# Patient Record
Sex: Female | Born: 1955 | Race: Black or African American | Hispanic: No | Marital: Married | Smoking: Former smoker
Health system: Southern US, Community
[De-identification: ages and names within clinical notes are randomized; demographics above are authoritative.]

## PROBLEM LIST (undated history)

## (undated) DIAGNOSIS — I10 Essential (primary) hypertension: Secondary | ICD-10-CM

## (undated) DIAGNOSIS — E119 Type 2 diabetes mellitus without complications: Secondary | ICD-10-CM

## (undated) DIAGNOSIS — M1990 Unspecified osteoarthritis, unspecified site: Secondary | ICD-10-CM

## (undated) HISTORY — PX: ABDOMINAL HYSTERECTOMY: SHX81

## (undated) HISTORY — PX: MYOMECTOMY ABDOMINAL APPROACH: SUR870

## (undated) HISTORY — DX: Type 2 diabetes mellitus without complications: E11.9

## (undated) HISTORY — PX: THYROID SURGERY: SHX805

## (undated) HISTORY — PX: OTHER SURGICAL HISTORY: SHX169

## (undated) MED FILL — Magnesium Sulfate IV Soln 4 GM/100ML (40 MG/ML): INTRAVENOUS | Qty: 100 | Status: AC

---

## 1999-06-22 ENCOUNTER — Other Ambulatory Visit: Admission: RE | Admit: 1999-06-22 | Discharge: 1999-06-22 | Payer: Self-pay | Admitting: Gynecology

## 1999-06-22 ENCOUNTER — Encounter (INDEPENDENT_AMBULATORY_CARE_PROVIDER_SITE_OTHER): Payer: Self-pay

## 1999-10-17 ENCOUNTER — Other Ambulatory Visit: Admission: RE | Admit: 1999-10-17 | Discharge: 1999-10-17 | Payer: Self-pay | Admitting: Gynecology

## 1999-10-17 ENCOUNTER — Encounter (INDEPENDENT_AMBULATORY_CARE_PROVIDER_SITE_OTHER): Payer: Self-pay | Admitting: Specialist

## 2003-03-08 ENCOUNTER — Ambulatory Visit (HOSPITAL_COMMUNITY): Admission: RE | Admit: 2003-03-08 | Discharge: 2003-03-09 | Payer: Self-pay

## 2003-03-08 ENCOUNTER — Encounter (INDEPENDENT_AMBULATORY_CARE_PROVIDER_SITE_OTHER): Payer: Self-pay | Admitting: Specialist

## 2006-02-06 ENCOUNTER — Emergency Department (HOSPITAL_COMMUNITY): Admission: EM | Admit: 2006-02-06 | Discharge: 2006-02-06 | Payer: Self-pay | Admitting: Emergency Medicine

## 2008-04-19 ENCOUNTER — Encounter (INDEPENDENT_AMBULATORY_CARE_PROVIDER_SITE_OTHER): Payer: Self-pay | Admitting: Obstetrics and Gynecology

## 2008-04-19 ENCOUNTER — Ambulatory Visit (HOSPITAL_COMMUNITY): Admission: RE | Admit: 2008-04-19 | Discharge: 2008-04-19 | Payer: Self-pay | Admitting: Obstetrics and Gynecology

## 2010-06-24 LAB — CBC
HCT: 41.7 % (ref 36.0–46.0)
Hemoglobin: 14.1 g/dL (ref 12.0–15.0)
Platelets: 281 10*3/uL (ref 150–400)
WBC: 7.8 10*3/uL (ref 4.0–10.5)

## 2010-07-22 NOTE — Op Note (Signed)
Lindsay [name], Lindsay [name]                ACCOUNT NO.:  [id number]   MEDICAL RECORD NO.:  [id number]          PATIENT TYPE:  AMB   LOCATION:  SDC                           FACILITY:  [facility]   PHYSICIAN:  Lenoard Aden, M.D.DATE OF BIRTH:  02-07-1956   DATE OF PROCEDURE:  04/19/2008  DATE OF DISCHARGE:                               OPERATIVE REPORT   PREOPERATIVE DIAGNOSIS:  Perimenopausal dysfunctional uterine bleeding.   POSTOPERATIVE DIAGNOSIS:  Perimenopausal dysfunctional uterine bleeding  plus large submucous fibroid x2.   PROCEDURE:  Diagnostic hysteroscopy, resectoscopic myomectomy, D and C.   SURGEON:  Lenoard Aden, MD   ANESTHESIA:  General and local.   ESTIMATED BLOOD LOSS:  Less than 50 mL.   COMPLICATIONS:  None.   DRAINS:  None.   COUNTS:  Correct.  The patient recovery in good condition.   FLUID DEFICIT:  200 mL.   DESCRIPTION OF PROCEDURE:  After being apprised of risks of anesthesia,  infection, bleeding, injury to abdominal organs, and need for repair  delayed versus immediate complications including uterine perforation,  possible need for repair.  The patient was brought to the operating  room.  She was administered general anesthetic without complications,  prepped and draped in the usual sterile fashion, catheterized until the  bladder was empty.  Exam under anesthesia reveals a bulky anteflexed  uterus.  No adnexal masses.  Dilute Pitressin solution placed at 3 and 9  o'clock, dilute solution 16 mL total.  Paracervical block was done using  dilute Marcaine solution placed at 4 and 8 o'clock in the standard  fashion.  Good hemostasis noted.  Cervix easily dilated up #29 Pratt  dilator scope placed.  Visualization reveals a large posterior right  lateral submucous fibroid and a large anterior submucous fibroid.  Both  resected with multiple passages using the right-angle loop down to level  of the endometrium.  Good hemostasis was noted.  No perforation  evidence  is noted.  Otherwise, normal cavity was obtained.  The endometrial  curettings was then collected using a D&C with a sharp curette and four  quadrant method.  Revisualization reveals a  normal endometrial cavity.  Good hemostasis noted.  No evidence of  uterine perforation.  Fluid deficit was noted.  All instruments removed.  The patient tolerates procedure well, awakened and transferred to  recovery in good condition.      Lenoard Aden, M.D.  Electronically Signed     RJT/MEDQ  D:  04/19/2008  T:  04/20/2008  Job:  [id number]

## 2010-07-25 NOTE — Op Note (Signed)
NAME:  Lindsay [name], Lindsay [name]                          ACCOUNT NO.:  [id number]   MEDICAL RECORD NO.:  [id number]                   PATIENT TYPE:  OIB   LOCATION:  2550                                 FACILITY:  [facility]   PHYSICIAN:  Lorre Munroe., M.D.            DATE OF BIRTH:  [birth year]   DATE OF PROCEDURE:  03/08/2003  DATE OF DISCHARGE:                                 OPERATIVE REPORT   PRE- AND POSTOPERATIVE DIAGNOSIS:  Probably benign right thyroid nodule.   OPERATION:  Right thyroid lobectomy.   SURGEON:  Lebron Conners, M.D.   ASSISTANT:  Gabrielle Dare. Janee Morn, M.D.   ANESTHESIA:  General.   PROCEDURE:  After the patient was monitored and anesthetized and had routine  preparation and draping of the neck, I made a collar incision about 2 cm  above the clavicle and sternal notch and dissected down through the  subcutaneous tissues and platysma muscle.  I then raised flaps beneath the  platysma to the thyroid cartilage and downward to the sternal notch.  I put  in a Mahorner retractor, and there was good visualization of the midline.  I  incised the strap muscles in the midline and noted the thyroid isthmus and  then felt the left lobe and found no nodularity.  Within the right lobe,  there was a very large nodule.  I dissected laterally on the anterior  surface of the thyroid gland, pulling it medially and dissected around the  large nodule and freed it up.  It was sticking out laterally and  posteriorly.  It was from the upper part of the gland but not from the upper  pole.  We dissected out the upper pole vessels and clipped them with clips  and divided them and rolled the gland further forward.  Staying very close  to the gland, I divided the inferior pole vessels as they entered the gland  after clipping.  I further reflected the gland medially, bluntly dissecting  in the area of the tracheoesophageal groove and saw the recurrent laryngeal  nerve and took care not to harm it.   I left a little bit of the thyroid  gland adjacent to the nerve because it was very densely adherent to the  thyroid.  There were two other nodules in the right lobe noted.  I dissected  the gland off of the trachea and then suture ligated the isthmus and divided  it.  Frozen section report was that this was a follicular lesion, either  hypoplastic nodule or an adenomatous nodule, nothing to suggest carcinoma.  The procedure was felt to be adequate at that point.  I irrigated the  incision and found hemostasis to be good.  I closed the midline with running  3-0 Vicryl and approximated the platysma with running 3-0 Vicryl and closed  the skin with interrupted intracuticular 4-0 Vicryl and Steri-Strips.  The  patient awakened nicely and  was able to speak well.  The vocal cords were  felt to be normal on extubation.  She went to the recovery room in stable  condition.                                               Lorre Munroe., M.D.    Jodi Marble  D:  03/08/2003  T:  03/08/2003  Job:  [id number]

## 2012-05-10 ENCOUNTER — Emergency Department (HOSPITAL_COMMUNITY)
Admission: EM | Admit: 2012-05-10 | Discharge: 2012-05-10 | Disposition: A | Discharge status: Home / Self Care | Payer: BC Managed Care – PPO | Source: Home / Self Care | Attending: Family Medicine | Admitting: Family Medicine

## 2012-05-10 ENCOUNTER — Emergency Department (INDEPENDENT_AMBULATORY_CARE_PROVIDER_SITE_OTHER): Payer: BC Managed Care – PPO

## 2012-05-10 ENCOUNTER — Encounter (HOSPITAL_COMMUNITY): Payer: Self-pay | Admitting: *Deleted

## 2012-05-10 DIAGNOSIS — M169 Osteoarthritis of hip, unspecified: Secondary | ICD-10-CM

## 2012-05-10 DIAGNOSIS — M1611 Unilateral primary osteoarthritis, right hip: Secondary | ICD-10-CM

## 2012-05-10 MED ORDER — KETOROLAC TROMETHAMINE 30 MG/ML IJ SOLN
30.0000 mg | Freq: Once | INTRAMUSCULAR | Status: AC
  Administered 2012-05-10: 30 mg via INTRAMUSCULAR

## 2012-05-10 MED ORDER — MELOXICAM 15 MG PO TABS: 15.0000 mg | ORAL_TABLET | Freq: Every day | ORAL | Status: DC

## 2012-05-10 MED ORDER — KETOROLAC TROMETHAMINE 30 MG/ML IJ SOLN
INTRAMUSCULAR | Status: AC
  Filled 2012-05-10: qty 1

## 2012-05-10 NOTE — ED Notes (Signed)
Pt reports right leg hurting ( entire length of leg) - no known injury or trauma - has gradually gotten worse but has been occuring for months - also reports a headache

## 2012-05-10 NOTE — ED Provider Notes (Signed)
History     CSN: [id number]  Arrival date & time 05/10/12  1104   First MD Initiated Contact with Patient 05/10/12 1222      Chief Complaint  Patient presents with  . Leg Pain    (Consider location/radiation/quality/duration/timing/severity/associated sxs/prior treatment) Patient is a 57 y.o. female presenting with leg pain. The history is provided by the patient.  Leg Pain Location:  Leg Time since incident:  7 months Leg location:  R upper leg and R lower leg Pain details:    Quality:  Dull   Severity:  Moderate   Onset quality:  Gradual   Progression:  Worsening (this am pain with walking.) Chronicity:  Chronic Dislocation: no   Prior injury to area:  No Relieved by:  NSAIDs Associated symptoms: no back pain     History reviewed. No pertinent past medical history.  Past Surgical History  Procedure Laterality Date  . Tyroid      Family History  Problem Relation Age of Onset  . Family history unknown: Yes    History  Substance Use Topics  . Smoking status: Current Every Day Smoker    Types: Cigarettes  . Smokeless tobacco: Not on file  . Alcohol Use: Yes     Comment: socially    OB History   Grav Para Term Preterm Abortions TAB SAB Ect Mult Living                  Review of Systems  Constitutional: Negative.   Gastrointestinal: Negative.   Genitourinary: Negative.   Musculoskeletal: Positive for gait problem. Negative for back pain.  Skin: Negative.     Allergies  Review of patient's allergies indicates no known allergies.  Home Medications  No current outpatient prescriptions on file.  BP 163/93  Pulse 74  Temp(Src) 99.2 F (37.3 C) (Oral)  SpO2 99%  Physical Exam  Nursing note and vitals reviewed. Constitutional: She is oriented to person, place, and time. She appears well-developed and well-nourished.  Musculoskeletal: She exhibits tenderness. She exhibits no edema.       Right hip: She exhibits decreased range of motion and  tenderness. She exhibits no swelling and no deformity.       Legs: Neurological: She is alert and oriented to person, place, and time.  Skin: Skin is warm and dry.    ED Course  Procedures (including critical care time)  Labs Reviewed - No data to display No results found.   No diagnosis found.    MDM  X-rays reviewed and report per radiologist.         Linna Hoff, MD 05/10/12 [phone]

## 2016-07-08 NOTE — Patient Instructions (Addendum)
Your procedure is scheduled on:  Thursday, Jul 16, 2016  Enter through the Main Entrance of [facility] at:  6:00 AM  Pick up the phone at the desk and dial [phone].  Call this number if you have problems the morning of surgery: [phone].  Remember: Do NOT eat food or drink after:  Midnight Wednesday  Take these medicines the morning of surgery with a SIP OF WATER:  Amlodipine, Hydrochlorothiazide, Atorvastatin  Stop ALL herbal medications at this time  Do NOT smoke the day of surgery.  Do NOT wear jewelry (body piercing), metal hair clips/bobby pins, make-up, or nail polish. Do NOT wear lotions, powders, or perfumes.  You may wear deodorant. Do NOT shave for 48 hours prior to surgery. Do NOT bring valuables to the hospital. Contacts, dentures, or bridgework may not be worn into surgery.  Leave suitcase in car.  After surgery it may be brought to your room.  For patients admitted to the hospital, checkout time is 11:00 AM the day of discharge.  Bring a copy of your healthcare power of attorney and living will documents.

## 2016-07-09 ENCOUNTER — Encounter (HOSPITAL_COMMUNITY)
Admission: RE | Admit: 2016-07-09 | Discharge: 2016-07-09 | Disposition: A | Discharge status: Home / Self Care | Payer: BLUE CROSS/BLUE SHIELD | Source: Ambulatory Visit | Attending: Obstetrics and Gynecology | Admitting: Obstetrics and Gynecology

## 2016-07-09 ENCOUNTER — Other Ambulatory Visit: Payer: Self-pay

## 2016-07-09 ENCOUNTER — Encounter (HOSPITAL_COMMUNITY): Payer: Self-pay

## 2016-07-09 DIAGNOSIS — N8189 Other female genital prolapse: Secondary | ICD-10-CM | POA: Diagnosis not present

## 2016-07-09 DIAGNOSIS — Z01812 Encounter for preprocedural laboratory examination: Secondary | ICD-10-CM | POA: Insufficient documentation

## 2016-07-09 HISTORY — DX: Unspecified osteoarthritis, unspecified site: M19.90

## 2016-07-09 HISTORY — DX: Essential (primary) hypertension: I10

## 2016-07-09 LAB — CBC
HEMATOCRIT: 41.3 % (ref 36.0–46.0)
Hemoglobin: 14.6 g/dL (ref 12.0–15.0)
MCH: 31.8 pg (ref 26.0–34.0)
MCHC: 35.4 g/dL (ref 30.0–36.0)
MCV: 90 fL (ref 78.0–100.0)
Platelets: 331 10*3/uL (ref 150–400)
RBC: 4.59 MIL/uL (ref 3.87–5.11)
RDW: 13.3 % (ref 11.5–15.5)
WBC: 6.6 10*3/uL (ref 4.0–10.5)

## 2016-07-09 LAB — COMPREHENSIVE METABOLIC PANEL
ALK PHOS: 71 U/L (ref 38–126)
ALT: 36 U/L (ref 14–54)
ANION GAP: 9 (ref 5–15)
AST: 29 U/L (ref 15–41)
Albumin: 4.1 g/dL (ref 3.5–5.0)
BILIRUBIN TOTAL: 0.3 mg/dL (ref 0.3–1.2)
BUN: 18 mg/dL (ref 6–20)
CALCIUM: 9.3 mg/dL (ref 8.9–10.3)
CO2: 24 mmol/L (ref 22–32)
Chloride: 100 mmol/L — ABNORMAL LOW (ref 101–111)
Creatinine, Ser: 0.62 mg/dL (ref 0.44–1.00)
GFR calc Af Amer: >60 mL/min (ref >60)
Glucose, Bld: 107 mg/dL — ABNORMAL HIGH (ref 65–99)
POTASSIUM: 3.5 mmol/L (ref 3.5–5.1)
Sodium: 133 mmol/L — ABNORMAL LOW (ref 135–145)
TOTAL PROTEIN: 8.2 g/dL — AB (ref 6.5–8.1)

## 2016-07-09 LAB — TYPE AND SCREEN
ABO/RH(D): O POS
Antibody Screen: NEGATIVE

## 2016-07-09 LAB — ABO/RH: ABO/RH(D): O POS

## 2016-07-13 NOTE — H&P (Signed)
Lindsay [name] is an 61 y.o. female G1P0 with symptomatic pelvic relaxation. Patient is uncomfortable with bladder protruding at vaginal introitus.  Pertinent Gynecological History: Menses: post-menopausal Bleeding: N/A Contraception: none DES exposure: denies Blood transfusions: none Sexually transmitted diseases: no past history Previous GYN Procedures: uterine myomectomy  Last mammogram: normal Date: unknown Last pap: normal Date: 2018 OB History: G1, P0   Menstrual History: Menarche age: unknown No LMP recorded. Patient is postmenopausal.    Past Medical History:  Diagnosis Date  . Arthritis   . Hypertension     Past Surgical History:  Procedure Laterality Date  . MYOMECTOMY ABDOMINAL APPROACH    . THYROID SURGERY    . tyroid      Family History  Problem Relation Age of Onset  . Family history unknown: Yes    Social History:  reports that she has been smoking Cigarettes.  She has a 4.50 pack-year smoking history. She has never used smokeless tobacco. She reports that she drinks alcohol. She reports that she does not use drugs.  Allergies: No Known Allergies  No prescriptions prior to admission.    ROS  There were no vitals taken for this visit. Physical Exam  Cardiovascular: Normal rate and regular rhythm.   Respiratory: Effort normal and breath sounds normal.  GI: Soft. There is no tenderness.  Genitourinary:  Genitourinary Comments: cystocele at vaginal introitus Uterus normal sized and mobile Adnexa without masses  Neurological: She has normal reflexes.    No results found for this or any previous visit (from the past 24 hour(s)).  No results found.  Assessment/Plan: 61 yo with symptomatic pelvic prolapse D/W patient LAVH/BSO/A&P repair/possible SSLS Risks reviewed including infection, organ damage, bleeding/transfusion-HIV/Hep, DVT/PE, pneumonia, laparotomy, pelvic pain, painful intercourse, laparotomy, fistula, recurrent prolapse. Patient  states she understands and agrees.  Lindsay [name],Lindsay [name] 07/13/2016, 9:43 AM

## 2016-07-15 NOTE — Anesthesia Preprocedure Evaluation (Addendum)
Anesthesia Evaluation  Patient identified by MRN, date of birth, ID band Patient awake    Reviewed: Allergy & Precautions, NPO status , Patient's Chart, lab work & pertinent test results  History of Anesthesia Complications Negative for: history of anesthetic complications  Airway Mallampati: II  TM Distance: >3 FB Neck ROM: Full    Dental no notable dental hx. (+) Dental Advisory Given   Pulmonary Current Smoker,    Pulmonary exam normal        Cardiovascular hypertension, Pt. on medications Normal cardiovascular exam     Neuro/Psych negative neurological ROS     GI/Hepatic negative GI ROS, Neg liver ROS,   Endo/Other  negative endocrine ROS  Renal/GU negative Renal ROS     Musculoskeletal negative musculoskeletal ROS (+)   Abdominal   Peds  Hematology negative hematology ROS (+)   Anesthesia Other Findings Day of surgery medications reviewed with the patient.  Reproductive/Obstetrics                            Anesthesia Physical Anesthesia Plan  ASA: II  Anesthesia Plan: General   Post-op Pain Management:    Induction: Intravenous  Airway Management Planned: Oral ETT  Additional Equipment:   Intra-op Plan:   Post-operative Plan: Extubation in OR  Informed Consent: I have reviewed the patients History and Physical, chart, labs and discussed the procedure including the risks, benefits and alternatives for the proposed anesthesia with the patient or authorized representative who has indicated his/her understanding and acceptance.   Dental advisory given  Plan Discussed with: CRNA and Anesthesiologist  Anesthesia Plan Comments:        Anesthesia Quick Evaluation

## 2016-07-16 ENCOUNTER — Observation Stay (HOSPITAL_COMMUNITY)
Admission: RE | Admit: 2016-07-16 | Discharge: 2016-07-18 | Disposition: A | Discharge status: Home / Self Care | Payer: BLUE CROSS/BLUE SHIELD | Source: Ambulatory Visit | Attending: Obstetrics and Gynecology | Admitting: Obstetrics and Gynecology

## 2016-07-16 ENCOUNTER — Encounter (HOSPITAL_COMMUNITY)
Admission: RE | Disposition: A | Discharge status: Home / Self Care | Payer: Self-pay | Source: Ambulatory Visit | Attending: Obstetrics and Gynecology

## 2016-07-16 ENCOUNTER — Observation Stay (HOSPITAL_COMMUNITY): Payer: BLUE CROSS/BLUE SHIELD | Admitting: Anesthesiology

## 2016-07-16 ENCOUNTER — Ambulatory Visit (HOSPITAL_COMMUNITY): Payer: BLUE CROSS/BLUE SHIELD | Admitting: Anesthesiology

## 2016-07-16 ENCOUNTER — Encounter (HOSPITAL_COMMUNITY): Payer: Self-pay

## 2016-07-16 DIAGNOSIS — N8111 Cystocele, midline: Secondary | ICD-10-CM

## 2016-07-16 DIAGNOSIS — I10 Essential (primary) hypertension: Secondary | ICD-10-CM | POA: Insufficient documentation

## 2016-07-16 DIAGNOSIS — D259 Leiomyoma of uterus, unspecified: Secondary | ICD-10-CM | POA: Diagnosis not present

## 2016-07-16 DIAGNOSIS — N8189 Other female genital prolapse: Principal | ICD-10-CM | POA: Insufficient documentation

## 2016-07-16 DIAGNOSIS — N819 Female genital prolapse, unspecified: Secondary | ICD-10-CM | POA: Diagnosis present

## 2016-07-16 DIAGNOSIS — F17210 Nicotine dependence, cigarettes, uncomplicated: Secondary | ICD-10-CM | POA: Diagnosis not present

## 2016-07-16 DIAGNOSIS — M1990 Unspecified osteoarthritis, unspecified site: Secondary | ICD-10-CM | POA: Insufficient documentation

## 2016-07-16 HISTORY — PX: CYSTOSCOPY: SHX5120

## 2016-07-16 HISTORY — PX: LAPAROSCOPIC VAGINAL HYSTERECTOMY WITH SALPINGECTOMY: SHX6680

## 2016-07-16 HISTORY — PX: ANTERIOR AND POSTERIOR REPAIR WITH SACROSPINOUS FIXATION: SHX6536

## 2016-07-16 SURGERY — HYSTERECTOMY, VAGINAL, LAPAROSCOPY-ASSISTED, WITH SALPINGECTOMY
Anesthesia: General | Site: Vagina

## 2016-07-16 MED ORDER — PROPOFOL 10 MG/ML IV BOLUS
INTRAVENOUS | Status: DC | PRN
Start: 2016-07-16 — End: 2016-07-16
  Administered 2016-07-16: 160 mg via INTRAVENOUS

## 2016-07-16 MED ORDER — ONDANSETRON HCL 4 MG/2ML IJ SOLN
4.0000 mg | Freq: Four times a day (QID) | INTRAMUSCULAR | Status: DC | PRN
Start: 2016-07-16 — End: 2016-07-17

## 2016-07-16 MED ORDER — SODIUM CHLORIDE 0.9 % IJ SOLN
INTRAMUSCULAR | Status: DC | PRN
  Administered 2016-07-16: 10 mL

## 2016-07-16 MED ORDER — ROCURONIUM BROMIDE 100 MG/10ML IV SOLN
INTRAVENOUS | Status: DC | PRN
  Administered 2016-07-16 (×2): 10 mg via INTRAVENOUS
  Administered 2016-07-16: 5 mg via INTRAVENOUS
  Administered 2016-07-16: 45 mg via INTRAVENOUS

## 2016-07-16 MED ORDER — BUPIVACAINE HCL (PF) 0.5 % IJ SOLN
INTRAMUSCULAR | Status: AC
  Filled 2016-07-16: qty 30

## 2016-07-16 MED ORDER — PROPOFOL 10 MG/ML IV BOLUS
INTRAVENOUS | Status: AC
  Filled 2016-07-16: qty 20

## 2016-07-16 MED ORDER — STERILE WATER FOR IRRIGATION IR SOLN
Status: DC | PRN
  Administered 2016-07-16: 1000 mL

## 2016-07-16 MED ORDER — LACTATED RINGERS IV SOLN
INTRAVENOUS | Status: DC
  Administered 2016-07-16 (×2): via INTRAVENOUS

## 2016-07-16 MED ORDER — ESTRADIOL 0.1 MG/GM VA CREA
TOPICAL_CREAM | VAGINAL | Status: AC
  Filled 2016-07-16: qty 42.5

## 2016-07-16 MED ORDER — ACETAMINOPHEN 10 MG/ML IV SOLN
INTRAVENOUS | Status: AC
  Administered 2016-07-16: 1000 mg via INTRAVENOUS
  Filled 2016-07-16: qty 100

## 2016-07-16 MED ORDER — OXYCODONE-ACETAMINOPHEN 5-325 MG PO TABS
1.0000 | ORAL_TABLET | ORAL | Status: DC | PRN
  Administered 2016-07-17 – 2016-07-18 (×4): 1 via ORAL
  Filled 2016-07-16 (×4): qty 1

## 2016-07-16 MED ORDER — HYDROMORPHONE HCL 1 MG/ML IJ SOLN
INTRAMUSCULAR | Status: DC | PRN
  Administered 2016-07-16 (×2): 0.5 mg via INTRAVENOUS

## 2016-07-16 MED ORDER — ROCURONIUM BROMIDE 100 MG/10ML IV SOLN
INTRAVENOUS | Status: AC
  Filled 2016-07-16: qty 2

## 2016-07-16 MED ORDER — MENTHOL 3 MG MT LOZG: 1.0000 | LOZENGE | OROMUCOSAL | Status: DC | PRN

## 2016-07-16 MED ORDER — MAGNESIUM HYDROXIDE 400 MG/5ML PO SUSP: 30.0000 mL | Freq: Every day | ORAL | Status: DC | PRN

## 2016-07-16 MED ORDER — BUPIVACAINE HCL (PF) 0.5 % IJ SOLN
INTRAMUSCULAR | Status: DC | PRN
  Administered 2016-07-16: 20 mL

## 2016-07-16 MED ORDER — FENTANYL CITRATE (PF) 250 MCG/5ML IJ SOLN
INTRAMUSCULAR | Status: AC
  Filled 2016-07-16: qty 5

## 2016-07-16 MED ORDER — DIPHENHYDRAMINE HCL 50 MG/ML IJ SOLN: 12.5000 mg | Freq: Four times a day (QID) | INTRAMUSCULAR | Status: DC | PRN

## 2016-07-16 MED ORDER — DEXAMETHASONE SODIUM PHOSPHATE 4 MG/ML IJ SOLN
INTRAMUSCULAR | Status: DC | PRN
  Administered 2016-07-16: 10 mg via INTRAVENOUS

## 2016-07-16 MED ORDER — ONDANSETRON HCL 4 MG PO TABS: 4.0000 mg | ORAL_TABLET | Freq: Four times a day (QID) | ORAL | Status: DC | PRN

## 2016-07-16 MED ORDER — AMLODIPINE BESY-BENAZEPRIL HCL 5-10 MG PO CAPS: 1.0000 | ORAL_CAPSULE | Freq: Every day | ORAL | Status: DC

## 2016-07-16 MED ORDER — SODIUM CHLORIDE 0.9% FLUSH: 9.0000 mL | INTRAVENOUS | Status: DC | PRN

## 2016-07-16 MED ORDER — ALUM & MAG HYDROXIDE-SIMETH 200-200-20 MG/5ML PO SUSP: 30.0000 mL | ORAL | Status: DC | PRN

## 2016-07-16 MED ORDER — SODIUM CHLORIDE 0.9 % IJ SOLN
INTRAMUSCULAR | Status: AC
  Filled 2016-07-16: qty 10

## 2016-07-16 MED ORDER — SUGAMMADEX SODIUM 200 MG/2ML IV SOLN
INTRAVENOUS | Status: DC | PRN
  Administered 2016-07-16: 165 mg via INTRAVENOUS

## 2016-07-16 MED ORDER — SCOPOLAMINE 1 MG/3DAYS TD PT72: 1.0000 | MEDICATED_PATCH | TRANSDERMAL | Status: DC

## 2016-07-16 MED ORDER — ONDANSETRON HCL 4 MG/2ML IJ SOLN
INTRAMUSCULAR | Status: AC
  Filled 2016-07-16: qty 2

## 2016-07-16 MED ORDER — GLYCOPYRROLATE 0.2 MG/ML IJ SOLN
INTRAMUSCULAR | Status: AC
  Filled 2016-07-16: qty 1

## 2016-07-16 MED ORDER — AMLODIPINE BESYLATE 5 MG PO TABS
5.0000 mg | ORAL_TABLET | Freq: Every day | ORAL | Status: DC
  Administered 2016-07-17: 5 mg via ORAL
  Filled 2016-07-16 (×2): qty 1

## 2016-07-16 MED ORDER — DIPHENHYDRAMINE HCL 12.5 MG/5ML PO ELIX
12.5000 mg | ORAL_SOLUTION | Freq: Four times a day (QID) | ORAL | Status: DC | PRN
  Filled 2016-07-16: qty 5

## 2016-07-16 MED ORDER — FENTANYL CITRATE (PF) 100 MCG/2ML IJ SOLN
INTRAMUSCULAR | Status: DC | PRN
  Administered 2016-07-16 (×2): 50 ug via INTRAVENOUS
  Administered 2016-07-16: 100 ug via INTRAVENOUS
  Administered 2016-07-16: 50 ug via INTRAVENOUS

## 2016-07-16 MED ORDER — MIDAZOLAM HCL 2 MG/2ML IJ SOLN
INTRAMUSCULAR | Status: AC
  Filled 2016-07-16: qty 2

## 2016-07-16 MED ORDER — MIDAZOLAM HCL 5 MG/5ML IJ SOLN
INTRAMUSCULAR | Status: DC | PRN
  Administered 2016-07-16 (×2): 1 mg via INTRAVENOUS

## 2016-07-16 MED ORDER — ESTRADIOL 0.1 MG/GM VA CREA
TOPICAL_CREAM | VAGINAL | Status: DC | PRN
  Administered 2016-07-16: 1 via VAGINAL

## 2016-07-16 MED ORDER — ACETAMINOPHEN 10 MG/ML IV SOLN
1000.0000 mg | Freq: Once | INTRAVENOUS | Status: AC
  Administered 2016-07-16: 1000 mg via INTRAVENOUS

## 2016-07-16 MED ORDER — HYDROMORPHONE 1 MG/ML IV SOLN
INTRAVENOUS | Status: DC
  Administered 2016-07-16: 1.4 mL via INTRAVENOUS
  Administered 2016-07-16: 2.6 mL via INTRAVENOUS
  Administered 2016-07-16: 12:00:00 via INTRAVENOUS
  Filled 2016-07-16: qty 25

## 2016-07-16 MED ORDER — LIDOCAINE HCL (CARDIAC) 20 MG/ML IV SOLN
INTRAVENOUS | Status: AC
  Filled 2016-07-16: qty 5

## 2016-07-16 MED ORDER — ONDANSETRON HCL 4 MG/2ML IJ SOLN
INTRAMUSCULAR | Status: DC | PRN
  Administered 2016-07-16: 4 mg via INTRAVENOUS

## 2016-07-16 MED ORDER — BENAZEPRIL HCL 10 MG PO TABS
10.0000 mg | ORAL_TABLET | Freq: Every day | ORAL | Status: DC
  Administered 2016-07-17: 10 mg via ORAL
  Filled 2016-07-16 (×2): qty 1

## 2016-07-16 MED ORDER — HYDROCHLOROTHIAZIDE 25 MG PO TABS
25.0000 mg | ORAL_TABLET | Freq: Every day | ORAL | Status: DC
  Administered 2016-07-17: 25 mg via ORAL
  Filled 2016-07-16 (×2): qty 1

## 2016-07-16 MED ORDER — LACTATED RINGERS IV SOLN
INTRAVENOUS | Status: DC
  Administered 2016-07-16 (×2): via INTRAVENOUS
  Administered 2016-07-16: 125 mL/h via INTRAVENOUS
  Administered 2016-07-16: 08:00:00 via INTRAVENOUS

## 2016-07-16 MED ORDER — DEXAMETHASONE SODIUM PHOSPHATE 10 MG/ML IJ SOLN
INTRAMUSCULAR | Status: AC
  Filled 2016-07-16: qty 1

## 2016-07-16 MED ORDER — LIDOCAINE-EPINEPHRINE 0.5 %-1:200000 IJ SOLN
INTRAMUSCULAR | Status: DC | PRN
  Administered 2016-07-16: 9 mL
  Administered 2016-07-16: 6 mL

## 2016-07-16 MED ORDER — HYDROMORPHONE HCL 1 MG/ML IJ SOLN
INTRAMUSCULAR | Status: AC
  Filled 2016-07-16: qty 1

## 2016-07-16 MED ORDER — PROMETHAZINE HCL 25 MG/ML IJ SOLN: 6.2500 mg | INTRAMUSCULAR | Status: DC | PRN

## 2016-07-16 MED ORDER — KETOROLAC TROMETHAMINE 30 MG/ML IJ SOLN
INTRAMUSCULAR | Status: AC
  Filled 2016-07-16: qty 1

## 2016-07-16 MED ORDER — IBUPROFEN 600 MG PO TABS
600.0000 mg | ORAL_TABLET | Freq: Four times a day (QID) | ORAL | Status: DC | PRN
  Administered 2016-07-17: 600 mg via ORAL
  Filled 2016-07-16: qty 1

## 2016-07-16 MED ORDER — CEFAZOLIN SODIUM-DEXTROSE 2-4 GM/100ML-% IV SOLN
2.0000 g | INTRAVENOUS | Status: AC
  Administered 2016-07-16: 2 g via INTRAVENOUS

## 2016-07-16 MED ORDER — HYDROMORPHONE HCL 1 MG/ML IJ SOLN: 0.2500 mg | INTRAMUSCULAR | Status: DC | PRN

## 2016-07-16 MED ORDER — SOD CITRATE-CITRIC ACID 500-334 MG/5ML PO SOLN
30.0000 mL | ORAL | Status: AC
  Administered 2016-07-16: 30 mL via ORAL

## 2016-07-16 MED ORDER — ONDANSETRON HCL 4 MG/2ML IJ SOLN: 4.0000 mg | Freq: Four times a day (QID) | INTRAMUSCULAR | Status: DC | PRN

## 2016-07-16 MED ORDER — SENNA 8.6 MG PO TABS
1.0000 | ORAL_TABLET | Freq: Two times a day (BID) | ORAL | Status: DC
  Administered 2016-07-16 – 2016-07-18 (×4): 8.6 mg via ORAL
  Filled 2016-07-16 (×5): qty 1

## 2016-07-16 MED ORDER — NALOXONE HCL 0.4 MG/ML IJ SOLN: 0.4000 mg | INTRAMUSCULAR | Status: DC | PRN

## 2016-07-16 MED ORDER — HEPARIN SODIUM (PORCINE) 5000 UNIT/ML IJ SOLN
INTRAMUSCULAR | Status: AC
  Filled 2016-07-16: qty 1

## 2016-07-16 MED ORDER — LIDOCAINE HCL (CARDIAC) 20 MG/ML IV SOLN
INTRAVENOUS | Status: DC | PRN
  Administered 2016-07-16 (×2): 50 mg via INTRAVENOUS

## 2016-07-16 MED ORDER — LIDOCAINE-EPINEPHRINE 0.5 %-1:200000 IJ SOLN
INTRAMUSCULAR | Status: AC
  Filled 2016-07-16: qty 1

## 2016-07-16 MED ORDER — SOD CITRATE-CITRIC ACID 500-334 MG/5ML PO SOLN
ORAL | Status: AC
  Administered 2016-07-16: 30 mL via ORAL
  Filled 2016-07-16: qty 15

## 2016-07-16 SURGICAL SUPPLY — 71 items
ADH SKN CLS APL DERMABOND .7 (GAUZE/BANDAGES/DRESSINGS) ×3
BLADE SURG 11 STRL SS (BLADE) IMPLANT
BLADE SURG 15 STRL LF C SS BP (BLADE) ×3 IMPLANT
BLADE SURG 15 STRL SS (BLADE) ×5
CABLE HIGH FREQUENCY MONO STRZ (ELECTRODE) IMPLANT
CATH ROBINSON RED A/P 16FR (CATHETERS) ×5 IMPLANT
CLOTH BEACON ORANGE TIMEOUT ST (SAFETY) ×5 IMPLANT
CONT PATH 16OZ SNAP LID 3702 (MISCELLANEOUS) ×5 IMPLANT
COVER BACK TABLE 60X90IN (DRAPES) ×5 IMPLANT
DECANTER SPIKE VIAL GLASS SM (MISCELLANEOUS) ×7 IMPLANT
DERMABOND ADVANCED (GAUZE/BANDAGES/DRESSINGS) ×2
DERMABOND ADVANCED .7 DNX12 (GAUZE/BANDAGES/DRESSINGS) ×3 IMPLANT
DEVICE CAPIO SLIM SINGLE (INSTRUMENTS) ×5 IMPLANT
DISSECTOR SPONGE CHERRY (GAUZE/BANDAGES/DRESSINGS) IMPLANT
DRSG OPSITE POSTOP 3X4 (GAUZE/BANDAGES/DRESSINGS) ×2 IMPLANT
DURAPREP 26ML APPLICATOR (WOUND CARE) ×5 IMPLANT
ELECT REM PT RETURN 9FT ADLT (ELECTROSURGICAL) ×5
ELECTRODE REM PT RTRN 9FT ADLT (ELECTROSURGICAL) IMPLANT
FILTER SMOKE EVAC LAPAROSHD (FILTER) ×5 IMPLANT
GAUZE PACKING 1 X5 YD ST (GAUZE/BANDAGES/DRESSINGS) ×5 IMPLANT
GAUZE PACKING 1/2X5YD (GAUZE/BANDAGES/DRESSINGS) ×2 IMPLANT
GAUZE SPONGE 4X4 16PLY XRAY LF (GAUZE/BANDAGES/DRESSINGS) ×5 IMPLANT
GLOVE BIO SURGEON STRL SZ8 (GLOVE) ×10 IMPLANT
GLOVE BIOGEL PI IND STRL 6.5 (GLOVE) ×3 IMPLANT
GLOVE BIOGEL PI IND STRL 7.0 (GLOVE) ×6 IMPLANT
GLOVE BIOGEL PI IND STRL 8 (GLOVE) ×3 IMPLANT
GLOVE BIOGEL PI INDICATOR 6.5 (GLOVE) ×2
GLOVE BIOGEL PI INDICATOR 7.0 (GLOVE) ×4
GLOVE BIOGEL PI INDICATOR 8 (GLOVE) ×2
GOWN STRL REUS W/ TWL XL LVL3 (GOWN DISPOSABLE) ×6 IMPLANT
GOWN STRL REUS W/TWL LRG LVL3 (GOWN DISPOSABLE) ×20 IMPLANT
GOWN STRL REUS W/TWL XL LVL3 (GOWN DISPOSABLE) ×10
LEGGING LITHOTOMY PAIR STRL (DRAPES) ×5 IMPLANT
LIGASURE IMPACT 36 18CM CVD LR (INSTRUMENTS) ×2 IMPLANT
NDL HYPO 25X5/8 SAFETYGLIDE (NEEDLE) ×3 IMPLANT
NDL MAYO CATGUT SZ4 TPR NDL (NEEDLE) ×3 IMPLANT
NEEDLE HYPO 22GX1.5 SAFETY (NEEDLE) ×2 IMPLANT
NEEDLE HYPO 25X5/8 SAFETYGLIDE (NEEDLE) ×5 IMPLANT
NEEDLE INSUFFLATION 120MM (ENDOMECHANICALS) ×5 IMPLANT
NEEDLE MAYO CATGUT SZ4 (NEEDLE) ×5 IMPLANT
NS IRRIG 1000ML POUR BTL (IV SOLUTION) ×5 IMPLANT
PACK LAVH (CUSTOM PROCEDURE TRAY) ×5 IMPLANT
PACK ROBOTIC GOWN (GOWN DISPOSABLE) ×5 IMPLANT
PACK TRENDGUARD 450 HYBRID PRO (MISCELLANEOUS) IMPLANT
PACK VAGINAL WOMENS (CUSTOM PROCEDURE TRAY) ×5 IMPLANT
PLUG CATH AND CAP STER (CATHETERS) IMPLANT
PROTECTOR NERVE ULNAR (MISCELLANEOUS) ×10 IMPLANT
SCISSORS LAP 5X45 EPIX DISP (ENDOMECHANICALS) IMPLANT
SEALER TISSUE G2 CVD JAW 45CM (ENDOMECHANICALS) ×5 IMPLANT
SET CYSTO W/LG BORE CLAMP LF (SET/KITS/TRAYS/PACK) ×2 IMPLANT
SET IRRIG TUBING LAPAROSCOPIC (IRRIGATION / IRRIGATOR) IMPLANT
SLEEVE XCEL OPT CAN 5 100 (ENDOMECHANICALS) ×2 IMPLANT
SUT CAPIO POLYGLYCOLIC (SUTURE) ×5 IMPLANT
SUT MNCRL 0 MO-4 VIOLET 18 CR (SUTURE) ×6 IMPLANT
SUT MNCRL 0 VIOLET 6X18 (SUTURE) ×3 IMPLANT
SUT MON AB 2-0 CT1 27 (SUTURE) ×10 IMPLANT
SUT MONOCRYL 0 6X18 (SUTURE) ×2
SUT MONOCRYL 0 MO 4 18  CR/8 (SUTURE) ×4
SUT PLAIN 2 0 (SUTURE)
SUT PLAIN ABS 2-0 54XMFL TIE (SUTURE) IMPLANT
SUT VIC AB 2-0 CT2 27 (SUTURE) ×15 IMPLANT
SUT VIC AB 2-0 UR6 27 (SUTURE) ×10 IMPLANT
SUT VIC AB 4-0 PS2 27 (SUTURE) ×5 IMPLANT
SUT VICRYL 0 UR6 27IN ABS (SUTURE) ×5 IMPLANT
SYR 30ML LL (SYRINGE) ×5 IMPLANT
TOWEL OR 17X24 6PK STRL BLUE (TOWEL DISPOSABLE) ×10 IMPLANT
TRAY FOLEY CATH SILVER 14FR (SET/KITS/TRAYS/PACK) ×5 IMPLANT
TRENDGUARD 450 HYBRID PRO PACK (MISCELLANEOUS) ×5
TROCAR XCEL NON-BLD 11X100MML (ENDOMECHANICALS) ×5 IMPLANT
TROCAR XCEL NON-BLD 5MMX100MML (ENDOMECHANICALS) ×5 IMPLANT
WARMER LAPAROSCOPE (MISCELLANEOUS) ×5 IMPLANT

## 2016-07-16 NOTE — Progress Notes (Signed)
C/O some mild abdominal cramping No ambulation yet  Vitals:   07/16/16 1449 07/16/16 1600  BP: 133/66 (!) 142/72  Pulse: 82 86  Resp: 11 11  Temp: 98.1 F (36.7 C) 98.2 F (36.8 C)  Lungs CTA Cor RRR Abdomen soft, BS+ LE PAS on UO clear  A/P Stable       Ambulate

## 2016-07-16 NOTE — Brief Op Note (Signed)
07/16/2016  4:69 AM  PATIENT:  Margarette Asal  61 y.o. female  PRE-OPERATIVE DIAGNOSIS:  pelvic prolapse  POST-OPERATIVE DIAGNOSIS:  pelvic prolapse  PROCEDURE:  Procedure(s): LAPAROSCOPIC ASSISTED VAGINAL HYSTERECTOMY WITH SALPINGECTOMY (Bilateral) ANTERIOR AND POSTERIOR REPAIR WITH SACROSPINOUS FIXATION (N/A) CYSTOSCOPY  And bilateral oophorectomy  SURGEON:  Surgeon(s) and Role:    * Everlene Farrier, MD - Primary    * Maisie Fus, MD - Assisting  PHYSICIAN ASSISTANT:   ASSISTANTS: none   ANESTHESIA:   general  EBL:  Total I/O In: 1000 [I.V.:1000] Out: 300 [Urine:250; Blood:50]  BLOOD ADMINISTERED:none  DRAINS: Urinary Catheter (Foley)   LOCAL MEDICATIONS USED:  MARCAINE    and LIDOCAINE   SPECIMEN:  Source of Specimen:  uterus , bilateral tubes/ovaries  DISPOSITION OF SPECIMEN:  PATHOLOGY  COUNTS:  YES  TOURNIQUET:  * No tourniquets in log *  DICTATION: .[id number]  PLAN OF CARE: Other Dictation: Dictation Number [id number]  PATIENT DISPOSITION:  PACU - hemodynamically stable.   Delay start of Pharmacological VTE agent (>24hrs) due to surgical blood loss or risk of bleeding: not applicable

## 2016-07-16 NOTE — Anesthesia Procedure Notes (Signed)
Procedure Name: Intubation Date/Time: 07/16/2016 7:39 AM Performed by: Vernice Jefferson Pre-anesthesia Checklist: Patient identified, Emergency Drugs available, Suction available, Patient being monitored and Timeout performed Patient Re-evaluated:Patient Re-evaluated prior to inductionOxygen Delivery Method: Circle system utilized Preoxygenation: Pre-oxygenation with 100% oxygen Intubation Type: IV induction Laryngoscope Size: Mac and 3 Grade View: Grade I Tube type: Oral Tube size: 7.0 mm Number of attempts: 1 Airway Equipment and Method: Stylet Placement Confirmation: ETT inserted through vocal cords under direct vision,  positive ETCO2 and breath sounds checked- equal and bilateral Secured at: 21 cm Tube secured with: Tape Dental Injury: Teeth and Oropharynx as per pre-operative assessment

## 2016-07-16 NOTE — Progress Notes (Signed)
No change to H&P per patient history Reviewed with patient procedure-LAVH/BSO/A&P repair/possible SSLS Patient states she understands and agrees

## 2016-07-16 NOTE — Anesthesia Postprocedure Evaluation (Addendum)
Anesthesia Post Note  Patient: Lindsay [name]  Procedure(s) Performed: Procedure(s) (LRB): LAPAROSCOPIC ASSISTED VAGINAL HYSTERECTOMY WITH SALPINGECTOMY (Bilateral) ANTERIOR AND POSTERIOR REPAIR WITH SACROSPINOUS FIXATION (N/A) CYSTOSCOPY  Patient location during evaluation: PACU Anesthesia Type: General Level of consciousness: sedated Pain management: pain level controlled Vital Signs Assessment: post-procedure vital signs reviewed and stable Respiratory status: spontaneous breathing and respiratory function stable Cardiovascular status: stable Anesthetic complications: no        Last Vitals:  Vitals:   07/16/16 1250 07/16/16 1348  BP: 129/81 117/65  Pulse: 78 77  Resp: 12 12  Temp:  36.6 C    Last Pain:  Vitals:   07/16/16 1348  TempSrc: Oral  PainSc:    Pain Goal: Patients Stated Pain Goal: 3 (07/16/16 1145)               [place]

## 2016-07-16 NOTE — Transfer of Care (Signed)
Immediate Anesthesia Transfer of Care Note  Patient: Lindsay [name]  Procedure(s) Performed: Procedure(s): LAPAROSCOPIC ASSISTED VAGINAL HYSTERECTOMY WITH SALPINGECTOMY (Bilateral) ANTERIOR AND POSTERIOR REPAIR WITH SACROSPINOUS FIXATION (N/A) CYSTOSCOPY  Patient Location: PACU  Anesthesia Type:General  Level of Consciousness: awake, alert  and oriented  Airway & Oxygen Therapy: Patient Spontanous Breathing and Patient connected to nasal cannula oxygen  Post-op Assessment: Report given to RN and Post -op Vital signs reviewed and stable  Post vital signs: Reviewed and stable  Last Vitals:  Vitals:   07/16/16 0606 07/16/16 1000  BP: 114/74 122/80  Pulse: 63 85  Resp: 16 16  Temp: 36.8 C 36.7 C    Last Pain:  Vitals:   07/16/16 0606  TempSrc: Oral      Patients Stated Pain Goal: 3 (62/44/69 5072)  Complications: No apparent anesthesia complications

## 2016-07-17 ENCOUNTER — Encounter (HOSPITAL_COMMUNITY): Payer: Self-pay | Admitting: Obstetrics and Gynecology

## 2016-07-17 DIAGNOSIS — N8189 Other female genital prolapse: Secondary | ICD-10-CM | POA: Diagnosis not present

## 2016-07-17 LAB — CBC
HCT: 35.6 % — ABNORMAL LOW (ref 36.0–46.0)
Hemoglobin: 12.3 g/dL (ref 12.0–15.0)
MCH: 31.2 pg (ref 26.0–34.0)
MCHC: 34.6 g/dL (ref 30.0–36.0)
MCV: 90.4 fL (ref 78.0–100.0)
PLATELETS: 320 10*3/uL (ref 150–400)
RBC: 3.94 MIL/uL (ref 3.87–5.11)
RDW: 13.2 % (ref 11.5–15.5)
WBC: 15.6 10*3/uL — AB (ref 4.0–10.5)

## 2016-07-17 NOTE — Op Note (Signed)
NAME:  OPAL, DINNING                     ACCOUNT NO.:  MEDICAL RECORD NO.:  [id number]  LOCATION:                                 FACILITY:  PHYSICIAN:  Daleen Bo. Gaetano Net, M.D.      DATE OF BIRTH:  DATE OF PROCEDURE:  07/16/2016 DATE OF DISCHARGE:                              OPERATIVE REPORT   PREOPERATIVE DIAGNOSIS:  Pelvic prolapse.  POSTOPERATIVE DIAGNOSIS:  Pelvic prolapse.  PROCEDURE:  Laparoscopically-assisted vaginal hysterectomy with bilateral salpingo-oophorectomy, anterior-posterior vaginal repair, sacrospinous ligament suspension, and cystoscopy.  SURGEON:  Daleen Bo. Gaetano Net, M.D.  ASSISTANTMaisie Fus, M.D.  ANESTHESIA:  General with endotracheal intubation.  ESTIMATED BLOOD LOSS:  50 mL.  SPECIMENS:  Uterus, bilateral fallopian tubes, and ovaries to Pathology.  DRAINS:  Foley catheter.  Vaginal pack is in place.  INDICATIONS AND CONSENT:  This patient is a 61 year old patient with symptomatic pelvic prolapse.  Details are dictated in history and physical.  Laparoscopically assisted vaginal hysterectomy with bilateral salpingo-oophorectomy, anterior-posterior repair, and sacrospinous ligament suspension have been discussed preoperatively.  Potential risks and complications were discussed preoperatively including, but not limited to, infection, organ damage, bleeding requiring transfusion of blood products with HIV and hepatitis acquisition, DVT, PE, pneumonia, fistula formation, pelvic pain, abdominal pain, perineal pain, laparotomy, and return to the operating room.  Possible recurrence of prolapse has also been emphasized.  The patient states she understands and agrees and consent was signed on the chart.  FINDINGS:  Upper abdomen is grossly normal.  Uterus is 6 weeks in size, smooth in contour.  Anterior and posterior cul-de-sacs are clear, and the tubes and ovaries are normal bilaterally.  DESCRIPTION OF PROCEDURE:  The patient was taken to the  operating room where she was identified, placed in a dorsal supine position and general anesthesia was induced via endotracheal intubation.  She was placed in a dorsal lithotomy position.  Time-out is undertaken.  She was prepped abdominally with Hibiclens, vaginally with Betadine.  Straight catheterized.  Hulka tenaculum was placed in the uterus as a manipulator and after 3 minute drying time, she was draped in a sterile fashion. The infraumbilical and suprapubic areas were injected in the midline with 0.5% plain Marcaine.  A small infraumbilical incision was made, and a disposable Veress needle was placed.  A good syringe and drop test were noted, and 2 L of gas were insufflated under low pressure with good tympany in the right upper quadrant.  The Veress needle was removed and a 10/11 Xcel bladeless disposable trocar sleeve was placed using the diagnostic laparoscope.  The operative scope was then used.  A small suprapubic incision was made in the midline, and a 5-mm disposable trocar sleeve was placed under direct visualization without difficulty. The above findings were noted.  Then, using the EnSeal bipolar cautery cutting instrument, the right infundibulopelvic ligament was taken down coming across the round ligament down the level of vesicouterine peritoneum.  Similar procedure was carried out on the left side.  The vesicouterine peritoneal reflection was taken down as well.  Good hemostasis was noted.  Instruments and suprapubic trocar sleeve were removed, and attention was  turned to the vagina.  Posterior cul-de-sac was entered sharply, and the cervix was circumscribed with unipolar cautery.  Mucosa was advanced sharply and bluntly.  Then, using the LigaSure bipolar cautery cutting instrument, the uterosacral ligaments were taken down bilaterally, followed by the bladder pillars, cardinal ligaments, and uterine vessels.  Anterior cul-de-sac was entered without difficulty.   Fundus and the tubes and ovaries delivered posteriorly and the remaining pedicles were taken down.  All suture will be 0 Monocryl unless otherwise designated.  Uterosacral ligaments were plicated the cuff bilaterally and then plicated the midline with a third suture. Posterior half of the cuff was closed with interrupted 0 Monocryl.  The anterior vaginal mucosa was then injected with 1% lidocaine with 1:200,000 epinephrine.  The anterior vaginal mucosa was then taken down in the midline to a point about 2 cm from the urethral meatus.  The mucosa was then dissected from the underlying bladder sharply and bluntly.  It was reduced with pursestring sutures of 0 Monocryl, followed by interrupted sutures to plicate the tissues in the midline. This elevated the bladder well.  Excess mucosa was trimmed.  2-0 Vicryl suture was then used to close the mucosa in a running locking fashion, which was also used to close the anterior half of the vaginal cuff.  A shallow elongated diamond-shaped wedge of tissue was removed from the 6 o'clock position of the vestibule.  Posterior mucosa was injected with the same 1% lidocaine with 1:200,000 epinephrine solution.  The posterior mucosa was taken down in the midline to about 1-2 cm below the vaginal cuff.  It was then widely dissected sharply and bluntly.  The right sacrospinous ligament was then carefully identified.  Then, using the Capio needle driver, 1-2 fingerbreadths medial to the ischial spine, a 0 Vicryl suture was placed through the sacrospinous ligament.  Both of the barrel needles are then retrieved.  Then, using a free needle, it was plicated through the posterior aspect of the vaginal mucosa at the level of the right vaginal fornix.  The suture was then held.  The rectovaginal fascia was then plicated in midline with interrupted 0 Monocryl sutures.  A small amount of vaginal mucosa was trimmed and the upper third of the posterior vaginal mucosa  was closed in running locking fashion with 2-0 Vicryl suture which was then held.  The sacrospinous suspension stitch was then tied down giving good elevation to the vaginal cuff.  The remainder of the mucosa and the perineal tissues were closed in a running locking fashion with 2-0 Vicryl. Cystoscopy was then carried out with a 70-degree cystoscope.  A 360- degree inspection revealed the bladder to be intact and no foreign bodies and a good puff of urine from the ureters bilaterally. Cystoscope was removed.  Foley catheter was placed.  Vagina was packed with vaginal packing with estrogen cream.  Attention was returned to the abdomen.  Pneumoperitoneum was re-created, and the 5 mm suprapubic trocar sleeve was reintroduced under direct visualization.  Irrigation was carried out.  Careful inspection revealed excellent hemostasis all around.  The remaining approximately 25 mL of 0.5% plain Marcaine was instilled in the peritoneal cavity.  Suprapubic trocar sleeve was removed, pneumoperitoneum was reduced, the umbilical trocar sleeve was removed.  The umbilical incision was closed with 0 Vicryl under good visualization in the subcutaneous layer, and the skin on both incisions was closed with interrupted 4-0 Vicryl.  Dermabond was placed on both. All counts were correct.  The patient was awakened and  taken to recovery room in stable condition.     Daleen Bo Gaetano Net, M.D.    JET/MEDQ  D:  07/16/2016  T:  07/16/2016  Job:  [id number]

## 2016-07-17 NOTE — Progress Notes (Signed)
Foley and packing out No flatus or void yet Pain gone Hungry, walked to BR  Vitals:   07/17/16 0210 07/17/16 0829  BP:  121/63  Pulse:  75  Resp: 14 16  Temp:  97.6 F (36.4 C)   Abdomen soft with good BS  Results for orders placed or performed during the hospital encounter of 07/16/16 (from the past 24 hour(s))  CBC     Status: Abnormal   Collection Time: 07/17/16  5:42 AM  Result Value Ref Range   WBC 15.6 (H) 4.0 - 10.5 K/uL   RBC 3.94 3.87 - 5.11 MIL/uL   Hemoglobin 12.3 12.0 - 15.0 g/dL   HCT 35.6 (L) 36.0 - 46.0 %   MCV 90.4 78.0 - 100.0 fL   MCH 31.2 26.0 - 34.0 pg   MCHC 34.6 30.0 - 36.0 g/dL   RDW 13.2 11.5 - 15.5 %   Platelets 320 150 - 400 K/uL    A/P: Satisfactory but no flatus or void yet         Has had crackers but no other food yet         Advance diet, ambulate         Probable D/C tomorrow

## 2016-07-18 DIAGNOSIS — N8189 Other female genital prolapse: Secondary | ICD-10-CM | POA: Diagnosis not present

## 2016-07-18 MED ORDER — IBUPROFEN 600 MG PO TABS: 600.0000 mg | ORAL_TABLET | Freq: Four times a day (QID) | ORAL | 0 refills | Status: DC | PRN

## 2016-07-18 MED ORDER — OXYCODONE-ACETAMINOPHEN 5-325 MG PO TABS: 1.0000 | ORAL_TABLET | Freq: Four times a day (QID) | ORAL | 0 refills | Status: DC | PRN

## 2016-07-18 NOTE — Progress Notes (Signed)
POD #2 Tolerating regular diet, + flatus, good pain relief  VSS Afeb Abdomen soft  A/P: D/C home         Instructions given

## 2016-07-18 NOTE — Discharge Summary (Signed)
Physician Discharge Summary  Patient ID: MCKENA CHERN MRN: [id number] DOB/AGE: 1956-02-05 61 y.o.  Admit date: 07/16/2016 Discharge date: 07/18/2016  Admission Diagnoses:Pelvic Prolapse  Discharge Diagnoses:  Active Problems:   Pelvic prolapse   Discharged Condition: good  Hospital Course: good resumption of bowel/bladder function, ambulating well with good pain relief.  Consults: None  Significant Diagnostic Studies: labs:  Results for orders placed or performed during the hospital encounter of 07/16/16 (from the past 48 hour(s))  CBC     Status: Abnormal   Collection Time: 07/17/16  5:42 AM  Result Value Ref Range   WBC 15.6 (H) 4.0 - 10.5 K/uL   RBC 3.94 3.87 - 5.11 MIL/uL   Hemoglobin 12.3 12.0 - 15.0 g/dL   HCT 35.6 (L) 36.0 - 46.0 %   MCV 90.4 78.0 - 100.0 fL   MCH 31.2 26.0 - 34.0 pg   MCHC 34.6 30.0 - 36.0 g/dL   RDW 13.2 11.5 - 15.5 %   Platelets 320 150 - 400 K/uL    Treatments: surgery: LAVH/BSO/A&P repair/SSLS  Discharge Exam: Blood pressure 104/70, pulse 68, temperature 98.5 F (36.9 C), temperature source Oral, resp. rate 18, height 5\' 6"  (1.676 m), weight 182 lb 3.2 oz (82.6 kg), SpO2 93 %. General appearance: alert, cooperative and no distress GI: soft, non-tender; bowel sounds normal; no masses,  no organomegaly  Disposition: 01-Home or Self Care  Discharge Instructions    Discharge patient    Complete by:  As directed    Discharge disposition:  01-Home or Self Care   Discharge patient date:  07/18/2016     Allergies as of 07/18/2016   No Known Allergies     Medication List    STOP taking these medications   ferrous sulfate 325 (65 FE) MG tablet     TAKE these medications   acetaminophen 650 MG CR tablet Commonly known as:  TYLENOL Take 1,300 mg by mouth 2 (two) times daily as needed for pain.   amLODipine-benazepril 5-10 MG capsule Commonly known as:  LOTREL Take 1 capsule by mouth daily.   atorvastatin 80 MG tablet Commonly  known as:  LIPITOR Take 80 mg by mouth daily.   hydrochlorothiazide 25 MG tablet Commonly known as:  HYDRODIURIL Take 25 mg by mouth daily.   ibuprofen 600 MG tablet Commonly known as:  ADVIL,MOTRIN Take 1 tablet (600 mg total) by mouth every 6 (six) hours as needed (mild pain).   oxyCODONE-acetaminophen 5-325 MG tablet Commonly known as:  PERCOCET/ROXICET Take 1-2 tablets by mouth every 6 (six) hours as needed (moderate to severe pain (when tolerating fluids)).   Vitamin D3 1000 units Caps Take 1,000 Units by mouth daily.        Signed: Joyanne Eddinger II,Kamalani Mastro E 07/18/2016, 8:59 AM

## 2016-07-18 NOTE — Progress Notes (Signed)
Discharge instructions reviewed with patient.  Patient states understanding of home care, medications, activity, signs/symptoms to report to MD and return MD office visit.  Patients significant other and family will assist with her care @ home.  No home  equipment needed, patient has prescriptions and all personal belongings.  Patient discharged via wheelchair in stable condition with staff without incident.  

## 2016-08-08 NOTE — Addendum Note (Signed)
Addendum  created 08/08/16 0956 by Duane Boston, MD   Sign clinical note

## 2016-09-24 DIAGNOSIS — Z96641 Presence of right artificial hip joint: Secondary | ICD-10-CM | POA: Insufficient documentation

## 2017-06-29 DIAGNOSIS — M25552 Pain in left hip: Secondary | ICD-10-CM | POA: Insufficient documentation

## 2017-08-06 DIAGNOSIS — M1612 Unilateral primary osteoarthritis, left hip: Secondary | ICD-10-CM | POA: Insufficient documentation

## 2018-08-31 ENCOUNTER — Encounter: Payer: Self-pay | Admitting: *Deleted

## 2019-05-05 ENCOUNTER — Ambulatory Visit: Payer: Medicare Other | Attending: Internal Medicine

## 2019-05-05 DIAGNOSIS — Z23 Encounter for immunization: Secondary | ICD-10-CM

## 2019-05-05 NOTE — Progress Notes (Signed)
   Covid-19 Vaccination Clinic  Name:  Lindsay [name]    MRN: [id number] DOB: [birth year]  05/05/2019  [name] was observed post Covid-19 immunization for 15 minutes without incidence. She was provided with Vaccine Information Sheet and instruction to access the V-Safe system.   [name] was instructed to call 911 with any severe reactions post vaccine: Marland Kitchen Difficulty breathing  . Swelling of your face and throat  . A fast heartbeat  . A bad rash all over your body  . Dizziness and weakness    Immunizations Administered    Name Date Dose VIS Date Route   Pfizer COVID-19 Vaccine 05/05/2019 10:22 AM 0.3 mL 02/17/2019 Intramuscular   Manufacturer: [place]   Lot: [id number]   [place]: [id number]

## 2019-05-25 ENCOUNTER — Other Ambulatory Visit: Payer: Self-pay | Admitting: Internal Medicine

## 2019-05-25 DIAGNOSIS — Z1231 Encounter for screening mammogram for malignant neoplasm of breast: Secondary | ICD-10-CM

## 2019-05-30 ENCOUNTER — Ambulatory Visit: Payer: Medicare Other | Attending: Internal Medicine

## 2019-05-30 DIAGNOSIS — Z23 Encounter for immunization: Secondary | ICD-10-CM

## 2019-05-30 NOTE — Progress Notes (Signed)
   Covid-19 Vaccination Clinic  Name:  Lindsay [name]    MRN: [id number] DOB: 29-Dec-1955  05/30/2019  [name] was observed post Covid-19 immunization for 15 minutes without incident. She was provided with Vaccine Information Sheet and instruction to access the V-Safe system.   [name] was instructed to call 911 with any severe reactions post vaccine: Marland Kitchen Difficulty breathing  . Swelling of face and throat  . A fast heartbeat  . A bad rash all over body  . Dizziness and weakness   Immunizations Administered    Name Date Dose VIS Date Route   Pfizer COVID-19 Vaccine 05/30/2019 12:36 PM 0.3 mL 02/17/2019 Intramuscular   Manufacturer: [place]   Lot: [id number]   [place]: [id number]

## 2019-06-07 ENCOUNTER — Encounter: Payer: Self-pay | Admitting: Internal Medicine

## 2019-06-12 ENCOUNTER — Other Ambulatory Visit: Payer: Self-pay | Admitting: Radiology

## 2019-06-20 ENCOUNTER — Other Ambulatory Visit: Payer: Self-pay | Admitting: Hematology

## 2019-07-25 ENCOUNTER — Encounter: Payer: Self-pay | Admitting: Gastroenterology

## 2019-07-25 NOTE — Progress Notes (Signed)
Received paperwork for review in regards to a referral for an EMR attempt.  Patient evaluated on June 07, 2019 by Dr. Benson Norway and Dr. Collene Mares. Patient was having abdominal pain bilaterally. She was due for colonoscopy for colon cancer screening as well she was set up for that.  June 20, 2019 colonoscopy 7 pedunculated and sessile polyps were found in the sigmoid colon, transverse colon, and ascending colon.  The polyps were 3 to 10 mm in size.  These polyps were removed with a cold snare. A 20 mm polyp was found in the descending colon.  The polyp was sessile.  This was biopsied with a cold snare for histology area was tattooed with injection of 2 mils of spot. There is a medium-sized lipoma in the ascending colon. Descending colon polyp is concerning for an early colon cancer. Plan for repeat colonoscopy in 1 year for surveillance.  I have discussed this case briefly with Dr. Collene Mares a few weeks ago. Seems reasonable for an opportunity to evaluate the patient and discuss potential advanced polyp resection. I do not have the pathology on here but we can certainly get that from the clinic for our records and review as well my understanding was that this was not cancerous based on tissue.  [place], please move forward with obtaining the pathology from the recent colonoscopy. Move forward with scheduling a clinic visit for discussion of EMR. Please move forward with trying to get her on the books for a procedure in July or August based on availability (90-minute colonoscopy with EMR). Please let Dr. Collene Mares and I know when this clinic visit and procedure scheduled.  Thanks.  Justice Britain, MD [facility] # [id number]

## 2019-10-03 ENCOUNTER — Other Ambulatory Visit: Payer: Self-pay | Admitting: Internal Medicine

## 2019-10-03 DIAGNOSIS — R1013 Epigastric pain: Secondary | ICD-10-CM

## 2019-10-03 DIAGNOSIS — R748 Abnormal levels of other serum enzymes: Secondary | ICD-10-CM

## 2019-10-09 ENCOUNTER — Ambulatory Visit: Payer: Medicare Other | Admitting: Cardiology

## 2019-10-09 ENCOUNTER — Encounter: Payer: Self-pay | Admitting: Cardiology

## 2019-10-09 ENCOUNTER — Other Ambulatory Visit: Payer: Self-pay

## 2019-10-09 VITALS — BP 185/95 | HR 62 | Resp 16 | Ht 66.0 in | Wt 199.8 lb

## 2019-10-09 DIAGNOSIS — R0609 Other forms of dyspnea: Secondary | ICD-10-CM

## 2019-10-09 DIAGNOSIS — E119 Type 2 diabetes mellitus without complications: Secondary | ICD-10-CM

## 2019-10-09 DIAGNOSIS — R0789 Other chest pain: Secondary | ICD-10-CM

## 2019-10-09 DIAGNOSIS — R0989 Other specified symptoms and signs involving the circulatory and respiratory systems: Secondary | ICD-10-CM

## 2019-10-09 DIAGNOSIS — I10 Essential (primary) hypertension: Secondary | ICD-10-CM

## 2019-10-09 DIAGNOSIS — E7800 Pure hypercholesterolemia, unspecified: Secondary | ICD-10-CM

## 2019-10-09 MED ORDER — AMLODIPINE BESYLATE 10 MG PO TABS: 10.0000 mg | ORAL_TABLET | Freq: Every day | ORAL | 2 refills | Status: DC

## 2019-10-09 NOTE — Progress Notes (Signed)
Primary Physician/Referring:  [name], MD  Patient ID: Lindsay [name], female    DOB: 1955/08/19, 64 y.o.   MRN: [id number]  Chief Complaint  Patient presents with  . Hypertension  . Chest Pain  . Systolic murmur   HPI:    Lindsay [name]  is a 64 y.o. African-American female referred to me for evaluation of chest discomfort, palpitations, dyspnea on exertion.  Her past medical history significant for hypertension, hyperlipidemia and diabetes mellitus and prior tobacco use disorder, has a 16-17-pack-year history quit in 2020.  Patient describes chest discomfort as tightness in the middle of the chest, also states that she has been having frequent episodes of palpitations during chest pain episodes.  Chest pain episodes are occurring during routine activities and sometimes at rest but sometimes with exertional activity.  She is also noticed gradual worsening dyspnea on exertion.  She denies any PND or orthopnea.  Past Medical History:  Diagnosis Date  . Arthritis   . Diabetes mellitus without complication ([place])   . Hypertension    Past Surgical History:  Procedure Laterality Date  . ANTERIOR AND POSTERIOR REPAIR WITH SACROSPINOUS FIXATION N/A 07/16/2016   Procedure: ANTERIOR AND POSTERIOR REPAIR WITH SACROSPINOUS FIXATION;  Surgeon: Everlene Farrier, MD;  Location: [facility];  Service: Gynecology;  Laterality: N/A;  . CYSTOSCOPY  07/16/2016   Procedure: CYSTOSCOPY;  Surgeon: Everlene Farrier, MD;  Location: [facility];  Service: Gynecology;;  . LAPAROSCOPIC VAGINAL HYSTERECTOMY WITH SALPINGECTOMY Bilateral 07/16/2016   Procedure: LAPAROSCOPIC ASSISTED VAGINAL HYSTERECTOMY WITH SALPINGECTOMY;  Surgeon: Everlene Farrier, MD;  Location: [facility];  Service: Gynecology;  Laterality: Bilateral;  . MYOMECTOMY ABDOMINAL APPROACH    . THYROID SURGERY    . tyroid     Family History  Problem Relation Age of Onset  . Aneurysm Mother   . Asthma Father     Social History   Tobacco Use  . Smoking  status: Former Smoker    Packs/day: 0.25    Years: 18.00    Pack years: 4.50    Types: Cigarettes    Quit date: 06/06/2018    Years since quitting: 1.3  . Smokeless tobacco: Never Used  Substance Use Topics  . Alcohol use: Yes    Comment: socially   Marital Status: Married  ROS  Review of Systems  Cardiovascular: Positive for chest pain, dyspnea on exertion and palpitations. Negative for leg swelling.  Gastrointestinal: Positive for abdominal pain. Negative for melena.   Objective  Blood pressure (!) 185/95, pulse 62, resp. rate 16, height 5' 6"  (1.676 m), weight 199 lb 12.8 oz (90.6 kg), SpO2 98 %.  Vitals with BMI 10/09/2019 10/09/2019 07/18/2016  Height - 5' 6"  -  Weight - 199 lbs 13 oz -  BMI - 99.37 -  Systolic 169 678 938  Diastolic 95 96 70  Pulse 62 63 68     Physical Exam HENT:     Head: Atraumatic.  Cardiovascular:     Rate and Rhythm: Normal rate and regular rhythm.     Pulses: Intact distal pulses.          Carotid pulses are on the right side with bruit and on the left side with bruit.      Radial pulses are 2+ on the right side and 2+ on the left side.       Femoral pulses are 2+ on the right side and 2+ on the left side.      Popliteal pulses are 1+  on the right side and 1+ on the left side.       Dorsalis pedis pulses are 1+ on the right side and 2+ on the left side.       Posterior tibial pulses are 0 on the right side and 1+ on the left side.     Heart sounds: S1 normal and S2 normal. Murmur heard.  Early systolic murmur is present with a grade of 3/6.  No gallop.      Comments: No edema. No JVD.  Pulmonary:     Effort: Pulmonary effort is normal.     Breath sounds: Normal breath sounds.  Abdominal:     General: Bowel sounds are normal.     Palpations: Abdomen is soft.    Laboratory examination:   No results for input(s): NA, K, CL, CO2, GLUCOSE, BUN, CREATININE, CALCIUM, GFRNONAA, GFRAA in the last 8760 hours. CrCl cannot be calculated (Patient's  most recent lab result is older than the maximum 21 days allowed.).  CMP Latest Ref Rng & Units 07/09/2016  Glucose 65 - 99 mg/dL 107(H)  BUN 6 - 20 mg/dL 18  Creatinine 0.44 - 1.00 mg/dL 0.62  Sodium 135 - 145 mmol/L 133(L)  Potassium 3.5 - 5.1 mmol/L 3.5  Chloride 101 - 111 mmol/L 100(L)  CO2 22 - 32 mmol/L 24  Calcium 8.9 - 10.3 mg/dL 9.3  Total Protein 6.5 - 8.1 g/dL 8.2(H)  Total Bilirubin 0.3 - 1.2 mg/dL 0.3  Alkaline Phos 38 - 126 U/L 71  AST 15 - 41 U/L 29  ALT 14 - 54 U/L 36   CBC Latest Ref Rng & Units 07/17/2016 07/09/2016 04/19/2008  WBC 4.0 - 10.5 K/uL 15.6(H) 6.6 7.8  Hemoglobin 12.0 - 15.0 g/dL 12.3 14.6 14.1  Hematocrit 36 - 46 % 35.6(L) 41.3 41.7  Platelets 150 - 400 K/uL 320 331 281    Lipid Panel No results for input(s): CHOL, TRIG, LDLCALC, VLDL, HDL, CHOLHDL, LDLDIRECT in the last 8760 hours.  HEMOGLOBIN A1C No results found for: HGBA1C, MPG TSH No results for input(s): TSH in the last 8760 hours.  External labs:   Lab 09/26/2019:  Serum troponin 0 0.37, minimally elevated from normal being <0.3.  Hb 12.7/HCT 38.7, platelets 423, mildly elevated.  Normal indicis.  Sedimentation rate 42, mildly elevated (<31).  Sodium 141, potassium 4.5, BUN 10, creatinine 0.52, EGFR >60 mL.  Serum glucose 141.  Alkaline phosphatase minimally elevated at 133, otherwise CMP normal.  Medications and allergies  No Known Allergies   Outpatient Medications Prior to Visit  Medication Sig Dispense Refill  . atorvastatin (LIPITOR) 20 MG tablet Take 20 mg by mouth daily.   1  . esomeprazole (NEXIUM) 40 MG capsule Take 40 mg by mouth daily.    . ferrous sulfate 325 (65 FE) MG tablet Take 1 tablet by mouth daily.    Marland Kitchen lisinopril-hydrochlorothiazide (ZESTORETIC) 20-12.5 MG tablet Take 1 tablet by mouth daily.    Marland Kitchen spironolactone (ALDACTONE) 25 MG tablet Take 25 mg by mouth daily.    Marland Kitchen acetaminophen (TYLENOL) 650 MG CR tablet Take 1,300 mg by mouth 2 (two) times daily as needed  for pain.    Marland Kitchen amLODipine-benazepril (LOTREL) 5-10 MG capsule Take 1 capsule by mouth daily.    . Cholecalciferol (VITAMIN D3) 1000 units CAPS Take 1,000 Units by mouth daily.    . hydrochlorothiazide (HYDRODIURIL) 25 MG tablet Take 25 mg by mouth daily.    Marland Kitchen ibuprofen (ADVIL,MOTRIN) 600 MG tablet Take  1 tablet (600 mg total) by mouth every 6 (six) hours as needed (mild pain). 30 tablet 0  . metFORMIN (GLUCOPHAGE) 500 MG tablet Take 500 mg by mouth daily.    Marland Kitchen oxyCODONE-acetaminophen (PERCOCET/ROXICET) 5-325 MG tablet Take 1-2 tablets by mouth every 6 (six) hours as needed (moderate to severe pain (when tolerating fluids)). 20 tablet 0   No facility-administered medications prior to visit.   Radiology:   No results found.  Cardiac Studies:    EKG  EKG 10/09/2019: Normal sinus rhythm at rate of 62 bpm, normal axis.  No evidence of ischemia.  Normal EKG.  No significant change from EKG 09/26/2019   Assessment     ICD-10-CM   1. Primary hypertension  I10 EKG 12-Lead    amLODipine (NORVASC) 10 MG tablet    PCV ECHOCARDIOGRAM COMPLETE  2. Atypical chest pain  R07.89 PCV MYOCARDIAL PERFUSION WO LEXISCAN    aspirin EC 81 MG tablet  3. Dyspnea on exertion  R06.00 PCV MYOCARDIAL PERFUSION WO LEXISCAN    PCV ECHOCARDIOGRAM COMPLETE  4. Hypercholesteremia  E78.00   5. Bilateral carotid bruits  R09.89 PCV CAROTID DUPLEX (BILATERAL)    aspirin EC 81 MG tablet  6. Type 2 diabetes mellitus without complication, without long-term current use of insulin (HCC)  E11.9 PCV MYOCARDIAL PERFUSION WO LEXISCAN     Medications Discontinued During This Encounter  Medication Reason  . acetaminophen (TYLENOL) 650 MG CR tablet Patient Preference  . amLODipine-benazepril (LOTREL) 5-10 MG capsule Change in therapy  . hydrochlorothiazide (HYDRODIURIL) 25 MG tablet Change in therapy  . ibuprofen (ADVIL,MOTRIN) 600 MG tablet No longer needed (for PRN medications)  . metFORMIN (GLUCOPHAGE) 500 MG tablet  Patient Preference  . Cholecalciferol (VITAMIN D3) 1000 units CAPS Patient Preference  . oxyCODONE-acetaminophen (PERCOCET/ROXICET) 5-325 MG tablet No longer needed (for PRN medications)     Meds ordered this encounter  Medications  . amLODipine (NORVASC) 10 MG tablet    Sig: Take 1 tablet (10 mg total) by mouth daily.    Dispense:  30 tablet    Refill:  2  . aspirin EC 81 MG tablet    Sig: Take 1 tablet (81 mg total) by mouth daily.    Dispense:  90 tablet    Refill:  3    Recommendations:   AUDRA KAGEL  is a  64 y.o. African-American female referred to me for evaluation of chest discomfort, palpitations, dyspnea on exertion.  Her past medical history significant for hypertension, hyperlipidemia and diabetes mellitus and prior tobacco use disorder, has a 16-17-pack-year history quit in 2020.  Symptoms of chest pain are atypical at most for angina pectoris but she does have significant cardiovascular risk factors as dictated above. Schedule for a Exercise Nuclear stress test to evaluate for myocardial ischemia. Will schedule for an echocardiogram. Patient instructed to start ASA  57m q daily for prophylaxis.   Schedule for carotid duplex for bruit/follow-up surveillance of carotid stenosis.  She probably has peripheral arterial disease involving small vessels as pedal pulses are abnormal.  But she does not have any symptoms suggestive of claudication.  Aggressive risk factor modification is indicated.  She has discontinued Metformin with regard to diabetes.  States that she was having significant abdominal discomfort and since discontinuing Metformin, abdominal discomfort has essentially resolved.  I will discontinue amlodipine/benazepril combination as patient is on lisinopril HCT, change to plain amlodipine 10 mg daily by increasing from 5 mg that she was on previously in the combination.  Continue atorvastatin for hyperlipidemia.  Office visit following the work-up/investigations.     Adrian Prows, MD, [facility] 10/12/2019, 3:14 PM Office: [phone]

## 2019-10-12 ENCOUNTER — Encounter: Payer: Self-pay | Admitting: Cardiology

## 2019-10-12 MED ORDER — ASPIRIN EC 81 MG PO TBEC: 81.0000 mg | DELAYED_RELEASE_TABLET | Freq: Every day | ORAL | 3 refills | Status: DC

## 2019-10-13 ENCOUNTER — Ambulatory Visit: Payer: Medicare Other | Admitting: Gastroenterology

## 2019-10-31 NOTE — Progress Notes (Signed)
Labs 10/19/2019:   Hb 12.1/HCT 36.3, platelets 192, normal indicis.  Sodium 136, potassium 4.4, serum glucose 160 mg, BUN 15, creatinine 0.75, EGFR greater than 60 mL. CMP normal.  Labs 09/15/2019:  A1c 7.1%.   Total cholesterol 218, triglycerides 115, HDL 53, LDL 134.  05/22/2019:  TSH normal at 2.46. CRP 5.20.

## 2019-11-01 ENCOUNTER — Other Ambulatory Visit: Payer: Self-pay

## 2019-11-01 ENCOUNTER — Ambulatory Visit: Payer: Medicare Other

## 2019-11-01 DIAGNOSIS — E119 Type 2 diabetes mellitus without complications: Secondary | ICD-10-CM

## 2019-11-01 DIAGNOSIS — R0789 Other chest pain: Secondary | ICD-10-CM

## 2019-11-01 DIAGNOSIS — R0609 Other forms of dyspnea: Secondary | ICD-10-CM

## 2019-11-03 ENCOUNTER — Ambulatory Visit: Payer: Medicare Other

## 2019-11-03 ENCOUNTER — Other Ambulatory Visit: Payer: Self-pay

## 2019-11-03 DIAGNOSIS — I10 Essential (primary) hypertension: Secondary | ICD-10-CM

## 2019-11-03 DIAGNOSIS — R0989 Other specified symptoms and signs involving the circulatory and respiratory systems: Secondary | ICD-10-CM

## 2019-11-03 DIAGNOSIS — R0609 Other forms of dyspnea: Secondary | ICD-10-CM

## 2019-11-07 ENCOUNTER — Ambulatory Visit
Admission: RE | Admit: 2019-11-07 | Discharge: 2019-11-07 | Disposition: A | Discharge status: Home / Self Care | Payer: Medicare Other | Source: Ambulatory Visit | Attending: Internal Medicine | Admitting: Internal Medicine

## 2019-11-07 DIAGNOSIS — R1013 Epigastric pain: Secondary | ICD-10-CM

## 2019-11-07 DIAGNOSIS — R748 Abnormal levels of other serum enzymes: Secondary | ICD-10-CM

## 2019-11-14 NOTE — Progress Notes (Signed)
Left message one VM with normal Stress test reults.

## 2019-11-17 ENCOUNTER — Ambulatory Visit: Payer: Medicare Other | Admitting: Cardiology

## 2019-11-17 NOTE — Progress Notes (Signed)
Left vm in regards to stress test results.

## 2019-11-21 ENCOUNTER — Other Ambulatory Visit: Payer: Self-pay | Admitting: Gastroenterology

## 2019-11-21 DIAGNOSIS — R9389 Abnormal findings on diagnostic imaging of other specified body structures: Secondary | ICD-10-CM

## 2019-12-07 ENCOUNTER — Ambulatory Visit: Payer: Medicare Other | Admitting: Cardiology

## 2019-12-15 ENCOUNTER — Ambulatory Visit
Admission: RE | Admit: 2019-12-15 | Discharge: 2019-12-15 | Disposition: A | Discharge status: Home / Self Care | Payer: Medicare Other | Source: Ambulatory Visit | Attending: Gastroenterology | Admitting: Gastroenterology

## 2019-12-15 DIAGNOSIS — R9389 Abnormal findings on diagnostic imaging of other specified body structures: Secondary | ICD-10-CM

## 2019-12-15 MED ORDER — GADOBENATE DIMEGLUMINE 529 MG/ML IV SOLN
15.0000 mL | Freq: Once | INTRAVENOUS | Status: AC | PRN
  Administered 2019-12-15: 15 mL via INTRAVENOUS

## 2019-12-19 ENCOUNTER — Other Ambulatory Visit: Payer: Self-pay | Admitting: Gastroenterology

## 2019-12-20 NOTE — Progress Notes (Signed)
Attempted to obtain medical history via telephone, unable to reach at this time. I left a voicemail to return pre surgical testing department's phone call.  

## 2019-12-21 ENCOUNTER — Other Ambulatory Visit (HOSPITAL_COMMUNITY)
Admission: RE | Admit: 2019-12-21 | Discharge: 2019-12-21 | Disposition: A | Discharge status: Home / Self Care | Payer: Medicare Other | Source: Ambulatory Visit | Attending: Gastroenterology | Admitting: Gastroenterology

## 2019-12-21 DIAGNOSIS — Z20822 Contact with and (suspected) exposure to covid-19: Secondary | ICD-10-CM | POA: Insufficient documentation

## 2019-12-21 DIAGNOSIS — Z01818 Encounter for other preprocedural examination: Secondary | ICD-10-CM | POA: Insufficient documentation

## 2019-12-21 LAB — SARS CORONAVIRUS 2 (TAT 6-24 HRS): SARS Coronavirus 2: NEGATIVE

## 2019-12-21 NOTE — Anesthesia Preprocedure Evaluation (Addendum)
Anesthesia Evaluation  Patient identified by MRN, date of birth, ID band Patient awake    Airway Mallampati: I       Dental  (+) Poor Dentition,    Pulmonary Patient abstained from smoking., former smoker,    Pulmonary exam normal        Cardiovascular hypertension, Pt. on medications Normal cardiovascular exam     Neuro/Psych negative neurological ROS  negative psych ROS   GI/Hepatic GERD  ,  Endo/Other  diabetes  Renal/GU   negative genitourinary   Musculoskeletal   Abdominal   Peds  Hematology   Anesthesia Other Findings   Reproductive/Obstetrics                           Anesthesia Physical Anesthesia Plan  ASA: II  Anesthesia Plan: MAC   Post-op Pain Management:    Induction:   PONV Risk Score and Plan: 2  Airway Management Planned: Natural Airway and Mask  Additional Equipment: None  Intra-op Plan:   Post-operative Plan: Extubation in OR  Informed Consent: I have reviewed the patients History and Physical, chart, labs and discussed the procedure including the risks, benefits and alternatives for the proposed anesthesia with the patient or authorized representative who has indicated his/her understanding and acceptance.     Dental advisory given  Plan Discussed with: CRNA  Anesthesia Plan Comments:        Anesthesia Quick Evaluation

## 2019-12-22 ENCOUNTER — Ambulatory Visit (HOSPITAL_COMMUNITY): Payer: Medicare Other | Admitting: Anesthesiology

## 2019-12-22 ENCOUNTER — Other Ambulatory Visit: Payer: Self-pay

## 2019-12-22 ENCOUNTER — Ambulatory Visit (HOSPITAL_COMMUNITY)
Admission: RE | Admit: 2019-12-22 | Discharge: 2019-12-22 | Disposition: A | Discharge status: Home / Self Care | Payer: Medicare Other | Attending: Gastroenterology | Admitting: Gastroenterology

## 2019-12-22 ENCOUNTER — Encounter (HOSPITAL_COMMUNITY)
Admission: RE | Disposition: A | Discharge status: Home / Self Care | Payer: Self-pay | Source: Home / Self Care | Attending: Gastroenterology

## 2019-12-22 DIAGNOSIS — Z87891 Personal history of nicotine dependence: Secondary | ICD-10-CM | POA: Diagnosis not present

## 2019-12-22 DIAGNOSIS — C801 Malignant (primary) neoplasm, unspecified: Secondary | ICD-10-CM | POA: Diagnosis not present

## 2019-12-22 DIAGNOSIS — E119 Type 2 diabetes mellitus without complications: Secondary | ICD-10-CM | POA: Diagnosis not present

## 2019-12-22 DIAGNOSIS — I10 Essential (primary) hypertension: Secondary | ICD-10-CM | POA: Diagnosis not present

## 2019-12-22 DIAGNOSIS — C787 Secondary malignant neoplasm of liver and intrahepatic bile duct: Secondary | ICD-10-CM | POA: Insufficient documentation

## 2019-12-22 DIAGNOSIS — K8689 Other specified diseases of pancreas: Secondary | ICD-10-CM | POA: Diagnosis present

## 2019-12-22 HISTORY — PX: UPPER ESOPHAGEAL ENDOSCOPIC ULTRASOUND (EUS): SHX6562

## 2019-12-22 HISTORY — PX: FINE NEEDLE ASPIRATION: SHX5430

## 2019-12-22 HISTORY — PX: ESOPHAGOGASTRODUODENOSCOPY (EGD) WITH PROPOFOL: SHX5813

## 2019-12-22 SURGERY — UPPER ESOPHAGEAL ENDOSCOPIC ULTRASOUND (EUS)
Anesthesia: Monitor Anesthesia Care

## 2019-12-22 MED ORDER — LIDOCAINE VISCOUS HCL 2 % MT SOLN
OROMUCOSAL | Status: AC
  Filled 2019-12-22: qty 15

## 2019-12-22 MED ORDER — PROPOFOL 10 MG/ML IV BOLUS
INTRAVENOUS | Status: AC
  Filled 2019-12-22: qty 20

## 2019-12-22 MED ORDER — LIDOCAINE VISCOUS HCL 2 % MT SOLN
15.0000 mL | Freq: Once | OROMUCOSAL | Status: AC
  Administered 2019-12-22: 15 mL via OROMUCOSAL

## 2019-12-22 MED ORDER — PROPOFOL 1000 MG/100ML IV EMUL
INTRAVENOUS | Status: AC
  Filled 2019-12-22: qty 100

## 2019-12-22 MED ORDER — PROPOFOL 500 MG/50ML IV EMUL
INTRAVENOUS | Status: DC | PRN
  Administered 2019-12-22: 30 mg via INTRAVENOUS
  Administered 2019-12-22: 20 mg via INTRAVENOUS
  Administered 2019-12-22 (×3): 30 mg via INTRAVENOUS

## 2019-12-22 MED ORDER — PROPOFOL 500 MG/50ML IV EMUL: INTRAVENOUS | Status: DC | PRN

## 2019-12-22 MED ORDER — SODIUM CHLORIDE 0.9 % IV SOLN: INTRAVENOUS | Status: DC

## 2019-12-22 MED ORDER — PROPOFOL 500 MG/50ML IV EMUL
INTRAVENOUS | Status: DC | PRN
  Administered 2019-12-22: 150 ug/kg/min via INTRAVENOUS

## 2019-12-22 MED ORDER — LACTATED RINGERS IV SOLN: INTRAVENOUS | Status: DC | PRN

## 2019-12-22 NOTE — Anesthesia Postprocedure Evaluation (Signed)
Anesthesia Post Note  Patient: Lindsay [name]  Procedure(s) Performed: UPPER ESOPHAGEAL ENDOSCOPIC ULTRASOUND (EUS) (N/A ) FINE NEEDLE ASPIRATION (FNA) LINEAR (N/A )     Patient location during evaluation: Endoscopy Anesthesia Type: MAC Level of consciousness: awake and sedated Pain management: pain level controlled Vital Signs Assessment: post-procedure vital signs reviewed and stable Respiratory status: spontaneous breathing Cardiovascular status: stable Postop Assessment: no apparent nausea or vomiting Anesthetic complications: no   No complications documented.  Last Vitals:  Vitals:   12/22/19 0830 12/22/19 0840  BP: 121/84 (!) 156/119  Pulse: 85 86  Resp: (!) 23 14  Temp: 36.6 C   SpO2: 100% 96%    Last Pain:  Vitals:   12/22/19 0840  TempSrc:   PainSc: 5    Pain Goal:                   Huston Foley

## 2019-12-22 NOTE — H&P (Signed)
°  Margarette Asal HPI:  The patient's MRI was positive for a pancreatic mass and significant LAD in the porta hepatis. There was also evidence of masses in the liver. She is here today to undergo further work up of the masses with an EUS with FNA.    Past Medical History:  Diagnosis Date   Arthritis    Diabetes mellitus without complication ([place])    Hypertension     Past Surgical History:  Procedure Laterality Date   ANTERIOR AND POSTERIOR REPAIR WITH SACROSPINOUS FIXATION N/A 07/16/2016   Procedure: ANTERIOR AND POSTERIOR REPAIR WITH SACROSPINOUS FIXATION;  Surgeon: Everlene Farrier, MD;  Location: [facility];  Service: Gynecology;  Laterality: N/A;   CYSTOSCOPY  07/16/2016   Procedure: CYSTOSCOPY;  Surgeon: Everlene Farrier, MD;  Location: [facility];  Service: Gynecology;;   LAPAROSCOPIC VAGINAL HYSTERECTOMY WITH SALPINGECTOMY Bilateral 07/16/2016   Procedure: LAPAROSCOPIC ASSISTED VAGINAL HYSTERECTOMY WITH SALPINGECTOMY;  Surgeon: Everlene Farrier, MD;  Location: [facility];  Service: Gynecology;  Laterality: Bilateral;   MYOMECTOMY ABDOMINAL APPROACH     THYROID SURGERY     tyroid      Family History  Problem Relation Age of Onset   Aneurysm Mother    Asthma Father     Social History:  reports that she quit smoking about 18 months ago. Her smoking use included cigarettes. She has a 4.50 pack-year smoking history. She has never used smokeless tobacco. She reports current alcohol use. She reports that she does not use drugs.  Allergies: No Known Allergies  Medications:  Scheduled:  Continuous:  sodium chloride      Results for orders placed or performed during the hospital encounter of 12/21/19 (from the past 24 hour(s))  SARS CORONAVIRUS 2 (TAT 6-24 HRS) Nasopharyngeal Nasopharyngeal Swab     Status: None   Collection Time: 12/21/19 10:10 AM   Specimen: Nasopharyngeal Swab  Result Value Ref Range   SARS Coronavirus 2 NEGATIVE NEGATIVE     No results found.  ROS:  As  stated above in the HPI otherwise negative.  Blood pressure (!) 161/93, pulse 72, temperature 98.7 F (37.1 C), temperature source Oral, resp. rate 18, height 5\' 6"  (1.676 m), weight 81.6 kg, SpO2 97 %.    PE: Gen: NAD, Alert and Oriented HEENT:  [place]/AT, EOMI Neck: Supple, no LAD Lungs: CTA Bilaterally CV: RRR without M/G/R ABD: Soft, NTND, +BS Ext: No C/C/E  Assessment/Plan: 1) Pancreatic mass - EUS with FNA.  Revere Maahs D 12/22/2019, 7:22 AM

## 2019-12-22 NOTE — Anesthesia Procedure Notes (Signed)
Date/Time: 12/22/2019 7:28 AM Performed by: Lyn Hollingshead, MD Pre-anesthesia Checklist: Patient identified, Emergency Drugs available, Suction available, Patient being monitored and Timeout performed Patient Re-evaluated:Patient Re-evaluated prior to induction Oxygen Delivery Method: Simple face mask

## 2019-12-22 NOTE — Discharge Instructions (Signed)
YOU HAD AN ENDOSCOPIC PROCEDURE TODAY: Refer to the procedure report and other information in the discharge instructions given to you for any specific questions about what was found during the examination. If this information does not answer your questions, please call [facility] at [phone] to clarify.   YOU SHOULD EXPECT: Some feelings of bloating in the abdomen. Passage of more gas than usual. Walking can help get rid of the air that was put into your GI tract during the procedure and reduce the bloating. If you had a lower endoscopy (such as a colonoscopy or flexible sigmoidoscopy) you may notice spotting of blood in your stool or on the toilet paper. Some abdominal soreness may be present for a day or two, also.  DIET: Your first meal following the procedure should be a light meal and then it is ok to progress to your normal diet. A half-sandwich or bowl of soup is an example of a good first meal. Heavy or fried foods are harder to digest and may make you feel nauseous or bloated. Drink plenty of fluids but you should avoid alcoholic beverages for 24 hours. If you had an esophageal dilation, please see attached information for diet.   ACTIVITY: Your care partner should take you home directly after the procedure. You should plan to take it easy, moving slowly for the rest of the day. You can resume normal activity the day after the procedure however YOU SHOULD NOT DRIVE, use power tools, machinery or perform tasks that involve climbing or major physical exertion for 24 hours (because of the sedation medicines used during the test).   SYMPTOMS TO REPORT IMMEDIATELY: A gastroenterologist can be reached at any hour. Please call [phone]  for any of the following symptoms:  Following lower endoscopy (colonoscopy, flexible sigmoidoscopy) Excessive amounts of blood in the stool  Significant tenderness, worsening of abdominal pains  Swelling of the abdomen that is new, acute  Fever of  100 or higher  Following upper endoscopy (EGD, EUS, ERCP, esophageal dilation) Vomiting of blood or coffee ground material  New, significant abdominal pain  New, significant chest pain or pain under the shoulder blades  Painful or persistently difficult swallowing  New shortness of breath  Black, tarry-looking or red, bloody stools  FOLLOW UP:  If any biopsies were taken you will be contacted by phone or by letter within the next 1-3 weeks. Call [phone]  if you have not heard about the biopsies in 3 weeks.  Please also call with any specific questions about appointments or follow up tests. YOU HAD AN ENDOSCOPIC PROCEDURE TODAY: Refer to the procedure report and other information in the discharge instructions given to you for any specific questions about what was found during the examination. If this information does not answer your questions, please call [facility] at [phone] to clarify.   YOU SHOULD EXPECT: Some feelings of bloating in the abdomen. Passage of more gas than usual. Walking can help get rid of the air that was put into your GI tract during the procedure and reduce the bloating. If you had a lower endoscopy (such as a colonoscopy or flexible sigmoidoscopy) you may notice spotting of blood in your stool or on the toilet paper. Some abdominal soreness may be present for a day or two, also.  DIET: Your first meal following the procedure should be a light meal and then it is ok to progress to your normal diet. A half-sandwich or bowl of soup is an example   of a good first meal. Heavy or fried foods are harder to digest and may make you feel nauseous or bloated. Drink plenty of fluids but you should avoid alcoholic beverages for 24 hours. If you had an esophageal dilation, please see attached information for diet.   ACTIVITY: Your care partner should take you home directly after the procedure. You should plan to take it easy, moving slowly for the rest of the day. You  can resume normal activity the day after the procedure however YOU SHOULD NOT DRIVE, use power tools, machinery or perform tasks that involve climbing or major physical exertion for 24 hours (because of the sedation medicines used during the test).   SYMPTOMS TO REPORT IMMEDIATELY: A gastroenterologist can be reached at any hour. Please call [phone]  for any of the following symptoms:  Following upper endoscopy (EGD, EUS, ERCP, esophageal dilation) Vomiting of blood or coffee ground material  New, significant abdominal pain  New, significant chest pain or pain under the shoulder blades  Painful or persistently difficult swallowing  New shortness of breath  Black, tarry-looking or red, bloody stools  FOLLOW UP:  If any biopsies were taken you will be contacted by phone or by letter within the next 1-3 weeks. Call [phone]  if you have not heard about the biopsies in 3 weeks.  Please also call with any specific questions about appointments or follow up tests.

## 2019-12-22 NOTE — Transfer of Care (Signed)
Immediate Anesthesia Transfer of Care Note  Patient: Lindsay [name]  Procedure(s) Performed: UPPER ESOPHAGEAL ENDOSCOPIC ULTRASOUND (EUS) (N/A ) FINE NEEDLE ASPIRATION (FNA) LINEAR (N/A )  Patient Location: Endoscopy Unit  Anesthesia Type:MAC  Level of Consciousness: awake, alert , oriented and patient cooperative  Airway & Oxygen Therapy: Patient Spontanous Breathing and Patient connected to face mask oxygen  Post-op Assessment: Report given to RN and Post -op Vital signs reviewed and stable  Post vital signs: Reviewed and stable  Last Vitals:  Vitals Value Taken Time  BP 121/84 12/22/19 0828  Temp    Pulse 81 12/22/19 0831  Resp 24 12/22/19 0831  SpO2 100 % 12/22/19 0831  Vitals shown include unvalidated device data.  Last Pain:  Vitals:   12/22/19 0655  TempSrc: Oral  PainSc: 0-No pain         Complications: No complications documented.

## 2019-12-22 NOTE — Op Note (Signed)
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 Estimated Blood Loss:     Estimated blood loss was minimal. Procedure:                Pre-Anesthesia Assessment:                           - Prior to the procedure, a History and Physical                            was performed, and patient medications and                            allergies were reviewed. The patient's tolerance of                            previous anesthesia was also reviewed. The risks                            and benefits of the procedure and the sedation                            options and risks were discussed with the patient.                            All questions were answered, and informed consent                            was obtained. Prior Anticoagulants: The patient has                            taken no previous anticoagulant or antiplatelet                            agents. ASA Grade Assessment: II - A patient with                            mild systemic disease. After reviewing the risks  and benefits, the patient was deemed in                            satisfactory condition to undergo the procedure.                           - Sedation was administered by an anesthesia                            professional. Deep sedation was attained.                           After obtaining informed consent, the endoscope was                             passed under direct vision. Throughout the                            procedure, the patient's blood pressure, pulse, and                            oxygen saturations were monitored continuously. The                            [id number] ([id number]) Olympus Linear EUS was                            introduced through the mouth, and advanced to the                            second part of duodenum. The upper EUS was                            technically difficult and complex. The patient                            tolerated the procedure well. 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 The mass was irregular, heterogenous and had well defined       margins. It measured 35 mm by 32 mm. Fine needle aspiration for cytology       was performed. Color Doppler imaging was utilized prior to needle       puncture to confirm a lack of significant vascular structures within the       needle path. Three passes were made with the 25 gauge needle using a       transduodenal approach. A stylet was used. A cytotechnologist was       present to evaluate the adequacy of the specimen. The cellularity of the       specimen was adequate. Final cytology results are pending.      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       The mass was irregular, but the borders  were well-defined. This mass did       not appear to be associated with the pancreas. The pancreatic parenchyma       displayed hypoechoic stranding and it was difficult to visualize. There       was no evidence of any PD or CBD dilation. The PD was difficult to       locate, but the CBD was identified and it measured 3 mm. A portion of       the gallbladder was visualized and there was evidence of sludge. The       masses in the liver were not able to be visualized with the EUS. Impression:               - One  lymph node was visualized and measured in the                            porta hepatis region. Fine needle aspiration                            performed. Moderate Sedation:      Not Applicable - Patient had care per Anesthesia. Recommendation:           - Patient has a contact number available for                            emergencies. The signs and symptoms of potential                            delayed complications were discussed with the                            patient. Return to normal activities tomorrow.                            Written discharge instructions were provided to the                            patient.                           - Resume regular diet.                           - Await cytology results. Procedure Code(s):        --- Professional ---                           [phone], Esophagogastroduodenoscopy, flexible,                            transoral; with transendoscopic ultrasound-guided                            intramural or transmural fine needle                            aspiration/biopsy(s), (includes endoscopic  ultrasound examination limited to the esophagus,                            stomach or duodenum, and adjacent structures) Diagnosis Code(s):        --- Professional ---                           R93.3, Abnormal findings on diagnostic imaging of                            other parts of digestive tract CPT copyright 2019 [employer]. All rights reserved. The codes documented in this report are preliminary and upon coder review may  be revised to meet current compliance requirements. [name], MD [name], MD 12/22/2019 8:40:42 AM This report has been signed electronically. Number of Addenda: 0

## 2019-12-25 ENCOUNTER — Encounter (HOSPITAL_COMMUNITY): Payer: Self-pay | Admitting: Gastroenterology

## 2019-12-25 LAB — CYTOLOGY - NON PAP

## 2019-12-27 NOTE — Progress Notes (Signed)
Patient returned my phone.  Offered her appointment for tomorrow 10/21 she states she is unable to come tomorrow.  I have scheduled her for Monday 10/25 at 3 pm with Dr. Burr Medico to arrive by 2:45.  I explained our location and that [place] parking is available free service to park her car.  She agreed to this date and time.

## 2019-12-27 NOTE — Progress Notes (Signed)
Left message for patient to call me back on my direct line to get her scheduled regarding referral we received from Dr. Benson Norway.

## 2019-12-29 NOTE — Progress Notes (Signed)
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 Benson Norway   Oncology History Overview Note  Cancer Staging No matching staging information was found for the patient.    Intrahepatic cholangiocarcinoma ([place])  06/20/2019 Procedure   Colonoscopy by Dr [name] 06/20/19  IMPRESSION -Seven 3 to 10 mm polyps in the sigmoid colon, in the transverse colon and in the escending colon, removed with a cold snare. Resected and retrireved.  -One 65mm polyp in the descending colon. Biopsies. Tattoes.  -Mediaum sized lipoma in the ascending colon.   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 See Comment B. Colon, Ascending, polyp, Polypectomy:  -TUBULAR ADENOMA -No high grade dysplasia or malignancy.  C. Colon, TRansverse, Polyo, polectomy:  -TUBULAR ADENOMA -No high grade dysplasia or malignancy.  D. Colon, Sigmoid, Polyp, Polypectomy:  -HYPERPLASTIC POLYP   11/07/2019 Imaging   US Abdomen 11/07/19  IMPRESSION: 1. Two solid masses in the liver are nonspecific. Recommend MRI abdomen with and without contrast for further evaluation.   12/15/2019 Imaging   MRI Abdomen 12/15/19  IMPRESSION: 1. There are two large masses in the liver with appearance favoring metastatic disease or hepatocellular carcinoma or cholangiocarcinoma. A benign etiology is highly unlikely given the enhancement pattern and associated adenopathy. 2. Considerable porta hepatis and retroperitoneal adenopathy. Some of the confluent porta hepatis  tumor is potentially infiltrative and abuts the pancreatic body along its right upper margin, making it difficult to completely exclude the possibility of pancreatic adenocarcinoma primary. Possibilities helpful in further workup might include tissue diagnosis, endoscopic ultrasound, or nuclear medicine PET-CT. 3. Pancreas divisum. 4. Lumbar spondylosis and degenerative disc disease. 5. Despite efforts by the technologist and patient, motion artifact is present on today's exam and could not be eliminated. This reduces exam sensitivity and specificity.   12/22/2019 Procedure   Upper Endoscopy by Dr Benson Norway 12/22/19  IMPRESSION - One lymph node was visualized and measured in the porta hepatis region. Fine needle aspiration performed    12/22/2019 Initial Biopsy   A. LIVER, PORTA HEPATIS MASS, FINE NEEDLE 12/22/19 ASPIRATION:  FINAL MICROSCOPIC DIAGNOSIS:  - Malignant cells consistent with metastatic adenocarcinoma   01/01/2020 Initial Diagnosis   Intrahepatic cholangiocarcinoma ([facility])      HISTORY OF PRESENTING ILLNESS:  Lindsay [name] 64 y.o. female is a here because of Metastatic adenocarcinoma. The patient was referred by Dr Benson Norway. The patient presents to the clinic today alone.   She notes chronic low abdominal pain for 5 years ranging up to 4 times a week. Her management is not controlled on Motrin. She notes her pain worsened in the last 3 years. She has not had scan prior to recent scans. She did go to [facility] ED once before and all they instructed her to watch what she eats. She changes her diet but that did not help much. Her colonoscopy by showed multiple polyps in 06/2019 with Dr Benson Norway with tubular adenomas. She had suspicious liver lesions on 11/07/19 [place] and 12/15/19 MRI. Her EUS with Dr [name] on 12/22/19 showed metastatic adenocarcinoma in the liver.  For her pain she has  not tried anything else to manage it. Her pain will get up to 5/10, although not controlled. She also notes  nausea which occurs with and without eating. She has not lost much weight. She denies any other issues with breathing, heart or chest pain, no headaches or other concerning general pain.   Socially she is married and live in town. Her son passed in recent years from a drowning. She has a sister and brother in town and out of town. She notes her husband did not take her cancer diagnosis well and she would like to wait to tell him more.  She has lived in [place] 43 years. She is now retired. She drinks alcohol with 3 drinks a week. She smoked lightly for 18 years before stopping in 2020.   She has a PMHx of DM. She poorly tolerated Metformin, but DM remains controlled. She has HTN, on medication. 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 I reviewed her medication with her. She denies family history of cancer.    REVIEW OF SYSTEMS:    Constitutional: Denies fevers, chills or abnormal night sweats Eyes: Denies blurriness of vision, double vision or watery eyes Ears, nose, mouth, throat, and face: Denies mucositis or sore throat Respiratory: Denies cough, dyspnea or wheezes Cardiovascular: Denies palpitation, chest discomfort or lower extremity swelling Gastrointestinal:  Denies heartburn or change in bowel habits (+) Lower abdominal pain (+) Nausea  Skin: Denies abnormal skin rashes MSK: (+) Arthritis  Lymphatics: Denies new lymphadenopathy or easy bruising Neurological:Denies numbness, tingling or new weaknesses Behavioral/Psych: Mood is stable, no new changes  All other systems were reviewed with the patient and are negative.   MEDICAL HISTORY:  Past Medical History:  Diagnosis Date  . Arthritis   . Diabetes mellitus without complication ([place])   . Hypertension     SURGICAL HISTORY: Past Surgical History:  Procedure Laterality Date  . ABDOMINAL HYSTERECTOMY    . ANTERIOR AND POSTERIOR REPAIR WITH SACROSPINOUS  FIXATION N/A 07/16/2016   Procedure: ANTERIOR AND POSTERIOR REPAIR WITH SACROSPINOUS FIXATION;  Surgeon: Everlene Farrier, MD;  Location: [facility];  Service: Gynecology;  Laterality: N/A;  . CYSTOSCOPY  07/16/2016   Procedure: CYSTOSCOPY;  Surgeon: Everlene Farrier, MD;  Location: [facility];  Service: Gynecology;;  . ESOPHAGOGASTRODUODENOSCOPY (EGD) WITH PROPOFOL N/A 12/22/2019   Procedure: ESOPHAGOGASTRODUODENOSCOPY (EGD) WITH PROPOFOL;  Surgeon: [name], MD;  Location: [facility];  Service: Endoscopy;  Laterality: N/A;  . FINE NEEDLE ASPIRATION N/A 12/22/2019   Procedure: FINE NEEDLE ASPIRATION (FNA) LINEAR;  Surgeon: [name], MD;  Location: [facility];  Service: Endoscopy;  Laterality: N/A;  . LAPAROSCOPIC VAGINAL HYSTERECTOMY WITH SALPINGECTOMY Bilateral 07/16/2016   Procedure: LAPAROSCOPIC ASSISTED VAGINAL HYSTERECTOMY WITH SALPINGECTOMY;  Surgeon: Everlene Farrier, MD;  Location: [facility];  Service: Gynecology;  Laterality: Bilateral;  . MYOMECTOMY ABDOMINAL APPROACH    . THYROID SURGERY    . tyroid    . UPPER ESOPHAGEAL ENDOSCOPIC ULTRASOUND (EUS) N/A 12/22/2019   Procedure: UPPER ESOPHAGEAL ENDOSCOPIC ULTRASOUND (EUS);  Surgeon: [name], MD;  Location: [facility];  Service: Endoscopy;  Laterality: N/A;    SOCIAL HISTORY: Social History   Socioeconomic History  . Marital status: Married    Spouse name: Not on file  . Number of children: 1  . Years of education: Not on file  . Highest education level: Not on file  Occupational History  . Occupation: retired Glass blower/designer  Tobacco Use  . Smoking status: Former Smoker    Packs/day: 0.25    Years: 18.00    Pack years: 4.50    Types: Cigarettes    Quit date: 06/06/2018    Years since quitting: 1.5  . Smokeless tobacco: Never Used  Vaping Use  . Vaping Use: Never used  Substance and Sexual Activity  . Alcohol use: Yes    Alcohol/week: 3.0 standard drinks    Types: 3 Shots of liquor per week    Comment: socially    . Drug use: No  . Sexual activity: Yes  Other Topics Concern  . Not on file  Social History Narrative  . Not on file   Social Determinants of Health   Financial Resource Strain:   . Difficulty of Paying Living Expenses: Not on file  Food Insecurity:   . Worried About Charity fundraiser in the Last Year: Not on file  . Ran Out of Food in the Last Year: Not on file  Transportation Needs:   . Lack of Transportation (Medical): Not on file  . Lack of Transportation (Non-Medical): Not on file  Physical Activity:   . Days of Exercise per Week: Not on file  . Minutes of Exercise per Session: Not on file  Stress:   . Feeling of Stress : Not on file  Social Connections:   . Frequency of Communication with Friends and Family: Not on file  . Frequency of Social Gatherings with Friends and Family: Not on file  . Attends Religious Services: Not on file  . Active Member of Clubs or Organizations: Not on file  . Attends Archivist Meetings: Not on file  . Marital Status: Not on file  Intimate Partner Violence:   . Fear of Current or Ex-Partner: Not on file  . Emotionally Abused: Not on file  . Physically Abused: Not on file  . Sexually Abused: Not on file    FAMILY HISTORY: Family History  Problem Relation Age of Onset  . Aneurysm Mother   . Asthma Father     ALLERGIES:  has No Known Allergies.  MEDICATIONS:  Current Outpatient Medications  Medication Sig Dispense Refill  . aspirin EC 81 MG tablet Take 1 tablet (81 mg total) by mouth daily. 90 tablet 3  . atorvastatin (LIPITOR) 20 MG tablet Take 20 mg by mouth at bedtime.   1  . clobetasol cream (TEMOVATE) 0.32 % Apply 1 application topically daily.    Marland Kitchen esomeprazole (NEXIUM) 40 MG capsule Take 40 mg by mouth daily.    . ferrous sulfate 325 (65 FE) MG tablet Take 1 tablet by mouth daily.    Marland Kitchen lisinopril-hydrochlorothiazide (ZESTORETIC) 20-12.5 MG tablet Take 1 tablet by mouth daily with breakfast.     . ondansetron  (ZOFRAN) 8 MG tablet Take 1 tablet (8 mg total) by mouth every 8 (eight) hours as needed for nausea or vomiting. 20 tablet 1  . spironolactone (ALDACTONE) 25 MG tablet Take 25 mg by mouth daily with breakfast.     . traMADol (ULTRAM) 50 MG tablet Take 1 tablet (50 mg total) by mouth every 6 (six) hours as needed. 15 tablet 0   No current facility-administered medications for this visit.    PHYSICAL EXAMINATION: ECOG PERFORMANCE STATUS: 1 - Symptomatic but completely ambulatory  Vitals:   01/01/20 1443  BP: (!) 169/100  Pulse: 88  Resp: 20  Temp: 97.8 F (36.6 C)  SpO2: 100%   Filed Weights  01/01/20 1443  Weight: 189 lb 3.2 oz (85.8 kg)    GENERAL:alert, no distress and comfortable SKIN: skin color, texture, turgor are normal, no rashes or significant lesions EYES: normal, Conjunctiva are pink and non-injected, sclera clear  NECK: supple, thyroid normal size, non-tender, without nodularity LYMPH:  no palpable lymphadenopathy in the cervical, axillary  LUNGS: clear to auscultation and percussion with normal breathing effort HEART: regular rate and no lower extremity edema (+) Mild heart murmur  ABDOMEN:abdomen soft, non-tender and normal bowel sounds (+) Mild epigastric tenderness  Musculoskeletal:no cyanosis of digits and no clubbing  NEURO: alert & oriented x 3 with fluent speech, no focal motor/sensory deficits  LABORATORY DATA:  I have reviewed the data as listed CBC Latest Ref Rng & Units 07/17/2016 07/09/2016 04/19/2008  WBC 4.0 - 10.5 K/uL 15.6(H) 6.6 7.8  Hemoglobin 12.0 - 15.0 g/dL 12.3 14.6 14.1  Hematocrit 36 - 46 % 35.6(L) 41.3 41.7  Platelets 150 - 400 K/uL 320 331 281    CMP Latest Ref Rng & Units 07/09/2016  Glucose 65 - 99 mg/dL 107(H)  BUN 6 - 20 mg/dL 18  Creatinine 0.44 - 1.00 mg/dL 0.62  Sodium 135 - 145 mmol/L 133(L)  Potassium 3.5 - 5.1 mmol/L 3.5  Chloride 101 - 111 mmol/L 100(L)  CO2 22 - 32 mmol/L 24  Calcium 8.9 - 10.3 mg/dL 9.3  Total Protein  6.5 - 8.1 g/dL 8.2(H)  Total Bilirubin 0.3 - 1.2 mg/dL 0.3  Alkaline Phos 38 - 126 U/L 71  AST 15 - 41 U/L 29  ALT 14 - 54 U/L 36     RADIOGRAPHIC STUDIES: I have personally reviewed the radiological images as listed and agreed with the findings in the report. MR ABDOMEN WWO CONTRAST  Result Date: 12/15/2019 CLINICAL DATA:  Abdominal pain for greater than 1 year. Recent ultrasound showed heterogeneous liver lesions. EXAM: MRI ABDOMEN WITHOUT AND WITH CONTRAST TECHNIQUE: Multiplanar multisequence MR imaging of the abdomen was performed both before and after the administration of intravenous contrast. CONTRAST:  19mL MULTIHANCE GADOBENATE DIMEGLUMINE 529 MG/ML IV SOLN COMPARISON:  Abdominal ultrasound 11/07/2019 FINDINGS: Despite efforts by the technologist and patient, motion artifact is present on today's exam and could not be eliminated. This reduces exam sensitivity and specificity. Lower chest: Unremarkable Hepatobiliary: 8.0 by 7.7 by 8.0 cm mass with lobulated margins centered in segment 4a of the liver has low T1 and mildly accentuated T2 signal characteristics and demonstrates heterogeneous but generally progressive enhancement in a non centripetal manner. Marginal pseudo capsular appearance on delayed images. There is a very similar lesion centered in the lateral segment left hepatic lobe but invading across the falciform ligament and in the medial segment, measuring 7.2 by 4.3 by 7.0 cm, with a similar heterogeneous enhancement pattern. For both of these lesions I favor metastatic disease over hepatocellular carcinoma or cholangiocarcinoma. High suspicion for malignancy. The gallbladder appears unremarkable.  No biliary dilatation. Pancreas: Pancreas divisum. There is porta hepatis adenopathy along with tumor in the porta hepatis tangential to the pancreatic body, for example on image 49 of series 11, such that a pancreatic mass is difficult to confidently exclude. The abnormal hazy  hypoenhancement along the porta hepatis measures about 4.6 by 4.8 cm on image 49 of series 11. Spleen:  Unremarkable Adrenals/Urinary Tract:  Unremarkable Stomach/Bowel: Unremarkable Vascular/Lymphatic: Indistinct porta hepatis adenopathy, including the hazy mass along the medial margin of the pancreatic body. A portacaval node measures 2.9 cm in short axis on image 56 of  series 15 and another portacaval node measures 2.3 cm in short axis on image 54 of series 15. In addition, there is a rind of retroperitoneal adenopathy surrounding the abdominal aorta which is somewhat confluent. On T2 weighted images, individual lymph nodes can be observed, including a retrocaval node measuring 1.6 cm in short axis on image 28 of series 4. Other:  No supplemental non-categorized findings. Musculoskeletal: Lumbar spondylosis and degenerative disc disease. IMPRESSION: 1. There are two large masses in the liver with appearance favoring metastatic disease or hepatocellular carcinoma or cholangiocarcinoma. A benign etiology is highly unlikely given the enhancement pattern and associated adenopathy. 2. Considerable porta hepatis and retroperitoneal adenopathy. Some of the confluent porta hepatis tumor is potentially infiltrative and abuts the pancreatic body along its right upper margin, making it difficult to completely exclude the possibility of pancreatic adenocarcinoma primary. Possibilities helpful in further workup might include tissue diagnosis, endoscopic ultrasound, or nuclear medicine PET-CT. 3. Pancreas divisum. 4. Lumbar spondylosis and degenerative disc disease. 5. Despite efforts by the technologist and patient, motion artifact is present on today's exam and could not be eliminated. This reduces exam sensitivity and specificity. Electronically Signed   By: Van Clines M.D.   On: 12/15/2019 14:37    ASSESSMENT & PLAN:  INDIANNA BORAN is a 64 y.o. African American female with   1. 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 Her 12/15/19 MRI abdomen showed 2 large liver masses indicating metastatic disease and bulky porta hepatis and retroperitoneal adenopathy. -Her EUS from 12/22/19 showed 3.5cm LN in porta hepatis region and biopsy confirmed metastatic adenocarcinoma.  Preliminary cytology sample is not adequate for any additional IHC study for the origin of tumor.  I spoke with pathologist today, the morphology does not support HCC. -Her EGD was negative for malignancy.  Her colonoscopy in March 2021 did show a large 2 cm polyps in descending colon, biopsy showed high-grade dysplasia.  Also invasive colon cancer is a small possibility, I doubt this is metastatic colon cancer. -I recommend PET scan to complete a staging and ultrasound-guided liver mass biopsy to get tumor tissue for additional IHC study was on June of cancer, and genomic testing such as [name], to see if she is a candidate for targeted therapy or immunotherapy. -Even if her primary cancer is Intrahepatic Cholangiocarcinoma, given liver metastasis and retroperitoneal lymph node involvement outside of liver, her disease may not be completely resectable, thus not curable. If colon malignancy is suspected given multiple polyps on 06/2019 Colonoscopy, more work up will be done.  -I will discuss her case in GI Tumor Board after more workup to see if resection is possible. If so I will refer her to surgeons. I discussed if her cancer is not resectable, I will likely recommend systemic treatment such as chemotherapy first  -F/u PET scan and liver biopsy -I discussed the option of Exact [employer] Study. She is interested.    2. Chronic Lower abdominal pain, Nausea  -She has had chronic low abdominal pain for 5 years ranging up to 4 times a week. She notes her pain worsened in the last 3 years, but  no work-up until 2021.   -Her pain is 5/10 at most but when high not managed on Motrin. I will call in Tramadol today (01/01/20)  for severe pain. She can otherwise she Tylenol -For her intermittent nausea I called in Zofran  today -I discussed to maintain weight she can continue Ensure with high protein/high calorie diet and remain active  -I will send dietician referral.     3. HTN, DM, Heart Murmur, Arthritis  -On medications. Managed by her PCP and Cardiologist Dr Einar Gip   4. 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  -She lives with husband and has brothers and sister in and out of town. Her only child passed in recent years.  -She notes has hesitation with telling her family about her cancer, but willing to discuss with them.    PLAN:  -Refer to Nutritional therapist for [place] dietician referral  -Lab and PET scan in 1-2 weeks  -Ultrasound-guided liver biopsy by IR in 1-2 weeks  -F/u a few days PET scan and biopsy   Orders Placed This Encounter  Procedures  . NM PET Image Initial (PI) Skull Base To Thigh    Standing Status:   Future    Standing Expiration Date:   12/31/2020    Order Specific Question:   If indicated for the ordered procedure, I authorize the administration of a radiopharmaceutical per Radiology protocol    Answer:   Yes    Order Specific Question:   Preferred imaging location?    Answer:   [facility]  . US BIOPSY (LIVER)    Standing Status:   Future    Standing Expiration Date:   12/31/2020    Order Specific Question:   Lab orders requested (DO NOT place separate lab orders, these will be automatically ordered during procedure specimen collection):    Answer:   Surgical Pathology    Order Specific Question:   Reason for Exam (SYMPTOM  OR DIAGNOSIS REQUIRED)    Answer:   need tissue for more testing of cancer origin and [name]    Order Specific Question:   Preferred location?    Answer:   [facility]  . CBC with Differential ([place] Only)    Standing Status:   Standing    Number of Occurrences:   30    Standing Expiration Date:   12/31/2020  . CMP ([place] only)    Standing Status:   Standing    Number of Occurrences:   30    Standing Expiration Date:   12/31/2020  . CEA (IN HOUSE-[facility])    Standing Status:   Standing    Number of Occurrences:   30    Standing Expiration Date:   12/31/2020  . CA 19.9    Standing Status:   Standing    Number of Occurrences:   30    Standing Expiration Date:   12/31/2020    All questions were answered. The patient knows to call the clinic with any problems, questions or concerns. The total time spent in the appointment was 60 minutes.     [name], MD 01/01/2020 10:42 PM  I, Joslyn Devon, am acting as scribe for [name], MD.   I have reviewed the above documentation for accuracy and completeness, and I agree with the above.

## 2019-12-31 ENCOUNTER — Other Ambulatory Visit: Payer: Self-pay | Admitting: Cardiology

## 2019-12-31 DIAGNOSIS — I10 Essential (primary) hypertension: Secondary | ICD-10-CM

## 2020-01-01 ENCOUNTER — Inpatient Hospital Stay: Payer: Medicare Other | Attending: Hematology | Admitting: Hematology

## 2020-01-01 ENCOUNTER — Other Ambulatory Visit: Payer: Self-pay

## 2020-01-01 ENCOUNTER — Encounter: Payer: Self-pay | Admitting: Medical Oncology

## 2020-01-01 ENCOUNTER — Encounter: Payer: Self-pay | Admitting: Hematology

## 2020-01-01 DIAGNOSIS — Z87891 Personal history of nicotine dependence: Secondary | ICD-10-CM | POA: Diagnosis not present

## 2020-01-01 DIAGNOSIS — C801 Malignant (primary) neoplasm, unspecified: Secondary | ICD-10-CM | POA: Diagnosis not present

## 2020-01-01 DIAGNOSIS — C779 Secondary and unspecified malignant neoplasm of lymph node, unspecified: Secondary | ICD-10-CM

## 2020-01-01 DIAGNOSIS — C778 Secondary and unspecified malignant neoplasm of lymph nodes of multiple regions: Secondary | ICD-10-CM | POA: Insufficient documentation

## 2020-01-01 DIAGNOSIS — M1612 Unilateral primary osteoarthritis, left hip: Secondary | ICD-10-CM | POA: Diagnosis not present

## 2020-01-01 DIAGNOSIS — R011 Cardiac murmur, unspecified: Secondary | ICD-10-CM | POA: Diagnosis not present

## 2020-01-01 DIAGNOSIS — Z86010 Personal history of colonic polyps: Secondary | ICD-10-CM | POA: Diagnosis not present

## 2020-01-01 DIAGNOSIS — C221 Intrahepatic bile duct carcinoma: Secondary | ICD-10-CM

## 2020-01-01 DIAGNOSIS — I10 Essential (primary) hypertension: Secondary | ICD-10-CM | POA: Diagnosis not present

## 2020-01-01 DIAGNOSIS — C787 Secondary malignant neoplasm of liver and intrahepatic bile duct: Secondary | ICD-10-CM | POA: Insufficient documentation

## 2020-01-01 DIAGNOSIS — R970 Elevated carcinoembryonic antigen [CEA]: Secondary | ICD-10-CM | POA: Insufficient documentation

## 2020-01-01 DIAGNOSIS — R935 Abnormal findings on diagnostic imaging of other abdominal regions, including retroperitoneum: Secondary | ICD-10-CM | POA: Diagnosis not present

## 2020-01-01 DIAGNOSIS — E119 Type 2 diabetes mellitus without complications: Secondary | ICD-10-CM | POA: Diagnosis not present

## 2020-01-01 MED ORDER — ONDANSETRON HCL 8 MG PO TABS: 8.0000 mg | ORAL_TABLET | Freq: Three times a day (TID) | ORAL | 1 refills | Status: DC | PRN

## 2020-01-01 MED ORDER — TRAMADOL HCL 50 MG PO TABS
50.0000 mg | ORAL_TABLET | Freq: Four times a day (QID) | ORAL | 0 refills | Status: DC | PRN
Start: 2020-01-01 — End: 2020-11-08

## 2020-01-01 NOTE — Progress Notes (Signed)
[employer]: Blood Sample Collection to Evaluate Biomarkers in Subjects with  Untreated Solid Tumors   Patient referred to study by Dr. Burr Medico. I met with patient this afternoon, here alone, in exam room after patient completed her scheduled visit with Dr. Burr Medico. Patient confirms that Dr. Burr Medico gave her a brief explanation of what the study is about and patient expressed interest in participating and signing consent today. I proceeded to review study consent and authortization forms, page by page, with the patient. Patient confirms understanding that participation is voluntary, the purpose of the study, the length of her participation with the study, what will be done with the samples she provides, what is expected of her, how her information will be kept private, risks and or benefits to her participation, what information will be collected as well as who will have access to this information. Once all of patient's questions were answered, patient proceeded to sign, date and time where indicated. I informed patient that we will collect the blood sample when her next lab appointment is scheduled. Confirmed with patient that she is aware that it will need to be drawn prior to the start of her chemotherapy treatment. Patient was provided with copies of her signed forms for her records. Patient was thanked for her time and interest in study.  Second nurse review for eligibility completed by Doristine Johns, RN.   Stool Collection Sub-Study of [employer] Protocol 2018-01: "Blood Sample Collection to Evaluate Biomarkers in Subjects with Untreated Solid Tumors" Sub-study was introduced to patient as well and patient expressed interest in participation. Sub-study consent and authorization forms were reviewed with patient, page by page. Patient understands that sub-study participation is voluntary and not required for participation in the blood study. After all of patient's questions answered to her satisfaction,  patient proceeded to sign and date where indicated. I reviewed with patient in detail the study collection kit contents, procedure of collection and how to ship via [place]. Teach back method used. All patient's questions were answered to her satisfaction and patient knows to contact me with any questions she may have. Patient was provided with copies of her signed documents and the stool collection kit [phone] [id number].  Patient was provided with my direct contact information and was encouraged to call with questions.  Approximately one hour was spent consenting patient and teaching patient how to collect and ship stool sample with study provided sample kit. Clabe Seal, Clinical Research Coordinator l in training, was also present during this consenting process.  Patient meets all study requirements and will be enrolled in the blood sample collection, as well as the sub-study.  Maxwell Marion, RN, BSN, [place] Clinical Research 01/01/2020 4:43 PM   01/02/2020 I received message this morning for GI navigator nurse, Valda Favia, that patient had called her this morning informing her that she was having difficulty with [employer] coming out to her address for collection of the study kit.  Outgoing call @ 10:00 AM: I called patient for more information and per patient, she called [employer] for stool kit pick-up and they gave her instructions that she didn't understand. I informed patient that I will call UPS and arrange for pick-up to occur and will call her back with more information. Patient thanked.   Outgoing call @ 10:04 AM: Call to [employer] regarding study kit pickup. I spoke to UPS rep and confirmed pick-up to occur from patient's address. I was informed that pick-up would occur today by 4PM. Confirmation # [id number]  [id number]  call @ 10:13 AM: Call to patient and informed her that pick-up has been scheduled and per UPS they will pick-up study kit by 4PM. I thanked patient for her patience and encouraged her to call me  with further questions.  Maxwell Marion, RN, BSN, [place] Clinical Research 01/02/2020 10:30 A  01/05/2020 Blood Sample Collection to Evaluate Biomarkers in Subjects with  Untreated Solid Tumors  Study blood collected this afternoon @ 1358, via a peripheral collection using Kit # [id number] and this was sent to study, same day, via [employer].  Patient was provided with 2- $50 visa study gift cards, one each for blood and stool collection, for participation in this research study .  Patient was thanked for her contribution to study and encouraged to call with questions.  Maxwell Marion, RN, BSN, [facility] Clinical Research 01/05/2020 2:54 PM

## 2020-01-01 NOTE — Progress Notes (Signed)
Met with patient who presents alone to her initial medical oncology consult with Dr. [name]. She states she has a husband and a sister and her husband who live locally and are supportive.  Her only child (a son) died a few years ago.   I explained my role as nurse navigator and she was given my card with my direct contact information.  She verbalizes an understanding of plan to obtain a PET scan and liver biopsy and knows someone with scheduling in each of those departments will contact her.

## 2020-01-02 ENCOUNTER — Telehealth: Payer: Self-pay | Admitting: Medical Oncology

## 2020-01-02 ENCOUNTER — Telehealth: Payer: Self-pay | Admitting: Hematology

## 2020-01-02 ENCOUNTER — Encounter (HOSPITAL_COMMUNITY): Payer: Self-pay

## 2020-01-02 ENCOUNTER — Other Ambulatory Visit: Payer: Self-pay | Admitting: Medical Oncology

## 2020-01-02 DIAGNOSIS — C221 Intrahepatic bile duct carcinoma: Secondary | ICD-10-CM

## 2020-01-02 NOTE — Telephone Encounter (Signed)
Stool Collection Sub-Study of [employer] Protocol 2018-01: "Blood Sample Collection to Evaluate Biomarkers in Subjects with Untreated Solid Tumors"  Incoming call @ 1300: Patient called to inform me that [employer] had picked up study kit.  Patient confirmed that sample collected at 0940 this morning. I thanked patient for confirming with me that study has been mailed off.  At this time, I informed patient, as well, that she has a scheduled lab appointment on October 29th at 2 PM. I informed her that at that time we will also collect the study blood sample and will provide her with the study provided gift cards for both sample collections. Patient was also able to answer the smoking, alcohol and family cancer history, with me over the phone. I thanked patient for her time, her call and her contribution to the study and encouraged her to call me with further questions.   Maxwell Marion, RN, BSN, [facility] Clinical Research 01/02/2020 1:14 PM

## 2020-01-02 NOTE — Telephone Encounter (Signed)
Scheduled appointments per 10/25 los. Called patient, no answer. Left message for patient with appointments dates and times.

## 2020-01-02 NOTE — Progress Notes (Unsigned)
Lindsay [name] Female, 64 y.o., [birth year] MRN:  [id number] Phone:  [phone] Lindsay [name]) PCP:  [name], MD Coverage:  Christella Scheuermann Medicare Advantage/[employer] Next Appt With Radiology ([facility] 2) 01/08/2020 at 1:00 PM  RE: Biopsy Received: Today Message Details  Arne Cleveland, MD  [name]   [place] core liver lesion  R/o mets   [facility]   Previous Messages  ----- Message -----  From: [name]  Sent: 01/02/2020 11:59 AM EDT  To: Ir Procedure Requests  Subject: Biopsy                      Procedure Requested: US Biopsy (liver)    Reason for Procedure: need tissue for more testing of cancer origin and [name]    Provider Requesting: Dr Burr Medico  Provider Telephone: [phone]    Other Info:

## 2020-01-03 ENCOUNTER — Telehealth: Payer: Self-pay | Admitting: Hematology

## 2020-01-03 NOTE — Telephone Encounter (Signed)
R/s appt on 11/8 to 11/10 per 10/27 sch message. Pt is aware of appt time and date.

## 2020-01-05 ENCOUNTER — Other Ambulatory Visit: Payer: Self-pay

## 2020-01-05 ENCOUNTER — Other Ambulatory Visit: Payer: Self-pay | Admitting: Radiology

## 2020-01-05 ENCOUNTER — Inpatient Hospital Stay: Payer: Medicare Other

## 2020-01-05 DIAGNOSIS — C787 Secondary malignant neoplasm of liver and intrahepatic bile duct: Secondary | ICD-10-CM | POA: Diagnosis not present

## 2020-01-05 DIAGNOSIS — C221 Intrahepatic bile duct carcinoma: Secondary | ICD-10-CM

## 2020-01-05 LAB — CMP (CANCER CENTER ONLY)
ALT: 26 U/L (ref 0–44)
AST: 27 U/L (ref 15–41)
Albumin: 4 g/dL (ref 3.5–5.0)
Alkaline Phosphatase: 150 U/L — ABNORMAL HIGH (ref 38–126)
Anion gap: 11 (ref 5–15)
BUN: 11 mg/dL (ref 8–23)
CO2: 28 mmol/L (ref 22–32)
Calcium: 9.8 mg/dL (ref 8.9–10.3)
Chloride: 102 mmol/L (ref 98–111)
Creatinine: 0.72 mg/dL (ref 0.44–1.00)
GFR, Estimated: >60 mL/min (ref >60)
Glucose, Bld: 109 mg/dL — ABNORMAL HIGH (ref 70–99)
Potassium: 3.7 mmol/L (ref 3.5–5.1)
Sodium: 141 mmol/L (ref 135–145)
Total Bilirubin: 0.5 mg/dL (ref 0.3–1.2)
Total Protein: 8.2 g/dL — ABNORMAL HIGH (ref 6.5–8.1)

## 2020-01-05 LAB — CBC WITH DIFFERENTIAL (CANCER CENTER ONLY)
Abs Immature Granulocytes: 0.04 10*3/uL (ref 0.00–0.07)
Basophils Absolute: 0.1 10*3/uL (ref 0.0–0.1)
Basophils Relative: 1 %
Eosinophils Absolute: 0.2 10*3/uL (ref 0.0–0.5)
Eosinophils Relative: 2 %
HCT: 39.6 % (ref 36.0–46.0)
Hemoglobin: 13.1 g/dL (ref 12.0–15.0)
Immature Granulocytes: 0 %
Lymphocytes Relative: 14 %
Lymphs Abs: 1.6 10*3/uL (ref 0.7–4.0)
MCH: 29.6 pg (ref 26.0–34.0)
MCHC: 33.1 g/dL (ref 30.0–36.0)
MCV: 89.4 fL (ref 80.0–100.0)
Monocytes Absolute: 1 10*3/uL (ref 0.1–1.0)
Monocytes Relative: 8 %
Neutro Abs: 8.6 10*3/uL — ABNORMAL HIGH (ref 1.7–7.7)
Neutrophils Relative %: 75 %
Platelet Count: 330 10*3/uL (ref 150–400)
RBC: 4.43 MIL/uL (ref 3.87–5.11)
RDW: 13.5 % (ref 11.5–15.5)
WBC Count: 11.4 10*3/uL — ABNORMAL HIGH (ref 4.0–10.5)
nRBC: 0 % (ref 0.0–0.2)

## 2020-01-05 LAB — RESEARCH LABS

## 2020-01-06 LAB — CANCER ANTIGEN 19-9: CA 19-9: 51 U/mL — ABNORMAL HIGH (ref 0–35)

## 2020-01-08 ENCOUNTER — Ambulatory Visit (HOSPITAL_COMMUNITY)
Admission: RE | Admit: 2020-01-08 | Discharge: 2020-01-08 | Disposition: A | Discharge status: Home / Self Care | Payer: Medicare Other | Source: Ambulatory Visit | Attending: Hematology | Admitting: Hematology

## 2020-01-08 ENCOUNTER — Other Ambulatory Visit: Payer: Self-pay

## 2020-01-08 ENCOUNTER — Encounter (HOSPITAL_COMMUNITY): Payer: Self-pay

## 2020-01-08 DIAGNOSIS — M5136 Other intervertebral disc degeneration, lumbar region: Secondary | ICD-10-CM | POA: Insufficient documentation

## 2020-01-08 DIAGNOSIS — Z7901 Long term (current) use of anticoagulants: Secondary | ICD-10-CM | POA: Insufficient documentation

## 2020-01-08 DIAGNOSIS — E119 Type 2 diabetes mellitus without complications: Secondary | ICD-10-CM | POA: Insufficient documentation

## 2020-01-08 DIAGNOSIS — Z7982 Long term (current) use of aspirin: Secondary | ICD-10-CM | POA: Diagnosis not present

## 2020-01-08 DIAGNOSIS — I10 Essential (primary) hypertension: Secondary | ICD-10-CM | POA: Diagnosis not present

## 2020-01-08 DIAGNOSIS — M1990 Unspecified osteoarthritis, unspecified site: Secondary | ICD-10-CM | POA: Diagnosis not present

## 2020-01-08 DIAGNOSIS — R160 Hepatomegaly, not elsewhere classified: Secondary | ICD-10-CM | POA: Insufficient documentation

## 2020-01-08 DIAGNOSIS — C787 Secondary malignant neoplasm of liver and intrahepatic bile duct: Secondary | ICD-10-CM | POA: Insufficient documentation

## 2020-01-08 DIAGNOSIS — C801 Malignant (primary) neoplasm, unspecified: Secondary | ICD-10-CM

## 2020-01-08 DIAGNOSIS — M47816 Spondylosis without myelopathy or radiculopathy, lumbar region: Secondary | ICD-10-CM | POA: Diagnosis not present

## 2020-01-08 DIAGNOSIS — Z79899 Other long term (current) drug therapy: Secondary | ICD-10-CM | POA: Insufficient documentation

## 2020-01-08 DIAGNOSIS — Q453 Other congenital malformations of pancreas and pancreatic duct: Secondary | ICD-10-CM | POA: Insufficient documentation

## 2020-01-08 DIAGNOSIS — C221 Intrahepatic bile duct carcinoma: Secondary | ICD-10-CM

## 2020-01-08 DIAGNOSIS — Z87891 Personal history of nicotine dependence: Secondary | ICD-10-CM | POA: Diagnosis not present

## 2020-01-08 HISTORY — DX: Malignant (primary) neoplasm, unspecified: C80.1

## 2020-01-08 LAB — CEA (IN HOUSE-CHCC): CEA (CHCC-In House): 183.92 ng/mL — ABNORMAL HIGH (ref 0.00–5.00)

## 2020-01-08 LAB — PROTIME-INR
INR: 1.1 (ref 0.8–1.2)
Prothrombin Time: 13.5 seconds (ref 11.4–15.2)

## 2020-01-08 LAB — GLUCOSE, CAPILLARY: Glucose-Capillary: 121 mg/dL — ABNORMAL HIGH (ref 70–99)

## 2020-01-08 MED ORDER — MIDAZOLAM HCL 2 MG/2ML IJ SOLN
INTRAMUSCULAR | Status: AC | PRN
  Administered 2020-01-08 (×2): 1 mg via INTRAVENOUS

## 2020-01-08 MED ORDER — SODIUM CHLORIDE 0.9 % IV SOLN: INTRAVENOUS | Status: DC

## 2020-01-08 MED ORDER — FENTANYL CITRATE (PF) 100 MCG/2ML IJ SOLN
INTRAMUSCULAR | Status: AC
  Filled 2020-01-08: qty 2

## 2020-01-08 MED ORDER — LIDOCAINE HCL (PF) 1 % IJ SOLN
INTRAMUSCULAR | Status: AC | PRN
  Administered 2020-01-08: 10 mL via INTRADERMAL

## 2020-01-08 MED ORDER — MIDAZOLAM HCL 2 MG/2ML IJ SOLN
INTRAMUSCULAR | Status: AC
  Filled 2020-01-08: qty 4

## 2020-01-08 MED ORDER — GELATIN ABSORBABLE 12-7 MM EX MISC
CUTANEOUS | Status: AC | PRN
  Administered 2020-01-08: 1 via TOPICAL

## 2020-01-08 MED ORDER — FENTANYL CITRATE (PF) 100 MCG/2ML IJ SOLN
INTRAMUSCULAR | Status: AC | PRN
  Administered 2020-01-08 (×2): 50 ug via INTRAVENOUS

## 2020-01-08 MED ORDER — GELATIN ABSORBABLE 12-7 MM EX MISC
CUTANEOUS | Status: AC
  Filled 2020-01-08: qty 1

## 2020-01-08 MED ORDER — LIDOCAINE HCL 1 % IJ SOLN
INTRAMUSCULAR | Status: AC
  Filled 2020-01-08: qty 20

## 2020-01-08 NOTE — Procedures (Signed)
Interventional Radiology Procedure Note  Procedure: US Guided Biopsy of left lobe liver mass  Complications: None  Estimated Blood Loss: < 10 mL  Findings: 18 G core biopsy of left lobe hepatic mass performed under US guidance.  Three core samples obtained and sent to Pathology.  Venetia Night. Kathlene Cote, M.D Pager:  [phone]

## 2020-01-08 NOTE — Consult Note (Signed)
Chief Complaint: Patient was seen in consultation today for image guided liver lesion biopsy  Referring Physician(s): Feng,Yan  Supervising Physician: Aletta Edouard  Patient Status: Lindsay [name] - Out-pt  History of Present Illness: Lindsay [name] is a 64 y.o. 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  She has had worsening abdominal pain and recent MRI of the abdomen which revealed:  1. There are two large masses in the liver with appearance favoring metastatic disease or hepatocellular carcinoma or cholangiocarcinoma. A benign etiology is highly unlikely given the enhancement pattern and associated adenopathy. 2. Considerable porta hepatis and retroperitoneal adenopathy. Some of the confluent porta hepatis tumor is potentially infiltrative and abuts the pancreatic body along its right upper margin, making it difficult to completely exclude the possibility of pancreatic adenocarcinoma primary. Possibilities helpful in further workup might include tissue diagnosis, endoscopic ultrasound, or nuclear medicine PET-CT. 3. Pancreas divisum. 4. Lumbar spondylosis and degenerative disc disease  She underwent EUS on 12/22/2019 with biopsy of porta hepatis lymph node revealing metastatic adenocarcinoma.  Prior EGD was negative for malignancy.  Colonoscopy in March of this year showed colon polyps with biopsy revealing high-grade dysplasia.  She presents today for image guided liver lesion biopsy to evaluate for testing of cancer origin and [name].   Past Medical History:  Diagnosis Date  . Arthritis   . Diabetes mellitus without complication ([place])   . Hypertension     Past Surgical History:  Procedure Laterality Date  . ABDOMINAL HYSTERECTOMY    . ANTERIOR AND POSTERIOR REPAIR WITH SACROSPINOUS FIXATION N/A 07/16/2016   Procedure: ANTERIOR AND POSTERIOR REPAIR WITH SACROSPINOUS FIXATION;   Surgeon: Everlene Farrier, MD;  Location: [facility];  Service: Gynecology;  Laterality: N/A;  . CYSTOSCOPY  07/16/2016   Procedure: CYSTOSCOPY;  Surgeon: Everlene Farrier, MD;  Location: [facility];  Service: Gynecology;;  . ESOPHAGOGASTRODUODENOSCOPY (EGD) WITH PROPOFOL N/A 12/22/2019   Procedure: ESOPHAGOGASTRODUODENOSCOPY (EGD) WITH PROPOFOL;  Surgeon: [name], MD;  Location: [facility];  Service: Endoscopy;  Laterality: N/A;  . FINE NEEDLE ASPIRATION N/A 12/22/2019   Procedure: FINE NEEDLE ASPIRATION (FNA) LINEAR;  Surgeon: [name], MD;  Location: [facility];  Service: Endoscopy;  Laterality: N/A;  . LAPAROSCOPIC VAGINAL HYSTERECTOMY WITH SALPINGECTOMY Bilateral 07/16/2016   Procedure: LAPAROSCOPIC ASSISTED VAGINAL HYSTERECTOMY WITH SALPINGECTOMY;  Surgeon: Everlene Farrier, MD;  Location: [facility];  Service: Gynecology;  Laterality: Bilateral;  . MYOMECTOMY ABDOMINAL APPROACH    . THYROID SURGERY    . tyroid    . UPPER ESOPHAGEAL ENDOSCOPIC ULTRASOUND (EUS) N/A 12/22/2019   Procedure: UPPER ESOPHAGEAL ENDOSCOPIC ULTRASOUND (EUS);  Surgeon: [name], MD;  Location: [facility];  Service: Endoscopy;  Laterality: N/A;    Allergies: Patient has no known allergies.  Medications: Prior to Admission medications   Medication Sig Start Date End Date Taking? Authorizing Provider  aspirin EC 81 MG tablet Take 1 tablet (81 mg total) by mouth daily. 10/12/19   Adrian Prows, MD  atorvastatin (LIPITOR) 20 MG tablet Take 20 mg by mouth at bedtime.  07/03/16   [provider]  clobetasol cream (TEMOVATE) 6.96 % Apply 1 application topically daily.    [provider]  esomeprazole (NEXIUM) 40 MG capsule Take 40 mg by mouth daily. 09/26/19   [provider]  ferrous sulfate 325 (65 FE) MG tablet Take 1 tablet by mouth daily.    [provider]  lisinopril-hydrochlorothiazide (ZESTORETIC) 20-12.5 MG tablet Take 1 tablet by  mouth daily with breakfast.     [provider]  ondansetron (ZOFRAN) 8 MG tablet Take 1 tablet (8 mg total) by mouth every 8 (eight) hours as needed for nausea or vomiting. 01/01/20   [name], MD  spironolactone (ALDACTONE) 25 MG tablet Take 25 mg by mouth daily with breakfast.  09/19/19   [provider]  traMADol (ULTRAM) 50 MG tablet Take 1 tablet (50 mg total) by mouth every 6 (six) hours as needed. 01/01/20   [name], MD     Family History  Problem Relation Age of Onset  . Aneurysm Mother   . Asthma Father     Social History   Socioeconomic History  . Marital status: Married    Spouse name: Not on file  . Number of children: 1  . Years of education: Not on file  . Highest education level: Not on file  Occupational History  . Occupation: retired Glass blower/designer   Tobacco Use  . Smoking status: Former Smoker    Packs/day: 0.25    Years: 18.00    Pack years: 4.50    Types: Cigarettes    Quit date: 06/06/2018    Years since quitting: 1.5  . Smokeless tobacco: Never Used  Vaping Use  . Vaping Use: Never used  Substance and Sexual Activity  . Alcohol use: Yes    Alcohol/week: 3.0 standard drinks    Types: 3 Shots of liquor per week    Comment: socially  . Drug use: No  . Sexual activity: Yes  Other Topics Concern  . Not on file  Social History Narrative  . Not on file   Social Determinants of Health   Financial Resource Strain:   . Difficulty of Paying Living Expenses: Not on file  Food Insecurity:   . Worried About Charity fundraiser in the Last Year: Not on file  . Ran Out of Food in the Last Year: Not on file  Transportation Needs:   . Lack of Transportation (Medical): Not on file  . Lack of Transportation (Non-Medical): Not on file  Physical Activity:   . Days of Exercise per Week: Not on file  . Minutes of Exercise per Session: Not on file  Stress:   . Feeling of Stress : Not on file  Social Connections:   . Frequency of Communication with Friends and Family: Not on file   . Frequency of Social Gatherings with Friends and Family: Not on file  . Attends Religious Services: Not on file  . Active Member of Clubs or Organizations: Not on file  . Attends Archivist Meetings: Not on file  . Marital Status: Not on file      Review of Systems:  currently denies fever, headache, chest pain, dyspnea, cough, back pain, nausea, vomiting or bleeding.  She does have upper abdominal discomfort  Vital Signs: BP (!) 179/88   Pulse 65   Temp 98 F (36.7 C) (Oral)   Resp 16   SpO2 99%   Physical Exam awake, alert.  Chest clear to auscultation bilaterally.  Heart with regular rate and rhythm, soft murmur; abdomen soft, positive bowel sounds, tenderness noted in upper abdominal regions; no lower extremity edema.  Imaging: MR ABDOMEN WWO CONTRAST  Result Date: 12/15/2019 CLINICAL DATA:  Abdominal pain for greater than 1 year. Recent ultrasound showed heterogeneous liver lesions. EXAM: MRI ABDOMEN WITHOUT AND WITH CONTRAST TECHNIQUE: Multiplanar multisequence MR imaging of the abdomen was performed both before and after  the administration of intravenous contrast. CONTRAST:  60mL MULTIHANCE GADOBENATE DIMEGLUMINE 529 MG/ML IV SOLN COMPARISON:  Abdominal ultrasound 11/07/2019 FINDINGS: Despite efforts by the technologist and patient, motion artifact is present on today's exam and could not be eliminated. This reduces exam sensitivity and specificity. Lower chest: Unremarkable Hepatobiliary: 8.0 by 7.7 by 8.0 cm mass with lobulated margins centered in segment 4a of the liver has low T1 and mildly accentuated T2 signal characteristics and demonstrates heterogeneous but generally progressive enhancement in a non centripetal manner. Marginal pseudo capsular appearance on delayed images. There is a very similar lesion centered in the lateral segment left hepatic lobe but invading across the falciform ligament and in the medial segment, measuring 7.2 by 4.3 by 7.0 cm, with a  similar heterogeneous enhancement pattern. For both of these lesions I favor metastatic disease over hepatocellular carcinoma or cholangiocarcinoma. High suspicion for malignancy. The gallbladder appears unremarkable.  No biliary dilatation. Pancreas: Pancreas divisum. There is porta hepatis adenopathy along with tumor in the porta hepatis tangential to the pancreatic body, for example on image 49 of series 11, such that a pancreatic mass is difficult to confidently exclude. The abnormal hazy hypoenhancement along the porta hepatis measures about 4.6 by 4.8 cm on image 49 of series 11. Spleen:  Unremarkable Adrenals/Urinary Tract:  Unremarkable Stomach/Bowel: Unremarkable Vascular/Lymphatic: Indistinct porta hepatis adenopathy, including the hazy mass along the medial margin of the pancreatic body. A portacaval node measures 2.9 cm in short axis on image 56 of series 15 and another portacaval node measures 2.3 cm in short axis on image 54 of series 15. In addition, there is a rind of retroperitoneal adenopathy surrounding the abdominal aorta which is somewhat confluent. On T2 weighted images, individual lymph nodes can be observed, including a retrocaval node measuring 1.6 cm in short axis on image 28 of series 4. Other:  No supplemental non-categorized findings. Musculoskeletal: Lumbar spondylosis and degenerative disc disease. IMPRESSION: 1. There are two large masses in the liver with appearance favoring metastatic disease or hepatocellular carcinoma or cholangiocarcinoma. A benign etiology is highly unlikely given the enhancement pattern and associated adenopathy. 2. Considerable porta hepatis and retroperitoneal adenopathy. Some of the confluent porta hepatis tumor is potentially infiltrative and abuts the pancreatic body along its right upper margin, making it difficult to completely exclude the possibility of pancreatic adenocarcinoma primary. Possibilities helpful in further workup might include tissue  diagnosis, endoscopic ultrasound, or nuclear medicine PET-CT. 3. Pancreas divisum. 4. Lumbar spondylosis and degenerative disc disease. 5. Despite efforts by the technologist and patient, motion artifact is present on today's exam and could not be eliminated. This reduces exam sensitivity and specificity. Electronically Signed   By: Van Clines M.D.   On: 12/15/2019 14:37    Labs:  CBC: Recent Labs    01/05/20 1350  WBC 11.4*  HGB 13.1  HCT 39.6  PLT 330    COAGS: No results for input(s): INR, APTT in the last 8760 hours.  BMP: Recent Labs    01/05/20 1350  NA 141  K 3.7  CL 102  CO2 28  GLUCOSE 109*  BUN 11  CALCIUM 9.8  CREATININE 0.72  GFRNONAA >60    LIVER FUNCTION TESTS: Recent Labs    01/05/20 1350  BILITOT 0.5  AST 27  ALT 26  ALKPHOS 150*  PROT 8.2*  ALBUMIN 4.0    TUMOR MARKERS: No results for input(s): AFPTM, CEA, CA199, CHROMGRNA in the last 8760 hours.  Assessment and Plan: 64 y.o.  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  She has had worsening abdominal pain and recent MRI of the abdomen which revealed:  1. There are two large masses in the liver with appearance favoring metastatic disease or hepatocellular carcinoma or cholangiocarcinoma. A benign etiology is highly unlikely given the enhancement pattern and associated adenopathy. 2. Considerable porta hepatis and retroperitoneal adenopathy. Some of the confluent porta hepatis tumor is potentially infiltrative and abuts the pancreatic body along its right upper margin, making it difficult to completely exclude the possibility of pancreatic adenocarcinoma primary. Possibilities helpful in further workup might include tissue diagnosis, endoscopic ultrasound, or nuclear medicine PET-CT. 3. Pancreas divisum. 4. Lumbar spondylosis and degenerative disc disease  She underwent EUS on 12/22/2019 with  biopsy of porta hepatis lymph node revealing metastatic adenocarcinoma.  Prior EGD was negative for malignancy.  Colonoscopy in March of this year showed colon polyps with biopsy revealing high-grade dysplasia.  She presents today for image guided liver lesion biopsy to evaluate for testing of cancer origin and [name].Risks and benefits of procedure was discussed with the patient  including, but not limited to bleeding, infection, damage to adjacent structures or low yield requiring additional tests.  All of the questions were answered and there is agreement to proceed.  Consent signed and in chart.  CA 19-9 is 51 and CEA is 183.92   Thank you for this interesting consult.  I greatly enjoyed meeting LILIANE MALLIS and look forward to participating in their care.  A copy of this report was sent to the requesting provider on this date.  Electronically Signed: D. Rowe Robert, PA-C 01/08/2020, 11:26 AM   I spent a total of  25 minutes   in face to face in clinical consultation, greater than 50% of which was counseling/coordinating care for image guided liver lesion biopsy

## 2020-01-08 NOTE — Discharge Instructions (Signed)
Urgent needs - IR on call MD [phone]  Wound - May remove dressing and shower in 24 hours.  Keep site clean and dry.  Replace with bandaid. Do not submerge in tub or water until site healing well.    Liver Biopsy, Care After These instructions give you information about how to care for yourself after your procedure. Your health care provider may also give you more specific instructions. If you have problems or questions, contact your health care provider. What can I expect after the procedure? After your procedure, it is common to have:  Pain and soreness in the area where the biopsy was done.  Bruising around the area where the biopsy was done.  Sleepiness and fatigue for 1-2 days. Follow these instructions at home: Medicines  Take over-the-counter and prescription medicines only as told by your health care provider.  If you were prescribed an antibiotic medicine, take it as told by your health care provider. Do not stop taking the antibiotic even if you start to feel better.  Do not take medicines such as aspirin and ibuprofen unless your health care provider tells you to take them. These medicines thin your blood and can increase the risk of bleeding.  If you are taking prescription pain medicine, take actions to prevent or treat constipation. Your health care provider may recommend that you: ? Drink enough fluid to keep your urine pale yellow. ? Eat foods that are high in fiber, such as fresh fruits and vegetables, whole grains, and beans. ? Limit foods that are high in fat and processed sugars, such as fried or sweet foods. ? Take an over-the-counter or prescription medicine for constipation. Incision care  Follow instructions from your health care provider about how to take care of your incision. Make sure you: ? Wash your hands with soap and water before you change your bandage (dressing). If soap and water are not available, use hand sanitizer. ? Change your dressing as  told by your health care provider. ? Leave stitches (sutures), skin glue, or adhesive strips in place. These skin closures may need to stay in place for 2 weeks or longer. If adhesive strip edges start to loosen and curl up, you may trim the loose edges. Do not remove adhesive strips completely unless your health care provider tells you to do that.  Check your incision area every day for signs of infection. Check for: ? Redness, swelling, or pain. ? Fluid or blood. ? Warmth. ? Pus or a bad smell.  Do not take baths, swim, or use a hot tub until your health care provider says it is okay to do so. Activity  Rest at home for 1-2 days, or as directed by your health care provider. ? Avoid sitting for a long time without moving. Get up to take short walks every 1-2 hours. This is important to improve blood flow and breathing. Ask for help if you feel weak or unsteady.  Return to your normal activities as told by your health care provider. Ask your health care provider what activities are safe for you.  Do not drive or use heavy machinery while taking prescription pain medicine.  Do not lift anything that is heavier than 10 lb (4.5 kg), or the limit that your health care provider tells you, until he or she says that it is safe.  Do not play contact sports for 2 weeks after the procedure. General instructions  Do not drink alcohol in the first week after the procedure.  Have someone stay with you for at least 24 hours after the procedure.  It is your responsibility to obtain your test results. Ask your health care provider, or the department that is doing the test: ? When will my results be ready? ? How will I get my results? ? What are my treatment options? ? What other tests do I need? ? What are my next steps?  Keep all follow-up visits as told by your health care provider. This is important. Contact a health care provider if:  You have increased bleeding from an incision, resulting  in more than a small spot of blood.  You have redness, swelling, or increasing pain in any incisions.  You notice a discharge or a bad smell coming from any of your incisions.  You have a fever or chills. Get help right away if:  You develop swelling, bloating, or pain in your abdomen.  You become dizzy or faint.  You develop a rash.  You have nausea or you vomit.  You faint, or you have shortness of breath or difficulty breathing.  You develop chest pain.  You have problems with your speech or vision.  You have trouble with your balance or moving your arms or legs. Summary  After the liver biopsy, it is common to have pain, soreness, and bruising in the area, as well as sleepiness and fatigue.  Take over-the-counter and prescription medicines only as told by your health care provider.  Follow instructions from your health care provider about how to care for your incision. Check the incision area daily for signs of infection. This information is not intended to replace advice given to you by your health care provider. Make sure you discuss any questions you have with your health care provider. Document Revised: 04/18/2018 Document Reviewed: 03/05/2017 Elsevier Patient Education  [place].   Moderate Conscious Sedation, Adult, Care After These instructions provide you with information about caring for yourself after your procedure. Your health care provider may also give you more specific instructions. Your treatment has been planned according to current medical practices, but problems sometimes occur. Call your health care provider if you have any problems or questions after your procedure. What can I expect after the procedure? After your procedure, it is common:  To feel sleepy for several hours.  To feel clumsy and have poor balance for several hours.  To have poor judgment for several hours.  To vomit if you eat too soon. Follow these instructions at  home: For at least 24 hours after the procedure:  Do not: ? Participate in activities where you could fall or become injured. ? Drive. ? Use heavy machinery. ? Drink alcohol. ? Take sleeping pills or medicines that cause drowsiness. ? Make important decisions or sign legal documents. ? Take care of children on your own.  Rest. Eating and drinking  Follow the diet recommended by your health care provider.  If you vomit: ? Drink water, juice, or soup when you can drink without vomiting. ? Make sure you have little or no nausea before eating solid foods. General instructions  Have a responsible adult stay with you until you are awake and alert.  Take over-the-counter and prescription medicines only as told by your health care provider.  If you smoke, do not smoke without supervision.  Keep all follow-up visits as told by your health care provider. This is important. Contact a health care provider if:  You keep feeling nauseous or you keep vomiting.  You feel light-headed.  You develop a rash.  You have a fever. Get help right away if:  You have trouble breathing. This information is not intended to replace advice given to you by your health care provider. Make sure you discuss any questions you have with your health care provider. Document Revised: 02/05/2017 Document Reviewed: 06/15/2015 Elsevier Patient Education  2020 [employer].

## 2020-01-10 NOTE — Progress Notes (Signed)
Spoke to [employer] and moved patient's PET from 11/9 to Friday 11/5 to arrive at [facility] at 8:00 and moved her follow up with Dr. Burr Medico to Monday 11/8 at 9:40 to arrive at 9:20.  I have called the patient with these appointments and reviewed instructions for PET scan, NPO 6 hours prior except sips of water, no carbs 12 hours prior.  She verbalized an understanding of all instructions given.

## 2020-01-12 ENCOUNTER — Inpatient Hospital Stay: Payer: Medicare Other | Attending: Hematology | Admitting: Hematology

## 2020-01-12 ENCOUNTER — Ambulatory Visit (HOSPITAL_COMMUNITY)
Admission: RE | Admit: 2020-01-12 | Discharge: 2020-01-12 | Disposition: A | Discharge status: Home / Self Care | Payer: Medicare Other | Source: Ambulatory Visit | Attending: Hematology | Admitting: Hematology

## 2020-01-12 ENCOUNTER — Other Ambulatory Visit: Payer: Self-pay

## 2020-01-12 ENCOUNTER — Telehealth: Payer: Self-pay | Admitting: Hematology

## 2020-01-12 VITALS — BP 158/86 | HR 71 | Temp 98.1°F | Resp 18 | Ht 66.0 in | Wt 184.9 lb

## 2020-01-12 DIAGNOSIS — I10 Essential (primary) hypertension: Secondary | ICD-10-CM | POA: Insufficient documentation

## 2020-01-12 DIAGNOSIS — R911 Solitary pulmonary nodule: Secondary | ICD-10-CM | POA: Diagnosis not present

## 2020-01-12 DIAGNOSIS — Z87891 Personal history of nicotine dependence: Secondary | ICD-10-CM | POA: Diagnosis not present

## 2020-01-12 DIAGNOSIS — Z7984 Long term (current) use of oral hypoglycemic drugs: Secondary | ICD-10-CM | POA: Insufficient documentation

## 2020-01-12 DIAGNOSIS — I700 Atherosclerosis of aorta: Secondary | ICD-10-CM | POA: Diagnosis not present

## 2020-01-12 DIAGNOSIS — I2510 Atherosclerotic heart disease of native coronary artery without angina pectoris: Secondary | ICD-10-CM | POA: Insufficient documentation

## 2020-01-12 DIAGNOSIS — Z86010 Personal history of colonic polyps: Secondary | ICD-10-CM | POA: Insufficient documentation

## 2020-01-12 DIAGNOSIS — C787 Secondary malignant neoplasm of liver and intrahepatic bile duct: Secondary | ICD-10-CM | POA: Diagnosis present

## 2020-01-12 DIAGNOSIS — Z298 Encounter for other specified prophylactic measures: Secondary | ICD-10-CM | POA: Diagnosis not present

## 2020-01-12 DIAGNOSIS — R011 Cardiac murmur, unspecified: Secondary | ICD-10-CM | POA: Insufficient documentation

## 2020-01-12 DIAGNOSIS — E119 Type 2 diabetes mellitus without complications: Secondary | ICD-10-CM | POA: Insufficient documentation

## 2020-01-12 DIAGNOSIS — C778 Secondary and unspecified malignant neoplasm of lymph nodes of multiple regions: Secondary | ICD-10-CM | POA: Diagnosis not present

## 2020-01-12 DIAGNOSIS — Z5111 Encounter for antineoplastic chemotherapy: Secondary | ICD-10-CM | POA: Insufficient documentation

## 2020-01-12 DIAGNOSIS — C221 Intrahepatic bile duct carcinoma: Secondary | ICD-10-CM | POA: Insufficient documentation

## 2020-01-12 DIAGNOSIS — C779 Secondary and unspecified malignant neoplasm of lymph node, unspecified: Secondary | ICD-10-CM | POA: Insufficient documentation

## 2020-01-12 DIAGNOSIS — C801 Malignant (primary) neoplasm, unspecified: Secondary | ICD-10-CM | POA: Diagnosis not present

## 2020-01-12 LAB — GLUCOSE, CAPILLARY: Glucose-Capillary: 115 mg/dL — ABNORMAL HIGH (ref 70–99)

## 2020-01-12 MED ORDER — FLUDEOXYGLUCOSE F - 18 (FDG) INJECTION
9.4000 | Freq: Once | INTRAVENOUS | Status: AC | PRN
  Administered 2020-01-12: 9.4 via INTRAVENOUS

## 2020-01-12 NOTE — Telephone Encounter (Signed)
Scheduled per 11/5 los. Printed avs and calendar for pt.

## 2020-01-12 NOTE — Progress Notes (Signed)
Lindsay [name]   Telephone:([phone] Fax:([phone]) [phone]   Clinic Follow up Note   Patient Care Team: [name], MD as PCP - General (Internal Medicine) [name], RN as Oncology Nurse Navigator [name], MD as Consulting Physician (Oncology) [name], MD as Consulting Physician (Gastroenterology)  Date of Service:  01/12/2020  CHIEF COMPLAINT: discuss scan and biopsy results   SUMMARY OF ONCOLOGIC HISTORY: Oncology History Overview Note  Cancer Staging No matching staging information was found for the patient.    metastatic colon cancer to liver  06/20/2019 Procedure   Colonoscopy by Dr [name] 06/20/19  IMPRESSION -Seven 3 to 10 mm polyps in the sigmoid colon, in the transverse colon and in the escending colon, removed with a cold snare. Resected and retrireved.  -One 19mm polyp in the descending colon. Biopsies. Tattoes.  -Mediaum sized lipoma in the ascending colon.   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 See Comment B. Colon, Ascending, polyp, Polypectomy:  -TUBULAR ADENOMA -No high grade dysplasia or malignancy.  C. Colon, TRansverse, Polyo, polectomy:  -TUBULAR ADENOMA -No high grade dysplasia or malignancy.  D. Colon, Sigmoid, Polyp, Polypectomy:  -HYPERPLASTIC POLYP   11/07/2019 Imaging   US Abdomen 11/07/19  IMPRESSION: 1. Two solid masses in the liver are nonspecific. Recommend MRI abdomen with and without contrast for further evaluation.   12/15/2019 Imaging   MRI Abdomen 12/15/19  IMPRESSION: 1. There are two large masses in the liver with appearance favoring metastatic disease or hepatocellular carcinoma or cholangiocarcinoma. A benign etiology is highly unlikely given the enhancement pattern and associated adenopathy. 2. Considerable porta hepatis and retroperitoneal adenopathy. Some of the confluent porta hepatis tumor is potentially  infiltrative and abuts the pancreatic body along its right upper margin, making it difficult to completely exclude the possibility of pancreatic adenocarcinoma primary. Possibilities helpful in further workup might include tissue diagnosis, endoscopic ultrasound, or nuclear medicine PET-CT. 3. Pancreas divisum. 4. Lumbar spondylosis and degenerative disc disease. 5. Despite efforts by the technologist and patient, motion artifact is present on today's exam and could not be eliminated. This reduces exam sensitivity and specificity.   12/22/2019 Procedure   Upper Endoscopy by Dr Benson Norway 12/22/19  IMPRESSION - One lymph node was visualized and measured in the porta hepatis region. Fine needle aspiration performed    12/22/2019 Initial Biopsy   A. LIVER, PORTA HEPATIS MASS, FINE NEEDLE 12/22/19 ASPIRATION:  FINAL MICROSCOPIC DIAGNOSIS:  - Malignant cells consistent with metastatic adenocarcinoma   01/01/2020 Initial Diagnosis   Intrahepatic cholangiocarcinoma (HCC)      CURRENT THERAPY:  Pending chemo   INTERVAL HISTORY:  Lindsay [name] is here for a follow up. She presents to the clinic alone. She has moderate fatigue, but able to function well at home.  She has occasional abdominal discomfort, no significant pain, nausea or other symptoms.  All other systems were reviewed with the patient and are negative.  MEDICAL HISTORY:  Past Medical History:  Diagnosis Date  . Arthritis   . Diabetes mellitus without complication ([place])   . Hypertension     SURGICAL HISTORY: Past Surgical History:  Procedure Laterality Date  . ABDOMINAL HYSTERECTOMY    . ANTERIOR AND POSTERIOR REPAIR WITH SACROSPINOUS FIXATION N/A 07/16/2016   Procedure: ANTERIOR AND POSTERIOR REPAIR WITH SACROSPINOUS FIXATION;  Surgeon: Everlene Farrier, MD;  Location: [facility];  Service: Gynecology;  Laterality: N/A;  . CYSTOSCOPY  07/16/2016   Procedure:  CYSTOSCOPY;  Surgeon: Everlene Farrier, MD;  Location: [facility];   Service: Gynecology;;  . ESOPHAGOGASTRODUODENOSCOPY (EGD) WITH PROPOFOL N/A 12/22/2019   Procedure: ESOPHAGOGASTRODUODENOSCOPY (EGD) WITH PROPOFOL;  Surgeon: [name], MD;  Location: [facility];  Service: Endoscopy;  Laterality: N/A;  . FINE NEEDLE ASPIRATION N/A 12/22/2019   Procedure: FINE NEEDLE ASPIRATION (FNA) LINEAR;  Surgeon: [name], MD;  Location: [facility];  Service: Endoscopy;  Laterality: N/A;  . LAPAROSCOPIC VAGINAL HYSTERECTOMY WITH SALPINGECTOMY Bilateral 07/16/2016   Procedure: LAPAROSCOPIC ASSISTED VAGINAL HYSTERECTOMY WITH SALPINGECTOMY;  Surgeon: Everlene Farrier, MD;  Location: [facility];  Service: Gynecology;  Laterality: Bilateral;  . MYOMECTOMY ABDOMINAL APPROACH    . THYROID SURGERY    . tyroid    . UPPER ESOPHAGEAL ENDOSCOPIC ULTRASOUND (EUS) N/A 12/22/2019   Procedure: UPPER ESOPHAGEAL ENDOSCOPIC ULTRASOUND (EUS);  Surgeon: [name], MD;  Location: [facility];  Service: Endoscopy;  Laterality: N/A;    I have reviewed the social history and family history with the patient and they are unchanged from previous note.  ALLERGIES:  has No Known Allergies.  MEDICATIONS:  Current Outpatient Medications  Medication Sig Dispense Refill  . aspirin EC 81 MG tablet Take 1 tablet (81 mg total) by mouth daily. 90 tablet 3  . atorvastatin (LIPITOR) 20 MG tablet Take 20 mg by mouth at bedtime.   1  . clobetasol cream (TEMOVATE) 8.41 % Apply 1 application topically daily.    Marland Kitchen esomeprazole (NEXIUM) 40 MG capsule Take 40 mg by mouth daily.    . ferrous sulfate 325 (65 FE) MG tablet Take 1 tablet by mouth daily.    Marland Kitchen lisinopril-hydrochlorothiazide (ZESTORETIC) 20-12.5 MG tablet Take 1 tablet by mouth daily with breakfast.     . ondansetron (ZOFRAN) 8 MG tablet Take 1 tablet (8 mg total) by mouth every 8 (eight) hours as needed for nausea or vomiting. 20 tablet 1  . spironolactone (ALDACTONE) 25 MG tablet Take 25 mg by mouth daily with breakfast.     . traMADol  (ULTRAM) 50 MG tablet Take 1 tablet (50 mg total) by mouth every 6 (six) hours as needed. 15 tablet 0   No current facility-administered medications for this visit.    PHYSICAL EXAMINATION: ECOG PERFORMANCE STATUS: 1 - Symptomatic but completely ambulatory  Vitals:   01/12/20 1231  BP: (!) 158/86  Pulse: 71  Resp: 18  Temp: 98.1 F (36.7 C)  SpO2: 98%   Filed Weights   01/12/20 1231  Weight: 184 lb 14.4 oz (83.9 kg)    GENERAL:alert, no distress and comfortable SKIN: skin color, texture, turgor are normal, no rashes or significant lesions EYES: normal, Conjunctiva are pink and non-injected, sclera clear NECK: supple, thyroid normal size, non-tender, without nodularity LYMPH:  no palpable lymphadenopathy in the cervical, axillary  LUNGS: clear to auscultation and percussion with normal breathing effort HEART: regular rate & rhythm and no murmurs and no lower extremity edema ABDOMEN:abdomen soft, non-tender and normal bowel sounds Musculoskeletal:no cyanosis of digits and no clubbing  NEURO: alert & oriented x 3 with fluent speech, no focal motor/sensory deficits  LABORATORY DATA:  I have reviewed the data as listed CBC Latest Ref Rng & Units 01/05/2020 07/17/2016 07/09/2016  WBC 4.0 - 10.5 K/uL 11.4(H) 15.6(H) 6.6  Hemoglobin 12.0 - 15.0 g/dL 13.1 12.3 14.6  Hematocrit 36 - 46 % 39.6 35.6(L) 41.3  Platelets 150 - 400 K/uL 330 320 331     CMP Latest Ref Rng & Units 01/05/2020 07/09/2016  Glucose  70 - 99 mg/dL 109(H) 107(H)  BUN 8 - 23 mg/dL 11 18  Creatinine 0.44 - 1.00 mg/dL 0.72 0.62  Sodium 135 - 145 mmol/L 141 133(L)  Potassium 3.5 - 5.1 mmol/L 3.7 3.5  Chloride 98 - 111 mmol/L 102 100(L)  CO2 22 - 32 mmol/L 28 24  Calcium 8.9 - 10.3 mg/dL 9.8 9.3  Total Protein 6.5 - 8.1 g/dL 8.2(H) 8.2(H)  Total Bilirubin 0.3 - 1.2 mg/dL 0.5 0.3  Alkaline Phos 38 - 126 U/L 150(H) 71  AST 15 - 41 U/L 27 29  ALT 0 - 44 U/L 26 36      RADIOGRAPHIC STUDIES: I have personally  reviewed the radiological images as listed and agreed with the findings in the report. No results found.   ASSESSMENT & PLAN:  Lindsay [name] is a 64 y.o. female with   1. 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 Her 12/15/19 MRI abdomen showed 2 large liver masses indicating metastatic disease and bulky porta hepatis and retroperitoneal adenopathy. -Her EUS from 12/22/19 showed 3.5cm LN in porta hepatis region and biopsy confirmed metastatic adenocarcinoma.  Preliminary cytology sample is not adequate for any additional IHC study for the origin of tumor.  I spoke with pathologist today, the morphology does not support HCC. -Her EGD was negative for malignancy.  Her colonoscopy in March 2021 did show a large 2 cm polyps in descending colon, biopsy showed high-grade dysplasia.   -I reviewed her liver biopsy results, which confirmed adenocarcinoma, immunostain showed positive CDX2 and CK20, negative CK7, supporting primary colorectal cancer  -I personally reviewed her PET scan images with patient in detail.  It showed no liver metastasis, and diffuse adenopathy in thoracic and abdomen. There is a hypermetabolic 1.8 cm pulmonary nodule in left upper lobe, and focal hypermetabolic lesion in the splenic fixture of the colon, concerning for primary tumor. -She is scheduled for flexible sigmoidoscopy next Monday by Dr. Benson Norway, I will reaching out to Dr. Benson Norway to see if we can change to full colonoscopy. -I also discussed with pathologist, to see if we need to rule out metastatic lung cancer.will add IHC of TTF on biopsy, and I also requested FO genomic testing. -I discussed her metastatic cancer is not curable, and treatment option would be systemic chemotherapy.  We reviewed overall prognosis, median survival of metastatic colorectal cancer, and the  benefit of chemotherapy.  She is interested in chemo -IR port placement in the next few days -Chemo class and follow-up on November 15, plan to start chemotherapy a few days after next visit.  2. Chronic Lower abdominal pain, Nausea  -She has had chronic low abdominal pain for 5 years ranging up to 4 times a week. She notes her pain worsened in the last 3 years, but no work-up until 2021.   -Her pain is 5/10 at most but when high not managed on Motrin. I will call in Tramadol today (01/01/20)  for severe pain. She can otherwise she Tylenol -For her intermittent nausea I called in Zofran today -I discussed to maintain weight she can continue Ensure with high protein/high calorie diet and remain active  -dietician referral    3. HTN, DM, Heart Murmur, Arthritis  -On medications. Managed by her PCP and Cardiologist Dr Einar Gip   4. 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  -She  lives with husband and has brothers and sister in and out of town. Her only child passed in recent years.  -She notes has hesitation with telling her family about her cancer, but willing to discuss with her husband, I encouraged her to bring her husband in on next visit    PLAN:  -IR port placement in 1-2 weeks -Colonoscopy by Dr. Benson Norway early next week -lab, chemo class and f/u 11/15, and chemo a few days after. Chemo regimen will be determined after her colonoscopy and more IHC on her liver biopsy  -tumor board discussion next week    No problem-specific Assessment & Plan notes found for this encounter.   No orders of the defined types were placed in this encounter.  All questions were answered. The patient knows to call the clinic with any problems, questions or concerns. No barriers to learning was detected. The total time spent in the appointment was 40 minutes.     [name], MD 01/12/2020   I, Joslyn Devon, am acting as scribe for [name], MD.   I have reviewed  the above documentation for accuracy and completeness, and I agree with the above.

## 2020-01-13 ENCOUNTER — Encounter: Payer: Self-pay | Admitting: Hematology

## 2020-01-15 ENCOUNTER — Ambulatory Visit: Payer: Medicare Other | Admitting: Hematology

## 2020-01-15 ENCOUNTER — Other Ambulatory Visit: Payer: Self-pay

## 2020-01-15 DIAGNOSIS — C221 Intrahepatic bile duct carcinoma: Secondary | ICD-10-CM

## 2020-01-16 ENCOUNTER — Ambulatory Visit (HOSPITAL_COMMUNITY): Payer: Medicare Other

## 2020-01-17 ENCOUNTER — Other Ambulatory Visit: Payer: Self-pay

## 2020-01-17 ENCOUNTER — Other Ambulatory Visit: Payer: Self-pay | Admitting: Hematology

## 2020-01-17 ENCOUNTER — Ambulatory Visit: Payer: Medicare Other | Admitting: Hematology

## 2020-01-17 LAB — SURGICAL PATHOLOGY

## 2020-01-17 NOTE — Progress Notes (Signed)
START ON PATHWAY REGIMEN - Colorectal     A cycle is every 14 days:     Bevacizumab-xxxx      Oxaliplatin      Leucovorin      Fluorouracil      Fluorouracil   **Always confirm dose/schedule in your pharmacy ordering system**  Patient Characteristics: Distant Metastases, Nonsurgical Candidate, [name]/[name] Mutation Positive/Unknown (BRAF V600 Wild-Type/Unknown), Standard Cytotoxic Therapy, First Line Standard Cytotoxic Therapy, Bevacizumab Eligible, PS = 0,1 Tumor Location: Colon Therapeutic Status: Distant Metastases Microsatellite/Mismatch Repair Status: Unknown BRAF Mutation Status: Awaiting Test Results [name]/[name] Mutation Status: Awaiting Test Results Standard Cytotoxic Line of Therapy: First Line Standard Cytotoxic Therapy ECOG Performance Status: 1 Bevacizumab Eligibility: Eligible Intent of Therapy: Non-Curative / Palliative Intent, Discussed with Patient 

## 2020-01-18 NOTE — Progress Notes (Signed)
Left voice message for IR requesting after her port is placed on 11/17 that it be left accessed as she is getting her first treatment the next day 11/18.   Received a call back stating that Dr. Earleen Newport would have to approve.  I have let Dr. Burr Medico know.

## 2020-01-19 NOTE — Progress Notes (Signed)
Pharmacist Chemotherapy Monitoring - Initial Assessment    Anticipated start date: 01/25/20  Regimen:  . Are orders appropriate based on the patient's diagnosis, regimen, and cycle? Yes . Does the plan date match the patient's scheduled date? Yes . Is the sequencing of drugs appropriate? Yes . Are the premedications appropriate for the patient's regimen? Yes . Prior Authorization for treatment is: Approved o If applicable, is the correct biosimilar selected based on the patient's insurance? yes  Organ Function and Labs: Marland Kitchen Are dose adjustments needed based on the patient's renal function, hepatic function, or hematologic function? Yes . Are appropriate labs ordered prior to the start of patient's treatment? Yes . Other organ system assessment, if indicated: N/A . The following baseline labs, if indicated, have been ordered: bevacizumab: urine protein  Dose Assessment: . Are the drug doses appropriate? Yes . Are the following correct: o Drug concentrations Yes o IV fluid compatible with drug Yes o Administration routes Yes o Timing of therapy Yes . If applicable, does the patient have documented access for treatment and/or plans for port-a-cath placement? yes . If applicable, have lifetime cumulative doses been properly documented and assessed? not applicable Lifetime Dose Tracking  No doses have been documented on this patient for the following tracked chemicals: Doxorubicin, Epirubicin, Idarubicin, Daunorubicin, Mitoxantrone, Bleomycin, Oxaliplatin, Carboplatin, Liposomal Doxorubicin  o   Toxicity Monitoring/Prevention: . The patient has the following take home antiemetics prescribed: Ondansetron . The patient has the following take home medications prescribed: N/A . Medication allergies and previous infusion related reactions, if applicable, have been reviewed and addressed. Yes . The patient's current medication list has been assessed for drug-drug interactions with their  chemotherapy regimen. no significant drug-drug interactions were identified on review.  Order Review: . Are the treatment plan orders signed? No . Is the patient scheduled to see a provider prior to their treatment? Yes  I verify that I have reviewed each item in the above checklist and answered each question accordingly.  Philomena Course 01/19/2020 11:13 AM

## 2020-01-21 NOTE — Progress Notes (Signed)
[place]   [phone] [phone]   Clinic Follow up Note   Patient Care Team: Lindsay Panda, MD as PCP - General (Internal Medicine) Lindsay Finner, RN as Oncology Nurse Navigator Lindsay Merle, MD as Consulting Physician (Oncology) Lindsay Ada, MD as Consulting Physician (Gastroenterology) 01/22/2020  CHIEF COMPLAINT: Follow-up metastatic cancer  SUMMARY OF ONCOLOGIC HISTORY: Oncology History Overview Note  Cancer Staging No matching staging information was found for the patient.    metastatic colon cancer to liver  06/20/2019 Procedure   Colonoscopy by Lindsay [name] 06/20/19  IMPRESSION -Seven 3 to 10 mm polyps in the sigmoid colon, in the transverse colon and in the escending colon, removed with a cold snare. Resected and retrireved.  -One 77mm polyp in the descending colon. Biopsies. Tattoes.  -Mediaum sized lipoma in the ascending colon.   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 See Comment B. Colon, Ascending, polyp, Polypectomy:  -TUBULAR ADENOMA -No high grade dysplasia or malignancy.  C. Colon, TRansverse, Polyo, polectomy:  -TUBULAR ADENOMA -No high grade dysplasia or malignancy.  D. Colon, Sigmoid, Polyp, Polypectomy:  -HYPERPLASTIC POLYP   11/07/2019 Imaging   US Abdomen 11/07/19  IMPRESSION: 1. Two solid masses in the liver are nonspecific. Recommend MRI abdomen with and without contrast for further evaluation.   12/15/2019 Imaging   MRI Abdomen 12/15/19  IMPRESSION: 1. There are two large masses in the liver with appearance favoring metastatic disease or hepatocellular carcinoma or cholangiocarcinoma. A benign etiology is highly unlikely given the enhancement pattern and associated adenopathy. 2. Considerable porta hepatis and retroperitoneal adenopathy. Some of the confluent porta hepatis tumor is potentially infiltrative and abuts  the pancreatic body along its right upper margin, making it difficult to completely exclude the possibility of pancreatic adenocarcinoma primary. Possibilities helpful in further workup might include tissue diagnosis, endoscopic ultrasound, or nuclear medicine PET-CT. 3. Pancreas divisum. 4. Lumbar spondylosis and degenerative disc disease. 5. Despite efforts by the technologist and patient, motion artifact is present on today's exam and could not be eliminated. This reduces exam sensitivity and specificity.   12/22/2019 Procedure   Upper Endoscopy by Lindsay [name] 12/22/19  IMPRESSION - One lymph node was visualized and measured in the porta hepatis region. Fine needle aspiration performed    12/22/2019 Initial Biopsy   A. LIVER, PORTA HEPATIS MASS, FINE NEEDLE 12/22/19 ASPIRATION:  FINAL MICROSCOPIC DIAGNOSIS:  - Malignant cells consistent with metastatic adenocarcinoma   01/01/2020 Initial Diagnosis   Intrahepatic cholangiocarcinoma ([place])   01/08/2020 Initial Biopsy   FINAL MICROSCOPIC DIAGNOSIS:   A. LIVER, LEFT LOBE, BIOPSY:  - Metastatic adenocarcinoma, consistent with a colorectal primary.  See  comment      COMMENT:   Immunohistochemical stains show the tumor cells are positive for CK20  and CDX2 but negative for CK7, consistent with above interpretation.  Lindsay. Burr Medico was notified on 01/10/2020   01/25/2020 -  Chemotherapy   The patient had dexamethasone (DECADRON) 4 MG tablet, 8 mg, Oral, Daily, 0 of 1 cycle, Start date: --, End date: -- palonosetron (ALOXI) injection 0.25 mg, 0.25 mg, Intravenous,  Once, 0 of 4 cycles leucovorin 792 mg in dextrose 5 % 250 mL infusion, 400 mg/m2 = 792 mg, Intravenous,  Once, 0 of 4 cycles oxaliplatin (ELOXATIN) 170 mg in dextrose 5 % 500 mL chemo infusion, 85 mg/m2 = 170 mg, Intravenous,  Once, 0 of 4 cycles fluorouracil (ADRUCIL) 4,750 mg in sodium  chloride 0.9 % 55 mL chemo infusion, 2,400 mg/m2 = 4,750 mg, Intravenous, 1 Day/Dose, 0 of  4 cycles bevacizumab-bvzr (ZIRABEV) 400 mg in sodium chloride 0.9 % 100 mL chemo infusion, 5 mg/kg, Intravenous,  Once, 0 of 2 cycles  for chemotherapy treatment.      CURRENT THERAPY: Pending first line FOLFOX starting 01/25/2020, plan to add bevacizumab with cycle 2 due to port placement  INTERVAL HISTORY: Lindsay [name] returns for follow-up with her husband as scheduled.  She is doing well, energy and appetite are adequate.  She remains able to do all her normal daily activities, up and out of bed/recliner more than half the day.  She has intermittent mild left lower quadrant pain that occasionally slows down her activity.  She has tried NSAIDs without much relief.  She gets constipated easily but none currently, takes Metamucil if needed.  Denies nausea, vomiting.  She has no baseline neuropathy, BG 120s-150s.  Not on insulin.  Denies any recent fever, chills, cough, chest pain, dyspnea.   MEDICAL HISTORY:  Past Medical History:  Diagnosis Date  . Arthritis   . Diabetes mellitus without complication ([place])   . Hypertension     SURGICAL HISTORY: Past Surgical History:  Procedure Laterality Date  . ABDOMINAL HYSTERECTOMY    . ANTERIOR AND POSTERIOR REPAIR WITH SACROSPINOUS FIXATION N/A 07/16/2016   Procedure: ANTERIOR AND POSTERIOR REPAIR WITH SACROSPINOUS FIXATION;  Surgeon: Everlene Farrier, MD;  Location: [facility];  Service: Gynecology;  Laterality: N/A;  . CYSTOSCOPY  07/16/2016   Procedure: CYSTOSCOPY;  Surgeon: Everlene Farrier, MD;  Location: [facility];  Service: Gynecology;;  . ESOPHAGOGASTRODUODENOSCOPY (EGD) WITH PROPOFOL N/A 12/22/2019   Procedure: ESOPHAGOGASTRODUODENOSCOPY (EGD) WITH PROPOFOL;  Surgeon: Lindsay Ada, MD;  Location: [facility];  Service: Endoscopy;  Laterality: N/A;  . FINE NEEDLE ASPIRATION N/A 12/22/2019   Procedure: FINE NEEDLE ASPIRATION (FNA) LINEAR;  Surgeon: Lindsay Ada, MD;  Location: [facility];  Service: Endoscopy;  Laterality: N/A;  . LAPAROSCOPIC  VAGINAL HYSTERECTOMY WITH SALPINGECTOMY Bilateral 07/16/2016   Procedure: LAPAROSCOPIC ASSISTED VAGINAL HYSTERECTOMY WITH SALPINGECTOMY;  Surgeon: Everlene Farrier, MD;  Location: [facility];  Service: Gynecology;  Laterality: Bilateral;  . MYOMECTOMY ABDOMINAL APPROACH    . THYROID SURGERY    . tyroid    . UPPER ESOPHAGEAL ENDOSCOPIC ULTRASOUND (EUS) N/A 12/22/2019   Procedure: UPPER ESOPHAGEAL ENDOSCOPIC ULTRASOUND (EUS);  Surgeon: Lindsay Ada, MD;  Location: [facility];  Service: Endoscopy;  Laterality: N/A;    I have reviewed the social history and family history with the patient and they are unchanged from previous note.  ALLERGIES:  has No Known Allergies.  MEDICATIONS:  Current Outpatient Medications  Medication Sig Dispense Refill  . aspirin EC 81 MG tablet Take 1 tablet (81 mg total) by mouth daily. 90 tablet 3  . atorvastatin (LIPITOR) 20 MG tablet Take 20 mg by mouth at bedtime.   1  . clobetasol cream (TEMOVATE) 1.61 % Apply 1 application topically daily.    Marland Kitchen esomeprazole (NEXIUM) 40 MG capsule Take 40 mg by mouth daily.    . ferrous sulfate 325 (65 FE) MG tablet Take 1 tablet by mouth daily.    Marland Kitchen lisinopril-hydrochlorothiazide (ZESTORETIC) 20-12.5 MG tablet Take 1 tablet by mouth daily with breakfast.     . ondansetron (ZOFRAN) 8 MG tablet Take 1 tablet (8 mg total) by mouth every 8 (eight) hours as needed for nausea or vomiting. 20 tablet 1  . spironolactone (ALDACTONE) 25 MG tablet Take 25 mg by  mouth daily with breakfast.  (Patient not taking: Reported on 01/22/2020)    . traMADol (ULTRAM) 50 MG tablet Take 1 tablet (50 mg total) by mouth every 6 (six) hours as needed. (Patient not taking: Reported on 01/22/2020) 15 tablet 0   No current facility-administered medications for this visit.    PHYSICAL EXAMINATION: ECOG PERFORMANCE STATUS: 1 - Symptomatic but completely ambulatory  Vitals:   01/22/20 1455  BP: (!) 173/78  Pulse: 66  Resp: 18  Temp: 98 F (36.7 C)    SpO2: 100%   Filed Weights   01/22/20 1455  Weight: 185 lb 12.8 oz (84.3 kg)    GENERAL:alert, no distress and comfortable SKIN: No rash EYES: sclera clear NECK: Without mass LUNGS: clear with normal breathing effort HEART: regular rate & rhythm, no lower extremity edema ABDOMEN:abdomen soft, non-tender and normal bowel sounds.  No hepatomegaly or mass NEURO: alert & oriented x 3 with fluent speech, no focal motor/sensory deficits  LABORATORY DATA:  I have reviewed the data as listed CBC Latest Ref Rng & Units 01/22/2020 01/05/2020 07/17/2016  WBC 4.0 - 10.5 K/uL 10.5 11.4(H) 15.6(H)  Hemoglobin 12.0 - 15.0 g/dL 13.0 13.1 12.3  Hematocrit 36 - 46 % 39.1 39.6 35.6(L)  Platelets 150 - 400 K/uL 353 330 320     CMP Latest Ref Rng & Units 01/22/2020 01/05/2020 07/09/2016  Glucose 70 - 99 mg/dL 107(H) 109(H) 107(H)  BUN 8 - 23 mg/dL 9 11 18   Creatinine 0.44 - 1.00 mg/dL 0.78 0.72 0.62  Sodium 135 - 145 mmol/L 142 141 133(L)  Potassium 3.5 - 5.1 mmol/L 3.6 3.7 3.5  Chloride 98 - 111 mmol/L 100 102 100(L)  CO2 22 - 32 mmol/L 31 28 24   Calcium 8.9 - 10.3 mg/dL 9.6 9.8 9.3  Total Protein 6.5 - 8.1 g/dL 8.3(H) 8.2(H) 8.2(H)  Total Bilirubin 0.3 - 1.2 mg/dL 0.4 0.5 0.3  Alkaline Phos 38 - 126 U/L 158(H) 150(H) 71  AST 15 - 41 U/L 22 27 29   ALT 0 - 44 U/L 22 26 36      RADIOGRAPHIC STUDIES: I have personally reviewed the radiological images as listed and agreed with the findings in the report. No results found.   ASSESSMENT & PLAN: Lindsay [name] is a 64 y.o. female with   1. 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 -Her EUS from 12/22/19 showed 3.5cm LN in porta hepatis regionand biopsy confirmed metastatic adenocarcinoma.Preliminary cytology sample is not adequate for any additional IHC study for the  origin of tumor, the morphology does not support HCC. -Her EGD was negative formalignancy. Her colonoscopy in March 2021 didshow a large 2 cm polyps in descending colon, biopsy showed high-grade dysplasia. -Liver biopsy confirmed adenocarcinoma, immunostain showed positive CDX2 and CK20, negative CK7, supporting primary colorectal cancer  -PET scan showed known liver metastasis, and diffuse thoracic and abdominal adenopathy. There is a hypermetabolic 1.8 cm pulmonary nodule in left upper lobe, and focal hypermetabolic lesion in the splenic fixture of the colon, concerning for primary tumor. -She underwent repeat colonoscopy and biopsy by Lindsay. Benson [name] last week, I have requested the report and path.  -additional IHC testing with TTF-1 shows focal staining, which can be seen in 5-10% of colorectal primary cancers as well as lung cancer. However given the above, the overall picture is still consistent with colorectal primary.  -FO is pending -  pending port placement and first cycle systemic chemo starting this week. Chemo education class today.  2. Chronic Lower abdominal pain, Nausea  -She has had chronic low abdominal pain for 5 years ranging up to 4 times a week. She notes her pain worsened in the last 3 years, but no work-up until 2021.  -Her pain is 5/10 at most but when high not managed on Motrin.  -She was encouraged to try Tylenol for mild pain and tramadol for severe pain, prescription was sent on 01/01/2020 but she has not started yet.  I encouraged her to try, watch for constipation, and use Colace and MiraLAX twice daily when taking tramadol given that she is prone to constipation -I reviewed the importance of maintaining her weight and nutrition on treatment, encouraged her to use Ensure/boost if she has decreased appetite  3. HTN, DM, Heart Murmur, Arthritis  -Per PCP and Cardiologist Lindsay Einar Gip -DM controlled, no pre-existing neuropathy -We will monitor for elevated BG and neuropathy  on chemo -she knows to hold aspirin for port placement  4. 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  -She lives with husband and has brothers and sister in and out of town. Her only child passed in recent years.  -Her husband was present for today's visit  5. Goals of care -we discussed the incurable but treatable nature of her stage IV cancer and general prognosis. She understands the prognosis is poor if she does not tolerate or respond well to chemo -she understands the goal is palliative  -code status not addressed today  Disposition: Ms. Denio appears stable.  She has mild intermittent LLQ pain and constipation.  We reviewed symptom management.  She has a good performance status.  The CBC and CMP are stable, adequate for treatment.  I reviewed additional IHC testing for TTF-1-1 which shows focal staining within the tumor cells.  We are awaiting the colonoscopy and additional biopsy report from Lindsay. Benson [name].  The PET scan, liver biopsy and immunostains, and the overall clinical picture remain consistent with a colorectal primary.   Lindsay. Burr Medico has recommended first-line systemic chemo with FOLFOX every 2 weeks with biological agent bevacizumab. Chemotherapy consent: Side effects including but not not limited to (FOLFOX) fatigue, nausea/vomiting, constipation/diarrhea, hair thinning/loss, cold sensitivity, neuropathy, fluid retention, renal and liver dysfunction, neutropenic fever, need for blood transfusion, bleeding, (bevacizumab) HTN, proteinuria, thrombosis, and intestinal bleeding were discussed with patient and family in great detail. She agrees to proceed. We reviewed symptom management and call/return precautions. She understands the goal is palliative, to control her disease, improve her symptoms, and prolong her life.   She will have PAC placed 01/24/20, first chemo 11/18. She will have virtual toxicity check next week, then f/u in 2 weeks with cycle 2.  Plan to add bevacizumab with cycle 2 due to recent port placement. Will trend CEA and restage after 4-6 cycles.   The plan was reviewed with Lindsay. Burr Medico. All questions were answered. The patient knows to call the clinic with any problems, questions or concerns. No barriers to learning were detected. Total encounter time was 40 minutes.      Alla Feeling, NP 01/22/20

## 2020-01-22 ENCOUNTER — Encounter: Payer: Self-pay | Admitting: Nurse Practitioner

## 2020-01-22 ENCOUNTER — Inpatient Hospital Stay (HOSPITAL_BASED_OUTPATIENT_CLINIC_OR_DEPARTMENT_OTHER): Payer: Medicare Other | Admitting: Nurse Practitioner

## 2020-01-22 ENCOUNTER — Inpatient Hospital Stay: Payer: Medicare Other

## 2020-01-22 ENCOUNTER — Other Ambulatory Visit: Payer: Self-pay

## 2020-01-22 VITALS — BP 173/78 | HR 66 | Temp 98.0°F | Resp 18 | Ht 66.0 in | Wt 185.8 lb

## 2020-01-22 DIAGNOSIS — C189 Malignant neoplasm of colon, unspecified: Secondary | ICD-10-CM

## 2020-01-22 DIAGNOSIS — Z5111 Encounter for antineoplastic chemotherapy: Secondary | ICD-10-CM | POA: Diagnosis not present

## 2020-01-22 DIAGNOSIS — C221 Intrahepatic bile duct carcinoma: Secondary | ICD-10-CM

## 2020-01-22 LAB — CBC WITH DIFFERENTIAL (CANCER CENTER ONLY)
Abs Immature Granulocytes: 0.03 10*3/uL (ref 0.00–0.07)
Basophils Absolute: 0.1 10*3/uL (ref 0.0–0.1)
Basophils Relative: 1 %
Eosinophils Absolute: 0.3 10*3/uL (ref 0.0–0.5)
Eosinophils Relative: 3 %
HCT: 39.1 % (ref 36.0–46.0)
Hemoglobin: 13 g/dL (ref 12.0–15.0)
Immature Granulocytes: 0 %
Lymphocytes Relative: 17 %
Lymphs Abs: 1.8 10*3/uL (ref 0.7–4.0)
MCH: 29.8 pg (ref 26.0–34.0)
MCHC: 33.2 g/dL (ref 30.0–36.0)
MCV: 89.7 fL (ref 80.0–100.0)
Monocytes Absolute: 0.9 10*3/uL (ref 0.1–1.0)
Monocytes Relative: 9 %
Neutro Abs: 7.5 10*3/uL (ref 1.7–7.7)
Neutrophils Relative %: 70 %
Platelet Count: 353 10*3/uL (ref 150–400)
RBC: 4.36 MIL/uL (ref 3.87–5.11)
RDW: 13.3 % (ref 11.5–15.5)
WBC Count: 10.5 10*3/uL (ref 4.0–10.5)
nRBC: 0 % (ref 0.0–0.2)

## 2020-01-22 LAB — CMP (CANCER CENTER ONLY)
ALT: 22 U/L (ref 0–44)
AST: 22 U/L (ref 15–41)
Albumin: 3.9 g/dL (ref 3.5–5.0)
Alkaline Phosphatase: 158 U/L — ABNORMAL HIGH (ref 38–126)
Anion gap: 11 (ref 5–15)
BUN: 9 mg/dL (ref 8–23)
CO2: 31 mmol/L (ref 22–32)
Calcium: 9.6 mg/dL (ref 8.9–10.3)
Chloride: 100 mmol/L (ref 98–111)
Creatinine: 0.78 mg/dL (ref 0.44–1.00)
GFR, Estimated: >60 mL/min (ref >60)
Glucose, Bld: 107 mg/dL — ABNORMAL HIGH (ref 70–99)
Potassium: 3.6 mmol/L (ref 3.5–5.1)
Sodium: 142 mmol/L (ref 135–145)
Total Bilirubin: 0.4 mg/dL (ref 0.3–1.2)
Total Protein: 8.3 g/dL — ABNORMAL HIGH (ref 6.5–8.1)

## 2020-01-23 ENCOUNTER — Telehealth: Payer: Self-pay | Admitting: Nurse Practitioner

## 2020-01-23 ENCOUNTER — Other Ambulatory Visit: Payer: Self-pay | Admitting: Radiology

## 2020-01-23 NOTE — Telephone Encounter (Signed)
Scheduled per 11/15 los. Unable to reach pt. Left voicemail with appt times and dates.

## 2020-01-24 ENCOUNTER — Ambulatory Visit (HOSPITAL_COMMUNITY)
Admission: RE | Admit: 2020-01-24 | Discharge: 2020-01-24 | Disposition: A | Discharge status: Home / Self Care | Payer: Medicare Other | Source: Ambulatory Visit | Attending: Hematology | Admitting: Hematology

## 2020-01-24 ENCOUNTER — Encounter (HOSPITAL_COMMUNITY): Payer: Self-pay

## 2020-01-24 ENCOUNTER — Other Ambulatory Visit: Payer: Self-pay

## 2020-01-24 DIAGNOSIS — C779 Secondary and unspecified malignant neoplasm of lymph node, unspecified: Secondary | ICD-10-CM | POA: Diagnosis not present

## 2020-01-24 DIAGNOSIS — C221 Intrahepatic bile duct carcinoma: Secondary | ICD-10-CM

## 2020-01-24 DIAGNOSIS — C7800 Secondary malignant neoplasm of unspecified lung: Secondary | ICD-10-CM | POA: Diagnosis not present

## 2020-01-24 DIAGNOSIS — C189 Malignant neoplasm of colon, unspecified: Secondary | ICD-10-CM | POA: Insufficient documentation

## 2020-01-24 DIAGNOSIS — Z79899 Other long term (current) drug therapy: Secondary | ICD-10-CM | POA: Diagnosis not present

## 2020-01-24 DIAGNOSIS — C787 Secondary malignant neoplasm of liver and intrahepatic bile duct: Secondary | ICD-10-CM | POA: Diagnosis not present

## 2020-01-24 DIAGNOSIS — Z87891 Personal history of nicotine dependence: Secondary | ICD-10-CM | POA: Insufficient documentation

## 2020-01-24 HISTORY — PX: IR IMAGING GUIDED PORT INSERTION: IMG5740

## 2020-01-24 LAB — GLUCOSE, CAPILLARY: Glucose-Capillary: 139 mg/dL — ABNORMAL HIGH (ref 70–99)

## 2020-01-24 MED ORDER — MIDAZOLAM HCL 2 MG/2ML IJ SOLN
INTRAMUSCULAR | Status: AC
  Filled 2020-01-24: qty 2

## 2020-01-24 MED ORDER — FENTANYL CITRATE (PF) 100 MCG/2ML IJ SOLN
INTRAMUSCULAR | Status: AC
  Filled 2020-01-24: qty 2

## 2020-01-24 MED ORDER — HEPARIN SOD (PORK) LOCK FLUSH 100 UNIT/ML IV SOLN
INTRAVENOUS | Status: AC | PRN
  Administered 2020-01-24: 500 [IU] via INTRAVENOUS

## 2020-01-24 MED ORDER — CEFAZOLIN SODIUM-DEXTROSE 2-4 GM/100ML-% IV SOLN
INTRAVENOUS | Status: AC
  Administered 2020-01-24: 2 g via INTRAVENOUS
  Filled 2020-01-24: qty 100

## 2020-01-24 MED ORDER — SODIUM CHLORIDE 0.9 % IV SOLN: INTRAVENOUS | Status: DC

## 2020-01-24 MED ORDER — FENTANYL CITRATE (PF) 100 MCG/2ML IJ SOLN
INTRAMUSCULAR | Status: AC | PRN
Start: 2020-01-24 — End: 2020-01-24
  Administered 2020-01-24 (×2): 50 ug via INTRAVENOUS

## 2020-01-24 MED ORDER — LIDOCAINE HCL (PF) 1 % IJ SOLN
INTRAMUSCULAR | Status: AC | PRN
  Administered 2020-01-24 (×2): 10 mL via INTRADERMAL

## 2020-01-24 MED ORDER — LIDOCAINE HCL 1 % IJ SOLN
INTRAMUSCULAR | Status: AC
  Filled 2020-01-24: qty 20

## 2020-01-24 MED ORDER — HEPARIN SOD (PORK) LOCK FLUSH 100 UNIT/ML IV SOLN
INTRAVENOUS | Status: AC
  Filled 2020-01-24: qty 5

## 2020-01-24 MED ORDER — CEFAZOLIN SODIUM-DEXTROSE 2-4 GM/100ML-% IV SOLN: 2.0000 g | Freq: Once | INTRAVENOUS | Status: AC

## 2020-01-24 MED ORDER — MIDAZOLAM HCL 2 MG/2ML IJ SOLN
INTRAMUSCULAR | Status: AC | PRN
  Administered 2020-01-24 (×2): 1 mg via INTRAVENOUS

## 2020-01-24 NOTE — H&P (Signed)
Chief Complaint: Patient was seen in consultation today for metastatic colorectal cancer  Referring Physician(s): Feng,Yan  Supervising Physician: Aletta Edouard  Patient Status: [name] - Out-[facility]  History of Present Illness: Lindsay [name] is a 64 y.o. 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 She has plans for upcoming chemotherapy initiation.  IR consulted for Port-A-Cath placement at the request of Dr. Burr Medico.   Mrs. Husband presents to [facility] today for Port-A-Cath placement.  She is in her usual state of health.  She has been NPO today.  Denies fever, chills, cough, shortness of breath, abdominal pain, nausea, vomiting, dysuria.   Past Medical History:  Diagnosis Date  . Arthritis   . Diabetes mellitus without complication ([place])   . Hypertension     Past Surgical History:  Procedure Laterality Date  . ABDOMINAL HYSTERECTOMY    . ANTERIOR AND POSTERIOR REPAIR WITH SACROSPINOUS FIXATION N/A 07/16/2016   Procedure: ANTERIOR AND POSTERIOR REPAIR WITH SACROSPINOUS FIXATION;  Surgeon: Everlene Farrier, MD;  Location: [facility];  Service: Gynecology;  Laterality: N/A;  . CYSTOSCOPY  07/16/2016   Procedure: CYSTOSCOPY;  Surgeon: Everlene Farrier, MD;  Location: [facility];  Service: Gynecology;;  . ESOPHAGOGASTRODUODENOSCOPY (EGD) WITH PROPOFOL N/A 12/22/2019   Procedure: ESOPHAGOGASTRODUODENOSCOPY (EGD) WITH PROPOFOL;  Surgeon: [name], MD;  Location: [facility];  Service: Endoscopy;  Laterality: N/A;  . FINE NEEDLE ASPIRATION N/A 12/22/2019   Procedure: FINE NEEDLE ASPIRATION (FNA) LINEAR;  Surgeon: [name], MD;  Location: [facility];  Service: Endoscopy;  Laterality: N/A;  . LAPAROSCOPIC VAGINAL HYSTERECTOMY WITH SALPINGECTOMY Bilateral 07/16/2016   Procedure: LAPAROSCOPIC ASSISTED VAGINAL HYSTERECTOMY WITH SALPINGECTOMY;  Surgeon:  Everlene Farrier, MD;  Location: [facility];  Service: Gynecology;  Laterality: Bilateral;  . MYOMECTOMY ABDOMINAL APPROACH    . THYROID SURGERY    . tyroid    . UPPER ESOPHAGEAL ENDOSCOPIC ULTRASOUND (EUS) N/A 12/22/2019   Procedure: UPPER ESOPHAGEAL ENDOSCOPIC ULTRASOUND (EUS);  Surgeon: [name], MD;  Location: [facility];  Service: Endoscopy;  Laterality: N/A;    Allergies: Patient has no known allergies.  Medications: Prior to Admission medications   Medication Sig Start Date End Date Taking? Authorizing Provider  atorvastatin (LIPITOR) 20 MG tablet Take 20 mg by mouth at bedtime.  07/03/16  Yes [provider]  clobetasol cream (TEMOVATE) 9.39 % Apply 1 application topically daily.   Yes [provider]  ferrous sulfate 325 (65 FE) MG tablet Take 1 tablet by mouth daily.   Yes [provider]  lisinopril-hydrochlorothiazide (ZESTORETIC) 20-12.5 MG tablet Take 1 tablet by mouth daily with breakfast.    Yes [provider]  traMADol (ULTRAM) 50 MG tablet Take 1 tablet (50 mg total) by mouth every 6 (six) hours as needed. 01/01/20  Yes [name], MD  aspirin EC 81 MG tablet Take 1 tablet (81 mg total) by mouth daily. 10/12/19   Adrian Prows, MD  esomeprazole (NEXIUM) 40 MG capsule Take 40 mg by mouth daily. 09/26/19   [provider]  ondansetron (ZOFRAN) 8 MG tablet Take 1 tablet (8 mg total) by mouth every 8 (eight) hours as needed for nausea or vomiting. 01/01/20   [name], MD  spironolactone (ALDACTONE) 25 MG tablet Take 25 mg by mouth daily with breakfast.  Patient not taking: Reported on 01/22/2020 09/19/19   [provider]     Family History  Problem Relation Age of Onset  . Aneurysm Mother   . Asthma Father     Social History   Socioeconomic History  . Marital status: Married    Spouse name: Not on file  . Number of children: 1  . Years of education: Not on file  . Highest education level: Not on file  Occupational  History  . Occupation: retired Glass blower/designer   Tobacco Use  . Smoking status: Former Smoker    Packs/day: 0.25    Years: 18.00    Pack years: 4.50    Types: Cigarettes    Quit date: 06/06/2018    Years since quitting: 1.6  . Smokeless tobacco: Never Used  Vaping Use  . Vaping Use: Never used  Substance and Sexual Activity  . Alcohol use: Yes    Alcohol/week: 3.0 standard drinks    Types: 3 Shots of liquor per week    Comment: socially  . Drug use: No  . Sexual activity: Yes  Other Topics Concern  . Not on file  Social History Narrative  . Not on file   Social Determinants of Health   Financial Resource Strain:   . Difficulty of Paying Living Expenses: Not on file  Food Insecurity:   . Worried About Charity fundraiser in the Last Year: Not on file  . Ran Out of Food in the Last Year: Not on file  Transportation Needs:   . Lack of Transportation (Medical): Not on file  . Lack of Transportation (Non-Medical): Not on file  Physical Activity:   . Days of Exercise per Week: Not on file  . Minutes of Exercise per Session: Not on file  Stress:   . Feeling of Stress : Not on file  Social Connections:   . Frequency of Communication with Friends and Family: Not on file  . Frequency of Social Gatherings with Friends and Family: Not on file  . Attends Religious Services: Not on file  . Active Member of Clubs or Organizations: Not on file  . Attends Archivist Meetings: Not on file  . Marital Status: Not on file     Review of Systems: A 12 point ROS discussed and pertinent positives are indicated in the HPI above.  All other systems are negative.  Review of Systems  Constitutional: Negative for fatigue and fever.  Respiratory: Negative for cough and shortness of breath.   Cardiovascular: Negative for chest pain.  Gastrointestinal: Negative for abdominal pain, nausea and vomiting.  Genitourinary: Negative for dysuria.  Musculoskeletal: Negative for back pain.    Psychiatric/Behavioral: Negative for behavioral problems and confusion.    Vital Signs: BP (!) 153/101 (BP Location: Right Arm)   Pulse 87   Temp 98.1 F (36.7 C) (Oral)   Resp 17   SpO2 94%   Physical Exam Vitals and nursing note reviewed.  Constitutional:      General: She is not in acute distress.    Appearance: Normal appearance. She is not ill-appearing.  HENT:     Mouth/Throat:     Mouth: Mucous membranes are moist.     Pharynx: Oropharynx is clear.  Cardiovascular:     Rate and Rhythm: Normal rate and regular rhythm.  Pulmonary:     Effort: Pulmonary effort is normal. No respiratory distress.     Breath sounds: Normal breath sounds.  Abdominal:     General: Abdomen is flat.     Palpations: Abdomen is soft.  Skin:    General: Skin  is warm and dry.  Neurological:     General: No focal deficit present.     Mental Status: She is alert and oriented to person, place, and time. Mental status is at baseline.  Psychiatric:        Mood and Affect: Mood normal.        Behavior: Behavior normal.        Thought Content: Thought content normal.        Judgment: Judgment normal.      MD Evaluation Airway: WNL Heart: WNL Abdomen: WNL Chest/ Lungs: WNL ASA  Classification: 3 Mallampati/Airway Score: Two   Imaging: NM PET Image Initial (PI) Skull Base To Thigh  Result Date: 01/12/2020 CLINICAL DATA:  Subsequent treatment strategy for metastatic adenocarcinoma. EXAM: NUCLEAR MEDICINE PET SKULL BASE TO THIGH TECHNIQUE: 9.4 mCi F-18 FDG was injected intravenously. Full-ring PET imaging was performed from the skull base to thigh after the radiotracer. CT data was obtained and used for attenuation correction and anatomic localization. Fasting blood glucose: 115 mg/dl COMPARISON:  MR abdomen 12/15/2019. FINDINGS: Mediastinal blood pool activity: SUV max 2.5 Liver activity: SUV max NA NECK: No hypermetabolic lymph nodes in the neck. Incidental CT findings: none CHEST:  Hypermetabolic right supraclavicular, mediastinal, and hilar lymph nodes are consistent with metastatic disease. Index hypermetabolic right paratracheal node on image 56/series 4 is 18 mm short axis with SUV max = 9.0. 2.3 cm short axis subcarinal node on 69/4 demonstrates SUV max = 8.8. 1.8 cm left upper lobe pulmonary nodule is hypermetabolic with SUV max = 4.6. Incidental CT findings: Coronary artery calcification is evident. Atherosclerotic calcification is noted in the wall of the thoracic aorta. Patchy airspace disease is seen in the right middle lobe and both lower lobes, potentially atelectatic although infectious/inflammatory etiology is not excluded. Changes are not substantially hypermetabolic do show low level FDG accumulation. ABDOMEN/PELVIS: Multiple hypermetabolic liver lesions evident including 8.4 x 7.0 cm lesion in the dome of the liver on image 92/series 4. This lesion has SUV max = 15.8. 6.8 cm left hepatic lesion demonstrates SUV max = 13.2. 1.9 cm short axis gastrohepatic ligament lymph node on 100/4 demonstrates SUV max = 5.3. Bulky lymphadenopathy in the hepatoduodenal ligament is hypermetabolic with SUV max = 9.6. Retroperitoneal/para-aortic lymphadenopathy is hypermetabolic with SUV max = 8.1. 14 mm short axis right common iliac node (image 43/4) shows SUV max = 8.5. Mild there is relatively diffuse FDG accumulation in the transverse colon, a focal hypermetabolic region is noted in the splenic flexure, corresponding to soft tissue density seen on image 93 of series 4. This is somewhat difficult to assess on axial only imaging of today's exam with SUV max = 10.3 in this region. Hypermetabolism noted in multiple adjacent small bowel loops of the right lower pelvis, presumably physiologic. Incidental CT findings: There is abdominal aortic atherosclerosis without aneurysm. SKELETON: No focal hypermetabolic activity to suggest skeletal metastasis. Incidental CT findings: Status post right hip  replacement. Degenerative changes noted left acetabulum IMPRESSION: 1. Hypermetabolic metastatic lymphadenopathy identified in the right supraclavicular region, mediastinum, both hilar regions, abdomen, and right common iliac chain of the pelvis. 2. Patient's known bulky liver disease is hypermetabolic consistent with metastatic involvement. 3. Focal hypermetabolism in the splenic flexure of the colon with a question of confluent soft tissue lesion at this location although assessment is limited by axial imaging today. This raises the question of a primary site of colorectal neoplasm although not definite. Patient is noted have relatively diffuse FDG accumulation  in the transverse colon. 4. Hypermetabolic left upper lobe pulmonary nodule consistent with metastatic disease. 5. Patchy areas of airspace opacity in both lungs with low level FDG accumulation. Imaging features suggest infectious/inflammatory etiology. 6.  Aortic Atherosclerois (ICD10-170.0) Electronically Signed   By: Misty Stanley M.D.   On: 01/12/2020 13:06   US BIOPSY (LIVER)  Result Date: 01/08/2020 INDICATION: Hepatic masses. EXAM: ULTRASOUND GUIDED CORE BIOPSY OF LIVER MASS MEDICATIONS: None. ANESTHESIA/SEDATION: Fentanyl 100 mcg IV; Versed 2.0 mg IV Moderate Sedation Time:  16 minutes. The patient was continuously monitored during the procedure by the interventional radiology nurse under my direct supervision. PROCEDURE: The procedure, risks, benefits, and alternatives were explained to the patient. Questions regarding the procedure were encouraged and answered. The patient understands and consents to the procedure. A time-out was performed prior to initiating the procedure. Ultrasound was used to localize hepatic masses. The abdominal wall was prepped with chlorhexidine in a sterile fashion, and a sterile drape was applied covering the operative field. A sterile gown and sterile gloves were used for the procedure. Local anesthesia was provided  with 1% Lidocaine. Under ultrasound guidance, a 17 gauge trocar needle was advanced to the level of a left lobe hepatic mass. After confirming needle tip position, 3 separate coaxial 18 gauge core biopsy samples were obtained and submitted in formalin. A slurry of Gel-Foam pledgets mixed in sterile saline was then injected via the outer needle as the needle was removed. Additional ultrasound was performed. COMPLICATIONS: None immediate. FINDINGS: Large left lobe subcapsular mass again identified measuring up to 7 cm by MRI. This mass appears to be partially calcified by ultrasound demonstrating some areas of posterior acoustic shadowing. The central hepatic mass occupying portions of both right and left lobes was much deeper in location by ultrasound and therefore the left lobe mass was sampled. Solid tissue was obtained. IMPRESSION: Ultrasound-guided core biopsy performed of a mass within the left lobe of the liver. Electronically Signed   By: Aletta Edouard M.D.   On: 01/08/2020 15:15    Labs:  CBC: Recent Labs    01/05/20 1350 01/22/20 1442  WBC 11.4* 10.5  HGB 13.1 13.0  HCT 39.6 39.1  PLT 330 353    COAGS: Recent Labs    01/08/20 1144  INR 1.1    BMP: Recent Labs    01/05/20 1350 01/22/20 1442  NA 141 142  K 3.7 3.6  CL 102 100  CO2 28 31  GLUCOSE 109* 107*  BUN 11 9  CALCIUM 9.8 9.6  CREATININE 0.72 0.78  GFRNONAA >60 >60    LIVER FUNCTION TESTS: Recent Labs    01/05/20 1350 01/22/20 1442  BILITOT 0.5 0.4  AST 27 22  ALT 26 22  ALKPHOS 150* 158*  PROT 8.2* 8.3*  ALBUMIN 4.0 3.9    TUMOR MARKERS: No results for input(s): AFPTM, CEA, CA199, CHROMGRNA in the last 8760 hours.  Assessment and Plan: Patient with past medical history of HTN, DM presents with complaint of colorectal cancer, metastatic to liver and lymph nodes.  IR consulted for Port-A-Cath placement at the request of Dr. Burr Medico. Case reviewed by Dr. Kathlene Cote who approves patient for procedure.    Patient presents today in their usual state of health.  She has been NPO and is not currently on blood thinners.   Risks and benefits of image guided port-a-catheter placement was discussed with the patient including, but not limited to bleeding, infection, pneumothorax, or fibrin sheath development and need for additional  procedures.  All of the patient's questions were answered, patient is agreeable to proceed. Consent signed and in chart.   Thank you for this interesting consult.  I greatly enjoyed meeting Lindsay [name] and look forward to participating in their care.  A copy of this report was sent to the requesting provider on this date.  Electronically Signed: Docia Barrier, PA 01/24/2020, 2:34 PM   I spent a total of  30 Minutes   in face to face in clinical consultation, greater than 50% of which was counseling/coordinating care for metastatic colorectal cancer.

## 2020-01-24 NOTE — Discharge Instructions (Signed)
Urgent needs - IR on call MD [phone]  Wound - May remove dressing and shower in 24 to 48 hours.  Keep site clean and dry.  Replace with bandaid. Do not submerge in tub or water until site healing well.  If ordered by your provider, may start Emla cream in 2 weeks or after incision is healed.  Your provider should have you set up for monthly port flushes.    Implanted Port Insertion, Care After This sheet gives you information about how to care for yourself after your procedure. Your health care provider may also give you more specific instructions. If you have problems or questions, contact your health care provider. What can I expect after the procedure? After the procedure, it is common to have:  Discomfort at the port insertion site.  Bruising on the skin over the port. This should improve over 3-4 days. Follow these instructions at home: Port care  After your port is placed, you will get a manufacturer's information card. The card has information about your port. Keep this card with you at all times.  Take care of the port as told by your health care provider. Ask your health care provider if you or a family member can get training for taking care of the port at home. A home health care nurse may also take care of the port.  Make sure to remember what type of port you have. Incision care  Follow instructions from your health care provider about how to take care of your port insertion site. Make sure you: ? Wash your hands with soap and water before and after you change your bandage (dressing). If soap and water are not available, use hand sanitizer. ? Change your dressing as told by your health care provider. ? Leave stitches (sutures), skin glue, or adhesive strips in place. These skin closures may need to stay in place for 2 weeks or longer. If adhesive strip edges start to loosen and curl up, you may trim the loose edges. Do not remove adhesive strips completely unless your  health care provider tells you to do that.  Check your port insertion site every day for signs of infection. Check for: ? Redness, swelling, or pain. ? Fluid or blood. ? Warmth. ? Pus or a bad smell. Activity  Return to your normal activities as told by your health care provider. Ask your health care provider what activities are safe for you.  Do not lift anything that is heavier than 10 lb (4.5 kg), or the limit that you are told, until your health care provider says that it is safe. General instructions  Take over-the-counter and prescription medicines only as told by your health care provider.  Do not take baths, swim, or use a hot tub until your health care provider approves. Ask your health care provider if you may take showers. You may only be allowed to take sponge baths.  Do not drive for 24 hours if you were given a sedative during your procedure.  Wear a medical alert bracelet in case of an emergency. This will tell any health care providers that you have a port.  Keep all follow-up visits as told by your health care provider. This is important. Contact a health care provider if:  You cannot flush your port with saline as directed, or you cannot draw blood from the port.  You have a fever or chills.  You have redness, swelling, or pain around your port insertion site.  You   have fluid or blood coming from your port insertion site.  Your port insertion site feels warm to the touch.  You have pus or a bad smell coming from the port insertion site. Get help right away if:  You have chest pain or shortness of breath.  You have bleeding from your port that you cannot control. Summary  Take care of the port as told by your health care provider. Keep the manufacturer's information card with you at all times.  Change your dressing as told by your health care provider.  Contact a health care provider if you have a fever or chills or if you have redness, swelling, or  pain around your port insertion site.  Keep all follow-up visits as told by your health care provider. This information is not intended to replace advice given to you by your health care provider. Make sure you discuss any questions you have with your health care provider. Document Revised: 09/21/2017 Document Reviewed: 09/21/2017 Elsevier Patient Education  2020 [employer] Inc[employer]   Moderate Conscious Sedation, Adult, Care After These instructions provide you with information about caring for yourself after your procedure. Your health care provider may also give you more specific instructions. Your treatment has been planned according to current medical practices, but problems sometimes occur. Call your health care provider if you have any problems or questions after your procedure. What can I expect after the procedure? After your procedure, it is common:  To feel sleepy for several hours.  To feel clumsy and have poor balance for several hours.  To have poor judgment for several hours.  To vomit if you eat too soon. Follow these instructions at home: For at least 24 hours after the procedure:  Do not: ? Participate in activities where you could fall or become injured. ? Drive. ? Use heavy machinery. ? Drink alcohol. ? Take sleeping pills or medicines that cause drowsiness. ? Make important decisions or sign legal documents. ? Take care of children on your own.  Rest. Eating and drinking  Follow the diet recommended by your health care provider.  If you vomit: ? Drink water, juice, or soup when you can drink without vomiting. ? Make sure you have little or no nausea before eating solid foods. General instructions  Have a responsible adult stay with you until you are awake and alert.  Take over-the-counter and prescription medicines only as told by your health care provider.  If you smoke, do not smoke without supervision.  Keep all follow-up visits as told by your health  care provider. This is important. Contact a health care provider if:  You keep feeling nauseous or you keep vomiting.  You feel light-headed.  You develop a rash.  You have a fever. Get help right away if:  You have trouble breathing. This information is not intended to replace advice given to you by your health care provider. Make sure you discuss any questions you have with your health care provider. Document Revised: 02/05/2017 Document Reviewed: 06/15/2015 Elsevier Patient Education  2020 Elsevier Inc.    

## 2020-01-24 NOTE — Procedures (Addendum)
Interventional Radiology Procedure Note  Procedure: Single Lumen Power Port Placement    Access:  Right IJ vein.  Findings: Catheter tip positioned at SVC/RA junction. Port is ready for immediate use.   Complications: None  EBL: < 10 mL  Recommendations:  - Ok to shower in 24 hours - Do not submerge for 7 days - Routine line care   Amera Banos T. Chrisanne Loose, M.D Pager:  [phone]   

## 2020-01-25 ENCOUNTER — Inpatient Hospital Stay: Payer: Medicare Other

## 2020-01-25 ENCOUNTER — Other Ambulatory Visit: Payer: Self-pay

## 2020-01-25 ENCOUNTER — Encounter (HOSPITAL_COMMUNITY): Payer: Self-pay | Admitting: Hematology

## 2020-01-25 VITALS — BP 125/75 | HR 74 | Temp 98.7°F | Resp 18

## 2020-01-25 DIAGNOSIS — C221 Intrahepatic bile duct carcinoma: Secondary | ICD-10-CM

## 2020-01-25 DIAGNOSIS — Z5111 Encounter for antineoplastic chemotherapy: Secondary | ICD-10-CM | POA: Diagnosis not present

## 2020-01-25 DIAGNOSIS — Z95828 Presence of other vascular implants and grafts: Secondary | ICD-10-CM

## 2020-01-25 MED ORDER — SODIUM CHLORIDE 0.9 % IV SOLN
2400.0000 mg/m2 | INTRAVENOUS | Status: DC
  Filled 2020-01-25: qty 95

## 2020-01-25 MED ORDER — PROCHLORPERAZINE MALEATE 10 MG PO TABS: 10.0000 mg | ORAL_TABLET | Freq: Four times a day (QID) | ORAL | 0 refills | Status: DC | PRN

## 2020-01-25 MED ORDER — SODIUM CHLORIDE 0.9 % IV SOLN
10.0000 mg | Freq: Once | INTRAVENOUS | Status: AC
  Administered 2020-01-25: 10 mg via INTRAVENOUS
  Filled 2020-01-25: qty 10

## 2020-01-25 MED ORDER — PALONOSETRON HCL INJECTION 0.25 MG/5ML
0.2500 mg | Freq: Once | INTRAVENOUS | Status: AC
  Administered 2020-01-25: 0.25 mg via INTRAVENOUS

## 2020-01-25 MED ORDER — DEXTROSE 5 % IV SOLN
Freq: Once | INTRAVENOUS | Status: AC
  Filled 2020-01-25: qty 250

## 2020-01-25 MED ORDER — PALONOSETRON HCL INJECTION 0.25 MG/5ML
INTRAVENOUS | Status: AC
  Filled 2020-01-25: qty 5

## 2020-01-25 MED ORDER — SODIUM CHLORIDE 0.9 % IV SOLN
2400.0000 mg/m2 | INTRAVENOUS | Status: DC
  Administered 2020-01-25: 4750 mg via INTRAVENOUS
  Filled 2020-01-25: qty 95

## 2020-01-25 MED ORDER — LIDOCAINE-PRILOCAINE 2.5-2.5 % EX CREA: 1.0000 "application " | TOPICAL_CREAM | CUTANEOUS | 1 refills | Status: DC | PRN

## 2020-01-25 MED ORDER — OXALIPLATIN CHEMO INJECTION 100 MG/20ML
85.0000 mg/m2 | Freq: Once | INTRAVENOUS | Status: AC
  Administered 2020-01-25: 170 mg via INTRAVENOUS
  Filled 2020-01-25: qty 34

## 2020-01-25 MED ORDER — LEUCOVORIN CALCIUM INJECTION 350 MG
400.0000 mg/m2 | Freq: Once | INTRAVENOUS | Status: AC
  Administered 2020-01-25: 792 mg via INTRAVENOUS
  Filled 2020-01-25: qty 25

## 2020-01-25 NOTE — Patient Instructions (Signed)
[facility] Discharge Instructions for Patients Receiving Chemotherapy  Today you received the following chemotherapy agents: oxaliplatin/leucovorin/fluorouracil.  To help prevent nausea and vomiting after your treatment, we encourage you to take your nausea medication as directed.   If you develop nausea and vomiting that is not controlled by your nausea medication, call the clinic.   BELOW ARE SYMPTOMS THAT SHOULD BE REPORTED IMMEDIATELY:  *FEVER GREATER THAN 100.5 F  *CHILLS WITH OR WITHOUT FEVER  NAUSEA AND VOMITING THAT IS NOT CONTROLLED WITH YOUR NAUSEA MEDICATION  *UNUSUAL SHORTNESS OF BREATH  *UNUSUAL BRUISING OR BLEEDING  TENDERNESS IN MOUTH AND THROAT WITH OR WITHOUT PRESENCE OF ULCERS  *URINARY PROBLEMS  *BOWEL PROBLEMS  UNUSUAL RASH Items with * indicate a potential emergency and should be followed up as soon as possible.  Feel free to call the clinic should you have any questions or concerns. The clinic phone number is [phone].  Please show the [facility] at check-in to the Emergency Department and triage nurse.   Oxaliplatin Injection What is this medicine? OXALIPLATIN (ox AL i PLA tin) is a chemotherapy drug. It targets fast dividing cells, like cancer cells, and causes these cells to die. This medicine is used to treat cancers of the colon and rectum, and many other cancers. This medicine may be used for other purposes; ask your health care provider or pharmacist if you have questions. COMMON BRAND NAME(S): Eloxatin What should I tell my health care provider before I take this medicine? They need to know if you have any of these conditions:  heart disease  history of irregular heartbeat  liver disease  low blood counts, like white cells, platelets, or red blood cells  lung or breathing disease, like asthma  take medicines that treat or prevent blood clots  tingling of the fingers or toes, or other nerve disorder  an  unusual or allergic reaction to oxaliplatin, other chemotherapy, other medicines, foods, dyes, or preservatives  pregnant or trying to get pregnant  breast-feeding How should I use this medicine? This drug is given as an infusion into a vein. It is administered in a hospital or clinic by a specially trained health care professional. Talk to your pediatrician regarding the use of this medicine in children. Special care may be needed. Overdosage: If you think you have taken too much of this medicine contact a poison control center or emergency room at once. NOTE: This medicine is only for you. Do not share this medicine with others. What if I miss a dose? It is important not to miss a dose. Call your doctor or health care professional if you are unable to keep an appointment. What may interact with this medicine? Do not take this medicine with any of the following medications:  cisapride  dronedarone  pimozide  thioridazine This medicine may also interact with the following medications:  aspirin and aspirin-like medicines  certain medicines that treat or prevent blood clots like warfarin, apixaban, dabigatran, and rivaroxaban  cisplatin  cyclosporine  diuretics  medicines for infection like acyclovir, adefovir, amphotericin B, bacitracin, cidofovir, foscarnet, ganciclovir, gentamicin, pentamidine, vancomycin  NSAIDs, medicines for pain and inflammation, like ibuprofen or naproxen  other medicines that prolong the QT interval (an abnormal heart rhythm)  pamidronate  zoledronic acid This list may not describe all possible interactions. Give your health care provider a list of all the medicines, herbs, non-prescription drugs, or dietary supplements you use. Also tell them if you smoke, drink alcohol, or  use illegal drugs. Some items may interact with your medicine. What should I watch for while using this medicine? Your condition will be monitored carefully while you are  receiving this medicine. You may need blood work done while you are taking this medicine. This medicine may make you feel generally unwell. This is not uncommon as chemotherapy can affect healthy cells as well as cancer cells. Report any side effects. Continue your course of treatment even though you feel ill unless your healthcare professional tells you to stop. This medicine can make you more sensitive to cold. Do not drink cold drinks or use ice. Cover exposed skin before coming in contact with cold temperatures or cold objects. When out in cold weather wear warm clothing and cover your mouth and nose to warm the air that goes into your lungs. Tell your doctor if you get sensitive to the cold. Do not become pregnant while taking this medicine or for 9 months after stopping it. Women should inform their health care professional if they wish to become pregnant or think they might be pregnant. Men should not father a child while taking this medicine and for 6 months after stopping it. There is potential for serious side effects to an unborn child. Talk to your health care professional for more information. Do not breast-feed a child while taking this medicine or for 3 months after stopping it. This medicine has caused ovarian failure in some women. This medicine may make it more difficult to get pregnant. Talk to your health care professional if you are concerned about your fertility. This medicine has caused decreased sperm counts in some men. This may make it more difficult to father a child. Talk to your health care professional if you are concerned about your fertility. This medicine may increase your risk of getting an infection. Call your health care professional for advice if you get a fever, chills, or sore throat, or other symptoms of a cold or flu. Do not treat yourself. Try to avoid being around people who are sick. Avoid taking medicines that contain aspirin, acetaminophen, ibuprofen, naproxen,  or ketoprofen unless instructed by your health care professional. These medicines may hide a fever. Be careful brushing or flossing your teeth or using a toothpick because you may get an infection or bleed more easily. If you have any dental work done, tell your dentist you are receiving this medicine. What side effects may I notice from receiving this medicine? Side effects that you should report to your doctor or health care professional as soon as possible:  allergic reactions like skin rash, itching or hives, swelling of the face, lips, or tongue  breathing problems  cough  low blood counts - this medicine may decrease the number of white blood cells, red blood cells, and platelets. You may be at increased risk for infections and bleeding  nausea, vomiting  pain, redness, or irritation at site where injected  pain, tingling, numbness in the hands or feet  signs and symptoms of bleeding such as bloody or black, tarry stools; red or dark brown urine; spitting up blood or brown material that looks like coffee grounds; red spots on the skin; unusual bruising or bleeding from the eyes, gums, or nose  signs and symptoms of a dangerous change in heartbeat or heart rhythm like chest pain; dizziness; fast, irregular heartbeat; palpitations; feeling faint or lightheaded; falls  signs and symptoms of infection like fever; chills; cough; sore throat; pain or trouble passing urine  signs  and symptoms of liver injury like dark yellow or brown urine; general ill feeling or flu-like symptoms; light-colored stools; loss of appetite; nausea; right upper belly pain; unusually weak or tired; yellowing of the eyes or skin  signs and symptoms of low red blood cells or anemia such as unusually weak or tired; feeling faint or lightheaded; falls  signs and symptoms of muscle injury like dark urine; trouble passing urine or change in the amount of urine; unusually weak or tired; muscle pain; back pain Side  effects that usually do not require medical attention (report to your doctor or health care professional if they continue or are bothersome):  changes in taste  diarrhea  gas  hair loss  loss of appetite  mouth sores This list may not describe all possible side effects. Call your doctor for medical advice about side effects. You may report side effects to FDA at 1-800-FDA-1088. Where should I keep my medicine? This drug is given in a hospital or clinic and will not be stored at home. NOTE: This sheet is a summary. It may not cover all possible information. If you have questions about this medicine, talk to your doctor, pharmacist, or health care provider.  2020 Elsevier/Gold Standard (2018-07-13 12:20:35)  Leucovorin injection What is this medicine? LEUCOVORIN (loo koe VOR in) is used to prevent or treat the harmful effects of some medicines. This medicine is used to treat anemia caused by a low amount of folic acid in the body. It is also used with 5-fluorouracil (5-FU) to treat colon cancer. This medicine may be used for other purposes; ask your health care provider or pharmacist if you have questions. What should I tell my health care provider before I take this medicine? They need to know if you have any of these conditions:  anemia from low levels of vitamin B-12 in the blood  an unusual or allergic reaction to leucovorin, folic acid, other medicines, foods, dyes, or preservatives  pregnant or trying to get pregnant  breast-feeding How should I use this medicine? This medicine is for injection into a muscle or into a vein. It is given by a health care professional in a hospital or clinic setting. Talk to your pediatrician regarding the use of this medicine in children. Special care may be needed. Overdosage: If you think you have taken too much of this medicine contact a poison control center or emergency room at once. NOTE: This medicine is only for you. Do not share this  medicine with others. What if I miss a dose? This does not apply. What may interact with this medicine?  capecitabine  fluorouracil  phenobarbital  phenytoin  primidone  trimethoprim-sulfamethoxazole This list may not describe all possible interactions. Give your health care provider a list of all the medicines, herbs, non-prescription drugs, or dietary supplements you use. Also tell them if you smoke, drink alcohol, or use illegal drugs. Some items may interact with your medicine. What should I watch for while using this medicine? Your condition will be monitored carefully while you are receiving this medicine. This medicine may increase the side effects of 5-fluorouracil, 5-FU. Tell your doctor or health care professional if you have diarrhea or mouth sores that do not get better or that get worse. What side effects may I notice from receiving this medicine? Side effects that you should report to your doctor or health care professional as soon as possible:  allergic reactions like skin rash, itching or hives, swelling of the face, lips,  or tongue  breathing problems  fever, infection  mouth sores  unusual bleeding or bruising  unusually weak or tired Side effects that usually do not require medical attention (report to your doctor or health care professional if they continue or are bothersome):  constipation or diarrhea  loss of appetite  nausea, vomiting This list may not describe all possible side effects. Call your doctor for medical advice about side effects. You may report side effects to FDA at 1-800-FDA-1088. Where should I keep my medicine? This drug is given in a hospital or clinic and will not be stored at home. NOTE: This sheet is a summary. It may not cover all possible information. If you have questions about this medicine, talk to your doctor, pharmacist, or health care provider.  2020 Elsevier/Gold Standard (2007-08-30 16:50:29)  Fluorouracil, 5-FU  injection What is this medicine? FLUOROURACIL, 5-FU (flure oh YOOR a sil) is a chemotherapy drug. It slows the growth of cancer cells. This medicine is used to treat many types of cancer like breast cancer, colon or rectal cancer, pancreatic cancer, and stomach cancer. This medicine may be used for other purposes; ask your health care provider or pharmacist if you have questions. COMMON BRAND NAME(S): Adrucil What should I tell my health care provider before I take this medicine? They need to know if you have any of these conditions:  blood disorders  dihydropyrimidine dehydrogenase (DPD) deficiency  infection (especially a virus infection such as chickenpox, cold sores, or herpes)  kidney disease  liver disease  malnourished, poor nutrition  recent or ongoing radiation therapy  an unusual or allergic reaction to fluorouracil, other chemotherapy, other medicines, foods, dyes, or preservatives  pregnant or trying to get pregnant  breast-feeding How should I use this medicine? This drug is given as an infusion or injection into a vein. It is administered in a hospital or clinic by a specially trained health care professional. Talk to your pediatrician regarding the use of this medicine in children. Special care may be needed. Overdosage: If you think you have taken too much of this medicine contact a poison control center or emergency room at once. NOTE: This medicine is only for you. Do not share this medicine with others. What if I miss a dose? It is important not to miss your dose. Call your doctor or health care professional if you are unable to keep an appointment. What may interact with this medicine?  allopurinol  cimetidine  dapsone  digoxin  hydroxyurea  leucovorin  levamisole  medicines for seizures like ethotoin, fosphenytoin, phenytoin  medicines to increase blood counts like filgrastim, pegfilgrastim, sargramostim  medicines that treat or prevent blood  clots like warfarin, enoxaparin, and dalteparin  methotrexate  metronidazole  pyrimethamine  some other chemotherapy drugs like busulfan, cisplatin, estramustine, vinblastine  trimethoprim  trimetrexate  vaccines Talk to your doctor or health care professional before taking any of these medicines:  acetaminophen  aspirin  ibuprofen  ketoprofen  naproxen This list may not describe all possible interactions. Give your health care provider a list of all the medicines, herbs, non-prescription drugs, or dietary supplements you use. Also tell them if you smoke, drink alcohol, or use illegal drugs. Some items may interact with your medicine. What should I watch for while using this medicine? Visit your doctor for checks on your progress. This drug may make you feel generally unwell. This is not uncommon, as chemotherapy can affect healthy cells as well as cancer cells. Report any side effects.  Continue your course of treatment even though you feel ill unless your doctor tells you to stop. In some cases, you may be given additional medicines to help with side effects. Follow all directions for their use. Call your doctor or health care professional for advice if you get a fever, chills or sore throat, or other symptoms of a cold or flu. Do not treat yourself. This drug decreases your body's ability to fight infections. Try to avoid being around people who are sick. This medicine may increase your risk to bruise or bleed. Call your doctor or health care professional if you notice any unusual bleeding. Be careful brushing and flossing your teeth or using a toothpick because you may get an infection or bleed more easily. If you have any dental work done, tell your dentist you are receiving this medicine. Avoid taking products that contain aspirin, acetaminophen, ibuprofen, naproxen, or ketoprofen unless instructed by your doctor. These medicines may hide a fever. Do not become pregnant while  taking this medicine. Women should inform their doctor if they wish to become pregnant or think they might be pregnant. There is a potential for serious side effects to an unborn child. Talk to your health care professional or pharmacist for more information. Do not breast-feed an infant while taking this medicine. Men should inform their doctor if they wish to father a child. This medicine may lower sperm counts. Do not treat diarrhea with over the counter products. Contact your doctor if you have diarrhea that lasts more than 2 days or if it is severe and watery. This medicine can make you more sensitive to the sun. Keep out of the sun. If you cannot avoid being in the sun, wear protective clothing and use sunscreen. Do not use sun lamps or tanning beds/booths. What side effects may I notice from receiving this medicine? Side effects that you should report to your doctor or health care professional as soon as possible:  allergic reactions like skin rash, itching or hives, swelling of the face, lips, or tongue  low blood counts - this medicine may decrease the number of white blood cells, red blood cells and platelets. You may be at increased risk for infections and bleeding.  signs of infection - fever or chills, cough, sore throat, pain or difficulty passing urine  signs of decreased platelets or bleeding - bruising, pinpoint red spots on the skin, black, tarry stools, blood in the urine  signs of decreased red blood cells - unusually weak or tired, fainting spells, lightheadedness  breathing problems  changes in vision  chest pain  mouth sores  nausea and vomiting  pain, swelling, redness at site where injected  pain, tingling, numbness in the hands or feet  redness, swelling, or sores on hands or feet  stomach pain  unusual bleeding Side effects that usually do not require medical attention (report to your doctor or health care professional if they continue or are  bothersome):  changes in finger or toe nails  diarrhea  dry or itchy skin  hair loss  headache  loss of appetite  sensitivity of eyes to the light  stomach upset  unusually teary eyes This list may not describe all possible side effects. Call your doctor for medical advice about side effects. You may report side effects to FDA at 1-800-FDA-1088. Where should I keep my medicine? This drug is given in a hospital or clinic and will not be stored at home. NOTE: This sheet is a summary. It  may not cover all possible information. If you have questions about this medicine, talk to your doctor, pharmacist, or health care provider.  2020 Elsevier/Gold Standard (2007-06-29 13:53:16)

## 2020-01-25 NOTE — Progress Notes (Signed)
Per Dr Burr Medico, ok to increase rate of 5FU pump to finish by 2pm on Saturday, 11/20. Pharmacy to adjust rate.

## 2020-01-26 ENCOUNTER — Telehealth: Payer: Self-pay | Admitting: *Deleted

## 2020-01-26 LAB — SURGICAL PATHOLOGY

## 2020-01-26 NOTE — Telephone Encounter (Signed)
-----   Message from Bo Mcclintock, RN sent at 01/25/2020  3:41 PM EST ----- Regarding: 1st chemo follow-up Dr Burr Medico 1st chemo follow-up. Received FOLFOX.

## 2020-01-26 NOTE — Telephone Encounter (Signed)
Message left for pt at home & mobile # to call us back to let us know how she is doing after her treatment yesterday.

## 2020-01-27 ENCOUNTER — Inpatient Hospital Stay: Payer: Medicare Other

## 2020-01-27 ENCOUNTER — Other Ambulatory Visit: Payer: Self-pay

## 2020-01-27 VITALS — BP 156/89 | HR 72 | Temp 97.0°F | Resp 18

## 2020-01-27 DIAGNOSIS — Z5111 Encounter for antineoplastic chemotherapy: Secondary | ICD-10-CM | POA: Diagnosis not present

## 2020-01-27 DIAGNOSIS — C221 Intrahepatic bile duct carcinoma: Secondary | ICD-10-CM

## 2020-01-27 MED ORDER — SODIUM CHLORIDE 0.9% FLUSH
10.0000 mL | INTRAVENOUS | Status: DC | PRN
  Administered 2020-01-27: 10 mL
  Filled 2020-01-27: qty 10

## 2020-01-27 MED ORDER — HEPARIN SOD (PORK) LOCK FLUSH 100 UNIT/ML IV SOLN
500.0000 [IU] | Freq: Once | INTRAVENOUS | Status: AC | PRN
  Administered 2020-01-27: 500 [IU]
  Filled 2020-01-27: qty 5

## 2020-01-27 NOTE — Patient Instructions (Signed)

## 2020-01-29 ENCOUNTER — Telehealth: Payer: Self-pay | Admitting: Nurse Practitioner

## 2020-01-29 ENCOUNTER — Other Ambulatory Visit: Payer: Self-pay | Admitting: Hematology

## 2020-01-29 DIAGNOSIS — C221 Intrahepatic bile duct carcinoma: Secondary | ICD-10-CM

## 2020-01-29 DIAGNOSIS — Z7189 Other specified counseling: Secondary | ICD-10-CM | POA: Insufficient documentation

## 2020-01-29 NOTE — Telephone Encounter (Signed)
Contacted patient to verify telephone visit for pre reg °

## 2020-01-30 ENCOUNTER — Inpatient Hospital Stay (HOSPITAL_BASED_OUTPATIENT_CLINIC_OR_DEPARTMENT_OTHER): Payer: Medicare Other | Admitting: Nurse Practitioner

## 2020-01-30 ENCOUNTER — Inpatient Hospital Stay: Payer: Medicare Other

## 2020-01-30 ENCOUNTER — Encounter: Payer: Self-pay | Admitting: Nurse Practitioner

## 2020-01-30 VITALS — BP 178/86 | HR 72 | Temp 97.1°F | Resp 18 | Ht 66.0 in | Wt 182.8 lb

## 2020-01-30 DIAGNOSIS — C221 Intrahepatic bile duct carcinoma: Secondary | ICD-10-CM

## 2020-01-30 DIAGNOSIS — Z5111 Encounter for antineoplastic chemotherapy: Secondary | ICD-10-CM | POA: Diagnosis not present

## 2020-01-30 MED ORDER — CLINDAMYCIN PHOSPHATE 1 % EX GEL: Freq: Two times a day (BID) | CUTANEOUS | 2 refills | Status: DC

## 2020-01-30 NOTE — Progress Notes (Signed)
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  Past medical history of DM, HTN.  Patient receiving folfox and adding bevacizumab.   Met with patient in clinic this pm.  Patient reports that her appetite is good.  Breakfast is usually sausage and eggs or salmon patty and egg or potato and chicken.  Lunch is sandwich or hot dog. Supper is meat and vegetables.  Reports over the weekend had issues with constipation and took miralax and metamucil. Reports constipation has resolved.  BM this am.  Reports cold sensitivity when placed her hand in cold water.  Reports mild nausea that she took medicine one time otherwise no issues.  Reports that her husband does most of the cooking.     Medications: nexium, fe sulfate, zofran, compazine, miralax  Labs: reviewed  Anthropometrics:   Height: 66 inches Weight: 185 lb Noted 199 lb on 10/09/2019 BMI: 29  7% weight loss in the last 4 months   NUTRITION DIAGNOSIS: Inadequate oral intake related to cancer and cancer related treatment side effects as evidenced by 7% weight loss   INTERVENTION:  Discussed importance of good nutrition during treatment.   Encouraged good sources of protein. Encouraged being proactive with taking nausea medications.  Discussed strategies to help with cold sensitivity from oxaliplatin.      MONITORING, EVALUATION, GOAL: weight trends, intake   NEXT VISIT: to be determined with treatment  Keiona Jenison B. Zenia Resides, [place], [place] Registered Dietitian [phone] (mobile)

## 2020-01-30 NOTE — Progress Notes (Signed)
[place]   [phone] [phone]   Clinic Follow up Note   Patient Care Team: [name], MD as PCP - General (Internal Medicine) [name], RN as Oncology Nurse Navigator [name], MD as Consulting Physician (Oncology) [name], MD as Consulting Physician (Gastroenterology) 01/30/2020  CHIEF COMPLAINT: Follow-up metastatic colon cancer  SUMMARY OF ONCOLOGIC HISTORY: Oncology History Overview Note  Cancer Staging No matching staging information was found for the patient.    metastatic colon cancer to liver  06/20/2019 Procedure   Colonoscopy by Dr [name] 06/20/19  IMPRESSION -Seven 3 to 10 mm polyps in the sigmoid colon, in the transverse colon and in the escending colon, removed with a cold snare. Resected and retrireved.  -One 48m polyp in the descending colon. Biopsies. Tattoes.  -Mediaum sized lipoma in the ascending colon.   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 See Comment B. Colon, Ascending, polyp, Polypectomy:  -TUBULAR ADENOMA -No high grade dysplasia or malignancy.  C. Colon, TRansverse, Polyo, polectomy:  -TUBULAR ADENOMA -No high grade dysplasia or malignancy.  D. Colon, Sigmoid, Polyp, Polypectomy:  -HYPERPLASTIC POLYP   11/07/2019 Imaging   UKoreaAbdomen 11/07/19  IMPRESSION: 1. Two solid masses in the liver are nonspecific. Recommend MRI abdomen with and without contrast for further evaluation.   12/15/2019 Imaging   MRI Abdomen 12/15/19  IMPRESSION: 1. There are two large masses in the liver with appearance favoring metastatic disease or hepatocellular carcinoma or cholangiocarcinoma. A benign etiology is highly unlikely given the enhancement pattern and associated adenopathy. 2. Considerable porta hepatis and retroperitoneal adenopathy. Some of the confluent porta hepatis tumor is potentially infiltrative  and abuts the pancreatic body along its right upper margin, making it difficult to completely exclude the possibility of pancreatic adenocarcinoma primary. Possibilities helpful in further workup might include tissue diagnosis, endoscopic ultrasound, or nuclear medicine PET-CT. 3. Pancreas divisum. 4. Lumbar spondylosis and degenerative disc disease. 5. Despite efforts by the technologist and patient, motion artifact is present on today's exam and could not be eliminated. This reduces exam sensitivity and specificity.   12/22/2019 Procedure   Upper Endoscopy by Dr [name]10/15/21  IMPRESSION - One lymph node was visualized and measured in the porta hepatis region. Fine needle aspiration performed    12/22/2019 Initial Biopsy   A. LIVER, PORTA HEPATIS MASS, FINE NEEDLE 12/22/19 ASPIRATION:  FINAL MICROSCOPIC DIAGNOSIS:  - Malignant cells consistent with metastatic adenocarcinoma   01/01/2020 Initial Diagnosis   Intrahepatic cholangiocarcinoma ([place]   01/08/2020 Initial Biopsy   FINAL MICROSCOPIC DIAGNOSIS:   A. LIVER, LEFT LOBE, BIOPSY:  - Metastatic adenocarcinoma, consistent with a colorectal primary.  See  comment      COMMENT:   Immunohistochemical stains show the tumor cells are positive for CK20  and CDX2 but negative for CK7, consistent with above interpretation.  Dr. [name] notified on 01/10/2020   01/25/2020 -  Chemotherapy   The patient had dexamethasone (DECADRON) 4 MG tablet, 8 mg, Oral, Daily, 1 of 1 cycle, Start date: --, End date: -- palonosetron (ALOXI) injection 0.25 mg, 0.25 mg, Intravenous,  Once, 1 of 4 cycles Administration: 0.25 mg (01/25/2020) leucovorin 792 mg in dextrose 5 % 250 mL infusion, 400 mg/m2 = 792 mg, Intravenous,  Once, 1 of 4 cycles Administration: 792 mg (01/25/2020) oxaliplatin (ELOXATIN) 170 mg in dextrose 5 % 500 mL chemo infusion, 85 mg/m2 = 170 mg, Intravenous,  Once, 1  of 4 cycles Administration: 170 mg  (01/25/2020) fluorouracil (ADRUCIL) 4,750 mg in sodium chloride 0.9 % 55 mL chemo infusion, 2,400 mg/m2 = 4,750 mg, Intravenous, 1 Day/Dose, 1 of 4 cycles Administration: 4,750 mg (01/25/2020) bevacizumab-bvzr (ZIRABEV) 400 mg in sodium chloride 0.9 % 100 mL chemo infusion, 5 mg/kg, Intravenous,  Once, 0 of 2 cycles  for chemotherapy treatment.    02/06/2020 -  Chemotherapy   The patient had panitumumab (VECTIBIX) 500 mg in sodium chloride 0.9 % 100 mL chemo infusion, 6 mg/kg = 500 mg, Intravenous,  Once, 0 of 1 cycle panitumumab (VECTIBIX) 500 mg in sodium chloride 0.9 % 100 mL chemo infusion, 6 mg/kg = 500 mg, Intravenous,  Once, 0 of 3 cycles  for chemotherapy treatment.      CURRENT THERAPY: first line FOLFOX starting 01/25/2020, plan to add Panitumumab   INTERVAL HISTORY: Ms. Gates returns by herself for toxicity check.  She received cycle 1 FOLFOX on 11/18.  She tolerated well.  Able to eat and drink and carry on her normal daily activities.  She had mild nausea and took antiemetics twice which were effective, no emesis.  She was constipated on days 3 and 4, doubled her dose of Metamucil and MiraLAX ultimately "[facility]" took care of it.  Cold sensitivity still present on day 6, but completely functional.  Denies fever, mucositis, fever, chills, cough, chest pain, dyspnea.    MEDICAL HISTORY:  Past Medical History:  Diagnosis Date  . Arthritis   . Diabetes mellitus without complication ([place])   . Hypertension     SURGICAL HISTORY: Past Surgical History:  Procedure Laterality Date  . ABDOMINAL HYSTERECTOMY    . ANTERIOR AND POSTERIOR REPAIR WITH SACROSPINOUS FIXATION N/A 07/16/2016   Procedure: ANTERIOR AND POSTERIOR REPAIR WITH SACROSPINOUS FIXATION;  Surgeon: Everlene Farrier, MD;  Location: [facility];  Service: Gynecology;  Laterality: N/A;  . CYSTOSCOPY  07/16/2016   Procedure: CYSTOSCOPY;  Surgeon: Everlene Farrier, MD;  Location: [facility];  Service: Gynecology;;  .  ESOPHAGOGASTRODUODENOSCOPY (EGD) WITH PROPOFOL N/A 12/22/2019   Procedure: ESOPHAGOGASTRODUODENOSCOPY (EGD) WITH PROPOFOL;  Surgeon: [name], MD;  Location: [facility];  Service: Endoscopy;  Laterality: N/A;  . FINE NEEDLE ASPIRATION N/A 12/22/2019   Procedure: FINE NEEDLE ASPIRATION (FNA) LINEAR;  Surgeon: [name], MD;  Location: [facility];  Service: Endoscopy;  Laterality: N/A;  . IR IMAGING GUIDED PORT INSERTION  01/24/2020  . LAPAROSCOPIC VAGINAL HYSTERECTOMY WITH SALPINGECTOMY Bilateral 07/16/2016   Procedure: LAPAROSCOPIC ASSISTED VAGINAL HYSTERECTOMY WITH SALPINGECTOMY;  Surgeon: Everlene Farrier, MD;  Location: [facility];  Service: Gynecology;  Laterality: Bilateral;  . MYOMECTOMY ABDOMINAL APPROACH    . THYROID SURGERY    . tyroid    . UPPER ESOPHAGEAL ENDOSCOPIC ULTRASOUND (EUS) N/A 12/22/2019   Procedure: UPPER ESOPHAGEAL ENDOSCOPIC ULTRASOUND (EUS);  Surgeon: [name], MD;  Location: [facility];  Service: Endoscopy;  Laterality: N/A;    I have reviewed the social history and family history with the patient and they are unchanged from previous note.  ALLERGIES:  has No Known Allergies.  MEDICATIONS:  Current Outpatient Medications  Medication Sig Dispense Refill  . aspirin EC 81 MG tablet Take 1 tablet (81 mg total) by mouth daily. 90 tablet 3  . atorvastatin (LIPITOR) 20 MG tablet Take 20 mg by mouth at bedtime.   1  . clindamycin (CLINDAGEL) 1 % gel Apply topically 2 (two) times daily. 60 g 2  . clobetasol cream (TEMOVATE) 1.61 % Apply 1 application topically daily.    Marland Kitchen  esomeprazole (NEXIUM) 40 MG capsule Take 40 mg by mouth daily.    . ferrous sulfate 325 (65 FE) MG tablet Take 1 tablet by mouth daily.    Marland Kitchen lidocaine-prilocaine (EMLA) cream Apply 1 application topically as needed. 30 g 1  . lisinopril-hydrochlorothiazide (ZESTORETIC) 20-12.5 MG tablet Take 1 tablet by mouth daily with breakfast.     . ondansetron (ZOFRAN) 8 MG tablet Take 1 tablet (8 mg  total) by mouth every 8 (eight) hours as needed for nausea or vomiting. 20 tablet 1  . prochlorperazine (COMPAZINE) 10 MG tablet Take 1 tablet (10 mg total) by mouth every 6 (six) hours as needed for nausea or vomiting. 30 tablet 0  . spironolactone (ALDACTONE) 25 MG tablet Take 25 mg by mouth daily with breakfast.  (Patient not taking: Reported on 01/22/2020)    . traMADol (ULTRAM) 50 MG tablet Take 1 tablet (50 mg total) by mouth every 6 (six) hours as needed. 15 tablet 0   No current facility-administered medications for this visit.    PHYSICAL EXAMINATION: ECOG PERFORMANCE STATUS: 1 - Symptomatic but completely ambulatory  Vitals:   01/30/20 1515  BP: (!) 178/86  Pulse: 72  Resp: 18  Temp: (!) 97.1 F (36.2 C)  SpO2: 100%   Filed Weights   01/30/20 1515  Weight: 182 lb 12.8 oz (82.9 kg)    GENERAL:alert, no distress and comfortable SKIN: No rash EYES: sclera clear OROPHARYNX: No thrush or ulcers LUNGS:  normal breathing effort NEURO: alert & oriented x 3 with fluent speech, no focal motor/sensory deficits PAC healing well, no erythema  LABORATORY DATA:  I have reviewed the data as listed CBC Latest Ref Rng & Units 01/22/2020 01/05/2020 07/17/2016  WBC 4.0 - 10.5 K/uL 10.5 11.4(H) 15.6(H)  Hemoglobin 12.0 - 15.0 g/dL 13.0 13.1 12.3  Hematocrit 36 - 46 % 39.1 39.6 35.6(L)  Platelets 150 - 400 K/uL 353 330 320     CMP Latest Ref Rng & Units 01/22/2020 01/05/2020 07/09/2016  Glucose 70 - 99 mg/dL 107(H) 109(H) 107(H)  BUN 8 - 23 mg/dL _0 Creatinine 0.44 - 1.00 mg/dL 0.78 0.72 0.62  Sodium 135 - 145 mmol/L 142 141 133(L)  Potassium 3.5 - 5.1 mmol/L 3.6 3.7 3.5  Chloride 98 - 111 mmol/L 100 102 100(L)  CO2 22 - 32 mmol/L _1 Calcium 8.9 - 10.3 mg/dL 9.6 9.8 9.3  Total Protein 6.5 - 8.1 g/dL 8.3(H) 8.2(H) 8.2(H)  Total Bilirubin 0.3 - 1.2 mg/dL 0.4 0.5 0.3  Alkaline Phos 38 - 126 U/L 158(H) 150(H) 71  AST 15 - 41 U/L _2 ALT 0 - 44 U/L 22 26 36       RADIOGRAPHIC STUDIES: I have personally reviewed the radiological images as listed and agreed with the findings in the report. No results found.   ASSESSMENT & PLAN: E Flytheis a 64 y.o.femalewith   1. 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 -Her EUS from 12/22/19 showed 3.5cm LN in porta hepatis regionand biopsy confirmed metastatic adenocarcinoma.Preliminary cytology sample is not adequate for any additional IHC study for the origin of tumor, the morphology does not support HCC. -Her EGD was negative formalignancy. Her colonoscopy in March 2021 didshow a large 2 cm polyps in descending colon, biopsy showed high-grade dysplasia. -Liver biopsy confirmed adenocarcinoma, immunostain showed  positive CDX2 and CK20, negative CK7, supporting primary colorectalcancer  -PET scan showed known liver metastasis, and diffuse thoracic and abdominal adenopathy. There isa hypermetabolic 1.8 cm pulmonary nodule in left upper lobe, and focal hypermetabolic lesion in the splenic fixture of the colon,concerning for primary tumor. -She underwent repeat colonoscopy and biopsy by Dr. Benson Norway last week, I have requested the report and path.  -additional IHC testing with TTF-1 shows focal staining, which can be seen in 5-10% of colorectal primary cancers as well as lung cancer. However given the above, the overall picture is still consistent with colorectal primary.  -She began first line systemic FOLFOX on 11/18, goal is palliative. Tolerated well  -FO shows [name]/[name] wild-type, she is a candidate for EGFR inhibitor Panitumumab. She consented today. Plan to start with C2.   2. Chronic Lower abdominal pain, Nausea  -She has had chronic low abdominal pain for 5 years ranging up to 4  times a week. She notes her pain worsened in the last 3 years, but no work-up until 2021.  -Her pain is 5/10 at most but when high not managed on Motrin.  -She was encouraged to try Tylenol for mild pain and tramadol for severe pain, prescription was sent on 01/01/2020 but she has not started yet.  I encouraged her to try, watch for constipation, and use Colace and MiraLAX twice daily when taking tramadol given that she is prone to constipation -I reviewed the importance of maintaining her weight and nutrition on treatment, encouraged her to use Ensure/boost if she has decreased appetite -she met with Jennet Maduro, dietician today  3. HTN, DM, Heart Murmur, Arthritis  -Per PCP and Cardiologist Dr Einar Gip -DM controlled, no pre-existing neuropathy -We will monitor for elevated BG and neuropathy on chemo  4. 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  -She lives with husband and has brothers and sister in and out of town. Her only child passed in recent years.   5. Goals of care -we discussed the incurable but treatable nature of her stage IV cancer and general prognosis. She understands the prognosis is poor if she does not tolerate or respond well to chemo -she understands the goal is palliative  -code status not addressed today  Disposition: Ms. Brazzle appears stable.  She is s/p cycle 1 day 6 FOLFOX, tolerated well with mild cold sensitivity, nausea, and constipation.  Side effects are well managed with supportive meds at home.  She is able to recover and function well.   We reviewed her [name] report which shows [name]/[name] wild-type.  She is eligible for EGFR inhibitor Panitumumab.  Potential benefit and side effects including but not limited to fatigue, acne type skin rash, diarrhea, ocular toxicities were reviewed in detail.  Plan to add with cycle 2.  She agrees to proceed.  I will call in Clindagel.  No labs today.  She will return for follow-up  and cycle 2 FOLFOX and first Panitumumab on 11/30.  All questions were answered. The patient knows to call the clinic with any problems, questions or concerns. No barriers to learning were detected.     Alla Feeling, NP 01/30/20

## 2020-02-05 NOTE — Progress Notes (Signed)
Lindsay [name]   [phone] Fax:(336) [phone]   Clinic Follow up Note   Patient Care Team: [name], MD as PCP - General (Internal Medicine) [name], RN as Oncology Nurse Navigator [name], MD as Consulting Physician (Oncology) [name], MD as Consulting Physician (Gastroenterology)  Date of Service:  02/06/2020  CHIEF COMPLAINT: Follow-up metastatic colon cancer  SUMMARY OF ONCOLOGIC HISTORY: Oncology History Overview Note  Cancer Staging No matching staging information was found for the patient.    metastatic colon cancer to liver  06/20/2019 Procedure   Colonoscopy by Dr [name] 06/20/19  IMPRESSION -Seven 3 to 10 mm polyps in the sigmoid colon, in the transverse colon and in the escending colon, removed with a cold snare. Resected and retrireved.  -One 26mm polyp in the descending colon. Biopsies. Tattoes.  -Mediaum sized lipoma in the ascending colon.   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 See Comment B. Colon, Ascending, polyp, Polypectomy:  -TUBULAR ADENOMA -No high grade dysplasia or malignancy.  C. Colon, TRansverse, Polyo, polectomy:  -TUBULAR ADENOMA -No high grade dysplasia or malignancy.  D. Colon, Sigmoid, Polyp, Polypectomy:  -HYPERPLASTIC POLYP   11/07/2019 Imaging   US Abdomen 11/07/19  IMPRESSION: 1. Two solid masses in the liver are nonspecific. Recommend MRI abdomen with and without contrast for further evaluation.   12/15/2019 Imaging   MRI Abdomen 12/15/19  IMPRESSION: 1. There are two large masses in the liver with appearance favoring metastatic disease or hepatocellular carcinoma or cholangiocarcinoma. A benign etiology is highly unlikely given the enhancement pattern and associated adenopathy. 2. Considerable porta hepatis and retroperitoneal adenopathy. Some of the confluent porta hepatis tumor is  potentially infiltrative and abuts the pancreatic body along its right upper margin, making it difficult to completely exclude the possibility of pancreatic adenocarcinoma primary. Possibilities helpful in further workup might include tissue diagnosis, endoscopic ultrasound, or nuclear medicine PET-CT. 3. Pancreas divisum. 4. Lumbar spondylosis and degenerative disc disease. 5. Despite efforts by the technologist and patient, motion artifact is present on today's exam and could not be eliminated. This reduces exam sensitivity and specificity.   12/22/2019 Procedure   Upper Endoscopy by Dr Benson Norway 12/22/19  IMPRESSION - One lymph node was visualized and measured in the porta hepatis region. Fine needle aspiration performed    12/22/2019 Initial Biopsy   A. LIVER, PORTA HEPATIS MASS, FINE NEEDLE 12/22/19 ASPIRATION:  FINAL MICROSCOPIC DIAGNOSIS:  - Malignant cells consistent with metastatic adenocarcinoma   01/01/2020 Initial Diagnosis   Intrahepatic cholangiocarcinoma ([place])   01/08/2020 Initial Biopsy   FINAL MICROSCOPIC DIAGNOSIS:   A. LIVER, LEFT LOBE, BIOPSY:  - Metastatic adenocarcinoma, consistent with a colorectal primary.  See  comment      COMMENT:   Immunohistochemical stains show the tumor cells are positive for CK20  and CDX2 but negative for CK7, consistent with above interpretation.  Dr. Burr Medico was notified on 01/10/2020   01/08/2020 Genetic Testing   [name]  [name] wildtype and [name]/[name] mutations which make her eligible for target biological agent Vectibix.   01/24/2020 Procedure   PAC placed 01/24/20   01/25/2020 -  Chemotherapy   first line FOLFOX starting 01/25/2020, plan to add Panitumumab with C2 or C3      CURRENT THERAPY:  first line FOLFOX starting 01/25/2020, plan to add Vextibix  with C2 or C3  INTERVAL HISTORY:  Lindsay [name] is here for a follow up. She  presents to the clinic alone. She notes she tolerated first cycle well. She was  able to recover well. She did eat well and abel to gain weight. She still has cold sensitivity (mostly in hands) after first week. She denies neuropathy.  She usually get relaxers in her hair. She wants to continue giving the thinning of her hair is making it harder to manage. She will ask her stylist what chemicals are used.  She has had the flu shot and COVID vaccine and plans to get the PNA shot.     REVIEW OF SYSTEMS:   Constitutional: Denies fevers, chills or abnormal weight loss Eyes: Denies blurriness of vision Ears, nose, mouth, throat, and face: Denies mucositis or sore throat Respiratory: Denies cough, dyspnea or wheezes Cardiovascular: Denies palpitation, chest discomfort or lower extremity swelling Gastrointestinal:  Denies nausea, heartburn or change in bowel habits Skin: Denies abnormal skin rashes Lymphatics: Denies new lymphadenopathy or easy bruising Neurological:Denies numbness, tingling or new weaknesses (+) cold sensitivity  Behavioral/Psych: Mood is stable, no new changes  All other systems were reviewed with the patient and are negative.  MEDICAL HISTORY:  Past Medical History:  Diagnosis Date  . Arthritis   . Diabetes mellitus without complication ([place])   . Hypertension     SURGICAL HISTORY: Past Surgical History:  Procedure Laterality Date  . ABDOMINAL HYSTERECTOMY    . ANTERIOR AND POSTERIOR REPAIR WITH SACROSPINOUS FIXATION N/A 07/16/2016   Procedure: ANTERIOR AND POSTERIOR REPAIR WITH SACROSPINOUS FIXATION;  Surgeon: Everlene Farrier, MD;  Location: [facility];  Service: Gynecology;  Laterality: N/A;  . CYSTOSCOPY  07/16/2016   Procedure: CYSTOSCOPY;  Surgeon: Everlene Farrier, MD;  Location: [facility];  Service: Gynecology;;  . ESOPHAGOGASTRODUODENOSCOPY (EGD) WITH PROPOFOL N/A 12/22/2019   Procedure: ESOPHAGOGASTRODUODENOSCOPY (EGD) WITH PROPOFOL;  Surgeon: [name], MD;  Location: [facility];  Service: Endoscopy;  Laterality: N/A;  . FINE NEEDLE ASPIRATION  N/A 12/22/2019   Procedure: FINE NEEDLE ASPIRATION (FNA) LINEAR;  Surgeon: [name], MD;  Location: [facility];  Service: Endoscopy;  Laterality: N/A;  . IR IMAGING GUIDED PORT INSERTION  01/24/2020  . LAPAROSCOPIC VAGINAL HYSTERECTOMY WITH SALPINGECTOMY Bilateral 07/16/2016   Procedure: LAPAROSCOPIC ASSISTED VAGINAL HYSTERECTOMY WITH SALPINGECTOMY;  Surgeon: Everlene Farrier, MD;  Location: [facility];  Service: Gynecology;  Laterality: Bilateral;  . MYOMECTOMY ABDOMINAL APPROACH    . THYROID SURGERY    . tyroid    . UPPER ESOPHAGEAL ENDOSCOPIC ULTRASOUND (EUS) N/A 12/22/2019   Procedure: UPPER ESOPHAGEAL ENDOSCOPIC ULTRASOUND (EUS);  Surgeon: [name], MD;  Location: [facility];  Service: Endoscopy;  Laterality: N/A;    I have reviewed the social history and family history with the patient and they are unchanged from previous note.  ALLERGIES:  has No Known Allergies.  MEDICATIONS:  Current Outpatient Medications  Medication Sig Dispense Refill  . aspirin EC 81 MG tablet Take 1 tablet (81 mg total) by mouth daily. 90 tablet 3  . atorvastatin (LIPITOR) 20 MG tablet Take 20 mg by mouth at bedtime.   1  . clindamycin (CLINDAGEL) 1 % gel Apply topically 2 (two) times daily. 60 g 2  . clobetasol cream (TEMOVATE) 4.82 % Apply 1 application topically daily.    Marland Kitchen esomeprazole (NEXIUM) 40 MG capsule Take 40 mg by mouth daily.    . ferrous sulfate 325 (65 FE) MG tablet Take 1 tablet by mouth daily.    Marland Kitchen lidocaine-prilocaine (EMLA) cream Apply 1 application topically as needed. 30 g 1  . lisinopril-hydrochlorothiazide (  ZESTORETIC) 20-12.5 MG tablet Take 1 tablet by mouth daily with breakfast.     . prochlorperazine (COMPAZINE) 10 MG tablet Take 1 tablet (10 mg total) by mouth every 6 (six) hours as needed for nausea or vomiting. 30 tablet 0  . traMADol (ULTRAM) 50 MG tablet Take 1 tablet (50 mg total) by mouth every 6 (six) hours as needed. 15 tablet 0  . ondansetron (ZOFRAN) 8 MG tablet  Take 1 tablet (8 mg total) by mouth every 8 (eight) hours as needed for nausea or vomiting. (Patient not taking: Reported on 02/06/2020) 20 tablet 1  . spironolactone (ALDACTONE) 25 MG tablet Take 25 mg by mouth daily with breakfast.  (Patient not taking: Reported on 01/22/2020)     No current facility-administered medications for this visit.   Facility-Administered Medications Ordered in Other Visits  Medication Dose Route Frequency Provider Last Rate Last Admin  . dextrose 5 % solution   Intravenous Once [name], MD      . fluorouracil (ADRUCIL) 4,750 mg in sodium chloride 0.9 % 55 mL chemo infusion  2,400 mg/m2 (Treatment Plan Recorded) Intravenous 1 day or 1 dose [name], MD      . leucovorin 792 mg in dextrose 5 % 250 mL infusion  400 mg/m2 (Treatment Plan Recorded) Intravenous Once [name], MD      . oxaliplatin (ELOXATIN) 170 mg in dextrose 5 % 500 mL chemo infusion  85 mg/m2 (Treatment Plan Recorded) Intravenous Once [name], MD      . panitumumab (VECTIBIX) 500 mg in sodium chloride 0.9 % 100 mL chemo infusion  6 mg/kg (Treatment Plan Recorded) Intravenous Once [name], MD 125 mL/hr at 02/06/20 1427 500 mg at 02/06/20 1427    PHYSICAL EXAMINATION: ECOG PERFORMANCE STATUS: 1 - Symptomatic but completely ambulatory  Vitals:   02/06/20 1244  BP: (!) 161/84  Pulse: 70  Resp: 18  Temp: 97.8 F (36.6 C)  SpO2: 100%   Filed Weights   02/06/20 1244  Weight: 184 lb 11.2 oz (83.8 kg)    Due to COVID19 we will limit examination to appearance. Patient had no complaints.  GENERAL:alert, no distress and comfortable SKIN: skin color normal, no rashes or significant lesions EYES: normal, Conjunctiva are pink and non-injected, sclera clear  NEURO: alert & oriented x 3 with fluent speech   LABORATORY DATA:  I have reviewed the data as listed CBC Latest Ref Rng & Units 02/06/2020 01/22/2020 01/05/2020  WBC 4.0 - 10.5 K/uL 6.8 10.5 11.4(H)  Hemoglobin 12.0 - 15.0 g/dL 12.2  13.0 13.1  Hematocrit 36 - 46 % 37.2 39.1 39.6  Platelets 150 - 400 K/uL 331 353 330     CMP Latest Ref Rng & Units 02/06/2020 01/22/2020 01/05/2020  Glucose 70 - 99 mg/dL 111(H) 107(H) 109(H)  BUN 8 - 23 mg/dL $Remove'11 9 11  'RyVJFZF$ Creatinine 0.44 - 1.00 mg/dL 0.77 0.78 0.72  Sodium 135 - 145 mmol/L 142 142 141  Potassium 3.5 - 5.1 mmol/L 3.4(L) 3.6 3.7  Chloride 98 - 111 mmol/L 100 100 102  CO2 22 - 32 mmol/L $RemoveB'30 31 28  'DPGGSRXO$ Calcium 8.9 - 10.3 mg/dL 9.6 9.6 9.8  Total Protein 6.5 - 8.1 g/dL 7.5 8.3(H) 8.2(H)  Total Bilirubin 0.3 - 1.2 mg/dL 0.4 0.4 0.5  Alkaline Phos 38 - 126 U/L 140(H) 158(H) 150(H)  AST 15 - 41 U/L $Remo'17 22 27  'fqhwe$ ALT 0 - 44 U/L $Remo'19 22 26      'jdpaA$ RADIOGRAPHIC STUDIES: I  have personally reviewed the radiological images as listed and agreed with the findings in the report. No results found.   ASSESSMENT & PLAN:  Lindsay [name] is a 64 y.o. female with    1. 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 -Her EUS from 12/22/19 showed 3.5cm LN in porta hepatis regionand biopsy confirmed metastatic adenocarcinoma.Her liver biopsy results confirmed adenocarcinoma, immunostain showed positive CDX2 and CK20, negative CK7, supporting primary colorectal cancer.  -Initial PET from 01/12/20 showed It showed known liver metastasis, and diffuse adenopathy in thoracic and abdomen. There is a hypermetabolic 1.8 cm pulmonary nodule in left upper lobe, and focal hypermetabolic lesion in the splenic fixture of the colon, concerning for primary tumor. -Sheunderwent repeat colonoscopy and biopsy by Dr. Benson Norway in 01/2020. The overall picture is still consistent with colorectal primary.  -Although her cancer is not curable at this stage, it is still treatable. I started her on first-line FOLFOX q2weeks on 01/25/20. Goal is to control her disease  and prolong her life. Plan to scan her after C5.  -Her [name] results showed [name]/[name]/BRAF wildtype which make her eligible for EGFR inhibitor such as Vectibix. Plan to add starting with C2 today. I reviewed Vectibix side effects, especially skin rash, and diarrhea, and management. She is interested and agreed to proceed.  -She tolerated first cycle well without major side effects. She had manageable cold sensitivity, no neuropathy. She was able to gain weight.  -Labs reviewed and adequate to proceed with C2 FOLFOX today.  -F/u in 2 weeks    2. Chronic Lower abdominal pain, Nausea  -She has had chronic low abdominal pain for 5 years ranging up to 4 times a week. She notes her pain worsened in the last 3 years, but no work-up until 2021.  -For pain she is now on Tramadol for severe pain. She can otherwise she Tylenol -For her intermittent nausea she has Zofran -I discussed to maintain weight she can continue Ensure with high protein/high calorie diet and remain active  -Continue to f/u with dietician.    3. HTN, DM, Heart Murmur, Arthritis  -On medications. Managed by her PCP and Cardiologist Dr Einar Gip   4. 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  -She lives with husband and has brothers and sister in and out of town. Her only child passed in recent years.  -She notes her husband is aware of her condition and treatment, but she keeps the information to a minimum as she feels he may not handle it well.    PLAN: -Labs reviewed and adequate to proceed with C2 FOLFOX and will add on Vectibix today  -Pt will request ingredient list of the relaxer she uses -Lab, flush, f/u and FOLFOX and Vectibix in 2, 4, 6, 8 weeks   No problem-specific Assessment & Plan notes found for this encounter.   No orders of the defined types were placed in this encounter.  All questions were answered. The patient knows to call the clinic with any problems,  questions or concerns. No barriers to learning was detected. The total time spent in the appointment was 30 minutes.     [name], MD 02/06/2020   I, Joslyn Devon, am acting as scribe for [name], MD.   I have reviewed the above documentation for accuracy and completeness, and I agree  with the above.

## 2020-02-06 ENCOUNTER — Encounter: Payer: Self-pay | Admitting: Hematology

## 2020-02-06 ENCOUNTER — Inpatient Hospital Stay: Payer: Medicare Other

## 2020-02-06 ENCOUNTER — Other Ambulatory Visit: Payer: Self-pay

## 2020-02-06 ENCOUNTER — Inpatient Hospital Stay: Payer: Medicare Other | Admitting: Hematology

## 2020-02-06 VITALS — BP 161/84 | HR 70 | Temp 97.8°F | Resp 18 | Ht 66.0 in | Wt 184.7 lb

## 2020-02-06 DIAGNOSIS — C221 Intrahepatic bile duct carcinoma: Secondary | ICD-10-CM

## 2020-02-06 DIAGNOSIS — Z95828 Presence of other vascular implants and grafts: Secondary | ICD-10-CM

## 2020-02-06 DIAGNOSIS — Z7189 Other specified counseling: Secondary | ICD-10-CM

## 2020-02-06 DIAGNOSIS — Z5111 Encounter for antineoplastic chemotherapy: Secondary | ICD-10-CM | POA: Diagnosis not present

## 2020-02-06 LAB — CBC WITH DIFFERENTIAL (CANCER CENTER ONLY)
Abs Immature Granulocytes: 0.03 10*3/uL (ref 0.00–0.07)
Basophils Absolute: 0 10*3/uL (ref 0.0–0.1)
Basophils Relative: 0 %
Eosinophils Absolute: 0.1 10*3/uL (ref 0.0–0.5)
Eosinophils Relative: 1 %
HCT: 37.2 % (ref 36.0–46.0)
Hemoglobin: 12.2 g/dL (ref 12.0–15.0)
Immature Granulocytes: 0 %
Lymphocytes Relative: 15 %
Lymphs Abs: 1 10*3/uL (ref 0.7–4.0)
MCH: 29.3 pg (ref 26.0–34.0)
MCHC: 32.8 g/dL (ref 30.0–36.0)
MCV: 89.2 fL (ref 80.0–100.0)
Monocytes Absolute: 0.7 10*3/uL (ref 0.1–1.0)
Monocytes Relative: 10 %
Neutro Abs: 5 10*3/uL (ref 1.7–7.7)
Neutrophils Relative %: 74 %
Platelet Count: 331 10*3/uL (ref 150–400)
RBC: 4.17 MIL/uL (ref 3.87–5.11)
RDW: 13.5 % (ref 11.5–15.5)
WBC Count: 6.8 10*3/uL (ref 4.0–10.5)
nRBC: 0 % (ref 0.0–0.2)

## 2020-02-06 LAB — CMP (CANCER CENTER ONLY)
ALT: 19 U/L (ref 0–44)
AST: 17 U/L (ref 15–41)
Albumin: 3.7 g/dL (ref 3.5–5.0)
Alkaline Phosphatase: 140 U/L — ABNORMAL HIGH (ref 38–126)
Anion gap: 12 (ref 5–15)
BUN: 11 mg/dL (ref 8–23)
CO2: 30 mmol/L (ref 22–32)
Calcium: 9.6 mg/dL (ref 8.9–10.3)
Chloride: 100 mmol/L (ref 98–111)
Creatinine: 0.77 mg/dL (ref 0.44–1.00)
GFR, Estimated: >60 mL/min (ref >60)
Glucose, Bld: 111 mg/dL — ABNORMAL HIGH (ref 70–99)
Potassium: 3.4 mmol/L — ABNORMAL LOW (ref 3.5–5.1)
Sodium: 142 mmol/L (ref 135–145)
Total Bilirubin: 0.4 mg/dL (ref 0.3–1.2)
Total Protein: 7.5 g/dL (ref 6.5–8.1)

## 2020-02-06 LAB — MAGNESIUM: Magnesium: 1.8 mg/dL (ref 1.7–2.4)

## 2020-02-06 LAB — CEA (IN HOUSE-CHCC): CEA (CHCC-In House): 182.41 ng/mL — ABNORMAL HIGH (ref 0.00–5.00)

## 2020-02-06 MED ORDER — SODIUM CHLORIDE 0.9 % IV SOLN
Freq: Once | INTRAVENOUS | Status: AC
  Filled 2020-02-06: qty 250

## 2020-02-06 MED ORDER — SODIUM CHLORIDE 0.9 % IV SOLN
10.0000 mg | Freq: Once | INTRAVENOUS | Status: AC
  Administered 2020-02-06: 10 mg via INTRAVENOUS
  Filled 2020-02-06: qty 10

## 2020-02-06 MED ORDER — PALONOSETRON HCL INJECTION 0.25 MG/5ML
0.2500 mg | Freq: Once | INTRAVENOUS | Status: AC
  Administered 2020-02-06: 0.25 mg via INTRAVENOUS

## 2020-02-06 MED ORDER — SODIUM CHLORIDE 0.9 % IV SOLN
6.0000 mg/kg | Freq: Once | INTRAVENOUS | Status: AC
  Administered 2020-02-06: 500 mg via INTRAVENOUS
  Filled 2020-02-06: qty 20

## 2020-02-06 MED ORDER — DEXTROSE 5 % IV SOLN
Freq: Once | INTRAVENOUS | Status: AC
  Filled 2020-02-06: qty 250

## 2020-02-06 MED ORDER — LEUCOVORIN CALCIUM INJECTION 350 MG
400.0000 mg/m2 | Freq: Once | INTRAVENOUS | Status: AC
  Administered 2020-02-06: 792 mg via INTRAVENOUS
  Filled 2020-02-06: qty 39.6

## 2020-02-06 MED ORDER — PALONOSETRON HCL INJECTION 0.25 MG/5ML
INTRAVENOUS | Status: AC
  Filled 2020-02-06: qty 5

## 2020-02-06 MED ORDER — SODIUM CHLORIDE 0.9 % IV SOLN
2400.0000 mg/m2 | INTRAVENOUS | Status: DC
  Administered 2020-02-06: 4750 mg via INTRAVENOUS
  Filled 2020-02-06: qty 95

## 2020-02-06 MED ORDER — SODIUM CHLORIDE 0.9% FLUSH
10.0000 mL | Freq: Once | INTRAVENOUS | Status: AC
  Administered 2020-02-06: 10 mL via INTRAVENOUS
  Filled 2020-02-06: qty 10

## 2020-02-06 MED ORDER — OXALIPLATIN CHEMO INJECTION 100 MG/20ML
85.0000 mg/m2 | Freq: Once | INTRAVENOUS | Status: AC
  Administered 2020-02-06: 170 mg via INTRAVENOUS
  Filled 2020-02-06: qty 34

## 2020-02-06 NOTE — Patient Instructions (Signed)
Lindsay [name] Discharge Instructions for Patients Receiving Chemotherapy  Today you received the following chemotherapy agents: oxaliplatin/leucovorin/fluorouracil/vectibix   To help prevent nausea and vomiting after your treatment, we encourage you to take your nausea medication as directed.   If you develop nausea and vomiting that is not controlled by your nausea medication, call the clinic.   BELOW ARE SYMPTOMS THAT SHOULD BE REPORTED IMMEDIATELY:  *FEVER GREATER THAN 100.5 F  *CHILLS WITH OR WITHOUT FEVER  NAUSEA AND VOMITING THAT IS NOT CONTROLLED WITH YOUR NAUSEA MEDICATION  *UNUSUAL SHORTNESS OF BREATH  *UNUSUAL BRUISING OR BLEEDING  TENDERNESS IN MOUTH AND THROAT WITH OR WITHOUT PRESENCE OF ULCERS  *URINARY PROBLEMS  *BOWEL PROBLEMS  UNUSUAL RASH Items with * indicate a potential emergency and should be followed up as soon as possible.  Feel free to call the clinic should you have any questions or concerns. The clinic phone number is [phone].  Please show the [facility] at check-in to the Emergency Department and triage nurse.  Panitumumab Solution for Injection What is this medicine? PANITUMUMAB (pan i TOOM ue mab) is a monoclonal antibody. It is used to treat colorectal cancer. This medicine may be used for other purposes; ask your health care provider or pharmacist if you have questions. COMMON BRAND NAME(S): Vectibix What should I tell my health care provider before I take this medicine? They need to know if you have any of these conditions: eye disease, vision problems low levels of calcium, magnesium, or potassium in the blood lung or breathing disease, like asthma skin conditions or sensitivity an unusual or allergic reaction to panitumumab, other medicines, foods, dyes, or preservatives pregnant or trying to get pregnant breast-feeding How should I use this medicine? This drug is given as an infusion into a vein. It is  administered in a hospital or clinic by a specially trained health care professional. Talk to your pediatrician regarding the use of this medicine in children. Special care may be needed. Overdosage: If you think you have taken too much of this medicine contact a poison control center or emergency room at once. NOTE: This medicine is only for you. Do not share this medicine with others. What if I miss a dose? It is important not to miss your dose. Call your doctor or health care professional if you are unable to keep an appointment. What may interact with this medicine? Do not take this medicine with any of the following medications: bevacizumab This list may not describe all possible interactions. Give your health care provider a list of all the medicines, herbs, non-prescription drugs, or dietary supplements you use. Also tell them if you smoke, drink alcohol, or use illegal drugs. Some items may interact with your medicine. What should I watch for while using this medicine? Visit your doctor for checks on your progress. This drug may make you feel generally unwell. This is not uncommon, as chemotherapy can affect healthy cells as well as cancer cells. Report any side effects. Continue your course of treatment even though you feel ill unless your doctor tells you to stop. This medicine can make you more sensitive to the sun. Keep out of the sun while receiving this medicine and for 2 months after the last dose. If you cannot avoid being in the sun, wear protective clothing and use sunscreen. Do not use sun lamps or tanning beds/booths. In some cases, you may be given additional medicines to help with side effects. Follow all directions for  their use. Call your doctor or health care professional for advice if you get a fever, chills or sore throat, or other symptoms of a cold or flu. Do not treat yourself. This drug decreases your body's ability to fight infections. Try to avoid being around people  who are sick. Avoid taking products that contain aspirin, acetaminophen, ibuprofen, naproxen, or ketoprofen unless instructed by your doctor. These medicines may hide a fever. Do not become pregnant while taking this medicine and for 2 months after the last dose. Women should inform their doctor if they wish to become pregnant or think they might be pregnant. There is a potential for serious side effects to an unborn child. Talk to your health care professional or pharmacist for more information. Do not breast-feed an infant while taking this medicine or for 2 months after the last dose. What side effects may I notice from receiving this medicine? Side effects that you should report to your doctor or health care professional as soon as possible: allergic reactions like skin rash, itching or hives, swelling of the face, lips, or tongue breathing problems changes in vision eye pain fast, irregular heartbeat fever, chills mouth sores red spots on the skin redness, blistering, peeling or loosening of the skin, including inside the mouth signs and symptoms of kidney injury like trouble passing urine or change in the amount of urine signs and symptoms of low blood pressure like dizziness; feeling faint or lightheaded, falls; unusually weak or tired signs of low calcium like fast heartbeat, muscle cramps or muscle pain; pain, tingling, numbness in the hands or feet; seizures signs and symptoms of low magnesium like muscle cramps, pain, or weakness; tremors; seizures; or fast, irregular heartbeat signs and symptoms of low potassium like muscle cramps or muscle pain; chest pain; dizziness; feeling faint or lightheaded, falls; palpitations; breathing problems; or fast, irregular heartbeat swelling of the ankles, feet, hands Side effects that usually do not require medical attention (report to your doctor or health care professional if they continue or are bothersome): changes in skin like acne, cracks,  skin dryness diarrhea eyelash growth headache mouth sores nail changes nausea, vomiting This list may not describe all possible side effects. Call your doctor for medical advice about side effects. You may report side effects to FDA at 1-800-FDA-1088. Where should I keep my medicine? This drug is given in a hospital or clinic and will not be stored at home. NOTE: This sheet is a summary. It may not cover all possible information. If you have questions about this medicine, talk to your doctor, pharmacist, or health care provider.  2020 Elsevier/Gold Standard (2015-09-13 16:45:04)

## 2020-02-06 NOTE — Patient Instructions (Signed)

## 2020-02-06 NOTE — Progress Notes (Signed)
Patient discharged in stable condition.

## 2020-02-07 ENCOUNTER — Telehealth: Payer: Self-pay | Admitting: *Deleted

## 2020-02-07 LAB — CANCER ANTIGEN 19-9: CA 19-9: 63 U/mL — ABNORMAL HIGH (ref 0–35)

## 2020-02-08 ENCOUNTER — Telehealth: Payer: Self-pay | Admitting: Nurse Practitioner

## 2020-02-08 ENCOUNTER — Inpatient Hospital Stay: Payer: Medicare Other | Attending: Hematology

## 2020-02-08 ENCOUNTER — Other Ambulatory Visit: Payer: Self-pay

## 2020-02-08 VITALS — BP 160/86 | HR 63 | Temp 98.9°F | Resp 18

## 2020-02-08 DIAGNOSIS — C7802 Secondary malignant neoplasm of left lung: Secondary | ICD-10-CM | POA: Diagnosis not present

## 2020-02-08 DIAGNOSIS — E119 Type 2 diabetes mellitus without complications: Secondary | ICD-10-CM | POA: Insufficient documentation

## 2020-02-08 DIAGNOSIS — Z5112 Encounter for antineoplastic immunotherapy: Secondary | ICD-10-CM | POA: Insufficient documentation

## 2020-02-08 DIAGNOSIS — Z5111 Encounter for antineoplastic chemotherapy: Secondary | ICD-10-CM | POA: Diagnosis present

## 2020-02-08 DIAGNOSIS — R011 Cardiac murmur, unspecified: Secondary | ICD-10-CM | POA: Diagnosis not present

## 2020-02-08 DIAGNOSIS — C787 Secondary malignant neoplasm of liver and intrahepatic bile duct: Secondary | ICD-10-CM | POA: Insufficient documentation

## 2020-02-08 DIAGNOSIS — C801 Malignant (primary) neoplasm, unspecified: Secondary | ICD-10-CM | POA: Diagnosis not present

## 2020-02-08 DIAGNOSIS — C778 Secondary and unspecified malignant neoplasm of lymph nodes of multiple regions: Secondary | ICD-10-CM | POA: Diagnosis not present

## 2020-02-08 DIAGNOSIS — C221 Intrahepatic bile duct carcinoma: Secondary | ICD-10-CM

## 2020-02-08 DIAGNOSIS — Z87891 Personal history of nicotine dependence: Secondary | ICD-10-CM | POA: Insufficient documentation

## 2020-02-08 DIAGNOSIS — Z7984 Long term (current) use of oral hypoglycemic drugs: Secondary | ICD-10-CM | POA: Diagnosis not present

## 2020-02-08 DIAGNOSIS — Z86010 Personal history of colonic polyps: Secondary | ICD-10-CM | POA: Diagnosis not present

## 2020-02-08 DIAGNOSIS — I10 Essential (primary) hypertension: Secondary | ICD-10-CM | POA: Insufficient documentation

## 2020-02-08 MED ORDER — SODIUM CHLORIDE 0.9% FLUSH
10.0000 mL | INTRAVENOUS | Status: DC | PRN
  Administered 2020-02-08: 10 mL
  Filled 2020-02-08: qty 10

## 2020-02-08 MED ORDER — HEPARIN SOD (PORK) LOCK FLUSH 100 UNIT/ML IV SOLN
500.0000 [IU] | Freq: Once | INTRAVENOUS | Status: AC | PRN
  Administered 2020-02-08: 500 [IU]
  Filled 2020-02-08: qty 5

## 2020-02-08 NOTE — Telephone Encounter (Signed)
Scheduled appts per 11/30 los. Pt to get updated appt calendar at 12/2 visit per appt notes.

## 2020-02-16 ENCOUNTER — Other Ambulatory Visit: Payer: Self-pay | Admitting: Hematology

## 2020-02-20 NOTE — Progress Notes (Signed)
[name]   Telephone:([phone]) [phone] [phone]   Clinic Follow up Note   Patient Care Team: [name], MD as PCP - General (Internal Medicine) [name], RN as Oncology Nurse Navigator [name], MD as Consulting Physician (Oncology) [name], MD as Consulting Physician (Gastroenterology) 02/21/2020  CHIEF COMPLAINT: Follow up colon cancer   SUMMARY OF ONCOLOGIC HISTORY: Oncology History Overview Note  Cancer Staging No matching staging information was found for the patient.    metastatic colon cancer to liver  06/20/2019 Procedure   Colonoscopy by Dr [name] 06/20/19  IMPRESSION -Seven 3 to 10 mm polyps in the sigmoid colon, in the transverse colon and in the escending colon, removed with a cold snare. Resected and retrireved.  -One 24m polyp in the descending colon. Biopsies. Tattoes.  -Mediaum sized lipoma in the ascending colon.   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 See Comment B. Colon, Ascending, polyp, Polypectomy:  -TUBULAR ADENOMA -No high grade dysplasia or malignancy.  C. Colon, TRansverse, Polyo, polectomy:  -TUBULAR ADENOMA -No high grade dysplasia or malignancy.  D. Colon, Sigmoid, Polyp, Polypectomy:  -HYPERPLASTIC POLYP   11/07/2019 Imaging   [place]oreaAbdomen 11/07/19  IMPRESSION: 1. Two solid masses in the liver are nonspecific. Recommend MRI abdomen with and without contrast for further evaluation.   12/15/2019 Imaging   MRI Abdomen 12/15/19  IMPRESSION: 1. There are two large masses in the liver with appearance favoring metastatic disease or hepatocellular carcinoma or cholangiocarcinoma. A benign etiology is highly unlikely given the enhancement pattern and associated adenopathy. 2. Considerable porta hepatis and retroperitoneal adenopathy. Some of the confluent porta hepatis tumor is potentially infiltrative and abuts the  pancreatic body along its right upper margin, making it difficult to completely exclude the possibility of pancreatic adenocarcinoma primary. Possibilities helpful in further workup might include tissue diagnosis, endoscopic ultrasound, or nuclear medicine PET-CT. 3. Pancreas divisum. 4. Lumbar spondylosis and degenerative disc disease. 5. Despite efforts by the technologist and patient, motion artifact is present on today's exam and could not be eliminated. This reduces exam sensitivity and specificity.   12/22/2019 Procedure   Upper Endoscopy by Dr [name]10/15/21  IMPRESSION - One lymph node was visualized and measured in the porta hepatis region. Fine needle aspiration performed    12/22/2019 Initial Biopsy   A. LIVER, PORTA HEPATIS MASS, FINE NEEDLE 12/22/19 ASPIRATION:  FINAL MICROSCOPIC DIAGNOSIS:  - Malignant cells consistent with metastatic adenocarcinoma   01/01/2020 Initial Diagnosis   Intrahepatic cholangiocarcinoma ([facility]   01/08/2020 Initial Biopsy   FINAL MICROSCOPIC DIAGNOSIS:   A. LIVER, LEFT LOBE, BIOPSY:  - Metastatic adenocarcinoma, consistent with a colorectal primary.  See  comment      COMMENT:   Immunohistochemical stains show the tumor cells are positive for CK20  and CDX2 but negative for CK7, consistent with above interpretation.  Dr. [name] notified on 01/10/2020   01/08/2020 Genetic Testing   [name]  [name] wildtype and [name]/[name] mutations which make her eligible for target biological agent Vectibix.   01/24/2020 Procedure   PAC placed 01/24/20   01/25/2020 -  Chemotherapy   first line FOLFOX starting 01/25/2020, plan to add Panitumumab with C2 or C3     CURRENT THERAPY: first line FOLFOX starting 01/25/2020, plan to add Vextibix with C2 or C3  INTERVAL HISTORY: Ms. FLandersreturns for follow up and treatment as scheduled. She received C2 FOLFOX on 11/30 and vectibix  was added.  She developed a skin rash on her face and mild mouth  sores.  She remained able to eat and drink.  She applied clindamycin to her skin rash which helped.  She has cold sensitivity lasting 1-2 weeks, no residual neuropathy.  Energy is adequate, she was able to do yard work recently.  She has mild nausea and constipation on days 1 and 2, managed with medication.  Denies abdominal pain, blood in stool, any fever, chills, cough, chest pain, dyspnea or other new concerns.   MEDICAL HISTORY:  Past Medical History:  Diagnosis Date  . Arthritis   . Diabetes mellitus without complication ([place])   . Hypertension     SURGICAL HISTORY: Past Surgical History:  Procedure Laterality Date  . ABDOMINAL HYSTERECTOMY    . ANTERIOR AND POSTERIOR REPAIR WITH SACROSPINOUS FIXATION N/A 07/16/2016   Procedure: ANTERIOR AND POSTERIOR REPAIR WITH SACROSPINOUS FIXATION;  Surgeon: Everlene Farrier, MD;  Location: [facility];  Service: Gynecology;  Laterality: N/A;  . CYSTOSCOPY  07/16/2016   Procedure: CYSTOSCOPY;  Surgeon: Everlene Farrier, MD;  Location: [facility];  Service: Gynecology;;  . ESOPHAGOGASTRODUODENOSCOPY (EGD) WITH PROPOFOL N/A 12/22/2019   Procedure: ESOPHAGOGASTRODUODENOSCOPY (EGD) WITH PROPOFOL;  Surgeon: [name], MD;  Location: [facility];  Service: Endoscopy;  Laterality: N/A;  . FINE NEEDLE ASPIRATION N/A 12/22/2019   Procedure: FINE NEEDLE ASPIRATION (FNA) LINEAR;  Surgeon: [name], MD;  Location: [facility];  Service: Endoscopy;  Laterality: N/A;  . IR IMAGING GUIDED PORT INSERTION  01/24/2020  . LAPAROSCOPIC VAGINAL HYSTERECTOMY WITH SALPINGECTOMY Bilateral 07/16/2016   Procedure: LAPAROSCOPIC ASSISTED VAGINAL HYSTERECTOMY WITH SALPINGECTOMY;  Surgeon: Everlene Farrier, MD;  Location: [facility];  Service: Gynecology;  Laterality: Bilateral;  . MYOMECTOMY ABDOMINAL APPROACH    . THYROID SURGERY    . tyroid    . UPPER ESOPHAGEAL ENDOSCOPIC ULTRASOUND (EUS) N/A 12/22/2019   Procedure: UPPER ESOPHAGEAL ENDOSCOPIC ULTRASOUND (EUS);  Surgeon: [name], MD;  Location: [facility];  Service: Endoscopy;  Laterality: N/A;    I have reviewed the social history and family history with the patient and they are unchanged from previous note.  ALLERGIES:  has No Known Allergies.  MEDICATIONS:  Current Outpatient Medications  Medication Sig Dispense Refill  . aspirin EC 81 MG tablet Take 1 tablet (81 mg total) by mouth daily. 90 tablet 3  . atorvastatin (LIPITOR) 20 MG tablet Take 20 mg by mouth at bedtime.   1  . clindamycin (CLINDAGEL) 1 % gel Apply topically 2 (two) times daily. 60 g 2  . clobetasol cream (TEMOVATE) 9.52 % Apply 1 application topically daily.    Marland Kitchen esomeprazole (NEXIUM) 40 MG capsule Take 40 mg by mouth daily.    . ferrous sulfate 325 (65 FE) MG tablet Take 1 tablet by mouth daily.    Marland Kitchen lidocaine-prilocaine (EMLA) cream Apply 1 application topically as needed. 30 g 1  . lisinopril-hydrochlorothiazide (ZESTORETIC) 20-12.5 MG tablet Take 1 tablet by mouth daily with breakfast.     . ondansetron (ZOFRAN) 8 MG tablet Take 1 tablet (8 mg total) by mouth every 8 (eight) hours as needed for nausea or vomiting. (Patient not taking: Reported on 02/06/2020) 20 tablet 1  . prochlorperazine (COMPAZINE) 10 MG tablet Take 1 tablet (10 mg total) by mouth every 6 (six) hours as needed for nausea or vomiting. 30 tablet 0  . spironolactone (ALDACTONE) 25 MG tablet Take 25 mg by mouth daily with breakfast.  (Patient not taking: Reported on 01/22/2020)    .  traMADol (ULTRAM) 50 MG tablet Take 1 tablet (50 mg total) by mouth every 6 (six) hours as needed. 15 tablet 0   No current facility-administered medications for this visit.    PHYSICAL EXAMINATION: ECOG PERFORMANCE STATUS: 0 - Asymptomatic  Vitals:   02/21/20 1023  BP: (!) 141/82  Pulse: 70  Resp: 15  Temp: 97.9 F (36.6 C)  SpO2: 99%   Filed Weights   02/21/20 1023  Weight: 181 lb 4.8 oz (82.2 kg)    GENERAL:alert, no distress and comfortable SKIN: Mild acne type skin  rash to face.  Palms without erythema EYES: sclera clear OROPHARYNX: No thrush or ulcers LUNGS:  normal breathing effort HEART: no lower extremity edema NEURO: alert & oriented x 3 with fluent speech, no focal motor/sensory deficits PAC without erythema   LABORATORY DATA:  I have reviewed the data as listed CBC Latest Ref Rng & Units 02/21/2020 02/06/2020 01/22/2020  WBC 4.0 - 10.5 K/uL 5.2 6.8 10.5  Hemoglobin 12.0 - 15.0 g/dL 12.7 12.2 13.0  Hematocrit 36.0 - 46.0 % 38.1 37.2 39.1  Platelets 150 - 400 K/uL 152 331 353     CMP Latest Ref Rng & Units 02/06/2020 01/22/2020 01/05/2020  Glucose 70 - 99 mg/dL 111(H) 107(H) 109(H)  BUN 8 - 23 mg/dL 11 9 11   Creatinine 0.44 - 1.00 mg/dL 0.77 0.78 0.72  Sodium 135 - 145 mmol/L 142 142 141  Potassium 3.5 - 5.1 mmol/L 3.4(L) 3.6 3.7  Chloride 98 - 111 mmol/L 100 100 102  CO2 22 - 32 mmol/L 30 31 28   Calcium 8.9 - 10.3 mg/dL 9.6 9.6 9.8  Total Protein 6.5 - 8.1 g/dL 7.5 8.3(H) 8.2(H)  Total Bilirubin 0.3 - 1.2 mg/dL 0.4 0.4 0.5  Alkaline Phos 38 - 126 U/L 140(H) 158(H) 150(H)  AST 15 - 41 U/L 17 22 27   ALT 0 - 44 U/L 19 22 26       RADIOGRAPHIC STUDIES: I have personally reviewed the radiological images as listed and agreed with the findings in the report. No results found.   ASSESSMENT & PLAN: Lindsay [name] a 64 y.o.femalewith   1. 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 -Her EUS from 12/22/19 showed 3.5cm LN in porta hepatis regionand biopsy confirmed metastatic adenocarcinoma.Preliminary cytology sample is not adequate for any additional IHC study for the origin of tumor, the morphology does not support HCC. -Her EGD was negative formalignancy. Her colonoscopy in March 2021 didshow a large 2 cm polyps in descending colon, biopsy showed  high-grade dysplasia. -Liver biopsy confirmed adenocarcinoma, immunostain showed positive CDX2 and CK20, negative CK7, supporting primary colorectalcancer  -PET scan showed known liver metastasis, and diffuse thoracic and abdominal adenopathy. There isa hypermetabolic 1.8 cm pulmonary nodule in left upper lobe, and focal hypermetabolic lesion in the splenic fixture of the colon,concerning for primary tumor. -She underwent repeat colonoscopy and biopsy by Dr. Benson Norway last week, I have requested the report and path.  -additional IHC testing with TTF-1 shows focal staining, which can be seen in 5-10% of colorectal primary cancers as well as lung cancer. However given the above, the overall picture is still consistent with colorectal primary.  -She began first-line FOLFOX every 2 weeks starting 01/25/2020, goal is palliative -FO showed [name]/[name]/BRAF wild-type, she is eligible for EGFR inhibitor and started vectibix with cycle 2 -Plan to restage after cycle 5  2.  Skin rash -She developed mild acne type rash on her face after cycle one Panitumumab -We reviewed symptom management, continue Clindagel and add hydrocortisone, alternate agents -Plan to add doxycycline if not controlled with topicals  3. Chronic Lower abdominal pain, Nausea  -She has had chronic low abdominal pain for 5 years ranging up to 4 times a week. She notes her pain worsened in the last 3 years, but no work-up until 2021.  -Her pain is 5/10 at most but when high not managed on Motrin.   She can use Tylenol for mild pain and tramadol for severe pain -Manages constipation with Metamucil and MiraLAX as needed, the only days 1-2 of treatment -Denies pain  4. HTN, DM, Heart Murmur, Arthritis  -Per PCP and Cardiologist Dr Einar Gip -DM controlled, no pre-existing neuropathy -We will monitor for elevated BG and neuropathy on chemo  5. 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  -She lives with  husband and has brothers and sister in and out of town. Her only child passed in recent years.  -Her husband was present for today's visit  6. Goals of care -we previously discussed the incurable but treatable nature of her stage IV cancer and general prognosis. She understands the prognosis is poor if she does not tolerate or respond well to chemo -she understands the goal is palliative   Disposition Ms. Sweney appears stable.  She completed two cycles of FOLFOX and one cycle of Panitumumab, she tolerates treatment well with mild skin rash, nausea/constipation, and cold sensitivity.  Side effects are well managed with supportive meds at home.  She will continue Clindagel and add hydrocortisone and alternate.  If she develops recurrent mucositis she will start salt/soda mouth rinse.  She is able to recover and function well.  Labs reviewed, CBC is normal.  If CMP and mag are stable she will proceed with C3 FOLFOX and Panitumumab today as planned.  She will return for follow-up and cycle 4 in 2 weeks, we will repeat CEA at that time.  Plan is to restage after 5 cycles.  All questions were answered. The patient knows to call the clinic with any problems, questions or concerns. No barriers to learning was detected.     Alla Feeling, NP 02/21/20

## 2020-02-21 ENCOUNTER — Inpatient Hospital Stay: Payer: Medicare Other

## 2020-02-21 ENCOUNTER — Encounter: Payer: Self-pay | Admitting: Nurse Practitioner

## 2020-02-21 ENCOUNTER — Inpatient Hospital Stay: Payer: Medicare Other | Admitting: Nurse Practitioner

## 2020-02-21 ENCOUNTER — Other Ambulatory Visit: Payer: Self-pay

## 2020-02-21 VITALS — BP 141/82 | HR 70 | Temp 97.9°F | Resp 15 | Ht 66.0 in | Wt 181.3 lb

## 2020-02-21 DIAGNOSIS — C221 Intrahepatic bile duct carcinoma: Secondary | ICD-10-CM

## 2020-02-21 DIAGNOSIS — Z95828 Presence of other vascular implants and grafts: Secondary | ICD-10-CM | POA: Insufficient documentation

## 2020-02-21 DIAGNOSIS — Z7189 Other specified counseling: Secondary | ICD-10-CM

## 2020-02-21 DIAGNOSIS — Z5112 Encounter for antineoplastic immunotherapy: Secondary | ICD-10-CM | POA: Diagnosis not present

## 2020-02-21 LAB — CMP (CANCER CENTER ONLY)
ALT: 29 U/L (ref 0–44)
AST: 25 U/L (ref 15–41)
Albumin: 4.2 g/dL (ref 3.5–5.0)
Alkaline Phosphatase: 110 U/L (ref 38–126)
Anion gap: 5 (ref 5–15)
BUN: 10 mg/dL (ref 8–23)
CO2: 31 mmol/L (ref 22–32)
Calcium: 9.4 mg/dL (ref 8.9–10.3)
Chloride: 105 mmol/L (ref 98–111)
Creatinine: 0.66 mg/dL (ref 0.44–1.00)
GFR, Estimated: >60 mL/min (ref >60)
Glucose, Bld: 129 mg/dL — ABNORMAL HIGH (ref 70–99)
Potassium: 3.7 mmol/L (ref 3.5–5.1)
Sodium: 141 mmol/L (ref 135–145)
Total Bilirubin: 0.4 mg/dL (ref 0.3–1.2)
Total Protein: 7.9 g/dL (ref 6.5–8.1)

## 2020-02-21 LAB — CBC WITH DIFFERENTIAL (CANCER CENTER ONLY)
Abs Immature Granulocytes: 0.02 10*3/uL (ref 0.00–0.07)
Basophils Absolute: 0 10*3/uL (ref 0.0–0.1)
Basophils Relative: 1 %
Eosinophils Absolute: 0.1 10*3/uL (ref 0.0–0.5)
Eosinophils Relative: 1 %
HCT: 38.1 % (ref 36.0–46.0)
Hemoglobin: 12.7 g/dL (ref 12.0–15.0)
Immature Granulocytes: 0 %
Lymphocytes Relative: 16 %
Lymphs Abs: 0.9 10*3/uL (ref 0.7–4.0)
MCH: 29.7 pg (ref 26.0–34.0)
MCHC: 33.3 g/dL (ref 30.0–36.0)
MCV: 89 fL (ref 80.0–100.0)
Monocytes Absolute: 0.7 10*3/uL (ref 0.1–1.0)
Monocytes Relative: 13 %
Neutro Abs: 3.6 10*3/uL (ref 1.7–7.7)
Neutrophils Relative %: 69 %
Platelet Count: 152 10*3/uL (ref 150–400)
RBC: 4.28 MIL/uL (ref 3.87–5.11)
RDW: 13.8 % (ref 11.5–15.5)
WBC Count: 5.2 10*3/uL (ref 4.0–10.5)
nRBC: 0 % (ref 0.0–0.2)

## 2020-02-21 LAB — MAGNESIUM: Magnesium: 2 mg/dL (ref 1.7–2.4)

## 2020-02-21 MED ORDER — DEXTROSE 5 % IV SOLN
Freq: Once | INTRAVENOUS | Status: AC
  Filled 2020-02-21: qty 250

## 2020-02-21 MED ORDER — DEXTROSE 5 % IV SOLN
400.0000 mg/m2 | Freq: Once | INTRAVENOUS | Status: AC
  Administered 2020-02-21: 14:00:00 792 mg via INTRAVENOUS
  Filled 2020-02-21: qty 39.6

## 2020-02-21 MED ORDER — SODIUM CHLORIDE 0.9% FLUSH
10.0000 mL | Freq: Once | INTRAVENOUS | Status: AC
  Administered 2020-02-21: 10:00:00 10 mL
  Filled 2020-02-21: qty 10

## 2020-02-21 MED ORDER — OXALIPLATIN CHEMO INJECTION 100 MG/20ML
85.0000 mg/m2 | Freq: Once | INTRAVENOUS | Status: AC
  Administered 2020-02-21: 14:00:00 170 mg via INTRAVENOUS
  Filled 2020-02-21: qty 34

## 2020-02-21 MED ORDER — SODIUM CHLORIDE 0.9 % IV SOLN
6.0000 mg/kg | Freq: Once | INTRAVENOUS | Status: AC
  Administered 2020-02-21: 13:00:00 500 mg via INTRAVENOUS
  Filled 2020-02-21: qty 20

## 2020-02-21 MED ORDER — PALONOSETRON HCL INJECTION 0.25 MG/5ML
0.2500 mg | Freq: Once | INTRAVENOUS | Status: AC
Start: 2020-02-21 — End: 2020-02-21
  Administered 2020-02-21: 12:00:00 0.25 mg via INTRAVENOUS

## 2020-02-21 MED ORDER — SODIUM CHLORIDE 0.9 % IV SOLN
2400.0000 mg/m2 | INTRAVENOUS | Status: DC
  Administered 2020-02-21: 16:00:00 4750 mg via INTRAVENOUS
  Filled 2020-02-21: qty 95

## 2020-02-21 MED ORDER — DEXTROSE 5 % IV SOLN
Freq: Once | INTRAVENOUS | Status: DC
  Filled 2020-02-21: qty 250

## 2020-02-21 MED ORDER — SODIUM CHLORIDE 0.9 % IV SOLN
10.0000 mg | Freq: Once | INTRAVENOUS | Status: AC
  Administered 2020-02-21: 12:00:00 10 mg via INTRAVENOUS
  Filled 2020-02-21: qty 10

## 2020-02-21 MED ORDER — PALONOSETRON HCL INJECTION 0.25 MG/5ML
INTRAVENOUS | Status: AC
  Filled 2020-02-21: qty 5

## 2020-02-21 MED ORDER — SODIUM CHLORIDE 0.9 % IV SOLN
Freq: Once | INTRAVENOUS | Status: AC
  Filled 2020-02-21: qty 250

## 2020-02-21 NOTE — Patient Instructions (Signed)
[facility] Discharge Instructions for Patients Receiving Chemotherapy  Today you received the following chemotherapy agents Vectibix, oxaliplatin, leucovorin, and 5FU  To help prevent nausea and vomiting after your treatment, we encourage you to take your nausea medication as directed   If you develop nausea and vomiting that is not controlled by your nausea medication, call the clinic.   BELOW ARE SYMPTOMS THAT SHOULD BE REPORTED IMMEDIATELY:  *FEVER GREATER THAN 100.5 F  *CHILLS WITH OR WITHOUT FEVER  NAUSEA AND VOMITING THAT IS NOT CONTROLLED WITH YOUR NAUSEA MEDICATION  *UNUSUAL SHORTNESS OF BREATH  *UNUSUAL BRUISING OR BLEEDING  TENDERNESS IN MOUTH AND THROAT WITH OR WITHOUT PRESENCE OF ULCERS  *URINARY PROBLEMS  *BOWEL PROBLEMS  UNUSUAL RASH Items with * indicate a potential emergency and should be followed up as soon as possible.  Feel free to call the clinic should you have any questions or concerns. The clinic phone number is [phone].  Please show the [place] at check-in to the Emergency Department and triage nurse.

## 2020-02-21 NOTE — Progress Notes (Signed)
Nutrition Follow-up:  Patient with metastatic adenocarcinoma in the liver, nodes and lung, likely colorectal primary.  Patient receiving folfox and bevacizumab.  Met with patient during infusion.  Patient reports that she has a good appetite.  Reports that she ate smoked sausage for breakfast this am and [place] sandwich for lunch. Dinner last night was baked chicken, mashed potatoes and pinto beans.  Reports that she has been eating more ice cream recently, cold does not bother her except for in hands.  Reports mild mouth sores but did not effect intake Reports normal bowel movements, takes metamucil everyday.  Some constipation after treatment but usually resolved in 1 day.  Medications: reviewed  Labs: reviewed  Anthropometrics:   Weight 181 lb 4.8 decreased from 184 lb 11.2 oz on 11/30   NUTRITION DIAGNOSIS: Inadequate oral intake stable   INTERVENTION:  Discussed strategies for sore mouth if returns with this treatment.  Handouts provided Encouraged high calorie, high protein foods Cautioned against cold foods with oxaliplatin    MONITORING, EVALUATION, GOAL: weight trends, intake   NEXT VISIT: Tuesday, Jan 25 during infusion  Lindsay [name], [place], [place] Registered Dietitian [phone] (mobile)

## 2020-02-22 ENCOUNTER — Telehealth: Payer: Self-pay | Admitting: Nurse Practitioner

## 2020-02-22 NOTE — Telephone Encounter (Signed)
Rescheduled appointments on 12/25 per 12/15 los. Rescheduled appointments to 12/26 because Dr. Burr Medico does not see patient's on Tuesdays. Called patient with scheduling change, no answer. Left message with updated appointments dates and times.

## 2020-02-23 ENCOUNTER — Other Ambulatory Visit: Payer: Self-pay

## 2020-02-23 ENCOUNTER — Inpatient Hospital Stay: Payer: Medicare Other

## 2020-02-23 VITALS — BP 133/73 | HR 76 | Resp 16

## 2020-02-23 DIAGNOSIS — Z5112 Encounter for antineoplastic immunotherapy: Secondary | ICD-10-CM | POA: Diagnosis not present

## 2020-02-23 DIAGNOSIS — C221 Intrahepatic bile duct carcinoma: Secondary | ICD-10-CM

## 2020-02-23 MED ORDER — SODIUM CHLORIDE 0.9% FLUSH
10.0000 mL | INTRAVENOUS | Status: DC | PRN
  Administered 2020-02-23: 14:00:00 10 mL
  Filled 2020-02-23: qty 10

## 2020-02-23 MED ORDER — HEPARIN SOD (PORK) LOCK FLUSH 100 UNIT/ML IV SOLN
500.0000 [IU] | Freq: Once | INTRAVENOUS | Status: AC | PRN
  Administered 2020-02-23: 14:00:00 500 [IU]
  Filled 2020-02-23: qty 5

## 2020-03-05 ENCOUNTER — Inpatient Hospital Stay: Payer: Medicare Other

## 2020-03-05 ENCOUNTER — Ambulatory Visit: Payer: Medicare Other

## 2020-03-05 ENCOUNTER — Inpatient Hospital Stay (HOSPITAL_BASED_OUTPATIENT_CLINIC_OR_DEPARTMENT_OTHER): Payer: Medicare Other | Admitting: Nurse Practitioner

## 2020-03-05 ENCOUNTER — Other Ambulatory Visit: Payer: Medicare Other

## 2020-03-05 ENCOUNTER — Encounter: Payer: Self-pay | Admitting: Nurse Practitioner

## 2020-03-05 ENCOUNTER — Other Ambulatory Visit: Payer: Self-pay

## 2020-03-05 ENCOUNTER — Ambulatory Visit: Payer: Medicare Other | Admitting: Physician Assistant

## 2020-03-05 VITALS — BP 151/87 | HR 80 | Temp 97.0°F | Resp 17 | Ht 66.0 in | Wt 181.2 lb

## 2020-03-05 DIAGNOSIS — C221 Intrahepatic bile duct carcinoma: Secondary | ICD-10-CM

## 2020-03-05 DIAGNOSIS — Z5112 Encounter for antineoplastic immunotherapy: Secondary | ICD-10-CM | POA: Diagnosis not present

## 2020-03-05 DIAGNOSIS — Z7189 Other specified counseling: Secondary | ICD-10-CM

## 2020-03-05 DIAGNOSIS — C189 Malignant neoplasm of colon, unspecified: Secondary | ICD-10-CM | POA: Diagnosis not present

## 2020-03-05 DIAGNOSIS — Z95828 Presence of other vascular implants and grafts: Secondary | ICD-10-CM

## 2020-03-05 LAB — CBC WITH DIFFERENTIAL (CANCER CENTER ONLY)
Abs Immature Granulocytes: 0.04 10*3/uL (ref 0.00–0.07)
Basophils Absolute: 0 10*3/uL (ref 0.0–0.1)
Basophils Relative: 1 %
Eosinophils Absolute: 0.1 10*3/uL (ref 0.0–0.5)
Eosinophils Relative: 2 %
HCT: 39.2 % (ref 36.0–46.0)
Hemoglobin: 13 g/dL (ref 12.0–15.0)
Immature Granulocytes: 1 %
Lymphocytes Relative: 16 %
Lymphs Abs: 0.9 10*3/uL (ref 0.7–4.0)
MCH: 29.2 pg (ref 26.0–34.0)
MCHC: 33.2 g/dL (ref 30.0–36.0)
MCV: 88.1 fL (ref 80.0–100.0)
Monocytes Absolute: 0.6 10*3/uL (ref 0.1–1.0)
Monocytes Relative: 11 %
Neutro Abs: 4 10*3/uL (ref 1.7–7.7)
Neutrophils Relative %: 69 %
Platelet Count: 152 10*3/uL (ref 150–400)
RBC: 4.45 MIL/uL (ref 3.87–5.11)
RDW: 14.5 % (ref 11.5–15.5)
WBC Count: 5.7 10*3/uL (ref 4.0–10.5)
nRBC: 0 % (ref 0.0–0.2)

## 2020-03-05 LAB — CMP (CANCER CENTER ONLY)
ALT: 24 U/L (ref 0–44)
AST: 24 U/L (ref 15–41)
Albumin: 3.8 g/dL (ref 3.5–5.0)
Alkaline Phosphatase: 96 U/L (ref 38–126)
Anion gap: 8 (ref 5–15)
BUN: 16 mg/dL (ref 8–23)
CO2: 27 mmol/L (ref 22–32)
Calcium: 9.8 mg/dL (ref 8.9–10.3)
Chloride: 103 mmol/L (ref 98–111)
Creatinine: 0.72 mg/dL (ref 0.44–1.00)
GFR, Estimated: >60 mL/min (ref >60)
Glucose, Bld: 127 mg/dL — ABNORMAL HIGH (ref 70–99)
Potassium: 3.7 mmol/L (ref 3.5–5.1)
Sodium: 138 mmol/L (ref 135–145)
Total Bilirubin: 0.3 mg/dL (ref 0.3–1.2)
Total Protein: 7.7 g/dL (ref 6.5–8.1)

## 2020-03-05 LAB — MAGNESIUM: Magnesium: 1.6 mg/dL — ABNORMAL LOW (ref 1.7–2.4)

## 2020-03-05 LAB — CEA (IN HOUSE-CHCC): CEA (CHCC-In House): 60.5 ng/mL — ABNORMAL HIGH (ref 0.00–5.00)

## 2020-03-05 MED ORDER — DEXAMETHASONE SODIUM PHOSPHATE 100 MG/10ML IJ SOLN
10.0000 mg | Freq: Once | INTRAMUSCULAR | Status: AC
  Administered 2020-03-05: 12:00:00 10 mg via INTRAVENOUS
  Filled 2020-03-05: qty 10

## 2020-03-05 MED ORDER — SODIUM CHLORIDE 0.9% FLUSH
10.0000 mL | Freq: Once | INTRAVENOUS | Status: AC
  Administered 2020-03-05: 10:00:00 10 mL
  Filled 2020-03-05: qty 10

## 2020-03-05 MED ORDER — SODIUM CHLORIDE 0.9 % IV SOLN
2400.0000 mg/m2 | INTRAVENOUS | Status: DC
  Administered 2020-03-05: 16:00:00 4750 mg via INTRAVENOUS
  Filled 2020-03-05: qty 95

## 2020-03-05 MED ORDER — SODIUM CHLORIDE 0.9 % IV SOLN
Freq: Once | INTRAVENOUS | Status: AC
  Filled 2020-03-05: qty 250

## 2020-03-05 MED ORDER — OXALIPLATIN CHEMO INJECTION 100 MG/20ML
85.0000 mg/m2 | Freq: Once | INTRAVENOUS | Status: AC
  Administered 2020-03-05: 13:00:00 170 mg via INTRAVENOUS
  Filled 2020-03-05: qty 34

## 2020-03-05 MED ORDER — SODIUM CHLORIDE 0.9 % IV SOLN
Freq: Once | INTRAVENOUS | Status: DC
  Filled 2020-03-05: qty 250

## 2020-03-05 MED ORDER — DOXYCYCLINE HYCLATE 100 MG PO TABS: 100.0000 mg | ORAL_TABLET | Freq: Two times a day (BID) | ORAL | 1 refills | Status: DC

## 2020-03-05 MED ORDER — PALONOSETRON HCL INJECTION 0.25 MG/5ML
0.2500 mg | Freq: Once | INTRAVENOUS | Status: AC
  Administered 2020-03-05: 12:00:00 0.25 mg via INTRAVENOUS

## 2020-03-05 MED ORDER — SODIUM CHLORIDE 0.9 % IV SOLN
6.0000 mg/kg | Freq: Once | INTRAVENOUS | Status: AC
  Administered 2020-03-05: 13:00:00 500 mg via INTRAVENOUS
  Filled 2020-03-05: qty 20

## 2020-03-05 MED ORDER — DEXTROSE 5 % IV SOLN
Freq: Once | INTRAVENOUS | Status: AC
  Filled 2020-03-05: qty 250

## 2020-03-05 MED ORDER — DEXTROSE 5 % IV SOLN
400.0000 mg/m2 | Freq: Once | INTRAVENOUS | Status: AC
  Administered 2020-03-05: 13:00:00 792 mg via INTRAVENOUS
  Filled 2020-03-05: qty 39.6

## 2020-03-05 MED ORDER — PALONOSETRON HCL INJECTION 0.25 MG/5ML
INTRAVENOUS | Status: AC
  Filled 2020-03-05: qty 5

## 2020-03-05 MED ORDER — MAGNESIUM OXIDE 400 (241.3 MG) MG PO TABS: 400.0000 mg | ORAL_TABLET | Freq: Two times a day (BID) | ORAL | 1 refills | Status: DC

## 2020-03-05 NOTE — Patient Instructions (Signed)
[facility] Discharge Instructions for Patients Receiving Chemotherapy  Today you received the following chemotherapy agents: Vectibix, oxaliplatin, leucovorin, 5FU  To help prevent nausea and vomiting after your treatment, we encourage you to take your nausea medication as directed.    If you develop nausea and vomiting that is not controlled by your nausea medication, call the clinic.   BELOW ARE SYMPTOMS THAT SHOULD BE REPORTED IMMEDIATELY:  *FEVER GREATER THAN 100.5 F  *CHILLS WITH OR WITHOUT FEVER  NAUSEA AND VOMITING THAT IS NOT CONTROLLED WITH YOUR NAUSEA MEDICATION  *UNUSUAL SHORTNESS OF BREATH  *UNUSUAL BRUISING OR BLEEDING  TENDERNESS IN MOUTH AND THROAT WITH OR WITHOUT PRESENCE OF ULCERS  *URINARY PROBLEMS  *BOWEL PROBLEMS  UNUSUAL RASH Items with * indicate a potential emergency and should be followed up as soon as possible.  Feel free to call the clinic should you have any questions or concerns. The clinic phone number is [phone].  Please show the CHEMO ALERT CARD at check-in to the Emergency Department and triage nurse.

## 2020-03-05 NOTE — Progress Notes (Signed)
[place]   Telephone:([phone]) [phone] Fax:([phone]) [phone]   Clinic Follow up Note   Patient Care Team: [name], MD as PCP - General (Internal Medicine) [name], RN as Oncology Nurse Navigator [name], MD as Consulting Physician (Oncology) [name], MD as Consulting Physician (Gastroenterology) 03/05/2020  CHIEF COMPLAINT: Follow up colon cancer   SUMMARY OF ONCOLOGIC HISTORY: Oncology History Overview Note  Cancer Staging No matching staging information was found for the patient.    metastatic colon cancer to liver  06/20/2019 Procedure   Colonoscopy by Dr [name] 06/20/19  IMPRESSION -Seven 3 to 10 mm polyps in the sigmoid colon, in the transverse colon and in the escending colon, removed with a cold snare. Resected and retrireved.  -One 71m polyp in the descending colon. Biopsies. Tattoes.  -Mediaum sized lipoma in the ascending colon.   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 See Comment B. Colon, Ascending, polyp, Polypectomy:  -TUBULAR ADENOMA -No high grade dysplasia or malignancy.  C. Colon, TRansverse, Polyo, polectomy:  -TUBULAR ADENOMA -No high grade dysplasia or malignancy.  D. Colon, Sigmoid, Polyp, Polypectomy:  -HYPERPLASTIC POLYP   11/07/2019 Imaging   [place]domen 11/07/19  IMPRESSION: 1. Two solid masses in the liver are nonspecific. Recommend MRI abdomen with and without contrast for further evaluation.   12/15/2019 Imaging   MRI Abdomen 12/15/19  IMPRESSION: 1. There are two large masses in the liver with appearance favoring metastatic disease or hepatocellular carcinoma or cholangiocarcinoma. A benign etiology is highly unlikely given the enhancement pattern and associated adenopathy. 2. Considerable porta hepatis and retroperitoneal adenopathy. Some of the confluent porta hepatis tumor is potentially infiltrative and abuts the  pancreatic body along its right upper margin, making it difficult to completely exclude the possibility of pancreatic adenocarcinoma primary. Possibilities helpful in further workup might include tissue diagnosis, endoscopic ultrasound, or nuclear medicine PET-CT. 3. Pancreas divisum. 4. Lumbar spondylosis and degenerative disc disease. 5. Despite efforts by the technologist and patient, motion artifact is present on today's exam and could not be eliminated. This reduces exam sensitivity and specificity.   12/22/2019 Procedure   Upper Endoscopy by Dr [name]10/15/21  IMPRESSION - One lymph node was visualized and measured in the porta hepatis region. Fine needle aspiration performed    12/22/2019 Initial Biopsy   A. LIVER, PORTA HEPATIS MASS, FINE NEEDLE 12/22/19 ASPIRATION:  FINAL MICROSCOPIC DIAGNOSIS:  - Malignant cells consistent with metastatic adenocarcinoma   01/01/2020 Initial Diagnosis   Intrahepatic cholangiocarcinoma ([facility]   01/08/2020 Initial Biopsy   FINAL MICROSCOPIC DIAGNOSIS:   A. LIVER, LEFT LOBE, BIOPSY:  - Metastatic adenocarcinoma, consistent with a colorectal primary.  See  comment      COMMENT:   Immunohistochemical stains show the tumor cells are positive for CK20  and CDX2 but negative for CK7, consistent with above interpretation.  Dr. [name] notified on 01/10/2020   01/08/2020 Genetic Testing   [name]  [name] wildtype and [name]/[name] mutations which make her eligible for target biological agent Vectibix.   01/24/2020 Procedure   PAC placed 01/24/20   01/25/2020 -  Chemotherapy   first line FOLFOX starting 01/25/2020, plan to add Panitumumab with C2 or C3     CURRENT THERAPY: first line FOLFOX starting 01/25/2020, vectibix added with C2 (02/06/20)  INTERVAL HISTORY: Ms. FDoffingreturns for follow up and treatment as scheduled. She completed cycle 3 FOLFOX/vectibix on 02/21/20. She is doing well,  no major changes. Her skin rash is  itching and feels like "knots." she applies clindagel and hydrocortisone. Hands are dark with few small cracks on thumbs. Denies neuropathy. Cold sensitivity lasts 2 weeks but improves over time. Mild nausea and constipation are managed with meds and prunes, respectively. She has mild intermittent abdominal discomfort that has improved on chemo. Energy and appetite adequate. Denies fever, chills, cough, chest pain, dyspnea, or new concerns.    MEDICAL HISTORY:  Past Medical History:  Diagnosis Date  . Arthritis   . Diabetes mellitus without complication ([place])   . Hypertension     SURGICAL HISTORY: Past Surgical History:  Procedure Laterality Date  . ABDOMINAL HYSTERECTOMY    . ANTERIOR AND POSTERIOR REPAIR WITH SACROSPINOUS FIXATION N/A 07/16/2016   Procedure: ANTERIOR AND POSTERIOR REPAIR WITH SACROSPINOUS FIXATION;  Surgeon: Everlene Farrier, MD;  Location: [facility];  Service: Gynecology;  Laterality: N/A;  . CYSTOSCOPY  07/16/2016   Procedure: CYSTOSCOPY;  Surgeon: Everlene Farrier, MD;  Location: [facility];  Service: Gynecology;;  . ESOPHAGOGASTRODUODENOSCOPY (EGD) WITH PROPOFOL N/A 12/22/2019   Procedure: ESOPHAGOGASTRODUODENOSCOPY (EGD) WITH PROPOFOL;  Surgeon: [name], MD;  Location: [facility];  Service: Endoscopy;  Laterality: N/A;  . FINE NEEDLE ASPIRATION N/A 12/22/2019   Procedure: FINE NEEDLE ASPIRATION (FNA) LINEAR;  Surgeon: [name], MD;  Location: [facility];  Service: Endoscopy;  Laterality: N/A;  . IR IMAGING GUIDED PORT INSERTION  01/24/2020  . LAPAROSCOPIC VAGINAL HYSTERECTOMY WITH SALPINGECTOMY Bilateral 07/16/2016   Procedure: LAPAROSCOPIC ASSISTED VAGINAL HYSTERECTOMY WITH SALPINGECTOMY;  Surgeon: Everlene Farrier, MD;  Location: [facility];  Service: Gynecology;  Laterality: Bilateral;  . MYOMECTOMY ABDOMINAL APPROACH    . THYROID SURGERY    . tyroid    . UPPER ESOPHAGEAL ENDOSCOPIC ULTRASOUND (EUS) N/A 12/22/2019   Procedure: UPPER ESOPHAGEAL ENDOSCOPIC ULTRASOUND  (EUS);  Surgeon: [name], MD;  Location: [facility];  Service: Endoscopy;  Laterality: N/A;    I have reviewed the social history and family history with the patient and they are unchanged from previous note.  ALLERGIES:  has No Known Allergies.  MEDICATIONS:  Current Outpatient Medications  Medication Sig Dispense Refill  . doxycycline (VIBRA-TABS) 100 MG tablet Take 1 tablet (100 mg total) by mouth 2 (two) times daily. 60 tablet 1  . magnesium oxide (MAG-OX) 400 (241.3 Mg) MG tablet Take 1 tablet (400 mg total) by mouth 2 (two) times daily. 60 tablet 1  . aspirin EC 81 MG tablet Take 1 tablet (81 mg total) by mouth daily. 90 tablet 3  . atorvastatin (LIPITOR) 20 MG tablet Take 20 mg by mouth at bedtime.   1  . clindamycin (CLINDAGEL) 1 % gel Apply topically 2 (two) times daily. 60 g 2  . clobetasol cream (TEMOVATE) 3.41 % Apply 1 application topically daily.    Marland Kitchen esomeprazole (NEXIUM) 40 MG capsule Take 40 mg by mouth daily.    . ferrous sulfate 325 (65 FE) MG tablet Take 1 tablet by mouth daily.    Marland Kitchen lidocaine-prilocaine (EMLA) cream Apply 1 application topically as needed. 30 g 1  . lisinopril-hydrochlorothiazide (ZESTORETIC) 20-12.5 MG tablet Take 1 tablet by mouth daily with breakfast.     . ondansetron (ZOFRAN) 8 MG tablet Take 1 tablet (8 mg total) by mouth every 8 (eight) hours as needed for nausea or vomiting. (Patient not taking: Reported on 02/06/2020) 20 tablet 1  . prochlorperazine (COMPAZINE) 10 MG tablet Take 1 tablet (10 mg total) by mouth every 6 (six) hours  as needed for nausea or vomiting. 30 tablet 0  . spironolactone (ALDACTONE) 25 MG tablet Take 25 mg by mouth daily with breakfast.  (Patient not taking: Reported on 01/22/2020)    . traMADol (ULTRAM) 50 MG tablet Take 1 tablet (50 mg total) by mouth every 6 (six) hours as needed. 15 tablet 0   No current facility-administered medications for this visit.   Facility-Administered Medications Ordered in Other  Visits  Medication Dose Route Frequency Provider Last Rate Last Admin  . 0.9 %  sodium chloride infusion   Intravenous Once [name], MD      . dextrose 5 % solution   Intravenous Once [name], MD      . fluorouracil (ADRUCIL) 4,750 mg in sodium chloride 0.9 % 55 mL chemo infusion  2,400 mg/m2 (Treatment Plan Recorded) Intravenous 1 day or 1 dose [name], MD      . leucovorin 792 mg in dextrose 5 % 250 mL infusion  400 mg/m2 (Treatment Plan Recorded) Intravenous Once [name], MD      . oxaliplatin (ELOXATIN) 170 mg in dextrose 5 % 500 mL chemo infusion  85 mg/m2 (Treatment Plan Recorded) Intravenous Once [name], MD      . panitumumab (VECTIBIX) 500 mg in sodium chloride 0.9 % 100 mL chemo infusion  6 mg/kg (Treatment Plan Recorded) Intravenous Once [name], MD        PHYSICAL EXAMINATION: ECOG PERFORMANCE STATUS: 1 - Symptomatic but completely ambulatory  Vitals:   03/05/20 1038  BP: (!) 151/87  Pulse: 80  Resp: 17  Temp: (!) 97 F (36.1 C)  SpO2: 100%   Filed Weights   03/05/20 1038  Weight: 181 lb 3.2 oz (82.2 kg)    GENERAL:alert, no distress and comfortable SKIN: skin color, texture, turgor are normal, no rashes or significant lesions EYES: normal, Conjunctiva are pink and non-injected, sclera clear OROPHARYNX:no exudate, no erythema and lips, buccal mucosa, and tongue normal  NECK: supple, thyroid normal size, non-tender, without nodularity LYMPH:  no palpable lymphadenopathy in the cervical, axillary or inguinal LUNGS: clear to auscultation and percussion with normal breathing effort HEART: regular rate & rhythm and no murmurs and no lower extremity edema ABDOMEN:abdomen soft, non-tender and normal bowel sounds Musculoskeletal:no cyanosis of digits and no clubbing  NEURO: alert & oriented x 3 with fluent speech, no focal motor/sensory deficits  LABORATORY DATA:  I have reviewed the data as listed CBC Latest Ref Rng & Units 03/05/2020 02/21/2020 02/06/2020   WBC 4.0 - 10.5 K/uL 5.7 5.2 6.8  Hemoglobin 12.0 - 15.0 g/dL 13.0 12.7 12.2  Hematocrit 36.0 - 46.0 % 39.2 38.1 37.2  Platelets 150 - 400 K/uL 152 152 331     CMP Latest Ref Rng & Units 03/05/2020 02/21/2020 02/06/2020  Glucose 70 - 99 mg/dL 127(H) 129(H) 111(H)  BUN 8 - 23 mg/dL 16 10 11   Creatinine 0.44 - 1.00 mg/dL 0.72 0.66 0.77  Sodium 135 - 145 mmol/L 138 141 142  Potassium 3.5 - 5.1 mmol/L 3.7 3.7 3.4(L)  Chloride 98 - 111 mmol/L 103 105 100  CO2 22 - 32 mmol/L 27 31 30   Calcium 8.9 - 10.3 mg/dL 9.8 9.4 9.6  Total Protein 6.5 - 8.1 g/dL 7.7 7.9 7.5  Total Bilirubin 0.3 - 1.2 mg/dL 0.3 0.4 0.4  Alkaline Phos 38 - 126 U/L 96 110 140(H)  AST 15 - 41 U/L 24 25 17   ALT 0 - 44 U/L 24 29 19  RADIOGRAPHIC STUDIES: I have personally reviewed the radiological images as listed and agreed with the findings in the report. No results found.   ASSESSMENT & PLAN: Lindsay [name] a 64 y.o.femalewith   1. 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 MSS, [name]/[name] wildtype -Her 12/15/19 MRI abdomen showed 2 large liver masses indicating metastatic disease and bulkyporta hepatis and retroperitoneal adenopathy. -Her EUS from 12/22/19 showed 3.5cm LN in porta hepatis regionand biopsy confirmed metastatic adenocarcinoma.Preliminary cytology sample is not adequate for any additional IHC study for the origin of tumor, the morphology does not support HCC. -Her EGD was negative formalignancy. Her colonoscopy in March 2021 didshow a large 2 cm polyps in descending colon, biopsy showed high-grade dysplasia. -Liver biopsyconfirmed adenocarcinoma, immunostain showed positive CDX2 and CK20, negative CK7, supporting primary colorectalcancer  -PET scan showedknownliver metastasis, and diffuse thoracic and abdominaladenopathy.There isa hypermetabolic 1.8 cm pulmonary nodule in left upper lobe, and focal hypermetabolic lesion in the splenic fixture of the colon,concerning for  primary tumor. -Sheunderwent repeat colonoscopy and biopsy by Dr. Benson Norway, I have requested the report and path.  -additional IHC testing with TTF-1 shows focal staining, which can be seen in 5-10% of colorectal primary cancers as well as lung cancer. However given the above, the overall picture is still consistent with colorectal primary.  -She began first line systemic FOLFOX on 11/18, goal is palliative. Tolerating well  -FO shows [name]/[name] wild-type, she is a candidate for EGFR inhibitor Panitumumab which was added with C2 -plan to restage after C5   2. Chronic Lower abdominal pain, Nausea  -She has had chronic low abdominal pain for 5 years ranging up to 4 times a week. She notes her pain worsened in the last 3 years, but no work-up until 2021.  -Her pain is 5/10 at most but when high not managed on Motrin. -she has tramadol but does not take it much -encouraged her to use Ensure/boost if she has decreased appetite -f/up with dietician -abdominal pain improved on chemo, she is likely responding   3. HTN, DM, Heart Murmur, Arthritis  -PerPCP and Cardiologist Dr Einar Gip -DM controlled, no pre-existing neuropathy -We will monitor for elevated BG and neuropathy on chemo  4. 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  -She lives with husband and has brothers and sister in and out of town. Her only child passed in recent years.   5. Goals of care -we discussed the incurable but treatable nature of her stage IV cancer and general prognosis. She understands the prognosis is poor if she does not tolerate or respond well to chemo -she understands the goal is palliative  -code status not addressed today  Disposition:  Lindsay [name] appears stable. She completed cycle 3 FOLFOX and panitumumab (added from C2). She tolerates treatment well overall, with cold sensitivity, mild n/d, skin rash, and now mild hand/foot hyperpigmentation with cracking on the thumb. I  encouraged her to use heavy moisturizer. Will add doxycycline BID for skin rash and continue clindagel and hydrocortisone. Other side effects are well managed with supportive care at home. She is able to recover and function well.   Labs reviewed. CBC and CMP unremarkable. Mag 1.6, I recommend to add mag-ox BID and a prescription was sent. CEA improved after cycle 3. She is likely responding to treatment.   She will proceed with C4 FOLFOX/panitumumab today. She will return for f/up and cycle 5 in 2 weeks. Plan to restage after cycle 5.  Orders Placed This Encounter  Procedures  . CT CHEST ABDOMEN PELVIS W CONTRAST    Standing Status:   Future    Standing Expiration Date:   03/05/2021    Order Specific Question:   If indicated for the ordered procedure, I authorize the administration of contrast media per Radiology protocol    Answer:   Yes    Order Specific Question:   Preferred imaging location?    Answer[facility]    Order Specific Question:   Is Oral Contrast requested for this exam?    Answer:   Yes, Per Radiology protocol    Order Specific Question:   Reason for Exam (SYMPTOM  OR DIAGNOSIS REQUIRED)    Answer:   restage on chemo   All questions were answered. The patient knows to call the clinic with any problems, questions or concerns. No barriers to learning were detected.     Lindsay Feeling, NP 03/05/20

## 2020-03-06 LAB — CANCER ANTIGEN 19-9: CA 19-9: 34 U/mL (ref 0–35)

## 2020-03-07 ENCOUNTER — Other Ambulatory Visit: Payer: Self-pay

## 2020-03-07 ENCOUNTER — Inpatient Hospital Stay: Payer: Medicare Other

## 2020-03-07 DIAGNOSIS — Z5112 Encounter for antineoplastic immunotherapy: Secondary | ICD-10-CM | POA: Diagnosis not present

## 2020-03-07 DIAGNOSIS — Z95828 Presence of other vascular implants and grafts: Secondary | ICD-10-CM

## 2020-03-07 DIAGNOSIS — C221 Intrahepatic bile duct carcinoma: Secondary | ICD-10-CM

## 2020-03-07 MED ORDER — SODIUM CHLORIDE 0.9% FLUSH
10.0000 mL | Freq: Once | INTRAVENOUS | Status: AC
  Administered 2020-03-07: 14:00:00 10 mL
  Filled 2020-03-07: qty 10

## 2020-03-07 MED ORDER — HEPARIN SOD (PORK) LOCK FLUSH 100 UNIT/ML IV SOLN
500.0000 [IU] | Freq: Once | INTRAVENOUS | Status: AC
Start: 2020-03-07 — End: 2020-03-07
  Administered 2020-03-07: 14:00:00 500 [IU]
  Filled 2020-03-07: qty 5

## 2020-03-18 NOTE — Progress Notes (Deleted)
Lindsay [name]   [phone] Fax:([phone]) [phone]   Clinic Follow up Note   Patient Care Team: [name], MD as PCP - General (Internal Medicine) [name], RN as Oncology Nurse Navigator [name], MD as Consulting Physician (Oncology) [name], MD as Consulting Physician (Gastroenterology) 03/18/2020  CHIEF COMPLAINT: Follow-up colon cancer  SUMMARY OF ONCOLOGIC HISTORY: Oncology History Overview Note  Cancer Staging No matching staging information was found for the patient.    metastatic colon cancer to liver  06/20/2019 Procedure   Colonoscopy by Dr Lindsay [name] 06/20/19  IMPRESSION -Seven 3 to 10 mm polyps in the sigmoid colon, in the transverse colon and in the escending colon, removed with a cold snare. Resected and retrireved.  -One 32m polyp in the descending colon. Biopsies. Tattoes.  -Mediaum sized lipoma in the ascending colon.   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 See Comment B. Colon, Ascending, polyp, Polypectomy:  -TUBULAR ADENOMA -No high grade dysplasia or malignancy.  C. Colon, TRansverse, Polyo, polectomy:  -TUBULAR ADENOMA -No high grade dysplasia or malignancy.  D. Colon, Sigmoid, Polyp, Polypectomy:  -HYPERPLASTIC POLYP   11/07/2019 Imaging   [place]dom[place] 11/07/19  IMPRESSION: 1. Two solid masses in the liver are nonspecific. Recommend MRI abdomen with and without contrast for further evaluation.   12/15/2019 Imaging   MRI Abdomen 12/15/19  IMPRESSION: 1. There are two large masses in the liver with appearance favoring metastatic disease or hepatocellular carcinoma or cholangiocarcinoma. A benign etiology is highly unlikely given the enhancement pattern and associated adenopathy. 2. Considerable porta hepatis and retroperitoneal adenopathy. Some of the confluent porta hepatis tumor is potentially infiltrative and abuts the  pancreatic body along its right upper margin, making it difficult to completely exclude the possibility of pancreatic adenocarcinoma primary. Possibilities helpful in further workup might include tissue diagnosis, endoscopic ultrasound, or nuclear medicine PET-CT. 3. Pancreas divisum. 4. Lumbar spondylosis and degenerative disc disease. 5. Despite efforts by the technologist and patient, motion artifact is present on today's exam and could not be eliminated. This reduces exam sensitivity and specificity.   12/22/2019 Procedure   Upper Endoscopy by Dr [name]10/15/21  IMPRESSION - One lymph node was visualized and measured in the porta hepatis region. Fine needle aspiration performed    12/22/2019 Initial Biopsy   A. LIVER, PORTA HEPATIS MASS, FINE NEEDLE 12/22/19 ASPIRATION:  FINAL MICROSCOPIC DIAGNOSIS:  - Malignant cells consistent with metastatic adenocarcinoma   01/01/2020 Initial Diagnosis   Intrahepatic cholangiocarcinoma ([place]   01/08/2020 Initial Biopsy   FINAL MICROSCOPIC DIAGNOSIS:   A. LIVER, LEFT LOBE, BIOPSY:  - Metastatic adenocarcinoma, consistent with a colorectal primary.  See  comment      COMMENT:   Immunohistochemical stains show the tumor cells are positive for CK20  and CDX2 but negative for CK7, consistent with above interpretation.  Dr. 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   01/24/2020 Procedure   PAC placed 01/24/20   01/25/2020 -  Chemotherapy   first line FOLFOX starting 01/25/2020, plan to add Panitumumab with C2 or C3     CURRENT THERAPY: first line FOLFOX starting 01/25/2020, vectibix added with C2 (02/06/20)  INTERVAL HISTORY: Ms. FMamonereturns for follow-up and treatment as scheduled.  She completed 4 cycles FOLFOX vectibix on 03/05/2020.   REVIEW OF SYSTEMS:  Constitutional: Denies  fevers, chills or abnormal weight loss Eyes: Denies blurriness of vision Ears, nose, mouth, throat, and face: Denies mucositis or sore throat Respiratory: Denies cough, dyspnea or wheezes Cardiovascular: Denies palpitation, chest discomfort or lower extremity swelling Gastrointestinal:  Denies nausea, heartburn or change in bowel habits Skin: Denies abnormal skin rashes Lymphatics: Denies new lymphadenopathy or easy bruising Neurological:Denies numbness, tingling or new weaknesses Behavioral/Psych: Mood is stable, no new changes  All other systems were reviewed with the patient and are negative.  MEDICAL HISTORY:  Past Medical History:  Diagnosis Date  . Arthritis   . Diabetes mellitus without complication (Lindsay [name])   . Hypertension     SURGICAL HISTORY: Past Surgical History:  Procedure Laterality Date  . ABDOMINAL HYSTERECTOMY    . ANTERIOR AND POSTERIOR REPAIR WITH SACROSPINOUS FIXATION N/A 07/16/2016   Procedure: ANTERIOR AND POSTERIOR REPAIR WITH SACROSPINOUS FIXATION;  Surgeon: Lindsay Farrier, MD;  Location: [facility];  Service: Gynecology;  Laterality: N/A;  . CYSTOSCOPY  07/16/2016   Procedure: CYSTOSCOPY;  Surgeon: Lindsay Farrier, MD;  Location: [facility];  Service: Gynecology;;  . ESOPHAGOGASTRODUODENOSCOPY (EGD) WITH PROPOFOL N/A 12/22/2019   Procedure: ESOPHAGOGASTRODUODENOSCOPY (EGD) WITH PROPOFOL;  Surgeon: [name], MD;  Location: [facility];  Service: Endoscopy;  Laterality: N/A;  . FINE NEEDLE ASPIRATION N/A 12/22/2019   Procedure: FINE NEEDLE ASPIRATION (FNA) LINEAR;  Surgeon: [name], MD;  Location: [facility];  Service: Endoscopy;  Laterality: N/A;  . IR IMAGING GUIDED PORT INSERTION  01/24/2020  . LAPAROSCOPIC VAGINAL HYSTERECTOMY WITH SALPINGECTOMY Bilateral 07/16/2016   Procedure: LAPAROSCOPIC ASSISTED VAGINAL HYSTERECTOMY WITH SALPINGECTOMY;  Surgeon: Lindsay Farrier, MD;  Location: [facility];  Service: Gynecology;  Laterality: Bilateral;  . MYOMECTOMY  ABDOMINAL APPROACH    . THYROID SURGERY    . tyroid    . UPPER ESOPHAGEAL ENDOSCOPIC ULTRASOUND (EUS) N/A 12/22/2019   Procedure: UPPER ESOPHAGEAL ENDOSCOPIC ULTRASOUND (EUS);  Surgeon: [name], MD;  Location: [facility];  Service: Endoscopy;  Laterality: N/A;    I have reviewed the social history and family history with the patient and they are unchanged from previous note.  ALLERGIES:  has No Known Allergies.  MEDICATIONS:  Current Outpatient Medications  Medication Sig Dispense Refill  . aspirin EC 81 MG tablet Take 1 tablet (81 mg total) by mouth daily. 90 tablet 3  . atorvastatin (LIPITOR) 20 MG tablet Take 20 mg by mouth at bedtime.   1  . clindamycin (CLINDAGEL) 1 % gel Apply topically 2 (two) times daily. 60 g 2  . clobetasol cream (TEMOVATE) 1.88 % Apply 1 application topically daily.    Marland Kitchen doxycycline (VIBRA-TABS) 100 MG tablet Take 1 tablet (100 mg total) by mouth 2 (two) times daily. 60 tablet 1  . esomeprazole (NEXIUM) 40 MG capsule Take 40 mg by mouth daily.    . ferrous sulfate 325 (65 FE) MG tablet Take 1 tablet by mouth daily.    Marland Kitchen lidocaine-prilocaine (EMLA) cream Apply 1 application topically as needed. 30 g 1  . lisinopril-hydrochlorothiazide (ZESTORETIC) 20-12.5 MG tablet Take 1 tablet by mouth daily with breakfast.     . magnesium oxide (MAG-OX) 400 (241.3 Mg) MG tablet Take 1 tablet (400 mg total) by mouth 2 (two) times daily. 60 tablet 1  . ondansetron (ZOFRAN) 8 MG tablet Take 1 tablet (8 mg total) by mouth every 8 (eight) hours as needed for nausea or vomiting. (Patient not taking: Reported on 02/06/2020) 20 tablet 1  . prochlorperazine (COMPAZINE) 10 MG tablet Take  1 tablet (10 mg total) by mouth every 6 (six) hours as needed for nausea or vomiting. 30 tablet 0  . spironolactone (ALDACTONE) 25 MG tablet Take 25 mg by mouth daily with breakfast.  (Patient not taking: Reported on 01/22/2020)    . traMADol (ULTRAM) 50 MG tablet Take 1 tablet (50 mg total) by  mouth every 6 (six) hours as needed. 15 tablet 0   No current facility-administered medications for this visit.    PHYSICAL EXAMINATION: ECOG PERFORMANCE STATUS: {CHL ONC ECOG PS:[id number]}  There were no vitals filed for this visit. There were no vitals filed for this visit.  GENERAL:alert, no distress and comfortable SKIN: skin color, texture, turgor are normal, no rashes or significant lesions EYES: normal, Conjunctiva are pink and non-injected, sclera clear OROPHARYNX:no exudate, no erythema and lips, buccal mucosa, and tongue normal  NECK: supple, thyroid normal size, non-tender, without nodularity LYMPH:  no palpable lymphadenopathy in the cervical, axillary or inguinal LUNGS: clear to auscultation and percussion with normal breathing effort HEART: regular rate & rhythm and no murmurs and no lower extremity edema ABDOMEN:abdomen soft, non-tender and normal bowel sounds Musculoskeletal:no cyanosis of digits and no clubbing  NEURO: alert & oriented x 3 with fluent speech, no focal motor/sensory deficits  LABORATORY DATA:  I have reviewed the data as listed CBC Latest Ref Rng & Units 03/05/2020 02/21/2020 02/06/2020  WBC 4.0 - 10.5 K/uL 5.7 5.2 6.8  Hemoglobin 12.0 - 15.0 g/dL 13.0 12.7 12.2  Hematocrit 36.0 - 46.0 % 39.2 38.1 37.2  Platelets 150 - 400 K/uL 152 152 331     CMP Latest Ref Rng & Units 03/05/2020 02/21/2020 02/06/2020  Glucose 70 - 99 mg/dL 127(H) 129(H) 111(H)  BUN 8 - 23 mg/dL 16 10 11   Creatinine 0.44 - 1.00 mg/dL 0.72 0.66 0.77  Sodium 135 - 145 mmol/L 138 141 142  Potassium 3.5 - 5.1 mmol/L 3.7 3.7 3.4(L)  Chloride 98 - 111 mmol/L 103 105 100  CO2 22 - 32 mmol/L 27 31 30   Calcium 8.9 - 10.3 mg/dL 9.8 9.4 9.6  Total Protein 6.5 - 8.1 g/dL 7.7 7.9 7.5  Total Bilirubin 0.3 - 1.2 mg/dL 0.3 0.4 0.4  Alkaline Phos 38 - 126 U/L 96 110 140(H)  AST 15 - 41 U/L 24 25 17   ALT 0 - 44 U/L 24 29 19       RADIOGRAPHIC STUDIES: I have personally reviewed the  radiological images as listed and agreed with the findings in the report. No results found.   ASSESSMENT & PLAN:  No problem-specific Assessment & Plan notes found for this encounter.   No orders of the defined types were placed in this encounter.  All questions were answered. The patient knows to call the clinic with any problems, questions or concerns. No barriers to learning was detected. I spent {CHL ONC TIME VISIT - [id number]} counseling the patient face to face. The total time spent in the appointment was {CHL ONC TIME VISIT - KNLZJ:[id number]} and more than 50% was on counseling and review of test results     Alla Feeling, NP 03/18/20

## 2020-03-19 ENCOUNTER — Other Ambulatory Visit: Payer: Self-pay

## 2020-03-19 ENCOUNTER — Inpatient Hospital Stay (HOSPITAL_BASED_OUTPATIENT_CLINIC_OR_DEPARTMENT_OTHER): Payer: Medicare Other | Admitting: Hematology

## 2020-03-19 ENCOUNTER — Inpatient Hospital Stay: Payer: Medicare Other

## 2020-03-19 ENCOUNTER — Encounter: Payer: Self-pay | Admitting: Hematology

## 2020-03-19 ENCOUNTER — Telehealth: Payer: Self-pay | Admitting: Hematology

## 2020-03-19 ENCOUNTER — Inpatient Hospital Stay: Payer: Medicare Other | Attending: Hematology

## 2020-03-19 VITALS — BP 150/80 | HR 75 | Temp 96.7°F | Resp 17 | Ht 66.0 in | Wt 182.4 lb

## 2020-03-19 DIAGNOSIS — C221 Intrahepatic bile duct carcinoma: Secondary | ICD-10-CM

## 2020-03-19 DIAGNOSIS — R011 Cardiac murmur, unspecified: Secondary | ICD-10-CM | POA: Insufficient documentation

## 2020-03-19 DIAGNOSIS — E119 Type 2 diabetes mellitus without complications: Secondary | ICD-10-CM | POA: Insufficient documentation

## 2020-03-19 DIAGNOSIS — Z5112 Encounter for antineoplastic immunotherapy: Secondary | ICD-10-CM | POA: Insufficient documentation

## 2020-03-19 DIAGNOSIS — C778 Secondary and unspecified malignant neoplasm of lymph nodes of multiple regions: Secondary | ICD-10-CM | POA: Diagnosis not present

## 2020-03-19 DIAGNOSIS — C801 Malignant (primary) neoplasm, unspecified: Secondary | ICD-10-CM | POA: Insufficient documentation

## 2020-03-19 DIAGNOSIS — Z7984 Long term (current) use of oral hypoglycemic drugs: Secondary | ICD-10-CM | POA: Diagnosis not present

## 2020-03-19 DIAGNOSIS — C787 Secondary malignant neoplasm of liver and intrahepatic bile duct: Secondary | ICD-10-CM | POA: Diagnosis present

## 2020-03-19 DIAGNOSIS — C19 Malignant neoplasm of rectosigmoid junction: Secondary | ICD-10-CM | POA: Insufficient documentation

## 2020-03-19 DIAGNOSIS — Z86010 Personal history of colonic polyps: Secondary | ICD-10-CM | POA: Diagnosis not present

## 2020-03-19 DIAGNOSIS — Z87891 Personal history of nicotine dependence: Secondary | ICD-10-CM | POA: Diagnosis not present

## 2020-03-19 DIAGNOSIS — I10 Essential (primary) hypertension: Secondary | ICD-10-CM | POA: Insufficient documentation

## 2020-03-19 DIAGNOSIS — Z95828 Presence of other vascular implants and grafts: Secondary | ICD-10-CM

## 2020-03-19 DIAGNOSIS — Z7189 Other specified counseling: Secondary | ICD-10-CM

## 2020-03-19 DIAGNOSIS — Z5111 Encounter for antineoplastic chemotherapy: Secondary | ICD-10-CM | POA: Insufficient documentation

## 2020-03-19 LAB — CBC WITH DIFFERENTIAL (CANCER CENTER ONLY)
Abs Immature Granulocytes: 0.02 10*3/uL (ref 0.00–0.07)
Basophils Absolute: 0 10*3/uL (ref 0.0–0.1)
Basophils Relative: 1 %
Eosinophils Absolute: 0.1 10*3/uL (ref 0.0–0.5)
Eosinophils Relative: 2 %
HCT: 38.6 % (ref 36.0–46.0)
Hemoglobin: 13.2 g/dL (ref 12.0–15.0)
Immature Granulocytes: 0 %
Lymphocytes Relative: 19 %
Lymphs Abs: 0.9 10*3/uL (ref 0.7–4.0)
MCH: 30 pg (ref 26.0–34.0)
MCHC: 34.2 g/dL (ref 30.0–36.0)
MCV: 87.7 fL (ref 80.0–100.0)
Monocytes Absolute: 0.7 10*3/uL (ref 0.1–1.0)
Monocytes Relative: 14 %
Neutro Abs: 3.1 10*3/uL (ref 1.7–7.7)
Neutrophils Relative %: 64 %
Platelet Count: 167 10*3/uL (ref 150–400)
RBC: 4.4 MIL/uL (ref 3.87–5.11)
RDW: 15.5 % (ref 11.5–15.5)
WBC Count: 4.9 10*3/uL (ref 4.0–10.5)
nRBC: 0 % (ref 0.0–0.2)

## 2020-03-19 LAB — CMP (CANCER CENTER ONLY)
ALT: 22 U/L (ref 0–44)
AST: 21 U/L (ref 15–41)
Albumin: 3.8 g/dL (ref 3.5–5.0)
Alkaline Phosphatase: 114 U/L (ref 38–126)
Anion gap: 9 (ref 5–15)
BUN: 11 mg/dL (ref 8–23)
CO2: 28 mmol/L (ref 22–32)
Calcium: 9.8 mg/dL (ref 8.9–10.3)
Chloride: 101 mmol/L (ref 98–111)
Creatinine: 0.69 mg/dL (ref 0.44–1.00)
GFR, Estimated: >60 mL/min (ref >60)
Glucose, Bld: 126 mg/dL — ABNORMAL HIGH (ref 70–99)
Potassium: 3.6 mmol/L (ref 3.5–5.1)
Sodium: 138 mmol/L (ref 135–145)
Total Bilirubin: 0.3 mg/dL (ref 0.3–1.2)
Total Protein: 7.8 g/dL (ref 6.5–8.1)

## 2020-03-19 LAB — MAGNESIUM: Magnesium: 1.5 mg/dL — ABNORMAL LOW (ref 1.7–2.4)

## 2020-03-19 LAB — CEA (IN HOUSE-CHCC): CEA (CHCC-In House): 22.78 ng/mL — ABNORMAL HIGH (ref 0.00–5.00)

## 2020-03-19 MED ORDER — DEXTROSE 5 % IV SOLN
Freq: Once | INTRAVENOUS | Status: AC
  Filled 2020-03-19: qty 250

## 2020-03-19 MED ORDER — SODIUM CHLORIDE 0.9% FLUSH
10.0000 mL | Freq: Once | INTRAVENOUS | Status: AC
  Administered 2020-03-19: 10 mL
  Filled 2020-03-19: qty 10

## 2020-03-19 MED ORDER — MAGNESIUM SULFATE 2 GM/50ML IV SOLN
2.0000 g | Freq: Once | INTRAVENOUS | Status: AC
  Administered 2020-03-19: 2 g via INTRAVENOUS

## 2020-03-19 MED ORDER — PALONOSETRON HCL INJECTION 0.25 MG/5ML
INTRAVENOUS | Status: AC
  Filled 2020-03-19: qty 5

## 2020-03-19 MED ORDER — LEUCOVORIN CALCIUM INJECTION 350 MG
400.0000 mg/m2 | Freq: Once | INTRAVENOUS | Status: AC
  Administered 2020-03-19: 792 mg via INTRAVENOUS
  Filled 2020-03-19: qty 39.6

## 2020-03-19 MED ORDER — SODIUM CHLORIDE 0.9% FLUSH
10.0000 mL | INTRAVENOUS | Status: DC | PRN
  Filled 2020-03-19: qty 10

## 2020-03-19 MED ORDER — SODIUM CHLORIDE 0.9 % IV SOLN
2000.0000 mg/m2 | INTRAVENOUS | Status: DC
  Administered 2020-03-19: 3950 mg via INTRAVENOUS
  Filled 2020-03-19: qty 79

## 2020-03-19 MED ORDER — PALONOSETRON HCL INJECTION 0.25 MG/5ML
0.2500 mg | Freq: Once | INTRAVENOUS | Status: AC
  Administered 2020-03-19: 0.25 mg via INTRAVENOUS

## 2020-03-19 MED ORDER — HEPARIN SOD (PORK) LOCK FLUSH 100 UNIT/ML IV SOLN
500.0000 [IU] | Freq: Once | INTRAVENOUS | Status: DC | PRN
  Filled 2020-03-19: qty 5

## 2020-03-19 MED ORDER — SODIUM CHLORIDE 0.9 % IV SOLN
6.0000 mg/kg | Freq: Once | INTRAVENOUS | Status: AC
  Administered 2020-03-19: 500 mg via INTRAVENOUS
  Filled 2020-03-19: qty 20

## 2020-03-19 MED ORDER — DEXTROSE 5 % IV SOLN
85.0000 mg/m2 | Freq: Once | INTRAVENOUS | Status: AC
  Administered 2020-03-19: 170 mg via INTRAVENOUS
  Filled 2020-03-19: qty 34

## 2020-03-19 MED ORDER — MAGNESIUM SULFATE 2 GM/50ML IV SOLN
INTRAVENOUS | Status: AC
  Filled 2020-03-19: qty 50

## 2020-03-19 MED ORDER — SODIUM CHLORIDE 0.9 % IV SOLN
Freq: Once | INTRAVENOUS | Status: AC
  Filled 2020-03-19: qty 250

## 2020-03-19 MED ORDER — SODIUM CHLORIDE 0.9 % IV SOLN
10.0000 mg | Freq: Once | INTRAVENOUS | Status: AC
  Administered 2020-03-19: 10 mg via INTRAVENOUS
  Filled 2020-03-19: qty 10

## 2020-03-19 NOTE — Progress Notes (Signed)
[place]   [phone] [phone]   Clinic Follow up Note   Patient Care Team: [name], MD as PCP - General (Internal Medicine) [name], RN as Oncology Nurse Navigator [name], MD as Consulting Physician (Oncology) [name], MD as Consulting Physician (Gastroenterology)  Date of Service:  03/19/2020  CHIEF COMPLAINT: Follow-up metastatic colon cancer  SUMMARY OF ONCOLOGIC HISTORY: Oncology History Overview Note  Cancer Staging No matching staging information was found for the patient.    metastatic colon cancer to liver  06/20/2019 Procedure   Colonoscopy by Dr [name] 06/20/19  IMPRESSION -Seven 3 to 10 mm polyps in the sigmoid colon, in the transverse colon and in the escending colon, removed with a cold snare. Resected and retrireved.  -One 28m polyp in the descending colon. Biopsies. Tattoes.  -Mediaum sized lipoma in the ascending colon.   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 See Comment B. Colon, Ascending, polyp, Polypectomy:  -TUBULAR ADENOMA -No high grade dysplasia or malignancy.  C. Colon, TRansverse, Polyo, polectomy:  -TUBULAR ADENOMA -No high grade dysplasia or malignancy.  D. Colon, Sigmoid, Polyp, Polypectomy:  -HYPERPLASTIC POLYP   11/07/2019 Imaging   [place]domen 11/07/19  IMPRESSION: 1. Two solid masses in the liver are nonspecific. Recommend MRI abdomen with and without contrast for further evaluation.   12/15/2019 Imaging   MRI Abdomen 12/15/19  IMPRESSION: 1. There are two large masses in the liver with appearance favoring metastatic disease or hepatocellular carcinoma or cholangiocarcinoma. A benign etiology is highly unlikely given the enhancement pattern and associated adenopathy. 2. Considerable porta hepatis and retroperitoneal adenopathy. Some of the confluent porta hepatis tumor is  potentially infiltrative and abuts the pancreatic body along its right upper margin, making it difficult to completely exclude the possibility of pancreatic adenocarcinoma primary. Possibilities helpful in further workup might include tissue diagnosis, endoscopic ultrasound, or nuclear medicine PET-CT. 3. Pancreas divisum. 4. Lumbar spondylosis and degenerative disc disease. 5. Despite efforts by the technologist and patient, motion artifact is present on today's exam and could not be eliminated. This reduces exam sensitivity and specificity.   12/22/2019 Procedure   Upper Endoscopy by Dr [name]10/15/21  IMPRESSION - One lymph node was visualized and measured in the porta hepatis region. Fine needle aspiration performed    12/22/2019 Initial Biopsy   A. LIVER, PORTA HEPATIS MASS, FINE NEEDLE 12/22/19 ASPIRATION:  FINAL MICROSCOPIC DIAGNOSIS:  - Malignant cells consistent with metastatic adenocarcinoma   01/01/2020 Initial Diagnosis   Intrahepatic cholangiocarcinoma ([facility]   01/08/2020 Initial Biopsy   FINAL MICROSCOPIC DIAGNOSIS:   A. LIVER, LEFT LOBE, BIOPSY:  - Metastatic adenocarcinoma, consistent with a colorectal primary.  See  comment      COMMENT:   Immunohistochemical stains show the tumor cells are positive for CK20  and CDX2 but negative for CK7, consistent with above interpretation.  Dr. [name] notified on 01/10/2020   01/08/2020 Genetic Testing   [name]  [name] wildtype and [name]/[name] mutations which make her eligible for target biological agent Vectibix.   01/24/2020 Procedure   PAC placed 01/24/20   01/25/2020 -  Chemotherapy   first line FOLFOX starting 01/25/2020, Vextibix  added with C2 (02/06/20)      CURRENT THERAPY:  first line FOLFOX starting 01/25/2020, Vextibix  added with C2 (02/06/20)  INTERVAL HISTORY:  Lindsay BRUSTERis here for a follow up. She presents to the clinic alone.  She notes she is doing well. She notes the skin of her  fingers are dry and splitting. She notes her nail will split as well. She notes she denies neuropathy but has cold sensitivity. I reviewed her medication list with her. She is not taking aspirin. She notes acne skin rash on face from vectibix. She denies diarrhea, but leans towards constipation. She notes miralax does not help as much. She has used suppository.     REVIEW OF SYSTEMS:   Constitutional: Denies fevers, chills or abnormal weight loss Eyes: Denies blurriness of vision Ears, nose, mouth, throat, and face: Denies mucositis or sore throat Respiratory: Denies cough, dyspnea or wheezes Cardiovascular: Denies palpitation, chest discomfort or lower extremity swelling Gastrointestinal:  Denies nausea, heartburn (+) constipation Skin: (+) Acne skin rash on fave (+) Skin darkening, dry skin and splitting of fingers/nails Lymphatics: Denies new lymphadenopathy or easy bruising Neurological:Denies numbness, tingling or new weaknesses Behavioral/Psych: Mood is stable, no new changes  All other systems were reviewed with the patient and are negative.  MEDICAL HISTORY:  Past Medical History:  Diagnosis Date  . Arthritis   . Diabetes mellitus without complication ([place])   . Hypertension     SURGICAL HISTORY: Past Surgical History:  Procedure Laterality Date  . ABDOMINAL HYSTERECTOMY    . ANTERIOR AND POSTERIOR REPAIR WITH SACROSPINOUS FIXATION N/A 07/16/2016   Procedure: ANTERIOR AND POSTERIOR REPAIR WITH SACROSPINOUS FIXATION;  Surgeon: Everlene Farrier, MD;  Location: [facility];  Service: Gynecology;  Laterality: N/A;  . CYSTOSCOPY  07/16/2016   Procedure: CYSTOSCOPY;  Surgeon: Everlene Farrier, MD;  Location: [facility];  Service: Gynecology;;  . ESOPHAGOGASTRODUODENOSCOPY (EGD) WITH PROPOFOL N/A 12/22/2019   Procedure: ESOPHAGOGASTRODUODENOSCOPY (EGD) WITH PROPOFOL;  Surgeon: [name], MD;  Location: [facility];  Service: Endoscopy;  Laterality: N/A;  . FINE NEEDLE ASPIRATION N/A  12/22/2019   Procedure: FINE NEEDLE ASPIRATION (FNA) LINEAR;  Surgeon: [name], MD;  Location: [facility];  Service: Endoscopy;  Laterality: N/A;  . IR IMAGING GUIDED PORT INSERTION  01/24/2020  . LAPAROSCOPIC VAGINAL HYSTERECTOMY WITH SALPINGECTOMY Bilateral 07/16/2016   Procedure: LAPAROSCOPIC ASSISTED VAGINAL HYSTERECTOMY WITH SALPINGECTOMY;  Surgeon: Everlene Farrier, MD;  Location: [facility];  Service: Gynecology;  Laterality: Bilateral;  . MYOMECTOMY ABDOMINAL APPROACH    . THYROID SURGERY    . tyroid    . UPPER ESOPHAGEAL ENDOSCOPIC ULTRASOUND (EUS) N/A 12/22/2019   Procedure: UPPER ESOPHAGEAL ENDOSCOPIC ULTRASOUND (EUS);  Surgeon: [name], MD;  Location: [facility];  Service: Endoscopy;  Laterality: N/A;    I have reviewed the social history and family history with the patient and they are unchanged from previous note.  ALLERGIES:  has No Known Allergies.  MEDICATIONS:  Current Outpatient Medications  Medication Sig Dispense Refill  . aspirin EC 81 MG tablet Take 1 tablet (81 mg total) by mouth daily. 90 tablet 3  . atorvastatin (LIPITOR) 20 MG tablet Take 20 mg by mouth at bedtime.   1  . clindamycin (CLINDAGEL) 1 % gel Apply topically 2 (two) times daily. 60 g 2  . clobetasol cream (TEMOVATE) 9.24 % Apply 1 application topically daily.    Marland Kitchen doxycycline (VIBRA-TABS) 100 MG tablet Take 1 tablet (100 mg total) by mouth 2 (two) times daily. 60 tablet 1  . esomeprazole (NEXIUM) 40 MG capsule Take 40 mg by mouth daily.    . ferrous sulfate 325 (65 FE) MG tablet Take 1 tablet by mouth daily.    Marland Kitchen lidocaine-prilocaine (EMLA) cream Apply 1 application  topically as needed. 30 g 1  . lisinopril-hydrochlorothiazide (ZESTORETIC) 20-12.5 MG tablet Take 1 tablet by mouth daily with breakfast.     . magnesium oxide (MAG-OX) 400 (241.3 Mg) MG tablet Take 1 tablet (400 mg total) by mouth 2 (two) times daily. 60 tablet 1  . ondansetron (ZOFRAN) 8 MG tablet Take 1 tablet (8 mg total) by  mouth every 8 (eight) hours as needed for nausea or vomiting. (Patient not taking: Reported on 02/06/2020) 20 tablet 1  . prochlorperazine (COMPAZINE) 10 MG tablet Take 1 tablet (10 mg total) by mouth every 6 (six) hours as needed for nausea or vomiting. 30 tablet 0  . spironolactone (ALDACTONE) 25 MG tablet Take 25 mg by mouth daily with breakfast.  (Patient not taking: Reported on 01/22/2020)    . traMADol (ULTRAM) 50 MG tablet Take 1 tablet (50 mg total) by mouth every 6 (six) hours as needed. 15 tablet 0   No current facility-administered medications for this visit.    PHYSICAL EXAMINATION: ECOG PERFORMANCE STATUS: 1 - Symptomatic but completely ambulatory  Vitals:   03/19/20 0918  BP: (!) 150/80  Pulse: 75  Resp: 17  Temp: (!) 96.7 F (35.9 C)  SpO2: 100%   Filed Weights   03/19/20 0918  Weight: 182 lb 6.4 oz (82.7 kg)    Due to COVID19 we will limit examination to appearance. Patient had no complaints.  GENERAL:alert, no distress and comfortable SKIN: skin color normal, no rashes or significant lesions EYES: normal, Conjunctiva are pink and non-injected, sclera clear  NEURO: alert & oriented x 3 with fluent speech   LABORATORY DATA:  I have reviewed the data as listed CBC Latest Ref Rng & Units 03/19/2020 03/05/2020 02/21/2020  WBC 4.0 - 10.5 K/uL 4.9 5.7 5.2  Hemoglobin 12.0 - 15.0 g/dL 13.2 13.0 12.7  Hematocrit 36.0 - 46.0 % 38.6 39.2 38.1  Platelets 150 - 400 K/uL 167 152 152     CMP Latest Ref Rng & Units 03/05/2020 02/21/2020 02/06/2020  Glucose 70 - 99 mg/dL 127(H) 129(H) 111(H)  BUN 8 - 23 mg/dL _0 Creatinine 0.44 - 1.00 mg/dL 0.72 0.66 0.77  Sodium 135 - 145 mmol/L 138 141 142  Potassium 3.5 - 5.1 mmol/L 3.7 3.7 3.4(L)  Chloride 98 - 111 mmol/L 103 105 100  CO2 22 - 32 mmol/L _1 Calcium 8.9 - 10.3 mg/dL 9.8 9.4 9.6  Total Protein 6.5 - 8.1 g/dL 7.7 7.9 7.5  Total Bilirubin 0.3 - 1.2 mg/dL 0.3 0.4 0.4  Alkaline Phos 38 - 126 U/L 96 110  140(H)  AST 15 - 41 U/L _2 ALT 0 - 44 U/L _3 RADIOGRAPHIC STUDIES: I have personally reviewed the radiological images as listed and agreed with the findings in the report. No results found.   ASSESSMENT & PLAN:  Lindsay [name] is a 65 y.o. female with    1. 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 -Her EUS from 12/22/19 showed 3.5cm LN in porta hepatis regionand biopsy confirmed metastatic adenocarcinoma.Her liver biopsy results confirmed adenocarcinoma, immunostain showed positive CDX2 and CK20, negative CK7, supporting primary colorectalcancer.  -Initial PET from 01/12/20 showedIt showed known liver metastasis, and diffuse adenopathy in thoracic and abdomen. There isa hypermetabolic 1.8 cm pulmonary nodule in left upper  lobe, and focal hypermetabolic lesion in the splenic fixture of the colon,concerning for primary tumor. -Sheunderwent repeat colonoscopy and biopsy by Dr. Benson Norway in 01/2020. The overall picture is still consistent with colorectal primary. -Although her cancer is not curable at this stage, it is still treatable. I started her on first-line FOLFOX q2weeks on 01/25/20. Vectibix was added with C2.  -S/p C4 she is tolerating treatment moderately well. She has increased skin toxicity with splitting of skin and nails. She has no neuropathy but still has cold sensitivity. She does have constipation and acne skin rash.  -Labs reviewed and adequate to proceed with C5 FOLFOX and Vectibix with 5FU dose reduction given toxicity.  -F/u in 2 weeks with CT CAP a few days before. Given her tumor marker has improved I suspect she is responding to treatment.   2. Symptom Management: Acne skin rash, Skin toxicity, Constipation  -S/p C4 she has increased skin toxicity form 5FU. Will reduce dose. I  encouraged her to keep her skin moisturized and clean.  -She has constipation more recently. She can continue suppositories and prune juice and I recommend Senakot S given miralax has not helped. -Her acne skin rash form Vectibix has been isolate to her face and moderate. She can continue clindamycin and  Hydrocortisone.  -She has been able to maintain eating and weight on chemo so far.   3. Chronic Lower abdominal pain, Nausea, secondary to #1 -She has had chronic low abdominal pain for 5 years ranging up to 4 times a week. She notes her pain worsened in the last 3 years, but no work-up until 2021.  -For pain she is now on Tramadol for severe pain. She can otherwise she Tylenol -For her intermittent nausea she has Zofran -I discussed to maintain weight she can continue Ensure with high protein/high calorie diet and remain active.Continue to f/u with dietician. Weight is stable.   4. HTN, DM, Heart Murmur, Arthritis  -On medications. Managed by her PCP and Cardiologist Dr Einar Gip  5. 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  -She lives with husband and has brothers and sister in and out of town. Her only child passed in recent years.  -She notes her husband is aware of her condition and treatment, but she keeps the information to a minimum as she feels he may not handle it well.    6. Goals of care -we discussed the incurable but treatable nature of her stage IV cancer and general prognosis. She understands the prognosis is poor if she does not tolerate or respond well to chemo -she understands the goal is palliative  -code status not addressed today   PLAN: -Labs reviewed and adequate to proceed with C5 FOLFOX and Vectibix today with 5FU dose reduction due to skin toxicity  -Lab, flush, f/u and FOLFOX and Vectibix in 2, 4, 6, 8 weeks  -CT CAP w contrast a few days before f/u in 2 weeks     No problem-specific Assessment & Plan notes found for  this encounter.   Orders Placed This Encounter  Procedures  . CT CHEST ABDOMEN PELVIS W CONTRAST    Standing Status:   Future    Standing Expiration Date:   03/19/2021    Order Specific Question:   If indicated for the ordered procedure, I authorize the administration of contrast media per Radiology protocol    Answer:   Yes    Order Specific Question:   Preferred imaging location?  Answer:   [facility]    Order Specific Question:   Release to patient    Answer:   Immediate    Order Specific Question:   Is Oral Contrast requested for this exam?    Answer:   Yes, Per Radiology protocol    Order Specific Question:   Reason for Exam (SYMPTOM  OR DIAGNOSIS REQUIRED)    Answer:   RESTAGING, EVALUATE RESPONSE TO CHEMO   All questions were answered. The patient knows to call the clinic with any problems, questions or concerns. No barriers to learning was detected. The total time spent in the appointment was 30 minutes.     [name], MD 03/19/2020   I, Joslyn Devon, am acting as scribe for [name], MD.   I have reviewed the above documentation for accuracy and completeness, and I agree with the above.

## 2020-03-19 NOTE — Progress Notes (Signed)
Pt is day 1 cycle 4 for vectibix, pt mg is 1.5 ok to treat per Burr Medico, MD

## 2020-03-19 NOTE — Telephone Encounter (Signed)
Scheduled appointments per 1/11 los. Spoke to patient who is aware of appointments dates and times. Gave patient calendar print out.  °

## 2020-03-21 ENCOUNTER — Inpatient Hospital Stay: Payer: Medicare Other

## 2020-03-21 ENCOUNTER — Other Ambulatory Visit: Payer: Self-pay

## 2020-03-21 ENCOUNTER — Telehealth: Payer: Self-pay

## 2020-03-21 VITALS — BP 95/62 | HR 75 | Resp 18

## 2020-03-21 DIAGNOSIS — Z5112 Encounter for antineoplastic immunotherapy: Secondary | ICD-10-CM | POA: Diagnosis not present

## 2020-03-21 DIAGNOSIS — C221 Intrahepatic bile duct carcinoma: Secondary | ICD-10-CM

## 2020-03-21 MED ORDER — SODIUM CHLORIDE 0.9% FLUSH
10.0000 mL | INTRAVENOUS | Status: DC | PRN
  Administered 2020-03-21: 10 mL
  Filled 2020-03-21: qty 10

## 2020-03-21 MED ORDER — HEPARIN SOD (PORK) LOCK FLUSH 100 UNIT/ML IV SOLN
500.0000 [IU] | Freq: Once | INTRAVENOUS | Status: AC | PRN
  Administered 2020-03-21: 500 [IU]
  Filled 2020-03-21: qty 5

## 2020-03-21 NOTE — Telephone Encounter (Signed)
-----   Message from Alla Feeling, NP sent at 03/20/2020  2:32 PM EST ----- Please let her know tumor marker continue to trend down on chemo, good indicator that she is responding. Thanks, Regan Rakers, NP

## 2020-03-21 NOTE — Telephone Encounter (Signed)
Made pt aware of cea results  

## 2020-04-01 ENCOUNTER — Ambulatory Visit (HOSPITAL_COMMUNITY)
Admission: RE | Admit: 2020-04-01 | Discharge: 2020-04-01 | Disposition: A | Discharge status: Home / Self Care | Payer: Medicare Other | Source: Ambulatory Visit | Attending: Hematology | Admitting: Hematology

## 2020-04-01 ENCOUNTER — Encounter (HOSPITAL_COMMUNITY): Payer: Self-pay

## 2020-04-01 ENCOUNTER — Other Ambulatory Visit: Payer: Self-pay

## 2020-04-01 DIAGNOSIS — C221 Intrahepatic bile duct carcinoma: Secondary | ICD-10-CM | POA: Insufficient documentation

## 2020-04-01 MED ORDER — IOHEXOL 300 MG/ML  SOLN
100.0000 mL | Freq: Once | INTRAMUSCULAR | Status: AC | PRN
  Administered 2020-04-01: 100 mL via INTRAVENOUS

## 2020-04-01 MED ORDER — IOHEXOL 9 MG/ML PO SOLN
ORAL | Status: AC
  Filled 2020-04-01: qty 1000

## 2020-04-01 MED ORDER — IOHEXOL 9 MG/ML PO SOLN
500.0000 mL | ORAL | Status: AC
Start: 2020-04-01 — End: 2020-04-01
  Administered 2020-04-01: 500 mL via ORAL

## 2020-04-01 NOTE — Progress Notes (Signed)
Lindsay [name]   [phone] Fax:([phone]   Clinic Follow up Note   Patient Care Team: Lindsay Panda, MD as PCP - General (Internal Medicine) Lindsay Finner, RN as Oncology Nurse Navigator Lindsay Merle, MD as Consulting Physician (Oncology) Lindsay Ada, MD as Consulting Physician (Gastroenterology)  Date of Service:  04/03/2020  CHIEF COMPLAINT: Follow-up metastatic colon cancer  SUMMARY OF ONCOLOGIC HISTORY: Oncology History Overview Note  Cancer Staging No matching staging information was found for the patient.    metastatic colon cancer to liver  06/20/2019 Procedure   Colonoscopy by Dr Lindsay [name] 06/20/19  IMPRESSION -Seven 3 to 10 mm polyps in the sigmoid colon, in the transverse colon and in the escending colon, removed with a cold snare. Resected and retrireved.  -One 54m polyp in the descending colon. Biopsies. Tattoes.  -Mediaum sized lipoma in the ascending colon.   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 See Comment B. Colon, Ascending, polyp, Polypectomy:  -TUBULAR ADENOMA -No high grade dysplasia or malignancy.  C. Colon, TRansverse, Polyo, polectomy:  -TUBULAR ADENOMA -No high grade dysplasia or malignancy.  D. Colon, Sigmoid, Polyp, Polypectomy:  -HYPERPLASTIC POLYP   11/07/2019 Imaging   [place]dom[place] 11/07/19  IMPRESSION: 1. Two solid masses in the liver are nonspecific. Recommend MRI abdomen with and without contrast for further evaluation.   12/15/2019 Imaging   MRI Abdomen 12/15/19  IMPRESSION: 1. There are two large masses in the liver with appearance favoring metastatic disease or hepatocellular carcinoma or cholangiocarcinoma. A benign etiology is highly unlikely given the enhancement pattern and associated adenopathy. 2. Considerable porta hepatis and retroperitoneal adenopathy. Some of the confluent porta hepatis tumor is  potentially infiltrative and abuts the pancreatic body along its right upper margin, making it difficult to completely exclude the possibility of pancreatic adenocarcinoma primary. Possibilities helpful in further workup might include tissue diagnosis, endoscopic ultrasound, or nuclear medicine PET-CT. 3. Pancreas divisum. 4. Lumbar spondylosis and degenerative disc disease. 5. Despite efforts by the technologist and patient, motion artifact is present on today's exam and could not be eliminated. This reduces exam sensitivity and specificity.   12/22/2019 Procedure   Upper Endoscopy by Dr Lindsay Norway10/15/21  IMPRESSION - One lymph node was visualized and measured in the porta hepatis region. Fine needle aspiration performed    12/22/2019 Initial Biopsy   A. LIVER, PORTA HEPATIS MASS, FINE NEEDLE 12/22/19 ASPIRATION:  FINAL MICROSCOPIC DIAGNOSIS:  - Malignant cells consistent with metastatic adenocarcinoma   01/01/2020 Initial Diagnosis   Intrahepatic cholangiocarcinoma (HRoyal Palm [name]   01/08/2020 Initial Biopsy   FINAL MICROSCOPIC DIAGNOSIS:   A. LIVER, LEFT LOBE, BIOPSY:  - Metastatic adenocarcinoma, consistent with a colorectal primary.  See  comment      COMMENT:   Immunohistochemical stains show the tumor cells are positive for CK20  and CDX2 but negative for CK7, consistent with above interpretation.  Lindsay [name] notified on 01/10/2020   01/08/2020 Genetic Testing   [name]  [name] wildtype and [name]/[name] mutations which make her eligible for target biological agent Vectibix.   01/24/2020 Procedure   PAC placed 01/24/20   01/25/2020 -  Chemotherapy   first line FOLFOX starting 01/25/2020, Vextibix  added with C2 (02/06/20)      CURRENT THERAPY:  first line FOLFOX starting 01/25/2020, Vextibixadded with C2 (02/06/20)  INTERVAL HISTORY:  Lindsay HAIREis here for a follow up. She presents to the clinic alone. She notes  she tolerated last cycle well overall. She  notes having intermittent and occasional neuropathy in her fingertips. She denies this in her feet. She notes she is eating adequately and able to gain weight. She notes mild mouth sores occasionally but continues to manage with magic mouth wash. She does note skin dryness with splitting skin. She notes her acne rash is mild and manageable on face. She notes still getting constipation on days of infusion but otherwise regular.    REVIEW OF SYSTEMS:   Constitutional: Denies fevers, chills or abnormal weight loss Eyes: Denies blurriness of vision Ears, nose, mouth, throat, and face: Denies mucositis or sore throat Respiratory: Denies cough, dyspnea or wheezes Cardiovascular: Denies palpitation, chest discomfort or lower extremity swelling Gastrointestinal:  Denies nausea, heartburn or change in bowel habits Skin: (+) Mild skin acne rash of face (+) Dry skin with cracking  Lymphatics: Denies new lymphadenopathy or easy bruising Neurological:Denies numbness, tingling or new weaknesses Behavioral/Psych: Mood is stable, no new changes  All other systems were reviewed with the patient and are negative.  MEDICAL HISTORY:  Past Medical History:  Diagnosis Date  . Arthritis   . Diabetes mellitus without complication ([place])   . Hypertension     SURGICAL HISTORY: Past Surgical History:  Procedure Laterality Date  . ABDOMINAL HYSTERECTOMY    . ANTERIOR AND POSTERIOR REPAIR WITH SACROSPINOUS FIXATION N/A 07/16/2016   Procedure: ANTERIOR AND POSTERIOR REPAIR WITH SACROSPINOUS FIXATION;  Surgeon: Lindsay Farrier, MD;  Location: [facility];  Service: Gynecology;  Laterality: N/A;  . CYSTOSCOPY  07/16/2016   Procedure: CYSTOSCOPY;  Surgeon: Lindsay Farrier, MD;  Location: [facility];  Service: Gynecology;;  . ESOPHAGOGASTRODUODENOSCOPY (EGD) WITH PROPOFOL N/A 12/22/2019   Procedure: ESOPHAGOGASTRODUODENOSCOPY (EGD) WITH PROPOFOL;  Surgeon: Lindsay Ada, MD;  Location: [facility];  Service: Endoscopy;   Laterality: N/A;  . FINE NEEDLE ASPIRATION N/A 12/22/2019   Procedure: FINE NEEDLE ASPIRATION (FNA) LINEAR;  Surgeon: Lindsay Ada, MD;  Location: [facility];  Service: Endoscopy;  Laterality: N/A;  . IR IMAGING GUIDED PORT INSERTION  01/24/2020  . LAPAROSCOPIC VAGINAL HYSTERECTOMY WITH SALPINGECTOMY Bilateral 07/16/2016   Procedure: LAPAROSCOPIC ASSISTED VAGINAL HYSTERECTOMY WITH SALPINGECTOMY;  Surgeon: Lindsay Farrier, MD;  Location: [facility];  Service: Gynecology;  Laterality: Bilateral;  . MYOMECTOMY ABDOMINAL APPROACH    . THYROID SURGERY    . tyroid    . UPPER ESOPHAGEAL ENDOSCOPIC ULTRASOUND (EUS) N/A 12/22/2019   Procedure: UPPER ESOPHAGEAL ENDOSCOPIC ULTRASOUND (EUS);  Surgeon: Lindsay Ada, MD;  Location: [facility];  Service: Endoscopy;  Laterality: N/A;    I have reviewed the social history and family history with the patient and they are unchanged from previous note.  ALLERGIES:  has No Known Allergies.  MEDICATIONS:  Current Outpatient Medications  Medication Sig Dispense Refill  . aspirin EC 81 MG tablet Take 1 tablet (81 mg total) by mouth daily. 90 tablet 3  . atorvastatin (LIPITOR) 20 MG tablet Take 20 mg by mouth at bedtime.   1  . clindamycin (CLINDAGEL) 1 % gel Apply topically 2 (two) times daily. 60 g 2  . clobetasol cream (TEMOVATE) 7.01 % Apply 1 application topically daily.    Marland Kitchen doxycycline (VIBRA-TABS) 100 MG tablet Take 1 tablet (100 mg total) by mouth 2 (two) times daily. 60 tablet 1  . esomeprazole (NEXIUM) 40 MG capsule Take 40 mg by mouth daily.    . ferrous sulfate 325 (65 FE) MG tablet Take 1 tablet by mouth daily.    Marland Kitchen lidocaine-prilocaine (EMLA)  cream Apply 1 application topically as needed. 30 g 1  . lisinopril-hydrochlorothiazide (ZESTORETIC) 20-12.5 MG tablet Take 1 tablet by mouth daily with breakfast.     . magnesium oxide (MAG-OX) 400 (241.3 Mg) MG tablet Take 1 tablet (400 mg total) by mouth 2 (two) times daily. 60 tablet 1  . ondansetron  (ZOFRAN) 8 MG tablet Take 1 tablet (8 mg total) by mouth every 8 (eight) hours as needed for nausea or vomiting. (Patient not taking: Reported on 02/06/2020) 20 tablet 1  . prochlorperazine (COMPAZINE) 10 MG tablet Take 1 tablet (10 mg total) by mouth every 6 (six) hours as needed for nausea or vomiting. 30 tablet 0  . spironolactone (ALDACTONE) 25 MG tablet Take 25 mg by mouth daily with breakfast.  (Patient not taking: Reported on 01/22/2020)    . traMADol (ULTRAM) 50 MG tablet Take 1 tablet (50 mg total) by mouth every 6 (six) hours as needed. 15 tablet 0   No current facility-administered medications for this visit.   Facility-Administered Medications Ordered in Other Visits  Medication Dose Route Frequency Provider Last Rate Last Admin  . fluorouracil (ADRUCIL) 3,950 mg in sodium chloride 0.9 % 71 mL chemo infusion  2,000 mg/m2 (Treatment Plan Recorded) Intravenous 1 day or 1 dose Lindsay Merle, MD   3,950 mg at 04/03/20 1555    PHYSICAL EXAMINATION: ECOG PERFORMANCE STATUS: 1 - Symptomatic but completely ambulatory  Vitals:   04/03/20 0940  BP: 136/88  Pulse: 73  Resp: 15  Temp: 98.9 F (37.2 C)  SpO2: 100%   Filed Weights   04/03/20 0940  Weight: 184 lb 8 oz (83.7 kg)    GENERAL:alert, no distress and comfortable SKIN: skin color, texture, turgor are normal (+) Very mild sin acne rash on face EYES: normal, Conjunctiva are pink and non-injected, sclera clear OROPHARYNX:no exudate, no erythema and lips, buccal mucosa, and tongue normal NECK: supple, thyroid normal size, non-tender, without nodularity LYMPH:  no palpable lymphadenopathy in the cervical, axillary  LUNGS: clear to auscultation and percussion with normal breathing effort HEART: regular rate & rhythm and no murmurs and no lower extremity edema ABDOMEN:abdomen soft, non-tender and normal bowel sounds Musculoskeletal:no cyanosis of digits and no clubbing  NEURO: alert & oriented x 3 with fluent speech (+) very mild  sensory deficits in hands  LABORATORY DATA:  I have reviewed the data as listed CBC Latest Ref Rng & Units 04/03/2020 03/19/2020 03/05/2020  WBC 4.0 - 10.5 K/uL 4.3 4.9 5.7  Hemoglobin 12.0 - 15.0 g/dL 12.8 13.2 13.0  Hematocrit 36.0 - 46.0 % 37.5 38.6 39.2  Platelets 150 - 400 K/uL 174 167 152     CMP Latest Ref Rng & Units 04/03/2020 03/19/2020 03/05/2020  Glucose 70 - 99 mg/dL 117(H) 126(H) 127(H)  BUN 8 - 23 mg/dL 13 11 16   Creatinine 0.44 - 1.00 mg/dL 0.64 0.69 0.72  Sodium 135 - 145 mmol/L 139 138 138  Potassium 3.5 - 5.1 mmol/L 3.6 3.6 3.7  Chloride 98 - 111 mmol/L 103 101 103  CO2 22 - 32 mmol/L 27 28 27   Calcium 8.9 - 10.3 mg/dL 9.1 9.8 9.8  Total Protein 6.5 - 8.1 g/dL 7.5 7.8 7.7  Total Bilirubin 0.3 - 1.2 mg/dL 0.3 0.3 0.3  Alkaline Phos 38 - 126 U/L 93 114 96  AST 15 - 41 U/L 23 21 24   ALT 0 - 44 U/L 22 22 24       RADIOGRAPHIC STUDIES: I have personally reviewed the radiological  images as listed and agreed with the findings in the report. No results found.   ASSESSMENT & PLAN:  Lindsay [name] is a 65 y.o. female with   1.Colon cancer metastatictoliver, nodes and lung, MSS, [name]/[name] wild type -Her 12/15/19 MRI abdomen showed 2 large liver masses indicating metastatic disease and bulkyporta hepatis and retroperitoneal adenopathy. -Her EUS from 12/22/19 showed 3.5cm LN in porta hepatis regionand biopsy confirmed metastatic adenocarcinoma.Herliver biopsy results confirmed adenocarcinoma, immunostain showed positive CDX2 and CK20, negative CK7, supporting primary colorectalcancer.  -Initial PET from 01/12/20 showedknownliver metastasis, and diffuse adenopathy in thoracic and abdomen. There isa hypermetabolic 1.8 cm pulmonary nodule in left upper lobe, and focal hypermetabolic lesion in the splenic fixture of the colon,concerning for primary tumor. -Sheunderwent repeat colonoscopy and biopsy by Dr. Laruth Bouchard 01/2020. The overall picture is still consistent with  colorectal primary. -Although her cancer is not curable at this stage, it is still treatable. I started her on first-line FOLFOX q2weeks on 01/25/20. Vectibix was added with C2.  -We discussed her CT CAP from 04/01/20 which showed Significant interval reduction in size of liver mass, lymphadenopathy and left lung nodule. Overall she is having good response to treatment, will continue. I personally reviewed scan images with patient and she is happy to hear the good news.  -She continues to tolerate chemo well over all. S/p C5 she has skin dryness and cracking and only intermittent and mild neuropathy in fingertips. Mouth sores are managed well with Magic mouth wash.  -Labs reviewed and adequate to proceed with C6 FOLFOX and Vectibix with Oxaliplatin dose reduction (64m/m2).  -F/u in 2 weeks   2. Symptom Management: Acne skin rash, Skin toxicity, Constipation  -S/p C4 she has increased skin toxicity from 5FU. Dose reduced starting with C5. I encouraged her to keep her skin moisturized and clean. I also suggested cotton gloves.  -For constipation, she can continue suppositories and prune juice and I recommend Senakot S given miralax has not helped. -Her acne skin rash form Vectibix has been isolate to her face and moderate. She can continue clindamycin and  Hydrocortisone.  -She has been able to maintain eating and weight on chemo so far.   3. Mild Neuropathy  -S/p C5 she has intermittent mild neuropathy with numbness in her fingertips only. She has very mild sensory deficit on exam today (04/03/20).  -Will reduce her oxaliplatin dose starting with C6. Will continue to monitor.   4. Chronic Lower abdominal pain, Nausea, secondary to #1 -She has had chronic low abdominal pain for 5 years ranging up to 4 times a week. She notes her pain worsened in the last 3 years, but no work-up until 2021.  -For pain she is now onTramadol for severe pain.She can otherwise she Tylenol -For her intermittent  nauseashe hasZofran -I discussed to maintain weight she can continue Ensure with high protein/high calorie diet and remain active.Continue to f/u with dietician.Weight is stable.   5. HTN, DM, Heart Murmur, Arthritis  -On medications. Managed by her PCP and Cardiologist Dr GEinar Gip 6. 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  -She lives with husband and has brothers and sister in and out of town. Her only child passed in recent years.  -She notes her husband is aware of her condition and treatment, but she keeps the information to a minimum as she feels he may not handle it well.  7. Goals of care -we discussed the incurable but treatable nature of  her stage IV cancer and general prognosis. She understands the prognosis is poor if she does not tolerate or respond well to chemo -she understands the goal is palliative  -She is full code.    PLAN: -Labs reviewed and adequate to proceed with C6 FOLFOXand Vectibix today with oxaliplatin reduced to 54m/m2 due to skin toxicity  -Lab, flush, f/u and FOLFOX and Vectibix in 2, 4, 6 weeks    No problem-specific Assessment & Plan notes found for this encounter.   No orders of the defined types were placed in this encounter.  All questions were answered. The patient knows to call the clinic with any problems, questions or concerns. No barriers to learning was detected. The total time spent in the appointment was 30 minutes.     YTruitt Merle MD 04/03/2020   I, AJoslyn Devon am acting as scribe for YTruitt Merle MD.   I have reviewed the above documentation for accuracy and completeness, and I agree with the above.

## 2020-04-02 ENCOUNTER — Other Ambulatory Visit: Payer: Medicare Other

## 2020-04-02 ENCOUNTER — Ambulatory Visit: Payer: Medicare Other

## 2020-04-02 ENCOUNTER — Ambulatory Visit: Payer: Medicare Other | Admitting: Nurse Practitioner

## 2020-04-03 ENCOUNTER — Inpatient Hospital Stay: Payer: Medicare Other

## 2020-04-03 ENCOUNTER — Encounter: Payer: Self-pay | Admitting: Hematology

## 2020-04-03 ENCOUNTER — Inpatient Hospital Stay: Payer: Medicare Other | Admitting: Hematology

## 2020-04-03 ENCOUNTER — Other Ambulatory Visit: Payer: Self-pay

## 2020-04-03 VITALS — BP 136/88 | HR 73 | Temp 98.9°F | Resp 15 | Ht 66.0 in | Wt 184.5 lb

## 2020-04-03 DIAGNOSIS — Z7189 Other specified counseling: Secondary | ICD-10-CM

## 2020-04-03 DIAGNOSIS — C221 Intrahepatic bile duct carcinoma: Secondary | ICD-10-CM | POA: Diagnosis not present

## 2020-04-03 DIAGNOSIS — Z5112 Encounter for antineoplastic immunotherapy: Secondary | ICD-10-CM | POA: Diagnosis not present

## 2020-04-03 LAB — CBC WITH DIFFERENTIAL (CANCER CENTER ONLY)
Abs Immature Granulocytes: 0.02 10*3/uL (ref 0.00–0.07)
Basophils Absolute: 0 10*3/uL (ref 0.0–0.1)
Basophils Relative: 1 %
Eosinophils Absolute: 0.1 10*3/uL (ref 0.0–0.5)
Eosinophils Relative: 3 %
HCT: 37.5 % (ref 36.0–46.0)
Hemoglobin: 12.8 g/dL (ref 12.0–15.0)
Immature Granulocytes: 1 %
Lymphocytes Relative: 18 %
Lymphs Abs: 0.8 10*3/uL (ref 0.7–4.0)
MCH: 30.3 pg (ref 26.0–34.0)
MCHC: 34.1 g/dL (ref 30.0–36.0)
MCV: 88.7 fL (ref 80.0–100.0)
Monocytes Absolute: 0.6 10*3/uL (ref 0.1–1.0)
Monocytes Relative: 14 %
Neutro Abs: 2.8 10*3/uL (ref 1.7–7.7)
Neutrophils Relative %: 63 %
Platelet Count: 174 10*3/uL (ref 150–400)
RBC: 4.23 MIL/uL (ref 3.87–5.11)
RDW: 16.4 % — ABNORMAL HIGH (ref 11.5–15.5)
WBC Count: 4.3 10*3/uL (ref 4.0–10.5)
nRBC: 0 % (ref 0.0–0.2)

## 2020-04-03 LAB — CMP (CANCER CENTER ONLY)
ALT: 22 U/L (ref 0–44)
AST: 23 U/L (ref 15–41)
Albumin: 3.8 g/dL (ref 3.5–5.0)
Alkaline Phosphatase: 93 U/L (ref 38–126)
Anion gap: 9 (ref 5–15)
BUN: 13 mg/dL (ref 8–23)
CO2: 27 mmol/L (ref 22–32)
Calcium: 9.1 mg/dL (ref 8.9–10.3)
Chloride: 103 mmol/L (ref 98–111)
Creatinine: 0.64 mg/dL (ref 0.44–1.00)
GFR, Estimated: >60 mL/min (ref >60)
Glucose, Bld: 117 mg/dL — ABNORMAL HIGH (ref 70–99)
Potassium: 3.6 mmol/L (ref 3.5–5.1)
Sodium: 139 mmol/L (ref 135–145)
Total Bilirubin: 0.3 mg/dL (ref 0.3–1.2)
Total Protein: 7.5 g/dL (ref 6.5–8.1)

## 2020-04-03 LAB — MAGNESIUM: Magnesium: 1.3 mg/dL — ABNORMAL LOW (ref 1.7–2.4)

## 2020-04-03 MED ORDER — MAGNESIUM SULFATE 2 GM/50ML IV SOLN
INTRAVENOUS | Status: AC
  Filled 2020-04-03: qty 50

## 2020-04-03 MED ORDER — PALONOSETRON HCL INJECTION 0.25 MG/5ML
INTRAVENOUS | Status: AC
  Filled 2020-04-03: qty 5

## 2020-04-03 MED ORDER — SODIUM CHLORIDE 0.9 % IV SOLN
Freq: Once | INTRAVENOUS | Status: AC
  Filled 2020-04-03: qty 250

## 2020-04-03 MED ORDER — OXALIPLATIN CHEMO INJECTION 100 MG/20ML
70.0000 mg/m2 | Freq: Once | INTRAVENOUS | Status: AC
  Administered 2020-04-03: 140 mg via INTRAVENOUS
  Filled 2020-04-03: qty 28

## 2020-04-03 MED ORDER — SODIUM CHLORIDE 0.9 % IV SOLN
6.0000 mg/kg | Freq: Once | INTRAVENOUS | Status: AC
  Administered 2020-04-03: 500 mg via INTRAVENOUS
  Filled 2020-04-03: qty 20

## 2020-04-03 MED ORDER — MAGNESIUM SULFATE 4 GM/100ML IV SOLN
4.0000 g | Freq: Once | INTRAVENOUS | Status: AC
  Administered 2020-04-03: 4 g via INTRAVENOUS
  Filled 2020-04-03: qty 100

## 2020-04-03 MED ORDER — PALONOSETRON HCL INJECTION 0.25 MG/5ML
0.2500 mg | Freq: Once | INTRAVENOUS | Status: AC
  Administered 2020-04-03: 0.25 mg via INTRAVENOUS

## 2020-04-03 MED ORDER — SODIUM CHLORIDE 0.9 % IV SOLN
2000.0000 mg/m2 | INTRAVENOUS | Status: DC
  Administered 2020-04-03: 3950 mg via INTRAVENOUS
  Filled 2020-04-03: qty 79

## 2020-04-03 MED ORDER — DEXTROSE 5 % IV SOLN
Freq: Once | INTRAVENOUS | Status: AC
  Filled 2020-04-03: qty 250

## 2020-04-03 MED ORDER — MAGNESIUM SULFATE 2 GM/50ML IV SOLN
INTRAVENOUS | Status: AC
  Filled 2020-04-03: qty 100

## 2020-04-03 MED ORDER — SODIUM CHLORIDE 0.9 % IV SOLN
10.0000 mg | Freq: Once | INTRAVENOUS | Status: AC
  Administered 2020-04-03: 10 mg via INTRAVENOUS
  Filled 2020-04-03: qty 10

## 2020-04-03 MED ORDER — LEUCOVORIN CALCIUM INJECTION 350 MG
400.0000 mg/m2 | Freq: Once | INTRAVENOUS | Status: AC
  Administered 2020-04-03: 792 mg via INTRAVENOUS
  Filled 2020-04-03: qty 39.6

## 2020-04-03 NOTE — Patient Instructions (Signed)
[facility] Discharge Instructions for Patients Receiving Chemotherapy  Today you received the following chemotherapy agents: Vectibix, Oxaliplatin, leucovorin, 5FU  To help prevent nausea and vomiting after your treatment, we encourage you to take your nausea medication as directed.   If you develop nausea and vomiting that is not controlled by your nausea medication, call the clinic.   BELOW ARE SYMPTOMS THAT SHOULD BE REPORTED IMMEDIATELY:  *FEVER GREATER THAN 100.5 F  *CHILLS WITH OR WITHOUT FEVER  NAUSEA AND VOMITING THAT IS NOT CONTROLLED WITH YOUR NAUSEA MEDICATION  *UNUSUAL SHORTNESS OF BREATH  *UNUSUAL BRUISING OR BLEEDING  TENDERNESS IN MOUTH AND THROAT WITH OR WITHOUT PRESENCE OF ULCERS  *URINARY PROBLEMS  *BOWEL PROBLEMS  UNUSUAL RASH Items with * indicate a potential emergency and should be followed up as soon as possible.  Feel free to call the clinic should you have any questions or concerns. The clinic phone number is [phone].  Please show the CHEMO ALERT CARD at check-in to the Emergency Department and triage nurse.   

## 2020-04-03 NOTE — Progress Notes (Signed)
Nutrition Follow-up:   Patient with metastatic adenocarcinoma in the liver, nodes and lung, likely colorectal primary.  Patient receiving chemotherapy.   Met with patient during infusion.  Patient reports that appetite is good.  She is eating 3 meals per day. Likes chicken, fish, eggs, [place] burgers, vegetables.  Reports occasional stomach pain.  Constipation typically after treatment.      Medications: reviewed  Labs: reviewed  Anthropometrics:   Weight 184 lb today increased from 181 lb    NUTRITION DIAGNOSIS: Inadequate oral intake improved    INTERVENTION:  Encouraged well balanced diet including good sources of protein.  Encouraged fluid to help with constipation and continued bowel regimen.    MONITORING, EVALUATION, GOAL: weight trends, intake   NEXT VISIT: Monday, Feb 21 during infusion  Lindsay [name], [place], [place] Registered Dietitian [phone] (mobile)

## 2020-04-04 ENCOUNTER — Telehealth: Payer: Self-pay | Admitting: Hematology

## 2020-04-04 NOTE — Telephone Encounter (Signed)
Scheduled appointments per 1/26 los. Will have updated calendar printed for patient at next visit.  

## 2020-04-05 ENCOUNTER — Other Ambulatory Visit: Payer: Self-pay

## 2020-04-05 ENCOUNTER — Inpatient Hospital Stay: Payer: Medicare Other

## 2020-04-05 VITALS — BP 119/78 | HR 80 | Temp 98.5°F | Resp 16

## 2020-04-05 DIAGNOSIS — Z5112 Encounter for antineoplastic immunotherapy: Secondary | ICD-10-CM | POA: Diagnosis not present

## 2020-04-05 DIAGNOSIS — C221 Intrahepatic bile duct carcinoma: Secondary | ICD-10-CM

## 2020-04-05 MED ORDER — SODIUM CHLORIDE 0.9% FLUSH
10.0000 mL | INTRAVENOUS | Status: DC | PRN
  Administered 2020-04-05: 10 mL
  Filled 2020-04-05: qty 10

## 2020-04-05 MED ORDER — HEPARIN SOD (PORK) LOCK FLUSH 100 UNIT/ML IV SOLN
500.0000 [IU] | Freq: Once | INTRAVENOUS | Status: AC | PRN
Start: 2020-04-05 — End: 2020-04-05
  Administered 2020-04-05: 500 [IU]
  Filled 2020-04-05: qty 5

## 2020-04-15 NOTE — Progress Notes (Signed)
[place]   [phone] Fax:([phone]   Clinic Follow up Note   Patient Care Team: [name], MD as PCP - General (Internal Medicine) [name], RN as Oncology Nurse Navigator [name], MD as Consulting Physician (Oncology) [name], MD as Consulting Physician (Gastroenterology) 04/16/2020  CHIEF COMPLAINT: Follow up metastatic colon cancer   SUMMARY OF ONCOLOGIC HISTORY: Oncology History Overview Note  Cancer Staging No matching staging information was found for the patient.    metastatic colon cancer to liver  06/20/2019 Procedure   Colonoscopy by Dr [name] 06/20/19  IMPRESSION -Seven 3 to 10 mm polyps in the sigmoid colon, in the transverse colon and in the escending colon, removed with a cold snare. Resected and retrireved.  -One 75m polyp in the descending colon. Biopsies. Tattoes.  -Mediaum sized lipoma in the ascending colon.   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 See Comment B. Colon, Ascending, polyp, Polypectomy:  -TUBULAR ADENOMA -No high grade dysplasia or malignancy.  C. Colon, TRansverse, Polyo, polectomy:  -TUBULAR ADENOMA -No high grade dysplasia or malignancy.  D. Colon, Sigmoid, Polyp, Polypectomy:  -HYPERPLASTIC POLYP   11/07/2019 Imaging   [place]domen 11/07/19  IMPRESSION: 1. Two solid masses in the liver are nonspecific. Recommend MRI abdomen with and without contrast for further evaluation.   12/15/2019 Imaging   MRI Abdomen 12/15/19  IMPRESSION: 1. There are two large masses in the liver with appearance favoring metastatic disease or hepatocellular carcinoma or cholangiocarcinoma. A benign etiology is highly unlikely given the enhancement pattern and associated adenopathy. 2. Considerable porta hepatis and retroperitoneal adenopathy. Some of the confluent porta hepatis tumor is potentially infiltrative  and abuts the pancreatic body along its right upper margin, making it difficult to completely exclude the possibility of pancreatic adenocarcinoma primary. Possibilities helpful in further workup might include tissue diagnosis, endoscopic ultrasound, or nuclear medicine PET-CT. 3. Pancreas divisum. 4. Lumbar spondylosis and degenerative disc disease. 5. Despite efforts by the technologist and patient, motion artifact is present on today's exam and could not be eliminated. This reduces exam sensitivity and specificity.   12/22/2019 Procedure   Upper Endoscopy by Dr [name]10/15/21  IMPRESSION - One lymph node was visualized and measured in the porta hepatis region. Fine needle aspiration performed    12/22/2019 Initial Biopsy   A. LIVER, PORTA HEPATIS MASS, FINE NEEDLE 12/22/19 ASPIRATION:  FINAL MICROSCOPIC DIAGNOSIS:  - Malignant cells consistent with metastatic adenocarcinoma   01/01/2020 Initial Diagnosis   [place]   01/08/2020 Initial Biopsy   FINAL MICROSCOPIC DIAGNOSIS:   A. LIVER, LEFT LOBE, BIOPSY:  - Metastatic adenocarcinoma, consistent with a colorectal primary.  See  comment      COMMENT:   Immunohistochemical stains show the tumor cells are positive for CK20  and CDX2 but negative for CK7, consistent with above interpretation.  Dr. [name] notified on 01/10/2020   01/08/2020 Genetic Testing   [name]  [name] wildtype and [name]/[name] mutations which make her eligible for target biological agent Vectibix.   01/24/2020 Procedure   PAC placed 01/24/20   01/25/2020 -  Chemotherapy   first line FOLFOX starting 01/25/2020, Vextibix  added with C2 (02/06/20)     CURRENT THERAPY:  first line FOLFOX starting 01/25/2020, Vextibixadded with C2 (02/06/20)  INTERVAL HISTORY: Ms. FBanosreturns for follow up and treatment as scheduled. She completed cycle 6 on 04/03/20. Cold sensitivity with mild neuropathy lasts  intermittently for  2 weeks, no neuropathy in absence of cold exposure. Tongue is dark, no sores, able to eat and drink. Activity level is adequate. She had mild skin breakout but not like earlier on with vectibix. Using topicals, not sure if she is taking doxy. She has mild nausea, managed with meds. She gets constipated with mild LLQ pain around treatment time then normal. Denies other new pain. GERD resolved with tums. Denies fever, chills, cough, chest pain, dyspnea, leg edema, or other concerns.    MEDICAL HISTORY:  Past Medical History:  Diagnosis Date  . Arthritis   . Diabetes mellitus without complication ([place])   . Hypertension     SURGICAL HISTORY: Past Surgical History:  Procedure Laterality Date  . ABDOMINAL HYSTERECTOMY    . ANTERIOR AND POSTERIOR REPAIR WITH SACROSPINOUS FIXATION N/A 07/16/2016   Procedure: ANTERIOR AND POSTERIOR REPAIR WITH SACROSPINOUS FIXATION;  Surgeon: Everlene Farrier, MD;  Location: [facility];  Service: Gynecology;  Laterality: N/A;  . CYSTOSCOPY  07/16/2016   Procedure: CYSTOSCOPY;  Surgeon: Everlene Farrier, MD;  Location: [facility];  Service: Gynecology;;  . ESOPHAGOGASTRODUODENOSCOPY (EGD) WITH PROPOFOL N/A 12/22/2019   Procedure: ESOPHAGOGASTRODUODENOSCOPY (EGD) WITH PROPOFOL;  Surgeon: [name], MD;  Location: [facility];  Service: Endoscopy;  Laterality: N/A;  . FINE NEEDLE ASPIRATION N/A 12/22/2019   Procedure: FINE NEEDLE ASPIRATION (FNA) LINEAR;  Surgeon: [name], MD;  Location: [facility];  Service: Endoscopy;  Laterality: N/A;  . IR IMAGING GUIDED PORT INSERTION  01/24/2020  . LAPAROSCOPIC VAGINAL HYSTERECTOMY WITH SALPINGECTOMY Bilateral 07/16/2016   Procedure: LAPAROSCOPIC ASSISTED VAGINAL HYSTERECTOMY WITH SALPINGECTOMY;  Surgeon: Everlene Farrier, MD;  Location: [facility];  Service: Gynecology;  Laterality: Bilateral;  . MYOMECTOMY ABDOMINAL APPROACH    . THYROID SURGERY    . tyroid    . UPPER ESOPHAGEAL ENDOSCOPIC ULTRASOUND (EUS) N/A 12/22/2019    Procedure: UPPER ESOPHAGEAL ENDOSCOPIC ULTRASOUND (EUS);  Surgeon: [name], MD;  Location: [facility];  Service: Endoscopy;  Laterality: N/A;    I have reviewed the social history and family history with the patient and they are unchanged from previous note.  ALLERGIES:  has No Known Allergies.  MEDICATIONS:  Current Outpatient Medications  Medication Sig Dispense Refill  . aspirin EC 81 MG tablet Take 1 tablet (81 mg total) by mouth daily. 90 tablet 3  . atorvastatin (LIPITOR) 20 MG tablet Take 20 mg by mouth at bedtime.   1  . clindamycin (CLINDAGEL) 1 % gel Apply topically 2 (two) times daily. 60 g 2  . clobetasol cream (TEMOVATE) 2.72 % Apply 1 application topically daily.    Marland Kitchen doxycycline (VIBRA-TABS) 100 MG tablet Take 1 tablet (100 mg total) by mouth 2 (two) times daily. 60 tablet 1  . esomeprazole (NEXIUM) 40 MG capsule Take 40 mg by mouth daily.    . ferrous sulfate 325 (65 FE) MG tablet Take 1 tablet by mouth daily.    Marland Kitchen lidocaine-prilocaine (EMLA) cream Apply 1 application topically as needed. 30 g 1  . lisinopril-hydrochlorothiazide (ZESTORETIC) 20-12.5 MG tablet Take 1 tablet by mouth daily with breakfast.     . magnesium oxide (MAG-OX) 400 (241.3 Mg) MG tablet Take 1 tablet (400 mg total) by mouth 2 (two) times daily. 60 tablet 1  . prochlorperazine (COMPAZINE) 10 MG tablet Take 1 tablet (10 mg total) by mouth every 6 (six) hours as needed for nausea or vomiting. 30 tablet 0  . spironolactone (ALDACTONE) 25 MG tablet Take 25 mg by mouth daily  with breakfast.    . traMADol (ULTRAM) 50 MG tablet Take 1 tablet (50 mg total) by mouth every 6 (six) hours as needed. 15 tablet 0  . magnesium oxide (MAG-OX) 400 MG tablet Take 1 tablet by mouth 2 (two) times daily.    . ondansetron (ZOFRAN) 8 MG tablet Take 1 tablet (8 mg total) by mouth every 8 (eight) hours as needed for nausea or vomiting. (Patient not taking: No sig reported) 20 tablet 1   No current facility-administered  medications for this visit.   Facility-Administered Medications Ordered in Other Visits  Medication Dose Route Frequency Provider Last Rate Last Admin  . fluorouracil (ADRUCIL) 3,950 mg in sodium chloride 0.9 % 71 mL chemo infusion  2,000 mg/m2 (Treatment Plan Recorded) Intravenous 1 day or 1 dose [name], MD   3,950 mg at 04/16/20 1324  . heparin lock flush 100 unit/mL  500 Units Intracatheter Once PRN [name], MD      . sodium chloride flush (NS) 0.9 % injection 10 mL  10 mL Intracatheter PRN [name], MD        PHYSICAL EXAMINATION: ECOG PERFORMANCE STATUS: 1 - Symptomatic but completely ambulatory  Vitals:   04/16/20 0848  BP: (!) 153/83  Pulse: 76  Resp: 16  Temp: 98 F (36.7 C)  SpO2: 100%   Filed Weights   04/16/20 0848  Weight: 184 lb 6.4 oz (83.6 kg)    GENERAL:alert, no distress and comfortable SKIN: Mild facial acne type rash EYES: sclera clear OROPHARYNX: Tongue is discolored without thrush or ulcers LUNGS: normal breathing effort HEART:  no lower extremity edema NEURO: alert & oriented x 3 with fluent speech PAC without erythema  LABORATORY DATA:  I have reviewed the data as listed CBC Latest Ref Rng & Units 04/16/2020 04/03/2020 03/19/2020  WBC 4.0 - 10.5 K/uL 4.3 4.3 4.9  Hemoglobin 12.0 - 15.0 g/dL 12.4 12.8 13.2  Hematocrit 36.0 - 46.0 % 35.6(L) 37.5 38.6  Platelets 150 - 400 K/uL 175 174 167     CMP Latest Ref Rng & Units 04/16/2020 04/03/2020 03/19/2020  Glucose 70 - 99 mg/dL 125(H) 117(H) 126(H)  BUN 8 - 23 mg/dL 11 13 11   Creatinine 0.44 - 1.00 mg/dL 0.66 0.64 0.69  Sodium 135 - 145 mmol/L 139 139 138  Potassium 3.5 - 5.1 mmol/L 3.6 3.6 3.6  Chloride 98 - 111 mmol/L 102 103 101  CO2 22 - 32 mmol/L 27 27 28   Calcium 8.9 - 10.3 mg/dL 9.1 9.1 9.8  Total Protein 6.5 - 8.1 g/dL 7.6 7.5 7.8  Total Bilirubin 0.3 - 1.2 mg/dL 0.3 0.3 0.3  Alkaline Phos 38 - 126 U/L 99 93 114  AST 15 - 41 U/L 27 23 21   ALT 0 - 44 U/L 27 22 22       RADIOGRAPHIC  STUDIES: I have personally reviewed the radiological images as listed and agreed with the findings in the report. No results found.   ASSESSMENT & PLAN: KUULEI KLEIER a 65 y.o.femalewith   1. 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 MSS, [name]/[name] wildtype -Her 12/15/19 MRI abdomen showed 2 large liver masses indicating metastatic disease and bulkyporta hepatis and retroperitoneal adenopathy. -Her EUS from 12/22/19 showed 3.5cm LN in porta hepatis regionand biopsy confirmed metastatic adenocarcinoma.Preliminary cytology sample is not adequate for any additional IHC study for the origin of tumor, the morphology does not support HCC. -Her EGD was negative formalignancy. Her colonoscopy in March 2021 didshow a large  2 cm polyps in descending colon, biopsy showed high-grade dysplasia. -Liver biopsyconfirmed adenocarcinoma, immunostain showed positive CDX2 and CK20, negative CK7, supporting primary colorectalcancer  -PET scan showedknownliver metastasis, and diffuse thoracic and abdominaladenopathy.There isa hypermetabolic 1.8 cm pulmonary nodule in left upper lobe, and focal hypermetabolic lesion in the splenic fixture of the colon,concerning for primary tumor. -Sheunderwent repeat colonoscopy and biopsy by Dr. Benson Norway, I have requested the report and path.  -additional IHC testing with TTF-1 shows focal staining, which can be seen in 5-10% of colorectal primary cancers as well as lung cancer. However given the above, the overall picture is still consistent with colorectal primary. -She began first line systemic FOLFOX on 11/18, goal is palliative. Tolerating well -FOshows [name]/[name] wild-type, she is a candidate for EGFR inhibitor Panitumumab which was added with C2 -Restaging CT 04/01/2020 showed significant interval reduction in size of liver, lung, and nodal metastases.  CEA normalized as of 04/16/2020  2. Chronic Lower abdominal pain, Nausea  -She has had chronic  low abdominal pain for 5 years ranging up to 4 times a week. She notes her pain worsened in the last 3 years, but no work-up until 2021.  -Her pain is 5/10 at most but when high not managed on Motrin. -she has tramadol but does not take it much -encouraged her to use Ensure/boost if she has decreased appetite -f/up with dietician -abdominal pain improved on chemo  3. HTN, DM, Heart Murmur, Arthritis  -PerPCP and Cardiologist Dr Einar Gip -DM controlled, no pre-existing neuropathy -We will monitor for elevated BG and neuropathy on chemo  4. 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  -She lives with husband and has brothers and sister in and out of town. Her only child passed in recent years.   5. Goals of care -we discussed the incurable but treatable nature of her stage IV cancer and general prognosis. She understands the prognosis is poor if she does not tolerate or respond well to chemo -she understands the goal is palliative  -code status not addressed today  Disposition Ms. Jarrard appears stable. She completed 6 cycles of FOLFOX and panitumumab. She tolerates treatment well with mild neuropathy, nausea, and constipation. Side effects are well managed with supportive meds at home. We discussed symptom management. She is able to recover and function well.   Labs reviewed, CBC and CMP are stable.  Mag 1.2, she will increase oral mag to 1 tab 3 times daily and will receive 4 g IV mag with pump d/c on 2/10.  The CEA has normalized, indicating a good response to treatment.    She will proceed with C7 FOLFOX/panitumumab today as planned. She will return for f/up and cycle 8 in 2 weeks.   All questions were answered. The patient knows to call the clinic with any problems, questions or concerns. No barriers to learning were detected.     Alla Feeling, NP 04/16/20

## 2020-04-16 ENCOUNTER — Inpatient Hospital Stay: Payer: Medicare Other | Admitting: Nurse Practitioner

## 2020-04-16 ENCOUNTER — Encounter: Payer: Self-pay | Admitting: Nurse Practitioner

## 2020-04-16 ENCOUNTER — Other Ambulatory Visit: Payer: Self-pay

## 2020-04-16 ENCOUNTER — Inpatient Hospital Stay: Payer: Medicare Other

## 2020-04-16 ENCOUNTER — Inpatient Hospital Stay: Payer: Medicare Other | Attending: Hematology

## 2020-04-16 VITALS — BP 153/83 | HR 76 | Temp 98.0°F | Resp 16 | Ht 66.0 in | Wt 184.4 lb

## 2020-04-16 DIAGNOSIS — Z5111 Encounter for antineoplastic chemotherapy: Secondary | ICD-10-CM | POA: Diagnosis present

## 2020-04-16 DIAGNOSIS — Z86010 Personal history of colonic polyps: Secondary | ICD-10-CM | POA: Insufficient documentation

## 2020-04-16 DIAGNOSIS — C778 Secondary and unspecified malignant neoplasm of lymph nodes of multiple regions: Secondary | ICD-10-CM | POA: Diagnosis not present

## 2020-04-16 DIAGNOSIS — R011 Cardiac murmur, unspecified: Secondary | ICD-10-CM | POA: Insufficient documentation

## 2020-04-16 DIAGNOSIS — C189 Malignant neoplasm of colon, unspecified: Secondary | ICD-10-CM

## 2020-04-16 DIAGNOSIS — Z7189 Other specified counseling: Secondary | ICD-10-CM

## 2020-04-16 DIAGNOSIS — Z87891 Personal history of nicotine dependence: Secondary | ICD-10-CM | POA: Insufficient documentation

## 2020-04-16 DIAGNOSIS — Z7984 Long term (current) use of oral hypoglycemic drugs: Secondary | ICD-10-CM | POA: Insufficient documentation

## 2020-04-16 DIAGNOSIS — Z5112 Encounter for antineoplastic immunotherapy: Secondary | ICD-10-CM | POA: Insufficient documentation

## 2020-04-16 DIAGNOSIS — C221 Intrahepatic bile duct carcinoma: Secondary | ICD-10-CM

## 2020-04-16 DIAGNOSIS — C787 Secondary malignant neoplasm of liver and intrahepatic bile duct: Secondary | ICD-10-CM | POA: Diagnosis present

## 2020-04-16 DIAGNOSIS — C19 Malignant neoplasm of rectosigmoid junction: Secondary | ICD-10-CM | POA: Insufficient documentation

## 2020-04-16 DIAGNOSIS — Z95828 Presence of other vascular implants and grafts: Secondary | ICD-10-CM

## 2020-04-16 DIAGNOSIS — I10 Essential (primary) hypertension: Secondary | ICD-10-CM | POA: Insufficient documentation

## 2020-04-16 DIAGNOSIS — E119 Type 2 diabetes mellitus without complications: Secondary | ICD-10-CM | POA: Insufficient documentation

## 2020-04-16 LAB — CBC WITH DIFFERENTIAL (CANCER CENTER ONLY)
Abs Immature Granulocytes: 0.01 10*3/uL (ref 0.00–0.07)
Basophils Absolute: 0 10*3/uL (ref 0.0–0.1)
Basophils Relative: 1 %
Eosinophils Absolute: 0.1 10*3/uL (ref 0.0–0.5)
Eosinophils Relative: 3 %
HCT: 35.6 % — ABNORMAL LOW (ref 36.0–46.0)
Hemoglobin: 12.4 g/dL (ref 12.0–15.0)
Immature Granulocytes: 0 %
Lymphocytes Relative: 20 %
Lymphs Abs: 0.9 10*3/uL (ref 0.7–4.0)
MCH: 30.5 pg (ref 26.0–34.0)
MCHC: 34.8 g/dL (ref 30.0–36.0)
MCV: 87.7 fL (ref 80.0–100.0)
Monocytes Absolute: 0.6 10*3/uL (ref 0.1–1.0)
Monocytes Relative: 14 %
Neutro Abs: 2.7 10*3/uL (ref 1.7–7.7)
Neutrophils Relative %: 62 %
Platelet Count: 175 10*3/uL (ref 150–400)
RBC: 4.06 MIL/uL (ref 3.87–5.11)
RDW: 16.3 % — ABNORMAL HIGH (ref 11.5–15.5)
WBC Count: 4.3 10*3/uL (ref 4.0–10.5)
nRBC: 0 % (ref 0.0–0.2)

## 2020-04-16 LAB — MAGNESIUM: Magnesium: 1.2 mg/dL — ABNORMAL LOW (ref 1.7–2.4)

## 2020-04-16 LAB — CMP (CANCER CENTER ONLY)
ALT: 27 U/L (ref 0–44)
AST: 27 U/L (ref 15–41)
Albumin: 3.9 g/dL (ref 3.5–5.0)
Alkaline Phosphatase: 99 U/L (ref 38–126)
Anion gap: 10 (ref 5–15)
BUN: 11 mg/dL (ref 8–23)
CO2: 27 mmol/L (ref 22–32)
Calcium: 9.1 mg/dL (ref 8.9–10.3)
Chloride: 102 mmol/L (ref 98–111)
Creatinine: 0.66 mg/dL (ref 0.44–1.00)
GFR, Estimated: >60 mL/min (ref >60)
Glucose, Bld: 125 mg/dL — ABNORMAL HIGH (ref 70–99)
Potassium: 3.6 mmol/L (ref 3.5–5.1)
Sodium: 139 mmol/L (ref 135–145)
Total Bilirubin: 0.3 mg/dL (ref 0.3–1.2)
Total Protein: 7.6 g/dL (ref 6.5–8.1)

## 2020-04-16 LAB — CEA (IN HOUSE-CHCC): CEA (CHCC-In House): 4.21 ng/mL (ref 0.00–5.00)

## 2020-04-16 MED ORDER — SODIUM CHLORIDE 0.9 % IV SOLN
Freq: Once | INTRAVENOUS | Status: AC
  Filled 2020-04-16: qty 250

## 2020-04-16 MED ORDER — DEXTROSE 5 % IV SOLN
Freq: Once | INTRAVENOUS | Status: AC
  Filled 2020-04-16: qty 250

## 2020-04-16 MED ORDER — PALONOSETRON HCL INJECTION 0.25 MG/5ML
0.2500 mg | Freq: Once | INTRAVENOUS | Status: AC
  Administered 2020-04-16: 0.25 mg via INTRAVENOUS

## 2020-04-16 MED ORDER — HEPARIN SOD (PORK) LOCK FLUSH 100 UNIT/ML IV SOLN
500.0000 [IU] | Freq: Once | INTRAVENOUS | Status: DC | PRN
  Filled 2020-04-16: qty 5

## 2020-04-16 MED ORDER — SODIUM CHLORIDE 0.9 % IV SOLN
10.0000 mg | Freq: Once | INTRAVENOUS | Status: AC
  Administered 2020-04-16: 10 mg via INTRAVENOUS
  Filled 2020-04-16: qty 10

## 2020-04-16 MED ORDER — SODIUM CHLORIDE 0.9% FLUSH
10.0000 mL | Freq: Once | INTRAVENOUS | Status: AC
  Administered 2020-04-16: 10 mL
  Filled 2020-04-16: qty 10

## 2020-04-16 MED ORDER — SODIUM CHLORIDE 0.9% FLUSH
10.0000 mL | INTRAVENOUS | Status: DC | PRN
  Filled 2020-04-16: qty 10

## 2020-04-16 MED ORDER — PANITUMUMAB CHEMO INJECTION 100 MG/5ML
6.0000 mg/kg | Freq: Once | INTRAVENOUS | Status: AC
  Administered 2020-04-16: 500 mg via INTRAVENOUS
  Filled 2020-04-16: qty 5

## 2020-04-16 MED ORDER — SODIUM CHLORIDE 0.9 % IV SOLN
2000.0000 mg/m2 | INTRAVENOUS | Status: DC
  Administered 2020-04-16: 3950 mg via INTRAVENOUS
  Filled 2020-04-16: qty 79

## 2020-04-16 MED ORDER — LEUCOVORIN CALCIUM INJECTION 350 MG
400.0000 mg/m2 | Freq: Once | INTRAVENOUS | Status: AC
  Administered 2020-04-16: 792 mg via INTRAVENOUS
  Filled 2020-04-16: qty 39.6

## 2020-04-16 MED ORDER — PALONOSETRON HCL INJECTION 0.25 MG/5ML
INTRAVENOUS | Status: AC
  Filled 2020-04-16: qty 5

## 2020-04-16 MED ORDER — OXALIPLATIN CHEMO INJECTION 100 MG/20ML
70.0000 mg/m2 | Freq: Once | INTRAVENOUS | Status: AC
  Administered 2020-04-16: 140 mg via INTRAVENOUS
  Filled 2020-04-16: qty 20

## 2020-04-16 NOTE — Patient Instructions (Addendum)
[facility] Discharge Instructions for Patients Receiving Chemotherapy  Today you received the following chemotherapy agents: Vectibix, Oxaliplatin, leucovorin, 5FU  To help prevent nausea and vomiting after your treatment, we encourage you to take your nausea medication as directed.   If you develop nausea and vomiting that is not controlled by your nausea medication, call the clinic.   BELOW ARE SYMPTOMS THAT SHOULD BE REPORTED IMMEDIATELY:  *FEVER GREATER THAN 100.5 F  *CHILLS WITH OR WITHOUT FEVER  NAUSEA AND VOMITING THAT IS NOT CONTROLLED WITH YOUR NAUSEA MEDICATION  *UNUSUAL SHORTNESS OF BREATH  *UNUSUAL BRUISING OR BLEEDING  TENDERNESS IN MOUTH AND THROAT WITH OR WITHOUT PRESENCE OF ULCERS  *URINARY PROBLEMS  *BOWEL PROBLEMS  UNUSUAL RASH Items with * indicate a potential emergency and should be followed up as soon as possible.  Feel free to call the clinic should you have any questions or concerns. The clinic phone number is [phone].  Please show the [place] at check-in to the Emergency Department and triage nurse.

## 2020-04-18 ENCOUNTER — Inpatient Hospital Stay: Payer: Medicare Other

## 2020-04-18 ENCOUNTER — Other Ambulatory Visit: Payer: Self-pay

## 2020-04-18 VITALS — BP 104/61 | HR 82 | Temp 98.6°F | Resp 18

## 2020-04-18 DIAGNOSIS — Z5112 Encounter for antineoplastic immunotherapy: Secondary | ICD-10-CM | POA: Diagnosis not present

## 2020-04-18 DIAGNOSIS — C221 Intrahepatic bile duct carcinoma: Secondary | ICD-10-CM

## 2020-04-18 MED ORDER — SODIUM CHLORIDE 0.9 % IV SOLN
Freq: Once | INTRAVENOUS | Status: AC
  Filled 2020-04-18: qty 250

## 2020-04-18 MED ORDER — HEPARIN SOD (PORK) LOCK FLUSH 100 UNIT/ML IV SOLN
500.0000 [IU] | Freq: Once | INTRAVENOUS | Status: AC | PRN
  Administered 2020-04-18: 500 [IU]
  Filled 2020-04-18: qty 5

## 2020-04-18 MED ORDER — MAGNESIUM SULFATE 4 GM/100ML IV SOLN
4.0000 g | Freq: Once | INTRAVENOUS | Status: AC
  Administered 2020-04-18: 4 g via INTRAVENOUS
  Filled 2020-04-18: qty 100

## 2020-04-18 MED ORDER — SODIUM CHLORIDE 0.9% FLUSH
10.0000 mL | INTRAVENOUS | Status: DC | PRN
  Administered 2020-04-18: 10 mL
  Filled 2020-04-18: qty 10

## 2020-04-18 NOTE — Patient Instructions (Signed)
Hypomagnesemia Hypomagnesemia is a condition in which the level of magnesium in the blood is low. Magnesium is a mineral that is found in many foods. It is used in many different processes in the body. Hypomagnesemia can affect every organ in the body. In severe cases, it can cause life-threatening problems. What are the causes? This condition may be caused by:  Not getting enough magnesium in your diet.  Malnutrition.  Problems with absorbing magnesium from the intestines.  Dehydration.  Alcohol abuse.  Vomiting.  Severe or chronic diarrhea.  Some medicines, including medicines that make you urinate more (diuretics).  Certain diseases, such as kidney disease, diabetes, celiac disease, and overactive thyroid. What are the signs or symptoms? Symptoms of this condition include:  Loss of appetite.  Nausea and vomiting.  Involuntary shaking or trembling of a body part (tremor).  Muscle weakness.  Tingling in the arms and legs.  Sudden tightening of muscles (muscle spasms).  Confusion.  Psychiatric issues, such as depression, irritability, or psychosis.  A feeling of fluttering of the heart.  Seizures. These symptoms are more severe if magnesium levels drop suddenly. How is this diagnosed? This condition may be diagnosed based on:  Your symptoms and medical history.  A physical exam.  Blood and urine tests. How is this treated? Treatment depends on the cause and the severity of the condition. It may be treated with:  A magnesium supplement. This can be taken in pill form. If the condition is severe, magnesium is usually given through an IV.  Changes to your diet. You may be directed to eat foods that have a lot of magnesium, such as green leafy vegetables, peas, beans, and nuts.  Stopping any intake of alcohol.   Follow these instructions at home:  Make sure that your diet includes foods with magnesium. Foods that have a lot of magnesium in them  include: ? Green leafy vegetables, such as spinach and broccoli. ? Beans and peas. ? Nuts and seeds, such as almonds and sunflower seeds. ? Whole grains, such as whole grain bread and fortified cereals.  Take magnesium supplements if your health care provider tells you to do that. Take them as directed.  Take over-the-counter and prescription medicines only as told by your health care provider.  Have your magnesium levels monitored as told by your health care provider.  When you are active, drink fluids that contain electrolytes.  Avoid drinking alcohol.  Keep all follow-up visits as told by your health care provider. This is important.      Contact a health care provider if:  You get worse instead of better.  Your symptoms return. Get help right away if you:  Develop severe muscle weakness.  Have trouble breathing.  Feel that your heart is racing. Summary  Hypomagnesemia is a condition in which the level of magnesium in the blood is low.  Hypomagnesemia can affect every organ in the body.  Treatment may include eating more foods that contain magnesium, taking magnesium supplements, and not drinking alcohol.  Have your magnesium levels monitored as told by your health care provider. This information is not intended to replace advice given to you by your health care provider. Make sure you discuss any questions you have with your health care provider. Document Revised: 07/27/2019 Document Reviewed: 07/27/2019 Elsevier Patient Education  2021 [employer]  

## 2020-04-26 NOTE — Progress Notes (Signed)
[facility]   [phone] [phone]   Clinic Follow up Note   Patient Care Team: Moreira, Roy, MD as PCP - General (Internal Medicine) Anderson, Cheryl L, RN as Oncology Nurse Navigator Feng, Yan, MD as Consulting Physician (Oncology) Hung, Patrick, MD as Consulting Physician (Gastroenterology)  Date of Service:  04/29/2020  CHIEF COMPLAINT: Follow-up metastatic colon cancer  SUMMARY OF ONCOLOGIC HISTORY: Oncology History Overview Note  Cancer Staging No matching staging information was found for the patient.    metastatic colon cancer to liver  06/20/2019 Procedure   Colonoscopy by Dr Mansouraty 06/20/19  IMPRESSION -Seven 3 to 10 mm polyps in the sigmoid colon, in the transverse colon and in the escending colon, removed with a cold snare. Resected and retrireved.  -One 20mm polyp in the descending colon. Biopsies. Tattoes.  -Mediaum sized lipoma in the ascending colon.   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 See Comment B. Colon, Ascending, polyp, Polypectomy:  -TUBULAR ADENOMA -No high grade dysplasia or malignancy.  C. Colon, TRansverse, Polyo, polectomy:  -TUBULAR ADENOMA -No high grade dysplasia or malignancy.  D. Colon, Sigmoid, Polyp, Polypectomy:  -HYPERPLASTIC POLYP   11/07/2019 Imaging   US Abdomen 11/07/19  IMPRESSION: 1. Two solid masses in the liver are nonspecific. Recommend MRI abdomen with and without contrast for further evaluation.   12/15/2019 Imaging   MRI Abdomen 12/15/19  IMPRESSION: 1. There are two large masses in the liver with appearance favoring metastatic disease or hepatocellular carcinoma or cholangiocarcinoma. A benign etiology is highly unlikely given the enhancement pattern and associated adenopathy. 2. Considerable porta hepatis and retroperitoneal adenopathy. Some of the confluent porta hepatis tumor is  potentially infiltrative and abuts the pancreatic body along its right upper margin, making it difficult to completely exclude the possibility of pancreatic adenocarcinoma primary. Possibilities helpful in further workup might include tissue diagnosis, endoscopic ultrasound, or nuclear medicine PET-CT. 3. Pancreas divisum. 4. Lumbar spondylosis and degenerative disc disease. 5. Despite efforts by the technologist and patient, motion artifact is present on today's exam and could not be eliminated. This reduces exam sensitivity and specificity.   12/22/2019 Procedure   Upper Endoscopy by Dr Hung 12/22/19  IMPRESSION - One lymph node was visualized and measured in the porta hepatis region. Fine needle aspiration performed    12/22/2019 Initial Biopsy   A. LIVER, PORTA HEPATIS MASS, FINE NEEDLE 12/22/19 ASPIRATION:  FINAL MICROSCOPIC DIAGNOSIS:  - Malignant cells consistent with metastatic adenocarcinoma   01/01/2020 Initial Diagnosis   Intrahepatic cholangiocarcinoma (HCC)   01/08/2020 Initial Biopsy   FINAL MICROSCOPIC DIAGNOSIS:   A. LIVER, LEFT LOBE, BIOPSY:  - Metastatic adenocarcinoma, consistent with a colorectal primary.  See  comment      COMMENT:   Immunohistochemical stains show the tumor cells are positive for CK20  and CDX2 but negative for CK7, consistent with above interpretation.  Dr. Feng was notified on 01/10/2020   01/08/2020 Genetic Testing   [name]  [name] wildtype and [name]/[name] mutations which make her eligible for target biological agent Vectibix.   01/24/2020 Procedure   PAC placed 01/24/20   01/25/2020 -  Chemotherapy   first line FOLFOX starting 01/25/2020, Vextibix  added with C2 (02/06/20)      CURRENT THERAPY:  first line FOLFOX starting 01/25/2020, Vextibixadded with C2 (02/06/20)  INTERVAL HISTORY:  Lindsay [name] is here for a follow up. She presents to the clinic alone. She notes   when she woke up this morning her stomach  started to hurt. This persisted even after eating . She notes she was able to continue her activities. She notes she had pain like this in the past. I reviewed her medication list with her. She notes she tolerated her recent chemo cycle. She denies nausea or diarrhea. She notes her skin rash is mild now.     REVIEW OF SYSTEMS:   Constitutional: Denies fevers, chills or abnormal weight loss Eyes: Denies blurriness of vision Ears, nose, mouth, throat, and face: Denies mucositis or sore throat Respiratory: Denies cough, dyspnea or wheezes Cardiovascular: Denies palpitation, chest discomfort or lower extremity swelling Gastrointestinal:  Denies nausea, heartburn or change in bowel habits (+) Abdominal pain Skin: (+) Mild skin rash  Lymphatics: Denies new lymphadenopathy or easy bruising Neurological:Denies numbness, tingling or new weaknesses Behavioral/Psych: Mood is stable, no new changes  All other systems were reviewed with the patient and are negative.  MEDICAL HISTORY:  Past Medical History:  Diagnosis Date  . Arthritis   . Diabetes mellitus without complication (HCC)   . Hypertension     SURGICAL HISTORY: Past Surgical History:  Procedure Laterality Date  . ABDOMINAL HYSTERECTOMY    . ANTERIOR AND POSTERIOR REPAIR WITH SACROSPINOUS FIXATION N/A 07/16/2016   Procedure: ANTERIOR AND POSTERIOR REPAIR WITH SACROSPINOUS FIXATION;  Surgeon: Tomblin, James, MD;  Location: [facility];  Service: Gynecology;  Laterality: N/A;  . CYSTOSCOPY  07/16/2016   Procedure: CYSTOSCOPY;  Surgeon: Tomblin, James, MD;  Location: [facility];  Service: Gynecology;;  . ESOPHAGOGASTRODUODENOSCOPY (EGD) WITH PROPOFOL N/A 12/22/2019   Procedure: ESOPHAGOGASTRODUODENOSCOPY (EGD) WITH PROPOFOL;  Surgeon: Hung, Patrick, MD;  Location: [facility];  Service: Endoscopy;  Laterality: N/A;  . FINE NEEDLE ASPIRATION N/A 12/22/2019   Procedure: FINE NEEDLE ASPIRATION (FNA) LINEAR;  Surgeon: Hung, Patrick, MD;  Location: [facility];  Service: Endoscopy;  Laterality: N/A;  . IR IMAGING GUIDED PORT INSERTION  01/24/2020  . LAPAROSCOPIC VAGINAL HYSTERECTOMY WITH SALPINGECTOMY Bilateral 07/16/2016   Procedure: LAPAROSCOPIC ASSISTED VAGINAL HYSTERECTOMY WITH SALPINGECTOMY;  Surgeon: Tomblin, James, MD;  Location: [facility];  Service: Gynecology;  Laterality: Bilateral;  . MYOMECTOMY ABDOMINAL APPROACH    . THYROID SURGERY    . tyroid    . UPPER ESOPHAGEAL ENDOSCOPIC ULTRASOUND (EUS) N/A 12/22/2019   Procedure: UPPER ESOPHAGEAL ENDOSCOPIC ULTRASOUND (EUS);  Surgeon: Hung, Patrick, MD;  Location: [facility];  Service: Endoscopy;  Laterality: N/A;    I have reviewed the social history and family history with the patient and they are unchanged from previous note.  ALLERGIES:  has No Known Allergies.  MEDICATIONS:  Current Outpatient Medications  Medication Sig Dispense Refill  . aspirin EC 81 MG tablet Take 1 tablet (81 mg total) by mouth daily. 90 tablet 3  . atorvastatin (LIPITOR) 20 MG tablet Take 20 mg by mouth at bedtime.   1  . clindamycin (CLINDAGEL) 1 % gel Apply topically 2 (two) times daily. 60 g 2  . clobetasol cream (TEMOVATE) 0.05 % Apply 1 application topically daily.    . doxycycline (VIBRA-TABS) 100 MG tablet Take 1 tablet (100 mg total) by mouth 2 (two) times daily. 60 tablet 1  . esomeprazole (NEXIUM) 40 MG capsule Take 1 capsule (40 mg total) by mouth daily. 30 capsule 2  . ferrous sulfate 325 (65 FE) MG tablet Take 1 tablet by mouth daily.    . lidocaine-prilocaine (EMLA) cream Apply 1 application topically as needed. 30 g 1  . lisinopril-hydrochlorothiazide (ZESTORETIC)   20-12.5 MG tablet Take 1 tablet by mouth daily with breakfast.     . magnesium oxide (MAG-OX) 400 (241.3 Mg) MG tablet Take 1 tablet (400 mg total) by mouth in the morning, at noon, and at bedtime. 90 tablet 2  . magnesium oxide (MAG-OX) 400 MG tablet Take 1 tablet by mouth 2 (two) times daily.    . ondansetron (ZOFRAN) 8 MG  tablet Take 1 tablet (8 mg total) by mouth every 8 (eight) hours as needed for nausea or vomiting. (Patient not taking: No sig reported) 20 tablet 1  . prochlorperazine (COMPAZINE) 10 MG tablet Take 1 tablet (10 mg total) by mouth every 6 (six) hours as needed for nausea or vomiting. 30 tablet 0  . spironolactone (ALDACTONE) 25 MG tablet Take 25 mg by mouth daily with breakfast.    . traMADol (ULTRAM) 50 MG tablet Take 1 tablet (50 mg total) by mouth every 6 (six) hours as needed. 15 tablet 0   No current facility-administered medications for this visit.   Facility-Administered Medications Ordered in Other Visits  Medication Dose Route Frequency Provider Last Rate Last Admin  . fluorouracil (ADRUCIL) 3,950 mg in sodium chloride 0.9 % 71 mL chemo infusion  2,000 mg/m2 (Treatment Plan Recorded) Intravenous 1 day or 1 dose Feng, Yan, MD   3,950 mg at 04/29/20 1443  . heparin lock flush 100 unit/mL  500 Units Intracatheter Once PRN Feng, Yan, MD      . sodium chloride flush (NS) 0.9 % injection 10 mL  10 mL Intracatheter PRN Feng, Yan, MD        PHYSICAL EXAMINATION: ECOG PERFORMANCE STATUS: 1 - Symptomatic but completely ambulatory  Vitals:   04/29/20 0914  BP: 134/80  Pulse: 78  Resp: 16  Temp: (!) 97.2 F (36.2 C)  SpO2: 100%   Filed Weights   04/29/20 0914  Weight: 183 lb 14.4 oz (83.4 kg)    Due to COVID19 we will limit examination to appearance. Patient had no complaints.  GENERAL:alert, no distress and comfortable SKIN: skin color normal, no rashes or significant lesions (+) Skin darkening of hands EYES: normal, Conjunctiva are pink and non-injected, sclera clear  NEURO: alert & oriented x 3 with fluent speech   LABORATORY DATA:  I have reviewed the data as listed CBC Latest Ref Rng & Units 04/29/2020 04/16/2020 04/03/2020  WBC 4.0 - 10.5 K/uL 4.5 4.3 4.3  Hemoglobin 12.0 - 15.0 g/dL 12.3 12.4 12.8  Hematocrit 36.0 - 46.0 % 35.3(L) 35.6(L) 37.5  Platelets 150 - 400 K/uL 163  175 174     CMP Latest Ref Rng & Units 04/29/2020 04/16/2020 04/03/2020  Glucose 70 - 99 mg/dL 107(H) 125(H) 117(H)  BUN 8 - 23 mg/dL 15 11 13  Creatinine 0.44 - 1.00 mg/dL 0.69 0.66 0.64  Sodium 135 - 145 mmol/L 138 139 139  Potassium 3.5 - 5.1 mmol/L 3.9 3.6 3.6  Chloride 98 - 111 mmol/L 99 102 103  CO2 22 - 32 mmol/L 27 27 27  Calcium 8.9 - 10.3 mg/dL 9.1 9.1 9.1  Total Protein 6.5 - 8.1 g/dL 7.8 7.6 7.5  Total Bilirubin 0.3 - 1.2 mg/dL 0.3 0.3 0.3  Alkaline Phos 38 - 126 U/L 99 99 93  AST 15 - 41 U/L 28 27 23  ALT 0 - 44 U/L 24 27 22      RADIOGRAPHIC STUDIES: I have personally reviewed the radiological images as listed and agreed with the findings in the report. No results   found.   ASSESSMENT & PLAN:  Lindsay [name] is a 64 y.o. female with    1.Colon cancer metastatictoliver, nodes and lung, MSS, [name]/[name] wild type -Her 12/15/19 MRI abdomen showed 2 large liver masses indicating metastatic disease and bulkyporta hepatis and retroperitoneal adenopathy. -Her EUS from 12/22/19 showed 3.5cm LN in porta hepatis regionand biopsy confirmed metastatic adenocarcinoma.Herliver biopsy results confirmed adenocarcinoma, immunostain showed positive CDX2 and CK20, negative CK7, supporting primary colorectalcancer.  -Initial PET from 01/12/20 showedknownliver metastasis, and diffuse adenopathy in thoracic and abdomen. There isa hypermetabolic 1.8 cm pulmonary nodule in left upper lobe, and focal hypermetabolic lesion in the splenic fixture of the colon,concerning for primary tumor. -Sheunderwent repeat colonoscopy and biopsy by Dr. Hungin 01/2020. The overall picture is still consistent with colorectal primary. -Although her cancer is not curable at this stage, it is still treatable. I started her on first-line FOLFOX q2weeks on 01/25/20.Vectibix was added with C2.  -S/p C7 she continues to tolerate well. Her skin acne rash is mild. Labs reviewed, and adequate to proceed with C8  FOLFOX and Vectibix today. Neuropathy is mild and intermittent now, will reduce dose of Oxaliplatin and plan to hold after 6 months chemo or sooner if needed.   -Her tumor marker is in normal range now. Her lower abdominal pain has resolved recently. This is indication of good clinical response to treatment. Plan to scan her soon.  -F/u in 2 weeks.    2. Symptom Management: Acne skin rash, Skin toxicity, Constipation  -S/p C4 she has increased skin toxicity from 5FU. Dose reduced starting with C5. I encouraged her to keep her skin moisturized and clean. I also suggested cotton gloves.  -For constipation, she can continue suppositories and prune juice and I recommend Senakot S given miralax has not helped. -Her acne skin rash form Vectibix has been isolate to her face and mild-moderate. She can continue clindamycin and Hydrocortisone.  -She has been able to maintain eating and weight on chemo so far. -She notes new onset of stomach pain this morning (04/29/20). I recommend she start with Nexium. I refilled today.   3. Mild Neuropathy G1 -S/p C5 she has intermittent mild neuropathy with numbness in her fingertips only. -Will reduce her oxaliplatin dose starting with C6. Will continue to monitor. Stable.   4. Chronic Lower abdominal pain, Nausea, secondary to #1 -She has had chronic low abdominal pain for 5 years ranging up to 4 times a week. She notes her pain worsened in the last 3 years, but no work-up until 2021.  -For pain she is now onTramadol for severe pain.She can otherwise she Tylenol -For her intermittent nauseashe hasZofran -I discussed to maintain weight she can continue Ensure with high protein/high calorie diet and remain active.Continue to f/u with dietician.Weight is stable.  5. HTN, DM, Heart Murmur, Arthritis  -On medications. Managed by her PCP and Cardiologist Dr Ganji  6. Financial and Social Support  -She has [employer] insurance. She is retired.  -She lives  with husband and has brothers and sister in and out of town. Her only child passed in recent years.  -She notes her husband is aware of her condition and treatment, but she keeps the information to a minimum as she feels he may not handle it well.  7. Goals of care -we discussed the incurable but treatable nature of her stage IV cancer and general prognosis. She understands the prognosis is poor if she does not tolerate or respond well to chemo -she understands   the goal is palliative  -She is full code.   8. Hypomagnesia secondary to Vectibix  -Her mag is 1.2 today (04/29/20). She denies diarrhea.  -I called in oral mag for her to take daily.    PLAN: -I called in mag today and refilled her Nexium  -Labs reviewed and adequate to proceed with C6FOLFOXand Vectibix today with oxaliplatin reduced to 60mg/m2 for neuropathy  -Lab, flush,f/u and FOLFOX and Vectibix in 2, 4, 6 weeks    No problem-specific Assessment & Plan notes found for this encounter.   No orders of the defined types were placed in this encounter.  All questions were answered. The patient knows to call the clinic with any problems, questions or concerns. No barriers to learning was detected. The total time spent in the appointment was 30 minutes.     Yan Feng, MD 04/29/2020   I, Amoya Bennett, am acting as scribe for Yan Feng, MD.   I have reviewed the above documentation for accuracy and completeness, and I agree with the above.       

## 2020-04-29 ENCOUNTER — Inpatient Hospital Stay: Payer: Medicare Other

## 2020-04-29 ENCOUNTER — Encounter: Payer: Self-pay | Admitting: Hematology

## 2020-04-29 ENCOUNTER — Inpatient Hospital Stay: Payer: Medicare Other | Admitting: Nutrition

## 2020-04-29 ENCOUNTER — Other Ambulatory Visit: Payer: Self-pay

## 2020-04-29 ENCOUNTER — Inpatient Hospital Stay: Payer: Medicare Other | Admitting: Hematology

## 2020-04-29 VITALS — BP 134/80 | HR 78 | Temp 97.2°F | Resp 16 | Ht 66.0 in | Wt 183.9 lb

## 2020-04-29 DIAGNOSIS — C221 Intrahepatic bile duct carcinoma: Secondary | ICD-10-CM | POA: Diagnosis not present

## 2020-04-29 DIAGNOSIS — Z5112 Encounter for antineoplastic immunotherapy: Secondary | ICD-10-CM | POA: Diagnosis not present

## 2020-04-29 DIAGNOSIS — Z7189 Other specified counseling: Secondary | ICD-10-CM

## 2020-04-29 DIAGNOSIS — Z95828 Presence of other vascular implants and grafts: Secondary | ICD-10-CM

## 2020-04-29 LAB — CBC WITH DIFFERENTIAL (CANCER CENTER ONLY)
Abs Immature Granulocytes: 0.01 10*3/uL (ref 0.00–0.07)
Basophils Absolute: 0 10*3/uL (ref 0.0–0.1)
Basophils Relative: 1 %
Eosinophils Absolute: 0.1 10*3/uL (ref 0.0–0.5)
Eosinophils Relative: 2 %
HCT: 35.3 % — ABNORMAL LOW (ref 36.0–46.0)
Hemoglobin: 12.3 g/dL (ref 12.0–15.0)
Immature Granulocytes: 0 %
Lymphocytes Relative: 23 %
Lymphs Abs: 1 10*3/uL (ref 0.7–4.0)
MCH: 30.8 pg (ref 26.0–34.0)
MCHC: 34.8 g/dL (ref 30.0–36.0)
MCV: 88.5 fL (ref 80.0–100.0)
Monocytes Absolute: 0.7 10*3/uL (ref 0.1–1.0)
Monocytes Relative: 15 %
Neutro Abs: 2.6 10*3/uL (ref 1.7–7.7)
Neutrophils Relative %: 59 %
Platelet Count: 163 10*3/uL (ref 150–400)
RBC: 3.99 MIL/uL (ref 3.87–5.11)
RDW: 16.5 % — ABNORMAL HIGH (ref 11.5–15.5)
WBC Count: 4.5 10*3/uL (ref 4.0–10.5)
nRBC: 0 % (ref 0.0–0.2)

## 2020-04-29 LAB — CMP (CANCER CENTER ONLY)
ALT: 24 U/L (ref 0–44)
AST: 28 U/L (ref 15–41)
Albumin: 4 g/dL (ref 3.5–5.0)
Alkaline Phosphatase: 99 U/L (ref 38–126)
Anion gap: 12 (ref 5–15)
BUN: 15 mg/dL (ref 8–23)
CO2: 27 mmol/L (ref 22–32)
Calcium: 9.1 mg/dL (ref 8.9–10.3)
Chloride: 99 mmol/L (ref 98–111)
Creatinine: 0.69 mg/dL (ref 0.44–1.00)
GFR, Estimated: >60 mL/min (ref >60)
Glucose, Bld: 107 mg/dL — ABNORMAL HIGH (ref 70–99)
Potassium: 3.9 mmol/L (ref 3.5–5.1)
Sodium: 138 mmol/L (ref 135–145)
Total Bilirubin: 0.3 mg/dL (ref 0.3–1.2)
Total Protein: 7.8 g/dL (ref 6.5–8.1)

## 2020-04-29 LAB — MAGNESIUM: Magnesium: 1.2 mg/dL — ABNORMAL LOW (ref 1.7–2.4)

## 2020-04-29 MED ORDER — DEXTROSE 5 % IV SOLN
Freq: Once | INTRAVENOUS | Status: AC
  Filled 2020-04-29: qty 250

## 2020-04-29 MED ORDER — OXALIPLATIN CHEMO INJECTION 100 MG/20ML
60.0000 mg/m2 | Freq: Once | INTRAVENOUS | Status: AC
  Administered 2020-04-29: 120 mg via INTRAVENOUS
  Filled 2020-04-29: qty 20

## 2020-04-29 MED ORDER — MAGNESIUM OXIDE 400 (241.3 MG) MG PO TABS: 400.0000 mg | ORAL_TABLET | Freq: Three times a day (TID) | ORAL | 2 refills | Status: DC

## 2020-04-29 MED ORDER — SODIUM CHLORIDE 0.9 % IV SOLN
2000.0000 mg/m2 | INTRAVENOUS | Status: DC
  Administered 2020-04-29: 3950 mg via INTRAVENOUS
  Filled 2020-04-29: qty 79

## 2020-04-29 MED ORDER — PALONOSETRON HCL INJECTION 0.25 MG/5ML
0.2500 mg | Freq: Once | INTRAVENOUS | Status: AC
  Administered 2020-04-29: 0.25 mg via INTRAVENOUS

## 2020-04-29 MED ORDER — LEUCOVORIN CALCIUM INJECTION 350 MG
400.0000 mg/m2 | Freq: Once | INTRAVENOUS | Status: AC
  Administered 2020-04-29: 792 mg via INTRAVENOUS
  Filled 2020-04-29: qty 39.6

## 2020-04-29 MED ORDER — HEPARIN SOD (PORK) LOCK FLUSH 100 UNIT/ML IV SOLN
500.0000 [IU] | Freq: Once | INTRAVENOUS | Status: DC | PRN
  Filled 2020-04-29: qty 5

## 2020-04-29 MED ORDER — PALONOSETRON HCL INJECTION 0.25 MG/5ML
INTRAVENOUS | Status: AC
  Filled 2020-04-29: qty 5

## 2020-04-29 MED ORDER — ESOMEPRAZOLE MAGNESIUM 40 MG PO CPDR: 40.0000 mg | DELAYED_RELEASE_CAPSULE | Freq: Every day | ORAL | 2 refills | Status: DC

## 2020-04-29 MED ORDER — SODIUM CHLORIDE 0.9% FLUSH
10.0000 mL | INTRAVENOUS | Status: DC | PRN
  Filled 2020-04-29: qty 10

## 2020-04-29 MED ORDER — SODIUM CHLORIDE 0.9% FLUSH
10.0000 mL | Freq: Once | INTRAVENOUS | Status: AC
  Administered 2020-04-29: 10 mL
  Filled 2020-04-29: qty 10

## 2020-04-29 MED ORDER — SODIUM CHLORIDE 0.9 % IV SOLN
6.0000 mg/kg | Freq: Once | INTRAVENOUS | Status: AC
  Administered 2020-04-29: 500 mg via INTRAVENOUS
  Filled 2020-04-29: qty 20

## 2020-04-29 MED ORDER — SODIUM CHLORIDE 0.9 % IV SOLN
10.0000 mg | Freq: Once | INTRAVENOUS | Status: AC
  Administered 2020-04-29: 10 mg via INTRAVENOUS
  Filled 2020-04-29: qty 10

## 2020-04-29 NOTE — Progress Notes (Signed)
Nutrition follow-up completed with patient during infusion for metastatic adenocarcinoma of the liver, nodes, and lung, likely colorectal primary. Weight is stable at 183.9 pounds. Patient denies nausea, vomiting, and diarrhea. She reports occasional constipation. She has no nutrition concerns at this time.  Nutrition diagnosis: Inadequate oral intake resolved.  Provided support and encouragement for patient to continue strategies for adequate calorie and protein intake for weight maintenance.  Encouraged her to contact RD with further questions or concerns.  **Disclaimer: This note was dictated with voice recognition software. Similar sounding words can inadvertently be transcribed and this note may contain transcription errors which may not have been corrected upon publication of note.**

## 2020-04-29 NOTE — Patient Instructions (Signed)
Implanted Port Insertion, Care After This sheet gives you information about how to care for yourself after your procedure. Your health care provider may also give you more specific instructions. If you have problems or questions, contact your health care provider. What can I expect after the procedure? After the procedure, it is common to have:  Discomfort at the port insertion site.  Bruising on the skin over the port. This should improve over 3-4 days. Follow these instructions at home: Port care  After your port is placed, you will get a manufacturer's information card. The card has information about your port. Keep this card with you at all times.  Take care of the port as told by your health care provider. Ask your health care provider if you or a family member can get training for taking care of the port at home. A home health care nurse may also take care of the port.  Make sure to remember what type of port you have. Incision care  Follow instructions from your health care provider about how to take care of your port insertion site. Make sure you: ? Wash your hands with soap and water before and after you change your bandage (dressing). If soap and water are not available, use hand sanitizer. ? Change your dressing as told by your health care provider. ? Leave stitches (sutures), skin glue, or adhesive strips in place. These skin closures may need to stay in place for 2 weeks or longer. If adhesive strip edges start to loosen and curl up, you may trim the loose edges. Do not remove adhesive strips completely unless your health care provider tells you to do that.  Check your port insertion site every day for signs of infection. Check for: ? Redness, swelling, or pain. ? Fluid or blood. ? Warmth. ? Pus or a bad smell.      Activity  Return to your normal activities as told by your health care provider. Ask your health care provider what activities are safe for you.  Do not  lift anything that is heavier than 10 lb (4.5 kg), or the limit that you are told, until your health care provider says that it is safe. General instructions  Take over-the-counter and prescription medicines only as told by your health care provider.  Do not take baths, swim, or use a hot tub until your health care provider approves. Ask your health care provider if you may take showers. You may only be allowed to take sponge baths.  Do not drive for 24 hours if you were given a sedative during your procedure.  Wear a medical alert bracelet in case of an emergency. This will tell any health care providers that you have a port.  Keep all follow-up visits as told by your health care provider. This is important. Contact a health care provider if:  You cannot flush your port with saline as directed, or you cannot draw blood from the port.  You have a fever or chills.  You have redness, swelling, or pain around your port insertion site.  You have fluid or blood coming from your port insertion site.  Your port insertion site feels warm to the touch.  You have pus or a bad smell coming from the port insertion site. Get help right away if:  You have chest pain or shortness of breath.  You have bleeding from your port that you cannot control. Summary  Take care of the port as told by your   health care provider. Keep the manufacturer's information card with you at all times.  Change your dressing as told by your health care provider.  Contact a health care provider if you have a fever or chills or if you have redness, swelling, or pain around your port insertion site.  Keep all follow-up visits as told by your health care provider. This information is not intended to replace advice given to you by your health care provider. Make sure you discuss any questions you have with your health care provider. Document Revised: 09/21/2017 Document Reviewed: 09/21/2017 Elsevier Patient Education   2021 [employer]  

## 2020-04-30 ENCOUNTER — Telehealth: Payer: Self-pay | Admitting: Nurse Practitioner

## 2020-04-30 ENCOUNTER — Other Ambulatory Visit: Payer: Self-pay | Admitting: Hematology

## 2020-04-30 ENCOUNTER — Telehealth: Payer: Self-pay | Admitting: Hematology

## 2020-04-30 NOTE — Telephone Encounter (Signed)
Checked out appointment. No LOS notes needing to be scheduled. No changes made. 

## 2020-04-30 NOTE — Telephone Encounter (Signed)
Moved upcoming appointment due to provider's template. Patient is aware of changes. 

## 2020-05-01 ENCOUNTER — Inpatient Hospital Stay: Payer: Medicare Other

## 2020-05-01 ENCOUNTER — Other Ambulatory Visit: Payer: Self-pay

## 2020-05-01 VITALS — BP 110/66 | HR 88 | Temp 98.6°F | Resp 16 | Ht 66.0 in | Wt 185.3 lb

## 2020-05-01 DIAGNOSIS — C221 Intrahepatic bile duct carcinoma: Secondary | ICD-10-CM

## 2020-05-01 DIAGNOSIS — Z5112 Encounter for antineoplastic immunotherapy: Secondary | ICD-10-CM | POA: Diagnosis not present

## 2020-05-01 DIAGNOSIS — Z95828 Presence of other vascular implants and grafts: Secondary | ICD-10-CM

## 2020-05-01 MED ORDER — HEPARIN SOD (PORK) LOCK FLUSH 100 UNIT/ML IV SOLN
500.0000 [IU] | Freq: Once | INTRAVENOUS | Status: AC | PRN
  Administered 2020-05-01: 500 [IU]
  Filled 2020-05-01: qty 5

## 2020-05-01 MED ORDER — SODIUM CHLORIDE 0.9 % IV SOLN
Freq: Once | INTRAVENOUS | Status: AC
  Filled 2020-05-01: qty 250

## 2020-05-01 MED ORDER — MAGNESIUM SULFATE 4 GM/100ML IV SOLN
4.0000 g | Freq: Once | INTRAVENOUS | Status: AC
  Administered 2020-05-01: 4 g via INTRAVENOUS
  Filled 2020-05-01: qty 100

## 2020-05-01 MED ORDER — SODIUM CHLORIDE 0.9% FLUSH
10.0000 mL | Freq: Once | INTRAVENOUS | Status: AC | PRN
  Administered 2020-05-01: 10 mL
  Filled 2020-05-01: qty 10

## 2020-05-01 NOTE — Patient Instructions (Signed)
Hypomagnesemia Hypomagnesemia is a condition in which the level of magnesium in the blood is low. Magnesium is a mineral that is found in many foods. It is used in many different processes in the body. Hypomagnesemia can affect every organ in the body. In severe cases, it can cause life-threatening problems. What are the causes? This condition may be caused by:  Not getting enough magnesium in your diet.  Malnutrition.  Problems with absorbing magnesium from the intestines.  Dehydration.  Alcohol abuse.  Vomiting.  Severe or chronic diarrhea.  Some medicines, including medicines that make you urinate more (diuretics).  Certain diseases, such as kidney disease, diabetes, celiac disease, and overactive thyroid. What are the signs or symptoms? Symptoms of this condition include:  Loss of appetite.  Nausea and vomiting.  Involuntary shaking or trembling of a body part (tremor).  Muscle weakness.  Tingling in the arms and legs.  Sudden tightening of muscles (muscle spasms).  Confusion.  Psychiatric issues, such as depression, irritability, or psychosis.  A feeling of fluttering of the heart.  Seizures. These symptoms are more severe if magnesium levels drop suddenly. How is this diagnosed? This condition may be diagnosed based on:  Your symptoms and medical history.  A physical exam.  Blood and urine tests. How is this treated? Treatment depends on the cause and the severity of the condition. It may be treated with:  A magnesium supplement. This can be taken in pill form. If the condition is severe, magnesium is usually given through an IV.  Changes to your diet. You may be directed to eat foods that have a lot of magnesium, such as green leafy vegetables, peas, beans, and nuts.  Stopping any intake of alcohol.   Follow these instructions at home:  Make sure that your diet includes foods with magnesium. Foods that have a lot of magnesium in them  include: ? Green leafy vegetables, such as spinach and broccoli. ? Beans and peas. ? Nuts and seeds, such as almonds and sunflower seeds. ? Whole grains, such as whole grain bread and fortified cereals.  Take magnesium supplements if your health care provider tells you to do that. Take them as directed.  Take over-the-counter and prescription medicines only as told by your health care provider.  Have your magnesium levels monitored as told by your health care provider.  When you are active, drink fluids that contain electrolytes.  Avoid drinking alcohol.  Keep all follow-up visits as told by your health care provider. This is important.      Contact a health care provider if:  You get worse instead of better.  Your symptoms return. Get help right away if you:  Develop severe muscle weakness.  Have trouble breathing.  Feel that your heart is racing. Summary  Hypomagnesemia is a condition in which the level of magnesium in the blood is low.  Hypomagnesemia can affect every organ in the body.  Treatment may include eating more foods that contain magnesium, taking magnesium supplements, and not drinking alcohol.  Have your magnesium levels monitored as told by your health care provider. This information is not intended to replace advice given to you by your health care provider. Make sure you discuss any questions you have with your health care provider. Document Revised: 07/27/2019 Document Reviewed: 07/27/2019 Elsevier Patient Education  2021 [employer]  

## 2020-05-14 NOTE — Progress Notes (Signed)
[facility]   Telephone:([phone] Fax:([phone]) [phone]   Clinic Follow up Note   Patient Care Team: Lindsay Panda, MD as PCP - General (Internal Medicine) Lindsay Finner, RN as Oncology Nurse Navigator Lindsay Merle, MD as Consulting Physician (Oncology) Lindsay Ada, MD as Consulting Physician (Gastroenterology) 05/15/2020  CHIEF COMPLAINT: Follow up metastatic colon cancer   SUMMARY OF ONCOLOGIC HISTORY: Oncology History Overview Note  Cancer Staging No matching staging information was found for the patient.    metastatic colon cancer to liver  06/20/2019 Procedure   Colonoscopy by Dr Lindsay [name] 06/20/19  IMPRESSION -Seven 3 to 10 mm polyps in the sigmoid colon, in the transverse colon and in the escending colon, removed with a cold snare. Resected and retrireved.  -One 43m polyp in the descending colon. Biopsies. Tattoes.  -Mediaum sized lipoma in the ascending colon.   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 See Comment B. Colon, Ascending, polyp, Polypectomy:  -TUBULAR ADENOMA -No high grade dysplasia or malignancy.  C. Colon, TRansverse, Polyo, polectomy:  -TUBULAR ADENOMA -No high grade dysplasia or malignancy.  D. Colon, Sigmoid, Polyp, Polypectomy:  -HYPERPLASTIC POLYP   11/07/2019 Imaging   [place]oreaAbdomen 11/07/19  IMPRESSION: 1. Two solid masses in the liver are nonspecific. Recommend MRI abdomen with and without contrast for further evaluation.   12/15/2019 Imaging   MRI Abdomen 12/15/19  IMPRESSION: 1. There are two large masses in the liver with appearance favoring metastatic disease or hepatocellular carcinoma or cholangiocarcinoma. A benign etiology is highly unlikely given the enhancement pattern and associated adenopathy. 2. Considerable porta hepatis and retroperitoneal adenopathy. Some of the confluent porta hepatis tumor is potentially infiltrative  and abuts the pancreatic body along its right upper margin, making it difficult to completely exclude the possibility of pancreatic adenocarcinoma primary. Possibilities helpful in further workup might include tissue diagnosis, endoscopic ultrasound, or nuclear medicine PET-CT. 3. Pancreas divisum. 4. Lumbar spondylosis and degenerative disc disease. 5. Despite efforts by the technologist and patient, motion artifact is present on today's exam and could not be eliminated. This reduces exam sensitivity and specificity.   12/22/2019 Procedure   Upper Endoscopy by Dr Lindsay Norway10/15/21  IMPRESSION - One lymph node was visualized and measured in the porta hepatis region. Fine needle aspiration performed    12/22/2019 Initial Biopsy   A. LIVER, PORTA HEPATIS MASS, FINE NEEDLE 12/22/19 ASPIRATION:  FINAL MICROSCOPIC DIAGNOSIS:  - Malignant cells consistent with metastatic adenocarcinoma   01/01/2020 Initial Diagnosis   Intrahepatic cholangiocarcinoma (HBodega [name]   01/08/2020 Initial Biopsy   FINAL MICROSCOPIC DIAGNOSIS:   A. LIVER, LEFT LOBE, BIOPSY:  - Metastatic adenocarcinoma, consistent with a colorectal primary.  See  comment      COMMENT:   Immunohistochemical stains show the tumor cells are positive for CK20  and CDX2 but negative for CK7, consistent with above interpretation.  Dr. FBurr [name] [name] on 01/10/2020   01/08/2020 Genetic Testing   [name]  [name] wildtype and [name]/[name] mutations which make her eligible for target biological agent Vectibix.   01/24/2020 Procedure   PAC placed 01/24/20   01/25/2020 -  Chemotherapy   first line FOLFOX starting 01/25/2020, Vextibix  added with C2 (02/06/20)     CURRENT THERAPY: first line FOLFOX starting 01/25/2020, Vextibixadded with C2 (02/06/20)  INTERVAL HISTORY: Ms. FWallensteinreturns for follow up and treatment as scheduled. She completed another cycle of FOLFOX/vectibix on 04/29/20.  She continues tolerating treatment  well.  She has constipation on the day of treatment that resolved with prunes.  Denies nausea/vomiting or new or worsening abdominal pain.  Her mouth is occasionally sore without obvious blisters.  She is able to eat and drink, energy level remains adequate.  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  This is stable lately.  Skin rash limited mostly to her face, controlled with hydrocortisone and Clindagel.  Takes magnesium 3 times daily. Denies fever, chills, cough, chest pain, dyspnea, leg edema, or other new concerns.   MEDICAL HISTORY:  Past Medical History:  Diagnosis Date  . Arthritis   . Diabetes mellitus without complication ([place])   . Hypertension     SURGICAL HISTORY: Past Surgical History:  Procedure Laterality Date  . ABDOMINAL HYSTERECTOMY    . ANTERIOR AND POSTERIOR REPAIR WITH SACROSPINOUS FIXATION N/A 07/16/2016   Procedure: ANTERIOR AND POSTERIOR REPAIR WITH SACROSPINOUS FIXATION;  Surgeon: Lindsay Farrier, MD;  Location: [facility];  Service: Gynecology;  Laterality: N/A;  . CYSTOSCOPY  07/16/2016   Procedure: CYSTOSCOPY;  Surgeon: Lindsay Farrier, MD;  Location: [facility];  Service: Gynecology;;  . ESOPHAGOGASTRODUODENOSCOPY (EGD) WITH PROPOFOL N/A 12/22/2019   Procedure: ESOPHAGOGASTRODUODENOSCOPY (EGD) WITH PROPOFOL;  Surgeon: Lindsay Ada, MD;  Location: [facility];  Service: Endoscopy;  Laterality: N/A;  . FINE NEEDLE ASPIRATION N/A 12/22/2019   Procedure: FINE NEEDLE ASPIRATION (FNA) LINEAR;  Surgeon: Lindsay Ada, MD;  Location: [facility];  Service: Endoscopy;  Laterality: N/A;  . IR IMAGING GUIDED PORT INSERTION  01/24/2020  . LAPAROSCOPIC VAGINAL HYSTERECTOMY WITH SALPINGECTOMY Bilateral 07/16/2016   Procedure: LAPAROSCOPIC ASSISTED VAGINAL HYSTERECTOMY WITH SALPINGECTOMY;  Surgeon: Lindsay Farrier, MD;  Location: [facility];  Service: Gynecology;  Laterality: Bilateral;  .  MYOMECTOMY ABDOMINAL APPROACH    . THYROID SURGERY    . tyroid    . UPPER ESOPHAGEAL ENDOSCOPIC ULTRASOUND (EUS) N/A 12/22/2019   Procedure: UPPER ESOPHAGEAL ENDOSCOPIC ULTRASOUND (EUS);  Surgeon: Lindsay Ada, MD;  Location: [facility];  Service: Endoscopy;  Laterality: N/A;    I have reviewed the social history and family history with the patient and they are unchanged from previous note.  ALLERGIES:  has No Known Allergies.  MEDICATIONS:  Current Outpatient Medications  Medication Sig Dispense Refill  . aspirin EC 81 MG tablet Take 1 tablet (81 mg total) by mouth daily. 90 tablet 3  . atorvastatin (LIPITOR) 20 MG tablet Take 20 mg by mouth at bedtime.   1  . clindamycin (CLINDAGEL) 1 % gel Apply topically 2 (two) times daily. 60 g 2  . clobetasol cream (TEMOVATE) 3.15 % Apply 1 application topically daily.    Marland Kitchen doxycycline (VIBRA-TABS) 100 MG tablet Take 1 tablet (100 mg total) by mouth 2 (two) times daily. 60 tablet 1  . esomeprazole (NEXIUM) 40 MG capsule Take 1 capsule (40 mg total) by mouth daily. 30 capsule 2  . ferrous sulfate 325 (65 FE) MG tablet Take 1 tablet by mouth daily.    Marland Kitchen lidocaine-prilocaine (EMLA) cream Apply 1 application topically as needed. 30 g 1  . lisinopril-hydrochlorothiazide (ZESTORETIC) 20-12.5 MG tablet Take 1 tablet by mouth daily with breakfast.     . magnesium oxide (MAG-OX) 400 (241.3 Mg) MG tablet Take 1 tablet (400 mg total) by mouth in the morning, at noon, and at bedtime. 90 tablet 2  . magnesium oxide (MAG-OX) 400 MG tablet Take 1 tablet by mouth 2 (two) times daily.    Marland Kitchen  ondansetron (ZOFRAN) 8 MG tablet Take 1 tablet (8 mg total) by mouth every 8 (eight) hours as needed for nausea or vomiting. (Patient not taking: No sig reported) 20 tablet 1  . prochlorperazine (COMPAZINE) 10 MG tablet Take 1 tablet (10 mg total) by mouth every 6 (six) hours as needed for nausea or vomiting. 30 tablet 0  . spironolactone (ALDACTONE) 25 MG tablet Take 25 mg by  mouth daily with breakfast.    . traMADol (ULTRAM) 50 MG tablet Take 1 tablet (50 mg total) by mouth every 6 (six) hours as needed. 15 tablet 0   No current facility-administered medications for this visit.   Facility-Administered Medications Ordered in Other Visits  Medication Dose Route Frequency Provider Last Rate Last Admin  . dexamethasone (DECADRON) 10 mg in sodium chloride 0.9 % 50 mL IVPB  10 mg Intravenous Once Lindsay Merle, MD 204 mL/hr at 05/15/20 0947 10 mg at 05/15/20 0947  . fluorouracil (ADRUCIL) 3,950 mg in sodium chloride 0.9 % 71 mL chemo infusion  2,000 mg/m2 (Treatment Plan Recorded) Intravenous 1 day or 1 dose Lindsay Merle, MD      . heparin lock flush 100 unit/mL  500 Units Intracatheter Once PRN Lindsay Merle, MD      . leucovorin 792 mg in dextrose 5 % 250 mL infusion  400 mg/m2 (Treatment Plan Recorded) Intravenous Once Lindsay Merle, MD      . oxaliplatin (ELOXATIN) 120 mg in dextrose 5 % 500 mL chemo infusion  60 mg/m2 (Treatment Plan Recorded) Intravenous Once Lindsay Merle, MD      . sodium chloride flush (NS) 0.9 % injection 10 mL  10 mL Intracatheter PRN Lindsay Merle, MD        PHYSICAL EXAMINATION: ECOG PERFORMANCE STATUS: 1 - Symptomatic but completely ambulatory  Vitals:   05/15/20 0859  BP: (!) 144/98  Pulse: 71  Resp: 20  Temp: 98.1 F (36.7 C)  SpO2: 98%   Filed Weights   05/15/20 0859  Weight: 184 lb (83.5 kg)    GENERAL:alert, no distress and comfortable SKIN: Mild acne type skin rash to face.  Palms dry with hyperpigmentation  EYES:  sclera clear OROPHARYNX: Discolored tongue.  No thrush or ulcers LUNGS:  normal breathing effort HEART:  no lower extremity edema ABDOMEN: Deferred, no complaints NEURO: alert & oriented x 3 with fluent speech, no focal motor deficits PAC without erythema  LABORATORY DATA:  I have reviewed the data as listed CBC Latest Ref Rng & Units 05/15/2020 04/29/2020 04/16/2020  WBC 4.0 - 10.5 K/uL 3.7(L) 4.5 4.3  Hemoglobin 12.0 - 15.0  g/dL 11.8(L) 12.3 12.4  Hematocrit 36.0 - 46.0 % 34.4(L) 35.3(L) 35.6(L)  Platelets 150 - 400 K/uL 179 163 175     CMP Latest Ref Rng & Units 05/15/2020 04/29/2020 04/16/2020  Glucose 70 - 99 mg/dL 113(H) 107(H) 125(H)  BUN 8 - 23 mg/dL _0 Creatinine 0.44 - 1.00 mg/dL 0.68 0.69 0.66  Sodium 135 - 145 mmol/L 138 138 139  Potassium 3.5 - 5.1 mmol/L 3.8 3.9 3.6  Chloride 98 - 111 mmol/L 100 99 102  CO2 22 - 32 mmol/L _1 Calcium 8.9 - 10.3 mg/dL 9.7 9.1 9.1  Total Protein 6.5 - 8.1 g/dL 7.8 7.8 7.6  Total Bilirubin 0.3 - 1.2 mg/dL 0.4 0.3 0.3  Alkaline Phos 38 - 126 U/L 94 99 99  AST 15 - 41 U/L _2 ALT 0 - 44 U/L 26  24 27      RADIOGRAPHIC STUDIES: I have personally reviewed the radiological images as listed and agreed with the findings in the report. No results found.   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 MSS, [name]/[name] wildtype -Her 12/15/19 MRI abdomen showed 2 large liver masses indicating metastatic disease and bulkyporta hepatis and retroperitoneal adenopathy. -Her EUS from 12/22/19 showed 3.5cm LN in porta hepatis regionand biopsy confirmed metastatic adenocarcinoma.Preliminary cytology sample is not adequate for any additional IHC study for the origin of tumor, the morphology does not support HCC. -Her EGD was negative formalignancy. Her colonoscopy in March 2021 didshow a large 2 cm polyps in descending colon, biopsy showed high-grade dysplasia. -Liver biopsyconfirmed adenocarcinoma, immunostain showed positive CDX2 and CK20, negative CK7, supporting primary colorectalcancer  -PET scan showedknownliver metastasis, and diffuse thoracic and abdominaladenopathy.There isa hypermetabolic 1.8 cm pulmonary nodule in left upper lobe, and focal hypermetabolic lesion in the splenic fixture of the colon,concerning for primary tumor. -Sheunderwent repeat colonoscopy and biopsy  by Dr. Benson [name]; additional IHC testing with TTF-1 shows focal staining, which can be seen in 5-10% of colorectal primary cancers as well as lung cancer. However given the above, the overall picture is still consistent with colorectal primary. -She began first line systemic FOLFOX on 11/18, goal is palliative. Toleratingwell -FOshows [name]/[name] wild-type, she is a candidate for EGFR inhibitor Panitumumabwhich was added with C2 -Restaging CT 04/01/2020 showed significant interval reduction in size of liver, lung, and nodal metastases.  CEA normalized as of 04/16/2020  2. Chronic Lower abdominal pain, Nausea  -She has had chronic low abdominal pain for 5 years ranging up to 4 times a week. She notes her pain worsened in the last 3 years, but no work-up until 2021.  -Her pain is 5/10 at most but when high not managed on Motrin. -she has tramadol but does not take it much -encouraged her to use Ensure/boost if she has decreased appetite -f/up with dietician -abdominal pain improved on chemo.  Denies pain recently  3. HTN, DM, Heart Murmur, Arthritis  -PerPCP and Cardiologist Dr Einar Gip -DM controlled, no pre-existing neuropathy -We will monitor for elevated BG and neuropathy on chemo  4. 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  -She lives with husband and has brothers and sister in and out of town. Her only child passed in recent years.   5. Goals of care -we discussed the incurable but treatable nature of her stage IV cancer and general prognosis. She understands the prognosis is poor if she does not tolerate or respond well to chemo -she understands the goal is palliative  -full code   Disposition: Lindsay [name] appears stable.  She completed another cycle of FOLFOX and Panitumumab.  She continues to tolerate treatment well with mild constipation, skin rash, and cold sensitivity.  Side effects are well managed with supportive care at home.  She is able to  recover and function well.  There is no clinical evidence of disease progression.  Labs reviewed.  She has new mild anemia, likely chemo related; CBC and CMP otherwise stable.  We will check iron studies at next visit, in the meantime she will continue oral iron 1 tab daily.  Mag persistently low at 1.2, continue oral mag 3 times daily.  She will return for 4 g IV mag on 3/11 with pump DC.  CEA remains normal (since 04/16/20).  Proceed with FOLFOX and  Panitumumab today as planned.  If hypomagnesemia worsens will consider holding Panitumumab.  Follow-up in 2 weeks with next cycle.  Plan to restage after 1 or 2 more cycles.  Orders Placed This Encounter  Procedures  . Iron and TIBC    Standing Status:   Standing    Number of Occurrences:   1    Standing Expiration Date:   05/15/2021  . Ferritin    Standing Status:   Standing    Number of Occurrences:   1    Standing Expiration Date:   05/15/2021   All questions were answered. The patient knows to call the clinic with any problems, questions or concerns. No barriers to learning were detected.     Lindsay Feeling, NP 05/15/20

## 2020-05-15 ENCOUNTER — Inpatient Hospital Stay: Payer: Medicare Other | Attending: Hematology

## 2020-05-15 ENCOUNTER — Encounter: Payer: Self-pay | Admitting: Nurse Practitioner

## 2020-05-15 ENCOUNTER — Inpatient Hospital Stay: Payer: Medicare Other

## 2020-05-15 ENCOUNTER — Other Ambulatory Visit: Payer: Self-pay

## 2020-05-15 ENCOUNTER — Telehealth: Payer: Self-pay | Admitting: Nurse Practitioner

## 2020-05-15 ENCOUNTER — Inpatient Hospital Stay (HOSPITAL_BASED_OUTPATIENT_CLINIC_OR_DEPARTMENT_OTHER): Payer: Medicare Other | Admitting: Nurse Practitioner

## 2020-05-15 VITALS — BP 144/98 | HR 71 | Temp 98.1°F | Resp 20 | Ht 66.0 in | Wt 184.0 lb

## 2020-05-15 DIAGNOSIS — R011 Cardiac murmur, unspecified: Secondary | ICD-10-CM | POA: Insufficient documentation

## 2020-05-15 DIAGNOSIS — E119 Type 2 diabetes mellitus without complications: Secondary | ICD-10-CM | POA: Insufficient documentation

## 2020-05-15 DIAGNOSIS — Z95828 Presence of other vascular implants and grafts: Secondary | ICD-10-CM

## 2020-05-15 DIAGNOSIS — C221 Intrahepatic bile duct carcinoma: Secondary | ICD-10-CM

## 2020-05-15 DIAGNOSIS — C778 Secondary and unspecified malignant neoplasm of lymph nodes of multiple regions: Secondary | ICD-10-CM | POA: Diagnosis not present

## 2020-05-15 DIAGNOSIS — Z5111 Encounter for antineoplastic chemotherapy: Secondary | ICD-10-CM | POA: Insufficient documentation

## 2020-05-15 DIAGNOSIS — Z7984 Long term (current) use of oral hypoglycemic drugs: Secondary | ICD-10-CM | POA: Insufficient documentation

## 2020-05-15 DIAGNOSIS — Z86010 Personal history of colonic polyps: Secondary | ICD-10-CM | POA: Insufficient documentation

## 2020-05-15 DIAGNOSIS — C19 Malignant neoplasm of rectosigmoid junction: Secondary | ICD-10-CM | POA: Diagnosis not present

## 2020-05-15 DIAGNOSIS — I10 Essential (primary) hypertension: Secondary | ICD-10-CM | POA: Diagnosis not present

## 2020-05-15 DIAGNOSIS — Z7189 Other specified counseling: Secondary | ICD-10-CM

## 2020-05-15 DIAGNOSIS — Z87891 Personal history of nicotine dependence: Secondary | ICD-10-CM | POA: Diagnosis not present

## 2020-05-15 DIAGNOSIS — C787 Secondary malignant neoplasm of liver and intrahepatic bile duct: Secondary | ICD-10-CM | POA: Diagnosis present

## 2020-05-15 LAB — CBC WITH DIFFERENTIAL (CANCER CENTER ONLY)
Abs Immature Granulocytes: 0.01 10*3/uL (ref 0.00–0.07)
Basophils Absolute: 0 10*3/uL (ref 0.0–0.1)
Basophils Relative: 1 %
Eosinophils Absolute: 0.1 10*3/uL (ref 0.0–0.5)
Eosinophils Relative: 2 %
HCT: 34.4 % — ABNORMAL LOW (ref 36.0–46.0)
Hemoglobin: 11.8 g/dL — ABNORMAL LOW (ref 12.0–15.0)
Immature Granulocytes: 0 %
Lymphocytes Relative: 22 %
Lymphs Abs: 0.8 10*3/uL (ref 0.7–4.0)
MCH: 30.9 pg (ref 26.0–34.0)
MCHC: 34.3 g/dL (ref 30.0–36.0)
MCV: 90.1 fL (ref 80.0–100.0)
Monocytes Absolute: 0.5 10*3/uL (ref 0.1–1.0)
Monocytes Relative: 14 %
Neutro Abs: 2.3 10*3/uL (ref 1.7–7.7)
Neutrophils Relative %: 61 %
Platelet Count: 179 10*3/uL (ref 150–400)
RBC: 3.82 MIL/uL — ABNORMAL LOW (ref 3.87–5.11)
RDW: 16.7 % — ABNORMAL HIGH (ref 11.5–15.5)
WBC Count: 3.7 10*3/uL — ABNORMAL LOW (ref 4.0–10.5)
nRBC: 0 % (ref 0.0–0.2)

## 2020-05-15 LAB — CMP (CANCER CENTER ONLY)
ALT: 26 U/L (ref 0–44)
AST: 30 U/L (ref 15–41)
Albumin: 3.9 g/dL (ref 3.5–5.0)
Alkaline Phosphatase: 94 U/L (ref 38–126)
Anion gap: 11 (ref 5–15)
BUN: 13 mg/dL (ref 8–23)
CO2: 27 mmol/L (ref 22–32)
Calcium: 9.7 mg/dL (ref 8.9–10.3)
Chloride: 100 mmol/L (ref 98–111)
Creatinine: 0.68 mg/dL (ref 0.44–1.00)
GFR, Estimated: >60 mL/min (ref >60)
Glucose, Bld: 113 mg/dL — ABNORMAL HIGH (ref 70–99)
Potassium: 3.8 mmol/L (ref 3.5–5.1)
Sodium: 138 mmol/L (ref 135–145)
Total Bilirubin: 0.4 mg/dL (ref 0.3–1.2)
Total Protein: 7.8 g/dL (ref 6.5–8.1)

## 2020-05-15 LAB — CEA (IN HOUSE-CHCC): CEA (CHCC-In House): 1.73 ng/mL (ref 0.00–5.00)

## 2020-05-15 LAB — MAGNESIUM: Magnesium: 1.2 mg/dL — ABNORMAL LOW (ref 1.7–2.4)

## 2020-05-15 MED ORDER — SODIUM CHLORIDE 0.9 % IV SOLN
10.0000 mg | Freq: Once | INTRAVENOUS | Status: AC
  Administered 2020-05-15: 10 mg via INTRAVENOUS
  Filled 2020-05-15: qty 10

## 2020-05-15 MED ORDER — PALONOSETRON HCL INJECTION 0.25 MG/5ML
INTRAVENOUS | Status: AC
  Filled 2020-05-15: qty 5

## 2020-05-15 MED ORDER — SODIUM CHLORIDE 0.9% FLUSH
10.0000 mL | INTRAVENOUS | Status: DC | PRN
  Filled 2020-05-15: qty 10

## 2020-05-15 MED ORDER — PALONOSETRON HCL INJECTION 0.25 MG/5ML
0.2500 mg | Freq: Once | INTRAVENOUS | Status: AC
  Administered 2020-05-15: 0.25 mg via INTRAVENOUS

## 2020-05-15 MED ORDER — SODIUM CHLORIDE 0.9% FLUSH
10.0000 mL | Freq: Once | INTRAVENOUS | Status: AC
  Administered 2020-05-15: 10 mL
  Filled 2020-05-15: qty 10

## 2020-05-15 MED ORDER — HEPARIN SOD (PORK) LOCK FLUSH 100 UNIT/ML IV SOLN
500.0000 [IU] | Freq: Once | INTRAVENOUS | Status: DC | PRN
  Filled 2020-05-15: qty 5

## 2020-05-15 MED ORDER — OXALIPLATIN CHEMO INJECTION 100 MG/20ML
60.0000 mg/m2 | Freq: Once | INTRAVENOUS | Status: AC
  Administered 2020-05-15: 120 mg via INTRAVENOUS
  Filled 2020-05-15: qty 10

## 2020-05-15 MED ORDER — LEUCOVORIN CALCIUM INJECTION 350 MG
400.0000 mg/m2 | Freq: Once | INTRAVENOUS | Status: AC
  Administered 2020-05-15: 792 mg via INTRAVENOUS
  Filled 2020-05-15: qty 39.6

## 2020-05-15 MED ORDER — SODIUM CHLORIDE 0.9 % IV SOLN
2000.0000 mg/m2 | INTRAVENOUS | Status: DC
  Administered 2020-05-15: 3950 mg via INTRAVENOUS
  Filled 2020-05-15: qty 79

## 2020-05-15 MED ORDER — DEXTROSE 5 % IV SOLN
Freq: Once | INTRAVENOUS | Status: AC
  Filled 2020-05-15: qty 250

## 2020-05-15 NOTE — Telephone Encounter (Signed)
Scheduled appt per 3/9 los - pt to get an updated schedule next visit/

## 2020-05-15 NOTE — Patient Instructions (Signed)

## 2020-05-15 NOTE — Patient Instructions (Signed)
[facility] Discharge Instructions for Patients Receiving Chemotherapy  Today you received the following chemotherapy agents: Oxaliplatin, leucovorin, 5FU   To help prevent nausea and vomiting after your treatment, we encourage you to take your nausea medication as directed.    If you develop nausea and vomiting that is not controlled by your nausea medication, call the clinic.   BELOW ARE SYMPTOMS THAT SHOULD BE REPORTED IMMEDIATELY:  *FEVER GREATER THAN 100.5 F  *CHILLS WITH OR WITHOUT FEVER  NAUSEA AND VOMITING THAT IS NOT CONTROLLED WITH YOUR NAUSEA MEDICATION  *UNUSUAL SHORTNESS OF BREATH  *UNUSUAL BRUISING OR BLEEDING  TENDERNESS IN MOUTH AND THROAT WITH OR WITHOUT PRESENCE OF ULCERS  *URINARY PROBLEMS  *BOWEL PROBLEMS  UNUSUAL RASH Items with * indicate a potential emergency and should be followed up as soon as possible.  Feel free to call the clinic should you have any questions or concerns. The clinic phone number is [phone].  Please show the CHEMO ALERT CARD at check-in to the Emergency Department and triage nurse.   

## 2020-05-17 ENCOUNTER — Other Ambulatory Visit: Payer: Self-pay

## 2020-05-17 ENCOUNTER — Inpatient Hospital Stay: Payer: Medicare Other

## 2020-05-17 VITALS — BP 129/76 | HR 74 | Temp 98.9°F | Resp 20

## 2020-05-17 DIAGNOSIS — C221 Intrahepatic bile duct carcinoma: Secondary | ICD-10-CM

## 2020-05-17 DIAGNOSIS — Z5111 Encounter for antineoplastic chemotherapy: Secondary | ICD-10-CM | POA: Diagnosis not present

## 2020-05-17 DIAGNOSIS — Z95828 Presence of other vascular implants and grafts: Secondary | ICD-10-CM

## 2020-05-17 MED ORDER — SODIUM CHLORIDE 0.9% FLUSH
10.0000 mL | INTRAVENOUS | Status: DC | PRN
  Administered 2020-05-17: 10 mL
  Filled 2020-05-17: qty 10

## 2020-05-17 MED ORDER — HEPARIN SOD (PORK) LOCK FLUSH 100 UNIT/ML IV SOLN
500.0000 [IU] | Freq: Once | INTRAVENOUS | Status: AC | PRN
Start: 2020-05-17 — End: 2020-05-17
  Administered 2020-05-17: 500 [IU]
  Filled 2020-05-17: qty 5

## 2020-05-17 MED ORDER — MAGNESIUM SULFATE 4 GM/100ML IV SOLN
4.0000 g | Freq: Once | INTRAVENOUS | Status: AC
  Administered 2020-05-17: 4 g via INTRAVENOUS
  Filled 2020-05-17: qty 100

## 2020-05-17 MED ORDER — SODIUM CHLORIDE 0.9 % IV SOLN
Freq: Once | INTRAVENOUS | Status: AC
  Filled 2020-05-17: qty 250

## 2020-05-17 MED ORDER — SODIUM CHLORIDE 0.9% FLUSH
10.0000 mL | Freq: Once | INTRAVENOUS | Status: AC | PRN
  Administered 2020-05-17: 10 mL
  Filled 2020-05-17: qty 10

## 2020-05-17 NOTE — Patient Instructions (Signed)
Magnesium Sulfate injection What is this medicine? MAGNESIUM SULFATE (mag NEE zee um SUL fate) is an electrolyte injection commonly used to treat low magnesium levels in your blood. It is also used to prevent or control seizures in women with preeclampsia or eclampsia. This medicine may be used for other purposes; ask your health care provider or pharmacist if you have questions. What should I tell my health care provider before I take this medicine? They need to know if you have any of these conditions:  heart disease  history of irregular heart beat  kidney disease  an unusual or allergic reaction to magnesium sulfate, medicines, foods, dyes, or preservatives  pregnant or trying to get pregnant  breast-feeding How should I use this medicine? This medicine is for infusion into a vein. It is given by a health care professional in a hospital or clinic setting. Talk to your pediatrician regarding the use of this medicine in children. While this drug may be prescribed for selected conditions, precautions do apply. Overdosage: If you think you have taken too much of this medicine contact a poison control center or emergency room at once. NOTE: This medicine is only for you. Do not share this medicine with others. What if I miss a dose? This does not apply. What may interact with this medicine? This medicine may interact with the following medications:  certain medicines for anxiety or sleep  certain medicines for seizures like phenobarbital  digoxin  medicines that relax muscles for surgery  narcotic medicines for pain This list may not describe all possible interactions. Give your health care provider a list of all the medicines, herbs, non-prescription drugs, or dietary supplements you use. Also tell them if you smoke, drink alcohol, or use illegal drugs. Some items may interact with your medicine. What should I watch for while using this medicine? Your condition will be  monitored carefully while you are receiving this medicine. You may need blood work done while you are receiving this medicine. What side effects may I notice from receiving this medicine? Side effects that you should report to your doctor or health care professional as soon as possible:  allergic reactions like skin rash, itching or hives, swelling of the face, lips, or tongue  facial flushing  muscle weakness  signs and symptoms of low blood pressure like dizziness; feeling faint or lightheaded, falls; unusually weak or tired  signs and symptoms of a dangerous change in heartbeat or heart rhythm like chest pain; dizziness; fast or irregular heartbeat; palpitations; breathing problems  sweating This list may not describe all possible side effects. Call your doctor for medical advice about side effects. You may report side effects to FDA at 1-800-FDA-1088. Where should I keep my medicine? This drug is given in a hospital or clinic and will not be stored at home. NOTE: This sheet is a summary. It may not cover all possible information. If you have questions about this medicine, talk to your doctor, pharmacist, or health care provider.  2021 Elsevier/Gold Standard (2015-09-11 12:31:42)  

## 2020-05-17 NOTE — Patient Instructions (Signed)

## 2020-05-24 NOTE — Progress Notes (Signed)
[facility]   [phone] [phone]   Clinic Follow up Note   Patient Care Team: [name], MD as PCP - General (Internal Medicine) [name], RN as Oncology Nurse Navigator [name], MD as Consulting Physician (Oncology) [name], MD as Consulting Physician (Gastroenterology)  Date of Service:  05/29/2020  CHIEF COMPLAINT: Follow-up metastatic colon cancer  SUMMARY OF ONCOLOGIC HISTORY: Oncology History Overview Note  Cancer Staging No matching staging information was found for the patient.    metastatic colon cancer to liver  06/20/2019 Procedure   Colonoscopy by Dr [name] 06/20/19  IMPRESSION -Seven 3 to 10 mm polyps in the sigmoid colon, in the transverse colon and in the escending colon, removed with a cold snare. Resected and retrireved.  -One 84m polyp in the descending colon. Biopsies. Tattoes.  -Mediaum sized lipoma in the ascending colon.   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 See Comment B. Colon, Ascending, polyp, Polypectomy:  -TUBULAR ADENOMA -No high grade dysplasia or malignancy.  C. Colon, TRansverse, Polyo, polectomy:  -TUBULAR ADENOMA -No high grade dysplasia or malignancy.  D. Colon, Sigmoid, Polyp, Polypectomy:  -HYPERPLASTIC POLYP   11/07/2019 Imaging   [place]domen 11/07/19  IMPRESSION: 1. Two solid masses in the liver are nonspecific. Recommend MRI abdomen with and without contrast for further evaluation.   12/15/2019 Imaging   MRI Abdomen 12/15/19  IMPRESSION: 1. There are two large masses in the liver with appearance favoring metastatic disease or hepatocellular carcinoma or cholangiocarcinoma. A benign etiology is highly unlikely given the enhancement pattern and associated adenopathy. 2. Considerable porta hepatis and retroperitoneal adenopathy. Some of the confluent porta hepatis tumor is  potentially infiltrative and abuts the pancreatic body along its right upper margin, making it difficult to completely exclude the possibility of pancreatic adenocarcinoma primary. Possibilities helpful in further workup might include tissue diagnosis, endoscopic ultrasound, or nuclear medicine PET-CT. 3. Pancreas divisum. 4. Lumbar spondylosis and degenerative disc disease. 5. Despite efforts by the technologist and patient, motion artifact is present on today's exam and could not be eliminated. This reduces exam sensitivity and specificity.   12/22/2019 Procedure   Upper Endoscopy by Dr [name]10/15/21  IMPRESSION - One lymph node was visualized and measured in the porta hepatis region. Fine needle aspiration performed    12/22/2019 Initial Biopsy   A. LIVER, PORTA HEPATIS MASS, FINE NEEDLE 12/22/19 ASPIRATION:  FINAL MICROSCOPIC DIAGNOSIS:  - Malignant cells consistent with metastatic adenocarcinoma   01/01/2020 Initial Diagnosis   Intrahepatic cholangiocarcinoma ([facility]   01/08/2020 Initial Biopsy   FINAL MICROSCOPIC DIAGNOSIS:   A. LIVER, LEFT LOBE, BIOPSY:  - Metastatic adenocarcinoma, consistent with a colorectal primary.  See  comment      COMMENT:   Immunohistochemical stains show the tumor cells are positive for CK20  and CDX2 but negative for CK7, consistent with above interpretation.  Dr. [name] notified on 01/10/2020   01/08/2020 Genetic Testing   [name]  [name] wildtype and [name]/[name] mutations which make her eligible for target biological agent Vectibix.   01/24/2020 Procedure   PAC placed 01/24/20   01/25/2020 -  Chemotherapy   first line FOLFOX starting 01/25/2020, Vextibix  added with C2 (02/06/20)      CURRENT THERAPY:  first line FOLFOX starting 01/25/2020, Vextibixadded with C2 (02/06/20)  INTERVAL HISTORY:  Lindsay YETTERis here for a follow up. She presents to the clinic alone. She notes  she is doing well. She denies any new issues  from chemo other than skin darkening. She notes cold sensitivity but no lasting neuropathy. She notes she has been constipated lately. She required an enema. She has taken ducalax and Mralax without much help. I reviewed medication list with her. She notes she only take tramadol as needed, not daily.     REVIEW OF SYSTEMS:   Constitutional: Denies fevers, chills or abnormal weight loss Eyes: Denies blurriness of vision Ears, nose, mouth, throat, and face: Denies mucositis or sore throat Respiratory: Denies cough, dyspnea or wheezes Cardiovascular: Denies palpitation, chest discomfort or lower extremity swelling Gastrointestinal:  Denies nausea, heartburn or change in bowel habits Skin: Denies abnormal skin rashes (+) Skin darkening  Lymphatics: Denies new lymphadenopathy or easy bruising Neurological:Denies numbness, tingling or new weaknesses (+) Cold sensitivity  Behavioral/Psych: Mood is stable, no new changes  All other systems were reviewed with the patient and are negative.  MEDICAL HISTORY:  Past Medical History:  Diagnosis Date  . Arthritis   . Diabetes mellitus without complication ([place])   . Hypertension     SURGICAL HISTORY: Past Surgical History:  Procedure Laterality Date  . ABDOMINAL HYSTERECTOMY    . ANTERIOR AND POSTERIOR REPAIR WITH SACROSPINOUS FIXATION N/A 07/16/2016   Procedure: ANTERIOR AND POSTERIOR REPAIR WITH SACROSPINOUS FIXATION;  Surgeon: Everlene Farrier, MD;  Location: [facility];  Service: Gynecology;  Laterality: N/A;  . CYSTOSCOPY  07/16/2016   Procedure: CYSTOSCOPY;  Surgeon: Everlene Farrier, MD;  Location: [facility];  Service: Gynecology;;  . ESOPHAGOGASTRODUODENOSCOPY (EGD) WITH PROPOFOL N/A 12/22/2019   Procedure: ESOPHAGOGASTRODUODENOSCOPY (EGD) WITH PROPOFOL;  Surgeon: [name], MD;  Location: [facility];  Service: Endoscopy;  Laterality: N/A;  . FINE NEEDLE ASPIRATION N/A 12/22/2019   Procedure: FINE NEEDLE ASPIRATION (FNA) LINEAR;  Surgeon: [name], MD;  Location: [facility];  Service: Endoscopy;  Laterality: N/A;  . IR IMAGING GUIDED PORT INSERTION  01/24/2020  . LAPAROSCOPIC VAGINAL HYSTERECTOMY WITH SALPINGECTOMY Bilateral 07/16/2016   Procedure: LAPAROSCOPIC ASSISTED VAGINAL HYSTERECTOMY WITH SALPINGECTOMY;  Surgeon: Everlene Farrier, MD;  Location: [facility];  Service: Gynecology;  Laterality: Bilateral;  . MYOMECTOMY ABDOMINAL APPROACH    . THYROID SURGERY    . tyroid    . UPPER ESOPHAGEAL ENDOSCOPIC ULTRASOUND (EUS) N/A 12/22/2019   Procedure: UPPER ESOPHAGEAL ENDOSCOPIC ULTRASOUND (EUS);  Surgeon: [name], MD;  Location: [facility];  Service: Endoscopy;  Laterality: N/A;    I have reviewed the social history and family history with the patient and they are unchanged from previous note.  ALLERGIES:  has No Known Allergies.  MEDICATIONS:  Current Outpatient Medications  Medication Sig Dispense Refill  . aspirin EC 81 MG tablet Take 1 tablet (81 mg total) by mouth daily. 90 tablet 3  . atorvastatin (LIPITOR) 20 MG tablet Take 20 mg by mouth at bedtime.   1  . clindamycin (CLINDAGEL) 1 % gel Apply topically 2 (two) times daily. 60 g 2  . clobetasol cream (TEMOVATE) 4.53 % Apply 1 application topically daily.    Marland Kitchen doxycycline (VIBRA-TABS) 100 MG tablet Take 1 tablet (100 mg total) by mouth 2 (two) times daily. 60 tablet 1  . esomeprazole (NEXIUM) 40 MG capsule Take 1 capsule (40 mg total) by mouth daily. 30 capsule 2  . ferrous sulfate 325 (65 FE) MG tablet Take 1 tablet by mouth daily.    Marland Kitchen lidocaine-prilocaine (EMLA) cream Apply 1 application topically as needed. 30 g 1  . lisinopril-hydrochlorothiazide (ZESTORETIC) 20-12.5  MG tablet Take 1 tablet by mouth daily with breakfast.     . magnesium oxide (MAG-OX) 400 (241.3 Mg) MG tablet Take 1 tablet (400 mg total) by mouth in the morning, at noon, and at bedtime. 90 tablet 2  . magnesium oxide (MAG-OX) 400 MG tablet Take 1 tablet by mouth 2 (two) times daily.    .  ondansetron (ZOFRAN) 8 MG tablet Take 1 tablet (8 mg total) by mouth every 8 (eight) hours as needed for nausea or vomiting. (Patient not taking: No sig reported) 20 tablet 1  . prochlorperazine (COMPAZINE) 10 MG tablet Take 1 tablet (10 mg total) by mouth every 6 (six) hours as needed for nausea or vomiting. 30 tablet 0  . spironolactone (ALDACTONE) 25 MG tablet Take 25 mg by mouth daily with breakfast.    . traMADol (ULTRAM) 50 MG tablet Take 1 tablet (50 mg total) by mouth every 6 (six) hours as needed. 15 tablet 0   No current facility-administered medications for this visit.   Facility-Administered Medications Ordered in Other Visits  Medication Dose Route Frequency Provider Last Rate Last Admin  . fluorouracil (ADRUCIL) 3,550 mg in sodium chloride 0.9 % 79 mL chemo infusion  1,800 mg/m2 (Treatment Plan Recorded) Intravenous 1 day or 1 dose [name], MD      . magnesium sulfate IVPB 4 g 100 mL  4 g Intravenous Once [name], MD 50 mL/hr at 05/29/20 1330 4 g at 05/29/20 1330    PHYSICAL EXAMINATION: ECOG PERFORMANCE STATUS: 1 - Symptomatic but completely ambulatory  Vitals:   05/29/20 0858  BP: (!) 164/84  Pulse: 85  Resp: 19  Temp: (!) 97.2 F (36.2 C)   Filed Weights   05/29/20 0858  Weight: 183 lb 1.6 oz (83.1 kg)    Due to COVID19 we will limit examination to appearance. Patient had no complaints.  GENERAL:alert, no distress and comfortable SKIN: skin color normal, no rashes or significant lesions (+) Skin dryness, darkened skin EYES: normal, Conjunctiva are pink and non-injected, sclera clear  NEURO: alert & oriented x 3 with fluent speech   LABORATORY DATA:  I have reviewed the data as listed CBC Latest Ref Rng & Units 05/29/2020 05/15/2020 04/29/2020  WBC 4.0 - 10.5 K/uL 3.5(L) 3.7(L) 4.5  Hemoglobin 12.0 - 15.0 g/dL 11.3(L) 11.8(L) 12.3  Hematocrit 36.0 - 46.0 % 32.9(L) 34.4(L) 35.3(L)  Platelets 150 - 400 K/uL 140(L) 179 163     CMP Latest Ref Rng & Units  05/29/2020 05/15/2020 04/29/2020  Glucose 70 - 99 mg/dL 113(H) 113(H) 107(H)  BUN 8 - 23 mg/dL _0 Creatinine 0.44 - 1.00 mg/dL 0.66 0.68 0.69  Sodium 135 - 145 mmol/L 138 138 138  Potassium 3.5 - 5.1 mmol/L 3.7 3.8 3.9  Chloride 98 - 111 mmol/L 100 100 99  CO2 22 - 32 mmol/L _1 Calcium 8.9 - 10.3 mg/dL 9.3 9.7 9.1  Total Protein 6.5 - 8.1 g/dL 7.6 7.8 7.8  Total Bilirubin 0.3 - 1.2 mg/dL 0.3 0.4 0.3  Alkaline Phos 38 - 126 U/L 92 94 99  AST 15 - 41 U/L 33 30 28  ALT 0 - 44 U/L _2 RADIOGRAPHIC STUDIES: I have personally reviewed the radiological images as listed and agreed with the findings in the report. No results found.   ASSESSMENT & PLAN:  Lindsay [name] is a 65 y.o. female with   1.Colon cancer metastatictoliver, nodes  and lung, MSS, [name]/[name] wild type -Her 12/15/19 MRI abdomen showed 2 large liver masses indicating metastatic disease and bulkyporta hepatis and retroperitoneal adenopathy. -Her EUS from 12/22/19 showed 3.5cm LN in porta hepatis regionand biopsy confirmed metastatic adenocarcinoma.Herliver biopsy results confirmed adenocarcinoma, immunostain showed positive CDX2 and CK20, negative CK7, supporting primary colorectalcancer.  -Initial PET from 01/12/20 showedknownliver metastasis, and diffuse adenopathy in thoracic and abdomen. There isa hypermetabolic 1.8 cm pulmonary nodule in left upper lobe, and focal hypermetabolic lesion in the splenic fixture of the colon,concerning for primary tumor. -Sheunderwent repeat colonoscopy and biopsy by Dr. Laruth Bouchard 01/2020. The overall picture is still consistent with colorectal primary. -Although her cancer is not curable at this stage, it is still treatable. I started her on first-line FOLFOX q2weeks on 01/25/20.Vectibix was added with C2. -S/p C9 she continues to tolerate treatment. She has no lasting neuropathy outside of cold sensitivity. She has been constipated recently. She also has skin  cracking and mouth sores. I reviewed management with her.  -Labs reviewed, Hg 11.3, PLt 140K. Overall adequate to proceed with C10 FOLFOX and Vectibix today with 5FU dose reduction.  -Plan to scan her in 3-4 weeks. If she has had good response then will switch to maintenance Xeloda and Vectibix.  -F/u in 2 weeks.    2. Symptom Management: Acne skin rash, Skin toxicity, Constipation  -S/p C4 she has increased skin toxicity from5FU. Dose reduced starting with C5.I encouraged her to keep her skin moisturized and clean.I also suggested cotton gloves. -Her acne skin rash form Vectibix has been isolate to her face and mild-moderate. She can continue clindamycin and Hydrocortisone.  -She also has dry skin leading to cracking. I discussed keeping hands, clean and moisturized. She may use gloves as well.  -For mouth soreness, she continues mouth rinse daily. If not enough I will call in Magic mouth wash -I reviewed constipation management with Senakot-S (given miralax and ducolax has not helped). She can use Milk of Magnesium half bottle at a time for unresolved constipation.    3. Mild Neuropathy G1 -S/p C5 she has intermittent mild neuropathy with numbness in her fingertips only. -Oxaliplatin dose reduced from C6. Will continue to monitor. -She has not had lasting neuropathy lately, beyond cold sensitivity.   4. Chronic Lower abdominal pain, Nausea, secondary to #1 -She has had chronic low abdominal pain for 5 years ranging up to 4 times a week. She notes her pain worsened in the last 3 years, but no work-up until 2021.  -For pain she is now onTramadol for severe pain.She can otherwise she Tylenol -For her intermittent nauseashe hasZofran -I discussed to maintain weight she can continue Ensure with high protein/high calorie diet and remain active.Continue to f/u with dietician.Weight is stable.  5. HTN, DM, Heart Murmur, Arthritis  -On medications. Managed by her PCP and  Cardiologist Dr Einar Gip  6. 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  -She lives with husband and has brothers and sister in and out of town. Her only child passed in recent years.  -She notes her husband is aware of her condition and treatment, but she keeps the information to a minimum as she feels he may not handle it well.  7. Goals of care -she understands the goal is palliative -She is full code.   8. Hypomagnesia secondary to Vectibix  -she has been on oral and iv magnesium replacement    PLAN: -Labs reviewed and adequate to proceed with C10FOLFOXand Vectibix  today with 5FU dose reduction to 187m/m2 due to mucositis and skin crackling  -Lab, flush,f/u and FOLFOX and Vectibix in 2, 4weeks -CT CAP w contrast in 3-4 weeks, a few days before office visit. -will call in magic mouth wash for her     No problem-specific Assessment & Plan notes found for this encounter.   Orders Placed This Encounter  Procedures  . CT CHEST ABDOMEN PELVIS W CONTRAST    Standing Status:   Future    Standing Expiration Date:   05/29/2021    Order Specific Question:   If indicated for the ordered procedure, I authorize the administration of contrast media per Radiology protocol    Answer:   Yes    Order Specific Question:   [facility]    [facility]   Order Specific Question:   Release to patient    Answer:   Immediate    Order Specific Question:   Is Oral Contrast requested for this exam?    Answer:   Yes, Per Radiology protocol    Order Specific Question:   Reason for Exam (SYMPTOM  OR DIAGNOSIS REQUIRED)    Answer:   Evaluate response to chemo   All questions were answered. The patient knows to call the clinic with any problems, questions or concerns. No barriers to learning was detected. The total time spent in the appointment was 30 minutes.     YTruitt Merle MD 05/29/2020   I, AJoslyn Devon am acting as  scribe for YTruitt Merle MD.   I have reviewed the above documentation for accuracy and completeness, and I agree with the above.

## 2020-05-29 ENCOUNTER — Inpatient Hospital Stay (HOSPITAL_BASED_OUTPATIENT_CLINIC_OR_DEPARTMENT_OTHER): Payer: Medicare Other | Admitting: Hematology

## 2020-05-29 ENCOUNTER — Telehealth: Payer: Self-pay | Admitting: Hematology

## 2020-05-29 ENCOUNTER — Inpatient Hospital Stay: Payer: Medicare Other

## 2020-05-29 ENCOUNTER — Encounter: Payer: Self-pay | Admitting: Hematology

## 2020-05-29 ENCOUNTER — Other Ambulatory Visit: Payer: Self-pay

## 2020-05-29 VITALS — BP 124/67 | HR 68

## 2020-05-29 VITALS — BP 164/84 | HR 85 | Temp 97.2°F | Resp 19 | Ht 66.0 in | Wt 183.1 lb

## 2020-05-29 DIAGNOSIS — Z95828 Presence of other vascular implants and grafts: Secondary | ICD-10-CM

## 2020-05-29 DIAGNOSIS — C221 Intrahepatic bile duct carcinoma: Secondary | ICD-10-CM

## 2020-05-29 DIAGNOSIS — K1379 Other lesions of oral mucosa: Secondary | ICD-10-CM

## 2020-05-29 DIAGNOSIS — Z5111 Encounter for antineoplastic chemotherapy: Secondary | ICD-10-CM | POA: Diagnosis not present

## 2020-05-29 DIAGNOSIS — Z7189 Other specified counseling: Secondary | ICD-10-CM

## 2020-05-29 LAB — CBC WITH DIFFERENTIAL (CANCER CENTER ONLY)
Abs Immature Granulocytes: 0.02 10*3/uL (ref 0.00–0.07)
Basophils Absolute: 0 10*3/uL (ref 0.0–0.1)
Basophils Relative: 0 %
Eosinophils Absolute: 0.1 10*3/uL (ref 0.0–0.5)
Eosinophils Relative: 2 %
HCT: 32.9 % — ABNORMAL LOW (ref 36.0–46.0)
Hemoglobin: 11.3 g/dL — ABNORMAL LOW (ref 12.0–15.0)
Immature Granulocytes: 1 %
Lymphocytes Relative: 23 %
Lymphs Abs: 0.8 10*3/uL (ref 0.7–4.0)
MCH: 31.3 pg (ref 26.0–34.0)
MCHC: 34.3 g/dL (ref 30.0–36.0)
MCV: 91.1 fL (ref 80.0–100.0)
Monocytes Absolute: 0.6 10*3/uL (ref 0.1–1.0)
Monocytes Relative: 16 %
Neutro Abs: 2 10*3/uL (ref 1.7–7.7)
Neutrophils Relative %: 58 %
Platelet Count: 140 10*3/uL — ABNORMAL LOW (ref 150–400)
RBC: 3.61 MIL/uL — ABNORMAL LOW (ref 3.87–5.11)
RDW: 15.9 % — ABNORMAL HIGH (ref 11.5–15.5)
WBC Count: 3.5 10*3/uL — ABNORMAL LOW (ref 4.0–10.5)
nRBC: 0 % (ref 0.0–0.2)

## 2020-05-29 LAB — CMP (CANCER CENTER ONLY)
ALT: 31 U/L (ref 0–44)
AST: 33 U/L (ref 15–41)
Albumin: 3.8 g/dL (ref 3.5–5.0)
Alkaline Phosphatase: 92 U/L (ref 38–126)
Anion gap: 12 (ref 5–15)
BUN: 11 mg/dL (ref 8–23)
CO2: 26 mmol/L (ref 22–32)
Calcium: 9.3 mg/dL (ref 8.9–10.3)
Chloride: 100 mmol/L (ref 98–111)
Creatinine: 0.66 mg/dL (ref 0.44–1.00)
GFR, Estimated: >60 mL/min (ref >60)
Glucose, Bld: 113 mg/dL — ABNORMAL HIGH (ref 70–99)
Potassium: 3.7 mmol/L (ref 3.5–5.1)
Sodium: 138 mmol/L (ref 135–145)
Total Bilirubin: 0.3 mg/dL (ref 0.3–1.2)
Total Protein: 7.6 g/dL (ref 6.5–8.1)

## 2020-05-29 LAB — FERRITIN: Ferritin: 1217 ng/mL — ABNORMAL HIGH (ref 11–307)

## 2020-05-29 LAB — IRON AND TIBC
Iron: 87 ug/dL (ref 41–142)
Saturation Ratios: 31 % (ref 21–57)
TIBC: 278 ug/dL (ref 236–444)
UIBC: 191 ug/dL (ref 120–384)

## 2020-05-29 LAB — MAGNESIUM: Magnesium: 1.4 mg/dL — ABNORMAL LOW (ref 1.7–2.4)

## 2020-05-29 MED ORDER — PALONOSETRON HCL INJECTION 0.25 MG/5ML
0.2500 mg | Freq: Once | INTRAVENOUS | Status: AC
  Administered 2020-05-29: 0.25 mg via INTRAVENOUS

## 2020-05-29 MED ORDER — LEUCOVORIN CALCIUM INJECTION 350 MG
400.0000 mg/m2 | Freq: Once | INTRAVENOUS | Status: AC
  Administered 2020-05-29: 792 mg via INTRAVENOUS
  Filled 2020-05-29: qty 39.6

## 2020-05-29 MED ORDER — DEXTROSE 5 % IV SOLN
60.0000 mg/m2 | Freq: Once | INTRAVENOUS | Status: AC
  Administered 2020-05-29: 120 mg via INTRAVENOUS
  Filled 2020-05-29: qty 20

## 2020-05-29 MED ORDER — MAGIC MOUTHWASH W/LIDOCAINE: 5.0000 mL | Freq: Four times a day (QID) | ORAL | 2 refills | Status: DC

## 2020-05-29 MED ORDER — SODIUM CHLORIDE 0.9 % IV SOLN
10.0000 mg | Freq: Once | INTRAVENOUS | Status: AC
  Administered 2020-05-29: 10 mg via INTRAVENOUS
  Filled 2020-05-29: qty 10

## 2020-05-29 MED ORDER — SODIUM CHLORIDE 0.9 % IV SOLN
Freq: Once | INTRAVENOUS | Status: AC
  Filled 2020-05-29: qty 250

## 2020-05-29 MED ORDER — PALONOSETRON HCL INJECTION 0.25 MG/5ML
INTRAVENOUS | Status: AC
  Filled 2020-05-29: qty 5

## 2020-05-29 MED ORDER — DEXTROSE 5 % IV SOLN
Freq: Once | INTRAVENOUS | Status: AC
  Filled 2020-05-29: qty 250

## 2020-05-29 MED ORDER — SODIUM CHLORIDE 0.9% FLUSH
10.0000 mL | Freq: Once | INTRAVENOUS | Status: AC
  Administered 2020-05-29: 10 mL
  Filled 2020-05-29: qty 10

## 2020-05-29 MED ORDER — SODIUM CHLORIDE 0.9 % IV SOLN
6.0000 mg/kg | Freq: Once | INTRAVENOUS | Status: AC
  Administered 2020-05-29: 500 mg via INTRAVENOUS
  Filled 2020-05-29: qty 20

## 2020-05-29 MED ORDER — SODIUM CHLORIDE 0.9 % IV SOLN
1800.0000 mg/m2 | INTRAVENOUS | Status: DC
  Administered 2020-05-29: 3550 mg via INTRAVENOUS
  Filled 2020-05-29: qty 71

## 2020-05-29 MED ORDER — MAGNESIUM SULFATE 4 GM/100ML IV SOLN
4.0000 g | Freq: Once | INTRAVENOUS | Status: AC
  Administered 2020-05-29: 4 g via INTRAVENOUS
  Filled 2020-05-29: qty 100

## 2020-05-29 NOTE — Patient Instructions (Signed)
Implanted Port Insertion, Care After This sheet gives you information about how to care for yourself after your procedure. Your health care provider may also give you more specific instructions. If you have problems or questions, contact your health care provider. What can I expect after the procedure? After the procedure, it is common to have:  Discomfort at the port insertion site.  Bruising on the skin over the port. This should improve over 3-4 days. Follow these instructions at home: Port care  After your port is placed, you will get a manufacturer's information card. The card has information about your port. Keep this card with you at all times.  Take care of the port as told by your health care provider. Ask your health care provider if you or a family member can get training for taking care of the port at home. A home health care nurse may also take care of the port.  Make sure to remember what type of port you have. Incision care  Follow instructions from your health care provider about how to take care of your port insertion site. Make sure you: ? Wash your hands with soap and water before and after you change your bandage (dressing). If soap and water are not available, use hand sanitizer. ? Change your dressing as told by your health care provider. ? Leave stitches (sutures), skin glue, or adhesive strips in place. These skin closures may need to stay in place for 2 weeks or longer. If adhesive strip edges start to loosen and curl up, you may trim the loose edges. Do not remove adhesive strips completely unless your health care provider tells you to do that.  Check your port insertion site every day for signs of infection. Check for: ? Redness, swelling, or pain. ? Fluid or blood. ? Warmth. ? Pus or a bad smell.      Activity  Return to your normal activities as told by your health care provider. Ask your health care provider what activities are safe for you.  Do not  lift anything that is heavier than 10 lb (4.5 kg), or the limit that you are told, until your health care provider says that it is safe. General instructions  Take over-the-counter and prescription medicines only as told by your health care provider.  Do not take baths, swim, or use a hot tub until your health care provider approves. Ask your health care provider if you may take showers. You may only be allowed to take sponge baths.  Do not drive for 24 hours if you were given a sedative during your procedure.  Wear a medical alert bracelet in case of an emergency. This will tell any health care providers that you have a port.  Keep all follow-up visits as told by your health care provider. This is important. Contact a health care provider if:  You cannot flush your port with saline as directed, or you cannot draw blood from the port.  You have a fever or chills.  You have redness, swelling, or pain around your port insertion site.  You have fluid or blood coming from your port insertion site.  Your port insertion site feels warm to the touch.  You have pus or a bad smell coming from the port insertion site. Get help right away if:  You have chest pain or shortness of breath.  You have bleeding from your port that you cannot control. Summary  Take care of the port as told by your   health care provider. Keep the manufacturer's information card with you at all times.  Change your dressing as told by your health care provider.  Contact a health care provider if you have a fever or chills or if you have redness, swelling, or pain around your port insertion site.  Keep all follow-up visits as told by your health care provider. This information is not intended to replace advice given to you by your health care provider. Make sure you discuss any questions you have with your health care provider. Document Revised: 09/21/2017 Document Reviewed: 09/21/2017 Elsevier Patient Education   2021 [employer]  

## 2020-05-29 NOTE — Addendum Note (Signed)
Addended by: Ihor Gully A on: 05/29/2020 03:44 PM   Modules accepted: Orders

## 2020-05-29 NOTE — Telephone Encounter (Signed)
Scheduled per 03/23 los, patient received updated calender.

## 2020-05-29 NOTE — Patient Instructions (Addendum)
[facility] Discharge Instructions for Patients Receiving Chemotherapy  Today you received the following chemotherapy agents Panitumumab (VECTIBIX), Oxaliplatin (ELOXATIN), Leucovorin & Flourouracil (ADRUCIL).  To help prevent nausea and vomiting after your treatment, we encourage you to take your nausea medication as prescribed.   If you develop nausea and vomiting that is not controlled by your nausea medication, call the clinic.   BELOW ARE SYMPTOMS THAT SHOULD BE REPORTED IMMEDIATELY:  *FEVER GREATER THAN 100.5 F  *CHILLS WITH OR WITHOUT FEVER  NAUSEA AND VOMITING THAT IS NOT CONTROLLED WITH YOUR NAUSEA MEDICATION  *UNUSUAL SHORTNESS OF BREATH  *UNUSUAL BRUISING OR BLEEDING  TENDERNESS IN MOUTH AND THROAT WITH OR WITHOUT PRESENCE OF ULCERS  *URINARY PROBLEMS  *BOWEL PROBLEMS  UNUSUAL RASH Items with * indicate a potential emergency and should be followed up as soon as possible.  Feel free to call the clinic should you have any questions or concerns. The clinic phone number is [phone].  Please show the [facility] at check-in to the Emergency Department and triage nurse.  Magnesium Sulfate injection What is this medicine? MAGNESIUM SULFATE (mag NEE zee um SUL fate) is an electrolyte injection commonly used to treat low magnesium levels in your blood. It is also used to prevent or control seizures in women with preeclampsia or eclampsia. This medicine may be used for other purposes; ask your health care provider or pharmacist if you have questions. What should I tell my health care provider before I take this medicine? They need to know if you have any of these conditions:  heart disease  history of irregular heart beat  kidney disease  an unusual or allergic reaction to magnesium sulfate, medicines, foods, dyes, or preservatives  pregnant or trying to get pregnant  breast-feeding How should I use this medicine? This medicine is for  infusion into a vein. It is given by a health care professional in a hospital or clinic setting. Talk to your pediatrician regarding the use of this medicine in children. While this drug may be prescribed for selected conditions, precautions do apply. Overdosage: If you think you have taken too much of this medicine contact a poison control center or emergency room at once. NOTE: This medicine is only for you. Do not share this medicine with others. What if I miss a dose? This does not apply. What may interact with this medicine? This medicine may interact with the following medications:  certain medicines for anxiety or sleep  certain medicines for seizures like phenobarbital  digoxin  medicines that relax muscles for surgery  narcotic medicines for pain This list may not describe all possible interactions. Give your health care provider a list of all the medicines, herbs, non-prescription drugs, or dietary supplements you use. Also tell them if you smoke, drink alcohol, or use illegal drugs. Some items may interact with your medicine. What should I watch for while using this medicine? Your condition will be monitored carefully while you are receiving this medicine. You may need blood work done while you are receiving this medicine. What side effects may I notice from receiving this medicine? Side effects that you should report to your doctor or health care professional as soon as possible:  allergic reactions like skin rash, itching or hives, swelling of the face, lips, or tongue  facial flushing  muscle weakness  signs and symptoms of low blood pressure like dizziness; feeling faint or lightheaded, falls; unusually weak or tired  signs and symptoms of a  dangerous change in heartbeat or heart rhythm like chest pain; dizziness; fast or irregular heartbeat; palpitations; breathing problems  sweating This list may not describe all possible side effects. Call your doctor for medical  advice about side effects. You may report side effects to FDA at 1-800-FDA-1088. Where should I keep my medicine? This drug is given in a hospital or clinic and will not be stored at home. NOTE: This sheet is a summary. It may not cover all possible information. If you have questions about this medicine, talk to your doctor, pharmacist, or health care provider.  2021 Elsevier/Gold Standard (2015-09-11 12:31:42)

## 2020-05-31 ENCOUNTER — Other Ambulatory Visit: Payer: Self-pay

## 2020-05-31 ENCOUNTER — Inpatient Hospital Stay: Payer: Medicare Other

## 2020-05-31 ENCOUNTER — Telehealth: Payer: Self-pay

## 2020-05-31 VITALS — BP 137/79 | HR 69 | Temp 97.3°F | Resp 18

## 2020-05-31 DIAGNOSIS — Z95828 Presence of other vascular implants and grafts: Secondary | ICD-10-CM

## 2020-05-31 DIAGNOSIS — Z5111 Encounter for antineoplastic chemotherapy: Secondary | ICD-10-CM | POA: Diagnosis not present

## 2020-05-31 DIAGNOSIS — C221 Intrahepatic bile duct carcinoma: Secondary | ICD-10-CM

## 2020-05-31 MED ORDER — HEPARIN SOD (PORK) LOCK FLUSH 100 UNIT/ML IV SOLN
500.0000 [IU] | Freq: Once | INTRAVENOUS | Status: AC | PRN
  Administered 2020-05-31: 500 [IU]
  Filled 2020-05-31: qty 5

## 2020-05-31 MED ORDER — SODIUM CHLORIDE 0.9 % IV SOLN
Freq: Once | INTRAVENOUS | Status: AC
  Filled 2020-05-31: qty 250

## 2020-05-31 MED ORDER — MAGNESIUM SULFATE 4 GM/100ML IV SOLN
4.0000 g | Freq: Once | INTRAVENOUS | Status: AC
  Administered 2020-05-31: 4 g via INTRAVENOUS
  Filled 2020-05-31: qty 100

## 2020-05-31 MED ORDER — SODIUM CHLORIDE 0.9% FLUSH
10.0000 mL | INTRAVENOUS | Status: DC | PRN
  Administered 2020-05-31: 10 mL
  Filled 2020-05-31: qty 10

## 2020-05-31 NOTE — Telephone Encounter (Signed)
Called message left concerning most recent lab ferritin also made aware to stop oral iron tab if still taking encouraged to call CC if she has any questions concerns or changes

## 2020-05-31 NOTE — Telephone Encounter (Signed)
-----   Message from Alla Feeling, NP sent at 05/31/2020 10:29 AM EDT ----- Please let her know ferritin is elevated, she can stop oral iron tab if she is taking it.   Thanks, Regan Rakers, NP

## 2020-05-31 NOTE — Patient Instructions (Signed)
Hypomagnesemia Hypomagnesemia is a condition in which the level of magnesium in the blood is low. Magnesium is a mineral that is found in many foods. It is used in many different processes in the body. Hypomagnesemia can affect every organ in the body. In severe cases, it can cause life-threatening problems. What are the causes? This condition may be caused by:  Not getting enough magnesium in your diet.  Malnutrition.  Problems with absorbing magnesium from the intestines.  Dehydration.  Alcohol abuse.  Vomiting.  Severe or chronic diarrhea.  Some medicines, including medicines that make you urinate more (diuretics).  Certain diseases, such as kidney disease, diabetes, celiac disease, and overactive thyroid. What are the signs or symptoms? Symptoms of this condition include:  Loss of appetite.  Nausea and vomiting.  Involuntary shaking or trembling of a body part (tremor).  Muscle weakness.  Tingling in the arms and legs.  Sudden tightening of muscles (muscle spasms).  Confusion.  Psychiatric issues, such as depression, irritability, or psychosis.  A feeling of fluttering of the heart.  Seizures. These symptoms are more severe if magnesium levels drop suddenly. How is this diagnosed? This condition may be diagnosed based on:  Your symptoms and medical history.  A physical exam.  Blood and urine tests. How is this treated? Treatment depends on the cause and the severity of the condition. It may be treated with:  A magnesium supplement. This can be taken in pill form. If the condition is severe, magnesium is usually given through an IV.  Changes to your diet. You may be directed to eat foods that have a lot of magnesium, such as green leafy vegetables, peas, beans, and nuts.  Stopping any intake of alcohol.   Follow these instructions at home:  Make sure that your diet includes foods with magnesium. Foods that have a lot of magnesium in them  include: ? Green leafy vegetables, such as spinach and broccoli. ? Beans and peas. ? Nuts and seeds, such as almonds and sunflower seeds. ? Whole grains, such as whole grain bread and fortified cereals.  Take magnesium supplements if your health care provider tells you to do that. Take them as directed.  Take over-the-counter and prescription medicines only as told by your health care provider.  Have your magnesium levels monitored as told by your health care provider.  When you are active, drink fluids that contain electrolytes.  Avoid drinking alcohol.  Keep all follow-up visits as told by your health care provider. This is important.      Contact a health care provider if:  You get worse instead of better.  Your symptoms return. Get help right away if you:  Develop severe muscle weakness.  Have trouble breathing.  Feel that your heart is racing. Summary  Hypomagnesemia is a condition in which the level of magnesium in the blood is low.  Hypomagnesemia can affect every organ in the body.  Treatment may include eating more foods that contain magnesium, taking magnesium supplements, and not drinking alcohol.  Have your magnesium levels monitored as told by your health care provider. This information is not intended to replace advice given to you by your health care provider. Make sure you discuss any questions you have with your health care provider. Document Revised: 07/27/2019 Document Reviewed: 07/27/2019 Elsevier Patient Education  2021 [employer]  

## 2020-06-07 NOTE — Progress Notes (Signed)
[place]   [phone]) [phone] Fax:([phone]) [phone]   Clinic Follow up Note   Patient Care Team: [name], MD as PCP - General (Internal Medicine) [name], RN as Oncology Nurse Navigator [name], MD as Consulting Physician (Oncology) [name], MD as Consulting Physician (Gastroenterology)  Date of Service:  06/12/2020  CHIEF COMPLAINT: Follow-up metastatic colon cancer  SUMMARY OF ONCOLOGIC HISTORY: Oncology History Overview Note  Cancer Staging No matching staging information was found for the patient.    metastatic colon cancer to liver  06/20/2019 Procedure   Colonoscopy by Dr [name] 06/20/19  IMPRESSION -Seven 3 to 10 mm polyps in the sigmoid colon, in the transverse colon and in the escending colon, removed with a cold snare. Resected and retrireved.  -One 46m polyp in the descending colon. Biopsies. Tattoes.  -Mediaum sized lipoma in the ascending colon.   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 See Comment B. Colon, Ascending, polyp, Polypectomy:  -TUBULAR ADENOMA -No high grade dysplasia or malignancy.  C. Colon, TRansverse, Polyo, polectomy:  -TUBULAR ADENOMA -No high grade dysplasia or malignancy.  D. Colon, Sigmoid, Polyp, Polypectomy:  -HYPERPLASTIC POLYP   11/07/2019 Imaging   [place]domen 11/07/19  IMPRESSION: 1. Two solid masses in the liver are nonspecific. Recommend MRI abdomen with and without contrast for further evaluation.   12/15/2019 Imaging   MRI Abdomen 12/15/19  IMPRESSION: 1. There are two large masses in the liver with appearance favoring metastatic disease or hepatocellular carcinoma or cholangiocarcinoma. A benign etiology is highly unlikely given the enhancement pattern and associated adenopathy. 2. Considerable porta hepatis and retroperitoneal adenopathy. Some of the confluent porta hepatis tumor is potentially  infiltrative and abuts the pancreatic body along its right upper margin, making it difficult to completely exclude the possibility of pancreatic adenocarcinoma primary. Possibilities helpful in further workup might include tissue diagnosis, endoscopic ultrasound, or nuclear medicine PET-CT. 3. Pancreas divisum. 4. Lumbar spondylosis and degenerative disc disease. 5. Despite efforts by the technologist and patient, motion artifact is present on today's exam and could not be eliminated. This reduces exam sensitivity and specificity.   12/22/2019 Procedure   Upper Endoscopy by Dr [name]10/15/21  IMPRESSION - One lymph node was visualized and measured in the porta hepatis region. Fine needle aspiration performed    12/22/2019 Initial Biopsy   A. LIVER, PORTA HEPATIS MASS, FINE NEEDLE 12/22/19 ASPIRATION:  FINAL MICROSCOPIC DIAGNOSIS:  - Malignant cells consistent with metastatic adenocarcinoma   01/01/2020 Initial Diagnosis   Intrahepatic cholangiocarcinoma ([place]   01/08/2020 Initial Biopsy   FINAL MICROSCOPIC DIAGNOSIS:   A. LIVER, LEFT LOBE, BIOPSY:  - Metastatic adenocarcinoma, consistent with a colorectal primary.  See  comment      COMMENT:   Immunohistochemical stains show the tumor cells are positive for CK20  and CDX2 but negative for CK7, consistent with above interpretation.  Dr. [name] notified on 01/10/2020   01/08/2020 Genetic Testing   [name]  [name] wildtype and [name]/[name] mutations which make her eligible for target biological agent Vectibix.   01/24/2020 Procedure   PAC placed 01/24/20   01/25/2020 -  Chemotherapy   first line FOLFOX starting 01/25/2020, Vextibix  added with C2 (02/06/20)      CURRENT THERAPY:  first line FOLFOX starting 01/25/2020, Vextibixadded with C2 (02/06/20)  INTERVAL HISTORY:  ACORBY VILLASENORis here for a follow up. She was last seen by me on 05/29/20.  She presents to the clinic alone. She notes she is doing well. I  reviewed her medication list with her. She is not taking Nexium as her symptoms have resolved. She is not taking oral iron or Zofran or tramadol. She note her abdominal pain is better and will use Tylenol as well. She notes intermittent neuropathy and skin cracking. She notes fatigue and SOB occasionally. She notes she is still doing all things she wants to do. She notes she is able to walk for 1 mild for 15 minutes. She notes she does go fishing in [place].     REVIEW OF SYSTEMS:   Constitutional: Denies fevers, chills or abnormal weight loss Eyes: Denies blurriness of vision Ears, nose, mouth, throat, and face: Denies mucositis or sore throat Respiratory: Denies cough, dyspnea or wheezes Cardiovascular: Denies palpitation, chest discomfort or lower extremity swelling Gastrointestinal:  Denies nausea, heartburn or change in bowel habits Skin: Denies abnormal skin rashes Lymphatics: Denies new lymphadenopathy or easy bruising Neurological: (+) Neuropathy intermittently  Behavioral/Psych: Mood is stable, no new changes  All other systems were reviewed with the patient and are negative.  MEDICAL HISTORY:  Past Medical History:  Diagnosis Date  . Arthritis   . Diabetes mellitus without complication ([place])   . Hypertension     SURGICAL HISTORY: Past Surgical History:  Procedure Laterality Date  . ABDOMINAL HYSTERECTOMY    . ANTERIOR AND POSTERIOR REPAIR WITH SACROSPINOUS FIXATION N/A 07/16/2016   Procedure: ANTERIOR AND POSTERIOR REPAIR WITH SACROSPINOUS FIXATION;  Surgeon: Everlene Farrier, MD;  Location: [facility];  Service: Gynecology;  Laterality: N/A;  . CYSTOSCOPY  07/16/2016   Procedure: CYSTOSCOPY;  Surgeon: Everlene Farrier, MD;  Location: [facility];  Service: Gynecology;;  . ESOPHAGOGASTRODUODENOSCOPY (EGD) WITH PROPOFOL N/A 12/22/2019   Procedure: ESOPHAGOGASTRODUODENOSCOPY (EGD) WITH PROPOFOL;  Surgeon: [name], MD;  Location: [facility];  Service: Endoscopy;  Laterality: N/A;   . FINE NEEDLE ASPIRATION N/A 12/22/2019   Procedure: FINE NEEDLE ASPIRATION (FNA) LINEAR;  Surgeon: [name], MD;  Location: [facility];  Service: Endoscopy;  Laterality: N/A;  . IR IMAGING GUIDED PORT INSERTION  01/24/2020  . LAPAROSCOPIC VAGINAL HYSTERECTOMY WITH SALPINGECTOMY Bilateral 07/16/2016   Procedure: LAPAROSCOPIC ASSISTED VAGINAL HYSTERECTOMY WITH SALPINGECTOMY;  Surgeon: Everlene Farrier, MD;  Location: [facility];  Service: Gynecology;  Laterality: Bilateral;  . MYOMECTOMY ABDOMINAL APPROACH    . THYROID SURGERY    . tyroid    . UPPER ESOPHAGEAL ENDOSCOPIC ULTRASOUND (EUS) N/A 12/22/2019   Procedure: UPPER ESOPHAGEAL ENDOSCOPIC ULTRASOUND (EUS);  Surgeon: [name], MD;  Location: [facility];  Service: Endoscopy;  Laterality: N/A;    I have reviewed the social history and family history with the patient and they are unchanged from previous note.  ALLERGIES:  has No Known Allergies.  MEDICATIONS:  Current Outpatient Medications  Medication Sig Dispense Refill  . aspirin EC 81 MG tablet Take 1 tablet (81 mg total) by mouth daily. 90 tablet 3  . atorvastatin (LIPITOR) 20 MG tablet Take 20 mg by mouth at bedtime.   1  . clindamycin (CLINDAGEL) 1 % gel Apply topically 2 (two) times daily. 60 g 2  . clobetasol cream (TEMOVATE) 5.63 % Apply 1 application topically daily.    Marland Kitchen doxycycline (VIBRA-TABS) 100 MG tablet Take 1 tablet (100 mg total) by mouth 2 (two) times daily. 60 tablet 1  . lidocaine-prilocaine (EMLA) cream Apply 1 application topically as needed. 30 g 1  . lisinopril-hydrochlorothiazide (ZESTORETIC) 20-12.5 MG tablet Take 1 tablet by  mouth daily with breakfast.     . magic mouthwash w/lidocaine SOLN Take 5 mLs by mouth 4 (four) times daily. 475 mL 2  . magnesium oxide (MAG-OX) 400 (241.3 Mg) MG tablet Take 1 tablet (400 mg total) by mouth in the morning, at noon, and at bedtime. 90 tablet 2  . magnesium oxide (MAG-OX) 400 MG tablet Take 1 tablet by mouth 2  (two) times daily.    . ondansetron (ZOFRAN) 8 MG tablet Take 1 tablet (8 mg total) by mouth every 8 (eight) hours as needed for nausea or vomiting. (Patient not taking: No sig reported) 20 tablet 1  . prochlorperazine (COMPAZINE) 10 MG tablet Take 1 tablet (10 mg total) by mouth every 6 (six) hours as needed for nausea or vomiting. 30 tablet 2  . spironolactone (ALDACTONE) 25 MG tablet Take 25 mg by mouth daily with breakfast.    . traMADol (ULTRAM) 50 MG tablet Take 1 tablet (50 mg total) by mouth every 6 (six) hours as needed. 15 tablet 0   No current facility-administered medications for this visit.   Facility-Administered Medications Ordered in Other Visits  Medication Dose Route Frequency Provider Last Rate Last Admin  . fluorouracil (ADRUCIL) 3,550 mg in sodium chloride 0.9 % 79 mL chemo infusion  1,800 mg/m2 (Treatment Plan Recorded) Intravenous 1 day or 1 dose [name], MD   3,550 mg at 06/12/20 1733  . sodium chloride flush (NS) 0.9 % injection 10 mL  10 mL Intracatheter PRN [name], MD      . sodium chloride flush (NS) 0.9 % injection 10 mL  10 mL Intracatheter PRN [name], MD        PHYSICAL EXAMINATION: ECOG PERFORMANCE STATUS: 1 - Symptomatic but completely ambulatory  Vitals:   06/12/20 1107  BP: (!) 153/85  Pulse: 72  Resp: 17  Temp: (!) 96.8 F (36 C)  SpO2: 98%   Filed Weights   06/12/20 1107  Weight: 184 lb 14.4 oz (83.9 kg)    Due to COVID19 we will limit examination to appearance. Patient had no complaints.  GENERAL:alert, no distress and comfortable SKIN: skin color normal, no rashes or significant lesions EYES: normal, Conjunctiva are pink and non-injected, sclera clear  NEURO: alert & oriented x 3 with fluent speech  LABORATORY DATA:  I have reviewed the data as listed CBC Latest Ref Rng & Units 06/12/2020 05/29/2020 05/15/2020  WBC 4.0 - 10.5 K/uL 3.5(L) 3.5(L) 3.7(L)  Hemoglobin 12.0 - 15.0 g/dL 11.1(L) 11.3(L) 11.8(L)  Hematocrit 36.0 - 46.0 %  32.5(L) 32.9(L) 34.4(L)  Platelets 150 - 400 K/uL 157 140(L) 179     CMP Latest Ref Rng & Units 06/12/2020 05/29/2020 05/15/2020  Glucose 70 - 99 mg/dL 121(H) 113(H) 113(H)  BUN 8 - 23 mg/dL 15 11 13   Creatinine 0.44 - 1.00 mg/dL 0.79 0.66 0.68  Sodium 135 - 145 mmol/L 136 138 138  Potassium 3.5 - 5.1 mmol/L 4.4 3.7 3.8  Chloride 98 - 111 mmol/L 100 100 100  CO2 22 - 32 mmol/L 24 26 27   Calcium 8.9 - 10.3 mg/dL 8.9 9.3 9.7  Total Protein 6.5 - 8.1 g/dL 7.4 7.6 7.8  Total Bilirubin 0.3 - 1.2 mg/dL 0.3 0.3 0.4  Alkaline Phos 38 - 126 U/L 92 92 94  AST 15 - 41 U/L 27 33 30  ALT 0 - 44 U/L 27 31 26       RADIOGRAPHIC STUDIES: I have personally reviewed the radiological images as listed and  agreed with the findings in the report. No results found.   ASSESSMENT & PLAN:  Lindsay [name] is a 65 y.o. female with    1.Colon cancer metastatictoliver, nodes and lung, MSS, [name]/[name] wild type -Her 12/15/19 MRI abdomen showed 2 large liver masses indicating metastatic disease and bulkyporta hepatis and retroperitoneal adenopathy. -Her EUS from 12/22/19 showed 3.5cm LN in porta hepatis regionand biopsy confirmed metastatic adenocarcinoma.Herliver biopsy results confirmed adenocarcinoma, immunostain showed positive CDX2 and CK20, negative CK7, supporting primary colorectalcancer.  -Initial PET from 01/12/20 showedknownliver metastasis, and diffuse adenopathy in thoracic and abdomen. There isa hypermetabolic 1.8 cm pulmonary nodule in left upper lobe, and focal hypermetabolic lesion in the splenic fixture of the colon,concerning for primary tumor. -Sheunderwent repeat colonoscopy and biopsy by Dr. Laruth Bouchard 01/2020. The overall picture is still consistent with colorectal primary. -Although her cancer is not curable at this stage, it is still treatable. I started her on first-line FOLFOX q2weeks on 01/25/20.Vectibix was added with C2. -S/p C9 she continues to tolerate treatment. She has no  lasting neuropathy outside of cold sensitivity. She has been constipated recently. She also has skin cracking and mouth sores. I reviewed management with her.  -Labs reviewed, Hg 11.3, PLt 140K. Overall adequate to proceed with C10 FOLFOX and Vectibix today with 5FU dose reduction.  -Plan to scan her in 3-4 weeks. If she has had good response then will switch to maintenance Xeloda and Vectibix.  -F/u in 2 weeks.    2. Symptom Management: Acne skin rash, Skin toxicity, Constipation  -S/p C4 she has increased skin toxicity from5FU. Dose reduced starting with C5.I encouraged her to keep her skin moisturized and clean.I also suggested cotton gloves. -Her acne skin rash form Vectibix has been isolate to her face andmild-moderate. She can continue clindamycin and Hydrocortisone.  -She also has dry skin leading to cracking. I discussed keeping hands, clean and moisturized. She may use gloves as well.  -For mouth soreness, she continues mouth rinse daily. If not enough I will call in Magic mouth wash -I reviewed constipation management with Senakot-S (given miralax and ducolax has not helped). She can use Milk of Magnesium half bottle at a time for unresolved constipation.    3. Mild NeuropathyG1 -S/p C5 she has intermittent mild neuropathy with numbness in her fingertips only. -Oxaliplatin dose reduced from C6. Will continue to monitor. -She has not had lasting neuropathy lately, beyond cold sensitivity.   4. Chronic Lower abdominal pain, Nausea, secondary to #1 -She has had chronic low abdominal pain for 5 years ranging up to 4 times a week. She notes her pain worsened in the last 3 years, but no work-up until 2021.  -For pain she is now onTramadol for severe pain.She can otherwise she Tylenol -For her intermittent nauseashe hasZofran -I discussed to maintain weight she can continue Ensure with high protein/high calorie diet and remain active.Continue to f/u with dietician.Weight  is stable.  5. HTN, DM, Heart Murmur, Arthritis  -On medications. Managed by her PCP and Cardiologist Dr Einar Gip  6. 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  -She lives with husband and has brothers and sister in and out of town. Her only child passed in recent years.  -She notes her husband is aware of her condition and treatment, but she keeps the information to a minimum as she feels he may not handle it well.  7. Goals of care -she understands the goal is palliative -She is full code.  8. Hypomagnesiasecondary to Vectibix -she has been on oral and iv magnesium replacement    PLAN: -I refilled her compazine today  -Labs reviewed and adequate to proceed with C11FOLFOXand Vectibix today at same dose  -Lab, flush and CT CAP on 06/24/20  -f/u and FOLFOX and Vectibix in 2 weeks  -Lab, flush, f/u and FOLFOX and Vectibix in 4 and 6 weeks  -mag 1.4 today, she will get addition 4g on pump d/c in 2 days, in additional to the iv mag today    No problem-specific Assessment & Plan notes found for this encounter.   No orders of the defined types were placed in this encounter.  All questions were answered. The patient knows to call the clinic with any problems, questions or concerns. No barriers to learning was detected. The total time spent in the appointment was 30 minutes.     [name], MD 06/12/2020   I, Joslyn Devon, am acting as scribe for [name], MD.   I have reviewed the above documentation for accuracy and completeness, and I agree with the above.

## 2020-06-12 ENCOUNTER — Inpatient Hospital Stay: Payer: Medicare (Managed Care) | Attending: Hematology

## 2020-06-12 ENCOUNTER — Telehealth: Payer: Self-pay | Admitting: Hematology

## 2020-06-12 ENCOUNTER — Other Ambulatory Visit: Payer: Self-pay

## 2020-06-12 ENCOUNTER — Inpatient Hospital Stay: Payer: Medicare (Managed Care)

## 2020-06-12 ENCOUNTER — Encounter: Payer: Self-pay | Admitting: Hematology

## 2020-06-12 ENCOUNTER — Inpatient Hospital Stay (HOSPITAL_BASED_OUTPATIENT_CLINIC_OR_DEPARTMENT_OTHER): Payer: Medicare (Managed Care) | Admitting: Hematology

## 2020-06-12 VITALS — BP 153/85 | HR 72 | Temp 96.8°F | Resp 17 | Wt 184.9 lb

## 2020-06-12 DIAGNOSIS — C787 Secondary malignant neoplasm of liver and intrahepatic bile duct: Secondary | ICD-10-CM | POA: Insufficient documentation

## 2020-06-12 DIAGNOSIS — Z5111 Encounter for antineoplastic chemotherapy: Secondary | ICD-10-CM | POA: Diagnosis present

## 2020-06-12 DIAGNOSIS — Z7189 Other specified counseling: Secondary | ICD-10-CM

## 2020-06-12 DIAGNOSIS — C778 Secondary and unspecified malignant neoplasm of lymph nodes of multiple regions: Secondary | ICD-10-CM | POA: Insufficient documentation

## 2020-06-12 DIAGNOSIS — Z95828 Presence of other vascular implants and grafts: Secondary | ICD-10-CM

## 2020-06-12 DIAGNOSIS — R011 Cardiac murmur, unspecified: Secondary | ICD-10-CM | POA: Diagnosis not present

## 2020-06-12 DIAGNOSIS — T451X5A Adverse effect of antineoplastic and immunosuppressive drugs, initial encounter: Secondary | ICD-10-CM | POA: Insufficient documentation

## 2020-06-12 DIAGNOSIS — E119 Type 2 diabetes mellitus without complications: Secondary | ICD-10-CM | POA: Insufficient documentation

## 2020-06-12 DIAGNOSIS — Z7984 Long term (current) use of oral hypoglycemic drugs: Secondary | ICD-10-CM | POA: Insufficient documentation

## 2020-06-12 DIAGNOSIS — Z86010 Personal history of colonic polyps: Secondary | ICD-10-CM | POA: Diagnosis not present

## 2020-06-12 DIAGNOSIS — Z87891 Personal history of nicotine dependence: Secondary | ICD-10-CM | POA: Insufficient documentation

## 2020-06-12 DIAGNOSIS — Z5112 Encounter for antineoplastic immunotherapy: Secondary | ICD-10-CM | POA: Insufficient documentation

## 2020-06-12 DIAGNOSIS — C19 Malignant neoplasm of rectosigmoid junction: Secondary | ICD-10-CM | POA: Insufficient documentation

## 2020-06-12 DIAGNOSIS — I10 Essential (primary) hypertension: Secondary | ICD-10-CM | POA: Insufficient documentation

## 2020-06-12 DIAGNOSIS — C221 Intrahepatic bile duct carcinoma: Secondary | ICD-10-CM

## 2020-06-12 LAB — CBC WITH DIFFERENTIAL (CANCER CENTER ONLY)
Abs Immature Granulocytes: 0.01 10*3/uL (ref 0.00–0.07)
Basophils Absolute: 0 10*3/uL (ref 0.0–0.1)
Basophils Relative: 1 %
Eosinophils Absolute: 0.1 10*3/uL (ref 0.0–0.5)
Eosinophils Relative: 2 %
HCT: 32.5 % — ABNORMAL LOW (ref 36.0–46.0)
Hemoglobin: 11.1 g/dL — ABNORMAL LOW (ref 12.0–15.0)
Immature Granulocytes: 0 %
Lymphocytes Relative: 24 %
Lymphs Abs: 0.8 10*3/uL (ref 0.7–4.0)
MCH: 32 pg (ref 26.0–34.0)
MCHC: 34.2 g/dL (ref 30.0–36.0)
MCV: 93.7 fL (ref 80.0–100.0)
Monocytes Absolute: 0.5 10*3/uL (ref 0.1–1.0)
Monocytes Relative: 14 %
Neutro Abs: 2.1 10*3/uL (ref 1.7–7.7)
Neutrophils Relative %: 59 %
Platelet Count: 157 10*3/uL (ref 150–400)
RBC: 3.47 MIL/uL — ABNORMAL LOW (ref 3.87–5.11)
RDW: 15.5 % (ref 11.5–15.5)
WBC Count: 3.5 10*3/uL — ABNORMAL LOW (ref 4.0–10.5)
nRBC: 0 % (ref 0.0–0.2)

## 2020-06-12 LAB — CMP (CANCER CENTER ONLY)
ALT: 27 U/L (ref 0–44)
AST: 27 U/L (ref 15–41)
Albumin: 3.8 g/dL (ref 3.5–5.0)
Alkaline Phosphatase: 92 U/L (ref 38–126)
Anion gap: 12 (ref 5–15)
BUN: 15 mg/dL (ref 8–23)
CO2: 24 mmol/L (ref 22–32)
Calcium: 8.9 mg/dL (ref 8.9–10.3)
Chloride: 100 mmol/L (ref 98–111)
Creatinine: 0.79 mg/dL (ref 0.44–1.00)
GFR, Estimated: >60 mL/min (ref >60)
Glucose, Bld: 121 mg/dL — ABNORMAL HIGH (ref 70–99)
Potassium: 4.4 mmol/L (ref 3.5–5.1)
Sodium: 136 mmol/L (ref 135–145)
Total Bilirubin: 0.3 mg/dL (ref 0.3–1.2)
Total Protein: 7.4 g/dL (ref 6.5–8.1)

## 2020-06-12 LAB — MAGNESIUM: Magnesium: 1.4 mg/dL — ABNORMAL LOW (ref 1.7–2.4)

## 2020-06-12 LAB — CEA (IN HOUSE-CHCC): CEA (CHCC-In House): 1.17 ng/mL (ref 0.00–5.00)

## 2020-06-12 MED ORDER — SODIUM CHLORIDE 0.9 % IV SOLN
Freq: Once | INTRAVENOUS | Status: AC
  Filled 2020-06-12: qty 250

## 2020-06-12 MED ORDER — PALONOSETRON HCL INJECTION 0.25 MG/5ML
INTRAVENOUS | Status: AC
  Filled 2020-06-12: qty 5

## 2020-06-12 MED ORDER — LEUCOVORIN CALCIUM INJECTION 350 MG
400.0000 mg/m2 | Freq: Once | INTRAVENOUS | Status: AC
  Administered 2020-06-12: 792 mg via INTRAVENOUS
  Filled 2020-06-12: qty 39.6

## 2020-06-12 MED ORDER — SODIUM CHLORIDE 0.9 % IV SOLN
1800.0000 mg/m2 | INTRAVENOUS | Status: DC
  Administered 2020-06-12: 3550 mg via INTRAVENOUS
  Filled 2020-06-12: qty 71

## 2020-06-12 MED ORDER — MAGNESIUM SULFATE 2 GM/50ML IV SOLN
INTRAVENOUS | Status: AC
  Filled 2020-06-12: qty 50

## 2020-06-12 MED ORDER — MAGNESIUM SULFATE 2 GM/50ML IV SOLN
2.0000 g | INTRAVENOUS | Status: AC
  Administered 2020-06-12 (×2): 2 g via INTRAVENOUS

## 2020-06-12 MED ORDER — SODIUM CHLORIDE 0.9% FLUSH
10.0000 mL | INTRAVENOUS | Status: DC | PRN
  Filled 2020-06-12: qty 10

## 2020-06-12 MED ORDER — DEXTROSE 5 % IV SOLN
Freq: Once | INTRAVENOUS | Status: AC
  Filled 2020-06-12: qty 250

## 2020-06-12 MED ORDER — PALONOSETRON HCL INJECTION 0.25 MG/5ML
0.2500 mg | Freq: Once | INTRAVENOUS | Status: AC
  Administered 2020-06-12: 0.25 mg via INTRAVENOUS

## 2020-06-12 MED ORDER — SODIUM CHLORIDE 0.9 % IV SOLN
10.0000 mg | Freq: Once | INTRAVENOUS | Status: AC
  Administered 2020-06-12: 10 mg via INTRAVENOUS
  Filled 2020-06-12: qty 10

## 2020-06-12 MED ORDER — SODIUM CHLORIDE 0.9 % IV SOLN
6.0000 mg/kg | Freq: Once | INTRAVENOUS | Status: AC
  Administered 2020-06-12: 500 mg via INTRAVENOUS
  Filled 2020-06-12: qty 20

## 2020-06-12 MED ORDER — MAGNESIUM SULFATE 4 GM/100ML IV SOLN: 4.0000 g | Freq: Once | INTRAVENOUS | Status: DC

## 2020-06-12 MED ORDER — PROCHLORPERAZINE MALEATE 10 MG PO TABS: 10.0000 mg | ORAL_TABLET | Freq: Four times a day (QID) | ORAL | 2 refills | Status: DC | PRN

## 2020-06-12 MED ORDER — OXALIPLATIN CHEMO INJECTION 100 MG/20ML
60.0000 mg/m2 | Freq: Once | INTRAVENOUS | Status: AC
  Administered 2020-06-12: 120 mg via INTRAVENOUS
  Filled 2020-06-12: qty 20

## 2020-06-12 MED ORDER — SODIUM CHLORIDE 0.9% FLUSH
10.0000 mL | Freq: Once | INTRAVENOUS | Status: AC
  Administered 2020-06-12: 10 mL
  Filled 2020-06-12: qty 10

## 2020-06-12 NOTE — Patient Instructions (Signed)
[facility] Discharge Instructions for Patients Receiving Chemotherapy  Today you received the following chemotherapy agents Magnesium, Panitumumab(Vectibix), Oxalplatin, Leukovorin, Florouricil.  To help prevent nausea and vomiting after your treatment, we encourage you to take your nausea medication as directed.  If you develop nausea and vomiting that is not controlled by your nausea medication, call the clinic.   BELOW ARE SYMPTOMS THAT SHOULD BE REPORTED IMMEDIATELY:  *FEVER GREATER THAN 100.5 F  *CHILLS WITH OR WITHOUT FEVER  NAUSEA AND VOMITING THAT IS NOT CONTROLLED WITH YOUR NAUSEA MEDICATION  *UNUSUAL SHORTNESS OF BREATH  *UNUSUAL BRUISING OR BLEEDING  TENDERNESS IN MOUTH AND THROAT WITH OR WITHOUT PRESENCE OF ULCERS  *URINARY PROBLEMS  *BOWEL PROBLEMS  UNUSUAL RASH Items with * indicate a potential emergency and should be followed up as soon as possible.  Feel free to call the clinic should you have any questions or concerns. The clinic phone number is [phone].  Please show the [facility] at check-in to the Emergency Department and triage nurse.

## 2020-06-12 NOTE — Patient Instructions (Signed)

## 2020-06-12 NOTE — Telephone Encounter (Signed)
Scheduled appts per 4/6 sch msg. Left msg with appts date and times.

## 2020-06-14 ENCOUNTER — Other Ambulatory Visit: Payer: Self-pay

## 2020-06-14 ENCOUNTER — Inpatient Hospital Stay: Payer: Medicare (Managed Care)

## 2020-06-14 ENCOUNTER — Telehealth: Payer: Self-pay | Admitting: Hematology

## 2020-06-14 VITALS — BP 133/72 | HR 71 | Temp 98.9°F | Resp 18

## 2020-06-14 DIAGNOSIS — Z95828 Presence of other vascular implants and grafts: Secondary | ICD-10-CM

## 2020-06-14 DIAGNOSIS — C221 Intrahepatic bile duct carcinoma: Secondary | ICD-10-CM

## 2020-06-14 DIAGNOSIS — Z5112 Encounter for antineoplastic immunotherapy: Secondary | ICD-10-CM | POA: Diagnosis not present

## 2020-06-14 MED ORDER — MAGNESIUM SULFATE 4 GM/100ML IV SOLN
4.0000 g | Freq: Once | INTRAVENOUS | Status: AC
  Administered 2020-06-14: 4 g via INTRAVENOUS
  Filled 2020-06-14: qty 100

## 2020-06-14 MED ORDER — SODIUM CHLORIDE 0.9 % IV SOLN
Freq: Once | INTRAVENOUS | Status: AC
Start: 2020-06-14 — End: 2020-06-14
  Filled 2020-06-14: qty 250

## 2020-06-14 MED ORDER — HEPARIN SOD (PORK) LOCK FLUSH 100 UNIT/ML IV SOLN
500.0000 [IU] | Freq: Once | INTRAVENOUS | Status: DC | PRN
  Filled 2020-06-14: qty 5

## 2020-06-14 MED ORDER — SODIUM CHLORIDE 0.9% FLUSH
10.0000 mL | INTRAVENOUS | Status: DC | PRN
  Filled 2020-06-14: qty 10

## 2020-06-14 NOTE — Telephone Encounter (Signed)
Scheduled per los. Called and left msg. Mailed printout  °

## 2020-06-14 NOTE — Progress Notes (Signed)
Per Dr. Burr Medico ok to bolus remaining 5FU from home infusion

## 2020-06-20 ENCOUNTER — Ambulatory Visit (HOSPITAL_COMMUNITY)
Admission: RE | Admit: 2020-06-20 | Discharge: 2020-06-20 | Disposition: A | Discharge status: Home / Self Care | Payer: Medicare Other | Source: Ambulatory Visit | Attending: Hematology | Admitting: Hematology

## 2020-06-20 ENCOUNTER — Other Ambulatory Visit: Payer: Self-pay

## 2020-06-20 DIAGNOSIS — C221 Intrahepatic bile duct carcinoma: Secondary | ICD-10-CM | POA: Diagnosis present

## 2020-06-20 MED ORDER — IOHEXOL 300 MG/ML  SOLN
100.0000 mL | Freq: Once | INTRAMUSCULAR | Status: AC | PRN
  Administered 2020-06-20: 100 mL via INTRAVENOUS

## 2020-06-21 NOTE — Progress Notes (Signed)
[place]   [phone] [phone]   Clinic Follow up Note   Patient Care Team: [name], MD as PCP - General (Internal Medicine) [name], RN as Oncology Nurse Navigator [name], MD as Consulting Physician (Oncology) [name], MD as Consulting Physician (Gastroenterology)  Date of Service:  06/26/2020  CHIEF COMPLAINT: Follow-up metastatic colon cancer  SUMMARY OF ONCOLOGIC HISTORY: Oncology History Overview Note  Cancer Staging No matching staging information was found for the patient.    metastatic colon cancer to liver  06/20/2019 Procedure   Colonoscopy by Dr [name] 06/20/19  IMPRESSION -Seven 3 to 10 mm polyps in the sigmoid colon, in the transverse colon and in the escending colon, removed with a cold snare. Resected and retrireved.  -One 19m polyp in the descending colon. Biopsies. Tattoes.  -Mediaum sized lipoma in the ascending colon.   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 See Comment B. Colon, Ascending, polyp, Polypectomy:  -TUBULAR ADENOMA -No high grade dysplasia or malignancy.  C. Colon, TRansverse, Polyo, polectomy:  -TUBULAR ADENOMA -No high grade dysplasia or malignancy.  D. Colon, Sigmoid, Polyp, Polypectomy:  -HYPERPLASTIC POLYP   11/07/2019 Imaging   [place]Abdomen 11/07/19  IMPRESSION: 1. Two solid masses in the liver are nonspecific. Recommend MRI abdomen with and without contrast for further evaluation.   12/15/2019 Imaging   MRI Abdomen 12/15/19  IMPRESSION: 1. There are two large masses in the liver with appearance favoring metastatic disease or hepatocellular carcinoma or cholangiocarcinoma. A benign etiology is highly unlikely given the enhancement pattern and associated adenopathy. 2. Considerable porta hepatis and retroperitoneal adenopathy. Some of the confluent porta hepatis tumor is  potentially infiltrative and abuts the pancreatic body along its right upper margin, making it difficult to completely exclude the possibility of pancreatic adenocarcinoma primary. Possibilities helpful in further workup might include tissue diagnosis, endoscopic ultrasound, or nuclear medicine PET-CT. 3. Pancreas divisum. 4. Lumbar spondylosis and degenerative disc disease. 5. Despite efforts by the technologist and patient, motion artifact is present on today's exam and could not be eliminated. This reduces exam sensitivity and specificity.   12/22/2019 Procedure   Upper Endoscopy by Dr [name]10/15/21  IMPRESSION - One lymph node was visualized and measured in the porta hepatis region. Fine needle aspiration performed    12/22/2019 Initial Biopsy   A. LIVER, PORTA HEPATIS MASS, FINE NEEDLE 12/22/19 ASPIRATION:  FINAL MICROSCOPIC DIAGNOSIS:  - Malignant cells consistent with metastatic adenocarcinoma   01/01/2020 Initial Diagnosis   Intrahepatic cholangiocarcinoma ([place]   01/08/2020 Initial Biopsy   FINAL MICROSCOPIC DIAGNOSIS:   A. LIVER, LEFT LOBE, BIOPSY:  - Metastatic adenocarcinoma, consistent with a colorectal primary.  See  comment      COMMENT:   Immunohistochemical stains show the tumor cells are positive for CK20  and CDX2 but negative for CK7, consistent with above interpretation.  Dr. [name] notified on 01/10/2020   01/08/2020 Genetic Testing   [name]  [name] wildtype and [name]/[name] mutations which make her eligible for target biological agent Vectibix.   01/24/2020 Procedure   PAC placed 01/24/20   01/25/2020 -  Chemotherapy   first line FOLFOX starting 01/25/2020, Vextibix  added with C2 (02/06/20)   06/20/2020 Imaging   CT CAP  IMPRESSION: 1. Continued interval reduction in size and conspicuity of a subsolid nodule of the peripheral left upper lobe. 2. Unchanged prominent pretracheal and subcarinal lymph nodes. 3. Redemonstrated  partially  calcified low-attenuation liver masses, slightly decreased in size compared to prior examination. 4. Slight interval decrease in size of a portacaval lymph node or conglomerate and retroperitoneal lymph nodes. 5. Findings are consistent with continued treatment response of nodal, pulmonary, and hepatic metastatic disease. 6. Coronary artery disease.   Aortic Atherosclerosis (ICD10-I70.0).        CURRENT THERAPY:  first line FOLFOX starting 01/25/2020, Vextibixadded with C2 (02/06/20)  INTERVAL HISTORY:  Lindsay [name] is here for a follow up. She was last seen by me on 06/12/20. She presents to the clinic alone. She notes she had sore on her lip 2 days ago. She notes she used magic mouthwash which helped. She wonders can she use Vaseline on it. She notes intermittent tingling of her thumb or feet. She notes this is tolerable. She notes nausea is controlled with antiemetics.     REVIEW OF SYSTEMS:   Constitutional: Denies fevers, chills or abnormal weight loss Eyes: Denies blurriness of vision Ears, nose, mouth, throat, and face: Denies mucositis or sore throat Respiratory: Denies cough, dyspnea or wheezes Cardiovascular: Denies palpitation, chest discomfort or lower extremity swelling Gastrointestinal:  Denies nausea, heartburn or change in bowel habits Skin: Denies abnormal skin rashes Lymphatics: Denies new lymphadenopathy or easy bruising Neurological:Denies numbness, tingling or new weaknesses Behavioral/Psych: Mood is stable, no new changes  All other systems were reviewed with the patient and are negative.  MEDICAL HISTORY:  Past Medical History:  Diagnosis Date  . Arthritis   . Diabetes mellitus without complication ([place])   . Hypertension     SURGICAL HISTORY: Past Surgical History:  Procedure Laterality Date  . ABDOMINAL HYSTERECTOMY    . ANTERIOR AND POSTERIOR REPAIR WITH SACROSPINOUS FIXATION N/A 07/16/2016   Procedure: ANTERIOR AND POSTERIOR REPAIR WITH  SACROSPINOUS FIXATION;  Surgeon: Everlene Farrier, MD;  Location: [facility];  Service: Gynecology;  Laterality: N/A;  . CYSTOSCOPY  07/16/2016   Procedure: CYSTOSCOPY;  Surgeon: Everlene Farrier, MD;  Location: [facility];  Service: Gynecology;;  . ESOPHAGOGASTRODUODENOSCOPY (EGD) WITH PROPOFOL N/A 12/22/2019   Procedure: ESOPHAGOGASTRODUODENOSCOPY (EGD) WITH PROPOFOL;  Surgeon: [name], MD;  Location: [facility];  Service: Endoscopy;  Laterality: N/A;  . FINE NEEDLE ASPIRATION N/A 12/22/2019   Procedure: FINE NEEDLE ASPIRATION (FNA) LINEAR;  Surgeon: [name], MD;  Location: [facility];  Service: Endoscopy;  Laterality: N/A;  . IR IMAGING GUIDED PORT INSERTION  01/24/2020  . LAPAROSCOPIC VAGINAL HYSTERECTOMY WITH SALPINGECTOMY Bilateral 07/16/2016   Procedure: LAPAROSCOPIC ASSISTED VAGINAL HYSTERECTOMY WITH SALPINGECTOMY;  Surgeon: Everlene Farrier, MD;  Location: [facility];  Service: Gynecology;  Laterality: Bilateral;  . MYOMECTOMY ABDOMINAL APPROACH    . THYROID SURGERY    . tyroid    . UPPER ESOPHAGEAL ENDOSCOPIC ULTRASOUND (EUS) N/A 12/22/2019   Procedure: UPPER ESOPHAGEAL ENDOSCOPIC ULTRASOUND (EUS);  Surgeon: [name], MD;  Location: [facility];  Service: Endoscopy;  Laterality: N/A;    I have reviewed the social history and family history with the patient and they are unchanged from previous note.  ALLERGIES:  has No Known Allergies.  MEDICATIONS:  Current Outpatient Medications  Medication Sig Dispense Refill  . valACYclovir (VALTREX) 1000 MG tablet Take 1 tablet (1,000 mg total) by mouth 2 (two) times daily. 10 tablet 0  . aspirin EC 81 MG tablet Take 1 tablet (81 mg total) by mouth daily. 90 tablet 3  . atorvastatin (LIPITOR) 20 MG tablet Take 20 mg by mouth at bedtime.   1  . clindamycin (CLINDAGEL)  1 % gel Apply topically 2 (two) times daily. 60 g 2  . clobetasol cream (TEMOVATE) 5.00 % Apply 1 application topically daily.    Marland Kitchen doxycycline (VIBRA-TABS) 100 MG tablet  Take 1 tablet (100 mg total) by mouth 2 (two) times daily. 60 tablet 1  . lidocaine-prilocaine (EMLA) cream Apply 1 application topically as needed. 30 g 1  . lisinopril-hydrochlorothiazide (ZESTORETIC) 20-12.5 MG tablet Take 1 tablet by mouth daily with breakfast.     . magic mouthwash w/lidocaine SOLN Take 5 mLs by mouth 4 (four) times daily. 475 mL 2  . magnesium oxide (MAG-OX) 400 (241.3 Mg) MG tablet Take 1 tablet (400 mg total) by mouth in the morning, at noon, and at bedtime. 90 tablet 2  . magnesium oxide (MAG-OX) 400 MG tablet Take 1 tablet by mouth 2 (two) times daily.    . ondansetron (ZOFRAN) 8 MG tablet Take 1 tablet (8 mg total) by mouth every 8 (eight) hours as needed for nausea or vomiting. (Patient not taking: No sig reported) 20 tablet 1  . prochlorperazine (COMPAZINE) 10 MG tablet Take 1 tablet (10 mg total) by mouth every 6 (six) hours as needed for nausea or vomiting. 30 tablet 2  . spironolactone (ALDACTONE) 25 MG tablet Take 25 mg by mouth daily with breakfast.    . traMADol (ULTRAM) 50 MG tablet Take 1 tablet (50 mg total) by mouth every 6 (six) hours as needed. 15 tablet 0   No current facility-administered medications for this visit.   Facility-Administered Medications Ordered in Other Visits  Medication Dose Route Frequency Provider Last Rate Last Admin  . dextrose 5 % solution   Intravenous Once [name], MD      . fluorouracil (ADRUCIL) 3,550 mg in sodium chloride 0.9 % 79 mL chemo infusion  1,800 mg/m2 (Treatment Plan Recorded) Intravenous 1 day or 1 dose [name], MD      . heparin lock flush 100 unit/mL  250 Units Intracatheter Once PRN [name], MD      . leucovorin 792 mg in dextrose 5 % 250 mL infusion  400 mg/m2 (Treatment Plan Recorded) Intravenous Once [name], MD      . oxaliplatin (ELOXATIN) 120 mg in dextrose 5 % 500 mL chemo infusion  60 mg/m2 (Treatment Plan Recorded) Intravenous Once [name], MD      . panitumumab (VECTIBIX) 500 mg in sodium  chloride 0.9 % 100 mL chemo infusion  6 mg/kg (Treatment Plan Recorded) Intravenous Once [name], MD      . sodium chloride flush (NS) 0.9 % injection 10 mL  10 mL Intracatheter PRN [name], MD        PHYSICAL EXAMINATION: ECOG PERFORMANCE STATUS: 1 - Symptomatic but completely ambulatory  Vitals:   06/26/20 1155  BP: (!) 141/75  Pulse: 66  Temp: (!) 96.6 F (35.9 C)  SpO2: 100%   Filed Weights   06/26/20 1155  Weight: 180 lb 3.2 oz (81.7 kg)    GENERAL:alert, no distress and comfortable SKIN: skin color, texture, turgor are normal, no rashes or significant lesions (+) Lower lip sore  EYES: normal, Conjunctiva are pink and non-injected, sclera clear OROPHARYNX:no exudate, no erythema and lips, buccal mucosa (+) Dark spots of tongue  NECK: supple, thyroid normal size, non-tender, without nodularity LYMPH:  no palpable lymphadenopathy in the cervical, axillary LUNGS: clear to auscultation and percussion with normal breathing effort HEART: regular rate & rhythm and no murmurs and no lower extremity edema ABDOMEN:abdomen  soft, non-tender and normal bowel sounds Musculoskeletal:no cyanosis of digits and no clubbing  NEURO: alert & oriented x 3 with fluent speech, no focal motor/sensory deficits  LABORATORY DATA:  I have reviewed the data as listed CBC Latest Ref Rng & Units 06/26/2020 06/12/2020 05/29/2020  WBC 4.0 - 10.5 K/uL 4.5 3.5(L) 3.5(L)  Hemoglobin 12.0 - 15.0 g/dL 11.2(L) 11.1(L) 11.3(L)  Hematocrit 36.0 - 46.0 % 32.3(L) 32.5(L) 32.9(L)  Platelets 150 - 400 K/uL 179 157 140(L)     CMP Latest Ref Rng & Units 06/26/2020 06/12/2020 05/29/2020  Glucose 70 - 99 mg/dL 127(H) 121(H) 113(H)  BUN 8 - 23 mg/dL 12 15 11   Creatinine 0.44 - 1.00 mg/dL 0.71 0.79 0.66  Sodium 135 - 145 mmol/L 138 136 138  Potassium 3.5 - 5.1 mmol/L 3.8 4.4 3.7  Chloride 98 - 111 mmol/L 98 100 100  CO2 22 - 32 mmol/L 27 24 26   Calcium 8.9 - 10.3 mg/dL 9.6 8.9 9.3  Total Protein 6.5 - 8.1 g/dL 7.6  7.4 7.6  Total Bilirubin 0.3 - 1.2 mg/dL 0.4 0.3 0.3  Alkaline Phos 38 - 126 U/L 86 92 92  AST 15 - 41 U/L 29 27 33  ALT 0 - 44 U/L 41 27 31      RADIOGRAPHIC STUDIES: I have personally reviewed the radiological images as listed and agreed with the findings in the report. No results found.   ASSESSMENT & PLAN:  MATIE DIMAANO is a 65 y.o. female with    1.Colon cancer metastatictoliver, nodes and lung, MSS, [name]/[name] wild type -Her 12/15/19 MRI abdomen showed 2 large liver masses indicating metastatic disease and bulkyporta hepatis and retroperitoneal adenopathy. -Her EUS from 12/22/19 showed 3.5cm LN in porta hepatis regionand biopsy confirmed metastatic adenocarcinoma.Herliver biopsy results confirmed adenocarcinoma, immunostain showed positive CDX2 and CK20, negative CK7, supporting primary colorectalcancer.  -Initial PET from 01/12/20 showedknownliver metastasis, and diffuse adenopathy in thoracic and abdomen. There isa hypermetabolic 1.8 cm pulmonary nodule in left upper lobe, and focal hypermetabolic lesion in the splenic fixture of the colon,concerning for primary tumor. -Sheunderwent repeat colonoscopy and biopsy by Dr. Laruth Bouchard 01/2020. The overall picture is still consistent with colorectal primary. -Although her cancer is not curable at this stage, it is still treatable. I started her on first-line FOLFOX q2weeks on 01/25/20.Vectibix was added with C2. -I personally reviewed and discussed her CT CAP from 06/20/20 which shows Continued interval reduction in size and conspicuity of left upper lung nodule, stable LNs. She is overall responding well to treatment. Will continue current treatment and consider changing to maintenance Xeloda and Vectibix soon.  -S/p C11 she developed lip sore. She is otherwise tolerating treatment. I discussed management with her.  -Labs reviewed and adequate to proceed with C12 FOLFOX and vectibix.  -F/u in 2 weeks.   2. Symptom  Management: Acne skin rash, Skin toxicity, Constipation  -S/p C4 she has increased skin toxicity from5FU. Dose reduced starting with C5.I encouraged her to keep her skin moisturized and clean.I also suggested cotton gloves. -Her acne skin rash form Vectibix has been isolate to her face andmild-moderate. She can continue clindamycin and Hydrocortisone. -She also has dry skin leading to cracking. I discussed keeping hands, clean and moisturized. She may use gloves as well.  -For mouth soreness, she continues mouth rinse daily. If not enough I will call in Magic mouth wash -I reviewed constipation management with Senakot-S (given miralax and ducolax has not helped). She can use Milk of Magnesium  half bottle at a time for unresolved constipation. -She has small boil on lower left lip, onset 2 days ago. I recommend she continue magic mouthwash. I called in Valtrex in case this is herpes simple virus 1.    3. Mild NeuropathyG1 -S/p C5 she has intermittent mild neuropathy with numbness in her fingertips only. -Oxaliplatin dosereduced fromC6. Will continue to monitor. -She has not had lasting neuropathy lately, beyond cold sensitivity.  4. Chronic Lower abdominal pain, Nausea, secondary to #1 -She has had chronic low abdominal pain for 5 years ranging up to 4 times a week. She notes her pain worsened in the last 3 years, but no work-up until 2021.  -For pain she is now onTramadol for severe pain.She can otherwise she Tylenol -For her intermittent nauseashe hasZofran -I discussed to maintain weight she can continue Ensure with high protein/high calorie diet and remain active.Continue to f/u with dietician.Weight is stable.  5. HTN, DM, Heart Murmur, Arthritis  -On medications. Managed by her PCP and Cardiologist Dr Einar Gip  6. 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  -She lives with husband and has brothers and sister in and out of town. Her  only child passed in recent years.  -She notes her husband is aware of her condition and treatment, but she keeps the information to a minimum as she feels he may not handle it well.  7. Goals of care -she understands the goal is palliative -She is full code.   8. Hypomagnesiasecondary to Vectibix -she has been on oral and iv magnesium replacement, IV replacement as needed.  -will give iv mag 2 g today and 4 g with pump d/c    PLAN: -I called in Valtrex -CT CAP reviewed, good response to treatment.  -Labs reviewed and adequate to proceed with C12FOLFOXand Vectibix todayat same dose  -Lab, flush, f/u and FOLFOX and Vectibix in 2, 4 weeks     No problem-specific Assessment & Plan notes found for this encounter.   No orders of the defined types were placed in this encounter.  All questions were answered. The patient knows to call the clinic with any problems, questions or concerns. No barriers to learning was detected. The total time spent in the appointment was 30 minutes.     [name], MD 06/26/2020   I, Joslyn Devon, am acting as scribe for [name], MD.   I have reviewed the above documentation for accuracy and completeness, and I agree with the above.

## 2020-06-24 ENCOUNTER — Other Ambulatory Visit: Payer: Medicare Other

## 2020-06-26 ENCOUNTER — Inpatient Hospital Stay: Payer: Medicare (Managed Care)

## 2020-06-26 ENCOUNTER — Other Ambulatory Visit: Payer: Self-pay

## 2020-06-26 ENCOUNTER — Other Ambulatory Visit: Payer: Medicare Other

## 2020-06-26 ENCOUNTER — Inpatient Hospital Stay: Payer: Medicare (Managed Care) | Admitting: Hematology

## 2020-06-26 ENCOUNTER — Encounter: Payer: Self-pay | Admitting: Hematology

## 2020-06-26 VITALS — BP 141/75 | HR 66 | Temp 96.6°F | Ht 66.0 in | Wt 180.2 lb

## 2020-06-26 DIAGNOSIS — C221 Intrahepatic bile duct carcinoma: Secondary | ICD-10-CM

## 2020-06-26 DIAGNOSIS — Z7189 Other specified counseling: Secondary | ICD-10-CM

## 2020-06-26 DIAGNOSIS — Z5112 Encounter for antineoplastic immunotherapy: Secondary | ICD-10-CM | POA: Diagnosis not present

## 2020-06-26 DIAGNOSIS — Z95828 Presence of other vascular implants and grafts: Secondary | ICD-10-CM

## 2020-06-26 LAB — CMP (CANCER CENTER ONLY)
ALT: 41 U/L (ref 0–44)
AST: 29 U/L (ref 15–41)
Albumin: 3.9 g/dL (ref 3.5–5.0)
Alkaline Phosphatase: 86 U/L (ref 38–126)
Anion gap: 13 (ref 5–15)
BUN: 12 mg/dL (ref 8–23)
CO2: 27 mmol/L (ref 22–32)
Calcium: 9.6 mg/dL (ref 8.9–10.3)
Chloride: 98 mmol/L (ref 98–111)
Creatinine: 0.71 mg/dL (ref 0.44–1.00)
GFR, Estimated: >60 mL/min (ref >60)
Glucose, Bld: 127 mg/dL — ABNORMAL HIGH (ref 70–99)
Potassium: 3.8 mmol/L (ref 3.5–5.1)
Sodium: 138 mmol/L (ref 135–145)
Total Bilirubin: 0.4 mg/dL (ref 0.3–1.2)
Total Protein: 7.6 g/dL (ref 6.5–8.1)

## 2020-06-26 LAB — CBC WITH DIFFERENTIAL (CANCER CENTER ONLY)
Abs Immature Granulocytes: 0.03 10*3/uL (ref 0.00–0.07)
Basophils Absolute: 0 10*3/uL (ref 0.0–0.1)
Basophils Relative: 0 %
Eosinophils Absolute: 0 10*3/uL (ref 0.0–0.5)
Eosinophils Relative: 1 %
HCT: 32.3 % — ABNORMAL LOW (ref 36.0–46.0)
Hemoglobin: 11.2 g/dL — ABNORMAL LOW (ref 12.0–15.0)
Immature Granulocytes: 1 %
Lymphocytes Relative: 19 %
Lymphs Abs: 0.9 10*3/uL (ref 0.7–4.0)
MCH: 32.7 pg (ref 26.0–34.0)
MCHC: 34.7 g/dL (ref 30.0–36.0)
MCV: 94.2 fL (ref 80.0–100.0)
Monocytes Absolute: 0.6 10*3/uL (ref 0.1–1.0)
Monocytes Relative: 14 %
Neutro Abs: 2.9 10*3/uL (ref 1.7–7.7)
Neutrophils Relative %: 65 %
Platelet Count: 179 10*3/uL (ref 150–400)
RBC: 3.43 MIL/uL — ABNORMAL LOW (ref 3.87–5.11)
RDW: 14.5 % (ref 11.5–15.5)
WBC Count: 4.5 10*3/uL (ref 4.0–10.5)
nRBC: 0 % (ref 0.0–0.2)

## 2020-06-26 LAB — CEA (IN HOUSE-CHCC): CEA (CHCC-In House): <1 ng/mL (ref 0.00–5.00)

## 2020-06-26 LAB — MAGNESIUM: Magnesium: 1.2 mg/dL — ABNORMAL LOW (ref 1.7–2.4)

## 2020-06-26 MED ORDER — MAGNESIUM SULFATE 4 GM/100ML IV SOLN
4.0000 g | Freq: Once | INTRAVENOUS | Status: DC
  Filled 2020-06-26: qty 100

## 2020-06-26 MED ORDER — HEPARIN SOD (PORK) LOCK FLUSH 100 UNIT/ML IV SOLN
250.0000 [IU] | Freq: Once | INTRAVENOUS | Status: DC | PRN
  Filled 2020-06-26: qty 5

## 2020-06-26 MED ORDER — SODIUM CHLORIDE 0.9% FLUSH
10.0000 mL | INTRAVENOUS | Status: DC | PRN
  Filled 2020-06-26: qty 10

## 2020-06-26 MED ORDER — MAGNESIUM SULFATE 2 GM/50ML IV SOLN
2.0000 g | Freq: Once | INTRAVENOUS | Status: AC
Start: 2020-06-26 — End: 2020-06-26
  Administered 2020-06-26: 2 g via INTRAVENOUS

## 2020-06-26 MED ORDER — SODIUM CHLORIDE 0.9 % IV SOLN
6.0000 mg/kg | Freq: Once | INTRAVENOUS | Status: AC
  Administered 2020-06-26: 500 mg via INTRAVENOUS
  Filled 2020-06-26: qty 20

## 2020-06-26 MED ORDER — SODIUM CHLORIDE 0.9% FLUSH
10.0000 mL | Freq: Once | INTRAVENOUS | Status: AC
  Administered 2020-06-26: 10 mL
  Filled 2020-06-26: qty 10

## 2020-06-26 MED ORDER — PALONOSETRON HCL INJECTION 0.25 MG/5ML
INTRAVENOUS | Status: AC
  Filled 2020-06-26: qty 5

## 2020-06-26 MED ORDER — DEXAMETHASONE SODIUM PHOSPHATE 100 MG/10ML IJ SOLN
10.0000 mg | Freq: Once | INTRAMUSCULAR | Status: AC
  Administered 2020-06-26: 10 mg via INTRAVENOUS
  Filled 2020-06-26: qty 10

## 2020-06-26 MED ORDER — MAGNESIUM SULFATE 2 GM/50ML IV SOLN
INTRAVENOUS | Status: AC
  Filled 2020-06-26: qty 50

## 2020-06-26 MED ORDER — SODIUM CHLORIDE 0.9 % IV SOLN
Freq: Once | INTRAVENOUS | Status: AC
Start: 2020-06-26 — End: 2020-06-26
  Filled 2020-06-26: qty 250

## 2020-06-26 MED ORDER — SODIUM CHLORIDE 0.9 % IV SOLN
1800.0000 mg/m2 | INTRAVENOUS | Status: DC
  Administered 2020-06-26: 3550 mg via INTRAVENOUS
  Filled 2020-06-26: qty 71

## 2020-06-26 MED ORDER — DEXTROSE 5 % IV SOLN
Freq: Once | INTRAVENOUS | Status: AC
  Filled 2020-06-26: qty 250

## 2020-06-26 MED ORDER — PALONOSETRON HCL INJECTION 0.25 MG/5ML
0.2500 mg | Freq: Once | INTRAVENOUS | Status: AC
Start: 2020-06-26 — End: 2020-06-26
  Administered 2020-06-26: 0.25 mg via INTRAVENOUS

## 2020-06-26 MED ORDER — VALACYCLOVIR HCL 1 G PO TABS: 1000.0000 mg | ORAL_TABLET | Freq: Two times a day (BID) | ORAL | 0 refills | Status: DC

## 2020-06-26 MED ORDER — OXALIPLATIN CHEMO INJECTION 100 MG/20ML
60.0000 mg/m2 | Freq: Once | INTRAVENOUS | Status: AC
  Administered 2020-06-26: 120 mg via INTRAVENOUS
  Filled 2020-06-26: qty 20

## 2020-06-26 MED ORDER — LEUCOVORIN CALCIUM INJECTION 350 MG
400.0000 mg/m2 | Freq: Once | INTRAVENOUS | Status: AC
  Administered 2020-06-26: 792 mg via INTRAVENOUS
  Filled 2020-06-26: qty 39.6

## 2020-06-26 NOTE — Patient Instructions (Signed)
[facility] Discharge Instructions for Patients Receiving Chemotherapy  Today you received the following chemotherapy agents Magnesium, Panitumumab(Vectibix), Oxalplatin, Leukovorin, Florouricil.  To help prevent nausea and vomiting after your treatment, we encourage you to take your nausea medication as directed.  If you develop nausea and vomiting that is not controlled by your nausea medication, call the clinic.   BELOW ARE SYMPTOMS THAT SHOULD BE REPORTED IMMEDIATELY:  *FEVER GREATER THAN 100.5 F  *CHILLS WITH OR WITHOUT FEVER  NAUSEA AND VOMITING THAT IS NOT CONTROLLED WITH YOUR NAUSEA MEDICATION  *UNUSUAL SHORTNESS OF BREATH  *UNUSUAL BRUISING OR BLEEDING  TENDERNESS IN MOUTH AND THROAT WITH OR WITHOUT PRESENCE OF ULCERS  *URINARY PROBLEMS  *BOWEL PROBLEMS  UNUSUAL RASH Items with * indicate a potential emergency and should be followed up as soon as possible.  Feel free to call the clinic should you have any questions or concerns. The clinic phone number is [phone].  Please show the [facility] at check-in to the Emergency Department and triage nurse.

## 2020-06-27 ENCOUNTER — Telehealth: Payer: Self-pay | Admitting: Hematology

## 2020-06-27 NOTE — Telephone Encounter (Signed)
Left message with follow-up appointments per 4/20 los. Gave option to call back to reschedule if needed.

## 2020-06-27 NOTE — Telephone Encounter (Signed)
Scheduled appts per 4/20 sch msg. Called pt, no answer. Left msg with updated appts information and times.

## 2020-06-28 ENCOUNTER — Inpatient Hospital Stay: Payer: Medicare (Managed Care)

## 2020-06-28 ENCOUNTER — Other Ambulatory Visit: Payer: Self-pay | Admitting: Hematology

## 2020-06-28 ENCOUNTER — Other Ambulatory Visit: Payer: Self-pay

## 2020-06-28 VITALS — BP 122/68 | HR 92 | Temp 98.7°F | Resp 18

## 2020-06-28 DIAGNOSIS — Z5112 Encounter for antineoplastic immunotherapy: Secondary | ICD-10-CM | POA: Diagnosis not present

## 2020-06-28 DIAGNOSIS — C221 Intrahepatic bile duct carcinoma: Secondary | ICD-10-CM

## 2020-06-28 MED ORDER — HEPARIN SOD (PORK) LOCK FLUSH 100 UNIT/ML IV SOLN
500.0000 [IU] | Freq: Once | INTRAVENOUS | Status: AC | PRN
  Administered 2020-06-28: 500 [IU]
  Filled 2020-06-28: qty 5

## 2020-06-28 MED ORDER — SODIUM CHLORIDE 0.9% FLUSH
10.0000 mL | INTRAVENOUS | Status: DC | PRN
  Administered 2020-06-28: 10 mL
  Filled 2020-06-28: qty 10

## 2020-06-28 MED ORDER — MAGNESIUM SULFATE 2 GM/50ML IV SOLN: 2.0000 g | INTRAVENOUS | Status: AC

## 2020-06-28 NOTE — Patient Instructions (Signed)
Implanted Port Insertion, Care After This sheet gives you information about how to care for yourself after your procedure. Your health care provider may also give you more specific instructions. If you have problems or questions, contact your health care provider. What can I expect after the procedure? After the procedure, it is common to have:  Discomfort at the port insertion site.  Bruising on the skin over the port. This should improve over 3-4 days. Follow these instructions at home: Port care  After your port is placed, you will get a manufacturer's information card. The card has information about your port. Keep this card with you at all times.  Take care of the port as told by your health care provider. Ask your health care provider if you or a family member can get training for taking care of the port at home. A home health care nurse may also take care of the port.  Make sure to remember what type of port you have. Incision care  Follow instructions from your health care provider about how to take care of your port insertion site. Make sure you: ? Wash your hands with soap and water before and after you change your bandage (dressing). If soap and water are not available, use hand sanitizer. ? Change your dressing as told by your health care provider. ? Leave stitches (sutures), skin glue, or adhesive strips in place. These skin closures may need to stay in place for 2 weeks or longer. If adhesive strip edges start to loosen and curl up, you may trim the loose edges. Do not remove adhesive strips completely unless your health care provider tells you to do that.  Check your port insertion site every day for signs of infection. Check for: ? Redness, swelling, or pain. ? Fluid or blood. ? Warmth. ? Pus or a bad smell.      Activity  Return to your normal activities as told by your health care provider. Ask your health care provider what activities are safe for you.  Do not  lift anything that is heavier than 10 lb (4.5 kg), or the limit that you are told, until your health care provider says that it is safe. General instructions  Take over-the-counter and prescription medicines only as told by your health care provider.  Do not take baths, swim, or use a hot tub until your health care provider approves. Ask your health care provider if you may take showers. You may only be allowed to take sponge baths.  Do not drive for 24 hours if you were given a sedative during your procedure.  Wear a medical alert bracelet in case of an emergency. This will tell any health care providers that you have a port.  Keep all follow-up visits as told by your health care provider. This is important. Contact a health care provider if:  You cannot flush your port with saline as directed, or you cannot draw blood from the port.  You have a fever or chills.  You have redness, swelling, or pain around your port insertion site.  You have fluid or blood coming from your port insertion site.  Your port insertion site feels warm to the touch.  You have pus or a bad smell coming from the port insertion site. Get help right away if:  You have chest pain or shortness of breath.  You have bleeding from your port that you cannot control. Summary  Take care of the port as told by your   health care provider. Keep the manufacturer's information card with you at all times.  Change your dressing as told by your health care provider.  Contact a health care provider if you have a fever or chills or if you have redness, swelling, or pain around your port insertion site.  Keep all follow-up visits as told by your health care provider. This information is not intended to replace advice given to you by your health care provider. Make sure you discuss any questions you have with your health care provider. Document Revised: 09/21/2017 Document Reviewed: 09/21/2017 Elsevier Patient Education   2021 [employer]  

## 2020-06-29 ENCOUNTER — Inpatient Hospital Stay: Payer: Medicare (Managed Care)

## 2020-06-29 ENCOUNTER — Other Ambulatory Visit: Payer: Self-pay

## 2020-06-29 VITALS — BP 114/78 | HR 63 | Temp 98.1°F | Resp 18

## 2020-06-29 DIAGNOSIS — Z5112 Encounter for antineoplastic immunotherapy: Secondary | ICD-10-CM | POA: Diagnosis not present

## 2020-06-29 MED ORDER — MAGNESIUM SULFATE 4 GM/100ML IV SOLN
4.0000 g | Freq: Once | INTRAVENOUS | Status: AC
  Administered 2020-06-29: 4 g via INTRAVENOUS

## 2020-06-29 MED ORDER — MAGNESIUM SULFATE 2 GM/50ML IV SOLN
INTRAVENOUS | Status: AC
  Filled 2020-06-29: qty 100

## 2020-06-29 MED ORDER — HEPARIN SOD (PORK) LOCK FLUSH 100 UNIT/ML IV SOLN
500.0000 [IU] | Freq: Once | INTRAVENOUS | Status: AC | PRN
  Administered 2020-06-29: 500 [IU]
  Filled 2020-06-29: qty 5

## 2020-06-29 MED ORDER — SODIUM CHLORIDE 0.9 % IV SOLN
INTRAVENOUS | Status: DC
  Filled 2020-06-29 (×2): qty 250

## 2020-06-29 MED ORDER — SODIUM CHLORIDE 0.9% FLUSH
10.0000 mL | Freq: Once | INTRAVENOUS | Status: AC | PRN
  Administered 2020-06-29: 10 mL
  Filled 2020-06-29: qty 10

## 2020-06-29 NOTE — Patient Instructions (Signed)
Magnesium Sulfate injection What is this medicine? MAGNESIUM SULFATE (mag NEE zee um SUL fate) is an electrolyte injection commonly used to treat low magnesium levels in your blood. It is also used to prevent or control seizures in women with preeclampsia or eclampsia. This medicine may be used for other purposes; ask your health care provider or pharmacist if you have questions. What should I tell my health care provider before I take this medicine? They need to know if you have any of these conditions:  heart disease  history of irregular heart beat  kidney disease  an unusual or allergic reaction to magnesium sulfate, medicines, foods, dyes, or preservatives  pregnant or trying to get pregnant  breast-feeding How should I use this medicine? This medicine is for infusion into a vein. It is given by a health care professional in a hospital or clinic setting. Talk to your pediatrician regarding the use of this medicine in children. While this drug may be prescribed for selected conditions, precautions do apply. Overdosage: If you think you have taken too much of this medicine contact a poison control center or emergency room at once. NOTE: This medicine is only for you. Do not share this medicine with others. What if I miss a dose? This does not apply. What may interact with this medicine? This medicine may interact with the following medications:  certain medicines for anxiety or sleep  certain medicines for seizures like phenobarbital  digoxin  medicines that relax muscles for surgery  narcotic medicines for pain This list may not describe all possible interactions. Give your health care provider a list of all the medicines, herbs, non-prescription drugs, or dietary supplements you use. Also tell them if you smoke, drink alcohol, or use illegal drugs. Some items may interact with your medicine. What should I watch for while using this medicine? Your condition will be  monitored carefully while you are receiving this medicine. You may need blood work done while you are receiving this medicine. What side effects may I notice from receiving this medicine? Side effects that you should report to your doctor or health care professional as soon as possible:  allergic reactions like skin rash, itching or hives, swelling of the face, lips, or tongue  facial flushing  muscle weakness  signs and symptoms of low blood pressure like dizziness; feeling faint or lightheaded, falls; unusually weak or tired  signs and symptoms of a dangerous change in heartbeat or heart rhythm like chest pain; dizziness; fast or irregular heartbeat; palpitations; breathing problems  sweating This list may not describe all possible side effects. Call your doctor for medical advice about side effects. You may report side effects to FDA at 1-800-FDA-1088. Where should I keep my medicine? This drug is given in a hospital or clinic and will not be stored at home. NOTE: This sheet is a summary. It may not cover all possible information. If you have questions about this medicine, talk to your doctor, pharmacist, or health care provider.  2021 Elsevier/Gold Standard (2015-09-11 12:31:42)  

## 2020-07-09 ENCOUNTER — Encounter: Payer: Self-pay | Admitting: Hematology

## 2020-07-10 ENCOUNTER — Inpatient Hospital Stay: Payer: Medicare (Managed Care)

## 2020-07-10 ENCOUNTER — Inpatient Hospital Stay: Payer: Medicare (Managed Care) | Attending: Hematology | Admitting: Nurse Practitioner

## 2020-07-10 ENCOUNTER — Other Ambulatory Visit: Payer: Self-pay

## 2020-07-10 ENCOUNTER — Encounter: Payer: Self-pay | Admitting: Nurse Practitioner

## 2020-07-10 ENCOUNTER — Other Ambulatory Visit: Payer: Medicare Other

## 2020-07-10 VITALS — BP 129/75 | HR 67 | Temp 97.0°F | Resp 18 | Ht 66.0 in | Wt 180.7 lb

## 2020-07-10 DIAGNOSIS — C19 Malignant neoplasm of rectosigmoid junction: Secondary | ICD-10-CM | POA: Diagnosis not present

## 2020-07-10 DIAGNOSIS — Z95828 Presence of other vascular implants and grafts: Secondary | ICD-10-CM

## 2020-07-10 DIAGNOSIS — Z7984 Long term (current) use of oral hypoglycemic drugs: Secondary | ICD-10-CM | POA: Diagnosis not present

## 2020-07-10 DIAGNOSIS — Z86010 Personal history of colonic polyps: Secondary | ICD-10-CM | POA: Diagnosis not present

## 2020-07-10 DIAGNOSIS — C189 Malignant neoplasm of colon, unspecified: Secondary | ICD-10-CM | POA: Diagnosis not present

## 2020-07-10 DIAGNOSIS — R011 Cardiac murmur, unspecified: Secondary | ICD-10-CM | POA: Diagnosis not present

## 2020-07-10 DIAGNOSIS — Z5112 Encounter for antineoplastic immunotherapy: Secondary | ICD-10-CM | POA: Diagnosis present

## 2020-07-10 DIAGNOSIS — Z7189 Other specified counseling: Secondary | ICD-10-CM

## 2020-07-10 DIAGNOSIS — I10 Essential (primary) hypertension: Secondary | ICD-10-CM | POA: Diagnosis not present

## 2020-07-10 DIAGNOSIS — E119 Type 2 diabetes mellitus without complications: Secondary | ICD-10-CM | POA: Diagnosis not present

## 2020-07-10 DIAGNOSIS — C787 Secondary malignant neoplasm of liver and intrahepatic bile duct: Secondary | ICD-10-CM | POA: Diagnosis present

## 2020-07-10 DIAGNOSIS — Z5111 Encounter for antineoplastic chemotherapy: Secondary | ICD-10-CM | POA: Insufficient documentation

## 2020-07-10 DIAGNOSIS — Z87891 Personal history of nicotine dependence: Secondary | ICD-10-CM | POA: Insufficient documentation

## 2020-07-10 DIAGNOSIS — C221 Intrahepatic bile duct carcinoma: Secondary | ICD-10-CM

## 2020-07-10 DIAGNOSIS — M1990 Unspecified osteoarthritis, unspecified site: Secondary | ICD-10-CM | POA: Insufficient documentation

## 2020-07-10 DIAGNOSIS — T451X5D Adverse effect of antineoplastic and immunosuppressive drugs, subsequent encounter: Secondary | ICD-10-CM | POA: Insufficient documentation

## 2020-07-10 DIAGNOSIS — C778 Secondary and unspecified malignant neoplasm of lymph nodes of multiple regions: Secondary | ICD-10-CM | POA: Diagnosis not present

## 2020-07-10 LAB — CBC WITH DIFFERENTIAL (CANCER CENTER ONLY)
Abs Immature Granulocytes: 0.01 10*3/uL (ref 0.00–0.07)
Basophils Absolute: 0 10*3/uL (ref 0.0–0.1)
Basophils Relative: 1 %
Eosinophils Absolute: 0.1 10*3/uL (ref 0.0–0.5)
Eosinophils Relative: 2 %
HCT: 31.3 % — ABNORMAL LOW (ref 36.0–46.0)
Hemoglobin: 10.6 g/dL — ABNORMAL LOW (ref 12.0–15.0)
Immature Granulocytes: 0 %
Lymphocytes Relative: 22 %
Lymphs Abs: 0.7 10*3/uL (ref 0.7–4.0)
MCH: 32.6 pg (ref 26.0–34.0)
MCHC: 33.9 g/dL (ref 30.0–36.0)
MCV: 96.3 fL (ref 80.0–100.0)
Monocytes Absolute: 0.6 10*3/uL (ref 0.1–1.0)
Monocytes Relative: 17 %
Neutro Abs: 1.9 10*3/uL (ref 1.7–7.7)
Neutrophils Relative %: 58 %
Platelet Count: 167 10*3/uL (ref 150–400)
RBC: 3.25 MIL/uL — ABNORMAL LOW (ref 3.87–5.11)
RDW: 13.9 % (ref 11.5–15.5)
WBC Count: 3.3 10*3/uL — ABNORMAL LOW (ref 4.0–10.5)
nRBC: 0 % (ref 0.0–0.2)

## 2020-07-10 LAB — CMP (CANCER CENTER ONLY)
ALT: 20 U/L (ref 0–44)
AST: 28 U/L (ref 15–41)
Albumin: 3.7 g/dL (ref 3.5–5.0)
Alkaline Phosphatase: 90 U/L (ref 38–126)
Anion gap: 10 (ref 5–15)
BUN: 10 mg/dL (ref 8–23)
CO2: 30 mmol/L (ref 22–32)
Calcium: 9.4 mg/dL (ref 8.9–10.3)
Chloride: 99 mmol/L (ref 98–111)
Creatinine: 0.67 mg/dL (ref 0.44–1.00)
GFR, Estimated: >60 mL/min (ref >60)
Glucose, Bld: 118 mg/dL — ABNORMAL HIGH (ref 70–99)
Potassium: 3.4 mmol/L — ABNORMAL LOW (ref 3.5–5.1)
Sodium: 139 mmol/L (ref 135–145)
Total Bilirubin: 0.3 mg/dL (ref 0.3–1.2)
Total Protein: 7.5 g/dL (ref 6.5–8.1)

## 2020-07-10 LAB — MAGNESIUM: Magnesium: 1.3 mg/dL — ABNORMAL LOW (ref 1.7–2.4)

## 2020-07-10 MED ORDER — SODIUM CHLORIDE 0.9 % IV SOLN
6.0000 mg/kg | Freq: Once | INTRAVENOUS | Status: AC
  Administered 2020-07-10: 500 mg via INTRAVENOUS
  Filled 2020-07-10: qty 20

## 2020-07-10 MED ORDER — MAGNESIUM SULFATE 2 GM/50ML IV SOLN: 2.0000 g | Freq: Once | INTRAVENOUS | Status: DC

## 2020-07-10 MED ORDER — OXALIPLATIN CHEMO INJECTION 100 MG/20ML
60.0000 mg/m2 | Freq: Once | INTRAVENOUS | Status: AC
  Administered 2020-07-10: 120 mg via INTRAVENOUS
  Filled 2020-07-10: qty 20

## 2020-07-10 MED ORDER — SODIUM CHLORIDE 0.9 % IV SOLN
Freq: Once | INTRAVENOUS | Status: AC
  Filled 2020-07-10: qty 250

## 2020-07-10 MED ORDER — PALONOSETRON HCL INJECTION 0.25 MG/5ML
INTRAVENOUS | Status: AC
  Filled 2020-07-10: qty 5

## 2020-07-10 MED ORDER — SODIUM CHLORIDE 0.9% FLUSH
10.0000 mL | Freq: Once | INTRAVENOUS | Status: AC
  Administered 2020-07-10: 10 mL
  Filled 2020-07-10: qty 10

## 2020-07-10 MED ORDER — DEXTROSE 5 % IV SOLN
Freq: Once | INTRAVENOUS | Status: AC
  Filled 2020-07-10: qty 250

## 2020-07-10 MED ORDER — PALONOSETRON HCL INJECTION 0.25 MG/5ML
0.2500 mg | Freq: Once | INTRAVENOUS | Status: AC
  Administered 2020-07-10: 0.25 mg via INTRAVENOUS

## 2020-07-10 MED ORDER — LEUCOVORIN CALCIUM INJECTION 350 MG
400.0000 mg/m2 | Freq: Once | INTRAVENOUS | Status: AC
  Administered 2020-07-10: 792 mg via INTRAVENOUS
  Filled 2020-07-10: qty 39.6

## 2020-07-10 MED ORDER — MAGNESIUM SULFATE 4 GM/100ML IV SOLN
4.0000 g | Freq: Once | INTRAVENOUS | Status: AC
  Administered 2020-07-10: 4 g via INTRAVENOUS
  Filled 2020-07-10: qty 100

## 2020-07-10 MED ORDER — MAGNESIUM SULFATE 2 GM/50ML IV SOLN
INTRAVENOUS | Status: AC
  Filled 2020-07-10: qty 100

## 2020-07-10 MED ORDER — SODIUM CHLORIDE 0.9 % IV SOLN
1800.0000 mg/m2 | INTRAVENOUS | Status: DC
  Administered 2020-07-10: 3550 mg via INTRAVENOUS
  Filled 2020-07-10: qty 71

## 2020-07-10 MED ORDER — SODIUM CHLORIDE 0.9 % IV SOLN
10.0000 mg | Freq: Once | INTRAVENOUS | Status: AC
  Administered 2020-07-10: 10 mg via INTRAVENOUS
  Filled 2020-07-10: qty 10

## 2020-07-10 NOTE — Patient Instructions (Signed)
[facility]  Discharge Instructions: Thank you for choosing [facility] to provide your oncology and hematology care.   If you have a lab appointment with the [facility], please go directly to the [facility] and check in at the registration area.   Wear comfortable clothing and clothing appropriate for easy access to any Portacath or PICC line.   We strive to give you quality time with your provider. You may need to reschedule your appointment if you arrive late (15 or more minutes).  Arriving late affects you and other patients whose appointments are after yours.  Also, if you miss three or more appointments without notifying the office, you may be dismissed from the clinic at the provider's discretion.      For prescription refill requests, have your pharmacy contact our office and allow 72 hours for refills to be completed.    Today you received the following chemotherapy and/or immunotherapy agents: panitumumab, oxaliplatin, leucovorin, and fluorouracil.       To help prevent nausea and vomiting after your treatment, we encourage you to take your nausea medication as directed.  BELOW ARE SYMPTOMS THAT SHOULD BE REPORTED IMMEDIATELY: . *FEVER GREATER THAN 100.4 F (38 C) OR HIGHER . *CHILLS OR SWEATING . *NAUSEA AND VOMITING THAT IS NOT CONTROLLED WITH YOUR NAUSEA MEDICATION . *UNUSUAL SHORTNESS OF BREATH . *UNUSUAL BRUISING OR BLEEDING . *URINARY PROBLEMS (pain or burning when urinating, or frequent urination) . *BOWEL PROBLEMS (unusual diarrhea, constipation, pain near the anus) . TENDERNESS IN MOUTH AND THROAT WITH OR WITHOUT PRESENCE OF ULCERS (sore throat, sores in mouth, or a toothache) . UNUSUAL RASH, SWELLING OR PAIN  . UNUSUAL VAGINAL DISCHARGE OR ITCHING   Items with * indicate a potential emergency and should be followed up as soon as possible or go to the Emergency Department if any problems should occur.  Please show the  CHEMOTHERAPY ALERT CARD or IMMUNOTHERAPY ALERT CARD at check-in to the Emergency Department and triage nurse.  Should you have questions after your visit or need to cancel or reschedule your appointment, please contact [facility]  Dept: [phone]  and follow the prompts.  Office hours are 8:00 a.m. to 4:30 p.m. Monday - Friday. Please note that voicemails left after 4:00 p.m. may not be returned until the following business day.  We are closed weekends and major holidays. You have access to a nurse at all times for urgent questions. Please call the main number to the clinic Dept: [phone] and follow the prompts.   For any non-urgent questions, you may also contact your provider using MyChart. We now offer e-Visits for anyone 16 and older to request care online for non-urgent symptoms. For details visit [email or web address].   Also download the MyChart app! Go to the app store, search "MyChart", open the app, select [facility], and log in with your MyChart username and password.  Due to Covid, a mask is required upon entering the hospital/clinic. If you do not have a mask, one will be given to you upon arrival. For doctor visits, patients may have 1 support person aged 66 or older with them. For treatment visits, patients cannot have anyone with them due to current Covid guidelines and our immunocompromised population.   Magnesium Sulfate injection What is this medicine? MAGNESIUM SULFATE (mag NEE zee um SUL fate) is an electrolyte injection commonly used to treat low magnesium levels in your blood. It is also used to  prevent or control seizures in women with preeclampsia or eclampsia. This medicine may be used for other purposes; ask your health care provider or pharmacist if you have questions. What should I tell my health care provider before I take this medicine? They need to know if you have any of these conditions:  heart disease  history of  irregular heart beat  kidney disease  an unusual or allergic reaction to magnesium sulfate, medicines, foods, dyes, or preservatives  pregnant or trying to get pregnant  breast-feeding How should I use this medicine? This medicine is for infusion into a vein. It is given by a health care professional in a hospital or clinic setting. Talk to your pediatrician regarding the use of this medicine in children. While this drug may be prescribed for selected conditions, precautions do apply. Overdosage: If you think you have taken too much of this medicine contact a poison control center or emergency room at once. NOTE: This medicine is only for you. Do not share this medicine with others. What if I miss a dose? This does not apply. What may interact with this medicine? This medicine may interact with the following medications:  certain medicines for anxiety or sleep  certain medicines for seizures like phenobarbital  digoxin  medicines that relax muscles for surgery  narcotic medicines for pain This list may not describe all possible interactions. Give your health care provider a list of all the medicines, herbs, non-prescription drugs, or dietary supplements you use. Also tell them if you smoke, drink alcohol, or use illegal drugs. Some items may interact with your medicine. What should I watch for while using this medicine? Your condition will be monitored carefully while you are receiving this medicine. You may need blood work done while you are receiving this medicine. What side effects may I notice from receiving this medicine? Side effects that you should report to your doctor or health care professional as soon as possible:  allergic reactions like skin rash, itching or hives, swelling of the face, lips, or tongue  facial flushing  muscle weakness  signs and symptoms of low blood pressure like dizziness; feeling faint or lightheaded, falls; unusually weak or tired  signs  and symptoms of a dangerous change in heartbeat or heart rhythm like chest pain; dizziness; fast or irregular heartbeat; palpitations; breathing problems  sweating This list may not describe all possible side effects. Call your doctor for medical advice about side effects. You may report side effects to FDA at 1-800-FDA-1088. Where should I keep my medicine? This drug is given in a hospital or clinic and will not be stored at home. NOTE: This sheet is a summary. It may not cover all possible information. If you have questions about this medicine, talk to your doctor, pharmacist, or health care provider.  2021 Elsevier/Gold Standard (2015-09-11 12:31:42)

## 2020-07-10 NOTE — Progress Notes (Addendum)
[facility]   Telephone:([phone] Fax:(336) [phone]   Clinic Follow up Note   Patient Care Team: [name], MD as PCP - General (Internal Medicine) [name], RN as Oncology Nurse Navigator [name], MD as Consulting Physician (Oncology) [name], MD as Consulting Physician (Gastroenterology) 07/10/2020  CHIEF COMPLAINT: Follow up colon cancer   SUMMARY OF ONCOLOGIC HISTORY: Oncology History Overview Note  Cancer Staging No matching staging information was found for the patient.    metastatic colon cancer to liver  06/20/2019 Procedure   Colonoscopy by Dr [name] 06/20/19  IMPRESSION -Seven 3 to 10 mm polyps in the sigmoid colon, in the transverse colon and in the escending colon, removed with a cold snare. Resected and retrireved.  -One 50m polyp in the descending colon. Biopsies. Tattoes.  -Mediaum sized lipoma in the ascending colon.   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 See Comment B. Colon, Ascending, polyp, Polypectomy:  -TUBULAR ADENOMA -No high grade dysplasia or malignancy.  C. Colon, TRansverse, Polyo, polectomy:  -TUBULAR ADENOMA -No high grade dysplasia or malignancy.  D. Colon, Sigmoid, Polyp, Polypectomy:  -HYPERPLASTIC POLYP   11/07/2019 Imaging   UKoreaAbdomen 11/07/19  IMPRESSION: 1. Two solid masses in the liver are nonspecific. Recommend MRI abdomen with and without contrast for further evaluation.   12/15/2019 Imaging   MRI Abdomen 12/15/19  IMPRESSION: 1. There are two large masses in the liver with appearance favoring metastatic disease or hepatocellular carcinoma or cholangiocarcinoma. A benign etiology is highly unlikely given the enhancement pattern and associated adenopathy. 2. Considerable porta hepatis and retroperitoneal adenopathy. Some of the confluent porta hepatis tumor is potentially infiltrative and abuts the  pancreatic body along its right upper margin, making it difficult to completely exclude the possibility of pancreatic adenocarcinoma primary. Possibilities helpful in further workup might include tissue diagnosis, endoscopic ultrasound, or nuclear medicine PET-CT. 3. Pancreas divisum. 4. Lumbar spondylosis and degenerative disc disease. 5. Despite efforts by the technologist and patient, motion artifact is present on today's exam and could not be eliminated. This reduces exam sensitivity and specificity.   12/22/2019 Procedure   Upper Endoscopy by Dr [name]10/15/21  IMPRESSION - One lymph node was visualized and measured in the porta hepatis region. Fine needle aspiration performed    12/22/2019 Initial Biopsy   A. LIVER, PORTA HEPATIS MASS, FINE NEEDLE 12/22/19 ASPIRATION:  FINAL MICROSCOPIC DIAGNOSIS:  - Malignant cells consistent with metastatic adenocarcinoma   01/01/2020 Initial Diagnosis   Intrahepatic cholangiocarcinoma ([facility]   01/08/2020 Initial Biopsy   FINAL MICROSCOPIC DIAGNOSIS:   A. LIVER, LEFT LOBE, BIOPSY:  - Metastatic adenocarcinoma, consistent with a colorectal primary.  See  comment      COMMENT:   Immunohistochemical stains show the tumor cells are positive for CK20  and CDX2 but negative for CK7, consistent with above interpretation.  Dr. [name] notified on 01/10/2020   01/08/2020 Genetic Testing   [name]  [name] wildtype and [name]/[name] mutations which make her eligible for target biological agent Vectibix.   01/24/2020 Procedure   PAC placed 01/24/20   01/25/2020 -  Chemotherapy   first line FOLFOX starting 01/25/2020, Vextibix  added with C2 (02/06/20)   06/20/2020 Imaging   CT CAP  IMPRESSION: 1. Continued interval reduction in size and conspicuity of a subsolid nodule of the peripheral left upper lobe. 2. Unchanged prominent pretracheal and subcarinal lymph nodes. 3. Redemonstrated partially calcified low-attenuation liver  masses, slightly decreased in size compared to prior examination. 4. Slight interval decrease in size of a portacaval lymph node or conglomerate and retroperitoneal lymph nodes. 5. Findings are consistent with continued treatment response of nodal, pulmonary, and hepatic metastatic disease. 6. Coronary artery disease.   Aortic Atherosclerosis (ICD10-I70.0).       CURRENT THERAPY: First line FOLFOX starting 01/25/20, vectibix added with Cycle 2  INTERVAL HISTORY: Lindsay [name] returns for follow up as scheduled. She completed another cycle of FOLFOX/vectibix on 06/26/20.  She reports things are going okay.  Appetite fluctuates, the sore on her lip resolved.  Taste is decreased but she continues eating and drinking well.  Energy level is adequate, she continues to do all her normal activities.  She has very mild and intermittent right upper quadrant pain, not new or worse.  She had 1 episode of vomiting and diarrhea after mixing several different kinds of foods.  She continues to have mild intermittent neuropathy in the left greater than right fingertips, she functions well without difficulty.  Cold sensitivity lasts 1 week.  This is overall stable on chemo.  She had chills 2 weeks ago then fell asleep, woke up feeling normal.  Also mentioned she has occasional blood-tinged sputum that has been going on for a while, she is on aspirin for heart prevention.  No fever, chest pain, dyspnea.  All other systems were reviewed with the patient and are negative.  MEDICAL HISTORY:  Past Medical History:  Diagnosis Date  . Arthritis   . Diabetes mellitus without complication ([place])   . Hypertension     SURGICAL HISTORY: Past Surgical History:  Procedure Laterality Date  . ABDOMINAL HYSTERECTOMY    . ANTERIOR AND POSTERIOR REPAIR WITH SACROSPINOUS FIXATION N/A 07/16/2016   Procedure: ANTERIOR AND POSTERIOR REPAIR WITH SACROSPINOUS FIXATION;  Surgeon: Everlene Farrier, MD;  Location: [facility];  Service:  Gynecology;  Laterality: N/A;  . CYSTOSCOPY  07/16/2016   Procedure: CYSTOSCOPY;  Surgeon: Everlene Farrier, MD;  Location: [facility];  Service: Gynecology;;  . ESOPHAGOGASTRODUODENOSCOPY (EGD) WITH PROPOFOL N/A 12/22/2019   Procedure: ESOPHAGOGASTRODUODENOSCOPY (EGD) WITH PROPOFOL;  Surgeon: [name], MD;  Location: [facility];  Service: Endoscopy;  Laterality: N/A;  . FINE NEEDLE ASPIRATION N/A 12/22/2019   Procedure: FINE NEEDLE ASPIRATION (FNA) LINEAR;  Surgeon: [name], MD;  Location: [facility];  Service: Endoscopy;  Laterality: N/A;  . IR IMAGING GUIDED PORT INSERTION  01/24/2020  . LAPAROSCOPIC VAGINAL HYSTERECTOMY WITH SALPINGECTOMY Bilateral 07/16/2016   Procedure: LAPAROSCOPIC ASSISTED VAGINAL HYSTERECTOMY WITH SALPINGECTOMY;  Surgeon: Everlene Farrier, MD;  Location: [facility];  Service: Gynecology;  Laterality: Bilateral;  . MYOMECTOMY ABDOMINAL APPROACH    . THYROID SURGERY    . tyroid    . UPPER ESOPHAGEAL ENDOSCOPIC ULTRASOUND (EUS) N/A 12/22/2019   Procedure: UPPER ESOPHAGEAL ENDOSCOPIC ULTRASOUND (EUS);  Surgeon: [name], MD;  Location: [facility];  Service: Endoscopy;  Laterality: N/A;    I have reviewed the social history and family history with the patient and they are unchanged from previous note.  ALLERGIES:  has No Known Allergies.  MEDICATIONS:  Current Outpatient Medications  Medication Sig Dispense Refill  . aspirin EC 81 MG tablet Take 1 tablet (81 mg total) by mouth daily. 90 tablet 3  . atorvastatin (LIPITOR) 20 MG tablet Take 20 mg by mouth at bedtime.   1  . clindamycin (CLINDAGEL) 1 % gel Apply topically 2 (two) times daily. 60 g 2  . clobetasol cream (TEMOVATE) 0.05 % Apply  1 application topically daily.    Marland Kitchen doxycycline (VIBRA-TABS) 100 MG tablet Take 1 tablet (100 mg total) by mouth 2 (two) times daily. 60 tablet 1  . lidocaine-prilocaine (EMLA) cream Apply 1 application topically as needed. 30 g 1  . lisinopril-hydrochlorothiazide  (ZESTORETIC) 20-12.5 MG tablet Take 1 tablet by mouth daily with breakfast.     . magic mouthwash w/lidocaine SOLN Take 5 mLs by mouth 4 (four) times daily. 475 mL 2  . magnesium oxide (MAG-OX) 400 (241.3 Mg) MG tablet Take 1 tablet (400 mg total) by mouth in the morning, at noon, and at bedtime. 90 tablet 2  . magnesium oxide (MAG-OX) 400 MG tablet Take 1 tablet by mouth 2 (two) times daily.    . ondansetron (ZOFRAN) 8 MG tablet Take 1 tablet (8 mg total) by mouth every 8 (eight) hours as needed for nausea or vomiting. (Patient not taking: No sig reported) 20 tablet 1  . prochlorperazine (COMPAZINE) 10 MG tablet Take 1 tablet (10 mg total) by mouth every 6 (six) hours as needed for nausea or vomiting. 30 tablet 2  . spironolactone (ALDACTONE) 25 MG tablet Take 25 mg by mouth daily with breakfast.    . traMADol (ULTRAM) 50 MG tablet Take 1 tablet (50 mg total) by mouth every 6 (six) hours as needed. 15 tablet 0  . valACYclovir (VALTREX) 1000 MG tablet Take 1 tablet (1,000 mg total) by mouth 2 (two) times daily. 10 tablet 0   Current Facility-Administered Medications  Medication Dose Route Frequency Provider Last Rate Last Admin  . magnesium sulfate IVPB 2 g 50 mL  2 g Intravenous Once Alla Feeling, NP       Facility-Administered Medications Ordered in Other Visits  Medication Dose Route Frequency Provider Last Rate Last Admin  . dexamethasone (DECADRON) 10 mg in sodium chloride 0.9 % 50 mL IVPB  10 mg Intravenous Once [name], MD      . dextrose 5 % solution   Intravenous Once [name], MD      . fluorouracil (ADRUCIL) 3,550 mg in sodium chloride 0.9 % 79 mL chemo infusion  1,800 mg/m2 (Treatment Plan Recorded) Intravenous 1 day or 1 dose [name], MD      . leucovorin 792 mg in dextrose 5 % 250 mL infusion  400 mg/m2 (Treatment Plan Recorded) Intravenous Once [name], MD      . magnesium sulfate IVPB 4 g 100 mL  4 g Intravenous Once [name], MD      . oxaliplatin (ELOXATIN) 120 mg in  dextrose 5 % 500 mL chemo infusion  60 mg/m2 (Treatment Plan Recorded) Intravenous Once [name], MD      . palonosetron (ALOXI) injection 0.25 mg  0.25 mg Intravenous Once [name], MD      . panitumumab (VECTIBIX) 500 mg in sodium chloride 0.9 % 100 mL chemo infusion  6 mg/kg (Treatment Plan Recorded) Intravenous Once [name], MD        PHYSICAL EXAMINATION: ECOG PERFORMANCE STATUS: 1 - Symptomatic but completely ambulatory  Vitals:   07/10/20 0919  BP: 129/75  Pulse: 67  Resp: 18  Temp: (!) 97 F (36.1 C)  SpO2: 100%   Filed Weights   07/10/20 0919  Weight: 180 lb 11.2 oz (82 kg)    GENERAL:alert, no distress and comfortable SKIN: Palms with mild hyperpigmentation and few small cracks at the fingertips.  Nailbeds are dark EYES: sclera clear OROPHARYNX: No thrush or ulcers.  Discolored  tongue LUNGS: clear with normal breathing effort HEART: regular rate & rhythm, murmur, and no lower extremity edema ABDOMEN:abdomen soft, non-tender and normal bowel sounds NEURO: alert & oriented x 3 with fluent speech, no focal motor deficits PAC without erythema  LABORATORY DATA:  I have reviewed the data as listed CBC Latest Ref Rng & Units 07/10/2020 06/26/2020 06/12/2020  WBC 4.0 - 10.5 K/uL 3.3(L) 4.5 3.5(L)  Hemoglobin 12.0 - 15.0 g/dL 10.6(L) 11.2(L) 11.1(L)  Hematocrit 36.0 - 46.0 % 31.3(L) 32.3(L) 32.5(L)  Platelets 150 - 400 K/uL 167 179 157     CMP Latest Ref Rng & Units 07/10/2020 06/26/2020 06/12/2020  Glucose 70 - 99 mg/dL 118(H) 127(H) 121(H)  BUN 8 - 23 mg/dL 10 12 15   Creatinine 0.44 - 1.00 mg/dL 0.67 0.71 0.79  Sodium 135 - 145 mmol/L 139 138 136  Potassium 3.5 - 5.1 mmol/L 3.4(L) 3.8 4.4  Chloride 98 - 111 mmol/L 99 98 100  CO2 22 - 32 mmol/L 30 27 24   Calcium 8.9 - 10.3 mg/dL 9.4 9.6 8.9  Total Protein 6.5 - 8.1 g/dL 7.5 7.6 7.4  Total Bilirubin 0.3 - 1.2 mg/dL 0.3 0.4 0.3  Alkaline Phos 38 - 126 U/L 90 86 92  AST 15 - 41 U/L 28 29 27   ALT 0 - 44 U/L 20 41 27       RADIOGRAPHIC STUDIES: I have personally reviewed the radiological images as listed and agreed with the findings in the report. No results found.   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 MSS, [name]/[name] wildtype -Her 12/15/19 MRI abdomen showed 2 large liver masses indicating metastatic disease and bulkyporta hepatis and retroperitoneal adenopathy. -Her EUS from 12/22/19 showed 3.5cm LN in porta hepatis regionand biopsy confirmed metastatic adenocarcinoma.Preliminary cytology sample is not adequate for any additional IHC study for the origin of tumor, the morphology does not support HCC. -Her EGD was negative formalignancy. Her colonoscopy in March 2021 didshow a large 2 cm polyps in descending colon, biopsy showed high-grade dysplasia. -Liver biopsyconfirmed adenocarcinoma, immunostain showed positive CDX2 and CK20, negative CK7, supporting primary colorectalcancer  -PET scan showedknownliver metastasis, and diffuse thoracic and abdominaladenopathy.There isa hypermetabolic 1.8 cm pulmonary nodule in left upper lobe, and focal hypermetabolic lesion in the splenic fixture of the colon,concerning for primary tumor. -Sheunderwent repeat colonoscopy and biopsy by Dr. Benson Norway; additional IHC testing with TTF-1 shows focal staining, which can be seen in 5-10% of colorectal primary cancers as well as lung cancer. However given the above, the overall picture is still consistent with colorectal primary. -She began first line systemic FOLFOX on 11/18, goal is palliative. Toleratingwell -FOshows [name]/[name] wild-type, she is a candidate for EGFR inhibitor Panitumumabwhich was added with C2 -Restaging CT's 04/01/2020 and 06/20/20 showed interval reduction in size of liver, lung, and nodal metastases.CEA normalized as of 04/16/2020  2. Chronic Lower abdominal pain, Nausea  -She has had chronic low abdominal pain  for 5 years ranging up to 4 times a week. She notes her pain worsened in the last 3 years, but no work-up until 2021.  -Her pain is 5/10 at most but when high not managed on Motrin. -she has tramadol but does not take it much -encouraged her to use Ensure/boost if she has decreased appetite -f/up with dietician -abdominal pain improved on chemo.  mild an intermittent. Does not require medication   3. 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 Remains functional without difficulty. Oxali has been dose-reduced and stable since then  4. 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  -She lives with husband and has brothers and sister in and out of town. Her only child passed in recent years.   5. Goals of care -we discussed the incurable but treatable nature of her stage IV cancer and general prognosis. She understands the prognosis is poor if she does not tolerate or respond well to chemo -she understands the goal is palliative  -full code   6. Skin and nail changes -secondary to chemo (likely 5FU) -keep hands moisturized, reviewed symptom management   7. Blood tinged sputum  -Reported on 07/10/20, has been ongoing but intermittent -on ASA for prevention, no other AC -no clinical signs of sinusitis. No respiratory symptoms -hold aspirin and monitor   Disposition:  Lindsay [name] appears stable. She continues FOLFOX and panitumumab, tolerating well overall. She is able to recover and function well. We reviewed symptom management.   Labs reviewed, CBC and CMP are stable, Mg 1.3. Continue oral Mg TID. She will receive 4 g Mg IVPB today and with pump d/c.   Proceed with cycle 13 FOLFOX and panitumumab today as planned. F/up in 2 weeks with cycle 14.   All questions were answered. The patient  knows to call the clinic with any problems, questions or concerns. No barriers to learning were detected.     Alla Feeling, NP 07/10/20

## 2020-07-11 ENCOUNTER — Telehealth: Payer: Self-pay

## 2020-07-11 NOTE — Telephone Encounter (Signed)
Spoke with patient about updated appointment time. Verbalized understanding of 5/6 appt at 12pm for magnesium followed by pump stop.

## 2020-07-11 NOTE — Telephone Encounter (Signed)
Left message on voicemail notifying patient of upcoming infusion appt on 07/12/2020 @ 11am.

## 2020-07-12 ENCOUNTER — Inpatient Hospital Stay: Payer: Medicare (Managed Care)

## 2020-07-12 ENCOUNTER — Other Ambulatory Visit: Payer: Self-pay

## 2020-07-12 VITALS — BP 135/76 | HR 82 | Temp 97.1°F | Resp 20 | Ht 66.0 in | Wt 183.3 lb

## 2020-07-12 DIAGNOSIS — C221 Intrahepatic bile duct carcinoma: Secondary | ICD-10-CM

## 2020-07-12 DIAGNOSIS — Z5112 Encounter for antineoplastic immunotherapy: Secondary | ICD-10-CM | POA: Diagnosis not present

## 2020-07-12 MED ORDER — SODIUM CHLORIDE 0.9% FLUSH
10.0000 mL | INTRAVENOUS | Status: DC | PRN
  Administered 2020-07-12: 10 mL
  Filled 2020-07-12: qty 10

## 2020-07-12 MED ORDER — SODIUM CHLORIDE 0.9 % IV SOLN
INTRAVENOUS | Status: DC
  Filled 2020-07-12: qty 250

## 2020-07-12 MED ORDER — MAGNESIUM SULFATE 4 GM/100ML IV SOLN
4.0000 g | Freq: Once | INTRAVENOUS | Status: AC
Start: 2020-07-12 — End: 2020-07-12
  Administered 2020-07-12: 4 g via INTRAVENOUS
  Filled 2020-07-12: qty 100

## 2020-07-12 MED ORDER — HEPARIN SOD (PORK) LOCK FLUSH 100 UNIT/ML IV SOLN
500.0000 [IU] | Freq: Once | INTRAVENOUS | Status: AC | PRN
  Administered 2020-07-12: 500 [IU]
  Filled 2020-07-12: qty 5

## 2020-07-12 NOTE — Patient Instructions (Signed)
Hypomagnesemia Hypomagnesemia is a condition in which the level of magnesium in the blood is low. Magnesium is a mineral that is found in many foods. It is used in many different processes in the body. Hypomagnesemia can affect every organ in the body. In severe cases, it can cause life-threatening problems. What are the causes? This condition may be caused by:  Not getting enough magnesium in your diet.  Malnutrition.  Problems with absorbing magnesium from the intestines.  Dehydration.  Alcohol abuse.  Vomiting.  Severe or chronic diarrhea.  Some medicines, including medicines that make you urinate more (diuretics).  Certain diseases, such as kidney disease, diabetes, celiac disease, and overactive thyroid. What are the signs or symptoms? Symptoms of this condition include:  Loss of appetite.  Nausea and vomiting.  Involuntary shaking or trembling of a body part (tremor).  Muscle weakness.  Tingling in the arms and legs.  Sudden tightening of muscles (muscle spasms).  Confusion.  Psychiatric issues, such as depression, irritability, or psychosis.  A feeling of fluttering of the heart.  Seizures. These symptoms are more severe if magnesium levels drop suddenly. How is this diagnosed? This condition may be diagnosed based on:  Your symptoms and medical history.  A physical exam.  Blood and urine tests. How is this treated? Treatment depends on the cause and the severity of the condition. It may be treated with:  A magnesium supplement. This can be taken in pill form. If the condition is severe, magnesium is usually given through an IV.  Changes to your diet. You may be directed to eat foods that have a lot of magnesium, such as green leafy vegetables, peas, beans, and nuts.  Stopping any intake of alcohol.   Follow these instructions at home:  Make sure that your diet includes foods with magnesium. Foods that have a lot of magnesium in them  include: ? Green leafy vegetables, such as spinach and broccoli. ? Beans and peas. ? Nuts and seeds, such as almonds and sunflower seeds. ? Whole grains, such as whole grain bread and fortified cereals.  Take magnesium supplements if your health care provider tells you to do that. Take them as directed.  Take over-the-counter and prescription medicines only as told by your health care provider.  Have your magnesium levels monitored as told by your health care provider.  When you are active, drink fluids that contain electrolytes.  Avoid drinking alcohol.  Keep all follow-up visits as told by your health care provider. This is important.      Contact a health care provider if:  You get worse instead of better.  Your symptoms return. Get help right away if you:  Develop severe muscle weakness.  Have trouble breathing.  Feel that your heart is racing. Summary  Hypomagnesemia is a condition in which the level of magnesium in the blood is low.  Hypomagnesemia can affect every organ in the body.  Treatment may include eating more foods that contain magnesium, taking magnesium supplements, and not drinking alcohol.  Have your magnesium levels monitored as told by your health care provider. This information is not intended to replace advice given to you by your health care provider. Make sure you discuss any questions you have with your health care provider. Document Revised: 07/27/2019 Document Reviewed: 07/27/2019 Elsevier Patient Education  2021 [employer]  

## 2020-07-16 ENCOUNTER — Telehealth: Payer: Self-pay | Admitting: Hematology

## 2020-07-16 NOTE — Telephone Encounter (Signed)
Left message with follow-up appointments per 5/4 los. Gave option to call back to reschedule if needed.

## 2020-07-19 NOTE — Progress Notes (Signed)
Lindsay [name]   [phone] [phone]   Clinic Follow up Note   Patient Care Team: [name], MD as PCP - General (Internal Medicine) [name], RN as Oncology Nurse Navigator [name], MD as Consulting Physician (Oncology) [name], MD as Consulting Physician (Gastroenterology)  Date of Service:  07/24/2020  CHIEF COMPLAINT: Follow-up metastatic colon cancer  SUMMARY OF ONCOLOGIC HISTORY: Oncology History Overview Note  Cancer Staging No matching staging information was found for the patient.    metastatic colon cancer to liver  06/20/2019 Procedure   Colonoscopy by Dr [name] 06/20/19  IMPRESSION -Seven 3 to 10 mm polyps in the sigmoid colon, in the transverse colon and in the escending colon, removed with a cold snare. Resected and retrireved.  -One 72m polyp in the descending colon. Biopsies. Tattoes.  -Mediaum sized lipoma in the ascending colon.   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 See Comment B. Colon, Ascending, polyp, Polypectomy:  -TUBULAR ADENOMA -No high grade dysplasia or malignancy.  C. Colon, TRansverse, Polyo, polectomy:  -TUBULAR ADENOMA -No high grade dysplasia or malignancy.  D. Colon, Sigmoid, Polyp, Polypectomy:  -HYPERPLASTIC POLYP   11/07/2019 Imaging   UKoreaAbdomen 11/07/19  IMPRESSION: 1. Two solid masses in the liver are nonspecific. Recommend MRI abdomen with and without contrast for further evaluation.   12/15/2019 Imaging   MRI Abdomen 12/15/19  IMPRESSION: 1. There are two large masses in the liver with appearance favoring metastatic disease or hepatocellular carcinoma or cholangiocarcinoma. A benign etiology is highly unlikely given the enhancement pattern and associated adenopathy. 2. Considerable porta hepatis and retroperitoneal adenopathy. Some of the confluent porta hepatis tumor is  potentially infiltrative and abuts the pancreatic body along its right upper margin, making it difficult to completely exclude the possibility of pancreatic adenocarcinoma primary. Possibilities helpful in further workup might include tissue diagnosis, endoscopic ultrasound, or nuclear medicine PET-CT. 3. Pancreas divisum. 4. Lumbar spondylosis and degenerative disc disease. 5. Despite efforts by the technologist and patient, motion artifact is present on today's exam and could not be eliminated. This reduces exam sensitivity and specificity.   12/22/2019 Procedure   Upper Endoscopy by Dr [name]10/15/21  IMPRESSION - One lymph node was visualized and measured in the porta hepatis region. Fine needle aspiration performed    12/22/2019 Initial Biopsy   A. LIVER, PORTA HEPATIS MASS, FINE NEEDLE 12/22/19 ASPIRATION:  FINAL MICROSCOPIC DIAGNOSIS:  - Malignant cells consistent with metastatic adenocarcinoma   01/01/2020 Initial Diagnosis   Intra[place]atic [place]angiocarcinoma [place]   01/08/2020 Initial Biopsy   FINAL MICROSCOPIC DIAGNOSIS:   A. LIVER, LEFT LOBE, BIOPSY:  - Metastatic adenocarcinoma, consistent with a colorectal primary.  See  comment      COMMENT:   Immunohistochemical stains show the tumor cells are positive for CK20  and CDX2 but negative for CK7, consistent with above interpretation.  Dr. [name] notified on 01/10/2020   01/08/2020 Genetic Testing   [name]  [name] wildtype and [name]/[name] mutations which make her eligible for target biological agent Vectibix.   01/24/2020 Procedure   PAC placed 01/24/20   01/25/2020 -  Chemotherapy   first line FOLFOX starting 01/25/2020, Vextibix  added with C2 (02/06/20)   06/20/2020 Imaging   CT CAP  IMPRESSION: 1. Continued interval reduction in size and conspicuity of a subsolid nodule of the peripheral left upper lobe. 2. Unchanged prominent pretracheal and subcarinal lymph nodes. 3. Redemonstrated  partially  calcified low-attenuation liver masses, slightly decreased in size compared to prior examination. 4. Slight interval decrease in size of a portacaval lymph node or conglomerate and retroperitoneal lymph nodes. 5. Findings are consistent with continued treatment response of nodal, pulmonary, and hepatic metastatic disease. 6. Coronary artery disease.   Aortic Atherosclerosis (ICD10-I70.0).        CURRENT THERAPY:  First line FOLFOX starting 01/25/2020, Vextibixadded with C2 (02/06/20)  INTERVAL HISTORY:  Lindsay [name] is here for a follow up. She was last seen by me 06/26/20. She presents to the clinic alone. She notes more numbness in her fingers from chemo. She denies dropping objects or trouble with balance. Her function is overall still adequate. She notes taste change and eating less. She her mild weight loss. She notes her energy is mostly stable and she is able to remain active.    REVIEW OF SYSTEMS:   Constitutional: Denies fevers, chills or abnormal weight loss Eyes: Denies blurriness of vision Ears, nose, mouth, throat, and face: Denies mucositis or sore throat Respiratory: Denies cough, dyspnea or wheezes Cardiovascular: Denies palpitation, chest discomfort or lower extremity swelling Gastrointestinal:  Denies nausea, heartburn or change in bowel habits Skin: Denies abnormal skin rashes Lymphatics: Denies new lymphadenopathy or easy bruising Neurological: (+) numbness in fingers Behavioral/Psych: Mood is stable, no new changes  All other systems were reviewed with the patient and are negative.  MEDICAL HISTORY:  Past Medical History:  Diagnosis Date  . Arthritis   . Diabetes mellitus without complication ([place])   . Hypertension     SURGICAL HISTORY: Past Surgical History:  Procedure Laterality Date  . ABDOMINAL HYSTERECTOMY    . ANTERIOR AND POSTERIOR REPAIR WITH SACROSPINOUS FIXATION N/A 07/16/2016   Procedure: ANTERIOR AND POSTERIOR REPAIR WITH SACROSPINOUS  FIXATION;  Surgeon: Everlene Farrier, MD;  Location: [facility];  Service: Gynecology;  Laterality: N/A;  . CYSTOSCOPY  07/16/2016   Procedure: CYSTOSCOPY;  Surgeon: Everlene Farrier, MD;  Location: [facility];  Service: Gynecology;;  . ESOPHAGOGASTRODUODENOSCOPY (EGD) WITH PROPOFOL N/A 12/22/2019   Procedure: ESOPHAGOGASTRODUODENOSCOPY (EGD) WITH PROPOFOL;  Surgeon: [name], MD;  Location: [facility];  Service: Endoscopy;  Laterality: N/A;  . FINE NEEDLE ASPIRATION N/A 12/22/2019   Procedure: FINE NEEDLE ASPIRATION (FNA) LINEAR;  Surgeon: [name], MD;  Location: [facility];  Service: Endoscopy;  Laterality: N/A;  . IR IMAGING GUIDED PORT INSERTION  01/24/2020  . LAPAROSCOPIC VAGINAL HYSTERECTOMY WITH SALPINGECTOMY Bilateral 07/16/2016   Procedure: LAPAROSCOPIC ASSISTED VAGINAL HYSTERECTOMY WITH SALPINGECTOMY;  Surgeon: Everlene Farrier, MD;  Location: [facility];  Service: Gynecology;  Laterality: Bilateral;  . MYOMECTOMY ABDOMINAL APPROACH    . THYROID SURGERY    . tyroid    . UPPER ESOPHAGEAL ENDOSCOPIC ULTRASOUND (EUS) N/A 12/22/2019   Procedure: UPPER ESOPHAGEAL ENDOSCOPIC ULTRASOUND (EUS);  Surgeon: [name], MD;  Location: [facility];  Service: Endoscopy;  Laterality: N/A;    I have reviewed the social history and family history with the patient and they are unchanged from previous note.  ALLERGIES:  has No Known Allergies.  MEDICATIONS:  Current Outpatient Medications  Medication Sig Dispense Refill  . aspirin EC 81 MG tablet Take 1 tablet (81 mg total) by mouth daily. 90 tablet 3  . atorvastatin (LIPITOR) 20 MG tablet Take 20 mg by mouth at bedtime.   1  . clindamycin (CLINDAGEL) 1 % gel Apply topically 2 (two) times daily. 60 g 2  . clobetasol cream (TEMOVATE) 4.74 % Apply 1 application topically daily.    Marland Kitchen  doxycycline (VIBRA-TABS) 100 MG tablet Take 1 tablet (100 mg total) by mouth 2 (two) times daily. 60 tablet 1  . esomeprazole (NEXIUM) 40 MG capsule Take 1 capsule (40  mg total) by mouth daily before breakfast. 30 capsule 3  . lidocaine-prilocaine (EMLA) cream Apply 1 application topically as needed. 30 g 1  . lisinopril-hydrochlorothiazide (ZESTORETIC) 20-12.5 MG tablet Take 1 tablet by mouth daily with breakfast.     . magic mouthwash w/lidocaine SOLN Take 5 mLs by mouth 4 (four) times daily. 475 mL 2  . magnesium oxide (MAG-OX) 400 (241.3 Mg) MG tablet Take 1 tablet (400 mg total) by mouth in the morning, at noon, and at bedtime. 90 tablet 2  . magnesium oxide (MAG-OX) 400 MG tablet Take 1 tablet by mouth 2 (two) times daily.    . ondansetron (ZOFRAN) 8 MG tablet Take 1 tablet (8 mg total) by mouth every 8 (eight) hours as needed for nausea or vomiting. (Patient not taking: No sig reported) 20 tablet 1  . prochlorperazine (COMPAZINE) 10 MG tablet Take 1 tablet (10 mg total) by mouth every 6 (six) hours as needed for nausea or vomiting. 30 tablet 2  . spironolactone (ALDACTONE) 25 MG tablet Take 25 mg by mouth daily with breakfast.    . traMADol (ULTRAM) 50 MG tablet Take 1 tablet (50 mg total) by mouth every 6 (six) hours as needed. 15 tablet 0  . valACYclovir (VALTREX) 1000 MG tablet Take 1 tablet (1,000 mg total) by mouth 2 (two) times daily. 10 tablet 0   No current facility-administered medications for this visit.   Facility-Administered Medications Ordered in Other Visits  Medication Dose Route Frequency Provider Last Rate Last Admin  . fluorouracil (ADRUCIL) 3,550 mg in sodium chloride 0.9 % 79 mL chemo infusion  1,800 mg/m2 (Treatment Plan Recorded) Intravenous 1 day or 1 dose [name], MD      . leucovorin 792 mg in dextrose 5 % 250 mL infusion  400 mg/m2 (Treatment Plan Recorded) Intravenous Once [name], MD 145 mL/hr at 07/24/20 1503 792 mg at 07/24/20 1503  . oxaliplatin (ELOXATIN) 80 mg in dextrose 5 % 500 mL chemo infusion  40 mg/m2 (Treatment Plan Recorded) Intravenous Once [name], MD 258 mL/hr at 07/24/20 1504 80 mg at 07/24/20 1504     PHYSICAL EXAMINATION: ECOG PERFORMANCE STATUS: 1 - Symptomatic but completely ambulatory  Vitals with BMI 07/24/2020  Height   Weight 178 lbs  BMI 27.78  Systolic 242  Diastolic 76  Pulse 72    Due to COVID19 we will limit examination to appearance. Patient had no complaints.  GENERAL:alert, no distress and comfortable SKIN: skin color normal, no rashes or significant lesions EYES: normal, Conjunctiva are pink and non-injected, sclera clear  NEURO: alert & oriented x 3 with fluent speech   LABORATORY DATA:  I have reviewed the data as listed CBC Latest Ref Rng & Units 07/24/2020 07/10/2020 06/26/2020  WBC 4.0 - 10.5 K/uL 4.1 3.3(L) 4.5  Hemoglobin 12.0 - 15.0 g/dL 10.7(L) 10.6(L) 11.2(L)  Hematocrit 36.0 - 46.0 % 31.0(L) 31.3(L) 32.3(L)  Platelets 150 - 400 K/uL 141(L) 167 179     CMP Latest Ref Rng & Units 07/24/2020 07/10/2020 06/26/2020  Glucose 70 - 99 mg/dL 111(H) 118(H) 127(H)  BUN 8 - 23 mg/dL 10 10 12   Creatinine 0.44 - 1.00 mg/dL 0.64 0.67 0.71  Sodium 135 - 145 mmol/L 140 139 138  Potassium 3.5 - 5.1 mmol/L 3.2(L) 3.4(L) 3.8  Chloride  98 - 111 mmol/L 100 99 98  CO2 22 - 32 mmol/L 30 30 27   Calcium 8.9 - 10.3 mg/dL 9.1 9.4 9.6  Total Protein 6.5 - 8.1 g/dL 7.5 7.5 7.6  Total Bilirubin 0.3 - 1.2 mg/dL 0.4 0.3 0.4  Alkaline Phos 38 - 126 U/L 83 90 86  AST 15 - 41 U/L 29 28 29   ALT 0 - 44 U/L 17 20 41      RADIOGRAPHIC STUDIES: I have personally reviewed the radiological images as listed and agreed with the findings in the report. No results found.   ASSESSMENT & PLAN:  NOELA BROTHERS is a 65 y.o. female with    1.Colon cancer metastatictoliver, nodes and lung, MSS, [name]/[name] wild type -Her 12/15/19 MRI abdomen showed 2 large liver masses indicating metastatic disease and bulkyporta hepatis and retroperitoneal adenopathy. -Her EUS from 12/22/19 showed 3.5cm LN in porta hepatis regionand biopsy confirmed metastatic adenocarcinoma.Herliver biopsy  results confirmed adenocarcinoma, immunostain showed positive CDX2 and CK20, negative CK7, supporting primary colorectalcancer.  -Initial PET from 01/12/20 showedknownliver metastasis, and diffuse adenopathy in thoracic and abdomen. There isa hypermetabolic 1.8 cm pulmonary nodule in left upper lobe, and focal hypermetabolic lesion in the splenic fixture of the colon,concerning for primary tumor. -Sheunderwent repeat colonoscopy and biopsy by Dr. Laruth Bouchard 01/2020. The overall picture is still consistent with colorectal primary. -Although her cancer is not curable at this stage, it is still treatable. I started her on first-line FOLFOX q2weeks on 01/25/20.Vectibix was added with C2. -Given good response on 06/20/20 CT CAP and concern for neuropathy, will change chemo to maintenance Xeloda and Vectibix soon.  -She has more numbness in fingers and lower appetite on recent chemo. She has adequate function but has lost some weight. Labs reviewed, plt 141K. Overall adequate to proceed with C14 FOLFOX and Vectibix today with oxaliplatin dose reduction. We discussed that I will stop her Oxaliplaitn in near future.  -F/u in 2 weeks.   2. Symptom Management: Acne skin rash, Skin toxicity, Constipation, Acid Reflux  -S/p C4 she has increased skin toxicity from5FU. Dose reduced starting with C5.I encouraged her to keep her skin moisturized and clean.I also suggested cotton gloves. -Her acne skin rash form Vectibix has been isolate to her face andmild-moderate. She can continue clindamycin and Hydrocortisone. -She also has dry skin leading to cracking. I discussed keeping hands, clean and moisturized. She may use gloves as well.  -For mouth soreness, she continues mouth rinse daily. If not enough I will call in Magic mouth wash -For constipation, she can manage with Senakot-S (given miralax and ducolax has not helped). She can use Milk of Magnesium half bottle at a time for unresolved  constipation. -She has more acid reflux lately which has impacted her sleep. This is likely from chemo and steroids. She will continue Nexium, I refilled today (07/24/20).    3. Mild NeuropathyG1 -S/p C5 she has intermittent mild neuropathy with numbness in her fingertips only. -Oxaliplatin dosereduced fromC6. Will continue to monitor. -S/p C12-13 she has more numbness in her fingers but no change in function. Will monitor closely and will likely stop Oxaliplatin soon.   4. Chronic Lower abdominal pain, Nausea, secondary to #1 -She has had chronic low abdominal pain for 5 years ranging up to 4 times a week. She notes her pain worsened in the last 3 years, but no work-up until 2021.  -For pain she is now onTramadol for severe pain.She can otherwise she Tylenol -For her intermittent nauseashe  hasZofran -I discussed to maintain weight she can continue Ensure with high protein/high calorie diet and remain active.Continue to f/u with dietician.Weight is stable.  5. HTN, DM, Heart Murmur, Arthritis  -On medications. Managed by her PCP and Cardiologist Dr Einar Gip  6. 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  -She lives with husband and has brothers and sister in and out of town. Her only child passed in recent years.  -She notes her husband is aware of her condition and treatment, but she keeps the information to a minimum as she feels he may not handle it well.  7. Goals of care -she understands the goal is palliative -She is full code.   8. Hypomagnesiasecondary to Vectibix -She has been on oral TID and iv magnesium replacement, IV replacement as needed.   PLAN: -I refilled Nexium today and called in Lidocaine cream  -Labs reviewed and adequate to proceed with C13FOLFOXand Vectibix todaywith oxaliplatin dose reduction to 3m/m2.  -Lab, flush, f/u and FOLFOX and Vectibix in2, 4 weeks    No problem-specific Assessment & Plan  notes found for this encounter.   No orders of the defined types were placed in this encounter.  All questions were answered. The patient knows to call the clinic with any problems, questions or concerns. No barriers to learning was detected. The total time spent in the appointment was 30 minutes.     YTruitt Merle MD 07/24/2020   I, AJoslyn Devon am acting as scribe for YTruitt Merle MD.   I have reviewed the above documentation for accuracy and completeness, and I agree with the above.

## 2020-07-24 ENCOUNTER — Inpatient Hospital Stay: Payer: Medicare (Managed Care)

## 2020-07-24 ENCOUNTER — Encounter: Payer: Self-pay | Admitting: Hematology

## 2020-07-24 ENCOUNTER — Other Ambulatory Visit: Payer: Medicare Other

## 2020-07-24 ENCOUNTER — Other Ambulatory Visit: Payer: Self-pay

## 2020-07-24 ENCOUNTER — Inpatient Hospital Stay: Payer: Medicare (Managed Care) | Admitting: Hematology

## 2020-07-24 VITALS — BP 158/76 | HR 72 | Temp 98.3°F | Resp 17 | Wt 178.0 lb

## 2020-07-24 DIAGNOSIS — Z5112 Encounter for antineoplastic immunotherapy: Secondary | ICD-10-CM | POA: Diagnosis not present

## 2020-07-24 DIAGNOSIS — Z95828 Presence of other vascular implants and grafts: Secondary | ICD-10-CM

## 2020-07-24 DIAGNOSIS — C221 Intrahepatic bile duct carcinoma: Secondary | ICD-10-CM | POA: Diagnosis not present

## 2020-07-24 DIAGNOSIS — Z7189 Other specified counseling: Secondary | ICD-10-CM

## 2020-07-24 LAB — CBC WITH DIFFERENTIAL (CANCER CENTER ONLY)
Abs Immature Granulocytes: 0.01 10*3/uL (ref 0.00–0.07)
Basophils Absolute: 0 10*3/uL (ref 0.0–0.1)
Basophils Relative: 0 %
Eosinophils Absolute: 0 10*3/uL (ref 0.0–0.5)
Eosinophils Relative: 1 %
HCT: 31 % — ABNORMAL LOW (ref 36.0–46.0)
Hemoglobin: 10.7 g/dL — ABNORMAL LOW (ref 12.0–15.0)
Immature Granulocytes: 0 %
Lymphocytes Relative: 17 %
Lymphs Abs: 0.7 10*3/uL (ref 0.7–4.0)
MCH: 32.5 pg (ref 26.0–34.0)
MCHC: 34.5 g/dL (ref 30.0–36.0)
MCV: 94.2 fL (ref 80.0–100.0)
Monocytes Absolute: 0.5 10*3/uL (ref 0.1–1.0)
Monocytes Relative: 12 %
Neutro Abs: 2.9 10*3/uL (ref 1.7–7.7)
Neutrophils Relative %: 70 %
Platelet Count: 141 10*3/uL — ABNORMAL LOW (ref 150–400)
RBC: 3.29 MIL/uL — ABNORMAL LOW (ref 3.87–5.11)
RDW: 13.4 % (ref 11.5–15.5)
WBC Count: 4.1 10*3/uL (ref 4.0–10.5)
nRBC: 0 % (ref 0.0–0.2)

## 2020-07-24 LAB — CMP (CANCER CENTER ONLY)
ALT: 17 U/L (ref 0–44)
AST: 29 U/L (ref 15–41)
Albumin: 3.7 g/dL (ref 3.5–5.0)
Alkaline Phosphatase: 83 U/L (ref 38–126)
Anion gap: 10 (ref 5–15)
BUN: 10 mg/dL (ref 8–23)
CO2: 30 mmol/L (ref 22–32)
Calcium: 9.1 mg/dL (ref 8.9–10.3)
Chloride: 100 mmol/L (ref 98–111)
Creatinine: 0.64 mg/dL (ref 0.44–1.00)
GFR, Estimated: >60 mL/min (ref >60)
Glucose, Bld: 111 mg/dL — ABNORMAL HIGH (ref 70–99)
Potassium: 3.2 mmol/L — ABNORMAL LOW (ref 3.5–5.1)
Sodium: 140 mmol/L (ref 135–145)
Total Bilirubin: 0.4 mg/dL (ref 0.3–1.2)
Total Protein: 7.5 g/dL (ref 6.5–8.1)

## 2020-07-24 LAB — MAGNESIUM: Magnesium: 1.1 mg/dL — ABNORMAL LOW (ref 1.7–2.4)

## 2020-07-24 LAB — CEA (IN HOUSE-CHCC): CEA (CHCC-In House): 1 ng/mL (ref 0.00–5.00)

## 2020-07-24 MED ORDER — PALONOSETRON HCL INJECTION 0.25 MG/5ML
0.2500 mg | Freq: Once | INTRAVENOUS | Status: AC
Start: 2020-07-24 — End: 2020-07-24
  Administered 2020-07-24: 0.25 mg via INTRAVENOUS

## 2020-07-24 MED ORDER — LIDOCAINE-PRILOCAINE 2.5-2.5 % EX CREA: 1.0000 "application " | TOPICAL_CREAM | CUTANEOUS | 1 refills | Status: DC | PRN

## 2020-07-24 MED ORDER — OXALIPLATIN CHEMO INJECTION 100 MG/20ML
40.0000 mg/m2 | Freq: Once | INTRAVENOUS | Status: AC
  Administered 2020-07-24: 80 mg via INTRAVENOUS
  Filled 2020-07-24: qty 16

## 2020-07-24 MED ORDER — SODIUM CHLORIDE 0.9 % IV SOLN
Freq: Once | INTRAVENOUS | Status: AC
Start: 2020-07-24 — End: 2020-07-24
  Filled 2020-07-24: qty 250

## 2020-07-24 MED ORDER — SODIUM CHLORIDE 0.9 % IV SOLN
6.0000 mg/kg | Freq: Once | INTRAVENOUS | Status: AC
  Administered 2020-07-24: 500 mg via INTRAVENOUS
  Filled 2020-07-24: qty 20

## 2020-07-24 MED ORDER — SODIUM CHLORIDE 0.9 % IV SOLN
Freq: Once | INTRAVENOUS | Status: AC
  Filled 2020-07-24: qty 250

## 2020-07-24 MED ORDER — SODIUM CHLORIDE 0.9 % IV SOLN
1800.0000 mg/m2 | INTRAVENOUS | Status: DC
  Administered 2020-07-24: 3550 mg via INTRAVENOUS
  Filled 2020-07-24: qty 71

## 2020-07-24 MED ORDER — ESOMEPRAZOLE MAGNESIUM 40 MG PO CPDR: 40.0000 mg | DELAYED_RELEASE_CAPSULE | Freq: Every day | ORAL | 3 refills | Status: DC

## 2020-07-24 MED ORDER — MAGNESIUM SULFATE 4 GM/100ML IV SOLN
4.0000 g | Freq: Once | INTRAVENOUS | Status: AC
  Administered 2020-07-24: 4 g via INTRAVENOUS
  Filled 2020-07-24: qty 100

## 2020-07-24 MED ORDER — DEXTROSE 5 % IV SOLN
Freq: Once | INTRAVENOUS | Status: AC
Start: 2020-07-24 — End: 2020-07-24
  Filled 2020-07-24: qty 250

## 2020-07-24 MED ORDER — SODIUM CHLORIDE 0.9 % IV SOLN
10.0000 mg | Freq: Once | INTRAVENOUS | Status: AC
  Administered 2020-07-24: 10 mg via INTRAVENOUS
  Filled 2020-07-24: qty 10

## 2020-07-24 MED ORDER — SODIUM CHLORIDE 0.9% FLUSH
10.0000 mL | Freq: Once | INTRAVENOUS | Status: AC
  Administered 2020-07-24: 10 mL
  Filled 2020-07-24: qty 10

## 2020-07-24 MED ORDER — PALONOSETRON HCL INJECTION 0.25 MG/5ML
INTRAVENOUS | Status: AC
  Filled 2020-07-24: qty 5

## 2020-07-24 MED ORDER — LEUCOVORIN CALCIUM INJECTION 350 MG
400.0000 mg/m2 | Freq: Once | INTRAVENOUS | Status: AC
  Administered 2020-07-24: 792 mg via INTRAVENOUS
  Filled 2020-07-24: qty 39.6

## 2020-07-24 NOTE — Patient Instructions (Signed)
[facility]  Discharge Instructions: Thank you for choosing [facility] to provide your oncology and hematology care.   If you have a lab appointment with the [facility], please go directly to the [facility] and check in at the registration area.   Wear comfortable clothing and clothing appropriate for easy access to any Portacath or PICC line.   We strive to give you quality time with your provider. You may need to reschedule your appointment if you arrive late (15 or more minutes).  Arriving late affects you and other patients whose appointments are after yours.  Also, if you miss three or more appointments without notifying the office, you may be dismissed from the clinic at the provider's discretion.      For prescription refill requests, have your pharmacy contact our office and allow 72 hours for refills to be completed.    Today you received the following chemotherapy and/or immunotherapy agents: panitumumab, oxaliplatin, leucovorin, and fluorouracil.       To help prevent nausea and vomiting after your treatment, we encourage you to take your nausea medication as directed.  BELOW ARE SYMPTOMS THAT SHOULD BE REPORTED IMMEDIATELY: . *FEVER GREATER THAN 100.4 F (38 C) OR HIGHER . *CHILLS OR SWEATING . *NAUSEA AND VOMITING THAT IS NOT CONTROLLED WITH YOUR NAUSEA MEDICATION . *UNUSUAL SHORTNESS OF BREATH . *UNUSUAL BRUISING OR BLEEDING . *URINARY PROBLEMS (pain or burning when urinating, or frequent urination) . *BOWEL PROBLEMS (unusual diarrhea, constipation, pain near the anus) . TENDERNESS IN MOUTH AND THROAT WITH OR WITHOUT PRESENCE OF ULCERS (sore throat, sores in mouth, or a toothache) . UNUSUAL RASH, SWELLING OR PAIN  . UNUSUAL VAGINAL DISCHARGE OR ITCHING   Items with * indicate a potential emergency and should be followed up as soon as possible or go to the Emergency Department if any problems should occur.  Please show the  CHEMOTHERAPY ALERT CARD or IMMUNOTHERAPY ALERT CARD at check-in to the Emergency Department and triage nurse.  Should you have questions after your visit or need to cancel or reschedule your appointment, please contact [facility]  Dept: [phone]  and follow the prompts.  Office hours are 8:00 a.m. to 4:30 p.m. Monday - Friday. Please note that voicemails left after 4:00 p.m. may not be returned until the following business day.  We are closed weekends and major holidays. You have access to a nurse at all times for urgent questions. Please call the main number to the clinic Dept: [phone] and follow the prompts.   For any non-urgent questions, you may also contact your provider using MyChart. We now offer e-Visits for anyone 43 and older to request care online for non-urgent symptoms. For details visit [email or web address].   Also download the MyChart app! Go to the app store, search "MyChart", open the app, select [facility], and log in with your MyChart username and password.  Due to Covid, a mask is required upon entering the hospital/clinic. If you do not have a mask, one will be given to you upon arrival. For doctor visits, patients may have 1 support person aged 33 or older with them. For treatment visits, patients cannot have anyone with them due to current Covid guidelines and our immunocompromised population.   Magnesium Sulfate injection What is this medicine? MAGNESIUM SULFATE (mag NEE zee um SUL fate) is an electrolyte injection commonly used to treat low magnesium levels in your blood. It is also used to  prevent or control seizures in women with preeclampsia or eclampsia. This medicine may be used for other purposes; ask your health care provider or pharmacist if you have questions. What should I tell my health care provider before I take this medicine? They need to know if you have any of these conditions:  heart disease  history of  irregular heart beat  kidney disease  an unusual or allergic reaction to magnesium sulfate, medicines, foods, dyes, or preservatives  pregnant or trying to get pregnant  breast-feeding How should I use this medicine? This medicine is for infusion into a vein. It is given by a health care professional in a hospital or clinic setting. Talk to your pediatrician regarding the use of this medicine in children. While this drug may be prescribed for selected conditions, precautions do apply. Overdosage: If you think you have taken too much of this medicine contact a poison control center or emergency room at once. NOTE: This medicine is only for you. Do not share this medicine with others. What if I miss a dose? This does not apply. What may interact with this medicine? This medicine may interact with the following medications:  certain medicines for anxiety or sleep  certain medicines for seizures like phenobarbital  digoxin  medicines that relax muscles for surgery  narcotic medicines for pain This list may not describe all possible interactions. Give your health care provider a list of all the medicines, herbs, non-prescription drugs, or dietary supplements you use. Also tell them if you smoke, drink alcohol, or use illegal drugs. Some items may interact with your medicine. What should I watch for while using this medicine? Your condition will be monitored carefully while you are receiving this medicine. You may need blood work done while you are receiving this medicine. What side effects may I notice from receiving this medicine? Side effects that you should report to your doctor or health care professional as soon as possible:  allergic reactions like skin rash, itching or hives, swelling of the face, lips, or tongue  facial flushing  muscle weakness  signs and symptoms of low blood pressure like dizziness; feeling faint or lightheaded, falls; unusually weak or tired  signs  and symptoms of a dangerous change in heartbeat or heart rhythm like chest pain; dizziness; fast or irregular heartbeat; palpitations; breathing problems  sweating This list may not describe all possible side effects. Call your doctor for medical advice about side effects. You may report side effects to FDA at 1-800-FDA-1088. Where should I keep my medicine? This drug is given in a hospital or clinic and will not be stored at home. NOTE: This sheet is a summary. It may not cover all possible information. If you have questions about this medicine, talk to your doctor, pharmacist, or health care provider.  2021 Elsevier/Gold Standard (2015-09-11 12:31:42)

## 2020-07-26 ENCOUNTER — Inpatient Hospital Stay: Payer: Medicare (Managed Care)

## 2020-07-26 ENCOUNTER — Other Ambulatory Visit: Payer: Self-pay

## 2020-07-26 VITALS — BP 104/72 | HR 87 | Temp 98.6°F | Resp 18

## 2020-07-26 DIAGNOSIS — Z95828 Presence of other vascular implants and grafts: Secondary | ICD-10-CM

## 2020-07-26 DIAGNOSIS — Z5112 Encounter for antineoplastic immunotherapy: Secondary | ICD-10-CM | POA: Diagnosis not present

## 2020-07-26 DIAGNOSIS — C221 Intrahepatic bile duct carcinoma: Secondary | ICD-10-CM

## 2020-07-26 MED ORDER — HEPARIN SOD (PORK) LOCK FLUSH 100 UNIT/ML IV SOLN
500.0000 [IU] | Freq: Once | INTRAVENOUS | Status: AC | PRN
  Administered 2020-07-26: 500 [IU]
  Filled 2020-07-26: qty 5

## 2020-07-26 MED ORDER — MAGNESIUM SULFATE 2 GM/50ML IV SOLN
INTRAVENOUS | Status: AC
  Filled 2020-07-26: qty 100

## 2020-07-26 MED ORDER — SODIUM CHLORIDE 0.9% FLUSH
10.0000 mL | INTRAVENOUS | Status: DC | PRN
  Administered 2020-07-26: 10 mL
  Filled 2020-07-26: qty 10

## 2020-07-26 MED ORDER — SODIUM CHLORIDE 0.9 % IV SOLN
Freq: Once | INTRAVENOUS | Status: AC
  Filled 2020-07-26: qty 250

## 2020-07-26 MED ORDER — MAGNESIUM SULFATE 4 GM/100ML IV SOLN
4.0000 g | Freq: Once | INTRAVENOUS | Status: DC
  Filled 2020-07-26: qty 100

## 2020-07-26 MED ORDER — MAGNESIUM SULFATE 2 GM/50ML IV SOLN
2.0000 g | INTRAVENOUS | Status: AC
  Administered 2020-07-26 (×2): 2 g via INTRAVENOUS

## 2020-07-26 NOTE — Patient Instructions (Signed)
Magnesium Sulfate injection What is this medicine? MAGNESIUM SULFATE (mag NEE zee um SUL fate) is an electrolyte injection commonly used to treat low magnesium levels in your blood. It is also used to prevent or control seizures in women with preeclampsia or eclampsia. This medicine may be used for other purposes; ask your health care provider or pharmacist if you have questions. What should I tell my health care provider before I take this medicine? They need to know if you have any of these conditions:  heart disease  history of irregular heart beat  kidney disease  an unusual or allergic reaction to magnesium sulfate, medicines, foods, dyes, or preservatives  pregnant or trying to get pregnant  breast-feeding How should I use this medicine? This medicine is for infusion into a vein. It is given by a health care professional in a hospital or clinic setting. Talk to your pediatrician regarding the use of this medicine in children. While this drug may be prescribed for selected conditions, precautions do apply. Overdosage: If you think you have taken too much of this medicine contact a poison control center or emergency room at once. NOTE: This medicine is only for you. Do not share this medicine with others. What if I miss a dose? This does not apply. What may interact with this medicine? This medicine may interact with the following medications:  certain medicines for anxiety or sleep  certain medicines for seizures like phenobarbital  digoxin  medicines that relax muscles for surgery  narcotic medicines for pain This list may not describe all possible interactions. Give your health care provider a list of all the medicines, herbs, non-prescription drugs, or dietary supplements you use. Also tell them if you smoke, drink alcohol, or use illegal drugs. Some items may interact with your medicine. What should I watch for while using this medicine? Your condition will be  monitored carefully while you are receiving this medicine. You may need blood work done while you are receiving this medicine. What side effects may I notice from receiving this medicine? Side effects that you should report to your doctor or health care professional as soon as possible:  allergic reactions like skin rash, itching or hives, swelling of the face, lips, or tongue  facial flushing  muscle weakness  signs and symptoms of low blood pressure like dizziness; feeling faint or lightheaded, falls; unusually weak or tired  signs and symptoms of a dangerous change in heartbeat or heart rhythm like chest pain; dizziness; fast or irregular heartbeat; palpitations; breathing problems  sweating This list may not describe all possible side effects. Call your doctor for medical advice about side effects. You may report side effects to FDA at 1-800-FDA-1088. Where should I keep my medicine? This drug is given in a hospital or clinic and will not be stored at home. NOTE: This sheet is a summary. It may not cover all possible information. If you have questions about this medicine, talk to your doctor, pharmacist, or health care provider.  2021 Elsevier/Gold Standard (2015-09-11 12:31:42)  

## 2020-07-26 NOTE — Progress Notes (Signed)
Pt came in with 11.55mls left in pump bag of 5FU, per Dr.Feng, bolus was given to max amount and the rest of the chemo was wasted. Pt VSS and now in chair for mag infusion.

## 2020-08-07 ENCOUNTER — Other Ambulatory Visit: Payer: Self-pay

## 2020-08-07 ENCOUNTER — Inpatient Hospital Stay: Payer: Medicare (Managed Care)

## 2020-08-07 ENCOUNTER — Inpatient Hospital Stay: Payer: Medicare (Managed Care) | Admitting: Nurse Practitioner

## 2020-08-07 ENCOUNTER — Inpatient Hospital Stay: Payer: Medicare (Managed Care) | Attending: Hematology

## 2020-08-07 ENCOUNTER — Encounter: Payer: Self-pay | Admitting: Nurse Practitioner

## 2020-08-07 VITALS — BP 128/77 | HR 69 | Temp 98.1°F | Resp 18 | Wt 182.0 lb

## 2020-08-07 DIAGNOSIS — T451X5D Adverse effect of antineoplastic and immunosuppressive drugs, subsequent encounter: Secondary | ICD-10-CM | POA: Insufficient documentation

## 2020-08-07 DIAGNOSIS — R011 Cardiac murmur, unspecified: Secondary | ICD-10-CM | POA: Insufficient documentation

## 2020-08-07 DIAGNOSIS — Z95828 Presence of other vascular implants and grafts: Secondary | ICD-10-CM

## 2020-08-07 DIAGNOSIS — Z87891 Personal history of nicotine dependence: Secondary | ICD-10-CM | POA: Diagnosis not present

## 2020-08-07 DIAGNOSIS — E119 Type 2 diabetes mellitus without complications: Secondary | ICD-10-CM | POA: Diagnosis not present

## 2020-08-07 DIAGNOSIS — C778 Secondary and unspecified malignant neoplasm of lymph nodes of multiple regions: Secondary | ICD-10-CM | POA: Insufficient documentation

## 2020-08-07 DIAGNOSIS — Z7984 Long term (current) use of oral hypoglycemic drugs: Secondary | ICD-10-CM | POA: Diagnosis not present

## 2020-08-07 DIAGNOSIS — C787 Secondary malignant neoplasm of liver and intrahepatic bile duct: Secondary | ICD-10-CM | POA: Diagnosis present

## 2020-08-07 DIAGNOSIS — Z5111 Encounter for antineoplastic chemotherapy: Secondary | ICD-10-CM | POA: Diagnosis present

## 2020-08-07 DIAGNOSIS — Z86010 Personal history of colonic polyps: Secondary | ICD-10-CM | POA: Diagnosis not present

## 2020-08-07 DIAGNOSIS — M1990 Unspecified osteoarthritis, unspecified site: Secondary | ICD-10-CM | POA: Diagnosis not present

## 2020-08-07 DIAGNOSIS — C221 Intrahepatic bile duct carcinoma: Secondary | ICD-10-CM

## 2020-08-07 DIAGNOSIS — I10 Essential (primary) hypertension: Secondary | ICD-10-CM | POA: Diagnosis not present

## 2020-08-07 DIAGNOSIS — Z5112 Encounter for antineoplastic immunotherapy: Secondary | ICD-10-CM | POA: Diagnosis not present

## 2020-08-07 DIAGNOSIS — Z7189 Other specified counseling: Secondary | ICD-10-CM

## 2020-08-07 DIAGNOSIS — C19 Malignant neoplasm of rectosigmoid junction: Secondary | ICD-10-CM | POA: Diagnosis not present

## 2020-08-07 LAB — CBC WITH DIFFERENTIAL (CANCER CENTER ONLY)
Abs Immature Granulocytes: 0.02 10*3/uL (ref 0.00–0.07)
Basophils Absolute: 0 10*3/uL (ref 0.0–0.1)
Basophils Relative: 1 %
Eosinophils Absolute: 0.1 10*3/uL (ref 0.0–0.5)
Eosinophils Relative: 1 %
HCT: 32.1 % — ABNORMAL LOW (ref 36.0–46.0)
Hemoglobin: 10.9 g/dL — ABNORMAL LOW (ref 12.0–15.0)
Immature Granulocytes: 1 %
Lymphocytes Relative: 19 %
Lymphs Abs: 0.8 10*3/uL (ref 0.7–4.0)
MCH: 32 pg (ref 26.0–34.0)
MCHC: 34 g/dL (ref 30.0–36.0)
MCV: 94.1 fL (ref 80.0–100.0)
Monocytes Absolute: 0.6 10*3/uL (ref 0.1–1.0)
Monocytes Relative: 14 %
Neutro Abs: 2.7 10*3/uL (ref 1.7–7.7)
Neutrophils Relative %: 64 %
Platelet Count: 144 10*3/uL — ABNORMAL LOW (ref 150–400)
RBC: 3.41 MIL/uL — ABNORMAL LOW (ref 3.87–5.11)
RDW: 13.3 % (ref 11.5–15.5)
WBC Count: 4.2 10*3/uL (ref 4.0–10.5)
nRBC: 0 % (ref 0.0–0.2)

## 2020-08-07 LAB — MAGNESIUM: Magnesium: 1.4 mg/dL — ABNORMAL LOW (ref 1.7–2.4)

## 2020-08-07 LAB — CMP (CANCER CENTER ONLY)
ALT: 20 U/L (ref 0–44)
AST: 29 U/L (ref 15–41)
Albumin: 3.7 g/dL (ref 3.5–5.0)
Alkaline Phosphatase: 76 U/L (ref 38–126)
Anion gap: 12 (ref 5–15)
BUN: 14 mg/dL (ref 8–23)
CO2: 30 mmol/L (ref 22–32)
Calcium: 9.3 mg/dL (ref 8.9–10.3)
Chloride: 97 mmol/L — ABNORMAL LOW (ref 98–111)
Creatinine: 0.68 mg/dL (ref 0.44–1.00)
GFR, Estimated: >60 mL/min (ref >60)
Glucose, Bld: 102 mg/dL — ABNORMAL HIGH (ref 70–99)
Potassium: 3.3 mmol/L — ABNORMAL LOW (ref 3.5–5.1)
Sodium: 139 mmol/L (ref 135–145)
Total Bilirubin: 0.4 mg/dL (ref 0.3–1.2)
Total Protein: 7.5 g/dL (ref 6.5–8.1)

## 2020-08-07 MED ORDER — SODIUM CHLORIDE 0.9% FLUSH
10.0000 mL | Freq: Once | INTRAVENOUS | Status: AC
  Administered 2020-08-07: 10 mL
  Filled 2020-08-07: qty 10

## 2020-08-07 MED ORDER — SODIUM CHLORIDE 0.9 % IV SOLN
1800.0000 mg/m2 | INTRAVENOUS | Status: DC
  Administered 2020-08-07: 3550 mg via INTRAVENOUS
  Filled 2020-08-07: qty 71

## 2020-08-07 MED ORDER — SODIUM CHLORIDE 0.9% FLUSH
10.0000 mL | INTRAVENOUS | Status: DC | PRN
  Administered 2020-08-07: 10 mL
  Filled 2020-08-07: qty 10

## 2020-08-07 MED ORDER — MAGNESIUM SULFATE 2 GM/50ML IV SOLN
INTRAVENOUS | Status: AC
  Filled 2020-08-07: qty 50

## 2020-08-07 MED ORDER — PANITUMUMAB CHEMO INJECTION 100 MG/5ML
6.0000 mg/kg | Freq: Once | INTRAVENOUS | Status: AC
  Administered 2020-08-07: 500 mg via INTRAVENOUS
  Filled 2020-08-07: qty 20

## 2020-08-07 MED ORDER — LEUCOVORIN CALCIUM INJECTION 350 MG
400.0000 mg/m2 | Freq: Once | INTRAVENOUS | Status: AC
  Administered 2020-08-07: 792 mg via INTRAVENOUS
  Filled 2020-08-07: qty 39.6

## 2020-08-07 MED ORDER — POTASSIUM CHLORIDE CRYS ER 20 MEQ PO TBCR: 20.0000 meq | EXTENDED_RELEASE_TABLET | Freq: Two times a day (BID) | ORAL | 1 refills | Status: DC

## 2020-08-07 MED ORDER — PALONOSETRON HCL INJECTION 0.25 MG/5ML
INTRAVENOUS | Status: AC
  Filled 2020-08-07: qty 5

## 2020-08-07 MED ORDER — SODIUM CHLORIDE 0.9 % IV SOLN
10.0000 mg | Freq: Once | INTRAVENOUS | Status: AC
  Administered 2020-08-07: 10 mg via INTRAVENOUS
  Filled 2020-08-07: qty 10

## 2020-08-07 MED ORDER — DEXTROSE 5 % IV SOLN
Freq: Once | INTRAVENOUS | Status: AC
  Filled 2020-08-07: qty 250

## 2020-08-07 MED ORDER — MAGNESIUM SULFATE 4 GM/100ML IV SOLN
4.0000 g | Freq: Once | INTRAVENOUS | Status: AC
  Administered 2020-08-07: 4 g via INTRAVENOUS
  Filled 2020-08-07: qty 100

## 2020-08-07 MED ORDER — PALONOSETRON HCL INJECTION 0.25 MG/5ML
0.2500 mg | Freq: Once | INTRAVENOUS | Status: AC
  Administered 2020-08-07: 0.25 mg via INTRAVENOUS

## 2020-08-07 MED ORDER — OXALIPLATIN CHEMO INJECTION 100 MG/20ML
40.0000 mg/m2 | Freq: Once | INTRAVENOUS | Status: AC
  Administered 2020-08-07: 80 mg via INTRAVENOUS
  Filled 2020-08-07: qty 16

## 2020-08-07 MED ORDER — SODIUM CHLORIDE 0.9 % IV SOLN
Freq: Once | INTRAVENOUS | Status: AC
  Filled 2020-08-07: qty 250

## 2020-08-07 NOTE — Patient Instructions (Signed)
[facility]  Discharge Instructions: Thank you for choosing [facility] to provide your oncology and hematology care.   If you have a lab appointment with the [facility], please go directly to the [facility] and check in at the registration area.   Wear comfortable clothing and clothing appropriate for easy access to any Portacath or PICC line.   We strive to give you quality time with your provider. You may need to reschedule your appointment if you arrive late (15 or more minutes).  Arriving late affects you and other patients whose appointments are after yours.  Also, if you miss three or more appointments without notifying the office, you may be dismissed from the clinic at the provider's discretion.      For prescription refill requests, have your pharmacy contact our office and allow 72 hours for refills to be completed.    Today you received the following chemotherapy and/or immunotherapy agents Vectibix, Oxaliplatin, Leucovorin, 5FU     To help prevent nausea and vomiting after your treatment, we encourage you to take your nausea medication as directed.  BELOW ARE SYMPTOMS THAT SHOULD BE REPORTED IMMEDIATELY: . *FEVER GREATER THAN 100.4 F (38 C) OR HIGHER . *CHILLS OR SWEATING . *NAUSEA AND VOMITING THAT IS NOT CONTROLLED WITH YOUR NAUSEA MEDICATION . *UNUSUAL SHORTNESS OF BREATH . *UNUSUAL BRUISING OR BLEEDING . *URINARY PROBLEMS (pain or burning when urinating, or frequent urination) . *BOWEL PROBLEMS (unusual diarrhea, constipation, pain near the anus) . TENDERNESS IN MOUTH AND THROAT WITH OR WITHOUT PRESENCE OF ULCERS (sore throat, sores in mouth, or a toothache) . UNUSUAL RASH, SWELLING OR PAIN  . UNUSUAL VAGINAL DISCHARGE OR ITCHING   Items with * indicate a potential emergency and should be followed up as soon as possible or go to the Emergency Department if any problems should occur.  Please show the CHEMOTHERAPY ALERT  CARD or IMMUNOTHERAPY ALERT CARD at check-in to the Emergency Department and triage nurse.  Should you have questions after your visit or need to cancel or reschedule your appointment, please contact [facility]  Dept: [phone]  and follow the prompts.  Office hours are 8:00 a.m. to 4:30 p.m. Monday - Friday. Please note that voicemails left after 4:00 p.m. may not be returned until the following business day.  We are closed weekends and major holidays. You have access to a nurse at all times for urgent questions. Please call the main number to the clinic Dept: [phone] and follow the prompts.   For any non-urgent questions, you may also contact your provider using MyChart. We now offer e-Visits for anyone 103 and older to request care online for non-urgent symptoms. For details visit [email or web address].   Also download the MyChart app! Go to the app store, search "MyChart", open the app, select [facility], and log in with your MyChart username and password.  Due to Covid, a mask is required upon entering the hospital/clinic. If you do not have a mask, one will be given to you upon arrival. For doctor visits, patients may have 1 support person aged 45 or older with them. For treatment visits, patients cannot have anyone with them due to current Covid guidelines and our immunocompromised population.   Panitumumab Solution for Injection What is this medicine? PANITUMUMAB (pan i TOOM ue mab) is a monoclonal antibody. It is used to treat colorectal cancer. This medicine may be used for other purposes; ask your health care  provider or pharmacist if you have questions. COMMON BRAND NAME(S): Vectibix What should I tell my health care provider before I take this medicine? They need to know if you have any of these conditions:  eye disease, vision problems  low levels of calcium, magnesium, or potassium in the blood  lung or breathing disease, like  asthma  skin conditions or sensitivity  an unusual or allergic reaction to panitumumab, other medicines, foods, dyes, or preservatives  pregnant or trying to get pregnant  breast-feeding How should I use this medicine? This drug is given as an infusion into a vein. It is administered in a hospital or clinic by a specially trained health care professional. Talk to your pediatrician regarding the use of this medicine in children. Special care may be needed. Overdosage: If you think you have taken too much of this medicine contact a poison control center or emergency room at once. NOTE: This medicine is only for you. Do not share this medicine with others. What if I miss a dose? It is important not to miss your dose. Call your doctor or health care professional if you are unable to keep an appointment. What may interact with this medicine? Do not take this medicine with any of the following medications:  bevacizumab This list may not describe all possible interactions. Give your health care provider a list of all the medicines, herbs, non-prescription drugs, or dietary supplements you use. Also tell them if you smoke, drink alcohol, or use illegal drugs. Some items may interact with your medicine. What should I watch for while using this medicine? Visit your doctor for checks on your progress. This drug may make you feel generally unwell. This is not uncommon, as chemotherapy can affect healthy cells as well as cancer cells. Report any side effects. Continue your course of treatment even though you feel ill unless your doctor tells you to stop. This medicine can make you more sensitive to the sun. Keep out of the sun while receiving this medicine and for 2 months after the last dose. If you cannot avoid being in the sun, wear protective clothing and use sunscreen. Do not use sun lamps or tanning beds/booths. In some cases, you may be given additional medicines to help with side effects. Follow  all directions for their use. Call your doctor or health care professional for advice if you get a fever, chills or sore throat, or other symptoms of a cold or flu. Do not treat yourself. This drug decreases your body's ability to fight infections. Try to avoid being around people who are sick. Avoid taking products that contain aspirin, acetaminophen, ibuprofen, naproxen, or ketoprofen unless instructed by your doctor. These medicines may hide a fever. Do not become pregnant while taking this medicine and for 2 months after the last dose. Women should inform their doctor if they wish to become pregnant or think they might be pregnant. There is a potential for serious side effects to an unborn child. Talk to your health care professional or pharmacist for more information. Do not breast-feed an infant while taking this medicine or for 2 months after the last dose. What side effects may I notice from receiving this medicine? Side effects that you should report to your doctor or health care professional as soon as possible:  allergic reactions like skin rash, itching or hives, swelling of the face, lips, or tongue  breathing problems  changes in vision  eye pain  fast, irregular heartbeat  fever, chills  mouth sores  red spots on the skin  redness, blistering, peeling or loosening of the skin, including inside the mouth  signs and symptoms of kidney injury like trouble passing urine or change in the amount of urine  signs and symptoms of low blood pressure like dizziness; feeling faint or lightheaded, falls; unusually weak or tired  signs of low calcium like fast heartbeat, muscle cramps or muscle pain; pain, tingling, numbness in the hands or feet; seizures  signs and symptoms of low magnesium like muscle cramps, pain, or weakness; tremors; seizures; or fast, irregular heartbeat  signs and symptoms of low potassium like muscle cramps or muscle pain; chest pain; dizziness; feeling  faint or lightheaded, falls; palpitations; breathing problems; or fast, irregular heartbeat  swelling of the ankles, feet, hands Side effects that usually do not require medical attention (report to your doctor or health care professional if they continue or are bothersome):  changes in skin like acne, cracks, skin dryness  diarrhea  eyelash growth  headache  mouth sores  nail changes  nausea, vomiting This list may not describe all possible side effects. Call your doctor for medical advice about side effects. You may report side effects to FDA at 1-800-FDA-1088. Where should I keep my medicine? This drug is given in a hospital or clinic and will not be stored at home. NOTE: This sheet is a summary. It may not cover all possible information. If you have questions about this medicine, talk to your doctor, pharmacist, or health care provider.  2021 Elsevier/Gold Standard (2015-09-13 16:45:04)  Oxaliplatin Injection What is this medicine? OXALIPLATIN (ox AL i PLA tin) is a chemotherapy drug. It targets fast dividing cells, like cancer cells, and causes these cells to die. This medicine is used to treat cancers of the colon and rectum, and many other cancers. This medicine may be used for other purposes; ask your health care provider or pharmacist if you have questions. COMMON BRAND NAME(S): Eloxatin What should I tell my health care provider before I take this medicine? They need to know if you have any of these conditions:  heart disease  history of irregular heartbeat  liver disease  low blood counts, like white cells, platelets, or red blood cells  lung or breathing disease, like asthma  take medicines that treat or prevent blood clots  tingling of the fingers or toes, or other nerve disorder  an unusual or allergic reaction to oxaliplatin, other chemotherapy, other medicines, foods, dyes, or preservatives  pregnant or trying to get pregnant  breast-feeding How  should I use this medicine? This drug is given as an infusion into a vein. It is administered in a hospital or clinic by a specially trained health care professional. Talk to your pediatrician regarding the use of this medicine in children. Special care may be needed. Overdosage: If you think you have taken too much of this medicine contact a poison control center or emergency room at once. NOTE: This medicine is only for you. Do not share this medicine with others. What if I miss a dose? It is important not to miss a dose. Call your doctor or health care professional if you are unable to keep an appointment. What may interact with this medicine? Do not take this medicine with any of the following medications:  cisapride  dronedarone  pimozide  thioridazine This medicine may also interact with the following medications:  aspirin and aspirin-like medicines  certain medicines that treat or prevent blood clots like warfarin,  apixaban, dabigatran, and rivaroxaban  cisplatin  cyclosporine  diuretics  medicines for infection like acyclovir, adefovir, amphotericin B, bacitracin, cidofovir, foscarnet, ganciclovir, gentamicin, pentamidine, vancomycin  NSAIDs, medicines for pain and inflammation, like ibuprofen or naproxen  other medicines that prolong the QT interval (an abnormal heart rhythm)  pamidronate  zoledronic acid This list may not describe all possible interactions. Give your health care provider a list of all the medicines, herbs, non-prescription drugs, or dietary supplements you use. Also tell them if you smoke, drink alcohol, or use illegal drugs. Some items may interact with your medicine. What should I watch for while using this medicine? Your condition will be monitored carefully while you are receiving this medicine. You may need blood work done while you are taking this medicine. This medicine may make you feel generally unwell. This is not uncommon as chemotherapy  can affect healthy cells as well as cancer cells. Report any side effects. Continue your course of treatment even though you feel ill unless your healthcare professional tells you to stop. This medicine can make you more sensitive to cold. Do not drink cold drinks or use ice. Cover exposed skin before coming in contact with cold temperatures or cold objects. When out in cold weather wear warm clothing and cover your mouth and nose to warm the air that goes into your lungs. Tell your doctor if you get sensitive to the cold. Do not become pregnant while taking this medicine or for 9 months after stopping it. Women should inform their health care professional if they wish to become pregnant or think they might be pregnant. Men should not father a child while taking this medicine and for 6 months after stopping it. There is potential for serious side effects to an unborn child. Talk to your health care professional for more information. Do not breast-feed a child while taking this medicine or for 3 months after stopping it. This medicine has caused ovarian failure in some women. This medicine may make it more difficult to get pregnant. Talk to your health care professional if you are concerned about your fertility. This medicine has caused decreased sperm counts in some men. This may make it more difficult to father a child. Talk to your health care professional if you are concerned about your fertility. This medicine may increase your risk of getting an infection. Call your health care professional for advice if you get a fever, chills, or sore throat, or other symptoms of a cold or flu. Do not treat yourself. Try to avoid being around people who are sick. Avoid taking medicines that contain aspirin, acetaminophen, ibuprofen, naproxen, or ketoprofen unless instructed by your health care professional. These medicines may hide a fever. Be careful brushing or flossing your teeth or using a toothpick because you  may get an infection or bleed more easily. If you have any dental work done, tell your dentist you are receiving this medicine. What side effects may I notice from receiving this medicine? Side effects that you should report to your doctor or health care professional as soon as possible:  allergic reactions like skin rash, itching or hives, swelling of the face, lips, or tongue  breathing problems  cough  low blood counts - this medicine may decrease the number of white blood cells, red blood cells, and platelets. You may be at increased risk for infections and bleeding  nausea, vomiting  pain, redness, or irritation at site where injected  pain, tingling, numbness in the hands or  feet  signs and symptoms of bleeding such as bloody or black, tarry stools; red or dark brown urine; spitting up blood or brown material that looks like coffee grounds; red spots on the skin; unusual bruising or bleeding from the eyes, gums, or nose  signs and symptoms of a dangerous change in heartbeat or heart rhythm like chest pain; dizziness; fast, irregular heartbeat; palpitations; feeling faint or lightheaded; falls  signs and symptoms of infection like fever; chills; cough; sore throat; pain or trouble passing urine  signs and symptoms of liver injury like dark yellow or brown urine; general ill feeling or flu-like symptoms; light-colored stools; loss of appetite; nausea; right upper belly pain; unusually weak or tired; yellowing of the eyes or skin  signs and symptoms of low red blood cells or anemia such as unusually weak or tired; feeling faint or lightheaded; falls  signs and symptoms of muscle injury like dark urine; trouble passing urine or change in the amount of urine; unusually weak or tired; muscle pain; back pain Side effects that usually do not require medical attention (report to your doctor or health care professional if they continue or are bothersome):  changes in  taste  diarrhea  gas  hair loss  loss of appetite  mouth sores This list may not describe all possible side effects. Call your doctor for medical advice about side effects. You may report side effects to FDA at 1-800-FDA-1088. Where should I keep my medicine? This drug is given in a hospital or clinic and will not be stored at home. NOTE: This sheet is a summary. It may not cover all possible information. If you have questions about this medicine, talk to your doctor, pharmacist, or health care provider.  2021 Elsevier/Gold Standard (2018-07-13 12:20:35)  Leucovorin injection What is this medicine? LEUCOVORIN (loo koe VOR in) is used to prevent or treat the harmful effects of some medicines. This medicine is used to treat anemia caused by a low amount of folic acid in the body. It is also used with 5-fluorouracil (5-FU) to treat colon cancer. This medicine may be used for other purposes; ask your health care provider or pharmacist if you have questions. What should I tell my health care provider before I take this medicine? They need to know if you have any of these conditions:  anemia from low levels of vitamin B-12 in the blood  an unusual or allergic reaction to leucovorin, folic acid, other medicines, foods, dyes, or preservatives  pregnant or trying to get pregnant  breast-feeding How should I use this medicine? This medicine is for injection into a muscle or into a vein. It is given by a health care professional in a hospital or clinic setting. Talk to your pediatrician regarding the use of this medicine in children. Special care may be needed. Overdosage: If you think you have taken too much of this medicine contact a poison control center or emergency room at once. NOTE: This medicine is only for you. Do not share this medicine with others. What if I miss a dose? This does not apply. What may interact with this  medicine?  capecitabine  fluorouracil  phenobarbital  phenytoin  primidone  trimethoprim-sulfamethoxazole This list may not describe all possible interactions. Give your health care provider a list of all the medicines, herbs, non-prescription drugs, or dietary supplements you use. Also tell them if you smoke, drink alcohol, or use illegal drugs. Some items may interact with your medicine. What should I watch for  while using this medicine? Your condition will be monitored carefully while you are receiving this medicine. This medicine may increase the side effects of 5-fluorouracil, 5-FU. Tell your doctor or health care professional if you have diarrhea or mouth sores that do not get better or that get worse. What side effects may I notice from receiving this medicine? Side effects that you should report to your doctor or health care professional as soon as possible:  allergic reactions like skin rash, itching or hives, swelling of the face, lips, or tongue  breathing problems  fever, infection  mouth sores  unusual bleeding or bruising  unusually weak or tired Side effects that usually do not require medical attention (report to your doctor or health care professional if they continue or are bothersome):  constipation or diarrhea  loss of appetite  nausea, vomiting This list may not describe all possible side effects. Call your doctor for medical advice about side effects. You may report side effects to FDA at 1-800-FDA-1088. Where should I keep my medicine? This drug is given in a hospital or clinic and will not be stored at home. NOTE: This sheet is a summary. It may not cover all possible information. If you have questions about this medicine, talk to your doctor, pharmacist, or health care provider.  2021 Elsevier/Gold Standard (2007-08-30 16:50:29)  Fluorouracil, 5-FU injection What is this medicine? FLUOROURACIL, 5-FU (flure oh YOOR a sil) is a chemotherapy drug.  It slows the growth of cancer cells. This medicine is used to treat many types of cancer like breast cancer, colon or rectal cancer, pancreatic cancer, and stomach cancer. This medicine may be used for other purposes; ask your health care provider or pharmacist if you have questions. COMMON BRAND NAME(S): Adrucil What should I tell my health care provider before I take this medicine? They need to know if you have any of these conditions:  blood disorders  dihydropyrimidine dehydrogenase (DPD) deficiency  infection (especially a virus infection such as chickenpox, cold sores, or herpes)  kidney disease  liver disease  malnourished, poor nutrition  recent or ongoing radiation therapy  an unusual or allergic reaction to fluorouracil, other chemotherapy, other medicines, foods, dyes, or preservatives  pregnant or trying to get pregnant  breast-feeding How should I use this medicine? This drug is given as an infusion or injection into a vein. It is administered in a hospital or clinic by a specially trained health care professional. Talk to your pediatrician regarding the use of this medicine in children. Special care may be needed. Overdosage: If you think you have taken too much of this medicine contact a poison control center or emergency room at once. NOTE: This medicine is only for you. Do not share this medicine with others. What if I miss a dose? It is important not to miss your dose. Call your doctor or health care professional if you are unable to keep an appointment. What may interact with this medicine? Do not take this medicine with any of the following medications:  live virus vaccines This medicine may also interact with the following medications:  medicines that treat or prevent blood clots like warfarin, enoxaparin, and dalteparin This list may not describe all possible interactions. Give your health care provider a list of all the medicines, herbs, non-prescription  drugs, or dietary supplements you use. Also tell them if you smoke, drink alcohol, or use illegal drugs. Some items may interact with your medicine. What should I watch for while using this  medicine? Visit your doctor for checks on your progress. This drug may make you feel generally unwell. This is not uncommon, as chemotherapy can affect healthy cells as well as cancer cells. Report any side effects. Continue your course of treatment even though you feel ill unless your doctor tells you to stop. In some cases, you may be given additional medicines to help with side effects. Follow all directions for their use. Call your doctor or health care professional for advice if you get a fever, chills or sore throat, or other symptoms of a cold or flu. Do not treat yourself. This drug decreases your body's ability to fight infections. Try to avoid being around people who are sick. This medicine may increase your risk to bruise or bleed. Call your doctor or health care professional if you notice any unusual bleeding. Be careful brushing and flossing your teeth or using a toothpick because you may get an infection or bleed more easily. If you have any dental work done, tell your dentist you are receiving this medicine. Avoid taking products that contain aspirin, acetaminophen, ibuprofen, naproxen, or ketoprofen unless instructed by your doctor. These medicines may hide a fever. Do not become pregnant while taking this medicine. Women should inform their doctor if they wish to become pregnant or think they might be pregnant. There is a potential for serious side effects to an unborn child. Talk to your health care professional or pharmacist for more information. Do not breast-feed an infant while taking this medicine. Men should inform their doctor if they wish to father a child. This medicine may lower sperm counts. Do not treat diarrhea with over the counter products. Contact your doctor if you have diarrhea that  lasts more than 2 days or if it is severe and watery. This medicine can make you more sensitive to the sun. Keep out of the sun. If you cannot avoid being in the sun, wear protective clothing and use sunscreen. Do not use sun lamps or tanning beds/booths. What side effects may I notice from receiving this medicine? Side effects that you should report to your doctor or health care professional as soon as possible:  allergic reactions like skin rash, itching or hives, swelling of the face, lips, or tongue  low blood counts - this medicine may decrease the number of white blood cells, red blood cells and platelets. You may be at increased risk for infections and bleeding.  signs of infection - fever or chills, cough, sore throat, pain or difficulty passing urine  signs of decreased platelets or bleeding - bruising, pinpoint red spots on the skin, black, tarry stools, blood in the urine  signs of decreased red blood cells - unusually weak or tired, fainting spells, lightheadedness  breathing problems  changes in vision  chest pain  mouth sores  nausea and vomiting  pain, swelling, redness at site where injected  pain, tingling, numbness in the hands or feet  redness, swelling, or sores on hands or feet  stomach pain  unusual bleeding Side effects that usually do not require medical attention (report to your doctor or health care professional if they continue or are bothersome):  changes in finger or toe nails  diarrhea  dry or itchy skin  hair loss  headache  loss of appetite  sensitivity of eyes to the light  stomach upset  unusually teary eyes This list may not describe all possible side effects. Call your doctor for medical advice about side effects. You may report  side effects to FDA at 1-800-FDA-1088. Where should I keep my medicine? This drug is given in a hospital or clinic and will not be stored at home. NOTE: This sheet is a summary. It may not cover all  possible information. If you have questions about this medicine, talk to your doctor, pharmacist, or health care provider.  2021 Elsevier/Gold Standard (2019-01-24 15:00:03)

## 2020-08-07 NOTE — Progress Notes (Signed)
[facility]   Telephone:([phone]) [phone] Fax:(336) [phone]   Clinic Follow up Note   Patient Care Team: [name], MD as PCP - General (Internal Medicine) [name], RN as Oncology Nurse Navigator [name], MD as Consulting Physician (Oncology) [name], MD as Consulting Physician (Gastroenterology) 08/07/2020  CHIEF COMPLAINT: Follow up colon cancer   SUMMARY OF ONCOLOGIC HISTORY: Oncology History Overview Note  Cancer Staging No matching staging information was found for the patient.    metastatic colon cancer to liver  06/20/2019 Procedure   Colonoscopy by Dr [name] 06/20/19  IMPRESSION -Seven 3 to 10 mm polyps in the sigmoid colon, in the transverse colon and in the escending colon, removed with a cold snare. Resected and retrireved.  -One 33m polyp in the descending colon. Biopsies. Tattoes.  -Mediaum sized lipoma in the ascending colon.   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 See Comment B. Colon, Ascending, polyp, Polypectomy:  -TUBULAR ADENOMA -No high grade dysplasia or malignancy.  C. Colon, TRansverse, Polyo, polectomy:  -TUBULAR ADENOMA -No high grade dysplasia or malignancy.  D. Colon, Sigmoid, Polyp, Polypectomy:  -HYPERPLASTIC POLYP   11/07/2019 Imaging   [place]oreaAbdomen 11/07/19  IMPRESSION: 1. Two solid masses in the liver are nonspecific. Recommend MRI abdomen with and without contrast for further evaluation.   12/15/2019 Imaging   MRI Abdomen 12/15/19  IMPRESSION: 1. There are two large masses in the liver with appearance favoring metastatic disease or hepatocellular carcinoma or cholangiocarcinoma. A benign etiology is highly unlikely given the enhancement pattern and associated adenopathy. 2. Considerable porta hepatis and retroperitoneal adenopathy. Some of the confluent porta hepatis tumor is potentially infiltrative and abuts the  pancreatic body along its right upper margin, making it difficult to completely exclude the possibility of pancreatic adenocarcinoma primary. Possibilities helpful in further workup might include tissue diagnosis, endoscopic ultrasound, or nuclear medicine PET-CT. 3. Pancreas divisum. 4. Lumbar spondylosis and degenerative disc disease. 5. Despite efforts by the technologist and patient, motion artifact is present on today's exam and could not be eliminated. This reduces exam sensitivity and specificity.   12/22/2019 Procedure   Upper Endoscopy by Dr [name]10/15/21  IMPRESSION - One lymph node was visualized and measured in the porta hepatis region. Fine needle aspiration performed    12/22/2019 Initial Biopsy   A. LIVER, PORTA HEPATIS MASS, FINE NEEDLE 12/22/19 ASPIRATION:  FINAL MICROSCOPIC DIAGNOSIS:  - Malignant cells consistent with metastatic adenocarcinoma   01/01/2020 Initial Diagnosis   [place]angi[place]   01/08/2020 Initial Biopsy   FINAL MICROSCOPIC DIAGNOSIS:   A. LIVER, LEFT LOBE, BIOPSY:  - Metastatic adenocarcinoma, consistent with a colorectal primary.  See  comment      COMMENT:   Immunohistochemical stains show the tumor cells are positive for CK20  and CDX2 but negative for CK7, consistent with above interpretation.  Dr. [name] notified on 01/10/2020   01/08/2020 Genetic Testing   [name]  [name] wildtype and [name]/[name] mutations which make her eligible for target biological agent Vectibix.   01/24/2020 Procedure   PAC placed 01/24/20   01/25/2020 -  Chemotherapy   first line FOLFOX starting 01/25/2020, Vextibix  added with C2 (02/06/20)   06/20/2020 Imaging   CT CAP  IMPRESSION: 1. Continued interval reduction in size and conspicuity of a subsolid nodule of the peripheral left upper lobe. 2. Unchanged prominent pretracheal and subcarinal lymph nodes. 3. Redemonstrated partially calcified low-attenuation liver  masses, slightly decreased in size compared to prior examination. 4. Slight interval decrease in size of a portacaval lymph node or conglomerate and retroperitoneal lymph nodes. 5. Findings are consistent with continued treatment response of nodal, pulmonary, and hepatic metastatic disease. 6. Coronary artery disease.   Aortic Atherosclerosis (ICD10-I70.0).       CURRENT THERAPY:   INTERVAL HISTORY: Lindsay [name] returns for follow up and treatment as scheduled. She completed cycle 14 FOLFOX/vectibix on 07/26/20 with dose reduced oxaliplatin.  Cold sensitivity and mild neuropathy are unchanged, she can still use her hands.  Energy is adequate, appetite fluctuates.  She has occasional soreness in her mouth without ulcers, uses mouth rinse which is effective.  Manages constipation with laxative as needed, denies N/V or abdominal pain.  GERD is worse with 5-FU pump, Tums help.  Denies fever, chills, cough, chest pain, dyspnea, signs of thrombosis, bleeding, or other new concerns.   MEDICAL HISTORY:  Past Medical History:  Diagnosis Date  . Arthritis   . Diabetes mellitus without complication ([place])   . Hypertension     SURGICAL HISTORY: Past Surgical History:  Procedure Laterality Date  . ABDOMINAL HYSTERECTOMY    . ANTERIOR AND POSTERIOR REPAIR WITH SACROSPINOUS FIXATION N/A 07/16/2016   Procedure: ANTERIOR AND POSTERIOR REPAIR WITH SACROSPINOUS FIXATION;  Surgeon: Everlene Farrier, MD;  Location: [facility];  Service: Gynecology;  Laterality: N/A;  . CYSTOSCOPY  07/16/2016   Procedure: CYSTOSCOPY;  Surgeon: Everlene Farrier, MD;  Location: [facility];  Service: Gynecology;;  . ESOPHAGOGASTRODUODENOSCOPY (EGD) WITH PROPOFOL N/A 12/22/2019   Procedure: ESOPHAGOGASTRODUODENOSCOPY (EGD) WITH PROPOFOL;  Surgeon: [name], MD;  Location: [facility];  Service: Endoscopy;  Laterality: N/A;  . FINE NEEDLE ASPIRATION N/A 12/22/2019   Procedure: FINE NEEDLE ASPIRATION (FNA) LINEAR;  Surgeon: [name], MD;  Location: [facility];  Service: Endoscopy;  Laterality: N/A;  . IR IMAGING GUIDED PORT INSERTION  01/24/2020  . LAPAROSCOPIC VAGINAL HYSTERECTOMY WITH SALPINGECTOMY Bilateral 07/16/2016   Procedure: LAPAROSCOPIC ASSISTED VAGINAL HYSTERECTOMY WITH SALPINGECTOMY;  Surgeon: Everlene Farrier, MD;  Location: [facility];  Service: Gynecology;  Laterality: Bilateral;  . MYOMECTOMY ABDOMINAL APPROACH    . THYROID SURGERY    . tyroid    . UPPER ESOPHAGEAL ENDOSCOPIC ULTRASOUND (EUS) N/A 12/22/2019   Procedure: UPPER ESOPHAGEAL ENDOSCOPIC ULTRASOUND (EUS);  Surgeon: [name], MD;  Location: [facility];  Service: Endoscopy;  Laterality: N/A;    I have reviewed the social history and family history with the patient and they are unchanged from previous note.  ALLERGIES:  has No Known Allergies.  MEDICATIONS:  Current Outpatient Medications  Medication Sig Dispense Refill  . atorvastatin (LIPITOR) 20 MG tablet Take 20 mg by mouth at bedtime.   1  . clindamycin (CLINDAGEL) 1 % gel Apply topically 2 (two) times daily. 60 g 2  . clobetasol cream (TEMOVATE) 4.69 % Apply 1 application topically daily.    Marland Kitchen esomeprazole (NEXIUM) 40 MG capsule Take 1 capsule (40 mg total) by mouth daily before breakfast. 30 capsule 3  . lidocaine-prilocaine (EMLA) cream Apply 1 application topically as needed. 30 g 1  . lisinopril-hydrochlorothiazide (ZESTORETIC) 20-12.5 MG tablet Take 1 tablet by mouth daily with breakfast.     . magic mouthwash w/lidocaine SOLN Take 5 mLs by mouth 4 (four) times daily. 475 mL 2  . magnesium oxide (MAG-OX) 400 (241.3 Mg) MG tablet Take 1 tablet (400 mg total) by mouth in the morning, at noon, and at bedtime. 90 tablet 2  . magnesium  oxide (MAG-OX) 400 MG tablet Take 1 tablet by mouth 2 (two) times daily.    . ondansetron (ZOFRAN) 8 MG tablet Take 1 tablet (8 mg total) by mouth every 8 (eight) hours as needed for nausea or vomiting. 20 tablet 1  . potassium chloride SA  (KLOR-CON) 20 MEQ tablet Take 1 tablet (20 mEq total) by mouth 2 (two) times daily. 60 tablet 1  . prochlorperazine (COMPAZINE) 10 MG tablet Take 1 tablet (10 mg total) by mouth every 6 (six) hours as needed for nausea or vomiting. 30 tablet 2  . spironolactone (ALDACTONE) 25 MG tablet Take 25 mg by mouth daily with breakfast.    . traMADol (ULTRAM) 50 MG tablet Take 1 tablet (50 mg total) by mouth every 6 (six) hours as needed. 15 tablet 0  . valACYclovir (VALTREX) 1000 MG tablet Take 1 tablet (1,000 mg total) by mouth 2 (two) times daily. 10 tablet 0  . aspirin EC 81 MG tablet Take 1 tablet (81 mg total) by mouth daily. 90 tablet 3  . doxycycline (VIBRA-TABS) 100 MG tablet Take 1 tablet (100 mg total) by mouth 2 (two) times daily. 60 tablet 1   No current facility-administered medications for this visit.    PHYSICAL EXAMINATION: ECOG PERFORMANCE STATUS: 1 - Symptomatic but completely ambulatory  Vitals:   08/07/20 0856  BP: 128/77  Pulse: 69  Resp: 18  Temp: 98.1 F (36.7 C)  SpO2: 100%   Filed Weights   08/07/20 0856  Weight: 182 lb (82.6 kg)    GENERAL:alert, no distress and comfortable SKIN: No significant rash.  Palms with hyperpigmentation EYES:  sclera clear OROPHARYNX: Discolored tongue.  No thrush or ulcers LUNGS:  normal breathing effort HEART:  no lower extremity edema NEURO: alert & oriented x 3 with fluent speech, no focal motor deficits PAC without erythema  LABORATORY DATA:  I have reviewed the data as listed CBC Latest Ref Rng & Units 08/07/2020 07/24/2020 07/10/2020  WBC 4.0 - 10.5 K/uL 4.2 4.1 3.3(L)  Hemoglobin 12.0 - 15.0 g/dL 10.9(L) 10.7(L) 10.6(L)  Hematocrit 36.0 - 46.0 % 32.1(L) 31.0(L) 31.3(L)  Platelets 150 - 400 K/uL 144(L) 141(L) 167     CMP Latest Ref Rng & Units 08/07/2020 07/24/2020 07/10/2020  Glucose 70 - 99 mg/dL 102(H) 111(H) 118(H)  BUN 8 - 23 mg/dL 14 10 10   Creatinine 0.44 - 1.00 mg/dL 0.68 0.64 0.67  Sodium 135 - 145 mmol/L 139 140 139   Potassium 3.5 - 5.1 mmol/L 3.3(L) 3.2(L) 3.4(L)  Chloride 98 - 111 mmol/L 97(L) 100 99  CO2 22 - 32 mmol/L 30 30 30   Calcium 8.9 - 10.3 mg/dL 9.3 9.1 9.4  Total Protein 6.5 - 8.1 g/dL 7.5 7.5 7.5  Total Bilirubin 0.3 - 1.2 mg/dL 0.4 0.4 0.3  Alkaline Phos 38 - 126 U/L 76 83 90  AST 15 - 41 U/L 29 29 28   ALT 0 - 44 U/L 20 17 20       RADIOGRAPHIC STUDIES: I have personally reviewed the radiological images as listed and agreed with the findings in the report. No results found.   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 MSS, [name]/[name] wildtype -Her 12/15/19 MRI abdomen showed 2 large liver masses indicating metastatic disease and bulkyporta hepatis and retroperitoneal adenopathy. -Her EUS from 12/22/19 showed 3.5cm LN in porta hepatis regionand biopsy confirmed metastatic adenocarcinoma.Preliminary cytology sample is not adequate for any additional IHC study  for the origin of tumor, the morphology does not support HCC. -Her EGD was negative formalignancy. Her colonoscopy in March 2021 didshow a large 2 cm polyps in descending colon, biopsy showed high-grade dysplasia. -Liver biopsyconfirmed adenocarcinoma, immunostain showed positive CDX2 and CK20, negative CK7, supporting primary colorectalcancer  -PET scan showedknownliver metastasis, and diffuse thoracic and abdominaladenopathy.There isa hypermetabolic 1.8 cm pulmonary nodule in left upper lobe, and focal hypermetabolic lesion in the splenic fixture of the colon,concerning for primary tumor. -Sheunderwent repeat colonoscopy and biopsy by Dr. Lyanne Co IHC testing with TTF-1 shows focal staining, which can be seen in 5-10% of colorectal primary cancers as well as lung cancer. However given the above, the overall picture is still consistent with colorectal primary. -She began first line systemic FOLFOX on 11/18, goal is palliative.  Toleratingwell -FOshows [name]/[name] wild-type, she is a candidate for EGFR inhibitor Panitumumabwhich was added with C2 -Restaging CT's 04/01/2020 and 06/20/20 showed interval reduction in size of liver, lung, and nodal metastases.CEA normalized as of 04/16/2020  2. Chronic Lower abdominal pain, Nausea  -She has had chronic low abdominal pain for 5 years ranging up to 4 times a week. She notes her pain worsened in the last 3 years, but no work-up until 2021.  -Her pain is 5/10 at most but when high not managed on Motrin. -she has tramadol but does not take it much -encouraged her to use Ensure/boost if she has decreased appetite -f/up with dietician -abdominal pain improved on chemo.mild an intermittent. Does not require medication   3. 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 Remains functional without difficulty. Oxali has been dose-reduced further, stable   4. 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  -She lives with husband and has brothers and sister in and out of town. Her only child passed in recent years.   5. Goals of care -we discussed the incurable but treatable nature of her stage IV cancer and general prognosis. She understands the prognosis is poor if she does not tolerate or respond well to chemo -she understands the goal is palliative  -full code  6. Skin and nail changes -secondary to chemo (likely 5FU) -keep hands moisturized, reviewed symptom management   7. Blood tinged sputum  -Reported on 07/10/20, has been ongoing but intermittent -on ASA for prevention, no other AC -no clinical signs of sinusitis. No respiratory symptoms -hold aspirin and monitor  -not discussed today 08/07/20  Disposition: Lindsay [name] appears stable.  She continues FOLFOX  and panitumumab, with dose reduced oxaliplatin.  She tolerates treatment well with mild neuropathy, constipation, and GERD.  Side effects are well managed with supportive care at home.  She is able to recover and function well.  There is no clinical evidence of disease progression.  Labs reviewed, CBC and CMP are stable.  K3.3, will begin oral K 1 tab twice daily, continue oral mag 3 times daily, and receive IV mag today. Most recent CEA < 1.   Proceed with cycle 14 FOLFOX and panitumumab today as planned, continue oxali 40 mg/m2. F/up and next cycle in 2 weeks. We discussed stopping oxali soon if neuropathy progresses.   Plan to scan in July or August as long as she is clinically doing well and CEA remains normal.   All questions were answered. The patient knows to call  the clinic with any problems, questions or concerns. No barriers to learning were detected.     Alla Feeling, NP 08/07/20

## 2020-08-08 ENCOUNTER — Telehealth: Payer: Self-pay | Admitting: Hematology

## 2020-08-08 NOTE — Telephone Encounter (Signed)
Scheduled follow-up appointments per 6/1 los. Patient is aware.

## 2020-08-09 ENCOUNTER — Other Ambulatory Visit: Payer: Self-pay

## 2020-08-09 ENCOUNTER — Inpatient Hospital Stay: Payer: Medicare (Managed Care)

## 2020-08-09 VITALS — BP 129/73 | HR 84

## 2020-08-09 DIAGNOSIS — Z5112 Encounter for antineoplastic immunotherapy: Secondary | ICD-10-CM | POA: Diagnosis not present

## 2020-08-09 DIAGNOSIS — C221 Intrahepatic bile duct carcinoma: Secondary | ICD-10-CM

## 2020-08-09 MED ORDER — HEPARIN SOD (PORK) LOCK FLUSH 100 UNIT/ML IV SOLN
500.0000 [IU] | Freq: Once | INTRAVENOUS | Status: AC | PRN
  Administered 2020-08-09: 500 [IU]
  Filled 2020-08-09: qty 5

## 2020-08-09 MED ORDER — SODIUM CHLORIDE 0.9% FLUSH
10.0000 mL | INTRAVENOUS | Status: DC | PRN
  Administered 2020-08-09: 10 mL
  Filled 2020-08-09: qty 10

## 2020-08-09 NOTE — Patient Instructions (Signed)

## 2020-08-21 ENCOUNTER — Other Ambulatory Visit: Payer: Medicare Other

## 2020-08-21 ENCOUNTER — Inpatient Hospital Stay: Payer: Medicare (Managed Care)

## 2020-08-21 ENCOUNTER — Other Ambulatory Visit: Payer: Self-pay

## 2020-08-21 ENCOUNTER — Encounter: Payer: Self-pay | Admitting: Nurse Practitioner

## 2020-08-21 ENCOUNTER — Ambulatory Visit: Payer: Medicare Other

## 2020-08-21 ENCOUNTER — Ambulatory Visit: Payer: Medicare Other | Admitting: Hematology

## 2020-08-21 ENCOUNTER — Inpatient Hospital Stay (HOSPITAL_BASED_OUTPATIENT_CLINIC_OR_DEPARTMENT_OTHER): Payer: Medicare (Managed Care) | Admitting: Nurse Practitioner

## 2020-08-21 VITALS — BP 131/81 | HR 69 | Temp 97.6°F | Resp 17 | Ht 66.0 in | Wt 178.6 lb

## 2020-08-21 DIAGNOSIS — C221 Intrahepatic bile duct carcinoma: Secondary | ICD-10-CM | POA: Diagnosis not present

## 2020-08-21 DIAGNOSIS — Z7189 Other specified counseling: Secondary | ICD-10-CM

## 2020-08-21 DIAGNOSIS — Z95828 Presence of other vascular implants and grafts: Secondary | ICD-10-CM

## 2020-08-21 DIAGNOSIS — Z5112 Encounter for antineoplastic immunotherapy: Secondary | ICD-10-CM | POA: Diagnosis not present

## 2020-08-21 DIAGNOSIS — L299 Pruritus, unspecified: Secondary | ICD-10-CM

## 2020-08-21 LAB — CMP (CANCER CENTER ONLY)
ALT: 21 U/L (ref 0–44)
AST: 27 U/L (ref 15–41)
Albumin: 3.9 g/dL (ref 3.5–5.0)
Alkaline Phosphatase: 99 U/L (ref 38–126)
Anion gap: 11 (ref 5–15)
BUN: 19 mg/dL (ref 8–23)
CO2: 21 mmol/L — ABNORMAL LOW (ref 22–32)
Calcium: 9.9 mg/dL (ref 8.9–10.3)
Chloride: 104 mmol/L (ref 98–111)
Creatinine: 0.77 mg/dL (ref 0.44–1.00)
GFR, Estimated: >60 mL/min (ref >60)
Glucose, Bld: 110 mg/dL — ABNORMAL HIGH (ref 70–99)
Potassium: 4.9 mmol/L (ref 3.5–5.1)
Sodium: 136 mmol/L (ref 135–145)
Total Bilirubin: 0.4 mg/dL (ref 0.3–1.2)
Total Protein: 8.2 g/dL — ABNORMAL HIGH (ref 6.5–8.1)

## 2020-08-21 LAB — CBC WITH DIFFERENTIAL (CANCER CENTER ONLY)
Abs Immature Granulocytes: 0.02 10*3/uL (ref 0.00–0.07)
Basophils Absolute: 0 10*3/uL (ref 0.0–0.1)
Basophils Relative: 1 %
Eosinophils Absolute: 0.1 10*3/uL (ref 0.0–0.5)
Eosinophils Relative: 1 %
HCT: 34.9 % — ABNORMAL LOW (ref 36.0–46.0)
Hemoglobin: 11.7 g/dL — ABNORMAL LOW (ref 12.0–15.0)
Immature Granulocytes: 0 %
Lymphocytes Relative: 17 %
Lymphs Abs: 0.9 10*3/uL (ref 0.7–4.0)
MCH: 31.7 pg (ref 26.0–34.0)
MCHC: 33.5 g/dL (ref 30.0–36.0)
MCV: 94.6 fL (ref 80.0–100.0)
Monocytes Absolute: 0.6 10*3/uL (ref 0.1–1.0)
Monocytes Relative: 12 %
Neutro Abs: 3.7 10*3/uL (ref 1.7–7.7)
Neutrophils Relative %: 69 %
Platelet Count: 159 10*3/uL (ref 150–400)
RBC: 3.69 MIL/uL — ABNORMAL LOW (ref 3.87–5.11)
RDW: 13.6 % (ref 11.5–15.5)
WBC Count: 5.3 10*3/uL (ref 4.0–10.5)
nRBC: 0 % (ref 0.0–0.2)

## 2020-08-21 LAB — MAGNESIUM: Magnesium: 1.4 mg/dL — ABNORMAL LOW (ref 1.7–2.4)

## 2020-08-21 LAB — CEA (IN HOUSE-CHCC): CEA (CHCC-In House): <1 ng/mL (ref 0.00–5.00)

## 2020-08-21 MED ORDER — LORATADINE 10 MG PO TABS
10.0000 mg | ORAL_TABLET | Freq: Once | ORAL | Status: AC
Start: 2020-08-21 — End: 2020-08-21
  Administered 2020-08-21: 10 mg via ORAL

## 2020-08-21 MED ORDER — SODIUM CHLORIDE 0.9 % IV SOLN
6.0000 mg/kg | Freq: Once | INTRAVENOUS | Status: AC
  Administered 2020-08-21: 500 mg via INTRAVENOUS
  Filled 2020-08-21: qty 20

## 2020-08-21 MED ORDER — SODIUM CHLORIDE 0.9 % IV SOLN
10.0000 mg | Freq: Once | INTRAVENOUS | Status: AC
  Administered 2020-08-21: 10 mg via INTRAVENOUS
  Filled 2020-08-21: qty 10

## 2020-08-21 MED ORDER — DEXTROSE 5 % IV SOLN
Freq: Once | INTRAVENOUS | Status: AC
  Filled 2020-08-21: qty 250

## 2020-08-21 MED ORDER — LORATADINE 10 MG PO TABS
ORAL_TABLET | ORAL | Status: AC
  Filled 2020-08-21: qty 1

## 2020-08-21 MED ORDER — PROCHLORPERAZINE MALEATE 10 MG PO TABS
ORAL_TABLET | ORAL | Status: AC
  Filled 2020-08-21: qty 1

## 2020-08-21 MED ORDER — SODIUM CHLORIDE 0.9% FLUSH
10.0000 mL | INTRAVENOUS | Status: DC | PRN
  Filled 2020-08-21: qty 10

## 2020-08-21 MED ORDER — PALONOSETRON HCL INJECTION 0.25 MG/5ML
INTRAVENOUS | Status: AC
  Filled 2020-08-21: qty 5

## 2020-08-21 MED ORDER — SODIUM CHLORIDE 0.9 % IV SOLN
1800.0000 mg/m2 | INTRAVENOUS | Status: DC
  Administered 2020-08-21: 3550 mg via INTRAVENOUS
  Filled 2020-08-21: qty 71

## 2020-08-21 MED ORDER — SODIUM CHLORIDE 0.9 % IV SOLN
Freq: Once | INTRAVENOUS | Status: AC
  Filled 2020-08-21: qty 250

## 2020-08-21 MED ORDER — MAGNESIUM SULFATE 4 GM/100ML IV SOLN
4.0000 g | Freq: Once | INTRAVENOUS | Status: AC
  Administered 2020-08-21: 4 g via INTRAVENOUS
  Filled 2020-08-21: qty 100

## 2020-08-21 MED ORDER — PALONOSETRON HCL INJECTION 0.25 MG/5ML
0.2500 mg | Freq: Once | INTRAVENOUS | Status: AC
  Administered 2020-08-21: 0.25 mg via INTRAVENOUS

## 2020-08-21 MED ORDER — OXALIPLATIN CHEMO INJECTION 100 MG/20ML
40.0000 mg/m2 | Freq: Once | INTRAVENOUS | Status: AC
  Administered 2020-08-21: 80 mg via INTRAVENOUS
  Filled 2020-08-21: qty 16

## 2020-08-21 MED ORDER — SODIUM CHLORIDE 0.9 % IV SOLN
Freq: Once | INTRAVENOUS | Status: DC
  Filled 2020-08-21: qty 250

## 2020-08-21 MED ORDER — LEUCOVORIN CALCIUM INJECTION 350 MG
400.0000 mg/m2 | Freq: Once | INTRAVENOUS | Status: AC
  Administered 2020-08-21: 792 mg via INTRAVENOUS
  Filled 2020-08-21: qty 39.6

## 2020-08-21 MED ORDER — HEPARIN SOD (PORK) LOCK FLUSH 100 UNIT/ML IV SOLN
500.0000 [IU] | Freq: Once | INTRAVENOUS | Status: DC | PRN
  Filled 2020-08-21: qty 5

## 2020-08-21 MED ORDER — SODIUM CHLORIDE 0.9% FLUSH
10.0000 mL | Freq: Once | INTRAVENOUS | Status: AC
  Administered 2020-08-21: 10 mL
  Filled 2020-08-21: qty 10

## 2020-08-21 NOTE — Progress Notes (Signed)
Lindsay [name]   Telephone:([phone]) [phone] Fax:(336) [phone]   Clinic Follow up Note   Patient Care Team: [name], MD as PCP - General (Internal Medicine) [name], RN as Oncology Nurse Navigator [name], MD as Consulting Physician (Oncology) [name], MD as Consulting Physician (Gastroenterology) 08/21/2020  CHIEF COMPLAINT: Follow up colon cancer   SUMMARY OF ONCOLOGIC HISTORY: Oncology History Overview Note  Cancer Staging No matching staging information was found for the patient.    metastatic colon cancer to liver  06/20/2019 Procedure   Colonoscopy by Dr [name] 06/20/19  IMPRESSION -Seven 3 to 10 mm polyps in the sigmoid colon, in the transverse colon and in the escending colon, removed with a cold snare. Resected and retrireved.  -One 78m polyp in the descending colon. Biopsies. Tattoes.  -Mediaum sized lipoma in the ascending colon.   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 See Comment B. Colon, Ascending, polyp, Polypectomy:  -TUBULAR ADENOMA -No high grade dysplasia or malignancy.  C. Colon, TRansverse, Polyo, polectomy:  -TUBULAR ADENOMA -No high grade dysplasia or malignancy.  D. Colon, Sigmoid, Polyp, Polypectomy:  -HYPERPLASTIC POLYP   11/07/2019 Imaging   UKoreaAbdomen 11/07/19  IMPRESSION: 1. Two solid masses in the liver are nonspecific. Recommend MRI abdomen with and without contrast for further evaluation.   12/15/2019 Imaging   MRI Abdomen 12/15/19  IMPRESSION: 1. There are two large masses in the liver with appearance favoring metastatic disease or hepatocellular carcinoma or cholangiocarcinoma. A benign etiology is highly unlikely given the enhancement pattern and associated adenopathy. 2. Considerable porta hepatis and retroperitoneal adenopathy. Some of the confluent porta hepatis tumor is potentially infiltrative and abuts the  pancreatic body along its right upper margin, making it difficult to completely exclude the possibility of pancreatic adenocarcinoma primary. Possibilities helpful in further workup might include tissue diagnosis, endoscopic ultrasound, or nuclear medicine PET-CT. 3. Pancreas divisum. 4. Lumbar spondylosis and degenerative disc disease. 5. Despite efforts by the technologist and patient, motion artifact is present on today's exam and could not be eliminated. This reduces exam sensitivity and specificity.   12/22/2019 Procedure   Upper Endoscopy by Dr [name]10/15/21  IMPRESSION - One lymph node was visualized and measured in the porta hepatis region. Fine needle aspiration performed    12/22/2019 Initial Biopsy   A. LIVER, PORTA HEPATIS MASS, FINE NEEDLE 12/22/19 ASPIRATION:  FINAL MICROSCOPIC DIAGNOSIS:  - Malignant cells consistent with metastatic adenocarcinoma   01/01/2020 Initial Diagnosis   Intra[place]atic [place]angiocarcinoma [place]   01/08/2020 Initial Biopsy   FINAL MICROSCOPIC DIAGNOSIS:   A. LIVER, LEFT LOBE, BIOPSY:  - Metastatic adenocarcinoma, consistent with a colorectal primary.  See  comment      COMMENT:   Immunohistochemical stains show the tumor cells are positive for CK20  and CDX2 but negative for CK7, consistent with above interpretation.  Dr. [name] notified on 01/10/2020   01/08/2020 Genetic Testing   [name]  [name] wildtype and [name]/[name] mutations which make her eligible for target biological agent Vectibix.   01/24/2020 Procedure   PAC placed 01/24/20   01/25/2020 -  Chemotherapy   first line FOLFOX starting 01/25/2020, Vextibix  added with C2 (02/06/20)   06/20/2020 Imaging   CT CAP  IMPRESSION: 1. Continued interval reduction in size and conspicuity of a subsolid nodule of the peripheral left upper lobe. 2. Unchanged prominent pretracheal and subcarinal lymph nodes. 3. Redemonstrated partially calcified low-attenuation liver  masses, slightly decreased in size compared to prior examination. 4. Slight interval decrease in size of a portacaval lymph node or conglomerate and retroperitoneal lymph nodes. 5. Findings are consistent with continued treatment response of nodal, pulmonary, and hepatic metastatic disease. 6. Coronary artery disease.   Aortic Atherosclerosis (ICD10-I70.0).       CURRENT THERAPY: FOLFOX and panitumumab.   INTERVAL HISTORY: Lindsay [name] returns for follow up and treatment as scheduled. She was last seen 08/07/20 and completed another cycle of FOLFOX and panitumumab.  She feels well, no changes since last visit.  Appetite and taste fluctuate, energy level is adequate.  Occasional mild nausea is managed with antiemetics.  No vomiting.  Bowels moving normally.  Denies abdominal pain.  Hands remain numb, she is able to function normally without dropping things or difficulties.  Denies fever, chills, cough, chest pain, dyspnea, significant rash, or other new concerns.   MEDICAL HISTORY:  Past Medical History:  Diagnosis Date   Arthritis    Diabetes mellitus without complication ([place])    Hypertension     SURGICAL HISTORY: Past Surgical History:  Procedure Laterality Date   ABDOMINAL HYSTERECTOMY     ANTERIOR AND POSTERIOR REPAIR WITH SACROSPINOUS FIXATION N/A 07/16/2016   Procedure: ANTERIOR AND POSTERIOR REPAIR WITH SACROSPINOUS FIXATION;  Surgeon: Everlene Farrier, MD;  Location: [facility];  Service: Gynecology;  Laterality: N/A;   CYSTOSCOPY  07/16/2016   Procedure: CYSTOSCOPY;  Surgeon: Everlene Farrier, MD;  Location: [facility];  Service: Gynecology;;   ESOPHAGOGASTRODUODENOSCOPY (EGD) WITH PROPOFOL N/A 12/22/2019   Procedure: ESOPHAGOGASTRODUODENOSCOPY (EGD) WITH PROPOFOL;  Surgeon: [name], MD;  Location: [facility];  Service: Endoscopy;  Laterality: N/A;   FINE NEEDLE ASPIRATION N/A 12/22/2019   Procedure: FINE NEEDLE ASPIRATION (FNA) LINEAR;  Surgeon: [name], MD;  Location: [facility];  Service: Endoscopy;  Laterality: N/A;   IR IMAGING GUIDED PORT INSERTION  01/24/2020   LAPAROSCOPIC VAGINAL HYSTERECTOMY WITH SALPINGECTOMY Bilateral 07/16/2016   Procedure: LAPAROSCOPIC ASSISTED VAGINAL HYSTERECTOMY WITH SALPINGECTOMY;  Surgeon: Everlene Farrier, MD;  Location: [facility];  Service: Gynecology;  Laterality: Bilateral;   MYOMECTOMY ABDOMINAL APPROACH     THYROID SURGERY     tyroid     UPPER ESOPHAGEAL ENDOSCOPIC ULTRASOUND (EUS) N/A 12/22/2019   Procedure: UPPER ESOPHAGEAL ENDOSCOPIC ULTRASOUND (EUS);  Surgeon: [name], MD;  Location: [facility];  Service: Endoscopy;  Laterality: N/A;    I have reviewed the social history and family history with the patient and they are unchanged from previous note.  ALLERGIES:  has No Known Allergies.  MEDICATIONS:  Current Outpatient Medications  Medication Sig Dispense Refill   aspirin EC 81 MG tablet Take 1 tablet (81 mg total) by mouth daily. 90 tablet 3   atorvastatin (LIPITOR) 20 MG tablet Take 20 mg by mouth at bedtime.   1   clindamycin (CLINDAGEL) 1 % gel Apply topically 2 (two) times daily. 60 g 2   clobetasol cream (TEMOVATE) 8.78 % Apply 1 application topically daily.     doxycycline (VIBRA-TABS) 100 MG tablet Take 1 tablet (100 mg total) by mouth 2 (two) times daily. 60 tablet 1   esomeprazole (NEXIUM) 40 MG capsule Take 1 capsule (40 mg total) by mouth daily before breakfast. 30 capsule 3   lidocaine-prilocaine (EMLA) cream Apply 1 application topically as needed. 30 g 1   lisinopril-hydrochlorothiazide (ZESTORETIC) 20-12.5 MG tablet Take 1 tablet by mouth daily with breakfast.      magic mouthwash w/lidocaine SOLN Take 5 mLs by  mouth 4 (four) times daily. 475 mL 2   magnesium oxide (MAG-OX) 400 (241.3 Mg) MG tablet Take 1 tablet (400 mg total) by mouth in the morning, at noon, and at bedtime. 90 tablet 2   magnesium oxide (MAG-OX) 400 MG tablet Take 1 tablet by mouth 2 (two) times daily.     ondansetron  (ZOFRAN) 8 MG tablet Take 1 tablet (8 mg total) by mouth every 8 (eight) hours as needed for nausea or vomiting. 20 tablet 1   potassium chloride SA (KLOR-CON) 20 MEQ tablet Take 1 tablet (20 mEq total) by mouth 2 (two) times daily. 60 tablet 1   prochlorperazine (COMPAZINE) 10 MG tablet Take 1 tablet (10 mg total) by mouth every 6 (six) hours as needed for nausea or vomiting. 30 tablet 2   spironolactone (ALDACTONE) 25 MG tablet Take 25 mg by mouth daily with breakfast.     traMADol (ULTRAM) 50 MG tablet Take 1 tablet (50 mg total) by mouth every 6 (six) hours as needed. 15 tablet 0   valACYclovir (VALTREX) 1000 MG tablet Take 1 tablet (1,000 mg total) by mouth 2 (two) times daily. 10 tablet 0   No current facility-administered medications for this visit.    PHYSICAL EXAMINATION: ECOG PERFORMANCE STATUS: 1 - Symptomatic but completely ambulatory  Vitals:   08/21/20 1027  BP: 131/81  Pulse: 69  Resp: 17  Temp: 97.6 F (36.4 C)  SpO2: 100%   Filed Weights   08/21/20 1027  Weight: 178 lb 9.6 oz (81 kg)    GENERAL:alert, no distress and comfortable SKIN: No significant rash EYES: sclera clear LUNGS:  normal breathing effort NEURO: alert & oriented x 3 with fluent speech, no focal motor deficits.  Mildly decreased peripheral vibratory sense over the fingertips per tuning fork exam PAC without erythema  LABORATORY DATA:  I have reviewed the data as listed CBC Latest Ref Rng & Units 08/21/2020 08/07/2020 07/24/2020  WBC 4.0 - 10.5 K/uL 5.3 4.2 4.1  Hemoglobin 12.0 - 15.0 g/dL 11.7(L) 10.9(L) 10.7(L)  Hematocrit 36.0 - 46.0 % 34.9(L) 32.1(L) 31.0(L)  Platelets 150 - 400 K/uL 159 144(L) 141(L)     CMP Latest Ref Rng & Units 08/21/2020 08/07/2020 07/24/2020  Glucose 70 - 99 mg/dL 110(H) 102(H) 111(H)  BUN 8 - 23 mg/dL 19 14 10   Creatinine 0.44 - 1.00 mg/dL 0.77 0.68 0.64  Sodium 135 - 145 mmol/L 136 139 140  Potassium 3.5 - 5.1 mmol/L 4.9 3.3(L) 3.2(L)  Chloride 98 - 111 mmol/L 104  97(L) 100  CO2 22 - 32 mmol/L 21(L) 30 30  Calcium 8.9 - 10.3 mg/dL 9.9 9.3 9.1  Total Protein 6.5 - 8.1 g/dL 8.2(H) 7.5 7.5  Total Bilirubin 0.3 - 1.2 mg/dL 0.4 0.4 0.4  Alkaline Phos 38 - 126 U/L 99 76 83  AST 15 - 41 U/L 27 29 29   ALT 0 - 44 U/L 21 20 17       RADIOGRAPHIC STUDIES: I have personally reviewed the radiological images as listed and agreed with the findings in the report. No results found.   ASSESSMENT & PLAN: Lindsay [name] is a 65 y.o. female with   1. 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 MSS, [name]/[name] wildtype -Her 12/15/19 MRI abdomen showed 2 large liver masses indicating metastatic disease and bulky porta hepatis and retroperitoneal adenopathy. -Her EUS from 12/22/19 showed 3.5cm LN in porta hepatis region and biopsy confirmed metastatic adenocarcinoma.  Preliminary cytology sample is not adequate for  any additional IHC study for the origin of tumor, the morphology does not support HCC. -Her EGD was negative for malignancy.  Her colonoscopy in March 2021 did show a large 2 cm polyps in descending colon, biopsy showed high-grade dysplasia.   -Liver biopsy confirmed adenocarcinoma, immunostain showed positive CDX2 and CK20, negative CK7, supporting primary colorectal cancer -PET scan showed known liver metastasis, and diffuse thoracic and abdominal adenopathy. There is a hypermetabolic 1.8 cm pulmonary nodule in left upper lobe, and focal hypermetabolic lesion in the splenic fixture of the colon, concerning for primary tumor. -She underwent repeat colonoscopy and biopsy by Dr. Benson Norway; additional IHC testing with TTF-1 shows focal staining, which can be seen in 5-10% of colorectal primary cancers as well as lung cancer. However given the above, the overall picture is still consistent with colorectal primary.  -She began first line systemic FOLFOX on 11/18, goal is palliative. Tolerating well  -FO shows [name]/[name] wild-type, she is a candidate for EGFR inhibitor  Panitumumab which was added with C2 -Restaging CT's 04/01/2020 and 06/20/20 showed interval reduction in size of liver, lung, and nodal metastases.  CEA normalized as of 04/16/2020   2. Chronic Lower abdominal pain, Nausea -She has had chronic low abdominal pain for 5 years ranging up to 4 times a week. She notes her pain worsened in the last 3 years, but no work-up until 2021.   -Her pain is 5/10 at most but when high not managed on Motrin.  -she has tramadol but does not take it much -encouraged her to use Ensure/boost if she has decreased appetite  -f/up with dietician -abdominal pain improved on chemo.  mild an intermittent. Does not require medication  -Denies pain today (08/21/20)   3. 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 Remains functional without difficulty. Oxali has been dose-reduced further, stable    4. 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 -She lives with husband and has brothers and sister in and out of town. Her only child passed in recent years.   5. Goals of care -we discussed the incurable but treatable nature of her stage IV cancer and general prognosis. She understands the prognosis is poor if she does not tolerate or respond well to chemo -she understands the goal is palliative -full code    6. Skin and nail changes -secondary to chemo (likely 5FU) -keep hands moisturized, reviewed symptom management   7. Blood tinged sputum -Reported on 07/10/20, has been ongoing but intermittent -on ASA for prevention, no other AC -no clinical signs of sinusitis. No respiratory symptoms -hold aspirin and monitor  -not discussed today 08/07/20 -resolved (08/21/20)  Disposition: Lindsay [name] appears stable.  She continues dose reduced FOLFOX and panitumumab, tolerating well  with mild nausea and stable neuropathy.  Side effects are well managed with supportive care at home.  She is able to recover and function well.  There is no clinical evidence of disease progression.  Labs reviewed.  K up to 4.9, reduce oral potassium to 1 tab daily.  Continue mag 3 times daily, will give 4 g IV today.  CEA is pending.  Labs adequate to proceed with FOLFOX and panitumumab today at same dose.   Follow-up and next cycle in 2 weeks.  All questions were answered. The patient knows to call the clinic  with any problems, questions or concerns. No barriers to learning were detected.     Alla Feeling, NP 08/21/20

## 2020-08-21 NOTE — Progress Notes (Signed)
Patient had an episode of nausea and vomiting that came on quickly and resolved after vomiting and patient stated that she felt better. She then started to itch and stated that she feels this way at home some times related to her eczema. Contacted Dr. Burr Medico and received an order for claritin since patient is driving self home.

## 2020-08-21 NOTE — Patient Instructions (Signed)
[facility]  Discharge Instructions: Thank you for choosing [facility] to provide your oncology and hematology care.   If you have a lab appointment with the [facility], please go directly to the [facility] and check in at the registration area.   Wear comfortable clothing and clothing appropriate for easy access to any Portacath or PICC line.   We strive to give you quality time with your provider. You may need to reschedule your appointment if you arrive late (15 or more minutes).  Arriving late affects you and other patients whose appointments are after yours.  Also, if you miss three or more appointments without notifying the office, you may be dismissed from the clinic at the provider's discretion.      For prescription refill requests, have your pharmacy contact our office and allow 72 hours for refills to be completed.    Today you received the following chemotherapy and/or immunotherapy agents : VEctibix, Oxaliplatin, Leucovorin, 5FU     To help prevent nausea and vomiting after your treatment, we encourage you to take your nausea medication as directed.  BELOW ARE SYMPTOMS THAT SHOULD BE REPORTED IMMEDIATELY: *FEVER GREATER THAN 100.4 F (38 C) OR HIGHER *CHILLS OR SWEATING *NAUSEA AND VOMITING THAT IS NOT CONTROLLED WITH YOUR NAUSEA MEDICATION *UNUSUAL SHORTNESS OF BREATH *UNUSUAL BRUISING OR BLEEDING *URINARY PROBLEMS (pain or burning when urinating, or frequent urination) *BOWEL PROBLEMS (unusual diarrhea, constipation, pain near the anus) TENDERNESS IN MOUTH AND THROAT WITH OR WITHOUT PRESENCE OF ULCERS (sore throat, sores in mouth, or a toothache) UNUSUAL RASH, SWELLING OR PAIN  UNUSUAL VAGINAL DISCHARGE OR ITCHING   Items with * indicate a potential emergency and should be followed up as soon as possible or go to the Emergency Department if any problems should occur.  Please show the CHEMOTHERAPY ALERT CARD or  IMMUNOTHERAPY ALERT CARD at check-in to the Emergency Department and triage nurse.  Should you have questions after your visit or need to cancel or reschedule your appointment, please contact [facility]  Dept: [phone]  and follow the prompts.  Office hours are 8:00 a.m. to 4:30 p.m. Monday - Friday. Please note that voicemails left after 4:00 p.m. may not be returned until the following business day.  We are closed weekends and major holidays. You have access to a nurse at all times for urgent questions. Please call the main number to the clinic Dept: [phone] and follow the prompts.   For any non-urgent questions, you may also contact your provider using MyChart. We now offer e-Visits for anyone 8 and older to request care online for non-urgent symptoms. For details visit [email or web address].   Also download the MyChart app! Go to the app store, search "MyChart", open the app, select [facility], and log in with your MyChart username and password.  Due to Covid, a mask is required upon entering the hospital/clinic. If you do not have a mask, one will be given to you upon arrival. For doctor visits, patients may have 1 support person aged 6 or older with them. For treatment visits, patients cannot have anyone with them due to current Covid guidelines and our immunocompromised population.

## 2020-08-22 ENCOUNTER — Telehealth: Payer: Self-pay | Admitting: Hematology

## 2020-08-22 NOTE — Telephone Encounter (Signed)
Scheduled  appointment per 06/16 sch msg. Patient is aware. 

## 2020-08-22 NOTE — Telephone Encounter (Signed)
Scheduled follow-up appointments per 6/15 los. Patient is aware.

## 2020-08-23 ENCOUNTER — Other Ambulatory Visit: Payer: Self-pay

## 2020-08-23 ENCOUNTER — Inpatient Hospital Stay: Payer: Medicare (Managed Care)

## 2020-08-23 VITALS — BP 128/72 | HR 62 | Temp 98.2°F | Resp 18

## 2020-08-23 DIAGNOSIS — C221 Intrahepatic bile duct carcinoma: Secondary | ICD-10-CM

## 2020-08-23 MED ORDER — HEPARIN SOD (PORK) LOCK FLUSH 100 UNIT/ML IV SOLN
500.0000 [IU] | Freq: Once | INTRAVENOUS | Status: DC | PRN
  Filled 2020-08-23: qty 5

## 2020-08-23 MED ORDER — SODIUM CHLORIDE 0.9% FLUSH
10.0000 mL | INTRAVENOUS | Status: DC | PRN
  Filled 2020-08-23: qty 10

## 2020-09-03 NOTE — Progress Notes (Signed)
[place]   [phone] [phone]   Clinic Follow up Note   Patient Care Team: [name], MD as PCP - General (Internal Medicine) [name], RN as Oncology Nurse Navigator [name], MD as Consulting Physician (Oncology) [name], MD as Consulting Physician (Gastroenterology)  Date of Service:  09/04/2020  CHIEF COMPLAINT: Follow-up metastatic colon cancer  SUMMARY OF ONCOLOGIC HISTORY: Oncology History Overview Note  Cancer Staging No matching staging information was found for the patient.    metastatic colon cancer to liver  06/20/2019 Procedure   Colonoscopy by Dr [name] 06/20/19  IMPRESSION -Seven 3 to 10 mm polyps in the sigmoid colon, in the transverse colon and in the escending colon, removed with a cold snare. Resected and retrireved.  -One 68m polyp in the descending colon. Biopsies. Tattoes.  -Mediaum sized lipoma in the ascending colon.   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 See Comment B. Colon, Ascending, polyp, Polypectomy:  -TUBULAR ADENOMA -No high grade dysplasia or malignancy.  C. Colon, TRansverse, Polyo, polectomy:  -TUBULAR ADENOMA -No high grade dysplasia or malignancy.  D. Colon, Sigmoid, Polyp, Polypectomy:  -HYPERPLASTIC POLYP   11/07/2019 Imaging   [place]oreaAbdomen 11/07/19  IMPRESSION: 1. Two solid masses in the liver are nonspecific. Recommend MRI abdomen with and without contrast for further evaluation.   12/15/2019 Imaging   MRI Abdomen 12/15/19  IMPRESSION: 1. There are two large masses in the liver with appearance favoring metastatic disease or hepatocellular carcinoma or cholangiocarcinoma. A benign etiology is highly unlikely given the enhancement pattern and associated adenopathy. 2. Considerable porta hepatis and retroperitoneal adenopathy. Some of the confluent porta hepatis tumor is  potentially infiltrative and abuts the pancreatic body along its right upper margin, making it difficult to completely exclude the possibility of pancreatic adenocarcinoma primary. Possibilities helpful in further workup might include tissue diagnosis, endoscopic ultrasound, or nuclear medicine PET-CT. 3. Pancreas divisum. 4. Lumbar spondylosis and degenerative disc disease. 5. Despite efforts by the technologist and patient, motion artifact is present on today's exam and could not be eliminated. This reduces exam sensitivity and specificity.   12/22/2019 Procedure   Upper Endoscopy by Dr [name]10/15/21  IMPRESSION - One lymph node was visualized and measured in the porta hepatis region. Fine needle aspiration performed    12/22/2019 Initial Biopsy   A. LIVER, PORTA HEPATIS MASS, FINE NEEDLE 12/22/19 ASPIRATION:  FINAL MICROSCOPIC DIAGNOSIS:  - Malignant cells consistent with metastatic adenocarcinoma   01/01/2020 Initial Diagnosis   Intrahepatic cholangiocarcinoma ([place]   01/08/2020 Initial Biopsy   FINAL MICROSCOPIC DIAGNOSIS:   A. LIVER, LEFT LOBE, BIOPSY:  - Metastatic adenocarcinoma, consistent with a colorectal primary.  See  comment      COMMENT:   Immunohistochemical stains show the tumor cells are positive for CK20  and CDX2 but negative for CK7, consistent with above interpretation.  Dr. [name] notified on 01/10/2020   01/08/2020 Genetic Testing   [name]  [name] wildtype and [name]/[name] mutations which make her eligible for target biological agent Vectibix.   01/24/2020 Procedure   PAC placed 01/24/20   01/25/2020 -  Chemotherapy   first line FOLFOX starting 01/25/2020, Vextibix  added with C2 (02/06/20)   06/20/2020 Imaging   CT CAP  IMPRESSION: 1. Continued interval reduction in size and conspicuity of a subsolid nodule of the peripheral left upper lobe. 2. Unchanged prominent pretracheal and subcarinal lymph nodes. 3. Redemonstrated  partially  calcified low-attenuation liver masses, slightly decreased in size compared to prior examination. 4. Slight interval decrease in size of a portacaval lymph node or conglomerate and retroperitoneal lymph nodes. 5. Findings are consistent with continued treatment response of nodal, pulmonary, and hepatic metastatic disease. 6. Coronary artery disease.   Aortic Atherosclerosis (ICD10-I70.0).        CURRENT THERAPY:  First line FOLFOX starting 01/25/2020, Vextibix added with C2 (02/06/20)  INTERVAL HISTORY: Lindsay [name] is here for a follow up. She was last seen by me 07/24/20 and seen by NP Lacie in [facility]irm. She presents to the clinic alone. She is currently on cycle 17 FOLFOX. The patient notes that she got sick for the first time during the last treatment. It started with nausea and then she started getting itchy, but this did not last long. This was a similar feeling to the eczema she gets at home. She did not see any rash or redness. The patient notes her numbness is still persistent in her feet. This is stable and not worsening. She denies any issues with dropping items or being bothered in her sleep.    REVIEW OF SYSTEMS: Constitutional: Denies fevers, chills or abnormal weight loss Eyes: Denies blurriness of vision Ears, nose, mouth, throat, and face: Denies mucositis or sore throat Respiratory: Denies cough, dyspnea or wheezes Cardiovascular: Denies palpitation, chest discomfort or lower extremity swelling Gastrointestinal:  Denies nausea, heartburn or change in bowel habits Skin: Denies abnormal skin rashes Lymphatics: Denies new lymphadenopathy or easy bruising Neurological:Denies numbness, tingling or new weaknesses Behavioral/Psych: Mood is stable, no new changes  All other systems were reviewed with the patient and are negative.  MEDICAL HISTORY:  Past Medical History:  Diagnosis Date   Arthritis    Diabetes mellitus without complication ([place])    Hypertension      SURGICAL HISTORY: Past Surgical History:  Procedure Laterality Date   ABDOMINAL HYSTERECTOMY     ANTERIOR AND POSTERIOR REPAIR WITH SACROSPINOUS FIXATION N/A 07/16/2016   Procedure: ANTERIOR AND POSTERIOR REPAIR WITH SACROSPINOUS FIXATION;  Surgeon: Everlene Farrier, MD;  Location: [facility];  Service: Gynecology;  Laterality: N/A;   CYSTOSCOPY  07/16/2016   Procedure: CYSTOSCOPY;  Surgeon: Everlene Farrier, MD;  Location: [facility];  Service: Gynecology;;   ESOPHAGOGASTRODUODENOSCOPY (EGD) WITH PROPOFOL N/A 12/22/2019   Procedure: ESOPHAGOGASTRODUODENOSCOPY (EGD) WITH PROPOFOL;  Surgeon: [name], MD;  Location: [facility];  Service: Endoscopy;  Laterality: N/A;   FINE NEEDLE ASPIRATION N/A 12/22/2019   Procedure: FINE NEEDLE ASPIRATION (FNA) LINEAR;  Surgeon: [name], MD;  Location: [facility];  Service: Endoscopy;  Laterality: N/A;   IR IMAGING GUIDED PORT INSERTION  01/24/2020   LAPAROSCOPIC VAGINAL HYSTERECTOMY WITH SALPINGECTOMY Bilateral 07/16/2016   Procedure: LAPAROSCOPIC ASSISTED VAGINAL HYSTERECTOMY WITH SALPINGECTOMY;  Surgeon: Everlene Farrier, MD;  Location: [facility];  Service: Gynecology;  Laterality: Bilateral;   MYOMECTOMY ABDOMINAL APPROACH     THYROID SURGERY     tyroid     UPPER ESOPHAGEAL ENDOSCOPIC ULTRASOUND (EUS) N/A 12/22/2019   Procedure: UPPER ESOPHAGEAL ENDOSCOPIC ULTRASOUND (EUS);  Surgeon: [name], MD;  Location: [facility];  Service: Endoscopy;  Laterality: N/A;    I have reviewed the social history and family history with the patient and they are unchanged from previous note.  ALLERGIES:  has No Known Allergies.  MEDICATIONS:  Current Outpatient Medications  Medication Sig Dispense Refill   aspirin EC 81 MG tablet Take 1 tablet (81 mg total) by mouth daily. 90 tablet 3  atorvastatin (LIPITOR) 20 MG tablet Take 20 mg by mouth at bedtime.   1   clindamycin (CLINDAGEL) 1 % gel Apply topically 2 (two) times daily. 60 g 2   clobetasol cream  (TEMOVATE) 2.69 % Apply 1 application topically daily.     doxycycline (VIBRA-TABS) 100 MG tablet Take 1 tablet (100 mg total) by mouth 2 (two) times daily. 60 tablet 1   esomeprazole (NEXIUM) 40 MG capsule Take 1 capsule (40 mg total) by mouth daily before breakfast. 30 capsule 3   lidocaine-prilocaine (EMLA) cream Apply 1 application topically as needed. 30 g 1   lisinopril-hydrochlorothiazide (ZESTORETIC) 20-12.5 MG tablet Take 1 tablet by mouth daily with breakfast.      magic mouthwash w/lidocaine SOLN Take 5 mLs by mouth 4 (four) times daily. 475 mL 2   magnesium oxide (MAG-OX) 400 (241.3 Mg) MG tablet Take 1 tablet (400 mg total) by mouth in the morning, at noon, and at bedtime. 90 tablet 2   magnesium oxide (MAG-OX) 400 MG tablet Take 1 tablet by mouth 2 (two) times daily.     ondansetron (ZOFRAN) 8 MG tablet Take 1 tablet (8 mg total) by mouth every 8 (eight) hours as needed for nausea or vomiting. 20 tablet 1   potassium chloride SA (KLOR-CON) 20 MEQ tablet Take 1 tablet (20 mEq total) by mouth 2 (two) times daily. 60 tablet 1   prochlorperazine (COMPAZINE) 10 MG tablet Take 1 tablet (10 mg total) by mouth every 6 (six) hours as needed for nausea or vomiting. 30 tablet 2   spironolactone (ALDACTONE) 25 MG tablet Take 25 mg by mouth daily with breakfast.     traMADol (ULTRAM) 50 MG tablet Take 1 tablet (50 mg total) by mouth every 6 (six) hours as needed. 15 tablet 0   valACYclovir (VALTREX) 1000 MG tablet Take 1 tablet (1,000 mg total) by mouth 2 (two) times daily. 10 tablet 0   No current facility-administered medications for this visit.   Facility-Administered Medications Ordered in Other Visits  Medication Dose Route Frequency Provider Last Rate Last Admin   fluorouracil (ADRUCIL) 3,550 mg in sodium chloride 0.9 % 79 mL chemo infusion  1,800 mg/m2 (Treatment Plan Recorded) Intravenous 1 day or 1 dose [name], MD       leucovorin 792 mg in dextrose 5 % 250 mL infusion  400 mg/m2  (Treatment Plan Recorded) Intravenous Once [name], MD 145 mL/hr at 09/04/20 1538 792 mg at 09/04/20 1538   oxaliplatin (ELOXATIN) 80 mg in dextrose 5 % 500 mL chemo infusion  40 mg/m2 (Treatment Plan Recorded) Intravenous Once [name], MD 258 mL/hr at 09/04/20 1535 80 mg at 09/04/20 1535   prochlorperazine (COMPAZINE) tablet 10 mg  10 mg Oral Q6H PRN [name], MD   10 mg at 09/04/20 1607   sodium chloride flush (NS) 0.9 % injection 10 mL  10 mL Intracatheter PRN [name], MD        PHYSICAL EXAMINATION: ECOG PERFORMANCE STATUS: 1 - Symptomatic but completely ambulatory  Vitals:   09/04/20 1401  BP: (!) 143/94  Pulse: 75  Resp: 17  Temp: 97.7 F (36.5 C)  SpO2: 95%   Filed Weights   09/04/20 1401  Weight: 177 lb 3.2 oz (80.4 kg)    GENERAL:alert, no distress and comfortable SKIN: skin color, texture, turgor are normal, no rashes or significant lesions EYES: normal, Conjunctiva are pink and non-injected, sclera clear  NEURO: alert & oriented x 3 with fluent speech,  no focal motor/sensory deficits  LABORATORY DATA:  I have reviewed the data as listed CBC Latest Ref Rng & Units 09/04/2020 08/21/2020 08/07/2020  WBC 4.0 - 10.5 K/uL 4.5 5.3 4.2  Hemoglobin 12.0 - 15.0 g/dL 11.7(L) 11.7(L) 10.9(L)  Hematocrit 36.0 - 46.0 % 33.3(L) 34.9(L) 32.1(L)  Platelets 150 - 400 K/uL 178 159 144(L)     CMP Latest Ref Rng & Units 09/04/2020 08/21/2020 08/07/2020  Glucose 70 - 99 mg/dL 110(H) 110(H) 102(H)  BUN 8 - 23 mg/dL 11 19 14   Creatinine 0.44 - 1.00 mg/dL 0.73 0.77 0.68  Sodium 135 - 145 mmol/L 139 136 139  Potassium 3.5 - 5.1 mmol/L 4.3 4.9 3.3(L)  Chloride 98 - 111 mmol/L 105 104 97(L)  CO2 22 - 32 mmol/L 23 21(L) 30  Calcium 8.9 - 10.3 mg/dL 8.7(L) 9.9 9.3  Total Protein 6.5 - 8.1 g/dL 8.1 8.2(H) 7.5  Total Bilirubin 0.3 - 1.2 mg/dL 0.3 0.4 0.4  Alkaline Phos 38 - 126 U/L 83 99 76  AST 15 - 41 U/L 29 27 29   ALT 0 - 44 U/L 25 21 20       RADIOGRAPHIC STUDIES: I have  personally reviewed the radiological images as listed and agreed with the findings in the report. No results found.   ASSESSMENT & PLAN:  Lindsay [name] is a 65 y.o. female with   1. 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 -Her EUS from 12/22/19 showed 3.5cm LN in porta hepatis region and biopsy confirmed metastatic adenocarcinoma. Her liver biopsy results confirmed adenocarcinoma, immunostain showed positive CDX2 and CK20, negative CK7, supporting primary colorectal cancer. -Initial PET from 01/12/20 showed known liver metastasis, and diffuse adenopathy in thoracic and abdomen. There is a hypermetabolic 1.8 cm pulmonary nodule in left upper lobe, and focal hypermetabolic lesion in the splenic fixture of the colon, concerning for primary tumor. -She underwent repeat colonoscopy and biopsy by Dr. Benson Norway in 01/2020. The overall picture is still consistent with colorectal primary.  -Although her cancer is not curable at this stage, it is still treatable. I started her on first-line FOLFOX q2weeks on 01/25/20. Vectibix was added with C2.  -Given good response on 06/20/20 CT CAP and concern for neuropathy, will change chemo to maintenance Xeloda and Vectibix soon. -Neuropathy and symptoms very stable. Will add Claritin to premeds today. Overall adequate to proceed with C16 FOLFOX and Vectibix today with oxaliplatin dose reduction. We discussed that I will stop her Oxaliplaitn in near future, probably after next scan.  -Will get CT scan in July.    2. Symptom Management: Acne skin rash, Skin toxicity, Constipation, Acid Reflux  -S/p C4 she has increased skin toxicity from 5FU. Dose reduced starting with C5. I encouraged her to keep her skin moisturized and clean. I also suggested cotton gloves.  -Her acne skin rash form Vectibix has  been isolate to her face and mild-moderate. She can continue clindamycin and  Hydrocortisone.  -She also has dry skin leading to cracking. I discussed keeping hands, clean and moisturized. She may use gloves as well. -For mouth soreness, she continues mouth rinse daily. If not enough I will call in Magic mouth wash -For constipation, she can manage with Senakot-S (given miralax and ducolax has not helped). She can use Milk of Magnesium half bottle at a time for unresolved constipation.  3. Mild Neuropathy G1 -S/p C5 she has intermittent mild neuropathy with numbness in her fingertips only. -Oxaliplatin dose reduced from C6. Will continue to monitor.  -S/p C15-16 she has stable numbness in her feet. Will monitor closely and will likely stop Oxaliplatin soon.    4. Chronic Lower abdominal pain, Nausea, secondary to #1 -She has had chronic low abdominal pain for 5 years ranging up to 4 times a week. She notes her pain worsened in the last 3 years, but no work-up until 2021.   -For pain she is now on Tramadol for severe pain. She can otherwise she Tylenol -For her intermittent nausea she has Zofran -I discussed to maintain weight she can continue Ensure with high protein/high calorie diet and remain active. Continue to f/u with dietician. Weight is stable.     5. HTN, DM, Heart Murmur, Arthritis -On medications. Managed by her PCP and Cardiologist Dr Einar Gip   6. 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 -She lives with husband and has brothers and sister in and out of town. Her only child passed in recent years. -She notes her husband is aware of her condition and treatment, but she keeps the information to a minimum as she feels he may not handle it well.    7. Goals of care -she understands the goal is palliative  -She is full code.    8. 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   -mag 0.8 toady, she will get 4g mag iv today and 4g on day 3 -will increase oral mag to 2 tabs tid    PLAN:  -Labs reviewed with patient. CBC stable with no concerns. Proceed with C17 FOLFOX. Oxiplatin dosage stay at 40 mg/m^2. -Will get CT scan in 4 weeks a few days prior to MD visit. -lab, flush, f/u and chemo in 2 weeks     No problem-specific Assessment & Plan notes found for this encounter.   Orders Placed This Encounter  Procedures   CT CHEST ABDOMEN PELVIS W CONTRAST    Standing Status:   Future    Standing Expiration Date:   09/04/2021    Order Specific Question:   If indicated for the ordered procedure, I authorize the administration of contrast media per Radiology protocol    Answer:   Yes    Order Specific Question:   Preferred imaging location?    Answer:   [facility]    Order Specific Question:   Release to patient    Answer:   Immediate    Order Specific Question:   Is Oral Contrast requested for this exam?    Answer:   Yes, Per Radiology protocol    Order Specific Question:   Reason for Exam (SYMPTOM  OR DIAGNOSIS REQUIRED)    Answer:   evaluate response to chemo    All questions were answered. The patient knows to call the clinic with any problems, questions or concerns. No barriers to learning was detected. The total time spent in the appointment was 30 minutes.     [name], MD 09/04/2020   I, Reinaldo Raddle, am acting as scribe for Dr. [name], MD.

## 2020-09-04 ENCOUNTER — Other Ambulatory Visit: Payer: Self-pay

## 2020-09-04 ENCOUNTER — Inpatient Hospital Stay: Payer: Medicare (Managed Care) | Admitting: Hematology

## 2020-09-04 ENCOUNTER — Inpatient Hospital Stay: Payer: Medicare (Managed Care)

## 2020-09-04 ENCOUNTER — Encounter: Payer: Self-pay | Admitting: Hematology

## 2020-09-04 ENCOUNTER — Telehealth: Payer: Self-pay

## 2020-09-04 VITALS — BP 135/90 | HR 71 | Temp 98.4°F | Resp 18 | Wt 177.0 lb

## 2020-09-04 VITALS — BP 143/94 | HR 75 | Temp 97.7°F | Resp 17 | Ht 66.0 in | Wt 177.2 lb

## 2020-09-04 DIAGNOSIS — Z7189 Other specified counseling: Secondary | ICD-10-CM

## 2020-09-04 DIAGNOSIS — C221 Intrahepatic bile duct carcinoma: Secondary | ICD-10-CM | POA: Diagnosis not present

## 2020-09-04 DIAGNOSIS — Z95828 Presence of other vascular implants and grafts: Secondary | ICD-10-CM

## 2020-09-04 DIAGNOSIS — Z5112 Encounter for antineoplastic immunotherapy: Secondary | ICD-10-CM | POA: Diagnosis not present

## 2020-09-04 LAB — CMP (CANCER CENTER ONLY)
ALT: 25 U/L (ref 0–44)
AST: 29 U/L (ref 15–41)
Albumin: 3.9 g/dL (ref 3.5–5.0)
Alkaline Phosphatase: 83 U/L (ref 38–126)
Anion gap: 11 (ref 5–15)
BUN: 11 mg/dL (ref 8–23)
CO2: 23 mmol/L (ref 22–32)
Calcium: 8.7 mg/dL — ABNORMAL LOW (ref 8.9–10.3)
Chloride: 105 mmol/L (ref 98–111)
Creatinine: 0.73 mg/dL (ref 0.44–1.00)
GFR, Estimated: >60 mL/min (ref >60)
Glucose, Bld: 110 mg/dL — ABNORMAL HIGH (ref 70–99)
Potassium: 4.3 mmol/L (ref 3.5–5.1)
Sodium: 139 mmol/L (ref 135–145)
Total Bilirubin: 0.3 mg/dL (ref 0.3–1.2)
Total Protein: 8.1 g/dL (ref 6.5–8.1)

## 2020-09-04 LAB — CBC WITH DIFFERENTIAL (CANCER CENTER ONLY)
Abs Immature Granulocytes: 0.02 10*3/uL (ref 0.00–0.07)
Basophils Absolute: 0 10*3/uL (ref 0.0–0.1)
Basophils Relative: 0 %
Eosinophils Absolute: 0.1 10*3/uL (ref 0.0–0.5)
Eosinophils Relative: 2 %
HCT: 33.3 % — ABNORMAL LOW (ref 36.0–46.0)
Hemoglobin: 11.7 g/dL — ABNORMAL LOW (ref 12.0–15.0)
Immature Granulocytes: 0 %
Lymphocytes Relative: 24 %
Lymphs Abs: 1.1 10*3/uL (ref 0.7–4.0)
MCH: 32.3 pg (ref 26.0–34.0)
MCHC: 35.1 g/dL (ref 30.0–36.0)
MCV: 92 fL (ref 80.0–100.0)
Monocytes Absolute: 0.6 10*3/uL (ref 0.1–1.0)
Monocytes Relative: 13 %
Neutro Abs: 2.7 10*3/uL (ref 1.7–7.7)
Neutrophils Relative %: 61 %
Platelet Count: 178 10*3/uL (ref 150–400)
RBC: 3.62 MIL/uL — ABNORMAL LOW (ref 3.87–5.11)
RDW: 13.6 % (ref 11.5–15.5)
WBC Count: 4.5 10*3/uL (ref 4.0–10.5)
nRBC: 0 % (ref 0.0–0.2)

## 2020-09-04 LAB — MAGNESIUM: Magnesium: 0.8 mg/dL — CL (ref 1.7–2.4)

## 2020-09-04 MED ORDER — LEUCOVORIN CALCIUM INJECTION 350 MG
400.0000 mg/m2 | Freq: Once | INTRAVENOUS | Status: AC
  Administered 2020-09-04: 792 mg via INTRAVENOUS
  Filled 2020-09-04: qty 39.6

## 2020-09-04 MED ORDER — SODIUM CHLORIDE 0.9 % IV SOLN
Freq: Once | INTRAVENOUS | Status: AC
  Filled 2020-09-04: qty 250

## 2020-09-04 MED ORDER — MAGNESIUM SULFATE 4 GM/100ML IV SOLN
4.0000 g | Freq: Once | INTRAVENOUS | Status: AC
  Administered 2020-09-04: 4 g via INTRAVENOUS
  Filled 2020-09-04: qty 100

## 2020-09-04 MED ORDER — SODIUM CHLORIDE 0.9 % IV SOLN
6.0000 mg/kg | Freq: Once | INTRAVENOUS | Status: AC
  Administered 2020-09-04: 500 mg via INTRAVENOUS
  Filled 2020-09-04: qty 20

## 2020-09-04 MED ORDER — DEXTROSE 5 % IV SOLN
Freq: Once | INTRAVENOUS | Status: AC
  Filled 2020-09-04: qty 250

## 2020-09-04 MED ORDER — SODIUM CHLORIDE 0.9 % IV SOLN
1800.0000 mg/m2 | INTRAVENOUS | Status: DC
  Administered 2020-09-04: 3550 mg via INTRAVENOUS
  Filled 2020-09-04: qty 71

## 2020-09-04 MED ORDER — OXALIPLATIN CHEMO INJECTION 100 MG/20ML
40.0000 mg/m2 | Freq: Once | INTRAVENOUS | Status: AC
  Administered 2020-09-04: 80 mg via INTRAVENOUS
  Filled 2020-09-04: qty 16

## 2020-09-04 MED ORDER — LORATADINE 10 MG PO TABS
ORAL_TABLET | ORAL | Status: AC
  Filled 2020-09-04: qty 1

## 2020-09-04 MED ORDER — LORATADINE 10 MG PO TABS
10.0000 mg | ORAL_TABLET | Freq: Once | ORAL | Status: AC
  Administered 2020-09-04: 10 mg via ORAL

## 2020-09-04 MED ORDER — PALONOSETRON HCL INJECTION 0.25 MG/5ML
INTRAVENOUS | Status: AC
  Filled 2020-09-04: qty 5

## 2020-09-04 MED ORDER — PROCHLORPERAZINE MALEATE 10 MG PO TABS
10.0000 mg | ORAL_TABLET | Freq: Four times a day (QID) | ORAL | Status: DC | PRN
  Administered 2020-09-04: 10 mg via ORAL

## 2020-09-04 MED ORDER — PALONOSETRON HCL INJECTION 0.25 MG/5ML
0.2500 mg | Freq: Once | INTRAVENOUS | Status: AC
  Administered 2020-09-04: 0.25 mg via INTRAVENOUS

## 2020-09-04 MED ORDER — SODIUM CHLORIDE 0.9 % IV SOLN
10.0000 mg | Freq: Once | INTRAVENOUS | Status: AC
  Administered 2020-09-04: 10 mg via INTRAVENOUS
  Filled 2020-09-04: qty 10

## 2020-09-04 MED ORDER — PROCHLORPERAZINE MALEATE 10 MG PO TABS
ORAL_TABLET | ORAL | Status: AC
  Filled 2020-09-04: qty 1

## 2020-09-04 MED ORDER — SODIUM CHLORIDE 0.9% FLUSH
10.0000 mL | INTRAVENOUS | Status: DC | PRN
  Administered 2020-09-04: 10 mL
  Filled 2020-09-04: qty 10

## 2020-09-04 MED ORDER — SODIUM CHLORIDE 0.9% FLUSH
10.0000 mL | Freq: Once | INTRAVENOUS | Status: AC
Start: 2020-09-04 — End: 2020-09-04
  Administered 2020-09-04: 10 mL
  Filled 2020-09-04: qty 10

## 2020-09-04 NOTE — Progress Notes (Signed)
Pt very became very nauseated, vomiting & retching soon after oxaliplatin & leucovorin started.  Dr. Burr Medico informed, compazine 10 mg po ordered.  30 minutes after compazine given, patient feels much better, held medication down.  No vomiting @ this time, nausea relieved.

## 2020-09-04 NOTE — Patient Instructions (Signed)
[place] [facility]  Discharge Instructions: Thank you for choosing [facility] to provide your oncology and hematology care.   If you have a lab appointment with the [facility], please go directly to the [facility] and check in at the registration area.   Wear comfortable clothing and clothing appropriate for easy access to any Portacath or PICC line.   We strive to give you quality time with your provider. You may need to reschedule your appointment if you arrive late (15 or more minutes).  Arriving late affects you and other patients whose appointments are after yours.  Also, if you miss three or more appointments without notifying the office, you may be dismissed from the clinic at the provider's discretion.      For prescription refill requests, have your pharmacy contact our office and allow 72 hours for refills to be completed.    Today you received the following chemotherapy and/or immunotherapy agents Vectibix, Oxaliplatin, Leucovorin, 5FU      To help prevent nausea and vomiting after your treatment, we encourage you to take your nausea medication as directed.  BELOW ARE SYMPTOMS THAT SHOULD BE REPORTED IMMEDIATELY: *FEVER GREATER THAN 100.4 F (38 C) OR HIGHER *CHILLS OR SWEATING *NAUSEA AND VOMITING THAT IS NOT CONTROLLED WITH YOUR NAUSEA MEDICATION *UNUSUAL SHORTNESS OF BREATH *UNUSUAL BRUISING OR BLEEDING *URINARY PROBLEMS (pain or burning when urinating, or frequent urination) *BOWEL PROBLEMS (unusual diarrhea, constipation, pain near the anus) TENDERNESS IN MOUTH AND THROAT WITH OR WITHOUT PRESENCE OF ULCERS (sore throat, sores in mouth, or a toothache) UNUSUAL RASH, SWELLING OR PAIN  UNUSUAL VAGINAL DISCHARGE OR ITCHING   Items with * indicate a potential emergency and should be followed up as soon as possible or go to the Emergency Department if any problems should occur.  Please show the CHEMOTHERAPY ALERT CARD or  IMMUNOTHERAPY ALERT CARD at check-in to the Emergency Department and triage nurse.  Should you have questions after your visit or need to cancel or reschedule your appointment, please contact [facility]  Dept: [phone]  and follow the prompts.  Office hours are 8:00 a.m. to 4:30 p.m. Monday - Friday. Please note that voicemails left after 4:00 p.m. may not be returned until the following business day.  We are closed weekends and major holidays. You have access to a nurse at all times for urgent questions. Please call the main number to the clinic Dept: [phone] and follow the prompts.   For any non-urgent questions, you may also contact your provider using MyChart. We now offer e-Visits for anyone 72 and older to request care online for non-urgent symptoms. For details visit [email or web address].   Also download the MyChart app! Go to the app store, search "MyChart", open the app, select [facility], and log in with your MyChart username and password.  Due to Covid, a mask is required upon entering the hospital/clinic. If you do not have a mask, one will be given to you upon arrival. For doctor visits, patients may have 1 support person aged 23 or older with them. For treatment visits, patients cannot have anyone with them due to current Covid guidelines and our immunocompromised population.   Panitumumab Solution for Injection What is this medication? PANITUMUMAB (pan i TOOM ue mab) is a monoclonal antibody. It is used to treatcolorectal cancer. This medicine may be used for other purposes; ask your health care provider orpharmacist if you have questions. COMMON BRAND NAME(S): Vectibix  What should I tell my care team before I take this medication? They need to know if you have any of these conditions: eye disease, vision problems low levels of calcium, magnesium, or potassium in the blood lung or breathing disease, like asthma skin conditions or  sensitivity an unusual or allergic reaction to panitumumab, other medicines, foods, dyes, or preservatives pregnant or trying to get pregnant breast-feeding How should I use this medication? This drug is given as an infusion into a vein. It is administered in a hospitalor clinic by a specially trained health care professional. Talk to your pediatrician regarding the use of this medicine in children.Special care may be needed. Overdosage: If you think you have taken too much of this medicine contact apoison control center or emergency room at once. NOTE: This medicine is only for you. Do not share this medicine with others. What if I miss a dose? It is important not to miss your dose. Call your doctor or health careprofessional if you are unable to keep an appointment. What may interact with this medication? Do not take this medicine with any of the following medications: bevacizumab This list may not describe all possible interactions. Give your health care provider a list of all the medicines, herbs, non-prescription drugs, or dietary supplements you use. Also tell them if you smoke, drink alcohol, or use illegaldrugs. Some items may interact with your medicine. What should I watch for while using this medication? Visit your doctor for checks on your progress. This drug may make you feel generally unwell. This is not uncommon, as chemotherapy can affect healthy cells as well as cancer cells. Report any side effects. Continue your course oftreatment even though you feel ill unless your doctor tells you to stop. This medicine can make you more sensitive to the sun. Keep out of the sun while receiving this medicine and for 2 months after the last dose. If you cannot avoid being in the sun, wear protective clothing and use sunscreen. Do not usesun lamps or tanning beds/booths. In some cases, you may be given additional medicines to help with side effects.Follow all directions for their use. Call your  doctor or health care professional for advice if you get a fever, chills or sore throat, or other symptoms of a cold or flu. Do not treat yourself. This drug decreases your body's ability to fight infections. Try toavoid being around people who are sick. Avoid taking products that contain aspirin, acetaminophen, ibuprofen, naproxen, or ketoprofen unless instructed by your doctor. These medicines may hide afever. Do not become pregnant while taking this medicine and for 2 months after the last dose. Women should inform their doctor if they wish to become pregnant or think they might be pregnant. There is a potential for serious side effects to an unborn child. Talk to your health care professional or pharmacist for more information. Do not breast-feed an infant while taking this medicine or for 75months after the last dose. What side effects may I notice from receiving this medication? Side effects that you should report to your doctor or health care professionalas soon as possible: allergic reactions like skin rash, itching or hives, swelling of the face, lips, or tongue breathing problems changes in vision eye pain fast, irregular heartbeat fever, chills mouth sores red spots on the skin redness, blistering, peeling or loosening of the skin, including inside the mouth signs and symptoms of kidney injury like trouble passing urine or change in the amount of urine signs and symptoms  of low blood pressure like dizziness; feeling faint or lightheaded, falls; unusually weak or tired signs of low calcium like fast heartbeat, muscle cramps or muscle pain; pain, tingling, numbness in the hands or feet; seizures signs and symptoms of low magnesium like muscle cramps, pain, or weakness; tremors; seizures; or fast, irregular heartbeat signs and symptoms of low potassium like muscle cramps or muscle pain; chest pain; dizziness; feeling faint or lightheaded, falls; palpitations; breathing problems; or fast,  irregular heartbeat swelling of the ankles, feet, hands Side effects that usually do not require medical attention (report to yourdoctor or health care professional if they continue or are bothersome): changes in skin like acne, cracks, skin dryness diarrhea eyelash growth headache mouth sores nail changes nausea, vomiting This list may not describe all possible side effects. Call your doctor for medical advice about side effects. You may report side effects to FDA at1-800-FDA-1088. Where should I keep my medication? This drug is given in a hospital or clinic and will not be stored at home. NOTE: This sheet is a summary. It may not cover all possible information. If you have questions about this medicine, talk to your doctor, pharmacist, orhealth care provider.  2022 Elsevier/Gold Standard (2015-09-13 16:45:04)  Oxaliplatin Injection What is this medication? OXALIPLATIN (ox AL i PLA tin) is a chemotherapy drug. It targets fast dividing cells, like cancer cells, and causes these cells to die. This medicine is usedto treat cancers of the colon and rectum, and many other cancers. This medicine may be used for other purposes; ask your health care provider orpharmacist if you have questions. COMMON BRAND NAME(S): Eloxatin What should I tell my care team before I take this medication? They need to know if you have any of these conditions: heart disease history of irregular heartbeat liver disease low blood counts, like white cells, platelets, or red blood cells lung or breathing disease, like asthma take medicines that treat or prevent blood clots tingling of the fingers or toes, or other nerve disorder an unusual or allergic reaction to oxaliplatin, other chemotherapy, other medicines, foods, dyes, or preservatives pregnant or trying to get pregnant breast-feeding How should I use this medication? This drug is given as an infusion into a vein. It is administered in a hospitalor clinic  by a specially trained health care professional. Talk to your pediatrician regarding the use of this medicine in children.Special care may be needed. Overdosage: If you think you have taken too much of this medicine contact apoison control center or emergency room at once. NOTE: This medicine is only for you. Do not share this medicine with others. What if I miss a dose? It is important not to miss a dose. Call your doctor or health careprofessional if you are unable to keep an appointment. What may interact with this medication? Do not take this medicine with any of the following medications: cisapride dronedarone pimozide thioridazine This medicine may also interact with the following medications: aspirin and aspirin-like medicines certain medicines that treat or prevent blood clots like warfarin, apixaban, dabigatran, and rivaroxaban cisplatin cyclosporine diuretics medicines for infection like acyclovir, adefovir, amphotericin B, bacitracin, cidofovir, foscarnet, ganciclovir, gentamicin, pentamidine, vancomycin NSAIDs, medicines for pain and inflammation, like ibuprofen or naproxen other medicines that prolong the QT interval (an abnormal heart rhythm) pamidronate zoledronic acid This list may not describe all possible interactions. Give your health care provider a list of all the medicines, herbs, non-prescription drugs, or dietary supplements you use. Also tell them if you smoke, drink  alcohol, or use illegaldrugs. Some items may interact with your medicine. What should I watch for while using this medication? Your condition will be monitored carefully while you are receiving thismedicine. You may need blood work done while you are taking this medicine. This medicine may make you feel generally unwell. This is not uncommon as chemotherapy can affect healthy cells as well as cancer cells. Report any side effects. Continue your course of treatment even though you feel ill unless  yourhealthcare professional tells you to stop. This medicine can make you more sensitive to cold. Do not drink cold drinks or use ice. Cover exposed skin before coming in contact with cold temperatures or cold objects. When out in cold weather wear warm clothing and cover your mouth and nose to warm the air that goes into your lungs. Tell your doctor if you getsensitive to the cold. Do not become pregnant while taking this medicine or for 9 months after stopping it. Women should inform their health care professional if they wish to become pregnant or think they might be pregnant. Men should not father a child while taking this medicine and for 6 months after stopping it. There is potential for serious side effects to an unborn child. Talk to your health careprofessional for more information. Do not breast-feed a child while taking this medicine or for 3 months afterstopping it. This medicine has caused ovarian failure in some women. This medicine may make it more difficult to get pregnant. Talk to your health care professional if [place] concerned about your fertility. This medicine has caused decreased sperm counts in some men. This may make it more difficult to father a child. Talk to your health care professional if [place] concerned about your fertility. This medicine may increase your risk of getting an infection. Call your health care professional for advice if you get a fever, chills, or sore throat, or other symptoms of a cold or flu. Do not treat yourself. Try to avoid beingaround people who are sick. Avoid taking medicines that contain aspirin, acetaminophen, ibuprofen, naproxen, or ketoprofen unless instructed by your health care professional.These medicines may hide a fever. Be careful brushing or flossing your teeth or using a toothpick because you may get an infection or bleed more easily. If you have any dental work done, Primary school teacher you are receiving this medicine. What side effects may I  notice from receiving this medication? Side effects that you should report to your doctor or health care professionalas soon as possible: allergic reactions like skin rash, itching or hives, swelling of the face, lips, or tongue breathing problems cough low blood counts - this medicine may decrease the number of white blood cells, red blood cells, and platelets. You may be at increased risk for infections and bleeding nausea, vomiting pain, redness, or irritation at site where injected pain, tingling, numbness in the hands or feet signs and symptoms of bleeding such as bloody or black, tarry stools; red or dark brown urine; spitting up blood or brown material that looks like coffee grounds; red spots on the skin; unusual bruising or bleeding from the eyes, gums, or nose signs and symptoms of a dangerous change in heartbeat or heart rhythm like chest pain; dizziness; fast, irregular heartbeat; palpitations; feeling faint or lightheaded; falls signs and symptoms of infection like fever; chills; cough; sore throat; pain or trouble passing urine signs and symptoms of liver injury like dark yellow or brown urine; general ill feeling or flu-like symptoms; light-colored stools; loss of  appetite; nausea; right upper belly pain; unusually weak or tired; yellowing of the eyes or skin signs and symptoms of low red blood cells or anemia such as unusually weak or tired; feeling faint or lightheaded; falls signs and symptoms of muscle injury like dark urine; trouble passing urine or change in the amount of urine; unusually weak or tired; muscle pain; back pain Side effects that usually do not require medical attention (report to yourdoctor or health care professional if they continue or are bothersome): changes in taste diarrhea gas hair loss loss of appetite mouth sores This list may not describe all possible side effects. Call your doctor for medical advice about side effects. You may report side effects  to FDA at1-800-FDA-1088. Where should I keep my medication? This drug is given in a hospital or clinic and will not be stored at home. NOTE: This sheet is a summary. It may not cover all possible information. If you have questions about this medicine, talk to your doctor, pharmacist, orhealth care provider.  2022 Elsevier/Gold Standard (2018-07-13 12:20:35)  Leucovorin injection What is this medication? LEUCOVORIN (loo koe VOR in) is used to prevent or treat the harmful effects of some medicines. This medicine is used to treat anemia caused by a low amount of folic acid in the body. It is also used with 5-fluorouracil (5-FU) to treatcolon cancer. This medicine may be used for other purposes; ask your health care provider orpharmacist if you have questions. What should I tell my care team before I take this medication? They need to know if you have any of these conditions: anemia from low levels of vitamin B-12 in the blood an unusual or allergic reaction to leucovorin, folic acid, other medicines, foods, dyes, or preservatives pregnant or trying to get pregnant breast-feeding How should I use this medication? This medicine is for injection into a muscle or into a vein. It is given by ahealth care professional in a hospital or clinic setting. Talk to your pediatrician regarding the use of this medicine in children.Special care may be needed. Overdosage: If you think you have taken too much of this medicine contact apoison control center or emergency room at once. NOTE: This medicine is only for you. Do not share this medicine with others. What if I miss a dose? This does not apply. What may interact with this medication? capecitabine fluorouracil phenobarbital phenytoin primidone trimethoprim-sulfamethoxazole This list may not describe all possible interactions. Give your health care provider a list of all the medicines, herbs, non-prescription drugs, or dietary supplements you use.  Also tell them if you smoke, drink alcohol, or use illegaldrugs. Some items may interact with your medicine. What should I watch for while using this medication? Your condition will be monitored carefully while you are receiving thismedicine. This medicine may increase the side effects of 5-fluorouracil, 5-FU. Tell your doctor or health care professional if you have diarrhea or mouth sores that donot get better or that get worse. What side effects may I notice from receiving this medication? Side effects that you should report to your doctor or health care professionalas soon as possible: allergic reactions like skin rash, itching or hives, swelling of the face, lips, or tongue breathing problems fever, infection mouth sores unusual bleeding or bruising unusually weak or tired Side effects that usually do not require medical attention (report to yourdoctor or health care professional if they continue or are bothersome): constipation or diarrhea loss of appetite nausea, vomiting This list may not describe all possible  side effects. Call your doctor for medical advice about side effects. You may report side effects to FDA at1-800-FDA-1088. Where should I keep my medication? This drug is given in a hospital or clinic and will not be stored at home. NOTE: This sheet is a summary. It may not cover all possible information. If you have questions about this medicine, talk to your doctor, pharmacist, orhealth care provider.  2022 Elsevier/Gold Standard (2007-08-30 16:50:29)  Fluorouracil, 5-FU injection What is this medication? FLUOROURACIL, 5-FU (flure oh YOOR a sil) is a chemotherapy drug. It slows the growth of cancer cells. This medicine is used to treat many types of cancer like breast cancer, colon or rectal cancer, pancreatic cancer, and stomachcancer. This medicine may be used for other purposes; ask your health care provider orpharmacist if you have questions. COMMON BRAND NAME(S):  Adrucil What should I tell my care team before I take this medication? They need to know if you have any of these conditions: blood disorders dihydropyrimidine dehydrogenase (DPD) deficiency infection (especially a virus infection such as chickenpox, cold sores, or herpes) kidney disease liver disease malnourished, poor nutrition recent or ongoing radiation therapy an unusual or allergic reaction to fluorouracil, other chemotherapy, other medicines, foods, dyes, or preservatives pregnant or trying to get pregnant breast-feeding How should I use this medication? This drug is given as an infusion or injection into a vein. It is administeredin a hospital or clinic by a specially trained health care professional. Talk to your pediatrician regarding the use of this medicine in children.Special care may be needed. Overdosage: If you think you have taken too much of this medicine contact apoison control center or emergency room at once. NOTE: This medicine is only for you. Do not share this medicine with others. What if I miss a dose? It is important not to miss your dose. Call your doctor or health careprofessional if you are unable to keep an appointment. What may interact with this medication? Do not take this medicine with any of the following medications: live virus vaccines This medicine may also interact with the following medications: medicines that treat or prevent blood clots like warfarin, enoxaparin, and dalteparin This list may not describe all possible interactions. Give your health care provider a list of all the medicines, herbs, non-prescription drugs, or dietary supplements you use. Also tell them if you smoke, drink alcohol, or use illegaldrugs. Some items may interact with your medicine. What should I watch for while using this medication? Visit your doctor for checks on your progress. This drug may make you feel generally unwell. This is not uncommon, as chemotherapy can  affect healthy cells as well as cancer cells. Report any side effects. Continue your course oftreatment even though you feel ill unless your doctor tells you to stop. In some cases, you may be given additional medicines to help with side effects.Follow all directions for their use. Call your doctor or health care professional for advice if you get a fever, chills or sore throat, or other symptoms of a cold or flu. Do not treat yourself. This drug decreases your body's ability to fight infections. Try toavoid being around people who are sick. This medicine may increase your risk to bruise or bleed. Call your doctor orhealth care professional if you notice any unusual bleeding. Be careful brushing and flossing your teeth or using a toothpick because you may get an infection or bleed more easily. If you have any dental work done,tell your dentist you are receiving this  medicine. Avoid taking products that contain aspirin, acetaminophen, ibuprofen, naproxen, or ketoprofen unless instructed by your doctor. These medicines may hide afever. Do not become pregnant while taking this medicine. Women should inform their doctor if they wish to become pregnant or think they might be pregnant. There is a potential for serious side effects to an unborn child. Talk to your health care professional or pharmacist for more information. Do not breast-feed aninfant while taking this medicine. Men should inform their doctor if they wish to father a child. This medicinemay lower sperm counts. Do not treat diarrhea with over the counter products. Contact your doctor ifyou have diarrhea that lasts more than 2 days or if it is severe and watery. This medicine can make you more sensitive to the sun. Keep out of the sun. If you cannot avoid being in the sun, wear protective clothing and use sunscreen.Do not use sun lamps or tanning beds/booths. What side effects may I notice from receiving this medication? Side effects that you  should report to your doctor or health care professionalas soon as possible: allergic reactions like skin rash, itching or hives, swelling of the face, lips, or tongue low blood counts - this medicine may decrease the number of white blood cells, red blood cells and platelets. You may be at increased risk for infections and bleeding. signs of infection - fever or chills, cough, sore throat, pain or difficulty passing urine signs of decreased platelets or bleeding - bruising, pinpoint red spots on the skin, black, tarry stools, blood in the urine signs of decreased red blood cells - unusually weak or tired, fainting spells, lightheadedness breathing problems changes in vision chest pain mouth sores nausea and vomiting pain, swelling, redness at site where injected pain, tingling, numbness in the hands or feet redness, swelling, or sores on hands or feet stomach pain unusual bleeding Side effects that usually do not require medical attention (report to yourdoctor or health care professional if they continue or are bothersome): changes in finger or toe nails diarrhea dry or itchy skin hair loss headache loss of appetite sensitivity of eyes to the light stomach upset unusually teary eyes This list may not describe all possible side effects. Call your doctor for medical advice about side effects. You may report side effects to FDA at1-800-FDA-1088. Where should I keep my medication? This drug is given in a hospital or clinic and will not be stored at home. NOTE: This sheet is a summary. It may not cover all possible information. If you have questions about this medicine, talk to your doctor, pharmacist, orhealth care provider.  2022 Elsevier/Gold Standard (2019-01-24 15:00:03)  Hypomagnesemia Hypomagnesemia is a condition in which the level of magnesium in the blood is low. Magnesium is a mineral that is found in many foods. It is used in many different processes in the body.  Hypomagnesemia can affect every organ in thebody. In severe cases, it can cause life-threatening problems. What are the causes? This condition may be caused by: Not getting enough magnesium in your diet. Malnutrition. Problems with absorbing magnesium from the intestines. Dehydration. Alcohol abuse. Vomiting. Severe or chronic diarrhea. Some medicines, including medicines that make you urinate more (diuretics). Certain diseases, such as kidney disease, diabetes, celiac disease, and overactive thyroid. What are the signs or symptoms? Symptoms of this condition include: Loss of appetite. Nausea and vomiting. Involuntary shaking or trembling of a body part (tremor). Muscle weakness. Tingling in the arms and legs. Sudden tightening of muscles (muscle spasms). Confusion.  Psychiatric issues, such as depression, irritability, or psychosis. A feeling of fluttering of the heart. Seizures. These symptoms are more severe if magnesium levels drop suddenly. How is this diagnosed? This condition may be diagnosed based on: Your symptoms and medical history. A physical exam. Blood and urine tests. How is this treated? Treatment depends on the cause and the severity of the condition. It may be treated with: A magnesium supplement. This can be taken in pill form. If the condition is severe, magnesium is usually given through an IV. Changes to your diet. You may be directed to eat foods that have a lot of magnesium, such as green leafy vegetables, peas, beans, and nuts. Stopping any intake of alcohol. Follow these instructions at home:     Make sure that your diet includes foods with magnesium. Foods that have a lot of magnesium in them include: Green leafy vegetables, such as spinach and broccoli. Beans and peas. Nuts and seeds, such as almonds and sunflower seeds. Whole grains, such as whole grain bread and fortified cereals. Take magnesium supplements if your health care provider tells  you to do that. Take them as directed. Take over-the-counter and prescription medicines only as told by your health care provider. Have your magnesium levels monitored as told by your health care provider. When you are active, drink fluids that contain electrolytes. Avoid drinking alcohol. Keep all follow-up visits as told by your health care provider. This is important. Contact a health care provider if: You get worse instead of better. Your symptoms return. Get help right away if you: Develop severe muscle weakness. Have trouble breathing. Feel that your heart is racing. Summary Hypomagnesemia is a condition in which the level of magnesium in the blood is low. Hypomagnesemia can affect every organ in the body. Treatment may include eating more foods that contain magnesium, taking magnesium supplements, and not drinking alcohol. Have your magnesium levels monitored as told by your health care provider. This information is not intended to replace advice given to you by your health care provider. Make sure you discuss any questions you have with your healthcare provider. Document Revised: 07/27/2019 Document Reviewed: 07/27/2019 Elsevier Patient Education  [employer].

## 2020-09-04 NOTE — Telephone Encounter (Signed)
CRITICAL VALUE STICKER  CRITICAL VALUE: magnesium 0.8  RECEIVER (on-site recipient of call): Vickii Penna, RN  DATE & TIME NOTIFIED: 09/04/20 @ 1203  MESSENGER (representative from lab): Suanne Marker  MD NOTIFIED: Ihor Gully, RN to Dr Burr Medico  TIME OF NOTIFICATION: 1204  RESPONSE: will relay to provider

## 2020-09-05 ENCOUNTER — Telehealth: Payer: Self-pay | Admitting: Hematology

## 2020-09-05 NOTE — Telephone Encounter (Signed)
Left message with follow-up appointments per 6/29 los. 

## 2020-09-06 ENCOUNTER — Other Ambulatory Visit: Payer: Self-pay

## 2020-09-06 ENCOUNTER — Inpatient Hospital Stay: Payer: Medicare (Managed Care) | Attending: Hematology

## 2020-09-06 VITALS — BP 150/91 | HR 71 | Temp 99.0°F | Resp 17

## 2020-09-06 DIAGNOSIS — Z5112 Encounter for antineoplastic immunotherapy: Secondary | ICD-10-CM | POA: Insufficient documentation

## 2020-09-06 DIAGNOSIS — Z5111 Encounter for antineoplastic chemotherapy: Secondary | ICD-10-CM | POA: Diagnosis present

## 2020-09-06 DIAGNOSIS — R011 Cardiac murmur, unspecified: Secondary | ICD-10-CM | POA: Diagnosis not present

## 2020-09-06 DIAGNOSIS — M1990 Unspecified osteoarthritis, unspecified site: Secondary | ICD-10-CM | POA: Insufficient documentation

## 2020-09-06 DIAGNOSIS — C19 Malignant neoplasm of rectosigmoid junction: Secondary | ICD-10-CM | POA: Insufficient documentation

## 2020-09-06 DIAGNOSIS — C778 Secondary and unspecified malignant neoplasm of lymph nodes of multiple regions: Secondary | ICD-10-CM | POA: Insufficient documentation

## 2020-09-06 DIAGNOSIS — T451X5D Adverse effect of antineoplastic and immunosuppressive drugs, subsequent encounter: Secondary | ICD-10-CM | POA: Insufficient documentation

## 2020-09-06 DIAGNOSIS — I10 Essential (primary) hypertension: Secondary | ICD-10-CM | POA: Diagnosis not present

## 2020-09-06 DIAGNOSIS — E119 Type 2 diabetes mellitus without complications: Secondary | ICD-10-CM | POA: Diagnosis not present

## 2020-09-06 DIAGNOSIS — Z95828 Presence of other vascular implants and grafts: Secondary | ICD-10-CM

## 2020-09-06 DIAGNOSIS — Z87891 Personal history of nicotine dependence: Secondary | ICD-10-CM | POA: Diagnosis not present

## 2020-09-06 DIAGNOSIS — Z86010 Personal history of colonic polyps: Secondary | ICD-10-CM | POA: Diagnosis not present

## 2020-09-06 DIAGNOSIS — Z7984 Long term (current) use of oral hypoglycemic drugs: Secondary | ICD-10-CM | POA: Insufficient documentation

## 2020-09-06 DIAGNOSIS — C787 Secondary malignant neoplasm of liver and intrahepatic bile duct: Secondary | ICD-10-CM | POA: Diagnosis present

## 2020-09-06 DIAGNOSIS — C221 Intrahepatic bile duct carcinoma: Secondary | ICD-10-CM

## 2020-09-06 MED ORDER — HEPARIN SOD (PORK) LOCK FLUSH 100 UNIT/ML IV SOLN
500.0000 [IU] | Freq: Once | INTRAVENOUS | Status: AC | PRN
  Administered 2020-09-06: 500 [IU]
  Filled 2020-09-06: qty 5

## 2020-09-06 MED ORDER — SODIUM CHLORIDE 0.9% FLUSH
10.0000 mL | Freq: Once | INTRAVENOUS | Status: AC | PRN
  Administered 2020-09-06: 10 mL
  Filled 2020-09-06: qty 10

## 2020-09-06 MED ORDER — MAGNESIUM SULFATE 4 GM/100ML IV SOLN
4.0000 g | Freq: Once | INTRAVENOUS | Status: AC
  Administered 2020-09-06: 4 g via INTRAVENOUS
  Filled 2020-09-06: qty 100

## 2020-09-06 MED ORDER — SODIUM CHLORIDE 0.9 % IV SOLN
Freq: Once | INTRAVENOUS | Status: AC
  Filled 2020-09-06: qty 250

## 2020-09-06 MED ORDER — MAGNESIUM OXIDE 400 MG PO TABS: 2.0000 | ORAL_TABLET | Freq: Three times a day (TID) | ORAL | 2 refills | Status: DC

## 2020-09-06 NOTE — Patient Instructions (Signed)
Magnesium Sulfate Injection What is this medication? MAGNESIUM SULFATE (mag NEE zee um SUL fate) prevents and treats low levels of magnesium in your body. It may also be used to prevent and treat seizures during pregnancy in people with high blood pressure disorders, such as preeclampsia or eclampsia. Magnesium plays an important role in maintaining the health of your muscles and nervous system. This medicine may be used for other purposes; ask your health care provider or pharmacist if you have questions. What should I tell my care team before I take this medication? They need to know if you have any of these conditions: Heart disease History of irregular heart beat Kidney disease An unusual or allergic reaction to magnesium sulfate, medications, foods, dyes, or preservatives Pregnant or trying to get pregnant Breast-feeding How should I use this medication? This medication is for infusion into a vein. It is given in a hospital or clinic setting. Talk to your care team about the use of this medication in children. While this medication may be prescribed for selected conditions, precautions do apply. Overdosage: If you think you have taken too much of this medicine contact a poison control center or emergency room at once. NOTE: This medicine is only for you. Do not share this medicine with others. What if I miss a dose? This does not apply. What may interact with this medication? Certain medications for anxiety or sleep Certain medications for seizures like phenobarbital Digoxin Medications that relax muscles for surgery Narcotic medications for pain This list may not describe all possible interactions. Give your health care provider a list of all the medicines, herbs, non-prescription drugs, or dietary supplements you use. Also tell them if you smoke, drink alcohol, or use illegal drugs. Some items may interact with your medicine. What should I watch for while using this medication? Your  condition will be monitored carefully while you are receiving this medication. You may need blood work done while you are receiving this medication. What side effects may I notice from receiving this medication? Side effects that you should report to your care team as soon as possible: Allergic reactions-skin rash, itching, hives, swelling of the face, lips, tongue, or throat High magnesium level-confusion, drowsiness, facial flushing, redness, sweating, muscle weakness, fast or irregular heartbeat, trouble breathing Low blood pressure-dizziness, feeling faint or lightheaded, blurry vision Side effects that usually do not require medical attention (report to your care team if they continue or are bothersome): Headache Nausea This list may not describe all possible side effects. Call your doctor for medical advice about side effects. You may report side effects to FDA at 1-800-FDA-1088. Where should I keep my medication? This medication is given in a hospital or clinic and will not be stored at home. NOTE: This sheet is a summary. It may not cover all possible information. If you have questions about this medicine, talk to your doctor, pharmacist, or health care provider.  2022 Elsevier/Gold Standard (2020-04-15 13:10:26)  

## 2020-09-15 NOTE — Progress Notes (Addendum)
[facility]   [phone] [phone]   Clinic Follow up Note   Patient Care Team: [name], MD as PCP - General (Internal Medicine) [name], RN (Inactive) as Oncology Nurse Navigator [name], MD as Consulting Physician (Oncology) [name], MD as Consulting Physician (Gastroenterology) 09/18/2020  CHIEF COMPLAINT: Follow up metastatic colon cancer   SUMMARY OF ONCOLOGIC HISTORY: Oncology History Overview Note  Cancer Staging No matching staging information was found for the patient.    metastatic colon cancer to liver  06/20/2019 Procedure   Colonoscopy by Dr [name] 06/20/19  IMPRESSION -Seven 3 to 10 mm polyps in the sigmoid colon, in the transverse colon and in the escending colon, removed with a cold snare. Resected and retrireved.  -One 65m polyp in the descending colon. Biopsies. Tattoes.  -Mediaum sized lipoma in the ascending colon.   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 See Comment B. Colon, Ascending, polyp, Polypectomy:  -TUBULAR ADENOMA -No high grade dysplasia or malignancy.  C. Colon, TRansverse, Polyo, polectomy:  -TUBULAR ADENOMA -No high grade dysplasia or malignancy.  D. Colon, Sigmoid, Polyp, Polypectomy:  -HYPERPLASTIC POLYP   11/07/2019 Imaging   UKoreaAbdomen 11/07/19  IMPRESSION: 1. Two solid masses in the liver are nonspecific. Recommend MRI abdomen with and without contrast for further evaluation.   12/15/2019 Imaging   MRI Abdomen 12/15/19  IMPRESSION: 1. There are two large masses in the liver with appearance favoring metastatic disease or hepatocellular carcinoma or cholangiocarcinoma. A benign etiology is highly unlikely given the enhancement pattern and associated adenopathy. 2. Considerable porta hepatis and retroperitoneal adenopathy. Some of the confluent porta hepatis tumor is potentially  infiltrative and abuts the pancreatic body along its right upper margin, making it difficult to completely exclude the possibility of pancreatic adenocarcinoma primary. Possibilities helpful in further workup might include tissue diagnosis, endoscopic ultrasound, or nuclear medicine PET-CT. 3. Pancreas divisum. 4. Lumbar spondylosis and degenerative disc disease. 5. Despite efforts by the technologist and patient, motion artifact is present on today's exam and could not be eliminated. This reduces exam sensitivity and specificity.   12/22/2019 Procedure   Upper Endoscopy by Dr [name]10/15/21  IMPRESSION - One lymph node was visualized and measured in the porta hepatis region. Fine needle aspiration performed    12/22/2019 Initial Biopsy   A. LIVER, PORTA HEPATIS MASS, FINE NEEDLE 12/22/19 ASPIRATION:  FINAL MICROSCOPIC DIAGNOSIS:  - Malignant cells consistent with metastatic adenocarcinoma   01/01/2020 Initial Diagnosis   [place]   01/08/2020 Initial Biopsy   FINAL MICROSCOPIC DIAGNOSIS:   A. LIVER, LEFT LOBE, BIOPSY:  - Metastatic adenocarcinoma, consistent with a colorectal primary.  See  comment      COMMENT:   Immunohistochemical stains show the tumor cells are positive for CK20  and CDX2 but negative for CK7, consistent with above interpretation.  Dr. [name] notified on 01/10/2020   01/08/2020 Genetic Testing   [name]  [name] wildtype and [name]/[name] mutations which make her eligible for target biological agent Vectibix.   01/24/2020 Procedure   PAC placed 01/24/20   01/25/2020 -  Chemotherapy   first line FOLFOX starting 01/25/2020, Vextibix  added with C2 (02/06/20)   06/20/2020 Imaging   CT CAP  IMPRESSION: 1. Continued interval reduction in size and conspicuity of a subsolid nodule of the peripheral left upper lobe. 2. Unchanged prominent pretracheal and subcarinal lymph nodes. 3. Redemonstrated partially calcified  low-attenuation liver masses, slightly decreased in size compared to prior examination. 4. Slight interval decrease in size of a portacaval lymph node or conglomerate and retroperitoneal lymph nodes. 5. Findings are consistent with continued treatment response of nodal, pulmonary, and hepatic metastatic disease. 6. Coronary artery disease.   Aortic Atherosclerosis (ICD10-I70.0).       CURRENT THERAPY: First line FOLFOX starting 01/25/2020, vectibix added with C2 (02/06/20)  INTERVAL HISTORY: Lindsay [name] returns for follow up and treatment as scheduled. She was last seen 09/04/20 and completed another cycle of FOLFOX/vectibix.  She vomited twice during infusion which is unusual for her, did not have issues with nausea/vomiting after that.  She is doing well.  Energy and appetite are normal.  She had a sore in her mouth last week but did not limit p.o. intake.  Bowels moving normally.  She has occasional abdominal pain that does not require medication, denies new/worsening pain.  She has persistent numbness in her feet and hands, not painful.  She is able to manage with some difficulty buttoning close.  Cold sensitivity is persistent but not very bothersome.  Does not take Doxy, skin rash is not that bad.  She takes mag 2 tabs 3 times a day potassium once daily.  Denies fever, chills, cough, chest pain, dyspnea.   MEDICAL HISTORY:  Past Medical History:  Diagnosis Date   Arthritis    Diabetes mellitus without complication ([place])    Hypertension     SURGICAL HISTORY: Past Surgical History:  Procedure Laterality Date   ABDOMINAL HYSTERECTOMY     ANTERIOR AND POSTERIOR REPAIR WITH SACROSPINOUS FIXATION N/A 07/16/2016   Procedure: ANTERIOR AND POSTERIOR REPAIR WITH SACROSPINOUS FIXATION;  Surgeon: Everlene Farrier, MD;  Location: [facility];  Service: Gynecology;  Laterality: N/A;   CYSTOSCOPY  07/16/2016   Procedure: CYSTOSCOPY;  Surgeon: Everlene Farrier, MD;  Location: [facility];  Service: Gynecology;;    ESOPHAGOGASTRODUODENOSCOPY (EGD) WITH PROPOFOL N/A 12/22/2019   Procedure: ESOPHAGOGASTRODUODENOSCOPY (EGD) WITH PROPOFOL;  Surgeon: [name], MD;  Location: [facility];  Service: Endoscopy;  Laterality: N/A;   FINE NEEDLE ASPIRATION N/A 12/22/2019   Procedure: FINE NEEDLE ASPIRATION (FNA) LINEAR;  Surgeon: [name], MD;  Location: [facility];  Service: Endoscopy;  Laterality: N/A;   IR IMAGING GUIDED PORT INSERTION  01/24/2020   LAPAROSCOPIC VAGINAL HYSTERECTOMY WITH SALPINGECTOMY Bilateral 07/16/2016   Procedure: LAPAROSCOPIC ASSISTED VAGINAL HYSTERECTOMY WITH SALPINGECTOMY;  Surgeon: Everlene Farrier, MD;  Location: [facility];  Service: Gynecology;  Laterality: Bilateral;   MYOMECTOMY ABDOMINAL APPROACH     THYROID SURGERY     tyroid     UPPER ESOPHAGEAL ENDOSCOPIC ULTRASOUND (EUS) N/A 12/22/2019   Procedure: UPPER ESOPHAGEAL ENDOSCOPIC ULTRASOUND (EUS);  Surgeon: [name], MD;  Location: [facility];  Service: Endoscopy;  Laterality: N/A;    I have reviewed the social history and family history with the patient and they are unchanged from previous note.  ALLERGIES:  has No Known Allergies.  MEDICATIONS:  Current Outpatient Medications  Medication Sig Dispense Refill   aspirin EC 81 MG tablet Take 1 tablet (81 mg total) by mouth daily. 90 tablet 3   atorvastatin (LIPITOR) 20 MG tablet Take 20 mg by mouth at bedtime.   1   clindamycin (CLINDAGEL) 1 % gel Apply topically 2 (two) times daily. 60 g 2   clobetasol cream (TEMOVATE) 2.35 % Apply 1 application topically daily.     doxycycline (VIBRA-TABS) 100 MG tablet Take 1 tablet (100 mg total) by mouth 2 (two)  times daily. 60 tablet 1   esomeprazole (NEXIUM) 40 MG capsule Take 1 capsule (40 mg total) by mouth daily before breakfast. 30 capsule 3   lidocaine-prilocaine (EMLA) cream Apply 1 application topically as needed. 30 g 1   lisinopril-hydrochlorothiazide (ZESTORETIC) 20-12.5 MG tablet Take 1 tablet by mouth daily  with breakfast.      magic mouthwash w/lidocaine SOLN Take 5 mLs by mouth 4 (four) times daily. 475 mL 2   magnesium oxide (MAG-OX) 400 (241.3 Mg) MG tablet Take 1 tablet (400 mg total) by mouth in the morning, at noon, and at bedtime. 90 tablet 2   magnesium oxide (MAG-OX) 400 MG tablet Take 2 tablets (800 mg total) by mouth 3 (three) times daily. 180 tablet 2   ondansetron (ZOFRAN) 8 MG tablet Take 1 tablet (8 mg total) by mouth every 8 (eight) hours as needed for nausea or vomiting. 20 tablet 1   potassium chloride SA (KLOR-CON) 20 MEQ tablet Take 1 tablet (20 mEq total) by mouth 2 (two) times daily. 60 tablet 1   prochlorperazine (COMPAZINE) 10 MG tablet Take 1 tablet (10 mg total) by mouth every 6 (six) hours as needed for nausea or vomiting. 30 tablet 2   spironolactone (ALDACTONE) 25 MG tablet Take 25 mg by mouth daily with breakfast.     traMADol (ULTRAM) 50 MG tablet Take 1 tablet (50 mg total) by mouth every 6 (six) hours as needed. 15 tablet 0   valACYclovir (VALTREX) 1000 MG tablet Take 1 tablet (1,000 mg total) by mouth 2 (two) times daily. 10 tablet 0   No current facility-administered medications for this visit.   Facility-Administered Medications Ordered in Other Visits  Medication Dose Route Frequency Provider Last Rate Last Admin   0.9 %  sodium chloride infusion   Intravenous Once [name], MD       dextrose 5 % solution   Intravenous Once [name], MD       fluorouracil (ADRUCIL) 3,550 mg in sodium chloride 0.9 % 79 mL chemo infusion  1,800 mg/m2 (Treatment Plan Recorded) Intravenous 1 day or 1 dose [name], MD       heparin lock flush 100 unit/mL  500 Units Intracatheter Once PRN [name], MD       leucovorin 792 mg in dextrose 5 % 250 mL infusion  400 mg/m2 (Treatment Plan Recorded) Intravenous Once [name], MD       loratadine (CLARITIN) tablet 10 mg  10 mg Oral Daily [name], MD   10 mg at 09/18/20 1034   oxaliplatin (ELOXATIN) 80 mg in dextrose 5 % 500 mL chemo  infusion  40 mg/m2 (Treatment Plan Recorded) Intravenous Once [name], MD       panitumumab (VECTIBIX) 500 mg in sodium chloride 0.9 % 100 mL chemo infusion  6 mg/kg (Treatment Plan Recorded) Intravenous Once [name], MD 250 mL/hr at 09/18/20 1339 500 mg at 09/18/20 1339   sodium chloride flush (NS) 0.9 % injection 10 mL  10 mL Intracatheter PRN [name], MD        PHYSICAL EXAMINATION: ECOG PERFORMANCE STATUS: 1 - Symptomatic but completely ambulatory  Vitals:   09/18/20 0957  BP: (!) 159/84  Pulse: 70  Resp: 18  SpO2: 100%   Filed Weights   09/18/20 0957  Weight: 181 lb (82.1 kg)    GENERAL:alert, no distress and comfortable SKIN: No significant rash.  Palms with hyperpigmentation. EYES: sclera clear OROPHARYNX: Discolored tongue, no thrush or ulcers  LUNGS:  normal breathing effort HEART: no lower extremity edema NEURO: alert & oriented x 3 with fluent speech, no focal motor deficits.  Mild to moderately decreased vibratory sense over the fingertips per tuning fork exam PAC without erythema  LABORATORY DATA:  I have reviewed the data as listed CBC Latest Ref Rng & Units 09/18/2020 09/04/2020 08/21/2020  WBC 4.0 - 10.5 K/uL 4.1 4.5 5.3  Hemoglobin 12.0 - 15.0 g/dL 11.8(L) 11.7(L) 11.7(L)  Hematocrit 36.0 - 46.0 % 34.9(L) 33.3(L) 34.9(L)  Platelets 150 - 400 K/uL 164 178 159     CMP Latest Ref Rng & Units 09/18/2020 09/04/2020 08/21/2020  Glucose 70 - 99 mg/dL 102(H) 110(H) 110(H)  BUN 8 - 23 mg/dL _0 Creatinine 0.44 - 1.00 mg/dL 0.67 0.73 0.77  Sodium 135 - 145 mmol/L 137 139 136  Potassium 3.5 - 5.1 mmol/L 4.9 4.3 4.9  Chloride 98 - 111 mmol/L 105 105 104  CO2 22 - 32 mmol/L 25 23 21(L)  Calcium 8.9 - 10.3 mg/dL 9.7 8.7(L) 9.9  Total Protein 6.5 - 8.1 g/dL 8.0 8.1 8.2(H)  Total Bilirubin 0.3 - 1.2 mg/dL 0.3 0.3 0.4  Alkaline Phos 38 - 126 U/L 91 83 99  AST 15 - 41 U/L _1 ALT 0 - 44 U/L _2 RADIOGRAPHIC STUDIES: I have personally  reviewed the radiological images as listed and agreed with the findings in the report. No results found.   ASSESSMENT & PLAN: Lindsay [name] is a 65 y.o. female with   1. 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 MSS, [name]/[name] wildtype -Her 12/15/19 MRI abdomen showed 2 large liver masses indicating metastatic disease and bulky porta hepatis and retroperitoneal adenopathy. -Her EUS from 12/22/19 showed 3.5cm LN in porta hepatis region and biopsy confirmed metastatic adenocarcinoma.  Preliminary cytology sample is not adequate for any additional IHC study for the origin of tumor, the morphology does not support HCC. -Her EGD was negative for malignancy.  Her colonoscopy in March 2021 did show a large 2 cm polyps in descending colon, biopsy showed high-grade dysplasia.   -Liver biopsy confirmed adenocarcinoma, immunostain showed positive CDX2 and CK20, negative CK7, supporting primary colorectal cancer -PET scan showed known liver metastasis, and diffuse thoracic and abdominal adenopathy. There is a hypermetabolic 1.8 cm pulmonary nodule in left upper lobe, and focal hypermetabolic lesion in the splenic fixture of the colon, concerning for primary tumor. -She underwent repeat colonoscopy and biopsy by Dr. Benson Norway; additional IHC testing with TTF-1 shows focal staining, which can be seen in 5-10% of colorectal primary cancers as well as lung cancer. However given the above, the overall picture is still consistent with colorectal primary.  -She began first line systemic FOLFOX on 11/18, goal is palliative. Tolerating well  -FO shows [name]/[name] wild-type, she is a candidate for EGFR inhibitor Panitumumab which was added with C2 -Restaging CT's 04/01/2020 and 06/20/20 showed interval reduction in size of liver, lung, and nodal metastases.  CEA normalized as of 04/16/2020   2. Chronic Lower abdominal pain, Nausea -She has had chronic low abdominal pain for 5 years ranging up to 4 times a week. She notes  her pain worsened in the last 3 years, but no work-up until 2021.   -Her pain is 5/10 at most but when high not managed on Motrin.  -she has tramadol but does not take it much -encouraged her to use Ensure/boost if she has decreased  appetite  -f/up with dietician -abdominal pain improved on chemo.  mild an intermittent. Does not require medication    3. 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 Remains functional with some difficulty in fine motor functions -Oxali has been dose-reduced further, stable. May d/c in next couple cycles    4. 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 -She lives with husband and has brothers and sister in and out of town. Her only child passed in recent years.   5. Goals of care -we discussed the incurable but treatable nature of her stage IV cancer and general prognosis. She understands the prognosis is poor if she does not tolerate or respond well to chemo -she understands the goal is palliative -full code    6. Skin and nail changes -secondary to chemo (likely 5FU) -keep hands moisturized, reviewed symptom management  Disposition:  Ms. Mottram appears stable. She continues dose reduced FOLFOX and panitumumab, tolerating well with mild nausea, mucositis, and stable neuropathy. She has some functional difficulties and mildly decreased vibratory sense in the fingertips. I recommend start B complex vitamin.  She understands if neuropathy progresses we will further reduce and eventually stop oxaliplatin.  Side effects are well managed with supportive care at home, she is able to recover and function well.  There is no clinical evidence of disease progression.  Labs reviewed, mag 1.1.  She will proceed with FOLFOX and panitumumab today as planned, at same dose.   She will receive 4 g IV mag today and on day 3 pump DC.  Continue oral mag 2 tabs 3 times daily and potassium once daily.  She will return for follow-up and next cycle in 2 weeks, restaging CT is scheduled for 10/09/2020.  All questions were answered. The patient knows to call the clinic with any problems, questions or concerns. No barriers to learning were detected.     Lindsay Feeling, NP 09/18/20    Addendum: I was called to infusion room shortly after Oxaliplatin was started, patient developed nausea and diaphoresis. I found Lindsay [name] to be awake and responsive but flat and little but out of it. She was on vitals machine with pulse noted to be 49, hypotensive, and pulse low 90's. Oxygen was applied. Rapid response was called and aggressive support was started, including NS bolus, benadryl, pepcid, and solumedrol. RRT arrived. Pulse remained erratic on the monitor but BP improved. She became more alert and oriented. Sclera injected. EKG was obtained and patient taken to ED via wheelchair in stable condition by nursing staff and rapid response nurse where she was observed for a few hours and ultimately discharged home. I called her later in the evening, she feels she has returned to baseline. The plan is for her to return to cancer center 7/14 for 5FU pump connect.   Lindsay Rue, NP  09/18/2020

## 2020-09-18 ENCOUNTER — Other Ambulatory Visit: Payer: Self-pay

## 2020-09-18 ENCOUNTER — Inpatient Hospital Stay: Payer: Medicare (Managed Care)

## 2020-09-18 ENCOUNTER — Inpatient Hospital Stay (HOSPITAL_BASED_OUTPATIENT_CLINIC_OR_DEPARTMENT_OTHER): Payer: Medicare (Managed Care) | Admitting: Nurse Practitioner

## 2020-09-18 ENCOUNTER — Emergency Department (HOSPITAL_COMMUNITY)
Admission: EM | Admit: 2020-09-18 | Discharge: 2020-09-18 | Disposition: A | Discharge status: Home / Self Care | Payer: Medicare (Managed Care) | Attending: Emergency Medicine | Admitting: Emergency Medicine

## 2020-09-18 ENCOUNTER — Encounter: Payer: Self-pay | Admitting: Nurse Practitioner

## 2020-09-18 ENCOUNTER — Other Ambulatory Visit: Payer: Medicare (Managed Care)

## 2020-09-18 ENCOUNTER — Encounter (HOSPITAL_COMMUNITY): Payer: Self-pay

## 2020-09-18 ENCOUNTER — Ambulatory Visit: Payer: Medicare (Managed Care) | Admitting: Nurse Practitioner

## 2020-09-18 ENCOUNTER — Ambulatory Visit: Payer: Medicare (Managed Care)

## 2020-09-18 VITALS — BP 155/85 | HR 75

## 2020-09-18 VITALS — BP 159/84 | HR 70 | Resp 18 | Wt 181.0 lb

## 2020-09-18 DIAGNOSIS — Z7982 Long term (current) use of aspirin: Secondary | ICD-10-CM | POA: Insufficient documentation

## 2020-09-18 DIAGNOSIS — Z85038 Personal history of other malignant neoplasm of large intestine: Secondary | ICD-10-CM | POA: Insufficient documentation

## 2020-09-18 DIAGNOSIS — T887XXA Unspecified adverse effect of drug or medicament, initial encounter: Secondary | ICD-10-CM

## 2020-09-18 DIAGNOSIS — I10 Essential (primary) hypertension: Secondary | ICD-10-CM | POA: Insufficient documentation

## 2020-09-18 DIAGNOSIS — Z95828 Presence of other vascular implants and grafts: Secondary | ICD-10-CM

## 2020-09-18 DIAGNOSIS — R001 Bradycardia, unspecified: Secondary | ICD-10-CM | POA: Diagnosis not present

## 2020-09-18 DIAGNOSIS — Z79899 Other long term (current) drug therapy: Secondary | ICD-10-CM | POA: Insufficient documentation

## 2020-09-18 DIAGNOSIS — Z8505 Personal history of malignant neoplasm of liver: Secondary | ICD-10-CM | POA: Diagnosis not present

## 2020-09-18 DIAGNOSIS — M461 Sacroiliitis, not elsewhere classified: Secondary | ICD-10-CM | POA: Insufficient documentation

## 2020-09-18 DIAGNOSIS — R42 Dizziness and giddiness: Secondary | ICD-10-CM | POA: Insufficient documentation

## 2020-09-18 DIAGNOSIS — Z87891 Personal history of nicotine dependence: Secondary | ICD-10-CM | POA: Insufficient documentation

## 2020-09-18 DIAGNOSIS — R61 Generalized hyperhidrosis: Secondary | ICD-10-CM | POA: Diagnosis not present

## 2020-09-18 DIAGNOSIS — R110 Nausea: Secondary | ICD-10-CM | POA: Diagnosis not present

## 2020-09-18 DIAGNOSIS — T451X5A Adverse effect of antineoplastic and immunosuppressive drugs, initial encounter: Secondary | ICD-10-CM | POA: Diagnosis not present

## 2020-09-18 DIAGNOSIS — E119 Type 2 diabetes mellitus without complications: Secondary | ICD-10-CM | POA: Insufficient documentation

## 2020-09-18 DIAGNOSIS — Z7189 Other specified counseling: Secondary | ICD-10-CM

## 2020-09-18 DIAGNOSIS — C221 Intrahepatic bile duct carcinoma: Secondary | ICD-10-CM

## 2020-09-18 LAB — CMP (CANCER CENTER ONLY)
ALT: 21 U/L (ref 0–44)
AST: 25 U/L (ref 15–41)
Albumin: 3.8 g/dL (ref 3.5–5.0)
Alkaline Phosphatase: 91 U/L (ref 38–126)
Anion gap: 7 (ref 5–15)
BUN: 10 mg/dL (ref 8–23)
CO2: 25 mmol/L (ref 22–32)
Calcium: 9.7 mg/dL (ref 8.9–10.3)
Chloride: 105 mmol/L (ref 98–111)
Creatinine: 0.67 mg/dL (ref 0.44–1.00)
GFR, Estimated: >60 mL/min (ref >60)
Glucose, Bld: 102 mg/dL — ABNORMAL HIGH (ref 70–99)
Potassium: 4.9 mmol/L (ref 3.5–5.1)
Sodium: 137 mmol/L (ref 135–145)
Total Bilirubin: 0.3 mg/dL (ref 0.3–1.2)
Total Protein: 8 g/dL (ref 6.5–8.1)

## 2020-09-18 LAB — CEA (IN HOUSE-CHCC): CEA (CHCC-In House): <1 ng/mL (ref 0.00–5.00)

## 2020-09-18 LAB — CBC WITH DIFFERENTIAL (CANCER CENTER ONLY)
Abs Immature Granulocytes: 0.01 10*3/uL (ref 0.00–0.07)
Basophils Absolute: 0 10*3/uL (ref 0.0–0.1)
Basophils Relative: 1 %
Eosinophils Absolute: 0.2 10*3/uL (ref 0.0–0.5)
Eosinophils Relative: 4 %
HCT: 34.9 % — ABNORMAL LOW (ref 36.0–46.0)
Hemoglobin: 11.8 g/dL — ABNORMAL LOW (ref 12.0–15.0)
Immature Granulocytes: 0 %
Lymphocytes Relative: 22 %
Lymphs Abs: 0.9 10*3/uL (ref 0.7–4.0)
MCH: 32 pg (ref 26.0–34.0)
MCHC: 33.8 g/dL (ref 30.0–36.0)
MCV: 94.6 fL (ref 80.0–100.0)
Monocytes Absolute: 0.6 10*3/uL (ref 0.1–1.0)
Monocytes Relative: 13 %
Neutro Abs: 2.5 10*3/uL (ref 1.7–7.7)
Neutrophils Relative %: 60 %
Platelet Count: 164 10*3/uL (ref 150–400)
RBC: 3.69 MIL/uL — ABNORMAL LOW (ref 3.87–5.11)
RDW: 14.2 % (ref 11.5–15.5)
WBC Count: 4.1 10*3/uL (ref 4.0–10.5)
nRBC: 0 % (ref 0.0–0.2)

## 2020-09-18 LAB — MAGNESIUM: Magnesium: 1.1 mg/dL — ABNORMAL LOW (ref 1.7–2.4)

## 2020-09-18 LAB — CBG MONITORING, ED: Glucose-Capillary: 172 mg/dL — ABNORMAL HIGH (ref 70–99)

## 2020-09-18 MED ORDER — HEPARIN SOD (PORK) LOCK FLUSH 100 UNIT/ML IV SOLN
500.0000 [IU] | Freq: Once | INTRAVENOUS | Status: DC | PRN
  Filled 2020-09-18: qty 5

## 2020-09-18 MED ORDER — FAMOTIDINE 20 MG IN NS 100 ML IVPB
20.0000 mg | Freq: Once | INTRAVENOUS | Status: AC | PRN
  Administered 2020-09-18: 20 mg via INTRAVENOUS

## 2020-09-18 MED ORDER — DIPHENHYDRAMINE HCL 50 MG/ML IJ SOLN
50.0000 mg | Freq: Once | INTRAMUSCULAR | Status: AC | PRN
  Administered 2020-09-18: 50 mg via INTRAVENOUS

## 2020-09-18 MED ORDER — MAGNESIUM SULFATE 4 GM/100ML IV SOLN
4.0000 g | Freq: Once | INTRAVENOUS | Status: AC
  Administered 2020-09-18: 4 g via INTRAVENOUS
  Filled 2020-09-18: qty 100

## 2020-09-18 MED ORDER — LEUCOVORIN CALCIUM INJECTION 350 MG
400.0000 mg/m2 | Freq: Once | INTRAVENOUS | Status: AC
  Administered 2020-09-18: 792 mg via INTRAVENOUS
  Filled 2020-09-18: qty 39.6

## 2020-09-18 MED ORDER — METHYLPREDNISOLONE SODIUM SUCC 125 MG IJ SOLR
125.0000 mg | Freq: Once | INTRAMUSCULAR | Status: AC | PRN
  Administered 2020-09-18: 125 mg via INTRAVENOUS

## 2020-09-18 MED ORDER — SODIUM CHLORIDE 0.9 % IV SOLN
10.0000 mg | Freq: Once | INTRAVENOUS | Status: AC
  Administered 2020-09-18: 10 mg via INTRAVENOUS
  Filled 2020-09-18: qty 10

## 2020-09-18 MED ORDER — SODIUM CHLORIDE 0.9 % IV SOLN
Freq: Once | INTRAVENOUS | Status: DC
  Filled 2020-09-18: qty 250

## 2020-09-18 MED ORDER — LORATADINE 10 MG PO TABS
ORAL_TABLET | ORAL | Status: AC
  Filled 2020-09-18: qty 1

## 2020-09-18 MED ORDER — OXALIPLATIN CHEMO INJECTION 100 MG/20ML
40.0000 mg/m2 | Freq: Once | INTRAVENOUS | Status: AC
  Administered 2020-09-18: 80 mg via INTRAVENOUS
  Filled 2020-09-18: qty 16

## 2020-09-18 MED ORDER — LORAZEPAM 2 MG/ML IJ SOLN
INTRAMUSCULAR | Status: AC
  Filled 2020-09-18: qty 1

## 2020-09-18 MED ORDER — SODIUM CHLORIDE 0.9 % IV SOLN
1800.0000 mg/m2 | INTRAVENOUS | Status: DC
  Filled 2020-09-18: qty 71

## 2020-09-18 MED ORDER — PALONOSETRON HCL INJECTION 0.25 MG/5ML
INTRAVENOUS | Status: AC
  Filled 2020-09-18: qty 5

## 2020-09-18 MED ORDER — HEPARIN SOD (PORK) LOCK FLUSH 100 UNIT/ML IV SOLN
500.0000 [IU] | Freq: Once | INTRAVENOUS | Status: AC
  Administered 2020-09-18: 500 [IU]
  Filled 2020-09-18: qty 5

## 2020-09-18 MED ORDER — LORATADINE 10 MG PO TABS
10.0000 mg | ORAL_TABLET | Freq: Every day | ORAL | Status: DC
  Administered 2020-09-18: 10 mg via ORAL

## 2020-09-18 MED ORDER — SODIUM CHLORIDE 0.9% FLUSH
10.0000 mL | INTRAVENOUS | Status: DC | PRN
  Filled 2020-09-18: qty 10

## 2020-09-18 MED ORDER — SODIUM CHLORIDE 0.9% FLUSH
10.0000 mL | Freq: Once | INTRAVENOUS | Status: AC
  Administered 2020-09-18: 10 mL
  Filled 2020-09-18: qty 10

## 2020-09-18 MED ORDER — DEXTROSE 5 % IV SOLN
Freq: Once | INTRAVENOUS | Status: AC
Start: 2020-09-18 — End: 2020-09-18
  Filled 2020-09-18: qty 250

## 2020-09-18 MED ORDER — SODIUM CHLORIDE 0.9 % IV SOLN
Freq: Once | INTRAVENOUS | Status: AC
Start: 2020-09-18 — End: 2020-09-18
  Filled 2020-09-18: qty 250

## 2020-09-18 MED ORDER — PALONOSETRON HCL INJECTION 0.25 MG/5ML
0.2500 mg | Freq: Once | INTRAVENOUS | Status: AC
  Administered 2020-09-18: 0.25 mg via INTRAVENOUS

## 2020-09-18 MED ORDER — PROCHLORPERAZINE MALEATE 10 MG PO TABS
ORAL_TABLET | ORAL | Status: AC
  Filled 2020-09-18: qty 1

## 2020-09-18 MED ORDER — SODIUM CHLORIDE 0.9 % IV SOLN
6.0000 mg/kg | Freq: Once | INTRAVENOUS | Status: AC
  Administered 2020-09-18: 500 mg via INTRAVENOUS
  Filled 2020-09-18: qty 20

## 2020-09-18 NOTE — Progress Notes (Signed)
Approximately 5 minutes into oxaliplatin infusion, patient began to complain of nausea- which she states she felt during last oxaliplatin infusion. Patient then became diaphoretic and short of breath. Infusion paused and Lacie, NP was called to infusion room. Infusion stopped and flushed with D5, normal saline given wide open. 20 mg of IV pepcid, 125 mg solumedrol, 50mg  benadryl given. Vitals taken immediately which showed bradycardia (HR 48-53) and hypotension. After emergency medications given, HR was 75, BP 155/85 and 100% O2 2L. Patient taken to the ER for further evaluation but was stable and alert upon leaving infusion room. Patient's sister, Hoyle Sauer, was notified of event per patient's request.

## 2020-09-18 NOTE — ED Triage Notes (Signed)
Patient brought over from the cancer center after an allergic reaction after starting her chemo infusion day 1 cycle 18.  Patient only received about 15 cc of the chemo medication.  Patient has received the medication before but has only ever experience nausea with those reactions.  This time her reaction was respiratory distress, hypotension, brachycardia. and nausea.  Patient was pepcid, solumedrol, and benadryl IV.  Patient has receive dexamethazone as a pre med, clartin, and aloxi.  Patient also got 4 g of magnesium, she is chronically low.

## 2020-09-18 NOTE — ED Triage Notes (Signed)
Pt brought over from cancer center by rapid response. Pt had an allergic reaction from chemo. Began with nausea and then went into respiratory distress. Became hypotensive and brady.

## 2020-09-18 NOTE — Patient Instructions (Addendum)
[facility]  Discharge Instructions: Thank you for choosing [facility] to provide your oncology and hematology care.   If you have a lab appointment with the [facility], please go directly to the [facility] and check in at the registration area.   Wear comfortable clothing and clothing appropriate for easy access to any Portacath or PICC line.   We strive to give you quality time with your provider. You may need to reschedule your appointment if you arrive late (15 or more minutes).  Arriving late affects you and other patients whose appointments are after yours.  Also, if you miss three or more appointments without notifying the office, you may be dismissed from the clinic at the provider's discretion.      For prescription refill requests, have your pharmacy contact our office and allow 72 hours for refills to be completed.    Today you received the following chemotherapy and/or immunotherapy agents Vectibix, Oxaliplatin, Leucovorin, 5FU      To help prevent nausea and vomiting after your treatment, we encourage you to take your nausea medication as directed.  BELOW ARE SYMPTOMS THAT SHOULD BE REPORTED IMMEDIATELY: *FEVER GREATER THAN 100.4 F (38 C) OR HIGHER *CHILLS OR SWEATING *NAUSEA AND VOMITING THAT IS NOT CONTROLLED WITH YOUR NAUSEA MEDICATION *UNUSUAL SHORTNESS OF BREATH *UNUSUAL BRUISING OR BLEEDING *URINARY PROBLEMS (pain or burning when urinating, or frequent urination) *BOWEL PROBLEMS (unusual diarrhea, constipation, pain near the anus) TENDERNESS IN MOUTH AND THROAT WITH OR WITHOUT PRESENCE OF ULCERS (sore throat, sores in mouth, or a toothache) UNUSUAL RASH, SWELLING OR PAIN  UNUSUAL VAGINAL DISCHARGE OR ITCHING   Items with * indicate a potential emergency and should be followed up as soon as possible or go to the Emergency Department if any problems should occur.  Please show the CHEMOTHERAPY ALERT CARD or  IMMUNOTHERAPY ALERT CARD at check-in to the Emergency Department and triage nurse.  Should you have questions after your visit or need to cancel or reschedule your appointment, please contact [facility]  Dept: [phone]  and follow the prompts.  Office hours are 8:00 a.m. to 4:30 p.m. Monday - Friday. Please note that voicemails left after 4:00 p.m. may not be returned until the following business day.  We are closed weekends and major holidays. You have access to a nurse at all times for urgent questions. Please call the main number to the clinic Dept: [phone] and follow the prompts.   For any non-urgent questions, you may also contact your provider using MyChart. We now offer e-Visits for anyone 55 and older to request care online for non-urgent symptoms. For details visit [email or web address].   Also download the MyChart app! Go to the app store, search "MyChart", open the app, select [facility], and log in with your MyChart username and password.  Due to Covid, a mask is required upon entering the hospital/clinic. If you do not have a mask, one will be given to you upon arrival. For doctor visits, patients may have 1 support person aged 58 or older with them. For treatment visits, patients cannot have anyone with them due to current Covid guidelines and our immunocompromised population.    Hypomagnesemia Hypomagnesemia is a condition in which the level of magnesium in the blood is low. Magnesium is a mineral that is found in many foods. It is used in many different processes in the body. Hypomagnesemia can affect every organ in thebody. In severe  cases, it can cause life-threatening problems. What are the causes? This condition may be caused by: Not getting enough magnesium in your diet. Malnutrition. Problems with absorbing magnesium from the intestines. Dehydration. Alcohol abuse. Vomiting. Severe or chronic diarrhea. Some medicines, including  medicines that make you urinate more (diuretics). Certain diseases, such as kidney disease, diabetes, celiac disease, and overactive thyroid. What are the signs or symptoms? Symptoms of this condition include: Loss of appetite. Nausea and vomiting. Involuntary shaking or trembling of a body part (tremor). Muscle weakness. Tingling in the arms and legs. Sudden tightening of muscles (muscle spasms). Confusion. Psychiatric issues, such as depression, irritability, or psychosis. A feeling of fluttering of the heart. Seizures. These symptoms are more severe if magnesium levels drop suddenly. How is this diagnosed? This condition may be diagnosed based on: Your symptoms and medical history. A physical exam. Blood and urine tests. How is this treated? Treatment depends on the cause and the severity of the condition. It may be treated with: A magnesium supplement. This can be taken in pill form. If the condition is severe, magnesium is usually given through an IV. Changes to your diet. You may be directed to eat foods that have a lot of magnesium, such as green leafy vegetables, peas, beans, and nuts. Stopping any intake of alcohol. Follow these instructions at home:     Make sure that your diet includes foods with magnesium. Foods that have a lot of magnesium in them include: Green leafy vegetables, such as spinach and broccoli. Beans and peas. Nuts and seeds, such as almonds and sunflower seeds. Whole grains, such as whole grain bread and fortified cereals. Take magnesium supplements if your health care provider tells you to do that. Take them as directed. Take over-the-counter and prescription medicines only as told by your health care provider. Have your magnesium levels monitored as told by your health care provider. When you are active, drink fluids that contain electrolytes. Avoid drinking alcohol. Keep all follow-up visits as told by your health care provider. This is  important. Contact a health care provider if: You get worse instead of better. Your symptoms return. Get help right away if you: Develop severe muscle weakness. Have trouble breathing. Feel that your heart is racing. Summary Hypomagnesemia is a condition in which the level of magnesium in the blood is low. Hypomagnesemia can affect every organ in the body. Treatment may include eating more foods that contain magnesium, taking magnesium supplements, and not drinking alcohol. Have your magnesium levels monitored as told by your health care provider. This information is not intended to replace advice given to you by your health care provider. Make sure you discuss any questions you have with your healthcare provider. Document Revised: 07/27/2019 Document Reviewed: 07/27/2019 Elsevier Patient Education  [place].

## 2020-09-18 NOTE — Discharge Instructions (Addendum)
You are seen in the emergency department after he had a reaction to your cancer medication infusion.  This medicine was stopped.  You should have a discussion with the oncologist about avoiding his medication in the future.  It was added to your allergy list.  You were watched in the emergency department for approximately 3 hours, and remained stable with normal vital signs.  I felt it was safe to discharge home at this time.  You received all the appropriate medications to treat for an allergic reaction while at the cancer center.

## 2020-09-18 NOTE — ED Provider Notes (Signed)
[facility] DEPT Provider Note   CSN: [id number] Arrival date & time: 09/18/20  1518     History Chief Complaint  Patient presents with   Allergic Reaction    Lindsay [name] is a 65 y.o. 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  Nurses from cancer center report that the patient had begun her typical infusion (she has had before, Oxaliplatin), and developed nausea, sweating, and lightheadedness.  Her BP was "low" but unclear how low, and hr HR dropped to 49.  A rapid response was called.  She was given IV solumedrol, IV benadryl, and IV pepcid and brought to the Ed.  On her arrival the patient is awake, alert, and asymptomatic.  Denies CP, SOB.  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   Pelvic prolapse 07/16/2016    Past Surgical History:  Procedure Laterality Date   ABDOMINAL HYSTERECTOMY     ANTERIOR AND POSTERIOR REPAIR WITH SACROSPINOUS FIXATION N/A 07/16/2016   Procedure: ANTERIOR AND POSTERIOR REPAIR WITH SACROSPINOUS FIXATION;  Surgeon: Everlene Farrier, MD;  Location: [facility];  Service: Gynecology;  Laterality: N/A;   CYSTOSCOPY  07/16/2016   Procedure: CYSTOSCOPY;  Surgeon: Everlene Farrier, MD;  Location: [facility];  Service: Gynecology;;   ESOPHAGOGASTRODUODENOSCOPY (EGD) WITH PROPOFOL N/A 12/22/2019   Procedure:  ESOPHAGOGASTRODUODENOSCOPY (EGD) WITH PROPOFOL;  Surgeon: [name], MD;  Location: [facility];  Service: Endoscopy;  Laterality: N/A;   FINE NEEDLE ASPIRATION N/A 12/22/2019   Procedure: FINE NEEDLE ASPIRATION (FNA) LINEAR;  Surgeon: [name], MD;  Location: [facility];  Service: Endoscopy;  Laterality: N/A;   IR IMAGING GUIDED PORT INSERTION  01/24/2020   LAPAROSCOPIC VAGINAL HYSTERECTOMY WITH SALPINGECTOMY Bilateral 07/16/2016   Procedure: LAPAROSCOPIC ASSISTED VAGINAL HYSTERECTOMY WITH SALPINGECTOMY;  Surgeon: Everlene Farrier, MD;  Location: [facility];  Service: Gynecology;  Laterality: Bilateral;   MYOMECTOMY ABDOMINAL APPROACH     THYROID SURGERY     tyroid     UPPER ESOPHAGEAL ENDOSCOPIC ULTRASOUND (EUS) N/A 12/22/2019   Procedure: UPPER ESOPHAGEAL ENDOSCOPIC ULTRASOUND (EUS);  Surgeon: [name], MD;  Location: [facility];  Service: Endoscopy;  Laterality: N/A;     OB History   No obstetric history on file.     Family History  Problem Relation Age of Onset   Aneurysm Mother    Asthma Father     Social History   Tobacco Use   Smoking status: Former    Packs/day: 0.25    Years: 18.00    Pack years: 4.50    Types: Cigarettes    Quit date: 06/06/2018    Years since quitting: 2.2   Smokeless tobacco: Never  Vaping Use   Vaping Use: Never used  Substance Use Topics   Alcohol use: Yes    Alcohol/week: 3.0 standard drinks    Types: 3 Shots of  liquor per week    Comment: socially   Drug use: No    Home Medications Prior to Admission medications   Medication Sig Start Date End Date Taking? Authorizing Provider  aspirin EC 81 MG tablet Take 1 tablet (81 mg total) by mouth daily. 10/12/19   Adrian Prows, MD  atorvastatin (LIPITOR) 20 MG tablet Take 20 mg by mouth at bedtime.  07/03/16   [provider]  clindamycin (CLINDAGEL) 1 % gel Apply topically 2 (two) times daily. 01/30/20   Alla Feeling, NP  clobetasol cream (TEMOVATE) 2.72 % Apply 1  application topically daily.    [provider]  doxycycline (VIBRA-TABS) 100 MG tablet Take 1 tablet (100 mg total) by mouth 2 (two) times daily. 03/05/20   Alla Feeling, NP  esomeprazole (NEXIUM) 40 MG capsule Take 1 capsule (40 mg total) by mouth daily before breakfast. 07/24/20   [name], MD  HYDROcodone bit-homatropine (HYCODAN) 5-1.5 MG/5ML syrup hydrocodone-homatropine 5 mg-1.5 mg/5 mL oral syrup    [provider]  lidocaine-prilocaine (EMLA) cream Apply 1 application topically as needed. 07/24/20   [name], MD  lisinopril-hydrochlorothiazide (ZESTORETIC) 20-12.5 MG tablet Take 1 tablet by mouth daily with breakfast.     [provider]  magic mouthwash w/lidocaine SOLN Take 5 mLs by mouth 4 (four) times daily. 05/29/20   [name], MD  magnesium oxide (MAG-OX) 400 (241.3 Mg) MG tablet Take 1 tablet (400 mg total) by mouth in the morning, at noon, and at bedtime. 04/29/20   [name], MD  magnesium oxide (MAG-OX) 400 MG tablet Take 2 tablets (800 mg total) by mouth 3 (three) times daily. 09/06/20   [name], MD  ondansetron (ZOFRAN) 8 MG tablet Take 1 tablet (8 mg total) by mouth every 8 (eight) hours as needed for nausea or vomiting. 01/01/20   [name], MD  potassium chloride SA (KLOR-CON) 20 MEQ tablet Take 1 tablet (20 mEq total) by mouth 2 (two) times daily. 08/07/20   Alla Feeling, NP  prochlorperazine (COMPAZINE) 10 MG tablet Take 1 tablet (10 mg total) by mouth every 6 (six) hours as needed for nausea or vomiting. 06/12/20   [name], MD  spironolactone (ALDACTONE) 25 MG tablet Take 25 mg by mouth daily with breakfast. 09/19/19   [provider]  traMADol (ULTRAM) 50 MG tablet Take 1 tablet (50 mg total) by mouth every 6 (six) hours as needed. 01/01/20   [name], MD  valACYclovir (VALTREX) 1000 MG tablet Take 1 tablet (1,000 mg total) by mouth 2 (two) times daily. 06/26/20   [name], MD    Allergies    Oxaliplatin  Review of Systems    Review of Systems  Constitutional:  Positive for diaphoresis. Negative for chills and fever.  HENT:  Negative for ear pain and sore throat.   Eyes:  Negative for pain and visual disturbance.  Respiratory:  Positive for shortness of breath. Negative for cough.   Cardiovascular:  Negative for chest pain and palpitations.  Gastrointestinal:  Positive for nausea. Negative for abdominal pain and vomiting.  Genitourinary:  Negative for dysuria and hematuria.  Musculoskeletal:  Negative for arthralgias and back pain.  Skin:  Negative for color change and rash.  Neurological:  Positive for light-headedness. Negative for syncope.  All other systems reviewed and are negative.  Physical Exam Updated Vital Signs BP 118/69 (BP Location: Right Arm)   Pulse 68   Temp 97.9 F (36.6 C) (Oral)  Resp 16   Ht 5\' 6"  (1.676 m)   Wt 82.1 kg   SpO2 100%   BMI 29.21 kg/m   Physical Exam Constitutional:      General: She is not in acute distress. HENT:     Head: Normocephalic and atraumatic.  Eyes:     Conjunctiva/sclera: Conjunctivae normal.     Pupils: Pupils are equal, round, and reactive to light.  Cardiovascular:     Rate and Rhythm: Normal rate and regular rhythm.     Pulses: Normal pulses.  Pulmonary:     Effort: Pulmonary effort is normal. No respiratory distress.  Abdominal:     General: There is no distension.     Tenderness: There is no abdominal tenderness.  Musculoskeletal:     Comments: Chest port right chest wall  Skin:    General: Skin is warm and dry.  Neurological:     General: No focal deficit present.     Mental Status: She is alert and oriented to person, place, and time. Mental status is at baseline.  Psychiatric:        Mood and Affect: Mood normal.        Behavior: Behavior normal.    ED Results / Procedures / Treatments   Labs (all labs ordered are listed, but only abnormal results are displayed) Labs Reviewed  CBG MONITORING, ED - Abnormal; Notable for the  following components:      Result Value   Glucose-Capillary 172 (*)    All other components within normal limits    EKG EKG Interpretation  Date/Time:  Wednesday September 18 2020 15:51:26 EDT Ventricular Rate:  78 PR Interval:  174 QRS Duration: 78 QT Interval:  394 QTC Calculation: 449 R Axis:   58 Text Interpretation: Sinus rhythm Abnormal R-wave progression, early transition No STEMI Confirmed by Octaviano Glow [phone]) on 09/18/2020 5:33:57 PM  Radiology No results found.  Procedures Procedures   Medications Ordered in ED Medications  heparin lock flush 100 unit/mL (500 Units Intracatheter Given 09/18/20 1806)    ED Course  I have reviewed the triage vital signs and the nursing notes.  Pertinent labs & imaging results that were available during my care of the patient were reviewed by me and considered in my medical decision making (see chart for details).  Pt here with transient hypotension and bradycardia while receiving chemo infusion today.  I suspect this is reaction to her infusion medication.  She's back to her baseline mental status, vitals normal on arrival.  Doubt PE, ACS, PTX.  No respiratory distress.    I would hold epi now as she is sympatomatic, no signs of anaphylaxis  Mg was 1.1 today on labs - she received 2 g IV mag CMP reviewed from this morning - unremarkable CBC reviewed , WBC 4.1, Hgb stable at 11.8.  not leukopenic. Doubt sepsis, infection  Will monitor, obtain ECG, reassess  Clinical Course as of 09/18/20 2246  Wed Sep 18, 2020  1733 Vitals been stable, the patient appears comfortable. [MT]  0867 She remains asymptomatic completely normal vitals.  Okay for discharge. [MT]    Clinical Course User Index [MT] Kimberly Coye, Carola Rhine, MD    Final Clinical Impression(s) / ED Diagnoses Final diagnoses:  Medication side effect    Rx / DC Orders ED Discharge Orders     None        Wyvonnia Dusky, MD 09/18/20 2246

## 2020-09-19 ENCOUNTER — Inpatient Hospital Stay: Payer: Medicare (Managed Care)

## 2020-09-19 DIAGNOSIS — C221 Intrahepatic bile duct carcinoma: Secondary | ICD-10-CM

## 2020-09-19 DIAGNOSIS — Z5112 Encounter for antineoplastic immunotherapy: Secondary | ICD-10-CM | POA: Diagnosis not present

## 2020-09-19 MED ORDER — SODIUM CHLORIDE 0.9 % IV SOLN
1800.0000 mg/m2 | INTRAVENOUS | Status: DC
  Administered 2020-09-19: 3550 mg via INTRAVENOUS
  Filled 2020-09-19: qty 71

## 2020-09-19 NOTE — Patient Instructions (Signed)
[facility]  Discharge Instructions: Thank you for choosing [facility] to provide your oncology and hematology care.   If you have a lab appointment with the [facility], please go directly to the [facility] and check in at the registration area.   Wear comfortable clothing and clothing appropriate for easy access to any Portacath or PICC line.   We strive to give you quality time with your provider. You may need to reschedule your appointment if you arrive late (15 or more minutes).  Arriving late affects you and other patients whose appointments are after yours.  Also, if you miss three or more appointments without notifying the office, you may be dismissed from the clinic at the provider's discretion.      For prescription refill requests, have your pharmacy contact our office and allow 72 hours for refills to be completed.    Today you received the following chemotherapy and/or immunotherapy agents fluorourcil      To help prevent nausea and vomiting after your treatment, we encourage you to take your nausea medication as directed.  BELOW ARE SYMPTOMS THAT SHOULD BE REPORTED IMMEDIATELY: *FEVER GREATER THAN 100.4 F (38 C) OR HIGHER *CHILLS OR SWEATING *NAUSEA AND VOMITING THAT IS NOT CONTROLLED WITH YOUR NAUSEA MEDICATION *UNUSUAL SHORTNESS OF BREATH *UNUSUAL BRUISING OR BLEEDING *URINARY PROBLEMS (pain or burning when urinating, or frequent urination) *BOWEL PROBLEMS (unusual diarrhea, constipation, pain near the anus) TENDERNESS IN MOUTH AND THROAT WITH OR WITHOUT PRESENCE OF ULCERS (sore throat, sores in mouth, or a toothache) UNUSUAL RASH, SWELLING OR PAIN  UNUSUAL VAGINAL DISCHARGE OR ITCHING   Items with * indicate a potential emergency and should be followed up as soon as possible or go to the Emergency Department if any problems should occur.  Please show the CHEMOTHERAPY ALERT CARD or IMMUNOTHERAPY ALERT CARD at check-in to  the Emergency Department and triage nurse.  Should you have questions after your visit or need to cancel or reschedule your appointment, please contact [facility]  Dept: [phone]  and follow the prompts.  Office hours are 8:00 a.m. to 4:30 p.m. Monday - Friday. Please note that voicemails left after 4:00 p.m. may not be returned until the following business day.  We are closed weekends and major holidays. You have access to a nurse at all times for urgent questions. Please call the main number to the clinic Dept: [phone] and follow the prompts.   For any non-urgent questions, you may also contact your provider using MyChart. We now offer e-Visits for anyone 61 and older to request care online for non-urgent symptoms. For details visit [email or web address].   Also download the MyChart app! Go to the app store, search "MyChart", open the app, select [facility], and log in with your MyChart username and password.  Due to Covid, a mask is required upon entering the hospital/clinic. If you do not have a mask, one will be given to you upon arrival. For doctor visits, patients may have 1 support person aged 8 or older with them. For treatment visits, patients cannot have anyone with them due to current Covid guidelines and our immunocompromised population.

## 2020-09-20 ENCOUNTER — Inpatient Hospital Stay: Payer: Medicare (Managed Care)

## 2020-09-20 ENCOUNTER — Other Ambulatory Visit: Payer: Self-pay

## 2020-09-20 ENCOUNTER — Telehealth: Payer: Self-pay | Admitting: Hematology

## 2020-09-20 DIAGNOSIS — Z5112 Encounter for antineoplastic immunotherapy: Secondary | ICD-10-CM | POA: Diagnosis not present

## 2020-09-20 MED ORDER — MAGNESIUM SULFATE 4 GM/100ML IV SOLN
4.0000 g | Freq: Once | INTRAVENOUS | Status: AC
  Administered 2020-09-20: 4 g via INTRAVENOUS
  Filled 2020-09-20: qty 100

## 2020-09-20 NOTE — Patient Instructions (Addendum)
[facility] Discharge Instructions for Patients receiving Home Portable Chemo Pump    **The bag should finish at 46 hours, 96 hours or 7 days. For example, if your pump is scheduled for 46 hours and it was put on at 4pm, it should finish at 2 pm the day it is scheduled to come off regardless of your appointment time.    Estimated time to finish  0845 (Have your nurse fill in)     ** if the display on your pump reads "Low Volume" and it is beeping, take the batteries out of the pump and come to the cancer center for it to be taken off.   **If the pump alarms go off prior to the pump reading "Low Volume" then call the [phone] and someone can assist you.  **If the plunger comes out and the bag fluid is running out, please use your chemo spill kit to clean up the spill. Do not use paper towels or other house hold products.  ** If you have problems or questions regarding your pump, please call either the [phone] or the cancer center Monday-Friday 8:00am-4:30pm at [phone] and we will assist you.  If you are unable to get assistance then go to [facility] Emergency Room, ask the staff to contact the IV team for assistance.

## 2020-09-20 NOTE — Telephone Encounter (Signed)
Scheduled follow-up appointment per 7/13 los. Patient is aware. 

## 2020-09-21 ENCOUNTER — Inpatient Hospital Stay: Payer: Medicare (Managed Care)

## 2020-09-21 ENCOUNTER — Other Ambulatory Visit: Payer: Self-pay

## 2020-09-21 VITALS — BP 137/91 | HR 70 | Temp 98.6°F | Resp 18

## 2020-09-21 DIAGNOSIS — C221 Intrahepatic bile duct carcinoma: Secondary | ICD-10-CM

## 2020-09-21 DIAGNOSIS — Z5112 Encounter for antineoplastic immunotherapy: Secondary | ICD-10-CM | POA: Diagnosis not present

## 2020-09-21 MED ORDER — SODIUM CHLORIDE 0.9% FLUSH
10.0000 mL | INTRAVENOUS | Status: DC | PRN
  Administered 2020-09-21: 10 mL
  Filled 2020-09-21: qty 10

## 2020-09-21 MED ORDER — HEPARIN SOD (PORK) LOCK FLUSH 100 UNIT/ML IV SOLN
500.0000 [IU] | Freq: Once | INTRAVENOUS | Status: AC | PRN
  Administered 2020-09-21: 500 [IU]
  Filled 2020-09-21: qty 5

## 2020-10-01 MED FILL — Dexamethasone Sodium Phosphate Inj 100 MG/10ML: INTRAMUSCULAR | Qty: 1 | Status: AC

## 2020-10-02 ENCOUNTER — Inpatient Hospital Stay: Payer: Medicare (Managed Care)

## 2020-10-02 ENCOUNTER — Encounter: Payer: Self-pay | Admitting: Hematology

## 2020-10-02 ENCOUNTER — Other Ambulatory Visit: Payer: Self-pay

## 2020-10-02 ENCOUNTER — Inpatient Hospital Stay (HOSPITAL_BASED_OUTPATIENT_CLINIC_OR_DEPARTMENT_OTHER): Payer: Medicare (Managed Care) | Admitting: Hematology

## 2020-10-02 VITALS — BP 142/90 | HR 69 | Temp 98.8°F | Resp 18 | Ht 66.0 in | Wt 180.4 lb

## 2020-10-02 VITALS — BP 152/95 | HR 77 | Resp 17

## 2020-10-02 DIAGNOSIS — Z5112 Encounter for antineoplastic immunotherapy: Secondary | ICD-10-CM | POA: Diagnosis not present

## 2020-10-02 DIAGNOSIS — C221 Intrahepatic bile duct carcinoma: Secondary | ICD-10-CM | POA: Diagnosis not present

## 2020-10-02 DIAGNOSIS — Z7189 Other specified counseling: Secondary | ICD-10-CM

## 2020-10-02 DIAGNOSIS — Z95828 Presence of other vascular implants and grafts: Secondary | ICD-10-CM

## 2020-10-02 LAB — CBC WITH DIFFERENTIAL (CANCER CENTER ONLY)
Abs Immature Granulocytes: 0.02 10*3/uL (ref 0.00–0.07)
Basophils Absolute: 0 10*3/uL (ref 0.0–0.1)
Basophils Relative: 1 %
Eosinophils Absolute: 0.2 10*3/uL (ref 0.0–0.5)
Eosinophils Relative: 3 %
HCT: 35.6 % — ABNORMAL LOW (ref 36.0–46.0)
Hemoglobin: 12.4 g/dL (ref 12.0–15.0)
Immature Granulocytes: 0 %
Lymphocytes Relative: 20 %
Lymphs Abs: 1.2 10*3/uL (ref 0.7–4.0)
MCH: 32.5 pg (ref 26.0–34.0)
MCHC: 34.8 g/dL (ref 30.0–36.0)
MCV: 93.2 fL (ref 80.0–100.0)
Monocytes Absolute: 0.4 10*3/uL (ref 0.1–1.0)
Monocytes Relative: 8 %
Neutro Abs: 3.9 10*3/uL (ref 1.7–7.7)
Neutrophils Relative %: 68 %
Platelet Count: 213 10*3/uL (ref 150–400)
RBC: 3.82 MIL/uL — ABNORMAL LOW (ref 3.87–5.11)
RDW: 14.4 % (ref 11.5–15.5)
WBC Count: 5.7 10*3/uL (ref 4.0–10.5)
nRBC: 0 % (ref 0.0–0.2)

## 2020-10-02 LAB — MAGNESIUM: Magnesium: 1.2 mg/dL — ABNORMAL LOW (ref 1.7–2.4)

## 2020-10-02 LAB — CMP (CANCER CENTER ONLY)
ALT: 27 U/L (ref 0–44)
AST: 30 U/L (ref 15–41)
Albumin: 3.9 g/dL (ref 3.5–5.0)
Alkaline Phosphatase: 86 U/L (ref 38–126)
Anion gap: 10 (ref 5–15)
BUN: 9 mg/dL (ref 8–23)
CO2: 26 mmol/L (ref 22–32)
Calcium: 9.4 mg/dL (ref 8.9–10.3)
Chloride: 105 mmol/L (ref 98–111)
Creatinine: 0.71 mg/dL (ref 0.44–1.00)
GFR, Estimated: >60 mL/min (ref >60)
Glucose, Bld: 104 mg/dL — ABNORMAL HIGH (ref 70–99)
Potassium: 4.1 mmol/L (ref 3.5–5.1)
Sodium: 141 mmol/L (ref 135–145)
Total Bilirubin: 0.3 mg/dL (ref 0.3–1.2)
Total Protein: 7.9 g/dL (ref 6.5–8.1)

## 2020-10-02 MED ORDER — SODIUM CHLORIDE 0.9 % IV SOLN
Freq: Once | INTRAVENOUS | Status: AC
  Filled 2020-10-02: qty 250

## 2020-10-02 MED ORDER — PALONOSETRON HCL INJECTION 0.25 MG/5ML
0.2500 mg | Freq: Once | INTRAVENOUS | Status: AC
  Administered 2020-10-02: 0.25 mg via INTRAVENOUS

## 2020-10-02 MED ORDER — SODIUM CHLORIDE 0.9% FLUSH
10.0000 mL | INTRAVENOUS | Status: DC | PRN
  Administered 2020-10-02: 10 mL
  Filled 2020-10-02: qty 10

## 2020-10-02 MED ORDER — PALONOSETRON HCL INJECTION 0.25 MG/5ML
INTRAVENOUS | Status: AC
  Filled 2020-10-02: qty 5

## 2020-10-02 MED ORDER — CAPECITABINE 500 MG PO TABS
1000.0000 mg/m2 | ORAL_TABLET | Freq: Two times a day (BID) | ORAL | 0 refills | Status: DC
  Filled 2020-10-02: qty 112, 14d supply, fill #0

## 2020-10-02 MED ORDER — MAGNESIUM SULFATE 4 GM/100ML IV SOLN
4.0000 g | Freq: Once | INTRAVENOUS | Status: AC
  Administered 2020-10-02: 4 g via INTRAVENOUS
  Filled 2020-10-02: qty 100

## 2020-10-02 MED ORDER — SODIUM CHLORIDE 0.9 % IV SOLN
6.0000 mg/kg | Freq: Once | INTRAVENOUS | Status: AC
  Administered 2020-10-02: 500 mg via INTRAVENOUS
  Filled 2020-10-02: qty 20

## 2020-10-02 MED ORDER — SODIUM CHLORIDE 0.9% FLUSH
10.0000 mL | Freq: Once | INTRAVENOUS | Status: AC
  Administered 2020-10-02: 10 mL
  Filled 2020-10-02: qty 10

## 2020-10-02 MED ORDER — HEPARIN SOD (PORK) LOCK FLUSH 100 UNIT/ML IV SOLN
500.0000 [IU] | Freq: Once | INTRAVENOUS | Status: AC | PRN
  Administered 2020-10-02: 500 [IU]
  Filled 2020-10-02: qty 5

## 2020-10-02 MED ORDER — SODIUM CHLORIDE 0.9 % IV SOLN
1800.0000 mg/m2 | INTRAVENOUS | Status: DC
  Filled 2020-10-02: qty 71

## 2020-10-02 MED ORDER — LEUCOVORIN CALCIUM INJECTION 350 MG
400.0000 mg/m2 | Freq: Once | INTRAVENOUS | Status: AC
  Administered 2020-10-02: 792 mg via INTRAVENOUS
  Filled 2020-10-02: qty 39.6

## 2020-10-02 MED ORDER — PROCHLORPERAZINE EDISYLATE 10 MG/2ML IJ SOLN
INTRAMUSCULAR | Status: AC
  Filled 2020-10-02: qty 2

## 2020-10-02 NOTE — Progress Notes (Signed)
[facility]   [phone] Fax:([phone]) [phone]   Clinic Follow up Note   Patient Care Team: [name], MD as PCP - General (Internal Medicine) [name], RN (Inactive) as Oncology Nurse Navigator [name], MD as Consulting Physician (Oncology) [name], MD as Consulting Physician (Gastroenterology)  Date of Service:  10/02/2020  CHIEF COMPLAINT: Follow-up metastatic colon cancer  SUMMARY OF ONCOLOGIC HISTORY: Oncology History Overview Note  Cancer Staging No matching staging information was found for the patient.    metastatic colon cancer to liver  06/20/2019 Procedure   Colonoscopy by Dr [name] 06/20/19  IMPRESSION -Seven 3 to 10 mm polyps in the sigmoid colon, in the transverse colon and in the escending colon, removed with a cold snare. Resected and retrireved.  -One 6m polyp in the descending colon. Biopsies. Tattoes.  -Mediaum sized lipoma in the ascending colon.   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 See Comment B. Colon, Ascending, polyp, Polypectomy:  -TUBULAR ADENOMA -No high grade dysplasia or malignancy.  C. Colon, TRansverse, Polyo, polectomy:  -TUBULAR ADENOMA -No high grade dysplasia or malignancy.  D. Colon, Sigmoid, Polyp, Polypectomy:  -HYPERPLASTIC POLYP   11/07/2019 Imaging   [place]domen 11/07/19  IMPRESSION: 1. Two solid masses in the liver are nonspecific. Recommend MRI abdomen with and without contrast for further evaluation.   12/15/2019 Imaging   MRI Abdomen 12/15/19  IMPRESSION: 1. There are two large masses in the liver with appearance favoring metastatic disease or hepatocellular carcinoma or cholangiocarcinoma. A benign etiology is highly unlikely given the enhancement pattern and associated adenopathy. 2. Considerable porta hepatis and retroperitoneal adenopathy. Some of the confluent porta hepatis tumor is  potentially infiltrative and abuts the pancreatic body along its right upper margin, making it difficult to completely exclude the possibility of pancreatic adenocarcinoma primary. Possibilities helpful in further workup might include tissue diagnosis, endoscopic ultrasound, or nuclear medicine PET-CT. 3. Pancreas divisum. 4. Lumbar spondylosis and degenerative disc disease. 5. Despite efforts by the technologist and patient, motion artifact is present on today's exam and could not be eliminated. This reduces exam sensitivity and specificity.   12/22/2019 Procedure   Upper Endoscopy by Dr [name]10/15/21  IMPRESSION - One lymph node was visualized and measured in the porta hepatis region. Fine needle aspiration performed    12/22/2019 Initial Biopsy   A. LIVER, PORTA HEPATIS MASS, FINE NEEDLE 12/22/19 ASPIRATION:  FINAL MICROSCOPIC DIAGNOSIS:  - Malignant cells consistent with metastatic adenocarcinoma   01/01/2020 Initial Diagnosis   Intrahepatic cholangiocarcinoma ([facility] [place]   01/08/2020 Initial Biopsy   FINAL MICROSCOPIC DIAGNOSIS:   A. LIVER, LEFT LOBE, BIOPSY:  - Metastatic adenocarcinoma, consistent with a colorectal primary.  See  comment      COMMENT:   Immunohistochemical stains show the tumor cells are positive for CK20  and CDX2 but negative for CK7, consistent with above interpretation.  Dr. [name] notified on 01/10/2020   01/08/2020 Genetic Testing   [name]  [name] wildtype and [name]/[name] mutations which make her eligible for target biological agent Vectibix.   01/24/2020 Procedure   PAC placed 01/24/20   01/25/2020 -  Chemotherapy   first line FOLFOX starting 01/25/2020, Vextibix  added with C2 (02/06/20)   06/20/2020 Imaging   CT CAP  IMPRESSION: 1. Continued interval reduction in size and conspicuity of a subsolid nodule of the peripheral left upper lobe. 2. Unchanged prominent pretracheal and subcarinal lymph nodes. 3.  Redemonstrated partially  calcified low-attenuation liver masses, slightly decreased in size compared to prior examination. 4. Slight interval decrease in size of a portacaval lymph node or conglomerate and retroperitoneal lymph nodes. 5. Findings are consistent with continued treatment response of nodal, pulmonary, and hepatic metastatic disease. 6. Coronary artery disease.   Aortic Atherosclerosis (ICD10-I70.0).        CURRENT THERAPY:  First line FOLFOX starting 01/25/2020, Vextibix added with C2 (02/06/20)  INTERVAL HISTORY: Lindsay [name] is here for a follow up. She was last seen by me 09/04/20 and seen by NP Lacie in [facility]. She presents to the clinic alone.  She had a severe reaction to oxaliplatin last time, with diaphoresis and hypotension, was evaluated in the ED but released to home on same day.  She has recovered well. She reports continued neuropathy in her fingers. This is stable. She denies any issues with dropping items or being bothered in her sleep.  All other systems were reviewed with the patient and are negative.  MEDICAL HISTORY:  Past Medical History:  Diagnosis Date   Arthritis    Diabetes mellitus without complication ([place])    Hypertension     SURGICAL HISTORY: Past Surgical History:  Procedure Laterality Date   ABDOMINAL HYSTERECTOMY     ANTERIOR AND POSTERIOR REPAIR WITH SACROSPINOUS FIXATION N/A 07/16/2016   Procedure: ANTERIOR AND POSTERIOR REPAIR WITH SACROSPINOUS FIXATION;  Surgeon: Everlene Farrier, MD;  Location: [facility];  Service: Gynecology;  Laterality: N/A;   CYSTOSCOPY  07/16/2016   Procedure: CYSTOSCOPY;  Surgeon: Everlene Farrier, MD;  Location: [facility];  Service: Gynecology;;   ESOPHAGOGASTRODUODENOSCOPY (EGD) WITH PROPOFOL N/A 12/22/2019   Procedure: ESOPHAGOGASTRODUODENOSCOPY (EGD) WITH PROPOFOL;  Surgeon: [name], MD;  Location: [facility];  Service: Endoscopy;  Laterality: N/A;   FINE NEEDLE ASPIRATION N/A 12/22/2019   Procedure: FINE NEEDLE ASPIRATION  (FNA) LINEAR;  Surgeon: [name], MD;  Location: [facility];  Service: Endoscopy;  Laterality: N/A;   IR IMAGING GUIDED PORT INSERTION  01/24/2020   LAPAROSCOPIC VAGINAL HYSTERECTOMY WITH SALPINGECTOMY Bilateral 07/16/2016   Procedure: LAPAROSCOPIC ASSISTED VAGINAL HYSTERECTOMY WITH SALPINGECTOMY;  Surgeon: Everlene Farrier, MD;  Location: [facility];  Service: Gynecology;  Laterality: Bilateral;   MYOMECTOMY ABDOMINAL APPROACH     THYROID SURGERY     tyroid     UPPER ESOPHAGEAL ENDOSCOPIC ULTRASOUND (EUS) N/A 12/22/2019   Procedure: UPPER ESOPHAGEAL ENDOSCOPIC ULTRASOUND (EUS);  Surgeon: [name], MD;  Location: [facility];  Service: Endoscopy;  Laterality: N/A;    I have reviewed the social history and family history with the patient and they are unchanged from previous note.  ALLERGIES:  is allergic to oxaliplatin.  MEDICATIONS:  Current Outpatient Medications  Medication Sig Dispense Refill   aspirin EC 81 MG tablet Take 1 tablet (81 mg total) by mouth daily. 90 tablet 3   atorvastatin (LIPITOR) 20 MG tablet Take 20 mg by mouth at bedtime.   1   clindamycin (CLINDAGEL) 1 % gel Apply topically 2 (two) times daily. 60 g 2   clobetasol cream (TEMOVATE) 4.94 % Apply 1 application topically daily.     doxycycline (VIBRA-TABS) 100 MG tablet Take 1 tablet (100 mg total) by mouth 2 (two) times daily. 60 tablet 1   esomeprazole (NEXIUM) 40 MG capsule Take 1 capsule (40 mg total) by mouth daily before breakfast. 30 capsule 3   HYDROcodone bit-homatropine (HYCODAN) 5-1.5 MG/5ML syrup hydrocodone-homatropine 5 mg-1.5 mg/5 mL oral syrup     lidocaine-prilocaine (EMLA) cream Apply  1 application topically as needed. 30 g 1   lisinopril-hydrochlorothiazide (ZESTORETIC) 20-12.5 MG tablet Take 1 tablet by mouth daily with breakfast.      magic mouthwash w/lidocaine SOLN Take 5 mLs by mouth 4 (four) times daily. 475 mL 2   magnesium oxide (MAG-OX) 400 (241.3 Mg) MG tablet Take 1 tablet (400 mg  total) by mouth in the morning, at noon, and at bedtime. 90 tablet 2   magnesium oxide (MAG-OX) 400 MG tablet Take 2 tablets (800 mg total) by mouth 3 (three) times daily. 180 tablet 2   ondansetron (ZOFRAN) 8 MG tablet Take 1 tablet (8 mg total) by mouth every 8 (eight) hours as needed for nausea or vomiting. 20 tablet 1   potassium chloride SA (KLOR-CON) 20 MEQ tablet Take 1 tablet (20 mEq total) by mouth 2 (two) times daily. 60 tablet 1   prochlorperazine (COMPAZINE) 10 MG tablet Take 1 tablet (10 mg total) by mouth every 6 (six) hours as needed for nausea or vomiting. 30 tablet 2   spironolactone (ALDACTONE) 25 MG tablet Take 25 mg by mouth daily with breakfast.     traMADol (ULTRAM) 50 MG tablet Take 1 tablet (50 mg total) by mouth every 6 (six) hours as needed. 15 tablet 0   valACYclovir (VALTREX) 1000 MG tablet Take 1 tablet (1,000 mg total) by mouth 2 (two) times daily. 10 tablet 0   No current facility-administered medications for this visit.   Facility-Administered Medications Ordered in Other Visits  Medication Dose Route Frequency Provider Last Rate Last Admin   fluorouracil (ADRUCIL) 3,550 mg in sodium chloride 0.9 % 79 mL chemo infusion  1,800 mg/m2 (Treatment Plan Recorded) Intravenous 1 day or 1 dose [name], MD       sodium chloride flush (NS) 0.9 % injection 10 mL  10 mL Intracatheter PRN [name], MD   10 mL at 10/02/20 1652    PHYSICAL EXAMINATION: ECOG PERFORMANCE STATUS: 1 - Symptomatic but completely ambulatory  Vitals:   10/02/20 1203  BP: (!) 142/90  Pulse: 69  Resp: 18  Temp: 98.8 F (37.1 C)  SpO2: 100%    Filed Weights   10/02/20 1203  Weight: 180 lb 6.4 oz (81.8 kg)    GENERAL:alert, no distress and comfortable SKIN: skin color, texture, turgor are normal, no rashes or significant lesions EYES: normal, Conjunctiva are pink and non-injected, sclera clear  NEURO: alert & oriented x 3 with fluent speech, no focal motor/sensory deficits  LABORATORY  DATA:  I have reviewed the data as listed CBC Latest Ref Rng & Units 10/02/2020 09/18/2020 09/04/2020  WBC 4.0 - 10.5 K/uL 5.7 4.1 4.5  Hemoglobin 12.0 - 15.0 g/dL 12.4 11.8(L) 11.7(L)  Hematocrit 36.0 - 46.0 % 35.6(L) 34.9(L) 33.3(L)  Platelets 150 - 400 K/uL 213 164 178     CMP Latest Ref Rng & Units 10/02/2020 09/18/2020 09/04/2020  Glucose 70 - 99 mg/dL 104(H) 102(H) 110(H)  BUN 8 - 23 mg/dL 9 10 11   Creatinine 0.44 - 1.00 mg/dL 0.71 0.67 0.73  Sodium 135 - 145 mmol/L 141 137 139  Potassium 3.5 - 5.1 mmol/L 4.1 4.9 4.3  Chloride 98 - 111 mmol/L 105 105 105  CO2 22 - 32 mmol/L 26 25 23   Calcium 8.9 - 10.3 mg/dL 9.4 9.7 8.7(L)  Total Protein 6.5 - 8.1 g/dL 7.9 8.0 8.1  Total Bilirubin 0.3 - 1.2 mg/dL 0.3 0.3 0.3  Alkaline Phos 38 - 126 U/L 86 91 83  AST  15 - 41 U/L 30 25 29   ALT 0 - 44 U/L 27 21 25       RADIOGRAPHIC STUDIES: I have personally reviewed the radiological images as listed and agreed with the findings in the report. No results found.   ASSESSMENT & PLAN:  AUTYM SIESS is a 65 y.o. female with   1. 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 -Her EUS from 12/22/19 showed 3.5cm LN in porta hepatis region and biopsy confirmed metastatic adenocarcinoma. Her liver biopsy results confirmed adenocarcinoma, immunostain showed positive CDX2 and CK20, negative CK7, supporting primary colorectal cancer. -Initial PET from 01/12/20 showed known liver metastasis, and diffuse adenopathy in thoracic and abdomen. There is a hypermetabolic 1.8 cm pulmonary nodule in left upper lobe, and focal hypermetabolic lesion in the splenic fixture of the colon, concerning for primary tumor. -She underwent repeat colonoscopy and biopsy by Dr. Benson Norway in 01/2020. The overall picture is still consistent with colorectal primary.   -Although her cancer is not curable at this stage, it is still treatable. I started her on first-line FOLFOX q2weeks on 01/25/20. Vectibix was added with C2.  -06/20/20 CT CAP showed good response. She is scheduled for repeat on 10/09/20. -due to her recent reaction to Oxaliplatin, will stop oxaliplatin. -Lab reviewed, adequate for treatment, will proceed Vectibix, 5-FU and leucovorin today. -If her next restaging CT scan showed excellent response, will change her treatment to Xeloda and Vectibix maintenance therapy   2. Symptom Management: Acne skin rash, Skin toxicity, Constipation, Acid Reflux  -S/p C4 she has increased skin toxicity from 5FU. Dose reduced starting with C5. I encouraged her to keep her skin moisturized and clean. I also suggested cotton gloves.  -Her acne skin rash form Vectibix has been isolate to her face and mild-moderate. She can continue clindamycin and  Hydrocortisone.  -She also has dry skin leading to cracking. I discussed keeping hands, clean and moisturized. She may use gloves as well. -For mouth soreness, she continues mouth rinse daily. If not enough I will call in Magic mouth wash -For constipation, she can manage with Senakot-S (given miralax and ducolax has not helped). She can use Milk of Magnesium half bottle at a time for unresolved constipation.     3. Mild Neuropathy G1 -S/p C5 she has intermittent mild neuropathy with numbness in her fingertips only. -Oxaliplatin dose reduced from C6 and discontinued after reaction 09/18/20. -S/p C15-16 she has stable numbness in her fingers.    4. Chronic Lower abdominal pain, Nausea, secondary to #1 -She has had chronic low abdominal pain for 5 years ranging up to 4 times a week. She notes her pain worsened in the last 3 years, but no work-up until 2021.   -For pain she is now on Tramadol for severe pain. She can otherwise she Tylenol -For her intermittent nausea she has Zofran -I discussed to maintain weight she can  continue Ensure with high protein/high calorie diet and remain active. Continue to f/u with dietician. Weight is stable.     5. HTN, DM, Heart Murmur, Arthritis -On medications. Managed by her PCP and Cardiologist Dr Einar Gip   6. 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 -She lives with husband and has brothers and sister in and out of town. Her only child passed in recent years. -She notes her husband is aware  of her condition and treatment, but she keeps the information to a minimum as she feels he may not handle it well.    7. Goals of care -she understands the goal is palliative  -She is full code.    8. 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  -mag 0.8 toady, she will get 4g mag iv today and 4g on day 3 -will increase oral mag to 2 tabs tid     PLAN:  -lab reviewed, will proceed Vectibix, leucovorin and 5-FU pump infusion today -She is scheduled to have restaging CT scan next week -Lab, follow-up and chemo in 2 weeks   No problem-specific Assessment & Plan notes found for this encounter.   No orders of the defined types were placed in this encounter.   All questions were answered. The patient knows to call the clinic with any problems, questions or concerns. No barriers to learning was detected. The total time spent in the appointment was 30 minutes.     [name], MD 10/02/2020   Addendum Patient finished Vectibix infusion without any issues.  A few minutes after leucovorin was started, she became diaphoretic, borderline hypotensive with blood pressure 94/54, nausea, no vomiting, and complaints of epigastric discomfort.  I saw her in infusion room, started rapid IV fluids, she gradually improved in about 10 mins, EKG was unremarkable, she did not lose consciousness. Her presentation was most consistent with vasovagal syndrome, although not sure what triggered it.  I  canceled her leucovorin and 5-FU pump today.  Plan to change 5-FU to Xeloda, will start in 2 weeks.  Patient was discharged home in stable condition after she recovered.  [name]  10/02/2020 5pm

## 2020-10-02 NOTE — Patient Instructions (Addendum)
[facility]  Discharge Instructions: Thank you for choosing [facility] to provide your oncology and hematology care.   If you have a lab appointment with the [facility], please go directly to the [facility] and check in at the registration area.   Wear comfortable clothing and clothing appropriate for easy access to any Portacath or PICC line.   We strive to give you quality time with your provider. You may need to reschedule your appointment if you arrive late (15 or more minutes).  Arriving late affects you and other patients whose appointments are after yours.  Also, if you miss three or more appointments without notifying the office, you may be dismissed from the clinic at the provider's discretion.      For prescription refill requests, have your pharmacy contact our office and allow 72 hours for refills to be completed.    Today you received the following chemotherapy and/or immunotherapy agents: Vectibix.      To help prevent nausea and vomiting after your treatment, we encourage you to take your nausea medication as directed.  BELOW ARE SYMPTOMS THAT SHOULD BE REPORTED IMMEDIATELY: *FEVER GREATER THAN 100.4 F (38 C) OR HIGHER *CHILLS OR SWEATING *NAUSEA AND VOMITING THAT IS NOT CONTROLLED WITH YOUR NAUSEA MEDICATION *UNUSUAL SHORTNESS OF BREATH *UNUSUAL BRUISING OR BLEEDING *URINARY PROBLEMS (pain or burning when urinating, or frequent urination) *BOWEL PROBLEMS (unusual diarrhea, constipation, pain near the anus) TENDERNESS IN MOUTH AND THROAT WITH OR WITHOUT PRESENCE OF ULCERS (sore throat, sores in mouth, or a toothache) UNUSUAL RASH, SWELLING OR PAIN  UNUSUAL VAGINAL DISCHARGE OR ITCHING   Items with * indicate a potential emergency and should be followed up as soon as possible or go to the Emergency Department if any problems should occur.  Please show the CHEMOTHERAPY ALERT CARD or IMMUNOTHERAPY ALERT CARD at check-in to  the Emergency Department and triage nurse.  Should you have questions after your visit or need to cancel or reschedule your appointment, please contact [facility]  Dept: [phone]  and follow the prompts.  Office hours are 8:00 a.m. to 4:30 p.m. Monday - Friday. Please note that voicemails left after 4:00 p.m. may not be returned until the following business day.  We are closed weekends and major holidays. You have access to a nurse at all times for urgent questions. Please call the main number to the clinic Dept: [phone] and follow the prompts.   For any non-urgent questions, you may also contact your provider using MyChart. We now offer e-Visits for anyone 67 and older to request care online for non-urgent symptoms. For details visit [email or web address].   Also download the MyChart app! Go to the app store, search "MyChart", open the app, select [facility], and log in with your MyChart username and password.  Due to Covid, a mask is required upon entering the hospital/clinic. If you do not have a mask, one will be given to you upon arrival. For doctor visits, patients may have 1 support person aged 76 or older with them. For treatment visits, patients cannot have anyone with them due to current Covid guidelines and our immunocompromised population.   Hypomagnesemia Hypomagnesemia is a condition in which the level of magnesium in the blood is low. Magnesium is a mineral that is found in many foods. It is used in many different processes in the body. Hypomagnesemia can affect every organ in thebody. In severe cases, it can cause  life-threatening problems. What are the causes? This condition may be caused by: Not getting enough magnesium in your diet. Malnutrition. Problems with absorbing magnesium from the intestines. Dehydration. Alcohol abuse. Vomiting. Severe or chronic diarrhea. Some medicines, including medicines that make you urinate more  (diuretics). Certain diseases, such as kidney disease, diabetes, celiac disease, and overactive thyroid. What are the signs or symptoms? Symptoms of this condition include: Loss of appetite. Nausea and vomiting. Involuntary shaking or trembling of a body part (tremor). Muscle weakness. Tingling in the arms and legs. Sudden tightening of muscles (muscle spasms). Confusion. Psychiatric issues, such as depression, irritability, or psychosis. A feeling of fluttering of the heart. Seizures. These symptoms are more severe if magnesium levels drop suddenly. How is this diagnosed? This condition may be diagnosed based on: Your symptoms and medical history. A physical exam. Blood and urine tests. How is this treated? Treatment depends on the cause and the severity of the condition. It may be treated with: A magnesium supplement. This can be taken in pill form. If the condition is severe, magnesium is usually given through an IV. Changes to your diet. You may be directed to eat foods that have a lot of magnesium, such as green leafy vegetables, peas, beans, and nuts. Stopping any intake of alcohol. Follow these instructions at home:     Make sure that your diet includes foods with magnesium. Foods that have a lot of magnesium in them include: Green leafy vegetables, such as spinach and broccoli. Beans and peas. Nuts and seeds, such as almonds and sunflower seeds. Whole grains, such as whole grain bread and fortified cereals. Take magnesium supplements if your health care provider tells you to do that. Take them as directed. Take over-the-counter and prescription medicines only as told by your health care provider. Have your magnesium levels monitored as told by your health care provider. When you are active, drink fluids that contain electrolytes. Avoid drinking alcohol. Keep all follow-up visits as told by your health care provider. This is important. Contact a health care provider  if: You get worse instead of better. Your symptoms return. Get help right away if you: Develop severe muscle weakness. Have trouble breathing. Feel that your heart is racing. Summary Hypomagnesemia is a condition in which the level of magnesium in the blood is low. Hypomagnesemia can affect every organ in the body. Treatment may include eating more foods that contain magnesium, taking magnesium supplements, and not drinking alcohol. Have your magnesium levels monitored as told by your health care provider. This information is not intended to replace advice given to you by your health care provider. Make sure you discuss any questions you have with your healthcare provider. Document Revised: 07/27/2019 Document Reviewed: 07/27/2019 Elsevier Patient Education  [place].

## 2020-10-02 NOTE — Progress Notes (Signed)
Patient completed and tolerated Vectibix well with no issues or complaints.  Approximately 5-6 minutes after starting leucovorin patient began c/o nausea and vomiting.  RN stopped leucovorin and started NS at 511m/hr.  RN notified MD and received VO for compazine.  Before RN was able to administer compazine, patient became very diaphoretic with increased nausea and vomiting.  RN notified MD and began IVF to gravity.  VS obtained.  BP 94/54.  O2 at 2L was started due to O2 stats dropping into high 80s, low 90s.  EKG obtained per VO from Dr. FBurr Medicoin infusion.  Within approximately 10 minutes, patient stated she was feeling better.  Compazine not given.  Patient was less diaphoretic and face was not as flushed.  Continued IVF at 9975mhr x 5009m Supplemental O2 discontinued.  Patient was able to ambulate to restroom with no distress or discomfort.  VSS.  BP 117/68, O2 100% on RA.  Per Dr. FenBurr Medicoeucovorin and 5FU pump held.  Patient discharged to family with no s/s or c/o discomfort or distress.

## 2020-10-03 ENCOUNTER — Telehealth: Payer: Self-pay

## 2020-10-03 ENCOUNTER — Other Ambulatory Visit (HOSPITAL_COMMUNITY): Payer: Self-pay

## 2020-10-03 ENCOUNTER — Encounter: Payer: Self-pay | Admitting: Hematology

## 2020-10-03 ENCOUNTER — Telehealth: Payer: Self-pay | Admitting: Pharmacist

## 2020-10-03 DIAGNOSIS — C221 Intrahepatic bile duct carcinoma: Secondary | ICD-10-CM

## 2020-10-03 DIAGNOSIS — C189 Malignant neoplasm of colon, unspecified: Secondary | ICD-10-CM

## 2020-10-03 LAB — GLUCOSE, CAPILLARY: Glucose-Capillary: 139 mg/dL — ABNORMAL HIGH (ref 70–99)

## 2020-10-03 MED ORDER — CAPECITABINE 500 MG PO TABS
1000.0000 mg/m2 | ORAL_TABLET | Freq: Two times a day (BID) | ORAL | 0 refills | Status: DC
  Filled 2020-10-03: qty 112, 14d supply, fill #0
  Filled 2020-10-11: qty 112, 21d supply, fill #0

## 2020-10-03 NOTE — Telephone Encounter (Signed)
This nurse attempted to reach patient related to canceled appointments and recommendations from Dr. Burr Medico.  Left a message for a return call to clinic.

## 2020-10-03 NOTE — Telephone Encounter (Signed)
This nurse spoke with patient and made aware of canceled infusion appointments.  Informed the Dr. Burr Medico is going to change her treatment and it will be discussed more with her on her next appointment with Cira Rue, NP.  Patient acknowledged understanding. No further questions or concerns at this time.

## 2020-10-03 NOTE — Telephone Encounter (Signed)
Oral Oncology Pharmacist Encounter  Received new prescription for Xeloda (capecitabine) for the palliative treatment of stage IV adenocarcinoma of the colon in conjunction with Vectibix (panitumumab), planned duration until disease progression or unacceptable drug toxicity.  Labs from 10/02/20 assessed, magnesium 1.2 - receiving magnesium oxide 800 mg three times daily and magnesium sulfate infusions in clinic as needed. No additional relevant lab abnormalities. Prescription dose and frequency assessed.   Current medication list in Epic reviewed, one relevant DDI with Xeloda (capecitabine) and Nexium (esomeprazole) identified: consider discontinuation of Nexium (esomeprazole) and addition of Pepcid (famotidine) as needed.   Evaluated chart and no patient barriers to medication adherence identified.   Prescription has been e-scribed to the [facility] for benefits analysis and approval.  Oral Oncology Clinic will continue to follow for insurance authorization, copayment issues, initial counseling and start date.  Benn Moulder, PharmD Pharmacy Resident  10/03/2020 8:29 AM

## 2020-10-03 NOTE — Telephone Encounter (Signed)
Oral Oncology Patient Advocate Encounter  After completing a benefits investigation, prior authorization for Xeloda is not required at this time through [facility].  Patient's copay is $0.  [place] Patient [facility] Phone [phone] Fax [phone] 10/03/2020 11:27 AM

## 2020-10-04 ENCOUNTER — Inpatient Hospital Stay: Payer: Medicare (Managed Care)

## 2020-10-08 ENCOUNTER — Telehealth: Payer: Self-pay

## 2020-10-08 ENCOUNTER — Other Ambulatory Visit: Payer: Self-pay | Admitting: Hematology

## 2020-10-08 NOTE — Telephone Encounter (Signed)
This nurse reached out to patient to inform of lab results and recommendations per Dr. Burr Medico.  No answer, left a message to return call to clinic.

## 2020-10-08 NOTE — Telephone Encounter (Signed)
-----   Message from [name], MD sent at 10/08/2020 12:30 PM EDT ----- Caryl Asp,  I just noticed that her Mag was quite low last week (1.2), could you let her know and let her increase oral mag by one tab a day? Please schedule her for iv mag 4g once this week, here or in other office  Thanks   Krista Blue

## 2020-10-09 ENCOUNTER — Encounter (HOSPITAL_COMMUNITY): Payer: Self-pay

## 2020-10-09 ENCOUNTER — Telehealth: Payer: Self-pay | Admitting: Hematology

## 2020-10-09 ENCOUNTER — Other Ambulatory Visit: Payer: Self-pay

## 2020-10-09 ENCOUNTER — Ambulatory Visit (HOSPITAL_COMMUNITY)
Admission: RE | Admit: 2020-10-09 | Discharge: 2020-10-09 | Disposition: A | Discharge status: Home / Self Care | Payer: Medicare (Managed Care) | Source: Ambulatory Visit | Attending: Hematology | Admitting: Hematology

## 2020-10-09 DIAGNOSIS — C221 Intrahepatic bile duct carcinoma: Secondary | ICD-10-CM | POA: Diagnosis present

## 2020-10-09 MED ORDER — HEPARIN SOD (PORK) LOCK FLUSH 100 UNIT/ML IV SOLN
INTRAVENOUS | Status: AC
  Filled 2020-10-09: qty 5

## 2020-10-09 MED ORDER — IOHEXOL 350 MG/ML SOLN
75.0000 mL | Freq: Once | INTRAVENOUS | Status: AC | PRN
  Administered 2020-10-09: 75 mL via INTRAVENOUS

## 2020-10-09 MED ORDER — HEPARIN SOD (PORK) LOCK FLUSH 100 UNIT/ML IV SOLN
500.0000 [IU] | Freq: Once | INTRAVENOUS | Status: AC
  Administered 2020-10-09: 500 [IU] via INTRAVENOUS

## 2020-10-09 NOTE — Telephone Encounter (Signed)
Scheduled follow-up appointment per 8/2 staff message. Patient is aware.

## 2020-10-11 ENCOUNTER — Other Ambulatory Visit (HOSPITAL_COMMUNITY): Payer: Self-pay

## 2020-10-11 NOTE — Telephone Encounter (Signed)
Oral Chemotherapy Pharmacist Encounter  I spoke with patient for overview of: Xeloda (capecitabine) for the treatment of metastatic colon cancer in conjunction with panitumumab, planned duration until disease progression or unacceptable drug toxicity.   Counseled patient on administration, dosing, side effects, monitoring, drug-food interactions, safe handling, storage, and disposal.  Patient will take Xeloda '500mg'$  tablets, 4 tablets ('2000mg'$ ) by mouth in AM and 4 tabs ('2000mg'$ ) by mouth in PM, within 30 minutes of finishing meals, on days 1-14 of each 21 day cycle.   Xeloda start date is pending - patient knows not to start until instructed by MD. Patient has office visit on 8/10  Adverse effects include but are not limited to: fatigue, decreased blood counts, GI upset, diarrhea, mouth sores, and hand-foot syndrome.  Patient has anti-emetic on hand and knows to take it if nausea develops.   Patient will obtain anti diarrheal and alert the office of 4 or more loose stools above baseline.  Reviewed with patient importance of keeping a medication schedule and plan for any missed doses. No barriers to medication adherence identified.  Medication reconciliation performed and medication/allergy list updated. Patient confirmed she is no longer taking Nexium (esomeprazole). Discussed if she does have acid reflux issues while on Xeloda that Pepcid (famotidine) or Tums are OK alternatives to take as needed that do not interact with this medication.   Insurance authorization for Xeloda has been obtained. Patient will pick this up from the [facility] on 10/12/20.  Patient informed the pharmacy will reach out 5-7 days prior to needing next fill of Xeloda to coordinate continued medication acquisition to prevent break in therapy.  All questions answered.  Lindsay [name] voiced understanding and appreciation.   Medication education handout placed in mail for patient. Patient knows to call  the office with questions or concerns. Oral Chemotherapy Clinic phone number provided to patient.   Leron Croak, PharmD, BCPS Hematology/Oncology Clinical Pharmacist [facility] [phone] 10/11/2020 11:47 AM

## 2020-10-12 ENCOUNTER — Other Ambulatory Visit: Payer: Self-pay

## 2020-10-12 ENCOUNTER — Inpatient Hospital Stay: Payer: Medicare (Managed Care) | Attending: Hematology

## 2020-10-12 VITALS — BP 150/101 | HR 78 | Temp 98.5°F | Resp 18

## 2020-10-12 DIAGNOSIS — Z87891 Personal history of nicotine dependence: Secondary | ICD-10-CM | POA: Diagnosis not present

## 2020-10-12 DIAGNOSIS — Z7984 Long term (current) use of oral hypoglycemic drugs: Secondary | ICD-10-CM | POA: Diagnosis not present

## 2020-10-12 DIAGNOSIS — C778 Secondary and unspecified malignant neoplasm of lymph nodes of multiple regions: Secondary | ICD-10-CM | POA: Insufficient documentation

## 2020-10-12 DIAGNOSIS — T451X5D Adverse effect of antineoplastic and immunosuppressive drugs, subsequent encounter: Secondary | ICD-10-CM | POA: Insufficient documentation

## 2020-10-12 DIAGNOSIS — Z86010 Personal history of colonic polyps: Secondary | ICD-10-CM | POA: Diagnosis not present

## 2020-10-12 DIAGNOSIS — C7802 Secondary malignant neoplasm of left lung: Secondary | ICD-10-CM | POA: Diagnosis not present

## 2020-10-12 DIAGNOSIS — E119 Type 2 diabetes mellitus without complications: Secondary | ICD-10-CM | POA: Insufficient documentation

## 2020-10-12 DIAGNOSIS — M1990 Unspecified osteoarthritis, unspecified site: Secondary | ICD-10-CM | POA: Diagnosis not present

## 2020-10-12 DIAGNOSIS — C787 Secondary malignant neoplasm of liver and intrahepatic bile duct: Secondary | ICD-10-CM | POA: Diagnosis present

## 2020-10-12 DIAGNOSIS — R011 Cardiac murmur, unspecified: Secondary | ICD-10-CM | POA: Diagnosis not present

## 2020-10-12 DIAGNOSIS — Z95828 Presence of other vascular implants and grafts: Secondary | ICD-10-CM

## 2020-10-12 DIAGNOSIS — C19 Malignant neoplasm of rectosigmoid junction: Secondary | ICD-10-CM | POA: Diagnosis not present

## 2020-10-12 DIAGNOSIS — I10 Essential (primary) hypertension: Secondary | ICD-10-CM | POA: Diagnosis not present

## 2020-10-12 DIAGNOSIS — G620 Drug-induced polyneuropathy: Secondary | ICD-10-CM | POA: Insufficient documentation

## 2020-10-12 DIAGNOSIS — C221 Intrahepatic bile duct carcinoma: Secondary | ICD-10-CM

## 2020-10-12 MED ORDER — SODIUM CHLORIDE 0.9 % IV SOLN
INTRAVENOUS | Status: DC
  Filled 2020-10-12: qty 250

## 2020-10-12 MED ORDER — MAGNESIUM SULFATE 4 GM/100ML IV SOLN
4.0000 g | Freq: Once | INTRAVENOUS | Status: AC
  Administered 2020-10-12: 4 g via INTRAVENOUS

## 2020-10-12 MED ORDER — HEPARIN SOD (PORK) LOCK FLUSH 100 UNIT/ML IV SOLN
500.0000 [IU] | Freq: Once | INTRAVENOUS | Status: AC
  Administered 2020-10-12: 500 [IU]
  Filled 2020-10-12: qty 5

## 2020-10-16 ENCOUNTER — Inpatient Hospital Stay: Payer: Medicare (Managed Care) | Admitting: Hematology

## 2020-10-16 ENCOUNTER — Ambulatory Visit: Payer: Medicare (Managed Care)

## 2020-10-16 ENCOUNTER — Encounter: Payer: Self-pay | Admitting: Hematology

## 2020-10-16 ENCOUNTER — Inpatient Hospital Stay: Payer: Medicare (Managed Care)

## 2020-10-16 ENCOUNTER — Other Ambulatory Visit: Payer: Self-pay

## 2020-10-16 VITALS — BP 154/86 | HR 67 | Temp 98.3°F | Resp 18 | Ht 66.0 in | Wt 181.8 lb

## 2020-10-16 DIAGNOSIS — C221 Intrahepatic bile duct carcinoma: Secondary | ICD-10-CM

## 2020-10-16 DIAGNOSIS — Z7189 Other specified counseling: Secondary | ICD-10-CM

## 2020-10-16 DIAGNOSIS — Z95828 Presence of other vascular implants and grafts: Secondary | ICD-10-CM

## 2020-10-16 LAB — CBC WITH DIFFERENTIAL (CANCER CENTER ONLY)
Abs Immature Granulocytes: 0.02 10*3/uL (ref 0.00–0.07)
Basophils Absolute: 0 10*3/uL (ref 0.0–0.1)
Basophils Relative: 1 %
Eosinophils Absolute: 0.2 10*3/uL (ref 0.0–0.5)
Eosinophils Relative: 4 %
HCT: 37 % (ref 36.0–46.0)
Hemoglobin: 12.5 g/dL (ref 12.0–15.0)
Immature Granulocytes: 0 %
Lymphocytes Relative: 22 %
Lymphs Abs: 1.1 10*3/uL (ref 0.7–4.0)
MCH: 31.7 pg (ref 26.0–34.0)
MCHC: 33.8 g/dL (ref 30.0–36.0)
MCV: 93.9 fL (ref 80.0–100.0)
Monocytes Absolute: 0.5 10*3/uL (ref 0.1–1.0)
Monocytes Relative: 11 %
Neutro Abs: 3.3 10*3/uL (ref 1.7–7.7)
Neutrophils Relative %: 62 %
Platelet Count: 223 10*3/uL (ref 150–400)
RBC: 3.94 MIL/uL (ref 3.87–5.11)
RDW: 14.3 % (ref 11.5–15.5)
WBC Count: 5.2 10*3/uL (ref 4.0–10.5)
nRBC: 0 % (ref 0.0–0.2)

## 2020-10-16 LAB — CMP (CANCER CENTER ONLY)
ALT: 31 U/L (ref 0–44)
AST: 32 U/L (ref 15–41)
Albumin: 4.1 g/dL (ref 3.5–5.0)
Alkaline Phosphatase: 96 U/L (ref 38–126)
Anion gap: 12 (ref 5–15)
BUN: 12 mg/dL (ref 8–23)
CO2: 25 mmol/L (ref 22–32)
Calcium: 9.9 mg/dL (ref 8.9–10.3)
Chloride: 103 mmol/L (ref 98–111)
Creatinine: 0.64 mg/dL (ref 0.44–1.00)
GFR, Estimated: >60 mL/min (ref >60)
Glucose, Bld: 102 mg/dL — ABNORMAL HIGH (ref 70–99)
Potassium: 3.8 mmol/L (ref 3.5–5.1)
Sodium: 140 mmol/L (ref 135–145)
Total Bilirubin: 0.4 mg/dL (ref 0.3–1.2)
Total Protein: 7.9 g/dL (ref 6.5–8.1)

## 2020-10-16 LAB — MAGNESIUM: Magnesium: 1.3 mg/dL — ABNORMAL LOW (ref 1.7–2.4)

## 2020-10-16 LAB — CEA (IN HOUSE-CHCC): CEA (CHCC-In House): <1 ng/mL (ref 0.00–5.00)

## 2020-10-16 MED ORDER — SODIUM CHLORIDE 0.9% FLUSH
10.0000 mL | Freq: Once | INTRAVENOUS | Status: AC
  Administered 2020-10-16: 10 mL
  Filled 2020-10-16: qty 10

## 2020-10-16 MED ORDER — HEPARIN SOD (PORK) LOCK FLUSH 100 UNIT/ML IV SOLN
500.0000 [IU] | Freq: Once | INTRAVENOUS | Status: AC
  Administered 2020-10-16: 500 [IU]
  Filled 2020-10-16: qty 5

## 2020-10-16 NOTE — Progress Notes (Signed)
[place]   Telephone:([phone] Fax:([phone]) [phone]   Clinic Follow up Note   Patient Care Team: [name], MD as PCP - General (Internal Medicine) [name], RN (Inactive) as Oncology Nurse Navigator [name], MD as Consulting Physician (Oncology) [name], MD as Consulting Physician (Gastroenterology)  Date of Service:  10/16/2020  CHIEF COMPLAINT: Follow-up metastatic colon cancer  SUMMARY OF ONCOLOGIC HISTORY: Oncology History Overview Note  Cancer Staging No matching staging information was found for the patient.    metastatic colon cancer to liver  06/20/2019 Procedure   Colonoscopy by Dr [name] 06/20/19  IMPRESSION -Seven 3 to 10 mm polyps in the sigmoid colon, in the transverse colon and in the escending colon, removed with a cold snare. Resected and retrireved.  -One 49m polyp in the descending colon. Biopsies. Tattoes.  -Mediaum sized lipoma in the ascending colon.   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 See Comment B. Colon, Ascending, polyp, Polypectomy:  -TUBULAR ADENOMA -No high grade dysplasia or malignancy.  C. Colon, TRansverse, Polyo, polectomy:  -TUBULAR ADENOMA -No high grade dysplasia or malignancy.  D. Colon, Sigmoid, Polyp, Polypectomy:  -HYPERPLASTIC POLYP   11/07/2019 Imaging   UKoreaAbdomen 11/07/19  IMPRESSION: 1. Two solid masses in the liver are nonspecific. Recommend MRI abdomen with and without contrast for further evaluation.   12/15/2019 Imaging   MRI Abdomen 12/15/19  IMPRESSION: 1. There are two large masses in the liver with appearance favoring metastatic disease or hepatocellular carcinoma or cholangiocarcinoma. A benign etiology is highly unlikely given the enhancement pattern and associated adenopathy. 2. Considerable porta hepatis and retroperitoneal adenopathy. Some of the confluent porta hepatis tumor is  potentially infiltrative and abuts the pancreatic body along its right upper margin, making it difficult to completely exclude the possibility of pancreatic adenocarcinoma primary. Possibilities helpful in further workup might include tissue diagnosis, endoscopic ultrasound, or nuclear medicine PET-CT. 3. Pancreas divisum. 4. Lumbar spondylosis and degenerative disc disease. 5. Despite efforts by the technologist and patient, motion artifact is present on today's exam and could not be eliminated. This reduces exam sensitivity and specificity.   12/22/2019 Procedure   Upper Endoscopy by Dr [name]10/15/21  IMPRESSION - One lymph node was visualized and measured in the porta hepatis region. Fine needle aspiration performed    12/22/2019 Initial Biopsy   A. LIVER, PORTA HEPATIS MASS, FINE NEEDLE 12/22/19 ASPIRATION:  FINAL MICROSCOPIC DIAGNOSIS:  - Malignant cells consistent with metastatic adenocarcinoma   01/01/2020 Initial Diagnosis   [place]   01/08/2020 Initial Biopsy   FINAL MICROSCOPIC DIAGNOSIS:   A. LIVER, LEFT LOBE, BIOPSY:  - Metastatic adenocarcinoma, consistent with a colorectal primary.  See  comment      COMMENT:   Immunohistochemical stains show the tumor cells are positive for CK20  and CDX2 but negative for CK7, consistent with above interpretation.  Dr. [name] notified on 01/10/2020   01/08/2020 Genetic Testing   [name]  [name] wildtype and [name]/[name] mutations which make her eligible for target biological agent Vectibix.   01/24/2020 Procedure   PAC placed 01/24/20   01/25/2020 -  Chemotherapy   first line FOLFOX starting 01/25/2020, Vextibix  added with C2 (02/06/20)   06/20/2020 Imaging   CT CAP  IMPRESSION: 1. Continued interval reduction in size and conspicuity of a subsolid nodule of the peripheral left upper lobe. 2. Unchanged prominent pretracheal and subcarinal lymph nodes. 3.  Redemonstrated partially  calcified low-attenuation liver masses, slightly decreased in size compared to prior examination. 4. Slight interval decrease in size of a portacaval lymph node or conglomerate and retroperitoneal lymph nodes. 5. Findings are consistent with continued treatment response of nodal, pulmonary, and hepatic metastatic disease. 6. Coronary artery disease.   Aortic Atherosclerosis (ICD10-I70.0).     10/09/2020 Imaging   IMPRESSION: 1. Slight decrease in size of dominant liver mass with smaller liver mass within 1-2 mm of prior measurement. 2. Stable appearance of celiac lymph and retroperitoneal lymph nodes. Dominant node with calcification in the gastrohepatic ligament as described. 3. Continued decrease in size of LEFT upper lobe nodule. 4. Three-vessel coronary artery calcification. 5. Aortic atherosclerosis.      CURRENT THERAPY:  First line FOLFOX starting 01/25/2020, Vextibix added with C2 (02/06/20), oxaliplatin stopped after September 18, 2020 due to infusion reaction.  5-FU infusion changed to Xeloda after October 02, 2020.   INTERVAL HISTORY: Lindsay [name] is here for a follow up. She was last seen by me on October 02, 2020.  Due to vasovagal like symptoms, 5-FU infusion was not given last cycle.  She recovered well and did not have any new symptoms after discharge.  She otherwise doing well, neuropathy has slightly improved, no other new complaints. All other systems were reviewed with the patient and are negative.  MEDICAL HISTORY:  Past Medical History:  Diagnosis Date   Arthritis    Diabetes mellitus without complication ([place])    Hypertension    met colon ca to liver 01/2020    SURGICAL HISTORY: Past Surgical History:  Procedure Laterality Date   ABDOMINAL HYSTERECTOMY     ANTERIOR AND POSTERIOR REPAIR WITH SACROSPINOUS FIXATION N/A 07/16/2016   Procedure: ANTERIOR AND POSTERIOR REPAIR WITH SACROSPINOUS FIXATION;  Surgeon: Everlene Farrier, MD;  Location: [facility];  Service:  Gynecology;  Laterality: N/A;   CYSTOSCOPY  07/16/2016   Procedure: CYSTOSCOPY;  Surgeon: Everlene Farrier, MD;  Location: [facility];  Service: Gynecology;;   ESOPHAGOGASTRODUODENOSCOPY (EGD) WITH PROPOFOL N/A 12/22/2019   Procedure: ESOPHAGOGASTRODUODENOSCOPY (EGD) WITH PROPOFOL;  Surgeon: [name], MD;  Location: [facility];  Service: Endoscopy;  Laterality: N/A;   FINE NEEDLE ASPIRATION N/A 12/22/2019   Procedure: FINE NEEDLE ASPIRATION (FNA) LINEAR;  Surgeon: [name], MD;  Location: [facility];  Service: Endoscopy;  Laterality: N/A;   IR IMAGING GUIDED PORT INSERTION  01/24/2020   LAPAROSCOPIC VAGINAL HYSTERECTOMY WITH SALPINGECTOMY Bilateral 07/16/2016   Procedure: LAPAROSCOPIC ASSISTED VAGINAL HYSTERECTOMY WITH SALPINGECTOMY;  Surgeon: Everlene Farrier, MD;  Location: [facility];  Service: Gynecology;  Laterality: Bilateral;   MYOMECTOMY ABDOMINAL APPROACH     THYROID SURGERY     tyroid     UPPER ESOPHAGEAL ENDOSCOPIC ULTRASOUND (EUS) N/A 12/22/2019   Procedure: UPPER ESOPHAGEAL ENDOSCOPIC ULTRASOUND (EUS);  Surgeon: [name], MD;  Location: [facility];  Service: Endoscopy;  Laterality: N/A;    I have reviewed the social history and family history with the patient and they are unchanged from previous note.  ALLERGIES:  is allergic to oxaliplatin.  MEDICATIONS:  Current Outpatient Medications  Medication Sig Dispense Refill   aspirin EC 81 MG tablet Take 1 tablet (81 mg total) by mouth daily. 90 tablet 3   atorvastatin (LIPITOR) 20 MG tablet Take 20 mg by mouth at bedtime.   1   clindamycin (CLINDAGEL) 1 % gel Apply topically 2 (two) times daily. 60 g 2   clobetasol cream (TEMOVATE) 6.57 % Apply 1 application topically daily.  doxycycline (VIBRA-TABS) 100 MG tablet Take 1 tablet (100 mg total) by mouth 2 (two) times daily. 60 tablet 1   HYDROcodone bit-homatropine (HYCODAN) 5-1.5 MG/5ML syrup hydrocodone-homatropine 5 mg-1.5 mg/5 mL oral syrup     lidocaine-prilocaine  (EMLA) cream Apply 1 application topically as needed. 30 g 1   lisinopril-hydrochlorothiazide (ZESTORETIC) 20-12.5 MG tablet Take 1 tablet by mouth daily with breakfast.      magic mouthwash w/lidocaine SOLN Take 5 mLs by mouth 4 (four) times daily. 475 mL 2   magnesium oxide (MAG-OX) 400 (241.3 Mg) MG tablet Take 1 tablet (400 mg total) by mouth in the morning, at noon, and at bedtime. 90 tablet 2   magnesium oxide (MAG-OX) 400 MG tablet Take 2 tablets (800 mg total) by mouth 3 (three) times daily. 180 tablet 2   ondansetron (ZOFRAN) 8 MG tablet Take 1 tablet (8 mg total) by mouth every 8 (eight) hours as needed for nausea or vomiting. 20 tablet 1   potassium chloride SA (KLOR-CON) 20 MEQ tablet Take 1 tablet (20 mEq total) by mouth 2 (two) times daily. 60 tablet 1   prochlorperazine (COMPAZINE) 10 MG tablet Take 1 tablet (10 mg total) by mouth every 6 (six) hours as needed for nausea or vomiting. 30 tablet 2   spironolactone (ALDACTONE) 25 MG tablet Take 25 mg by mouth daily with breakfast.     traMADol (ULTRAM) 50 MG tablet Take 1 tablet (50 mg total) by mouth every 6 (six) hours as needed. 15 tablet 0   valACYclovir (VALTREX) 1000 MG tablet Take 1 tablet (1,000 mg total) by mouth 2 (two) times daily. 10 tablet 0   capecitabine (XELODA) 500 MG tablet Take 4 tablets (2,000 mg total) by mouth 2 (two) times daily after a meal. Take for 14 days, followed by 7 days off. Repeat every 21 days. (Patient not taking: Reported on 10/16/2020) 112 tablet 0   No current facility-administered medications for this visit.    PHYSICAL EXAMINATION: ECOG PERFORMANCE STATUS: 1 - Symptomatic but completely ambulatory  Vitals:   10/16/20 1010  BP: (!) 154/86  Pulse: 67  Resp: 18  Temp: 98.3 F (36.8 C)  SpO2: 100%    Filed Weights   10/16/20 1010  Weight: 181 lb 12.8 oz (82.5 kg)    GENERAL:alert, no distress and comfortable SKIN: skin color, texture, turgor are normal, no rashes or significant  lesions EYES: normal, Conjunctiva are pink and non-injected, sclera clear  NEURO: alert & oriented x 3 with fluent speech, no focal motor/sensory deficits  LABORATORY DATA:  I have reviewed the data as listed CBC Latest Ref Rng & Units 10/16/2020 10/02/2020 09/18/2020  WBC 4.0 - 10.5 K/uL 5.2 5.7 4.1  Hemoglobin 12.0 - 15.0 g/dL 12.5 12.4 11.8(L)  Hematocrit 36.0 - 46.0 % 37.0 35.6(L) 34.9(L)  Platelets 150 - 400 K/uL 223 213 164     CMP Latest Ref Rng & Units 10/16/2020 10/02/2020 09/18/2020  Glucose 70 - 99 mg/dL 102(H) 104(H) 102(H)  BUN 8 - 23 mg/dL 12 9 10   Creatinine 0.44 - 1.00 mg/dL 0.64 0.71 0.67  Sodium 135 - 145 mmol/L 140 141 137  Potassium 3.5 - 5.1 mmol/L 3.8 4.1 4.9  Chloride 98 - 111 mmol/L 103 105 105  CO2 22 - 32 mmol/L 25 26 25   Calcium 8.9 - 10.3 mg/dL 9.9 9.4 9.7  Total Protein 6.5 - 8.1 g/dL 7.9 7.9 8.0  Total Bilirubin 0.3 - 1.2 mg/dL 0.4 0.3 0.3  Alkaline Phos  38 - 126 U/L 96 86 91  AST 15 - 41 U/L 32 30 25  ALT 0 - 44 U/L 31 27 21       RADIOGRAPHIC STUDIES: I have personally reviewed the radiological images as listed and agreed with the findings in the report. No results found.   ASSESSMENT & PLAN:  Lindsay [name] is a 64 y.o. female with   1. 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 There is a hypermetabolic 1.8 cm pulmonary nodule in left upper lobe, and focal hypermetabolic lesion in the splenic fixture of the colon, concerning for primary tumor. -I started her on first-line FOLFOX q2weeks on 01/25/20. Vectibix was added with C2.  -06/20/20 CT CAP showed good response.  -due to her recent reaction to Oxaliplatin, stopped after cycle 18 -she again had a vasovagal-like reaction in the infusion room after Vectibix infusion, before 5-FU on 08/02/2020, I doubt it is true allergy reaction, not sure  if is related to her hypomagnesemia, EKG was unremarkable  -will change 5-fu to xeloda.  She has received the medicine.  Due to her skin crackling from 5-FU, I will start her Xarelto on 1500 mg twice daily for 14 days for first cycle  -will hold on Vectibix for today and restart after her mag back to normal    2. Mild Neuropathy G1 -S/p C5 she has intermittent mild neuropathy with numbness in her fingertips only. -Oxaliplatin dose reduced from C6 and discontinued after reaction 09/18/20. -overall slightly improved    3. Chronic Lower abdominal pain, Nausea, secondary to #1 -She has had chronic low abdominal pain for 5 years ranging up to 4 times a week. She notes her pain worsened in the last 3 years, but no work-up until 2021.   -For pain she is now on Tramadol for severe pain. She can otherwise she Tylenol -overall improved  4. HTN, DM, Heart Murmur, Arthritis -On medications. Managed by her PCP and Cardiologist Dr Einar Gip   5. 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 -She lives with husband and has brothers and sister in and out of town. Her only child passed in recent years. -She notes her husband is aware of her condition and treatment, but she keeps the information to a minimum as she feels he may not handle it well.    6. Goals of care -she understands the goal is palliative  -She is full code.    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  -she has been on 4g mag iv twoth chemo and 4g on day 3 -continue oral mag to 2 tabs tid     PLAN:  -plan to start Xeloda 1559m bid for 14 days next Monday 8/15 -phone visit on 8/22 -IV mag 4g later this week  -Lab, flush, f/u and Vectibix on 9/1 or 9/2    No problem-specific Assessment & Plan notes found for this encounter.   No orders of the defined types were placed in this encounter.   All questions were answered. The  patient knows to call the clinic with any problems, questions or concerns. No barriers to learning was detected. The total time spent in the appointment was 30 minutes.     YTruitt Merle MD 10/16/2020

## 2020-10-18 ENCOUNTER — Telehealth: Payer: Self-pay | Admitting: Hematology

## 2020-10-18 ENCOUNTER — Inpatient Hospital Stay: Payer: Medicare (Managed Care)

## 2020-10-18 ENCOUNTER — Other Ambulatory Visit: Payer: Self-pay

## 2020-10-18 VITALS — BP 134/84 | HR 72 | Resp 18

## 2020-10-18 DIAGNOSIS — Z95828 Presence of other vascular implants and grafts: Secondary | ICD-10-CM

## 2020-10-18 DIAGNOSIS — C221 Intrahepatic bile duct carcinoma: Secondary | ICD-10-CM

## 2020-10-18 MED ORDER — HEPARIN SOD (PORK) LOCK FLUSH 100 UNIT/ML IV SOLN
250.0000 [IU] | Freq: Once | INTRAVENOUS | Status: AC | PRN
  Administered 2020-10-18: 250 [IU]
  Filled 2020-10-18: qty 5

## 2020-10-18 MED ORDER — MAGNESIUM SULFATE 4 GM/100ML IV SOLN
4.0000 g | Freq: Once | INTRAVENOUS | Status: AC
  Administered 2020-10-18: 4 g via INTRAVENOUS
  Filled 2020-10-18: qty 100

## 2020-10-18 MED ORDER — SODIUM CHLORIDE 0.9% FLUSH
3.0000 mL | Freq: Once | INTRAVENOUS | Status: AC | PRN
  Administered 2020-10-18: 3 mL
  Filled 2020-10-18: qty 10

## 2020-10-18 NOTE — Telephone Encounter (Signed)
Scheduled follow-up appointments per 8/10 los. Patient is aware. 

## 2020-10-24 ENCOUNTER — Encounter: Payer: Self-pay | Admitting: Hematology

## 2020-10-24 ENCOUNTER — Telehealth: Payer: Self-pay | Admitting: Hematology

## 2020-10-24 NOTE — Telephone Encounter (Signed)
Scheduled appt per 8/18 secure chat with RN Threasa Beards. Pt aware.

## 2020-10-26 ENCOUNTER — Other Ambulatory Visit: Payer: Self-pay

## 2020-10-26 ENCOUNTER — Inpatient Hospital Stay: Payer: Medicare (Managed Care)

## 2020-10-26 VITALS — BP 153/93 | HR 67 | Temp 98.0°F | Resp 19

## 2020-10-26 DIAGNOSIS — Z95828 Presence of other vascular implants and grafts: Secondary | ICD-10-CM

## 2020-10-26 DIAGNOSIS — C221 Intrahepatic bile duct carcinoma: Secondary | ICD-10-CM

## 2020-10-26 MED ORDER — SODIUM CHLORIDE 0.9% FLUSH
10.0000 mL | Freq: Once | INTRAVENOUS | Status: AC
  Administered 2020-10-26: 10 mL

## 2020-10-26 MED ORDER — MAGNESIUM SULFATE 4 GM/100ML IV SOLN
4.0000 g | Freq: Once | INTRAVENOUS | Status: AC
  Administered 2020-10-26: 4 g via INTRAVENOUS

## 2020-10-26 MED ORDER — HEPARIN SOD (PORK) LOCK FLUSH 100 UNIT/ML IV SOLN
500.0000 [IU] | Freq: Once | INTRAVENOUS | Status: AC
  Administered 2020-10-26: 500 [IU]

## 2020-10-26 NOTE — Patient Instructions (Signed)
Hypomagnesemia Hypomagnesemia is a condition in which the level of magnesium in the blood is low. Magnesium is a mineral that is found in many foods. It is used in many different processes in the body. Hypomagnesemia can affect every organ in thebody. In severe cases, it can cause life-threatening problems. What are the causes? This condition may be caused by: Not getting enough magnesium in your diet. Malnutrition. Problems with absorbing magnesium from the intestines. Dehydration. Alcohol abuse. Vomiting. Severe or chronic diarrhea. Some medicines, including medicines that make you urinate more (diuretics). Certain diseases, such as kidney disease, diabetes, celiac disease, and overactive thyroid. What are the signs or symptoms? Symptoms of this condition include: Loss of appetite. Nausea and vomiting. Involuntary shaking or trembling of a body part (tremor). Muscle weakness. Tingling in the arms and legs. Sudden tightening of muscles (muscle spasms). Confusion. Psychiatric issues, such as depression, irritability, or psychosis. A feeling of fluttering of the heart. Seizures. These symptoms are more severe if magnesium levels drop suddenly. How is this diagnosed? This condition may be diagnosed based on: Your symptoms and medical history. A physical exam. Blood and urine tests. How is this treated? Treatment depends on the cause and the severity of the condition. It may be treated with: A magnesium supplement. This can be taken in pill form. If the condition is severe, magnesium is usually given through an IV. Changes to your diet. You may be directed to eat foods that have a lot of magnesium, such as green leafy vegetables, peas, beans, and nuts. Stopping any intake of alcohol. Follow these instructions at home:     Make sure that your diet includes foods with magnesium. Foods that have a lot of magnesium in them include: Green leafy vegetables, such as spinach and  broccoli. Beans and peas. Nuts and seeds, such as almonds and sunflower seeds. Whole grains, such as whole grain bread and fortified cereals. Take magnesium supplements if your health care provider tells you to do that. Take them as directed. Take over-the-counter and prescription medicines only as told by your health care provider. Have your magnesium levels monitored as told by your health care provider. When you are active, drink fluids that contain electrolytes. Avoid drinking alcohol. Keep all follow-up visits as told by your health care provider. This is important. Contact a health care provider if: You get worse instead of better. Your symptoms return. Get help right away if you: Develop severe muscle weakness. Have trouble breathing. Feel that your heart is racing. Summary Hypomagnesemia is a condition in which the level of magnesium in the blood is low. Hypomagnesemia can affect every organ in the body. Treatment may include eating more foods that contain magnesium, taking magnesium supplements, and not drinking alcohol. Have your magnesium levels monitored as told by your health care provider. This information is not intended to replace advice given to you by your health care provider. Make sure you discuss any questions you have with your healthcare provider. Document Revised: 07/27/2019 Document Reviewed: 07/27/2019 Elsevier Patient Education  2022 [employer]  

## 2020-10-28 ENCOUNTER — Encounter: Payer: Self-pay | Admitting: Hematology

## 2020-10-28 ENCOUNTER — Inpatient Hospital Stay (HOSPITAL_BASED_OUTPATIENT_CLINIC_OR_DEPARTMENT_OTHER): Payer: Medicare (Managed Care) | Admitting: Hematology

## 2020-10-28 DIAGNOSIS — C189 Malignant neoplasm of colon, unspecified: Secondary | ICD-10-CM

## 2020-10-28 DIAGNOSIS — C221 Intrahepatic bile duct carcinoma: Secondary | ICD-10-CM | POA: Diagnosis not present

## 2020-10-28 MED ORDER — CAPECITABINE 500 MG PO TABS
1000.0000 mg/m2 | ORAL_TABLET | Freq: Two times a day (BID) | ORAL | 0 refills | Status: DC
Start: 2020-10-28 — End: 2020-11-01

## 2020-10-28 NOTE — Progress Notes (Signed)
[facility]   [phone] [phone]   Clinic Follow up Note   Patient Care Team: [name], MD as PCP - General (Internal Medicine) [name], RN (Inactive) as Oncology Nurse Navigator [name], MD as Consulting Physician (Oncology) [name], MD as Consulting Physician (Gastroenterology)  Date of Service:  10/28/2020  I connected with Lindsay [name] on 1/66/0630 at  3:00 PM EDT by telephone visit and verified that I am speaking with the correct person using two identifiers.  I discussed the limitations, risks, security and privacy concerns of performing an evaluation and management service by telephone and the availability of in person appointments. I also discussed with the patient that there may be a patient responsible charge related to this service. The patient expressed understanding and agreed to proceed.   Other persons participating in the visit and their role in the encounter:  none  Patient's location:  home Provider's location:  my office  CHIEF COMPLAINT: f/u of metastatic colon cancer  SUMMARY OF ONCOLOGIC HISTORY: Oncology History Overview Note  Cancer Staging No matching staging information was found for the patient.    metastatic colon cancer to liver  06/20/2019 Procedure   Colonoscopy by Dr [name] 06/20/19  IMPRESSION -Seven 3 to 10 mm polyps in the sigmoid colon, in the transverse colon and in the escending colon, removed with a cold snare. Resected and retrireved.  -One 99m polyp in the descending colon. Biopsies. Tattoes.  -Mediaum sized lipoma in the ascending colon.   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 See Comment B. Colon, Ascending, polyp, Polypectomy:  -TUBULAR ADENOMA -No high grade dysplasia or malignancy.  C. Colon, TRansverse, Polyo, polectomy:  -TUBULAR ADENOMA -No high grade dysplasia or  malignancy.  D. Colon, Sigmoid, Polyp, Polypectomy:  -HYPERPLASTIC POLYP   11/07/2019 Imaging   [place]domen 11/07/19  IMPRESSION: 1. Two solid masses in the liver are nonspecific. Recommend MRI abdomen with and without contrast for further evaluation.   12/15/2019 Imaging   MRI Abdomen 12/15/19  IMPRESSION: 1. There are two large masses in the liver with appearance favoring metastatic disease or hepatocellular carcinoma or cholangiocarcinoma. A benign etiology is highly unlikely given the enhancement pattern and associated adenopathy. 2. Considerable porta hepatis and retroperitoneal adenopathy. Some of the confluent porta hepatis tumor is potentially infiltrative and abuts the pancreatic body along its right upper margin, making it difficult to completely exclude the possibility of pancreatic adenocarcinoma primary. Possibilities helpful in further workup might include tissue diagnosis, endoscopic ultrasound, or nuclear medicine PET-CT. 3. Pancreas divisum. 4. Lumbar spondylosis and degenerative disc disease. 5. Despite efforts by the technologist and patient, motion artifact is present on today's exam and could not be eliminated. This reduces exam sensitivity and specificity.   12/22/2019 Procedure   Upper Endoscopy by Dr [name]10/15/21  IMPRESSION - One lymph node was visualized and measured in the porta hepatis region. Fine needle aspiration performed    12/22/2019 Initial Biopsy   A. LIVER, PORTA HEPATIS MASS, FINE NEEDLE 12/22/19 ASPIRATION:  FINAL MICROSCOPIC DIAGNOSIS:  - Malignant cells consistent with metastatic adenocarcinoma   01/01/2020 Initial Diagnosis   Intrahepatic cholangiocarcinoma ([facility]   01/08/2020 Initial Biopsy   FINAL MICROSCOPIC DIAGNOSIS:   A. LIVER, LEFT LOBE, BIOPSY:  - Metastatic adenocarcinoma, consistent with a colorectal primary.  See  comment      COMMENT:   Immunohistochemical stains show the tumor cells are positive  for CK20  and  CDX2 but negative for CK7, consistent with above interpretation.  Dr. Burr Medico was notified on 01/10/2020   01/08/2020 Genetic Testing   [name]  [name] wildtype and [name]/[name] mutations which make her eligible for target biological agent Vectibix.   01/24/2020 Procedure   PAC placed 01/24/20   01/25/2020 -  Chemotherapy   first line FOLFOX starting 01/25/2020, Vextibix  added with C2 (02/06/20)   06/20/2020 Imaging   CT CAP  IMPRESSION: 1. Continued interval reduction in size and conspicuity of a subsolid nodule of the peripheral left upper lobe. 2. Unchanged prominent pretracheal and subcarinal lymph nodes. 3. Redemonstrated partially calcified low-attenuation liver masses, slightly decreased in size compared to prior examination. 4. Slight interval decrease in size of a portacaval lymph node or conglomerate and retroperitoneal lymph nodes. 5. Findings are consistent with continued treatment response of nodal, pulmonary, and hepatic metastatic disease. 6. Coronary artery disease.   Aortic Atherosclerosis (ICD10-I70.0).     10/09/2020 Imaging   IMPRESSION: 1. Slight decrease in size of dominant liver mass with smaller liver mass within 1-2 mm of prior measurement. 2. Stable appearance of celiac lymph and retroperitoneal lymph nodes. Dominant node with calcification in the gastrohepatic ligament as described. 3. Continued decrease in size of LEFT upper lobe nodule. 4. Three-vessel coronary artery calcification. 5. Aortic atherosclerosis.      CURRENT THERAPY:  First line FOLFOX starting 01/25/2020, Vextibix added with C2 (02/06/20), oxaliplatin stopped after September 18, 2020 due to infusion reaction.  5-FU infusion changed to Xeloda after October 02, 2020.   INTERVAL HISTORY:  Lindsay [name] was contacted for a follow up of metastatic colon cancer. She was last seen by me on 10/16/20.  She reports she is doing well on the Xeloda, with no fatigue or other side effects.   All  other systems were reviewed with the patient and are negative.  MEDICAL HISTORY:  Past Medical History:  Diagnosis Date   Arthritis    Diabetes mellitus without complication ([place])    Hypertension    met colon ca to liver 01/2020    SURGICAL HISTORY: Past Surgical History:  Procedure Laterality Date   ABDOMINAL HYSTERECTOMY     ANTERIOR AND POSTERIOR REPAIR WITH SACROSPINOUS FIXATION N/A 07/16/2016   Procedure: ANTERIOR AND POSTERIOR REPAIR WITH SACROSPINOUS FIXATION;  Surgeon: Everlene Farrier, MD;  Location: [facility];  Service: Gynecology;  Laterality: N/A;   CYSTOSCOPY  07/16/2016   Procedure: CYSTOSCOPY;  Surgeon: Everlene Farrier, MD;  Location: [facility];  Service: Gynecology;;   ESOPHAGOGASTRODUODENOSCOPY (EGD) WITH PROPOFOL N/A 12/22/2019   Procedure: ESOPHAGOGASTRODUODENOSCOPY (EGD) WITH PROPOFOL;  Surgeon: [name], MD;  Location: [facility];  Service: Endoscopy;  Laterality: N/A;   FINE NEEDLE ASPIRATION N/A 12/22/2019   Procedure: FINE NEEDLE ASPIRATION (FNA) LINEAR;  Surgeon: [name], MD;  Location: [facility];  Service: Endoscopy;  Laterality: N/A;   IR IMAGING GUIDED PORT INSERTION  01/24/2020   LAPAROSCOPIC VAGINAL HYSTERECTOMY WITH SALPINGECTOMY Bilateral 07/16/2016   Procedure: LAPAROSCOPIC ASSISTED VAGINAL HYSTERECTOMY WITH SALPINGECTOMY;  Surgeon: Everlene Farrier, MD;  Location: [facility];  Service: Gynecology;  Laterality: Bilateral;   MYOMECTOMY ABDOMINAL APPROACH     THYROID SURGERY     tyroid     UPPER ESOPHAGEAL ENDOSCOPIC ULTRASOUND (EUS) N/A 12/22/2019   Procedure: UPPER ESOPHAGEAL ENDOSCOPIC ULTRASOUND (EUS);  Surgeon: [name], MD;  Location: [facility];  Service: Endoscopy;  Laterality: N/A;    I have reviewed the social history and family history with  the patient and they are unchanged from previous note.  ALLERGIES:  is allergic to oxaliplatin.  MEDICATIONS:  Current Outpatient Medications  Medication Sig Dispense Refill   aspirin EC 81 MG  tablet Take 1 tablet (81 mg total) by mouth daily. 90 tablet 3   atorvastatin (LIPITOR) 20 MG tablet Take 20 mg by mouth at bedtime.   1   capecitabine (XELODA) 500 MG tablet Take 4 tablets (2,000 mg total) by mouth 2 (two) times daily after a meal. Take for 14 days, followed by 7 days off. Repeat every 21 days. 112 tablet 0   clindamycin (CLINDAGEL) 1 % gel Apply topically 2 (two) times daily. 60 g 2   clobetasol cream (TEMOVATE) 2.77 % Apply 1 application topically daily.     doxycycline (VIBRA-TABS) 100 MG tablet Take 1 tablet (100 mg total) by mouth 2 (two) times daily. 60 tablet 1   HYDROcodone bit-homatropine (HYCODAN) 5-1.5 MG/5ML syrup hydrocodone-homatropine 5 mg-1.5 mg/5 mL oral syrup     lidocaine-prilocaine (EMLA) cream Apply 1 application topically as needed. 30 g 1   lisinopril-hydrochlorothiazide (ZESTORETIC) 20-12.5 MG tablet Take 1 tablet by mouth daily with breakfast.      magic mouthwash w/lidocaine SOLN Take 5 mLs by mouth 4 (four) times daily. 475 mL 2   magnesium oxide (MAG-OX) 400 (241.3 Mg) MG tablet Take 1 tablet (400 mg total) by mouth in the morning, at noon, and at bedtime. 90 tablet 2   magnesium oxide (MAG-OX) 400 MG tablet Take 2 tablets (800 mg total) by mouth 3 (three) times daily. 180 tablet 2   ondansetron (ZOFRAN) 8 MG tablet Take 1 tablet (8 mg total) by mouth every 8 (eight) hours as needed for nausea or vomiting. 20 tablet 1   potassium chloride SA (KLOR-CON) 20 MEQ tablet Take 1 tablet (20 mEq total) by mouth 2 (two) times daily. 60 tablet 1   prochlorperazine (COMPAZINE) 10 MG tablet Take 1 tablet (10 mg total) by mouth every 6 (six) hours as needed for nausea or vomiting. 30 tablet 2   spironolactone (ALDACTONE) 25 MG tablet Take 25 mg by mouth daily with breakfast.     traMADol (ULTRAM) 50 MG tablet Take 1 tablet (50 mg total) by mouth every 6 (six) hours as needed. 15 tablet 0   valACYclovir (VALTREX) 1000 MG tablet Take 1 tablet (1,000 mg total) by mouth  2 (two) times daily. 10 tablet 0   No current facility-administered medications for this visit.    PHYSICAL EXAMINATION: ECOG PERFORMANCE STATUS: 1 - Symptomatic but completely ambulatory  There were no vitals filed for this visit. Wt Readings from Last 3 Encounters:  10/16/20 181 lb 12.8 oz (82.5 kg)  10/02/20 180 lb 6.4 oz (81.8 kg)  09/18/20 181 lb (82.1 kg)     No vitals taken today, Exam not performed today  LABORATORY DATA:  I have reviewed the data as listed CBC Latest Ref Rng & Units 10/16/2020 10/02/2020 09/18/2020  WBC 4.0 - 10.5 K/uL 5.2 5.7 4.1  Hemoglobin 12.0 - 15.0 g/dL 12.5 12.4 11.8(L)  Hematocrit 36.0 - 46.0 % 37.0 35.6(L) 34.9(L)  Platelets 150 - 400 K/uL 223 213 164     CMP Latest Ref Rng & Units 10/16/2020 10/02/2020 09/18/2020  Glucose 70 - 99 mg/dL 102(H) 104(H) 102(H)  BUN 8 - 23 mg/dL 12 9 10   Creatinine 0.44 - 1.00 mg/dL 0.64 0.71 0.67  Sodium 135 - 145 mmol/L 140 141 137  Potassium 3.5 - 5.1 mmol/L  3.8 4.1 4.9  Chloride 98 - 111 mmol/L 103 105 105  CO2 22 - 32 mmol/L 25 26 25   Calcium 8.9 - 10.3 mg/dL 9.9 9.4 9.7  Total Protein 6.5 - 8.1 g/dL 7.9 7.9 8.0  Total Bilirubin 0.3 - 1.2 mg/dL 0.4 0.3 0.3  Alkaline Phos 38 - 126 U/L 96 86 91  AST 15 - 41 U/L 32 30 25  ALT 0 - 44 U/L 31 27 21       RADIOGRAPHIC STUDIES: I have personally reviewed the radiological images as listed and agreed with the findings in the report. No results found.   ASSESSMENT & PLAN:  Lindsay [name] is a 65 y.o. female with   1. 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 There is a hypermetabolic 1.8 cm pulmonary nodule in left upper lobe, and focal hypermetabolic lesion in the splenic fixture of the colon, concerning for primary tumor. -I started her on first-line FOLFOX q2weeks on 01/25/20. Vectibix was added with  C2.  -06/20/20 CT CAP showed good response.  -due to her recent reaction to Oxaliplatin, stopped after cycle 18 -she again had a vasovagal-like reaction in the infusion room after Vectibix infusion, before 5-FU on 08/02/2020, I doubt it is true allergy reaction, not sure if is related to her hypomagnesemia, EKG was unremarkable  -will change 5-fu to xeloda.  She has received the medicine.  Due to her skin crackling from 5-FU, I switched her to Xarelto on 1500 mg twice daily for 14 days for first cycle, she is tolerating well now -we will plan to increase to full dose (2000 mg BID) with cycle 2, I refilled today. -labs, f/u, and vectibix on 11/08/20   2. Mild Neuropathy G1 -S/p C5 she has intermittent mild neuropathy with numbness in her fingertips only. -Oxaliplatin dose reduced from C6 and discontinued after reaction 09/18/20. -overall slightly improved    3. Chronic Lower abdominal pain, Nausea, secondary to #1 -She has had chronic low abdominal pain for 5 years ranging up to 4 times a week. She notes her pain worsened in the last 3 years, but no work-up until 2021.   -For pain she is now on Tramadol for severe pain. She can otherwise she Tylenol -overall improved   4. HTN, DM, Heart Murmur, Arthritis -On medications. Managed by her PCP and Cardiologist Dr Einar Gip   5. 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 -She lives with husband and has brothers and sister in and out of town. Her only child passed in recent years. -She notes her husband is aware of her condition and treatment, but she keeps the information to a minimum as she feels he may not handle it well.    6. Goals of care -she understands the goal is palliative  -She is full code.    7. 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  -she has been on 4g mag iv twoth chemo and 4g on day 3 -continue oral mag to 2 tabs tid       PLAN:  -continue Xeloda 1558m bid for a total of 14 days, then off for 7 days  -Lab, flush, f/u and Vectibix on 9/2     No problem-specific Assessment & Plan notes found for this encounter.   No  orders of the defined types were placed in this encounter.  All questions were answered. The patient knows to call the clinic with any problems, questions or concerns. No barriers to learning was detected. The total time spent in the appointment was 12 minutes.     [name], MD 10/28/2020   I, Wilburn Mylar, am acting as scribe for [name], MD.   I have reviewed the above documentation for accuracy and completeness, and I agree with the above.

## 2020-10-29 ENCOUNTER — Other Ambulatory Visit (HOSPITAL_COMMUNITY): Payer: Self-pay

## 2020-10-29 ENCOUNTER — Other Ambulatory Visit: Payer: Self-pay | Admitting: Hematology

## 2020-10-29 DIAGNOSIS — C189 Malignant neoplasm of colon, unspecified: Secondary | ICD-10-CM

## 2020-10-30 ENCOUNTER — Other Ambulatory Visit: Payer: Self-pay | Admitting: Hematology

## 2020-10-30 ENCOUNTER — Other Ambulatory Visit (HOSPITAL_COMMUNITY): Payer: Self-pay

## 2020-10-30 DIAGNOSIS — C189 Malignant neoplasm of colon, unspecified: Secondary | ICD-10-CM

## 2020-10-31 ENCOUNTER — Other Ambulatory Visit (HOSPITAL_COMMUNITY): Payer: Self-pay

## 2020-10-31 ENCOUNTER — Other Ambulatory Visit: Payer: Self-pay | Admitting: Hematology

## 2020-10-31 DIAGNOSIS — C189 Malignant neoplasm of colon, unspecified: Secondary | ICD-10-CM

## 2020-10-31 NOTE — Telephone Encounter (Signed)
Refilled on 8/22. Gardiner Rhyme, RN

## 2020-11-01 ENCOUNTER — Other Ambulatory Visit (HOSPITAL_COMMUNITY): Payer: Self-pay

## 2020-11-01 ENCOUNTER — Other Ambulatory Visit: Payer: Self-pay | Admitting: Hematology

## 2020-11-01 ENCOUNTER — Other Ambulatory Visit: Payer: Self-pay | Admitting: Pharmacist

## 2020-11-01 DIAGNOSIS — C189 Malignant neoplasm of colon, unspecified: Secondary | ICD-10-CM

## 2020-11-01 MED ORDER — CAPECITABINE 500 MG PO TABS
1000.0000 mg/m2 | ORAL_TABLET | Freq: Two times a day (BID) | ORAL | 0 refills | Status: DC
  Filled 2020-11-01: qty 112, 21d supply, fill #0
  Filled 2020-11-25: qty 112, 14d supply, fill #0

## 2020-11-01 NOTE — Progress Notes (Signed)
Oral Oncology Pharmacist Encounter  Prescription refill for Xeloda (capecitabine) sent to [facility] in error. Prescription redirected to [facility] for dispensing.  Leron Croak, PharmD, BCPS Hematology/Oncology Clinical Pharmacist [facility] [phone] 11/01/2020 9:53 AM

## 2020-11-06 ENCOUNTER — Other Ambulatory Visit (HOSPITAL_COMMUNITY): Payer: Self-pay

## 2020-11-08 ENCOUNTER — Other Ambulatory Visit: Payer: Self-pay

## 2020-11-08 ENCOUNTER — Inpatient Hospital Stay: Payer: Medicare (Managed Care) | Attending: Hematology

## 2020-11-08 ENCOUNTER — Inpatient Hospital Stay: Payer: Medicare (Managed Care) | Admitting: Hematology

## 2020-11-08 ENCOUNTER — Inpatient Hospital Stay: Payer: Medicare (Managed Care)

## 2020-11-08 VITALS — BP 128/84 | HR 66 | Temp 98.0°F | Resp 17 | Wt 182.3 lb

## 2020-11-08 DIAGNOSIS — E119 Type 2 diabetes mellitus without complications: Secondary | ICD-10-CM | POA: Diagnosis not present

## 2020-11-08 DIAGNOSIS — T451X5D Adverse effect of antineoplastic and immunosuppressive drugs, subsequent encounter: Secondary | ICD-10-CM | POA: Diagnosis not present

## 2020-11-08 DIAGNOSIS — Z5112 Encounter for antineoplastic immunotherapy: Secondary | ICD-10-CM | POA: Insufficient documentation

## 2020-11-08 DIAGNOSIS — Z87891 Personal history of nicotine dependence: Secondary | ICD-10-CM | POA: Diagnosis not present

## 2020-11-08 DIAGNOSIS — C221 Intrahepatic bile duct carcinoma: Secondary | ICD-10-CM | POA: Diagnosis not present

## 2020-11-08 DIAGNOSIS — Z7189 Other specified counseling: Secondary | ICD-10-CM

## 2020-11-08 DIAGNOSIS — R011 Cardiac murmur, unspecified: Secondary | ICD-10-CM | POA: Insufficient documentation

## 2020-11-08 DIAGNOSIS — C7802 Secondary malignant neoplasm of left lung: Secondary | ICD-10-CM | POA: Diagnosis not present

## 2020-11-08 DIAGNOSIS — C787 Secondary malignant neoplasm of liver and intrahepatic bile duct: Secondary | ICD-10-CM | POA: Diagnosis present

## 2020-11-08 DIAGNOSIS — I10 Essential (primary) hypertension: Secondary | ICD-10-CM | POA: Diagnosis not present

## 2020-11-08 DIAGNOSIS — G620 Drug-induced polyneuropathy: Secondary | ICD-10-CM | POA: Insufficient documentation

## 2020-11-08 DIAGNOSIS — Z7984 Long term (current) use of oral hypoglycemic drugs: Secondary | ICD-10-CM | POA: Diagnosis not present

## 2020-11-08 DIAGNOSIS — M1990 Unspecified osteoarthritis, unspecified site: Secondary | ICD-10-CM | POA: Diagnosis not present

## 2020-11-08 DIAGNOSIS — C19 Malignant neoplasm of rectosigmoid junction: Secondary | ICD-10-CM | POA: Diagnosis not present

## 2020-11-08 DIAGNOSIS — Z86010 Personal history of colonic polyps: Secondary | ICD-10-CM | POA: Insufficient documentation

## 2020-11-08 DIAGNOSIS — Z95828 Presence of other vascular implants and grafts: Secondary | ICD-10-CM

## 2020-11-08 DIAGNOSIS — C778 Secondary and unspecified malignant neoplasm of lymph nodes of multiple regions: Secondary | ICD-10-CM | POA: Diagnosis not present

## 2020-11-08 LAB — CBC WITH DIFFERENTIAL (CANCER CENTER ONLY)
Abs Immature Granulocytes: 0.01 10*3/uL (ref 0.00–0.07)
Basophils Absolute: 0 10*3/uL (ref 0.0–0.1)
Basophils Relative: 0 %
Eosinophils Absolute: 0.1 10*3/uL (ref 0.0–0.5)
Eosinophils Relative: 2 %
HCT: 35.2 % — ABNORMAL LOW (ref 36.0–46.0)
Hemoglobin: 12.3 g/dL (ref 12.0–15.0)
Immature Granulocytes: 0 %
Lymphocytes Relative: 24 %
Lymphs Abs: 1.4 10*3/uL (ref 0.7–4.0)
MCH: 32.4 pg (ref 26.0–34.0)
MCHC: 34.9 g/dL (ref 30.0–36.0)
MCV: 92.6 fL (ref 80.0–100.0)
Monocytes Absolute: 0.5 10*3/uL (ref 0.1–1.0)
Monocytes Relative: 9 %
Neutro Abs: 3.7 10*3/uL (ref 1.7–7.7)
Neutrophils Relative %: 65 %
Platelet Count: 202 10*3/uL (ref 150–400)
RBC: 3.8 MIL/uL — ABNORMAL LOW (ref 3.87–5.11)
RDW: 15.1 % (ref 11.5–15.5)
WBC Count: 5.7 10*3/uL (ref 4.0–10.5)
nRBC: 0 % (ref 0.0–0.2)

## 2020-11-08 LAB — CMP (CANCER CENTER ONLY)
ALT: 33 U/L (ref 0–44)
AST: 35 U/L (ref 15–41)
Albumin: 4.3 g/dL (ref 3.5–5.0)
Alkaline Phosphatase: 87 U/L (ref 38–126)
Anion gap: 12 (ref 5–15)
BUN: 20 mg/dL (ref 8–23)
CO2: 25 mmol/L (ref 22–32)
Calcium: 9.5 mg/dL (ref 8.9–10.3)
Chloride: 100 mmol/L (ref 98–111)
Creatinine: 0.74 mg/dL (ref 0.44–1.00)
GFR, Estimated: >60 mL/min (ref >60)
Glucose, Bld: 97 mg/dL (ref 70–99)
Potassium: 4.2 mmol/L (ref 3.5–5.1)
Sodium: 137 mmol/L (ref 135–145)
Total Bilirubin: 0.4 mg/dL (ref 0.3–1.2)
Total Protein: 7.8 g/dL (ref 6.5–8.1)

## 2020-11-08 LAB — MAGNESIUM: Magnesium: 1.7 mg/dL (ref 1.7–2.4)

## 2020-11-08 MED ORDER — HEPARIN SOD (PORK) LOCK FLUSH 100 UNIT/ML IV SOLN
500.0000 [IU] | Freq: Once | INTRAVENOUS | Status: AC | PRN
  Administered 2020-11-08: 500 [IU]

## 2020-11-08 MED ORDER — SODIUM CHLORIDE 0.9 % IV SOLN: Freq: Once | INTRAVENOUS | Status: AC

## 2020-11-08 MED ORDER — MAGNESIUM SULFATE 4 GM/100ML IV SOLN
4.0000 g | Freq: Once | INTRAVENOUS | Status: DC
  Filled 2020-11-08: qty 100

## 2020-11-08 MED ORDER — MAGNESIUM SULFATE 2 GM/50ML IV SOLN
2.0000 g | Freq: Once | INTRAVENOUS | Status: AC
  Administered 2020-11-08: 2 g via INTRAVENOUS
  Filled 2020-11-08: qty 50

## 2020-11-08 MED ORDER — SODIUM CHLORIDE 0.9% FLUSH
10.0000 mL | INTRAVENOUS | Status: DC | PRN
  Administered 2020-11-08: 10 mL

## 2020-11-08 MED ORDER — SODIUM CHLORIDE 0.9% FLUSH
10.0000 mL | Freq: Once | INTRAVENOUS | Status: AC
  Administered 2020-11-08: 10 mL

## 2020-11-08 MED ORDER — SODIUM CHLORIDE 0.9 % IV SOLN
6.0000 mg/kg | Freq: Once | INTRAVENOUS | Status: AC
  Administered 2020-11-08: 500 mg via INTRAVENOUS
  Filled 2020-11-08: qty 20

## 2020-11-08 NOTE — Patient Instructions (Signed)
[facility]  Discharge Instructions: Thank you for choosing [facility] to provide your oncology and hematology care.   If you have a lab appointment with the [facility], please go directly to the [facility] and check in at the registration area.   Wear comfortable clothing and clothing appropriate for easy access to any Portacath or PICC line.   We strive to give you quality time with your provider. You may need to reschedule your appointment if you arrive late (15 or more minutes).  Arriving late affects you and other patients whose appointments are after yours.  Also, if you miss three or more appointments without notifying the office, you may be dismissed from the clinic at the provider's discretion.      For prescription refill requests, have your pharmacy contact our office and allow 72 hours for refills to be completed.    Today you received the following chemotherapy and/or immunotherapy agents Vectibix      To help prevent nausea and vomiting after your treatment, we encourage you to take your nausea medication as directed.  BELOW ARE SYMPTOMS THAT SHOULD BE REPORTED IMMEDIATELY: *FEVER GREATER THAN 100.4 F (38 C) OR HIGHER *CHILLS OR SWEATING *NAUSEA AND VOMITING THAT IS NOT CONTROLLED WITH YOUR NAUSEA MEDICATION *UNUSUAL SHORTNESS OF BREATH *UNUSUAL BRUISING OR BLEEDING *URINARY PROBLEMS (pain or burning when urinating, or frequent urination) *BOWEL PROBLEMS (unusual diarrhea, constipation, pain near the anus) TENDERNESS IN MOUTH AND THROAT WITH OR WITHOUT PRESENCE OF ULCERS (sore throat, sores in mouth, or a toothache) UNUSUAL RASH, SWELLING OR PAIN  UNUSUAL VAGINAL DISCHARGE OR ITCHING   Items with * indicate a potential emergency and should be followed up as soon as possible or go to the Emergency Department if any problems should occur.  Please show the CHEMOTHERAPY ALERT CARD or IMMUNOTHERAPY ALERT CARD at check-in to  the Emergency Department and triage nurse.  Should you have questions after your visit or need to cancel or reschedule your appointment, please contact [facility]  Dept: [phone]  and follow the prompts.  Office hours are 8:00 a.m. to 4:30 p.m. Monday - Friday. Please note that voicemails left after 4:00 p.m. may not be returned until the following business day.  We are closed weekends and major holidays. You have access to a nurse at all times for urgent questions. Please call the main number to the clinic Dept: [phone] and follow the prompts.   For any non-urgent questions, you may also contact your provider using MyChart. We now offer e-Visits for anyone 75 and older to request care online for non-urgent symptoms. For details visit [email or web address].   Also download the MyChart app! Go to the app store, search "MyChart", open the app, select [facility], and log in with your MyChart username and password.  Due to Covid, a mask is required upon entering the hospital/clinic. If you do not have a mask, one will be given to you upon arrival. For doctor visits, patients may have 1 support person aged 33 or older with them. For treatment visits, patients cannot have anyone with them due to current Covid guidelines and our immunocompromised population.   Panitumumab Solution for Injection What is this medication? PANITUMUMAB (pan i TOOM ue mab) is a monoclonal antibody. It is used to treat colorectal cancer. This medicine may be used for other purposes; ask your health care provider or pharmacist if you have questions. COMMON BRAND NAME(S): Vectibix What  should I tell my care team before I take this medication? They need to know if you have any of these conditions: eye disease, vision problems low levels of calcium, magnesium, or potassium in the blood lung or breathing disease, like asthma skin conditions or sensitivity an unusual or allergic reaction  to panitumumab, other medicines, foods, dyes, or preservatives pregnant or trying to get pregnant breast-feeding How should I use this medication? This drug is given as an infusion into a vein. It is administered in a hospital or clinic by a specially trained health care professional. Talk to your pediatrician regarding the use of this medicine in children. Special care may be needed. Overdosage: If you think you have taken too much of this medicine contact a poison control center or emergency room at once. NOTE: This medicine is only for you. Do not share this medicine with others. What if I miss a dose? It is important not to miss your dose. Call your doctor or health care professional if you are unable to keep an appointment. What may interact with this medication? Do not take this medicine with any of the following medications: bevacizumab This list may not describe all possible interactions. Give your health care provider a list of all the medicines, herbs, non-prescription drugs, or dietary supplements you use. Also tell them if you smoke, drink alcohol, or use illegal drugs. Some items may interact with your medicine. What should I watch for while using this medication? Visit your doctor for checks on your progress. This drug may make you feel generally unwell. This is not uncommon, as chemotherapy can affect healthy cells as well as cancer cells. Report any side effects. Continue your course of treatment even though you feel ill unless your doctor tells you to stop. This medicine can make you more sensitive to the sun. Keep out of the sun while receiving this medicine and for 2 months after the last dose. If you cannot avoid being in the sun, wear protective clothing and use sunscreen. Do not use sun lamps or tanning beds/booths. In some cases, you may be given additional medicines to help with side effects. Follow all directions for their use. Call your doctor or health care professional  for advice if you get a fever, chills or sore throat, or other symptoms of a cold or flu. Do not treat yourself. This drug decreases your body's ability to fight infections. Try to avoid being around people who are sick. Avoid taking products that contain aspirin, acetaminophen, ibuprofen, naproxen, or ketoprofen unless instructed by your doctor. These medicines may hide a fever. Do not become pregnant while taking this medicine and for 2 months after the last dose. Women should inform their doctor if they wish to become pregnant or think they might be pregnant. There is a potential for serious side effects to an unborn child. Talk to your health care professional or pharmacist for more information. Do not breast-feed an infant while taking this medicine or for 2 months after the last dose. What side effects may I notice from receiving this medication? Side effects that you should report to your doctor or health care professional as soon as possible: allergic reactions like skin rash, itching or hives, swelling of the face, lips, or tongue breathing problems changes in vision eye pain fast, irregular heartbeat fever, chills mouth sores red spots on the skin redness, blistering, peeling or loosening of the skin, including inside the mouth signs and symptoms of kidney injury like trouble passing  urine or change in the amount of urine signs and symptoms of low blood pressure like dizziness; feeling faint or lightheaded, falls; unusually weak or tired signs of low calcium like fast heartbeat, muscle cramps or muscle pain; pain, tingling, numbness in the hands or feet; seizures signs and symptoms of low magnesium like muscle cramps, pain, or weakness; tremors; seizures; or fast, irregular heartbeat signs and symptoms of low potassium like muscle cramps or muscle pain; chest pain; dizziness; feeling faint or lightheaded, falls; palpitations; breathing problems; or fast, irregular heartbeat swelling of  the ankles, feet, hands Side effects that usually do not require medical attention (report to your doctor or health care professional if they continue or are bothersome): changes in skin like acne, cracks, skin dryness diarrhea eyelash growth headache mouth sores nail changes nausea, vomiting This list may not describe all possible side effects. Call your doctor for medical advice about side effects. You may report side effects to FDA at 1-800-FDA-1088. Where should I keep my medication? This drug is given in a hospital or clinic and will not be stored at home. NOTE: This sheet is a summary. It may not cover all possible information. If you have questions about this medicine, talk to your doctor, pharmacist, or health care provider.  2022 Elsevier/Gold Standard (2015-09-13 16:45:04)  Hypomagnesemia Hypomagnesemia is a condition in which the level of magnesium in the blood is low. Magnesium is a mineral that is found in many foods. It is used in many different processes in the body. Hypomagnesemia can affect every organ in the body. In severe cases, it can cause life-threatening problems. What are the causes? This condition may be caused by: Not getting enough magnesium in your diet. Malnutrition. Problems with absorbing magnesium from the intestines. Dehydration. Alcohol abuse. Vomiting. Severe or chronic diarrhea. Some medicines, including medicines that make you urinate more (diuretics). Certain diseases, such as kidney disease, diabetes, celiac disease, and overactive thyroid. What are the signs or symptoms? Symptoms of this condition include: Loss of appetite. Nausea and vomiting. Involuntary shaking or trembling of a body part (tremor). Muscle weakness. Tingling in the arms and legs. Sudden tightening of muscles (muscle spasms). Confusion. Psychiatric issues, such as depression, irritability, or psychosis. A feeling of fluttering of the heart. Seizures. These symptoms  are more severe if magnesium levels drop suddenly. How is this diagnosed? This condition may be diagnosed based on: Your symptoms and medical history. A physical exam. Blood and urine tests. How is this treated? Treatment depends on the cause and the severity of the condition. It may be treated with: A magnesium supplement. This can be taken in pill form. If the condition is severe, magnesium is usually given through an IV. Changes to your diet. You may be directed to eat foods that have a lot of magnesium, such as green leafy vegetables, peas, beans, and nuts. Stopping any intake of alcohol. Follow these instructions at home:   Make sure that your diet includes foods with magnesium. Foods that have a lot of magnesium in them include: Green leafy vegetables, such as spinach and broccoli. Beans and peas. Nuts and seeds, such as almonds and sunflower seeds. Whole grains, such as whole grain bread and fortified cereals. Take magnesium supplements if your health care provider tells you to do that. Take them as directed. Take over-the-counter and prescription medicines only as told by your health care provider. Have your magnesium levels monitored as told by your health care provider. When you are active, drink fluids  that contain electrolytes. Avoid drinking alcohol. Keep all follow-up visits as told by your health care provider. This is important. Contact a health care provider if: You get worse instead of better. Your symptoms return. Get help right away if you: Develop severe muscle weakness. Have trouble breathing. Feel that your heart is racing. Summary Hypomagnesemia is a condition in which the level of magnesium in the blood is low. Hypomagnesemia can affect every organ in the body. Treatment may include eating more foods that contain magnesium, taking magnesium supplements, and not drinking alcohol. Have your magnesium levels monitored as told by your health care  provider. This information is not intended to replace advice given to you by your health care provider. Make sure you discuss any questions you have with your health care provider. Document Revised: 05/08/2020 Document Reviewed: 07/27/2019 Elsevier Patient Education  [place].

## 2020-11-08 NOTE — Progress Notes (Signed)
[place]   [phone] [phone]) [phone]   Clinic Follow up Note   Patient Care Team: [name], MD as PCP - General (Internal Medicine) [name], RN (Inactive) as Oncology Nurse Navigator [name], MD as Consulting Physician (Oncology) [name], MD as Consulting Physician (Gastroenterology)  Date of Service:  11/08/2020  CHIEF COMPLAINT: f/u of metastatic colon cancer  CURRENT THERAPY:  First line FOLFOX starting 01/25/2020, Vextibix added with C2 (02/06/20), oxaliplatin stopped after September 18, 2020 due to infusion reaction.  5-FU infusion changed to Xeloda after October 02, 2020. first cycle she took 1581m bid for 14 days, dose increased from cycle 2   ASSESSMENT & PLAN:  Lindsay SALMINENis a 65y.o. female with   1. 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 There is a hypermetabolic 1.8 cm pulmonary nodule in left upper lobe, and focal hypermetabolic lesion in the splenic fixture of the colon, concerning for primary tumor. -I started her on first-line FOLFOX q2weeks on 01/25/20. Vectibix was added with C2.  -06/20/20 CT CAP showed good response.  -due to her recent reaction to Oxaliplatin, stopped after cycle 18 -she again had a vasovagal-like reaction in the infusion room after Vectibix infusion, before 5-FU on 08/02/20. No issue noted 10/02/20. -Due to her skin crackling and infusion reaction from 5-FU, I switched her to Xarelto on 1500 mg twice daily for 14 days for first cycle, she is tolerating well now -we will increase her Xeloda dose to 15098min AM and 200025mn PM with cycle 2 (starting 11/11/20) -she will proceed with vectibix today and continue every 3 weeks, hopefully she has less severe hypomagnesemia  -labs and f/u in 3 weeks   2. Mild Neuropathy G1 -S/p C5 she has  intermittent mild neuropathy with numbness in her fingertips only. -Oxaliplatin dose reduced from C6 and discontinued after reaction 09/18/20. -overall slightly improved    3. Chronic Lower abdominal pain, Nausea, secondary to #1 -She has had chronic low abdominal pain for 5 years ranging up to 4 times a week. She notes her pain worsened in the last 3 years, but no work-up until 2021.   -For pain she is now on Tramadol for severe pain. She can otherwise she Tylenol -overall improved   4. HTN, DM, Heart Murmur, Arthritis -On medications. Managed by her PCP and Cardiologist Dr GanEinar Gip5. Financial and Social Support -She has SigTherapist, sportshe is retired. -She lives with husband and has brothers and sister in and out of town. Her only child passed in recent years. -She notes her husband is aware of her condition and treatment, but she keeps the information to a minimum as she feels he may not handle it well.    6. Goals of care -she understands the goal is palliative  -She is full code.    7. Hypomagnesia secondary to Vectibix  -she has been on 4g mag iv with chemo and 4g on day 3 -continue oral mag 2 tabs tid      PLAN:  -proceed with Vectibix today (give 2g mag iv given normal mag) -restart Xeloda on 11/11/20, at 1500m29m tablets) in AM and 2000mg68mtablets) in PM -labs and f/u in 3 weeks    No problem-specific Assessment & Plan notes found for this encounter.   SUMMARY OF ONCOLOGIC  HISTORY: Oncology History Overview Note  Cancer Staging No matching staging information was found for the patient.    metastatic colon cancer to liver  06/20/2019 Procedure   Colonoscopy by Dr [name] 06/20/19  IMPRESSION -Seven 3 to 10 mm polyps in the sigmoid colon, in the transverse colon and in the escending colon, removed with a cold snare. Resected and retrireved.  -One 76m polyp in the descending colon. Biopsies. Tattoes.  -Mediaum sized lipoma in the ascending colon.   FINAL  DIAGNOSIS:  A.Colon, Descending, Polyp, Polectomy:  -FRAGMENTS OF TUBULAR ADENOMA WITH DIFFUCE HIGH GRADE DYSPLASIA. See Comment B. Colon, Ascending, polyp, Polypectomy:  -TUBULAR ADENOMA -No high grade dysplasia or malignancy.  C. Colon, TRansverse, Polyo, polectomy:  -TUBULAR ADENOMA -No high grade dysplasia or malignancy.  D. Colon, Sigmoid, Polyp, Polypectomy:  -HYPERPLASTIC POLYP   11/07/2019 Imaging   [place] 11/07/19  IMPRESSION: 1. Two solid masses in the liver are nonspecific. Recommend MRI abdomen with and without contrast for further evaluation.   12/15/2019 Imaging   MRI Abdomen 12/15/19  IMPRESSION: 1. There are two large masses in the liver with appearance favoring metastatic disease or hepatocellular carcinoma or cholangiocarcinoma. A benign etiology is highly unlikely given the enhancement pattern and associated adenopathy. 2. Considerable porta hepatis and retroperitoneal adenopathy. Some of the confluent porta hepatis tumor is potentially infiltrative and abuts the pancreatic body along its right upper margin, making it difficult to completely exclude the possibility of pancreatic adenocarcinoma primary. Possibilities helpful in further workup might include tissue diagnosis, endoscopic ultrasound, or nuclear medicine PET-CT. 3. Pancreas divisum. 4. Lumbar spondylosis and degenerative disc disease. 5. Despite efforts by the technologist and patient, motion artifact is present on today's exam and could not be eliminated. This reduces exam sensitivity and specificity.   12/22/2019 Procedure   Upper Endoscopy by Dr [name]10/15/21  IMPRESSION - One lymph node was visualized and measured in the porta hepatis region. Fine needle aspiration performed    12/22/2019 Initial Biopsy   A. LIVER, PORTA HEPATIS MASS, FINE NEEDLE 12/22/19 ASPIRATION:  FINAL MICROSCOPIC DIAGNOSIS:  - Malignant cells consistent with metastatic adenocarcinoma   01/01/2020 Initial  Diagnosis   Intrahepatic cholangiocarcinoma ([place]   01/08/2020 Initial Biopsy   FINAL MICROSCOPIC DIAGNOSIS:   A. LIVER, LEFT LOBE, BIOPSY:  - Metastatic adenocarcinoma, consistent with a colorectal primary.  See  comment      COMMENT:   Immunohistochemical stains show the tumor cells are positive for CK20  and CDX2 but negative for CK7, consistent with above interpretation.  Dr. [name] notified on 01/10/2020   01/08/2020 Genetic Testing   [name]  [name] wildtype and [name]/[name] mutations which make her eligible for target biological agent Vectibix.   01/24/2020 Procedure   PAC placed 01/24/20   01/25/2020 -  Chemotherapy   first line FOLFOX starting 01/25/2020, Vextibix  added with C2 (02/06/20)   06/20/2020 Imaging   CT CAP  IMPRESSION: 1. Continued interval reduction in size and conspicuity of a subsolid nodule of the peripheral left upper lobe. 2. Unchanged prominent pretracheal and subcarinal lymph nodes. 3. Redemonstrated partially calcified low-attenuation liver masses, slightly decreased in size compared to prior examination. 4. Slight interval decrease in size of a portacaval lymph node or conglomerate and retroperitoneal lymph nodes. 5. Findings are consistent with continued treatment response of nodal, pulmonary, and hepatic metastatic disease. 6. Coronary artery disease.   Aortic Atherosclerosis (ICD10-I70.0).     10/09/2020 Imaging   IMPRESSION: 1. Slight decrease in size  of dominant liver mass with smaller liver mass within 1-2 mm of prior measurement. 2. Stable appearance of celiac lymph and retroperitoneal lymph nodes. Dominant node with calcification in the gastrohepatic ligament as described. 3. Continued decrease in size of LEFT upper lobe nodule. 4. Three-vessel coronary artery calcification. 5. Aortic atherosclerosis.      INTERVAL HISTORY:  Lindsay [name] is here for a follow up of metastatic colon cancer. She was last seen by me on  10/16/20, with telephone f/u on 10/28/20. She presents to the clinic alone. She reports feeling well overall. She notes tolerating Xeloda well.   All other systems were reviewed with the patient and are negative.  MEDICAL HISTORY:  Past Medical History:  Diagnosis Date   Arthritis    Diabetes mellitus without complication ([place])    Hypertension    met colon ca to liver 01/2020    SURGICAL HISTORY: Past Surgical History:  Procedure Laterality Date   ABDOMINAL HYSTERECTOMY     ANTERIOR AND POSTERIOR REPAIR WITH SACROSPINOUS FIXATION N/A 07/16/2016   Procedure: ANTERIOR AND POSTERIOR REPAIR WITH SACROSPINOUS FIXATION;  Surgeon: Everlene Farrier, MD;  Location: [facility];  Service: Gynecology;  Laterality: N/A;   CYSTOSCOPY  07/16/2016   Procedure: CYSTOSCOPY;  Surgeon: Everlene Farrier, MD;  Location: [facility];  Service: Gynecology;;   ESOPHAGOGASTRODUODENOSCOPY (EGD) WITH PROPOFOL N/A 12/22/2019   Procedure: ESOPHAGOGASTRODUODENOSCOPY (EGD) WITH PROPOFOL;  Surgeon: [name], MD;  Location: [facility];  Service: Endoscopy;  Laterality: N/A;   FINE NEEDLE ASPIRATION N/A 12/22/2019   Procedure: FINE NEEDLE ASPIRATION (FNA) LINEAR;  Surgeon: [name], MD;  Location: [facility];  Service: Endoscopy;  Laterality: N/A;   IR IMAGING GUIDED PORT INSERTION  01/24/2020   LAPAROSCOPIC VAGINAL HYSTERECTOMY WITH SALPINGECTOMY Bilateral 07/16/2016   Procedure: LAPAROSCOPIC ASSISTED VAGINAL HYSTERECTOMY WITH SALPINGECTOMY;  Surgeon: Everlene Farrier, MD;  Location: [facility];  Service: Gynecology;  Laterality: Bilateral;   MYOMECTOMY ABDOMINAL APPROACH     THYROID SURGERY     tyroid     UPPER ESOPHAGEAL ENDOSCOPIC ULTRASOUND (EUS) N/A 12/22/2019   Procedure: UPPER ESOPHAGEAL ENDOSCOPIC ULTRASOUND (EUS);  Surgeon: [name], MD;  Location: [facility];  Service: Endoscopy;  Laterality: N/A;    I have reviewed the social history and family history with the patient and they are unchanged from previous  note.  ALLERGIES:  is allergic to oxaliplatin.  MEDICATIONS:  Current Outpatient Medications  Medication Sig Dispense Refill   aspirin EC 81 MG tablet Take 1 tablet (81 mg total) by mouth daily. 90 tablet 3   atorvastatin (LIPITOR) 20 MG tablet Take 20 mg by mouth at bedtime.   1   capecitabine (XELODA) 500 MG tablet Take 4 tablets (2,000 mg total) by mouth 2 (two) times daily after a meal. Take for 14 days, followed by 7 days off. Repeat every 21 days. 112 tablet 0   clindamycin (CLINDAGEL) 1 % gel Apply topically 2 (two) times daily. 60 g 2   clobetasol cream (TEMOVATE) 7.98 % Apply 1 application topically daily.     doxycycline (VIBRA-TABS) 100 MG tablet Take 1 tablet (100 mg total) by mouth 2 (two) times daily. 60 tablet 1   lidocaine-prilocaine (EMLA) cream Apply 1 application topically as needed. 30 g 1   lisinopril-hydrochlorothiazide (ZESTORETIC) 20-12.5 MG tablet Take 1 tablet by mouth daily with breakfast.      magic mouthwash w/lidocaine SOLN Take 5 mLs by mouth 4 (four) times daily. 475 mL 2   magnesium oxide (MAG-OX) 400 (  241.3 Mg) MG tablet Take 1 tablet (400 mg total) by mouth in the morning, at noon, and at bedtime. 90 tablet 2   magnesium oxide (MAG-OX) 400 MG tablet Take 2 tablets (800 mg total) by mouth 3 (three) times daily. 180 tablet 2   ondansetron (ZOFRAN) 8 MG tablet Take 1 tablet (8 mg total) by mouth every 8 (eight) hours as needed for nausea or vomiting. 20 tablet 1   prochlorperazine (COMPAZINE) 10 MG tablet Take 1 tablet (10 mg total) by mouth every 6 (six) hours as needed for nausea or vomiting. 30 tablet 2   spironolactone (ALDACTONE) 25 MG tablet Take 25 mg by mouth daily with breakfast.     valACYclovir (VALTREX) 1000 MG tablet Take 1 tablet (1,000 mg total) by mouth 2 (two) times daily. 10 tablet 0   No current facility-administered medications for this visit.   Facility-Administered Medications Ordered in Other Visits  Medication Dose Route Frequency  Provider Last Rate Last Admin   heparin lock flush 100 unit/mL  500 Units Intracatheter Once PRN [name], MD       sodium chloride flush (NS) 0.9 % injection 10 mL  10 mL Intracatheter PRN [name], MD        PHYSICAL EXAMINATION: ECOG PERFORMANCE STATUS: 1 - Symptomatic but completely ambulatory  Vitals:   11/08/20 1445  BP: 128/84  Pulse: 66  Resp: 17  Temp: 98 F (36.7 C)  SpO2: 98%   Wt Readings from Last 3 Encounters:  11/08/20 182 lb 4.8 oz (82.7 kg)  10/16/20 181 lb 12.8 oz (82.5 kg)  10/02/20 180 lb 6.4 oz (81.8 kg)     GENERAL:alert, no distress and comfortable SKIN: skin color normal, no rashes or significant lesions EYES: normal, Conjunctiva are pink and non-injected, sclera clear  NEURO: alert & oriented x 3 with fluent speech  LABORATORY DATA:  I have reviewed the data as listed CBC Latest Ref Rng & Units 11/08/2020 10/16/2020 10/02/2020  WBC 4.0 - 10.5 K/uL 5.7 5.2 5.7  Hemoglobin 12.0 - 15.0 g/dL 12.3 12.5 12.4  Hematocrit 36.0 - 46.0 % 35.2(L) 37.0 35.6(L)  Platelets 150 - 400 K/uL 202 223 213     CMP Latest Ref Rng & Units 11/08/2020 10/16/2020 10/02/2020  Glucose 70 - 99 mg/dL 97 102(H) 104(H)  BUN 8 - 23 mg/dL _0 Creatinine 0.44 - 1.00 mg/dL 0.74 0.64 0.71  Sodium 135 - 145 mmol/L 137 140 141  Potassium 3.5 - 5.1 mmol/L 4.2 3.8 4.1  Chloride 98 - 111 mmol/L 100 103 105  CO2 22 - 32 mmol/L _1 Calcium 8.9 - 10.3 mg/dL 9.5 9.9 9.4  Total Protein 6.5 - 8.1 g/dL 7.8 7.9 7.9  Total Bilirubin 0.3 - 1.2 mg/dL 0.4 0.4 0.3  Alkaline Phos 38 - 126 U/L 87 96 86  AST 15 - 41 U/L 35 32 30  ALT 0 - 44 U/L 33 31 27      RADIOGRAPHIC STUDIES: I have personally reviewed the radiological images as listed and agreed with the findings in the report. No results found.    No orders of the defined types were placed in this encounter.  All questions were answered. The patient knows to call the clinic with any problems, questions or concerns. No  barriers to learning was detected. The total time spent in the appointment was 30 minutes.     [name], MD 11/08/2020   I, Wilburn Mylar, am acting as  scribe for [name], MD.   I have reviewed the above documentation for accuracy and completeness, and I agree with the above.

## 2020-11-13 ENCOUNTER — Telehealth: Payer: Self-pay | Admitting: Hematology

## 2020-11-13 NOTE — Telephone Encounter (Signed)
Scheduled follow-up appointment per 9/2 los. Patient is aware. 

## 2020-11-25 ENCOUNTER — Other Ambulatory Visit (HOSPITAL_COMMUNITY): Payer: Self-pay

## 2020-11-29 ENCOUNTER — Inpatient Hospital Stay: Payer: Medicare (Managed Care)

## 2020-11-29 ENCOUNTER — Other Ambulatory Visit (HOSPITAL_COMMUNITY): Payer: Self-pay

## 2020-11-29 ENCOUNTER — Other Ambulatory Visit: Payer: Self-pay

## 2020-11-29 ENCOUNTER — Inpatient Hospital Stay: Payer: Medicare (Managed Care) | Admitting: Hematology

## 2020-11-29 VITALS — BP 138/89 | HR 75 | Temp 98.3°F | Resp 18 | Ht 66.0 in | Wt 184.4 lb

## 2020-11-29 DIAGNOSIS — C221 Intrahepatic bile duct carcinoma: Secondary | ICD-10-CM

## 2020-11-29 DIAGNOSIS — Z7189 Other specified counseling: Secondary | ICD-10-CM

## 2020-11-29 DIAGNOSIS — Z5112 Encounter for antineoplastic immunotherapy: Secondary | ICD-10-CM | POA: Diagnosis not present

## 2020-11-29 DIAGNOSIS — Z95828 Presence of other vascular implants and grafts: Secondary | ICD-10-CM

## 2020-11-29 DIAGNOSIS — C189 Malignant neoplasm of colon, unspecified: Secondary | ICD-10-CM

## 2020-11-29 LAB — CBC WITH DIFFERENTIAL (CANCER CENTER ONLY)
Abs Immature Granulocytes: 0.02 10*3/uL (ref 0.00–0.07)
Basophils Absolute: 0 10*3/uL (ref 0.0–0.1)
Basophils Relative: 0 %
Eosinophils Absolute: 0.1 10*3/uL (ref 0.0–0.5)
Eosinophils Relative: 3 %
HCT: 34.6 % — ABNORMAL LOW (ref 36.0–46.0)
Hemoglobin: 12.1 g/dL (ref 12.0–15.0)
Immature Granulocytes: 0 %
Lymphocytes Relative: 20 %
Lymphs Abs: 1 10*3/uL (ref 0.7–4.0)
MCH: 32.6 pg (ref 26.0–34.0)
MCHC: 35 g/dL (ref 30.0–36.0)
MCV: 93.3 fL (ref 80.0–100.0)
Monocytes Absolute: 0.6 10*3/uL (ref 0.1–1.0)
Monocytes Relative: 12 %
Neutro Abs: 3.2 10*3/uL (ref 1.7–7.7)
Neutrophils Relative %: 65 %
Platelet Count: 202 10*3/uL (ref 150–400)
RBC: 3.71 MIL/uL — ABNORMAL LOW (ref 3.87–5.11)
RDW: 15.9 % — ABNORMAL HIGH (ref 11.5–15.5)
WBC Count: 5 10*3/uL (ref 4.0–10.5)
nRBC: 0 % (ref 0.0–0.2)

## 2020-11-29 LAB — CMP (CANCER CENTER ONLY)
ALT: 30 U/L (ref 0–44)
AST: 33 U/L (ref 15–41)
Albumin: 4.2 g/dL (ref 3.5–5.0)
Alkaline Phosphatase: 94 U/L (ref 38–126)
Anion gap: 10 (ref 5–15)
BUN: 23 mg/dL (ref 8–23)
CO2: 27 mmol/L (ref 22–32)
Calcium: 10.4 mg/dL — ABNORMAL HIGH (ref 8.9–10.3)
Chloride: 99 mmol/L (ref 98–111)
Creatinine: 0.83 mg/dL (ref 0.44–1.00)
GFR, Estimated: >60 mL/min (ref >60)
Glucose, Bld: 118 mg/dL — ABNORMAL HIGH (ref 70–99)
Potassium: 4.1 mmol/L (ref 3.5–5.1)
Sodium: 136 mmol/L (ref 135–145)
Total Bilirubin: 0.4 mg/dL (ref 0.3–1.2)
Total Protein: 8 g/dL (ref 6.5–8.1)

## 2020-11-29 LAB — MAGNESIUM: Magnesium: 1.5 mg/dL — ABNORMAL LOW (ref 1.7–2.4)

## 2020-11-29 LAB — CEA (IN HOUSE-CHCC): CEA (CHCC-In House): <1 ng/mL (ref 0.00–5.00)

## 2020-11-29 MED ORDER — SODIUM CHLORIDE 0.9 % IV SOLN
6.0000 mg/kg | Freq: Once | INTRAVENOUS | Status: AC
  Administered 2020-11-29: 500 mg via INTRAVENOUS
  Filled 2020-11-29: qty 20

## 2020-11-29 MED ORDER — CAPECITABINE 500 MG PO TABS
1000.0000 mg/m2 | ORAL_TABLET | Freq: Two times a day (BID) | ORAL | 0 refills | Status: DC
Start: 2020-11-29 — End: 2020-12-20
  Filled 2020-12-16: qty 112, 21d supply, fill #0

## 2020-11-29 MED ORDER — HEPARIN SOD (PORK) LOCK FLUSH 100 UNIT/ML IV SOLN
500.0000 [IU] | Freq: Once | INTRAVENOUS | Status: AC | PRN
  Administered 2020-11-29: 500 [IU]

## 2020-11-29 MED ORDER — SODIUM CHLORIDE 0.9 % IV SOLN: Freq: Once | INTRAVENOUS | Status: AC

## 2020-11-29 MED ORDER — MAGNESIUM SULFATE 4 GM/100ML IV SOLN
4.0000 g | Freq: Once | INTRAVENOUS | Status: AC
  Administered 2020-11-29: 4 g via INTRAVENOUS
  Filled 2020-11-29: qty 100

## 2020-11-29 MED ORDER — SODIUM CHLORIDE 0.9% FLUSH
10.0000 mL | Freq: Once | INTRAVENOUS | Status: AC
  Administered 2020-11-29: 10 mL

## 2020-11-29 MED ORDER — SODIUM CHLORIDE 0.9% FLUSH
10.0000 mL | INTRAVENOUS | Status: DC | PRN
  Administered 2020-11-29: 10 mL

## 2020-11-29 NOTE — Progress Notes (Signed)
OK to treat today despite low magnesium & pt will get replacement per Dr Burr Medico.

## 2020-11-29 NOTE — Progress Notes (Signed)
[facility]   [phone] [phone]   Clinic Follow up Note   Patient Care Team: [name], MD as PCP - General (Internal Medicine) [name], RN (Inactive) as Oncology Nurse Navigator [name], MD as Consulting Physician (Oncology) [name], MD as Consulting Physician (Gastroenterology)  Date of Service:  11/29/2020  CHIEF COMPLAINT: f/u of metastatic colon cancer  CURRENT THERAPY:  First line FOLFOX starting 01/25/2020, Vextibix added with C2 (02/06/20), oxaliplatin stopped after September 18, 2020 due to infusion reaction.  5-FU infusion changed to Xeloda after October 02, 2020. first cycle she took $Remove'1500mg'jVJTZKO$  bid for 14 days, dose increased from cycle 2 and to full dose from cycle 3  ASSESSMENT & PLAN:  Lindsay [name] is a 65 y.o. female with   1. 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 There is a hypermetabolic 1.8 cm pulmonary nodule in left upper lobe, and focal hypermetabolic lesion in the splenic fixture of the colon, concerning for primary tumor. -I started her on first-line FOLFOX q2weeks on 01/25/20. Vectibix was added with C2.  -06/20/20 CT CAP showed good response.  -due to her recent reaction to Oxaliplatin, stopped after cycle 18 -she again had a vasovagal-like reaction in the infusion room after Vectibix infusion, before 5-FU on 08/02/20. No issue noted 10/02/20. -Due to her skin crackling and infusion reaction from 5-FU, I switched her to Xarelto on 1500 mg BID for 14 days for first cycle. She has slowly increased her Xeloda and will begin full dose with cycle 3. -she has had no further issues with Vectibix infusions. She has begun to develop an ulcer on her hip; I will refill her Magic Mouthwash. -labs reviewed overall adequate to proceed with Vectibix today. Due to low  mag, will give iv mag 4g today  -labs and f/u in 3 weeks   2. Mild Neuropathy G1 -S/p C5 she has intermittent mild neuropathy with numbness in her fingertips only. -Oxaliplatin dose reduced from C6 and discontinued after reaction 09/18/20. -overall slightly improved    3. Chronic Lower abdominal pain, Nausea, secondary to #1 -She has had chronic low abdominal pain for 5 years ranging up to 4 times a week. She notes her pain worsened in the last 3 years, but no work-up until 2021.   -For pain she is now on Tramadol for severe pain. She can otherwise she Tylenol -overall improved   4. HTN, DM, Heart Murmur, Arthritis -On medications. Managed by her PCP and Cardiologist Dr Einar Gip   5. 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 -She lives with husband and has brothers and sister in and out of town. Her only child passed in recent years. -She notes her husband is aware of her condition and treatment, but she keeps the information to a minimum as she feels he may not handle it well.    6. Goals of care -she understands the goal is palliative  -She is full code.    7. Hypomagnesia secondary to Vectibix  -she has been on 4g mag iv with chemo and 4g on day 3 -continue oral mag 2 tabs tid      PLAN:  -proceed with Vectibix today with IV mag 4g  -I refilled her magic mouthwash -restart Xeloda on 12/02/20, at $RemoveBef'2000mg'KmMoOcCocS$  (4 tablets) BID for 14 days  -labs and  f/u in 3 weeks   No problem-specific Assessment & Plan notes found for this encounter.   SUMMARY OF ONCOLOGIC HISTORY: Oncology History Overview Note  Cancer Staging No matching staging information was found for the patient.    metastatic colon cancer to liver  06/20/2019 Procedure   Colonoscopy by Dr [name] 06/20/19  IMPRESSION -Seven 3 to 10 mm polyps in the sigmoid colon, in the transverse colon and in the escending colon, removed with a cold snare. Resected and retrireved.  -One 63mm polyp in the  descending colon. Biopsies. Tattoes.  -Mediaum sized lipoma in the ascending colon.   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 See Comment B. Colon, Ascending, polyp, Polypectomy:  -TUBULAR ADENOMA -No high grade dysplasia or malignancy.  C. Colon, TRansverse, Polyo, polectomy:  -TUBULAR ADENOMA -No high grade dysplasia or malignancy.  D. Colon, Sigmoid, Polyp, Polypectomy:  -HYPERPLASTIC POLYP   11/07/2019 Imaging   US Abdomen 11/07/19  IMPRESSION: 1. Two solid masses in the liver are nonspecific. Recommend MRI abdomen with and without contrast for further evaluation.   12/15/2019 Imaging   MRI Abdomen 12/15/19  IMPRESSION: 1. There are two large masses in the liver with appearance favoring metastatic disease or hepatocellular carcinoma or cholangiocarcinoma. A benign etiology is highly unlikely given the enhancement pattern and associated adenopathy. 2. Considerable porta hepatis and retroperitoneal adenopathy. Some of the confluent porta hepatis tumor is potentially infiltrative and abuts the pancreatic body along its right upper margin, making it difficult to completely exclude the possibility of pancreatic adenocarcinoma primary. Possibilities helpful in further workup might include tissue diagnosis, endoscopic ultrasound, or nuclear medicine PET-CT. 3. Pancreas divisum. 4. Lumbar spondylosis and degenerative disc disease. 5. Despite efforts by the technologist and patient, motion artifact is present on today's exam and could not be eliminated. This reduces exam sensitivity and specificity.   12/22/2019 Procedure   Upper Endoscopy by Dr Benson Norway 12/22/19  IMPRESSION - One lymph node was visualized and measured in the porta hepatis region. Fine needle aspiration performed    12/22/2019 Initial Biopsy   A. LIVER, PORTA HEPATIS MASS, FINE NEEDLE 12/22/19 ASPIRATION:  FINAL MICROSCOPIC  DIAGNOSIS:  - Malignant cells consistent with metastatic adenocarcinoma   01/01/2020 Initial Diagnosis   Intrahepatic cholangiocarcinoma ([place])   01/08/2020 Initial Biopsy   FINAL MICROSCOPIC DIAGNOSIS:   A. LIVER, LEFT LOBE, BIOPSY:  - Metastatic adenocarcinoma, consistent with a colorectal primary.  See  comment      COMMENT:   Immunohistochemical stains show the tumor cells are positive for CK20  and CDX2 but negative for CK7, consistent with above interpretation.  Dr. Burr Medico was notified on 01/10/2020   01/08/2020 Genetic Testing   [name]  [name] wildtype and [name]/[name] mutations which make her eligible for target biological agent Vectibix.   01/24/2020 Procedure   PAC placed 01/24/20   01/25/2020 -  Chemotherapy   first line FOLFOX starting 01/25/2020, Vextibix  added with C2 (02/06/20)   06/20/2020 Imaging   CT CAP  IMPRESSION: 1. Continued interval reduction in size and conspicuity of a subsolid nodule of the peripheral left upper lobe. 2. Unchanged prominent pretracheal and subcarinal lymph nodes. 3. Redemonstrated partially calcified low-attenuation liver masses, slightly decreased in size compared to prior examination. 4. Slight interval decrease in size of a portacaval lymph node or conglomerate and retroperitoneal lymph nodes. 5. Findings are consistent with continued treatment response of nodal, pulmonary, and hepatic metastatic disease. 6. Coronary  artery disease.   Aortic Atherosclerosis (ICD10-I70.0).     10/09/2020 Imaging   IMPRESSION: 1. Slight decrease in size of dominant liver mass with smaller liver mass within 1-2 mm of prior measurement. 2. Stable appearance of celiac lymph and retroperitoneal lymph nodes. Dominant node with calcification in the gastrohepatic ligament as described. 3. Continued decrease in size of LEFT upper lobe nodule. 4. Three-vessel coronary artery calcification. 5. Aortic atherosclerosis.      INTERVAL HISTORY:   Lindsay [name] is here for a follow up of metastatic colon cancer. She was last seen by me on 11/08/20. She presents to the clinic alone. She notes she tolerated her last Vectibix infusion well. She notes she began to develop an ulcer on her lip. She notes sometimes her urine is a light green. I advised her to drink more water.   All other systems were reviewed with the patient and are negative.  MEDICAL HISTORY:  Past Medical History:  Diagnosis Date   Arthritis    Diabetes mellitus without complication ([place])    Hypertension    met colon ca to liver 01/2020    SURGICAL HISTORY: Past Surgical History:  Procedure Laterality Date   ABDOMINAL HYSTERECTOMY     ANTERIOR AND POSTERIOR REPAIR WITH SACROSPINOUS FIXATION N/A 07/16/2016   Procedure: ANTERIOR AND POSTERIOR REPAIR WITH SACROSPINOUS FIXATION;  Surgeon: Everlene Farrier, MD;  Location: [facility];  Service: Gynecology;  Laterality: N/A;   CYSTOSCOPY  07/16/2016   Procedure: CYSTOSCOPY;  Surgeon: Everlene Farrier, MD;  Location: [facility];  Service: Gynecology;;   ESOPHAGOGASTRODUODENOSCOPY (EGD) WITH PROPOFOL N/A 12/22/2019   Procedure: ESOPHAGOGASTRODUODENOSCOPY (EGD) WITH PROPOFOL;  Surgeon: [name], MD;  Location: [facility];  Service: Endoscopy;  Laterality: N/A;   FINE NEEDLE ASPIRATION N/A 12/22/2019   Procedure: FINE NEEDLE ASPIRATION (FNA) LINEAR;  Surgeon: [name], MD;  Location: [facility];  Service: Endoscopy;  Laterality: N/A;   IR IMAGING GUIDED PORT INSERTION  01/24/2020   LAPAROSCOPIC VAGINAL HYSTERECTOMY WITH SALPINGECTOMY Bilateral 07/16/2016   Procedure: LAPAROSCOPIC ASSISTED VAGINAL HYSTERECTOMY WITH SALPINGECTOMY;  Surgeon: Everlene Farrier, MD;  Location: [facility];  Service: Gynecology;  Laterality: Bilateral;   MYOMECTOMY ABDOMINAL APPROACH     THYROID SURGERY     tyroid     UPPER ESOPHAGEAL ENDOSCOPIC ULTRASOUND (EUS) N/A 12/22/2019   Procedure: UPPER ESOPHAGEAL ENDOSCOPIC ULTRASOUND (EUS);  Surgeon: [name], MD;  Location: [facility];  Service: Endoscopy;  Laterality: N/A;    I have reviewed the social history and family history with the patient and they are unchanged from previous note.  ALLERGIES:  is allergic to oxaliplatin.  MEDICATIONS:  Current Outpatient Medications  Medication Sig Dispense Refill   aspirin EC 81 MG tablet Take 1 tablet (81 mg total) by mouth daily. 90 tablet 3   atorvastatin (LIPITOR) 20 MG tablet Take 20 mg by mouth at bedtime.   1   capecitabine (XELODA) 500 MG tablet Take 4 tablets (2,000 mg total) by mouth 2 (two) times daily after a meal. Take for 14 days, followed by 7 days off. Repeat every 21 days. 112 tablet 0   clindamycin (CLINDAGEL) 1 % gel Apply topically 2 (two) times daily. 60 g 2   clobetasol cream (TEMOVATE) 8.85 % Apply 1 application topically daily.     doxycycline (VIBRA-TABS) 100 MG tablet Take 1 tablet (100 mg total) by mouth 2 (two) times daily. 60 tablet 1   lidocaine-prilocaine (EMLA) cream Apply 1 application topically as needed. 30 g 1  lisinopril-hydrochlorothiazide (ZESTORETIC) 20-12.5 MG tablet Take 1 tablet by mouth daily with breakfast.      magic mouthwash w/lidocaine SOLN Take 5 mLs by mouth 4 (four) times daily. 475 mL 2   magnesium oxide (MAG-OX) 400 (241.3 Mg) MG tablet Take 1 tablet (400 mg total) by mouth in the morning, at noon, and at bedtime. 90 tablet 2   magnesium oxide (MAG-OX) 400 MG tablet Take 2 tablets (800 mg total) by mouth 3 (three) times daily. 180 tablet 2   ondansetron (ZOFRAN) 8 MG tablet Take 1 tablet (8 mg total) by mouth every 8 (eight) hours as needed for nausea or vomiting. 20 tablet 1   prochlorperazine (COMPAZINE) 10 MG tablet Take 1 tablet (10 mg total) by mouth every 6 (six) hours as needed for nausea or vomiting. 30 tablet 2   spironolactone (ALDACTONE) 25 MG tablet Take 25 mg by mouth daily with breakfast.     valACYclovir (VALTREX) 1000 MG tablet Take 1 tablet (1,000 mg total) by mouth 2 (two)  times daily. 10 tablet 0   No current facility-administered medications for this visit.   Facility-Administered Medications Ordered in Other Visits  Medication Dose Route Frequency Provider Last Rate Last Admin   sodium chloride flush (NS) 0.9 % injection 10 mL  10 mL Intracatheter Once [name], MD        PHYSICAL EXAMINATION: ECOG PERFORMANCE STATUS: 1 - Symptomatic but completely ambulatory  There were no vitals filed for this visit. Wt Readings from Last 3 Encounters:  11/08/20 182 lb 4.8 oz (82.7 kg)  10/16/20 181 lb 12.8 oz (82.5 kg)  10/02/20 180 lb 6.4 oz (81.8 kg)     GENERAL:alert, no distress and comfortable SKIN: skin color normal, no rashes or significant lesions EYES: normal, Conjunctiva are pink and non-injected, sclera clear  NEURO: alert & oriented x 3 with fluent speech  LABORATORY DATA:  I have reviewed the data as listed CBC Latest Ref Rng & Units 11/08/2020 10/16/2020 10/02/2020  WBC 4.0 - 10.5 K/uL 5.7 5.2 5.7  Hemoglobin 12.0 - 15.0 g/dL 12.3 12.5 12.4  Hematocrit 36.0 - 46.0 % 35.2(L) 37.0 35.6(L)  Platelets 150 - 400 K/uL 202 223 213     CMP Latest Ref Rng & Units 11/08/2020 10/16/2020 10/02/2020  Glucose 70 - 99 mg/dL 97 102(H) 104(H)  BUN 8 - 23 mg/dL $Remove'20 12 9  'MpPOYLZ$ Creatinine 0.44 - 1.00 mg/dL 0.74 0.64 0.71  Sodium 135 - 145 mmol/L 137 140 141  Potassium 3.5 - 5.1 mmol/L 4.2 3.8 4.1  Chloride 98 - 111 mmol/L 100 103 105  CO2 22 - 32 mmol/L $RemoveB'25 25 26  'ryzyeMeB$ Calcium 8.9 - 10.3 mg/dL 9.5 9.9 9.4  Total Protein 6.5 - 8.1 g/dL 7.8 7.9 7.9  Total Bilirubin 0.3 - 1.2 mg/dL 0.4 0.4 0.3  Alkaline Phos 38 - 126 U/L 87 96 86  AST 15 - 41 U/L 35 32 30  ALT 0 - 44 U/L 33 31 27      RADIOGRAPHIC STUDIES: I have personally reviewed the radiological images as listed and agreed with the findings in the report. No results found.    No orders of the defined types were placed in this encounter.  All questions were answered. The patient knows to call the clinic with any  problems, questions or concerns. No barriers to learning was detected. The total time spent in the appointment was 30 minutes.     [name], MD 11/29/2020   I, Wilburn Mylar,  am acting as scribe for [name], MD.   I have reviewed the above documentation for accuracy and completeness, and I agree with the above.

## 2020-11-29 NOTE — Patient Instructions (Signed)
[facility]  Discharge Instructions: Thank you for choosing [facility] to provide your oncology and hematology care.   If you have a lab appointment with the [facility], please go directly to the [facility] and check in at the registration area.   Wear comfortable clothing and clothing appropriate for easy access to any Portacath or PICC line.   We strive to give you quality time with your provider. You may need to reschedule your appointment if you arrive late (15 or more minutes).  Arriving late affects you and other patients whose appointments are after yours.  Also, if you miss three or more appointments without notifying the office, you may be dismissed from the clinic at the provider's discretion.      For prescription refill requests, have your pharmacy contact our office and allow 72 hours for refills to be completed.    Today you received the following chemotherapy and/or immunotherapy agents :  Panatumumab      To help prevent nausea and vomiting after your treatment, we encourage you to take your nausea medication as directed.  BELOW ARE SYMPTOMS THAT SHOULD BE REPORTED IMMEDIATELY: *FEVER GREATER THAN 100.4 F (38 C) OR HIGHER *CHILLS OR SWEATING *NAUSEA AND VOMITING THAT IS NOT CONTROLLED WITH YOUR NAUSEA MEDICATION *UNUSUAL SHORTNESS OF BREATH *UNUSUAL BRUISING OR BLEEDING *URINARY PROBLEMS (pain or burning when urinating, or frequent urination) *BOWEL PROBLEMS (unusual diarrhea, constipation, pain near the anus) TENDERNESS IN MOUTH AND THROAT WITH OR WITHOUT PRESENCE OF ULCERS (sore throat, sores in mouth, or a toothache) UNUSUAL RASH, SWELLING OR PAIN  UNUSUAL VAGINAL DISCHARGE OR ITCHING   Items with * indicate a potential emergency and should be followed up as soon as possible or go to the Emergency Department if any problems should occur.  Please show the CHEMOTHERAPY ALERT CARD or IMMUNOTHERAPY ALERT CARD at check-in  to the Emergency Department and triage nurse.  Should you have questions after your visit or need to cancel or reschedule your appointment, please contact [facility]  Dept: [phone]  and follow the prompts.  Office hours are 8:00 a.m. to 4:30 p.m. Monday - Friday. Please note that voicemails left after 4:00 p.m. may not be returned until the following business day.  We are closed weekends and major holidays. You have access to a nurse at all times for urgent questions. Please call the main number to the clinic Dept: [phone] and follow the prompts.   For any non-urgent questions, you may also contact your provider using MyChart. We now offer e-Visits for anyone 98 and older to request care online for non-urgent symptoms. For details visit [email or web address].   Also download the MyChart app! Go to the app store, search "MyChart", open the app, select [facility], and log in with your MyChart username and password.  Due to Covid, a mask is required upon entering the hospital/clinic. If you do not have a mask, one will be given to you upon arrival. For doctor visits, patients may have 1 support person aged 41 or older with them. For treatment visits, patients cannot have anyone with them due to current Covid guidelines and our immunocompromised population.   Magnesium Sulfate Injection What is this medication? MAGNESIUM SULFATE (mag NEE zee um SUL fate) prevents and treats low levels of magnesium in your body. It may also be used to prevent and treat seizures during pregnancy in people with high blood pressure disorders, such as preeclampsia  or eclampsia. Magnesium plays an important role in maintaining the health of your muscles and nervous system. This medicine may be used for other purposes; ask your health care provider or pharmacist if you have questions. What should I tell my care team before I take this medication? They need to know if you have any of  these conditions: Heart disease History of irregular heart beat Kidney disease An unusual or allergic reaction to magnesium sulfate, medications, foods, dyes, or preservatives Pregnant or trying to get pregnant Breast-feeding How should I use this medication? This medication is for infusion into a vein. It is given in a hospital or clinic setting. Talk to your care team about the use of this medication in children. While this medication may be prescribed for selected conditions, precautions do apply. Overdosage: If you think you have taken too much of this medicine contact a poison control center or emergency room at once. NOTE: This medicine is only for you. Do not share this medicine with others. What if I miss a dose? This does not apply. What may interact with this medication? Certain medications for anxiety or sleep Certain medications for seizures like phenobarbital Digoxin Medications that relax muscles for surgery Narcotic medications for pain This list may not describe all possible interactions. Give your health care provider a list of all the medicines, herbs, non-prescription drugs, or dietary supplements you use. Also tell them if you smoke, drink alcohol, or use illegal drugs. Some items may interact with your medicine. What should I watch for while using this medication? Your condition will be monitored carefully while you are receiving this medication. You may need blood work done while you are receiving this medication. What side effects may I notice from receiving this medication? Side effects that you should report to your care team as soon as possible: Allergic reactions-skin rash, itching, hives, swelling of the face, lips, tongue, or throat High magnesium level-confusion, drowsiness, facial flushing, redness, sweating, muscle weakness, fast or irregular heartbeat, trouble breathing Low blood pressure-dizziness, feeling faint or lightheaded, blurry vision Side effects  that usually do not require medical attention (report to your care team if they continue or are bothersome): Headache Nausea This list may not describe all possible side effects. Call your doctor for medical advice about side effects. You may report side effects to FDA at 1-800-FDA-1088. Where should I keep my medication? This medication is given in a hospital or clinic and will not be stored at home. NOTE: This sheet is a summary. It may not cover all possible information. If you have questions about this medicine, talk to your doctor, pharmacist, or health care provider.  2022 Elsevier/Gold Standard (2020-04-15 13:10:26)

## 2020-11-30 ENCOUNTER — Encounter: Payer: Self-pay | Admitting: Hematology

## 2020-12-16 ENCOUNTER — Other Ambulatory Visit (HOSPITAL_COMMUNITY): Payer: Self-pay

## 2020-12-19 ENCOUNTER — Other Ambulatory Visit (HOSPITAL_COMMUNITY): Payer: Self-pay

## 2020-12-20 ENCOUNTER — Other Ambulatory Visit: Payer: Self-pay | Admitting: Pharmacist

## 2020-12-20 ENCOUNTER — Inpatient Hospital Stay: Payer: Medicare (Managed Care) | Attending: Hematology

## 2020-12-20 ENCOUNTER — Encounter: Payer: Self-pay | Admitting: Hematology

## 2020-12-20 ENCOUNTER — Inpatient Hospital Stay: Payer: Medicare (Managed Care)

## 2020-12-20 ENCOUNTER — Other Ambulatory Visit: Payer: Self-pay | Admitting: Hematology

## 2020-12-20 ENCOUNTER — Inpatient Hospital Stay: Payer: Medicare (Managed Care) | Admitting: Hematology

## 2020-12-20 ENCOUNTER — Other Ambulatory Visit: Payer: Self-pay

## 2020-12-20 ENCOUNTER — Other Ambulatory Visit (HOSPITAL_COMMUNITY): Payer: Self-pay

## 2020-12-20 VITALS — BP 137/77 | HR 78 | Temp 98.1°F | Resp 18 | Ht 66.0 in | Wt 182.8 lb

## 2020-12-20 DIAGNOSIS — C19 Malignant neoplasm of rectosigmoid junction: Secondary | ICD-10-CM | POA: Insufficient documentation

## 2020-12-20 DIAGNOSIS — Z7984 Long term (current) use of oral hypoglycemic drugs: Secondary | ICD-10-CM | POA: Insufficient documentation

## 2020-12-20 DIAGNOSIS — C7802 Secondary malignant neoplasm of left lung: Secondary | ICD-10-CM | POA: Diagnosis not present

## 2020-12-20 DIAGNOSIS — I10 Essential (primary) hypertension: Secondary | ICD-10-CM | POA: Diagnosis not present

## 2020-12-20 DIAGNOSIS — R011 Cardiac murmur, unspecified: Secondary | ICD-10-CM | POA: Insufficient documentation

## 2020-12-20 DIAGNOSIS — C787 Secondary malignant neoplasm of liver and intrahepatic bile duct: Secondary | ICD-10-CM | POA: Diagnosis present

## 2020-12-20 DIAGNOSIS — C778 Secondary and unspecified malignant neoplasm of lymph nodes of multiple regions: Secondary | ICD-10-CM | POA: Diagnosis not present

## 2020-12-20 DIAGNOSIS — C189 Malignant neoplasm of colon, unspecified: Secondary | ICD-10-CM

## 2020-12-20 DIAGNOSIS — Z7189 Other specified counseling: Secondary | ICD-10-CM

## 2020-12-20 DIAGNOSIS — Z86010 Personal history of colonic polyps: Secondary | ICD-10-CM | POA: Insufficient documentation

## 2020-12-20 DIAGNOSIS — C221 Intrahepatic bile duct carcinoma: Secondary | ICD-10-CM

## 2020-12-20 DIAGNOSIS — M1990 Unspecified osteoarthritis, unspecified site: Secondary | ICD-10-CM | POA: Diagnosis not present

## 2020-12-20 DIAGNOSIS — E119 Type 2 diabetes mellitus without complications: Secondary | ICD-10-CM | POA: Diagnosis not present

## 2020-12-20 DIAGNOSIS — Z5112 Encounter for antineoplastic immunotherapy: Secondary | ICD-10-CM | POA: Diagnosis not present

## 2020-12-20 DIAGNOSIS — T451X5D Adverse effect of antineoplastic and immunosuppressive drugs, subsequent encounter: Secondary | ICD-10-CM | POA: Diagnosis not present

## 2020-12-20 DIAGNOSIS — G620 Drug-induced polyneuropathy: Secondary | ICD-10-CM | POA: Insufficient documentation

## 2020-12-20 DIAGNOSIS — Z87891 Personal history of nicotine dependence: Secondary | ICD-10-CM | POA: Diagnosis not present

## 2020-12-20 DIAGNOSIS — Z95828 Presence of other vascular implants and grafts: Secondary | ICD-10-CM

## 2020-12-20 LAB — CBC WITH DIFFERENTIAL (CANCER CENTER ONLY)
Abs Immature Granulocytes: 0.02 10*3/uL (ref 0.00–0.07)
Basophils Absolute: 0 10*3/uL (ref 0.0–0.1)
Basophils Relative: 0 %
Eosinophils Absolute: 0.1 10*3/uL (ref 0.0–0.5)
Eosinophils Relative: 2 %
HCT: 33.9 % — ABNORMAL LOW (ref 36.0–46.0)
Hemoglobin: 11.8 g/dL — ABNORMAL LOW (ref 12.0–15.0)
Immature Granulocytes: 0 %
Lymphocytes Relative: 19 %
Lymphs Abs: 1.1 10*3/uL (ref 0.7–4.0)
MCH: 33.1 pg (ref 26.0–34.0)
MCHC: 34.8 g/dL (ref 30.0–36.0)
MCV: 95 fL (ref 80.0–100.0)
Monocytes Absolute: 0.6 10*3/uL (ref 0.1–1.0)
Monocytes Relative: 11 %
Neutro Abs: 4 10*3/uL (ref 1.7–7.7)
Neutrophils Relative %: 68 %
Platelet Count: 225 10*3/uL (ref 150–400)
RBC: 3.57 MIL/uL — ABNORMAL LOW (ref 3.87–5.11)
RDW: 17.1 % — ABNORMAL HIGH (ref 11.5–15.5)
WBC Count: 5.9 10*3/uL (ref 4.0–10.5)
nRBC: 0 % (ref 0.0–0.2)

## 2020-12-20 LAB — CMP (CANCER CENTER ONLY)
ALT: 35 U/L (ref 0–44)
AST: 42 U/L — ABNORMAL HIGH (ref 15–41)
Albumin: 4.3 g/dL (ref 3.5–5.0)
Alkaline Phosphatase: 81 U/L (ref 38–126)
Anion gap: 10 (ref 5–15)
BUN: 19 mg/dL (ref 8–23)
CO2: 26 mmol/L (ref 22–32)
Calcium: 9.3 mg/dL (ref 8.9–10.3)
Chloride: 97 mmol/L — ABNORMAL LOW (ref 98–111)
Creatinine: 0.81 mg/dL (ref 0.44–1.00)
GFR, Estimated: >60 mL/min (ref >60)
Glucose, Bld: 109 mg/dL — ABNORMAL HIGH (ref 70–99)
Potassium: 3.6 mmol/L (ref 3.5–5.1)
Sodium: 133 mmol/L — ABNORMAL LOW (ref 135–145)
Total Bilirubin: 0.6 mg/dL (ref 0.3–1.2)
Total Protein: 8.2 g/dL — ABNORMAL HIGH (ref 6.5–8.1)

## 2020-12-20 LAB — CEA (IN HOUSE-CHCC): CEA (CHCC-In House): 1.15 ng/mL (ref 0.00–5.00)

## 2020-12-20 LAB — MAGNESIUM: Magnesium: 1.8 mg/dL (ref 1.7–2.4)

## 2020-12-20 MED ORDER — SODIUM CHLORIDE 0.9 % IV SOLN: Freq: Once | INTRAVENOUS | Status: AC

## 2020-12-20 MED ORDER — CAPECITABINE 500 MG PO TABS
1000.0000 mg/m2 | ORAL_TABLET | Freq: Two times a day (BID) | ORAL | 1 refills | Status: DC
  Filled 2020-12-20: qty 112, 21d supply, fill #0
  Filled 2021-01-06: qty 112, 21d supply, fill #1

## 2020-12-20 MED ORDER — HEPARIN SOD (PORK) LOCK FLUSH 100 UNIT/ML IV SOLN
500.0000 [IU] | Freq: Once | INTRAVENOUS | Status: AC | PRN
  Administered 2020-12-20: 500 [IU]

## 2020-12-20 MED ORDER — SODIUM CHLORIDE 0.9 % IV SOLN
6.0000 mg/kg | Freq: Once | INTRAVENOUS | Status: AC
  Administered 2020-12-20: 500 mg via INTRAVENOUS
  Filled 2020-12-20: qty 20

## 2020-12-20 MED ORDER — SODIUM CHLORIDE 0.9% FLUSH
10.0000 mL | INTRAVENOUS | Status: DC | PRN
  Administered 2020-12-20: 10 mL

## 2020-12-20 MED ORDER — SODIUM CHLORIDE 0.9% FLUSH
10.0000 mL | Freq: Once | INTRAVENOUS | Status: AC
  Administered 2020-12-20: 10 mL

## 2020-12-20 MED ORDER — MAGNESIUM SULFATE 4 GM/100ML IV SOLN
4.0000 g | Freq: Once | INTRAVENOUS | Status: AC
  Administered 2020-12-20: 4 g via INTRAVENOUS
  Filled 2020-12-20: qty 100

## 2020-12-20 MED ORDER — MAGNESIUM OXIDE 400 MG PO TABS: 2.0000 | ORAL_TABLET | Freq: Three times a day (TID) | ORAL | 2 refills | Status: DC

## 2020-12-20 MED ORDER — CAPECITABINE 500 MG PO TABS
1000.0000 mg/m2 | ORAL_TABLET | Freq: Two times a day (BID) | ORAL | 1 refills | Status: DC
Start: 2020-12-20 — End: 2020-12-20
  Filled 2020-12-20: qty 112, 21d supply, fill #0

## 2020-12-20 NOTE — Progress Notes (Signed)
[facility]   Telephone:([phone]) [phone] Fax:(336) [phone]   Clinic Follow up Note   Patient Care Team: [name], MD as PCP - General (Internal Medicine) [name], RN (Inactive) as Oncology Nurse Navigator [name], MD as Consulting Physician (Oncology) [name], MD as Consulting Physician (Gastroenterology)  Date of Service:  12/20/2020  CHIEF COMPLAINT: f/u of metastatic colon cancer  CURRENT THERAPY:  First line FOLFOX starting 01/25/2020, Vextibix added with C2 (02/06/20), oxaliplatin stopped after September 18, 2020 due to infusion reaction.  5-FU infusion changed to Xeloda after October 02, 2020. first cycle she took 1542m bid for 14 days, dose increased from cycle 2 and to full dose from cycle 3  ASSESSMENT & PLAN:  Lindsay KAUTZMANis a 65y.o. female with   1. 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 There is a hypermetabolic 1.8 cm pulmonary nodule in left upper lobe, and focal hypermetabolic lesion in the splenic fixture of the colon, concerning for primary tumor. -I started her on first-line FOLFOX q2weeks on 01/25/20. Vectibix was added with C2.  -06/20/20 CT CAP showed good response.  -due to her recent reaction to Oxaliplatin, stopped after cycle 18 -she again had a vasovagal-like reaction in the infusion room after Vectibix infusion, before 5-FU on 08/02/20. No issue noted 10/02/20. -Due to her skin crackling and infusion reaction from 5-FU, I switched her to Xarelto on 1500 mg BID for 14 days for first cycle. She has slowly increased her Xeloda and will begin full dose with cycle 3. -she has had no further issues with Vectibix infusions.  -labs reviewed overall adequate to proceed with Vectibix today. Magnesium level is pending. -she will be due for repeat scan next month. She would prefer  to wait until after the holidays. -Her CEA has been in normal range which is a good indicator that her cancer is controlled  -labs and f/u in 3 weeks   2. Mild Neuropathy G1 -S/p C5 she developed intermittent mild neuropathy with numbness in her fingertips only. -Oxaliplatin dose reduced from C6 and discontinued after reaction 09/18/20. -she reports continued numbness to the bottom of her feet and her fingertips.   3. Chronic Lower abdominal pain, Nausea, secondary to #1 -She has had chronic low abdominal pain for 5 years ranging up to 4 times a week. She notes her pain worsened in the last 3 years, but no work-up until 2021.   -For pain she is now on Tramadol for severe pain. She can otherwise she Tylenol -overall improved   4. HTN, DM, Heart Murmur, Arthritis -On medications. Managed by her PCP and Cardiologist Dr GEinar Gip  5. Financial and Social Support -She has STherapist, sports She is retired. -She lives with husband and has brothers and sister in and out of town. Her only child passed in recent years. -She notes her husband is aware of her condition and treatment, but she keeps the information to a minimum as she feels he may not handle it well.    6. Goals of care -she understands the goal is palliative  -She is full code.    7. Hypomagnesia secondary to Vectibix  -she has been on 4g mag iv with chemo and 4g on day 3 -continue oral mag 2 tabs tid, I refilled today    8. Paronychia -probably secondary to her chemo  -  I recommended her to try putting her hands in vinegar and warm water twice a day, and use topical antibiotics around nails  PLAN:  -proceed with Vectibix today -restart Xeloda on 12/24/20, at 2062m (4 tablets) BID for 14 days. I refilled today -I also refilled her oral mag -labs, f/u, and Vectibix in 3 weeks   No problem-specific Assessment & Plan notes found for this encounter.   SUMMARY OF ONCOLOGIC HISTORY: Oncology History Overview Note  Cancer  Staging No matching staging information was found for the patient.    metastatic colon cancer to liver  06/20/2019 Procedure   Colonoscopy by Dr MRush Landmark4/13/21  IMPRESSION -Seven 3 to 10 mm polyps in the sigmoid colon, in the transverse colon and in the escending colon, removed with a cold snare. Resected and retrireved.  -One 245mpolyp in the descending colon. Biopsies. Tattoes.  -Mediaum sized lipoma in the ascending colon.   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 See Comment B. Colon, Ascending, polyp, Polypectomy:  -TUBULAR ADENOMA -No high grade dysplasia or malignancy.  C. Colon, TRansverse, Polyo, polectomy:  -TUBULAR ADENOMA -No high grade dysplasia or malignancy.  D. Colon, Sigmoid, Polyp, Polypectomy:  -HYPERPLASTIC POLYP   11/07/2019 Imaging   US[place]oreabdomen 11/07/19  IMPRESSION: 1. Two solid masses in the liver are nonspecific. Recommend MRI abdomen with and without contrast for further evaluation.   12/15/2019 Imaging   MRI Abdomen 12/15/19  IMPRESSION: 1. There are two large masses in the liver with appearance favoring metastatic disease or hepatocellular carcinoma or cholangiocarcinoma. A benign etiology is highly unlikely given the enhancement pattern and associated adenopathy. 2. Considerable porta hepatis and retroperitoneal adenopathy. Some of the confluent porta hepatis tumor is potentially infiltrative and abuts the pancreatic body along its right upper margin, making it difficult to completely exclude the possibility of pancreatic adenocarcinoma primary. Possibilities helpful in further workup might include tissue diagnosis, endoscopic ultrasound, or nuclear medicine PET-CT. 3. Pancreas divisum. 4. Lumbar spondylosis and degenerative disc disease. 5. Despite efforts by the technologist and patient, motion artifact is present on today's exam and could not be eliminated.  This reduces exam sensitivity and specificity.   12/22/2019 Procedure   Upper Endoscopy by Dr HuBenson Norway0/15/21  IMPRESSION - One lymph node was visualized and measured in the porta hepatis region. Fine needle aspiration performed    12/22/2019 Initial Biopsy   A. LIVER, PORTA HEPATIS MASS, FINE NEEDLE 12/22/19 ASPIRATION:  FINAL MICROSCOPIC DIAGNOSIS:  - Malignant cells consistent with metastatic adenocarcinoma   01/01/2020 Initial Diagnosis   [place]  01/08/2020 Initial Biopsy   FINAL MICROSCOPIC DIAGNOSIS:   A. LIVER, LEFT LOBE, BIOPSY:  - Metastatic adenocarcinoma, consistent with a colorectal primary.  See  comment      COMMENT:   Immunohistochemical stains show the tumor cells are positive for CK20  and CDX2 but negative for CK7, consistent with above interpretation.  Dr. FeBurr Medicoas notified on 01/10/2020   01/08/2020 Genetic Testing   [name]  [name] wildtype and [name]/[name] mutations which make her eligible for target biological agent Vectibix.   01/24/2020 Procedure   PAC placed 01/24/20   01/25/2020 -  Chemotherapy   first line FOLFOX starting 01/25/2020, Vextibix  added with C2 (02/06/20)   06/20/2020 Imaging   CT CAP  IMPRESSION: 1. Continued interval reduction in size and conspicuity of a subsolid nodule of the peripheral left upper lobe. 2. Unchanged prominent pretracheal  and subcarinal lymph nodes. 3. Redemonstrated partially calcified low-attenuation liver masses, slightly decreased in size compared to prior examination. 4. Slight interval decrease in size of a portacaval lymph node or conglomerate and retroperitoneal lymph nodes. 5. Findings are consistent with continued treatment response of nodal, pulmonary, and hepatic metastatic disease. 6. Coronary artery disease.   Aortic Atherosclerosis (ICD10-I70.0).     10/09/2020 Imaging   IMPRESSION: 1. Slight decrease in size of dominant liver mass with smaller  liver mass within 1-2 mm of prior measurement. 2. Stable appearance of celiac lymph and retroperitoneal lymph nodes. Dominant node with calcification in the gastrohepatic ligament as described. 3. Continued decrease in size of LEFT upper lobe nodule. 4. Three-vessel coronary artery calcification. 5. Aortic atherosclerosis.      INTERVAL HISTORY:  Lindsay [name] is here for a follow up of metastatic colon cancer. She was last seen by me on 11/30/20. She presents to the clinic alone. She reports she has an infection under her fingernail-- she notes she can push out pus, which will relieve pressure/pain. She denies a foul smell. I advised her to use vinegar and hot water first before I prescribe antibiotics. She has skin darkening but no rash. She denies any bowel issues and any pain. She notes she will restart Xeloda next Tuesday, 10/18.   All other systems were reviewed with the patient and are negative.  MEDICAL HISTORY:  Past Medical History:  Diagnosis Date   Arthritis    Diabetes mellitus without complication ([place])    Hypertension    met colon ca to liver 01/2020    SURGICAL HISTORY: Past Surgical History:  Procedure Laterality Date   ABDOMINAL HYSTERECTOMY     ANTERIOR AND POSTERIOR REPAIR WITH SACROSPINOUS FIXATION N/A 07/16/2016   Procedure: ANTERIOR AND POSTERIOR REPAIR WITH SACROSPINOUS FIXATION;  Surgeon: Everlene Farrier, MD;  Location: [facility];  Service: Gynecology;  Laterality: N/A;   CYSTOSCOPY  07/16/2016   Procedure: CYSTOSCOPY;  Surgeon: Everlene Farrier, MD;  Location: [facility];  Service: Gynecology;;   ESOPHAGOGASTRODUODENOSCOPY (EGD) WITH PROPOFOL N/A 12/22/2019   Procedure: ESOPHAGOGASTRODUODENOSCOPY (EGD) WITH PROPOFOL;  Surgeon: [name], MD;  Location: [facility];  Service: Endoscopy;  Laterality: N/A;   FINE NEEDLE ASPIRATION N/A 12/22/2019   Procedure: FINE NEEDLE ASPIRATION (FNA) LINEAR;  Surgeon: [name], MD;  Location: [facility];  Service:  Endoscopy;  Laterality: N/A;   IR IMAGING GUIDED PORT INSERTION  01/24/2020   LAPAROSCOPIC VAGINAL HYSTERECTOMY WITH SALPINGECTOMY Bilateral 07/16/2016   Procedure: LAPAROSCOPIC ASSISTED VAGINAL HYSTERECTOMY WITH SALPINGECTOMY;  Surgeon: Everlene Farrier, MD;  Location: [facility];  Service: Gynecology;  Laterality: Bilateral;   MYOMECTOMY ABDOMINAL APPROACH     THYROID SURGERY     tyroid     UPPER ESOPHAGEAL ENDOSCOPIC ULTRASOUND (EUS) N/A 12/22/2019   Procedure: UPPER ESOPHAGEAL ENDOSCOPIC ULTRASOUND (EUS);  Surgeon: [name], MD;  Location: [facility];  Service: Endoscopy;  Laterality: N/A;    I have reviewed the social history and family history with the patient and they are unchanged from previous note.  ALLERGIES:  is allergic to oxaliplatin.  MEDICATIONS:  Current Outpatient Medications  Medication Sig Dispense Refill   aspirin EC 81 MG tablet Take 1 tablet (81 mg total) by mouth daily. 90 tablet 3   atorvastatin (LIPITOR) 20 MG tablet Take 20 mg by mouth at bedtime.   1   capecitabine (XELODA) 500 MG tablet Take 4 tablets (2,000 mg total) by mouth 2 (two) times daily after a meal. Take for  14 days, followed by 7 days off. Repeat every 21 days. 112 tablet 1   clindamycin (CLINDAGEL) 1 % gel Apply topically 2 (two) times daily. 60 g 2   clobetasol cream (TEMOVATE) 8.85 % Apply 1 application topically daily.     doxycycline (VIBRA-TABS) 100 MG tablet Take 1 tablet (100 mg total) by mouth 2 (two) times daily. 60 tablet 1   lidocaine-prilocaine (EMLA) cream Apply 1 application topically as needed. 30 g 1   lisinopril-hydrochlorothiazide (ZESTORETIC) 20-12.5 MG tablet Take 1 tablet by mouth daily with breakfast.      magic mouthwash w/lidocaine SOLN Take 5 mLs by mouth 4 (four) times daily. 475 mL 2   magnesium oxide (MAG-OX) 400 (241.3 Mg) MG tablet Take 1 tablet (400 mg total) by mouth in the morning, at noon, and at bedtime. 90 tablet 2   magnesium oxide (MAG-OX) 400 MG tablet Take 2  tablets (800 mg total) by mouth 3 (three) times daily. 180 tablet 2   ondansetron (ZOFRAN) 8 MG tablet Take 1 tablet (8 mg total) by mouth every 8 (eight) hours as needed for nausea or vomiting. 20 tablet 1   prochlorperazine (COMPAZINE) 10 MG tablet Take 1 tablet (10 mg total) by mouth every 6 (six) hours as needed for nausea or vomiting. 30 tablet 2   spironolactone (ALDACTONE) 25 MG tablet Take 25 mg by mouth daily with breakfast.     valACYclovir (VALTREX) 1000 MG tablet Take 1 tablet (1,000 mg total) by mouth 2 (two) times daily. 10 tablet 0   No current facility-administered medications for this visit.    PHYSICAL EXAMINATION: ECOG PERFORMANCE STATUS: 1 - Symptomatic but completely ambulatory  Vitals:   12/20/20 0956  BP: 137/77  Pulse: 78  Resp: 18  Temp: 98.1 F (36.7 C)  SpO2: 100%   Wt Readings from Last 3 Encounters:  12/20/20 182 lb 12.8 oz (82.9 kg)  11/29/20 184 lb 6.4 oz (83.6 kg)  11/08/20 182 lb 4.8 oz (82.7 kg)     GENERAL:alert, no distress and comfortable SKIN: skin color normal, no rashes or significant lesions EYES: normal, Conjunctiva are pink and non-injected, sclera clear  NEURO: alert & oriented x 3 with fluent speech  LABORATORY DATA:  I have reviewed the data as listed CBC Latest Ref Rng & Units 12/20/2020 11/29/2020 11/08/2020  WBC 4.0 - 10.5 K/uL 5.9 5.0 5.7  Hemoglobin 12.0 - 15.0 g/dL 11.8(L) 12.1 12.3  Hematocrit 36.0 - 46.0 % 33.9(L) 34.6(L) 35.2(L)  Platelets 150 - 400 K/uL 225 202 202     CMP Latest Ref Rng & Units 11/29/2020 11/08/2020 10/16/2020  Glucose 70 - 99 mg/dL 118(H) 97 102(H)  BUN 8 - 23 mg/dL 23 20 12   Creatinine 0.44 - 1.00 mg/dL 0.83 0.74 0.64  Sodium 135 - 145 mmol/L 136 137 140  Potassium 3.5 - 5.1 mmol/L 4.1 4.2 3.8  Chloride 98 - 111 mmol/L 99 100 103  CO2 22 - 32 mmol/L 27 25 25   Calcium 8.9 - 10.3 mg/dL 10.4(H) 9.5 9.9  Total Protein 6.5 - 8.1 g/dL 8.0 7.8 7.9  Total Bilirubin 0.3 - 1.2 mg/dL 0.4 0.4 0.4  Alkaline  Phos 38 - 126 U/L 94 87 96  AST 15 - 41 U/L 33 35 32  ALT 0 - 44 U/L 30 33 31      RADIOGRAPHIC STUDIES: I have personally reviewed the radiological images as listed and agreed with the findings in the report. No results found.  No orders of the defined types were placed in this encounter.  All questions were answered. The patient knows to call the clinic with any problems, questions or concerns. No barriers to learning was detected. The total time spent in the appointment was 30 minutes.     [name], MD 12/20/2020   I, Wilburn Mylar, am acting as scribe for [name], MD.   I have reviewed the above documentation for accuracy and completeness, and I agree with the above.

## 2020-12-20 NOTE — Patient Instructions (Signed)
[facility]  Discharge Instructions: Thank you for choosing [facility] to provide your oncology and hematology care.   If you have a lab appointment with the [facility], please go directly to the [facility] and check in at the registration area.   Wear comfortable clothing and clothing appropriate for easy access to any Portacath or PICC line.   We strive to give you quality time with your provider. You may need to reschedule your appointment if you arrive late (15 or more minutes).  Arriving late affects you and other patients whose appointments are after yours.  Also, if you miss three or more appointments without notifying the office, you may be dismissed from the clinic at the provider's discretion.      For prescription refill requests, have your pharmacy contact our office and allow 72 hours for refills to be completed.    Today you received the following chemotherapy and/or immunotherapy agents :  Panatumumab      To help prevent nausea and vomiting after your treatment, we encourage you to take your nausea medication as directed.  BELOW ARE SYMPTOMS THAT SHOULD BE REPORTED IMMEDIATELY: *FEVER GREATER THAN 100.4 F (38 C) OR HIGHER *CHILLS OR SWEATING *NAUSEA AND VOMITING THAT IS NOT CONTROLLED WITH YOUR NAUSEA MEDICATION *UNUSUAL SHORTNESS OF BREATH *UNUSUAL BRUISING OR BLEEDING *URINARY PROBLEMS (pain or burning when urinating, or frequent urination) *BOWEL PROBLEMS (unusual diarrhea, constipation, pain near the anus) TENDERNESS IN MOUTH AND THROAT WITH OR WITHOUT PRESENCE OF ULCERS (sore throat, sores in mouth, or a toothache) UNUSUAL RASH, SWELLING OR PAIN  UNUSUAL VAGINAL DISCHARGE OR ITCHING   Items with * indicate a potential emergency and should be followed up as soon as possible or go to the Emergency Department if any problems should occur.  Please show the CHEMOTHERAPY ALERT CARD or IMMUNOTHERAPY ALERT CARD at check-in  to the Emergency Department and triage nurse.  Should you have questions after your visit or need to cancel or reschedule your appointment, please contact [facility]  Dept: [phone]  and follow the prompts.  Office hours are 8:00 a.m. to 4:30 p.m. Monday - Friday. Please note that voicemails left after 4:00 p.m. may not be returned until the following business day.  We are closed weekends and major holidays. You have access to a nurse at all times for urgent questions. Please call the main number to the clinic Dept: [phone] and follow the prompts.   For any non-urgent questions, you may also contact your provider using MyChart. We now offer e-Visits for anyone 7 and older to request care online for non-urgent symptoms. For details visit [email or web address].   Also download the MyChart app! Go to the app store, search "MyChart", open the app, select [facility], and log in with your MyChart username and password.  Due to Covid, a mask is required upon entering the hospital/clinic. If you do not have a mask, one will be given to you upon arrival. For doctor visits, patients may have 1 support person aged 58 or older with them. For treatment visits, patients cannot have anyone with them due to current Covid guidelines and our immunocompromised population.   Magnesium Sulfate Injection What is this medication? MAGNESIUM SULFATE (mag NEE zee um SUL fate) prevents and treats low levels of magnesium in your body. It may also be used to prevent and treat seizures during pregnancy in people with high blood pressure disorders, such as preeclampsia  or eclampsia. Magnesium plays an important role in maintaining the health of your muscles and nervous system. This medicine may be used for other purposes; ask your health care provider or pharmacist if you have questions. What should I tell my care team before I take this medication? They need to know if you have any of  these conditions: Heart disease History of irregular heart beat Kidney disease An unusual or allergic reaction to magnesium sulfate, medications, foods, dyes, or preservatives Pregnant or trying to get pregnant Breast-feeding How should I use this medication? This medication is for infusion into a vein. It is given in a hospital or clinic setting. Talk to your care team about the use of this medication in children. While this medication may be prescribed for selected conditions, precautions do apply. Overdosage: If you think you have taken too much of this medicine contact a poison control center or emergency room at once. NOTE: This medicine is only for you. Do not share this medicine with others. What if I miss a dose? This does not apply. What may interact with this medication? Certain medications for anxiety or sleep Certain medications for seizures like phenobarbital Digoxin Medications that relax muscles for surgery Narcotic medications for pain This list may not describe all possible interactions. Give your health care provider a list of all the medicines, herbs, non-prescription drugs, or dietary supplements you use. Also tell them if you smoke, drink alcohol, or use illegal drugs. Some items may interact with your medicine. What should I watch for while using this medication? Your condition will be monitored carefully while you are receiving this medication. You may need blood work done while you are receiving this medication. What side effects may I notice from receiving this medication? Side effects that you should report to your care team as soon as possible: Allergic reactions-skin rash, itching, hives, swelling of the face, lips, tongue, or throat High magnesium level-confusion, drowsiness, facial flushing, redness, sweating, muscle weakness, fast or irregular heartbeat, trouble breathing Low blood pressure-dizziness, feeling faint or lightheaded, blurry vision Side effects  that usually do not require medical attention (report to your care team if they continue or are bothersome): Headache Nausea This list may not describe all possible side effects. Call your doctor for medical advice about side effects. You may report side effects to FDA at 1-800-FDA-1088. Where should I keep my medication? This medication is given in a hospital or clinic and will not be stored at home. NOTE: This sheet is a summary. It may not cover all possible information. If you have questions about this medicine, talk to your doctor, pharmacist, or health care provider.  2022 Elsevier/Gold Standard (2020-04-15 13:10:26)

## 2020-12-20 NOTE — Progress Notes (Signed)
Oral Oncology Pharmacist Encounter  Medication inadvertently deleted in Epic, thus unable to be filled at [facility]. Medication reordered and resent to [facility] for patient to pick up 12/20/20.  Leron Croak, PharmD, BCPS Hematology/Oncology Clinical Pharmacist [facility] [phone] 12/20/2020 4:08 PM

## 2020-12-23 ENCOUNTER — Telehealth: Payer: Self-pay | Admitting: Hematology

## 2020-12-23 NOTE — Telephone Encounter (Signed)
Left message with follow-up appointments per 10/14 los. 

## 2021-01-02 ENCOUNTER — Other Ambulatory Visit: Payer: Self-pay | Admitting: Hematology

## 2021-01-06 ENCOUNTER — Other Ambulatory Visit (HOSPITAL_COMMUNITY): Payer: Self-pay

## 2021-01-08 ENCOUNTER — Other Ambulatory Visit: Payer: Self-pay

## 2021-01-08 ENCOUNTER — Other Ambulatory Visit (HOSPITAL_COMMUNITY): Payer: Self-pay

## 2021-01-08 DIAGNOSIS — C189 Malignant neoplasm of colon, unspecified: Secondary | ICD-10-CM

## 2021-01-08 NOTE — Progress Notes (Signed)
[name]   Telephone:([phone] Fax:(336) [phone]   Clinic Follow up Note   Patient Care Team: [name], MD as PCP - General (Internal Medicine) [name], RN (Inactive) as Oncology Nurse Navigator [name], MD as Consulting Physician (Oncology) [name], MD as Consulting Physician (Gastroenterology) 01/09/2021  CHIEF COMPLAINT: F/up metastatic colon cancer   SUMMARY OF ONCOLOGIC HISTORY: Oncology History Overview Note  Cancer Staging No matching staging information was found for the patient.    metastatic colon cancer to liver  06/20/2019 Procedure   Colonoscopy by Dr [name] 06/20/19  IMPRESSION -Seven 3 to 10 mm polyps in the sigmoid colon, in the transverse colon and in the escending colon, removed with a cold snare. Resected and retrireved.  -One 7m polyp in the descending colon. Biopsies. Tattoes.  -Mediaum sized lipoma in the ascending colon.   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 See Comment B. Colon, Ascending, polyp, Polypectomy:  -TUBULAR ADENOMA -No high grade dysplasia or malignancy.  C. Colon, TRansverse, Polyo, polectomy:  -TUBULAR ADENOMA -No high grade dysplasia or malignancy.  D. Colon, Sigmoid, Polyp, Polypectomy:  -HYPERPLASTIC POLYP   11/07/2019 Imaging   [place]oreaAbdomen 11/07/19  IMPRESSION: 1. Two solid masses in the liver are nonspecific. Recommend MRI abdomen with and without contrast for further evaluation.   12/15/2019 Imaging   MRI Abdomen 12/15/19  IMPRESSION: 1. There are two large masses in the liver with appearance favoring metastatic disease or hepatocellular carcinoma or cholangiocarcinoma. A benign etiology is highly unlikely given the enhancement pattern and associated adenopathy. 2. Considerable porta hepatis and retroperitoneal adenopathy. Some of the confluent porta hepatis tumor is potentially  infiltrative and abuts the pancreatic body along its right upper margin, making it difficult to completely exclude the possibility of pancreatic adenocarcinoma primary. Possibilities helpful in further workup might include tissue diagnosis, endoscopic ultrasound, or nuclear medicine PET-CT. 3. Pancreas divisum. 4. Lumbar spondylosis and degenerative disc disease. 5. Despite efforts by the technologist and patient, motion artifact is present on today's exam and could not be eliminated. This reduces exam sensitivity and specificity.   12/22/2019 Procedure   Upper Endoscopy by Dr [name]10/15/21  IMPRESSION - One lymph node was visualized and measured in the porta hepatis region. Fine needle aspiration performed    12/22/2019 Initial Biopsy   A. LIVER, PORTA HEPATIS MASS, FINE NEEDLE 12/22/19 ASPIRATION:  FINAL MICROSCOPIC DIAGNOSIS:  - Malignant cells consistent with metastatic adenocarcinoma   01/01/2020 Initial Diagnosis   Intrahepatic cholangiocarcinoma ([name]   01/08/2020 Initial Biopsy   FINAL MICROSCOPIC DIAGNOSIS:   A. LIVER, LEFT LOBE, BIOPSY:  - Metastatic adenocarcinoma, consistent with a colorectal primary.  See  comment      COMMENT:   Immunohistochemical stains show the tumor cells are positive for CK20  and CDX2 but negative for CK7, consistent with above interpretation.  Dr. [name] notified on 01/10/2020   01/08/2020 Genetic Testing   [name]  [name] wildtype and [name]/[name] mutations which make her eligible for target biological agent Vectibix.   01/24/2020 Procedure   PAC placed 01/24/20   01/25/2020 -  Chemotherapy   first line FOLFOX starting 01/25/2020, Vextibix  added with C2 (02/06/20)   06/20/2020 Imaging   CT CAP  IMPRESSION: 1. Continued interval reduction in size and conspicuity of a subsolid nodule of the peripheral left upper lobe. 2. Unchanged prominent pretracheal and subcarinal lymph nodes. 3. Redemonstrated partially calcified  low-attenuation liver masses, slightly decreased in size compared to prior examination. 4. Slight interval decrease in size of a portacaval lymph node or conglomerate and retroperitoneal lymph nodes. 5. Findings are consistent with continued treatment response of nodal, pulmonary, and hepatic metastatic disease. 6. Coronary artery disease.   Aortic Atherosclerosis (ICD10-I70.0).     10/09/2020 Imaging   IMPRESSION: 1. Slight decrease in size of dominant liver mass with smaller liver mass within 1-2 mm of prior measurement. 2. Stable appearance of celiac lymph and retroperitoneal lymph nodes. Dominant node with calcification in the gastrohepatic ligament as described. 3. Continued decrease in size of LEFT upper lobe nodule. 4. Three-vessel coronary artery calcification. 5. Aortic atherosclerosis.     CURRENT THERAPY:  First line FOLFOX starting 01/25/2020, Vextibix added with C2 (02/06/20), oxaliplatin stopped after September 18, 2020 due to infusion reaction.  5-FU infusion changed to Xeloda after October 02, 2020. first cycle she took 1585m bid for 14 days, dose increased from cycle 2 and to full dose from cycle 3  INTERVAL HISTORY: Ms. FMarinerreturns for follow up and treatment as scheduled. She was last seen 12/20/20 and completed another cycle of vectibix.  She was taking Xeloda 2000 mg twice daily as prescribed, completed 2 weeks on 11/1.  Her hands are extremely tender with swelling and cracks at the fingertips and right first finger interdigit.  She has been applying antimicrobial ointment.  Feet also feel tight and tender but less cracking/peeling.  She uses Clindagel for skin rash on her face, no need for doxycycline.  She continues magnesium 2 tabs 3 times a day, was told to stop potassium.  Energy and appetite are adequate.  Her mouth feels sore at times with intermittent lip swelling, this has been going on a long time, resolves with Benadryl.  Denies other signs of allergies such as  throat tightness, cough, chest pain, dyspnea, fever.  Mild nausea without vomiting is well managed.  Bowels moving well.  Denies new or worsening pain or any other concerns.   MEDICAL HISTORY:  Past Medical History:  Diagnosis Date   Arthritis    Diabetes mellitus without complication ([facility]    Hypertension    met colon ca to liver 01/2020    SURGICAL HISTORY: Past Surgical History:  Procedure Laterality Date   ABDOMINAL HYSTERECTOMY     ANTERIOR AND POSTERIOR REPAIR WITH SACROSPINOUS FIXATION N/A 07/16/2016   Procedure: ANTERIOR AND POSTERIOR REPAIR WITH SACROSPINOUS FIXATION;  Surgeon: TEverlene Farrier MD;  [facility];  Service: Gynecology;  Laterality: N/A;   CYSTOSCOPY  07/16/2016   Procedure: CYSTOSCOPY;  Surgeon: TEverlene Farrier MD;  [facility];  Service: Gynecology;;   ESOPHAGOGASTRODUODENOSCOPY (EGD) WITH PROPOFOL N/A 12/22/2019   Procedure: ESOPHAGOGASTRODUODENOSCOPY (EGD) WITH PROPOFOL;  Surgeon: HCarol Ada MD;  [facility];  Service: Endoscopy;  Laterality: N/A;   FINE NEEDLE ASPIRATION N/A 12/22/2019   Procedure: FINE NEEDLE ASPIRATION (FNA) LINEAR;  Surgeon: HCarol Ada MD;  [facility]: [facility];  Service: Endoscopy;  Laterality: N/A;   IR IMAGING GUIDED PORT INSERTION  01/24/2020   LAPAROSCOPIC VAGINAL HYSTERECTOMY WITH SALPINGECTOMY Bilateral 07/16/2016   Procedure: LAPAROSCOPIC ASSISTED VAGINAL HYSTERECTOMY WITH SALPINGECTOMY;  Surgeon: TEverlene Farrier MD;  [facility];  Service: Gynecology;  Laterality: Bilateral;   MYOMECTOMY ABDOMINAL APPROACH     THYROID SURGERY     tyroid     UPPER ESOPHAGEAL ENDOSCOPIC ULTRASOUND (EUS) N/A 12/22/2019   Procedure: UPPER ESOPHAGEAL ENDOSCOPIC ULTRASOUND (EUS);  Surgeon: HCarol Ada MD;  Location:  [facility];  Service: Endoscopy;  Laterality: N/A;    I have reviewed the social history and family history with the patient and they are unchanged from previous note.  ALLERGIES:  is allergic to  oxaliplatin.  MEDICATIONS:  Current Outpatient Medications  Medication Sig Dispense Refill   urea (CARMOL) 10 % cream Apply topically 2 (two) times daily. 71 g 2   aspirin EC 81 MG tablet Take 1 tablet (81 mg total) by mouth daily. 90 tablet 3   atorvastatin (LIPITOR) 20 MG tablet Take 20 mg by mouth at bedtime.   1   capecitabine (XELODA) 500 MG tablet Take 4 tablets (2,000 mg total) by mouth 2 (two) times daily after a meal. Take for 14 days, followed by 7 days off. Repeat every 21 days. 112 tablet 1   clindamycin (CLINDAGEL) 1 % gel Apply topically 2 (two) times daily. 60 g 2   clobetasol cream (TEMOVATE) 4.17 % Apply 1 application topically daily.     doxycycline (VIBRA-TABS) 100 MG tablet Take 1 tablet (100 mg total) by mouth 2 (two) times daily. 60 tablet 1   lidocaine-prilocaine (EMLA) cream Apply 1 application topically as needed. 30 g 1   lisinopril-hydrochlorothiazide (ZESTORETIC) 20-12.5 MG tablet Take 1 tablet by mouth daily with breakfast.      magic mouthwash w/lidocaine SOLN Take 5 mLs by mouth 4 (four) times daily. 475 mL 2   magnesium oxide (MAG-OX) 400 (241.3 Mg) MG tablet Take 1 tablet (400 mg total) by mouth in the morning, at noon, and at bedtime. 90 tablet 2   magnesium oxide (MAG-OX) 400 MG tablet Take 2 tablets (800 mg total) by mouth 3 (three) times daily. 180 tablet 2   ondansetron (ZOFRAN) 8 MG tablet TAKE 1 TABLET BY MOUTH EVERY 8 HOURS AS NEEDED FOR NAUSEA OR VOMITING 20 tablet 0   prochlorperazine (COMPAZINE) 10 MG tablet Take 1 tablet (10 mg total) by mouth every 6 (six) hours as needed for nausea or vomiting. 30 tablet 2   spironolactone (ALDACTONE) 25 MG tablet Take 25 mg by mouth daily with breakfast.     valACYclovir (VALTREX) 1000 MG tablet Take 1 tablet (1,000 mg total) by mouth 2 (two) times daily. 10 tablet 0   No current facility-administered medications for this visit.   Facility-Administered Medications Ordered in Other Visits  Medication Dose Route  Frequency Provider Last Rate Last Admin   sodium chloride flush (NS) 0.9 % injection 10 mL  10 mL Intracatheter PRN [name], MD   10 mL at 01/09/21 1234    PHYSICAL EXAMINATION: ECOG PERFORMANCE STATUS: 1 - Symptomatic but completely ambulatory  Vitals:   01/09/21 0943  BP: 137/85  Pulse: 81  Resp: 17  Temp: 97.6 F (36.4 C)  SpO2: 94%   Filed Weights   01/09/21 0943  Weight: 180 lb 7 oz (81.8 kg)    GENERAL:alert, no distress and comfortable SKIN: Mild acne type rash to face.  Moderate palmar hyperpigmentation with swelling, peeling at some fingertips and right first finger interdigit crack.  Left great toe with cracking at the base.  Paronychia at the fingers EYES: sclera clear OROPHARYNX: No oral edema, thrush, oral ulcers.  Tongue discoloration LUNGS: normal breathing effort HEART:  no lower extremity edema NEURO: alert & oriented x 3 with fluent speech, motor deficits due to hand-foot syndrome PAC without erythema  LABORATORY DATA:  I have reviewed the data as listed CBC Latest Ref Rng & Units 01/09/2021 12/20/2020 11/29/2020  WBC  4.0 - 10.5 K/uL 5.9 5.9 5.0  Hemoglobin 12.0 - 15.0 g/dL 11.1(L) 11.8(L) 12.1  Hematocrit 36.0 - 46.0 % 32.2(L) 33.9(L) 34.6(L)  Platelets 150 - 400 K/uL 218 225 202     CMP Latest Ref Rng & Units 01/09/2021 12/20/2020 11/29/2020  Glucose 70 - 99 mg/dL 100(H) 109(H) 118(H)  BUN 8 - 23 mg/dL 24(H) 19 23  Creatinine 0.44 - 1.00 mg/dL 0.75 0.81 0.83  Sodium 135 - 145 mmol/L 135 133(L) 136  Potassium 3.5 - 5.1 mmol/L 2.9(L) 3.6 4.1  Chloride 98 - 111 mmol/L 94(L) 97(L) 99  CO2 22 - 32 mmol/L 28 26 27   Calcium 8.9 - 10.3 mg/dL 10.4(H) 9.3 10.4(H)  Total Protein 6.5 - 8.1 g/dL 7.6 8.2(H) 8.0  Total Bilirubin 0.3 - 1.2 mg/dL 0.6 0.6 0.4  Alkaline Phos 38 - 126 U/L 72 81 94  AST 15 - 41 U/L 42(H) 42(H) 33  ALT 0 - 44 U/L 29 35 30      RADIOGRAPHIC STUDIES: I have personally reviewed the radiological images as listed and agreed with the  findings in the report. No results found.   ASSESSMENT & PLAN: Lindsay [name] is a 65 y.o. female with   1. 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 MSS, [name]/[name] wildtype -Her 12/15/19 MRI abdomen showed 2 large liver masses indicating metastatic disease and bulky porta hepatis and retroperitoneal adenopathy. -Her EUS from 12/22/19 showed 3.5cm LN in porta hepatis region and biopsy confirmed metastatic adenocarcinoma.  Preliminary cytology sample is not adequate for any additional IHC study for the origin of tumor, the morphology does not support HCC. -Her EGD was negative for malignancy.  Her colonoscopy in March 2021 did show a large 2 cm polyps in descending colon, biopsy showed high-grade dysplasia.   -Liver biopsy confirmed adenocarcinoma, immunostain showed positive CDX2 and CK20, negative CK7, supporting primary colorectal cancer -PET scan showed known liver metastasis, and diffuse thoracic and abdominal adenopathy. There is a hypermetabolic 1.8 cm pulmonary nodule in left upper lobe, and focal hypermetabolic lesion in the splenic fixture of the colon, concerning for primary tumor. -She underwent repeat colonoscopy and biopsy by Dr. Benson Norway; additional IHC testing with TTF-1 shows focal staining, which can be seen in 5-10% of colorectal primary cancers as well as lung cancer. However given the above, the overall picture is still consistent with colorectal primary.  -She began first line systemic FOLFOX on 11/18, goal is palliative. Tolerating well  -FO shows [name]/[name] wild-type, she is a candidate for EGFR inhibitor Panitumumab which was added with C2 -Restaging CT's 04/01/2020 and 06/20/20 showed interval reduction in size of liver, lung, and nodal metastases.  CEA normalized as of 04/16/2020 -Oxaliplatin was stopped after cycle 18 due to reaction.  She also had vasovagal like reaction after Vectibix, before 5-FU on 08/02/2020 but no issues on 10/02/2020.  Due to skin cracking and concern  for 5-FU infusion reactions she was switched to Xeloda.  She has developed significant hand-foot syndrome   2.  Grade 1 neuropathy  -S/p cycle 5, oxaliplatin dose reduced and eventually discontinued after reaction 09/18/2020 -Persistent neuropathy at the feet and fingertips, mild  3. Chronic Lower abdominal pain, Nausea -She has had chronic low abdominal pain for 5 years ranging up to 4 times a week. She notes her pain worsened in the last 3 years, but no work-up until 2021.   -Her pain is 5/10 at most but when high not managed on Motrin.  -she has tramadol  but does not take it much -encouraged her to use Ensure/boost if she has decreased appetite  -f/up with dietician -abdominal pain improved on chemo.  mild an intermittent. Does not require medication    4. HTN, DM, Heart Murmur, Arthritis -Per PCP and Cardiologist Dr Einar Gip -DM controlled, no pre-existing neuropathy   5. 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 -She lives with husband and has brothers and sister in and out of town. Her only child passed in recent years.   6. Goals of care -we discussed the incurable but treatable nature of her stage IV cancer and general prognosis. She understands the prognosis is poor if she does not tolerate or respond well to chemo -she understands the goal is palliative -full code    7. Skin and nail changes, paronychia, hand-foot syndrome -secondary to 5-FU and worse on Xeloda   Disposition: Ms. Birkhead appears stable.  She continues panitumumab q2 weeks and Xeloda 2000 mg twice daily for 14 days on and 7 days off.  She tolerates treatment well except worsening hand-foot syndrome.  We reviewed symptom management, she will start alternating hydrocortisone and urea cream.  I recommend an additional week off Xeloda, if hand-foot syndrome improves she will restart (~01/21/21) 1500 mg twice daily for 2 weeks on and 1 week off.  If she has significant issues we may  decrease to 1 week on and 1 week off.  She is otherwise able to recover and function well.  There is no clinical evidence of disease progression. CEA remains normal.  Labs reviewed, CBC and CMP are stable.  Mag 1.7, continue supplement 2 tabs 3 times daily.  K 2.9, there is no time for electrolyte infusions today per charge nurse.  She will get 44 M EQ p.o. K x1 in infusion then resume 1 tab twice daily at home.  She will return for follow-up in 4 weeks due to the holiday, with next cycle of panitumumab.  Plan to restage few days prior to next visit.    Orders Placed This Encounter  Procedures   CT CHEST ABDOMEN PELVIS W CONTRAST    Standing Status:   Future    Standing Expiration Date:   01/09/2022    Order Specific Question:   Preferred imaging location?    Answer:   [facility]    Order Specific Question:   Is Oral Contrast requested for this exam?    Answer:   Yes, Per Radiology protocol    Order Specific Question:   Reason for Exam (SYMPTOM  OR DIAGNOSIS REQUIRED)    Answer:   metastatic colon cancer restage on chemo    All questions were answered. The patient knows to call the clinic with any problems, questions or concerns. No barriers to learning were detected.     Alla Feeling, NP 01/09/21

## 2021-01-09 ENCOUNTER — Other Ambulatory Visit: Payer: Self-pay

## 2021-01-09 ENCOUNTER — Inpatient Hospital Stay: Payer: Medicare (Managed Care) | Attending: Hematology

## 2021-01-09 ENCOUNTER — Inpatient Hospital Stay: Payer: Medicare (Managed Care) | Admitting: Nurse Practitioner

## 2021-01-09 ENCOUNTER — Inpatient Hospital Stay: Payer: Medicare (Managed Care)

## 2021-01-09 ENCOUNTER — Encounter: Payer: Self-pay | Admitting: Nurse Practitioner

## 2021-01-09 VITALS — BP 137/85 | HR 81 | Temp 97.6°F | Resp 17 | Wt 180.4 lb

## 2021-01-09 DIAGNOSIS — Z95828 Presence of other vascular implants and grafts: Secondary | ICD-10-CM

## 2021-01-09 DIAGNOSIS — Z7984 Long term (current) use of oral hypoglycemic drugs: Secondary | ICD-10-CM | POA: Diagnosis not present

## 2021-01-09 DIAGNOSIS — I10 Essential (primary) hypertension: Secondary | ICD-10-CM | POA: Diagnosis not present

## 2021-01-09 DIAGNOSIS — M1990 Unspecified osteoarthritis, unspecified site: Secondary | ICD-10-CM | POA: Insufficient documentation

## 2021-01-09 DIAGNOSIS — T451X5D Adverse effect of antineoplastic and immunosuppressive drugs, subsequent encounter: Secondary | ICD-10-CM | POA: Diagnosis not present

## 2021-01-09 DIAGNOSIS — C221 Intrahepatic bile duct carcinoma: Secondary | ICD-10-CM

## 2021-01-09 DIAGNOSIS — R011 Cardiac murmur, unspecified: Secondary | ICD-10-CM | POA: Diagnosis not present

## 2021-01-09 DIAGNOSIS — C7802 Secondary malignant neoplasm of left lung: Secondary | ICD-10-CM | POA: Insufficient documentation

## 2021-01-09 DIAGNOSIS — C189 Malignant neoplasm of colon, unspecified: Secondary | ICD-10-CM

## 2021-01-09 DIAGNOSIS — G620 Drug-induced polyneuropathy: Secondary | ICD-10-CM | POA: Insufficient documentation

## 2021-01-09 DIAGNOSIS — Z87891 Personal history of nicotine dependence: Secondary | ICD-10-CM | POA: Diagnosis not present

## 2021-01-09 DIAGNOSIS — L271 Localized skin eruption due to drugs and medicaments taken internally: Secondary | ICD-10-CM | POA: Insufficient documentation

## 2021-01-09 DIAGNOSIS — Z5112 Encounter for antineoplastic immunotherapy: Secondary | ICD-10-CM | POA: Diagnosis not present

## 2021-01-09 DIAGNOSIS — C787 Secondary malignant neoplasm of liver and intrahepatic bile duct: Secondary | ICD-10-CM | POA: Insufficient documentation

## 2021-01-09 DIAGNOSIS — L03019 Cellulitis of unspecified finger: Secondary | ICD-10-CM | POA: Insufficient documentation

## 2021-01-09 DIAGNOSIS — Z7189 Other specified counseling: Secondary | ICD-10-CM

## 2021-01-09 DIAGNOSIS — C778 Secondary and unspecified malignant neoplasm of lymph nodes of multiple regions: Secondary | ICD-10-CM | POA: Insufficient documentation

## 2021-01-09 DIAGNOSIS — C19 Malignant neoplasm of rectosigmoid junction: Secondary | ICD-10-CM | POA: Diagnosis not present

## 2021-01-09 DIAGNOSIS — E876 Hypokalemia: Secondary | ICD-10-CM

## 2021-01-09 DIAGNOSIS — K1379 Other lesions of oral mucosa: Secondary | ICD-10-CM | POA: Diagnosis not present

## 2021-01-09 DIAGNOSIS — Z86010 Personal history of colonic polyps: Secondary | ICD-10-CM | POA: Diagnosis not present

## 2021-01-09 DIAGNOSIS — E1140 Type 2 diabetes mellitus with diabetic neuropathy, unspecified: Secondary | ICD-10-CM | POA: Diagnosis not present

## 2021-01-09 LAB — CBC WITH DIFFERENTIAL (CANCER CENTER ONLY)
Abs Immature Granulocytes: 0.02 10*3/uL (ref 0.00–0.07)
Basophils Absolute: 0 10*3/uL (ref 0.0–0.1)
Basophils Relative: 0 %
Eosinophils Absolute: 0.1 10*3/uL (ref 0.0–0.5)
Eosinophils Relative: 1 %
HCT: 32.2 % — ABNORMAL LOW (ref 36.0–46.0)
Hemoglobin: 11.1 g/dL — ABNORMAL LOW (ref 12.0–15.0)
Immature Granulocytes: 0 %
Lymphocytes Relative: 28 %
Lymphs Abs: 1.6 10*3/uL (ref 0.7–4.0)
MCH: 33.2 pg (ref 26.0–34.0)
MCHC: 34.5 g/dL (ref 30.0–36.0)
MCV: 96.4 fL (ref 80.0–100.0)
Monocytes Absolute: 0.6 10*3/uL (ref 0.1–1.0)
Monocytes Relative: 10 %
Neutro Abs: 3.6 10*3/uL (ref 1.7–7.7)
Neutrophils Relative %: 61 %
Platelet Count: 218 10*3/uL (ref 150–400)
RBC: 3.34 MIL/uL — ABNORMAL LOW (ref 3.87–5.11)
RDW: 17.4 % — ABNORMAL HIGH (ref 11.5–15.5)
WBC Count: 5.9 10*3/uL (ref 4.0–10.5)
nRBC: 0 % (ref 0.0–0.2)

## 2021-01-09 LAB — CMP (CANCER CENTER ONLY)
ALT: 29 U/L (ref 0–44)
AST: 42 U/L — ABNORMAL HIGH (ref 15–41)
Albumin: 3.8 g/dL (ref 3.5–5.0)
Alkaline Phosphatase: 72 U/L (ref 38–126)
Anion gap: 13 (ref 5–15)
BUN: 24 mg/dL — ABNORMAL HIGH (ref 8–23)
CO2: 28 mmol/L (ref 22–32)
Calcium: 10.4 mg/dL — ABNORMAL HIGH (ref 8.9–10.3)
Chloride: 94 mmol/L — ABNORMAL LOW (ref 98–111)
Creatinine: 0.75 mg/dL (ref 0.44–1.00)
GFR, Estimated: >60 mL/min (ref >60)
Glucose, Bld: 100 mg/dL — ABNORMAL HIGH (ref 70–99)
Potassium: 2.9 mmol/L — ABNORMAL LOW (ref 3.5–5.1)
Sodium: 135 mmol/L (ref 135–145)
Total Bilirubin: 0.6 mg/dL (ref 0.3–1.2)
Total Protein: 7.6 g/dL (ref 6.5–8.1)

## 2021-01-09 LAB — MAGNESIUM: Magnesium: 1.7 mg/dL (ref 1.7–2.4)

## 2021-01-09 LAB — CEA (IN HOUSE-CHCC): CEA (CHCC-In House): 1.17 ng/mL (ref 0.00–5.00)

## 2021-01-09 MED ORDER — SODIUM CHLORIDE 0.9 % IV SOLN
Freq: Once | INTRAVENOUS | Status: AC
Start: 2021-01-09 — End: 2021-01-09

## 2021-01-09 MED ORDER — SODIUM CHLORIDE 0.9 % IV SOLN
6.0000 mg/kg | Freq: Once | INTRAVENOUS | Status: AC
  Administered 2021-01-09: 500 mg via INTRAVENOUS
  Filled 2021-01-09: qty 20

## 2021-01-09 MED ORDER — SODIUM CHLORIDE 0.9% FLUSH
10.0000 mL | INTRAVENOUS | Status: DC | PRN
  Administered 2021-01-09: 10 mL

## 2021-01-09 MED ORDER — UREA 10 % EX CREA: TOPICAL_CREAM | Freq: Two times a day (BID) | CUTANEOUS | 2 refills | Status: DC

## 2021-01-09 MED ORDER — POTASSIUM CHLORIDE CRYS ER 20 MEQ PO TBCR: 20.0000 meq | EXTENDED_RELEASE_TABLET | Freq: Once | ORAL | Status: DC

## 2021-01-09 MED ORDER — SODIUM CHLORIDE 0.9% FLUSH
10.0000 mL | Freq: Once | INTRAVENOUS | Status: AC
  Administered 2021-01-09: 10 mL

## 2021-01-09 MED ORDER — POTASSIUM CHLORIDE CRYS ER 20 MEQ PO TBCR
20.0000 meq | EXTENDED_RELEASE_TABLET | Freq: Once | ORAL | Status: AC
  Administered 2021-01-09: 20 meq via ORAL
  Filled 2021-01-09: qty 1

## 2021-01-09 MED ORDER — MAGIC MOUTHWASH W/LIDOCAINE: 5.0000 mL | Freq: Four times a day (QID) | ORAL | 2 refills | Status: DC

## 2021-01-09 MED ORDER — HEPARIN SOD (PORK) LOCK FLUSH 100 UNIT/ML IV SOLN
500.0000 [IU] | Freq: Once | INTRAVENOUS | Status: AC | PRN
  Administered 2021-01-09: 500 [IU]

## 2021-01-09 NOTE — Patient Instructions (Signed)
[facility] ONCOLOG[facility]  Discharge Instructions: Thank you for choosing [facility] to provide your oncology and hematology care.   If you have a lab appointment with the [facility], please go directly to the [facility] and check in at the registration area.   Wear comfortable clothing and clothing appropriate for easy access to any Portacath or PICC line.   We strive to give you quality time with your provider. You may need to reschedule your appointment if you arrive late (15 or more minutes).  Arriving late affects you and other patients whose appointments are after yours.  Also, if you miss three or more appointments without notifying the office, you may be dismissed from the clinic at the provider's discretion.      For prescription refill requests, have your pharmacy contact our office and allow 72 hours for refills to be completed.    Today you received the following chemotherapy and/or immunotherapy agents Vectibix      To help prevent nausea and vomiting after your treatment, we encourage you to take your nausea medication as directed.  BELOW ARE SYMPTOMS THAT SHOULD BE REPORTED IMMEDIATELY: *FEVER GREATER THAN 100.4 F (38 C) OR HIGHER *CHILLS OR SWEATING *NAUSEA AND VOMITING THAT IS NOT CONTROLLED WITH YOUR NAUSEA MEDICATION *UNUSUAL SHORTNESS OF BREATH *UNUSUAL BRUISING OR BLEEDING *URINARY PROBLEMS (pain or burning when urinating, or frequent urination) *BOWEL PROBLEMS (unusual diarrhea, constipation, pain near the anus) TENDERNESS IN MOUTH AND THROAT WITH OR WITHOUT PRESENCE OF ULCERS (sore throat, sores in mouth, or a toothache) UNUSUAL RASH, SWELLING OR PAIN  UNUSUAL VAGINAL DISCHARGE OR ITCHING   Items with * indicate a potential emergency and should be followed up as soon as possible or go to the Emergency Department if any problems should occur.  Please show the CHEMOTHERAPY ALERT CARD or IMMUNOTHERAPY ALERT CARD at check-in to  the Emergency Department and triage nurse.  Should you have questions after your visit or need to cancel or reschedule your appointment, please contact [facility]  Dept: [phone]  and follow the prompts.  Office hours are 8:00 a.m. to 4:30 p.m. Monday - Friday. Please note that voicemails left after 4:00 p.m. may not be returned until the following business day.  We are closed weekends and major holidays. You have access to a nurse at all times for urgent questions. Please call the main number to the clinic Dept: [phone] and follow the prompts.   For any non-urgent questions, you may also contact your provider using MyChart. We now offer e-Visits for anyone 50 and older to request care online for non-urgent symptoms. For details visit [email or web address].   Also download the MyChart app! Go to the app store, search "MyChart", open the app, select [facility], and log in with your MyChart username and password.  Due to Covid, a mask is required upon entering the hospital/clinic. If you do not have a mask, one will be given to you upon arrival. For doctor visits, patients may have 1 support person aged 71 or older with them. For treatment visits, patients cannot have anyone with them due to current Covid guidelines and our immunocompromised population.

## 2021-01-27 ENCOUNTER — Other Ambulatory Visit (HOSPITAL_COMMUNITY): Payer: Self-pay

## 2021-01-29 ENCOUNTER — Other Ambulatory Visit (HOSPITAL_COMMUNITY): Payer: Self-pay

## 2021-01-29 ENCOUNTER — Other Ambulatory Visit: Payer: Self-pay | Admitting: Hematology

## 2021-01-29 DIAGNOSIS — C189 Malignant neoplasm of colon, unspecified: Secondary | ICD-10-CM

## 2021-01-31 ENCOUNTER — Other Ambulatory Visit (HOSPITAL_COMMUNITY): Payer: Self-pay

## 2021-01-31 MED ORDER — CAPECITABINE 500 MG PO TABS
1000.0000 mg/m2 | ORAL_TABLET | Freq: Two times a day (BID) | ORAL | 1 refills | Status: DC
  Filled 2021-01-31: qty 112, 21d supply, fill #0

## 2021-02-07 ENCOUNTER — Inpatient Hospital Stay: Payer: Medicare (Managed Care)

## 2021-02-07 ENCOUNTER — Other Ambulatory Visit: Payer: Self-pay

## 2021-02-07 ENCOUNTER — Inpatient Hospital Stay: Payer: Medicare (Managed Care) | Admitting: Hematology

## 2021-02-07 ENCOUNTER — Inpatient Hospital Stay: Payer: Medicare (Managed Care) | Attending: Hematology

## 2021-02-07 VITALS — BP 129/81 | HR 71 | Temp 97.8°F | Resp 18 | Ht 66.0 in | Wt 179.4 lb

## 2021-02-07 DIAGNOSIS — G620 Drug-induced polyneuropathy: Secondary | ICD-10-CM | POA: Diagnosis not present

## 2021-02-07 DIAGNOSIS — Z7984 Long term (current) use of oral hypoglycemic drugs: Secondary | ICD-10-CM | POA: Diagnosis not present

## 2021-02-07 DIAGNOSIS — Z5112 Encounter for antineoplastic immunotherapy: Secondary | ICD-10-CM | POA: Insufficient documentation

## 2021-02-07 DIAGNOSIS — L03019 Cellulitis of unspecified finger: Secondary | ICD-10-CM | POA: Diagnosis not present

## 2021-02-07 DIAGNOSIS — C221 Intrahepatic bile duct carcinoma: Secondary | ICD-10-CM

## 2021-02-07 DIAGNOSIS — M1990 Unspecified osteoarthritis, unspecified site: Secondary | ICD-10-CM | POA: Insufficient documentation

## 2021-02-07 DIAGNOSIS — Z86010 Personal history of colonic polyps: Secondary | ICD-10-CM | POA: Diagnosis not present

## 2021-02-07 DIAGNOSIS — C189 Malignant neoplasm of colon, unspecified: Secondary | ICD-10-CM

## 2021-02-07 DIAGNOSIS — C787 Secondary malignant neoplasm of liver and intrahepatic bile duct: Secondary | ICD-10-CM | POA: Diagnosis present

## 2021-02-07 DIAGNOSIS — C7802 Secondary malignant neoplasm of left lung: Secondary | ICD-10-CM | POA: Diagnosis not present

## 2021-02-07 DIAGNOSIS — C19 Malignant neoplasm of rectosigmoid junction: Secondary | ICD-10-CM | POA: Diagnosis not present

## 2021-02-07 DIAGNOSIS — Z95828 Presence of other vascular implants and grafts: Secondary | ICD-10-CM

## 2021-02-07 DIAGNOSIS — R011 Cardiac murmur, unspecified: Secondary | ICD-10-CM | POA: Insufficient documentation

## 2021-02-07 DIAGNOSIS — I10 Essential (primary) hypertension: Secondary | ICD-10-CM | POA: Insufficient documentation

## 2021-02-07 DIAGNOSIS — Z87891 Personal history of nicotine dependence: Secondary | ICD-10-CM | POA: Diagnosis not present

## 2021-02-07 DIAGNOSIS — C778 Secondary and unspecified malignant neoplasm of lymph nodes of multiple regions: Secondary | ICD-10-CM | POA: Insufficient documentation

## 2021-02-07 DIAGNOSIS — T451X5D Adverse effect of antineoplastic and immunosuppressive drugs, subsequent encounter: Secondary | ICD-10-CM | POA: Insufficient documentation

## 2021-02-07 DIAGNOSIS — L271 Localized skin eruption due to drugs and medicaments taken internally: Secondary | ICD-10-CM | POA: Diagnosis not present

## 2021-02-07 DIAGNOSIS — Z7189 Other specified counseling: Secondary | ICD-10-CM

## 2021-02-07 DIAGNOSIS — E1140 Type 2 diabetes mellitus with diabetic neuropathy, unspecified: Secondary | ICD-10-CM | POA: Insufficient documentation

## 2021-02-07 LAB — CMP (CANCER CENTER ONLY)
ALT: 26 U/L (ref 0–44)
AST: 26 U/L (ref 15–41)
Albumin: 4.1 g/dL (ref 3.5–5.0)
Alkaline Phosphatase: 74 U/L (ref 38–126)
Anion gap: 10 (ref 5–15)
BUN: 30 mg/dL — ABNORMAL HIGH (ref 8–23)
CO2: 21 mmol/L — ABNORMAL LOW (ref 22–32)
Calcium: 9.6 mg/dL (ref 8.9–10.3)
Chloride: 105 mmol/L (ref 98–111)
Creatinine: 0.97 mg/dL (ref 0.44–1.00)
GFR, Estimated: >60 mL/min (ref >60)
Glucose, Bld: 96 mg/dL (ref 70–99)
Potassium: 5.1 mmol/L (ref 3.5–5.1)
Sodium: 136 mmol/L (ref 135–145)
Total Bilirubin: 0.5 mg/dL (ref 0.3–1.2)
Total Protein: 8.3 g/dL — ABNORMAL HIGH (ref 6.5–8.1)

## 2021-02-07 LAB — CBC WITH DIFFERENTIAL (CANCER CENTER ONLY)
Abs Immature Granulocytes: 0.01 10*3/uL (ref 0.00–0.07)
Basophils Absolute: 0 10*3/uL (ref 0.0–0.1)
Basophils Relative: 1 %
Eosinophils Absolute: 0.1 10*3/uL (ref 0.0–0.5)
Eosinophils Relative: 2 %
HCT: 33.4 % — ABNORMAL LOW (ref 36.0–46.0)
Hemoglobin: 11.8 g/dL — ABNORMAL LOW (ref 12.0–15.0)
Immature Granulocytes: 0 %
Lymphocytes Relative: 19 %
Lymphs Abs: 1.1 10*3/uL (ref 0.7–4.0)
MCH: 34.5 pg — ABNORMAL HIGH (ref 26.0–34.0)
MCHC: 35.3 g/dL (ref 30.0–36.0)
MCV: 97.7 fL (ref 80.0–100.0)
Monocytes Absolute: 0.5 10*3/uL (ref 0.1–1.0)
Monocytes Relative: 9 %
Neutro Abs: 4 10*3/uL (ref 1.7–7.7)
Neutrophils Relative %: 69 %
Platelet Count: 268 10*3/uL (ref 150–400)
RBC: 3.42 MIL/uL — ABNORMAL LOW (ref 3.87–5.11)
RDW: 16.5 % — ABNORMAL HIGH (ref 11.5–15.5)
WBC Count: 5.7 10*3/uL (ref 4.0–10.5)
nRBC: 0 % (ref 0.0–0.2)

## 2021-02-07 LAB — MAGNESIUM: Magnesium: 2.1 mg/dL (ref 1.7–2.4)

## 2021-02-07 LAB — CEA (IN HOUSE-CHCC): CEA (CHCC-In House): <1 ng/mL (ref 0.00–5.00)

## 2021-02-07 MED ORDER — SODIUM CHLORIDE 0.9 % IV SOLN: Freq: Once | INTRAVENOUS | Status: AC

## 2021-02-07 MED ORDER — SODIUM CHLORIDE 0.9 % IV SOLN
6.0000 mg/kg | Freq: Once | INTRAVENOUS | Status: AC
  Administered 2021-02-07: 500 mg via INTRAVENOUS
  Filled 2021-02-07: qty 20

## 2021-02-07 MED ORDER — POTASSIUM CHLORIDE CRYS ER 20 MEQ PO TBCR: 20.0000 meq | EXTENDED_RELEASE_TABLET | Freq: Two times a day (BID) | ORAL | 1 refills | Status: DC

## 2021-02-07 MED ORDER — SODIUM CHLORIDE 0.9% FLUSH
10.0000 mL | INTRAVENOUS | Status: DC | PRN
  Administered 2021-02-07: 10 mL

## 2021-02-07 MED ORDER — SODIUM CHLORIDE 0.9% FLUSH
10.0000 mL | Freq: Once | INTRAVENOUS | Status: AC
  Administered 2021-02-07: 10 mL

## 2021-02-07 MED ORDER — HEPARIN SOD (PORK) LOCK FLUSH 100 UNIT/ML IV SOLN
500.0000 [IU] | Freq: Once | INTRAVENOUS | Status: AC | PRN
  Administered 2021-02-07: 500 [IU]

## 2021-02-07 MED ORDER — MAGNESIUM SULFATE 4 GM/100ML IV SOLN
4.0000 g | Freq: Once | INTRAVENOUS | Status: AC
  Administered 2021-02-07: 4 g via INTRAVENOUS
  Filled 2021-02-07: qty 100

## 2021-02-07 NOTE — Patient Instructions (Signed)
[facility]COL[facility]  Discharge Instructions: Thank you for choosing [facility] to provide your oncology and hematology care.   If you have a lab appointment with the [facility], please go directly to the [facility] and check in at the registration area.   Wear comfortable clothing and clothing appropriate for easy access to any Portacath or PICC line.   We strive to give you quality time with your provider. You may need to reschedule your appointment if you arrive late (15 or more minutes).  Arriving late affects you and other patients whose appointments are after yours.  Also, if you miss three or more appointments without notifying the office, you may be dismissed from the clinic at the provider's discretion.      For prescription refill requests, have your pharmacy contact our office and allow 72 hours for refills to be completed.    Today you received the following chemotherapy and/or immunotherapy agents: Vectibix     To help prevent nausea and vomiting after your treatment, we encourage you to take your nausea medication as directed.  BELOW ARE SYMPTOMS THAT SHOULD BE REPORTED IMMEDIATELY: *FEVER GREATER THAN 100.4 F (38 C) OR HIGHER *CHILLS OR SWEATING *NAUSEA AND VOMITING THAT IS NOT CONTROLLED WITH YOUR NAUSEA MEDICATION *UNUSUAL SHORTNESS OF BREATH *UNUSUAL BRUISING OR BLEEDING *URINARY PROBLEMS (pain or burning when urinating, or frequent urination) *BOWEL PROBLEMS (unusual diarrhea, constipation, pain near the anus) TENDERNESS IN MOUTH AND THROAT WITH OR WITHOUT PRESENCE OF ULCERS (sore throat, sores in mouth, or a toothache) UNUSUAL RASH, SWELLING OR PAIN  UNUSUAL VAGINAL DISCHARGE OR ITCHING   Items with * indicate a potential emergency and should be followed up as soon as possible or go to the Emergency Department if any problems should occur.  Please show the CHEMOTHERAPY ALERT CARD or IMMUNOTHERAPY ALERT CARD at check-in to  the Emergency Department and triage nurse.  Should you have questions after your visit or need to cancel or reschedule your appointment, please contact [facility]  Dept: [phone]  and follow the prompts.  Office hours are 8:00 a.m. to 4:30 p.m. Monday - Friday. Please note that voicemails left after 4:00 p.m. may not be returned until the following business day.  We are closed weekends and major holidays. You have access to a nurse at all times for urgent questions. Please call the main number to the clinic Dept: [phone] and follow the prompts.   For any non-urgent questions, you may also contact your provider using MyChart. We now offer e-Visits for anyone 11 and older to request care online for non-urgent symptoms. For details visit [email or web address].   Also download the MyChart app! Go to the app store, search "MyChart", open the app, select , and log in with your MyChart username and password.  Due to Covid, a mask is required upon entering the hospital/clinic. If you do not have a mask, one will be given to you upon arrival. For doctor visits, patients may have 1 support person aged 26 or older with them. For treatment visits, patients cannot have anyone with them due to current Covid guidelines and our immunocompromised population.

## 2021-02-07 NOTE — Progress Notes (Signed)
Lindsay [name]   Telephone:(336) [phone] Fax:(336) [phone]   Clinic Follow up Note   Patient Care Team: Lindsay Panda, MD as PCP - General (Internal Medicine) Lindsay Finner, RN (Inactive) as Oncology Nurse Navigator Lindsay Merle, MD as Consulting Physician (Oncology) Lindsay Ada, MD as Consulting Physician (Gastroenterology)  Date of Service:  02/07/2021  CHIEF COMPLAINT: f/u of metastatic colon cancer  CURRENT THERAPY:  First line FOLFOX starting 01/25/2020, Vextibix added with C2 (02/06/20), oxaliplatin stopped after September 18, 2020 due to infusion reaction.  5-FU infusion changed to Xeloda after 10/02/20. first cycle she took 1552m bid for 14 days, dose increased from cycle 2 and to full dose from cycle 3, dose reduced back to 15045mbid for 14 days on 01/09/2021 due to skin toxicity   ASSESSMENT & PLAN:  Lindsay [name] a 89.o. female with   1. 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 MSS, [name]/[name] wildtype -Her 12/15/19 MRI abdomen showed 2 large liver masses indicating metastatic disease and bulky porta hepatis and retroperitoneal adenopathy. -Her Lindsay [name] from 12/22/19 showed 3.5cm LN in porta hepatis region and biopsy confirmed metastatic adenocarcinoma.  Preliminary cytology sample is not adequate for any additional IHC study for the origin of tumor, the morphology does not support HCC. -Her EGD was negative for malignancy.  Her colonoscopy in March 2021 did show a large 2 cm polyps in descending colon, biopsy showed high-grade dysplasia.   -Liver biopsy confirmed adenocarcinoma, immunostain showed positive CDX2 and CK20, negative CK7, supporting primary colorectal cancer -PET scan showed known liver metastasis, and diffuse thoracic and abdominal adenopathy. There is a hypermetabolic 1.8 cm pulmonary nodule in left upper lobe, and focal hypermetabolic lesion in the splenic fixture of the colon, concerning for primary tumor. -She underwent repeat colonoscopy  and biopsy by Lindsay. HuBenson Norwayadditional IHC testing with TTF-1 shows focal staining, which can be seen in 5-10% of colorectal primary cancers as well as lung cancer. However given the above, the overall picture is still consistent with colorectal primary.  -She began first line systemic FOLFOX on 11/18, goal is palliative. Tolerating well  -FO shows [name]/[name] wild-type, she is a candidate for EGFR inhibitor Panitumumab which was added with C2 -Restaging CT's 04/01/2020 and 06/20/20 showed interval reduction in size of liver, lung, and nodal metastases.  CEA normalized as of 04/16/2020 -Oxaliplatin was stopped after cycle 18 due to reaction.  She also had vasovagal like reaction after Vectibix, before 5-FU on 08/02/2020 but no issues on 10/02/2020.  Due to skin cracking and concern for 5-FU infusion reactions she was switched to Xeloda.  She has developed significant hand-foot syndrome, improved with dose reduction last cycle     2.  Grade 1 neuropathy  -S/p cycle 5, oxaliplatin dose reduced and eventually discontinued after reaction 09/18/20 -Persistent neuropathy at the feet and fingertips, mild   3. Chronic Lower abdominal pain, Nausea -She has had chronic low abdominal pain for 5 years ranging up to 4 times a week. She notes her pain worsened in the last 3 years, but no work-up until 2021.   -Her pain is 5/10 at most but when high not managed on Motrin.  -she has tramadol but does not take it much -encouraged her to use Ensure/boost if she has decreased appetite  -f/up with dietician -abdominal pain improved on chemo.  mild an intermittent. Does not require medication    4. HTN, DM, Heart Murmur, Arthritis -Per PCP and Cardiologist Lindsay GaEinar GipDM controlled,  no pre-existing neuropathy   5. 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 -She lives with husband and has brothers and sister in and out of town. Her only child passed in recent years.   6. Goals of care -we  discussed the incurable but treatable nature of her stage IV cancer and general prognosis. She understands the prognosis is poor if she does not tolerate or respond well to chemo -she understands the goal is palliative -full code    7. Skin and nail changes, paronychia, hand-foot syndrome -secondary to 5-FU and worse on Xeloda -she took an extra week off the Xeloda and was able to recover well.   8. 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   SUMMARY OF ONCOLOGIC HISTORY: Oncology History Overview Note  Cancer Staging No matching staging information was found for the patient.    metastatic colon cancer to liver  06/20/2019 Procedure   Colonoscopy by Lindsay MRush Landmark4/13/21  IMPRESSION -Seven 3 to 10 mm polyps in the sigmoid colon, in the transverse colon and in the escending colon, removed with a cold snare. Resected and retrireved.  -One 212mpolyp in the descending colon. Biopsies. Tattoes.  -Mediaum sized lipoma in the ascending colon.   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 See Comment B. Colon, Ascending, polyp, Polypectomy:  -TUBULAR ADENOMA -No high grade dysplasia or malignancy.  C. Colon, TRansverse, Polyo, polectomy:  -TUBULAR ADENOMA -No high grade dysplasia or malignancy.  D. Colon, Sigmoid, Polyp, Polypectomy:  -HYPERPLASTIC POLYP   11/07/2019 Imaging   US[place]oreabdomen 11/07/19  IMPRESSION: 1. Two solid masses in the liver are nonspecific. Recommend MRI abdomen with and  without contrast for further evaluation.   12/15/2019 Imaging   MRI Abdomen 12/15/19  IMPRESSION: 1. There are two large masses in the liver with appearance favoring metastatic disease or hepatocellular carcinoma or cholangiocarcinoma. A benign etiology is highly unlikely given the enhancement pattern and associated adenopathy. 2. Considerable porta hepatis and retroperitoneal adenopathy. Some of the confluent porta hepatis tumor is potentially infiltrative and abuts the pancreatic body along its right upper margin, making it difficult to completely exclude the possibility of pancreatic adenocarcinoma primary. Possibilities helpful in further workup might include tissue diagnosis, endoscopic ultrasound, or nuclear medicine PET-CT. 3. Pancreas divisum. 4. Lumbar spondylosis and degenerative disc disease. 5. Despite efforts by the technologist and patient, motion artifact is present on today's exam and could not be eliminated. This reduces exam sensitivity and specificity.   12/22/2019 Procedure   Upper Endoscopy by Lindsay HuBenson Norway0/15/21  IMPRESSION - One lymph node was visualized and measured in the porta hepatis region. Fine needle aspiration performed    12/22/2019 Initial Biopsy   A. LIVER, PORTA HEPATIS MASS, FINE NEEDLE 12/22/19 ASPIRATION:  FINAL MICROSCOPIC DIAGNOSIS:  - Malignant cells consistent with metastatic adenocarcinoma   01/01/2020 Initial Diagnosis   [place]atic [place]angiocarcinoma ([facility]  01/08/2020 Initial Biopsy   FINAL MICROSCOPIC DIAGNOSIS:   A. LIVER, LEFT LOBE, BIOPSY:  -  Metastatic adenocarcinoma, consistent with a colorectal primary.  See  comment      COMMENT:   Immunohistochemical stains show the tumor cells are positive for CK20  and CDX2 but negative for CK7, consistent with above interpretation.  Lindsay. Burr Medico was notified on 01/10/2020   01/08/2020 Genetic Testing   [name]  [name] wildtype and [name]/[name] mutations which make her eligible for  target biological agent Vectibix.   01/24/2020 Procedure   PAC placed 01/24/20   01/25/2020 -  Chemotherapy   first line FOLFOX starting 01/25/2020, Vextibix  added with C2 (02/06/20)   06/20/2020 Imaging   CT CAP  IMPRESSION: 1. Continued interval reduction in size and conspicuity of a subsolid nodule of the peripheral left upper lobe. 2. Unchanged prominent pretracheal and subcarinal lymph nodes. 3. Redemonstrated partially calcified low-attenuation liver masses, slightly decreased in size compared to prior examination. 4. Slight interval decrease in size of a portacaval lymph node or conglomerate and retroperitoneal lymph nodes. 5. Findings are consistent with continued treatment response of nodal, pulmonary, and hepatic metastatic disease. 6. Coronary artery disease.   Aortic Atherosclerosis (ICD10-I70.0).     10/09/2020 Imaging   IMPRESSION: 1. Slight decrease in size of dominant liver mass with smaller liver mass within 1-2 mm of prior measurement. 2. Stable appearance of celiac lymph and retroperitoneal lymph nodes. Dominant node with calcification in the gastrohepatic ligament as described. 3. Continued decrease in size of LEFT upper lobe nodule. 4. Three-vessel coronary artery calcification. 5. Aortic atherosclerosis.      INTERVAL HISTORY:  Lindsay [name] is here for a follow up of metastatic colon cancer. She was last seen by NP Lacie on 01/09/21. She presents to the clinic alone. She reports she experienced mouth/lip swelling. She notes this has happened before and has previously used Benadryl for relief. She report this time, the benadryl did not relieve the swelling.   All other systems were reviewed with the patient and are negative.  MEDICAL HISTORY:  Past Medical History:  Diagnosis Date   Arthritis    Diabetes mellitus without complication ([place])    Hypertension    met colon ca to liver 01/2020    SURGICAL HISTORY: Past Surgical History:  Procedure  Laterality Date   ABDOMINAL HYSTERECTOMY     ANTERIOR AND POSTERIOR REPAIR WITH SACROSPINOUS FIXATION N/A 07/16/2016   Procedure: ANTERIOR AND POSTERIOR REPAIR WITH SACROSPINOUS FIXATION;  Surgeon: Everlene Farrier, MD;  Location: [facility];  Service: Gynecology;  Laterality: N/A;   CYSTOSCOPY  07/16/2016   Procedure: CYSTOSCOPY;  Surgeon: Everlene Farrier, MD;  Location: [facility];  Service: Gynecology;;   ESOPHAGOGASTRODUODENOSCOPY (EGD) WITH PROPOFOL N/A 12/22/2019   Procedure: ESOPHAGOGASTRODUODENOSCOPY (EGD) WITH PROPOFOL;  Surgeon: Lindsay Ada, MD;  Location: [facility];  Service: Endoscopy;  Laterality: N/A;   FINE NEEDLE ASPIRATION N/A 12/22/2019   Procedure: FINE NEEDLE ASPIRATION (FNA) LINEAR;  Surgeon: Lindsay Ada, MD;  Location: [facility];  Service: Endoscopy;  Laterality: N/A;   IR IMAGING GUIDED PORT INSERTION  01/24/2020   LAPAROSCOPIC VAGINAL HYSTERECTOMY WITH SALPINGECTOMY Bilateral 07/16/2016   Procedure: LAPAROSCOPIC ASSISTED VAGINAL HYSTERECTOMY WITH SALPINGECTOMY;  Surgeon: Everlene Farrier, MD;  Location: [facility];  Service: Gynecology;  Laterality: Bilateral;   MYOMECTOMY ABDOMINAL APPROACH     THYROID SURGERY     tyroid     UPPER ESOPHAGEAL ENDOSCOPIC ULTRASOUND (Lindsay [name]) N/A 12/22/2019   Procedure: UPPER ESOPHAGEAL ENDOSCOPIC ULTRASOUND (Lindsay [name]);  Surgeon: Lindsay Ada, MD;  Location: [facility];  Service: Endoscopy;  Laterality: N/A;  I have reviewed the social history and family history with the patient and they are unchanged from previous note.  ALLERGIES:  is allergic to oxaliplatin.  MEDICATIONS:  Current Outpatient Medications  Medication Sig Dispense Refill   aspirin EC 81 MG tablet Take 1 tablet (81 mg total) by mouth daily. 90 tablet 3   atorvastatin (LIPITOR) 20 MG tablet Take 20 mg by mouth at bedtime.   1   capecitabine (XELODA) 500 MG tablet Take 4 tablets (2,000 mg total) by mouth 2 (two) times daily after a meal. Take for 14 days, followed by 7 days off.  Repeat every 21 days. 112 tablet 1   clindamycin (CLINDAGEL) 1 % gel Apply topically 2 (two) times daily. 60 g 2   clobetasol cream (TEMOVATE) 3.71 % Apply 1 application topically daily.     doxycycline (VIBRA-TABS) 100 MG tablet Take 1 tablet (100 mg total) by mouth 2 (two) times daily. 60 tablet 1   lidocaine-prilocaine (EMLA) cream Apply 1 application topically as needed. 30 g 1   lisinopril-hydrochlorothiazide (ZESTORETIC) 20-12.5 MG tablet Take 1 tablet by mouth daily with breakfast.      magic mouthwash w/lidocaine SOLN Take 5 mLs by mouth 4 (four) times daily. 475 mL 2   magnesium oxide (MAG-OX) 400 (241.3 Mg) MG tablet Take 1 tablet (400 mg total) by mouth in the morning, at noon, and at bedtime. 90 tablet 2   magnesium oxide (MAG-OX) 400 MG tablet Take 2 tablets (800 mg total) by mouth 3 (three) times daily. 180 tablet 2   ondansetron (ZOFRAN) 8 MG tablet TAKE 1 TABLET BY MOUTH EVERY 8 HOURS AS NEEDED FOR NAUSEA OR VOMITING 20 tablet 0   prochlorperazine (COMPAZINE) 10 MG tablet Take 1 tablet (10 mg total) by mouth every 6 (six) hours as needed for nausea or vomiting. 30 tablet 2   spironolactone (ALDACTONE) 25 MG tablet Take 25 mg by mouth daily with breakfast.     urea (CARMOL) 10 % cream Apply topically 2 (two) times daily. 71 g 2   valACYclovir (VALTREX) 1000 MG tablet Take 1 tablet (1,000 mg total) by mouth 2 (two) times daily. 10 tablet 0   No current facility-administered medications for this visit.    PHYSICAL EXAMINATION: ECOG PERFORMANCE STATUS: 1 - Symptomatic but completely ambulatory  Vitals:   02/07/21 1238  BP: 129/81  Pulse: 71  Resp: 18  Temp: 97.8 F (36.6 C)  SpO2: 97%   Wt Readings from Last 3 Encounters:  02/07/21 179 lb 6.4 oz (81.4 kg)  01/09/21 180 lb 7 oz (81.8 kg)  12/20/20 182 lb 12.8 oz (82.9 kg)     GENERAL:alert, no distress and comfortable SKIN: skin color normal, no rashes or significant lesions except darkness and dry skin on palms,  (+)  lip swelling (see photo) EYES: normal, Conjunctiva are pink and non-injected, sclera clear  NEURO: alert & oriented x 3 with fluent speech     LABORATORY DATA:  I have reviewed the data as listed CBC Latest Ref Rng & Units 01/09/2021 12/20/2020 11/29/2020  WBC 4.0 - 10.5 K/uL 5.9 5.9 5.0  Hemoglobin 12.0 - 15.0 g/dL 11.1(L) 11.8(L) 12.1  Hematocrit 36.0 - 46.0 % 32.2(L) 33.9(L) 34.6(L)  Platelets 150 - 400 K/uL 218 225 202     CMP Latest Ref Rng & Units 01/09/2021 12/20/2020 11/29/2020  Glucose 70 - 99 mg/dL 100(H) 109(H) 118(H)  BUN 8 - 23 mg/dL 24(H) 19 23  Creatinine 0.44 - 1.00 mg/dL 0.75  0.81 0.83  Sodium 135 - 145 mmol/L 135 133(L) 136  Potassium 3.5 - 5.1 mmol/L 2.9(L) 3.6 4.1  Chloride 98 - 111 mmol/L 94(L) 97(L) 99  CO2 22 - 32 mmol/L 28 26 27   Calcium 8.9 - 10.3 mg/dL 10.4(H) 9.3 10.4(H)  Total Protein 6.5 - 8.1 g/dL 7.6 8.2(H) 8.0  Total Bilirubin 0.3 - 1.2 mg/dL 0.6 0.6 0.4  Alkaline Phos 38 - 126 U/L 72 81 94  AST 15 - 41 U/L 42(H) 42(H) 33  ALT 0 - 44 U/L 29 35 30      RADIOGRAPHIC STUDIES: I have personally reviewed the radiological images as listed and agreed with the findings in the report. No results found.    No orders of the defined types were placed in this encounter.  All questions were answered. The patient knows to call the clinic with any problems, questions or concerns. No barriers to learning was detected. The total time spent in the appointment was 30 minutes.     Lindsay Merle, MD 02/07/2021   I, Wilburn Mylar, am acting as scribe for Lindsay Merle, MD.   I have reviewed the above documentation for accuracy and completeness, and I agree with the above.

## 2021-02-09 ENCOUNTER — Encounter: Payer: Self-pay | Admitting: Hematology

## 2021-02-10 ENCOUNTER — Telehealth: Payer: Self-pay | Admitting: Hematology

## 2021-02-10 NOTE — Telephone Encounter (Signed)
Left message with follow-up appointment per 12/2 los.

## 2021-02-11 ENCOUNTER — Telehealth: Payer: Self-pay

## 2021-02-11 NOTE — Telephone Encounter (Signed)
Spoke with Dr Edison Nasuti RN regarding this pt.  Dr. Burr Medico wants to speak with the pt's PCP regarding the pt's lip edema.  Asked Dr. Adela Ports RN if he could please contact Dr. Burr Medico on her cell phone regarding this pt.  Dr. Adela Ports Rn stated she will let Dr. Mellody Drown know and he will give Dr. Burr Medico a call today.

## 2021-02-12 ENCOUNTER — Encounter: Payer: Self-pay | Admitting: Hematology

## 2021-02-14 ENCOUNTER — Other Ambulatory Visit: Payer: Self-pay

## 2021-02-14 ENCOUNTER — Ambulatory Visit (HOSPITAL_COMMUNITY)
Admission: RE | Admit: 2021-02-14 | Discharge: 2021-02-14 | Disposition: A | Discharge status: Home / Self Care | Payer: Medicare (Managed Care) | Source: Ambulatory Visit | Attending: Nurse Practitioner | Admitting: Nurse Practitioner

## 2021-02-14 DIAGNOSIS — C221 Intrahepatic bile duct carcinoma: Secondary | ICD-10-CM | POA: Diagnosis present

## 2021-02-14 MED ORDER — SODIUM CHLORIDE (PF) 0.9 % IJ SOLN
INTRAMUSCULAR | Status: AC
  Filled 2021-02-14: qty 50

## 2021-02-14 MED ORDER — IOHEXOL 350 MG/ML SOLN
80.0000 mL | Freq: Once | INTRAVENOUS | Status: AC | PRN
  Administered 2021-02-14: 80 mL via INTRAVENOUS

## 2021-02-19 ENCOUNTER — Other Ambulatory Visit: Payer: Self-pay

## 2021-02-19 ENCOUNTER — Other Ambulatory Visit (HOSPITAL_COMMUNITY): Payer: Self-pay

## 2021-02-19 ENCOUNTER — Telehealth: Payer: Self-pay

## 2021-02-19 NOTE — Telephone Encounter (Signed)
LVM stating pt's recent CT Scan for restaging was stable and has no signs of progression per Dr. Burr Medico and Cira Rue, NP.  Instructed pt to feel free to contact Dr. Ernestina Penna and Regan Rakers Burton's office should she have further questions or concerns.

## 2021-03-05 ENCOUNTER — Inpatient Hospital Stay (HOSPITAL_BASED_OUTPATIENT_CLINIC_OR_DEPARTMENT_OTHER): Payer: Medicare (Managed Care) | Admitting: Hematology

## 2021-03-05 ENCOUNTER — Inpatient Hospital Stay: Payer: Medicare (Managed Care)

## 2021-03-05 ENCOUNTER — Other Ambulatory Visit (HOSPITAL_COMMUNITY): Payer: Self-pay

## 2021-03-05 ENCOUNTER — Other Ambulatory Visit: Payer: Self-pay

## 2021-03-05 DIAGNOSIS — C189 Malignant neoplasm of colon, unspecified: Secondary | ICD-10-CM

## 2021-03-05 DIAGNOSIS — C221 Intrahepatic bile duct carcinoma: Secondary | ICD-10-CM

## 2021-03-05 DIAGNOSIS — Z5112 Encounter for antineoplastic immunotherapy: Secondary | ICD-10-CM | POA: Diagnosis not present

## 2021-03-05 DIAGNOSIS — Z95828 Presence of other vascular implants and grafts: Secondary | ICD-10-CM

## 2021-03-05 DIAGNOSIS — Z7189 Other specified counseling: Secondary | ICD-10-CM

## 2021-03-05 LAB — CMP (CANCER CENTER ONLY)
ALT: 25 U/L (ref 0–44)
AST: 26 U/L (ref 15–41)
Albumin: 4.4 g/dL (ref 3.5–5.0)
Alkaline Phosphatase: 60 U/L (ref 38–126)
Anion gap: 7 (ref 5–15)
BUN: 22 mg/dL (ref 8–23)
CO2: 26 mmol/L (ref 22–32)
Calcium: 10.4 mg/dL — ABNORMAL HIGH (ref 8.9–10.3)
Chloride: 103 mmol/L (ref 98–111)
Creatinine: 0.8 mg/dL (ref 0.44–1.00)
GFR, Estimated: >60 mL/min (ref >60)
Glucose, Bld: 104 mg/dL — ABNORMAL HIGH (ref 70–99)
Potassium: 4.7 mmol/L (ref 3.5–5.1)
Sodium: 136 mmol/L (ref 135–145)
Total Bilirubin: 0.6 mg/dL (ref 0.3–1.2)
Total Protein: 8.3 g/dL — ABNORMAL HIGH (ref 6.5–8.1)

## 2021-03-05 LAB — CBC WITH DIFFERENTIAL (CANCER CENTER ONLY)
Abs Immature Granulocytes: 0.01 10*3/uL (ref 0.00–0.07)
Basophils Absolute: 0 10*3/uL (ref 0.0–0.1)
Basophils Relative: 0 %
Eosinophils Absolute: 0.1 10*3/uL (ref 0.0–0.5)
Eosinophils Relative: 2 %
HCT: 33.2 % — ABNORMAL LOW (ref 36.0–46.0)
Hemoglobin: 11.3 g/dL — ABNORMAL LOW (ref 12.0–15.0)
Immature Granulocytes: 0 %
Lymphocytes Relative: 24 %
Lymphs Abs: 1.3 10*3/uL (ref 0.7–4.0)
MCH: 34.7 pg — ABNORMAL HIGH (ref 26.0–34.0)
MCHC: 34 g/dL (ref 30.0–36.0)
MCV: 101.8 fL — ABNORMAL HIGH (ref 80.0–100.0)
Monocytes Absolute: 0.6 10*3/uL (ref 0.1–1.0)
Monocytes Relative: 12 %
Neutro Abs: 3.2 10*3/uL (ref 1.7–7.7)
Neutrophils Relative %: 62 %
Platelet Count: 213 10*3/uL (ref 150–400)
RBC: 3.26 MIL/uL — ABNORMAL LOW (ref 3.87–5.11)
RDW: 15.8 % — ABNORMAL HIGH (ref 11.5–15.5)
WBC Count: 5.2 10*3/uL (ref 4.0–10.5)
nRBC: 0 % (ref 0.0–0.2)

## 2021-03-05 LAB — MAGNESIUM: Magnesium: 1.9 mg/dL (ref 1.7–2.4)

## 2021-03-05 LAB — CEA (IN HOUSE-CHCC): CEA (CHCC-In House): <1 ng/mL (ref 0.00–5.00)

## 2021-03-05 MED ORDER — SODIUM CHLORIDE 0.9 % IV SOLN
6.0000 mg/kg | Freq: Once | INTRAVENOUS | Status: AC
  Administered 2021-03-05: 17:00:00 500 mg via INTRAVENOUS
  Filled 2021-03-05: qty 20

## 2021-03-05 MED ORDER — CAPECITABINE 500 MG PO TABS
1500.0000 mg | ORAL_TABLET | Freq: Two times a day (BID) | ORAL | 1 refills | Status: DC
  Filled 2021-03-05: qty 84, 14d supply, fill #0
  Filled 2021-03-19: qty 84, 28d supply, fill #0

## 2021-03-05 MED ORDER — HEPARIN SOD (PORK) LOCK FLUSH 100 UNIT/ML IV SOLN
500.0000 [IU] | Freq: Once | INTRAVENOUS | Status: AC | PRN
  Administered 2021-03-05: 18:00:00 500 [IU]

## 2021-03-05 MED ORDER — SODIUM CHLORIDE 0.9 % IV SOLN: Freq: Once | INTRAVENOUS | Status: AC

## 2021-03-05 MED ORDER — MAGNESIUM SULFATE 4 GM/100ML IV SOLN
4.0000 g | Freq: Once | INTRAVENOUS | Status: AC
  Administered 2021-03-05: 15:00:00 4 g via INTRAVENOUS
  Filled 2021-03-05: qty 100

## 2021-03-05 MED ORDER — SODIUM CHLORIDE 0.9% FLUSH
10.0000 mL | INTRAVENOUS | Status: DC | PRN
  Administered 2021-03-05: 18:00:00 10 mL

## 2021-03-05 MED ORDER — SODIUM CHLORIDE 0.9% FLUSH
10.0000 mL | Freq: Once | INTRAVENOUS | Status: AC
  Administered 2021-03-05: 14:00:00 10 mL

## 2021-03-05 NOTE — Patient Instructions (Addendum)
[facility]  Discharge Instructions: Thank you for choosing [facility] to provide your oncology and hematology care.   If you have a lab appointment with the [facility], please go directly to the [facility] and check in at the registration area.   Wear comfortable clothing and clothing appropriate for easy access to any Portacath or PICC line.   We strive to give you quality time with your provider. You may need to reschedule your appointment if you arrive late (15 or more minutes).  Arriving late affects you and other patients whose appointments are after yours.  Also, if you miss three or more appointments without notifying the office, you may be dismissed from the clinic at the providers discretion.      For prescription refill requests, have your pharmacy contact our office and allow 72 hours for refills to be completed.    Today you received the following chemotherapy and/or immunotherapy agents: Panitumumab.    To help prevent nausea and vomiting after your treatment, we encourage you to take your nausea medication as directed.  BELOW ARE SYMPTOMS THAT SHOULD BE REPORTED IMMEDIATELY: *FEVER GREATER THAN 100.4 F (38 C) OR HIGHER *CHILLS OR SWEATING *NAUSEA AND VOMITING THAT IS NOT CONTROLLED WITH YOUR NAUSEA MEDICATION *UNUSUAL SHORTNESS OF BREATH *UNUSUAL BRUISING OR BLEEDING *URINARY PROBLEMS (pain or burning when urinating, or frequent urination) *BOWEL PROBLEMS (unusual diarrhea, constipation, pain near the anus) TENDERNESS IN MOUTH AND THROAT WITH OR WITHOUT PRESENCE OF ULCERS (sore throat, sores in mouth, or a toothache) UNUSUAL RASH, SWELLING OR PAIN  UNUSUAL VAGINAL DISCHARGE OR ITCHING   Items with * indicate a potential emergency and should be followed up as soon as possible or go to the Emergency Department if any problems should occur.  Please show the CHEMOTHERAPY ALERT CARD or IMMUNOTHERAPY ALERT CARD at check-in to  the Emergency Department and triage nurse.  Should you have questions after your visit or need to cancel or reschedule your appointment, please contact [facility]  Dept: [phone]  and follow the prompts.  Office hours are 8:00 a.m. to 4:30 p.m. Monday - Friday. Please note that voicemails left after 4:00 p.m. may not be returned until the following business day.  We are closed weekends and major holidays. You have access to a nurse at all times for urgent questions. Please call the main number to the clinic Dept: [phone] and follow the prompts.   For any non-urgent questions, you may also contact your provider using MyChart. We now offer e-Visits for anyone 30 and older to request care online for non-urgent symptoms. For details visit [email or web address].   Also download the MyChart app! Go to the app store, search "MyChart", open the app, select [name], and log in with your MyChart username and password.  Due to Covid, a mask is required upon entering the hospital/clinic. If you do not have a mask, one will be given to you upon arrival. For doctor visits, patients may have 1 support person aged 62 or older with them. For treatment visits, patients cannot have anyone with them due to current Covid guidelines and our immunocompromised population.

## 2021-03-05 NOTE — Progress Notes (Signed)
Per Dr. Burr Medico, administer same dose of magnesium today as last cycle.  Per Carolyne Fiscal, patient received mag 4gm last cycle.  Order released from treatment plan for mag 4gm by RN.

## 2021-03-05 NOTE — Progress Notes (Signed)
[name]   [phone] [phone]   Clinic Follow up Note   Patient Care Team: [name], MD as PCP - General (Internal Medicine) [name], RN (Inactive) as Oncology Nurse Navigator [name], MD as Consulting Physician (Oncology) [name], MD as Consulting Physician (Gastroenterology)  Date of Service:  03/05/2021  CHIEF COMPLAINT: f/u of metastatic colon cancer  CURRENT THERAPY:  First line FOLFOX starting 01/25/2020, Vextibix added with C2 (02/06/20), oxaliplatin stopped after September 18, 2020 due to infusion reaction.  5-FU infusion changed to Xeloda after 10/02/20. first cycle she took 1561m bid for 14 days, dose increased from cycle 2 and to full dose from cycle 3, dose reduced back to 15036mbid for 14 days on 01/09/2021 due to skin toxicity   ASSESSMENT & PLAN:  Lindsay GILLSs a [age].o. female with   1. 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 MSS, [name]/[name] wildtype -Her 12/15/19 MRI abdomen showed 2 large liver masses indicating metastatic disease and bulky porta hepatis and retroperitoneal adenopathy. -Her EUS from 12/22/19 showed 3.5cm LN in porta hepatis region and biopsy confirmed metastatic adenocarcinoma.  Preliminary cytology sample is not adequate for any additional IHC study for the origin of tumor, the morphology does not support HCC. -Her EGD was negative for malignancy.  Her colonoscopy in March 2021 did show a large 2 cm polyps in descending colon, biopsy showed high-grade dysplasia.   -Liver biopsy confirmed adenocarcinoma, immunostain showed positive CDX2 and CK20, negative CK7, supporting primary colorectal cancer -PET scan showed known liver metastasis, and diffuse thoracic and abdominal adenopathy. There is a hypermetabolic 1.8 cm pulmonary nodule in left upper lobe, and focal hypermetabolic lesion in the splenic fixture of the colon, concerning for primary tumor. -She underwent repeat colonoscopy  and biopsy by Dr. HuBenson Norwayadditional IHC testing with TTF-1 shows focal staining, which can be seen in 5-10% of colorectal primary cancers as well as lung cancer. However given the above, the overall picture is still consistent with colorectal primary.  -She began first line systemic FOLFOX on 11/18, goal is palliative. Tolerating well  -FO shows [name]/[name] wild-type, she is a candidate for EGFR inhibitor Panitumumab which was added with C2 -Restaging CT's 04/01/2020 and 06/20/20 showed interval reduction in size of liver, lung, and nodal metastases.  CEA normalized as of 04/16/2020 -Oxaliplatin was stopped after cycle 18 due to reaction.  She also had vasovagal-like reaction after Vectibix, before 5-FU on 08/02/20 but no issues on 10/02/20.  Due to skin cracking and concern for 5-FU infusion reactions she was switched to Xeloda.   -She has developed significant hand-foot syndrome and was reduced to 1500 mg BID 2 weeks on/1 week off on 01/20/21. She continues to have skin toxicity, so we will switch her to 7 days on/7 days off.   2. Skin and nail changes, paronychia, hand-foot syndrome -secondary to 5-FU and worse on Xeloda -she took an extra week off the Xeloda and was able to recover well.    3. Grade 1 neuropathy  -S/p cycle 5, oxaliplatin dose reduced and eventually discontinued after reaction 09/18/20 -Persistent neuropathy at the feet and fingertips, mild   4. Chronic Lower abdominal pain, Nausea -She has had chronic low abdominal pain for 5 years ranging up to 4 times a week. She notes her pain worsened in the last 3 years, but no work-up until 2021.   -Her pain is 5/10 at most but when high not managed on Motrin.  -  she has tramadol but does not take it much -encouraged her to use Ensure/boost if she has decreased appetite  -f/up with dietician -abdominal pain improved on chemo.  mild an intermittent. Does not require medication    4. HTN, DM, Heart Murmur, Arthritis -Per PCP and Cardiologist Dr  Einar Gip -DM controlled, no pre-existing neuropathy   5. 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 -She lives with husband and has brothers and sister in and out of town. Her only child passed in recent years.   6. Goals of care -we discussed the incurable but treatable nature of her stage IV cancer and general prognosis. She understands the prognosis is poor if she does not tolerate or respond well to chemo -she understands the goal is palliative -full code for now     PLAN: -proceed with Vectibix today -continue Xeloda at 1510m q12h, will change to 7 days on and 7 days off due to skin toxicities I refilled today -lab, flush, f/u, and Vectibix in 3 and 6 weeks   No problem-specific Assessment & Plan notes found for this encounter.   SUMMARY OF ONCOLOGIC HISTORY: Oncology History Overview Note  Cancer Staging No matching staging information was found for the patient.    metastatic colon cancer to liver  06/20/2019 Procedure   Colonoscopy by Dr MRush Landmark4/13/21  IMPRESSION -Seven 3 to 10 mm polyps in the sigmoid colon, in the transverse colon and in the escending colon, removed with a cold snare. Resected and retrireved.  -One 251mpolyp in the descending colon. Biopsies. Tattoes.  -Mediaum sized lipoma in the ascending colon.   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 See Comment B. Colon, Ascending, polyp, Polypectomy:  -TUBULAR ADENOMA -No high grade dysplasia or malignancy.  C. Colon, TRansverse, Polyo, polectomy:  -TUBULAR ADENOMA -No high grade dysplasia or malignancy.  D. Colon, Sigmoid, Polyp, Polypectomy:  -HYPERPLASTIC POLYP   11/07/2019 Imaging   US[place]oreabdomen 11/07/19  IMPRESSION: 1. Two solid masses in the liver are nonspecific. Recommend MRI abdomen with and without contrast for further evaluation.   12/15/2019 Imaging   MRI Abdomen 12/15/19   IMPRESSION: 1. There are two large masses in the liver with appearance favoring metastatic disease or hepatocellular carcinoma or cholangiocarcinoma. A benign etiology is highly unlikely given the enhancement pattern and associated adenopathy. 2. Considerable porta hepatis and retroperitoneal adenopathy. Some of the confluent porta hepatis tumor is potentially infiltrative and abuts the pancreatic body along its right upper margin, making it difficult to completely exclude the possibility of pancreatic adenocarcinoma primary. Possibilities helpful in further workup might include tissue diagnosis, endoscopic ultrasound, or nuclear medicine PET-CT. 3. Pancreas divisum. 4. Lumbar spondylosis and degenerative disc disease. 5. Despite efforts by the technologist and patient, motion artifact is present on today's exam and could not be eliminated. This reduces exam sensitivity and specificity.   12/22/2019 Procedure   Upper Endoscopy by Dr HuBenson Norway0/15/21  IMPRESSION - One lymph node was visualized and measured in the porta hepatis region. Fine needle aspiration performed    12/22/2019 Initial Biopsy   A. LIVER, PORTA HEPATIS MASS, FINE NEEDLE 12/22/19 ASPIRATION:  FINAL MICROSCOPIC DIAGNOSIS:  - Malignant cells consistent with metastatic adenocarcinoma   01/01/2020 Initial Diagnosis   [facility]angi[facility] ([facility]  01/08/2020 Initial Biopsy   FINAL MICROSCOPIC DIAGNOSIS:   A. LIVER, LEFT LOBE, BIOPSY:  - Metastatic adenocarcinoma, consistent with a colorectal primary.  See  comment      COMMENT:   Immunohistochemical stains show the tumor cells are positive for CK20  and CDX2 but negative for CK7, consistent with above interpretation.  Dr. Burr Medico was notified on 01/10/2020   01/08/2020 Genetic Testing   [name]  [name] wildtype and [name]/[name] mutations which make her eligible for target biological agent Vectibix.   01/24/2020 Procedure   PAC placed 01/24/20    01/25/2020 -  Chemotherapy   first line FOLFOX starting 01/25/2020, Vextibix  added with C2 (02/06/20)   06/20/2020 Imaging   CT CAP  IMPRESSION: 1. Continued interval reduction in size and conspicuity of a subsolid nodule of the peripheral left upper lobe. 2. Unchanged prominent pretracheal and subcarinal lymph nodes. 3. Redemonstrated partially calcified low-attenuation liver masses, slightly decreased in size compared to prior examination. 4. Slight interval decrease in size of a portacaval lymph node or conglomerate and retroperitoneal lymph nodes. 5. Findings are consistent with continued treatment response of nodal, pulmonary, and hepatic metastatic disease. 6. Coronary artery disease.   Aortic Atherosclerosis (ICD10-I70.0).     10/09/2020 Imaging   IMPRESSION: 1. Slight decrease in size of dominant liver mass with smaller liver mass within 1-2 mm of prior measurement. 2. Stable appearance of celiac lymph and retroperitoneal lymph nodes. Dominant node with calcification in the gastrohepatic ligament as described. 3. Continued decrease in size of LEFT upper lobe nodule. 4. Three-vessel coronary artery calcification. 5. Aortic atherosclerosis.      INTERVAL HISTORY:  Lindsay [name] is here for a follow up of metastatic colon cancer. She was last seen by me on 02/07/21. She presents to the clinic alone. She reports the bottom of her feet are peeling and sore, and she has some skin cracking to her hands.   All other systems were reviewed with the patient and are negative.  MEDICAL HISTORY:  Past Medical History:  Diagnosis Date   Arthritis    Diabetes mellitus without complication ([place])    Hypertension    met colon ca to liver 01/2020    SURGICAL HISTORY: Past Surgical History:  Procedure Laterality Date   ABDOMINAL HYSTERECTOMY     ANTERIOR AND POSTERIOR REPAIR WITH SACROSPINOUS FIXATION N/A 07/16/2016   Procedure: ANTERIOR AND POSTERIOR REPAIR WITH SACROSPINOUS  FIXATION;  Surgeon: Everlene Farrier, MD;  Location: [facility];  Service: Gynecology;  Laterality: N/A;   CYSTOSCOPY  07/16/2016   Procedure: CYSTOSCOPY;  Surgeon: Everlene Farrier, MD;  Location: [facility];  Service: Gynecology;;   ESOPHAGOGASTRODUODENOSCOPY (EGD) WITH PROPOFOL N/A 12/22/2019   Procedure: ESOPHAGOGASTRODUODENOSCOPY (EGD) WITH PROPOFOL;  Surgeon: [name], MD;  Location: [facility];  Service: Endoscopy;  Laterality: N/A;   FINE NEEDLE ASPIRATION N/A 12/22/2019   Procedure: FINE NEEDLE ASPIRATION (FNA) LINEAR;  Surgeon: [name], MD;  Location: [facility];  Service: Endoscopy;  Laterality: N/A;   IR IMAGING GUIDED PORT INSERTION  01/24/2020   LAPAROSCOPIC VAGINAL HYSTERECTOMY WITH SALPINGECTOMY Bilateral 07/16/2016   Procedure: LAPAROSCOPIC ASSISTED VAGINAL HYSTERECTOMY WITH SALPINGECTOMY;  Surgeon: Everlene Farrier, MD;  Location: [facility];  Service: Gynecology;  Laterality: Bilateral;   MYOMECTOMY ABDOMINAL APPROACH     THYROID SURGERY     tyroid     UPPER ESOPHAGEAL ENDOSCOPIC ULTRASOUND (EUS) N/A 12/22/2019   Procedure: UPPER ESOPHAGEAL ENDOSCOPIC ULTRASOUND (EUS);  Surgeon: [name], MD;  Location: [facility];  Service: Endoscopy;  Laterality: N/A;    I have reviewed the social history and family history with the patient and they are unchanged from previous note.  ALLERGIES:  is allergic to oxaliplatin.  MEDICATIONS:  Current Outpatient Medications  Medication Sig Dispense Refill   aspirin EC 81 MG tablet Take 1 tablet (81 mg total) by mouth daily. 90 tablet 3   atorvastatin (LIPITOR) 20 MG tablet Take 20 mg by mouth at bedtime.   1   capecitabine (XELODA) 500 MG tablet Take 4 tablets (2,000 mg total) by mouth 2 (two) times daily after a meal. Take for 14 days, followed by 7 days off. Repeat every 21 days. 112 tablet 1   clindamycin (CLINDAGEL) 1 % gel Apply topically 2 (two) times daily. 60 g 2   clobetasol cream (TEMOVATE) 9.51 % Apply 1 application topically  daily.     doxycycline (VIBRA-TABS) 100 MG tablet Take 1 tablet (100 mg total) by mouth 2 (two) times daily. 60 tablet 1   hydrochlorothiazide (MICROZIDE) 12.5 MG capsule Take 12.5 mg by mouth daily.     lidocaine-prilocaine (EMLA) cream Apply 1 application topically as needed. 30 g 1   magic mouthwash w/lidocaine SOLN Take 5 mLs by mouth 4 (four) times daily. 475 mL 2   magnesium oxide (MAG-OX) 400 (241.3 Mg) MG tablet Take 1 tablet (400 mg total) by mouth in the morning, at noon, and at bedtime. 90 tablet 2   magnesium oxide (MAG-OX) 400 MG tablet Take 2 tablets (800 mg total) by mouth 3 (three) times daily. 180 tablet 2   ondansetron (ZOFRAN) 8 MG tablet TAKE 1 TABLET BY MOUTH EVERY 8 HOURS AS NEEDED FOR NAUSEA OR VOMITING 20 tablet 0   potassium chloride SA (KLOR-CON M) 20 MEQ tablet Take 1 tablet (20 mEq total) by mouth 2 (two) times daily. 180 each 1   prochlorperazine (COMPAZINE) 10 MG tablet Take 1 tablet (10 mg total) by mouth every 6 (six) hours as needed for nausea or vomiting. 30 tablet 2   spironolactone (ALDACTONE) 25 MG tablet Take 25 mg by mouth daily with breakfast.     urea (CARMOL) 10 % cream Apply topically 2 (two) times daily. 71 g 2   valACYclovir (VALTREX) 1000 MG tablet Take 1 tablet (1,000 mg total) by mouth 2 (two) times daily. 10 tablet 0   verapamil (CALAN) 120 MG tablet Take 120 mg by mouth 3 (three) times daily.     No current facility-administered medications for this visit.    PHYSICAL EXAMINATION: ECOG PERFORMANCE STATUS: 1 - Symptomatic but completely ambulatory  Vitals:   03/05/21 1352  BP: 139/82  Pulse: 78  Resp: 18  Temp: 98.2 F (36.8 C)  SpO2: 100%   Wt Readings from Last 3 Encounters:  03/05/21 179 lb 3.2 oz (81.3 kg)  02/07/21 179 lb 6.4 oz (81.4 kg)  01/09/21 180 lb 7 oz (81.8 kg)     GENERAL:alert, no distress and comfortable SKIN: skin color normal, no rashes or significant lesions EYES: normal, Conjunctiva are pink and non-injected,  sclera clear  NEURO: alert & oriented x 3 with fluent speech  LABORATORY DATA:  I have reviewed the data as listed CBC Latest Ref Rng & Units 03/05/2021 02/07/2021 01/09/2021  WBC 4.0 - 10.5 K/uL 5.2 5.7 5.9  Hemoglobin 12.0 - 15.0 g/dL 11.3(L) 11.8(L) 11.1(L)  Hematocrit 36.0 - 46.0 % 33.2(L) 33.4(L) 32.2(L)  Platelets 150 - 400 K/uL 213 268 218     CMP Latest Ref Rng & Units 02/07/2021 01/09/2021 12/20/2020  Glucose 70 - 99 mg/dL 96 100(H) 109(H)  BUN 8 - 23 mg/dL 30(H) 24(H) 19  Creatinine 0.44 -  1.00 mg/dL 0.97 0.75 0.81  Sodium 135 - 145 mmol/L 136 135 133(L)  Potassium 3.5 - 5.1 mmol/L 5.1 2.9(L) 3.6  Chloride 98 - 111 mmol/L 105 94(L) 97(L)  CO2 22 - 32 mmol/L 21(L) 28 26  Calcium 8.9 - 10.3 mg/dL 9.6 10.4(H) 9.3  Total Protein 6.5 - 8.1 g/dL 8.3(H) 7.6 8.2(H)  Total Bilirubin 0.3 - 1.2 mg/dL 0.5 0.6 0.6  Alkaline Phos 38 - 126 U/L 74 72 81  AST 15 - 41 U/L 26 42(H) 42(H)  ALT 0 - 44 U/L 26 29 35      RADIOGRAPHIC STUDIES: I have personally reviewed the radiological images as listed and agreed with the findings in the report. No results found.    Orders Placed This Encounter  Procedures   CEA (IN HOUSE-[facility])   All questions were answered. The patient knows to call the clinic with any problems, questions or concerns. No barriers to learning was detected. The total time spent in the appointment was 30 minutes.     [name], MD 03/05/2021   I, Wilburn Mylar, am acting as scribe for [name], MD.   I have reviewed the above documentation for accuracy and completeness, and I agree with the above.

## 2021-03-08 ENCOUNTER — Encounter: Payer: Self-pay | Admitting: Hematology

## 2021-03-14 ENCOUNTER — Other Ambulatory Visit (HOSPITAL_COMMUNITY): Payer: Self-pay

## 2021-03-18 ENCOUNTER — Other Ambulatory Visit (HOSPITAL_COMMUNITY): Payer: Self-pay

## 2021-03-19 ENCOUNTER — Other Ambulatory Visit (HOSPITAL_COMMUNITY): Payer: Self-pay

## 2021-03-25 ENCOUNTER — Other Ambulatory Visit (HOSPITAL_COMMUNITY): Payer: Self-pay

## 2021-03-26 ENCOUNTER — Other Ambulatory Visit (HOSPITAL_COMMUNITY): Payer: Self-pay

## 2021-03-27 ENCOUNTER — Other Ambulatory Visit: Payer: Self-pay

## 2021-03-27 ENCOUNTER — Other Ambulatory Visit (HOSPITAL_COMMUNITY): Payer: Self-pay

## 2021-03-27 ENCOUNTER — Inpatient Hospital Stay: Payer: Medicare (Managed Care) | Attending: Hematology

## 2021-03-27 ENCOUNTER — Inpatient Hospital Stay (HOSPITAL_BASED_OUTPATIENT_CLINIC_OR_DEPARTMENT_OTHER): Payer: Medicare (Managed Care) | Admitting: Hematology

## 2021-03-27 ENCOUNTER — Inpatient Hospital Stay: Payer: Medicare (Managed Care)

## 2021-03-27 ENCOUNTER — Encounter: Payer: Self-pay | Admitting: Hematology

## 2021-03-27 ENCOUNTER — Other Ambulatory Visit: Payer: Self-pay | Admitting: Pharmacist

## 2021-03-27 VITALS — BP 111/78 | HR 76 | Temp 98.4°F | Resp 17 | Wt 180.1 lb

## 2021-03-27 DIAGNOSIS — C189 Malignant neoplasm of colon, unspecified: Secondary | ICD-10-CM

## 2021-03-27 DIAGNOSIS — Z5112 Encounter for antineoplastic immunotherapy: Secondary | ICD-10-CM | POA: Insufficient documentation

## 2021-03-27 DIAGNOSIS — R011 Cardiac murmur, unspecified: Secondary | ICD-10-CM | POA: Insufficient documentation

## 2021-03-27 DIAGNOSIS — Z7984 Long term (current) use of oral hypoglycemic drugs: Secondary | ICD-10-CM | POA: Insufficient documentation

## 2021-03-27 DIAGNOSIS — C221 Intrahepatic bile duct carcinoma: Secondary | ICD-10-CM

## 2021-03-27 DIAGNOSIS — I10 Essential (primary) hypertension: Secondary | ICD-10-CM | POA: Insufficient documentation

## 2021-03-27 DIAGNOSIS — Z79899 Other long term (current) drug therapy: Secondary | ICD-10-CM | POA: Diagnosis not present

## 2021-03-27 DIAGNOSIS — M1990 Unspecified osteoarthritis, unspecified site: Secondary | ICD-10-CM | POA: Diagnosis not present

## 2021-03-27 DIAGNOSIS — E1140 Type 2 diabetes mellitus with diabetic neuropathy, unspecified: Secondary | ICD-10-CM | POA: Insufficient documentation

## 2021-03-27 DIAGNOSIS — C19 Malignant neoplasm of rectosigmoid junction: Secondary | ICD-10-CM | POA: Insufficient documentation

## 2021-03-27 DIAGNOSIS — L03019 Cellulitis of unspecified finger: Secondary | ICD-10-CM | POA: Diagnosis not present

## 2021-03-27 DIAGNOSIS — C778 Secondary and unspecified malignant neoplasm of lymph nodes of multiple regions: Secondary | ICD-10-CM | POA: Diagnosis not present

## 2021-03-27 DIAGNOSIS — C7802 Secondary malignant neoplasm of left lung: Secondary | ICD-10-CM | POA: Insufficient documentation

## 2021-03-27 DIAGNOSIS — Z86010 Personal history of colonic polyps: Secondary | ICD-10-CM | POA: Diagnosis not present

## 2021-03-27 DIAGNOSIS — Z95828 Presence of other vascular implants and grafts: Secondary | ICD-10-CM

## 2021-03-27 DIAGNOSIS — T451X5D Adverse effect of antineoplastic and immunosuppressive drugs, subsequent encounter: Secondary | ICD-10-CM | POA: Insufficient documentation

## 2021-03-27 DIAGNOSIS — Z7189 Other specified counseling: Secondary | ICD-10-CM

## 2021-03-27 DIAGNOSIS — C787 Secondary malignant neoplasm of liver and intrahepatic bile duct: Secondary | ICD-10-CM | POA: Diagnosis present

## 2021-03-27 DIAGNOSIS — L271 Localized skin eruption due to drugs and medicaments taken internally: Secondary | ICD-10-CM | POA: Diagnosis not present

## 2021-03-27 DIAGNOSIS — Z87891 Personal history of nicotine dependence: Secondary | ICD-10-CM | POA: Diagnosis not present

## 2021-03-27 LAB — CBC WITH DIFFERENTIAL (CANCER CENTER ONLY)
Abs Immature Granulocytes: 0.02 10*3/uL (ref 0.00–0.07)
Basophils Absolute: 0 10*3/uL (ref 0.0–0.1)
Basophils Relative: 1 %
Eosinophils Absolute: 0.1 10*3/uL (ref 0.0–0.5)
Eosinophils Relative: 1 %
HCT: 35.3 % — ABNORMAL LOW (ref 36.0–46.0)
Hemoglobin: 12.2 g/dL (ref 12.0–15.0)
Immature Granulocytes: 0 %
Lymphocytes Relative: 22 %
Lymphs Abs: 1.4 10*3/uL (ref 0.7–4.0)
MCH: 34.9 pg — ABNORMAL HIGH (ref 26.0–34.0)
MCHC: 34.6 g/dL (ref 30.0–36.0)
MCV: 100.9 fL — ABNORMAL HIGH (ref 80.0–100.0)
Monocytes Absolute: 0.6 10*3/uL (ref 0.1–1.0)
Monocytes Relative: 10 %
Neutro Abs: 4 10*3/uL (ref 1.7–7.7)
Neutrophils Relative %: 66 %
Platelet Count: 239 10*3/uL (ref 150–400)
RBC: 3.5 MIL/uL — ABNORMAL LOW (ref 3.87–5.11)
RDW: 15 % (ref 11.5–15.5)
WBC Count: 6.1 10*3/uL (ref 4.0–10.5)
nRBC: 0 % (ref 0.0–0.2)

## 2021-03-27 LAB — CMP (CANCER CENTER ONLY)
ALT: 26 U/L (ref 0–44)
AST: 27 U/L (ref 15–41)
Albumin: 4.5 g/dL (ref 3.5–5.0)
Alkaline Phosphatase: 62 U/L (ref 38–126)
Anion gap: 9 (ref 5–15)
BUN: 24 mg/dL — ABNORMAL HIGH (ref 8–23)
CO2: 26 mmol/L (ref 22–32)
Calcium: 10.7 mg/dL — ABNORMAL HIGH (ref 8.9–10.3)
Chloride: 102 mmol/L (ref 98–111)
Creatinine: 0.7 mg/dL (ref 0.44–1.00)
GFR, Estimated: >60 mL/min (ref >60)
Glucose, Bld: 107 mg/dL — ABNORMAL HIGH (ref 70–99)
Potassium: 4.5 mmol/L (ref 3.5–5.1)
Sodium: 137 mmol/L (ref 135–145)
Total Bilirubin: 0.4 mg/dL (ref 0.3–1.2)
Total Protein: 8.3 g/dL — ABNORMAL HIGH (ref 6.5–8.1)

## 2021-03-27 LAB — MAGNESIUM: Magnesium: 1.5 mg/dL — ABNORMAL LOW (ref 1.7–2.4)

## 2021-03-27 LAB — CEA (IN HOUSE-CHCC): CEA (CHCC-In House): <1 ng/mL (ref 0.00–5.00)

## 2021-03-27 MED ORDER — HEPARIN SOD (PORK) LOCK FLUSH 100 UNIT/ML IV SOLN
500.0000 [IU] | Freq: Once | INTRAVENOUS | Status: AC | PRN
  Administered 2021-03-27: 500 [IU]

## 2021-03-27 MED ORDER — CAPECITABINE 500 MG PO TABS: 1500.0000 mg | ORAL_TABLET | Freq: Two times a day (BID) | ORAL | 1 refills | Status: DC

## 2021-03-27 MED ORDER — SODIUM CHLORIDE 0.9 % IV SOLN
6.0000 mg/kg | Freq: Once | INTRAVENOUS | Status: AC
  Administered 2021-03-27: 500 mg via INTRAVENOUS
  Filled 2021-03-27: qty 5

## 2021-03-27 MED ORDER — SODIUM CHLORIDE 0.9% FLUSH
10.0000 mL | Freq: Once | INTRAVENOUS | Status: AC
  Administered 2021-03-27: 10 mL

## 2021-03-27 MED ORDER — MAGNESIUM SULFATE 4 GM/100ML IV SOLN
4.0000 g | Freq: Once | INTRAVENOUS | Status: AC
  Administered 2021-03-27: 4 g via INTRAVENOUS
  Filled 2021-03-27: qty 100

## 2021-03-27 MED ORDER — SODIUM CHLORIDE 0.9 % IV SOLN: Freq: Once | INTRAVENOUS | Status: AC

## 2021-03-27 MED ORDER — SODIUM CHLORIDE 0.9% FLUSH
10.0000 mL | INTRAVENOUS | Status: DC | PRN
  Administered 2021-03-27: 10 mL

## 2021-03-27 NOTE — Patient Instructions (Signed)
[name] ONCOLOGY  Discharge Instructions: Thank you for choosing [facility] to provide your oncology and hematology care.   If you have a lab appointment with the [facility], please go directly to the [facility] and check in at the registration area.   Wear comfortable clothing and clothing appropriate for easy access to any Portacath or PICC line.   We strive to give you quality time with your provider. You may need to reschedule your appointment if you arrive late (15 or more minutes).  Arriving late affects you and other patients whose appointments are after yours.  Also, if you miss three or more appointments without notifying the office, you may be dismissed from the clinic at the providers discretion.      For prescription refill requests, have your pharmacy contact our office and allow 72 hours for refills to be completed.    Today you received the following chemotherapy and/or immunotherapy agents: Panitumumab.      To help prevent nausea and vomiting after your treatment, we encourage you to take your nausea medication as directed.  BELOW ARE SYMPTOMS THAT SHOULD BE REPORTED IMMEDIATELY: *FEVER GREATER THAN 100.4 F (38 C) OR HIGHER *CHILLS OR SWEATING *NAUSEA AND VOMITING THAT IS NOT CONTROLLED WITH YOUR NAUSEA MEDICATION *UNUSUAL SHORTNESS OF BREATH *UNUSUAL BRUISING OR BLEEDING *URINARY PROBLEMS (pain or burning when urinating, or frequent urination) *BOWEL PROBLEMS (unusual diarrhea, constipation, pain near the anus) TENDERNESS IN MOUTH AND THROAT WITH OR WITHOUT PRESENCE OF ULCERS (sore throat, sores in mouth, or a toothache) UNUSUAL RASH, SWELLING OR PAIN  UNUSUAL VAGINAL DISCHARGE OR ITCHING   Items with * indicate a potential emergency and should be followed up as soon as possible or go to the Emergency Department if any problems should occur.  Please show the CHEMOTHERAPY ALERT CARD or IMMUNOTHERAPY ALERT CARD at check-in  to the Emergency Department and triage nurse.  Should you have questions after your visit or need to cancel or reschedule your appointment, please contact [facility]  Dept: [phone]  and follow the prompts.  Office hours are 8:00 a.m. to 4:30 p.m. Monday - Friday. Please note that voicemails left after 4:00 p.m. may not be returned until the following business day.  We are closed weekends and major holidays. You have access to a nurse at all times for urgent questions. Please call the main number to the clinic Dept: [phone] and follow the prompts.   For any non-urgent questions, you may also contact your provider using MyChart. We now offer e-Visits for anyone 77 and older to request care online for non-urgent symptoms. For details visit [email or web address].   Also download the MyChart app! Go to the app store, search "MyChart", open the app, select [facility], and log in with your MyChart username and password.  Due to Covid, a mask is required upon entering the hospital/clinic. If you do not have a mask, one will be given to you upon arrival. For doctor visits, patients may have 1 support person aged 22 or older with them. For treatment visits, patients cannot have anyone with them due to current Covid guidelines and our immunocompromised population.

## 2021-03-27 NOTE — Progress Notes (Signed)
Per Dr. Burr Medico, okay to begin magnesium 4gm prior to [facility] lab results.

## 2021-03-27 NOTE — Progress Notes (Signed)
Oral Oncology Pharmacist Encounter  Patient's insurance no longer allows Xeloda to be filled through [facility]. Insurance contracted to fill through Therapist, nutritional. Prescription redirected to [facility] for dispensing.  Leron Croak, PharmD, BCPS Hematology/Oncology Clinical Pharmacist [facility] [phone] 03/27/2021 9:20 AM

## 2021-03-27 NOTE — Progress Notes (Signed)
Per Dr. Burr Medico, ok to treat with Magnesium 1.5

## 2021-03-27 NOTE — Progress Notes (Signed)
[facility]   [phone] [phone]   Clinic Follow up Note   Patient Care Team: [name], MD as PCP - General (Internal Medicine) [name], RN (Inactive) as Oncology Nurse Navigator [name], MD as Consulting Physician (Oncology) [name], MD as Consulting Physician (Gastroenterology)  Date of Service:  03/27/2021  CHIEF COMPLAINT: f/u of metastatic colon cancer  CURRENT THERAPY:  -Xeloda started 01/25/20, currently on 1500 mg BID days 1-7 q14d -Vectibix, q3weeks, started 02/06/20  ASSESSMENT & PLAN:  Lindsay [name] is a 66 y.o. female with   1. 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 MSS, [name]/[name] wildtype -Her 12/15/19 MRI abdomen showed 2 large liver masses indicating metastatic disease and bulky porta hepatis and retroperitoneal adenopathy. -Her EUS from 12/22/19 showed 3.5cm LN in porta hepatis region and biopsy confirmed metastatic adenocarcinoma.  Preliminary cytology sample is not adequate for any additional IHC study for the origin of tumor, the morphology does not support HCC. -Her EGD was negative for malignancy.  Her colonoscopy in March 2021 did show a large 2 cm polyps in descending colon, biopsy showed high-grade dysplasia.   -Liver biopsy confirmed adenocarcinoma, immunostain showed positive CDX2 and CK20, negative CK7, supporting primary colorectal cancer -PET scan showed known liver metastasis, and diffuse thoracic and abdominal adenopathy. There is a hypermetabolic 1.8 cm pulmonary nodule in left upper lobe, and focal hypermetabolic lesion in the splenic fixture of the colon, concerning for primary tumor. -She underwent repeat colonoscopy and biopsy by Dr. Benson Norway; additional IHC testing with TTF-1 shows focal staining, which can be seen in 5-10% of colorectal primary cancers as well as lung cancer. However given the above, the overall picture is still consistent with colorectal primary.  -She began first  line systemic FOLFOX on 11/18, goal is palliative. Tolerating well  -FO shows [name]/[name] wild-type, she is a candidate for EGFR inhibitor Panitumumab which was added with C2 -Restaging CT's 04/01/2020 and 06/20/20 showed interval reduction in size of liver, lung, and nodal metastases.  CEA normalized as of 04/16/2020 -Oxaliplatin was stopped after cycle 18 due to reaction.  She also had vasovagal-like reaction after Vectibix, before 5-FU on 08/02/20 but no issues on 10/02/20.  Due to skin cracking and concern for 5-FU infusion reactions she was switched to Xeloda.   -She has developed significant hand-foot syndrome and was reduced to 1500 mg BID 2 weeks on/1 week off on 01/20/21. She continues to have skin toxicity, so we will switch her to 7 days on/7 days off.  -she is tolerating 1500 mg BID 7 days on/7 days off well overall. She still has skin darkening, as to be expected. -lab reviewed, adequate for Vectibix today    2. Skin and nail changes, paronychia, hand-foot syndrome, hypomagnesia -secondary to 5-FU and worse on Xeloda -tolerating better on 1500 mg 7 days on/7 days off -she is currently taking magnesium 800 mg TID. Mag is 1.5 today; I advised her to increase for a week.   3. Grade 1 neuropathy  -S/p cycle 5, oxaliplatin dose reduced and eventually discontinued after reaction 09/18/20 -Persistent neuropathy at the feet and fingertips, mild   4. Chronic Lower abdominal pain, Nausea -She has had chronic low abdominal pain for 5 years ranging up to 4 times a week. She notes her pain worsened in the last 3 years, but no work-up until 2021.   -Her pain is 5/10 at most but when high not managed on Motrin.  -she has tramadol  but does not take it much -encouraged her to use Ensure/boost if she has decreased appetite  -f/up with dietician -abdominal pain improved on chemo.  mild an intermittent. Does not require medication    4. HTN, DM, Heart Murmur, Arthritis -Per PCP and Cardiologist Dr  Einar Gip -DM controlled, no pre-existing neuropathy   5. 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 -She lives with husband and has brothers and sister in and out of town. Her only child passed in recent years.   6. Goals of care -we discussed the incurable but treatable nature of her stage IV cancer and general prognosis. She understands the prognosis is poor if she does not tolerate or respond well to chemo -she understands the goal is palliative -full code for now      PLAN: -proceed with Vectibix today -continue Xeloda at 1541m q12h, 7 days on and 7 days off -lab, flush, f/u, and Vectibix in 3 and 6 weeks   No problem-specific Assessment & Plan notes found for this encounter.   SUMMARY OF ONCOLOGIC HISTORY: Oncology History Overview Note  Cancer Staging No matching staging information was found for the patient.    metastatic colon cancer to liver  06/20/2019 Procedure   Colonoscopy by Dr MRush Landmark4/13/21  IMPRESSION -Seven 3 to 10 mm polyps in the sigmoid colon, in the transverse colon and in the escending colon, removed with a cold snare. Resected and retrireved.  -One 264mpolyp in the descending colon. Biopsies. Tattoes.  -Mediaum sized lipoma in the ascending colon.   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 See Comment B. Colon, Ascending, polyp, Polypectomy:  -TUBULAR ADENOMA -No high grade dysplasia or malignancy.  C. Colon, TRansverse, Polyo, polectomy:  -TUBULAR ADENOMA -No high grade dysplasia or malignancy.  D. Colon, Sigmoid, Polyp, Polypectomy:  -HYPERPLASTIC POLYP   11/07/2019 Imaging   USKoreabdomen 11/07/19  IMPRESSION: 1. Two solid masses in the liver are nonspecific. Recommend MRI abdomen with and without contrast for further evaluation.   12/15/2019 Imaging   MRI Abdomen 12/15/19  IMPRESSION: 1. There are two large masses in the liver  with appearance favoring metastatic disease or hepatocellular carcinoma or cholangiocarcinoma. A benign etiology is highly unlikely given the enhancement pattern and associated adenopathy. 2. Considerable porta hepatis and retroperitoneal adenopathy. Some of the confluent porta hepatis tumor is potentially infiltrative and abuts the pancreatic body along its right upper margin, making it difficult to completely exclude the possibility of pancreatic adenocarcinoma primary. Possibilities helpful in further workup might include tissue diagnosis, endoscopic ultrasound, or nuclear medicine PET-CT. 3. Pancreas divisum. 4. Lumbar spondylosis and degenerative disc disease. 5. Despite efforts by the technologist and patient, motion artifact is present on today's exam and could not be eliminated. This reduces exam sensitivity and specificity.   12/22/2019 Procedure   Upper Endoscopy by Dr HuBenson Norway0/15/21  IMPRESSION - One lymph node was visualized and measured in the porta hepatis region. Fine needle aspiration performed    12/22/2019 Initial Biopsy   A. LIVER, PORTA HEPATIS MASS, FINE NEEDLE 12/22/19 ASPIRATION:  FINAL MICROSCOPIC DIAGNOSIS:  - Malignant cells consistent with metastatic adenocarcinoma   01/01/2020 Initial Diagnosis   Intrahepatic cholangiocarcinoma ([facility]  01/08/2020 Initial Biopsy   FINAL MICROSCOPIC DIAGNOSIS:   A. LIVER, LEFT LOBE, BIOPSY:  - Metastatic adenocarcinoma, consistent with a colorectal primary.  See  comment      COMMENT:   Immunohistochemical stains  show the tumor cells are positive for CK20  and CDX2 but negative for CK7, consistent with above interpretation.  Dr. Burr Medico was notified on 01/10/2020   01/08/2020 Genetic Testing   [name]  [name] wildtype and [name]/[name] mutations which make her eligible for target biological agent Vectibix.   01/24/2020 Procedure   PAC placed 01/24/20   01/25/2020 -  Chemotherapy   first line FOLFOX starting  01/25/2020, Vextibix  added with C2 (02/06/20)   06/20/2020 Imaging   CT CAP  IMPRESSION: 1. Continued interval reduction in size and conspicuity of a subsolid nodule of the peripheral left upper lobe. 2. Unchanged prominent pretracheal and subcarinal lymph nodes. 3. Redemonstrated partially calcified low-attenuation liver masses, slightly decreased in size compared to prior examination. 4. Slight interval decrease in size of a portacaval lymph node or conglomerate and retroperitoneal lymph nodes. 5. Findings are consistent with continued treatment response of nodal, pulmonary, and hepatic metastatic disease. 6. Coronary artery disease.   Aortic Atherosclerosis (ICD10-I70.0).     10/09/2020 Imaging   IMPRESSION: 1. Slight decrease in size of dominant liver mass with smaller liver mass within 1-2 mm of prior measurement. 2. Stable appearance of celiac lymph and retroperitoneal lymph nodes. Dominant node with calcification in the gastrohepatic ligament as described. 3. Continued decrease in size of LEFT upper lobe nodule. 4. Three-vessel coronary artery calcification. 5. Aortic atherosclerosis.      INTERVAL HISTORY:  LYLIA KARN is here for a follow up of metastatic colon cancer. She was last seen by me on 03/05/21. She was seen in the infusion area.   All other systems were reviewed with the patient and are negative.  MEDICAL HISTORY:  Past Medical History:  Diagnosis Date   Arthritis    Diabetes mellitus without complication ([place])    Hypertension    met colon ca to liver 01/2020    SURGICAL HISTORY: Past Surgical History:  Procedure Laterality Date   ABDOMINAL HYSTERECTOMY     ANTERIOR AND POSTERIOR REPAIR WITH SACROSPINOUS FIXATION N/A 07/16/2016   Procedure: ANTERIOR AND POSTERIOR REPAIR WITH SACROSPINOUS FIXATION;  Surgeon: Everlene Farrier, MD;  Location: [facility];  Service: Gynecology;  Laterality: N/A;   CYSTOSCOPY  07/16/2016   Procedure: CYSTOSCOPY;  Surgeon:  Everlene Farrier, MD;  Location: [facility];  Service: Gynecology;;   ESOPHAGOGASTRODUODENOSCOPY (EGD) WITH PROPOFOL N/A 12/22/2019   Procedure: ESOPHAGOGASTRODUODENOSCOPY (EGD) WITH PROPOFOL;  Surgeon: [name], MD;  Location: [facility];  Service: Endoscopy;  Laterality: N/A;   FINE NEEDLE ASPIRATION N/A 12/22/2019   Procedure: FINE NEEDLE ASPIRATION (FNA) LINEAR;  Surgeon: [name], MD;  Location: [facility];  Service: Endoscopy;  Laterality: N/A;   IR IMAGING GUIDED PORT INSERTION  01/24/2020   LAPAROSCOPIC VAGINAL HYSTERECTOMY WITH SALPINGECTOMY Bilateral 07/16/2016   Procedure: LAPAROSCOPIC ASSISTED VAGINAL HYSTERECTOMY WITH SALPINGECTOMY;  Surgeon: Everlene Farrier, MD;  Location: [facility];  Service: Gynecology;  Laterality: Bilateral;   MYOMECTOMY ABDOMINAL APPROACH     THYROID SURGERY     tyroid     UPPER ESOPHAGEAL ENDOSCOPIC ULTRASOUND (EUS) N/A 12/22/2019   Procedure: UPPER ESOPHAGEAL ENDOSCOPIC ULTRASOUND (EUS);  Surgeon: [name], MD;  Location: [facility];  Service: Endoscopy;  Laterality: N/A;    I have reviewed the social history and family history with the patient and they are unchanged from previous note.  ALLERGIES:  is allergic to oxaliplatin.  MEDICATIONS:  Current Outpatient Medications  Medication Sig Dispense Refill   aspirin EC 81 MG tablet Take 1 tablet (81 mg  total) by mouth daily. 90 tablet 3   atorvastatin (LIPITOR) 20 MG tablet Take 20 mg by mouth at bedtime.   1   capecitabine (XELODA) 500 MG tablet Take 3 tablets (1,500 mg total) by mouth 2 (two) times daily after a meal. Take for 7 days, followed by 7 days off. Repeat every 14 days. 84 tablet 1   clindamycin (CLINDAGEL) 1 % gel Apply topically 2 (two) times daily. 60 g 2   clobetasol cream (TEMOVATE) 0.73 % Apply 1 application topically daily.     doxycycline (VIBRA-TABS) 100 MG tablet Take 1 tablet (100 mg total) by mouth 2 (two) times daily. 60 tablet 1   hydrochlorothiazide (MICROZIDE) 12.5  MG capsule Take 12.5 mg by mouth daily.     lidocaine-prilocaine (EMLA) cream Apply 1 application topically as needed. 30 g 1   magic mouthwash w/lidocaine SOLN Take 5 mLs by mouth 4 (four) times daily. 475 mL 2   magnesium oxide (MAG-OX) 400 MG tablet Take 2 tablets (800 mg total) by mouth 3 (three) times daily. 180 tablet 2   ondansetron (ZOFRAN) 8 MG tablet TAKE 1 TABLET BY MOUTH EVERY 8 HOURS AS NEEDED FOR NAUSEA OR VOMITING 20 tablet 0   potassium chloride SA (KLOR-CON M) 20 MEQ tablet Take 1 tablet (20 mEq total) by mouth 2 (two) times daily. 180 each 1   prochlorperazine (COMPAZINE) 10 MG tablet Take 1 tablet (10 mg total) by mouth every 6 (six) hours as needed for nausea or vomiting. 30 tablet 2   spironolactone (ALDACTONE) 25 MG tablet Take 25 mg by mouth daily with breakfast.     urea (CARMOL) 10 % cream Apply topically 2 (two) times daily. 71 g 2   valACYclovir (VALTREX) 1000 MG tablet Take 1 tablet (1,000 mg total) by mouth 2 (two) times daily. 10 tablet 0   verapamil (CALAN) 120 MG tablet Take 120 mg by mouth 3 (three) times daily.     No current facility-administered medications for this visit.   Facility-Administered Medications Ordered in Other Visits  Medication Dose Route Frequency Provider Last Rate Last Admin   heparin lock flush 100 unit/mL  500 Units Intracatheter Once PRN [name], MD       magnesium sulfate IVPB 4 g 100 mL  4 g Intravenous Once [name], MD 50 mL/hr at 03/27/21 1412 4 g at 03/27/21 1412   panitumumab (VECTIBIX) 500 mg in sodium chloride 0.9 % 100 mL chemo infusion  6 mg/kg (Treatment Plan Recorded) Intravenous Once [name], MD       sodium chloride flush (NS) 0.9 % injection 10 mL  10 mL Intracatheter PRN [name], MD        PHYSICAL EXAMINATION: ECOG PERFORMANCE STATUS: 1 - Symptomatic but completely ambulatory  There were no vitals filed for this visit. Wt Readings from Last 3 Encounters:  03/05/21 179 lb 3.2 oz (81.3 kg)  02/07/21 179 lb  6.4 oz (81.4 kg)  01/09/21 180 lb 7 oz (81.8 kg)     GENERAL:alert, no distress and comfortable SKIN: skin color normal, no rashes or significant lesions EYES: normal, Conjunctiva are pink and non-injected, sclera clear  NEURO: alert & oriented x 3 with fluent speech  LABORATORY DATA:  I have reviewed the data as listed CBC Latest Ref Rng & Units 03/27/2021 03/05/2021 02/07/2021  WBC 4.0 - 10.5 K/uL 6.1 5.2 5.7  Hemoglobin 12.0 - 15.0 g/dL 12.2 11.3(L) 11.8(L)  Hematocrit 36.0 - 46.0 % 35.3(L) 33.2(L) 33.4(L)  Platelets 150 - 400 K/uL 239 213 268     CMP Latest Ref Rng & Units 03/27/2021 03/05/2021 02/07/2021  Glucose 70 - 99 mg/dL 107(H) 104(H) 96  BUN 8 - 23 mg/dL 24(H) 22 30(H)  Creatinine 0.44 - 1.00 mg/dL 0.70 0.80 0.97  Sodium 135 - 145 mmol/L 137 136 136  Potassium 3.5 - 5.1 mmol/L 4.5 4.7 5.1  Chloride 98 - 111 mmol/L 102 103 105  CO2 22 - 32 mmol/L 26 26 21(L)  Calcium 8.9 - 10.3 mg/dL 10.7(H) 10.4(H) 9.6  Total Protein 6.5 - 8.1 g/dL 8.3(H) 8.3(H) 8.3(H)  Total Bilirubin 0.3 - 1.2 mg/dL 0.4 0.6 0.5  Alkaline Phos 38 - 126 U/L 62 60 74  AST 15 - 41 U/L _0 ALT 0 - 44 U/L _1 RADIOGRAPHIC STUDIES: I have personally reviewed the radiological images as listed and agreed with the findings in the report. No results found.    Orders Placed This Encounter  Procedures   CEA (IN HOUSE-[facility])   All questions were answered. The patient knows to call the clinic with any problems, questions or concerns. No barriers to learning was detected. The total time spent in the appointment was 30 minutes.     [name], MD 03/27/2021   I, Wilburn Mylar, am acting as scribe for [name], MD.   I have reviewed the above documentation for accuracy and completeness, and I agree with the above.

## 2021-03-28 ENCOUNTER — Other Ambulatory Visit: Payer: Self-pay

## 2021-03-28 MED ORDER — MAGNESIUM OXIDE 400 MG PO TABS: 2.0000 | ORAL_TABLET | Freq: Three times a day (TID) | ORAL | 2 refills | Status: DC

## 2021-03-30 ENCOUNTER — Encounter: Payer: Self-pay | Admitting: Hematology

## 2021-03-31 ENCOUNTER — Telehealth: Payer: Self-pay | Admitting: Hematology

## 2021-03-31 NOTE — Telephone Encounter (Signed)
Sch per 1/21 inbasket, pt aware

## 2021-04-14 ENCOUNTER — Other Ambulatory Visit: Payer: Self-pay

## 2021-04-17 ENCOUNTER — Inpatient Hospital Stay: Payer: Medicare (Managed Care)

## 2021-04-17 ENCOUNTER — Other Ambulatory Visit: Payer: Self-pay

## 2021-04-17 ENCOUNTER — Telehealth: Payer: Self-pay

## 2021-04-17 ENCOUNTER — Inpatient Hospital Stay (HOSPITAL_BASED_OUTPATIENT_CLINIC_OR_DEPARTMENT_OTHER): Payer: Medicare (Managed Care) | Admitting: Hematology

## 2021-04-17 ENCOUNTER — Encounter: Payer: Self-pay | Admitting: Hematology

## 2021-04-17 ENCOUNTER — Inpatient Hospital Stay: Payer: Medicare (Managed Care) | Admitting: Hematology

## 2021-04-17 ENCOUNTER — Inpatient Hospital Stay: Payer: Medicare (Managed Care) | Attending: Hematology

## 2021-04-17 ENCOUNTER — Other Ambulatory Visit (HOSPITAL_COMMUNITY): Payer: Self-pay

## 2021-04-17 VITALS — BP 131/82 | HR 79 | Temp 98.4°F | Resp 18 | Ht 66.0 in | Wt 184.8 lb

## 2021-04-17 DIAGNOSIS — T451X5D Adverse effect of antineoplastic and immunosuppressive drugs, subsequent encounter: Secondary | ICD-10-CM | POA: Insufficient documentation

## 2021-04-17 DIAGNOSIS — Z5112 Encounter for antineoplastic immunotherapy: Secondary | ICD-10-CM | POA: Insufficient documentation

## 2021-04-17 DIAGNOSIS — I10 Essential (primary) hypertension: Secondary | ICD-10-CM | POA: Insufficient documentation

## 2021-04-17 DIAGNOSIS — Z79899 Other long term (current) drug therapy: Secondary | ICD-10-CM | POA: Insufficient documentation

## 2021-04-17 DIAGNOSIS — E1140 Type 2 diabetes mellitus with diabetic neuropathy, unspecified: Secondary | ICD-10-CM | POA: Insufficient documentation

## 2021-04-17 DIAGNOSIS — C19 Malignant neoplasm of rectosigmoid junction: Secondary | ICD-10-CM | POA: Diagnosis present

## 2021-04-17 DIAGNOSIS — Z86010 Personal history of colonic polyps: Secondary | ICD-10-CM | POA: Diagnosis not present

## 2021-04-17 DIAGNOSIS — L03019 Cellulitis of unspecified finger: Secondary | ICD-10-CM | POA: Diagnosis not present

## 2021-04-17 DIAGNOSIS — C189 Malignant neoplasm of colon, unspecified: Secondary | ICD-10-CM

## 2021-04-17 DIAGNOSIS — Z7189 Other specified counseling: Secondary | ICD-10-CM

## 2021-04-17 DIAGNOSIS — Z87891 Personal history of nicotine dependence: Secondary | ICD-10-CM | POA: Diagnosis not present

## 2021-04-17 DIAGNOSIS — G620 Drug-induced polyneuropathy: Secondary | ICD-10-CM | POA: Insufficient documentation

## 2021-04-17 DIAGNOSIS — C221 Intrahepatic bile duct carcinoma: Secondary | ICD-10-CM | POA: Diagnosis not present

## 2021-04-17 DIAGNOSIS — M1990 Unspecified osteoarthritis, unspecified site: Secondary | ICD-10-CM | POA: Diagnosis not present

## 2021-04-17 DIAGNOSIS — C7802 Secondary malignant neoplasm of left lung: Secondary | ICD-10-CM | POA: Insufficient documentation

## 2021-04-17 DIAGNOSIS — L271 Localized skin eruption due to drugs and medicaments taken internally: Secondary | ICD-10-CM | POA: Insufficient documentation

## 2021-04-17 DIAGNOSIS — K219 Gastro-esophageal reflux disease without esophagitis: Secondary | ICD-10-CM | POA: Diagnosis not present

## 2021-04-17 DIAGNOSIS — C787 Secondary malignant neoplasm of liver and intrahepatic bile duct: Secondary | ICD-10-CM | POA: Insufficient documentation

## 2021-04-17 DIAGNOSIS — R011 Cardiac murmur, unspecified: Secondary | ICD-10-CM | POA: Diagnosis not present

## 2021-04-17 DIAGNOSIS — C778 Secondary and unspecified malignant neoplasm of lymph nodes of multiple regions: Secondary | ICD-10-CM | POA: Diagnosis not present

## 2021-04-17 DIAGNOSIS — Z7984 Long term (current) use of oral hypoglycemic drugs: Secondary | ICD-10-CM | POA: Insufficient documentation

## 2021-04-17 DIAGNOSIS — Z95828 Presence of other vascular implants and grafts: Secondary | ICD-10-CM

## 2021-04-17 LAB — CMP (CANCER CENTER ONLY)
ALT: 27 U/L (ref 0–44)
AST: 25 U/L (ref 15–41)
Albumin: 4.3 g/dL (ref 3.5–5.0)
Alkaline Phosphatase: 79 U/L (ref 38–126)
Anion gap: 6 (ref 5–15)
BUN: 17 mg/dL (ref 8–23)
CO2: 28 mmol/L (ref 22–32)
Calcium: 9.4 mg/dL (ref 8.9–10.3)
Chloride: 102 mmol/L (ref 98–111)
Creatinine: 0.65 mg/dL (ref 0.44–1.00)
GFR, Estimated: >60 mL/min (ref >60)
Glucose, Bld: 104 mg/dL — ABNORMAL HIGH (ref 70–99)
Potassium: 4.1 mmol/L (ref 3.5–5.1)
Sodium: 136 mmol/L (ref 135–145)
Total Bilirubin: 0.4 mg/dL (ref 0.3–1.2)
Total Protein: 7.9 g/dL (ref 6.5–8.1)

## 2021-04-17 LAB — CBC WITH DIFFERENTIAL (CANCER CENTER ONLY)
Abs Immature Granulocytes: 0.02 10*3/uL (ref 0.00–0.07)
Basophils Absolute: 0 10*3/uL (ref 0.0–0.1)
Basophils Relative: 1 %
Eosinophils Absolute: 0.1 10*3/uL (ref 0.0–0.5)
Eosinophils Relative: 2 %
HCT: 35.1 % — ABNORMAL LOW (ref 36.0–46.0)
Hemoglobin: 12.4 g/dL (ref 12.0–15.0)
Immature Granulocytes: 0 %
Lymphocytes Relative: 21 %
Lymphs Abs: 1.1 10*3/uL (ref 0.7–4.0)
MCH: 35.1 pg — ABNORMAL HIGH (ref 26.0–34.0)
MCHC: 35.3 g/dL (ref 30.0–36.0)
MCV: 99.4 fL (ref 80.0–100.0)
Monocytes Absolute: 0.5 10*3/uL (ref 0.1–1.0)
Monocytes Relative: 10 %
Neutro Abs: 3.6 10*3/uL (ref 1.7–7.7)
Neutrophils Relative %: 66 %
Platelet Count: 211 10*3/uL (ref 150–400)
RBC: 3.53 MIL/uL — ABNORMAL LOW (ref 3.87–5.11)
RDW: 14.8 % (ref 11.5–15.5)
WBC Count: 5.5 10*3/uL (ref 4.0–10.5)
nRBC: 0 % (ref 0.0–0.2)

## 2021-04-17 LAB — MAGNESIUM: Magnesium: 1.6 mg/dL — ABNORMAL LOW (ref 1.7–2.4)

## 2021-04-17 MED ORDER — SODIUM CHLORIDE 0.9% FLUSH
10.0000 mL | INTRAVENOUS | Status: DC | PRN
  Administered 2021-04-17: 10 mL

## 2021-04-17 MED ORDER — SODIUM CHLORIDE 0.9% FLUSH
10.0000 mL | Freq: Once | INTRAVENOUS | Status: AC
  Administered 2021-04-17: 10 mL

## 2021-04-17 MED ORDER — SODIUM CHLORIDE 0.9 % IV SOLN
6.0000 mg/kg | Freq: Once | INTRAVENOUS | Status: AC
  Administered 2021-04-17: 500 mg via INTRAVENOUS
  Filled 2021-04-17: qty 20

## 2021-04-17 MED ORDER — OMEPRAZOLE 20 MG PO CPDR: 20.0000 mg | DELAYED_RELEASE_CAPSULE | Freq: Every day | ORAL | 2 refills | Status: DC

## 2021-04-17 MED ORDER — MAGNESIUM SULFATE 4 GM/100ML IV SOLN
4.0000 g | Freq: Once | INTRAVENOUS | Status: AC
  Administered 2021-04-17: 4 g via INTRAVENOUS
  Filled 2021-04-17: qty 100

## 2021-04-17 MED ORDER — SODIUM CHLORIDE 0.9 % IV SOLN: Freq: Once | INTRAVENOUS | Status: AC

## 2021-04-17 MED ORDER — HEPARIN SOD (PORK) LOCK FLUSH 100 UNIT/ML IV SOLN
500.0000 [IU] | Freq: Once | INTRAVENOUS | Status: AC | PRN
  Administered 2021-04-17: 500 [IU]

## 2021-04-17 NOTE — Telephone Encounter (Signed)
This nurse reached out to patient related to high co-pay at [facility] for her Xeloda.  This nurse advised that her insurance is not contracted with [facility] and they require that the medication be filled through [facility].  Advised that she has been out on a waitlist to see if a grant can be established to help cover cost of the medication however there are none available at this time.  Patient has acknowledged understanding and expressed appreciation.  She states that [facility].  No further questions or concerns at this time.

## 2021-04-17 NOTE — Patient Instructions (Addendum)
[facility]  Discharge Instructions: Thank you for choosing [facility] to provide your oncology and hematology care.   If you have a lab appointment with the [facility], please go directly to the [facility] and check in at the registration area.   Wear comfortable clothing and clothing appropriate for easy access to any Portacath or PICC line.   We strive to give you quality time with your provider. You may need to reschedule your appointment if you arrive late (15 or more minutes).  Arriving late affects you and other patients whose appointments are after yours.  Also, if you miss three or more appointments without notifying the office, you may be dismissed from the clinic at the providers discretion.      For prescription refill requests, have your pharmacy contact our office and allow 72 hours for refills to be completed.    Today you received the following chemotherapy and/or immunotherapy agents: Vectibix.      To help prevent nausea and vomiting after your treatment, we encourage you to take your nausea medication as directed.  BELOW ARE SYMPTOMS THAT SHOULD BE REPORTED IMMEDIATELY: *FEVER GREATER THAN 100.4 F (38 C) OR HIGHER *CHILLS OR SWEATING *NAUSEA AND VOMITING THAT IS NOT CONTROLLED WITH YOUR NAUSEA MEDICATION *UNUSUAL SHORTNESS OF BREATH *UNUSUAL BRUISING OR BLEEDING *URINARY PROBLEMS (pain or burning when urinating, or frequent urination) *BOWEL PROBLEMS (unusual diarrhea, constipation, pain near the anus) TENDERNESS IN MOUTH AND THROAT WITH OR WITHOUT PRESENCE OF ULCERS (sore throat, sores in mouth, or a toothache) UNUSUAL RASH, SWELLING OR PAIN  UNUSUAL VAGINAL DISCHARGE OR ITCHING   Items with * indicate a potential emergency and should be followed up as soon as possible or go to the Emergency Department if any problems should occur.  Please show the CHEMOTHERAPY ALERT CARD or IMMUNOTHERAPY ALERT CARD at check-in to  the Emergency Department and triage nurse.  Should you have questions after your visit or need to cancel or reschedule your appointment, please contact [facility]  Dept: [phone]  and follow the prompts.  Office hours are 8:00 a.m. to 4:30 p.m. Monday - Friday. Please note that voicemails left after 4:00 p.m. may not be returned until the following business day.  We are closed weekends and major holidays. You have access to a nurse at all times for urgent questions. Please call the main number to the clinic Dept: [phone] and follow the prompts.   For any non-urgent questions, you may also contact your provider using MyChart. We now offer e-Visits for anyone 80 and older to request care online for non-urgent symptoms. For details visit [email or web address].   Also download the MyChart app! Go to the app store, search "MyChart", open the app, select [facility], and log in with your MyChart username and password.  Due to Covid, a mask is required upon entering the hospital/clinic. If you do not have a mask, one will be given to you upon arrival. For doctor visits, patients may have 1 support person aged 2 or older with them. For treatment visits, patients cannot have anyone with them due to current Covid guidelines and our immunocompromised population.   Hypomagnesemia Hypomagnesemia is a condition in which the level of magnesium in the blood is too low. Magnesium is a mineral that is found in many foods. It is used in many different processes in the body. Hypomagnesemia can affect every organ in the body. In severe cases, it  can cause life-threatening problems. What are the causes? This condition may be caused by: Not getting enough magnesium in your diet or not having enough healthy foods to eat (malnutrition). Problems with magnesium absorption in the intestines. Dehydration. Excessive use of alcohol. Vomiting. Severe or long-term (chronic)  diarrhea. Some medicines, including medicines that make you urinate more often (diuretics). Certain diseases, such as kidney disease, diabetes, celiac disease, and overactive thyroid. What are the signs or symptoms? Symptoms of this condition include: Loss of appetite, nausea, and vomiting. Involuntary shaking or trembling of a body part (tremor). Muscle weakness or tingling in the arms and legs. Sudden tightening of muscles (muscle spasms). Confusion. Psychiatric issues, such as: Depression and irritability. Psychosis. A feeling of fluttering of the heart (palpitations). Seizures. These symptoms are more severe if magnesium levels drop suddenly. How is this diagnosed? This condition may be diagnosed based on: Your symptoms and medical history. A physical exam. Blood and urine tests. How is this treated? Treatment depends on the cause and the severity of the condition. It may be treated by: Taking a magnesium supplement. This can be taken in pill form. If the condition is severe, magnesium is usually given through an IV. Making changes to your diet. You may be directed to eat foods that have a lot of magnesium, such as green leafy vegetables, peas, beans, and nuts. Not drinking alcohol. If you are struggling not to drink, ask your health care provider for help. Follow these instructions at home: Eating and drinking   Make sure that your diet includes foods with magnesium. Foods that have a lot of magnesium in them include: Green leafy vegetables, such as spinach and broccoli. Beans and peas. Nuts and seeds, such as almonds and sunflower seeds. Whole grains, such as whole grain bread and fortified cereals. Drink fluids that contain salts and minerals (electrolytes), such as sports drinks, when you are active. Do not drink alcohol. General instructions Take over-the-counter and prescription medicines only as told by your health care provider. Take magnesium supplements as directed  if your health care provider tells you to take them. Have your magnesium levels monitored as told by your health care provider. Keep all follow-up visits. This is important. Contact a health care provider if: You get worse instead of better. Your symptoms return. Get help right away if: You develop severe muscle weakness. You have trouble breathing. You feel that your heart is racing. These symptoms may represent a serious problem that is an emergency. Do not wait to see if the symptoms will go away. Get medical help right away. Call your local emergency services (911 in the [place] Do not drive yourself to the hospital. Summary Hypomagnesemia is a condition in which the level of magnesium in the blood is too low. Hypomagnesemia can affect every organ in the body. Treatment may include eating more foods that contain magnesium, taking magnesium supplements, and not drinking alcohol. Have your magnesium levels monitored as told by your health care provider. This information is not intended to replace advice given to you by your health care provider. Make sure you discuss any questions you have with your health care provider. Document Revised: 07/23/2020 Document Reviewed: 07/23/2020 Elsevier Patient Education  [place].

## 2021-04-17 NOTE — Progress Notes (Signed)
[place]   [phone] [phone]   Clinic Follow up Note   Patient Care Team: [name], MD as PCP - General (Internal Medicine) [name], RN (Inactive) as Oncology Nurse Navigator [name], MD as Consulting Physician (Oncology) [name], MD as Consulting Physician (Gastroenterology)  Date of Service:  04/17/2021  CHIEF COMPLAINT: f/u of metastatic colon cancer  CURRENT THERAPY:  -Xeloda started 01/25/20, currently on 1500 mg BID days 1-7 q14d -Vectibix, q3weeks, started 02/06/20  ASSESSMENT & PLAN:  Lindsay [name] is a 66 y.o. female with   1. 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 MSS, [name]/[name] wildtype -Her 12/15/19 MRI abdomen showed 2 large liver masses indicating metastatic disease and bulky porta hepatis and retroperitoneal adenopathy. -Her EUS from 12/22/19 showed 3.5cm LN in porta hepatis region and biopsy confirmed metastatic adenocarcinoma.  Preliminary cytology sample is not adequate for any additional IHC study for the origin of tumor, the morphology does not support HCC. -Her EGD was negative for malignancy.  Her colonoscopy in March 2021 did show a large 2 cm polyps in descending colon, biopsy showed high-grade dysplasia.   -Liver biopsy confirmed adenocarcinoma, immunostain showed positive CDX2 and CK20, negative CK7, supporting primary colorectal cancer -PET scan showed known liver metastasis, and diffuse thoracic and abdominal adenopathy. There is a hypermetabolic 1.8 cm pulmonary nodule in left upper lobe, and focal hypermetabolic lesion in the splenic fixture of the colon, concerning for primary tumor. -She underwent repeat colonoscopy and biopsy by Dr. Benson Norway; additional IHC testing with TTF-1 shows focal staining, which can be seen in 5-10% of colorectal primary cancers as well as lung cancer. However given the above, the overall picture is still consistent with colorectal primary.  -She began first  line systemic FOLFOX on 11/18, goal is palliative. Tolerating well  -FO shows [name]/[name] wild-type, she is a candidate for EGFR inhibitor Panitumumab which was added with C2 -Restaging CT's 04/01/2020 and 06/20/20 showed interval reduction in size of liver, lung, and nodal metastases.  CEA normalized as of 04/16/2020 -Oxaliplatin was stopped after cycle 18 due to reaction.  She also had vasovagal-like reaction after Vectibix, before 5-FU on 08/02/20 but no issues on 10/02/20.  Due to skin cracking and concern for 5-FU infusion reactions she was switched to Xeloda.   -She has developed significant hand-foot syndrome and was reduced to 1500 mg BID 2 weeks on/1 week off on 01/20/21. She continues to have skin toxicity, so we will switch her to 7 days on/7 days off.  -she is tolerating 1500 mg BID 7 days on/7 days off well overall. She still has skin darkening, as to be expected. -lab reviewed, adequate for Vectibix today    2. Skin and nail changes, paronychia, hand-foot syndrome, hypomagnesia, acid reflux -secondary to 5-FU and worse on Xeloda -tolerating better on 1500 mg 7 days on/7 days off -she is currently taking magnesium 800 mg TID.  -she reports some acid reflux symptoms. I prescribed Prilosec for her.   3. Grade 1 neuropathy  -S/p cycle 5, oxaliplatin dose reduced and eventually discontinued after reaction 09/18/20 -Persistent neuropathy at the feet and fingertips, mild   4. Chronic Lower abdominal pain, Nausea -She has had chronic low abdominal pain for 5 years ranging up to 4 times a week. She notes her pain worsened in the last 3 years, but no work-up until 2021.   -Her pain is 5/10 at most but when high not managed on Motrin.  -she  has tramadol but does not take it much -encouraged her to use Ensure/boost if she has decreased appetite  -f/up with dietician -abdominal pain improved on chemo.  mild an intermittent. Does not require medication    4. HTN, DM, Heart Murmur, Arthritis -Per  PCP and Cardiologist Dr Einar Gip -DM controlled, no pre-existing neuropathy   5. 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 -She lives with husband and has brothers and sister in and out of town. Her only child passed in recent years.   6. Goals of care -we discussed the incurable but treatable nature of her stage IV cancer and general prognosis. She understands the prognosis is poor if she does not tolerate or respond well to chemo -she understands the goal is palliative -full code for now      PLAN: -proceed with Vectibix today -continue Xeloda at 1525m q12h, 7 days on and 7 days off -lab, flush, f/u, and Vectibix in 3 and 6 weeks   No problem-specific Assessment & Plan notes found for this encounter.   SUMMARY OF ONCOLOGIC HISTORY: Oncology History Overview Note  Cancer Staging No matching staging information was found for the patient.    metastatic colon cancer to liver  06/20/2019 Procedure   Colonoscopy by Dr MRush Landmark4/13/21  IMPRESSION -Seven 3 to 10 mm polyps in the sigmoid colon, in the transverse colon and in the escending colon, removed with a cold snare. Resected and retrireved.  -One 236mpolyp in the descending colon. Biopsies. Tattoes.  -Mediaum sized lipoma in the ascending colon.   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 See Comment B. Colon, Ascending, polyp, Polypectomy:  -TUBULAR ADENOMA -No high grade dysplasia or malignancy.  C. Colon, TRansverse, Polyo, polectomy:  -TUBULAR ADENOMA -No high grade dysplasia or malignancy.  D. Colon, Sigmoid, Polyp, Polypectomy:  -HYPERPLASTIC POLYP   11/07/2019 Imaging   USKoreabdomen 11/07/19  IMPRESSION: 1. Two solid masses in the liver are nonspecific. Recommend MRI abdomen with and without contrast for further evaluation.   12/15/2019 Imaging   MRI Abdomen 12/15/19  IMPRESSION: 1. There are two  large masses in the liver with appearance favoring metastatic disease or hepatocellular carcinoma or cholangiocarcinoma. A benign etiology is highly unlikely given the enhancement pattern and associated adenopathy. 2. Considerable porta hepatis and retroperitoneal adenopathy. Some of the confluent porta hepatis tumor is potentially infiltrative and abuts the pancreatic body along its right upper margin, making it difficult to completely exclude the possibility of pancreatic adenocarcinoma primary. Possibilities helpful in further workup might include tissue diagnosis, endoscopic ultrasound, or nuclear medicine PET-CT. 3. Pancreas divisum. 4. Lumbar spondylosis and degenerative disc disease. 5. Despite efforts by the technologist and patient, motion artifact is present on today's exam and could not be eliminated. This reduces exam sensitivity and specificity.   12/22/2019 Procedure   Upper Endoscopy by Dr HuBenson Norway0/15/21  IMPRESSION - One lymph node was visualized and measured in the porta hepatis region. Fine needle aspiration performed    12/22/2019 Initial Biopsy   A. LIVER, PORTA HEPATIS MASS, FINE NEEDLE 12/22/19 ASPIRATION:  FINAL MICROSCOPIC DIAGNOSIS:  - Malignant cells consistent with metastatic adenocarcinoma   01/01/2020 Initial Diagnosis   Intrahepatic cholangiocarcinoma ([place]  01/08/2020 Initial Biopsy   FINAL MICROSCOPIC DIAGNOSIS:   A. LIVER, LEFT LOBE, BIOPSY:  - Metastatic adenocarcinoma, consistent with a colorectal primary.  See  comment      COMMENT:  Immunohistochemical stains show the tumor cells are positive for CK20  and CDX2 but negative for CK7, consistent with above interpretation.  Dr. Burr Medico was notified on 01/10/2020   01/08/2020 Genetic Testing   [name]  [name] wildtype and [name]/[name] mutations which make her eligible for target biological agent Vectibix.   01/24/2020 Procedure   PAC placed 01/24/20   01/25/2020 -  Chemotherapy    first line FOLFOX starting 01/25/2020, Vextibix  added with C2 (02/06/20)   06/20/2020 Imaging   CT CAP  IMPRESSION: 1. Continued interval reduction in size and conspicuity of a subsolid nodule of the peripheral left upper lobe. 2. Unchanged prominent pretracheal and subcarinal lymph nodes. 3. Redemonstrated partially calcified low-attenuation liver masses, slightly decreased in size compared to prior examination. 4. Slight interval decrease in size of a portacaval lymph node or conglomerate and retroperitoneal lymph nodes. 5. Findings are consistent with continued treatment response of nodal, pulmonary, and hepatic metastatic disease. 6. Coronary artery disease.   Aortic Atherosclerosis (ICD10-I70.0).     10/09/2020 Imaging   IMPRESSION: 1. Slight decrease in size of dominant liver mass with smaller liver mass within 1-2 mm of prior measurement. 2. Stable appearance of celiac lymph and retroperitoneal lymph nodes. Dominant node with calcification in the gastrohepatic ligament as described. 3. Continued decrease in size of LEFT upper lobe nodule. 4. Three-vessel coronary artery calcification. 5. Aortic atherosclerosis.      INTERVAL HISTORY:  Lindsay [name] is here for a follow up of metastatic colon cancer. She was last seen by me on 03/27/21. She presents to the clinic alone.   All other systems were reviewed with the patient and are negative.  MEDICAL HISTORY:  Past Medical History:  Diagnosis Date   Arthritis    Diabetes mellitus without complication ([place])    Hypertension    met colon ca to liver 01/2020    SURGICAL HISTORY: Past Surgical History:  Procedure Laterality Date   ABDOMINAL HYSTERECTOMY     ANTERIOR AND POSTERIOR REPAIR WITH SACROSPINOUS FIXATION N/A 07/16/2016   Procedure: ANTERIOR AND POSTERIOR REPAIR WITH SACROSPINOUS FIXATION;  Surgeon: Everlene Farrier, MD;  Location: [facility];  Service: Gynecology;  Laterality: N/A;   CYSTOSCOPY  07/16/2016    Procedure: CYSTOSCOPY;  Surgeon: Everlene Farrier, MD;  Location: [facility];  Service: Gynecology;;   ESOPHAGOGASTRODUODENOSCOPY (EGD) WITH PROPOFOL N/A 12/22/2019   Procedure: ESOPHAGOGASTRODUODENOSCOPY (EGD) WITH PROPOFOL;  Surgeon: [name], MD;  Location: [facility];  Service: Endoscopy;  Laterality: N/A;   FINE NEEDLE ASPIRATION N/A 12/22/2019   Procedure: FINE NEEDLE ASPIRATION (FNA) LINEAR;  Surgeon: [name], MD;  Location: [facility];  Service: Endoscopy;  Laterality: N/A;   IR IMAGING GUIDED PORT INSERTION  01/24/2020   LAPAROSCOPIC VAGINAL HYSTERECTOMY WITH SALPINGECTOMY Bilateral 07/16/2016   Procedure: LAPAROSCOPIC ASSISTED VAGINAL HYSTERECTOMY WITH SALPINGECTOMY;  Surgeon: Everlene Farrier, MD;  Location: [facility];  Service: Gynecology;  Laterality: Bilateral;   MYOMECTOMY ABDOMINAL APPROACH     THYROID SURGERY     tyroid     UPPER ESOPHAGEAL ENDOSCOPIC ULTRASOUND (EUS) N/A 12/22/2019   Procedure: UPPER ESOPHAGEAL ENDOSCOPIC ULTRASOUND (EUS);  Surgeon: [name], MD;  Location: [facility];  Service: Endoscopy;  Laterality: N/A;    I have reviewed the social history and family history with the patient and they are unchanged from previous note.  ALLERGIES:  is allergic to oxaliplatin.  MEDICATIONS:  Current Outpatient Medications  Medication Sig Dispense Refill   omeprazole (PRILOSEC) 20 MG capsule Take 1 capsule (20  mg total) by mouth daily. 30 capsule 2   aspirin EC 81 MG tablet Take 1 tablet (81 mg total) by mouth daily. 90 tablet 3   atorvastatin (LIPITOR) 20 MG tablet Take 20 mg by mouth at bedtime.   1   capecitabine (XELODA) 500 MG tablet Take 3 tablets (1,500 mg total) by mouth 2 (two) times daily after a meal. Take for 7 days, followed by 7 days off. Repeat every 14 days. 84 tablet 1   clindamycin (CLINDAGEL) 1 % gel Apply topically 2 (two) times daily. 60 g 2   clobetasol cream (TEMOVATE) 9.41 % Apply 1 application topically daily.     doxycycline  (VIBRA-TABS) 100 MG tablet Take 1 tablet (100 mg total) by mouth 2 (two) times daily. 60 tablet 1   hydrochlorothiazide (MICROZIDE) 12.5 MG capsule Take 12.5 mg by mouth daily.     lidocaine-prilocaine (EMLA) cream Apply 1 application topically as needed. 30 g 1   magic mouthwash w/lidocaine SOLN Take 5 mLs by mouth 4 (four) times daily. 475 mL 2   magnesium oxide (MAG-OX) 400 MG tablet Take 2 tablets (800 mg total) by mouth 3 (three) times daily. 180 tablet 2   ondansetron (ZOFRAN) 8 MG tablet TAKE 1 TABLET BY MOUTH EVERY 8 HOURS AS NEEDED FOR NAUSEA OR VOMITING 20 tablet 0   potassium chloride SA (KLOR-CON M) 20 MEQ tablet Take 1 tablet (20 mEq total) by mouth 2 (two) times daily. 180 each 1   prochlorperazine (COMPAZINE) 10 MG tablet Take 1 tablet (10 mg total) by mouth every 6 (six) hours as needed for nausea or vomiting. 30 tablet 2   spironolactone (ALDACTONE) 25 MG tablet Take 25 mg by mouth daily with breakfast.     urea (CARMOL) 10 % cream Apply topically 2 (two) times daily. 71 g 2   valACYclovir (VALTREX) 1000 MG tablet Take 1 tablet (1,000 mg total) by mouth 2 (two) times daily. 10 tablet 0   verapamil (CALAN) 120 MG tablet Take 120 mg by mouth 3 (three) times daily.     No current facility-administered medications for this visit.   Facility-Administered Medications Ordered in Other Visits  Medication Dose Route Frequency Provider Last Rate Last Admin   sodium chloride flush (NS) 0.9 % injection 10 mL  10 mL Intracatheter PRN [name], MD   10 mL at 04/17/21 1643    PHYSICAL EXAMINATION: ECOG PERFORMANCE STATUS: 1 - Symptomatic but completely ambulatory  Vitals:   04/17/21 1244  BP: 131/82  Pulse: 79  Resp: 18  Temp: 98.4 F (36.9 C)  SpO2: 100%   Wt Readings from Last 3 Encounters:  04/17/21 184 lb 12.8 oz (83.8 kg)  03/27/21 180 lb 1.9 oz (81.7 kg)  03/05/21 179 lb 3.2 oz (81.3 kg)     GENERAL:alert, no distress and comfortable SKIN: skin color normal, no rashes  or significant lesions EYES: normal, Conjunctiva are pink and non-injected, sclera clear  NEURO: alert & oriented x 3 with fluent speech  LABORATORY DATA:  I have reviewed the data as listed CBC Latest Ref Rng & Units 04/17/2021 03/27/2021 03/05/2021  WBC 4.0 - 10.5 K/uL 5.5 6.1 5.2  Hemoglobin 12.0 - 15.0 g/dL 12.4 12.2 11.3(L)  Hematocrit 36.0 - 46.0 % 35.1(L) 35.3(L) 33.2(L)  Platelets 150 - 400 K/uL 211 239 213     CMP Latest Ref Rng & Units 04/17/2021 03/27/2021 03/05/2021  Glucose 70 - 99 mg/dL 104(H) 107(H) 104(H)  BUN 8 - 23 mg/dL  17 24(H) 22  Creatinine 0.44 - 1.00 mg/dL 0.65 0.70 0.80  Sodium 135 - 145 mmol/L 136 137 136  Potassium 3.5 - 5.1 mmol/L 4.1 4.5 4.7  Chloride 98 - 111 mmol/L 102 102 103  CO2 22 - 32 mmol/L 28 26 26   Calcium 8.9 - 10.3 mg/dL 9.4 10.7(H) 10.4(H)  Total Protein 6.5 - 8.1 g/dL 7.9 8.3(H) 8.3(H)  Total Bilirubin 0.3 - 1.2 mg/dL 0.4 0.4 0.6  Alkaline Phos 38 - 126 U/L 79 62 60  AST 15 - 41 U/L 25 27 26   ALT 0 - 44 U/L 27 26 25       RADIOGRAPHIC STUDIES: I have personally reviewed the radiological images as listed and agreed with the findings in the report. No results found.    No orders of the defined types were placed in this encounter.  All questions were answered. The patient knows to call the clinic with any problems, questions or concerns. No barriers to learning was detected. The total time spent in the appointment was 30 minutes.     [name], MD 04/17/2021   I, Wilburn Mylar, am acting as scribe for [name], MD.   I have reviewed the above documentation for accuracy and completeness, and I agree with the above.

## 2021-04-18 ENCOUNTER — Other Ambulatory Visit: Payer: Self-pay

## 2021-04-18 ENCOUNTER — Telehealth: Payer: Self-pay | Admitting: Hematology

## 2021-04-18 NOTE — Telephone Encounter (Signed)
Scheduled follow-up appointment per 2/9 los. Patient is aware. °

## 2021-05-08 ENCOUNTER — Encounter: Payer: Self-pay | Admitting: Hematology

## 2021-05-08 ENCOUNTER — Inpatient Hospital Stay: Payer: Medicare (Managed Care)

## 2021-05-08 ENCOUNTER — Other Ambulatory Visit: Payer: Self-pay

## 2021-05-08 ENCOUNTER — Inpatient Hospital Stay (HOSPITAL_BASED_OUTPATIENT_CLINIC_OR_DEPARTMENT_OTHER): Payer: Medicare (Managed Care) | Admitting: Hematology

## 2021-05-08 ENCOUNTER — Inpatient Hospital Stay: Payer: Medicare (Managed Care) | Attending: Hematology

## 2021-05-08 VITALS — BP 143/83 | HR 84 | Temp 98.5°F | Resp 18 | Ht 66.0 in | Wt 187.7 lb

## 2021-05-08 DIAGNOSIS — Z86010 Personal history of colonic polyps: Secondary | ICD-10-CM | POA: Insufficient documentation

## 2021-05-08 DIAGNOSIS — C221 Intrahepatic bile duct carcinoma: Secondary | ICD-10-CM

## 2021-05-08 DIAGNOSIS — C189 Malignant neoplasm of colon, unspecified: Secondary | ICD-10-CM | POA: Diagnosis not present

## 2021-05-08 DIAGNOSIS — Z79899 Other long term (current) drug therapy: Secondary | ICD-10-CM | POA: Diagnosis not present

## 2021-05-08 DIAGNOSIS — I10 Essential (primary) hypertension: Secondary | ICD-10-CM | POA: Insufficient documentation

## 2021-05-08 DIAGNOSIS — Z5112 Encounter for antineoplastic immunotherapy: Secondary | ICD-10-CM | POA: Insufficient documentation

## 2021-05-08 DIAGNOSIS — C787 Secondary malignant neoplasm of liver and intrahepatic bile duct: Secondary | ICD-10-CM | POA: Diagnosis present

## 2021-05-08 DIAGNOSIS — Z7189 Other specified counseling: Secondary | ICD-10-CM

## 2021-05-08 DIAGNOSIS — R011 Cardiac murmur, unspecified: Secondary | ICD-10-CM | POA: Insufficient documentation

## 2021-05-08 DIAGNOSIS — E1140 Type 2 diabetes mellitus with diabetic neuropathy, unspecified: Secondary | ICD-10-CM | POA: Insufficient documentation

## 2021-05-08 DIAGNOSIS — C19 Malignant neoplasm of rectosigmoid junction: Secondary | ICD-10-CM | POA: Diagnosis present

## 2021-05-08 DIAGNOSIS — Z95828 Presence of other vascular implants and grafts: Secondary | ICD-10-CM

## 2021-05-08 DIAGNOSIS — T451X5D Adverse effect of antineoplastic and immunosuppressive drugs, subsequent encounter: Secondary | ICD-10-CM | POA: Diagnosis not present

## 2021-05-08 DIAGNOSIS — G620 Drug-induced polyneuropathy: Secondary | ICD-10-CM | POA: Diagnosis not present

## 2021-05-08 DIAGNOSIS — C7802 Secondary malignant neoplasm of left lung: Secondary | ICD-10-CM | POA: Insufficient documentation

## 2021-05-08 DIAGNOSIS — Z7984 Long term (current) use of oral hypoglycemic drugs: Secondary | ICD-10-CM | POA: Insufficient documentation

## 2021-05-08 DIAGNOSIS — C778 Secondary and unspecified malignant neoplasm of lymph nodes of multiple regions: Secondary | ICD-10-CM | POA: Diagnosis not present

## 2021-05-08 DIAGNOSIS — Z87891 Personal history of nicotine dependence: Secondary | ICD-10-CM | POA: Diagnosis not present

## 2021-05-08 LAB — CMP (CANCER CENTER ONLY)
ALT: 30 U/L (ref 0–44)
AST: 30 U/L (ref 15–41)
Albumin: 4.3 g/dL (ref 3.5–5.0)
Alkaline Phosphatase: 89 U/L (ref 38–126)
Anion gap: 7 (ref 5–15)
BUN: 15 mg/dL (ref 8–23)
CO2: 30 mmol/L (ref 22–32)
Calcium: 9.9 mg/dL (ref 8.9–10.3)
Chloride: 99 mmol/L (ref 98–111)
Creatinine: 0.8 mg/dL (ref 0.44–1.00)
GFR, Estimated: >60 mL/min (ref >60)
Glucose, Bld: 109 mg/dL — ABNORMAL HIGH (ref 70–99)
Potassium: 4.1 mmol/L (ref 3.5–5.1)
Sodium: 136 mmol/L (ref 135–145)
Total Bilirubin: 0.3 mg/dL (ref 0.3–1.2)
Total Protein: 7.9 g/dL (ref 6.5–8.1)

## 2021-05-08 LAB — CBC WITH DIFFERENTIAL (CANCER CENTER ONLY)
Abs Immature Granulocytes: 0.03 10*3/uL (ref 0.00–0.07)
Basophils Absolute: 0 10*3/uL (ref 0.0–0.1)
Basophils Relative: 1 %
Eosinophils Absolute: 0.1 10*3/uL (ref 0.0–0.5)
Eosinophils Relative: 1 %
HCT: 35.7 % — ABNORMAL LOW (ref 36.0–46.0)
Hemoglobin: 12.6 g/dL (ref 12.0–15.0)
Immature Granulocytes: 1 %
Lymphocytes Relative: 25 %
Lymphs Abs: 1.3 10*3/uL (ref 0.7–4.0)
MCH: 34.5 pg — ABNORMAL HIGH (ref 26.0–34.0)
MCHC: 35.3 g/dL (ref 30.0–36.0)
MCV: 97.8 fL (ref 80.0–100.0)
Monocytes Absolute: 0.5 10*3/uL (ref 0.1–1.0)
Monocytes Relative: 10 %
Neutro Abs: 3.4 10*3/uL (ref 1.7–7.7)
Neutrophils Relative %: 62 %
Platelet Count: 241 10*3/uL (ref 150–400)
RBC: 3.65 MIL/uL — ABNORMAL LOW (ref 3.87–5.11)
RDW: 15 % (ref 11.5–15.5)
WBC Count: 5.4 10*3/uL (ref 4.0–10.5)
nRBC: 0 % (ref 0.0–0.2)

## 2021-05-08 LAB — CEA (IN HOUSE-CHCC): CEA (CHCC-In House): <1 ng/mL (ref 0.00–5.00)

## 2021-05-08 LAB — MAGNESIUM: Magnesium: 1.6 mg/dL — ABNORMAL LOW (ref 1.7–2.4)

## 2021-05-08 MED ORDER — SODIUM CHLORIDE 0.9% FLUSH
10.0000 mL | Freq: Once | INTRAVENOUS | Status: AC
  Administered 2021-05-08: 10 mL

## 2021-05-08 MED ORDER — SODIUM CHLORIDE 0.9 % IV SOLN: Freq: Once | INTRAVENOUS | Status: AC

## 2021-05-08 MED ORDER — CAPECITABINE 500 MG PO TABS: 1500.0000 mg | ORAL_TABLET | Freq: Two times a day (BID) | ORAL | 1 refills | Status: DC

## 2021-05-08 MED ORDER — SODIUM CHLORIDE 0.9 % IV SOLN
6.0000 mg/kg | Freq: Once | INTRAVENOUS | Status: AC
  Administered 2021-05-08: 500 mg via INTRAVENOUS
  Filled 2021-05-08: qty 20

## 2021-05-08 MED ORDER — MAGNESIUM SULFATE 4 GM/100ML IV SOLN
4.0000 g | Freq: Once | INTRAVENOUS | Status: AC
  Administered 2021-05-08: 4 g via INTRAVENOUS
  Filled 2021-05-08: qty 100

## 2021-05-08 NOTE — Progress Notes (Signed)
Lindsay [name]   [phone] Fax:([phone]) [phone]   Clinic Follow up Note   Patient Care Team: [name], MD as PCP - General (Internal Medicine) [name], RN (Inactive) as Oncology Nurse Navigator [name], MD as Consulting Physician (Oncology) [name], MD as Consulting Physician (Gastroenterology)  Date of Service:  05/08/2021  CHIEF COMPLAINT: f/u of metastatic colon cancer  CURRENT THERAPY:  -Xeloda started 01/25/20, currently on 1500 mg BID days 1-7 q14d -Vectibix, q3weeks, started 02/06/20  ASSESSMENT & PLAN:  Lindsay [name] is a 66 y.o. female with   1. 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 MSS, [name]/[name] wildtype -Her 12/15/19 MRI abdomen showed 2 large liver masses indicating metastatic disease and bulky porta hepatis and retroperitoneal adenopathy. -Her EUS from 12/22/19 showed 3.5cm LN in porta hepatis region and biopsy confirmed metastatic adenocarcinoma.  Preliminary cytology sample is not adequate for any additional IHC study for the origin of tumor, the morphology does not support HCC. -Her EGD was negative for malignancy.  Her colonoscopy in March 2021 did show a large 2 cm polyps in descending colon, biopsy showed high-grade dysplasia.   -Liver biopsy confirmed adenocarcinoma, immunostain showed positive CDX2 and CK20, negative CK7, supporting primary colorectal cancer -PET scan showed known liver metastasis, and diffuse thoracic and abdominal adenopathy. There is a hypermetabolic 1.8 cm pulmonary nodule in left upper lobe, and focal hypermetabolic lesion in the splenic fixture of the colon, concerning for primary tumor. -She underwent repeat colonoscopy and biopsy by Dr. Benson Norway; additional IHC testing with TTF-1 shows focal staining, which can be seen in 5-10% of colorectal primary cancers as well as lung cancer. However given the above, the overall picture is still consistent with colorectal primary.  -She began first  line systemic FOLFOX on 11/18, goal is palliative. Tolerating well  -FO shows [name]/[name] wild-type, she is a candidate for EGFR inhibitor Panitumumab which was added with C2 -Restaging CT's 04/01/20 and 06/20/20 showed interval reduction in size of liver, lung, and nodal metastases.  CEA normalized as of 04/16/20 -Oxaliplatin was stopped after cycle 18 due to reaction.  She also had vasovagal-like reaction after Vectibix, before 5-FU on 08/02/20 but no issues on 10/02/20. Due to skin cracking and concern for 5-FU infusion reactions she was switched to Xeloda.   -She has developed significant hand-foot syndrome and was reduced to 1500 mg BID 2 weeks on/1 week off on 01/20/21. She continues to have skin toxicity, so we switched her to 7 days on/7 days off.  -most recent CT CAP on 02/14/21 showed overall stable disease. We will plan to repeat CT next month. -she is tolerating 1500 mg BID 7 days on/7 days off well overall. She still has skin changes, as to be expected. -lab reviewed, adequate for Vectibix today   -Plan to repeat CT scan in mid April.  2. Chemo Toxicities: Skin and nail changes, hand-foot syndrome, hypomagnesia, acid reflux -secondary to 5-FU and worse on Xeloda -tolerating better on 1500 mg 7 days on/7 days off -she is currently taking magnesium 800 mg TID.  -she has prilosec for acid reflux symptoms.    3. Grade 1 neuropathy  -S/p cycle 5, oxaliplatin dose reduced and eventually discontinued after reaction 09/18/20 -Persistent neuropathy at the feet and fingertips, mild   4. Chronic Lower abdominal pain, Nausea -She has had chronic low abdominal pain for 5 years ranging up to 4 times a week. She notes her pain worsened in the last 3 years,  but no work-up until 2021.   -encouraged her to use Ensure/boost if she has decreased appetite  -f/up with dietician -abdominal pain improved on chemo.  mild an intermittent. Does not require medication    5. HTN, DM, Heart Murmur, Arthritis -Per  PCP and Cardiologist Dr Einar Gip -DM controlled, no pre-existing neuropathy   6. 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 -She lives with husband and has brothers and sister in and out of town. Her only child passed in recent years.   7. Goals of care -we discussed the incurable but treatable nature of her stage IV cancer and general prognosis. She understands the prognosis is poor if she does not tolerate or respond well to chemo -she understands the goal is palliative -full code for now      PLAN: -proceed with Vectibix today -continue Xeloda at 1559m q12h, 7 days on and 7 days off -lab, flush, f/u, and Vectibix in 3 weeks as scheduled -f/u and Vectibix in 6 weeks with lab/flush and CT several days before   No problem-specific Assessment & Plan notes found for this encounter.   SUMMARY OF ONCOLOGIC HISTORY: Oncology History Overview Note  Cancer Staging No matching staging information was found for the patient.    metastatic colon cancer to liver  06/20/2019 Procedure   Colonoscopy by Dr MRush Landmark4/13/21  IMPRESSION -Seven 3 to 10 mm polyps in the sigmoid colon, in the transverse colon and in the escending colon, removed with a cold snare. Resected and retrireved.  -One 240mpolyp in the descending colon. Biopsies. Tattoes.  -Mediaum sized lipoma in the ascending colon.   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 See Comment B. Colon, Ascending, polyp, Polypectomy:  -TUBULAR ADENOMA -No high grade dysplasia or malignancy.  C. Colon, TRansverse, Polyo, polectomy:  -TUBULAR ADENOMA -No high grade dysplasia or malignancy.  D. Colon, Sigmoid, Polyp, Polypectomy:  -HYPERPLASTIC POLYP   11/07/2019 Imaging   US[place]oreabdomen 11/07/19  IMPRESSION: 1. Two solid masses in the liver are nonspecific. Recommend MRI abdomen with and without contrast for further evaluation.    12/15/2019 Imaging   MRI Abdomen 12/15/19  IMPRESSION: 1. There are two large masses in the liver with appearance favoring metastatic disease or hepatocellular carcinoma or cholangiocarcinoma. A benign etiology is highly unlikely given the enhancement pattern and associated adenopathy. 2. Considerable porta hepatis and retroperitoneal adenopathy. Some of the confluent porta hepatis tumor is potentially infiltrative and abuts the pancreatic body along its right upper margin, making it difficult to completely exclude the possibility of pancreatic adenocarcinoma primary. Possibilities helpful in further workup might include tissue diagnosis, endoscopic ultrasound, or nuclear medicine PET-CT. 3. Pancreas divisum. 4. Lumbar spondylosis and degenerative disc disease. 5. Despite efforts by the technologist and patient, motion artifact is present on today's exam and could not be eliminated. This reduces exam sensitivity and specificity.   12/22/2019 Procedure   Upper Endoscopy by Dr HuBenson Norway0/15/21  IMPRESSION - One lymph node was visualized and measured in the porta hepatis region. Fine needle aspiration performed    12/22/2019 Initial Biopsy   A. LIVER, PORTA HEPATIS MASS, FINE NEEDLE 12/22/19 ASPIRATION:  FINAL MICROSCOPIC DIAGNOSIS:  - Malignant cells consistent with metastatic adenocarcinoma   01/01/2020 Initial Diagnosis   [facility]atic [facility]angiocarcinoma ([facility]  01/08/2020 Initial Biopsy   FINAL MICROSCOPIC DIAGNOSIS:   A. LIVER, LEFT LOBE, BIOPSY:  - Metastatic adenocarcinoma, consistent with a colorectal primary.  See  comment      COMMENT:   Immunohistochemical stains show the tumor cells are positive for CK20  and CDX2 but negative for CK7, consistent with above interpretation.  Dr. Burr Medico was notified on 01/10/2020   01/08/2020 Genetic Testing   [name]  [name] wildtype and [name]/[name] mutations which make her eligible for target biological agent Vectibix.    01/24/2020 Procedure   PAC placed 01/24/20   01/25/2020 -  Chemotherapy   first line FOLFOX starting 01/25/2020, Vextibix  added with C2 (02/06/20)   06/20/2020 Imaging   CT CAP  IMPRESSION: 1. Continued interval reduction in size and conspicuity of a subsolid nodule of the peripheral left upper lobe. 2. Unchanged prominent pretracheal and subcarinal lymph nodes. 3. Redemonstrated partially calcified low-attenuation liver masses, slightly decreased in size compared to prior examination. 4. Slight interval decrease in size of a portacaval lymph node or conglomerate and retroperitoneal lymph nodes. 5. Findings are consistent with continued treatment response of nodal, pulmonary, and hepatic metastatic disease. 6. Coronary artery disease.   Aortic Atherosclerosis (ICD10-I70.0).     10/09/2020 Imaging   IMPRESSION: 1. Slight decrease in size of dominant liver mass with smaller liver mass within 1-2 mm of prior measurement. 2. Stable appearance of celiac lymph and retroperitoneal lymph nodes. Dominant node with calcification in the gastrohepatic ligament as described. 3. Continued decrease in size of LEFT upper lobe nodule. 4. Three-vessel coronary artery calcification. 5. Aortic atherosclerosis.      INTERVAL HISTORY:  Lindsay [name] is here for a follow up of metastatic colon cancer. She was last seen by me on 04/17/21. She presents to the clinic alone. She reports she still experiences skin toxicities from Xeloda, such as cracking in her hands and peeling to her feet. She denies issues walking. She reports continued heartburn, which will limit her activities when it occurs.   All other systems were reviewed with the patient and are negative.  MEDICAL HISTORY:  Past Medical History:  Diagnosis Date   Arthritis    Diabetes mellitus without complication ([place])    Hypertension    met colon ca to liver 01/2020    SURGICAL HISTORY: Past Surgical History:  Procedure  Laterality Date   ABDOMINAL HYSTERECTOMY     ANTERIOR AND POSTERIOR REPAIR WITH SACROSPINOUS FIXATION N/A 07/16/2016   Procedure: ANTERIOR AND POSTERIOR REPAIR WITH SACROSPINOUS FIXATION;  Surgeon: Everlene Farrier, MD;  Location: [facility];  Service: Gynecology;  Laterality: N/A;   CYSTOSCOPY  07/16/2016   Procedure: CYSTOSCOPY;  Surgeon: Everlene Farrier, MD;  Location: [facility];  Service: Gynecology;;   ESOPHAGOGASTRODUODENOSCOPY (EGD) WITH PROPOFOL N/A 12/22/2019   Procedure: ESOPHAGOGASTRODUODENOSCOPY (EGD) WITH PROPOFOL;  Surgeon: [name], MD;  Location: [facility];  Service: Endoscopy;  Laterality: N/A;   FINE NEEDLE ASPIRATION N/A 12/22/2019   Procedure: FINE NEEDLE ASPIRATION (FNA) LINEAR;  Surgeon: [name], MD;  Location: [facility];  Service: Endoscopy;  Laterality: N/A;   IR IMAGING GUIDED PORT INSERTION  01/24/2020   LAPAROSCOPIC VAGINAL HYSTERECTOMY WITH SALPINGECTOMY Bilateral 07/16/2016   Procedure: LAPAROSCOPIC ASSISTED VAGINAL HYSTERECTOMY WITH SALPINGECTOMY;  Surgeon: Everlene Farrier, MD;  Location: [facility];  Service: Gynecology;  Laterality: Bilateral;   MYOMECTOMY ABDOMINAL APPROACH     THYROID SURGERY     tyroid     UPPER ESOPHAGEAL ENDOSCOPIC ULTRASOUND (EUS) N/A 12/22/2019   Procedure: UPPER ESOPHAGEAL ENDOSCOPIC ULTRASOUND (EUS);  Surgeon: [name], MD;  Location: [facility];  Service: Endoscopy;  Laterality: N/A;    I  have reviewed the social history and family history with the patient and they are unchanged from previous note.  ALLERGIES:  is allergic to oxaliplatin.  MEDICATIONS:  Current Outpatient Medications  Medication Sig Dispense Refill   aspirin EC 81 MG tablet Take 1 tablet (81 mg total) by mouth daily. 90 tablet 3   atorvastatin (LIPITOR) 20 MG tablet Take 20 mg by mouth at bedtime.   1   capecitabine (XELODA) 500 MG tablet Take 3 tablets (1,500 mg total) by mouth 2 (two) times daily after a meal. Take for 7 days, followed by 7 days off.  Repeat every 14 days. 84 tablet 1   clindamycin (CLINDAGEL) 1 % gel Apply topically 2 (two) times daily. 60 g 2   clobetasol cream (TEMOVATE) 0.03 % Apply 1 application topically daily.     hydrochlorothiazide (MICROZIDE) 12.5 MG capsule Take 12.5 mg by mouth daily.     lidocaine-prilocaine (EMLA) cream Apply 1 application topically as needed. 30 g 1   magic mouthwash w/lidocaine SOLN Take 5 mLs by mouth 4 (four) times daily. 475 mL 2   magnesium oxide (MAG-OX) 400 MG tablet Take 2 tablets (800 mg total) by mouth 3 (three) times daily. 180 tablet 2   omeprazole (PRILOSEC) 20 MG capsule Take 1 capsule (20 mg total) by mouth daily. 30 capsule 2   ondansetron (ZOFRAN) 8 MG tablet TAKE 1 TABLET BY MOUTH EVERY 8 HOURS AS NEEDED FOR NAUSEA OR VOMITING 20 tablet 0   potassium chloride SA (KLOR-CON M) 20 MEQ tablet Take 1 tablet (20 mEq total) by mouth 2 (two) times daily. 180 each 1   prochlorperazine (COMPAZINE) 10 MG tablet Take 1 tablet (10 mg total) by mouth every 6 (six) hours as needed for nausea or vomiting. 30 tablet 2   spironolactone (ALDACTONE) 25 MG tablet Take 25 mg by mouth daily with breakfast.     urea (CARMOL) 10 % cream Apply topically 2 (two) times daily. 71 g 2   valACYclovir (VALTREX) 1000 MG tablet Take 1 tablet (1,000 mg total) by mouth 2 (two) times daily. 10 tablet 0   verapamil (CALAN) 120 MG tablet Take 120 mg by mouth 3 (three) times daily.     No current facility-administered medications for this visit.    PHYSICAL EXAMINATION: ECOG PERFORMANCE STATUS: 1 - Symptomatic but completely ambulatory  Vitals:   05/08/21 1148  BP: (!) 143/83  Pulse: 84  Resp: 18  Temp: 98.5 F (36.9 C)  SpO2: 100%   Wt Readings from Last 3 Encounters:  05/08/21 187 lb 11.2 oz (85.1 kg)  04/17/21 184 lb 12.8 oz (83.8 kg)  03/27/21 180 lb 1.9 oz (81.7 kg)     GENERAL:alert, no distress and comfortable SKIN: skin color, texture, turgor are normal, no rashes or significant  lesions EYES: normal, Conjunctiva are pink and non-injected, sclera clear  NECK: supple, thyroid normal size, non-tender, without nodularity LYMPH:  no palpable lymphadenopathy in the cervical, axillary  LUNGS: clear to auscultation and percussion with normal breathing effort HEART: regular rate & rhythm and no murmurs and no lower extremity edema ABDOMEN:abdomen soft, non-tender and normal bowel sounds Musculoskeletal:no cyanosis of digits and no clubbing  NEURO: alert & oriented x 3 with fluent speech, no focal motor/sensory deficits  LABORATORY DATA:  I have reviewed the data as listed CBC Latest Ref Rng & Units 05/08/2021 04/17/2021 03/27/2021  WBC 4.0 - 10.5 K/uL 5.4 5.5 6.1  Hemoglobin 12.0 - 15.0 g/dL 12.6 12.4 12.2  Hematocrit 36.0 - 46.0 % 35.7(L) 35.1(L) 35.3(L)  Platelets 150 - 400 K/uL 241 211 239     CMP Latest Ref Rng & Units 05/08/2021 04/17/2021 03/27/2021  Glucose 70 - 99 mg/dL 109(H) 104(H) 107(H)  BUN 8 - 23 mg/dL 15 17 24(H)  Creatinine 0.44 - 1.00 mg/dL 0.80 0.65 0.70  Sodium 135 - 145 mmol/L 136 136 137  Potassium 3.5 - 5.1 mmol/L 4.1 4.1 4.5  Chloride 98 - 111 mmol/L 99 102 102  CO2 22 - 32 mmol/L 30 28 26   Calcium 8.9 - 10.3 mg/dL 9.9 9.4 10.7(H)  Total Protein 6.5 - 8.1 g/dL 7.9 7.9 8.3(H)  Total Bilirubin 0.3 - 1.2 mg/dL 0.3 0.4 0.4  Alkaline Phos 38 - 126 U/L 89 79 62  AST 15 - 41 U/L 30 25 27   ALT 0 - 44 U/L 30 27 26       RADIOGRAPHIC STUDIES: I have personally reviewed the radiological images as listed and agreed with the findings in the report. No results found.    Orders Placed This Encounter  Procedures   CT CHEST ABDOMEN PELVIS W CONTRAST    Standing Status:   Future    Standing Expiration Date:   05/09/2022    Order Specific Question:   Preferred imaging location?    Answer:   [facility]    Order Specific Question:   Is Oral Contrast requested for this exam?    Answer:   Yes, Per Radiology protocol   All questions were answered. The  patient knows to call the clinic with any problems, questions or concerns. No barriers to learning was detected. The total time spent in the appointment was 30 minutes.     [name], MD 05/08/2021   I, Wilburn Mylar, am acting as scribe for [name], MD.   I have reviewed the above documentation for accuracy and completeness, and I agree with the above.

## 2021-05-08 NOTE — Patient Instructions (Signed)
[facility]  Discharge Instructions: ?Thank you for choosing [facility] to provide your oncology and hematology care.  ? ?If you have a lab appointment with the [facility], please go directly to the [facility] and check in at the registration area. ?  ?Wear comfortable clothing and clothing appropriate for easy access to any Portacath or PICC line.  ? ?We strive to give you quality time with your provider. You may need to reschedule your appointment if you arrive late (15 or more minutes).  Arriving late affects you and other patients whose appointments are after yours.  Also, if you miss three or more appointments without notifying the office, you may be dismissed from the clinic at the provider?s discretion.    ?  ?For prescription refill requests, have your pharmacy contact our office and allow 72 hours for refills to be completed.   ? ?Today you received the following chemotherapy and/or immunotherapy agents: Vectibix.    ?  ?To help prevent nausea and vomiting after your treatment, we encourage you to take your nausea medication as directed. ? ?BELOW ARE SYMPTOMS THAT SHOULD BE REPORTED IMMEDIATELY: ?*FEVER GREATER THAN 100.4 F (38 ?C) OR HIGHER ?*CHILLS OR SWEATING ?*NAUSEA AND VOMITING THAT IS NOT CONTROLLED WITH YOUR NAUSEA MEDICATION ?*UNUSUAL SHORTNESS OF BREATH ?*UNUSUAL BRUISING OR BLEEDING ?*URINARY PROBLEMS (pain or burning when urinating, or frequent urination) ?*BOWEL PROBLEMS (unusual diarrhea, constipation, pain near the anus) ?TENDERNESS IN MOUTH AND THROAT WITH OR WITHOUT PRESENCE OF ULCERS (sore throat, sores in mouth, or a toothache) ?UNUSUAL RASH, SWELLING OR PAIN  ?UNUSUAL VAGINAL DISCHARGE OR ITCHING  ? ?Items with * indicate a potential emergency and should be followed up as soon as possible or go to the Emergency Department if any problems should occur. ? ?Please show the CHEMOTHERAPY ALERT CARD or IMMUNOTHERAPY ALERT CARD at check-in to  the Emergency Department and triage nurse. ? ?Should you have questions after your visit or need to cancel or reschedule your appointment, please contact [facility]  Dept: [phone]  and follow the prompts.  Office hours are 8:00 a.m. to 4:30 p.m. Monday - Friday. Please note that voicemails left after 4:00 p.m. may not be returned until the following business day.  We are closed weekends and major holidays. You have access to a nurse at all times for urgent questions. Please call the main number to the clinic Dept: [phone] and follow the prompts. ? ? ?For any non-urgent questions, you may also contact your provider using MyChart. We now offer e-Visits for anyone 19 and older to request care online for non-urgent symptoms. For details visit [email or web address]. ?  ?Also download the MyChart app! Go to the app store, search "MyChart", open the app, select [facility], and log in with your MyChart username and password. ? ?Due to Covid, a mask is required upon entering the hospital/clinic. If you do not have a mask, one will be given to you upon arrival. For doctor visits, patients may have 1 support person aged 75 or older with them. For treatment visits, patients cannot have anyone with them due to current Covid guidelines and our immunocompromised population.  ? ?Hypomagnesemia ?Hypomagnesemia is a condition in which the level of magnesium in the blood is too low. Magnesium is a mineral that is found in many foods. It is used in many different processes in the body. Hypomagnesemia can affect every organ in the body. In severe cases, it  can cause life-threatening problems. ?What are the causes? ?This condition may be caused by: ?Not getting enough magnesium in your diet or not having enough healthy foods to eat (malnutrition). ?Problems with magnesium absorption in the intestines. ?Dehydration. ?Excessive use of alcohol. ?Vomiting. ?Severe or long-term (chronic)  diarrhea. ?Some medicines, including medicines that make you urinate more often (diuretics). ?Certain diseases, such as kidney disease, diabetes, celiac disease, and overactive thyroid. ?What are the signs or symptoms? ?Symptoms of this condition include: ?Loss of appetite, nausea, and vomiting. ?Involuntary shaking or trembling of a body part (tremor). ?Muscle weakness or tingling in the arms and legs. ?Sudden tightening of muscles (muscle spasms). ?Confusion. ?Psychiatric issues, such as: ?Depression and irritability. ?Psychosis. ?A feeling of fluttering of the heart (palpitations). ?Seizures. ?These symptoms are more severe if magnesium levels drop suddenly. ?How is this diagnosed? ?This condition may be diagnosed based on: ?Your symptoms and medical history. ?A physical exam. ?Blood and urine tests. ?How is this treated? ?Treatment depends on the cause and the severity of the condition. It may be treated by: ?Taking a magnesium supplement. This can be taken in pill form. If the condition is severe, magnesium is usually given through an IV. ?Making changes to your diet. You may be directed to eat foods that have a lot of magnesium, such as green leafy vegetables, peas, beans, and nuts. ?Not drinking alcohol. If you are struggling not to drink, ask your health care provider for help. ?Follow these instructions at home: ?Eating and drinking ?  ?Make sure that your diet includes foods with magnesium. Foods that have a lot of magnesium in them include: ?Green leafy vegetables, such as spinach and broccoli. ?Beans and peas. ?Nuts and seeds, such as almonds and sunflower seeds. ?Whole grains, such as whole grain bread and fortified cereals. ?Drink fluids that contain salts and minerals (electrolytes), such as sports drinks, when you are active. ?Do not drink alcohol. ?General instructions ?Take over-the-counter and prescription medicines only as told by your health care provider. ?Take magnesium supplements as directed  if your health care provider tells you to take them. ?Have your magnesium levels monitored as told by your health care provider. ?Keep all follow-up visits. This is important. ?Contact a health care provider if: ?You get worse instead of better. ?Your symptoms return. ?Get help right away if: ?You develop severe muscle weakness. ?You have trouble breathing. ?You feel that your heart is racing. ?These symptoms may represent a serious problem that is an emergency. Do not wait to see if the symptoms will go away. Get medical help right away. Call your local emergency services (911 in the [place] Do not drive yourself to the hospital. ?Summary ?Hypomagnesemia is a condition in which the level of magnesium in the blood is too low. ?Hypomagnesemia can affect every organ in the body. ?Treatment may include eating more foods that contain magnesium, taking magnesium supplements, and not drinking alcohol. ?Have your magnesium levels monitored as told by your health care provider. ?This information is not intended to replace advice given to you by your health care provider. Make sure you discuss any questions you have with your health care provider. ?Document Revised: 07/23/2020 Document Reviewed: 07/23/2020 ?Elsevier Patient Education ? [place]. ? ?

## 2021-05-09 ENCOUNTER — Telehealth: Payer: Self-pay | Admitting: Hematology

## 2021-05-09 NOTE — Telephone Encounter (Signed)
Left message with follow-up appointments per 3/2 los. ?

## 2021-05-29 ENCOUNTER — Other Ambulatory Visit: Payer: Self-pay

## 2021-05-29 ENCOUNTER — Inpatient Hospital Stay: Payer: Medicare (Managed Care)

## 2021-05-29 ENCOUNTER — Inpatient Hospital Stay (HOSPITAL_BASED_OUTPATIENT_CLINIC_OR_DEPARTMENT_OTHER): Payer: Medicare (Managed Care) | Admitting: Hematology

## 2021-05-29 VITALS — BP 116/74

## 2021-05-29 VITALS — BP 145/96 | HR 78 | Temp 98.7°F | Resp 18 | Ht 66.0 in | Wt 188.8 lb

## 2021-05-29 DIAGNOSIS — C221 Intrahepatic bile duct carcinoma: Secondary | ICD-10-CM

## 2021-05-29 DIAGNOSIS — C189 Malignant neoplasm of colon, unspecified: Secondary | ICD-10-CM

## 2021-05-29 DIAGNOSIS — Z7189 Other specified counseling: Secondary | ICD-10-CM

## 2021-05-29 DIAGNOSIS — Z95828 Presence of other vascular implants and grafts: Secondary | ICD-10-CM

## 2021-05-29 DIAGNOSIS — Z5112 Encounter for antineoplastic immunotherapy: Secondary | ICD-10-CM | POA: Diagnosis not present

## 2021-05-29 LAB — CMP (CANCER CENTER ONLY)
ALT: 31 U/L (ref 0–44)
AST: 32 U/L (ref 15–41)
Albumin: 4.4 g/dL (ref 3.5–5.0)
Alkaline Phosphatase: 70 U/L (ref 38–126)
Anion gap: 7 (ref 5–15)
BUN: 16 mg/dL (ref 8–23)
CO2: 29 mmol/L (ref 22–32)
Calcium: 10 mg/dL (ref 8.9–10.3)
Chloride: 100 mmol/L (ref 98–111)
Creatinine: 0.61 mg/dL (ref 0.44–1.00)
GFR, Estimated: >60 mL/min (ref >60)
Glucose, Bld: 118 mg/dL — ABNORMAL HIGH (ref 70–99)
Potassium: 4.3 mmol/L (ref 3.5–5.1)
Sodium: 136 mmol/L (ref 135–145)
Total Bilirubin: 0.5 mg/dL (ref 0.3–1.2)
Total Protein: 8 g/dL (ref 6.5–8.1)

## 2021-05-29 LAB — CBC WITH DIFFERENTIAL (CANCER CENTER ONLY)
Abs Immature Granulocytes: 0.01 10*3/uL (ref 0.00–0.07)
Basophils Absolute: 0 10*3/uL (ref 0.0–0.1)
Basophils Relative: 1 %
Eosinophils Absolute: 0.1 10*3/uL (ref 0.0–0.5)
Eosinophils Relative: 1 %
HCT: 36.6 % (ref 36.0–46.0)
Hemoglobin: 12.6 g/dL (ref 12.0–15.0)
Immature Granulocytes: 0 %
Lymphocytes Relative: 21 %
Lymphs Abs: 1.2 10*3/uL (ref 0.7–4.0)
MCH: 33.8 pg (ref 26.0–34.0)
MCHC: 34.4 g/dL (ref 30.0–36.0)
MCV: 98.1 fL (ref 80.0–100.0)
Monocytes Absolute: 0.6 10*3/uL (ref 0.1–1.0)
Monocytes Relative: 12 %
Neutro Abs: 3.6 10*3/uL (ref 1.7–7.7)
Neutrophils Relative %: 65 %
Platelet Count: 204 10*3/uL (ref 150–400)
RBC: 3.73 MIL/uL — ABNORMAL LOW (ref 3.87–5.11)
RDW: 14.7 % (ref 11.5–15.5)
WBC Count: 5.5 10*3/uL (ref 4.0–10.5)
nRBC: 0 % (ref 0.0–0.2)

## 2021-05-29 LAB — MAGNESIUM: Magnesium: 1.6 mg/dL — ABNORMAL LOW (ref 1.7–2.4)

## 2021-05-29 LAB — CEA (IN HOUSE-CHCC): CEA (CHCC-In House): <1 ng/mL (ref 0.00–5.00)

## 2021-05-29 MED ORDER — MAGNESIUM SULFATE 4 GM/100ML IV SOLN
4.0000 g | Freq: Once | INTRAVENOUS | Status: AC
  Administered 2021-05-29: 4 g via INTRAVENOUS
  Filled 2021-05-29: qty 100

## 2021-05-29 MED ORDER — SODIUM CHLORIDE 0.9% FLUSH
10.0000 mL | INTRAVENOUS | Status: DC | PRN
  Administered 2021-05-29: 10 mL

## 2021-05-29 MED ORDER — SODIUM CHLORIDE 0.9% FLUSH
10.0000 mL | Freq: Once | INTRAVENOUS | Status: AC
  Administered 2021-05-29: 10 mL

## 2021-05-29 MED ORDER — HEPARIN SOD (PORK) LOCK FLUSH 100 UNIT/ML IV SOLN
500.0000 [IU] | Freq: Once | INTRAVENOUS | Status: AC | PRN
  Administered 2021-05-29: 500 [IU]

## 2021-05-29 MED ORDER — SODIUM CHLORIDE 0.9 % IV SOLN
6.0000 mg/kg | Freq: Once | INTRAVENOUS | Status: AC
  Administered 2021-05-29: 500 mg via INTRAVENOUS
  Filled 2021-05-29: qty 20

## 2021-05-29 MED ORDER — SODIUM CHLORIDE 0.9 % IV SOLN: Freq: Once | INTRAVENOUS | Status: AC

## 2021-05-29 NOTE — Patient Instructions (Signed)
[facility]  Discharge Instructions: ?Thank you for choosing [facility] to provide your oncology and hematology care.  ? ?If you have a lab appointment with the [facility], please go directly to the [facility] and check in at the registration area. ?  ?Wear comfortable clothing and clothing appropriate for easy access to any Portacath or PICC line.  ? ?We strive to give you quality time with your provider. You may need to reschedule your appointment if you arrive late (15 or more minutes).  Arriving late affects you and other patients whose appointments are after yours.  Also, if you miss three or more appointments without notifying the office, you may be dismissed from the clinic at the provider?s discretion.    ?  ?For prescription refill requests, have your pharmacy contact our office and allow 72 hours for refills to be completed.   ? ?Today you received the following chemotherapy and/or immunotherapy agents: Vectibix.    ?  ?To help prevent nausea and vomiting after your treatment, we encourage you to take your nausea medication as directed. ? ?BELOW ARE SYMPTOMS THAT SHOULD BE REPORTED IMMEDIATELY: ?*FEVER GREATER THAN 100.4 F (38 ?C) OR HIGHER ?*CHILLS OR SWEATING ?*NAUSEA AND VOMITING THAT IS NOT CONTROLLED WITH YOUR NAUSEA MEDICATION ?*UNUSUAL SHORTNESS OF BREATH ?*UNUSUAL BRUISING OR BLEEDING ?*URINARY PROBLEMS (pain or burning when urinating, or frequent urination) ?*BOWEL PROBLEMS (unusual diarrhea, constipation, pain near the anus) ?TENDERNESS IN MOUTH AND THROAT WITH OR WITHOUT PRESENCE OF ULCERS (sore throat, sores in mouth, or a toothache) ?UNUSUAL RASH, SWELLING OR PAIN  ?UNUSUAL VAGINAL DISCHARGE OR ITCHING  ? ?Items with * indicate a potential emergency and should be followed up as soon as possible or go to the Emergency Department if any problems should occur. ? ?Please show the CHEMOTHERAPY ALERT CARD or IMMUNOTHERAPY ALERT CARD at check-in to  the Emergency Department and triage nurse. ? ?Should you have questions after your visit or need to cancel or reschedule your appointment, please contact [facility]  Dept: [phone]  and follow the prompts.  Office hours are 8:00 a.m. to 4:30 p.m. Monday - Friday. Please note that voicemails left after 4:00 p.m. may not be returned until the following business day.  We are closed weekends and major holidays. You have access to a nurse at all times for urgent questions. Please call the main number to the clinic Dept: [phone] and follow the prompts. ? ? ?For any non-urgent questions, you may also contact your provider using MyChart. We now offer e-Visits for anyone 19 and older to request care online for non-urgent symptoms. For details visit [email or web address]. ?  ?Also download the MyChart app! Go to the app store, search "MyChart", open the app, select [facility], and log in with your MyChart username and password. ? ?Due to Covid, a mask is required upon entering the hospital/clinic. If you do not have a mask, one will be given to you upon arrival. For doctor visits, patients may have 1 support person aged 75 or older with them. For treatment visits, patients cannot have anyone with them due to current Covid guidelines and our immunocompromised population.  ? ?Hypomagnesemia ?Hypomagnesemia is a condition in which the level of magnesium in the blood is too low. Magnesium is a mineral that is found in many foods. It is used in many different processes in the body. Hypomagnesemia can affect every organ in the body. In severe cases, it  can cause life-threatening problems. ?What are the causes? ?This condition may be caused by: ?Not getting enough magnesium in your diet or not having enough healthy foods to eat (malnutrition). ?Problems with magnesium absorption in the intestines. ?Dehydration. ?Excessive use of alcohol. ?Vomiting. ?Severe or long-term (chronic)  diarrhea. ?Some medicines, including medicines that make you urinate more often (diuretics). ?Certain diseases, such as kidney disease, diabetes, celiac disease, and overactive thyroid. ?What are the signs or symptoms? ?Symptoms of this condition include: ?Loss of appetite, nausea, and vomiting. ?Involuntary shaking or trembling of a body part (tremor). ?Muscle weakness or tingling in the arms and legs. ?Sudden tightening of muscles (muscle spasms). ?Confusion. ?Psychiatric issues, such as: ?Depression and irritability. ?Psychosis. ?A feeling of fluttering of the heart (palpitations). ?Seizures. ?These symptoms are more severe if magnesium levels drop suddenly. ?How is this diagnosed? ?This condition may be diagnosed based on: ?Your symptoms and medical history. ?A physical exam. ?Blood and urine tests. ?How is this treated? ?Treatment depends on the cause and the severity of the condition. It may be treated by: ?Taking a magnesium supplement. This can be taken in pill form. If the condition is severe, magnesium is usually given through an IV. ?Making changes to your diet. You may be directed to eat foods that have a lot of magnesium, such as green leafy vegetables, peas, beans, and nuts. ?Not drinking alcohol. If you are struggling not to drink, ask your health care provider for help. ?Follow these instructions at home: ?Eating and drinking ?  ?Make sure that your diet includes foods with magnesium. Foods that have a lot of magnesium in them include: ?Green leafy vegetables, such as spinach and broccoli. ?Beans and peas. ?Nuts and seeds, such as almonds and sunflower seeds. ?Whole grains, such as whole grain bread and fortified cereals. ?Drink fluids that contain salts and minerals (electrolytes), such as sports drinks, when you are active. ?Do not drink alcohol. ?General instructions ?Take over-the-counter and prescription medicines only as told by your health care provider. ?Take magnesium supplements as directed  if your health care provider tells you to take them. ?Have your magnesium levels monitored as told by your health care provider. ?Keep all follow-up visits. This is important. ?Contact a health care provider if: ?You get worse instead of better. ?Your symptoms return. ?Get help right away if: ?You develop severe muscle weakness. ?You have trouble breathing. ?You feel that your heart is racing. ?These symptoms may represent a serious problem that is an emergency. Do not wait to see if the symptoms will go away. Get medical help right away. Call your local emergency services (911 in the [place] Do not drive yourself to the hospital. ?Summary ?Hypomagnesemia is a condition in which the level of magnesium in the blood is too low. ?Hypomagnesemia can affect every organ in the body. ?Treatment may include eating more foods that contain magnesium, taking magnesium supplements, and not drinking alcohol. ?Have your magnesium levels monitored as told by your health care provider. ?This information is not intended to replace advice given to you by your health care provider. Make sure you discuss any questions you have with your health care provider. ?Document Revised: 07/23/2020 Document Reviewed: 07/23/2020 ?Elsevier Patient Education ? [place]. ? ?

## 2021-05-29 NOTE — Progress Notes (Signed)
?[place]   ?[phone] [phone]   ?Clinic Follow up Note  ? ?Patient Care Team: ?[name], MD as PCP - General (Internal Medicine) ?[name], RN (Inactive) as Oncology Nurse Navigator ?[name], MD as Consulting Physician (Oncology) ?[name], MD as Consulting Physician (Gastroenterology) ? ?Date of Service:  05/31/2021 ? ?CHIEF COMPLAINT: f/u of metastatic colon cancer ? ?CURRENT THERAPY:  ?-Xeloda started 01/25/20, currently on 1500 mg BID days 1-7 q14d ?-Vectibix, q3weeks, started 02/06/20 ? ?ASSESSMENT & PLAN:  ?Lindsay [name] is a 66 y.o. female with  ? ?1. 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 MSS, [name]/[name] wildtype ?-Her 12/15/19 MRI abdomen showed 2 large liver masses indicating metastatic disease and bulky porta hepatis and retroperitoneal adenopathy. ?-Her EUS from 12/22/19 showed 3.5cm LN in porta hepatis region and biopsy confirmed metastatic adenocarcinoma.  Preliminary cytology sample is not adequate for any additional IHC study for the origin of tumor, the morphology does not support HCC. ?-Her EGD was negative for malignancy.  Her colonoscopy in March 2021 did show a large 2 cm polyps in descending colon, biopsy showed high-grade dysplasia.   ?-Liver biopsy confirmed adenocarcinoma, immunostain showed positive CDX2 and CK20, negative CK7, supporting primary colorectal cancer ?-PET scan showed known liver metastasis, and diffuse thoracic and abdominal adenopathy. There is a hypermetabolic 1.8 cm pulmonary nodule in left upper lobe, and focal hypermetabolic lesion in the splenic fixture of the colon, concerning for primary tumor. ?-She underwent repeat colonoscopy and biopsy by Dr. Benson Norway; additional IHC testing with TTF-1 shows focal staining, which can be seen in 5-10% of colorectal primary cancers as well as lung cancer. However given the above, the overall picture is still consistent with colorectal primary.  ?-She began first  line systemic FOLFOX on 11/18, goal is palliative. Tolerating well  ?-FO shows [name]/[name] wild-type, she is a candidate for EGFR inhibitor Panitumumab which was added with C2 ?-Restaging CT's 04/01/20 and 06/20/20 showed interval reduction in size of liver, lung, and nodal metastases.  CEA normalized as of 04/16/20 ?-Oxaliplatin was stopped after cycle 18 due to reaction.  She also had vasovagal-like reaction after Vectibix, before 5-FU on 08/02/20 but no issues on 10/02/20. Due to skin cracking and concern for 5-FU infusion reactions she was switched to Xeloda.   ?-She has developed significant hand-foot syndrome and was reduced to 1500 mg BID 2 weeks on/1 week off on 01/20/21. She continues to have skin toxicity, so we switched her to 7 days on/7 days off.  ?-most recent CT CAP on 02/14/21 showed overall stable disease. We will plan to repeat CT next month. ?-she is tolerating 1500 mg BID 7 days on/7 days off well overall. She still has skin changes, as to be expected. ?-lab reviewed, overall improved and adequate for Vectibix today  ?  ?2. Chemo Toxicities: Skin and nail changes, hand-foot syndrome, hypomagnesia, acid reflux ?-secondary to 5-FU and worse on Xeloda ?-tolerating better on 1500 mg 7 days on/7 days off ?-she is currently taking magnesium 800 mg TID.  ?-she has prilosec for acid reflux symptoms.  ?  ?3. Grade 1 neuropathy  ?-S/p cycle 5, oxaliplatin dose reduced and eventually discontinued after reaction 09/18/20 ?-Persistent neuropathy at the feet and fingertips, mild ?  ?4. Chronic Lower abdominal pain, Nausea ?-She has had chronic low abdominal pain for 5 years ranging up to 4 times a week. She notes her pain worsened in the last 3 years, but no work-up until 2021.   ?-  encouraged her to use Ensure/boost if she has decreased appetite  ?-f/up with dietician ?-abdominal pain improved on chemo.  mild an intermittent. Does not require medication  ?  ?5. HTN, DM, Heart Murmur, Arthritis ?-Per PCP and Cardiologist  Dr Einar Gip ?-DM controlled, no pre-existing neuropathy ?  ?6. 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 ?-She lives with husband and has brothers and sister in and out of town. Her only child passed in recent years. ?  ?7. Goals of care ?-we discussed the incurable but treatable nature of her stage IV cancer and general prognosis. She understands the prognosis is poor if she does not tolerate or respond well to chemo ?-she understands the goal is palliative ?-full code for now  ?  ?  ?PLAN: ?-proceed with Vectibix today and continue every 3 weeks ?-continue Xeloda at 1568m q12h, 7 days on and 7 days off ?-f/u and Vectibix in 3 weeks with lab/flush and CT several days before ? ? ?No problem-specific Assessment & Plan notes found for this encounter. ? ? ?SUMMARY OF ONCOLOGIC HISTORY: ?Oncology History Overview Note  ?Cancer Staging ?No matching staging information was found for the patient. ? ?  ?metastatic colon cancer to liver  ?06/20/2019 Procedure  ? Colonoscopy by Dr MRush Landmark4/13/21  ?IMPRESSION ?-Seven 3 to 10 mm polyps in the sigmoid colon, in the transverse colon and in the escending colon, removed with a cold snare. Resected and retrireved.  ?-One 215mpolyp in the descending colon. Biopsies. Tattoes.  ?-Mediaum sized lipoma in the ascending colon.  ? ?FINAL DIAGNOSIS:  ?A.Colon, Descending, Polyp, Polectomy:  ?-FRAGMENTS OF TUBULAR ADENOMA WITH DIFFUCE HIGH GRADE DYSPLASIA. See Comment ?B. Colon, Ascending, polyp, Polypectomy:  ?-TUBULAR ADENOMA ?-No high grade dysplasia or malignancy.  ?C. Colon, TRansverse, Polyo, polectomy:  ?-TUBULAR ADENOMA ?-No high grade dysplasia or malignancy.  ?D. Colon, Sigmoid, Polyp, Polypectomy:  ?-HYPERPLASTIC POLYP ?  ?11/07/2019 Imaging  ? USKoreabdomen 11/07/19  ?IMPRESSION: ?1. Two solid masses in the liver are nonspecific. Recommend MRI ?abdomen with and without contrast for further evaluation. ?  ?12/15/2019 Imaging  ? MRI Abdomen 12/15/19   ?IMPRESSION: ?1. There are two large masses in the liver with appearance favoring ?metastatic disease or hepatocellular carcinoma or ?cholangiocarcinoma. A benign etiology is highly unlikely given the ?enhancement pattern and associated adenopathy. ?2. Considerable porta hepatis and retroperitoneal adenopathy. Some ?of the confluent porta hepatis tumor is potentially infiltrative and ?abuts the pancreatic body along its right upper margin, making it ?difficult to completely exclude the possibility of pancreatic ?adenocarcinoma primary. Possibilities helpful in further workup ?might include tissue diagnosis, endoscopic ultrasound, or nuclear ?medicine PET-CT. ?3. Pancreas divisum. ?4. Lumbar spondylosis and degenerative disc disease. ?5. Despite efforts by the technologist and patient, motion artifact ?is present on today's exam and could not be eliminated. This reduces ?exam sensitivity and specificity. ?  ?12/22/2019 Procedure  ? Upper Endoscopy by Dr HuBenson Norway0/15/21  ?IMPRESSION ?- One lymph node was visualized and measured in the porta hepatis region. Fine needle ?aspiration performed  ?  ?12/22/2019 Initial Biopsy  ? A. LIVER, PORTA HEPATIS MASS, FINE NEEDLE 12/22/19 ?ASPIRATION:  ?FINAL MICROSCOPIC DIAGNOSIS:  ?- Malignant cells consistent with metastatic adenocarcinoma ?  ?01/01/2020 Initial Diagnosis  ? Intrahepatic cholangiocarcinoma ([facility]?  ?01/08/2020 Initial Biopsy  ? FINAL MICROSCOPIC DIAGNOSIS:  ? ?A. LIVER, LEFT LOBE, BIOPSY:  ?- Metastatic adenocarcinoma, consistent with a colorectal primary.  See  ?comment  ? ? ? ? ?COMMENT:  ? ?  Immunohistochemical stains show the tumor cells are positive for CK20  ?and CDX2 but negative for CK7, consistent with above interpretation.  ?Dr. Burr Medico was notified on 01/10/2020 ?  ?01/08/2020 Genetic Testing  ? [name]  ?[name] wildtype and [name]/[name] mutations which make her eligible for target biological agent Vectibix. ?  ?01/24/2020 Procedure  ? PAC placed 01/24/20 ?   ?01/25/2020 -  Chemotherapy  ? first line FOLFOX starting 01/25/2020, Vextibix  added with C2 (02/06/20) ?  ?06/20/2020 Imaging  ? CT CAP  ?IMPRESSION: ?1. Continued interval reduction in size and conspicui

## 2021-05-31 ENCOUNTER — Encounter: Payer: Self-pay | Admitting: Hematology

## 2021-06-16 ENCOUNTER — Other Ambulatory Visit: Payer: Self-pay

## 2021-06-16 ENCOUNTER — Inpatient Hospital Stay: Payer: Medicare (Managed Care) | Attending: Hematology

## 2021-06-16 DIAGNOSIS — G620 Drug-induced polyneuropathy: Secondary | ICD-10-CM | POA: Insufficient documentation

## 2021-06-16 DIAGNOSIS — C787 Secondary malignant neoplasm of liver and intrahepatic bile duct: Secondary | ICD-10-CM | POA: Diagnosis present

## 2021-06-16 DIAGNOSIS — Z7984 Long term (current) use of oral hypoglycemic drugs: Secondary | ICD-10-CM | POA: Insufficient documentation

## 2021-06-16 DIAGNOSIS — R011 Cardiac murmur, unspecified: Secondary | ICD-10-CM | POA: Insufficient documentation

## 2021-06-16 DIAGNOSIS — E1140 Type 2 diabetes mellitus with diabetic neuropathy, unspecified: Secondary | ICD-10-CM | POA: Diagnosis not present

## 2021-06-16 DIAGNOSIS — C778 Secondary and unspecified malignant neoplasm of lymph nodes of multiple regions: Secondary | ICD-10-CM | POA: Insufficient documentation

## 2021-06-16 DIAGNOSIS — Z86010 Personal history of colonic polyps: Secondary | ICD-10-CM | POA: Insufficient documentation

## 2021-06-16 DIAGNOSIS — Z95828 Presence of other vascular implants and grafts: Secondary | ICD-10-CM

## 2021-06-16 DIAGNOSIS — Z5112 Encounter for antineoplastic immunotherapy: Secondary | ICD-10-CM | POA: Insufficient documentation

## 2021-06-16 DIAGNOSIS — T451X5D Adverse effect of antineoplastic and immunosuppressive drugs, subsequent encounter: Secondary | ICD-10-CM | POA: Insufficient documentation

## 2021-06-16 DIAGNOSIS — Z79899 Other long term (current) drug therapy: Secondary | ICD-10-CM | POA: Insufficient documentation

## 2021-06-16 DIAGNOSIS — C7802 Secondary malignant neoplasm of left lung: Secondary | ICD-10-CM | POA: Insufficient documentation

## 2021-06-16 DIAGNOSIS — I10 Essential (primary) hypertension: Secondary | ICD-10-CM | POA: Diagnosis not present

## 2021-06-16 DIAGNOSIS — C19 Malignant neoplasm of rectosigmoid junction: Secondary | ICD-10-CM | POA: Diagnosis present

## 2021-06-16 DIAGNOSIS — C189 Malignant neoplasm of colon, unspecified: Secondary | ICD-10-CM

## 2021-06-16 DIAGNOSIS — Z87891 Personal history of nicotine dependence: Secondary | ICD-10-CM | POA: Diagnosis not present

## 2021-06-16 DIAGNOSIS — C221 Intrahepatic bile duct carcinoma: Secondary | ICD-10-CM

## 2021-06-16 LAB — CMP (CANCER CENTER ONLY)
ALT: 31 U/L (ref 0–44)
AST: 29 U/L (ref 15–41)
Albumin: 4.3 g/dL (ref 3.5–5.0)
Alkaline Phosphatase: 78 U/L (ref 38–126)
Anion gap: 9 (ref 5–15)
BUN: 13 mg/dL (ref 8–23)
CO2: 29 mmol/L (ref 22–32)
Calcium: 9.9 mg/dL (ref 8.9–10.3)
Chloride: 98 mmol/L (ref 98–111)
Creatinine: 0.61 mg/dL (ref 0.44–1.00)
GFR, Estimated: >60 mL/min (ref >60)
Glucose, Bld: 119 mg/dL — ABNORMAL HIGH (ref 70–99)
Potassium: 3.9 mmol/L (ref 3.5–5.1)
Sodium: 136 mmol/L (ref 135–145)
Total Bilirubin: 0.5 mg/dL (ref 0.3–1.2)
Total Protein: 8.2 g/dL — ABNORMAL HIGH (ref 6.5–8.1)

## 2021-06-16 LAB — CEA (IN HOUSE-CHCC): CEA (CHCC-In House): 1.21 ng/mL (ref 0.00–5.00)

## 2021-06-16 LAB — CBC WITH DIFFERENTIAL (CANCER CENTER ONLY)
Abs Immature Granulocytes: 0.01 10*3/uL (ref 0.00–0.07)
Basophils Absolute: 0 10*3/uL (ref 0.0–0.1)
Basophils Relative: 1 %
Eosinophils Absolute: 0.1 10*3/uL (ref 0.0–0.5)
Eosinophils Relative: 2 %
HCT: 36.8 % (ref 36.0–46.0)
Hemoglobin: 12.7 g/dL (ref 12.0–15.0)
Immature Granulocytes: 0 %
Lymphocytes Relative: 19 %
Lymphs Abs: 1.2 10*3/uL (ref 0.7–4.0)
MCH: 33.6 pg (ref 26.0–34.0)
MCHC: 34.5 g/dL (ref 30.0–36.0)
MCV: 97.4 fL (ref 80.0–100.0)
Monocytes Absolute: 0.6 10*3/uL (ref 0.1–1.0)
Monocytes Relative: 10 %
Neutro Abs: 4.3 10*3/uL (ref 1.7–7.7)
Neutrophils Relative %: 68 %
Platelet Count: 235 10*3/uL (ref 150–400)
RBC: 3.78 MIL/uL — ABNORMAL LOW (ref 3.87–5.11)
RDW: 13.6 % (ref 11.5–15.5)
WBC Count: 6.3 10*3/uL (ref 4.0–10.5)
nRBC: 0 % (ref 0.0–0.2)

## 2021-06-16 LAB — MAGNESIUM: Magnesium: 1.5 mg/dL — ABNORMAL LOW (ref 1.7–2.4)

## 2021-06-16 MED ORDER — SODIUM CHLORIDE 0.9% FLUSH
10.0000 mL | Freq: Once | INTRAVENOUS | Status: AC
  Administered 2021-06-16: 10 mL

## 2021-06-16 MED ORDER — HEPARIN SOD (PORK) LOCK FLUSH 100 UNIT/ML IV SOLN
500.0000 [IU] | Freq: Once | INTRAVENOUS | Status: AC
  Administered 2021-06-16: 500 [IU]

## 2021-06-19 ENCOUNTER — Inpatient Hospital Stay: Payer: Medicare (Managed Care)

## 2021-06-19 ENCOUNTER — Encounter: Payer: Self-pay | Admitting: Hematology

## 2021-06-19 ENCOUNTER — Other Ambulatory Visit: Payer: Self-pay

## 2021-06-19 ENCOUNTER — Inpatient Hospital Stay (HOSPITAL_BASED_OUTPATIENT_CLINIC_OR_DEPARTMENT_OTHER): Payer: Medicare (Managed Care) | Admitting: Hematology

## 2021-06-19 VITALS — BP 155/86 | HR 69 | Temp 98.5°F | Resp 18 | Ht 66.0 in | Wt 188.5 lb

## 2021-06-19 DIAGNOSIS — C189 Malignant neoplasm of colon, unspecified: Secondary | ICD-10-CM | POA: Diagnosis not present

## 2021-06-19 DIAGNOSIS — C221 Intrahepatic bile duct carcinoma: Secondary | ICD-10-CM

## 2021-06-19 DIAGNOSIS — Z5112 Encounter for antineoplastic immunotherapy: Secondary | ICD-10-CM | POA: Diagnosis not present

## 2021-06-19 DIAGNOSIS — Z7189 Other specified counseling: Secondary | ICD-10-CM

## 2021-06-19 MED ORDER — SODIUM CHLORIDE 0.9% FLUSH
10.0000 mL | INTRAVENOUS | Status: DC | PRN
  Administered 2021-06-19: 10 mL

## 2021-06-19 MED ORDER — SODIUM CHLORIDE 0.9 % IV SOLN
6.0000 mg/kg | Freq: Once | INTRAVENOUS | Status: AC
  Administered 2021-06-19: 500 mg via INTRAVENOUS
  Filled 2021-06-19: qty 20

## 2021-06-19 MED ORDER — SODIUM CHLORIDE 0.9 % IV SOLN: Freq: Once | INTRAVENOUS | Status: AC

## 2021-06-19 MED ORDER — CAPECITABINE 500 MG PO TABS: 1500.0000 mg | ORAL_TABLET | Freq: Two times a day (BID) | ORAL | 1 refills | Status: DC

## 2021-06-19 MED ORDER — HEPARIN SOD (PORK) LOCK FLUSH 100 UNIT/ML IV SOLN
500.0000 [IU] | Freq: Once | INTRAVENOUS | Status: AC | PRN
  Administered 2021-06-19: 500 [IU]

## 2021-06-19 MED ORDER — MAGNESIUM SULFATE 4 GM/100ML IV SOLN
4.0000 g | Freq: Once | INTRAVENOUS | Status: AC
  Administered 2021-06-19: 4 g via INTRAVENOUS
  Filled 2021-06-19: qty 100

## 2021-06-19 NOTE — Progress Notes (Signed)
?[name]   ?Telephone:(336) [phone] [phone]   ?Clinic Follow up Note  ? ?Patient Care Team: ?[name], MD as PCP - General (Internal Medicine) ?[name], RN (Inactive) as Oncology Nurse Navigator ?[name], MD as Consulting Physician (Oncology) ?[name], MD as Consulting Physician (Gastroenterology) ? ?Date of Service:  06/19/2021 ? ?CHIEF COMPLAINT: f/u of metastatic colon cancer ? ?CURRENT THERAPY:  ?-Xeloda started 01/25/20, currently on 1500 mg BID days 1-7 q14d ?-Vectibix, q3weeks, started 02/06/20 ? ?ASSESSMENT & PLAN:  ?Lindsay [name] is a 66 y.o. female with  ? ?1. 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 MSS, [name]/[name] wildtype ?-Her 12/15/19 MRI abdomen showed 2 large liver masses indicating metastatic disease and bulky porta hepatis and retroperitoneal adenopathy. ?-Her EUS from 12/22/19 showed 3.5cm LN in porta hepatis region and biopsy confirmed metastatic adenocarcinoma.  Preliminary cytology sample is not adequate for any additional IHC study for the origin of tumor, the morphology does not support HCC. ?-Her EGD was negative for malignancy.  Her colonoscopy in March 2021 did show a large 2 cm polyps in descending colon, biopsy showed high-grade dysplasia.   ?-Liver biopsy confirmed adenocarcinoma, immunostain showed positive CDX2 and CK20, negative CK7, supporting primary colorectal cancer ?-PET scan showed known liver metastasis, and diffuse thoracic and abdominal adenopathy. There is a hypermetabolic 1.8 cm pulmonary nodule in left upper lobe, and focal hypermetabolic lesion in the splenic fixture of the colon, concerning for primary tumor. ?-She underwent repeat colonoscopy and biopsy by Dr. Benson Norway; additional IHC testing with TTF-1 shows focal staining, which can be seen in 5-10% of colorectal primary cancers as well as lung cancer. However given the above, the overall picture is still consistent with colorectal primary.  ?-She began first  line systemic FOLFOX on 11/18, goal is palliative. Tolerating well  ?-FO shows [name]/[name] wild-type, she is a candidate for EGFR inhibitor Panitumumab which was added with C2 ?-Restaging CT's 04/01/20 and 06/20/20 showed interval reduction in size of liver, lung, and nodal metastases.  CEA normalized as of 04/16/20 ?-Oxaliplatin was stopped after cycle 18 due to reaction.  She also had vasovagal-like reaction after Vectibix, before 5-FU on 08/02/20 but no issues on 10/02/20. Due to skin cracking and concern for 5-FU infusion reactions she was switched to Xeloda.   ?-She has developed significant hand-foot syndrome and was reduced to 1500 mg BID 2 weeks on/1 week off on 01/20/21. She continues to have skin toxicity, so we switched her to 7 days on/7 days off.  ?-most recent CT CAP on 02/14/21 showed overall stable disease. Restaging CT scheduled for tomorrow, 06/20/21. ?-she is tolerating Xeloda 1500 mg BID 7 days on/7 days off well overall. She has stable skin changes, as to be expected. ?-lab from 06/16/21 reviewed, overall improved and adequate for Vectibix today  ?  ?2. Chemo Toxicities: Skin and nail changes, hand-foot syndrome, hypomagnesia, acid reflux ?-secondary to 5-FU and worse on Xeloda ?-tolerating better on 1500 mg 7 days on/7 days off ?-she is currently taking magnesium 800 mg TID. Her mag remains low, so I advised her to increase to QID. ?-she has prilosec for acid reflux symptoms. She reports continued symptoms despite the prilosec; I advised her to increase the dose. ?  ?3. Grade 1 neuropathy  ?-S/p cycle 5, oxaliplatin dose reduced and eventually discontinued after reaction 09/18/20 ?-Persistent neuropathy at the feet and fingertips, mild ?  ?4. Chronic Lower abdominal pain, Nausea ?-She has had chronic low abdominal pain  for 5 years ranging up to 4 times a week. She notes her pain worsened in the last 3 years, but no work-up until 2021.   ?-abdominal pain improved on chemo.  mild and intermittent. Does not  require medication  ?-encouraged her to use Ensure/boost if she has decreased appetite  ?-f/up with dietician ?  ?5. HTN, DM, Heart Murmur, Arthritis ?-Per PCP and Cardiologist Dr Einar Gip ?-DM controlled, no pre-existing neuropathy ?  ?6. 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 ?-She lives with husband and has brothers and sister in and out of town. Her only child passed in recent years. ?  ?7. Goals of care ?-we discussed the incurable but treatable nature of her stage IV cancer and general prognosis. She understands the prognosis is poor if she does not tolerate or respond well to chemo ?-she understands the goal is palliative ?-full code for now  ?  ?  ?PLAN: ?-proceed with Vectibix today and continue every 3 weeks ?-continue Xeloda at 1557m q12h, 7 days on and 7 days off ?-increase magnesium dose to 2 tabs x4 a day and prilosec to 2 capsules a day ?-restaging CT CAP tomorrow, 4/14 ? -I will call her with the results  ?-lab, f/u, and Vectibix every 3 weeks ? ? ?No problem-specific Assessment & Plan notes found for this encounter. ? ? ?SUMMARY OF ONCOLOGIC HISTORY: ?Oncology History Overview Note  ?Cancer Staging ?No matching staging information was found for the patient. ? ?  ?metastatic colon cancer to liver  ?06/20/2019 Procedure  ? Colonoscopy by Dr MRush Landmark4/13/21  ?IMPRESSION ?-Seven 3 to 10 mm polyps in the sigmoid colon, in the transverse colon and in the escending colon, removed with a cold snare. Resected and retrireved.  ?-One 283mpolyp in the descending colon. Biopsies. Tattoes.  ?-Mediaum sized lipoma in the ascending colon.  ? ?FINAL DIAGNOSIS:  ?A.Colon, Descending, Polyp, Polectomy:  ?-FRAGMENTS OF TUBULAR ADENOMA WITH DIFFUCE HIGH GRADE DYSPLASIA. See Comment ?B. Colon, Ascending, polyp, Polypectomy:  ?-TUBULAR ADENOMA ?-No high grade dysplasia or malignancy.  ?C. Colon, TRansverse, Polyo, polectomy:  ?-TUBULAR ADENOMA ?-No high grade dysplasia or malignancy.  ?D.  Colon, Sigmoid, Polyp, Polypectomy:  ?-HYPERPLASTIC POLYP ?  ?11/07/2019 Imaging  ? USKoreabdomen 11/07/19  ?IMPRESSION: ?1. Two solid masses in the liver are nonspecific. Recommend MRI ?abdomen with and without contrast for further evaluation. ?  ?12/15/2019 Imaging  ? MRI Abdomen 12/15/19  ?IMPRESSION: ?1. There are two large masses in the liver with appearance favoring ?metastatic disease or hepatocellular carcinoma or ?cholangiocarcinoma. A benign etiology is highly unlikely given the ?enhancement pattern and associated adenopathy. ?2. Considerable porta hepatis and retroperitoneal adenopathy. Some ?of the confluent porta hepatis tumor is potentially infiltrative and ?abuts the pancreatic body along its right upper margin, making it ?difficult to completely exclude the possibility of pancreatic ?adenocarcinoma primary. Possibilities helpful in further workup ?might include tissue diagnosis, endoscopic ultrasound, or nuclear ?medicine PET-CT. ?3. Pancreas divisum. ?4. Lumbar spondylosis and degenerative disc disease. ?5. Despite efforts by the technologist and patient, motion artifact ?is present on today's exam and could not be eliminated. This reduces ?exam sensitivity and specificity. ?  ?12/22/2019 Procedure  ? Upper Endoscopy by Dr HuBenson Norway0/15/21  ?IMPRESSION ?- One lymph node was visualized and measured in the porta hepatis region. Fine needle ?aspiration performed  ?  ?12/22/2019 Initial Biopsy  ? A. LIVER, PORTA HEPATIS MASS, FINE NEEDLE 12/22/19 ?ASPIRATION:  ?FINAL MICROSCOPIC DIAGNOSIS:  ?- Malignant cells consistent  with metastatic adenocarcinoma ?  ?01/01/2020 Initial Diagnosis  ? Intrahepatic cholangiocarcinoma ([place]) ?  ?01/08/2020 Initial Biopsy  ? FINAL MICROSCOPIC DIAGNOSIS:  ? ?A. LIVER, LEFT LOBE, BIOPSY:  ?- Metastatic adenocarcinoma, consistent with a colorectal primary.  See  ?comment  ? ? ? ? ?COMMENT:  ? ?Immunohistochemical stains show the tumor cells are positive for CK20  ?and CDX2 but negative  for CK7, consistent with above interpretation.  ?Dr. Burr Medico was notified on 01/10/2020 ?  ?01/08/2020 Genetic Testing  ? [name]  ?[name] wildtype and [name]/[name] mutations which make her eligible for

## 2021-06-19 NOTE — Patient Instructions (Signed)
[facility]  Discharge Instructions: ?Thank you for choosing [facility] to provide your oncology and hematology care.  ? ?If you have a lab appointment with the [facility], please go directly to the [facility] and check in at the registration area. ?  ?Wear comfortable clothing and clothing appropriate for easy access to any Portacath or PICC line.  ? ?We strive to give you quality time with your provider. You may need to reschedule your appointment if you arrive late (15 or more minutes).  Arriving late affects you and other patients whose appointments are after yours.  Also, if you miss three or more appointments without notifying the office, you may be dismissed from the clinic at the provider?s discretion.    ?  ?For prescription refill requests, have your pharmacy contact our office and allow 72 hours for refills to be completed.   ? ?Today you received the following chemotherapy and/or immunotherapy agent: Vectibix, magnesium    ?  ?To help prevent nausea and vomiting after your treatment, we encourage you to take your nausea medication as directed. ? ?BELOW ARE SYMPTOMS THAT SHOULD BE REPORTED IMMEDIATELY: ?*FEVER GREATER THAN 100.4 F (38 ?C) OR HIGHER ?*CHILLS OR SWEATING ?*NAUSEA AND VOMITING THAT IS NOT CONTROLLED WITH YOUR NAUSEA MEDICATION ?*UNUSUAL SHORTNESS OF BREATH ?*UNUSUAL BRUISING OR BLEEDING ?*URINARY PROBLEMS (pain or burning when urinating, or frequent urination) ?*BOWEL PROBLEMS (unusual diarrhea, constipation, pain near the anus) ?TENDERNESS IN MOUTH AND THROAT WITH OR WITHOUT PRESENCE OF ULCERS (sore throat, sores in mouth, or a toothache) ?UNUSUAL RASH, SWELLING OR PAIN  ?UNUSUAL VAGINAL DISCHARGE OR ITCHING  ? ?Items with * indicate a potential emergency and should be followed up as soon as possible or go to the Emergency Department if any problems should occur. ? ?Please show the CHEMOTHERAPY ALERT CARD or IMMUNOTHERAPY ALERT CARD at  check-in to the Emergency Department and triage nurse. ? ?Should you have questions after your visit or need to cancel or reschedule your appointment, please contact [facility]  Dept: [phone]  and follow the prompts.  Office hours are 8:00 a.m. to 4:30 p.m. Monday - Friday. Please note that voicemails left after 4:00 p.m. may not be returned until the following business day.  We are closed weekends and major holidays. You have access to a nurse at all times for urgent questions. Please call the main number to the clinic Dept: [phone] and follow the prompts. ? ? ?For any non-urgent questions, you may also contact your provider using MyChart. We now offer e-Visits for anyone 67 and older to request care online for non-urgent symptoms. For details visit [email or web address]. ?  ?Also download the MyChart app! Go to the app store, search "MyChart", open the app, select [facility], and log in with your MyChart username and password. ? ?Due to Covid, a mask is required upon entering the hospital/clinic. If you do not have a mask, one will be given to you upon arrival. For doctor visits, patients may have 1 support person aged 64 or older with them. For treatment visits, patients cannot have anyone with them due to current Covid guidelines and our immunocompromised population.  ? ?

## 2021-06-20 ENCOUNTER — Telehealth: Payer: Self-pay | Admitting: Hematology

## 2021-06-20 ENCOUNTER — Ambulatory Visit (HOSPITAL_COMMUNITY)
Admission: RE | Admit: 2021-06-20 | Discharge: 2021-06-20 | Disposition: A | Discharge status: Home / Self Care | Payer: Medicare (Managed Care) | Source: Ambulatory Visit | Attending: Hematology | Admitting: Hematology

## 2021-06-20 DIAGNOSIS — C221 Intrahepatic bile duct carcinoma: Secondary | ICD-10-CM | POA: Insufficient documentation

## 2021-06-20 MED ORDER — IOHEXOL 300 MG/ML  SOLN
100.0000 mL | Freq: Once | INTRAMUSCULAR | Status: AC | PRN
  Administered 2021-06-20: 100 mL via INTRAVENOUS

## 2021-06-20 MED ORDER — HEPARIN SOD (PORK) LOCK FLUSH 100 UNIT/ML IV SOLN: 500.0000 [IU] | Freq: Once | INTRAVENOUS | Status: DC

## 2021-06-20 MED ORDER — HEPARIN SOD (PORK) LOCK FLUSH 100 UNIT/ML IV SOLN
INTRAVENOUS | Status: AC
  Filled 2021-06-20: qty 5

## 2021-06-20 MED ORDER — HEPARIN SOD (PORK) LOCK FLUSH 100 UNIT/ML IV SOLN
500.0000 [IU] | Freq: Once | INTRAVENOUS | Status: AC
  Administered 2021-06-20: 500 [IU] via INTRAVENOUS

## 2021-06-20 NOTE — Telephone Encounter (Signed)
Scheduled follow-up appointments per 4/13 los. Patient is aware. ?

## 2021-06-23 ENCOUNTER — Telehealth: Payer: Self-pay

## 2021-06-23 NOTE — Telephone Encounter (Signed)
Pt was told per Dr. [name], her CT Scan showed stable disease, which is a good thing and to just continue current therapy. Pt verbalized understanding. She knows to call back the office with any further questions or concerns.  ?

## 2021-06-23 NOTE — Telephone Encounter (Signed)
-----   Message from [name], MD sent at 06/21/2021  1:56 PM EDT ----- ?Please let pt know her CT scan showed stable disease, which is good. Continue current therapy. Thanks  ? ?[name]  ?06/21/2021  ?

## 2021-06-27 ENCOUNTER — Telehealth: Payer: Self-pay

## 2021-06-27 NOTE — Telephone Encounter (Signed)
Called pt back regarding her heartburn. This LPN asked if she has been taking her prilosec daily and has tried using tums. Pt states yes she has and it helps at times but she does not feel like it is enough right now. This LPN suggested the pt take Pepcid or Zantac to help relieve the heartburn. Pt verbalized understanding and knows to call the office back with anymore concerns or questions.  ?

## 2021-07-11 ENCOUNTER — Inpatient Hospital Stay: Payer: Medicare (Managed Care)

## 2021-07-11 ENCOUNTER — Inpatient Hospital Stay: Payer: Medicare (Managed Care) | Attending: Hematology

## 2021-07-11 ENCOUNTER — Inpatient Hospital Stay (HOSPITAL_BASED_OUTPATIENT_CLINIC_OR_DEPARTMENT_OTHER): Payer: Medicare (Managed Care) | Admitting: Hematology

## 2021-07-11 ENCOUNTER — Other Ambulatory Visit: Payer: Self-pay

## 2021-07-11 VITALS — BP 127/73 | HR 73 | Temp 98.0°F | Resp 18 | Wt 184.8 lb

## 2021-07-11 DIAGNOSIS — C19 Malignant neoplasm of rectosigmoid junction: Secondary | ICD-10-CM | POA: Insufficient documentation

## 2021-07-11 DIAGNOSIS — I10 Essential (primary) hypertension: Secondary | ICD-10-CM | POA: Diagnosis not present

## 2021-07-11 DIAGNOSIS — Z86010 Personal history of colonic polyps: Secondary | ICD-10-CM | POA: Diagnosis not present

## 2021-07-11 DIAGNOSIS — Z5112 Encounter for antineoplastic immunotherapy: Secondary | ICD-10-CM | POA: Diagnosis present

## 2021-07-11 DIAGNOSIS — C221 Intrahepatic bile duct carcinoma: Secondary | ICD-10-CM

## 2021-07-11 DIAGNOSIS — Z87891 Personal history of nicotine dependence: Secondary | ICD-10-CM | POA: Diagnosis not present

## 2021-07-11 DIAGNOSIS — Z7984 Long term (current) use of oral hypoglycemic drugs: Secondary | ICD-10-CM | POA: Diagnosis not present

## 2021-07-11 DIAGNOSIS — C7802 Secondary malignant neoplasm of left lung: Secondary | ICD-10-CM | POA: Insufficient documentation

## 2021-07-11 DIAGNOSIS — Z95828 Presence of other vascular implants and grafts: Secondary | ICD-10-CM

## 2021-07-11 DIAGNOSIS — C778 Secondary and unspecified malignant neoplasm of lymph nodes of multiple regions: Secondary | ICD-10-CM | POA: Insufficient documentation

## 2021-07-11 DIAGNOSIS — T451X5D Adverse effect of antineoplastic and immunosuppressive drugs, subsequent encounter: Secondary | ICD-10-CM | POA: Diagnosis not present

## 2021-07-11 DIAGNOSIS — C787 Secondary malignant neoplasm of liver and intrahepatic bile duct: Secondary | ICD-10-CM | POA: Diagnosis present

## 2021-07-11 DIAGNOSIS — R011 Cardiac murmur, unspecified: Secondary | ICD-10-CM | POA: Diagnosis not present

## 2021-07-11 DIAGNOSIS — G620 Drug-induced polyneuropathy: Secondary | ICD-10-CM | POA: Diagnosis not present

## 2021-07-11 DIAGNOSIS — Z7189 Other specified counseling: Secondary | ICD-10-CM

## 2021-07-11 DIAGNOSIS — C189 Malignant neoplasm of colon, unspecified: Secondary | ICD-10-CM

## 2021-07-11 DIAGNOSIS — E1140 Type 2 diabetes mellitus with diabetic neuropathy, unspecified: Secondary | ICD-10-CM | POA: Insufficient documentation

## 2021-07-11 LAB — CBC WITH DIFFERENTIAL (CANCER CENTER ONLY)
Abs Immature Granulocytes: 0.01 10*3/uL (ref 0.00–0.07)
Basophils Absolute: 0 10*3/uL (ref 0.0–0.1)
Basophils Relative: 1 %
Eosinophils Absolute: 0.1 10*3/uL (ref 0.0–0.5)
Eosinophils Relative: 1 %
HCT: 36.7 % (ref 36.0–46.0)
Hemoglobin: 12.6 g/dL (ref 12.0–15.0)
Immature Granulocytes: 0 %
Lymphocytes Relative: 21 %
Lymphs Abs: 1.1 10*3/uL (ref 0.7–4.0)
MCH: 33.4 pg (ref 26.0–34.0)
MCHC: 34.3 g/dL (ref 30.0–36.0)
MCV: 97.3 fL (ref 80.0–100.0)
Monocytes Absolute: 0.6 10*3/uL (ref 0.1–1.0)
Monocytes Relative: 11 %
Neutro Abs: 3.5 10*3/uL (ref 1.7–7.7)
Neutrophils Relative %: 66 %
Platelet Count: 252 10*3/uL (ref 150–400)
RBC: 3.77 MIL/uL — ABNORMAL LOW (ref 3.87–5.11)
RDW: 14.1 % (ref 11.5–15.5)
WBC Count: 5.3 10*3/uL (ref 4.0–10.5)
nRBC: 0 % (ref 0.0–0.2)

## 2021-07-11 LAB — CMP (CANCER CENTER ONLY)
ALT: 32 U/L (ref 0–44)
AST: 27 U/L (ref 15–41)
Albumin: 4.4 g/dL (ref 3.5–5.0)
Alkaline Phosphatase: 67 U/L (ref 38–126)
Anion gap: 7 (ref 5–15)
BUN: 11 mg/dL (ref 8–23)
CO2: 27 mmol/L (ref 22–32)
Calcium: 9.7 mg/dL (ref 8.9–10.3)
Chloride: 101 mmol/L (ref 98–111)
Creatinine: 0.7 mg/dL (ref 0.44–1.00)
GFR, Estimated: >60 mL/min (ref >60)
Glucose, Bld: 128 mg/dL — ABNORMAL HIGH (ref 70–99)
Potassium: 3.8 mmol/L (ref 3.5–5.1)
Sodium: 135 mmol/L (ref 135–145)
Total Bilirubin: 0.4 mg/dL (ref 0.3–1.2)
Total Protein: 8.3 g/dL — ABNORMAL HIGH (ref 6.5–8.1)

## 2021-07-11 LAB — CEA (IN HOUSE-CHCC): CEA (CHCC-In House): <1 ng/mL (ref 0.00–5.00)

## 2021-07-11 LAB — MAGNESIUM: Magnesium: 1.4 mg/dL — ABNORMAL LOW (ref 1.7–2.4)

## 2021-07-11 MED ORDER — SODIUM CHLORIDE 0.9% FLUSH
10.0000 mL | INTRAVENOUS | Status: DC | PRN
  Administered 2021-07-11: 10 mL

## 2021-07-11 MED ORDER — SODIUM CHLORIDE 0.9 % IV SOLN: Freq: Once | INTRAVENOUS | Status: AC

## 2021-07-11 MED ORDER — SODIUM CHLORIDE 0.9 % IV SOLN
6.0000 mg/kg | Freq: Once | INTRAVENOUS | Status: AC
  Administered 2021-07-11: 500 mg via INTRAVENOUS
  Filled 2021-07-11: qty 20

## 2021-07-11 MED ORDER — HEPARIN SOD (PORK) LOCK FLUSH 100 UNIT/ML IV SOLN
500.0000 [IU] | Freq: Once | INTRAVENOUS | Status: AC | PRN
  Administered 2021-07-11: 500 [IU]

## 2021-07-11 MED ORDER — CAPECITABINE 500 MG PO TABS: 1500.0000 mg | ORAL_TABLET | Freq: Two times a day (BID) | ORAL | 1 refills | Status: DC

## 2021-07-11 MED ORDER — MAGNESIUM OXIDE 400 MG PO TABS: 2.0000 | ORAL_TABLET | Freq: Three times a day (TID) | ORAL | 2 refills | Status: DC

## 2021-07-11 MED ORDER — SODIUM CHLORIDE 0.9% FLUSH
10.0000 mL | Freq: Once | INTRAVENOUS | Status: AC
  Administered 2021-07-11: 10 mL

## 2021-07-11 MED ORDER — MAGNESIUM SULFATE 4 GM/100ML IV SOLN
4.0000 g | Freq: Once | INTRAVENOUS | Status: AC
  Administered 2021-07-11: 4 g via INTRAVENOUS
  Filled 2021-07-11: qty 100

## 2021-07-11 NOTE — Progress Notes (Signed)
?[name]   ?[phone] [phone]   ?Clinic Follow up Note  ? ?Patient Care Team: ?[name], MD as PCP - General (Internal Medicine) ?[name], RN (Inactive) as Oncology Nurse Navigator ?[name], MD as Consulting Physician (Oncology) ?[name], MD as Consulting Physician (Gastroenterology) ? ?Date of Service:  07/11/2021 ? ?CHIEF COMPLAINT: f/u of metastatic colon cancer ? ?CURRENT THERAPY:  ?-Xeloda started 01/25/20, currently on 1500 mg BID days 1-7 q14d ?-Vectibix, q3weeks, started 02/06/20 ? ?ASSESSMENT & PLAN:  ?Lindsay [name] is a 66 y.o. female with  ? ?1. 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 MSS, [name]/[name] wildtype ?-Her 12/15/19 MRI abdomen showed 2 large liver masses and bulky porta hepatis and retroperitoneal adenopathy. ?-Her EUS from 12/22/19 showed 3.5cm LN in porta hepatis region and biopsy confirmed metastatic adenocarcinoma, insufficient tissue for IHC study but morphology does not support HCC. ?-Liver biopsy 01/08/20 confirmed adenocarcinoma, immunostain supporting primary colorectal cancer ?-PET scan showed: known liver metastasis; diffuse thoracic and abdominal adenopathy; hypermetabolic 1.8 cm pulmonary nodule in LUL; focal hypermetabolic lesion in the splenic fixture of the colon. ?-She began first line systemic FOLFOX on 01/25/20, goal is palliative. Scans show positive response to treatment. ?-FO shows [name]/[name] wild-type, she is a candidate for EGFR inhibitor Panitumumab which was added with C2 ?-Oxaliplatin was stopped after cycle 18 due to reaction.  She also had vasovagal-like reaction after Vectibix, before 5-FU on 08/02/20 but no issues on 10/02/20. Due to skin cracking and concern for 5-FU infusion reactions she was switched to Xeloda.   ?-She has developed significant hand-foot syndrome and Xeloda was reduced to 1500 mg BID 2 weeks on/1 week off on 01/20/21. She continued to have skin toxicity, so we switched her to  7 days on/7 days off.  ?-restaging CT CAP on 06/20/21 showed overall stable disease. ?-she is tolerating Xeloda 1500 mg BID 7 days on/7 days off well overall. She has stable skin dryness and mild pealing, as to be expected. ?-labs reviewed, overall WNL and adequate for Vectibix today  ?  ?2. Chemo Toxicities: Skin and nail changes, hand-foot syndrome, hypomagnesia, acid reflux ?-secondary to 5-FU and worse on Xeloda ?-tolerating better on 1500 mg 7 days on/7 days off ?-she is currently taking magnesium 800 mg TID. Her mag remains low, she will get IV mag. ?-she has acid reflux symptoms and uses tums and pepcid. I advised her not to take pepcid at the same time as the Xeloda. ?  ?3. Grade 1 neuropathy  ?-S/p cycle 5, oxaliplatin dose reduced and eventually discontinued after reaction 09/18/20 ?-Persistent neuropathy at the feet and fingertips, mild ?  ?4. Chronic Lower abdominal pain, Nausea ?-She has had chronic low abdominal pain for 5 years ranging up to 4 times a week. She notes her pain worsened in the last 3 years, but no work-up until 2021.   ?-abdominal pain improved on chemo.  mild and intermittent. Does not require medication  ?-encouraged her to use Ensure/boost if she has decreased appetite  ?-f/up with dietician ?  ?5. HTN, DM, Heart Murmur, Arthritis ?-Per PCP and Cardiologist Dr Einar Gip ?-DM controlled, no pre-existing neuropathy ?  ?6. 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 ?-She lives with husband and has brothers and sister in and out of town. Her only child passed in recent years. ?  ?7. Goals of care ?-we discussed the incurable but treatable nature of her stage IV cancer and general prognosis.  She understands the prognosis is poor if she does not tolerate or respond well to chemo ?-she understands the goal is palliative ?-full code for now  ?  ?  ?PLAN: ?-proceed with Vectibix today and continue every 3 weeks ?-continue Xeloda at 1571m q12h, 7 days on and 7 days off,  I refilled today ?-I refilled her magnesium, she will continue 4 times a day ?-lab, f/u, and Vectibix every 3 weeks ?-Next restaging scan in August ? ? ?No problem-specific Assessment & Plan notes found for this encounter. ? ? ?SUMMARY OF ONCOLOGIC HISTORY: ?Oncology History Overview Note  ?Cancer Staging ?No matching staging information was found for the patient. ? ?  ?metastatic colon cancer to liver  ?06/20/2019 Procedure  ? Colonoscopy by Dr MRush Landmark4/13/21  ?IMPRESSION ?-Seven 3 to 10 mm polyps in the sigmoid colon, in the transverse colon and in the escending colon, removed with a cold snare. Resected and retrireved.  ?-One 226mpolyp in the descending colon. Biopsies. Tattoes.  ?-Mediaum sized lipoma in the ascending colon.  ? ?FINAL DIAGNOSIS:  ?A.Colon, Descending, Polyp, Polectomy:  ?-FRAGMENTS OF TUBULAR ADENOMA WITH DIFFUCE HIGH GRADE DYSPLASIA. See Comment ?B. Colon, Ascending, polyp, Polypectomy:  ?-TUBULAR ADENOMA ?-No high grade dysplasia or malignancy.  ?C. Colon, TRansverse, Polyo, polectomy:  ?-TUBULAR ADENOMA ?-No high grade dysplasia or malignancy.  ?D. Colon, Sigmoid, Polyp, Polypectomy:  ?-HYPERPLASTIC POLYP ?  ?11/07/2019 Imaging  ? USKoreabdomen 11/07/19  ?IMPRESSION: ?1. Two solid masses in the liver are nonspecific. Recommend MRI ?abdomen with and without contrast for further evaluation. ?  ?12/15/2019 Imaging  ? MRI Abdomen 12/15/19  ?IMPRESSION: ?1. There are two large masses in the liver with appearance favoring ?metastatic disease or hepatocellular carcinoma or ?cholangiocarcinoma. A benign etiology is highly unlikely given the ?enhancement pattern and associated adenopathy. ?2. Considerable porta hepatis and retroperitoneal adenopathy. Some ?of the confluent porta hepatis tumor is potentially infiltrative and ?abuts the pancreatic body along its right upper margin, making it ?difficult to completely exclude the possibility of pancreatic ?adenocarcinoma primary. Possibilities helpful in  further workup ?might include tissue diagnosis, endoscopic ultrasound, or nuclear ?medicine PET-CT. ?3. Pancreas divisum. ?4. Lumbar spondylosis and degenerative disc disease. ?5. Despite efforts by the technologist and patient, motion artifact ?is present on today's exam and could not be eliminated. This reduces ?exam sensitivity and specificity. ?  ?12/22/2019 Procedure  ? Upper Endoscopy by Dr HuBenson Norway0/15/21  ?IMPRESSION ?- One lymph node was visualized and measured in the porta hepatis region. Fine needle ?aspiration performed  ?  ?12/22/2019 Initial Biopsy  ? A. LIVER, PORTA HEPATIS MASS, FINE NEEDLE 12/22/19 ?ASPIRATION:  ?FINAL MICROSCOPIC DIAGNOSIS:  ?- Malignant cells consistent with metastatic adenocarcinoma ?  ?01/01/2020 Initial Diagnosis  ? 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  ?01/08/2020 Initial Biopsy  ? FINAL MICROSCOPIC DIAGNOSIS:  ? ?A. LIVER, LEFT LOBE, BIOPSY:  ?- Metastatic adenocarcinoma, consistent with a colorectal primary.  See  ?comment  ? ? ? ? ?COMMENT:  ? ?Immunohistochemical stains show the tumor cells are positive for CK20  ?and CDX2 but negative for CK7, consistent with above interpretation.  ?Dr. FeBurr Medicoas notified on 01/10/2020 ?  ?01/08/2020 Genetic Testing  ? [name]  ?[name] wildtype and [name]/[name] mutations which make her eligible for target biological agent Vectibix. ?  ?01/24/2020 Procedure  ? PAC placed 01/24/20 ?  ?01/25/2020 -  Chemotherapy  ? first line FOLFOX starting 01/25/2020, Vextibix  added with C2 (02/06/20) ?  ?06/20/2020 Imaging  ? CT CAP  ?IMPRESSION: ?  1. Continued interval reduction in size and conspicuity of a ?subsolid nodule of the peripheral left upper lobe. ?2. Unchanged prominent pretracheal and subcarinal lymph nodes. ?3. Redemonstrated partially calcified low-attenuation liver masses, ?slightly decreased in size compared to prior examination. ?4. Slight interval decrease in size of a portacaval lymph node or ?conglomerate and retroperitoneal lymph  nodes. ?5. Findings are consistent with continued treatment response of ?nodal, pulmonary, and hepatic metastatic disease. ?6. Coronary artery disease. ?  ?Aortic Atherosclerosis (ICD10-I70.0). ?  ?  ?8/3/

## 2021-07-11 NOTE — Patient Instructions (Signed)
[facility]  Discharge Instructions: ?Thank you for choosing [facility] to provide your oncology and hematology care.  ? ?If you have a lab appointment with the [facility], please go directly to the [facility] and check in at the registration area. ?  ?Wear comfortable clothing and clothing appropriate for easy access to any Portacath or PICC line.  ? ?We strive to give you quality time with your provider. You may need to reschedule your appointment if you arrive late (15 or more minutes).  Arriving late affects you and other patients whose appointments are after yours.  Also, if you miss three or more appointments without notifying the office, you may be dismissed from the clinic at the provider?s discretion.    ?  ?For prescription refill requests, have your pharmacy contact our office and allow 72 hours for refills to be completed.   ? ?Today you received the following chemotherapy and/or immunotherapy agents vectibix    ?  ?To help prevent nausea and vomiting after your treatment, we encourage you to take your nausea medication as directed. ? ?BELOW ARE SYMPTOMS THAT SHOULD BE REPORTED IMMEDIATELY: ?*FEVER GREATER THAN 100.4 F (38 ?C) OR HIGHER ?*CHILLS OR SWEATING ?*NAUSEA AND VOMITING THAT IS NOT CONTROLLED WITH YOUR NAUSEA MEDICATION ?*UNUSUAL SHORTNESS OF BREATH ?*UNUSUAL BRUISING OR BLEEDING ?*URINARY PROBLEMS (pain or burning when urinating, or frequent urination) ?*BOWEL PROBLEMS (unusual diarrhea, constipation, pain near the anus) ?TENDERNESS IN MOUTH AND THROAT WITH OR WITHOUT PRESENCE OF ULCERS (sore throat, sores in mouth, or a toothache) ?UNUSUAL RASH, SWELLING OR PAIN  ?UNUSUAL VAGINAL DISCHARGE OR ITCHING  ? ?Items with * indicate a potential emergency and should be followed up as soon as possible or go to the Emergency Department if any problems should occur. ? ?Please show the CHEMOTHERAPY ALERT CARD or IMMUNOTHERAPY ALERT CARD at check-in to  the Emergency Department and triage nurse. ? ?Should you have questions after your visit or need to cancel or reschedule your appointment, please contact [facility]  Dept: [phone]  and follow the prompts.  Office hours are 8:00 a.m. to 4:30 p.m. Monday - Friday. Please note that voicemails left after 4:00 p.m. may not be returned until the following business day.  We are closed weekends and major holidays. You have access to a nurse at all times for urgent questions. Please call the main number to the clinic Dept: [phone] and follow the prompts. ? ? ?For any non-urgent questions, you may also contact your provider using MyChart. We now offer e-Visits for anyone 68 and older to request care online for non-urgent symptoms. For details visit [email or web address]. ?  ?Also download the MyChart app! Go to the app store, search "MyChart", open the app, select [facility], and log in with your MyChart username and password. ? ?Due to Covid, a mask is required upon entering the hospital/clinic. If you do not have a mask, one will be given to you upon arrival. For doctor visits, patients may have 1 support person aged 84 or older with them. For treatment visits, patients cannot have anyone with them due to current Covid guidelines and our immunocompromised population.  ? ?

## 2021-07-12 ENCOUNTER — Encounter: Payer: Self-pay | Admitting: Hematology

## 2021-07-18 ENCOUNTER — Telehealth: Payer: Self-pay

## 2021-07-18 ENCOUNTER — Telehealth: Payer: Self-pay | Admitting: Hematology

## 2021-07-18 ENCOUNTER — Other Ambulatory Visit (HOSPITAL_COMMUNITY): Payer: Self-pay

## 2021-07-18 NOTE — Telephone Encounter (Signed)
Oral Oncology Patient Advocate Encounter ? ?Was successful in securing patient a $10000 grant from [employer] to provide copayment coverage for Xeloda.  This will keep the out of pocket expense at $0.   ?  ?[employer] ID: [id number] ? ?I have spoken with the patient. ?  ?The billing information is as follows and has been shared with [facility].  ?  ?RxBin: [id number] ?PCN: [id number] ?Member ID: [id number] ?Group ID: [id number] ?Dates of Eligibility: 06/18/21 through 06/18/22 ? ?Fund:  Colorectal ? ?Wynn Maudlin CPHT ?Specialty Pharmacy Patient Advocate ?[facility] ?Phone [phone] ?Fax [phone] ?07/18/2021 11:17 AM ?  ? ? ?

## 2021-07-18 NOTE — Telephone Encounter (Signed)
Left message with follow-up appointment per 5/5 los. ?

## 2021-07-31 ENCOUNTER — Other Ambulatory Visit: Payer: Self-pay

## 2021-07-31 ENCOUNTER — Inpatient Hospital Stay (HOSPITAL_BASED_OUTPATIENT_CLINIC_OR_DEPARTMENT_OTHER): Payer: Medicare (Managed Care) | Admitting: Hematology

## 2021-07-31 ENCOUNTER — Inpatient Hospital Stay: Payer: Medicare (Managed Care)

## 2021-07-31 ENCOUNTER — Encounter: Payer: Self-pay | Admitting: Hematology

## 2021-07-31 VITALS — BP 129/75 | HR 65 | Temp 98.2°F | Resp 18 | Ht 66.0 in | Wt 184.6 lb

## 2021-07-31 DIAGNOSIS — Z5112 Encounter for antineoplastic immunotherapy: Secondary | ICD-10-CM | POA: Diagnosis not present

## 2021-07-31 DIAGNOSIS — C221 Intrahepatic bile duct carcinoma: Secondary | ICD-10-CM

## 2021-07-31 DIAGNOSIS — Z7189 Other specified counseling: Secondary | ICD-10-CM

## 2021-07-31 DIAGNOSIS — Z95828 Presence of other vascular implants and grafts: Secondary | ICD-10-CM

## 2021-07-31 DIAGNOSIS — C189 Malignant neoplasm of colon, unspecified: Secondary | ICD-10-CM

## 2021-07-31 LAB — CBC WITH DIFFERENTIAL (CANCER CENTER ONLY)
Abs Immature Granulocytes: 0.01 10*3/uL (ref 0.00–0.07)
Basophils Absolute: 0 10*3/uL (ref 0.0–0.1)
Basophils Relative: 1 %
Eosinophils Absolute: 0.1 10*3/uL (ref 0.0–0.5)
Eosinophils Relative: 1 %
HCT: 34.7 % — ABNORMAL LOW (ref 36.0–46.0)
Hemoglobin: 12 g/dL (ref 12.0–15.0)
Immature Granulocytes: 0 %
Lymphocytes Relative: 23 %
Lymphs Abs: 1.4 10*3/uL (ref 0.7–4.0)
MCH: 33.5 pg (ref 26.0–34.0)
MCHC: 34.6 g/dL (ref 30.0–36.0)
MCV: 96.9 fL (ref 80.0–100.0)
Monocytes Absolute: 0.5 10*3/uL (ref 0.1–1.0)
Monocytes Relative: 9 %
Neutro Abs: 4.1 10*3/uL (ref 1.7–7.7)
Neutrophils Relative %: 66 %
Platelet Count: 232 10*3/uL (ref 150–400)
RBC: 3.58 MIL/uL — ABNORMAL LOW (ref 3.87–5.11)
RDW: 14.3 % (ref 11.5–15.5)
WBC Count: 6.2 10*3/uL (ref 4.0–10.5)
nRBC: 0 % (ref 0.0–0.2)

## 2021-07-31 LAB — CMP (CANCER CENTER ONLY)
ALT: 29 U/L (ref 0–44)
AST: 25 U/L (ref 15–41)
Albumin: 4.3 g/dL (ref 3.5–5.0)
Alkaline Phosphatase: 70 U/L (ref 38–126)
Anion gap: 6 (ref 5–15)
BUN: 13 mg/dL (ref 8–23)
CO2: 30 mmol/L (ref 22–32)
Calcium: 9.7 mg/dL (ref 8.9–10.3)
Chloride: 101 mmol/L (ref 98–111)
Creatinine: 0.74 mg/dL (ref 0.44–1.00)
GFR, Estimated: >60 mL/min (ref >60)
Glucose, Bld: 100 mg/dL — ABNORMAL HIGH (ref 70–99)
Potassium: 3.7 mmol/L (ref 3.5–5.1)
Sodium: 137 mmol/L (ref 135–145)
Total Bilirubin: 0.4 mg/dL (ref 0.3–1.2)
Total Protein: 7.8 g/dL (ref 6.5–8.1)

## 2021-07-31 LAB — CEA (IN HOUSE-CHCC): CEA (CHCC-In House): <1 ng/mL (ref 0.00–5.00)

## 2021-07-31 LAB — MAGNESIUM: Magnesium: 1.6 mg/dL — ABNORMAL LOW (ref 1.7–2.4)

## 2021-07-31 MED ORDER — SODIUM CHLORIDE 0.9% FLUSH
10.0000 mL | INTRAVENOUS | Status: DC | PRN
  Administered 2021-07-31: 10 mL

## 2021-07-31 MED ORDER — SODIUM CHLORIDE 0.9 % IV SOLN
6.0000 mg/kg | Freq: Once | INTRAVENOUS | Status: AC
  Administered 2021-07-31: 500 mg via INTRAVENOUS
  Filled 2021-07-31: qty 20

## 2021-07-31 MED ORDER — HEPARIN SOD (PORK) LOCK FLUSH 100 UNIT/ML IV SOLN
500.0000 [IU] | Freq: Once | INTRAVENOUS | Status: AC | PRN
  Administered 2021-07-31: 500 [IU]

## 2021-07-31 MED ORDER — MAGNESIUM SULFATE 4 GM/100ML IV SOLN
4.0000 g | Freq: Once | INTRAVENOUS | Status: AC
  Administered 2021-07-31: 4 g via INTRAVENOUS
  Filled 2021-07-31: qty 100

## 2021-07-31 MED ORDER — SODIUM CHLORIDE 0.9% FLUSH
10.0000 mL | Freq: Once | INTRAVENOUS | Status: AC
  Administered 2021-07-31: 10 mL

## 2021-07-31 MED ORDER — SODIUM CHLORIDE 0.9 % IV SOLN: Freq: Once | INTRAVENOUS | Status: AC

## 2021-07-31 NOTE — Progress Notes (Signed)
[place]   Telephone:([phone]) [phone] Fax:(336) [phone]   Clinic Follow up Note   Patient Care Team: [name], MD as PCP - General (Internal Medicine) [name], RN (Inactive) as Oncology Nurse Navigator [name], MD as Consulting Physician (Oncology) [name], MD as Consulting Physician (Gastroenterology)  Date of Service:  07/31/2021  CHIEF COMPLAINT: f/u of metastatic colon cancer  CURRENT THERAPY:  -Xeloda started 01/25/20, currently on 1500 mg BID days 1-7 q14d -Vectibix, q3weeks, started 02/06/20  ASSESSMENT & PLAN:  Lindsay [name] is a 66 y.o. female with   1. 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 MSS, [name]/[name] wildtype -Her 12/15/19 MRI abdomen showed 2 large liver masses and bulky porta hepatis and retroperitoneal adenopathy. -Her EUS from 12/22/19 showed 3.5cm LN in porta hepatis region and biopsy confirmed metastatic adenocarcinoma, insufficient tissue for IHC study but morphology does not support HCC. -Liver biopsy 01/08/20 confirmed adenocarcinoma, immunostain supporting primary colorectal cancer -PET scan showed: known liver metastasis; diffuse thoracic and abdominal adenopathy; hypermetabolic 1.8 cm pulmonary nodule in LUL; focal hypermetabolic lesion in the splenic fixture of the colon. -She began first line systemic FOLFOX on 01/25/20, goal is palliative. Scans show positive response to treatment. -FO shows [name]/[name] wild-type, she is a candidate for EGFR inhibitor Panitumumab which was added with C2 -Oxaliplatin was stopped after cycle 18 due to reaction.  She also had vasovagal-like reaction after Vectibix, before 5-FU on 08/02/20 but no issues on 10/02/20. Due to skin cracking and concern for 5-FU infusion reactions she was switched to Xeloda.   -She has developed significant hand-foot syndrome and Xeloda was reduced to 1500 mg BID 2 weeks on/1 week off on 01/20/21. She continued to have skin toxicity, so we switched her  to 7 days on/7 days off.  -restaging CT CAP on 06/20/21 showed overall stable disease. -she is tolerating Xeloda 1500 mg BID 7 days on/7 days off well overall. She has stable skin dryness and mild peeling, as to be expected; she continues to manage well.  She is tolerating Vectibix very well except mild hypomagnesemia, no significant skin rash. -labs reviewed, overall WNL and adequate for Vectibix today    2. Chemo Toxicities: Skin and nail changes, hand-foot syndrome, hypomagnesia, acid reflux -secondary to 5-FU and worse on Xeloda -tolerating better on 1500 mg 7 days on/7 days off -she is currently taking magnesium 800 mg QID. -she has acid reflux symptoms and uses tums and pepcid. I advised her not to take pepcid at the same time as the Xeloda.   3. Grade 1 neuropathy  -S/p cycle 5, oxaliplatin dose reduced and eventually discontinued after reaction 09/18/20 -Persistent neuropathy at the feet and fingertips, mild  4. HTN, DM, Heart Murmur, Arthritis -Per PCP and Cardiologist Dr Einar Gip -DM controlled, no pre-existing neuropathy   5. Financial and Social Support -She has [employer]. She is retired. -She lives with husband and has brothers and sister in and out of town. Her only child passed in recent years.   6. Goals of care -we discussed the incurable but treatable nature of her stage IV cancer and general prognosis. She understands the prognosis is poor if she does not tolerate or respond well to chemo -she understands the goal is palliative -full code for now      PLAN: -proceed with Vectibix today and continue every 3 weeks -continue Xeloda at 1525m q12h, 7 days on and 7 days off -lab, f/u, and Vectibix every 3 weeks -Next  restaging scan in August   No problem-specific Assessment & Plan notes found for this encounter.   SUMMARY OF ONCOLOGIC HISTORY: Oncology History Overview Note  Cancer Staging No matching staging information was found for the patient.     metastatic colon cancer to liver  06/20/2019 Procedure   Colonoscopy by Dr [name] 06/20/19  IMPRESSION -Seven 3 to 10 mm polyps in the sigmoid colon, in the transverse colon and in the escending colon, removed with a cold snare. Resected and retrireved.  -One 80m polyp in the descending colon. Biopsies. Tattoes.  -Mediaum sized lipoma in the ascending colon.   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 See Comment B. Colon, Ascending, polyp, Polypectomy:  -TUBULAR ADENOMA -No high grade dysplasia or malignancy.  C. Colon, TRansverse, Polyo, polectomy:  -TUBULAR ADENOMA -No high grade dysplasia or malignancy.  D. Colon, Sigmoid, Polyp, Polypectomy:  -HYPERPLASTIC POLYP   11/07/2019 Imaging   [place]domen 11/07/19  IMPRESSION: 1. Two solid masses in the liver are nonspecific. Recommend MRI abdomen with and without contrast for further evaluation.   12/15/2019 Imaging   MRI Abdomen 12/15/19  IMPRESSION: 1. There are two large masses in the liver with appearance favoring metastatic disease or hepatocellular carcinoma or cholangiocarcinoma. A benign etiology is highly unlikely given the enhancement pattern and associated adenopathy. 2. Considerable porta hepatis and retroperitoneal adenopathy. Some of the confluent porta hepatis tumor is potentially infiltrative and abuts the pancreatic body along its right upper margin, making it difficult to completely exclude the possibility of pancreatic adenocarcinoma primary. Possibilities helpful in further workup might include tissue diagnosis, endoscopic ultrasound, or nuclear medicine PET-CT. 3. Pancreas divisum. 4. Lumbar spondylosis and degenerative disc disease. 5. Despite efforts by the technologist and patient, motion artifact is present on today's exam and could not be eliminated. This reduces exam sensitivity and specificity.   12/22/2019 Procedure    Upper Endoscopy by Dr [name]10/15/21  IMPRESSION - One lymph node was visualized and measured in the porta hepatis region. Fine needle aspiration performed    12/22/2019 Initial Biopsy   A. LIVER, PORTA HEPATIS MASS, FINE NEEDLE 12/22/19 ASPIRATION:  FINAL MICROSCOPIC DIAGNOSIS:  - Malignant cells consistent with metastatic adenocarcinoma   01/01/2020 Initial Diagnosis   Intrahepatic cholangiocarcinoma ([place]   01/08/2020 Initial Biopsy   FINAL MICROSCOPIC DIAGNOSIS:   A. LIVER, LEFT LOBE, BIOPSY:  - Metastatic adenocarcinoma, consistent with a colorectal primary.  See  comment      COMMENT:   Immunohistochemical stains show the tumor cells are positive for CK20  and CDX2 but negative for CK7, consistent with above interpretation.  Dr. [name] notified on 01/10/2020   01/08/2020 Genetic Testing   [name]  [name] wildtype and [name]/[name] mutations which make her eligible for target biological agent Vectibix.   01/24/2020 Procedure   PAC placed 01/24/20   01/25/2020 -  Chemotherapy   first line FOLFOX starting 01/25/2020, Vextibix  added with C2 (02/06/20)   06/20/2020 Imaging   CT CAP  IMPRESSION: 1. Continued interval reduction in size and conspicuity of a subsolid nodule of the peripheral left upper lobe. 2. Unchanged prominent pretracheal and subcarinal lymph nodes. 3. Redemonstrated partially calcified low-attenuation liver masses, slightly decreased in size compared to prior examination. 4. Slight interval decrease in size of a portacaval lymph node or conglomerate and retroperitoneal lymph nodes. 5. Findings are consistent with continued treatment response of nodal, pulmonary, and hepatic metastatic disease. 6. Coronary  artery disease.   Aortic Atherosclerosis (ICD10-I70.0).     10/09/2020 Imaging   IMPRESSION: 1. Slight decrease in size of dominant liver mass with smaller liver mass within 1-2 mm of prior measurement. 2. Stable appearance of celiac  lymph and retroperitoneal lymph nodes. Dominant node with calcification in the gastrohepatic ligament as described. 3. Continued decrease in size of LEFT upper lobe nodule. 4. Three-vessel coronary artery calcification. 5. Aortic atherosclerosis.      INTERVAL HISTORY:  Lindsay [name] is here for a follow up of metastatic colon cancer. She was last seen by me on 07/11/21. She presents to the clinic alone. She reports she is doing well overall, stable skin toxicity. She denies new concerns.   All other systems were reviewed with the patient and are negative.  MEDICAL HISTORY:  Past Medical History:  Diagnosis Date   Arthritis    Diabetes mellitus without complication ([place])    Hypertension    met colon ca to liver 01/2020    SURGICAL HISTORY: Past Surgical History:  Procedure Laterality Date   ABDOMINAL HYSTERECTOMY     ANTERIOR AND POSTERIOR REPAIR WITH SACROSPINOUS FIXATION N/A 07/16/2016   Procedure: ANTERIOR AND POSTERIOR REPAIR WITH SACROSPINOUS FIXATION;  Surgeon: Everlene Farrier, MD;  Location: [facility];  Service: Gynecology;  Laterality: N/A;   CYSTOSCOPY  07/16/2016   Procedure: CYSTOSCOPY;  Surgeon: Everlene Farrier, MD;  Location: [facility];  Service: Gynecology;;   ESOPHAGOGASTRODUODENOSCOPY (EGD) WITH PROPOFOL N/A 12/22/2019   Procedure: ESOPHAGOGASTRODUODENOSCOPY (EGD) WITH PROPOFOL;  Surgeon: [name], MD;  Location: [facility];  Service: Endoscopy;  Laterality: N/A;   FINE NEEDLE ASPIRATION N/A 12/22/2019   Procedure: FINE NEEDLE ASPIRATION (FNA) LINEAR;  Surgeon: [name], MD;  Location: [facility];  Service: Endoscopy;  Laterality: N/A;   IR IMAGING GUIDED PORT INSERTION  01/24/2020   LAPAROSCOPIC VAGINAL HYSTERECTOMY WITH SALPINGECTOMY Bilateral 07/16/2016   Procedure: LAPAROSCOPIC ASSISTED VAGINAL HYSTERECTOMY WITH SALPINGECTOMY;  Surgeon: Everlene Farrier, MD;  Location: [facility];  Service: Gynecology;  Laterality: Bilateral;   MYOMECTOMY ABDOMINAL APPROACH      THYROID SURGERY     tyroid     UPPER ESOPHAGEAL ENDOSCOPIC ULTRASOUND (EUS) N/A 12/22/2019   Procedure: UPPER ESOPHAGEAL ENDOSCOPIC ULTRASOUND (EUS);  Surgeon: [name], MD;  Location: [facility];  Service: Endoscopy;  Laterality: N/A;    I have reviewed the social history and family history with the patient and they are unchanged from previous note.  ALLERGIES:  is allergic to oxaliplatin.  MEDICATIONS:  Current Outpatient Medications  Medication Sig Dispense Refill   aspirin EC 81 MG tablet Take 1 tablet (81 mg total) by mouth daily. 90 tablet 3   atorvastatin (LIPITOR) 20 MG tablet Take 20 mg by mouth at bedtime.   1   capecitabine (XELODA) 500 MG tablet Take 3 tablets (1,500 mg total) by mouth 2 (two) times daily after a meal. Take for 7 days, followed by 7 days off. Repeat every 14 days. 84 tablet 1   clindamycin (CLINDAGEL) 1 % gel Apply topically 2 (two) times daily. 60 g 2   clobetasol cream (TEMOVATE) 3.81 % Apply 1 application topically daily.     hydrochlorothiazide (MICROZIDE) 12.5 MG capsule Take 12.5 mg by mouth daily.     lidocaine-prilocaine (EMLA) cream Apply 1 application topically as needed. 30 g 1   magic mouthwash w/lidocaine SOLN Take 5 mLs by mouth 4 (four) times daily. 475 mL 2   magnesium oxide (MAG-OX) 400 MG tablet Take 2 tablets (  800 mg total) by mouth 3 (three) times daily. 180 tablet 2   omeprazole (PRILOSEC) 20 MG capsule Take 1 capsule (20 mg total) by mouth daily. 30 capsule 2   ondansetron (ZOFRAN) 8 MG tablet TAKE 1 TABLET BY MOUTH EVERY 8 HOURS AS NEEDED FOR NAUSEA OR VOMITING 20 tablet 0   potassium chloride SA (KLOR-CON M) 20 MEQ tablet Take 1 tablet (20 mEq total) by mouth 2 (two) times daily. 180 each 1   prochlorperazine (COMPAZINE) 10 MG tablet Take 1 tablet (10 mg total) by mouth every 6 (six) hours as needed for nausea or vomiting. 30 tablet 2   spironolactone (ALDACTONE) 25 MG tablet Take 25 mg by mouth daily with breakfast.     urea  (CARMOL) 10 % cream Apply topically 2 (two) times daily. 71 g 2   valACYclovir (VALTREX) 1000 MG tablet Take 1 tablet (1,000 mg total) by mouth 2 (two) times daily. 10 tablet 0   verapamil (CALAN) 120 MG tablet Take 120 mg by mouth 3 (three) times daily.     No current facility-administered medications for this visit.   Facility-Administered Medications Ordered in Other Visits  Medication Dose Route Frequency Provider Last Rate Last Admin   sodium chloride flush (NS) 0.9 % injection 10 mL  10 mL Intracatheter PRN [name], MD   10 mL at 07/31/21 1823    PHYSICAL EXAMINATION: ECOG PERFORMANCE STATUS: 1 - Symptomatic but completely ambulatory  Vitals:   07/31/21 1449  BP: 129/75  Pulse: 65  Resp: 18  Temp: 98.2 F (36.8 C)  SpO2: 100%   Wt Readings from Last 3 Encounters:  07/31/21 184 lb 9.6 oz (83.7 kg)  07/11/21 184 lb 12 oz (83.8 kg)  06/19/21 188 lb 8 oz (85.5 kg)     GENERAL:alert, no distress and comfortable SKIN: skin color normal, no rashes or significant lesions EYES: normal, Conjunctiva are pink and non-injected, sclera clear  NEURO: alert & oriented x 3 with fluent speech  LABORATORY DATA:  I have reviewed the data as listed    Latest Ref Rng & Units 07/31/2021    2:06 PM 07/11/2021   12:16 PM 06/16/2021    9:00 AM  CBC  WBC 4.0 - 10.5 K/uL 6.2   5.3   6.3    Hemoglobin 12.0 - 15.0 g/dL 12.0   12.6   12.7    Hematocrit 36.0 - 46.0 % 34.7   36.7   36.8    Platelets 150 - 400 K/uL 232   252   235          Latest Ref Rng & Units 07/31/2021    2:06 PM 07/11/2021   12:16 PM 06/16/2021    9:00 AM  CMP  Glucose 70 - 99 mg/dL 100   128   119    BUN 8 - 23 mg/dL _0 Creatinine 0.44 - 1.00 mg/dL 0.74   0.70   0.61    Sodium 135 - 145 mmol/L 137   135   136    Potassium 3.5 - 5.1 mmol/L 3.7   3.8   3.9    Chloride 98 - 111 mmol/L 101   101   98    CO2 22 - 32 mmol/L _1 Calcium 8.9 - 10.3 mg/dL 9.7   9.7   9.9    Total Protein 6.5 - 8.1  g/dL 7.8  8.3   8.2    Total Bilirubin 0.3 - 1.2 mg/dL 0.4   0.4   0.5    Alkaline Phos 38 - 126 U/L 70   67   78    AST 15 - 41 U/L _0 ALT 0 - 44 U/L 29   32   31        RADIOGRAPHIC STUDIES: I have personally reviewed the radiological images as listed and agreed with the findings in the report. No results found.    No orders of the defined types were placed in this encounter.  All questions were answered. The patient knows to call the clinic with any problems, questions or concerns. No barriers to learning was detected.      [name], MD 07/31/2021   I, Wilburn Mylar, am acting as scribe for [name], MD.   I have reviewed the above documentation for accuracy and completeness, and I agree with the above.

## 2021-07-31 NOTE — Patient Instructions (Signed)
[facility]  Discharge Instructions: Thank you for choosing [facility] to provide your oncology and hematology care.   If you have a lab appointment with the [facility], please go directly to the [facility] and check in at the registration area.   Wear comfortable clothing and clothing appropriate for easy access to any Portacath or PICC line.   We strive to give you quality time with your provider. You may need to reschedule your appointment if you arrive late (15 or more minutes).  Arriving late affects you and other patients whose appointments are after yours.  Also, if you miss three or more appointments without notifying the office, you may be dismissed from the clinic at the provider's discretion.      For prescription refill requests, have your pharmacy contact our office and allow 72 hours for refills to be completed.    Today you received the following chemotherapy and/or immunotherapy agents: Vectibix   To help prevent nausea and vomiting after your treatment, we encourage you to take your nausea medication as directed.  BELOW ARE SYMPTOMS THAT SHOULD BE REPORTED IMMEDIATELY: *FEVER GREATER THAN 100.4 F (38 C) OR HIGHER *CHILLS OR SWEATING *NAUSEA AND VOMITING THAT IS NOT CONTROLLED WITH YOUR NAUSEA MEDICATION *UNUSUAL SHORTNESS OF BREATH *UNUSUAL BRUISING OR BLEEDING *URINARY PROBLEMS (pain or burning when urinating, or frequent urination) *BOWEL PROBLEMS (unusual diarrhea, constipation, pain near the anus) TENDERNESS IN MOUTH AND THROAT WITH OR WITHOUT PRESENCE OF ULCERS (sore throat, sores in mouth, or a toothache) UNUSUAL RASH, SWELLING OR PAIN  UNUSUAL VAGINAL DISCHARGE OR ITCHING   Items with * indicate a potential emergency and should be followed up as soon as possible or go to the Emergency Department if any problems should occur.  Please show the CHEMOTHERAPY ALERT CARD or IMMUNOTHERAPY ALERT CARD at check-in to the  Emergency Department and triage nurse.  Should you have questions after your visit or need to cancel or reschedule your appointment, please contact [facility]  Dept: [phone]  and follow the prompts.  Office hours are 8:00 a.m. to 4:30 p.m. Monday - Friday. Please note that voicemails left after 4:00 p.m. may not be returned until the following business day.  We are closed weekends and major holidays. You have access to a nurse at all times for urgent questions. Please call the main number to the clinic Dept: [phone] and follow the prompts.   For any non-urgent questions, you may also contact your provider using MyChart. We now offer e-Visits for anyone 59 and older to request care online for non-urgent symptoms. For details visit [email or web address].   Also download the MyChart app! Go to the app store, search "MyChart", open the app, select [facility], and log in with your MyChart username and password.  Due to Covid, a mask is required upon entering the hospital/clinic. If you do not have a mask, one will be given to you upon arrival. For doctor visits, patients may have 1 support person aged 11 or older with them. For treatment visits, patients cannot have anyone with them due to current Covid guidelines and our immunocompromised population.   Magnesium Sulfate Injection What is this medication? MAGNESIUM SULFATE (mag NEE zee um SUL fate) prevents and treats low levels of magnesium in your body. It may also be used to prevent and treat seizures during pregnancy in people with high blood pressure disorders, such as preeclampsia or eclampsia. Magnesium plays an  important role in maintaining the health of your muscles and nervous system. This medicine may be used for other purposes; ask your health care provider or pharmacist if you have questions. What should I tell my care team before I take this medication? They need to know if you have any of these  conditions: Heart disease History of irregular heart beat Kidney disease An unusual or allergic reaction to magnesium sulfate, medications, foods, dyes, or preservatives Pregnant or trying to get pregnant Breast-feeding How should I use this medication? This medication is for infusion into a vein. It is given in a hospital or clinic setting. Talk to your care team about the use of this medication in children. While this medication may be prescribed for selected conditions, precautions do apply. Overdosage: If you think you have taken too much of this medicine contact a poison control center or emergency room at once. NOTE: This medicine is only for you. Do not share this medicine with others. What if I miss a dose? This does not apply. What may interact with this medication? Certain medications for anxiety or sleep Certain medications for seizures like phenobarbital Digoxin Medications that relax muscles for surgery Narcotic medications for pain This list may not describe all possible interactions. Give your health care provider a list of all the medicines, herbs, non-prescription drugs, or dietary supplements you use. Also tell them if you smoke, drink alcohol, or use illegal drugs. Some items may interact with your medicine. What should I watch for while using this medication? Your condition will be monitored carefully while you are receiving this medication. You may need blood work done while you are receiving this medication. What side effects may I notice from receiving this medication? Side effects that you should report to your care team as soon as possible: Allergic reactions--skin rash, itching, hives, swelling of the face, lips, tongue, or throat High magnesium level--confusion, drowsiness, facial flushing, redness, sweating, muscle weakness, fast or irregular heartbeat, trouble breathing Low blood pressure--dizziness, feeling faint or lightheaded, blurry vision Side effects  that usually do not require medical attention (report to your care team if they continue or are bothersome): Headache Nausea This list may not describe all possible side effects. Call your doctor for medical advice about side effects. You may report side effects to FDA at 1-800-FDA-1088. Where should I keep my medication? This medication is given in a hospital or clinic and will not be stored at home. NOTE: This sheet is a summary. It may not cover all possible information. If you have questions about this medicine, talk to your doctor, pharmacist, or health care provider.  2023 Elsevier/Gold Standard (2020-05-09 00:00:00)

## 2021-08-22 ENCOUNTER — Inpatient Hospital Stay: Payer: Medicare (Managed Care) | Attending: Hematology

## 2021-08-22 ENCOUNTER — Inpatient Hospital Stay: Payer: Medicare (Managed Care)

## 2021-08-22 ENCOUNTER — Encounter: Payer: Self-pay | Admitting: Adult Health

## 2021-08-22 ENCOUNTER — Other Ambulatory Visit: Payer: Self-pay

## 2021-08-22 ENCOUNTER — Inpatient Hospital Stay (HOSPITAL_BASED_OUTPATIENT_CLINIC_OR_DEPARTMENT_OTHER): Payer: Medicare (Managed Care) | Admitting: Adult Health

## 2021-08-22 VITALS — BP 146/81 | HR 60 | Temp 97.6°F | Resp 18 | Ht 66.0 in | Wt 182.9 lb

## 2021-08-22 DIAGNOSIS — C19 Malignant neoplasm of rectosigmoid junction: Secondary | ICD-10-CM | POA: Insufficient documentation

## 2021-08-22 DIAGNOSIS — C221 Intrahepatic bile duct carcinoma: Secondary | ICD-10-CM | POA: Diagnosis not present

## 2021-08-22 DIAGNOSIS — Z7189 Other specified counseling: Secondary | ICD-10-CM

## 2021-08-22 DIAGNOSIS — C7802 Secondary malignant neoplasm of left lung: Secondary | ICD-10-CM | POA: Diagnosis not present

## 2021-08-22 DIAGNOSIS — Z86010 Personal history of colonic polyps: Secondary | ICD-10-CM | POA: Diagnosis not present

## 2021-08-22 DIAGNOSIS — E1140 Type 2 diabetes mellitus with diabetic neuropathy, unspecified: Secondary | ICD-10-CM | POA: Diagnosis not present

## 2021-08-22 DIAGNOSIS — R011 Cardiac murmur, unspecified: Secondary | ICD-10-CM | POA: Diagnosis not present

## 2021-08-22 DIAGNOSIS — C787 Secondary malignant neoplasm of liver and intrahepatic bile duct: Secondary | ICD-10-CM | POA: Insufficient documentation

## 2021-08-22 DIAGNOSIS — I10 Essential (primary) hypertension: Secondary | ICD-10-CM | POA: Diagnosis not present

## 2021-08-22 DIAGNOSIS — C778 Secondary and unspecified malignant neoplasm of lymph nodes of multiple regions: Secondary | ICD-10-CM | POA: Diagnosis not present

## 2021-08-22 DIAGNOSIS — Z7984 Long term (current) use of oral hypoglycemic drugs: Secondary | ICD-10-CM | POA: Diagnosis not present

## 2021-08-22 DIAGNOSIS — G620 Drug-induced polyneuropathy: Secondary | ICD-10-CM | POA: Diagnosis not present

## 2021-08-22 DIAGNOSIS — C189 Malignant neoplasm of colon, unspecified: Secondary | ICD-10-CM

## 2021-08-22 DIAGNOSIS — T451X5D Adverse effect of antineoplastic and immunosuppressive drugs, subsequent encounter: Secondary | ICD-10-CM | POA: Diagnosis not present

## 2021-08-22 DIAGNOSIS — Z95828 Presence of other vascular implants and grafts: Secondary | ICD-10-CM

## 2021-08-22 DIAGNOSIS — Z5112 Encounter for antineoplastic immunotherapy: Secondary | ICD-10-CM | POA: Insufficient documentation

## 2021-08-22 DIAGNOSIS — Z87891 Personal history of nicotine dependence: Secondary | ICD-10-CM | POA: Insufficient documentation

## 2021-08-22 DIAGNOSIS — R5383 Other fatigue: Secondary | ICD-10-CM | POA: Insufficient documentation

## 2021-08-22 LAB — CBC WITH DIFFERENTIAL (CANCER CENTER ONLY)
Abs Immature Granulocytes: 0.02 K/uL (ref 0.00–0.07)
Basophils Absolute: 0 K/uL (ref 0.0–0.1)
Basophils Relative: 1 %
Eosinophils Absolute: 0.1 K/uL (ref 0.0–0.5)
Eosinophils Relative: 1 %
HCT: 35 % — ABNORMAL LOW (ref 36.0–46.0)
Hemoglobin: 12.1 g/dL (ref 12.0–15.0)
Immature Granulocytes: 0 %
Lymphocytes Relative: 24 %
Lymphs Abs: 1.2 K/uL (ref 0.7–4.0)
MCH: 33.2 pg (ref 26.0–34.0)
MCHC: 34.6 g/dL (ref 30.0–36.0)
MCV: 96.2 fL (ref 80.0–100.0)
Monocytes Absolute: 0.6 K/uL (ref 0.1–1.0)
Monocytes Relative: 11 %
Neutro Abs: 3.2 K/uL (ref 1.7–7.7)
Neutrophils Relative %: 63 %
Platelet Count: 248 K/uL (ref 150–400)
RBC: 3.64 MIL/uL — ABNORMAL LOW (ref 3.87–5.11)
RDW: 14.8 % (ref 11.5–15.5)
WBC Count: 5.1 K/uL (ref 4.0–10.5)
nRBC: 0 % (ref 0.0–0.2)

## 2021-08-22 LAB — CMP (CANCER CENTER ONLY)
ALT: 29 U/L (ref 0–44)
AST: 25 U/L (ref 15–41)
Albumin: 4.3 g/dL (ref 3.5–5.0)
Alkaline Phosphatase: 86 U/L (ref 38–126)
Anion gap: 6 (ref 5–15)
BUN: 13 mg/dL (ref 8–23)
CO2: 29 mmol/L (ref 22–32)
Calcium: 9.8 mg/dL (ref 8.9–10.3)
Chloride: 103 mmol/L (ref 98–111)
Creatinine: 0.64 mg/dL (ref 0.44–1.00)
GFR, Estimated: >60 mL/min
Glucose, Bld: 98 mg/dL (ref 70–99)
Potassium: 3.7 mmol/L (ref 3.5–5.1)
Sodium: 138 mmol/L (ref 135–145)
Total Bilirubin: 0.3 mg/dL (ref 0.3–1.2)
Total Protein: 7.8 g/dL (ref 6.5–8.1)

## 2021-08-22 LAB — MAGNESIUM: Magnesium: 1.7 mg/dL (ref 1.7–2.4)

## 2021-08-22 LAB — CEA (IN HOUSE-CHCC): CEA (CHCC-In House): <1 ng/mL (ref 0.00–5.00)

## 2021-08-22 MED ORDER — SODIUM CHLORIDE 0.9 % IV SOLN
6.0000 mg/kg | Freq: Once | INTRAVENOUS | Status: AC
  Administered 2021-08-22: 500 mg via INTRAVENOUS
  Filled 2021-08-22: qty 20

## 2021-08-22 MED ORDER — SODIUM CHLORIDE 0.9% FLUSH
10.0000 mL | Freq: Once | INTRAVENOUS | Status: AC
  Administered 2021-08-22: 10 mL

## 2021-08-22 MED ORDER — HEPARIN SOD (PORK) LOCK FLUSH 100 UNIT/ML IV SOLN
500.0000 [IU] | Freq: Once | INTRAVENOUS | Status: AC | PRN
  Administered 2021-08-22: 500 [IU]

## 2021-08-22 MED ORDER — SODIUM CHLORIDE 0.9% FLUSH
10.0000 mL | INTRAVENOUS | Status: DC | PRN
  Administered 2021-08-22: 10 mL

## 2021-08-22 MED ORDER — SODIUM CHLORIDE 0.9 % IV SOLN: Freq: Once | INTRAVENOUS | Status: AC

## 2021-08-22 MED ORDER — MAGNESIUM SULFATE 4 GM/100ML IV SOLN
4.0000 g | Freq: Once | INTRAVENOUS | Status: AC
  Administered 2021-08-22: 4 g via INTRAVENOUS
  Filled 2021-08-22: qty 100

## 2021-08-22 NOTE — Progress Notes (Signed)
[facility] Follow up:    [name], MD [place] [address]   DIAGNOSIS:   SUMMARY OF ONCOLOGIC HISTORY: Oncology History Overview Note  Cancer Staging No matching staging information was found for the patient.    metastatic colon cancer to liver  06/20/2019 Procedure   Colonoscopy by Dr [name] 06/20/19  IMPRESSION -Seven 3 to 10 mm polyps in the sigmoid colon, in the transverse colon and in the escending colon, removed with a cold snare. Resected and retrireved.  -One 39m polyp in the descending colon. Biopsies. Tattoes.  -Mediaum sized lipoma in the ascending colon.   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 See Comment B. Colon, Ascending, polyp, Polypectomy:  -TUBULAR ADENOMA -No high grade dysplasia or malignancy.  C. Colon, TRansverse, Polyo, polectomy:  -TUBULAR ADENOMA -No high grade dysplasia or malignancy.  D. Colon, Sigmoid, Polyp, Polypectomy:  -HYPERPLASTIC POLYP   11/07/2019 Imaging   UKoreaAbdomen 11/07/19  IMPRESSION: 1. Two solid masses in the liver are nonspecific. Recommend MRI abdomen with and without contrast for further evaluation.   12/15/2019 Imaging   MRI Abdomen 12/15/19  IMPRESSION: 1. There are two large masses in the liver with appearance favoring metastatic disease or hepatocellular carcinoma or cholangiocarcinoma. A benign etiology is highly unlikely given the enhancement pattern and associated adenopathy. 2. Considerable porta hepatis and retroperitoneal adenopathy. Some of the confluent porta hepatis tumor is potentially infiltrative and abuts the pancreatic body along its right upper margin, making it difficult to completely exclude the possibility of pancreatic adenocarcinoma primary. Possibilities helpful in further workup might include tissue diagnosis, endoscopic ultrasound, or nuclear medicine PET-CT. 3.  Pancreas divisum. 4. Lumbar spondylosis and degenerative disc disease. 5. Despite efforts by the technologist and patient, motion artifact is present on today's exam and could not be eliminated. This reduces exam sensitivity and specificity.   12/22/2019 Procedure   Upper Endoscopy by Dr [name]10/15/21  IMPRESSION - One lymph node was visualized and measured in the porta hepatis region. Fine needle aspiration performed    12/22/2019 Initial Biopsy   A. LIVER, PORTA HEPATIS MASS, FINE NEEDLE 12/22/19 ASPIRATION:  FINAL MICROSCOPIC DIAGNOSIS:  - Malignant cells consistent with metastatic adenocarcinoma   01/01/2020 Initial Diagnosis   Intrahepatic cholangiocarcinoma ([facility]   01/08/2020 Initial Biopsy   FINAL MICROSCOPIC DIAGNOSIS:   A. LIVER, LEFT LOBE, BIOPSY:  - Metastatic adenocarcinoma, consistent with a colorectal primary.  See  comment      COMMENT:   Immunohistochemical stains show the tumor cells are positive for CK20  and CDX2 but negative for CK7, consistent with above interpretation.  Dr. [name] notified on 01/10/2020   01/08/2020 Genetic Testing   [name]  [name] wildtype and [name]/[name] mutations which make her eligible for target biological agent Vectibix.   01/24/2020 Procedure   PAC placed 01/24/20   01/25/2020 -  Chemotherapy   first line FOLFOX starting 01/25/2020, Vextibix  added with C2 (02/06/20)   06/20/2020 Imaging   CT CAP  IMPRESSION: 1. Continued interval reduction in size and conspicuity of a subsolid nodule of the peripheral left upper lobe. 2. Unchanged prominent pretracheal and subcarinal lymph nodes. 3. Redemonstrated partially calcified low-attenuation liver masses, slightly decreased in size compared to prior examination. 4. Slight interval decrease in size of a portacaval lymph node or conglomerate and retroperitoneal lymph nodes. 5. Findings are consistent with continued treatment response of nodal, pulmonary, and  hepatic  metastatic disease. 6. Coronary artery disease.   Aortic Atherosclerosis (ICD10-I70.0).     10/09/2020 Imaging   IMPRESSION: 1. Slight decrease in size of dominant liver mass with smaller liver mass within 1-2 mm of prior measurement. 2. Stable appearance of celiac lymph and retroperitoneal lymph nodes. Dominant node with calcification in the gastrohepatic ligament as described. 3. Continued decrease in size of LEFT upper lobe nodule. 4. Three-vessel coronary artery calcification. 5. Aortic atherosclerosis.     CURRENT THERAPY: Panitumumab, Xeloda  INTERVAL HISTORY: Lindsay [name] 66 y.o. female returns for follow-up prior to receiving her next cycle of panitumumab then once every 3 weeks.  She is also taking capecitabine/Xeloda 3 tablets twice a day 1 week on and 1 week off.  She is tolerating her treatment well and has no current concerns.  She is mildly fatigued but denies any new pain, diarrhea, or skin changes.   Patient Active Problem List   Diagnosis Date Noted   Sacroiliitis, not elsewhere classified ([place]) 09/18/2020   Malignant essential hypertension 09/18/2020   Port-A-Cath in place 02/21/2020   Goals of care, counseling/discussion 01/29/2020   metastatic colon cancer to liver 01/01/2020   Osteoarthritis of left hip 08/06/2017   Pain of left hip joint 06/29/2017   History of revision of total replacement of right hip joint 09/24/2016   Pelvic prolapse 07/16/2016    is allergic to oxaliplatin.  MEDICAL HISTORY: Past Medical History:  Diagnosis Date   Arthritis    Diabetes mellitus without complication ([place])    Hypertension    met colon ca to liver 01/2020    SURGICAL HISTORY: Past Surgical History:  Procedure Laterality Date   ABDOMINAL HYSTERECTOMY     ANTERIOR AND POSTERIOR REPAIR WITH SACROSPINOUS FIXATION N/A 07/16/2016   Procedure: ANTERIOR AND POSTERIOR REPAIR WITH SACROSPINOUS FIXATION;  Surgeon: Everlene Farrier, MD;  Location: [facility];  Service:  Gynecology;  Laterality: N/A;   CYSTOSCOPY  07/16/2016   Procedure: CYSTOSCOPY;  Surgeon: Everlene Farrier, MD;  Location: [facility];  Service: Gynecology;;   ESOPHAGOGASTRODUODENOSCOPY (EGD) WITH PROPOFOL N/A 12/22/2019   Procedure: ESOPHAGOGASTRODUODENOSCOPY (EGD) WITH PROPOFOL;  Surgeon: [name], MD;  Location: [facility];  Service: Endoscopy;  Laterality: N/A;   FINE NEEDLE ASPIRATION N/A 12/22/2019   Procedure: FINE NEEDLE ASPIRATION (FNA) LINEAR;  Surgeon: [name], MD;  Location: [facility];  Service: Endoscopy;  Laterality: N/A;   IR IMAGING GUIDED PORT INSERTION  01/24/2020   LAPAROSCOPIC VAGINAL HYSTERECTOMY WITH SALPINGECTOMY Bilateral 07/16/2016   Procedure: LAPAROSCOPIC ASSISTED VAGINAL HYSTERECTOMY WITH SALPINGECTOMY;  Surgeon: Everlene Farrier, MD;  Location: [facility];  Service: Gynecology;  Laterality: Bilateral;   MYOMECTOMY ABDOMINAL APPROACH     THYROID SURGERY     tyroid     UPPER ESOPHAGEAL ENDOSCOPIC ULTRASOUND (EUS) N/A 12/22/2019   Procedure: UPPER ESOPHAGEAL ENDOSCOPIC ULTRASOUND (EUS);  Surgeon: [name], MD;  Location: [facility];  Service: Endoscopy;  Laterality: N/A;    SOCIAL HISTORY: Social History   Socioeconomic History   Marital status: Married    Spouse name: Not on file   Number of children: 1   Years of education: Not on file   Highest education level: Not on file  Occupational History   Occupation: retired Glass blower/designer   Tobacco Use   Smoking status: Former    Packs/day: 0.25    Years: 18.00    Total pack years: 4.50    Types: Cigarettes    Quit date: 06/06/2018    Years since  quitting: 3.2   Smokeless tobacco: Never  Vaping Use   Vaping Use: Never used  Substance and Sexual Activity   Alcohol use: Yes    Alcohol/week: 3.0 standard drinks of alcohol    Types: 3 Shots of liquor per week    Comment: socially   Drug use: No   Sexual activity: Yes  Other Topics Concern   Not on file  Social History Narrative   Not on  file   Social Determinants of Health   Financial Resource Strain: Not on file  Food Insecurity: Not on file  Transportation Needs: Not on file  Physical Activity: Not on file  Stress: Not on file  Social Connections: Not on file  Intimate Partner Violence: Not on file    FAMILY HISTORY: Family History  Problem Relation Age of Onset   Aneurysm Mother    Asthma Father     Review of Systems  Constitutional:  Positive for fatigue. Negative for appetite change, chills, fever and unexpected weight change.  HENT:   Negative for hearing loss, lump/mass and trouble swallowing.   Eyes:  Negative for eye problems and icterus.  Respiratory:  Negative for chest tightness, cough and shortness of breath.   Cardiovascular:  Negative for chest pain, leg swelling and palpitations.  Gastrointestinal:  Negative for abdominal distention, abdominal pain, constipation, diarrhea, nausea and vomiting.  Endocrine: Negative for hot flashes.  Genitourinary:  Negative for difficulty urinating.   Musculoskeletal:  Negative for arthralgias.  Skin:  Negative for itching and rash.  Neurological:  Negative for dizziness, extremity weakness, headaches and numbness.  Hematological:  Negative for adenopathy. Does not bruise/bleed easily.  Psychiatric/Behavioral:  Negative for depression. The patient is not nervous/anxious.       PHYSICAL EXAMINATION  ECOG PERFORMANCE STATUS: 1 - Symptomatic but completely ambulatory  Vitals:   08/22/21 1200  BP: (!) 146/81  Pulse: 60  Resp: 18  Temp: 97.6 F (36.4 C)  SpO2: 99%    Physical Exam Constitutional:      General: She is not in acute distress.    Appearance: Normal appearance. She is not toxic-appearing.  HENT:     Head: Normocephalic and atraumatic.  Eyes:     General: No scleral icterus. Cardiovascular:     Rate and Rhythm: Normal rate and regular rhythm.     Pulses: Normal pulses.     Heart sounds: Normal heart sounds.  Pulmonary:     Effort:  Pulmonary effort is normal.     Breath sounds: Normal breath sounds.  Abdominal:     General: Abdomen is flat. Bowel sounds are normal. There is no distension.     Palpations: Abdomen is soft.     Tenderness: There is no abdominal tenderness.  Musculoskeletal:        General: No swelling.     Cervical back: Neck supple.  Lymphadenopathy:     Cervical: No cervical adenopathy.  Skin:    General: Skin is warm and dry.     Findings: No rash.  Neurological:     General: No focal deficit present.     Mental Status: She is alert.  Psychiatric:        Mood and Affect: Mood normal.        Behavior: Behavior normal.     LABORATORY DATA:  CBC    Component Value Date/Time   WBC 5.1 08/22/2021 1146   WBC 15.6 (H) 07/17/2016 0542   RBC 3.64 (L) 08/22/2021 1146   HGB  12.1 08/22/2021 1146   HCT 35.0 (L) 08/22/2021 1146   PLT 248 08/22/2021 1146   MCV 96.2 08/22/2021 1146   MCH 33.2 08/22/2021 1146   MCHC 34.6 08/22/2021 1146   RDW 14.8 08/22/2021 1146   LYMPHSABS 1.2 08/22/2021 1146   MONOABS 0.6 08/22/2021 1146   EOSABS 0.1 08/22/2021 1146   BASOSABS 0.0 08/22/2021 1146    CMP     Component Value Date/Time   NA 138 08/22/2021 1146   K 3.7 08/22/2021 1146   CL 103 08/22/2021 1146   CO2 29 08/22/2021 1146   GLUCOSE 98 08/22/2021 1146   BUN 13 08/22/2021 1146   CREATININE 0.64 08/22/2021 1146   CALCIUM 9.8 08/22/2021 1146   PROT 7.8 08/22/2021 1146   ALBUMIN 4.3 08/22/2021 1146   AST 25 08/22/2021 1146   ALT 29 08/22/2021 1146   ALKPHOS 86 08/22/2021 1146   BILITOT 0.3 08/22/2021 1146   GFRNONAA >60 08/22/2021 1146   GFRAA >60 07/09/2016 0836    ASSESSMENT and THERAPY PLAN:   metastatic colon cancer to liver Leotta is here today for follow-up of her metastatic colon cancer prior to receiving her every 3-week panitumumab infusion.  She has no clinical signs of colon cancer progression.  She will proceed with this today as she is tolerating it well and her labs are all  stable and within parameters.  She will continue on capecitabine 1500 mg p.o. twice daily 1 week on and 1 week off.  She verbalized understanding of the above and understands that she will return in 3 weeks for labs, follow-up with Dr. Burr Medico, and her next infusion.   All questions were answered. The patient knows to call the clinic with any problems, questions or concerns. We can certainly see the patient much sooner if necessary.  Total encounter time:20 minutes*in face-to-face visit time, chart review, lab review, care coordination, order entry, and documentation of the encounter time.    Wilber Bihari, NP 08/22/21 5:34 PM Medical Oncology and Hematology [facility] [place], [place] [address] Tel. [phone]    Fax. [phone]  *Total Encounter Time as defined by the Centers for Medicare and Medicaid Services includes, in addition to the face-to-face time of a patient visit (documented in the note above) non-face-to-face time: obtaining and reviewing outside history, ordering and reviewing medications, tests or procedures, care coordination (communications with other health care professionals or caregivers) and documentation in the medical record.

## 2021-08-22 NOTE — Patient Instructions (Addendum)
[facility]  Discharge Instructions: Thank you for choosing [facility] to provide your oncology and hematology care.   If you have a lab appointment with the Cancer Center, please go directly to the Cancer Center and check in at the registration area.   Wear comfortable clothing and clothing appropriate for easy access to any Portacath or PICC line.   We strive to give you quality time with your provider. You may need to reschedule your appointment if you arrive late (15 or more minutes).  Arriving late affects you and other patients whose appointments are after yours.  Also, if you miss three or more appointments without notifying the office, you may be dismissed from the clinic at the provider's discretion.      For prescription refill requests, have your pharmacy contact our office and allow 72 hours for refills to be completed.    Today you received the following chemotherapy and/or immunotherapy agents: Vectibix.      To help prevent nausea and vomiting after your treatment, we encourage you to take your nausea medication as directed.  BELOW ARE SYMPTOMS THAT SHOULD BE REPORTED IMMEDIATELY: *FEVER GREATER THAN 100.4 F (38 C) OR HIGHER *CHILLS OR SWEATING *NAUSEA AND VOMITING THAT IS NOT CONTROLLED WITH YOUR NAUSEA MEDICATION *UNUSUAL SHORTNESS OF BREATH *UNUSUAL BRUISING OR BLEEDING *URINARY PROBLEMS (pain or burning when urinating, or frequent urination) *BOWEL PROBLEMS (unusual diarrhea, constipation, pain near the anus) TENDERNESS IN MOUTH AND THROAT WITH OR WITHOUT PRESENCE OF ULCERS (sore throat, sores in mouth, or a toothache) UNUSUAL RASH, SWELLING OR PAIN  UNUSUAL VAGINAL DISCHARGE OR ITCHING   Items with * indicate a potential emergency and should be followed up as soon as possible or go to the Emergency Department if any problems should occur.  Please show the CHEMOTHERAPY ALERT CARD or IMMUNOTHERAPY ALERT CARD at check-in to  the Emergency Department and triage nurse.  Should you have questions after your visit or need to cancel or reschedule your appointment, please contact [facility]  Dept: [phone]  and follow the prompts.  Office hours are 8:00 a.m. to 4:30 p.m. Monday - Friday. Please note that voicemails left after 4:00 p.m. may not be returned until the following business day.  We are closed weekends and major holidays. You have access to a nurse at all times for urgent questions. Please call the main number to the clinic Dept: [phone] and follow the prompts.   For any non-urgent questions, you may also contact your provider using MyChart. We now offer e-Visits for anyone 18 and older to request care online for non-urgent symptoms. For details visit [email or web address].   Also download the MyChart app! Go to the app store, search "MyChart", open the app, select , and log in with your MyChart username and password.  Masks are optional in the cancer centers. If you would like for your care team to wear a mask while they are taking care of you, please let them know. For doctor visits, patients may have with them one support person who is at least 68 years old. At this time, visitors are not allowed in the infusion area. Panitumumab Solution for Injection What is this medication? PANITUMUMAB (pan i TOOM ue mab) is a monoclonal antibody. It is used to treat colorectal cancer. This medicine may be used for other purposes; ask your health care provider or pharmacist if you have questions. COMMON BRAND NAME(S): Vectibix What should I   tell my care team before I take this medication? They need to know if you have any of these conditions: eye disease, vision problems low levels of calcium, magnesium, or potassium in the blood lung or breathing disease, like asthma skin conditions or sensitivity an unusual or allergic reaction to panitumumab, other medicines, foods,  dyes, or preservatives pregnant or trying to get pregnant breast-feeding How should I use this medication? This drug is given as an infusion into a vein. It is administered in a hospital or clinic by a specially trained health care professional. Talk to your pediatrician regarding the use of this medicine in children. Special care may be needed. Overdosage: If you think you have taken too much of this medicine contact a poison control center or emergency room at once. NOTE: This medicine is only for you. Do not share this medicine with others. What if I miss a dose? It is important not to miss your dose. Call your doctor or health care professional if you are unable to keep an appointment. What may interact with this medication? Do not take this medicine with any of the following medications: bevacizumab This list may not describe all possible interactions. Give your health care provider a list of all the medicines, herbs, non-prescription drugs, or dietary supplements you use. Also tell them if you smoke, drink alcohol, or use illegal drugs. Some items may interact with your medicine. What should I watch for while using this medication? Visit your doctor for checks on your progress. This drug may make you feel generally unwell. This is not uncommon, as chemotherapy can affect healthy cells as well as cancer cells. Report any side effects. Continue your course of treatment even though you feel ill unless your doctor tells you to stop. This medicine can make you more sensitive to the sun. Keep out of the sun while receiving this medicine and for 2 months after the last dose. If you cannot avoid being in the sun, wear protective clothing and use sunscreen. Do not use sun lamps or tanning beds/booths. In some cases, you may be given additional medicines to help with side effects. Follow all directions for their use. Call your doctor or health care professional for advice if you get a fever, chills or  sore throat, or other symptoms of a cold or flu. Do not treat yourself. This drug decreases your body's ability to fight infections. Try to avoid being around people who are sick. Avoid taking products that contain aspirin, acetaminophen, ibuprofen, naproxen, or ketoprofen unless instructed by your doctor. These medicines may hide a fever. Do not become pregnant while taking this medicine and for 2 months after the last dose. Women should inform their doctor if they wish to become pregnant or think they might be pregnant. There is a potential for serious side effects to an unborn child. Talk to your health care professional or pharmacist for more information. Do not breast-feed an infant while taking this medicine or for 2 months after the last dose. What side effects may I notice from receiving this medication? Side effects that you should report to your doctor or health care professional as soon as possible: allergic reactions like skin rash, itching or hives, swelling of the face, lips, or tongue breathing problems changes in vision eye pain fast, irregular heartbeat fever, chills mouth sores red spots on the skin redness, blistering, peeling or loosening of the skin, including inside the mouth signs and symptoms of kidney injury like trouble passing urine or   change in the amount of urine signs and symptoms of low blood pressure like dizziness; feeling faint or lightheaded, falls; unusually weak or tired signs of low calcium like fast heartbeat, muscle cramps or muscle pain; pain, tingling, numbness in the hands or feet; seizures signs and symptoms of low magnesium like muscle cramps, pain, or weakness; tremors; seizures; or fast, irregular heartbeat signs and symptoms of low potassium like muscle cramps or muscle pain; chest pain; dizziness; feeling faint or lightheaded, falls; palpitations; breathing problems; or fast, irregular heartbeat swelling of the ankles, feet, hands Side effects that  usually do not require medical attention (report to your doctor or health care professional if they continue or are bothersome): changes in skin like acne, cracks, skin dryness diarrhea eyelash growth headache mouth sores nail changes nausea, vomiting This list may not describe all possible side effects. Call your doctor for medical advice about side effects. You may report side effects to FDA at 1-800-FDA-1088. Where should I keep my medication? This drug is given in a hospital or clinic and will not be stored at home. NOTE: This sheet is a summary. It may not cover all possible information. If you have questions about this medicine, talk to your doctor, pharmacist, or health care provider.  2023 Elsevier/Gold Standard (2015-09-20 00:00:00) Hypomagnesemia Hypomagnesemia is a condition in which the level of magnesium in the blood is too low. Magnesium is a mineral that is found in many foods. It is used in many different processes in the body. Hypomagnesemia can affect every organ in the body. In severe cases, it can cause life-threatening problems. What are the causes? This condition may be caused by: Not getting enough magnesium in your diet or not having enough healthy foods to eat (malnutrition). Problems with magnesium absorption in the intestines. Dehydration. Excessive use of alcohol. Vomiting. Severe or long-term (chronic) diarrhea. Some medicines, including medicines that make you urinate more often (diuretics). Certain diseases, such as kidney disease, diabetes, celiac disease, and overactive thyroid. What are the signs or symptoms? Symptoms of this condition include: Loss of appetite, nausea, and vomiting. Involuntary shaking or trembling of a body part (tremor). Muscle weakness or tingling in the arms and legs. Sudden tightening of muscles (muscle spasms). Confusion. Psychiatric issues, such as: Depression and irritability. Psychosis. A feeling of fluttering of the  heart (palpitations). Seizures. These symptoms are more severe if magnesium levels drop suddenly. How is this diagnosed? This condition may be diagnosed based on: Your symptoms and medical history. A physical exam. Blood and urine tests. How is this treated? Treatment depends on the cause and the severity of the condition. It may be treated by: Taking a magnesium supplement. This can be taken in pill form. If the condition is severe, magnesium is usually given through an IV. Making changes to your diet. You may be directed to eat foods that have a lot of magnesium, such as green leafy vegetables, peas, beans, and nuts. Not drinking alcohol. If you are struggling not to drink, ask your health care provider for help. Follow these instructions at home: Eating and drinking     Make sure that your diet includes foods with magnesium. Foods that have a lot of magnesium in them include: Green leafy vegetables, such as spinach and broccoli. Beans and peas. Nuts and seeds, such as almonds and sunflower seeds. Whole grains, such as whole grain bread and fortified cereals. Drink fluids that contain salts and minerals (electrolytes), such as sports drinks, when you are active. Do   not drink alcohol. General instructions Take over-the-counter and prescription medicines only as told by your health care provider. Take magnesium supplements as directed if your health care provider tells you to take them. Have your magnesium levels monitored as told by your health care provider. Keep all follow-up visits. This is important. Contact a health care provider if: You get worse instead of better. Your symptoms return. Get help right away if: You develop severe muscle weakness. You have trouble breathing. You feel that your heart is racing. These symptoms may represent a serious problem that is an emergency. Do not wait to see if the symptoms will go away. Get medical help right away. Call your local  emergency services (911 in the [place] Do not drive yourself to the hospital. Summary Hypomagnesemia is a condition in which the level of magnesium in the blood is too low. Hypomagnesemia can affect every organ in the body. Treatment may include eating more foods that contain magnesium, taking magnesium supplements, and not drinking alcohol. Have your magnesium levels monitored as told by your health care provider. This information is not intended to replace advice given to you by your health care provider. Make sure you discuss any questions you have with your health care provider. Document Revised: 07/23/2020 Document Reviewed: 07/23/2020 Elsevier Patient Education  2023 Elsevier Inc. 

## 2021-08-22 NOTE — Assessment & Plan Note (Addendum)
Lindsay [name] is here today for follow-up of her metastatic colon cancer prior to receiving her every 3-week panitumumab infusion.  She has no clinical signs of colon cancer progression.  She will proceed with this today as she is tolerating it well and her labs are all stable and within parameters.  She will continue on capecitabine 1500 mg p.o. twice daily 1 week on and 1 week off.  She verbalized understanding of the above and understands that she will return in 3 weeks for labs, follow-up with Dr. Burr Medico, and her next infusion.

## 2021-08-26 ENCOUNTER — Other Ambulatory Visit: Payer: Self-pay | Admitting: Hematology

## 2021-08-26 DIAGNOSIS — C221 Intrahepatic bile duct carcinoma: Secondary | ICD-10-CM

## 2021-09-10 ENCOUNTER — Other Ambulatory Visit: Payer: Self-pay | Admitting: Hematology

## 2021-09-10 DIAGNOSIS — C189 Malignant neoplasm of colon, unspecified: Secondary | ICD-10-CM

## 2021-09-11 ENCOUNTER — Inpatient Hospital Stay: Payer: Medicare (Managed Care) | Attending: Hematology

## 2021-09-11 ENCOUNTER — Encounter: Payer: Self-pay | Admitting: Hematology

## 2021-09-11 ENCOUNTER — Other Ambulatory Visit: Payer: Self-pay

## 2021-09-11 ENCOUNTER — Inpatient Hospital Stay: Payer: Medicare (Managed Care)

## 2021-09-11 ENCOUNTER — Inpatient Hospital Stay (HOSPITAL_BASED_OUTPATIENT_CLINIC_OR_DEPARTMENT_OTHER): Payer: Medicare (Managed Care) | Admitting: Hematology

## 2021-09-11 VITALS — BP 121/76 | HR 78 | Temp 97.9°F | Resp 18 | Ht 66.0 in | Wt 182.8 lb

## 2021-09-11 DIAGNOSIS — G620 Drug-induced polyneuropathy: Secondary | ICD-10-CM | POA: Diagnosis not present

## 2021-09-11 DIAGNOSIS — Z87891 Personal history of nicotine dependence: Secondary | ICD-10-CM | POA: Diagnosis not present

## 2021-09-11 DIAGNOSIS — Z86010 Personal history of colonic polyps: Secondary | ICD-10-CM | POA: Diagnosis not present

## 2021-09-11 DIAGNOSIS — C19 Malignant neoplasm of rectosigmoid junction: Secondary | ICD-10-CM | POA: Insufficient documentation

## 2021-09-11 DIAGNOSIS — C787 Secondary malignant neoplasm of liver and intrahepatic bile duct: Secondary | ICD-10-CM | POA: Insufficient documentation

## 2021-09-11 DIAGNOSIS — C778 Secondary and unspecified malignant neoplasm of lymph nodes of multiple regions: Secondary | ICD-10-CM | POA: Insufficient documentation

## 2021-09-11 DIAGNOSIS — I10 Essential (primary) hypertension: Secondary | ICD-10-CM | POA: Diagnosis not present

## 2021-09-11 DIAGNOSIS — Z95828 Presence of other vascular implants and grafts: Secondary | ICD-10-CM

## 2021-09-11 DIAGNOSIS — E1140 Type 2 diabetes mellitus with diabetic neuropathy, unspecified: Secondary | ICD-10-CM | POA: Insufficient documentation

## 2021-09-11 DIAGNOSIS — R011 Cardiac murmur, unspecified: Secondary | ICD-10-CM | POA: Insufficient documentation

## 2021-09-11 DIAGNOSIS — Z7984 Long term (current) use of oral hypoglycemic drugs: Secondary | ICD-10-CM | POA: Insufficient documentation

## 2021-09-11 DIAGNOSIS — C221 Intrahepatic bile duct carcinoma: Secondary | ICD-10-CM | POA: Diagnosis not present

## 2021-09-11 DIAGNOSIS — C7802 Secondary malignant neoplasm of left lung: Secondary | ICD-10-CM | POA: Diagnosis not present

## 2021-09-11 DIAGNOSIS — T451X5D Adverse effect of antineoplastic and immunosuppressive drugs, subsequent encounter: Secondary | ICD-10-CM | POA: Insufficient documentation

## 2021-09-11 DIAGNOSIS — Z7189 Other specified counseling: Secondary | ICD-10-CM

## 2021-09-11 DIAGNOSIS — C189 Malignant neoplasm of colon, unspecified: Secondary | ICD-10-CM

## 2021-09-11 DIAGNOSIS — Z5112 Encounter for antineoplastic immunotherapy: Secondary | ICD-10-CM | POA: Diagnosis not present

## 2021-09-11 LAB — CBC WITH DIFFERENTIAL (CANCER CENTER ONLY)
Abs Immature Granulocytes: 0.01 10*3/uL (ref 0.00–0.07)
Basophils Absolute: 0 10*3/uL (ref 0.0–0.1)
Basophils Relative: 0 %
Eosinophils Absolute: 0.1 10*3/uL (ref 0.0–0.5)
Eosinophils Relative: 1 %
HCT: 34.8 % — ABNORMAL LOW (ref 36.0–46.0)
Hemoglobin: 12 g/dL (ref 12.0–15.0)
Immature Granulocytes: 0 %
Lymphocytes Relative: 23 %
Lymphs Abs: 1.2 10*3/uL (ref 0.7–4.0)
MCH: 33.1 pg (ref 26.0–34.0)
MCHC: 34.5 g/dL (ref 30.0–36.0)
MCV: 95.9 fL (ref 80.0–100.0)
Monocytes Absolute: 0.4 10*3/uL (ref 0.1–1.0)
Monocytes Relative: 9 %
Neutro Abs: 3.4 10*3/uL (ref 1.7–7.7)
Neutrophils Relative %: 67 %
Platelet Count: 219 10*3/uL (ref 150–400)
RBC: 3.63 MIL/uL — ABNORMAL LOW (ref 3.87–5.11)
RDW: 15.3 % (ref 11.5–15.5)
WBC Count: 5.1 10*3/uL (ref 4.0–10.5)
nRBC: 0 % (ref 0.0–0.2)

## 2021-09-11 LAB — CMP (CANCER CENTER ONLY)
ALT: 28 U/L (ref 0–44)
AST: 25 U/L (ref 15–41)
Albumin: 4.3 g/dL (ref 3.5–5.0)
Alkaline Phosphatase: 84 U/L (ref 38–126)
Anion gap: 4 — ABNORMAL LOW (ref 5–15)
BUN: 12 mg/dL (ref 8–23)
CO2: 32 mmol/L (ref 22–32)
Calcium: 9.7 mg/dL (ref 8.9–10.3)
Chloride: 103 mmol/L (ref 98–111)
Creatinine: 0.58 mg/dL (ref 0.44–1.00)
GFR, Estimated: >60 mL/min (ref >60)
Glucose, Bld: 105 mg/dL — ABNORMAL HIGH (ref 70–99)
Potassium: 3.6 mmol/L (ref 3.5–5.1)
Sodium: 139 mmol/L (ref 135–145)
Total Bilirubin: 0.4 mg/dL (ref 0.3–1.2)
Total Protein: 7.6 g/dL (ref 6.5–8.1)

## 2021-09-11 LAB — CEA (IN HOUSE-CHCC): CEA (CHCC-In House): <1 ng/mL (ref 0.00–5.00)

## 2021-09-11 LAB — MAGNESIUM: Magnesium: 1.6 mg/dL — ABNORMAL LOW (ref 1.7–2.4)

## 2021-09-11 MED ORDER — SODIUM CHLORIDE 0.9% FLUSH
10.0000 mL | INTRAVENOUS | Status: DC | PRN
  Administered 2021-09-11: 10 mL

## 2021-09-11 MED ORDER — HEPARIN SOD (PORK) LOCK FLUSH 100 UNIT/ML IV SOLN
500.0000 [IU] | Freq: Once | INTRAVENOUS | Status: AC
  Administered 2021-09-11: 500 [IU]

## 2021-09-11 MED ORDER — SODIUM CHLORIDE 0.9 % IV SOLN: Freq: Once | INTRAVENOUS | Status: AC

## 2021-09-11 MED ORDER — HEPARIN SOD (PORK) LOCK FLUSH 100 UNIT/ML IV SOLN
500.0000 [IU] | Freq: Once | INTRAVENOUS | Status: AC | PRN
  Administered 2021-09-11: 500 [IU]

## 2021-09-11 MED ORDER — MAGNESIUM SULFATE 4 GM/100ML IV SOLN
4.0000 g | Freq: Once | INTRAVENOUS | Status: AC
  Administered 2021-09-11: 4 g via INTRAVENOUS
  Filled 2021-09-11: qty 100

## 2021-09-11 MED ORDER — SODIUM CHLORIDE 0.9% FLUSH
10.0000 mL | Freq: Once | INTRAVENOUS | Status: AC
  Administered 2021-09-11: 10 mL

## 2021-09-11 MED ORDER — SODIUM CHLORIDE 0.9 % IV SOLN
6.0000 mg/kg | Freq: Once | INTRAVENOUS | Status: AC
  Administered 2021-09-11: 500 mg via INTRAVENOUS
  Filled 2021-09-11: qty 20

## 2021-09-11 NOTE — Patient Instructions (Signed)
[facility]  Discharge Instructions: Thank you for choosing [facility] to provide your oncology and hematology care.   If you have a lab appointment with the [facility], please go directly to the [facility] and check in at the registration area.   Wear comfortable clothing and clothing appropriate for easy access to any Portacath or PICC line.   We strive to give you quality time with your provider. You may need to reschedule your appointment if you arrive late (15 or more minutes).  Arriving late affects you and other patients whose appointments are after yours.  Also, if you miss three or more appointments without notifying the office, you may be dismissed from the clinic at the provider's discretion.      For prescription refill requests, have your pharmacy contact our office and allow 72 hours for refills to be completed.    Today you received the following chemotherapy and/or immunotherapy agents: Vectibix.      To help prevent nausea and vomiting after your treatment, we encourage you to take your nausea medication as directed.  BELOW ARE SYMPTOMS THAT SHOULD BE REPORTED IMMEDIATELY: *FEVER GREATER THAN 100.4 F (38 C) OR HIGHER *CHILLS OR SWEATING *NAUSEA AND VOMITING THAT IS NOT CONTROLLED WITH YOUR NAUSEA MEDICATION *UNUSUAL SHORTNESS OF BREATH *UNUSUAL BRUISING OR BLEEDING *URINARY PROBLEMS (pain or burning when urinating, or frequent urination) *BOWEL PROBLEMS (unusual diarrhea, constipation, pain near the anus) TENDERNESS IN MOUTH AND THROAT WITH OR WITHOUT PRESENCE OF ULCERS (sore throat, sores in mouth, or a toothache) UNUSUAL RASH, SWELLING OR PAIN  UNUSUAL VAGINAL DISCHARGE OR ITCHING   Items with * indicate a potential emergency and should be followed up as soon as possible or go to the Emergency Department if any problems should occur.  Please show the CHEMOTHERAPY ALERT CARD or IMMUNOTHERAPY ALERT CARD at check-in to  the Emergency Department and triage nurse.  Should you have questions after your visit or need to cancel or reschedule your appointment, please contact [facility]  Dept: [phone]  and follow the prompts.  Office hours are 8:00 a.m. to 4:30 p.m. Monday - Friday. Please note that voicemails left after 4:00 p.m. may not be returned until the following business day.  We are closed weekends and major holidays. You have access to a nurse at all times for urgent questions. Please call the main number to the clinic Dept: [phone] and follow the prompts.   For any non-urgent questions, you may also contact your provider using MyChart. We now offer e-Visits for anyone 87 and older to request care online for non-urgent symptoms. For details visit [email or web address].   Also download the MyChart app! Go to the app store, search "MyChart", open the app, select [facility], and log in with your MyChart username and password.  Masks are optional in the cancer centers. If you would like for your care team to wear a mask while they are taking care of you, please let them know. For doctor visits, patients may have with them one support person who is at least 68 years old. At this time, visitors are not allowed in the infusion area. Panitumumab Solution for Injection What is this medication? PANITUMUMAB (pan i TOOM ue mab) is a monoclonal antibody. It is used to treat colorectal cancer. This medicine may be used for other purposes; ask your health care provider or pharmacist if you have questions. COMMON BRAND NAME(S): Vectibix What should I  tell my care team before I take this medication? They need to know if you have any of these conditions: eye disease, vision problems low levels of calcium, magnesium, or potassium in the blood lung or breathing disease, like asthma skin conditions or sensitivity an unusual or allergic reaction to panitumumab, other medicines, foods,  dyes, or preservatives pregnant or trying to get pregnant breast-feeding How should I use this medication? This drug is given as an infusion into a vein. It is administered in a hospital or clinic by a specially trained health care professional. Talk to your pediatrician regarding the use of this medicine in children. Special care may be needed. Overdosage: If you think you have taken too much of this medicine contact a poison control center or emergency room at once. NOTE: This medicine is only for you. Do not share this medicine with others. What if I miss a dose? It is important not to miss your dose. Call your doctor or health care professional if you are unable to keep an appointment. What may interact with this medication? Do not take this medicine with any of the following medications: bevacizumab This list may not describe all possible interactions. Give your health care provider a list of all the medicines, herbs, non-prescription drugs, or dietary supplements you use. Also tell them if you smoke, drink alcohol, or use illegal drugs. Some items may interact with your medicine. What should I watch for while using this medication? Visit your doctor for checks on your progress. This drug may make you feel generally unwell. This is not uncommon, as chemotherapy can affect healthy cells as well as cancer cells. Report any side effects. Continue your course of treatment even though you feel ill unless your doctor tells you to stop. This medicine can make you more sensitive to the sun. Keep out of the sun while receiving this medicine and for 2 months after the last dose. If you cannot avoid being in the sun, wear protective clothing and use sunscreen. Do not use sun lamps or tanning beds/booths. In some cases, you may be given additional medicines to help with side effects. Follow all directions for their use. Call your doctor or health care professional for advice if you get a fever, chills or  sore throat, or other symptoms of a cold or flu. Do not treat yourself. This drug decreases your body's ability to fight infections. Try to avoid being around people who are sick. Avoid taking products that contain aspirin, acetaminophen, ibuprofen, naproxen, or ketoprofen unless instructed by your doctor. These medicines may hide a fever. Do not become pregnant while taking this medicine and for 2 months after the last dose. Women should inform their doctor if they wish to become pregnant or think they might be pregnant. There is a potential for serious side effects to an unborn child. Talk to your health care professional or pharmacist for more information. Do not breast-feed an infant while taking this medicine or for 2 months after the last dose. What side effects may I notice from receiving this medication? Side effects that you should report to your doctor or health care professional as soon as possible: allergic reactions like skin rash, itching or hives, swelling of the face, lips, or tongue breathing problems changes in vision eye pain fast, irregular heartbeat fever, chills mouth sores red spots on the skin redness, blistering, peeling or loosening of the skin, including inside the mouth signs and symptoms of kidney injury like trouble passing urine or  change in the amount of urine signs and symptoms of low blood pressure like dizziness; feeling faint or lightheaded, falls; unusually weak or tired signs of low calcium like fast heartbeat, muscle cramps or muscle pain; pain, tingling, numbness in the hands or feet; seizures signs and symptoms of low magnesium like muscle cramps, pain, or weakness; tremors; seizures; or fast, irregular heartbeat signs and symptoms of low potassium like muscle cramps or muscle pain; chest pain; dizziness; feeling faint or lightheaded, falls; palpitations; breathing problems; or fast, irregular heartbeat swelling of the ankles, feet, hands Side effects that  usually do not require medical attention (report to your doctor or health care professional if they continue or are bothersome): changes in skin like acne, cracks, skin dryness diarrhea eyelash growth headache mouth sores nail changes nausea, vomiting This list may not describe all possible side effects. Call your doctor for medical advice about side effects. You may report side effects to FDA at 1-800-FDA-1088. Where should I keep my medication? This drug is given in a hospital or clinic and will not be stored at home. NOTE: This sheet is a summary. It may not cover all possible information. If you have questions about this medicine, talk to your doctor, pharmacist, or health care provider.  2023 Elsevier/Gold Standard (2015-09-20 00:00:00) Hypomagnesemia Hypomagnesemia is a condition in which the level of magnesium in the blood is too low. Magnesium is a mineral that is found in many foods. It is used in many different processes in the body. Hypomagnesemia can affect every organ in the body. In severe cases, it can cause life-threatening problems. What are the causes? This condition may be caused by: Not getting enough magnesium in your diet or not having enough healthy foods to eat (malnutrition). Problems with magnesium absorption in the intestines. Dehydration. Excessive use of alcohol. Vomiting. Severe or long-term (chronic) diarrhea. Some medicines, including medicines that make you urinate more often (diuretics). Certain diseases, such as kidney disease, diabetes, celiac disease, and overactive thyroid. What are the signs or symptoms? Symptoms of this condition include: Loss of appetite, nausea, and vomiting. Involuntary shaking or trembling of a body part (tremor). Muscle weakness or tingling in the arms and legs. Sudden tightening of muscles (muscle spasms). Confusion. Psychiatric issues, such as: Depression and irritability. Psychosis. A feeling of fluttering of the  heart (palpitations). Seizures. These symptoms are more severe if magnesium levels drop suddenly. How is this diagnosed? This condition may be diagnosed based on: Your symptoms and medical history. A physical exam. Blood and urine tests. How is this treated? Treatment depends on the cause and the severity of the condition. It may be treated by: Taking a magnesium supplement. This can be taken in pill form. If the condition is severe, magnesium is usually given through an IV. Making changes to your diet. You may be directed to eat foods that have a lot of magnesium, such as green leafy vegetables, peas, beans, and nuts. Not drinking alcohol. If you are struggling not to drink, ask your health care provider for help. Follow these instructions at home: Eating and drinking     Make sure that your diet includes foods with magnesium. Foods that have a lot of magnesium in them include: Green leafy vegetables, such as spinach and broccoli. Beans and peas. Nuts and seeds, such as almonds and sunflower seeds. Whole grains, such as whole grain bread and fortified cereals. Drink fluids that contain salts and minerals (electrolytes), such as sports drinks, when you are active. Do  not drink alcohol. General instructions Take over-the-counter and prescription medicines only as told by your health care provider. Take magnesium supplements as directed if your health care provider tells you to take them. Have your magnesium levels monitored as told by your health care provider. Keep all follow-up visits. This is important. Contact a health care provider if: You get worse instead of better. Your symptoms return. Get help right away if: You develop severe muscle weakness. You have trouble breathing. You feel that your heart is racing. These symptoms may represent a serious problem that is an emergency. Do not wait to see if the symptoms will go away. Get medical help right away. Call your local  emergency services (911 in the [place] Do not drive yourself to the hospital. Summary Hypomagnesemia is a condition in which the level of magnesium in the blood is too low. Hypomagnesemia can affect every organ in the body. Treatment may include eating more foods that contain magnesium, taking magnesium supplements, and not drinking alcohol. Have your magnesium levels monitored as told by your health care provider. This information is not intended to replace advice given to you by your health care provider. Make sure you discuss any questions you have with your health care provider. Document Revised: 07/23/2020 Document Reviewed: 07/23/2020 Elsevier Patient Education  [employer].

## 2021-09-11 NOTE — Progress Notes (Signed)
[place]   [phone] Fax:([phone]   Clinic Follow up Note   Patient Care Team: [name], MD as PCP - General (Internal Medicine) [name], RN (Inactive) as Oncology Nurse Navigator [name], MD as Consulting Physician (Oncology) [name], MD as Consulting Physician (Gastroenterology)  Date of Service:  09/11/2021  CHIEF COMPLAINT: f/u of metastatic colon cancer  CURRENT THERAPY:  -Xeloda started 01/25/20, currently on 1500 mg BID days 1-7 q14d -Vectibix, q3weeks, started 02/06/20  ASSESSMENT & PLAN:  Lindsay [name] is a 66 y.o. female with   1. 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 MSS, [name]/[name] wildtype -Her 12/15/19 MRI abdomen showed 2 large liver masses and bulky porta hepatis and retroperitoneal adenopathy. -Her EUS from 12/22/19 showed 3.5cm LN in porta hepatis region and biopsy confirmed metastatic adenocarcinoma, insufficient tissue for IHC study but morphology does not support HCC. -Liver biopsy 01/08/20 confirmed adenocarcinoma, immunostain supporting primary colorectal cancer -PET scan showed: known liver metastasis; diffuse thoracic and abdominal adenopathy; hypermetabolic 1.8 cm pulmonary nodule in LUL; focal hypermetabolic lesion in the splenic fixture of the colon. -She began first line systemic FOLFOX on 01/25/20, goal is palliative. Scans show positive response to treatment. -FO shows [name]/[name] wild-type, she is a candidate for EGFR inhibitor Panitumumab which was added with C2 -Oxaliplatin was stopped after cycle 18 due to reaction.  She also had vasovagal-like reaction after Vectibix, before 5-FU on 08/02/20 but no issues on 10/02/20. Due to skin cracking and concern for 5-FU infusion reactions she was switched to Xeloda.   -She has developed significant hand-foot syndrome and Xeloda was reduced to 1500 mg BID 2 weeks on/1 week off on 01/20/21. She continued to have skin toxicity, so we switched her to  7 days on/7 days off.  -restaging CT CAP on 06/20/21 showed overall stable disease. -she is tolerating Xeloda 1500 mg BID 7 days on/7 days off well overall. She has stable skin dryness and darkening, as to be expected; she continues to manage well.  She is tolerating Vectibix very well except hypomagnesemia, no significant skin rash. -labs reviewed, overall WNL and adequate for Vectibix today  -plan for restaging scan in 10/2021; I ordered today.   2. Chemo Toxicities: Skin and nail changes, hand-foot syndrome, hypomagnesia, acid reflux -secondary to 5-FU and worse on Xeloda -tolerating better on 1500 mg 7 days on/7 days off -she is currently taking magnesium 800 mg QID. -she has acid reflux symptoms and uses tums and pepcid. She knows not to take pepcid at the same time as the Xeloda.   3. Grade 1 neuropathy  -S/p cycle 5, oxaliplatin dose reduced and eventually discontinued after reaction 09/18/20 -Persistent numbness at the feet and hands, mild   4. HTN, DM, Heart Murmur, Arthritis -Per PCP and Cardiologist Dr Einar Gip -DM controlled, no pre-existing neuropathy   5. Financial and Social Support -She has G[employer]. She is retired. -She lives with husband and has brothers and sister in and out of town. Her only child passed in recent years.   6. Goals of care -we discussed the incurable but treatable nature of her stage IV cancer and general prognosis. She understands the prognosis is poor if she does not tolerate or respond well to chemo -she understands the goal is palliative -full code for now      PLAN: -proceed with Vectibix today and continue every 3 weeks -continue Xeloda at 1592m q12h, 7 days on and 7 days off -lab,  f/u, and Vectibix every 3 weeks -restaging CT to be done in 5-6 weeks   No problem-specific Assessment & Plan notes found for this encounter.   SUMMARY OF ONCOLOGIC HISTORY: Oncology History Overview Note  Cancer Staging No matching staging information  was found for the patient.    metastatic colon cancer to liver  06/20/2019 Procedure   Colonoscopy by Dr [name] 06/20/19  IMPRESSION -Seven 3 to 10 mm polyps in the sigmoid colon, in the transverse colon and in the escending colon, removed with a cold snare. Resected and retrireved.  -One 44m polyp in the descending colon. Biopsies. Tattoes.  -Mediaum sized lipoma in the ascending colon.   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 See Comment B. Colon, Ascending, polyp, Polypectomy:  -TUBULAR ADENOMA -No high grade dysplasia or malignancy.  C. Colon, TRansverse, Polyo, polectomy:  -TUBULAR ADENOMA -No high grade dysplasia or malignancy.  D. Colon, Sigmoid, Polyp, Polypectomy:  -HYPERPLASTIC POLYP   11/07/2019 Imaging   [place]dom[place] 11/07/19  IMPRESSION: 1. Two solid masses in the liver are nonspecific. Recommend MRI abdomen with and without contrast for further evaluation.   12/15/2019 Imaging   MRI Abdomen 12/15/19  IMPRESSION: 1. There are two large masses in the liver with appearance favoring metastatic disease or hepatocellular carcinoma or cholangiocarcinoma. A benign etiology is highly unlikely given the enhancement pattern and associated adenopathy. 2. Considerable porta hepatis and retroperitoneal adenopathy. Some of the confluent porta hepatis tumor is potentially infiltrative and abuts the pancreatic body along its right upper margin, making it difficult to completely exclude the possibility of pancreatic adenocarcinoma primary. Possibilities helpful in further workup might include tissue diagnosis, endoscopic ultrasound, or nuclear medicine PET-CT. 3. Pancreas divisum. 4. Lumbar spondylosis and degenerative disc disease. 5. Despite efforts by the technologist and patient, motion artifact is present on today's exam and could not be eliminated. This reduces exam sensitivity and  specificity.   12/22/2019 Procedure   Upper Endoscopy by Dr [name]10/15/21  IMPRESSION - One lymph node was visualized and measured in the porta hepatis region. Fine needle aspiration performed    12/22/2019 Initial Biopsy   A. LIVER, PORTA HEPATIS MASS, FINE NEEDLE 12/22/19 ASPIRATION:  FINAL MICROSCOPIC DIAGNOSIS:  - Malignant cells consistent with metastatic adenocarcinoma   01/01/2020 Initial Diagnosis   [place] [place]   01/08/2020 Initial Biopsy   FINAL MICROSCOPIC DIAGNOSIS:   A. LIVER, LEFT LOBE, BIOPSY:  - Metastatic adenocarcinoma, consistent with a colorectal primary.  See  comment      COMMENT:   Immunohistochemical stains show the tumor cells are positive for CK20  and CDX2 but negative for CK7, consistent with above interpretation.  Dr. [name] notified on 01/10/2020   01/08/2020 Genetic Testing   [name]  [name] wildtype and [name]/[name] mutations which make her eligible for target biological agent Vectibix.   01/24/2020 Procedure   PAC placed 01/24/20   01/25/2020 -  Chemotherapy   first line FOLFOX starting 01/25/2020, Vextibix  added with C2 (02/06/20)   06/20/2020 Imaging   CT CAP  IMPRESSION: 1. Continued interval reduction in size and conspicuity of a subsolid nodule of the peripheral left upper lobe. 2. Unchanged prominent pretracheal and subcarinal lymph nodes. 3. Redemonstrated partially calcified low-attenuation liver masses, slightly decreased in size compared to prior examination. 4. Slight interval decrease in size of a portacaval lymph node or conglomerate and retroperitoneal lymph nodes. 5. Findings are consistent with continued treatment  response of nodal, pulmonary, and hepatic metastatic disease. 6. Coronary artery disease.   Aortic Atherosclerosis (ICD10-I70.0).     10/09/2020 Imaging   IMPRESSION: 1. Slight decrease in size of dominant liver mass with smaller liver mass within 1-2 mm of prior  measurement. 2. Stable appearance of celiac lymph and retroperitoneal lymph nodes. Dominant node with calcification in the gastrohepatic ligament as described. 3. Continued decrease in size of LEFT upper lobe nodule. 4. Three-vessel coronary artery calcification. 5. Aortic atherosclerosis.      INTERVAL HISTORY:  Lindsay [name] is here for a follow up of metastatic colon cancer. She was last seen by NP Mendel Ryder on 08/22/21. She presents to the clinic alone. She reports she is doing well overall. She reports stable skin darkening and numbness to her hands and feet. She denies tingling.   All other systems were reviewed with the patient and are negative.  MEDICAL HISTORY:  Past Medical History:  Diagnosis Date   Arthritis    Diabetes mellitus without complication ([place])    Hypertension    met colon ca to liver 01/2020    SURGICAL HISTORY: Past Surgical History:  Procedure Laterality Date   ABDOMINAL HYSTERECTOMY     ANTERIOR AND POSTERIOR REPAIR WITH SACROSPINOUS FIXATION N/A 07/16/2016   Procedure: ANTERIOR AND POSTERIOR REPAIR WITH SACROSPINOUS FIXATION;  Surgeon: Everlene Farrier, MD;  Location: [facility];  Service: Gynecology;  Laterality: N/A;   CYSTOSCOPY  07/16/2016   Procedure: CYSTOSCOPY;  Surgeon: Everlene Farrier, MD;  Location: [facility];  Service: Gynecology;;   ESOPHAGOGASTRODUODENOSCOPY (EGD) WITH PROPOFOL N/A 12/22/2019   Procedure: ESOPHAGOGASTRODUODENOSCOPY (EGD) WITH PROPOFOL;  Surgeon: [name], MD;  Location: [facility];  Service: Endoscopy;  Laterality: N/A;   FINE NEEDLE ASPIRATION N/A 12/22/2019   Procedure: FINE NEEDLE ASPIRATION (FNA) LINEAR;  Surgeon: [name], MD;  Location: [facility];  Service: Endoscopy;  Laterality: N/A;   IR IMAGING GUIDED PORT INSERTION  01/24/2020   LAPAROSCOPIC VAGINAL HYSTERECTOMY WITH SALPINGECTOMY Bilateral 07/16/2016   Procedure: LAPAROSCOPIC ASSISTED VAGINAL HYSTERECTOMY WITH SALPINGECTOMY;  Surgeon: Everlene Farrier, MD;   Location: [facility];  Service: Gynecology;  Laterality: Bilateral;   MYOMECTOMY ABDOMINAL APPROACH     THYROID SURGERY     tyroid     UPPER ESOPHAGEAL ENDOSCOPIC ULTRASOUND (EUS) N/A 12/22/2019   Procedure: UPPER ESOPHAGEAL ENDOSCOPIC ULTRASOUND (EUS);  Surgeon: [name], MD;  Location: [facility];  Service: Endoscopy;  Laterality: N/A;    I have reviewed the social history and family history with the patient and they are unchanged from previous note.  ALLERGIES:  is allergic to oxaliplatin.  MEDICATIONS:  Current Outpatient Medications  Medication Sig Dispense Refill   aspirin EC 81 MG tablet Take 1 tablet (81 mg total) by mouth daily. 90 tablet 3   atorvastatin (LIPITOR) 20 MG tablet Take 20 mg by mouth at bedtime.   1   capecitabine (XELODA) 500 MG tablet TAKE 3 TABLETS BY MOUTH 2 TIMES DAILY AFTER A MEAL. TAKE FOR 7 DAYS, FOLLOWED BY 7 DAYS OFF. REPEAT EVERY 14 DAYS. 84 tablet 1   clindamycin (CLINDAGEL) 1 % gel Apply topically 2 (two) times daily. 60 g 2   clobetasol cream (TEMOVATE) 6.54 % Apply 1 application topically daily.     esomeprazole (NEXIUM) 40 MG capsule Take 40 mg by mouth daily.     hydrochlorothiazide (MICROZIDE) 12.5 MG capsule Take 12.5 mg by mouth daily.     KLOR-CON M20 20 MEQ tablet TAKE 1 TABLET TWICE A DAY  180 tablet 3   lidocaine-prilocaine (EMLA) cream Apply 1 application topically as needed. 30 g 1   magic mouthwash w/lidocaine SOLN Take 5 mLs by mouth 4 (four) times daily. 475 mL 2   magnesium oxide (MAG-OX) 400 (240 Mg) MG tablet Take 2 tablets by mouth 3 (three) times daily.     magnesium oxide (MAG-OX) 400 MG tablet Take 2 tablets (800 mg total) by mouth 3 (three) times daily. 180 tablet 2   omeprazole (PRILOSEC) 20 MG capsule Take 1 capsule (20 mg total) by mouth daily. 30 capsule 2   ondansetron (ZOFRAN) 8 MG tablet TAKE 1 TABLET BY MOUTH EVERY 8 HOURS AS NEEDED FOR NAUSEA OR VOMITING 20 tablet 0   prochlorperazine (COMPAZINE) 10 MG tablet Take 1  tablet (10 mg total) by mouth every 6 (six) hours as needed for nausea or vomiting. 30 tablet 2   spironolactone (ALDACTONE) 25 MG tablet Take 25 mg by mouth daily with breakfast.     urea (CARMOL) 10 % cream Apply topically 2 (two) times daily. 71 g 2   valACYclovir (VALTREX) 1000 MG tablet Take 1 tablet (1,000 mg total) by mouth 2 (two) times daily. 10 tablet 0   verapamil (CALAN) 120 MG tablet Take 120 mg by mouth 3 (three) times daily.     No current facility-administered medications for this visit.    PHYSICAL EXAMINATION: ECOG PERFORMANCE STATUS: 0 - Asymptomatic  Vitals:   09/11/21 1148  BP: 121/76  Pulse: 78  Resp: 18  Temp: 97.9 F (36.6 C)  SpO2: 100%   Wt Readings from Last 3 Encounters:  09/11/21 182 lb 12.8 oz (82.9 kg)  08/22/21 182 lb 14.4 oz (83 kg)  07/31/21 184 lb 9.6 oz (83.7 kg)     GENERAL:alert, no distress and comfortable SKIN: skin color normal, no rashes or significant lesions EYES: normal, Conjunctiva are pink and non-injected, sclera clear  NEURO: alert & oriented x 3 with fluent speech  LABORATORY DATA:  I have reviewed the data as listed    Latest Ref Rng & Units 09/11/2021   11:42 AM 08/22/2021   11:46 AM 07/31/2021    2:06 PM  CBC  WBC 4.0 - 10.5 K/uL 5.1  5.1  6.2   Hemoglobin 12.0 - 15.0 g/dL 12.0  12.1  12.0   Hematocrit 36.0 - 46.0 % 34.8  35.0  34.7   Platelets 150 - 400 K/uL 219  248  232         Latest Ref Rng & Units 09/11/2021   11:42 AM 08/22/2021   11:46 AM 07/31/2021    2:06 PM  CMP  Glucose 70 - 99 mg/dL 105  98  100   BUN 8 - 23 mg/dL 12  13  13    Creatinine 0.44 - 1.00 mg/dL 0.58  0.64  0.74   Sodium 135 - 145 mmol/L 139  138  137   Potassium 3.5 - 5.1 mmol/L 3.6  3.7  3.7   Chloride 98 - 111 mmol/L 103  103  101   CO2 22 - 32 mmol/L 32  29  30   Calcium 8.9 - 10.3 mg/dL 9.7  9.8  9.7   Total Protein 6.5 - 8.1 g/dL 7.6  7.8  7.8   Total Bilirubin 0.3 - 1.2 mg/dL 0.4  0.3  0.4   Alkaline Phos 38 - 126 U/L 84  86  70    AST 15 - 41 U/L 25  25  25  ALT 0 - 44 U/L 28  29  29        RADIOGRAPHIC STUDIES: I have personally reviewed the radiological images as listed and agreed with the findings in the report. No results found.    Orders Placed This Encounter  Procedures   CT CHEST ABDOMEN PELVIS W CONTRAST    Standing Status:   Future    Standing Expiration Date:   09/12/2022    Order Specific Question:   Preferred imaging location?    Answer:   [facility]    Order Specific Question:   Is Oral Contrast requested for this exam?    Answer:   Yes, Per Radiology protocol   All questions were answered. The patient knows to call the clinic with any problems, questions or concerns. No barriers to learning was detected. The total time spent in the appointment was 30 minutes.     [name], MD 09/11/2021   I, Wilburn Mylar, am acting as scribe for [name], MD.   I have reviewed the above documentation for accuracy and completeness, and I agree with the above.

## 2021-09-22 ENCOUNTER — Other Ambulatory Visit: Payer: Self-pay | Admitting: Hematology

## 2021-09-26 ENCOUNTER — Other Ambulatory Visit: Payer: Self-pay

## 2021-09-26 DIAGNOSIS — C221 Intrahepatic bile duct carcinoma: Secondary | ICD-10-CM

## 2021-09-29 ENCOUNTER — Other Ambulatory Visit: Payer: Self-pay

## 2021-10-03 ENCOUNTER — Encounter: Payer: Self-pay | Admitting: Hematology

## 2021-10-03 ENCOUNTER — Inpatient Hospital Stay (HOSPITAL_BASED_OUTPATIENT_CLINIC_OR_DEPARTMENT_OTHER): Payer: Medicare (Managed Care) | Admitting: Hematology

## 2021-10-03 ENCOUNTER — Inpatient Hospital Stay: Payer: Medicare (Managed Care)

## 2021-10-03 ENCOUNTER — Other Ambulatory Visit: Payer: Self-pay

## 2021-10-03 VITALS — BP 120/76 | HR 67 | Temp 98.3°F | Resp 17 | Wt 180.6 lb

## 2021-10-03 DIAGNOSIS — C189 Malignant neoplasm of colon, unspecified: Secondary | ICD-10-CM

## 2021-10-03 DIAGNOSIS — C221 Intrahepatic bile duct carcinoma: Secondary | ICD-10-CM

## 2021-10-03 DIAGNOSIS — Z95828 Presence of other vascular implants and grafts: Secondary | ICD-10-CM

## 2021-10-03 DIAGNOSIS — Z7189 Other specified counseling: Secondary | ICD-10-CM

## 2021-10-03 DIAGNOSIS — Z5112 Encounter for antineoplastic immunotherapy: Secondary | ICD-10-CM | POA: Diagnosis not present

## 2021-10-03 LAB — CBC WITH DIFFERENTIAL (CANCER CENTER ONLY)
Abs Immature Granulocytes: 0 10*3/uL (ref 0.00–0.07)
Basophils Absolute: 0 10*3/uL (ref 0.0–0.1)
Basophils Relative: 0 %
Eosinophils Absolute: 0.1 10*3/uL (ref 0.0–0.5)
Eosinophils Relative: 1 %
HCT: 35.6 % — ABNORMAL LOW (ref 36.0–46.0)
Hemoglobin: 12.2 g/dL (ref 12.0–15.0)
Immature Granulocytes: 0 %
Lymphocytes Relative: 23 %
Lymphs Abs: 1.2 10*3/uL (ref 0.7–4.0)
MCH: 32.8 pg (ref 26.0–34.0)
MCHC: 34.3 g/dL (ref 30.0–36.0)
MCV: 95.7 fL (ref 80.0–100.0)
Monocytes Absolute: 0.5 10*3/uL (ref 0.1–1.0)
Monocytes Relative: 9 %
Neutro Abs: 3.3 10*3/uL (ref 1.7–7.7)
Neutrophils Relative %: 67 %
Platelet Count: 231 10*3/uL (ref 150–400)
RBC: 3.72 MIL/uL — ABNORMAL LOW (ref 3.87–5.11)
RDW: 15.7 % — ABNORMAL HIGH (ref 11.5–15.5)
WBC Count: 5 10*3/uL (ref 4.0–10.5)
nRBC: 0 % (ref 0.0–0.2)

## 2021-10-03 LAB — CMP (CANCER CENTER ONLY)
ALT: 37 U/L (ref 0–44)
AST: 30 U/L (ref 15–41)
Albumin: 4.3 g/dL (ref 3.5–5.0)
Alkaline Phosphatase: 88 U/L (ref 38–126)
Anion gap: 5 (ref 5–15)
BUN: 13 mg/dL (ref 8–23)
CO2: 31 mmol/L (ref 22–32)
Calcium: 9.6 mg/dL (ref 8.9–10.3)
Chloride: 103 mmol/L (ref 98–111)
Creatinine: 0.64 mg/dL (ref 0.44–1.00)
GFR, Estimated: >60 mL/min (ref >60)
Glucose, Bld: 107 mg/dL — ABNORMAL HIGH (ref 70–99)
Potassium: 3.6 mmol/L (ref 3.5–5.1)
Sodium: 139 mmol/L (ref 135–145)
Total Bilirubin: 0.4 mg/dL (ref 0.3–1.2)
Total Protein: 7.8 g/dL (ref 6.5–8.1)

## 2021-10-03 LAB — CEA (IN HOUSE-CHCC): CEA (CHCC-In House): 1.1 ng/mL (ref 0.00–5.00)

## 2021-10-03 LAB — MAGNESIUM: Magnesium: 1.6 mg/dL — ABNORMAL LOW (ref 1.7–2.4)

## 2021-10-03 MED ORDER — SODIUM CHLORIDE 0.9 % IV SOLN: Freq: Once | INTRAVENOUS | Status: AC

## 2021-10-03 MED ORDER — SODIUM CHLORIDE 0.9% FLUSH
10.0000 mL | Freq: Once | INTRAVENOUS | Status: AC
  Administered 2021-10-03: 10 mL

## 2021-10-03 MED ORDER — HEPARIN SOD (PORK) LOCK FLUSH 100 UNIT/ML IV SOLN
500.0000 [IU] | Freq: Once | INTRAVENOUS | Status: AC | PRN
  Administered 2021-10-03: 500 [IU]

## 2021-10-03 MED ORDER — SODIUM CHLORIDE 0.9 % IV SOLN
6.0000 mg/kg | Freq: Once | INTRAVENOUS | Status: AC
  Administered 2021-10-03: 500 mg via INTRAVENOUS
  Filled 2021-10-03: qty 20

## 2021-10-03 MED ORDER — SODIUM CHLORIDE 0.9% FLUSH
10.0000 mL | INTRAVENOUS | Status: DC | PRN
  Administered 2021-10-03: 10 mL

## 2021-10-03 MED ORDER — MAGNESIUM SULFATE 4 GM/100ML IV SOLN
4.0000 g | Freq: Once | INTRAVENOUS | Status: AC
  Administered 2021-10-03: 4 g via INTRAVENOUS
  Filled 2021-10-03: qty 100

## 2021-10-03 NOTE — Progress Notes (Signed)
[facility]   [phone] [phone]   Clinic Follow up Note   Patient Care Team: Moreira, Roy, MD as PCP - General (Internal Medicine) Anderson, Cheryl L, RN (Inactive) as Oncology Nurse Navigator Feng, Yan, MD as Consulting Physician (Oncology) Hung, Patrick, MD as Consulting Physician (Gastroenterology)  Date of Service:  10/03/2021  CHIEF COMPLAINT: f/u of metastatic colon cancer  CURRENT THERAPY:  -Xeloda started 01/25/20  -current dose: 1500 mg BID days 1-7 q14d -Vectibix, q3weeks, started 02/06/20  ASSESSMENT & PLAN:  Lindsay [name] is a 66 y.o. female with   1. 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 MSS, [name]/[name] wildtype -Her 12/15/19 MRI abdomen showed 2 large liver masses and bulky porta hepatis and retroperitoneal adenopathy. -Her EUS from 12/22/19 showed 3.5cm LN in porta hepatis region and biopsy confirmed metastatic adenocarcinoma, insufficient tissue for IHC study but morphology does not support HCC. -Liver biopsy 01/08/20 confirmed adenocarcinoma, immunostain supporting primary colorectal cancer -PET scan showed: known liver metastasis; diffuse thoracic and abdominal adenopathy; hypermetabolic 1.8 cm pulmonary nodule in LUL; focal hypermetabolic lesion in splenic fixture of colon. -treated with first line FOLFOX 01/25/20 - 10/02/20, Vectibix added with cycle 2. Oxali stopped due to reaction. -Due to skin cracking and concern for 5-FU infusion reactions she was switched to Xeloda. Dose has been adjusted due to significant skin toxicity. -restaging CT CAP on 06/20/21 showed overall stable disease. -she is tolerating Xeloda 1500 mg BID 7 days on/7 days off well overall. She has stable skin dryness and darkening, as to be expected; she continues to manage well.  She is tolerating Vectibix very well except hypomagnesemia, no significant skin rash. -labs reviewed, overall WNL and adequate for Vectibix today. Magnesium is stable  but remains low; I advised her to continue taking supplement. -she is scheduled for restaging scan on 10/13/21.   2. Chemo Toxicities: Skin and nail changes, hand-foot syndrome, hypomagnesia, acid reflux -secondary to 5-FU and worse on Xeloda -tolerating better on 1500 mg 7 days on/7 days off -she is currently taking magnesium 800 mg QID. -she has acid reflux symptoms and uses tums and pepcid. She knows not to take pepcid at the same time as the Xeloda.   3. Grade 1 neuropathy  -S/p cycle 5, oxaliplatin dose reduced and eventually discontinued after reaction 09/18/20 -Persistent numbness at the feet and hands, mild   4. HTN, DM, Heart Murmur, Arthritis -Per PCP and Cardiologist Dr Ganji -DM controlled, no pre-existing neuropathy   5. Financial and Social Support -She has [employer] insurance. She is retired. -She lives with husband and has brothers and sister in and out of town. Her only child passed in recent years.   6. Goals of care -we discussed the incurable but treatable nature of her stage IV cancer and general prognosis. She understands the prognosis is poor if she does not tolerate or respond well to chemo -she understands the goal is palliative -full code for now      PLAN: -proceed with Vectibix today and continue every 3 weeks -continue Xeloda at 1500mg q12h, 7 days on and 7 days off -restaging CT 8/7 -lab, f/u, and Vectibix every 3 weeks   No problem-specific Assessment & Plan notes found for this encounter.   SUMMARY OF ONCOLOGIC HISTORY: Oncology History Overview Note  Cancer Staging No matching staging information was found for the patient.    metastatic colon cancer to liver  06/20/2019 Procedure   Colonoscopy by Dr Mansouraty 06/20/19  IMPRESSION -  Seven 3 to 10 mm polyps in the sigmoid colon, in the transverse colon and in the escending colon, removed with a cold snare. Resected and retrireved.  -One 59m polyp in the descending colon. Biopsies. Tattoes.   -Mediaum sized lipoma in the ascending colon.   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 See Comment B. Colon, Ascending, polyp, Polypectomy:  -TUBULAR ADENOMA -No high grade dysplasia or malignancy.  C. Colon, TRansverse, Polyo, polectomy:  -TUBULAR ADENOMA -No high grade dysplasia or malignancy.  D. Colon, Sigmoid, Polyp, Polypectomy:  -HYPERPLASTIC POLYP   11/07/2019 Imaging   [place]domen 11/07/19  IMPRESSION: 1. Two solid masses in the liver are nonspecific. Recommend MRI abdomen with and without contrast for further evaluation.   12/15/2019 Imaging   MRI Abdomen 12/15/19  IMPRESSION: 1. There are two large masses in the liver with appearance favoring metastatic disease or hepatocellular carcinoma or cholangiocarcinoma. A benign etiology is highly unlikely given the enhancement pattern and associated adenopathy. 2. Considerable porta hepatis and retroperitoneal adenopathy. Some of the confluent porta hepatis tumor is potentially infiltrative and abuts the pancreatic body along its right upper margin, making it difficult to completely exclude the possibility of pancreatic adenocarcinoma primary. Possibilities helpful in further workup might include tissue diagnosis, endoscopic ultrasound, or nuclear medicine PET-CT. 3. Pancreas divisum. 4. Lumbar spondylosis and degenerative disc disease. 5. Despite efforts by the technologist and patient, motion artifact is present on today's exam and could not be eliminated. This reduces exam sensitivity and specificity.   12/22/2019 Procedure   Upper Endoscopy by Dr [name]10/15/21  IMPRESSION - One lymph node was visualized and measured in the porta hepatis region. Fine needle aspiration performed    12/22/2019 Initial Biopsy   A. LIVER, PORTA HEPATIS MASS, FINE NEEDLE 12/22/19 ASPIRATION:  FINAL MICROSCOPIC DIAGNOSIS:  - Malignant cells consistent  with metastatic adenocarcinoma   01/01/2020 Initial Diagnosis   [place]   01/08/2020 Initial Biopsy   FINAL MICROSCOPIC DIAGNOSIS:   A. LIVER, LEFT LOBE, BIOPSY:  - Metastatic adenocarcinoma, consistent with a colorectal primary.  See  comment      COMMENT:   Immunohistochemical stains show the tumor cells are positive for CK20  and CDX2 but negative for CK7, consistent with above interpretation.  Dr. [name] notified on 01/10/2020   01/08/2020 Genetic Testing   [name]  [name] wildtype and [name]/[name] mutations which make her eligible for target biological agent Vectibix.   01/24/2020 Procedure   PAC placed 01/24/20   01/25/2020 -  Chemotherapy   first line FOLFOX starting 01/25/2020, Vextibix  added with C2 (02/06/20)   06/20/2020 Imaging   CT CAP  IMPRESSION: 1. Continued interval reduction in size and conspicuity of a subsolid nodule of the peripheral left upper lobe. 2. Unchanged prominent pretracheal and subcarinal lymph nodes. 3. Redemonstrated partially calcified low-attenuation liver masses, slightly decreased in size compared to prior examination. 4. Slight interval decrease in size of a portacaval lymph node or conglomerate and retroperitoneal lymph nodes. 5. Findings are consistent with continued treatment response of nodal, pulmonary, and hepatic metastatic disease. 6. Coronary artery disease.   Aortic Atherosclerosis (ICD10-I70.0).     10/09/2020 Imaging   IMPRESSION: 1. Slight decrease in size of dominant liver mass with smaller liver mass within 1-2 mm of prior measurement. 2. Stable appearance of celiac lymph and retroperitoneal lymph nodes. Dominant node with calcification in the gastrohepatic ligament as described. 3. Continued decrease  in size of LEFT upper lobe nodule. 4. Three-vessel coronary artery calcification. 5. Aortic atherosclerosis.      INTERVAL HISTORY:  Lindsay [name] is here for a follow up of  metastatic colon cancer. She was last seen by me on 09/11/21. She presents to the clinic alone. She reports she is stable, skin toxicity well managed.   All other systems were reviewed with the patient and are negative.  MEDICAL HISTORY:  Past Medical History:  Diagnosis Date   Arthritis    Diabetes mellitus without complication ([place])    Hypertension    met colon ca to liver 01/2020    SURGICAL HISTORY: Past Surgical History:  Procedure Laterality Date   ABDOMINAL HYSTERECTOMY     ANTERIOR AND POSTERIOR REPAIR WITH SACROSPINOUS FIXATION N/A 07/16/2016   Procedure: ANTERIOR AND POSTERIOR REPAIR WITH SACROSPINOUS FIXATION;  Surgeon: Everlene Farrier, MD;  Location: [facility];  Service: Gynecology;  Laterality: N/A;   CYSTOSCOPY  07/16/2016   Procedure: CYSTOSCOPY;  Surgeon: Everlene Farrier, MD;  Location: [facility];  Service: Gynecology;;   ESOPHAGOGASTRODUODENOSCOPY (EGD) WITH PROPOFOL N/A 12/22/2019   Procedure: ESOPHAGOGASTRODUODENOSCOPY (EGD) WITH PROPOFOL;  Surgeon: [name], MD;  Location: [facility];  Service: Endoscopy;  Laterality: N/A;   FINE NEEDLE ASPIRATION N/A 12/22/2019   Procedure: FINE NEEDLE ASPIRATION (FNA) LINEAR;  Surgeon: [name], MD;  Location: [facility];  Service: Endoscopy;  Laterality: N/A;   IR IMAGING GUIDED PORT INSERTION  01/24/2020   LAPAROSCOPIC VAGINAL HYSTERECTOMY WITH SALPINGECTOMY Bilateral 07/16/2016   Procedure: LAPAROSCOPIC ASSISTED VAGINAL HYSTERECTOMY WITH SALPINGECTOMY;  Surgeon: Everlene Farrier, MD;  Location: [facility];  Service: Gynecology;  Laterality: Bilateral;   MYOMECTOMY ABDOMINAL APPROACH     THYROID SURGERY     tyroid     UPPER ESOPHAGEAL ENDOSCOPIC ULTRASOUND (EUS) N/A 12/22/2019   Procedure: UPPER ESOPHAGEAL ENDOSCOPIC ULTRASOUND (EUS);  Surgeon: [name], MD;  Location: [facility];  Service: Endoscopy;  Laterality: N/A;    I have reviewed the social history and family history with the patient and they are unchanged from  previous note.  ALLERGIES:  is allergic to oxaliplatin.  MEDICATIONS:  Current Outpatient Medications  Medication Sig Dispense Refill   aspirin EC 81 MG tablet Take 1 tablet (81 mg total) by mouth daily. 90 tablet 3   atorvastatin (LIPITOR) 20 MG tablet Take 20 mg by mouth at bedtime.   1   capecitabine (XELODA) 500 MG tablet TAKE 3 TABLETS BY MOUTH 2 TIMES DAILY AFTER A MEAL. TAKE FOR 7 DAYS, FOLLOWED BY 7 DAYS OFF. REPEAT EVERY 14 DAYS. 84 tablet 1   clindamycin (CLINDAGEL) 1 % gel Apply topically 2 (two) times daily. 60 g 2   clobetasol cream (TEMOVATE) 1.61 % Apply 1 application topically daily.     esomeprazole (NEXIUM) 40 MG capsule Take 40 mg by mouth daily.     hydrochlorothiazide (MICROZIDE) 12.5 MG capsule Take 12.5 mg by mouth daily.     KLOR-CON M20 20 MEQ tablet TAKE 1 TABLET TWICE A DAY 180 tablet 3   lidocaine-prilocaine (EMLA) cream Apply 1 application topically as needed. 30 g 1   magic mouthwash w/lidocaine SOLN Take 5 mLs by mouth 4 (four) times daily. 475 mL 2   magnesium oxide (MAG-OX) 400 (240 Mg) MG tablet Take 2 tablets by mouth 3 (three) times daily.     MAGNESIUM-OXIDE 400 (240 Mg) MG tablet TAKE 2 TABLETS BY MOUTH THREE TIMES DAILY 180 tablet 0   omeprazole (PRILOSEC) 20 MG capsule  Take 1 capsule (20 mg total) by mouth daily. 30 capsule 2   ondansetron (ZOFRAN) 8 MG tablet TAKE 1 TABLET BY MOUTH EVERY 8 HOURS AS NEEDED FOR NAUSEA OR VOMITING 20 tablet 0   prochlorperazine (COMPAZINE) 10 MG tablet Take 1 tablet (10 mg total) by mouth every 6 (six) hours as needed for nausea or vomiting. 30 tablet 2   spironolactone (ALDACTONE) 25 MG tablet Take 25 mg by mouth daily with breakfast.     urea (CARMOL) 10 % cream Apply topically 2 (two) times daily. 71 g 2   valACYclovir (VALTREX) 1000 MG tablet Take 1 tablet (1,000 mg total) by mouth 2 (two) times daily. 10 tablet 0   verapamil (CALAN) 120 MG tablet Take 120 mg by mouth 3 (three) times daily.     No current  facility-administered medications for this visit.   Facility-Administered Medications Ordered in Other Visits  Medication Dose Route Frequency Provider Last Rate Last Admin   sodium chloride flush (NS) 0.9 % injection 10 mL  10 mL Intracatheter PRN [name], MD   10 mL at 10/03/21 1501    PHYSICAL EXAMINATION: ECOG PERFORMANCE STATUS: 1 - Symptomatic but completely ambulatory  Vitals:   10/03/21 1056  BP: 120/76  Pulse: 67  Resp: 17  Temp: 98.3 F (36.8 C)  SpO2: 98%   Wt Readings from Last 3 Encounters:  10/03/21 180 lb 9 oz (81.9 kg)  09/11/21 182 lb 12.8 oz (82.9 kg)  08/22/21 182 lb 14.4 oz (83 kg)     GENERAL:alert, no distress and comfortable SKIN: skin color normal, no rashes or significant lesions EYES: normal, Conjunctiva are pink and non-injected, sclera clear  NEURO: alert & oriented x 3 with fluent speech  LABORATORY DATA:  I have reviewed the data as listed    Latest Ref Rng & Units 10/03/2021   10:42 AM 09/11/2021   11:42 AM 08/22/2021   11:46 AM  CBC  WBC 4.0 - 10.5 K/uL 5.0  5.1  5.1   Hemoglobin 12.0 - 15.0 g/dL 12.2  12.0  12.1   Hematocrit 36.0 - 46.0 % 35.6  34.8  35.0   Platelets 150 - 400 K/uL 231  219  248         Latest Ref Rng & Units 10/03/2021   10:42 AM 09/11/2021   11:42 AM 08/22/2021   11:46 AM  CMP  Glucose 70 - 99 mg/dL 107  105  98   BUN 8 - 23 mg/dL _0 Creatinine 0.44 - 1.00 mg/dL 0.64  0.58  0.64   Sodium 135 - 145 mmol/L 139  139  138   Potassium 3.5 - 5.1 mmol/L 3.6  3.6  3.7   Chloride 98 - 111 mmol/L 103  103  103   CO2 22 - 32 mmol/L 31  32  29   Calcium 8.9 - 10.3 mg/dL 9.6  9.7  9.8   Total Protein 6.5 - 8.1 g/dL 7.8  7.6  7.8   Total Bilirubin 0.3 - 1.2 mg/dL 0.4  0.4  0.3   Alkaline Phos 38 - 126 U/L 88  84  86   AST 15 - 41 U/L _1 ALT 0 - 44 U/L 37  28  29       RADIOGRAPHIC STUDIES: I have personally reviewed the radiological images as listed and agreed with the findings in the report. No  results found.  No orders of the defined types were placed in this encounter.  All questions were answered. The patient knows to call the clinic with any problems, questions or concerns. No barriers to learning was detected. The total time spent in the appointment was 30 minutes.     Yan Feng, MD 10/03/2021   I, Katie Daubenspeck, am acting as scribe for Yan Feng, MD.   I have reviewed the above documentation for accuracy and completeness, and I agree with the above.     

## 2021-10-03 NOTE — Patient Instructions (Signed)
[facility]COL[facility]  Discharge Instructions: Thank you for choosing [facility] to provide your oncology and hematology care.   If you have a lab appointment with the [facility], please go directly to the [facility] and check in at the registration area.   Wear comfortable clothing and clothing appropriate for easy access to any Portacath or PICC line.   We strive to give you quality time with your provider. You may need to reschedule your appointment if you arrive late (15 or more minutes).  Arriving late affects you and other patients whose appointments are after yours.  Also, if you miss three or more appointments without notifying the office, you may be dismissed from the clinic at the provider's discretion.      For prescription refill requests, have your pharmacy contact our office and allow 72 hours for refills to be completed.    Today you received the following chemotherapy and/or immunotherapy agents vectibix      To help prevent nausea and vomiting after your treatment, we encourage you to take your nausea medication as directed.  BELOW ARE SYMPTOMS THAT SHOULD BE REPORTED IMMEDIATELY: *FEVER GREATER THAN 100.4 F (38 C) OR HIGHER *CHILLS OR SWEATING *NAUSEA AND VOMITING THAT IS NOT CONTROLLED WITH YOUR NAUSEA MEDICATION *UNUSUAL SHORTNESS OF BREATH *UNUSUAL BRUISING OR BLEEDING *URINARY PROBLEMS (pain or burning when urinating, or frequent urination) *BOWEL PROBLEMS (unusual diarrhea, constipation, pain near the anus) TENDERNESS IN MOUTH AND THROAT WITH OR WITHOUT PRESENCE OF ULCERS (sore throat, sores in mouth, or a toothache) UNUSUAL RASH, SWELLING OR PAIN  UNUSUAL VAGINAL DISCHARGE OR ITCHING   Items with * indicate a potential emergency and should be followed up as soon as possible or go to the Emergency Department if any problems should occur.  Please show the CHEMOTHERAPY ALERT CARD or IMMUNOTHERAPY ALERT CARD at check-in to  the Emergency Department and triage nurse.  Should you have questions after your visit or need to cancel or reschedule your appointment, please contact [facility]  Dept: [phone]  and follow the prompts.  Office hours are 8:00 a.m. to 4:30 p.m. Monday - Friday. Please note that voicemails left after 4:00 p.m. may not be returned until the following business day.  We are closed weekends and major holidays. You have access to a nurse at all times for urgent questions. Please call the main number to the clinic Dept: [phone] and follow the prompts.   For any non-urgent questions, you may also contact your provider using MyChart. We now offer e-Visits for anyone 46 and older to request care online for non-urgent symptoms. For details visit [email or web address].   Also download the MyChart app! Go to the app store, search "MyChart", open the app, select [facility], and log in with your MyChart username and password.  Masks are optional in the cancer centers. If you would like for your care team to wear a mask while they are taking care of you, please let them know. For doctor visits, patients may have with them one support person who is at least 68 years old. At this time, visitors are not allowed in the infusion area.

## 2021-10-13 ENCOUNTER — Ambulatory Visit
Admission: RE | Admit: 2021-10-13 | Discharge: 2021-10-13 | Disposition: A | Discharge status: Home / Self Care | Payer: Medicare (Managed Care) | Source: Ambulatory Visit | Attending: Hematology | Admitting: Hematology

## 2021-10-13 DIAGNOSIS — C221 Intrahepatic bile duct carcinoma: Secondary | ICD-10-CM

## 2021-10-13 MED ORDER — IOPAMIDOL (ISOVUE-300) INJECTION 61%
100.0000 mL | Freq: Once | INTRAVENOUS | Status: AC | PRN
  Administered 2021-10-13: 100 mL via INTRAVENOUS

## 2021-10-15 ENCOUNTER — Inpatient Hospital Stay: Payer: Medicare (Managed Care) | Admitting: Hematology

## 2021-10-15 ENCOUNTER — Inpatient Hospital Stay: Payer: Medicare (Managed Care) | Attending: Hematology | Admitting: Hematology

## 2021-10-15 DIAGNOSIS — C221 Intrahepatic bile duct carcinoma: Secondary | ICD-10-CM

## 2021-10-16 ENCOUNTER — Encounter: Payer: Self-pay | Admitting: Hematology

## 2021-10-16 NOTE — Progress Notes (Signed)
[place]   Telephone:([phone]) [phone] Fax:([phone]) [phone]   Clinic Follow up Note   Patient Care Team: [name], MD as PCP - General (Internal Medicine) [name], RN (Inactive) as Oncology Nurse Navigator [name], MD as Consulting Physician (Oncology) [name], MD as Consulting Physician (Gastroenterology)  Date of Service:  10/16/2021  I connected with Lindsay [name] on 04/11/7626 at 11:40 AM EDT by telephone and verified that I am speaking with the correct person using two identifiers.   I discussed the limitations, risks, security and privacy concerns of performing an evaluation and management service by telephone and the availability of in person appointments. I also discussed with the patient that there may be a patient responsible charge related to this service. The patient expressed understanding and agreed to proceed.   Patient's location:  Home  Provider's location:  Office   CHIEF COMPLAINT: f/u of metastatic colon cancer  CURRENT THERAPY:  -Xeloda started 01/25/20  -current dose: 1500 mg BID days 1-7 q14d -Vectibix, q3weeks, started 02/06/20  ASSESSMENT & PLAN:  Lindsay [name] is a 66 y.o. female with   1. 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 MSS, [name]/[name] wildtype -Her 12/15/19 MRI abdomen showed 2 large liver masses and bulky porta hepatis and retroperitoneal adenopathy. -Her EUS from 12/22/19 showed 3.5cm LN in porta hepatis region and biopsy confirmed metastatic adenocarcinoma, insufficient tissue for IHC study but morphology does not support HCC. -Liver biopsy 01/08/20 confirmed adenocarcinoma, immunostain supporting primary colorectal cancer -PET scan showed: known liver metastasis; diffuse thoracic and abdominal adenopathy; hypermetabolic 1.8 cm pulmonary nodule in LUL; focal hypermetabolic lesion in splenic fixture of colon. -treated with first line FOLFOX 01/25/20 - 10/02/20, Vectibix added with cycle 2. Oxali stopped  due to reaction. -Due to skin cracking and concern for 5-FU infusion reactions she was switched to Xeloda. Dose has been adjusted due to significant skin toxicity. -restaging CT CAP on 06/20/21 showed overall stable disease. -she is tolerating Xeloda 1500 mg BID 7 days on/7 days off well overall.  -I reviewed her recent restaging CT scan and discussed the findings with her.  Scan showed overall stable disease, no evidence of progression. -Will continue current therapy.  2. Chemo Toxicities: Skin and nail changes, hand-foot syndrome, hypomagnesia, acid reflux -secondary to 5-FU and worse on Xeloda -tolerating better on 1500 mg 7 days on/7 days off -she is currently taking magnesium 800 mg QID. -she has acid reflux symptoms and uses tums and pepcid. She knows not to take pepcid at the same time as the Xeloda.   3. Grade 1 neuropathy  -S/p cycle 5, oxaliplatin dose reduced and eventually discontinued after reaction 09/18/20 -Persistent numbness at the feet and hands, mild   4. HTN, DM, Heart Murmur, Arthritis -Per PCP and Cardiologist Dr Einar Gip -DM controlled, no pre-existing neuropathy   5. Financial and Social Support -She has Lindsay [name]. She is retired. -She lives with husband and has brothers and sister in and out of town. Her only child passed in recent years.   6. Goals of care -we discussed the incurable but treatable nature of her stage IV cancer and general prognosis. She understands the prognosis is poor if she does not tolerate or respond well to chemo -she understands the goal is palliative -full code for now      PLAN: -Reviewed restaging CT scan from October 13, 2021, stable disease -She will return next week for treatment.    No problem-specific Assessment & Plan  notes found for this encounter.   SUMMARY OF ONCOLOGIC HISTORY: Oncology History Overview Note  Cancer Staging No matching staging information was found for the patient.    metastatic colon cancer to  liver  06/20/2019 Procedure   Colonoscopy by Dr [name] 06/20/19  IMPRESSION -Seven 3 to 10 mm polyps in the sigmoid colon, in the transverse colon and in the escending colon, removed with a cold snare. Resected and retrireved.  -One 69m polyp in the descending colon. Biopsies. Tattoes.  -Mediaum sized lipoma in the ascending colon.   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 See Comment B. Colon, Ascending, polyp, Polypectomy:  -TUBULAR ADENOMA -No high grade dysplasia or malignancy.  C. Colon, TRansverse, Polyo, polectomy:  -TUBULAR ADENOMA -No high grade dysplasia or malignancy.  D. Colon, Sigmoid, Polyp, Polypectomy:  -HYPERPLASTIC POLYP   11/07/2019 Imaging   [place]domen 11/07/19  IMPRESSION: 1. Two solid masses in the liver are nonspecific. Recommend MRI abdomen with and without contrast for further evaluation.   12/15/2019 Imaging   MRI Abdomen 12/15/19  IMPRESSION: 1. There are two large masses in the liver with appearance favoring metastatic disease or hepatocellular carcinoma or cholangiocarcinoma. A benign etiology is highly unlikely given the enhancement pattern and associated adenopathy. 2. Considerable porta hepatis and retroperitoneal adenopathy. Some of the confluent porta hepatis tumor is potentially infiltrative and abuts the pancreatic body along its right upper margin, making it difficult to completely exclude the possibility of pancreatic adenocarcinoma primary. Possibilities helpful in further workup might include tissue diagnosis, endoscopic ultrasound, or nuclear medicine PET-CT. 3. Pancreas divisum. 4. Lumbar spondylosis and degenerative disc disease. 5. Despite efforts by the technologist and patient, motion artifact is present on today's exam and could not be eliminated. This reduces exam sensitivity and specificity.   12/22/2019 Procedure   Upper Endoscopy by Dr  [name]10/15/21  IMPRESSION - One lymph node was visualized and measured in the porta hepatis region. Fine needle aspiration performed    12/22/2019 Initial Biopsy   A. LIVER, PORTA HEPATIS MASS, FINE NEEDLE 12/22/19 ASPIRATION:  FINAL MICROSCOPIC DIAGNOSIS:  - Malignant cells consistent with metastatic adenocarcinoma   01/01/2020 Initial Diagnosis   [place]atic [place]angiocarcinoma [place]   01/08/2020 Initial Biopsy   FINAL MICROSCOPIC DIAGNOSIS:   A. LIVER, LEFT LOBE, BIOPSY:  - Metastatic adenocarcinoma, consistent with a colorectal primary.  See  comment      COMMENT:   Immunohistochemical stains show the tumor cells are positive for CK20  and CDX2 but negative for CK7, consistent with above interpretation.  Dr. [name] notified on 01/10/2020   01/08/2020 Genetic Testing   [name]  [name] wildtype and [name]/[name] mutations which make her eligible for target biological agent Vectibix.   01/24/2020 Procedure   PAC placed 01/24/20   01/25/2020 -  Chemotherapy   first line FOLFOX starting 01/25/2020, Vextibix  added with C2 (02/06/20)   06/20/2020 Imaging   CT CAP  IMPRESSION: 1. Continued interval reduction in size and conspicuity of a subsolid nodule of the peripheral left upper lobe. 2. Unchanged prominent pretracheal and subcarinal lymph nodes. 3. Redemonstrated partially calcified low-attenuation liver masses, slightly decreased in size compared to prior examination. 4. Slight interval decrease in size of a portacaval lymph node or conglomerate and retroperitoneal lymph nodes. 5. Findings are consistent with continued treatment response of nodal, pulmonary, and hepatic metastatic disease. 6. Coronary artery disease.   Aortic Atherosclerosis (ICD10-I70.0).  10/09/2020 Imaging   IMPRESSION: 1. Slight decrease in size of dominant liver mass with smaller liver mass within 1-2 mm of prior measurement. 2. Stable appearance of celiac lymph and retroperitoneal  lymph nodes. Dominant node with calcification in the gastrohepatic ligament as described. 3. Continued decrease in size of LEFT upper lobe nodule. 4. Three-vessel coronary artery calcification. 5. Aortic atherosclerosis.      INTERVAL HISTORY:  CHRISTINIA LAMBETH is scheduled for a virtual visit to discuss her restaging CT scan.  She is clinically stable, tolerating treatment very well, no complaints except mild fatigue.  She functions well at home.  MEDICAL HISTORY:  Past Medical History:  Diagnosis Date   Arthritis    Diabetes mellitus without complication ([place])    Hypertension    met colon ca to liver 01/2020    SURGICAL HISTORY: Past Surgical History:  Procedure Laterality Date   ABDOMINAL HYSTERECTOMY     ANTERIOR AND POSTERIOR REPAIR WITH SACROSPINOUS FIXATION N/A 07/16/2016   Procedure: ANTERIOR AND POSTERIOR REPAIR WITH SACROSPINOUS FIXATION;  Surgeon: Everlene Farrier, MD;  Location: [facility];  Service: Gynecology;  Laterality: N/A;   CYSTOSCOPY  07/16/2016   Procedure: CYSTOSCOPY;  Surgeon: Everlene Farrier, MD;  Location: [facility];  Service: Gynecology;;   ESOPHAGOGASTRODUODENOSCOPY (EGD) WITH PROPOFOL N/A 12/22/2019   Procedure: ESOPHAGOGASTRODUODENOSCOPY (EGD) WITH PROPOFOL;  Surgeon: [name], MD;  Location: [facility];  Service: Endoscopy;  Laterality: N/A;   FINE NEEDLE ASPIRATION N/A 12/22/2019   Procedure: FINE NEEDLE ASPIRATION (FNA) LINEAR;  Surgeon: [name], MD;  Location: [facility];  Service: Endoscopy;  Laterality: N/A;   IR IMAGING GUIDED PORT INSERTION  01/24/2020   LAPAROSCOPIC VAGINAL HYSTERECTOMY WITH SALPINGECTOMY Bilateral 07/16/2016   Procedure: LAPAROSCOPIC ASSISTED VAGINAL HYSTERECTOMY WITH SALPINGECTOMY;  Surgeon: Everlene Farrier, MD;  Location: [facility];  Service: Gynecology;  Laterality: Bilateral;   MYOMECTOMY ABDOMINAL APPROACH     THYROID SURGERY     tyroid     UPPER ESOPHAGEAL ENDOSCOPIC ULTRASOUND (EUS) N/A 12/22/2019   Procedure: UPPER  ESOPHAGEAL ENDOSCOPIC ULTRASOUND (EUS);  Surgeon: [name], MD;  Location: [facility];  Service: Endoscopy;  Laterality: N/A;    I have reviewed the social history and family history with the patient and they are unchanged from previous note.  ALLERGIES:  is allergic to oxaliplatin.  MEDICATIONS:  Current Outpatient Medications  Medication Sig Dispense Refill   aspirin EC 81 MG tablet Take 1 tablet (81 mg total) by mouth daily. 90 tablet 3   atorvastatin (LIPITOR) 20 MG tablet Take 20 mg by mouth at bedtime.   1   capecitabine (XELODA) 500 MG tablet TAKE 3 TABLETS BY MOUTH 2 TIMES DAILY AFTER A MEAL. TAKE FOR 7 DAYS, FOLLOWED BY 7 DAYS OFF. REPEAT EVERY 14 DAYS. 84 tablet 1   clindamycin (CLINDAGEL) 1 % gel Apply topically 2 (two) times daily. 60 g 2   clobetasol cream (TEMOVATE) 5.85 % Apply 1 application topically daily.     esomeprazole (NEXIUM) 40 MG capsule Take 40 mg by mouth daily.     hydrochlorothiazide (MICROZIDE) 12.5 MG capsule Take 12.5 mg by mouth daily.     KLOR-CON M20 20 MEQ tablet TAKE 1 TABLET TWICE A DAY 180 tablet 3   lidocaine-prilocaine (EMLA) cream Apply 1 application topically as needed. 30 g 1   magic mouthwash w/lidocaine SOLN Take 5 mLs by mouth 4 (four) times daily. 475 mL 2   magnesium oxide (MAG-OX) 400 (240 Mg) MG tablet Take 2 tablets by  mouth 3 (three) times daily.     MAGNESIUM-OXIDE 400 (240 Mg) MG tablet TAKE 2 TABLETS BY MOUTH THREE TIMES DAILY 180 tablet 0   omeprazole (PRILOSEC) 20 MG capsule Take 1 capsule (20 mg total) by mouth daily. 30 capsule 2   ondansetron (ZOFRAN) 8 MG tablet TAKE 1 TABLET BY MOUTH EVERY 8 HOURS AS NEEDED FOR NAUSEA OR VOMITING 20 tablet 0   prochlorperazine (COMPAZINE) 10 MG tablet Take 1 tablet (10 mg total) by mouth every 6 (six) hours as needed for nausea or vomiting. 30 tablet 2   spironolactone (ALDACTONE) 25 MG tablet Take 25 mg by mouth daily with breakfast.     urea (CARMOL) 10 % cream Apply topically 2 (two)  times daily. 71 g 2   valACYclovir (VALTREX) 1000 MG tablet Take 1 tablet (1,000 mg total) by mouth 2 (two) times daily. 10 tablet 0   verapamil (CALAN) 120 MG tablet Take 120 mg by mouth 3 (three) times daily.     No current facility-administered medications for this visit.    PHYSICAL EXAMINATION: ECOG PERFORMANCE STATUS: 1 - Symptomatic but completely ambulatory  There were no vitals filed for this visit.  Wt Readings from Last 3 Encounters:  10/03/21 180 lb 9 oz (81.9 kg)  09/11/21 182 lb 12.8 oz (82.9 kg)  08/22/21 182 lb 14.4 oz (83 kg)     No exam today   LABORATORY DATA:  I have reviewed the data as listed    Latest Ref Rng & Units 10/03/2021   10:42 AM 09/11/2021   11:42 AM 08/22/2021   11:46 AM  CBC  WBC 4.0 - 10.5 K/uL 5.0  5.1  5.1   Hemoglobin 12.0 - 15.0 g/dL 12.2  12.0  12.1   Hematocrit 36.0 - 46.0 % 35.6  34.8  35.0   Platelets 150 - 400 K/uL 231  219  248         Latest Ref Rng & Units 10/03/2021   10:42 AM 09/11/2021   11:42 AM 08/22/2021   11:46 AM  CMP  Glucose 70 - 99 mg/dL 107  105  98   BUN 8 - 23 mg/dL _0 Creatinine 0.44 - 1.00 mg/dL 0.64  0.58  0.64   Sodium 135 - 145 mmol/L 139  139  138   Potassium 3.5 - 5.1 mmol/L 3.6  3.6  3.7   Chloride 98 - 111 mmol/L 103  103  103   CO2 22 - 32 mmol/L 31  32  29   Calcium 8.9 - 10.3 mg/dL 9.6  9.7  9.8   Total Protein 6.5 - 8.1 g/dL 7.8  7.6  7.8   Total Bilirubin 0.3 - 1.2 mg/dL 0.4  0.4  0.3   Alkaline Phos 38 - 126 U/L 88  84  86   AST 15 - 41 U/L _1 ALT 0 - 44 U/L 37  28  29       RADIOGRAPHIC STUDIES: I have personally reviewed the radiological images as listed and agreed with the findings in the report. No results found.   I discussed the assessment and treatment plan with the patient. The patient was provided an opportunity to ask questions and all were answered. The patient agreed with the plan and demonstrated an understanding of the instructions.   The patient was  advised to call back or seek an in-person evaluation if the symptoms worsen or if  the condition fails to improve as anticipated.  I provided 15 minutes of non face-to-face telephone visit time during this encounter, and > 50% was spent counseling as documented under my assessment & plan.    [name], MD 10/16/2021

## 2021-10-17 ENCOUNTER — Telehealth: Payer: Self-pay | Admitting: Hematology

## 2021-10-17 NOTE — Telephone Encounter (Signed)
Scheduled per 8/10 in basket, message has been left

## 2021-10-18 ENCOUNTER — Other Ambulatory Visit: Payer: Self-pay

## 2021-10-20 ENCOUNTER — Other Ambulatory Visit: Payer: Self-pay | Admitting: Hematology

## 2021-10-24 ENCOUNTER — Inpatient Hospital Stay: Payer: Medicare (Managed Care) | Admitting: Hematology

## 2021-10-24 ENCOUNTER — Inpatient Hospital Stay: Payer: Medicare (Managed Care)

## 2021-11-03 ENCOUNTER — Other Ambulatory Visit: Payer: Self-pay | Admitting: Hematology

## 2021-11-03 DIAGNOSIS — C189 Malignant neoplasm of colon, unspecified: Secondary | ICD-10-CM

## 2021-11-07 ENCOUNTER — Other Ambulatory Visit: Payer: Self-pay

## 2021-11-13 NOTE — Progress Notes (Signed)
[facility]   [phone] [phone]) [phone]   Clinic Follow up Note   Patient Care Team: [name], MD as PCP - General (Internal Medicine) [name], RN (Inactive) as Oncology Nurse Navigator [name], MD as Consulting Physician (Oncology) [name], MD as Consulting Physician (Gastroenterology)  Date of Service:  11/14/2021  CHIEF COMPLAINT: f/u of metastatic colon cancer  CURRENT THERAPY:  -Xeloda started 01/25/20  -current dose: 1500 mg BID days 1-7 q14d -Vectibix, q3weeks, started 02/06/20  ASSESSMENT & PLAN:  Lindsay [name] is a 66 y.o. female with   1. 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 MSS, [name]/[name] wildtype -Her 12/15/19 MRI abdomen showed 2 large liver masses and bulky porta hepatis and retroperitoneal adenopathy. -Her EUS from 12/22/19 showed 3.5cm LN in porta hepatis region and biopsy confirmed metastatic adenocarcinoma, insufficient tissue for IHC study but morphology does not support HCC. -Liver biopsy 01/08/20 confirmed adenocarcinoma, immunostain supporting primary colorectal cancer -PET scan showed: known liver metastasis; diffuse thoracic and abdominal adenopathy; hypermetabolic 1.8 cm pulmonary nodule in LUL; focal hypermetabolic lesion in splenic fixture of colon. -treated with first line FOLFOX 01/25/20 - 10/02/20, Vectibix added with cycle 2. Oxali stopped due to reaction. -Due to skin cracking and concern for 5-FU infusion reactions she was switched to Xeloda. Dose has been adjusted due to significant skin toxicity, currently 1500 mg BID 7 days on/7 days off. -restaging CT CAP on 10/13/21 showed overall stable disease. -she is tolerating Xeloda well overall at her current dose. Lab reviewed, overall stable. Her magnesium is actually WNL today. Will continue current therapy.   2. Chemo Toxicities: Skin and nail changes, hand-foot syndrome, hypomagnesia, acid reflux -secondary to 5-FU and worse on  Xeloda -tolerating better on 1500 mg 7 days on/7 days off -she is currently taking magnesium 800 mg QID. -she has acid reflux symptoms and uses tums and pepcid. She knows not to take pepcid at the same time as the Xeloda.   3. Grade 1 neuropathy  -S/p cycle 5, oxaliplatin dose reduced and eventually discontinued after reaction 09/18/20 -Persistent numbness at the feet and hands, mild     PLAN: -proceed with vectibix and magnesium today -continue Xeloda at same dose -lab, flush, f/u, and vectibix every 3 weeks   No problem-specific Assessment & Plan notes found for this encounter.   SUMMARY OF ONCOLOGIC HISTORY: Oncology History Overview Note  Cancer Staging No matching staging information was found for the patient.    metastatic colon cancer to liver  06/20/2019 Procedure   Colonoscopy by Dr [name] 06/20/19  IMPRESSION -Seven 3 to 10 mm polyps in the sigmoid colon, in the transverse colon and in the escending colon, removed with a cold snare. Resected and retrireved.  -One 35m polyp in the descending colon. Biopsies. Tattoes.  -Mediaum sized lipoma in the ascending colon.   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 See Comment B. Colon, Ascending, polyp, Polypectomy:  -TUBULAR ADENOMA -No high grade dysplasia or malignancy.  C. Colon, TRansverse, Polyo, polectomy:  -TUBULAR ADENOMA -No high grade dysplasia or malignancy.  D. Colon, Sigmoid, Polyp, Polypectomy:  -HYPERPLASTIC POLYP   11/07/2019 Imaging   [place]domen 11/07/19  IMPRESSION: 1. Two solid masses in the liver are nonspecific. Recommend MRI abdomen with and without contrast for further evaluation.   12/15/2019 Imaging   MRI Abdomen 12/15/19  IMPRESSION: 1. There are two large masses in the liver  with appearance favoring metastatic disease or hepatocellular carcinoma or cholangiocarcinoma. A benign etiology is highly unlikely  given the enhancement pattern and associated adenopathy. 2. Considerable porta hepatis and retroperitoneal adenopathy. Some of the confluent porta hepatis tumor is potentially infiltrative and abuts the pancreatic body along its right upper margin, making it difficult to completely exclude the possibility of pancreatic adenocarcinoma primary. Possibilities helpful in further workup might include tissue diagnosis, endoscopic ultrasound, or nuclear medicine PET-CT. 3. Pancreas divisum. 4. Lumbar spondylosis and degenerative disc disease. 5. Despite efforts by the technologist and patient, motion artifact is present on today's exam and could not be eliminated. This reduces exam sensitivity and specificity.   12/22/2019 Procedure   Upper Endoscopy by Dr Benson Norway 12/22/19  IMPRESSION - One lymph node was visualized and measured in the porta hepatis region. Fine needle aspiration performed    12/22/2019 Initial Biopsy   A. LIVER, PORTA HEPATIS MASS, FINE NEEDLE 12/22/19 ASPIRATION:  FINAL MICROSCOPIC DIAGNOSIS:  - Malignant cells consistent with metastatic adenocarcinoma   01/01/2020 Initial Diagnosis   Intrahepatic cholangiocarcinoma ([place])   01/08/2020 Initial Biopsy   FINAL MICROSCOPIC DIAGNOSIS:   A. LIVER, LEFT LOBE, BIOPSY:  - Metastatic adenocarcinoma, consistent with a colorectal primary.  See  comment      COMMENT:   Immunohistochemical stains show the tumor cells are positive for CK20  and CDX2 but negative for CK7, consistent with above interpretation.  Dr. Burr Medico was notified on 01/10/2020   01/08/2020 Genetic Testing   [name]  [name] wildtype and [name]/[name] mutations which make her eligible for target biological agent Vectibix.   01/24/2020 Procedure   PAC placed 01/24/20   01/25/2020 -  Chemotherapy   first line FOLFOX starting 01/25/2020, Vextibix  added with C2 (02/06/20)   02/06/2020 -  Chemotherapy   Patient is on Treatment Plan : COLORECTAL Panitumumab  q14d ([facility] - Type Gene Only)     06/20/2020 Imaging   CT CAP  IMPRESSION: 1. Continued interval reduction in size and conspicuity of a subsolid nodule of the peripheral left upper lobe. 2. Unchanged prominent pretracheal and subcarinal lymph nodes. 3. Redemonstrated partially calcified low-attenuation liver masses, slightly decreased in size compared to prior examination. 4. Slight interval decrease in size of a portacaval lymph node or conglomerate and retroperitoneal lymph nodes. 5. Findings are consistent with continued treatment response of nodal, pulmonary, and hepatic metastatic disease. 6. Coronary artery disease.   Aortic Atherosclerosis (ICD10-I70.0).     10/09/2020 Imaging   IMPRESSION: 1. Slight decrease in size of dominant liver mass with smaller liver mass within 1-2 mm of prior measurement. 2. Stable appearance of celiac lymph and retroperitoneal lymph nodes. Dominant node with calcification in the gastrohepatic ligament as described. 3. Continued decrease in size of LEFT upper lobe nodule. 4. Three-vessel coronary artery calcification. 5. Aortic atherosclerosis.      INTERVAL HISTORY:  Lindsay [name] is here for a follow up of metastatic colon cancer. She was last seen by me on 10/16/21. She presents to the clinic alone. She reports she is doing well overall. She reports diarrhea twice in the mornings. She notes she is hesitant to take anything as it will cause constipation.   All other systems were reviewed with the patient and are negative.  MEDICAL HISTORY:  Past Medical History:  Diagnosis Date   Arthritis    Diabetes mellitus without complication ([place])    Hypertension    met colon ca to liver 01/2020    SURGICAL HISTORY:  Past Surgical History:  Procedure Laterality Date   ABDOMINAL HYSTERECTOMY     ANTERIOR AND POSTERIOR REPAIR WITH SACROSPINOUS FIXATION N/A 07/16/2016   Procedure: ANTERIOR AND POSTERIOR REPAIR WITH SACROSPINOUS FIXATION;   Surgeon: Everlene Farrier, MD;  Location: [facility];  Service: Gynecology;  Laterality: N/A;   CYSTOSCOPY  07/16/2016   Procedure: CYSTOSCOPY;  Surgeon: Everlene Farrier, MD;  Location: [facility];  Service: Gynecology;;   ESOPHAGOGASTRODUODENOSCOPY (EGD) WITH PROPOFOL N/A 12/22/2019   Procedure: ESOPHAGOGASTRODUODENOSCOPY (EGD) WITH PROPOFOL;  Surgeon: [name], MD;  Location: [facility];  Service: Endoscopy;  Laterality: N/A;   FINE NEEDLE ASPIRATION N/A 12/22/2019   Procedure: FINE NEEDLE ASPIRATION (FNA) LINEAR;  Surgeon: [name], MD;  Location: [facility];  Service: Endoscopy;  Laterality: N/A;   IR IMAGING GUIDED PORT INSERTION  01/24/2020   LAPAROSCOPIC VAGINAL HYSTERECTOMY WITH SALPINGECTOMY Bilateral 07/16/2016   Procedure: LAPAROSCOPIC ASSISTED VAGINAL HYSTERECTOMY WITH SALPINGECTOMY;  Surgeon: Everlene Farrier, MD;  Location: [facility];  Service: Gynecology;  Laterality: Bilateral;   MYOMECTOMY ABDOMINAL APPROACH     THYROID SURGERY     tyroid     UPPER ESOPHAGEAL ENDOSCOPIC ULTRASOUND (EUS) N/A 12/22/2019   Procedure: UPPER ESOPHAGEAL ENDOSCOPIC ULTRASOUND (EUS);  Surgeon: [name], MD;  Location: [facility];  Service: Endoscopy;  Laterality: N/A;    I have reviewed the social history and family history with the patient and they are unchanged from previous note.  ALLERGIES:  is allergic to oxaliplatin.  MEDICATIONS:  Current Outpatient Medications  Medication Sig Dispense Refill   aspirin EC 81 MG tablet Take 1 tablet (81 mg total) by mouth daily. 90 tablet 3   atorvastatin (LIPITOR) 20 MG tablet Take 20 mg by mouth at bedtime.   1   capecitabine (XELODA) 500 MG tablet TAKE 3 TABLETS BY MOUTH 2 TIMES A DAY AFTER A MEAL FOR 7 DAYS ON, FOLLOWED BY 7 DAYS OFF. REPEAT EVERY 14 DAYS. 84 tablet 2   clindamycin (CLINDAGEL) 1 % gel Apply topically 2 (two) times daily. 60 g 2   clobetasol cream (TEMOVATE) 9.92 % Apply 1 application topically daily.     esomeprazole (NEXIUM) 40 MG  capsule Take 40 mg by mouth daily.     hydrochlorothiazide (MICROZIDE) 12.5 MG capsule Take 12.5 mg by mouth daily.     KLOR-CON M20 20 MEQ tablet TAKE 1 TABLET TWICE A DAY 180 tablet 3   lidocaine-prilocaine (EMLA) cream Apply 1 application topically as needed. 30 g 1   magic mouthwash w/lidocaine SOLN Take 5 mLs by mouth 4 (four) times daily. 475 mL 2   magnesium oxide (MAG-OX) 400 (240 Mg) MG tablet Take 2 tablets by mouth 3 (three) times daily.     MAGNESIUM-OXIDE 400 (240 Mg) MG tablet TAKE 2 TABLETS BY MOUTH THREE TIMES DAILY 180 tablet 0   omeprazole (PRILOSEC) 20 MG capsule Take 1 capsule (20 mg total) by mouth daily. 30 capsule 2   ondansetron (ZOFRAN) 8 MG tablet TAKE 1 TABLET BY MOUTH EVERY 8 HOURS AS NEEDED FOR NAUSEA OR VOMITING 20 tablet 0   prochlorperazine (COMPAZINE) 10 MG tablet Take 1 tablet (10 mg total) by mouth every 6 (six) hours as needed for nausea or vomiting. 30 tablet 2   spironolactone (ALDACTONE) 25 MG tablet Take 25 mg by mouth daily with breakfast.     urea (CARMOL) 10 % cream Apply topically 2 (two) times daily. 71 g 2   valACYclovir (VALTREX) 1000 MG tablet Take 1 tablet (1,000 mg total)  by mouth 2 (two) times daily. 10 tablet 0   verapamil (CALAN) 120 MG tablet Take 120 mg by mouth 3 (three) times daily.     No current facility-administered medications for this visit.   Facility-Administered Medications Ordered in Other Visits  Medication Dose Route Frequency Provider Last Rate Last Admin   sodium chloride flush (NS) 0.9 % injection 10 mL  10 mL Intracatheter PRN [name], MD   10 mL at 11/14/21 1610    PHYSICAL EXAMINATION: ECOG PERFORMANCE STATUS: 1 - Symptomatic but completely ambulatory  Vitals:   11/14/21 1130  BP: (!) 150/92  Pulse: 61  Resp: 14  Temp: (!) 97.5 F (36.4 C)  SpO2: 100%   Wt Readings from Last 3 Encounters:  11/14/21 179 lb 4.8 oz (81.3 kg)  10/03/21 180 lb 9 oz (81.9 kg)  09/11/21 182 lb 12.8 oz (82.9 kg)      GENERAL:alert, no distress and comfortable SKIN: skin color normal, no rashes or significant lesions EYES: normal, Conjunctiva are pink and non-injected, sclera clear  NEURO: alert & oriented x 3 with fluent speech  LABORATORY DATA:  I have reviewed the data as listed    Latest Ref Rng & Units 11/14/2021   10:44 AM 10/03/2021   10:42 AM 09/11/2021   11:42 AM  CBC  WBC 4.0 - 10.5 K/uL 4.1  5.0  5.1   Hemoglobin 12.0 - 15.0 g/dL 12.0  12.2  12.0   Hematocrit 36.0 - 46.0 % 35.4  35.6  34.8   Platelets 150 - 400 K/uL 209  231  219         Latest Ref Rng & Units 11/14/2021   10:44 AM 10/03/2021   10:42 AM 09/11/2021   11:42 AM  CMP  Glucose 70 - 99 mg/dL 116  107  105   BUN 8 - 23 mg/dL _0 Creatinine 0.44 - 1.00 mg/dL 0.62  0.64  0.58   Sodium 135 - 145 mmol/L 138  139  139   Potassium 3.5 - 5.1 mmol/L 3.8  3.6  3.6   Chloride 98 - 111 mmol/L 104  103  103   CO2 22 - 32 mmol/L 28  31  32   Calcium 8.9 - 10.3 mg/dL 9.8  9.6  9.7   Total Protein 6.5 - 8.1 g/dL 7.7  7.8  7.6   Total Bilirubin 0.3 - 1.2 mg/dL 0.3  0.4  0.4   Alkaline Phos 38 - 126 U/L 90  88  84   AST 15 - 41 U/L _1 ALT 0 - 44 U/L 30  37  28       RADIOGRAPHIC STUDIES: I have personally reviewed the radiological images as listed and agreed with the findings in the report. No results found.    Orders Placed This Encounter  Procedures   Magnesium    Standing Status:   Future    Number of Occurrences:   1    Standing Expiration Date:   11/15/2022   Magnesium    Standing Status:   Future    Standing Expiration Date:   12/06/2022   Magnesium    Standing Status:   Future    Standing Expiration Date:   12/27/2022   All questions were answered. The patient knows to call the clinic with any problems, questions or concerns. No barriers to learning was detected.      [name], MD  11/14/2021   I, Wilburn Mylar, am acting as scribe for [name], MD.   I have reviewed the above  documentation for accuracy and completeness, and I agree with the above.

## 2021-11-14 ENCOUNTER — Inpatient Hospital Stay: Payer: Medicare (Managed Care)

## 2021-11-14 ENCOUNTER — Inpatient Hospital Stay: Payer: Medicare (Managed Care) | Attending: Hematology

## 2021-11-14 ENCOUNTER — Other Ambulatory Visit: Payer: Self-pay

## 2021-11-14 ENCOUNTER — Inpatient Hospital Stay (HOSPITAL_BASED_OUTPATIENT_CLINIC_OR_DEPARTMENT_OTHER): Payer: Medicare (Managed Care) | Admitting: Hematology

## 2021-11-14 ENCOUNTER — Encounter: Payer: Self-pay | Admitting: Hematology

## 2021-11-14 VITALS — BP 132/93 | HR 75

## 2021-11-14 VITALS — BP 150/92 | HR 61 | Temp 97.5°F | Resp 14 | Wt 179.3 lb

## 2021-11-14 DIAGNOSIS — E1140 Type 2 diabetes mellitus with diabetic neuropathy, unspecified: Secondary | ICD-10-CM | POA: Diagnosis not present

## 2021-11-14 DIAGNOSIS — Z7189 Other specified counseling: Secondary | ICD-10-CM

## 2021-11-14 DIAGNOSIS — C7802 Secondary malignant neoplasm of left lung: Secondary | ICD-10-CM | POA: Diagnosis not present

## 2021-11-14 DIAGNOSIS — C787 Secondary malignant neoplasm of liver and intrahepatic bile duct: Secondary | ICD-10-CM | POA: Insufficient documentation

## 2021-11-14 DIAGNOSIS — Z95828 Presence of other vascular implants and grafts: Secondary | ICD-10-CM

## 2021-11-14 DIAGNOSIS — C189 Malignant neoplasm of colon, unspecified: Secondary | ICD-10-CM

## 2021-11-14 DIAGNOSIS — Z87891 Personal history of nicotine dependence: Secondary | ICD-10-CM | POA: Diagnosis not present

## 2021-11-14 DIAGNOSIS — C778 Secondary and unspecified malignant neoplasm of lymph nodes of multiple regions: Secondary | ICD-10-CM | POA: Diagnosis not present

## 2021-11-14 DIAGNOSIS — K219 Gastro-esophageal reflux disease without esophagitis: Secondary | ICD-10-CM | POA: Insufficient documentation

## 2021-11-14 DIAGNOSIS — Z7984 Long term (current) use of oral hypoglycemic drugs: Secondary | ICD-10-CM | POA: Diagnosis not present

## 2021-11-14 DIAGNOSIS — Z86010 Personal history of colonic polyps: Secondary | ICD-10-CM | POA: Insufficient documentation

## 2021-11-14 DIAGNOSIS — C19 Malignant neoplasm of rectosigmoid junction: Secondary | ICD-10-CM | POA: Insufficient documentation

## 2021-11-14 DIAGNOSIS — C221 Intrahepatic bile duct carcinoma: Secondary | ICD-10-CM | POA: Diagnosis not present

## 2021-11-14 DIAGNOSIS — Z5112 Encounter for antineoplastic immunotherapy: Secondary | ICD-10-CM | POA: Diagnosis present

## 2021-11-14 DIAGNOSIS — R011 Cardiac murmur, unspecified: Secondary | ICD-10-CM | POA: Diagnosis not present

## 2021-11-14 LAB — CBC WITH DIFFERENTIAL (CANCER CENTER ONLY)
Abs Immature Granulocytes: 0.01 10*3/uL (ref 0.00–0.07)
Basophils Absolute: 0 10*3/uL (ref 0.0–0.1)
Basophils Relative: 1 %
Eosinophils Absolute: 0.1 10*3/uL (ref 0.0–0.5)
Eosinophils Relative: 2 %
HCT: 35.4 % — ABNORMAL LOW (ref 36.0–46.0)
Hemoglobin: 12 g/dL (ref 12.0–15.0)
Immature Granulocytes: 0 %
Lymphocytes Relative: 28 %
Lymphs Abs: 1.1 10*3/uL (ref 0.7–4.0)
MCH: 32.5 pg (ref 26.0–34.0)
MCHC: 33.9 g/dL (ref 30.0–36.0)
MCV: 95.9 fL (ref 80.0–100.0)
Monocytes Absolute: 0.5 10*3/uL (ref 0.1–1.0)
Monocytes Relative: 11 %
Neutro Abs: 2.4 10*3/uL (ref 1.7–7.7)
Neutrophils Relative %: 58 %
Platelet Count: 209 10*3/uL (ref 150–400)
RBC: 3.69 MIL/uL — ABNORMAL LOW (ref 3.87–5.11)
RDW: 15.8 % — ABNORMAL HIGH (ref 11.5–15.5)
WBC Count: 4.1 10*3/uL (ref 4.0–10.5)
nRBC: 0 % (ref 0.0–0.2)

## 2021-11-14 LAB — CMP (CANCER CENTER ONLY)
ALT: 30 U/L (ref 0–44)
AST: 23 U/L (ref 15–41)
Albumin: 4.2 g/dL (ref 3.5–5.0)
Alkaline Phosphatase: 90 U/L (ref 38–126)
Anion gap: 6 (ref 5–15)
BUN: 14 mg/dL (ref 8–23)
CO2: 28 mmol/L (ref 22–32)
Calcium: 9.8 mg/dL (ref 8.9–10.3)
Chloride: 104 mmol/L (ref 98–111)
Creatinine: 0.62 mg/dL (ref 0.44–1.00)
GFR, Estimated: >60 mL/min (ref >60)
Glucose, Bld: 116 mg/dL — ABNORMAL HIGH (ref 70–99)
Potassium: 3.8 mmol/L (ref 3.5–5.1)
Sodium: 138 mmol/L (ref 135–145)
Total Bilirubin: 0.3 mg/dL (ref 0.3–1.2)
Total Protein: 7.7 g/dL (ref 6.5–8.1)

## 2021-11-14 LAB — CEA (IN HOUSE-CHCC): CEA (CHCC-In House): 3.57 ng/mL (ref 0.00–5.00)

## 2021-11-14 LAB — MAGNESIUM: Magnesium: 2 mg/dL (ref 1.7–2.4)

## 2021-11-14 MED ORDER — SODIUM CHLORIDE 0.9 % IV SOLN: Freq: Once | INTRAVENOUS | Status: AC

## 2021-11-14 MED ORDER — MAGNESIUM SULFATE 4 GM/100ML IV SOLN
4.0000 g | Freq: Once | INTRAVENOUS | Status: AC
  Administered 2021-11-14: 4 g via INTRAVENOUS
  Filled 2021-11-14: qty 100

## 2021-11-14 MED ORDER — ALTEPLASE 2 MG IJ SOLR: 2.0000 mg | Freq: Once | INTRAMUSCULAR | Status: DC | PRN

## 2021-11-14 MED ORDER — SODIUM CHLORIDE 0.9% FLUSH
10.0000 mL | INTRAVENOUS | Status: DC | PRN
  Administered 2021-11-14: 10 mL

## 2021-11-14 MED ORDER — HEPARIN SOD (PORK) LOCK FLUSH 100 UNIT/ML IV SOLN
500.0000 [IU] | Freq: Once | INTRAVENOUS | Status: AC | PRN
  Administered 2021-11-14: 500 [IU]

## 2021-11-14 MED ORDER — SODIUM CHLORIDE 0.9 % IV SOLN
6.0000 mg/kg | Freq: Once | INTRAVENOUS | Status: AC
  Administered 2021-11-14: 500 mg via INTRAVENOUS
  Filled 2021-11-14: qty 20

## 2021-11-14 MED ORDER — HEPARIN SOD (PORK) LOCK FLUSH 100 UNIT/ML IV SOLN: 250.0000 [IU] | Freq: Once | INTRAVENOUS | Status: DC | PRN

## 2021-11-14 MED ORDER — CAPECITABINE 500 MG PO TABS: ORAL_TABLET | ORAL | 2 refills | Status: DC

## 2021-11-14 MED ORDER — SODIUM CHLORIDE 0.9% FLUSH: 3.0000 mL | Freq: Once | INTRAVENOUS | Status: DC | PRN

## 2021-11-14 MED ORDER — SODIUM CHLORIDE 0.9% FLUSH
10.0000 mL | Freq: Once | INTRAVENOUS | Status: AC
  Administered 2021-11-14: 10 mL

## 2021-11-14 NOTE — Patient Instructions (Signed)
[facility]  Discharge Instructions: Thank you for choosing [facility] to provide your oncology and hematology care.   If you have a lab appointment with the [facility], please go directly to the [facility] and check in at the registration area.   Wear comfortable clothing and clothing appropriate for easy access to any Portacath or PICC line.   We strive to give you quality time with your provider. You may need to reschedule your appointment if you arrive late (15 or more minutes).  Arriving late affects you and other patients whose appointments are after yours.  Also, if you miss three or more appointments without notifying the office, you may be dismissed from the clinic at the provider's discretion.      For prescription refill requests, have your pharmacy contact our office and allow 72 hours for refills to be completed.    Today you received the following chemotherapy and/or immunotherapy agents: Vectibix.       To help prevent nausea and vomiting after your treatment, we encourage you to take your nausea medication as directed.  BELOW ARE SYMPTOMS THAT SHOULD BE REPORTED IMMEDIATELY: *FEVER GREATER THAN 100.4 F (38 C) OR HIGHER *CHILLS OR SWEATING *NAUSEA AND VOMITING THAT IS NOT CONTROLLED WITH YOUR NAUSEA MEDICATION *UNUSUAL SHORTNESS OF BREATH *UNUSUAL BRUISING OR BLEEDING *URINARY PROBLEMS (pain or burning when urinating, or frequent urination) *BOWEL PROBLEMS (unusual diarrhea, constipation, pain near the anus) TENDERNESS IN MOUTH AND THROAT WITH OR WITHOUT PRESENCE OF ULCERS (sore throat, sores in mouth, or a toothache) UNUSUAL RASH, SWELLING OR PAIN  UNUSUAL VAGINAL DISCHARGE OR ITCHING   Items with * indicate a potential emergency and should be followed up as soon as possible or go to the Emergency Department if any problems should occur.  Please show the CHEMOTHERAPY ALERT CARD or IMMUNOTHERAPY ALERT CARD at check-in to  the Emergency Department and triage nurse.  Should you have questions after your visit or need to cancel or reschedule your appointment, please contact [facility]  Dept: [phone]  and follow the prompts.  Office hours are 8:00 a.m. to 4:30 p.m. Monday - Friday. Please note that voicemails left after 4:00 p.m. may not be returned until the following business day.  We are closed weekends and major holidays. You have access to a nurse at all times for urgent questions. Please call the main number to the clinic Dept: [phone] and follow the prompts.   For any non-urgent questions, you may also contact your provider using MyChart. We now offer e-Visits for anyone 90 and older to request care online for non-urgent symptoms. For details visit [email or web address].   Also download the MyChart app! Go to the app store, search "MyChart", open the app, select [facility], and log in with your MyChart username and password.  Masks are optional in the cancer centers. If you would like for your care team to wear a mask while they are taking care of you, please let them know. You may have one support person who is at least 68 years old accompany you for your appointments. Magnesium Sulfate Injection What is this medication? MAGNESIUM SULFATE (mag NEE zee um SUL fate) prevents and treats low levels of magnesium in your body. It may also be used to prevent and treat seizures during pregnancy in people with high blood pressure disorders, such as preeclampsia or eclampsia. Magnesium plays an important role in maintaining the health of your muscles  and nervous system. This medicine may be used for other purposes; ask your health care provider or pharmacist if you have questions. What should I tell my care team before I take this medication? They need to know if you have any of these conditions: Heart disease History of irregular heart beat Kidney disease An unusual or  allergic reaction to magnesium sulfate, medications, foods, dyes, or preservatives Pregnant or trying to get pregnant Breast-feeding How should I use this medication? This medication is for infusion into a vein. It is given in a hospital or clinic setting. Talk to your care team about the use of this medication in children. While this medication may be prescribed for selected conditions, precautions do apply. Overdosage: If you think you have taken too much of this medicine contact a poison control center or emergency room at once. NOTE: This medicine is only for you. Do not share this medicine with others. What if I miss a dose? This does not apply. What may interact with this medication? Certain medications for anxiety or sleep Certain medications for seizures like phenobarbital Digoxin Medications that relax muscles for surgery Narcotic medications for pain This list may not describe all possible interactions. Give your health care provider a list of all the medicines, herbs, non-prescription drugs, or dietary supplements you use. Also tell them if you smoke, drink alcohol, or use illegal drugs. Some items may interact with your medicine. What should I watch for while using this medication? Your condition will be monitored carefully while you are receiving this medication. You may need blood work done while you are receiving this medication. What side effects may I notice from receiving this medication? Side effects that you should report to your care team as soon as possible: Allergic reactions--skin rash, itching, hives, swelling of the face, lips, tongue, or throat High magnesium level--confusion, drowsiness, facial flushing, redness, sweating, muscle weakness, fast or irregular heartbeat, trouble breathing Low blood pressure--dizziness, feeling faint or lightheaded, blurry vision Side effects that usually do not require medical attention (report to your care team if they continue or  are bothersome): Headache Nausea This list may not describe all possible side effects. Call your doctor for medical advice about side effects. You may report side effects to FDA at 1-800-FDA-1088. Where should I keep my medication? This medication is given in a hospital or clinic and will not be stored at home. NOTE: This sheet is a summary. It may not cover all possible information. If you have questions about this medicine, talk to your doctor, pharmacist, or health care provider.  2023 Elsevier/Gold Standard (2020-05-02 00:00:00)

## 2021-11-14 NOTE — Progress Notes (Signed)
Patient will receive the Mag 4 grams with Mag level of 2.0 per Dr. Burr Medico.

## 2021-11-16 ENCOUNTER — Other Ambulatory Visit: Payer: Self-pay

## 2021-11-21 ENCOUNTER — Other Ambulatory Visit: Payer: Self-pay | Admitting: Hematology

## 2021-11-27 ENCOUNTER — Telehealth: Payer: Self-pay | Admitting: Hematology

## 2021-11-27 NOTE — Telephone Encounter (Signed)
Scheduled follow-up appointments per appointment request workqueue. Patient is aware. 

## 2021-11-28 ENCOUNTER — Other Ambulatory Visit: Payer: Self-pay

## 2021-11-30 ENCOUNTER — Other Ambulatory Visit: Payer: Self-pay

## 2021-12-05 ENCOUNTER — Other Ambulatory Visit: Payer: Self-pay

## 2021-12-05 ENCOUNTER — Inpatient Hospital Stay: Payer: Medicare (Managed Care)

## 2021-12-05 ENCOUNTER — Inpatient Hospital Stay (HOSPITAL_BASED_OUTPATIENT_CLINIC_OR_DEPARTMENT_OTHER): Payer: Medicare (Managed Care) | Admitting: Hematology

## 2021-12-05 ENCOUNTER — Encounter: Payer: Self-pay | Admitting: Hematology

## 2021-12-05 VITALS — BP 111/67 | HR 59 | Temp 98.6°F | Resp 17 | Wt 182.0 lb

## 2021-12-05 DIAGNOSIS — C221 Intrahepatic bile duct carcinoma: Secondary | ICD-10-CM

## 2021-12-05 DIAGNOSIS — Z7189 Other specified counseling: Secondary | ICD-10-CM | POA: Diagnosis not present

## 2021-12-05 DIAGNOSIS — Z95828 Presence of other vascular implants and grafts: Secondary | ICD-10-CM

## 2021-12-05 DIAGNOSIS — Z5112 Encounter for antineoplastic immunotherapy: Secondary | ICD-10-CM | POA: Diagnosis not present

## 2021-12-05 DIAGNOSIS — C189 Malignant neoplasm of colon, unspecified: Secondary | ICD-10-CM

## 2021-12-05 LAB — CMP (CANCER CENTER ONLY)
ALT: 16 U/L (ref 0–44)
AST: 18 U/L (ref 15–41)
Albumin: 4.3 g/dL (ref 3.5–5.0)
Alkaline Phosphatase: 85 U/L (ref 38–126)
Anion gap: 4 — ABNORMAL LOW (ref 5–15)
BUN: 12 mg/dL (ref 8–23)
CO2: 30 mmol/L (ref 22–32)
Calcium: 9.5 mg/dL (ref 8.9–10.3)
Chloride: 104 mmol/L (ref 98–111)
Creatinine: 0.49 mg/dL (ref 0.44–1.00)
GFR, Estimated: >60 mL/min (ref >60)
Glucose, Bld: 87 mg/dL (ref 70–99)
Potassium: 3.9 mmol/L (ref 3.5–5.1)
Sodium: 138 mmol/L (ref 135–145)
Total Bilirubin: 0.4 mg/dL (ref 0.3–1.2)
Total Protein: 7.9 g/dL (ref 6.5–8.1)

## 2021-12-05 LAB — MAGNESIUM: Magnesium: 1.7 mg/dL (ref 1.7–2.4)

## 2021-12-05 LAB — CBC WITH DIFFERENTIAL (CANCER CENTER ONLY)
Abs Immature Granulocytes: 0 10*3/uL (ref 0.00–0.07)
Basophils Absolute: 0 10*3/uL (ref 0.0–0.1)
Basophils Relative: 1 %
Eosinophils Absolute: 0.1 10*3/uL (ref 0.0–0.5)
Eosinophils Relative: 2 %
HCT: 36.8 % (ref 36.0–46.0)
Hemoglobin: 12.4 g/dL (ref 12.0–15.0)
Immature Granulocytes: 0 %
Lymphocytes Relative: 30 %
Lymphs Abs: 1.2 10*3/uL (ref 0.7–4.0)
MCH: 32 pg (ref 26.0–34.0)
MCHC: 33.7 g/dL (ref 30.0–36.0)
MCV: 94.8 fL (ref 80.0–100.0)
Monocytes Absolute: 0.4 10*3/uL (ref 0.1–1.0)
Monocytes Relative: 10 %
Neutro Abs: 2.2 10*3/uL (ref 1.7–7.7)
Neutrophils Relative %: 57 %
Platelet Count: 207 10*3/uL (ref 150–400)
RBC: 3.88 MIL/uL (ref 3.87–5.11)
RDW: 16 % — ABNORMAL HIGH (ref 11.5–15.5)
WBC Count: 3.9 10*3/uL — ABNORMAL LOW (ref 4.0–10.5)
nRBC: 0 % (ref 0.0–0.2)

## 2021-12-05 LAB — CEA (IN HOUSE-CHCC): CEA (CHCC-In House): 1.26 ng/mL (ref 0.00–5.00)

## 2021-12-05 MED ORDER — SODIUM CHLORIDE 0.9% FLUSH
10.0000 mL | Freq: Once | INTRAVENOUS | Status: AC
  Administered 2021-12-05: 10 mL

## 2021-12-05 MED ORDER — MAGNESIUM SULFATE 4 GM/100ML IV SOLN
4.0000 g | Freq: Once | INTRAVENOUS | Status: AC
  Administered 2021-12-05: 4 g via INTRAVENOUS
  Filled 2021-12-05: qty 100

## 2021-12-05 MED ORDER — SODIUM CHLORIDE 0.9 % IV SOLN: Freq: Once | INTRAVENOUS | Status: AC

## 2021-12-05 MED ORDER — SODIUM CHLORIDE 0.9% FLUSH
10.0000 mL | INTRAVENOUS | Status: DC | PRN
  Administered 2021-12-05: 10 mL

## 2021-12-05 MED ORDER — HEPARIN SOD (PORK) LOCK FLUSH 100 UNIT/ML IV SOLN
500.0000 [IU] | Freq: Once | INTRAVENOUS | Status: AC | PRN
  Administered 2021-12-05: 500 [IU]

## 2021-12-05 MED ORDER — SODIUM CHLORIDE 0.9 % IV SOLN
6.0000 mg/kg | Freq: Once | INTRAVENOUS | Status: AC
  Administered 2021-12-05: 500 mg via INTRAVENOUS
  Filled 2021-12-05: qty 20

## 2021-12-05 NOTE — Progress Notes (Signed)
Lindsay [name]   Telephone:([phone] Fax:(336) [phone]   Clinic Follow up Note   Patient Care Team: [name], MD as PCP - General (Internal Medicine) [name], RN (Inactive) as Oncology Nurse Navigator [name], MD as Consulting Physician (Oncology) [name], MD as Consulting Physician (Gastroenterology)  Date of Service:  12/05/2021  CHIEF COMPLAINT: f/u of metastatic colon cancer  CURRENT THERAPY:  -Xeloda started 01/25/20  -current dose: 1500 mg BID days 1-7 q14d -Vectibix, q3weeks, started 02/06/20  ASSESSMENT & PLAN:  Lindsay [name] is a 66 y.o. female with   1. 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 MSS, [name]/[name] wildtype -Her 12/15/19 MRI abdomen showed 2 large liver masses and bulky porta hepatis and retroperitoneal adenopathy. -Her EUS from 12/22/19 showed 3.5cm LN in porta hepatis region and biopsy confirmed metastatic adenocarcinoma, insufficient tissue for IHC study but morphology does not support HCC. -Liver biopsy 01/08/20 confirmed adenocarcinoma, immunostain supporting primary colorectal cancer -PET scan showed: known liver metastasis; diffuse thoracic and abdominal adenopathy; hypermetabolic 1.8 cm pulmonary nodule in LUL; focal hypermetabolic lesion in splenic fixture of colon. -treated with first line FOLFOX 01/25/20 - 10/02/20, Vectibix added with cycle 2. Oxali stopped due to reaction. -Due to skin cracking and concern for 5-FU infusion reactions she was switched to Xeloda. Dose has been adjusted due to significant skin toxicity, currently 1500 mg BID 7 days on/7 days off. -restaging CT CAP on 10/13/21 showed overall stable disease. -she is tolerating Xeloda well overall at her current dose. Lab reviewed, overall stable, but WBC is trending down slowly. Her magnesium is actually WNL today. Will continue current therapy.   2. Chemo Toxicities: Skin and nail changes, hand-foot syndrome, hypomagnesia, acid  reflux -secondary to 5-FU and worse on Xeloda -tolerating better on 1500 mg 7 days on/7 days off -she is currently taking magnesium 800 mg QID.   3. Grade 1 neuropathy  -S/p cycle 5, oxaliplatin dose reduced and eventually discontinued after reaction 09/18/20 -Persistent numbness at the feet and hands, mild     PLAN: -proceed with vectibix today -continue Xeloda at same dose -lab, flush, and vectibix every 3 weeks, f/u in 6 weeks   No problem-specific Assessment & Plan notes found for this encounter.   SUMMARY OF ONCOLOGIC HISTORY: Oncology History Overview Note  Cancer Staging No matching staging information was found for the patient.    metastatic colon cancer to liver  06/20/2019 Procedure   Colonoscopy by Dr [name] 06/20/19  IMPRESSION -Seven 3 to 10 mm polyps in the sigmoid colon, in the transverse colon and in the escending colon, removed with a cold snare. Resected and retrireved.  -One 58m polyp in the descending colon. Biopsies. Tattoes.  -Mediaum sized lipoma in the ascending colon.   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 See Comment B. Colon, Ascending, polyp, Polypectomy:  -TUBULAR ADENOMA -No high grade dysplasia or malignancy.  C. Colon, TRansverse, Polyo, polectomy:  -TUBULAR ADENOMA -No high grade dysplasia or malignancy.  D. Colon, Sigmoid, Polyp, Polypectomy:  -HYPERPLASTIC POLYP   11/07/2019 Imaging   [place]domen 11/07/19  IMPRESSION: 1. Two solid masses in the liver are nonspecific. Recommend MRI abdomen with and without contrast for further evaluation.   12/15/2019 Imaging   MRI Abdomen 12/15/19  IMPRESSION: 1. There are two large masses in the liver with appearance favoring metastatic disease or hepatocellular carcinoma or cholangiocarcinoma. A benign etiology is highly unlikely  given the enhancement pattern and associated adenopathy. 2. Considerable porta  hepatis and retroperitoneal adenopathy. Some of the confluent porta hepatis tumor is potentially infiltrative and abuts the pancreatic body along its right upper margin, making it difficult to completely exclude the possibility of pancreatic adenocarcinoma primary. Possibilities helpful in further workup might include tissue diagnosis, endoscopic ultrasound, or nuclear medicine PET-CT. 3. Pancreas divisum. 4. Lumbar spondylosis and degenerative disc disease. 5. Despite efforts by the technologist and patient, motion artifact is present on today's exam and could not be eliminated. This reduces exam sensitivity and specificity.   12/22/2019 Procedure   Upper Endoscopy by Dr Benson Norway 12/22/19  IMPRESSION - One lymph node was visualized and measured in the porta hepatis region. Fine needle aspiration performed    12/22/2019 Initial Biopsy   A. LIVER, PORTA HEPATIS MASS, FINE NEEDLE 12/22/19 ASPIRATION:  FINAL MICROSCOPIC DIAGNOSIS:  - Malignant cells consistent with metastatic adenocarcinoma   01/01/2020 Initial Diagnosis   Intrahepatic cholangiocarcinoma ([place])   01/08/2020 Initial Biopsy   FINAL MICROSCOPIC DIAGNOSIS:   A. LIVER, LEFT LOBE, BIOPSY:  - Metastatic adenocarcinoma, consistent with a colorectal primary.  See  comment      COMMENT:   Immunohistochemical stains show the tumor cells are positive for CK20  and CDX2 but negative for CK7, consistent with above interpretation.  Dr. Burr Medico was notified on 01/10/2020   01/08/2020 Genetic Testing   [name]  [name] wildtype and [name]/[name] mutations which make her eligible for target biological agent Vectibix.   01/24/2020 Procedure   PAC placed 01/24/20   01/25/2020 -  Chemotherapy   first line FOLFOX starting 01/25/2020, Vextibix  added with C2 (02/06/20)   02/06/2020 -  Chemotherapy   Patient is on Treatment Plan : COLORECTAL Panitumumab q14d ([place] - Type Gene Only)     06/20/2020 Imaging   CT CAP   IMPRESSION: 1. Continued interval reduction in size and conspicuity of a subsolid nodule of the peripheral left upper lobe. 2. Unchanged prominent pretracheal and subcarinal lymph nodes. 3. Redemonstrated partially calcified low-attenuation liver masses, slightly decreased in size compared to prior examination. 4. Slight interval decrease in size of a portacaval lymph node or conglomerate and retroperitoneal lymph nodes. 5. Findings are consistent with continued treatment response of nodal, pulmonary, and hepatic metastatic disease. 6. Coronary artery disease.   Aortic Atherosclerosis (ICD10-I70.0).     10/09/2020 Imaging   IMPRESSION: 1. Slight decrease in size of dominant liver mass with smaller liver mass within 1-2 mm of prior measurement. 2. Stable appearance of celiac lymph and retroperitoneal lymph nodes. Dominant node with calcification in the gastrohepatic ligament as described. 3. Continued decrease in size of LEFT upper lobe nodule. 4. Three-vessel coronary artery calcification. 5. Aortic atherosclerosis.      INTERVAL HISTORY:  Lindsay [name] is here for a follow up of metastatic colon cancer. She was last seen by me on 11/13/21. She was seen in the infusion area. She reports she continues to tolerate treatment well. She notes her only complaints are skin dryness and peeling from Xeloda and some acne from vectibix.   All other systems were reviewed with the patient and are negative.  MEDICAL HISTORY:  Past Medical History:  Diagnosis Date   Arthritis    Diabetes mellitus without complication ([place])    Hypertension    met colon ca to liver 01/2020    SURGICAL HISTORY: Past Surgical History:  Procedure Laterality Date   ABDOMINAL HYSTERECTOMY     ANTERIOR AND  POSTERIOR REPAIR WITH SACROSPINOUS FIXATION N/A 07/16/2016   Procedure: ANTERIOR AND POSTERIOR REPAIR WITH SACROSPINOUS FIXATION;  Surgeon: Everlene Farrier, MD;  Location: [facility];  Service: Gynecology;   Laterality: N/A;   CYSTOSCOPY  07/16/2016   Procedure: CYSTOSCOPY;  Surgeon: Everlene Farrier, MD;  Location: [facility];  Service: Gynecology;;   ESOPHAGOGASTRODUODENOSCOPY (EGD) WITH PROPOFOL N/A 12/22/2019   Procedure: ESOPHAGOGASTRODUODENOSCOPY (EGD) WITH PROPOFOL;  Surgeon: [name], MD;  Location: [facility];  Service: Endoscopy;  Laterality: N/A;   FINE NEEDLE ASPIRATION N/A 12/22/2019   Procedure: FINE NEEDLE ASPIRATION (FNA) LINEAR;  Surgeon: [name], MD;  Location: [facility];  Service: Endoscopy;  Laterality: N/A;   IR IMAGING GUIDED PORT INSERTION  01/24/2020   LAPAROSCOPIC VAGINAL HYSTERECTOMY WITH SALPINGECTOMY Bilateral 07/16/2016   Procedure: LAPAROSCOPIC ASSISTED VAGINAL HYSTERECTOMY WITH SALPINGECTOMY;  Surgeon: Everlene Farrier, MD;  Location: [facility];  Service: Gynecology;  Laterality: Bilateral;   MYOMECTOMY ABDOMINAL APPROACH     THYROID SURGERY     tyroid     UPPER ESOPHAGEAL ENDOSCOPIC ULTRASOUND (EUS) N/A 12/22/2019   Procedure: UPPER ESOPHAGEAL ENDOSCOPIC ULTRASOUND (EUS);  Surgeon: [name], MD;  Location: [facility];  Service: Endoscopy;  Laterality: N/A;    I have reviewed the social history and family history with the patient and they are unchanged from previous note.  ALLERGIES:  is allergic to oxaliplatin.  MEDICATIONS:  Current Outpatient Medications  Medication Sig Dispense Refill   aspirin EC 81 MG tablet Take 1 tablet (81 mg total) by mouth daily. 90 tablet 3   atorvastatin (LIPITOR) 20 MG tablet Take 20 mg by mouth at bedtime.   1   capecitabine (XELODA) 500 MG tablet TAKE 3 TABLETS BY MOUTH 2 TIMES A DAY AFTER A MEAL FOR 7 DAYS ON, FOLLOWED BY 7 DAYS OFF. REPEAT EVERY 14 DAYS. 84 tablet 2   clindamycin (CLINDAGEL) 1 % gel Apply topically 2 (two) times daily. 60 g 2   clobetasol cream (TEMOVATE) 4.25 % Apply 1 application topically daily.     esomeprazole (NEXIUM) 40 MG capsule Take 40 mg by mouth daily.     hydrochlorothiazide (MICROZIDE)  12.5 MG capsule Take 12.5 mg by mouth daily.     KLOR-CON M20 20 MEQ tablet TAKE 1 TABLET TWICE A DAY 180 tablet 3   lidocaine-prilocaine (EMLA) cream Apply 1 application topically as needed. 30 g 1   magic mouthwash w/lidocaine SOLN Take 5 mLs by mouth 4 (four) times daily. 475 mL 2   magnesium oxide (MAG-OX) 400 (240 Mg) MG tablet Take 2 tablets by mouth 3 (three) times daily.     MAGNESIUM-OXIDE 400 (240 Mg) MG tablet TAKE 2 TABLETS BY MOUTH THREE TIMES DAILY 180 tablet 0   omeprazole (PRILOSEC) 20 MG capsule Take 1 capsule (20 mg total) by mouth daily. 30 capsule 2   ondansetron (ZOFRAN) 8 MG tablet TAKE 1 TABLET BY MOUTH EVERY 8 HOURS AS NEEDED FOR NAUSEA OR VOMITING 20 tablet 0   prochlorperazine (COMPAZINE) 10 MG tablet Take 1 tablet (10 mg total) by mouth every 6 (six) hours as needed for nausea or vomiting. 30 tablet 2   spironolactone (ALDACTONE) 25 MG tablet Take 25 mg by mouth daily with breakfast.     urea (CARMOL) 10 % cream Apply topically 2 (two) times daily. 71 g 2   valACYclovir (VALTREX) 1000 MG tablet Take 1 tablet (1,000 mg total) by mouth 2 (two) times daily. 10 tablet 0   verapamil (CALAN) 120 MG tablet Take  120 mg by mouth 3 (three) times daily.     No current facility-administered medications for this visit.   Facility-Administered Medications Ordered in Other Visits  Medication Dose Route Frequency Provider Last Rate Last Admin   heparin lock flush 100 unit/mL  500 Units Intracatheter Once PRN [name], MD       panitumumab (VECTIBIX) 500 mg in sodium chloride 0.9 % 100 mL chemo infusion  6 mg/kg (Order-Specific) Intravenous Once [name], MD       sodium chloride flush (NS) 0.9 % injection 10 mL  10 mL Intracatheter PRN [name], MD        PHYSICAL EXAMINATION: ECOG PERFORMANCE STATUS: 1 - Symptomatic but completely ambulatory  There were no vitals filed for this visit. Wt Readings from Last 3 Encounters:  12/05/21 182 lb (82.6 kg)  11/14/21 179 lb 4.8 oz  (81.3 kg)  10/03/21 180 lb 9 oz (81.9 kg)     GENERAL:alert, no distress and comfortable SKIN: skin color normal, no rashes or significant lesions EYES: normal, Conjunctiva are pink and non-injected, sclera clear  NEURO: alert & oriented x 3 with fluent speech  LABORATORY DATA:  I have reviewed the data as listed    Latest Ref Rng & Units 12/05/2021   10:11 AM 11/14/2021   10:44 AM 10/03/2021   10:42 AM  CBC  WBC 4.0 - 10.5 K/uL 3.9  4.1  5.0   Hemoglobin 12.0 - 15.0 g/dL 12.4  12.0  12.2   Hematocrit 36.0 - 46.0 % 36.8  35.4  35.6   Platelets 150 - 400 K/uL 207  209  231         Latest Ref Rng & Units 12/05/2021   10:11 AM 11/14/2021   10:44 AM 10/03/2021   10:42 AM  CMP  Glucose 70 - 99 mg/dL 87  116  107   BUN 8 - 23 mg/dL _0 Creatinine 0.44 - 1.00 mg/dL 0.49  0.62  0.64   Sodium 135 - 145 mmol/L 138  138  139   Potassium 3.5 - 5.1 mmol/L 3.9  3.8  3.6   Chloride 98 - 111 mmol/L 104  104  103   CO2 22 - 32 mmol/L _1 Calcium 8.9 - 10.3 mg/dL 9.5  9.8  9.6   Total Protein 6.5 - 8.1 g/dL 7.9  7.7  7.8   Total Bilirubin 0.3 - 1.2 mg/dL 0.4  0.3  0.4   Alkaline Phos 38 - 126 U/L 85  90  88   AST 15 - 41 U/L _2 ALT 0 - 44 U/L 16  30  37       RADIOGRAPHIC STUDIES: I have personally reviewed the radiological images as listed and agreed with the findings in the report. No results found.    No orders of the defined types were placed in this encounter.  All questions were answered. The patient knows to call the clinic with any problems, questions or concerns. No barriers to learning was detected. The total time spent in the appointment was 30 minutes.     [name], MD 12/05/2021   I, Wilburn Mylar, am acting as scribe for [name], MD.   I have reviewed the above documentation for accuracy and completeness, and I agree with the above.

## 2021-12-05 NOTE — Patient Instructions (Signed)
[facility]  Discharge Instructions: Thank you for choosing [facility] to provide your oncology and hematology care.   If you have a lab appointment with the [facility], please go directly to the [facility] and check in at the registration area.   Wear comfortable clothing and clothing appropriate for easy access to any Portacath or PICC line.   We strive to give you quality time with your provider. You may need to reschedule your appointment if you arrive late (15 or more minutes).  Arriving late affects you and other patients whose appointments are after yours.  Also, if you miss three or more appointments without notifying the office, you may be dismissed from the clinic at the provider's discretion.      For prescription refill requests, have your pharmacy contact our office and allow 72 hours for refills to be completed.    Today you received the following chemotherapy and/or immunotherapy agents: panitumumab (Vectibix)      To help prevent nausea and vomiting after your treatment, we encourage you to take your nausea medication as directed.  BELOW ARE SYMPTOMS THAT SHOULD BE REPORTED IMMEDIATELY: *FEVER GREATER THAN 100.4 F (38 C) OR HIGHER *CHILLS OR SWEATING *NAUSEA AND VOMITING THAT IS NOT CONTROLLED WITH YOUR NAUSEA MEDICATION *UNUSUAL SHORTNESS OF BREATH *UNUSUAL BRUISING OR BLEEDING *URINARY PROBLEMS (pain or burning when urinating, or frequent urination) *BOWEL PROBLEMS (unusual diarrhea, constipation, pain near the anus) TENDERNESS IN MOUTH AND THROAT WITH OR WITHOUT PRESENCE OF ULCERS (sore throat, sores in mouth, or a toothache) UNUSUAL RASH, SWELLING OR PAIN  UNUSUAL VAGINAL DISCHARGE OR ITCHING   Items with * indicate a potential emergency and should be followed up as soon as possible or go to the Emergency Department if any problems should occur.  Please show the CHEMOTHERAPY ALERT CARD or IMMUNOTHERAPY ALERT CARD at  check-in to the Emergency Department and triage nurse.  Should you have questions after your visit or need to cancel or reschedule your appointment, please contact [facility]  Dept: [phone]  and follow the prompts.  Office hours are 8:00 a.m. to 4:30 p.m. Monday - Friday. Please note that voicemails left after 4:00 p.m. may not be returned until the following business day.  We are closed weekends and major holidays. You have access to a nurse at all times for urgent questions. Please call the main number to the clinic Dept: [phone] and follow the prompts.   For any non-urgent questions, you may also contact your provider using MyChart. We now offer e-Visits for anyone 87 and older to request care online for non-urgent symptoms. For details visit [email or web address].   Also download the MyChart app! Go to the app store, search "MyChart", open the app, select [facility], and log in with your MyChart username and password.  Masks are optional in the cancer centers. If you would like for your care team to wear a mask while they are taking care of you, please let them know. You may have one support person who is at least 68 years old accompany you for your appointments.

## 2021-12-08 ENCOUNTER — Other Ambulatory Visit: Payer: Self-pay

## 2021-12-18 ENCOUNTER — Other Ambulatory Visit: Payer: Self-pay

## 2021-12-19 ENCOUNTER — Other Ambulatory Visit: Payer: Self-pay

## 2021-12-22 ENCOUNTER — Other Ambulatory Visit: Payer: Self-pay | Admitting: Hematology

## 2021-12-26 ENCOUNTER — Inpatient Hospital Stay: Payer: Medicare (Managed Care) | Attending: Hematology

## 2021-12-26 ENCOUNTER — Ambulatory Visit: Payer: Medicare (Managed Care) | Admitting: Physician Assistant

## 2021-12-26 ENCOUNTER — Other Ambulatory Visit: Payer: Self-pay

## 2021-12-26 ENCOUNTER — Inpatient Hospital Stay: Payer: Medicare (Managed Care)

## 2021-12-26 VITALS — BP 119/82 | HR 66 | Temp 98.2°F | Resp 16 | Ht 66.0 in | Wt 182.0 lb

## 2021-12-26 DIAGNOSIS — Z7189 Other specified counseling: Secondary | ICD-10-CM

## 2021-12-26 DIAGNOSIS — C7802 Secondary malignant neoplasm of left lung: Secondary | ICD-10-CM | POA: Diagnosis not present

## 2021-12-26 DIAGNOSIS — C221 Intrahepatic bile duct carcinoma: Secondary | ICD-10-CM

## 2021-12-26 DIAGNOSIS — K219 Gastro-esophageal reflux disease without esophagitis: Secondary | ICD-10-CM | POA: Diagnosis not present

## 2021-12-26 DIAGNOSIS — Z87891 Personal history of nicotine dependence: Secondary | ICD-10-CM | POA: Diagnosis not present

## 2021-12-26 DIAGNOSIS — E1140 Type 2 diabetes mellitus with diabetic neuropathy, unspecified: Secondary | ICD-10-CM | POA: Diagnosis not present

## 2021-12-26 DIAGNOSIS — Z86010 Personal history of colonic polyps: Secondary | ICD-10-CM | POA: Diagnosis not present

## 2021-12-26 DIAGNOSIS — C787 Secondary malignant neoplasm of liver and intrahepatic bile duct: Secondary | ICD-10-CM | POA: Insufficient documentation

## 2021-12-26 DIAGNOSIS — Z7984 Long term (current) use of oral hypoglycemic drugs: Secondary | ICD-10-CM | POA: Insufficient documentation

## 2021-12-26 DIAGNOSIS — Z5112 Encounter for antineoplastic immunotherapy: Secondary | ICD-10-CM | POA: Insufficient documentation

## 2021-12-26 DIAGNOSIS — R011 Cardiac murmur, unspecified: Secondary | ICD-10-CM | POA: Insufficient documentation

## 2021-12-26 DIAGNOSIS — C778 Secondary and unspecified malignant neoplasm of lymph nodes of multiple regions: Secondary | ICD-10-CM | POA: Diagnosis not present

## 2021-12-26 DIAGNOSIS — Z95828 Presence of other vascular implants and grafts: Secondary | ICD-10-CM

## 2021-12-26 DIAGNOSIS — C19 Malignant neoplasm of rectosigmoid junction: Secondary | ICD-10-CM | POA: Insufficient documentation

## 2021-12-26 DIAGNOSIS — C189 Malignant neoplasm of colon, unspecified: Secondary | ICD-10-CM

## 2021-12-26 LAB — CMP (CANCER CENTER ONLY)
ALT: 23 U/L (ref 0–44)
AST: 23 U/L (ref 15–41)
Albumin: 4.3 g/dL (ref 3.5–5.0)
Alkaline Phosphatase: 90 U/L (ref 38–126)
Anion gap: 5 (ref 5–15)
BUN: 10 mg/dL (ref 8–23)
CO2: 32 mmol/L (ref 22–32)
Calcium: 9.6 mg/dL (ref 8.9–10.3)
Chloride: 103 mmol/L (ref 98–111)
Creatinine: 0.56 mg/dL (ref 0.44–1.00)
GFR, Estimated: >60 mL/min (ref >60)
Glucose, Bld: 98 mg/dL (ref 70–99)
Potassium: 3.5 mmol/L (ref 3.5–5.1)
Sodium: 140 mmol/L (ref 135–145)
Total Bilirubin: 0.4 mg/dL (ref 0.3–1.2)
Total Protein: 7.7 g/dL (ref 6.5–8.1)

## 2021-12-26 LAB — CBC WITH DIFFERENTIAL (CANCER CENTER ONLY)
Abs Immature Granulocytes: 0.01 10*3/uL (ref 0.00–0.07)
Basophils Absolute: 0 10*3/uL (ref 0.0–0.1)
Basophils Relative: 0 %
Eosinophils Absolute: 0.1 10*3/uL (ref 0.0–0.5)
Eosinophils Relative: 3 %
HCT: 37.4 % (ref 36.0–46.0)
Hemoglobin: 12.7 g/dL (ref 12.0–15.0)
Immature Granulocytes: 0 %
Lymphocytes Relative: 25 %
Lymphs Abs: 1.3 10*3/uL (ref 0.7–4.0)
MCH: 32.2 pg (ref 26.0–34.0)
MCHC: 34 g/dL (ref 30.0–36.0)
MCV: 94.9 fL (ref 80.0–100.0)
Monocytes Absolute: 0.5 10*3/uL (ref 0.1–1.0)
Monocytes Relative: 10 %
Neutro Abs: 3.2 10*3/uL (ref 1.7–7.7)
Neutrophils Relative %: 62 %
Platelet Count: 210 10*3/uL (ref 150–400)
RBC: 3.94 MIL/uL (ref 3.87–5.11)
RDW: 16 % — ABNORMAL HIGH (ref 11.5–15.5)
WBC Count: 5.2 10*3/uL (ref 4.0–10.5)
nRBC: 0 % (ref 0.0–0.2)

## 2021-12-26 LAB — CEA (IN HOUSE-CHCC): CEA (CHCC-In House): 1.44 ng/mL (ref 0.00–5.00)

## 2021-12-26 LAB — MAGNESIUM: Magnesium: 1.6 mg/dL — ABNORMAL LOW (ref 1.7–2.4)

## 2021-12-26 MED ORDER — MAGNESIUM SULFATE 4 GM/100ML IV SOLN
4.0000 g | Freq: Once | INTRAVENOUS | Status: AC
  Administered 2021-12-26: 4 g via INTRAVENOUS
  Filled 2021-12-26: qty 100

## 2021-12-26 MED ORDER — SODIUM CHLORIDE 0.9 % IV SOLN: Freq: Once | INTRAVENOUS | Status: AC

## 2021-12-26 MED ORDER — SODIUM CHLORIDE 0.9 % IV SOLN
6.0000 mg/kg | Freq: Once | INTRAVENOUS | Status: AC
  Administered 2021-12-26: 500 mg via INTRAVENOUS
  Filled 2021-12-26: qty 20

## 2021-12-26 MED ORDER — HEPARIN SOD (PORK) LOCK FLUSH 100 UNIT/ML IV SOLN
500.0000 [IU] | Freq: Once | INTRAVENOUS | Status: AC | PRN
  Administered 2021-12-26: 500 [IU]

## 2021-12-26 MED ORDER — SODIUM CHLORIDE 0.9% FLUSH
10.0000 mL | Freq: Once | INTRAVENOUS | Status: AC
  Administered 2021-12-26: 10 mL

## 2021-12-26 MED ORDER — SODIUM CHLORIDE 0.9% FLUSH
10.0000 mL | INTRAVENOUS | Status: DC | PRN
  Administered 2021-12-26: 10 mL

## 2021-12-26 NOTE — Patient Instructions (Signed)
[facility]COL[facility]   Discharge Instructions: Thank you for choosing [facility] to provide your oncology and hematology care.   If you have a lab appointment with the Cancer Center, please go directly to the Cancer Center and check in at the registration area.   Wear comfortable clothing and clothing appropriate for easy access to any Portacath or PICC line.   We strive to give you quality time with your provider. You may need to reschedule your appointment if you arrive late (15 or more minutes).  Arriving late affects you and other patients whose appointments are after yours.  Also, if you miss three or more appointments without notifying the office, you may be dismissed from the clinic at the provider's discretion.      For prescription refill requests, have your pharmacy contact our office and allow 72 hours for refills to be completed.    Today you received the following chemotherapy and/or immunotherapy agents: Panitumumab (Vectibix)      To help prevent nausea and vomiting after your treatment, we encourage you to take your nausea medication as directed.  BELOW ARE SYMPTOMS THAT SHOULD BE REPORTED IMMEDIATELY: *FEVER GREATER THAN 100.4 F (38 C) OR HIGHER *CHILLS OR SWEATING *NAUSEA AND VOMITING THAT IS NOT CONTROLLED WITH YOUR NAUSEA MEDICATION *UNUSUAL SHORTNESS OF BREATH *UNUSUAL BRUISING OR BLEEDING *URINARY PROBLEMS (pain or burning when urinating, or frequent urination) *BOWEL PROBLEMS (unusual diarrhea, constipation, pain near the anus) TENDERNESS IN MOUTH AND THROAT WITH OR WITHOUT PRESENCE OF ULCERS (sore throat, sores in mouth, or a toothache) UNUSUAL RASH, SWELLING OR PAIN  UNUSUAL VAGINAL DISCHARGE OR ITCHING   Items with * indicate a potential emergency and should be followed up as soon as possible or go to the Emergency Department if any problems should occur.  Please show the CHEMOTHERAPY ALERT CARD or IMMUNOTHERAPY ALERT CARD  at check-in to the Emergency Department and triage nurse.  Should you have questions after your visit or need to cancel or reschedule your appointment, please contact [facility]  Dept: [phone]  and follow the prompts.  Office hours are 8:00 a.m. to 4:30 p.m. Monday - Friday. Please note that voicemails left after 4:00 p.m. may not be returned until the following business day.  We are closed weekends and major holidays. You have access to a nurse at all times for urgent questions. Please call the main number to the clinic Dept: [phone] and follow the prompts.   For any non-urgent questions, you may also contact your provider using MyChart. We now offer e-Visits for anyone 18 and older to request care online for non-urgent symptoms. For details visit [email or web address].   Also download the MyChart app! Go to the app store, search "MyChart", open the app, select [facility], and log in with your MyChart username and password.  Masks are optional in the cancer centers. If you would like for your care team to wear a mask while they are taking care of you, please let them know. You may have one support person who is at least 68 years old accompany you for your appointments. 

## 2022-01-13 ENCOUNTER — Other Ambulatory Visit: Payer: Self-pay | Admitting: Hematology

## 2022-01-13 DIAGNOSIS — C189 Malignant neoplasm of colon, unspecified: Secondary | ICD-10-CM

## 2022-01-14 ENCOUNTER — Other Ambulatory Visit: Payer: Self-pay

## 2022-01-16 ENCOUNTER — Inpatient Hospital Stay (HOSPITAL_BASED_OUTPATIENT_CLINIC_OR_DEPARTMENT_OTHER): Payer: Medicare (Managed Care) | Admitting: Hematology

## 2022-01-16 ENCOUNTER — Inpatient Hospital Stay: Payer: Medicare (Managed Care) | Attending: Hematology

## 2022-01-16 ENCOUNTER — Encounter: Payer: Self-pay | Admitting: Hematology

## 2022-01-16 ENCOUNTER — Inpatient Hospital Stay: Payer: Medicare (Managed Care)

## 2022-01-16 ENCOUNTER — Other Ambulatory Visit: Payer: Self-pay

## 2022-01-16 VITALS — BP 129/76 | HR 59 | Temp 98.9°F | Resp 17

## 2022-01-16 VITALS — BP 127/88 | HR 57 | Temp 98.3°F | Resp 18 | Ht 66.0 in | Wt 180.7 lb

## 2022-01-16 DIAGNOSIS — Z7982 Long term (current) use of aspirin: Secondary | ICD-10-CM | POA: Diagnosis not present

## 2022-01-16 DIAGNOSIS — C221 Intrahepatic bile duct carcinoma: Secondary | ICD-10-CM

## 2022-01-16 DIAGNOSIS — I10 Essential (primary) hypertension: Secondary | ICD-10-CM | POA: Diagnosis not present

## 2022-01-16 DIAGNOSIS — E119 Type 2 diabetes mellitus without complications: Secondary | ICD-10-CM | POA: Insufficient documentation

## 2022-01-16 DIAGNOSIS — C7800 Secondary malignant neoplasm of unspecified lung: Secondary | ICD-10-CM | POA: Diagnosis not present

## 2022-01-16 DIAGNOSIS — Z79899 Other long term (current) drug therapy: Secondary | ICD-10-CM | POA: Insufficient documentation

## 2022-01-16 DIAGNOSIS — R197 Diarrhea, unspecified: Secondary | ICD-10-CM | POA: Diagnosis not present

## 2022-01-16 DIAGNOSIS — R21 Rash and other nonspecific skin eruption: Secondary | ICD-10-CM | POA: Diagnosis not present

## 2022-01-16 DIAGNOSIS — Z7189 Other specified counseling: Secondary | ICD-10-CM

## 2022-01-16 DIAGNOSIS — Z95828 Presence of other vascular implants and grafts: Secondary | ICD-10-CM

## 2022-01-16 DIAGNOSIS — Z5112 Encounter for antineoplastic immunotherapy: Secondary | ICD-10-CM | POA: Diagnosis present

## 2022-01-16 DIAGNOSIS — C787 Secondary malignant neoplasm of liver and intrahepatic bile duct: Secondary | ICD-10-CM | POA: Insufficient documentation

## 2022-01-16 DIAGNOSIS — C189 Malignant neoplasm of colon, unspecified: Secondary | ICD-10-CM

## 2022-01-16 DIAGNOSIS — Z79624 Long term (current) use of inhibitors of nucleotide synthesis: Secondary | ICD-10-CM | POA: Insufficient documentation

## 2022-01-16 DIAGNOSIS — C19 Malignant neoplasm of rectosigmoid junction: Secondary | ICD-10-CM | POA: Diagnosis present

## 2022-01-16 LAB — MAGNESIUM: Magnesium: 1.8 mg/dL (ref 1.7–2.4)

## 2022-01-16 LAB — CEA (IN HOUSE-CHCC): CEA (CHCC-In House): 1.45 ng/mL (ref 0.00–5.00)

## 2022-01-16 MED ORDER — HEPARIN SOD (PORK) LOCK FLUSH 100 UNIT/ML IV SOLN
500.0000 [IU] | Freq: Once | INTRAVENOUS | Status: AC | PRN
  Administered 2022-01-16: 500 [IU]

## 2022-01-16 MED ORDER — MAGNESIUM OXIDE -MG SUPPLEMENT 400 (240 MG) MG PO TABS: 2.0000 | ORAL_TABLET | Freq: Four times a day (QID) | ORAL | 3 refills | Status: DC

## 2022-01-16 MED ORDER — PROCHLORPERAZINE MALEATE 10 MG PO TABS: 10.0000 mg | ORAL_TABLET | Freq: Four times a day (QID) | ORAL | 2 refills | Status: DC | PRN

## 2022-01-16 MED ORDER — SODIUM CHLORIDE 0.9 % IV SOLN
6.0000 mg/kg | Freq: Once | INTRAVENOUS | Status: AC
  Administered 2022-01-16: 500 mg via INTRAVENOUS
  Filled 2022-01-16: qty 20

## 2022-01-16 MED ORDER — SODIUM CHLORIDE 0.9% FLUSH
10.0000 mL | INTRAVENOUS | Status: DC | PRN
  Administered 2022-01-16: 10 mL

## 2022-01-16 MED ORDER — SODIUM CHLORIDE 0.9% FLUSH
10.0000 mL | Freq: Once | INTRAVENOUS | Status: AC
  Administered 2022-01-16: 10 mL

## 2022-01-16 MED ORDER — SODIUM CHLORIDE 0.9 % IV SOLN: Freq: Once | INTRAVENOUS | Status: AC

## 2022-01-16 MED ORDER — MAGNESIUM SULFATE 4 GM/100ML IV SOLN
4.0000 g | Freq: Once | INTRAVENOUS | Status: AC
  Administered 2022-01-16: 4 g via INTRAVENOUS
  Filled 2022-01-16: qty 100

## 2022-01-16 NOTE — Progress Notes (Signed)
[facility]   [phone] [phone]   Clinic Follow up Note   Patient Care Team: [name], MD as PCP - General (Internal Medicine) [name], RN (Inactive) as Oncology Nurse Navigator [name], MD as Consulting Physician (Oncology) [name], MD as Consulting Physician (Gastroenterology)  Date of Service:  01/16/2022  CHIEF COMPLAINT: f/u of metastatic colon cancer  CURRENT THERAPY:  -Xeloda started 10/21/20  -current dose: 1500 mg BID days 1-7 q14d -Vectibix, q3weeks, started 02/06/20  ASSESSMENT & PLAN:  Lindsay [name] is a 66 y.o. female with   1. 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 MSS, [name]/[name] wildtype -diagnosed 12/2019 by porta hepatis LN biopsy during EUS for work up of abdominal pain and liver lesions seen on [place] and MRI. Liver biopsy 01/08/20 confirmed metastasis from primary colorectal cancer. PET scan showed hypermetabolism to known liver mets, diffuse thoracic and abdominal lymphadenopathy, a 1.8 cm LUL pulmonary nodule, and splenic flexure of colon. -treated with first line FOLFOX 01/25/20 - 10/02/20, Vectibix added with C2. Oxali discontinued after C18 due to reaction. -switched to Xeloda 10/21/20, dose adjusted due to significant skin toxicity, currently 1500 mg BID 7 days on/7 days off. -restaging CT CAP on 10/13/21 showed overall stable disease. -she is tolerating Xeloda well overall at her current dose with stable skin toxicities. Labs reviewed, magnesium improved to 1.8. Will continue current therapy. -restaging CT in Jan    2. Chemo Toxicities: Skin and nail changes, hand-foot syndrome, hypomagnesia, acid reflux -secondary to 5-FU and worse on Xeloda -tolerating better on 1500 mg 7 days on/7 days off -she is currently taking magnesium 800 mg QID.   3. Grade 1 neuropathy  -S/p cycle 5, oxaliplatin dose reduced and eventually discontinued after reaction 09/18/20 -Persistent numbness at the feet  and hands, mild     PLAN: -proceed with vectibix today -continue Xeloda at same dose -lab, flush, and vectibix every 3 weeks, f/u in 6 weeks   No problem-specific Assessment & Plan notes found for this encounter.   SUMMARY OF ONCOLOGIC HISTORY: Oncology History Overview Note  Cancer Staging No matching staging information was found for the patient.    metastatic colon cancer to liver  06/20/2019 Procedure   Colonoscopy by Dr [name] 06/20/19  IMPRESSION -Seven 3 to 10 mm polyps in the sigmoid colon, in the transverse colon and in the escending colon, removed with a cold snare. Resected and retrireved.  -One 37m polyp in the descending colon. Biopsies. Tattoes.  -Mediaum sized lipoma in the ascending colon.   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 See Comment B. Colon, Ascending, polyp, Polypectomy:  -TUBULAR ADENOMA -No high grade dysplasia or malignancy.  C. Colon, TRansverse, Polyo, polectomy:  -TUBULAR ADENOMA -No high grade dysplasia or malignancy.  D. Colon, Sigmoid, Polyp, Polypectomy:  -HYPERPLASTIC POLYP   11/07/2019 Imaging   [place]dom[place] 11/07/19  IMPRESSION: 1. Two solid masses in the liver are nonspecific. Recommend MRI abdomen with and without contrast for further evaluation.   12/15/2019 Imaging   MRI Abdomen 12/15/19  IMPRESSION: 1. There are two large masses in the liver with appearance favoring metastatic disease or hepatocellular carcinoma or cholangiocarcinoma. A benign etiology is highly unlikely given the enhancement pattern and associated adenopathy. 2. Considerable porta hepatis and retroperitoneal adenopathy. Some of the confluent porta hepatis tumor is potentially infiltrative and abuts the pancreatic body along its right upper margin, making it  difficult to completely exclude the possibility of pancreatic adenocarcinoma primary. Possibilities helpful in  further workup might include tissue diagnosis, endoscopic ultrasound, or nuclear medicine PET-CT. 3. Pancreas divisum. 4. Lumbar spondylosis and degenerative disc disease. 5. Despite efforts by the technologist and patient, motion artifact is present on today's exam and could not be eliminated. This reduces exam sensitivity and specificity.   12/22/2019 Procedure   Upper Endoscopy by Dr Benson Norway 12/22/19  IMPRESSION - One lymph node was visualized and measured in the porta hepatis region. Fine needle aspiration performed    12/22/2019 Initial Biopsy   A. LIVER, PORTA HEPATIS MASS, FINE NEEDLE 12/22/19 ASPIRATION:  FINAL MICROSCOPIC DIAGNOSIS:  - Malignant cells consistent with metastatic adenocarcinoma   01/01/2020 Initial Diagnosis   Intrahepatic cholangiocarcinoma ([place])   01/08/2020 Initial Biopsy   FINAL MICROSCOPIC DIAGNOSIS:   A. LIVER, LEFT LOBE, BIOPSY:  - Metastatic adenocarcinoma, consistent with a colorectal primary.  See  comment      COMMENT:   Immunohistochemical stains show the tumor cells are positive for CK20  and CDX2 but negative for CK7, consistent with above interpretation.  Dr. Burr Medico was notified on 01/10/2020   01/08/2020 Genetic Testing   [name]  [name] wildtype and [name]/[name] mutations which make her eligible for target biological agent Vectibix.   01/24/2020 Procedure   PAC placed 01/24/20   01/25/2020 -  Chemotherapy   first line FOLFOX starting 01/25/2020, Vextibix  added with C2 (02/06/20)   02/06/2020 -  Chemotherapy   Patient is on Treatment Plan : COLORECTAL Panitumumab q14d ([place] - Type Gene Only)     06/20/2020 Imaging   CT CAP  IMPRESSION: 1. Continued interval reduction in size and conspicuity of a subsolid nodule of the peripheral left upper lobe. 2. Unchanged prominent pretracheal and subcarinal lymph nodes. 3. Redemonstrated partially calcified low-attenuation liver masses, slightly decreased in size compared to prior  examination. 4. Slight interval decrease in size of a portacaval lymph node or conglomerate and retroperitoneal lymph nodes. 5. Findings are consistent with continued treatment response of nodal, pulmonary, and hepatic metastatic disease. 6. Coronary artery disease.   Aortic Atherosclerosis (ICD10-I70.0).     10/09/2020 Imaging   IMPRESSION: 1. Slight decrease in size of dominant liver mass with smaller liver mass within 1-2 mm of prior measurement. 2. Stable appearance of celiac lymph and retroperitoneal lymph nodes. Dominant node with calcification in the gastrohepatic ligament as described. 3. Continued decrease in size of LEFT upper lobe nodule. 4. Three-vessel coronary artery calcification. 5. Aortic atherosclerosis.      INTERVAL HISTORY:  Lindsay [name] is here for a follow up of metastatic colon cancer. She was last seen by me on 12/05/21. She presents to the clinic alone. She reports overall stable rash and cracking to her hands. She also reports diarrhea.   All other systems were reviewed with the patient and are negative.  MEDICAL HISTORY:  Past Medical History:  Diagnosis Date   Arthritis    Diabetes mellitus without complication ([place])    Hypertension    met colon ca to liver 01/2020    SURGICAL HISTORY: Past Surgical History:  Procedure Laterality Date   ABDOMINAL HYSTERECTOMY     ANTERIOR AND POSTERIOR REPAIR WITH SACROSPINOUS FIXATION N/A 07/16/2016   Procedure: ANTERIOR AND POSTERIOR REPAIR WITH SACROSPINOUS FIXATION;  Surgeon: Everlene Farrier, MD;  Location: [facility];  Service: Gynecology;  Laterality: N/A;   CYSTOSCOPY  07/16/2016   Procedure: CYSTOSCOPY;  Surgeon: Everlene Farrier, MD;  Location:  [facility];  Service: Gynecology;;   ESOPHAGOGASTRODUODENOSCOPY (EGD) WITH PROPOFOL N/A 12/22/2019   Procedure: ESOPHAGOGASTRODUODENOSCOPY (EGD) WITH PROPOFOL;  Surgeon: [name], MD;  Location: [facility];  Service: Endoscopy;  Laterality: N/A;   FINE NEEDLE  ASPIRATION N/A 12/22/2019   Procedure: FINE NEEDLE ASPIRATION (FNA) LINEAR;  Surgeon: [name], MD;  Location: [facility];  Service: Endoscopy;  Laterality: N/A;   IR IMAGING GUIDED PORT INSERTION  01/24/2020   LAPAROSCOPIC VAGINAL HYSTERECTOMY WITH SALPINGECTOMY Bilateral 07/16/2016   Procedure: LAPAROSCOPIC ASSISTED VAGINAL HYSTERECTOMY WITH SALPINGECTOMY;  Surgeon: Everlene Farrier, MD;  Location: [facility];  Service: Gynecology;  Laterality: Bilateral;   MYOMECTOMY ABDOMINAL APPROACH     THYROID SURGERY     tyroid     UPPER ESOPHAGEAL ENDOSCOPIC ULTRASOUND (EUS) N/A 12/22/2019   Procedure: UPPER ESOPHAGEAL ENDOSCOPIC ULTRASOUND (EUS);  Surgeon: [name], MD;  Location: [facility];  Service: Endoscopy;  Laterality: N/A;    I have reviewed the social history and family history with the patient and they are unchanged from previous note.  ALLERGIES:  is allergic to oxaliplatin.  MEDICATIONS:  Current Outpatient Medications  Medication Sig Dispense Refill   aspirin EC 81 MG tablet Take 1 tablet (81 mg total) by mouth daily. 90 tablet 3   atorvastatin (LIPITOR) 20 MG tablet Take 20 mg by mouth at bedtime.   1   capecitabine (XELODA) 500 MG tablet TAKE 3 TABLETS BY MOUTH 2 TIMES A DAY AFTER A MEAL FOR 7 DAYS ON, FOLLOWED BY 7 DAYS OFF. REPEAT EVERY 14 DAYS. 84 tablet 1   clindamycin (CLINDAGEL) 1 % gel Apply topically 2 (two) times daily. 60 g 2   clobetasol cream (TEMOVATE) 5.00 % Apply 1 application topically daily.     esomeprazole (NEXIUM) 40 MG capsule Take 40 mg by mouth daily.     hydrochlorothiazide (MICROZIDE) 12.5 MG capsule Take 12.5 mg by mouth daily.     KLOR-CON M20 20 MEQ tablet TAKE 1 TABLET TWICE A DAY 180 tablet 3   lidocaine-prilocaine (EMLA) cream Apply 1 application topically as needed. 30 g 1   magic mouthwash w/lidocaine SOLN Take 5 mLs by mouth 4 (four) times daily. 475 mL 2   magnesium oxide (MAG-OX) 400 (240 Mg) MG tablet Take 2 tablets (800 mg total) by  mouth 4 (four) times daily. 240 tablet 3   MAGNESIUM-OXIDE 400 (240 Mg) MG tablet TAKE 2 TABLETS BY MOUTH THREE TIMES DAILY 180 tablet 0   omeprazole (PRILOSEC) 20 MG capsule Take 1 capsule (20 mg total) by mouth daily. 30 capsule 2   ondansetron (ZOFRAN) 8 MG tablet TAKE 1 TABLET BY MOUTH EVERY 8 HOURS AS NEEDED FOR NAUSEA OR VOMITING 20 tablet 0   prochlorperazine (COMPAZINE) 10 MG tablet Take 1 tablet (10 mg total) by mouth every 6 (six) hours as needed for nausea or vomiting. 30 tablet 2   spironolactone (ALDACTONE) 25 MG tablet Take 25 mg by mouth daily with breakfast.     urea (CARMOL) 10 % cream Apply topically 2 (two) times daily. 71 g 2   valACYclovir (VALTREX) 1000 MG tablet Take 1 tablet (1,000 mg total) by mouth 2 (two) times daily. 10 tablet 0   verapamil (CALAN) 120 MG tablet Take 120 mg by mouth 3 (three) times daily.     No current facility-administered medications for this visit.    PHYSICAL EXAMINATION: ECOG PERFORMANCE STATUS: 1 - Symptomatic but completely ambulatory  Vitals:   01/16/22 0915  BP: 127/88  Pulse: Marland Kitchen)  57  Resp: 18  Temp: 98.3 F (36.8 C)  SpO2: 100%   Wt Readings from Last 3 Encounters:  01/16/22 180 lb 11.2 oz (82 kg)  12/26/21 182 lb (82.6 kg)  12/05/21 182 lb (82.6 kg)     GENERAL:alert, no distress and comfortable SKIN: skin color normal, no rashes or significant lesions EYES: normal, Conjunctiva are pink and non-injected, sclera clear  NEURO: alert & oriented x 3 with fluent speech  LABORATORY DATA:  I have reviewed the data as listed    Latest Ref Rng & Units 12/26/2021   10:52 AM 12/05/2021   10:11 AM 11/14/2021   10:44 AM  CBC  WBC 4.0 - 10.5 K/uL 5.2  3.9  4.1   Hemoglobin 12.0 - 15.0 g/dL 12.7  12.4  12.0   Hematocrit 36.0 - 46.0 % 37.4  36.8  35.4   Platelets 150 - 400 K/uL 210  207  209         Latest Ref Rng & Units 12/26/2021   10:52 AM 12/05/2021   10:11 AM 11/14/2021   10:44 AM  CMP  Glucose 70 - 99 mg/dL 98  87  116    BUN 8 - 23 mg/dL _0 Creatinine 0.44 - 1.00 mg/dL 0.56  0.49  0.62   Sodium 135 - 145 mmol/L 140  138  138   Potassium 3.5 - 5.1 mmol/L 3.5  3.9  3.8   Chloride 98 - 111 mmol/L 103  104  104   CO2 22 - 32 mmol/L 32  30  28   Calcium 8.9 - 10.3 mg/dL 9.6  9.5  9.8   Total Protein 6.5 - 8.1 g/dL 7.7  7.9  7.7   Total Bilirubin 0.3 - 1.2 mg/dL 0.4  0.4  0.3   Alkaline Phos 38 - 126 U/L 90  85  90   AST 15 - 41 U/L _1 ALT 0 - 44 U/L _2 RADIOGRAPHIC STUDIES: I have personally reviewed the radiological images as listed and agreed with the findings in the report. No results found.    Orders Placed This Encounter  Procedures   CBC with Differential/Platelet    Standing Status:   Standing    Number of Occurrences:   50    Standing Expiration Date:   01/17/2023   Comprehensive metabolic panel    Standing Status:   Standing    Number of Occurrences:   50    Standing Expiration Date:   01/17/2023   Magnesium    Standing Status:   Standing    Number of Occurrences:   20    Standing Expiration Date:   01/17/2023   Magnesium    Standing Status:   Future    Standing Expiration Date:   02/07/2023   Magnesium    Standing Status:   Future    Standing Expiration Date:   02/28/2023   All questions were answered. The patient knows to call the clinic with any problems, questions or concerns. No barriers to learning was detected. The total time spent in the appointment was 30 minutes.     [name], MD 01/16/2022   I, Wilburn Mylar, am acting as scribe for [name], MD.   I have reviewed the above documentation for accuracy and completeness, and I agree with the above.

## 2022-01-16 NOTE — Patient Instructions (Signed)
[facility]   Discharge Instructions: Thank you for choosing [facility] to provide your oncology and hematology care.   If you have a lab appointment with the [facility], please go directly to the [facility] and check in at the registration area.   Wear comfortable clothing and clothing appropriate for easy access to any Portacath or PICC line.   We strive to give you quality time with your provider. You may need to reschedule your appointment if you arrive late (15 or more minutes).  Arriving late affects you and other patients whose appointments are after yours.  Also, if you miss three or more appointments without notifying the office, you may be dismissed from the clinic at the provider's discretion.      For prescription refill requests, have your pharmacy contact our office and allow 72 hours for refills to be completed.    Today you received the following chemotherapy and/or immunotherapy agents: Panitumumab (Vectibix)      To help prevent nausea and vomiting after your treatment, we encourage you to take your nausea medication as directed.  BELOW ARE SYMPTOMS THAT SHOULD BE REPORTED IMMEDIATELY: *FEVER GREATER THAN 100.4 F (38 C) OR HIGHER *CHILLS OR SWEATING *NAUSEA AND VOMITING THAT IS NOT CONTROLLED WITH YOUR NAUSEA MEDICATION *UNUSUAL SHORTNESS OF BREATH *UNUSUAL BRUISING OR BLEEDING *URINARY PROBLEMS (pain or burning when urinating, or frequent urination) *BOWEL PROBLEMS (unusual diarrhea, constipation, pain near the anus) TENDERNESS IN MOUTH AND THROAT WITH OR WITHOUT PRESENCE OF ULCERS (sore throat, sores in mouth, or a toothache) UNUSUAL RASH, SWELLING OR PAIN  UNUSUAL VAGINAL DISCHARGE OR ITCHING   Items with * indicate a potential emergency and should be followed up as soon as possible or go to the Emergency Department if any problems should occur.  Please show the CHEMOTHERAPY ALERT CARD or IMMUNOTHERAPY ALERT CARD  at check-in to the Emergency Department and triage nurse.  Should you have questions after your visit or need to cancel or reschedule your appointment, please contact [facility]  Dept: [phone]  and follow the prompts.  Office hours are 8:00 a.m. to 4:30 p.m. Monday - Friday. Please note that voicemails left after 4:00 p.m. may not be returned until the following business day.  We are closed weekends and major holidays. You have access to a nurse at all times for urgent questions. Please call the main number to the clinic Dept: [phone] and follow the prompts.   For any non-urgent questions, you may also contact your provider using MyChart. We now offer e-Visits for anyone 17 and older to request care online for non-urgent symptoms. For details visit [email or web address].   Also download the MyChart app! Go to the app store, search "MyChart", open the app, select [facility], and log in with your MyChart username and password.  Masks are optional in the cancer centers. If you would like for your care team to wear a mask while they are taking care of you, please let them know. You may have one support person who is at least 68 years old accompany you for your appointments.

## 2022-01-19 ENCOUNTER — Telehealth: Payer: Self-pay | Admitting: Hematology

## 2022-01-19 NOTE — Telephone Encounter (Signed)
Left patient a voicemail regarding upcoming appointments  

## 2022-01-20 ENCOUNTER — Other Ambulatory Visit: Payer: Self-pay

## 2022-02-05 ENCOUNTER — Telehealth: Payer: Self-pay | Admitting: Hematology

## 2022-02-05 ENCOUNTER — Other Ambulatory Visit: Payer: Self-pay

## 2022-02-05 NOTE — Telephone Encounter (Signed)
Left patient a voicemail regarding 12/1 appointments

## 2022-02-06 ENCOUNTER — Inpatient Hospital Stay: Payer: Medicare (Managed Care)

## 2022-02-06 ENCOUNTER — Ambulatory Visit: Payer: Medicare (Managed Care)

## 2022-02-06 ENCOUNTER — Other Ambulatory Visit: Payer: Self-pay

## 2022-02-06 ENCOUNTER — Inpatient Hospital Stay: Payer: Medicare (Managed Care) | Attending: Hematology

## 2022-02-06 ENCOUNTER — Other Ambulatory Visit: Payer: Medicare (Managed Care)

## 2022-02-06 VITALS — BP 148/84 | HR 83 | Temp 98.5°F | Resp 17 | Wt 180.0 lb

## 2022-02-06 DIAGNOSIS — C7800 Secondary malignant neoplasm of unspecified lung: Secondary | ICD-10-CM | POA: Insufficient documentation

## 2022-02-06 DIAGNOSIS — Z79624 Long term (current) use of inhibitors of nucleotide synthesis: Secondary | ICD-10-CM | POA: Diagnosis not present

## 2022-02-06 DIAGNOSIS — Z7189 Other specified counseling: Secondary | ICD-10-CM

## 2022-02-06 DIAGNOSIS — I10 Essential (primary) hypertension: Secondary | ICD-10-CM | POA: Diagnosis not present

## 2022-02-06 DIAGNOSIS — Z79899 Other long term (current) drug therapy: Secondary | ICD-10-CM | POA: Diagnosis not present

## 2022-02-06 DIAGNOSIS — C221 Intrahepatic bile duct carcinoma: Secondary | ICD-10-CM

## 2022-02-06 DIAGNOSIS — R21 Rash and other nonspecific skin eruption: Secondary | ICD-10-CM | POA: Diagnosis not present

## 2022-02-06 DIAGNOSIS — Z5112 Encounter for antineoplastic immunotherapy: Secondary | ICD-10-CM | POA: Insufficient documentation

## 2022-02-06 DIAGNOSIS — R197 Diarrhea, unspecified: Secondary | ICD-10-CM | POA: Insufficient documentation

## 2022-02-06 DIAGNOSIS — C19 Malignant neoplasm of rectosigmoid junction: Secondary | ICD-10-CM | POA: Insufficient documentation

## 2022-02-06 DIAGNOSIS — Z7982 Long term (current) use of aspirin: Secondary | ICD-10-CM | POA: Diagnosis not present

## 2022-02-06 DIAGNOSIS — T451X5D Adverse effect of antineoplastic and immunosuppressive drugs, subsequent encounter: Secondary | ICD-10-CM | POA: Insufficient documentation

## 2022-02-06 DIAGNOSIS — E119 Type 2 diabetes mellitus without complications: Secondary | ICD-10-CM | POA: Insufficient documentation

## 2022-02-06 DIAGNOSIS — C189 Malignant neoplasm of colon, unspecified: Secondary | ICD-10-CM

## 2022-02-06 DIAGNOSIS — G620 Drug-induced polyneuropathy: Secondary | ICD-10-CM | POA: Insufficient documentation

## 2022-02-06 DIAGNOSIS — C787 Secondary malignant neoplasm of liver and intrahepatic bile duct: Secondary | ICD-10-CM | POA: Insufficient documentation

## 2022-02-06 DIAGNOSIS — Z95828 Presence of other vascular implants and grafts: Secondary | ICD-10-CM

## 2022-02-06 LAB — CBC WITH DIFFERENTIAL/PLATELET
Abs Immature Granulocytes: 0.01 10*3/uL (ref 0.00–0.07)
Basophils Absolute: 0 10*3/uL (ref 0.0–0.1)
Basophils Relative: 1 %
Eosinophils Absolute: 0.1 10*3/uL (ref 0.0–0.5)
Eosinophils Relative: 1 %
HCT: 36.2 % (ref 36.0–46.0)
Hemoglobin: 12.2 g/dL (ref 12.0–15.0)
Immature Granulocytes: 0 %
Lymphocytes Relative: 22 %
Lymphs Abs: 1.3 10*3/uL (ref 0.7–4.0)
MCH: 32 pg (ref 26.0–34.0)
MCHC: 33.7 g/dL (ref 30.0–36.0)
MCV: 95 fL (ref 80.0–100.0)
Monocytes Absolute: 0.5 10*3/uL (ref 0.1–1.0)
Monocytes Relative: 8 %
Neutro Abs: 4.1 10*3/uL (ref 1.7–7.7)
Neutrophils Relative %: 68 %
Platelets: 233 10*3/uL (ref 150–400)
RBC: 3.81 MIL/uL — ABNORMAL LOW (ref 3.87–5.11)
RDW: 15.7 % — ABNORMAL HIGH (ref 11.5–15.5)
WBC: 6 10*3/uL (ref 4.0–10.5)
nRBC: 0 % (ref 0.0–0.2)

## 2022-02-06 LAB — COMPREHENSIVE METABOLIC PANEL
ALT: 23 U/L (ref 0–44)
AST: 22 U/L (ref 15–41)
Albumin: 4.3 g/dL (ref 3.5–5.0)
Alkaline Phosphatase: 90 U/L (ref 38–126)
Anion gap: 5 (ref 5–15)
BUN: 12 mg/dL (ref 8–23)
CO2: 31 mmol/L (ref 22–32)
Calcium: 9.8 mg/dL (ref 8.9–10.3)
Chloride: 103 mmol/L (ref 98–111)
Creatinine, Ser: 0.55 mg/dL (ref 0.44–1.00)
GFR, Estimated: >60 mL/min (ref >60)
Glucose, Bld: 98 mg/dL (ref 70–99)
Potassium: 3.7 mmol/L (ref 3.5–5.1)
Sodium: 139 mmol/L (ref 135–145)
Total Bilirubin: 0.4 mg/dL (ref 0.3–1.2)
Total Protein: 7.8 g/dL (ref 6.5–8.1)

## 2022-02-06 LAB — CEA (IN HOUSE-CHCC): CEA (CHCC-In House): 1.76 ng/mL (ref 0.00–5.00)

## 2022-02-06 LAB — MAGNESIUM: Magnesium: 1.7 mg/dL (ref 1.7–2.4)

## 2022-02-06 MED ORDER — MAGNESIUM SULFATE 4 GM/100ML IV SOLN
4.0000 g | Freq: Once | INTRAVENOUS | Status: AC
  Administered 2022-02-06: 4 g via INTRAVENOUS
  Filled 2022-02-06: qty 100

## 2022-02-06 MED ORDER — HEPARIN SOD (PORK) LOCK FLUSH 100 UNIT/ML IV SOLN
500.0000 [IU] | Freq: Once | INTRAVENOUS | Status: AC | PRN
  Administered 2022-02-06: 500 [IU]

## 2022-02-06 MED ORDER — SODIUM CHLORIDE 0.9 % IV SOLN
6.0000 mg/kg | Freq: Once | INTRAVENOUS | Status: AC
  Administered 2022-02-06: 500 mg via INTRAVENOUS
  Filled 2022-02-06: qty 5

## 2022-02-06 MED ORDER — SODIUM CHLORIDE 0.9% FLUSH
10.0000 mL | INTRAVENOUS | Status: DC | PRN
  Administered 2022-02-06: 10 mL

## 2022-02-06 MED ORDER — SODIUM CHLORIDE 0.9% FLUSH
10.0000 mL | Freq: Once | INTRAVENOUS | Status: AC
  Administered 2022-02-06: 10 mL

## 2022-02-06 MED ORDER — SODIUM CHLORIDE 0.9 % IV SOLN: Freq: Once | INTRAVENOUS | Status: AC

## 2022-02-26 NOTE — Progress Notes (Unsigned)
[place]   [phone] [phone]   Clinic Follow up Note   Patient Care Team: [name], MD as PCP - General (Internal Medicine) [name], RN (Inactive) as Oncology Nurse Navigator [name], MD as Consulting Physician (Oncology) [name], MD as Consulting Physician (Gastroenterology)  Date of Service:  02/27/2022  CHIEF COMPLAINT: f/u of metastatic colon cancer   CURRENT THERAPY:  -Xeloda started 10/21/20  -current dose: 1500 mg BID days 1-7 q14d -Vectibix, q3weeks, started 02/06/20   ASSESSMENT:  Lindsay [name] is a 66 y.o. female with   metastatic colon 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 Liver biopsy 01/08/20 confirmed metastasis from primary colorectal cancer. PET scan showed hypermetabolism to known liver mets, diffuse thoracic and abdominal lymphadenopathy, a 1.8 cm LUL pulmonary nodule, and splenic flexure of colon. -treated with first line FOLFOX 01/25/20 - 10/02/20, Vectibix added with C2. Oxali discontinued after C18 due to reaction. -switched to Xeloda 10/21/20, dose adjusted due to significant skin toxicity, currently 1500 mg BID 7 days on/7 days off. -restaging CT CAP on 10/13/21 showed overall stable disease. -she is tolerating Xeloda well overall at her current dose with stable skin toxicities.  --restaging CT in Jan   Peripheral neuropathy due to chemotherapy [facility]) -S/p cycle 5, oxaliplatin dose reduced and eventually discontinued after reaction 09/18/20 -Persistent numbness at the feet and hands, mild  Skin rash -She has developed diffuse itching skin rash in her trunk, and all extremities, probably related to Vectibix. -On Vectibix today.  If rash improves, will restart with lower dose next time. -She history of eczema in the past, was seen by dermatology.  I encouraged her to  schedule a follow-up appointment with dermatology   PLAN: -lab reviewed -order CT Scan to be done in 2-3 weeks  -I refill Xeloda -Will hold on Vectibix treatment today, due to severs skin rash. -lab,flush,f/u in 3 wks with Vectibix  SUMMARY OF ONCOLOGIC HISTORY: Oncology History Overview Note  Cancer Staging No matching staging information was found for the patient.    metastatic colon cancer to liver  06/20/2019 Procedure   Colonoscopy by Dr [name] 06/20/19  IMPRESSION -Seven 3 to 10 mm polyps in the sigmoid colon, in the transverse colon and in the escending colon, removed with a cold snare. Resected and retrireved.  -One 47m polyp in the descending colon. Biopsies. Tattoes.  -Mediaum sized lipoma in the ascending colon.   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 See Comment B. Colon, Ascending, polyp, Polypectomy:  -TUBULAR ADENOMA -No high grade dysplasia or malignancy.  C. Colon, TRansverse, Polyo, polectomy:  -TUBULAR ADENOMA -No high grade dysplasia or malignancy.  D. Colon, Sigmoid, Polyp, Polypectomy:  -HYPERPLASTIC POLYP   11/07/2019 Imaging   [place] 11/07/19  IMPRESSION: 1. Two solid masses in the liver are nonspecific. Recommend MRI abdomen with and without contrast for further evaluation.   12/15/2019 Imaging   MRI Abdomen 12/15/19  IMPRESSION: 1. There are two large masses in the liver with appearance favoring metastatic disease or hepatocellular carcinoma or cholangiocarcinoma. A benign etiology is highly unlikely given the enhancement pattern and associated adenopathy. 2. Considerable porta hepatis and retroperitoneal adenopathy. Some of the confluent porta hepatis tumor is potentially infiltrative and abuts the pancreatic body along its right upper margin, making  it difficult to completely exclude the possibility of pancreatic adenocarcinoma primary. Possibilities  helpful in further workup might include tissue diagnosis, endoscopic ultrasound, or nuclear medicine PET-CT. 3. Pancreas divisum. 4. Lumbar spondylosis and degenerative disc disease. 5. Despite efforts by the technologist and patient, motion artifact is present on today's exam and could not be eliminated. This reduces exam sensitivity and specificity.   12/22/2019 Procedure   Upper Endoscopy by Dr Benson [place] 12/22/19  IMPRESSION - One lymph node was visualized and measured in the porta hepatis region. Fine needle aspiration performed    12/22/2019 Initial Biopsy   A. LIVER, PORTA HEPATIS MASS, FINE NEEDLE 12/22/19 ASPIRATION:  FINAL MICROSCOPIC DIAGNOSIS:  - Malignant cells consistent with metastatic adenocarcinoma   01/01/2020 Initial Diagnosis   Intrahepatic cholangiocarcinoma ([place])   01/08/2020 Initial Biopsy   FINAL MICROSCOPIC DIAGNOSIS:   A. LIVER, LEFT LOBE, BIOPSY:  - Metastatic adenocarcinoma, consistent with a colorectal primary.  See  comment      COMMENT:   Immunohistochemical stains show the tumor cells are positive for CK20  and CDX2 but negative for CK7, consistent with above interpretation.  Dr. Burr Medico was notified on 01/10/2020   01/08/2020 Genetic Testing   [name]  [name] wildtype and [name]/[name] mutations which make her eligible for target biological agent Vectibix.   01/24/2020 Procedure   PAC placed 01/24/20   01/25/2020 -  Chemotherapy   first line FOLFOX starting 01/25/2020, Vextibix  added with C2 (02/06/20)   02/06/2020 -  Chemotherapy   Patient is on Treatment Plan : COLORECTAL Panitumumab q14d ([place] - Type Gene Only)     06/20/2020 Imaging   CT CAP  IMPRESSION: 1. Continued interval reduction in size and conspicuity of a subsolid nodule of the peripheral left upper lobe. 2. Unchanged prominent pretracheal and subcarinal lymph nodes. 3. Redemonstrated partially calcified low-attenuation liver masses, slightly decreased in size  compared to prior examination. 4. Slight interval decrease in size of a portacaval lymph node or conglomerate and retroperitoneal lymph nodes. 5. Findings are consistent with continued treatment response of nodal, pulmonary, and hepatic metastatic disease. 6. Coronary artery disease.   Aortic Atherosclerosis (ICD10-I70.0).     10/09/2020 Imaging   IMPRESSION: 1. Slight decrease in size of dominant liver mass with smaller liver mass within 1-2 mm of prior measurement. 2. Stable appearance of celiac lymph and retroperitoneal lymph nodes. Dominant node with calcification in the gastrohepatic ligament as described. 3. Continued decrease in size of LEFT upper lobe nodule. 4. Three-vessel coronary artery calcification. 5. Aortic atherosclerosis.      INTERVAL HISTORY:  Lindsay [name] is here for a follow up of metastatic colon cancer  She was last seen by me on 01/16/2022 She presents to the clinic alone.Pt reports that she has rash on her back and chest, arms and it itch. She use some cream it seems to help a little.   All other systems were reviewed with the patient and are negative.  MEDICAL HISTORY:  Past Medical History:  Diagnosis Date   Arthritis    Diabetes mellitus without complication ([place])    Hypertension    met colon ca to liver 01/2020    SURGICAL HISTORY: Past Surgical History:  Procedure Laterality Date   ABDOMINAL HYSTERECTOMY     ANTERIOR AND POSTERIOR REPAIR WITH SACROSPINOUS FIXATION N/A 07/16/2016   Procedure: ANTERIOR AND POSTERIOR REPAIR WITH SACROSPINOUS FIXATION;  Surgeon: Everlene Farrier, MD;  Location: [facility];  Service: Gynecology;  Laterality: N/A;   CYSTOSCOPY  07/16/2016   Procedure: CYSTOSCOPY;  Surgeon: Everlene Farrier, MD;  Location: [facility];  Service: Gynecology;;   ESOPHAGOGASTRODUODENOSCOPY (EGD) WITH PROPOFOL N/A 12/22/2019   Procedure: ESOPHAGOGASTRODUODENOSCOPY (EGD) WITH PROPOFOL;  Surgeon: [name], MD;  Location: [facility];  Service:  Endoscopy;  Laterality: N/A;   FINE NEEDLE ASPIRATION N/A 12/22/2019   Procedure: FINE NEEDLE ASPIRATION (FNA) LINEAR;  Surgeon: [name], MD;  Location: [facility];  Service: Endoscopy;  Laterality: N/A;   IR IMAGING GUIDED PORT INSERTION  01/24/2020   LAPAROSCOPIC VAGINAL HYSTERECTOMY WITH SALPINGECTOMY Bilateral 07/16/2016   Procedure: LAPAROSCOPIC ASSISTED VAGINAL HYSTERECTOMY WITH SALPINGECTOMY;  Surgeon: Everlene Farrier, MD;  Location: [facility];  Service: Gynecology;  Laterality: Bilateral;   MYOMECTOMY ABDOMINAL APPROACH     THYROID SURGERY     tyroid     UPPER ESOPHAGEAL ENDOSCOPIC ULTRASOUND (EUS) N/A 12/22/2019   Procedure: UPPER ESOPHAGEAL ENDOSCOPIC ULTRASOUND (EUS);  Surgeon: [name], MD;  Location: [facility];  Service: Endoscopy;  Laterality: N/A;    I have reviewed the social history and family history with the patient and they are unchanged from previous note.  ALLERGIES:  is allergic to oxaliplatin.  MEDICATIONS:  Current Outpatient Medications  Medication Sig Dispense Refill   aspirin EC 81 MG tablet Take 1 tablet (81 mg total) by mouth daily. 90 tablet 3   atorvastatin (LIPITOR) 20 MG tablet Take 20 mg by mouth at bedtime.   1   capecitabine (XELODA) 500 MG tablet TAKE 3 TABLETS BY MOUTH 2 TIMES A DAY AFTER A MEAL FOR 7 DAYS ON, FOLLOWED BY 7 DAYS OFF. REPEAT EVERY 14 DAYS. 84 tablet 1   clindamycin (CLINDAGEL) 1 % gel Apply topically 2 (two) times daily. 60 g 2   clobetasol cream (TEMOVATE) 4.08 % Apply 1 application topically daily.     esomeprazole (NEXIUM) 40 MG capsule Take 40 mg by mouth daily.     hydrochlorothiazide (MICROZIDE) 12.5 MG capsule Take 12.5 mg by mouth daily.     KLOR-CON M20 20 MEQ tablet TAKE 1 TABLET TWICE A DAY 180 tablet 3   lidocaine-prilocaine (EMLA) cream Apply 1 application topically as needed. 30 g 1   magic mouthwash w/lidocaine SOLN Take 5 mLs by mouth 4 (four) times daily. 475 mL 2   magnesium oxide (MAG-OX) 400 (240 Mg)  MG tablet Take 2 tablets (800 mg total) by mouth 4 (four) times daily. 240 tablet 3   MAGNESIUM-OXIDE 400 (240 Mg) MG tablet TAKE 2 TABLETS BY MOUTH THREE TIMES DAILY 180 tablet 0   omeprazole (PRILOSEC) 20 MG capsule Take 1 capsule (20 mg total) by mouth daily. 30 capsule 2   ondansetron (ZOFRAN) 8 MG tablet TAKE 1 TABLET BY MOUTH EVERY 8 HOURS AS NEEDED FOR NAUSEA OR VOMITING 20 tablet 0   prochlorperazine (COMPAZINE) 10 MG tablet Take 1 tablet (10 mg total) by mouth every 6 (six) hours as needed for nausea or vomiting. 30 tablet 2   spironolactone (ALDACTONE) 25 MG tablet Take 25 mg by mouth daily with breakfast.     urea (CARMOL) 10 % cream Apply topically 2 (two) times daily. 71 g 2   valACYclovir (VALTREX) 1000 MG tablet Take 1 tablet (1,000 mg total) by mouth 2 (two) times daily. 10 tablet 0   verapamil (CALAN) 120 MG tablet Take 120 mg by mouth 3 (three) times daily.     No current facility-administered medications for this visit.    PHYSICAL EXAMINATION: ECOG PERFORMANCE STATUS: 1 - Symptomatic but completely  ambulatory  Vitals:   02/27/22 1254  BP: 109/70  Pulse: 76  Temp: 98.4 F (36.9 C)  SpO2: 100%   Wt Readings from Last 3 Encounters:  02/27/22 179 lb 6.4 oz (81.4 kg)  02/06/22 180 lb (81.6 kg)  01/16/22 180 lb 11.2 oz (82 kg)      GENERAL:alert, no distress and comfortable SKIN: skin color, texture, turgor are normal, she has diffuse papular skin rashes all over the body with variable sizes, no blisters or ulcers  EYES: normal, Conjunctiva are pink and non-injected, sclera clear  LABORATORY DATA:  I have reviewed the data as listed    Latest Ref Rng & Units 02/27/2022   12:38 PM 02/06/2022    1:47 PM 12/26/2021   10:52 AM  CBC  WBC 4.0 - 10.5 K/uL 6.9  6.0  5.2   Hemoglobin 12.0 - 15.0 g/dL 12.0  12.2  12.7   Hematocrit 36.0 - 46.0 % 35.6  36.2  37.4   Platelets 150 - 400 K/uL 274  233  210         Latest Ref Rng & Units 02/06/2022    1:47 PM  12/26/2021   10:52 AM 12/05/2021   10:11 AM  CMP  Glucose 70 - 99 mg/dL 98  98  87   BUN 8 - 23 mg/dL _0 Creatinine 0.44 - 1.00 mg/dL 0.55  0.56  0.49   Sodium 135 - 145 mmol/L 139  140  138   Potassium 3.5 - 5.1 mmol/L 3.7  3.5  3.9   Chloride 98 - 111 mmol/L 103  103  104   CO2 22 - 32 mmol/L 31  32  30   Calcium 8.9 - 10.3 mg/dL 9.8  9.6  9.5   Total Protein 6.5 - 8.1 g/dL 7.8  7.7  7.9   Total Bilirubin 0.3 - 1.2 mg/dL 0.4  0.4  0.4   Alkaline Phos 38 - 126 U/L 90  90  85   AST 15 - 41 U/L _1 ALT 0 - 44 U/L _2 RADIOGRAPHIC STUDIES: I have personally reviewed the radiological images as listed and agreed with the findings in the report. No results found.    No orders of the defined types were placed in this encounter.  All questions were answered. The patient knows to call the clinic with any problems, questions or concerns. No barriers to learning was detected. The total time spent in the appointment was 30 minutes.     [name], MD 02/27/2022   Felicity Coyer, CMA, am acting as scribe for [name], MD.   I have reviewed the above documentation for accuracy and completeness, and I agree with the above.

## 2022-02-27 ENCOUNTER — Other Ambulatory Visit: Payer: Self-pay | Admitting: Hematology

## 2022-02-27 ENCOUNTER — Inpatient Hospital Stay: Payer: Medicare (Managed Care)

## 2022-02-27 ENCOUNTER — Inpatient Hospital Stay (HOSPITAL_BASED_OUTPATIENT_CLINIC_OR_DEPARTMENT_OTHER): Payer: Medicare (Managed Care) | Admitting: Hematology

## 2022-02-27 ENCOUNTER — Other Ambulatory Visit: Payer: Self-pay

## 2022-02-27 VITALS — BP 109/70 | HR 76 | Temp 98.4°F | Wt 179.4 lb

## 2022-02-27 DIAGNOSIS — G620 Drug-induced polyneuropathy: Secondary | ICD-10-CM

## 2022-02-27 DIAGNOSIS — C221 Intrahepatic bile duct carcinoma: Secondary | ICD-10-CM

## 2022-02-27 DIAGNOSIS — Z7189 Other specified counseling: Secondary | ICD-10-CM

## 2022-02-27 DIAGNOSIS — Z95828 Presence of other vascular implants and grafts: Secondary | ICD-10-CM

## 2022-02-27 DIAGNOSIS — T451X5A Adverse effect of antineoplastic and immunosuppressive drugs, initial encounter: Secondary | ICD-10-CM

## 2022-02-27 DIAGNOSIS — C189 Malignant neoplasm of colon, unspecified: Secondary | ICD-10-CM

## 2022-02-27 DIAGNOSIS — Z5112 Encounter for antineoplastic immunotherapy: Secondary | ICD-10-CM | POA: Diagnosis not present

## 2022-02-27 LAB — CBC WITH DIFFERENTIAL/PLATELET
Abs Immature Granulocytes: 0.02 10*3/uL (ref 0.00–0.07)
Basophils Absolute: 0 10*3/uL (ref 0.0–0.1)
Basophils Relative: 1 %
Eosinophils Absolute: 0.1 10*3/uL (ref 0.0–0.5)
Eosinophils Relative: 1 %
HCT: 35.6 % — ABNORMAL LOW (ref 36.0–46.0)
Hemoglobin: 12 g/dL (ref 12.0–15.0)
Immature Granulocytes: 0 %
Lymphocytes Relative: 20 %
Lymphs Abs: 1.4 10*3/uL (ref 0.7–4.0)
MCH: 32 pg (ref 26.0–34.0)
MCHC: 33.7 g/dL (ref 30.0–36.0)
MCV: 94.9 fL (ref 80.0–100.0)
Monocytes Absolute: 0.7 10*3/uL (ref 0.1–1.0)
Monocytes Relative: 9 %
Neutro Abs: 4.8 10*3/uL (ref 1.7–7.7)
Neutrophils Relative %: 69 %
Platelets: 274 10*3/uL (ref 150–400)
RBC: 3.75 MIL/uL — ABNORMAL LOW (ref 3.87–5.11)
RDW: 15.2 % (ref 11.5–15.5)
WBC: 6.9 10*3/uL (ref 4.0–10.5)
nRBC: 0 % (ref 0.0–0.2)

## 2022-02-27 LAB — COMPREHENSIVE METABOLIC PANEL
ALT: 21 U/L (ref 0–44)
AST: 22 U/L (ref 15–41)
Albumin: 4.1 g/dL (ref 3.5–5.0)
Alkaline Phosphatase: 88 U/L (ref 38–126)
Anion gap: 5 (ref 5–15)
BUN: 9 mg/dL (ref 8–23)
CO2: 29 mmol/L (ref 22–32)
Calcium: 9.9 mg/dL (ref 8.9–10.3)
Chloride: 104 mmol/L (ref 98–111)
Creatinine, Ser: 0.57 mg/dL (ref 0.44–1.00)
GFR, Estimated: >60 mL/min (ref >60)
Glucose, Bld: 94 mg/dL (ref 70–99)
Potassium: 4 mmol/L (ref 3.5–5.1)
Sodium: 138 mmol/L (ref 135–145)
Total Bilirubin: 0.4 mg/dL (ref 0.3–1.2)
Total Protein: 7.9 g/dL (ref 6.5–8.1)

## 2022-02-27 LAB — MAGNESIUM: Magnesium: 1.8 mg/dL (ref 1.7–2.4)

## 2022-02-27 LAB — CEA (IN HOUSE-CHCC): CEA (CHCC-In House): 2.54 ng/mL (ref 0.00–5.00)

## 2022-02-27 MED ORDER — SODIUM CHLORIDE 0.9% FLUSH
10.0000 mL | Freq: Once | INTRAVENOUS | Status: AC
  Administered 2022-02-27: 10 mL

## 2022-02-27 NOTE — Assessment & Plan Note (Signed)
MSS, [name]/[name] wildtype -diagnosed 12/2019 by porta hepatis LN biopsy during EUS for work up of abdominal pain and liver lesions seen on [place] and MRI. Liver biopsy 01/08/20 confirmed metastasis from primary colorectal cancer. PET scan showed hypermetabolism to known liver mets, diffuse thoracic and abdominal lymphadenopathy, a 1.8 cm LUL pulmonary nodule, and splenic flexure of colon. -treated with first line FOLFOX 01/25/20 - 10/02/20, Vectibix added with C2. Oxali discontinued after C18 due to reaction. -switched to Xeloda 10/21/20, dose adjusted due to significant skin toxicity, currently 1500 mg BID 7 days on/7 days off. -restaging CT CAP on 10/13/21 showed overall stable disease. -she is tolerating Xeloda well overall at her current dose with stable skin toxicities.  --restaging CT in Jan

## 2022-02-27 NOTE — Assessment & Plan Note (Signed)
-  S/p cycle 5, oxaliplatin dose reduced and eventually discontinued after reaction 09/18/20 -Persistent numbness at the feet and hands, mild

## 2022-02-28 ENCOUNTER — Encounter: Payer: Self-pay | Admitting: Hematology

## 2022-03-01 ENCOUNTER — Other Ambulatory Visit: Payer: Self-pay

## 2022-03-04 ENCOUNTER — Other Ambulatory Visit: Payer: Self-pay

## 2022-03-05 ENCOUNTER — Other Ambulatory Visit: Payer: Self-pay

## 2022-03-07 ENCOUNTER — Other Ambulatory Visit: Payer: Self-pay

## 2022-03-16 ENCOUNTER — Other Ambulatory Visit (HOSPITAL_COMMUNITY): Payer: Self-pay

## 2022-03-17 ENCOUNTER — Other Ambulatory Visit: Payer: Medicare (Managed Care)

## 2022-03-17 ENCOUNTER — Inpatient Hospital Stay: Payer: Medicare (Managed Care) | Attending: Hematology

## 2022-03-17 DIAGNOSIS — Z5112 Encounter for antineoplastic immunotherapy: Secondary | ICD-10-CM | POA: Insufficient documentation

## 2022-03-17 DIAGNOSIS — G620 Drug-induced polyneuropathy: Secondary | ICD-10-CM | POA: Diagnosis not present

## 2022-03-17 DIAGNOSIS — I10 Essential (primary) hypertension: Secondary | ICD-10-CM | POA: Insufficient documentation

## 2022-03-17 DIAGNOSIS — R970 Elevated carcinoembryonic antigen [CEA]: Secondary | ICD-10-CM | POA: Diagnosis not present

## 2022-03-17 DIAGNOSIS — C787 Secondary malignant neoplasm of liver and intrahepatic bile duct: Secondary | ICD-10-CM | POA: Insufficient documentation

## 2022-03-17 DIAGNOSIS — Z79624 Long term (current) use of inhibitors of nucleotide synthesis: Secondary | ICD-10-CM | POA: Diagnosis not present

## 2022-03-17 DIAGNOSIS — C19 Malignant neoplasm of rectosigmoid junction: Secondary | ICD-10-CM | POA: Insufficient documentation

## 2022-03-17 DIAGNOSIS — Z79899 Other long term (current) drug therapy: Secondary | ICD-10-CM | POA: Insufficient documentation

## 2022-03-17 DIAGNOSIS — R21 Rash and other nonspecific skin eruption: Secondary | ICD-10-CM | POA: Diagnosis not present

## 2022-03-17 DIAGNOSIS — E119 Type 2 diabetes mellitus without complications: Secondary | ICD-10-CM | POA: Insufficient documentation

## 2022-03-17 DIAGNOSIS — Z7982 Long term (current) use of aspirin: Secondary | ICD-10-CM | POA: Insufficient documentation

## 2022-03-17 DIAGNOSIS — C7800 Secondary malignant neoplasm of unspecified lung: Secondary | ICD-10-CM | POA: Insufficient documentation

## 2022-03-17 DIAGNOSIS — Z5111 Encounter for antineoplastic chemotherapy: Secondary | ICD-10-CM | POA: Diagnosis not present

## 2022-03-17 DIAGNOSIS — Z95828 Presence of other vascular implants and grafts: Secondary | ICD-10-CM

## 2022-03-17 DIAGNOSIS — T451X5D Adverse effect of antineoplastic and immunosuppressive drugs, subsequent encounter: Secondary | ICD-10-CM | POA: Diagnosis not present

## 2022-03-17 DIAGNOSIS — C189 Malignant neoplasm of colon, unspecified: Secondary | ICD-10-CM

## 2022-03-17 DIAGNOSIS — C221 Intrahepatic bile duct carcinoma: Secondary | ICD-10-CM

## 2022-03-17 LAB — COMPREHENSIVE METABOLIC PANEL
ALT: 27 U/L (ref 0–44)
AST: 23 U/L (ref 15–41)
Albumin: 4.3 g/dL (ref 3.5–5.0)
Alkaline Phosphatase: 106 U/L (ref 38–126)
Anion gap: 6 (ref 5–15)
BUN: 14 mg/dL (ref 8–23)
CO2: 27 mmol/L (ref 22–32)
Calcium: 9.8 mg/dL (ref 8.9–10.3)
Chloride: 105 mmol/L (ref 98–111)
Creatinine, Ser: 0.63 mg/dL (ref 0.44–1.00)
GFR, Estimated: >60 mL/min (ref >60)
Glucose, Bld: 100 mg/dL — ABNORMAL HIGH (ref 70–99)
Potassium: 4.1 mmol/L (ref 3.5–5.1)
Sodium: 138 mmol/L (ref 135–145)
Total Bilirubin: 0.3 mg/dL (ref 0.3–1.2)
Total Protein: 8 g/dL (ref 6.5–8.1)

## 2022-03-17 LAB — CBC WITH DIFFERENTIAL/PLATELET
Abs Immature Granulocytes: 0.02 10*3/uL (ref 0.00–0.07)
Basophils Absolute: 0 10*3/uL (ref 0.0–0.1)
Basophils Relative: 1 %
Eosinophils Absolute: 0.1 10*3/uL (ref 0.0–0.5)
Eosinophils Relative: 2 %
HCT: 34.9 % — ABNORMAL LOW (ref 36.0–46.0)
Hemoglobin: 12 g/dL (ref 12.0–15.0)
Immature Granulocytes: 0 %
Lymphocytes Relative: 21 %
Lymphs Abs: 1 10*3/uL (ref 0.7–4.0)
MCH: 32.5 pg (ref 26.0–34.0)
MCHC: 34.4 g/dL (ref 30.0–36.0)
MCV: 94.6 fL (ref 80.0–100.0)
Monocytes Absolute: 0.5 10*3/uL (ref 0.1–1.0)
Monocytes Relative: 9 %
Neutro Abs: 3.3 10*3/uL (ref 1.7–7.7)
Neutrophils Relative %: 67 %
Platelets: 236 10*3/uL (ref 150–400)
RBC: 3.69 MIL/uL — ABNORMAL LOW (ref 3.87–5.11)
RDW: 15.7 % — ABNORMAL HIGH (ref 11.5–15.5)
WBC: 5 10*3/uL (ref 4.0–10.5)
nRBC: 0 % (ref 0.0–0.2)

## 2022-03-17 LAB — CEA (IN HOUSE-CHCC): CEA (CHCC-In House): 6.51 ng/mL — ABNORMAL HIGH (ref 0.00–5.00)

## 2022-03-17 LAB — MAGNESIUM: Magnesium: 1.7 mg/dL (ref 1.7–2.4)

## 2022-03-17 MED ORDER — HEPARIN SOD (PORK) LOCK FLUSH 100 UNIT/ML IV SOLN
500.0000 [IU] | Freq: Once | INTRAVENOUS | Status: AC
  Administered 2022-03-17: 500 [IU]

## 2022-03-17 MED ORDER — SODIUM CHLORIDE 0.9% FLUSH
10.0000 mL | Freq: Once | INTRAVENOUS | Status: AC
  Administered 2022-03-17: 10 mL

## 2022-03-19 ENCOUNTER — Ambulatory Visit (HOSPITAL_COMMUNITY)
Admission: RE | Admit: 2022-03-19 | Discharge: 2022-03-19 | Disposition: A | Discharge status: Home / Self Care | Payer: Medicare (Managed Care) | Source: Ambulatory Visit | Attending: Hematology | Admitting: Hematology

## 2022-03-19 DIAGNOSIS — C221 Intrahepatic bile duct carcinoma: Secondary | ICD-10-CM | POA: Diagnosis present

## 2022-03-19 MED ORDER — SODIUM CHLORIDE (PF) 0.9 % IJ SOLN
INTRAMUSCULAR | Status: AC
  Filled 2022-03-19: qty 50

## 2022-03-19 MED ORDER — IOHEXOL 9 MG/ML PO SOLN: 1000.0000 mL | ORAL | Status: AC

## 2022-03-19 MED ORDER — IOHEXOL 300 MG/ML  SOLN
100.0000 mL | Freq: Once | INTRAMUSCULAR | Status: AC | PRN
  Administered 2022-03-19: 100 mL via INTRAVENOUS

## 2022-03-19 NOTE — Progress Notes (Signed)
[place]   Telephone:(336) [phone] Fax:(336) [phone]   Clinic Follow up Note   Patient Care Team: [name], MD as PCP - General (Internal Medicine) [name], RN (Inactive) as Oncology Nurse Navigator [name], MD as Consulting Physician (Oncology) [name], MD as Consulting Physician (Gastroenterology)  Date of Service:  03/20/2022  CHIEF COMPLAINT: f/u of metastatic colon cancer     CURRENT THERAPY:  -Xeloda started 10/21/20  -current dose: 1500 mg BID days 1-7 q14d -Vectibix, q3weeks, started 02/06/20   ASSESSMENT:  Lindsay [name] is a 67 y.o. female with   metastatic colon 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 Liver biopsy 01/08/20 confirmed metastasis from primary colorectal cancer. PET scan showed hypermetabolism to known liver mets, diffuse thoracic and abdominal lymphadenopathy, a 1.8 cm LUL pulmonary nodule, and splenic flexure of colon. -treated with first line FOLFOX 01/25/20 - 10/02/20, Vectibix added with C2. Oxali discontinued after C18 due to reaction. -switched to Xeloda 10/21/20, dose adjusted due to significant skin toxicity, currently 1500 mg BID 7 days on/7 days off. -restaging CT CAP on 10/13/21 showed overall stable disease. -she is tolerating Xeloda well overall at her current dose with stable skin toxicities.  --restaging CT yesterday showed a new lesion in the liver, previous out liver metastasis are stable.  I personally reviewed her CT scan images with patient and discussed the findings. -Given her cancer progression, I recommended change her treatment.  I discussed the option of changing back to FOLFOX, versus FOLFIRI, giving her residual neuropathy, I recommend FOLFIRI and bevacizumab.  Will stop Vectibix and Xeloda.  Potential side effect, especially risk of infection, diarrhea, fatigue,  hypertension, proteinuria, small risk of hemorrhage and thrombosis, were discussed with with her in detail, she agrees to proceed.  We will start next week.  Peripheral neuropathy due to chemotherapy [facility]) -started after cycle 5, oxaliplatin dose reduced and eventually discontinued after reaction 09/18/20 -Persistent numbness at the feet and hands, mild overall     PLAN: - lab pending -discuss CT scan -cancer progression in liver  -Tumor marker -elevated - Discuss treatment options and its side effects, -will discontinue Vectibix ans xeloda  - start new Treatment next week.FOLFIRI+ BEVA -Cancel Treatment today -phone visit one week after first cycle    SUMMARY OF ONCOLOGIC HISTORY: Oncology History Overview Note  Cancer Staging No matching staging information was found for the patient.    metastatic colon cancer to liver  06/20/2019 Procedure   Colonoscopy by Dr [name] 06/20/19  IMPRESSION -Seven 3 to 10 mm polyps in the sigmoid colon, in the transverse colon and in the escending colon, removed with a cold snare. Resected and retrireved.  -One 3m polyp in the descending colon. Biopsies. Tattoes.  -Mediaum sized lipoma in the ascending colon.   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 See Comment B. Colon, Ascending, polyp, Polypectomy:  -TUBULAR ADENOMA -No high grade dysplasia or malignancy.  C. Colon, TRansverse, Polyo, polectomy:  -TUBULAR ADENOMA -No high grade dysplasia or malignancy.  D. Colon, Sigmoid, Polyp, Polypectomy:  -HYPERPLASTIC POLYP   11/07/2019 Imaging   [place]dom[place] 11/07/19  IMPRESSION: 1. Two solid masses in the liver are nonspecific. Recommend MRI abdomen with and without contrast for further evaluation.   12/15/2019 Imaging   MRI Abdomen 12/15/19  IMPRESSION: 1. There are two large masses in the liver with appearance favoring metastatic disease or hepatocellular  carcinoma or cholangiocarcinoma. A benign etiology is highly unlikely given the enhancement pattern and associated adenopathy. 2. Considerable porta hepatis and retroperitoneal adenopathy. Some of the confluent porta hepatis tumor is potentially infiltrative and abuts the pancreatic body along its right upper margin, making it difficult to completely exclude the possibility of pancreatic adenocarcinoma primary. Possibilities helpful in further workup might include tissue diagnosis, endoscopic ultrasound, or nuclear medicine PET-CT. 3. Pancreas divisum. 4. Lumbar spondylosis and degenerative disc disease. 5. Despite efforts by the technologist and patient, motion artifact is present on today's exam and could not be eliminated. This reduces exam sensitivity and specificity.   12/22/2019 Procedure   Upper Endoscopy by Dr Benson [place] 12/22/19  IMPRESSION - One lymph node was visualized and measured in the porta hepatis region. Fine needle aspiration performed    12/22/2019 Initial Biopsy   A. LIVER, PORTA HEPATIS MASS, FINE NEEDLE 12/22/19 ASPIRATION:  FINAL MICROSCOPIC DIAGNOSIS:  - Malignant cells consistent with metastatic adenocarcinoma   01/01/2020 Initial Diagnosis   Intrahepatic cholangiocarcinoma ([place])   01/08/2020 Initial Biopsy   FINAL MICROSCOPIC DIAGNOSIS:   A. LIVER, LEFT LOBE, BIOPSY:  - Metastatic adenocarcinoma, consistent with a colorectal primary.  See  comment      COMMENT:   Immunohistochemical stains show the tumor cells are positive for CK20  and CDX2 but negative for CK7, consistent with above interpretation.  Dr. Burr Medico was notified on 01/10/2020   01/08/2020 Genetic Testing   [name]  [name] wildtype and [name]/[name] mutations which make her eligible for target biological agent Vectibix.   01/24/2020 Procedure   PAC placed 01/24/20   01/25/2020 -  Chemotherapy   first line FOLFOX starting 01/25/2020, Vextibix  added with C2 (02/06/20)   02/06/2020 -  02/06/2022 Chemotherapy   Patient is on Treatment Plan : COLORECTAL Panitumumab q14d ([place] - Type Gene Only)     06/20/2020 Imaging   CT CAP  IMPRESSION: 1. Continued interval reduction in size and conspicuity of a subsolid nodule of the peripheral left upper lobe. 2. Unchanged prominent pretracheal and subcarinal lymph nodes. 3. Redemonstrated partially calcified low-attenuation liver masses, slightly decreased in size compared to prior examination. 4. Slight interval decrease in size of a portacaval lymph node or conglomerate and retroperitoneal lymph nodes. 5. Findings are consistent with continued treatment response of nodal, pulmonary, and hepatic metastatic disease. 6. Coronary artery disease.   Aortic Atherosclerosis (ICD10-I70.0).     10/09/2020 Imaging   IMPRESSION: 1. Slight decrease in size of dominant liver mass with smaller liver mass within 1-2 mm of prior measurement. 2. Stable appearance of celiac lymph and retroperitoneal lymph nodes. Dominant node with calcification in the gastrohepatic ligament as described. 3. Continued decrease in size of LEFT upper lobe nodule. 4. Three-vessel coronary artery calcification. 5. Aortic atherosclerosis.   03/25/2022 -  Chemotherapy   Patient is on Treatment Plan : COLORECTAL FOLFIRI + Bevacizumab q14d        INTERVAL HISTORY:  Lindsay [name] is here for a follow up of metastatic colon cancer     She was last seen by me on 02/27/2022 She presents to the clinic alone. Pt reports she still have some rash on her hands. She seen her PCP about the rash, Vaseline seems to help. Pt ask questions about the new chemo treatment.     All other systems were reviewed with the patient and are negative.  MEDICAL HISTORY:  Past Medical History:  Diagnosis Date   Arthritis    Diabetes mellitus without complication ([place])    Hypertension    met colon ca to liver 01/2020    SURGICAL HISTORY: Past Surgical History:  Procedure  Laterality Date   ABDOMINAL HYSTERECTOMY     ANTERIOR AND POSTERIOR REPAIR WITH SACROSPINOUS FIXATION N/A 07/16/2016   Procedure: ANTERIOR AND POSTERIOR REPAIR WITH SACROSPINOUS FIXATION;  Surgeon: Everlene Farrier, MD;  Location: [facility];  Service: Gynecology;  Laterality: N/A;   CYSTOSCOPY  07/16/2016   Procedure: CYSTOSCOPY;  Surgeon: Everlene Farrier, MD;  Location: [facility];  Service: Gynecology;;   ESOPHAGOGASTRODUODENOSCOPY (EGD) WITH PROPOFOL N/A 12/22/2019   Procedure: ESOPHAGOGASTRODUODENOSCOPY (EGD) WITH PROPOFOL;  Surgeon: [name], MD;  Location: [facility];  Service: Endoscopy;  Laterality: N/A;   FINE NEEDLE ASPIRATION N/A 12/22/2019   Procedure: FINE NEEDLE ASPIRATION (FNA) LINEAR;  Surgeon: [name], MD;  Location: [facility];  Service: Endoscopy;  Laterality: N/A;   IR IMAGING GUIDED PORT INSERTION  01/24/2020   LAPAROSCOPIC VAGINAL HYSTERECTOMY WITH SALPINGECTOMY Bilateral 07/16/2016   Procedure: LAPAROSCOPIC ASSISTED VAGINAL HYSTERECTOMY WITH SALPINGECTOMY;  Surgeon: Everlene Farrier, MD;  Location: [facility];  Service: Gynecology;  Laterality: Bilateral;   MYOMECTOMY ABDOMINAL APPROACH     THYROID SURGERY     tyroid     UPPER ESOPHAGEAL ENDOSCOPIC ULTRASOUND (EUS) N/A 12/22/2019   Procedure: UPPER ESOPHAGEAL ENDOSCOPIC ULTRASOUND (EUS);  Surgeon: [name], MD;  Location: [facility];  Service: Endoscopy;  Laterality: N/A;    I have reviewed the social history and family history with the patient and they are unchanged from previous note.  ALLERGIES:  is allergic to oxaliplatin.  MEDICATIONS:  Current Outpatient Medications  Medication Sig Dispense Refill   aspirin EC 81 MG tablet Take 1 tablet (81 mg total) by mouth daily. 90 tablet 3   atorvastatin (LIPITOR) 20 MG tablet Take 20 mg by mouth at bedtime.   1   capecitabine (XELODA) 500 MG tablet TAKE 3 TABLETS BY MOUTH 2 TIMES A DAY AFTER A MEAL FOR 7 DAYS ON, FOLLOWED BY 7 DAYS OFF. REPEAT EVERY 14 DAYS. 84  tablet 1   clindamycin (CLINDAGEL) 1 % gel Apply topically 2 (two) times daily. 60 g 2   clobetasol cream (TEMOVATE) 4.58 % Apply 1 application topically daily.     esomeprazole (NEXIUM) 40 MG capsule Take 40 mg by mouth daily.     hydrochlorothiazide (MICROZIDE) 12.5 MG capsule Take 12.5 mg by mouth daily.     KLOR-CON M20 20 MEQ tablet TAKE 1 TABLET TWICE A DAY 180 tablet 3   lidocaine-prilocaine (EMLA) cream Apply 1 application topically as needed. 30 g 1   magic mouthwash w/lidocaine SOLN Take 5 mLs by mouth 4 (four) times daily. 475 mL 2   magnesium oxide (MAG-OX) 400 (240 Mg) MG tablet Take 2 tablets (800 mg total) by mouth 4 (four) times daily. 240 tablet 3   MAGNESIUM-OXIDE 400 (240 Mg) MG tablet TAKE 2 TABLETS BY MOUTH THREE TIMES DAILY 180 tablet 0   omeprazole (PRILOSEC) 20 MG capsule Take 1 capsule (20 mg total) by mouth daily. 30 capsule 2   ondansetron (ZOFRAN) 8 MG tablet TAKE 1 TABLET BY MOUTH EVERY 8 HOURS AS NEEDED FOR NAUSEA OR VOMITING 20 tablet 0   prochlorperazine (COMPAZINE) 10 MG tablet Take 1 tablet (10 mg total) by mouth every 6 (six) hours as needed for nausea or vomiting. 30 tablet 2   spironolactone (ALDACTONE)  25 MG tablet Take 25 mg by mouth daily with breakfast.     urea (CARMOL) 10 % cream Apply topically 2 (two) times daily. 71 g 2   valACYclovir (VALTREX) 1000 MG tablet Take 1 tablet (1,000 mg total) by mouth 2 (two) times daily. 10 tablet 0   verapamil (CALAN) 120 MG tablet Take 120 mg by mouth 3 (three) times daily.     No current facility-administered medications for this visit.    PHYSICAL EXAMINATION: ECOG PERFORMANCE STATUS: 1 - Symptomatic but completely ambulatory  Vitals:   03/20/22 0810  BP: 122/83  Pulse: 60  Resp: 15  Temp: 98.3 F (36.8 C)  SpO2: 98%   Wt Readings from Last 3 Encounters:  03/20/22 180 lb 3.2 oz (81.7 kg)  02/27/22 179 lb 6.4 oz (81.4 kg)  02/06/22 180 lb (81.6 kg)     GENERAL:alert, no distress and  comfortable SKIN: skin color normal, no rashes or significant lesions EYES: normal, Conjunctiva are pink and non-injected, sclera clear  NEURO: alert & oriented x 3 with fluent speech  LABORATORY DATA:  I have reviewed the data as listed    Latest Ref Rng & Units 03/20/2022    7:48 AM 03/17/2022   11:09 AM 02/27/2022   12:38 PM  CBC  WBC 4.0 - 10.5 K/uL 5.3  5.0  6.9   Hemoglobin 12.0 - 15.0 g/dL 11.5  12.0  12.0   Hematocrit 36.0 - 46.0 % 34.2  34.9  35.6   Platelets 150 - 400 K/uL 227  236  274         Latest Ref Rng & Units 03/20/2022    7:48 AM 03/17/2022   11:09 AM 02/27/2022   12:38 PM  CMP  Glucose 70 - 99 mg/dL 113  100  94   BUN 8 - 23 mg/dL '15  14  9   '$ Creatinine 0.44 - 1.00 mg/dL 0.57  0.63  0.57   Sodium 135 - 145 mmol/L 136  138  138   Potassium 3.5 - 5.1 mmol/L 4.1  4.1  4.0   Chloride 98 - 111 mmol/L 104  105  104   CO2 22 - 32 mmol/L '27  27  29   '$ Calcium 8.9 - 10.3 mg/dL 9.5  9.8  9.9   Total Protein 6.5 - 8.1 g/dL 7.6  8.0  7.9   Total Bilirubin 0.3 - 1.2 mg/dL 0.3  0.3  0.4   Alkaline Phos 38 - 126 U/L 109  106  88   AST 15 - 41 U/L '25  23  22   '$ ALT 0 - 44 U/L '27  27  21       '$ RADIOGRAPHIC STUDIES: I have personally reviewed the radiological images as listed and agreed with the findings in the report. CT CHEST ABDOMEN PELVIS W CONTRAST  Result Date: 03/20/2022 CLINICAL DATA:  67 year old female with history of colon cancer. Evaluate treatment response. * Tracking Code: BO * EXAM: CT CHEST, ABDOMEN, AND PELVIS WITH CONTRAST TECHNIQUE: Multidetector CT imaging of the chest, abdomen and pelvis was performed following the standard protocol during bolus administration of intravenous contrast. RADIATION DOSE REDUCTION: This exam was performed according to the departmental dose-optimization program which includes automated exposure control, adjustment of the mA and/or kV according to patient size and/or use of iterative reconstruction technique. CONTRAST:  131m  OMNIPAQUE IOHEXOL 300 MG/ML  SOLN COMPARISON:  Numerous priors, most recently CT of the chest, abdomen and pelvis 10/13/2021. FINDINGS: CT  CHEST FINDINGS Cardiovascular: Heart size is mildly enlarged. There is no significant pericardial fluid, thickening or pericardial calcification. There is aortic atherosclerosis, as well as atherosclerosis of the great vessels of the mediastinum and the coronary arteries, including calcified atherosclerotic plaque in the left main, left anterior descending, left circumflex and right coronary arteries. Thickening and calcification of the aortic valve. Right internal jugular single-lumen Port-A-Cath with tip terminating in the right atrium. Mediastinum/Nodes: No pathologically enlarged mediastinal or hilar lymph nodes. Esophagus is unremarkable in appearance. No axillary lymphadenopathy. Lungs/Pleura: New nodular density in the periphery of the left upper lobe (axial image 42 of series 6) measuring 7 x 5 mm (mean diameter of 6 mm) in an area where there was previously some peripheral thick-walled cavitation, likely to reflect contraction of this area with scarring and fluid in the previously noted cystic spaces, although a metastatic lesion would be difficult to entirely exclude (1 is not strongly favored on the basis of today's examination). No other definite suspicious appearing pulmonary nodules or masses are noted. No acute consolidative airspace disease or pleural effusions. Mild fibrosis in the medial aspect of the right lower lobe adjacent to several prominent marginal osteophytes, similar to numerous prior examinations. Musculoskeletal: There are no aggressive appearing lytic or blastic lesions noted in the visualized portions of the skeleton. CT ABDOMEN PELVIS FINDINGS Hepatobiliary: New poorly defined hypovascular lesion in segment 8 (axial image 46 of series 2) measuring 3.7 x 3.4 cm, likely a new metastatic lesion. Adjacent to this (axial image 44 of series 2) there is  a 4.4 x 3.8 cm hypovascular densely calcified lesion which is grossly unchanged compared to the prior study. Other previously noted lesion in the left lobe of the liver between segments 2 and 4A (axial image 44 of series 2) is also hypovascular and centrally densely calcified estimated to measure 3.3 x 2.9 cm, also grossly stable compared to the prior study. No intra or extrahepatic biliary ductal dilatation. Gallbladder is unremarkable in appearance. Pancreas: No pancreatic mass. No pancreatic ductal dilatation. No pancreatic or peripancreatic fluid collections or inflammatory changes. Spleen: Unremarkable. Adrenals/Urinary Tract: Bilateral kidneys and bilateral adrenal glands are normal in appearance. No hydroureteronephrosis. Urinary bladder is moderately distended, but otherwise unremarkable in appearance. Stomach/Bowel: The appearance of the stomach is unremarkable. There is no pathologic dilatation of small bowel or colon. Normal appendix. Vascular/Lymphatic: Aortic atherosclerosis, without evidence of aneurysm or dissection in the abdominal or pelvic vasculature. Gastrohepatic ligament lymph node currently measures 1 cm (previously 6 mm). No other lymphadenopathy noted in the abdomen or pelvis. Reproductive: Status post hysterectomy. Ovaries are not confidently identified may be surgically absent or atrophic. Other: No significant volume of ascites.  No pneumoperitoneum. Musculoskeletal: Status post right hip arthroplasty. There are no aggressive appearing lytic or blastic lesions noted in the visualized portions of the skeleton. IMPRESSION: 1. Interval development of a new mass centered in segment 8 of the liver, concerning for a new hepatic metastasis. Other previously noted treated metastatic lesions in the liver are stable. 2. Mild enlargement of gastrohepatic ligament lymph node. No other new lymphadenopathy noted elsewhere in the abdomen or pelvis. 3. Previously described area of cystic change in the  periphery of the left upper lobe is smaller and more solid in appearance, favored to reflect evolving post infectious scarring, however, close attention on follow-up studies is recommended to ensure continued contraction, as a metastatic lesion (not favored) is not entirely excluded. 4. Aortic atherosclerosis, in addition to left main and three-vessel coronary  artery disease. Please note that although the presence of coronary artery calcium documents the presence of coronary artery disease, the severity of this disease and any potential stenosis cannot be assessed on this non-gated CT examination. Assessment for potential risk factor modification, dietary therapy or pharmacologic therapy may be warranted, if clinically indicated. 5. There are calcifications of the aortic valve. Echocardiographic correlation for evaluation of potential valvular dysfunction may be warranted if clinically indicated. 6. Additional incidental findings, as above. Electronically Signed   By: Vinnie Langton M.D.   On: 03/20/2022 07:50      Orders Placed This Encounter  Procedures   CBC with Differential ([facility] Only)    Standing Status:   Future    Standing Expiration Date:   03/26/2023   CMP ([place] only)    Standing Status:   Future    Standing Expiration Date:   03/26/2023   Total Protein, Urine dipstick    Standing Status:   Future    Standing Expiration Date:   03/26/2023   CBC with Differential ([place] Only)    Standing Status:   Future    Standing Expiration Date:   04/09/2023   CMP ([place] only)    Standing Status:   Future    Standing Expiration Date:   04/09/2023   All questions were answered. The patient knows to call the clinic with any problems, questions or concerns. No barriers to learning was detected. The total time spent in the appointment was 40 minutes.     [name], MD 03/20/2022   Felicity Coyer, CMA, am acting as scribe for [name], MD.   I have reviewed the  above documentation for accuracy and completeness, and I agree with the above.

## 2022-03-19 NOTE — Assessment & Plan Note (Addendum)
MSS, [name]/[name] wildtype -diagnosed 12/2019 by porta hepatis LN biopsy during EUS for work up of abdominal pain and liver lesions seen on [place] and MRI. Liver biopsy 01/08/20 confirmed metastasis from primary colorectal cancer. PET scan showed hypermetabolism to known liver mets, diffuse thoracic and abdominal lymphadenopathy, a 1.8 cm LUL pulmonary nodule, and splenic flexure of colon. -treated with first line FOLFOX 01/25/20 - 10/02/20, Vectibix added with C2. Oxali discontinued after C18 due to reaction. -switched to Xeloda 10/21/20, dose adjusted due to significant skin toxicity, currently 1500 mg BID 7 days on/7 days off. -restaging CT CAP on 10/13/21 showed overall stable disease. -she is tolerating Xeloda well overall at her current dose with stable skin toxicities.  --restaging CT yesterday showed a new lesion in the liver, previous out liver metastasis are stable.  I personally reviewed her CT scan images with patient and discussed the findings. -Given her cancer progression, I recommended change her treatment.  I discussed the option of changing back to FOLFOX, versus FOLFIRI, giving her residual neuropathy, I recommend FOLFIRI and bevacizumab.  Will stop Vectibix and Xeloda.  Potential side effect, especially risk of infection, diarrhea, fatigue, hypertension, proteinuria, small risk of hemorrhage and thrombosis, were discussed with with her in detail, she agrees to proceed.  We will start next week.

## 2022-03-19 NOTE — Assessment & Plan Note (Signed)
-  started after cycle 5, oxaliplatin dose reduced and eventually discontinued after reaction 09/18/20 -Persistent numbness at the feet and hands, mild overall

## 2022-03-20 ENCOUNTER — Encounter: Payer: Self-pay | Admitting: Hematology

## 2022-03-20 ENCOUNTER — Inpatient Hospital Stay (HOSPITAL_BASED_OUTPATIENT_CLINIC_OR_DEPARTMENT_OTHER): Payer: Medicare (Managed Care) | Admitting: Hematology

## 2022-03-20 ENCOUNTER — Telehealth: Payer: Self-pay | Admitting: Hematology

## 2022-03-20 ENCOUNTER — Inpatient Hospital Stay: Payer: Medicare (Managed Care)

## 2022-03-20 ENCOUNTER — Other Ambulatory Visit: Payer: Self-pay

## 2022-03-20 VITALS — BP 122/83 | HR 60 | Temp 98.3°F | Resp 15 | Wt 180.2 lb

## 2022-03-20 DIAGNOSIS — C787 Secondary malignant neoplasm of liver and intrahepatic bile duct: Secondary | ICD-10-CM | POA: Diagnosis not present

## 2022-03-20 DIAGNOSIS — Z7189 Other specified counseling: Secondary | ICD-10-CM

## 2022-03-20 DIAGNOSIS — C19 Malignant neoplasm of rectosigmoid junction: Secondary | ICD-10-CM

## 2022-03-20 DIAGNOSIS — Z95828 Presence of other vascular implants and grafts: Secondary | ICD-10-CM

## 2022-03-20 DIAGNOSIS — T451X5A Adverse effect of antineoplastic and immunosuppressive drugs, initial encounter: Secondary | ICD-10-CM

## 2022-03-20 DIAGNOSIS — C221 Intrahepatic bile duct carcinoma: Secondary | ICD-10-CM

## 2022-03-20 DIAGNOSIS — G620 Drug-induced polyneuropathy: Secondary | ICD-10-CM

## 2022-03-20 DIAGNOSIS — C189 Malignant neoplasm of colon, unspecified: Secondary | ICD-10-CM

## 2022-03-20 DIAGNOSIS — C7800 Secondary malignant neoplasm of unspecified lung: Secondary | ICD-10-CM

## 2022-03-20 DIAGNOSIS — T451X5D Adverse effect of antineoplastic and immunosuppressive drugs, subsequent encounter: Secondary | ICD-10-CM

## 2022-03-20 DIAGNOSIS — R970 Elevated carcinoembryonic antigen [CEA]: Secondary | ICD-10-CM

## 2022-03-20 DIAGNOSIS — Z5112 Encounter for antineoplastic immunotherapy: Secondary | ICD-10-CM | POA: Diagnosis not present

## 2022-03-20 LAB — COMPREHENSIVE METABOLIC PANEL
ALT: 27 U/L (ref 0–44)
AST: 25 U/L (ref 15–41)
Albumin: 3.9 g/dL (ref 3.5–5.0)
Alkaline Phosphatase: 109 U/L (ref 38–126)
Anion gap: 5 (ref 5–15)
BUN: 15 mg/dL (ref 8–23)
CO2: 27 mmol/L (ref 22–32)
Calcium: 9.5 mg/dL (ref 8.9–10.3)
Chloride: 104 mmol/L (ref 98–111)
Creatinine, Ser: 0.57 mg/dL (ref 0.44–1.00)
GFR, Estimated: >60 mL/min (ref >60)
Glucose, Bld: 113 mg/dL — ABNORMAL HIGH (ref 70–99)
Potassium: 4.1 mmol/L (ref 3.5–5.1)
Sodium: 136 mmol/L (ref 135–145)
Total Bilirubin: 0.3 mg/dL (ref 0.3–1.2)
Total Protein: 7.6 g/dL (ref 6.5–8.1)

## 2022-03-20 LAB — CBC WITH DIFFERENTIAL/PLATELET
Abs Immature Granulocytes: 0.01 10*3/uL (ref 0.00–0.07)
Basophils Absolute: 0 10*3/uL (ref 0.0–0.1)
Basophils Relative: 0 %
Eosinophils Absolute: 0.1 10*3/uL (ref 0.0–0.5)
Eosinophils Relative: 2 %
HCT: 34.2 % — ABNORMAL LOW (ref 36.0–46.0)
Hemoglobin: 11.5 g/dL — ABNORMAL LOW (ref 12.0–15.0)
Immature Granulocytes: 0 %
Lymphocytes Relative: 26 %
Lymphs Abs: 1.4 10*3/uL (ref 0.7–4.0)
MCH: 32 pg (ref 26.0–34.0)
MCHC: 33.6 g/dL (ref 30.0–36.0)
MCV: 95.3 fL (ref 80.0–100.0)
Monocytes Absolute: 0.7 10*3/uL (ref 0.1–1.0)
Monocytes Relative: 12 %
Neutro Abs: 3.2 10*3/uL (ref 1.7–7.7)
Neutrophils Relative %: 60 %
Platelets: 227 10*3/uL (ref 150–400)
RBC: 3.59 MIL/uL — ABNORMAL LOW (ref 3.87–5.11)
RDW: 15.2 % (ref 11.5–15.5)
WBC: 5.3 10*3/uL (ref 4.0–10.5)
nRBC: 0 % (ref 0.0–0.2)

## 2022-03-20 LAB — MAGNESIUM: Magnesium: 2 mg/dL (ref 1.7–2.4)

## 2022-03-20 LAB — CEA (IN HOUSE-CHCC): CEA (CHCC-In House): 17.77 ng/mL — ABNORMAL HIGH (ref 0.00–5.00)

## 2022-03-20 MED ORDER — SODIUM CHLORIDE 0.9% FLUSH
10.0000 mL | Freq: Once | INTRAVENOUS | Status: AC
  Administered 2022-03-20: 10 mL

## 2022-03-20 NOTE — Telephone Encounter (Signed)
Called patient to schedule f/u per 1/12 los notes. Patient notified.

## 2022-03-20 NOTE — Progress Notes (Signed)
DISCONTINUE ON PATHWAY REGIMEN - Colorectal     A cycle is every 14 days:     Bevacizumab-xxxx      Oxaliplatin      Leucovorin      Fluorouracil      Fluorouracil   **Always confirm dose/schedule in your pharmacy ordering system**  REASON: Disease Progression PRIOR TREATMENT: [id number]: [id number] + Bevacizumab q14 Days TREATMENT RESPONSE: Partial Response (PR)  START ON PATHWAY REGIMEN - Colorectal     A cycle is every 14 days:     Bevacizumab-xxxx      Irinotecan      Leucovorin      Fluorouracil      Fluorouracil   **Always confirm dose/schedule in your pharmacy ordering system**  Patient Characteristics: Distant Metastases, Nonsurgical Candidate, [name]/[name] Wild-Type (BRAF V600 Wild-Type/Unknown), Standard Cytotoxic Therapy, Second Line Standard Cytotoxic Therapy, Bevacizumab Eligible Tumor Location: Colon Therapeutic Status: Distant Metastases Microsatellite/Mismatch Repair Status: MSS/pMMR BRAF Mutation Status: Wild-Type (no mutation) [name]/[name] Mutation Status: Wild-Type (no mutation) Preferred Therapy Approach: Standard Cytotoxic Therapy Standard Cytotoxic Line of Therapy: Second Line Standard Cytotoxic Therapy Bevacizumab Eligibility: Eligible Intent of Therapy: Non-Curative / Palliative Intent, Discussed with Patient

## 2022-03-23 ENCOUNTER — Ambulatory Visit: Payer: Medicare (Managed Care) | Admitting: Hematology

## 2022-03-24 MED FILL — Dexamethasone Sodium Phosphate Inj 100 MG/10ML: INTRAMUSCULAR | Qty: 1 | Status: AC

## 2022-03-25 ENCOUNTER — Other Ambulatory Visit: Payer: Self-pay

## 2022-03-25 ENCOUNTER — Inpatient Hospital Stay: Payer: Medicare (Managed Care)

## 2022-03-25 VITALS — BP 128/82 | HR 55 | Temp 98.6°F | Resp 16 | Wt 177.8 lb

## 2022-03-25 DIAGNOSIS — Z95828 Presence of other vascular implants and grafts: Secondary | ICD-10-CM

## 2022-03-25 DIAGNOSIS — Z5112 Encounter for antineoplastic immunotherapy: Secondary | ICD-10-CM | POA: Diagnosis not present

## 2022-03-25 DIAGNOSIS — C221 Intrahepatic bile duct carcinoma: Secondary | ICD-10-CM

## 2022-03-25 DIAGNOSIS — C189 Malignant neoplasm of colon, unspecified: Secondary | ICD-10-CM

## 2022-03-25 LAB — CMP (CANCER CENTER ONLY)
ALT: 27 U/L (ref 0–44)
AST: 24 U/L (ref 15–41)
Albumin: 4 g/dL (ref 3.5–5.0)
Alkaline Phosphatase: 108 U/L (ref 38–126)
Anion gap: 5 (ref 5–15)
BUN: 13 mg/dL (ref 8–23)
CO2: 27 mmol/L (ref 22–32)
Calcium: 9.8 mg/dL (ref 8.9–10.3)
Chloride: 104 mmol/L (ref 98–111)
Creatinine: 0.62 mg/dL (ref 0.44–1.00)
GFR, Estimated: >60 mL/min (ref >60)
Glucose, Bld: 107 mg/dL — ABNORMAL HIGH (ref 70–99)
Potassium: 4 mmol/L (ref 3.5–5.1)
Sodium: 136 mmol/L (ref 135–145)
Total Bilirubin: 0.3 mg/dL (ref 0.3–1.2)
Total Protein: 7.6 g/dL (ref 6.5–8.1)

## 2022-03-25 LAB — CBC WITH DIFFERENTIAL (CANCER CENTER ONLY)
Abs Immature Granulocytes: 0.01 10*3/uL (ref 0.00–0.07)
Basophils Absolute: 0 10*3/uL (ref 0.0–0.1)
Basophils Relative: 0 %
Eosinophils Absolute: 0.1 10*3/uL (ref 0.0–0.5)
Eosinophils Relative: 2 %
HCT: 35.4 % — ABNORMAL LOW (ref 36.0–46.0)
Hemoglobin: 11.9 g/dL — ABNORMAL LOW (ref 12.0–15.0)
Immature Granulocytes: 0 %
Lymphocytes Relative: 27 %
Lymphs Abs: 1.2 10*3/uL (ref 0.7–4.0)
MCH: 31.6 pg (ref 26.0–34.0)
MCHC: 33.6 g/dL (ref 30.0–36.0)
MCV: 94.1 fL (ref 80.0–100.0)
Monocytes Absolute: 0.5 10*3/uL (ref 0.1–1.0)
Monocytes Relative: 10 %
Neutro Abs: 2.8 10*3/uL (ref 1.7–7.7)
Neutrophils Relative %: 61 %
Platelet Count: 209 10*3/uL (ref 150–400)
RBC: 3.76 MIL/uL — ABNORMAL LOW (ref 3.87–5.11)
RDW: 14.8 % (ref 11.5–15.5)
WBC Count: 4.6 10*3/uL (ref 4.0–10.5)
nRBC: 0 % (ref 0.0–0.2)

## 2022-03-25 LAB — MAGNESIUM: Magnesium: 2 mg/dL (ref 1.7–2.4)

## 2022-03-25 LAB — TOTAL PROTEIN, URINE DIPSTICK: Protein, ur: NEGATIVE mg/dL

## 2022-03-25 LAB — CEA (IN HOUSE-CHCC): CEA (CHCC-In House): 6.98 ng/mL — ABNORMAL HIGH (ref 0.00–5.00)

## 2022-03-25 MED ORDER — SODIUM CHLORIDE 0.9 % IV SOLN
2400.0000 mg/m2 | INTRAVENOUS | Status: DC
  Administered 2022-03-25: 4700 mg via INTRAVENOUS
  Filled 2022-03-25: qty 94

## 2022-03-25 MED ORDER — HEPARIN SOD (PORK) LOCK FLUSH 100 UNIT/ML IV SOLN: 500.0000 [IU] | Freq: Once | INTRAVENOUS | Status: DC | PRN

## 2022-03-25 MED ORDER — SODIUM CHLORIDE 0.9% FLUSH: 10.0000 mL | INTRAVENOUS | Status: DC | PRN

## 2022-03-25 MED ORDER — SODIUM CHLORIDE 0.9 % IV SOLN: Freq: Once | INTRAVENOUS | Status: AC

## 2022-03-25 MED ORDER — SODIUM CHLORIDE 0.9 % IV SOLN
5.0000 mg/kg | Freq: Once | INTRAVENOUS | Status: AC
  Administered 2022-03-25: 400 mg via INTRAVENOUS
  Filled 2022-03-25: qty 16

## 2022-03-25 MED ORDER — SODIUM CHLORIDE 0.9 % IV SOLN
400.0000 mg/m2 | Freq: Once | INTRAVENOUS | Status: AC
  Administered 2022-03-25: 780 mg via INTRAVENOUS
  Filled 2022-03-25: qty 39

## 2022-03-25 MED ORDER — SODIUM CHLORIDE 0.9% FLUSH
10.0000 mL | Freq: Once | INTRAVENOUS | Status: AC
  Administered 2022-03-25: 10 mL

## 2022-03-25 MED ORDER — PALONOSETRON HCL INJECTION 0.25 MG/5ML
0.2500 mg | Freq: Once | INTRAVENOUS | Status: AC
  Administered 2022-03-25: 0.25 mg via INTRAVENOUS
  Filled 2022-03-25: qty 5

## 2022-03-25 MED ORDER — SODIUM CHLORIDE 0.9 % IV SOLN
10.0000 mg | Freq: Once | INTRAVENOUS | Status: AC
  Administered 2022-03-25: 10 mg via INTRAVENOUS
  Filled 2022-03-25: qty 10

## 2022-03-25 MED ORDER — ATROPINE SULFATE 1 MG/ML IV SOLN
0.5000 mg | Freq: Once | INTRAVENOUS | Status: AC | PRN
  Administered 2022-03-25: 0.5 mg via INTRAVENOUS
  Filled 2022-03-25: qty 1

## 2022-03-25 MED ORDER — SODIUM CHLORIDE 0.9 % IV SOLN
150.0000 mg/m2 | Freq: Once | INTRAVENOUS | Status: AC
  Administered 2022-03-25: 300 mg via INTRAVENOUS
  Filled 2022-03-25: qty 15

## 2022-03-25 NOTE — Patient Instructions (Signed)
[facility]  Discharge Instructions: Thank you for choosing [facility] to provide your oncology and hematology care.   If you have a lab appointment with the [facility], please go directly to the [facility] and check in at the registration area.   Wear comfortable clothing and clothing appropriate for easy access to any Portacath or PICC line.   We strive to give you quality time with your provider. You may need to reschedule your appointment if you arrive late (15 or more minutes).  Arriving late affects you and other patients whose appointments are after yours.  Also, if you miss three or more appointments without notifying the office, you may be dismissed from the clinic at the provider's discretion.      For prescription refill requests, have your pharmacy contact our office and allow 72 hours for refills to be completed.    Today you received the following chemotherapy and/or immunotherapy agents: Bevacizumab, Irinotecan, Leucovorin, Fluorouracil      To help prevent nausea and vomiting after your treatment, we encourage you to take your nausea medication as directed.  BELOW ARE SYMPTOMS THAT SHOULD BE REPORTED IMMEDIATELY: *FEVER GREATER THAN 100.4 F (38 C) OR HIGHER *CHILLS OR SWEATING *NAUSEA AND VOMITING THAT IS NOT CONTROLLED WITH YOUR NAUSEA MEDICATION *UNUSUAL SHORTNESS OF BREATH *UNUSUAL BRUISING OR BLEEDING *URINARY PROBLEMS (pain or burning when urinating, or frequent urination) *BOWEL PROBLEMS (unusual diarrhea, constipation, pain near the anus) TENDERNESS IN MOUTH AND THROAT WITH OR WITHOUT PRESENCE OF ULCERS (sore throat, sores in mouth, or a toothache) UNUSUAL RASH, SWELLING OR PAIN  UNUSUAL VAGINAL DISCHARGE OR ITCHING   Items with * indicate a potential emergency and should be followed up as soon as possible or go to the Emergency Department if any problems should occur.  Please show the CHEMOTHERAPY ALERT CARD or  IMMUNOTHERAPY ALERT CARD at check-in to the Emergency Department and triage nurse.  Should you have questions after your visit or need to cancel or reschedule your appointment, please contact [facility]  Dept: [phone]  and follow the prompts.  Office hours are 8:00 a.m. to 4:30 p.m. Monday - Friday. Please note that voicemails left after 4:00 p.m. may not be returned until the following business day.  We are closed weekends and major holidays. You have access to a nurse at all times for urgent questions. Please call the main number to the clinic Dept: [phone] and follow the prompts.   For any non-urgent questions, you may also contact your provider using MyChart. We now offer e-Visits for anyone 60 and older to request care online for non-urgent symptoms. For details visit [email or web address].   Also download the MyChart app! Go to the app store, search "MyChart", open the app, select [facility], and log in with your MyChart username and password.  Bevacizumab Injection What is this medication? BEVACIZUMAB (be va SIZ yoo mab) treats some types of cancer. It works by blocking a protein that causes cancer cells to grow and multiply. This helps to slow or stop the spread of cancer cells. It is a monoclonal antibody. This medicine may be used for other purposes; ask your health care provider or pharmacist if you have questions. COMMON BRAND NAME(S): Alymsys, Avastin, MVASI, Noah Charon What should I tell my care team before I take this medication? They need to know if you have any of these conditions: Blood clots Coughing up blood Having or recent surgery Heart failure  High blood pressure History of a connection between 2 or more body parts that do not usually connect (fistula) History of a tear in your stomach or intestines Protein in your urine An unusual or allergic reaction to bevacizumab, other medications, foods, dyes, or preservatives Pregnant or  trying to get pregnant Breast-feeding How should I use this medication? This medication is injected into a vein. It is given by your care team in a hospital or clinic setting. Talk to your care team the use of this medication in children. Special care may be needed. Overdosage: If you think you have taken too much of this medicine contact a poison control center or emergency room at once. NOTE: This medicine is only for you. Do not share this medicine with others. What if I miss a dose? Keep appointments for follow-up doses. It is important not to miss your dose. Call your care team if you are unable to keep an appointment. What may interact with this medication? Interactions are not expected. This list may not describe all possible interactions. Give your health care provider a list of all the medicines, herbs, non-prescription drugs, or dietary supplements you use. Also tell them if you smoke, drink alcohol, or use illegal drugs. Some items may interact with your medicine. What should I watch for while using this medication? Your condition will be monitored carefully while you are receiving this medication. You may need blood work while taking this medication. This medication may make you feel generally unwell. This is not uncommon as chemotherapy can affect healthy cells as well as cancer cells. Report any side effects. Continue your course of treatment even though you feel ill unless your care team tells you to stop. This medication may increase your risk to bruise or bleed. Call your care team if you notice any unusual bleeding. Before having surgery, talk to your care team to make sure it is ok. This medication can increase the risk of poor healing of your surgical site or wound. You will need to stop this medication for 28 days before surgery. After surgery, wait at least 28 days before restarting this medication. Make sure the surgical site or wound is healed enough before restarting this  medication. Talk to your care team if questions. Talk to your care team if you may be pregnant. Serious birth defects can occur if you take this medication during pregnancy and for 6 months after the last dose. Contraception is recommended while taking this medication and for 6 months after the last dose. Your care team can help you find the option that works for you. Do not breastfeed while taking this medication and for 6 months after the last dose. This medication can cause infertility. Talk to your care team if you are concerned about your fertility. What side effects may I notice from receiving this medication? Side effects that you should report to your care team as soon as possible: Allergic reactions--skin rash, itching, hives, swelling of the face, lips, tongue, or throat Bleeding--bloody or black, tar-like stools, vomiting blood or brown material that looks like coffee grounds, red or dark brown urine, small red or purple spots on skin, unusual bruising or bleeding Blood clot--pain, swelling, or warmth in the leg, shortness of breath, chest pain Heart attack--pain or tightness in the chest, shoulders, arms, or jaw, nausea, shortness of breath, cold or clammy skin, feeling faint or lightheaded Heart failure--shortness of breath, swelling of the ankles, feet, or hands, sudden weight gain, unusual weakness or fatigue  Increase in blood pressure Infection--fever, chills, cough, sore throat, wounds that don't heal, pain or trouble when passing urine, general feeling of discomfort or being unwell Infusion reactions--chest pain, shortness of breath or trouble breathing, feeling faint or lightheaded Kidney injury--decrease in the amount of urine, swelling of the ankles, hands, or feet Stomach pain that is severe, does not go away, or gets worse Stroke--sudden numbness or weakness of the face, arm, or leg, trouble speaking, confusion, trouble walking, loss of balance or coordination, dizziness,  severe headache, change in vision Sudden and severe headache, confusion, change in vision, seizures, which may be signs of posterior reversible encephalopathy syndrome (PRES) Side effects that usually do not require medical attention (report to your care team if they continue or are bothersome): Back pain Change in taste Diarrhea Dry skin Increased tears Nosebleed This list may not describe all possible side effects. Call your doctor for medical advice about side effects. You may report side effects to FDA at 1-800-FDA-1088. Where should I keep my medication? This medication is given in a hospital or clinic. It will not be stored at home. NOTE: This sheet is a summary. It may not cover all possible information. If you have questions about this medicine, talk to your doctor, pharmacist, or health care provider.  2023 Elsevier/Gold Standard (2021-06-27 00:00:00)  Irinotecan Injection What is this medication? IRINOTECAN (ir in oh TEE kan) treats some types of cancer. It works by slowing down the growth of cancer cells. This medicine may be used for other purposes; ask your health care provider or pharmacist if you have questions. COMMON BRAND NAME(S): Camptosar What should I tell my care team before I take this medication? They need to know if you have any of these conditions: Dehydration Diarrhea Infection, especially a viral infection, such as chickenpox, cold sores, herpes Liver disease Low blood cell levels (white cells, red cells, and platelets) Low levels of electrolytes, such as calcium, magnesium, or potassium in your blood Recent or ongoing radiation An unusual or allergic reaction to irinotecan, other medications, foods, dyes, or preservatives If you or your partner are pregnant or trying to get pregnant Breast-feeding How should I use this medication? This medication is injected into a vein. It is given by your care team in a hospital or clinic setting. Talk to your care  team about the use of this medication in children. Special care may be needed. Overdosage: If you think you have taken too much of this medicine contact a poison control center or emergency room at once. NOTE: This medicine is only for you. Do not share this medicine with others. What if I miss a dose? Keep appointments for follow-up doses. It is important not to miss your dose. Call your care team if you are unable to keep an appointment. What may interact with this medication? Do not take this medication with any of the following: Cobicistat Itraconazole This medication may also interact with the following: Certain antibiotics, such as clarithromycin, rifampin, rifabutin Certain antivirals for HIV or AIDS Certain medications for fungal infections, such as ketoconazole, posaconazole, voriconazole Certain medications for seizures, such as carbamazepine, phenobarbital, phenytoin Gemfibrozil Nefazodone St. John's wort This list may not describe all possible interactions. Give your health care provider a list of all the medicines, herbs, non-prescription drugs, or dietary supplements you use. Also tell them if you smoke, drink alcohol, or use illegal drugs. Some items may interact with your medicine. What should I watch for while using this medication? Your condition  will be monitored carefully while you are receiving this medication. You may need blood work while taking this medication. This medication may make you feel generally unwell. This is not uncommon as chemotherapy can affect healthy cells as well as cancer cells. Report any side effects. Continue your course of treatment even though you feel ill unless your care team tells you to stop. This medication can cause serious side effects. To reduce the risk, your care team may give you other medications to take before receiving this one. Be sure to follow the directions from your care team. This medication may affect your coordination,  reaction time, or judgement. Do not drive or operate machinery until you know how this medication affects you. Sit up or stand slowly to reduce the risk of dizzy or fainting spells. Drinking alcohol with this medication can increase the risk of these side effects. This medication may increase your risk of getting an infection. Call your care team for advice if you get a fever, chills, sore throat, or other symptoms of a cold or flu. Do not treat yourself. Try to avoid being around people who are sick. Avoid taking medications that contain aspirin, acetaminophen, ibuprofen, naproxen, or ketoprofen unless instructed by your care team. These medications may hide a fever. This medication may increase your risk to bruise or bleed. Call your care team if you notice any unusual bleeding. Be careful brushing or flossing your teeth or using a toothpick because you may get an infection or bleed more easily. If you have any dental work done, tell your dentist you are receiving this medication. Talk to your care team if you or your partner are pregnant or think either of you might be pregnant. This medication can cause serious birth defects if taken during pregnancy and for 6 months after the last dose. You will need a negative pregnancy test before starting this medication. Contraception is recommended while taking this medication and for 6 months after the last dose. Your care team can help you find the option that works for you. Do not father a child while taking this medication and for 3 months after the last dose. Use a condom for contraception during this time period. Do not breastfeed while taking this medication and for 7 days after the last dose. This medication may cause infertility. Talk to your care team if you are concerned about your fertility. What side effects may I notice from receiving this medication? Side effects that you should report to your care team as soon as possible: Allergic  reactions--skin rash, itching, hives, swelling of the face, lips, tongue, or throat Dry cough, shortness of breath or trouble breathing Increased saliva or tears, increased sweating, stomach cramping, diarrhea, small pupils, unusual weakness or fatigue, slow heartbeat Infection--fever, chills, cough, sore throat, wounds that don't heal, pain or trouble when passing urine, general feeling of discomfort or being unwell Kidney injury--decrease in the amount of urine, swelling of the ankles, hands, or feet Low red blood cell level--unusual weakness or fatigue, dizziness, headache, trouble breathing Severe or prolonged diarrhea Unusual bruising or bleeding Side effects that usually do not require medical attention (report to your care team if they continue or are bothersome): Constipation Diarrhea Hair loss Loss of appetite Nausea Stomach pain This list may not describe all possible side effects. Call your doctor for medical advice about side effects. You may report side effects to FDA at 1-800-FDA-1088. Where should I keep my medication? This medication is given in a hospital  or clinic. It will not be stored at home. NOTE: This sheet is a summary. It may not cover all possible information. If you have questions about this medicine, talk to your doctor, pharmacist, or health care provider.  2023 Elsevier/Gold Standard (2021-07-03 00:00:00)

## 2022-03-26 NOTE — Progress Notes (Signed)
Lindsay [name]   [phone]) [phone] Fax:([phone]) [phone]   Clinic Follow up Note   Patient Care Team: [name], MD as PCP - General (Internal Medicine) [name], RN (Inactive) as Oncology Nurse Navigator [name], MD as Consulting Physician (Oncology) [name], MD as Consulting Physician (Gastroenterology)  Date of Service:  03/30/2022  I connected with Lindsay [name] on 1/63/8466 at  9:00 AM EST by telephone visit and verified that I am speaking with the correct person using two identifiers.  I discussed the limitations, risks, security and privacy concerns of performing an evaluation and management service by telephone and the availability of in person appointments. I also discussed with the patient that there may be a patient responsible charge related to this service. The patient expressed understanding and agreed to proceed.   Other persons participating in the visit and their role in the encounter:  No  Patient's location:  Home Provider's location:  Office  CHIEF COMPLAINT: f/u of metastatic colon cancer    CURRENT THERAPY:  -FOLFIRI +BEVA  ASSESSMENT & PLAN:  Lindsay [name] is a 67 y.o. female with    metastatic colon 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 Liver biopsy 01/08/20 confirmed metastasis from primary colorectal cancer. PET scan showed hypermetabolism to known liver mets, diffuse thoracic and abdominal lymphadenopathy, a 1.8 cm LUL pulmonary nodule, and splenic flexure of colon. -treated with first line FOLFOX 01/25/20 - 10/02/20, Vectibix added with C2. Oxali discontinued after C18 due to reaction. -switched to Xeloda 10/21/20, dose adjusted due to significant skin toxicity -due to cancer progression on recent staging scan, treatment has been changed to FOLFIRI and bevacizumab on 03/25/22  Peripheral  neuropathy due to chemotherapy [facility]) -started after cycle 5, oxaliplatin dose reduced and eventually discontinued after reaction 09/18/20 -Persistent numbness at the feet and hands, mild overall     PLAN: -Pt tolerated first cycle chemo well last week, we discussed diarrhea management  -to continue to take her magnesium pill until next visit  -lab,f/u and infusion 04/09/2022  SUMMARY OF ONCOLOGIC HISTORY: Oncology History Overview Note  Cancer Staging No matching staging information was found for the patient.    metastatic colon cancer to liver  06/20/2019 Procedure   Colonoscopy by Dr [name] 06/20/19  IMPRESSION -Seven 3 to 10 mm polyps in the sigmoid colon, in the transverse colon and in the escending colon, removed with a cold snare. Resected and retrireved.  -One 11m polyp in the descending colon. Biopsies. Tattoes.  -Mediaum sized lipoma in the ascending colon.   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 See Comment B. Colon, Ascending, polyp, Polypectomy:  -TUBULAR ADENOMA -No high grade dysplasia or malignancy.  C. Colon, TRansverse, Polyo, polectomy:  -TUBULAR ADENOMA -No high grade dysplasia or malignancy.  D. Colon, Sigmoid, Polyp, Polypectomy:  -HYPERPLASTIC POLYP   11/07/2019 Imaging   [place] 11/07/19  IMPRESSION: 1. Two solid masses in the liver are nonspecific. Recommend MRI abdomen with and without contrast for further evaluation.   12/15/2019 Imaging   MRI Abdomen 12/15/19  IMPRESSION: 1. There are two large masses in the liver with appearance favoring metastatic disease or hepatocellular carcinoma or cholangiocarcinoma. A benign etiology is highly unlikely given the enhancement pattern and associated adenopathy. 2. Considerable porta hepatis and retroperitoneal adenopathy. Some  of the confluent porta hepatis tumor is potentially infiltrative and abuts the pancreatic  body along its right upper margin, making it difficult to completely exclude the possibility of pancreatic adenocarcinoma primary. Possibilities helpful in further workup might include tissue diagnosis, endoscopic ultrasound, or nuclear medicine PET-CT. 3. Pancreas divisum. 4. Lumbar spondylosis and degenerative disc disease. 5. Despite efforts by the technologist and patient, motion artifact is present on today's exam and could not be eliminated. This reduces exam sensitivity and specificity.   12/22/2019 Procedure   Upper Endoscopy by Dr Benson [place] 12/22/19  IMPRESSION - One lymph node was visualized and measured in the porta hepatis region. Fine needle aspiration performed    12/22/2019 Initial Biopsy   A. 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 LIVER, LEFT LOBE, BIOPSY:  - Metastatic adenocarcinoma, consistent with a colorectal primary.  See  comment      COMMENT:   Immunohistochemical stains show the tumor cells are positive for CK20  and CDX2 but negative for CK7, consistent with above interpretation.  Dr. Burr Medico was notified on 01/10/2020   01/08/2020 Genetic Testing   [name]  [name] wildtype and [name]/[name] mutations which make her eligible for target biological agent Vectibix.   01/24/2020 Procedure   PAC placed 01/24/20   01/25/2020 -  Chemotherapy   first line FOLFOX starting 01/25/2020, Vextibix  added with C2 (02/06/20)   02/06/2020 - 02/06/2022 Chemotherapy   Patient is on Treatment Plan : COLORECTAL Panitumumab q14d ([place] - Type Gene Only)     06/20/2020 Imaging   CT CAP  IMPRESSION: 1. Continued interval reduction in size and conspicuity of a subsolid nodule of the peripheral left upper lobe. 2.  Unchanged prominent pretracheal and subcarinal lymph nodes. 3. Redemonstrated partially calcified low-attenuation liver masses, slightly decreased in size compared to prior examination. 4. Slight interval decrease in size of a portacaval lymph node or conglomerate and retroperitoneal lymph nodes. 5. Findings are consistent with continued treatment response of nodal, pulmonary, and hepatic metastatic disease. 6. Coronary artery disease.   Aortic Atherosclerosis (ICD10-I70.0).     10/09/2020 Imaging   IMPRESSION: 1. Slight decrease in size of dominant liver mass with smaller liver mass within 1-2 mm of prior measurement. 2. Stable appearance of celiac lymph and retroperitoneal lymph nodes. Dominant node with calcification in the gastrohepatic ligament as described. 3. Continued decrease in size of LEFT upper lobe nodule. 4. Three-vessel coronary artery calcification. 5. Aortic atherosclerosis.   03/25/2022 -  Chemotherapy   Patient is on Treatment Plan : COLORECTAL FOLFIRI + Bevacizumab q14d        INTERVAL HISTORY:  Lindsay [name] was contacted for a follow up of metastatic colon cancer   She was last seen by me on 03/20/2022.  Pt reports that her first cycle of chemo went pretty good. Pt states she still has some diarrhea 3 x a day. She takes imodium once a day. She is eating a drinking very well. Pt states she got nauseous one time and was able to take something and she felt better. Pt Has no further questions.   All other systems were reviewed with the patient and are negative.  MEDICAL HISTORY:  Past Medical History:  Diagnosis Date   Arthritis    Diabetes mellitus without complication ([place])  Hypertension    met colon ca to liver 01/2020    SURGICAL HISTORY: Past Surgical History:  Procedure Laterality Date   ABDOMINAL HYSTERECTOMY     ANTERIOR AND POSTERIOR REPAIR WITH SACROSPINOUS FIXATION N/A 07/16/2016   Procedure: ANTERIOR AND POSTERIOR REPAIR WITH SACROSPINOUS  FIXATION;  Surgeon: Everlene Farrier, MD;  Location: [facility];  Service: Gynecology;  Laterality: N/A;   CYSTOSCOPY  07/16/2016   Procedure: CYSTOSCOPY;  Surgeon: Everlene Farrier, MD;  Location: [facility];  Service: Gynecology;;   ESOPHAGOGASTRODUODENOSCOPY (EGD) WITH PROPOFOL N/A 12/22/2019   Procedure: ESOPHAGOGASTRODUODENOSCOPY (EGD) WITH PROPOFOL;  Surgeon: [name], MD;  Location: [facility];  Service: Endoscopy;  Laterality: N/A;   FINE NEEDLE ASPIRATION N/A 12/22/2019   Procedure: FINE NEEDLE ASPIRATION (FNA) LINEAR;  Surgeon: [name], MD;  Location: [facility];  Service: Endoscopy;  Laterality: N/A;   IR IMAGING GUIDED PORT INSERTION  01/24/2020   LAPAROSCOPIC VAGINAL HYSTERECTOMY WITH SALPINGECTOMY Bilateral 07/16/2016   Procedure: LAPAROSCOPIC ASSISTED VAGINAL HYSTERECTOMY WITH SALPINGECTOMY;  Surgeon: Everlene Farrier, MD;  Location: [facility];  Service: Gynecology;  Laterality: Bilateral;   MYOMECTOMY ABDOMINAL APPROACH     THYROID SURGERY     tyroid     UPPER ESOPHAGEAL ENDOSCOPIC ULTRASOUND (EUS) N/A 12/22/2019   Procedure: UPPER ESOPHAGEAL ENDOSCOPIC ULTRASOUND (EUS);  Surgeon: [name], MD;  Location: [facility];  Service: Endoscopy;  Laterality: N/A;    I have reviewed the social history and family history with the patient and they are unchanged from previous note.  ALLERGIES:  is allergic to oxaliplatin.  MEDICATIONS:  Current Outpatient Medications  Medication Sig Dispense Refill   aspirin EC 81 MG tablet Take 1 tablet (81 mg total) by mouth daily. 90 tablet 3   atorvastatin (LIPITOR) 20 MG tablet Take 20 mg by mouth at bedtime.   1   capecitabine (XELODA) 500 MG tablet TAKE 3 TABLETS BY MOUTH 2 TIMES A DAY AFTER A MEAL FOR 7 DAYS ON, FOLLOWED BY 7 DAYS OFF. REPEAT EVERY 14 DAYS. 84 tablet 1   clindamycin (CLINDAGEL) 1 % gel Apply topically 2 (two) times daily. 60 g 2   clobetasol cream (TEMOVATE) 8.14 % Apply 1 application topically daily.     esomeprazole  (NEXIUM) 40 MG capsule Take 40 mg by mouth daily.     hydrochlorothiazide (MICROZIDE) 12.5 MG capsule Take 12.5 mg by mouth daily.     KLOR-CON M20 20 MEQ tablet TAKE 1 TABLET TWICE A DAY 180 tablet 3   lidocaine-prilocaine (EMLA) cream Apply 1 application topically as needed. 30 g 1   magic mouthwash w/lidocaine SOLN Take 5 mLs by mouth 4 (four) times daily. 475 mL 2   magnesium oxide (MAG-OX) 400 (240 Mg) MG tablet Take 2 tablets (800 mg total) by mouth 4 (four) times daily. 240 tablet 3   MAGNESIUM-OXIDE 400 (240 Mg) MG tablet TAKE 2 TABLETS BY MOUTH THREE TIMES DAILY 180 tablet 0   omeprazole (PRILOSEC) 20 MG capsule Take 1 capsule (20 mg total) by mouth daily. 30 capsule 2   ondansetron (ZOFRAN) 8 MG tablet TAKE 1 TABLET BY MOUTH EVERY 8 HOURS AS NEEDED FOR NAUSEA OR VOMITING 20 tablet 0   prochlorperazine (COMPAZINE) 10 MG tablet Take 1 tablet (10 mg total) by mouth every 6 (six) hours as needed for nausea or vomiting. 30 tablet 2   spironolactone (ALDACTONE) 25 MG tablet Take 25 mg by mouth daily with breakfast.     urea (CARMOL) 10 % cream Apply topically 2 (  two) times daily. 71 g 2   valACYclovir (VALTREX) 1000 MG tablet Take 1 tablet (1,000 mg total) by mouth 2 (two) times daily. 10 tablet 0   verapamil (CALAN) 120 MG tablet Take 120 mg by mouth 3 (three) times daily.     No current facility-administered medications for this visit.    PHYSICAL EXAMINATION: ECOG PERFORMANCE STATUS: 1 - Symptomatic but completely ambulatory  There were no vitals filed for this visit. Wt Readings from Last 3 Encounters:  03/25/22 177 lb 12 oz (80.6 kg)  03/20/22 180 lb 3.2 oz (81.7 kg)  02/27/22 179 lb 6.4 oz (81.4 kg)     No vitals taken today, Exam not performed today  LABORATORY DATA:  I have reviewed the data as listed    Latest Ref Rng & Units 03/25/2022    9:06 AM 03/20/2022    7:48 AM 03/17/2022   11:09 AM  CBC  WBC 4.0 - 10.5 K/uL 4.6  5.3  5.0   Hemoglobin 12.0 - 15.0 g/dL 11.9   11.5  12.0   Hematocrit 36.0 - 46.0 % 35.4  34.2  34.9   Platelets 150 - 400 K/uL 209  227  236         Latest Ref Rng & Units 03/25/2022    9:06 AM 03/20/2022    7:48 AM 03/17/2022   11:09 AM  CMP  Glucose 70 - 99 mg/dL 107  113  100   BUN 8 - 23 mg/dL '13  15  14   '$ Creatinine 0.44 - 1.00 mg/dL 0.62  0.57  0.63   Sodium 135 - 145 mmol/L 136  136  138   Potassium 3.5 - 5.1 mmol/L 4.0  4.1  4.1   Chloride 98 - 111 mmol/L 104  104  105   CO2 22 - 32 mmol/L '27  27  27   '$ Calcium 8.9 - 10.3 mg/dL 9.8  9.5  9.8   Total Protein 6.5 - 8.1 g/dL 7.6  7.6  8.0   Total Bilirubin 0.3 - 1.2 mg/dL 0.3  0.3  0.3   Alkaline Phos 38 - 126 U/L 108  109  106   AST 15 - 41 U/L '24  25  23   '$ ALT 0 - 44 U/L '27  27  27       '$ RADIOGRAPHIC STUDIES: I have personally reviewed the radiological images as listed and agreed with the findings in the report. No results found.    Orders Placed This Encounter  Procedures   CBC with Differential ([place] Only)    Standing Status:   Future    Standing Expiration Date:   04/24/2023   CMP ([place] only)    Standing Status:   Future    Standing Expiration Date:   04/24/2023   Total Protein, Urine dipstick    Standing Status:   Future    Standing Expiration Date:   04/24/2023   CBC with Differential ([place] Only)    Standing Status:   Future    Standing Expiration Date:   05/08/2023   CMP ([place] only)    Standing Status:   Future    Standing Expiration Date:   05/08/2023   All questions were answered. The patient knows to call the clinic with any problems, questions or concerns. No barriers to learning was detected. The total time spent in the appointment was 8  minutes.     [name], MD 03/30/2022   Melodye Ped  McNairy am acting as scribe for [name], MD.   I have reviewed the above documentation for accuracy and completeness, and I agree with the above.

## 2022-03-27 ENCOUNTER — Inpatient Hospital Stay: Payer: Medicare (Managed Care)

## 2022-03-27 VITALS — BP 126/78 | HR 56 | Temp 98.7°F | Resp 18

## 2022-03-27 DIAGNOSIS — C221 Intrahepatic bile duct carcinoma: Secondary | ICD-10-CM

## 2022-03-27 DIAGNOSIS — Z5112 Encounter for antineoplastic immunotherapy: Secondary | ICD-10-CM | POA: Diagnosis not present

## 2022-03-27 DIAGNOSIS — Z95828 Presence of other vascular implants and grafts: Secondary | ICD-10-CM

## 2022-03-27 MED ORDER — SODIUM CHLORIDE 0.9% FLUSH
10.0000 mL | Freq: Once | INTRAVENOUS | Status: AC
  Administered 2022-03-27: 10 mL

## 2022-03-27 MED ORDER — HEPARIN SOD (PORK) LOCK FLUSH 100 UNIT/ML IV SOLN
500.0000 [IU] | Freq: Once | INTRAVENOUS | Status: AC
  Administered 2022-03-27: 500 [IU]

## 2022-03-29 NOTE — Assessment & Plan Note (Signed)
MSS, [name]/[name] wildtype -diagnosed 12/2019 by porta hepatis LN biopsy during EUS for work up of abdominal pain and liver lesions seen on [place] and MRI. Liver biopsy 01/08/20 confirmed metastasis from primary colorectal cancer. PET scan showed hypermetabolism to known liver mets, diffuse thoracic and abdominal lymphadenopathy, a 1.8 cm LUL pulmonary nodule, and splenic flexure of colon. -treated with first line FOLFOX 01/25/20 - 10/02/20, Vectibix added with C2. Oxali discontinued after C18 due to reaction. -switched to Xeloda 10/21/20, dose adjusted due to significant skin toxicity -due to cancer progression on recent staging scan, treatment has been changed to FOLFIRI and bevacizumab on 03/25/22

## 2022-03-29 NOTE — Assessment & Plan Note (Signed)
-  started after cycle 5, oxaliplatin dose reduced and eventually discontinued after reaction 09/18/20 -Persistent numbness at the feet and hands, mild overall

## 2022-03-30 ENCOUNTER — Encounter: Payer: Self-pay | Admitting: Hematology

## 2022-03-30 ENCOUNTER — Inpatient Hospital Stay (HOSPITAL_BASED_OUTPATIENT_CLINIC_OR_DEPARTMENT_OTHER): Payer: Medicare (Managed Care) | Admitting: Hematology

## 2022-03-30 DIAGNOSIS — G620 Drug-induced polyneuropathy: Secondary | ICD-10-CM | POA: Diagnosis not present

## 2022-03-30 DIAGNOSIS — T451X5D Adverse effect of antineoplastic and immunosuppressive drugs, subsequent encounter: Secondary | ICD-10-CM

## 2022-03-30 DIAGNOSIS — C221 Intrahepatic bile duct carcinoma: Secondary | ICD-10-CM

## 2022-04-01 ENCOUNTER — Telehealth: Payer: Self-pay | Admitting: Hematology

## 2022-04-01 NOTE — Telephone Encounter (Signed)
Left patient a vm regarding upcoming appointments

## 2022-04-08 MED FILL — Dexamethasone Sodium Phosphate Inj 100 MG/10ML: INTRAMUSCULAR | Qty: 1 | Status: AC

## 2022-04-08 NOTE — Assessment & Plan Note (Signed)
MSS, [name]/[name] wildtype -diagnosed 12/2019 by porta hepatis LN biopsy during EUS for work up of abdominal pain and liver lesions seen on [place] and MRI. Liver biopsy 01/08/20 confirmed metastasis from primary colorectal cancer. PET scan showed hypermetabolism to known liver mets, diffuse thoracic and abdominal lymphadenopathy, a 1.8 cm LUL pulmonary nodule, and splenic flexure of colon. -treated with first line FOLFOX 01/25/20 - 10/02/20, Vectibix added with C2. Oxali discontinued after C18 due to reaction. -switched to Xeloda 10/21/20, dose adjusted due to significant skin toxicity -due to cancer progression on recent staging scan, treatment has been changed to FOLFIRI and bevacizumab on 03/25/22

## 2022-04-08 NOTE — Assessment & Plan Note (Signed)
-  started after cycle 5, oxaliplatin dose reduced and eventually discontinued after reaction 09/18/20 -Persistent numbness at the feet and hands, mild overall

## 2022-04-08 NOTE — Progress Notes (Unsigned)
Lindsay [name]   Telephone:([phone]) [phone] Fax:(336) [phone]   Clinic Follow up Note   Patient Care Team: [name], MD as PCP - General (Internal Medicine) [name], RN (Inactive) as Oncology Nurse Navigator [name], MD as Consulting Physician (Oncology) [name], MD as Consulting Physician (Gastroenterology)  Date of Service:  04/09/2022  CHIEF COMPLAINT: f/u of metastatic colon cancer    CURRENT THERAPY:  -FOLFIRI +BEVA   ASSESSMENT:  Lindsay [name] is a 67 y.o. female with   metastatic colon 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 Liver biopsy 01/08/20 confirmed metastasis from primary colorectal cancer. PET scan showed hypermetabolism to known liver mets, diffuse thoracic and abdominal lymphadenopathy, a 1.8 cm LUL pulmonary nodule, and splenic flexure of colon. -treated with first line FOLFOX 01/25/20 - 10/02/20, Vectibix added with C2. Oxali discontinued after C18 due to reaction. -switched to Xeloda 10/21/20, dose adjusted due to significant skin toxicity -due to cancer progression on recent staging scan, treatment has been changed to FOLFIRI and bevacizumab on 03/25/22  Peripheral neuropathy due to chemotherapy [facility]) -started after cycle 5, oxaliplatin dose reduced and eventually discontinued after reaction 09/18/20 -Persistent numbness at the feet and hands, mild overall      PLAN: -she is doing well, tolerated first cycle chemo well -labs pending - proceed with C2 Folfiri+ beva Increase dose to full due to patient tolerating well. -lab,flush,f/u and FOLOFIRI+ BEVA 04/22/2022    SUMMARY OF ONCOLOGIC HISTORY: Oncology History Overview Note  Cancer Staging No matching staging information was found for the patient.    metastatic colon cancer to liver  06/20/2019 Procedure   Colonoscopy by Dr [name] 06/20/19   IMPRESSION -Seven 3 to 10 mm polyps in the sigmoid colon, in the transverse colon and in the escending colon, removed with a cold snare. Resected and retrireved.  -One 37m polyp in the descending colon. Biopsies. Tattoes.  -Mediaum sized lipoma in the ascending colon.   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 See Comment B. Colon, Ascending, polyp, Polypectomy:  -TUBULAR ADENOMA -No high grade dysplasia or malignancy.  C. Colon, TRansverse, Polyo, polectomy:  -TUBULAR ADENOMA -No high grade dysplasia or malignancy.  D. Colon, Sigmoid, Polyp, Polypectomy:  -HYPERPLASTIC POLYP   11/07/2019 Imaging   [place] 11/07[place]19  IMPRESSION: 1. Two solid masses in the liver are nonspecific. Recommend MRI abdomen with and without contrast for further evaluation.   12/15/2019 Imaging   MRI Abdomen 12/15/19  IMPRESSION: 1. There are two large masses in the liver with appearance favoring metastatic disease or hepatocellular carcinoma or cholangiocarcinoma. A benign etiology is highly unlikely given the enhancement pattern and associated adenopathy. 2. Considerable porta hepatis and retroperitoneal adenopathy. Some of the confluent porta hepatis tumor is potentially infiltrative and abuts the pancreatic body along its right upper margin, making it difficult to completely exclude the possibility of pancreatic adenocarcinoma primary. Possibilities helpful in further workup might include tissue diagnosis, endoscopic ultrasound, or nuclear medicine PET-CT. 3. Pancreas divisum. 4. Lumbar spondylosis and degenerative disc disease. 5. Despite efforts by the technologist and patient, motion artifact is present on today's exam and could not be eliminated. This reduces exam sensitivity and specificity.   12/22/2019 Procedure   Upper Endoscopy by Dr [name]10/15/21  IMPRESSION - One lymph node was visualized and  measured in the porta hepatis region. Fine needle aspiration performed    12/22/2019 Initial Biopsy   A. LIVER, PORTA HEPATIS MASS, FINE NEEDLE 12/22/19 ASPIRATION:  FINAL MICROSCOPIC DIAGNOSIS:  - Malignant cells consistent with metastatic adenocarcinoma   01/01/2020 Initial Diagnosis   Intrahepatic cholangiocarcinoma ([place])   01/08/2020 Initial Biopsy   FINAL MICROSCOPIC DIAGNOSIS:   A. LIVER, LEFT LOBE, BIOPSY:  - Metastatic adenocarcinoma, consistent with a colorectal primary.  See  comment      COMMENT:   Immunohistochemical stains show the tumor cells are positive for CK20  and CDX2 but negative for CK7, consistent with above interpretation.  Dr. Burr Medico was notified on 01/10/2020   01/08/2020 Genetic Testing   [name]  [name] wildtype and [name]/[name] mutations which make her eligible for target biological agent Vectibix.   01/24/2020 Procedure   PAC placed 01/24/20   01/25/2020 -  Chemotherapy   first line FOLFOX starting 01/25/2020, Vextibix  added with C2 (02/06/20)   02/06/2020 - 02/06/2022 Chemotherapy   Patient is on Treatment Plan : COLORECTAL Panitumumab q14d ([place] - Type Gene Only)     06/20/2020 Imaging   CT CAP  IMPRESSION: 1. Continued interval reduction in size and conspicuity of a subsolid nodule of the peripheral left upper lobe. 2. Unchanged prominent pretracheal and subcarinal lymph nodes. 3. Redemonstrated partially calcified low-attenuation liver masses, slightly decreased in size compared to prior examination. 4. Slight interval decrease in size of a portacaval lymph node or conglomerate and retroperitoneal lymph nodes. 5. Findings are consistent with continued treatment response of nodal, pulmonary, and hepatic metastatic disease. 6. Coronary artery disease.   Aortic Atherosclerosis (ICD10-I70.0).     10/09/2020 Imaging   IMPRESSION: 1. Slight decrease in size of dominant liver mass with smaller liver mass within 1-2 mm of prior  measurement. 2. Stable appearance of celiac lymph and retroperitoneal lymph nodes. Dominant node with calcification in the gastrohepatic ligament as described. 3. Continued decrease in size of LEFT upper lobe nodule. 4. Three-vessel coronary artery calcification. 5. Aortic atherosclerosis.   03/25/2022 -  Chemotherapy   Patient is on Treatment Plan : COLORECTAL FOLFIRI + Bevacizumab q14d        INTERVAL HISTORY:  Lindsay [name] is here for a follow up of  metastatic colon cancer  She was last seen by me on 03/30/2022 She presents to the clinic alone. Pt states she is doing well from the last treatment. Pt reports of having diarrhea three times a day.. After chemo the BM is about the same.  All other systems were reviewed with the patient and are negative.  MEDICAL HISTORY:  Past Medical History:  Diagnosis Date   Arthritis    Diabetes mellitus without complication ([place])    Hypertension    met colon ca to liver 01/2020    SURGICAL HISTORY: Past Surgical History:  Procedure Laterality Date   ABDOMINAL HYSTERECTOMY     ANTERIOR AND POSTERIOR REPAIR WITH SACROSPINOUS FIXATION N/A 07/16/2016   Procedure: ANTERIOR AND POSTERIOR REPAIR WITH SACROSPINOUS FIXATION;  Surgeon: Everlene Farrier, MD;  Location: [facility];  Service: Gynecology;  Laterality: N/A;   CYSTOSCOPY  07/16/2016   Procedure: CYSTOSCOPY;  Surgeon: Everlene Farrier, MD;  Location: [facility];  Service: Gynecology;;   ESOPHAGOGASTRODUODENOSCOPY (EGD) WITH PROPOFOL N/A 12/22/2019   Procedure: ESOPHAGOGASTRODUODENOSCOPY (EGD) WITH PROPOFOL;  Surgeon: [name], MD;  Location: [facility];  Service: Endoscopy;  Laterality: N/A;   FINE NEEDLE ASPIRATION N/A 12/22/2019   Procedure: FINE NEEDLE ASPIRATION (  FNA) LINEAR;  Surgeon: [name], MD;  Location: [facility];  Service: Endoscopy;  Laterality: N/A;   IR IMAGING GUIDED PORT INSERTION  01/24/2020   LAPAROSCOPIC VAGINAL HYSTERECTOMY WITH SALPINGECTOMY Bilateral 07/16/2016    Procedure: LAPAROSCOPIC ASSISTED VAGINAL HYSTERECTOMY WITH SALPINGECTOMY;  Surgeon: Everlene Farrier, MD;  Location: [facility];  Service: Gynecology;  Laterality: Bilateral;   MYOMECTOMY ABDOMINAL APPROACH     THYROID SURGERY     tyroid     UPPER ESOPHAGEAL ENDOSCOPIC ULTRASOUND (EUS) N/A 12/22/2019   Procedure: UPPER ESOPHAGEAL ENDOSCOPIC ULTRASOUND (EUS);  Surgeon: [name], MD;  Location: [facility];  Service: Endoscopy;  Laterality: N/A;    I have reviewed the social history and family history with the patient and they are unchanged from previous note.  ALLERGIES:  is allergic to oxaliplatin.  MEDICATIONS:  Current Outpatient Medications  Medication Sig Dispense Refill   aspirin EC 81 MG tablet Take 1 tablet (81 mg total) by mouth daily. 90 tablet 3   atorvastatin (LIPITOR) 20 MG tablet Take 20 mg by mouth at bedtime.   1   capecitabine (XELODA) 500 MG tablet TAKE 3 TABLETS BY MOUTH 2 TIMES A DAY AFTER A MEAL FOR 7 DAYS ON, FOLLOWED BY 7 DAYS OFF. REPEAT EVERY 14 DAYS. 84 tablet 1   clindamycin (CLINDAGEL) 1 % gel Apply topically 2 (two) times daily. 60 g 2   clobetasol cream (TEMOVATE) 5.36 % Apply 1 application topically daily.     esomeprazole (NEXIUM) 40 MG capsule Take 40 mg by mouth daily.     hydrochlorothiazide (MICROZIDE) 12.5 MG capsule Take 12.5 mg by mouth daily.     KLOR-CON M20 20 MEQ tablet TAKE 1 TABLET TWICE A DAY 180 tablet 3   lidocaine-prilocaine (EMLA) cream Apply 1 application topically as needed. 30 g 1   magic mouthwash w/lidocaine SOLN Take 5 mLs by mouth 4 (four) times daily. 475 mL 2   magnesium oxide (MAG-OX) 400 (240 Mg) MG tablet Take 2 tablets (800 mg total) by mouth 4 (four) times daily. 240 tablet 3   MAGNESIUM-OXIDE 400 (240 Mg) MG tablet TAKE 2 TABLETS BY MOUTH THREE TIMES DAILY 180 tablet 0   omeprazole (PRILOSEC) 20 MG capsule Take 1 capsule (20 mg total) by mouth daily. 30 capsule 2   ondansetron (ZOFRAN) 8 MG tablet TAKE 1 TABLET BY MOUTH EVERY  8 HOURS AS NEEDED FOR NAUSEA OR VOMITING 20 tablet 0   prochlorperazine (COMPAZINE) 10 MG tablet Take 1 tablet (10 mg total) by mouth every 6 (six) hours as needed for nausea or vomiting. 30 tablet 2   spironolactone (ALDACTONE) 25 MG tablet Take 25 mg by mouth daily with breakfast.     urea (CARMOL) 10 % cream Apply topically 2 (two) times daily. 71 g 2   valACYclovir (VALTREX) 1000 MG tablet Take 1 tablet (1,000 mg total) by mouth 2 (two) times daily. 10 tablet 0   verapamil (CALAN) 120 MG tablet Take 120 mg by mouth 3 (three) times daily.     No current facility-administered medications for this visit.    PHYSICAL EXAMINATION: ECOG PERFORMANCE STATUS: 0 - Asymptomatic  Vitals:   04/09/22 0819  BP: (!) 137/90  Pulse: 72  Resp: 18  Temp: 98 F (36.7 C)  SpO2: 97%   Wt Readings from Last 3 Encounters:  04/09/22 179 lb 8 oz (81.4 kg)  03/25/22 177 lb 12 oz (80.6 kg)  03/20/22 180 lb 3.2 oz (81.7 kg)     GENERAL:alert,  no distress and comfortable SKIN: skin color normal, no rashes or significant lesions EYES: normal, Conjunctiva are pink and non-injected, sclera clear  NEURO: alert & oriented x 3 with fluent speech   LABORATORY DATA:  I have reviewed the data as listed    Latest Ref Rng & Units 04/09/2022    7:59 AM 03/25/2022    9:06 AM 03/20/2022    7:48 AM  CBC  WBC 4.0 - 10.5 K/uL 3.5  4.6  5.3   Hemoglobin 12.0 - 15.0 g/dL 12.0  11.9  11.5   Hematocrit 36.0 - 46.0 % 35.7  35.4  34.2   Platelets 150 - 400 K/uL 263  209  227         Latest Ref Rng & Units 03/25/2022    9:06 AM 03/20/2022    7:48 AM 03/17/2022   11:09 AM  CMP  Glucose 70 - 99 mg/dL 107  113  100   BUN 8 - 23 mg/dL '13  15  14   '$ Creatinine 0.44 - 1.00 mg/dL 0.62  0.57  0.63   Sodium 135 - 145 mmol/L 136  136  138   Potassium 3.5 - 5.1 mmol/L 4.0  4.1  4.1   Chloride 98 - 111 mmol/L 104  104  105   CO2 22 - 32 mmol/L '27  27  27   '$ Calcium 8.9 - 10.3 mg/dL 9.8  9.5  9.8   Total Protein 6.5 - 8.1 g/dL  7.6  7.6  8.0   Total Bilirubin 0.3 - 1.2 mg/dL 0.3  0.3  0.3   Alkaline Phos 38 - 126 U/L 108  109  106   AST 15 - 41 U/L '24  25  23   '$ ALT 0 - 44 U/L '27  27  27       '$ RADIOGRAPHIC STUDIES: I have personally reviewed the radiological images as listed and agreed with the findings in the report. No results found.    Orders Placed This Encounter  Procedures   CBC with Differential ([place] Only)    Standing Status:   Future    Standing Expiration Date:   05/22/2023   CMP ([place] only)    Standing Status:   Future    Standing Expiration Date:   05/22/2023   All questions were answered. The patient knows to call the clinic with any problems, questions or concerns. No barriers to learning was detected. The total time spent in the appointment was 30 minutes.     [name], MD 04/09/2022   Felicity Coyer, CMA, am acting as scribe for [name], MD.   I have reviewed the above documentation for accuracy and completeness, and I agree with the above.

## 2022-04-09 ENCOUNTER — Inpatient Hospital Stay: Payer: Medicare (Managed Care) | Attending: Hematology

## 2022-04-09 ENCOUNTER — Inpatient Hospital Stay (HOSPITAL_BASED_OUTPATIENT_CLINIC_OR_DEPARTMENT_OTHER): Payer: Medicare (Managed Care) | Admitting: Hematology

## 2022-04-09 ENCOUNTER — Encounter: Payer: Self-pay | Admitting: Hematology

## 2022-04-09 ENCOUNTER — Inpatient Hospital Stay: Payer: Medicare (Managed Care)

## 2022-04-09 VITALS — BP 137/90 | HR 72 | Temp 98.0°F | Resp 18 | Ht 66.0 in | Wt 179.5 lb

## 2022-04-09 DIAGNOSIS — Z7982 Long term (current) use of aspirin: Secondary | ICD-10-CM | POA: Diagnosis not present

## 2022-04-09 DIAGNOSIS — G620 Drug-induced polyneuropathy: Secondary | ICD-10-CM | POA: Insufficient documentation

## 2022-04-09 DIAGNOSIS — Z5111 Encounter for antineoplastic chemotherapy: Secondary | ICD-10-CM | POA: Insufficient documentation

## 2022-04-09 DIAGNOSIS — E119 Type 2 diabetes mellitus without complications: Secondary | ICD-10-CM | POA: Diagnosis not present

## 2022-04-09 DIAGNOSIS — Z5112 Encounter for antineoplastic immunotherapy: Secondary | ICD-10-CM | POA: Diagnosis present

## 2022-04-09 DIAGNOSIS — Z452 Encounter for adjustment and management of vascular access device: Secondary | ICD-10-CM | POA: Diagnosis not present

## 2022-04-09 DIAGNOSIS — Z79624 Long term (current) use of inhibitors of nucleotide synthesis: Secondary | ICD-10-CM | POA: Insufficient documentation

## 2022-04-09 DIAGNOSIS — C221 Intrahepatic bile duct carcinoma: Secondary | ICD-10-CM

## 2022-04-09 DIAGNOSIS — Z95828 Presence of other vascular implants and grafts: Secondary | ICD-10-CM

## 2022-04-09 DIAGNOSIS — R970 Elevated carcinoembryonic antigen [CEA]: Secondary | ICD-10-CM | POA: Insufficient documentation

## 2022-04-09 DIAGNOSIS — C787 Secondary malignant neoplasm of liver and intrahepatic bile duct: Secondary | ICD-10-CM | POA: Diagnosis present

## 2022-04-09 DIAGNOSIS — T451X5D Adverse effect of antineoplastic and immunosuppressive drugs, subsequent encounter: Secondary | ICD-10-CM | POA: Insufficient documentation

## 2022-04-09 DIAGNOSIS — T451X5A Adverse effect of antineoplastic and immunosuppressive drugs, initial encounter: Secondary | ICD-10-CM

## 2022-04-09 DIAGNOSIS — I10 Essential (primary) hypertension: Secondary | ICD-10-CM | POA: Diagnosis not present

## 2022-04-09 DIAGNOSIS — C19 Malignant neoplasm of rectosigmoid junction: Secondary | ICD-10-CM | POA: Diagnosis present

## 2022-04-09 DIAGNOSIS — Z79899 Other long term (current) drug therapy: Secondary | ICD-10-CM | POA: Insufficient documentation

## 2022-04-09 DIAGNOSIS — C189 Malignant neoplasm of colon, unspecified: Secondary | ICD-10-CM

## 2022-04-09 DIAGNOSIS — C7800 Secondary malignant neoplasm of unspecified lung: Secondary | ICD-10-CM | POA: Insufficient documentation

## 2022-04-09 LAB — CEA (IN HOUSE-CHCC): CEA (CHCC-In House): 5.94 ng/mL — ABNORMAL HIGH (ref 0.00–5.00)

## 2022-04-09 LAB — CBC WITH DIFFERENTIAL (CANCER CENTER ONLY)
Abs Immature Granulocytes: 0 10*3/uL (ref 0.00–0.07)
Basophils Absolute: 0 10*3/uL (ref 0.0–0.1)
Basophils Relative: 1 %
Eosinophils Absolute: 0.1 10*3/uL (ref 0.0–0.5)
Eosinophils Relative: 3 %
HCT: 35.7 % — ABNORMAL LOW (ref 36.0–46.0)
Hemoglobin: 12 g/dL (ref 12.0–15.0)
Immature Granulocytes: 0 %
Lymphocytes Relative: 36 %
Lymphs Abs: 1.3 10*3/uL (ref 0.7–4.0)
MCH: 31.7 pg (ref 26.0–34.0)
MCHC: 33.6 g/dL (ref 30.0–36.0)
MCV: 94.2 fL (ref 80.0–100.0)
Monocytes Absolute: 0.4 10*3/uL (ref 0.1–1.0)
Monocytes Relative: 10 %
Neutro Abs: 1.8 10*3/uL (ref 1.7–7.7)
Neutrophils Relative %: 50 %
Platelet Count: 263 10*3/uL (ref 150–400)
RBC: 3.79 MIL/uL — ABNORMAL LOW (ref 3.87–5.11)
RDW: 14.5 % (ref 11.5–15.5)
WBC Count: 3.5 10*3/uL — ABNORMAL LOW (ref 4.0–10.5)
nRBC: 0 % (ref 0.0–0.2)

## 2022-04-09 LAB — CMP (CANCER CENTER ONLY)
ALT: 25 U/L (ref 0–44)
AST: 18 U/L (ref 15–41)
Albumin: 4 g/dL (ref 3.5–5.0)
Alkaline Phosphatase: 111 U/L (ref 38–126)
Anion gap: 5 (ref 5–15)
BUN: 13 mg/dL (ref 8–23)
CO2: 27 mmol/L (ref 22–32)
Calcium: 9.5 mg/dL (ref 8.9–10.3)
Chloride: 106 mmol/L (ref 98–111)
Creatinine: 0.58 mg/dL (ref 0.44–1.00)
GFR, Estimated: >60 mL/min (ref >60)
Glucose, Bld: 115 mg/dL — ABNORMAL HIGH (ref 70–99)
Potassium: 4.2 mmol/L (ref 3.5–5.1)
Sodium: 138 mmol/L (ref 135–145)
Total Bilirubin: 0.2 mg/dL — ABNORMAL LOW (ref 0.3–1.2)
Total Protein: 7.8 g/dL (ref 6.5–8.1)

## 2022-04-09 LAB — MAGNESIUM: Magnesium: 1.9 mg/dL (ref 1.7–2.4)

## 2022-04-09 MED ORDER — SODIUM CHLORIDE 0.9 % IV SOLN
180.0000 mg/m2 | Freq: Once | INTRAVENOUS | Status: AC
  Administered 2022-04-09: 360 mg via INTRAVENOUS
  Filled 2022-04-09: qty 3

## 2022-04-09 MED ORDER — ATROPINE SULFATE 1 MG/ML IV SOLN
0.5000 mg | Freq: Once | INTRAVENOUS | Status: AC
  Administered 2022-04-09: 0.5 mg via INTRAVENOUS
  Filled 2022-04-09: qty 1

## 2022-04-09 MED ORDER — SODIUM CHLORIDE 0.9 % IV SOLN
5000.0000 mg | INTRAVENOUS | Status: DC
  Administered 2022-04-09: 5000 mg via INTRAVENOUS
  Filled 2022-04-09: qty 100

## 2022-04-09 MED ORDER — SODIUM CHLORIDE 0.9 % IV SOLN
400.0000 mg/m2 | Freq: Once | INTRAVENOUS | Status: AC
  Administered 2022-04-09: 780 mg via INTRAVENOUS
  Filled 2022-04-09: qty 39

## 2022-04-09 MED ORDER — SODIUM CHLORIDE 0.9 % IV SOLN
10.0000 mg | Freq: Once | INTRAVENOUS | Status: AC
  Administered 2022-04-09: 10 mg via INTRAVENOUS
  Filled 2022-04-09: qty 10

## 2022-04-09 MED ORDER — SODIUM CHLORIDE 0.9 % IV SOLN: Freq: Once | INTRAVENOUS | Status: AC

## 2022-04-09 MED ORDER — PALONOSETRON HCL INJECTION 0.25 MG/5ML
0.2500 mg | Freq: Once | INTRAVENOUS | Status: AC
  Administered 2022-04-09: 0.25 mg via INTRAVENOUS
  Filled 2022-04-09: qty 5

## 2022-04-09 MED ORDER — SODIUM CHLORIDE 0.9 % IV SOLN
5.0000 mg/kg | Freq: Once | INTRAVENOUS | Status: AC
  Administered 2022-04-09: 400 mg via INTRAVENOUS
  Filled 2022-04-09: qty 16

## 2022-04-09 MED ORDER — SODIUM CHLORIDE 0.9% FLUSH
10.0000 mL | Freq: Once | INTRAVENOUS | Status: AC
  Administered 2022-04-09: 10 mL

## 2022-04-10 ENCOUNTER — Inpatient Hospital Stay: Payer: Medicare (Managed Care)

## 2022-04-10 ENCOUNTER — Ambulatory Visit: Payer: Medicare (Managed Care)

## 2022-04-10 ENCOUNTER — Inpatient Hospital Stay: Payer: Medicare (Managed Care) | Admitting: Hematology

## 2022-04-10 ENCOUNTER — Telehealth: Payer: Self-pay | Admitting: Hematology

## 2022-04-10 NOTE — Telephone Encounter (Signed)
Patient aware of all upcoming appointments 

## 2022-04-11 ENCOUNTER — Inpatient Hospital Stay: Payer: Medicare (Managed Care)

## 2022-04-11 VITALS — BP 159/97 | HR 60 | Temp 99.5°F | Resp 17

## 2022-04-11 DIAGNOSIS — Z5112 Encounter for antineoplastic immunotherapy: Secondary | ICD-10-CM | POA: Diagnosis not present

## 2022-04-11 DIAGNOSIS — C221 Intrahepatic bile duct carcinoma: Secondary | ICD-10-CM

## 2022-04-11 MED ORDER — HEPARIN SOD (PORK) LOCK FLUSH 100 UNIT/ML IV SOLN
500.0000 [IU] | Freq: Once | INTRAVENOUS | Status: AC | PRN
  Administered 2022-04-11: 500 [IU]

## 2022-04-11 MED ORDER — SODIUM CHLORIDE 0.9% FLUSH
10.0000 mL | INTRAVENOUS | Status: DC | PRN
  Administered 2022-04-11: 10 mL

## 2022-04-11 NOTE — Progress Notes (Signed)
pt arrived with port needled deaccessed & pump in a bag still running. Per pt "needle was not in her port when she woke up this morning and her clothing was wet. 50-100 ml of chemo remaining in the bag. Port site appears Clean & Dry.  Dr.Feng notified and aware.  Pt. Was accessed & properly deaccess with saline and heparin. Chemo was properly discarded. No new orders received.

## 2022-04-12 ENCOUNTER — Other Ambulatory Visit: Payer: Self-pay

## 2022-04-21 ENCOUNTER — Telehealth: Payer: Self-pay | Admitting: Hematology

## 2022-04-21 MED FILL — Dexamethasone Sodium Phosphate Inj 100 MG/10ML: INTRAMUSCULAR | Qty: 1 | Status: AC

## 2022-04-21 NOTE — Telephone Encounter (Signed)
Patient aware of appointment change

## 2022-04-21 NOTE — Assessment & Plan Note (Signed)
MSS, [name]/[name] wildtype -diagnosed 12/2019 by porta hepatis LN biopsy during EUS for work up of abdominal pain and liver lesions seen on [place] and MRI. Liver biopsy 01/08/20 confirmed metastasis from primary colorectal cancer. PET scan showed hypermetabolism to known liver mets, diffuse thoracic and abdominal lymphadenopathy, a 1.8 cm LUL pulmonary nodule, and splenic flexure of colon. -treated with first line FOLFOX 01/25/20 - 10/02/20, Vectibix added with C2. Oxali discontinued after C18 due to reaction. -switched to Xeloda 10/21/20, dose adjusted due to significant skin toxicity -due to cancer progression on recent staging scan, treatment has been changed to FOLFIRI and bevacizumab on 03/25/22, she has been tolerating well overall

## 2022-04-21 NOTE — Progress Notes (Unsigned)
[facility]   [phone] Fax:([phone]) [phone]   Clinic Follow up Note   Patient Care Team: [name], MD as PCP - General (Internal Medicine) [name], RN (Inactive) as Oncology Nurse Navigator [name], MD as Consulting Physician (Oncology) [name], MD as Consulting Physician (Gastroenterology)  Date of Service:  04/22/2022  CHIEF COMPLAINT: f/u of metastatic colon cancer      CURRENT THERAPY:  -FOLFIRI +BEVA     ASSESSMENT:  Lindsay [name] is a 67 y.o. female with   metastatic colon 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 Liver biopsy 01/08/20 confirmed metastasis from primary colorectal cancer. PET scan showed hypermetabolism to known liver mets, diffuse thoracic and abdominal lymphadenopathy, a 1.8 cm LUL pulmonary nodule, and splenic flexure of colon. -treated with first line FOLFOX 01/25/20 - 10/02/20, Vectibix added with C2. Oxali discontinued after C18 due to reaction. -switched to Xeloda 10/21/20, dose adjusted due to significant skin toxicity -due to cancer progression on recent staging scan, treatment has been changed to FOLFIRI and bevacizumab on 03/25/22, she has been tolerating well overall   Peripheral neuropathy due to chemotherapy [facility]) -started after cycle 5 FOLFOX, oxaliplatin dose reduced and eventually discontinued after reaction 09/18/20 -Persistent numbness at the feet and hands, mild overall      PLAN: -Lab was not done before OV, will schedule her before infusion  -Pt takes Potassium  2 daily -proceed with C3 Folfiri+Beva today -lab/flush, and Folfiri+Beva 05/07/2022  SUMMARY OF ONCOLOGIC HISTORY: Oncology History Overview Note  Cancer Staging No matching staging information was found for the patient.    metastatic colon cancer to liver  06/20/2019 Procedure   Colonoscopy by Dr  [name] 06/20/19  IMPRESSION -Seven 3 to 10 mm polyps in the sigmoid colon, in the transverse colon and in the escending colon, removed with a cold snare. Resected and retrireved.  -One 8m polyp in the descending colon. Biopsies. Tattoes.  -Mediaum sized lipoma in the ascending colon.   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 See Comment B. Colon, Ascending, polyp, Polypectomy:  -TUBULAR ADENOMA -No high grade dysplasia or malignancy.  C. Colon, TRansverse, Polyo, polectomy:  -TUBULAR ADENOMA -No high grade dysplasia or malignancy.  D. Colon, Sigmoid, Polyp, Polypectomy:  -HYPERPLASTIC POLYP   11/07/2019 Imaging   [place] 11/07/19  IMPRESSION: 1. Two solid masses in the liver are nonspecific. Recommend MRI abdomen with and without contrast for further evaluation.   12/15/2019 Imaging   MRI Abdomen 12/15/19  IMPRESSION: 1. There are two large masses in the liver with appearance favoring metastatic disease or hepatocellular carcinoma or cholangiocarcinoma. A benign etiology is highly unlikely given the enhancement pattern and associated adenopathy. 2. Considerable porta hepatis and retroperitoneal adenopathy. Some of the confluent porta hepatis tumor is potentially infiltrative and abuts the pancreatic body along its right upper margin, making it difficult to completely exclude the possibility of pancreatic adenocarcinoma primary. Possibilities helpful in further workup might include tissue diagnosis, endoscopic ultrasound, or nuclear medicine PET-CT. 3. Pancreas divisum. 4. Lumbar spondylosis and degenerative disc disease. 5. Despite efforts by the technologist and patient, motion artifact is present on today's exam and could not be eliminated. This reduces exam sensitivity and specificity.   12/22/2019 Procedure   Upper Endoscopy by Dr [name]10/15/21  IMPRESSION -  One lymph node was  visualized and measured in the porta hepatis region. Fine needle aspiration performed    12/22/2019 Initial Biopsy   A. LIVER, PORTA HEPATIS MASS, FINE NEEDLE 12/22/19 ASPIRATION:  FINAL MICROSCOPIC DIAGNOSIS:  - Malignant cells consistent with metastatic adenocarcinoma   01/01/2020 Initial Diagnosis   Intrahepatic cholangiocarcinoma ([place])   01/08/2020 Initial Biopsy   FINAL MICROSCOPIC DIAGNOSIS:   A. LIVER, LEFT LOBE, BIOPSY:  - Metastatic adenocarcinoma, consistent with a colorectal primary.  See  comment      COMMENT:   Immunohistochemical stains show the tumor cells are positive for CK20  and CDX2 but negative for CK7, consistent with above interpretation.  Dr. Burr Medico was notified on 01/10/2020   01/08/2020 Genetic Testing   [name]  [name] wildtype and [name]/[name] mutations which make her eligible for target biological agent Vectibix.   01/24/2020 Procedure   PAC placed 01/24/20   01/25/2020 -  Chemotherapy   first line FOLFOX starting 01/25/2020, Vextibix  added with C2 (02/06/20)   02/06/2020 - 02/06/2022 Chemotherapy   Patient is on Treatment Plan : COLORECTAL Panitumumab q14d ([place] - Type Gene Only)     06/20/2020 Imaging   CT CAP  IMPRESSION: 1. Continued interval reduction in size and conspicuity of a subsolid nodule of the peripheral left upper lobe. 2. Unchanged prominent pretracheal and subcarinal lymph nodes. 3. Redemonstrated partially calcified low-attenuation liver masses, slightly decreased in size compared to prior examination. 4. Slight interval decrease in size of a portacaval lymph node or conglomerate and retroperitoneal lymph nodes. 5. Findings are consistent with continued treatment response of nodal, pulmonary, and hepatic metastatic disease. 6. Coronary artery disease.   Aortic Atherosclerosis (ICD10-I70.0).     10/09/2020 Imaging   IMPRESSION: 1. Slight decrease in size of dominant liver mass with smaller liver mass within 1-2  mm of prior measurement. 2. Stable appearance of celiac lymph and retroperitoneal lymph nodes. Dominant node with calcification in the gastrohepatic ligament as described. 3. Continued decrease in size of LEFT upper lobe nodule. 4. Three-vessel coronary artery calcification. 5. Aortic atherosclerosis.   03/25/2022 -  Chemotherapy   Patient is on Treatment Plan : COLORECTAL FOLFIRI + Bevacizumab q14d        INTERVAL HISTORY:  Lindsay [name] is here for a follow up of  metastatic colon cancer    She was last seen by me on 04/09/2022 She presents to the clinic alone. Pt state that her port came out. Pt denies nausea and vomiting. Pt state she still has diarrhea x3 a day and she uses imodium. Pt reports the skin on her is better she states that no cracking in the skin.      All other systems were reviewed with the patient and are negative.  MEDICAL HISTORY:  Past Medical History:  Diagnosis Date   Arthritis    Diabetes mellitus without complication ([place])    Hypertension    met colon ca to liver 01/2020    SURGICAL HISTORY: Past Surgical History:  Procedure Laterality Date   ABDOMINAL HYSTERECTOMY     ANTERIOR AND POSTERIOR REPAIR WITH SACROSPINOUS FIXATION N/A 07/16/2016   Procedure: ANTERIOR AND POSTERIOR REPAIR WITH SACROSPINOUS FIXATION;  Surgeon: Everlene Farrier, MD;  Location: [facility];  Service: Gynecology;  Laterality: N/A;   CYSTOSCOPY  07/16/2016   Procedure: CYSTOSCOPY;  Surgeon: Everlene Farrier, MD;  Location: [facility];  Service: Gynecology;;   ESOPHAGOGASTRODUODENOSCOPY (EGD) WITH PROPOFOL N/A 12/22/2019   Procedure: ESOPHAGOGASTRODUODENOSCOPY (EGD) WITH PROPOFOL;  Surgeon: [name], MD;  Location: [facility];  Service: Endoscopy;  Laterality: N/A;   FINE NEEDLE ASPIRATION N/A 12/22/2019   Procedure: FINE NEEDLE ASPIRATION (FNA) LINEAR;  Surgeon: [name], MD;  Location: [facility];  Service: Endoscopy;  Laterality: N/A;   IR IMAGING GUIDED PORT INSERTION   01/24/2020   LAPAROSCOPIC VAGINAL HYSTERECTOMY WITH SALPINGECTOMY Bilateral 07/16/2016   Procedure: LAPAROSCOPIC ASSISTED VAGINAL HYSTERECTOMY WITH SALPINGECTOMY;  Surgeon: Everlene Farrier, MD;  Location: [facility];  Service: Gynecology;  Laterality: Bilateral;   MYOMECTOMY ABDOMINAL APPROACH     THYROID SURGERY     tyroid     UPPER ESOPHAGEAL ENDOSCOPIC ULTRASOUND (EUS) N/A 12/22/2019   Procedure: UPPER ESOPHAGEAL ENDOSCOPIC ULTRASOUND (EUS);  Surgeon: [name], MD;  Location: [facility];  Service: Endoscopy;  Laterality: N/A;    I have reviewed the social history and family history with the patient and they are unchanged from previous note.  ALLERGIES:  is allergic to oxaliplatin.  MEDICATIONS:  Current Outpatient Medications  Medication Sig Dispense Refill   aspirin EC 81 MG tablet Take 1 tablet (81 mg total) by mouth daily. 90 tablet 3   atorvastatin (LIPITOR) 20 MG tablet Take 20 mg by mouth at bedtime.   1   capecitabine (XELODA) 500 MG tablet TAKE 3 TABLETS BY MOUTH 2 TIMES A DAY AFTER A MEAL FOR 7 DAYS ON, FOLLOWED BY 7 DAYS OFF. REPEAT EVERY 14 DAYS. 84 tablet 1   clindamycin (CLINDAGEL) 1 % gel Apply topically 2 (two) times daily. 60 g 2   clobetasol cream (TEMOVATE) [id number] % Apply 1 application topically daily.     esomeprazole (NEXIUM) 40 MG capsule Take 40 mg by mouth daily.     hydrochlorothiazide (MICROZIDE) 12.5 MG capsule Take 12.5 mg by mouth daily.     KLOR-CON M20 20 MEQ tablet TAKE 1 TABLET TWICE A DAY 180 tablet 3   lidocaine-prilocaine (EMLA) cream Apply 1 application topically as needed. 30 g 1   magic mouthwash w/lidocaine SOLN Take 5 mLs by mouth 4 (four) times daily. 475 mL 2   magnesium oxide (MAG-OX) 400 (240 Mg) MG tablet Take 2 tablets (800 mg total) by mouth 4 (four) times daily. 240 tablet 3   MAGNESIUM-OXIDE 400 (240 Mg) MG tablet TAKE 2 TABLETS BY MOUTH THREE TIMES DAILY 180 tablet 0   omeprazole (PRILOSEC) 20 MG capsule Take 1 capsule (20 mg total) by  mouth daily. 30 capsule 2   ondansetron (ZOFRAN) 8 MG tablet TAKE 1 TABLET BY MOUTH EVERY 8 HOURS AS NEEDED FOR NAUSEA OR VOMITING 20 tablet 0   prochlorperazine (COMPAZINE) 10 MG tablet Take 1 tablet (10 mg total) by mouth every 6 (six) hours as needed for nausea or vomiting. 30 tablet 2   spironolactone (ALDACTONE) 25 MG tablet Take 25 mg by mouth daily with breakfast.     urea (CARMOL) 10 % cream Apply topically 2 (two) times daily. 71 g 2   valACYclovir (VALTREX) 1000 MG tablet Take 1 tablet (1,000 mg total) by mouth 2 (two) times daily. 10 tablet 0   verapamil (CALAN) 120 MG tablet Take 120 mg by mouth 3 (three) times daily.     No current facility-administered medications for this visit.    PHYSICAL EXAMINATION: ECOG PERFORMANCE STATUS: 1 - Symptomatic but completely ambulatory  Vitals:   04/22/22 0810  BP: 139/86  Pulse: 61  Resp: 18  Temp: 97.9 F (36.6 C)  SpO2: 97%   Wt Readings from Last 3 Encounters:  04/22/22 180 lb 8 oz (81.9 kg)  04/09/22 179 lb 8 oz (81.4 kg)  03/25/22 177 lb 12 oz (80.6 kg)    LUNGS:(-) clear to auscultation and percussion with normal breathing effort HEART: regular rate & rhythm and (+) murmurs and no lower extremity edema  LABORATORY DATA:  I have reviewed the data as listed    Latest Ref Rng & Units 04/22/2022    8:39 AM 04/09/2022    7:59 AM 03/25/2022    9:06 AM  CBC  WBC 4.0 - 10.5 K/uL 4.6  3.5  4.6   Hemoglobin 12.0 - 15.0 g/dL 12.1  12.0  11.9   Hematocrit 36.0 - 46.0 % 36.0  35.7  35.4   Platelets 150 - 400 K/uL 234  263  209         Latest Ref Rng & Units 04/09/2022    7:59 AM 03/25/2022    9:06 AM 03/20/2022    7:48 AM  CMP  Glucose 70 - 99 mg/dL 115  107  113   BUN 8 - 23 mg/dL 13  13  15   $ Creatinine 0.44 - 1.00 mg/dL 0.58  0.62  0.57   Sodium 135 - 145 mmol/L 138  136  136   Potassium 3.5 - 5.1 mmol/L 4.2  4.0  4.1   Chloride 98 - 111 mmol/L 106  104  104   CO2 22 - 32 mmol/L 27  27  27   $ Calcium 8.9 - 10.3 mg/dL 9.5   9.8  9.5   Total Protein 6.5 - 8.1 g/dL 7.8  7.6  7.6   Total Bilirubin 0.3 - 1.2 mg/dL 0.2  0.3  0.3   Alkaline Phos 38 - 126 U/L 111  108  109   AST 15 - 41 U/L 18  24  25   $ ALT 0 - 44 U/L 25  27  27       $ RADIOGRAPHIC STUDIES: I have personally reviewed the radiological images as listed and agreed with the findings in the report. No results found.    No orders of the defined types were placed in this encounter.  All questions were answered. The patient knows to call the clinic with any problems, questions or concerns. No barriers to learning was detected. The total time spent in the appointment was 30 minutes.     [name], MD 04/22/2022   Felicity Coyer, CMA, am acting as scribe for [name], MD.   I have reviewed the above documentation for accuracy and completeness, and I agree with the above.

## 2022-04-21 NOTE — Assessment & Plan Note (Signed)
-  started after cycle 5 FOLFOX, oxaliplatin dose reduced and eventually discontinued after reaction 09/18/20 -Persistent numbness at the feet and hands, mild overall

## 2022-04-22 ENCOUNTER — Inpatient Hospital Stay: Payer: Medicare (Managed Care)

## 2022-04-22 ENCOUNTER — Encounter: Payer: Self-pay | Admitting: Hematology

## 2022-04-22 ENCOUNTER — Other Ambulatory Visit: Payer: Self-pay

## 2022-04-22 ENCOUNTER — Inpatient Hospital Stay (HOSPITAL_BASED_OUTPATIENT_CLINIC_OR_DEPARTMENT_OTHER): Payer: Medicare (Managed Care) | Admitting: Hematology

## 2022-04-22 VITALS — BP 139/86 | HR 61 | Temp 97.9°F | Resp 18 | Ht 66.0 in | Wt 180.5 lb

## 2022-04-22 DIAGNOSIS — C221 Intrahepatic bile duct carcinoma: Secondary | ICD-10-CM

## 2022-04-22 DIAGNOSIS — G620 Drug-induced polyneuropathy: Secondary | ICD-10-CM

## 2022-04-22 DIAGNOSIS — T451X5A Adverse effect of antineoplastic and immunosuppressive drugs, initial encounter: Secondary | ICD-10-CM | POA: Diagnosis not present

## 2022-04-22 DIAGNOSIS — Z95828 Presence of other vascular implants and grafts: Secondary | ICD-10-CM

## 2022-04-22 DIAGNOSIS — C189 Malignant neoplasm of colon, unspecified: Secondary | ICD-10-CM

## 2022-04-22 DIAGNOSIS — Z5112 Encounter for antineoplastic immunotherapy: Secondary | ICD-10-CM | POA: Diagnosis not present

## 2022-04-22 LAB — CMP (CANCER CENTER ONLY)
ALT: 24 U/L (ref 0–44)
AST: 19 U/L (ref 15–41)
Albumin: 4.1 g/dL (ref 3.5–5.0)
Alkaline Phosphatase: 111 U/L (ref 38–126)
Anion gap: 5 (ref 5–15)
BUN: 15 mg/dL (ref 8–23)
CO2: 28 mmol/L (ref 22–32)
Calcium: 9.6 mg/dL (ref 8.9–10.3)
Chloride: 104 mmol/L (ref 98–111)
Creatinine: 0.64 mg/dL (ref 0.44–1.00)
GFR, Estimated: >60 mL/min (ref >60)
Glucose, Bld: 110 mg/dL — ABNORMAL HIGH (ref 70–99)
Potassium: 4.4 mmol/L (ref 3.5–5.1)
Sodium: 137 mmol/L (ref 135–145)
Total Bilirubin: 0.2 mg/dL — ABNORMAL LOW (ref 0.3–1.2)
Total Protein: 7.6 g/dL (ref 6.5–8.1)

## 2022-04-22 LAB — CBC WITH DIFFERENTIAL (CANCER CENTER ONLY)
Abs Immature Granulocytes: 0.01 10*3/uL (ref 0.00–0.07)
Basophils Absolute: 0 10*3/uL (ref 0.0–0.1)
Basophils Relative: 0 %
Eosinophils Absolute: 0.1 10*3/uL (ref 0.0–0.5)
Eosinophils Relative: 2 %
HCT: 36 % (ref 36.0–46.0)
Hemoglobin: 12.1 g/dL (ref 12.0–15.0)
Immature Granulocytes: 0 %
Lymphocytes Relative: 30 %
Lymphs Abs: 1.4 10*3/uL (ref 0.7–4.0)
MCH: 31.3 pg (ref 26.0–34.0)
MCHC: 33.6 g/dL (ref 30.0–36.0)
MCV: 93.3 fL (ref 80.0–100.0)
Monocytes Absolute: 0.5 10*3/uL (ref 0.1–1.0)
Monocytes Relative: 11 %
Neutro Abs: 2.6 10*3/uL (ref 1.7–7.7)
Neutrophils Relative %: 57 %
Platelet Count: 234 10*3/uL (ref 150–400)
RBC: 3.86 MIL/uL — ABNORMAL LOW (ref 3.87–5.11)
RDW: 14.5 % (ref 11.5–15.5)
WBC Count: 4.6 10*3/uL (ref 4.0–10.5)
nRBC: 0 % (ref 0.0–0.2)

## 2022-04-22 LAB — TOTAL PROTEIN, URINE DIPSTICK: Protein, ur: NEGATIVE mg/dL

## 2022-04-22 LAB — CEA (IN HOUSE-CHCC): CEA (CHCC-In House): 5.01 ng/mL — ABNORMAL HIGH (ref 0.00–5.00)

## 2022-04-22 LAB — MAGNESIUM: Magnesium: 2 mg/dL (ref 1.7–2.4)

## 2022-04-22 MED ORDER — SODIUM CHLORIDE 0.9 % IV SOLN
10.0000 mg | Freq: Once | INTRAVENOUS | Status: AC
  Administered 2022-04-22: 10 mg via INTRAVENOUS
  Filled 2022-04-22: qty 10

## 2022-04-22 MED ORDER — ATROPINE SULFATE 1 MG/ML IV SOLN
0.5000 mg | Freq: Once | INTRAVENOUS | Status: AC | PRN
  Administered 2022-04-22: 0.5 mg via INTRAVENOUS
  Filled 2022-04-22: qty 1

## 2022-04-22 MED ORDER — SODIUM CHLORIDE 0.9 % IV SOLN: Freq: Once | INTRAVENOUS | Status: AC

## 2022-04-22 MED ORDER — SODIUM CHLORIDE 0.9% FLUSH
10.0000 mL | Freq: Once | INTRAVENOUS | Status: AC
  Administered 2022-04-22: 10 mL

## 2022-04-22 MED ORDER — SODIUM CHLORIDE 0.9 % IV SOLN
2400.0000 mg/m2 | INTRAVENOUS | Status: AC
  Administered 2022-04-22: 5000 mg via INTRAVENOUS
  Filled 2022-04-22: qty 100

## 2022-04-22 MED ORDER — SODIUM CHLORIDE 0.9 % IV SOLN
5.0000 mg/kg | Freq: Once | INTRAVENOUS | Status: AC
  Administered 2022-04-22: 400 mg via INTRAVENOUS
  Filled 2022-04-22: qty 16

## 2022-04-22 MED ORDER — SODIUM CHLORIDE 0.9 % IV SOLN
400.0000 mg/m2 | Freq: Once | INTRAVENOUS | Status: AC
  Administered 2022-04-22: 780 mg via INTRAVENOUS
  Filled 2022-04-22: qty 39

## 2022-04-22 MED ORDER — SODIUM CHLORIDE 0.9 % IV SOLN
180.0000 mg/m2 | Freq: Once | INTRAVENOUS | Status: AC
  Administered 2022-04-22: 360 mg via INTRAVENOUS
  Filled 2022-04-22: qty 18

## 2022-04-22 MED ORDER — PALONOSETRON HCL INJECTION 0.25 MG/5ML
0.2500 mg | Freq: Once | INTRAVENOUS | Status: AC
  Administered 2022-04-22: 0.25 mg via INTRAVENOUS
  Filled 2022-04-22: qty 5

## 2022-04-22 NOTE — Patient Instructions (Signed)
Lindsay [name]  Discharge Instructions: Thank you for choosing [facility] to provide your oncology and hematology care.   If you have a lab appointment with the [facility], please go directly to the [facility] and check in at the registration area.   Wear comfortable clothing and clothing appropriate for easy access to any Portacath or PICC line.   We strive to give you quality time with your provider. You may need to reschedule your appointment if you arrive late (15 or more minutes).  Arriving late affects you and other patients whose appointments are after yours.  Also, if you miss three or more appointments without notifying the office, you may be dismissed from the clinic at the provider's discretion.      For prescription refill requests, have your pharmacy contact our office and allow 72 hours for refills to be completed.    Today you received the following chemotherapy and/or immunotherapy agents: Bevacizumab, Irinotecan, Leucovorin, Fluorouracil      To help prevent nausea and vomiting after your treatment, we encourage you to take your nausea medication as directed.  BELOW ARE SYMPTOMS THAT SHOULD BE REPORTED IMMEDIATELY: *FEVER GREATER THAN 100.4 F (38 C) OR HIGHER *CHILLS OR SWEATING *NAUSEA AND VOMITING THAT IS NOT CONTROLLED WITH YOUR NAUSEA MEDICATION *UNUSUAL SHORTNESS OF BREATH *UNUSUAL BRUISING OR BLEEDING *URINARY PROBLEMS (pain or burning when urinating, or frequent urination) *BOWEL PROBLEMS (unusual diarrhea, constipation, pain near the anus) TENDERNESS IN MOUTH AND THROAT WITH OR WITHOUT PRESENCE OF ULCERS (sore throat, sores in mouth, or a toothache) UNUSUAL RASH, SWELLING OR PAIN  UNUSUAL VAGINAL DISCHARGE OR ITCHING   Items with * indicate a potential emergency and should be followed up as soon as possible or go to the Emergency Department if any problems should occur.  Please show the CHEMOTHERAPY ALERT  CARD or IMMUNOTHERAPY ALERT CARD at check-in to the Emergency Department and triage nurse.  Should you have questions after your visit or need to cancel or reschedule your appointment, please contact [facility]  Dept: [phone]  and follow the prompts.  Office hours are 8:00 a.m. to 4:30 p.m. Monday - Friday. Please note that voicemails left after 4:00 p.m. may not be returned until the following business day.  We are closed weekends and major holidays. You have access to a nurse at all times for urgent questions. Please call the main number to the clinic Dept: [phone] and follow the prompts.   For any non-urgent questions, you may also contact your provider using MyChart. We now offer e-Visits for anyone 16 and older to request care online for non-urgent symptoms. For details visit [email or web address].   Also download the MyChart app! Go to the app store, search "MyChart", open the app, select [facility], and log in with your MyChart username and password.  Bevacizumab Injection What is this medication? BEVACIZUMAB (be va SIZ yoo mab) treats some types of cancer. It works by blocking a protein that causes cancer cells to grow and multiply. This helps to slow or stop the spread of cancer cells. It is a monoclonal antibody. This medicine may be used for other purposes; ask your health care provider or pharmacist if you have questions. COMMON BRAND NAME(S): Alymsys, Avastin, MVASI, Noah Charon What should I tell my care team before I take this medication? They need to know if you have any of these conditions: Blood clots Coughing up blood Having or  recent surgery Heart failure High blood pressure History of a connection between 2 or more body parts that do not usually connect (fistula) History of a tear in your stomach or intestines Protein in your urine An unusual or allergic reaction to bevacizumab, other medications, foods, dyes, or  preservatives Pregnant or trying to get pregnant Breast-feeding How should I use this medication? This medication is injected into a vein. It is given by your care team in a hospital or clinic setting. Talk to your care team the use of this medication in children. Special care may be needed. Overdosage: If you think you have taken too much of this medicine contact a poison control center or emergency room at once. NOTE: This medicine is only for you. Do not share this medicine with others. What if I miss a dose? Keep appointments for follow-up doses. It is important not to miss your dose. Call your care team if you are unable to keep an appointment. What may interact with this medication? Interactions are not expected. This list may not describe all possible interactions. Give your health care provider a list of all the medicines, herbs, non-prescription drugs, or dietary supplements you use. Also tell them if you smoke, drink alcohol, or use illegal drugs. Some items may interact with your medicine. What should I watch for while using this medication? Your condition will be monitored carefully while you are receiving this medication. You may need blood work while taking this medication. This medication may make you feel generally unwell. This is not uncommon as chemotherapy can affect healthy cells as well as cancer cells. Report any side effects. Continue your course of treatment even though you feel ill unless your care team tells you to stop. This medication may increase your risk to bruise or bleed. Call your care team if you notice any unusual bleeding. Before having surgery, talk to your care team to make sure it is ok. This medication can increase the risk of poor healing of your surgical site or wound. You will need to stop this medication for 28 days before surgery. After surgery, wait at least 28 days before restarting this medication. Make sure the surgical site or wound is healed enough  before restarting this medication. Talk to your care team if questions. Talk to your care team if you may be pregnant. Serious birth defects can occur if you take this medication during pregnancy and for 6 months after the last dose. Contraception is recommended while taking this medication and for 6 months after the last dose. Your care team can help you find the option that works for you. Do not breastfeed while taking this medication and for 6 months after the last dose. This medication can cause infertility. Talk to your care team if you are concerned about your fertility. What side effects may I notice from receiving this medication? Side effects that you should report to your care team as soon as possible: Allergic reactions--skin rash, itching, hives, swelling of the face, lips, tongue, or throat Bleeding--bloody or black, tar-like stools, vomiting blood or brown material that looks like coffee grounds, red or dark brown urine, small red or purple spots on skin, unusual bruising or bleeding Blood clot--pain, swelling, or warmth in the leg, shortness of breath, chest pain Heart attack--pain or tightness in the chest, shoulders, arms, or jaw, nausea, shortness of breath, cold or clammy skin, feeling faint or lightheaded Heart failure--shortness of breath, swelling of the ankles, feet, or hands, sudden weight gain,  unusual weakness or fatigue Increase in blood pressure Infection--fever, chills, cough, sore throat, wounds that don't heal, pain or trouble when passing urine, general feeling of discomfort or being unwell Infusion reactions--chest pain, shortness of breath or trouble breathing, feeling faint or lightheaded Kidney injury--decrease in the amount of urine, swelling of the ankles, hands, or feet Stomach pain that is severe, does not go away, or gets worse Stroke--sudden numbness or weakness of the face, arm, or leg, trouble speaking, confusion, trouble walking, loss of balance or  coordination, dizziness, severe headache, change in vision Sudden and severe headache, confusion, change in vision, seizures, which may be signs of posterior reversible encephalopathy syndrome (PRES) Side effects that usually do not require medical attention (report to your care team if they continue or are bothersome): Back pain Change in taste Diarrhea Dry skin Increased tears Nosebleed This list may not describe all possible side effects. Call your doctor for medical advice about side effects. You may report side effects to FDA at 1-800-FDA-1088. Where should I keep my medication? This medication is given in a hospital or clinic. It will not be stored at home. NOTE: This sheet is a summary. It may not cover all possible information. If you have questions about this medicine, talk to your doctor, pharmacist, or health care provider.  2023 Elsevier/Gold Standard (2021-06-27 00:00:00)  Irinotecan Injection What is this medication? IRINOTECAN (ir in oh TEE kan) treats some types of cancer. It works by slowing down the growth of cancer cells. This medicine may be used for other purposes; ask your health care provider or pharmacist if you have questions. COMMON BRAND NAME(S): Camptosar What should I tell my care team before I take this medication? They need to know if you have any of these conditions: Dehydration Diarrhea Infection, especially a viral infection, such as chickenpox, cold sores, herpes Liver disease Low blood cell levels (white cells, red cells, and platelets) Low levels of electrolytes, such as calcium, magnesium, or potassium in your blood Recent or ongoing radiation An unusual or allergic reaction to irinotecan, other medications, foods, dyes, or preservatives If you or your partner are pregnant or trying to get pregnant Breast-feeding How should I use this medication? This medication is injected into a vein. It is given by your care team in a hospital or clinic  setting. Talk to your care team about the use of this medication in children. Special care may be needed. Overdosage: If you think you have taken too much of this medicine contact a poison control center or emergency room at once. NOTE: This medicine is only for you. Do not share this medicine with others. What if I miss a dose? Keep appointments for follow-up doses. It is important not to miss your dose. Call your care team if you are unable to keep an appointment. What may interact with this medication? Do not take this medication with any of the following: Cobicistat Itraconazole This medication may also interact with the following: Certain antibiotics, such as clarithromycin, rifampin, rifabutin Certain antivirals for HIV or AIDS Certain medications for fungal infections, such as ketoconazole, posaconazole, voriconazole Certain medications for seizures, such as carbamazepine, phenobarbital, phenytoin Gemfibrozil Nefazodone St. John's wort This list may not describe all possible interactions. Give your health care provider a list of all the medicines, herbs, non-prescription drugs, or dietary supplements you use. Also tell them if you smoke, drink alcohol, or use illegal drugs. Some items may interact with your medicine. What should I watch for while using  this medication? Your condition will be monitored carefully while you are receiving this medication. You may need blood work while taking this medication. This medication may make you feel generally unwell. This is not uncommon as chemotherapy can affect healthy cells as well as cancer cells. Report any side effects. Continue your course of treatment even though you feel ill unless your care team tells you to stop. This medication can cause serious side effects. To reduce the risk, your care team may give you other medications to take before receiving this one. Be sure to follow the directions from your care team. This medication may  affect your coordination, reaction time, or judgement. Do not drive or operate machinery until you know how this medication affects you. Sit up or stand slowly to reduce the risk of dizzy or fainting spells. Drinking alcohol with this medication can increase the risk of these side effects. This medication may increase your risk of getting an infection. Call your care team for advice if you get a fever, chills, sore throat, or other symptoms of a cold or flu. Do not treat yourself. Try to avoid being around people who are sick. Avoid taking medications that contain aspirin, acetaminophen, ibuprofen, naproxen, or ketoprofen unless instructed by your care team. These medications may hide a fever. This medication may increase your risk to bruise or bleed. Call your care team if you notice any unusual bleeding. Be careful brushing or flossing your teeth or using a toothpick because you may get an infection or bleed more easily. If you have any dental work done, tell your dentist you are receiving this medication. Talk to your care team if you or your partner are pregnant or think either of you might be pregnant. This medication can cause serious birth defects if taken during pregnancy and for 6 months after the last dose. You will need a negative pregnancy test before starting this medication. Contraception is recommended while taking this medication and for 6 months after the last dose. Your care team can help you find the option that works for you. Do not father a child while taking this medication and for 3 months after the last dose. Use a condom for contraception during this time period. Do not breastfeed while taking this medication and for 7 days after the last dose. This medication may cause infertility. Talk to your care team if you are concerned about your fertility. What side effects may I notice from receiving this medication? Side effects that you should report to your care team as soon as  possible: Allergic reactions--skin rash, itching, hives, swelling of the face, lips, tongue, or throat Dry cough, shortness of breath or trouble breathing Increased saliva or tears, increased sweating, stomach cramping, diarrhea, small pupils, unusual weakness or fatigue, slow heartbeat Infection--fever, chills, cough, sore throat, wounds that don't heal, pain or trouble when passing urine, general feeling of discomfort or being unwell Kidney injury--decrease in the amount of urine, swelling of the ankles, hands, or feet Low red blood cell level--unusual weakness or fatigue, dizziness, headache, trouble breathing Severe or prolonged diarrhea Unusual bruising or bleeding Side effects that usually do not require medical attention (report to your care team if they continue or are bothersome): Constipation Diarrhea Hair loss Loss of appetite Nausea Stomach pain This list may not describe all possible side effects. Call your doctor for medical advice about side effects. You may report side effects to FDA at 1-800-FDA-1088. Where should I keep my medication? This medication is  given in a hospital or clinic. It will not be stored at home. NOTE: This sheet is a summary. It may not cover all possible information. If you have questions about this medicine, talk to your doctor, pharmacist, or health care provider.  2023 Elsevier/Gold Standard (2021-07-03 00:00:00)

## 2022-04-24 ENCOUNTER — Inpatient Hospital Stay: Payer: Medicare (Managed Care)

## 2022-04-24 VITALS — BP 128/79 | HR 60 | Temp 98.9°F | Resp 18

## 2022-04-24 DIAGNOSIS — Z95828 Presence of other vascular implants and grafts: Secondary | ICD-10-CM

## 2022-04-24 DIAGNOSIS — Z5112 Encounter for antineoplastic immunotherapy: Secondary | ICD-10-CM | POA: Diagnosis not present

## 2022-04-24 DIAGNOSIS — C221 Intrahepatic bile duct carcinoma: Secondary | ICD-10-CM

## 2022-04-24 MED ORDER — SODIUM CHLORIDE 0.9% FLUSH
10.0000 mL | Freq: Once | INTRAVENOUS | Status: AC
  Administered 2022-04-24: 10 mL

## 2022-04-24 MED ORDER — HEPARIN SOD (PORK) LOCK FLUSH 100 UNIT/ML IV SOLN
500.0000 [IU] | Freq: Once | INTRAVENOUS | Status: AC
  Administered 2022-04-24: 500 [IU]

## 2022-05-01 ENCOUNTER — Inpatient Hospital Stay: Payer: Medicare (Managed Care)

## 2022-05-01 ENCOUNTER — Ambulatory Visit: Payer: Medicare (Managed Care)

## 2022-05-01 ENCOUNTER — Inpatient Hospital Stay: Payer: Medicare (Managed Care) | Admitting: Hematology

## 2022-05-06 MED FILL — Dexamethasone Sodium Phosphate Inj 100 MG/10ML: INTRAMUSCULAR | Qty: 1 | Status: AC

## 2022-05-07 ENCOUNTER — Encounter: Payer: Self-pay | Admitting: Nurse Practitioner

## 2022-05-07 ENCOUNTER — Inpatient Hospital Stay: Payer: Medicare (Managed Care)

## 2022-05-07 ENCOUNTER — Inpatient Hospital Stay (HOSPITAL_BASED_OUTPATIENT_CLINIC_OR_DEPARTMENT_OTHER): Payer: Medicare (Managed Care) | Admitting: Nurse Practitioner

## 2022-05-07 VITALS — BP 112/68 | HR 62

## 2022-05-07 DIAGNOSIS — C221 Intrahepatic bile duct carcinoma: Secondary | ICD-10-CM

## 2022-05-07 DIAGNOSIS — Z95828 Presence of other vascular implants and grafts: Secondary | ICD-10-CM

## 2022-05-07 DIAGNOSIS — C189 Malignant neoplasm of colon, unspecified: Secondary | ICD-10-CM

## 2022-05-07 DIAGNOSIS — Z5112 Encounter for antineoplastic immunotherapy: Secondary | ICD-10-CM | POA: Diagnosis not present

## 2022-05-07 LAB — CBC WITH DIFFERENTIAL (CANCER CENTER ONLY)
Abs Immature Granulocytes: 0.01 10*3/uL (ref 0.00–0.07)
Basophils Absolute: 0 10*3/uL (ref 0.0–0.1)
Basophils Relative: 0 %
Eosinophils Absolute: 0.1 10*3/uL (ref 0.0–0.5)
Eosinophils Relative: 1 %
HCT: 34 % — ABNORMAL LOW (ref 36.0–46.0)
Hemoglobin: 11.7 g/dL — ABNORMAL LOW (ref 12.0–15.0)
Immature Granulocytes: 0 %
Lymphocytes Relative: 24 %
Lymphs Abs: 1.2 10*3/uL (ref 0.7–4.0)
MCH: 32 pg (ref 26.0–34.0)
MCHC: 34.4 g/dL (ref 30.0–36.0)
MCV: 92.9 fL (ref 80.0–100.0)
Monocytes Absolute: 0.4 10*3/uL (ref 0.1–1.0)
Monocytes Relative: 8 %
Neutro Abs: 3.5 10*3/uL (ref 1.7–7.7)
Neutrophils Relative %: 67 %
Platelet Count: 202 10*3/uL (ref 150–400)
RBC: 3.66 MIL/uL — ABNORMAL LOW (ref 3.87–5.11)
RDW: 14.6 % (ref 11.5–15.5)
WBC Count: 5.2 10*3/uL (ref 4.0–10.5)
nRBC: 0 % (ref 0.0–0.2)

## 2022-05-07 LAB — CMP (CANCER CENTER ONLY)
ALT: 88 U/L — ABNORMAL HIGH (ref 0–44)
AST: 77 U/L — ABNORMAL HIGH (ref 15–41)
Albumin: 3.9 g/dL (ref 3.5–5.0)
Alkaline Phosphatase: 87 U/L (ref 38–126)
Anion gap: 5 (ref 5–15)
BUN: 11 mg/dL (ref 8–23)
CO2: 27 mmol/L (ref 22–32)
Calcium: 8.7 mg/dL — ABNORMAL LOW (ref 8.9–10.3)
Chloride: 105 mmol/L (ref 98–111)
Creatinine: 0.6 mg/dL (ref 0.44–1.00)
GFR, Estimated: >60 mL/min (ref >60)
Glucose, Bld: 106 mg/dL — ABNORMAL HIGH (ref 70–99)
Potassium: 4.2 mmol/L (ref 3.5–5.1)
Sodium: 137 mmol/L (ref 135–145)
Total Bilirubin: 0.4 mg/dL (ref 0.3–1.2)
Total Protein: 6.8 g/dL (ref 6.5–8.1)

## 2022-05-07 LAB — CEA (IN HOUSE-CHCC): CEA (CHCC-In House): 3.48 ng/mL (ref 0.00–5.00)

## 2022-05-07 LAB — MAGNESIUM: Magnesium: 2.1 mg/dL (ref 1.7–2.4)

## 2022-05-07 MED ORDER — ATROPINE SULFATE 1 MG/ML IV SOLN
0.5000 mg | Freq: Once | INTRAVENOUS | Status: AC | PRN
  Administered 2022-05-07: 0.5 mg via INTRAVENOUS
  Filled 2022-05-07: qty 1

## 2022-05-07 MED ORDER — SODIUM CHLORIDE 0.9 % IV SOLN
180.0000 mg/m2 | Freq: Once | INTRAVENOUS | Status: AC
  Administered 2022-05-07: 360 mg via INTRAVENOUS
  Filled 2022-05-07: qty 18

## 2022-05-07 MED ORDER — SODIUM CHLORIDE 0.9 % IV SOLN
10.0000 mg | Freq: Once | INTRAVENOUS | Status: AC
  Administered 2022-05-07: 10 mg via INTRAVENOUS
  Filled 2022-05-07: qty 10

## 2022-05-07 MED ORDER — SODIUM CHLORIDE 0.9 % IV SOLN
2400.0000 mg/m2 | INTRAVENOUS | Status: DC
  Administered 2022-05-07: 5000 mg via INTRAVENOUS
  Filled 2022-05-07: qty 100

## 2022-05-07 MED ORDER — SODIUM CHLORIDE 0.9 % IV SOLN
400.0000 mg/m2 | Freq: Once | INTRAVENOUS | Status: AC
  Administered 2022-05-07: 780 mg via INTRAVENOUS
  Filled 2022-05-07: qty 39

## 2022-05-07 MED ORDER — PALONOSETRON HCL INJECTION 0.25 MG/5ML
0.2500 mg | Freq: Once | INTRAVENOUS | Status: AC
  Administered 2022-05-07: 0.25 mg via INTRAVENOUS
  Filled 2022-05-07: qty 5

## 2022-05-07 MED ORDER — SODIUM CHLORIDE 0.9 % IV SOLN: Freq: Once | INTRAVENOUS | Status: AC

## 2022-05-07 MED ORDER — SODIUM CHLORIDE 0.9% FLUSH
10.0000 mL | Freq: Once | INTRAVENOUS | Status: AC
  Administered 2022-05-07: 10 mL

## 2022-05-07 MED ORDER — SODIUM CHLORIDE 0.9 % IV SOLN
5.0000 mg/kg | Freq: Once | INTRAVENOUS | Status: AC
  Administered 2022-05-07: 400 mg via INTRAVENOUS
  Filled 2022-05-07: qty 16

## 2022-05-07 NOTE — Patient Instructions (Signed)
[facility]  Discharge Instructions: Thank you for choosing [facility] to provide your oncology and hematology care.   If you have a lab appointment with the [facility], please go directly to the [facility] and check in at the registration area.   Wear comfortable clothing and clothing appropriate for easy access to any Portacath or PICC line.   We strive to give you quality time with your provider. You may need to reschedule your appointment if you arrive late (15 or more minutes).  Arriving late affects you and other patients whose appointments are after yours.  Also, if you miss three or more appointments without notifying the office, you may be dismissed from the clinic at the provider's discretion.      For prescription refill requests, have your pharmacy contact our office and allow 72 hours for refills to be completed.    Today you received the following chemotherapy and/or immunotherapy agents: Bevacizumab, Irinotecan, Leucovorin, Fluorouracil      To help prevent nausea and vomiting after your treatment, we encourage you to take your nausea medication as directed.  BELOW ARE SYMPTOMS THAT SHOULD BE REPORTED IMMEDIATELY: *FEVER GREATER THAN 100.4 F (38 C) OR HIGHER *CHILLS OR SWEATING *NAUSEA AND VOMITING THAT IS NOT CONTROLLED WITH YOUR NAUSEA MEDICATION *UNUSUAL SHORTNESS OF BREATH *UNUSUAL BRUISING OR BLEEDING *URINARY PROBLEMS (pain or burning when urinating, or frequent urination) *BOWEL PROBLEMS (unusual diarrhea, constipation, pain near the anus) TENDERNESS IN MOUTH AND THROAT WITH OR WITHOUT PRESENCE OF ULCERS (sore throat, sores in mouth, or a toothache) UNUSUAL RASH, SWELLING OR PAIN  UNUSUAL VAGINAL DISCHARGE OR ITCHING   Items with * indicate a potential emergency and should be followed up as soon as possible or go to the Emergency Department if any problems should occur.  Please show the CHEMOTHERAPY ALERT  CARD or IMMUNOTHERAPY ALERT CARD at check-in to the Emergency Department and triage nurse.  Should you have questions after your visit or need to cancel or reschedule your appointment, please contact [facility]  Dept: [phone]  and follow the prompts.  Office hours are 8:00 a.m. to 4:30 p.m. Monday - Friday. Please note that voicemails left after 4:00 p.m. may not be returned until the following business day.  We are closed weekends and major holidays. You have access to a nurse at all times for urgent questions. Please call the main number to the clinic Dept: [phone] and follow the prompts.   For any non-urgent questions, you may also contact your provider using MyChart. We now offer e-Visits for anyone 15 and older to request care online for non-urgent symptoms. For details visit [email or web address].   Also download the MyChart app! Go to the app store, search "MyChart", open the app, select [facility], and log in with your MyChart username and password.  Bevacizumab Injection What is this medication? BEVACIZUMAB (be va SIZ yoo mab) treats some types of cancer. It works by blocking a protein that causes cancer cells to grow and multiply. This helps to slow or stop the spread of cancer cells. It is a monoclonal antibody. This medicine may be used for other purposes; ask your health care provider or pharmacist if you have questions. COMMON BRAND NAME(S): Alymsys, Avastin, MVASI, Noah Charon What should I tell my care team before I take this medication? They need to know if you have any of these conditions: Blood clots Coughing up blood Having or  recent surgery Heart failure High blood pressure History of a connection between 2 or more body parts that do not usually connect (fistula) History of a tear in your stomach or intestines Protein in your urine An unusual or allergic reaction to bevacizumab, other medications, foods, dyes, or  preservatives Pregnant or trying to get pregnant Breast-feeding How should I use this medication? This medication is injected into a vein. It is given by your care team in a hospital or clinic setting. Talk to your care team the use of this medication in children. Special care may be needed. Overdosage: If you think you have taken too much of this medicine contact a poison control center or emergency room at once. NOTE: This medicine is only for you. Do not share this medicine with others. What if I miss a dose? Keep appointments for follow-up doses. It is important not to miss your dose. Call your care team if you are unable to keep an appointment. What may interact with this medication? Interactions are not expected. This list may not describe all possible interactions. Give your health care provider a list of all the medicines, herbs, non-prescription drugs, or dietary supplements you use. Also tell them if you smoke, drink alcohol, or use illegal drugs. Some items may interact with your medicine. What should I watch for while using this medication? Your condition will be monitored carefully while you are receiving this medication. You may need blood work while taking this medication. This medication may make you feel generally unwell. This is not uncommon as chemotherapy can affect healthy cells as well as cancer cells. Report any side effects. Continue your course of treatment even though you feel ill unless your care team tells you to stop. This medication may increase your risk to bruise or bleed. Call your care team if you notice any unusual bleeding. Before having surgery, talk to your care team to make sure it is ok. This medication can increase the risk of poor healing of your surgical site or wound. You will need to stop this medication for 28 days before surgery. After surgery, wait at least 28 days before restarting this medication. Make sure the surgical site or wound is healed enough  before restarting this medication. Talk to your care team if questions. Talk to your care team if you may be pregnant. Serious birth defects can occur if you take this medication during pregnancy and for 6 months after the last dose. Contraception is recommended while taking this medication and for 6 months after the last dose. Your care team can help you find the option that works for you. Do not breastfeed while taking this medication and for 6 months after the last dose. This medication can cause infertility. Talk to your care team if you are concerned about your fertility. What side effects may I notice from receiving this medication? Side effects that you should report to your care team as soon as possible: Allergic reactions--skin rash, itching, hives, swelling of the face, lips, tongue, or throat Bleeding--bloody or black, tar-like stools, vomiting blood or brown material that looks like coffee grounds, red or dark brown urine, small red or purple spots on skin, unusual bruising or bleeding Blood clot--pain, swelling, or warmth in the leg, shortness of breath, chest pain Heart attack--pain or tightness in the chest, shoulders, arms, or jaw, nausea, shortness of breath, cold or clammy skin, feeling faint or lightheaded Heart failure--shortness of breath, swelling of the ankles, feet, or hands, sudden weight gain,  unusual weakness or fatigue Increase in blood pressure Infection--fever, chills, cough, sore throat, wounds that don't heal, pain or trouble when passing urine, general feeling of discomfort or being unwell Infusion reactions--chest pain, shortness of breath or trouble breathing, feeling faint or lightheaded Kidney injury--decrease in the amount of urine, swelling of the ankles, hands, or feet Stomach pain that is severe, does not go away, or gets worse Stroke--sudden numbness or weakness of the face, arm, or leg, trouble speaking, confusion, trouble walking, loss of balance or  coordination, dizziness, severe headache, change in vision Sudden and severe headache, confusion, change in vision, seizures, which may be signs of posterior reversible encephalopathy syndrome (PRES) Side effects that usually do not require medical attention (report to your care team if they continue or are bothersome): Back pain Change in taste Diarrhea Dry skin Increased tears Nosebleed This list may not describe all possible side effects. Call your doctor for medical advice about side effects. You may report side effects to FDA at 1-800-FDA-1088. Where should I keep my medication? This medication is given in a hospital or clinic. It will not be stored at home. NOTE: This sheet is a summary. It may not cover all possible information. If you have questions about this medicine, talk to your doctor, pharmacist, or health care provider.  2023 Elsevier/Gold Standard (2021-06-27 00:00:00)  Irinotecan Injection What is this medication? IRINOTECAN (ir in oh TEE kan) treats some types of cancer. It works by slowing down the growth of cancer cells. This medicine may be used for other purposes; ask your health care provider or pharmacist if you have questions. COMMON BRAND NAME(S): Camptosar What should I tell my care team before I take this medication? They need to know if you have any of these conditions: Dehydration Diarrhea Infection, especially a viral infection, such as chickenpox, cold sores, herpes Liver disease Low blood cell levels (white cells, red cells, and platelets) Low levels of electrolytes, such as calcium, magnesium, or potassium in your blood Recent or ongoing radiation An unusual or allergic reaction to irinotecan, other medications, foods, dyes, or preservatives If you or your partner are pregnant or trying to get pregnant Breast-feeding How should I use this medication? This medication is injected into a vein. It is given by your care team in a hospital or clinic  setting. Talk to your care team about the use of this medication in children. Special care may be needed. Overdosage: If you think you have taken too much of this medicine contact a poison control center or emergency room at once. NOTE: This medicine is only for you. Do not share this medicine with others. What if I miss a dose? Keep appointments for follow-up doses. It is important not to miss your dose. Call your care team if you are unable to keep an appointment. What may interact with this medication? Do not take this medication with any of the following: Cobicistat Itraconazole This medication may also interact with the following: Certain antibiotics, such as clarithromycin, rifampin, rifabutin Certain antivirals for HIV or AIDS Certain medications for fungal infections, such as ketoconazole, posaconazole, voriconazole Certain medications for seizures, such as carbamazepine, phenobarbital, phenytoin Gemfibrozil Nefazodone St. John's wort This list may not describe all possible interactions. Give your health care provider a list of all the medicines, herbs, non-prescription drugs, or dietary supplements you use. Also tell them if you smoke, drink alcohol, or use illegal drugs. Some items may interact with your medicine. What should I watch for while using  this medication? Your condition will be monitored carefully while you are receiving this medication. You may need blood work while taking this medication. This medication may make you feel generally unwell. This is not uncommon as chemotherapy can affect healthy cells as well as cancer cells. Report any side effects. Continue your course of treatment even though you feel ill unless your care team tells you to stop. This medication can cause serious side effects. To reduce the risk, your care team may give you other medications to take before receiving this one. Be sure to follow the directions from your care team. This medication may  affect your coordination, reaction time, or judgement. Do not drive or operate machinery until you know how this medication affects you. Sit up or stand slowly to reduce the risk of dizzy or fainting spells. Drinking alcohol with this medication can increase the risk of these side effects. This medication may increase your risk of getting an infection. Call your care team for advice if you get a fever, chills, sore throat, or other symptoms of a cold or flu. Do not treat yourself. Try to avoid being around people who are sick. Avoid taking medications that contain aspirin, acetaminophen, ibuprofen, naproxen, or ketoprofen unless instructed by your care team. These medications may hide a fever. This medication may increase your risk to bruise or bleed. Call your care team if you notice any unusual bleeding. Be careful brushing or flossing your teeth or using a toothpick because you may get an infection or bleed more easily. If you have any dental work done, tell your dentist you are receiving this medication. Talk to your care team if you or your partner are pregnant or think either of you might be pregnant. This medication can cause serious birth defects if taken during pregnancy and for 6 months after the last dose. You will need a negative pregnancy test before starting this medication. Contraception is recommended while taking this medication and for 6 months after the last dose. Your care team can help you find the option that works for you. Do not father a child while taking this medication and for 3 months after the last dose. Use a condom for contraception during this time period. Do not breastfeed while taking this medication and for 7 days after the last dose. This medication may cause infertility. Talk to your care team if you are concerned about your fertility. What side effects may I notice from receiving this medication? Side effects that you should report to your care team as soon as  possible: Allergic reactions--skin rash, itching, hives, swelling of the face, lips, tongue, or throat Dry cough, shortness of breath or trouble breathing Increased saliva or tears, increased sweating, stomach cramping, diarrhea, small pupils, unusual weakness or fatigue, slow heartbeat Infection--fever, chills, cough, sore throat, wounds that don't heal, pain or trouble when passing urine, general feeling of discomfort or being unwell Kidney injury--decrease in the amount of urine, swelling of the ankles, hands, or feet Low red blood cell level--unusual weakness or fatigue, dizziness, headache, trouble breathing Severe or prolonged diarrhea Unusual bruising or bleeding Side effects that usually do not require medical attention (report to your care team if they continue or are bothersome): Constipation Diarrhea Hair loss Loss of appetite Nausea Stomach pain This list may not describe all possible side effects. Call your doctor for medical advice about side effects. You may report side effects to FDA at 1-800-FDA-1088. Where should I keep my medication? This medication is  given in a hospital or clinic. It will not be stored at home. NOTE: This sheet is a summary. It may not cover all possible information. If you have questions about this medicine, talk to your doctor, pharmacist, or health care provider.  2023 Elsevier/Gold Standard (2021-07-03 00:00:00)

## 2022-05-07 NOTE — Progress Notes (Signed)
Patient Care Team: [name], MD as PCP - General (Internal Medicine) [name], RN (Inactive) as Oncology Nurse Navigator [name], MD as Consulting Physician (Oncology) [name], MD as Consulting Physician (Gastroenterology)   CHIEF COMPLAINT: Follow up metastatic colon cancer   Oncology History Overview Note  Cancer Staging No matching staging information was found for the patient.    metastatic colon cancer to liver  06/20/2019 Procedure   Colonoscopy by Dr [name] 06/20/19  IMPRESSION -Seven 3 to 10 mm polyps in the sigmoid colon, in the transverse colon and in the escending colon, removed with a cold snare. Resected and retrireved.  -One 35m polyp in the descending colon. Biopsies. Tattoes.  -Mediaum sized lipoma in the ascending colon.   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 See Comment B. Colon, Ascending, polyp, Polypectomy:  -TUBULAR ADENOMA -No high grade dysplasia or malignancy.  C. Colon, TRansverse, Polyo, polectomy:  -TUBULAR ADENOMA -No high grade dysplasia or malignancy.  D. Colon, Sigmoid, Polyp, Polypectomy:  -HYPERPLASTIC POLYP   11/07/2019 Imaging   [place] 11/07[place]  IMPRESSION: 1. Two solid masses in the liver are nonspecific. Recommend MRI abdomen with and without contrast for further evaluation.   12/15/2019 Imaging   MRI Abdomen 12/15/19  IMPRESSION: 1. There are two large masses in the liver with appearance favoring metastatic disease or hepatocellular carcinoma or cholangiocarcinoma. A benign etiology is highly unlikely given the enhancement pattern and associated adenopathy. 2. Considerable porta hepatis and retroperitoneal adenopathy. Some of the confluent porta hepatis tumor is potentially infiltrative and abuts the pancreatic body along its right upper margin, making it difficult to completely exclude the possibility of  pancreatic adenocarcinoma primary. Possibilities helpful in further workup might include tissue diagnosis, endoscopic ultrasound, or nuclear medicine PET-CT. 3. Pancreas divisum. 4. Lumbar spondylosis and degenerative disc disease. 5. Despite efforts by the technologist and patient, motion artifact is present on today's exam and could not be eliminated. This reduces exam sensitivity and specificity.   12/22/2019 Procedure   Upper Endoscopy by Dr [name]10/15/21  IMPRESSION - One lymph node was visualized and measured in the porta hepatis region. Fine needle aspiration performed    12/22/2019 Initial Biopsy   A. LIVER, PORTA HEPATIS MASS, FINE NEEDLE 12/22/19 ASPIRATION:  FINAL MICROSCOPIC DIAGNOSIS:  - Malignant cells consistent with metastatic adenocarcinoma   01/01/2020 Initial Diagnosis   [place]angi[place]   01/08/2020 Initial Biopsy   FINAL MICROSCOPIC DIAGNOSIS:   A. LIVER, LEFT LOBE, BIOPSY:  - Metastatic adenocarcinoma, consistent with a colorectal primary.  See  comment      COMMENT:   Immunohistochemical stains show the tumor cells are positive for CK20  and CDX2 but negative for CK7, consistent with above interpretation.  Dr. [name] notified on 01/10/2020   01/08/2020 Genetic Testing   [name]  [name] wildtype and [name]/[name] mutations which make her eligible for target biological agent Vectibix.   01/24/2020 Procedure   PAC placed 01/24/20   01/25/2020 -  Chemotherapy   first line FOLFOX starting 01/25/2020, Vextibix  added with C2 (02/06/20)   02/06/2020 - 02/06/2022 Chemotherapy   Patient is on Treatment Plan : [place]ECTAL [place]14d [place]     06/20/2020 Imaging   CT CAP  IMPRESSION: 1. Continued interval reduction in size and conspicuity of a subsolid nodule of the peripheral left upper lobe. 2. Unchanged prominent pretracheal and subcarinal lymph  nodes. 3. Redemonstrated partially calcified  low-attenuation liver masses, slightly decreased in size compared to prior examination. 4. Slight interval decrease in size of a portacaval lymph node or conglomerate and retroperitoneal lymph nodes. 5. Findings are consistent with continued treatment response of nodal, pulmonary, and hepatic metastatic disease. 6. Coronary artery disease.   Aortic Atherosclerosis (ICD10-I70.0).     10/09/2020 Imaging   IMPRESSION: 1. Slight decrease in size of dominant liver mass with smaller liver mass within 1-2 mm of prior measurement. 2. Stable appearance of celiac lymph and retroperitoneal lymph nodes. Dominant node with calcification in the gastrohepatic ligament as described. 3. Continued decrease in size of LEFT upper lobe nodule. 4. Three-vessel coronary artery calcification. 5. Aortic atherosclerosis.   03/25/2022 -  Chemotherapy   Patient is on Treatment Plan : COLORECTAL FOLFIRI + Bevacizumab q14d        CURRENT THERAPY: FOLFIRI/Beva q14 days, starting 03/25/22  INTERVAL HISTORY Lindsay [name] returns for follow up and treatment as scheduled. Last seen by Dr. Burr Medico 04/22/22 and completed cycle 3 FOLFIRI/Beva.  She is doing well in general with no significant changes.  She has up to 3 loose bowel movements per day, sometimes watery.  Only takes medication if she is going somewhere, which helps.  Has occasional stomach pain but nothing new/worse.  Denies nausea/vomiting.  She is eating and drinking well, remains active and working.  She had a "deep chill" last night before bed but woke up today feeling normal.  Denies other signs of infection such as fever, cough, congestion, dysuria, or any other new specific complaints.  ROS  All  other systems reviewed and negative  Past Medical History:  Diagnosis Date   Arthritis    Diabetes mellitus without complication ([place])    Hypertension    met colon ca to liver 01/2020     Past Surgical History:  Procedure Laterality Date   ABDOMINAL  HYSTERECTOMY     ANTERIOR AND POSTERIOR REPAIR WITH SACROSPINOUS FIXATION N/A 07/16/2016   Procedure: ANTERIOR AND POSTERIOR REPAIR WITH SACROSPINOUS FIXATION;  Surgeon: Everlene Farrier, MD;  Location: [facility];  Service: Gynecology;  Laterality: N/A;   CYSTOSCOPY  07/16/2016   Procedure: CYSTOSCOPY;  Surgeon: Everlene Farrier, MD;  Location: [facility];  Service: Gynecology;;   ESOPHAGOGASTRODUODENOSCOPY (EGD) WITH PROPOFOL N/A 12/22/2019   Procedure: ESOPHAGOGASTRODUODENOSCOPY (EGD) WITH PROPOFOL;  Surgeon: [name], MD;  Location: [facility];  Service: Endoscopy;  Laterality: N/A;   FINE NEEDLE ASPIRATION N/A 12/22/2019   Procedure: FINE NEEDLE ASPIRATION (FNA) LINEAR;  Surgeon: [name], MD;  Location: [facility];  Service: Endoscopy;  Laterality: N/A;   IR IMAGING GUIDED PORT INSERTION  01/24/2020   LAPAROSCOPIC VAGINAL HYSTERECTOMY WITH SALPINGECTOMY Bilateral 07/16/2016   Procedure: LAPAROSCOPIC ASSISTED VAGINAL HYSTERECTOMY WITH SALPINGECTOMY;  Surgeon: Everlene Farrier, MD;  Location: [facility];  Service: Gynecology;  Laterality: Bilateral;   MYOMECTOMY ABDOMINAL APPROACH     THYROID SURGERY     tyroid     UPPER ESOPHAGEAL ENDOSCOPIC ULTRASOUND (EUS) N/A 12/22/2019   Procedure: UPPER ESOPHAGEAL ENDOSCOPIC ULTRASOUND (EUS);  Surgeon: [name], MD;  Location: [facility];  Service: Endoscopy;  Laterality: N/A;     Outpatient Encounter Medications as of 05/07/2022  Medication Sig   aspirin EC 81 MG tablet Take 1 tablet (81 mg total) by mouth daily.   atorvastatin (LIPITOR) 20 MG tablet Take 20 mg by mouth at bedtime.    clindamycin (CLINDAGEL) 1 % gel Apply topically 2 (two) times daily.   clobetasol  cream (TEMOVATE) [id number] % Apply 1 application topically daily.   hydrochlorothiazide (MICROZIDE) 12.5 MG capsule Take 12.5 mg by mouth daily.   KLOR-CON M20 20 MEQ tablet TAKE 1 TABLET TWICE A DAY   lidocaine-prilocaine (EMLA) cream Apply 1 application topically as needed.   magic  mouthwash w/lidocaine SOLN Take 5 mLs by mouth 4 (four) times daily.   magnesium oxide (MAG-OX) 400 (240 Mg) MG tablet Take 2 tablets (800 mg total) by mouth 4 (four) times daily.   MAGNESIUM-OXIDE 400 (240 Mg) MG tablet TAKE 2 TABLETS BY MOUTH THREE TIMES DAILY   omeprazole (PRILOSEC) 20 MG capsule Take 1 capsule (20 mg total) by mouth daily.   ondansetron (ZOFRAN) 8 MG tablet TAKE 1 TABLET BY MOUTH EVERY 8 HOURS AS NEEDED FOR NAUSEA OR VOMITING   prochlorperazine (COMPAZINE) 10 MG tablet Take 1 tablet (10 mg total) by mouth every 6 (six) hours as needed for nausea or vomiting.   spironolactone (ALDACTONE) 25 MG tablet Take 25 mg by mouth daily with breakfast.   urea (CARMOL) 10 % cream Apply topically 2 (two) times daily.   valACYclovir (VALTREX) 1000 MG tablet Take 1 tablet (1,000 mg total) by mouth 2 (two) times daily.   verapamil (CALAN) 120 MG tablet Take 120 mg by mouth 3 (three) times daily.   [DISCONTINUED] capecitabine (XELODA) 500 MG tablet TAKE 3 TABLETS BY MOUTH 2 TIMES A DAY AFTER A MEAL FOR 7 DAYS ON, FOLLOWED BY 7 DAYS OFF. REPEAT EVERY 14 DAYS. (Patient not taking: Reported on 05/07/2022)   [DISCONTINUED] esomeprazole (NEXIUM) 40 MG capsule Take 40 mg by mouth daily. (Patient not taking: Reported on 05/07/2022)   No facility-administered encounter medications on file as of 05/07/2022.     Today's Vitals   05/07/22 0911  BP: (!) 151/70  Pulse: 69  Resp: 18  Temp: 97.9 F (36.6 C)  TempSrc: Temporal  SpO2: 100%  Weight: 181 lb 8 oz (82.3 kg)  Height: '5\' 6"'$  (1.676 m)   Body mass index is 29.29 kg/m.   PHYSICAL EXAM GENERAL:alert, no distress and comfortable SKIN: no rash  EYES: sclera clear LUNGS:  normal breathing effort NEURO: alert & oriented x 3 with fluent speech   CBC    Component Value Date/Time   WBC 5.2 05/07/2022 0847   WBC 5.3 03/20/2022 0748   RBC 3.66 (L) 05/07/2022 0847   HGB 11.7 (L) 05/07/2022 0847   HCT 34.0 (L) 05/07/2022 0847   PLT 202  05/07/2022 0847   MCV 92.9 05/07/2022 0847   MCH 32.0 05/07/2022 0847   MCHC 34.4 05/07/2022 0847   RDW 14.6 05/07/2022 0847   LYMPHSABS 1.2 05/07/2022 0847   MONOABS 0.4 05/07/2022 0847   EOSABS 0.1 05/07/2022 0847   BASOSABS 0.0 05/07/2022 0847     CMP     Component Value Date/Time   NA 137 05/07/2022 0847   K 4.2 05/07/2022 0847   CL 105 05/07/2022 0847   CO2 27 05/07/2022 0847   GLUCOSE 106 (H) 05/07/2022 0847   BUN 11 05/07/2022 0847   CREATININE 0.60 05/07/2022 0847   CALCIUM 8.7 (L) 05/07/2022 0847   PROT 6.8 05/07/2022 0847   ALBUMIN 3.9 05/07/2022 0847   AST 77 (H) 05/07/2022 0847   ALT 88 (H) 05/07/2022 0847   ALKPHOS 87 05/07/2022 0847   BILITOT 0.4 05/07/2022 0847   GFRNONAA >60 05/07/2022 0847   GFRAA >60 07/09/2016 0836     ASSESSMENT & PLAN: Lindsay [name] is a  67 y.o. female with    metastatic colon 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  -Liver biopsy 01/08/20 confirmed metastasis from primary colorectal cancer. PET scan showed hypermetabolism to known liver mets, diffuse thoracic and abdominal lymphadenopathy, a 1.8 cm LUL pulmonary nodule, and splenic flexure of colon. -treated with first line FOLFOX 01/25/20 - 10/02/20, Vectibix added with C2. Oxali discontinued after C18 due to reaction. -switched to Xeloda 10/21/20, dose adjusted due to significant skin toxicity -Last CT CAP 03/19/22 showed progression in liver and gastrohepatic ligament LN; she began FOLFIRI/beva on 03/25/22 -Lindsay [name] appears stable.  S/P cycle 3 FOLFIRI/Bev tolerating very well mild diarrhea.  Side effects are adequately managed with supportive care at home.  She is able to recover and function well.  There is no clinical evidence of disease progression.   -The CEA has decreased on this regimen, indicating she is likely responding well, today's level is  pending -Labs reviewed, CBC is stable; new mild AST/ALT elevation.  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 The patient knows to call the clinic with any problems, questions or concerns. No barriers to learning were detected.    Cira Rue, NP-C 05/07/2022

## 2022-05-07 NOTE — Progress Notes (Signed)
Per Regan Rakers, NP okay to treat with AST 77 and ALT 88

## 2022-05-08 ENCOUNTER — Encounter: Payer: Self-pay | Admitting: Hematology

## 2022-05-09 ENCOUNTER — Inpatient Hospital Stay: Payer: Medicare (Managed Care) | Attending: Hematology

## 2022-05-09 ENCOUNTER — Other Ambulatory Visit: Payer: Self-pay

## 2022-05-09 VITALS — BP 149/98 | HR 61 | Temp 98.2°F | Resp 17

## 2022-05-09 DIAGNOSIS — G620 Drug-induced polyneuropathy: Secondary | ICD-10-CM | POA: Diagnosis not present

## 2022-05-09 DIAGNOSIS — C787 Secondary malignant neoplasm of liver and intrahepatic bile duct: Secondary | ICD-10-CM | POA: Diagnosis present

## 2022-05-09 DIAGNOSIS — E119 Type 2 diabetes mellitus without complications: Secondary | ICD-10-CM | POA: Insufficient documentation

## 2022-05-09 DIAGNOSIS — Z5111 Encounter for antineoplastic chemotherapy: Secondary | ICD-10-CM | POA: Diagnosis not present

## 2022-05-09 DIAGNOSIS — Z7982 Long term (current) use of aspirin: Secondary | ICD-10-CM | POA: Diagnosis not present

## 2022-05-09 DIAGNOSIS — C19 Malignant neoplasm of rectosigmoid junction: Secondary | ICD-10-CM | POA: Diagnosis present

## 2022-05-09 DIAGNOSIS — Z79624 Long term (current) use of inhibitors of nucleotide synthesis: Secondary | ICD-10-CM | POA: Diagnosis not present

## 2022-05-09 DIAGNOSIS — Z79899 Other long term (current) drug therapy: Secondary | ICD-10-CM | POA: Insufficient documentation

## 2022-05-09 DIAGNOSIS — T451X5D Adverse effect of antineoplastic and immunosuppressive drugs, subsequent encounter: Secondary | ICD-10-CM | POA: Diagnosis not present

## 2022-05-09 DIAGNOSIS — I10 Essential (primary) hypertension: Secondary | ICD-10-CM | POA: Diagnosis not present

## 2022-05-09 DIAGNOSIS — C221 Intrahepatic bile duct carcinoma: Secondary | ICD-10-CM

## 2022-05-09 DIAGNOSIS — R970 Elevated carcinoembryonic antigen [CEA]: Secondary | ICD-10-CM | POA: Insufficient documentation

## 2022-05-09 DIAGNOSIS — Z452 Encounter for adjustment and management of vascular access device: Secondary | ICD-10-CM | POA: Diagnosis not present

## 2022-05-09 DIAGNOSIS — C7800 Secondary malignant neoplasm of unspecified lung: Secondary | ICD-10-CM | POA: Insufficient documentation

## 2022-05-09 DIAGNOSIS — Z5189 Encounter for other specified aftercare: Secondary | ICD-10-CM | POA: Diagnosis not present

## 2022-05-09 DIAGNOSIS — Z5112 Encounter for antineoplastic immunotherapy: Secondary | ICD-10-CM | POA: Diagnosis present

## 2022-05-09 MED ORDER — HEPARIN SOD (PORK) LOCK FLUSH 100 UNIT/ML IV SOLN
500.0000 [IU] | Freq: Once | INTRAVENOUS | Status: AC | PRN
  Administered 2022-05-09: 500 [IU]

## 2022-05-09 MED ORDER — SODIUM CHLORIDE 0.9% FLUSH
10.0000 mL | INTRAVENOUS | Status: DC | PRN
  Administered 2022-05-09: 10 mL

## 2022-05-20 MED FILL — Dexamethasone Sodium Phosphate Inj 100 MG/10ML: INTRAMUSCULAR | Qty: 1 | Status: AC

## 2022-05-20 NOTE — Progress Notes (Unsigned)
[place]   [phone] [phone]   Clinic Follow up Note   Patient Care Team: [name], MD as PCP - General (Internal Medicine) [name], RN (Inactive) as Oncology Nurse Navigator [name], MD as Consulting Physician (Oncology) [name], MD as Consulting Physician (Gastroenterology)  Date of Service:  05/21/2022  CHIEF COMPLAINT: f/u of metastatic colon cancer    CURRENT THERAPY: FOLFIRI/Beva q14 days, starting 03/25/22    ASSESSMENT:  Lindsay [name] is a 67 y.o. female with   metastatic colon 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 Liver biopsy 01/08/20 confirmed metastasis from primary colorectal cancer. PET scan showed hypermetabolism to known liver mets, diffuse thoracic and abdominal lymphadenopathy, a 1.8 cm LUL pulmonary nodule, and splenic flexure of colon. -treated with first line FOLFOX 01/25/20 - 10/02/20, Vectibix added with C2. Oxali discontinued after C18 due to reaction. -switched to Xeloda 10/21/20, dose adjusted due to significant skin toxicity -due to cancer progression on recent staging scan, treatment has been changed to FOLFIRI and bevacizumab on 03/25/22, she is overall tolerating well -will repeat CT in 4 weeks   Peripheral neuropathy due to chemotherapy ([place]) -started after cycle 5, oxaliplatin dose reduced and eventually discontinued after reaction 09/18/20 -Persistent numbness at the feet and hands, mild overall      PLAN: -lab reviewed, ANC 1.3 today, OK to treat -Will add a Udenyca injection to next cycle if ANC<1.3 -order CT scan TO BE DONE AROUND 4/8 -Proceed with C5 Folfiri+ Beva -lab/f/u with NP Lacie  06/03/2022 BEFORE CYCLE 6   SUMMARY OF ONCOLOGIC HISTORY: Oncology History Overview Note  Cancer Staging No matching staging information was found for the patient.     metastatic colon cancer to liver  06/20/2019 Procedure   Colonoscopy by Dr [name] 06/20/19  IMPRESSION -Seven 3 to 10 mm polyps in the sigmoid colon, in the transverse colon and in the escending colon, removed with a cold snare. Resected and retrireved.  -One 4m polyp in the descending colon. Biopsies. Tattoes.  -Mediaum sized lipoma in the ascending colon.   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 See Comment B. Colon, Ascending, polyp, Polypectomy:  -TUBULAR ADENOMA -No high grade dysplasia or malignancy.  C. Colon, TRansverse, Polyo, polectomy:  -TUBULAR ADENOMA -No high grade dysplasia or malignancy.  D. Colon, Sigmoid, Polyp, Polypectomy:  -HYPERPLASTIC POLYP   11/07/2019 Imaging   [place]dom[place] 11/07/19  IMPRESSION: 1. Two solid masses in the liver are nonspecific. Recommend MRI abdomen with and without contrast for further evaluation.   12/15/2019 Imaging   MRI Abdomen 12/15/19  IMPRESSION: 1. There are two large masses in the liver with appearance favoring metastatic disease or hepatocellular carcinoma or cholangiocarcinoma. A benign etiology is highly unlikely given the enhancement pattern and associated adenopathy. 2. Considerable porta hepatis and retroperitoneal adenopathy. Some of the confluent porta hepatis tumor is potentially infiltrative and abuts the pancreatic body along its right upper margin, making it difficult to completely exclude the possibility of pancreatic adenocarcinoma primary. Possibilities helpful in further workup might include tissue diagnosis, endoscopic ultrasound, or nuclear medicine PET-CT. 3. Pancreas divisum. 4. Lumbar spondylosis and degenerative disc disease. 5. Despite efforts by the technologist and patient, motion artifact is present on today's exam and could not be eliminated. This reduces exam sensitivity  and specificity.   12/22/2019 Procedure    Upper Endoscopy by Dr Benson [place] 12/22/19  IMPRESSION - One lymph node was visualized and measured in the porta hepatis region. Fine needle aspiration performed    12/22/2019 Initial Biopsy   A. LIVER, PORTA HEPATIS MASS, FINE NEEDLE 12/22/19 ASPIRATION:  FINAL MICROSCOPIC DIAGNOSIS:  - Malignant cells consistent with metastatic adenocarcinoma   01/01/2020 Initial Diagnosis   Intrahepatic cholangiocarcinoma ([place])   01/08/2020 Initial Biopsy   FINAL MICROSCOPIC DIAGNOSIS:   A. LIVER, LEFT LOBE, BIOPSY:  - Metastatic adenocarcinoma, consistent with a colorectal primary.  See  comment      COMMENT:   Immunohistochemical stains show the tumor cells are positive for CK20  and CDX2 but negative for CK7, consistent with above interpretation.  Dr. Burr Medico was notified on 01/10/2020   01/08/2020 Genetic Testing   [name]  [name] wildtype and [name]/[name] mutations which make her eligible for target biological agent Vectibix.   01/24/2020 Procedure   PAC placed 01/24/20   01/25/2020 -  Chemotherapy   first line FOLFOX starting 01/25/2020, Vextibix  added with C2 (02/06/20)   02/06/2020 - 02/06/2022 Chemotherapy   Patient is on Treatment Plan : COLORECTAL Panitumumab q14d ([place] - Type Gene Only)     06/20/2020 Imaging   CT CAP  IMPRESSION: 1. Continued interval reduction in size and conspicuity of a subsolid nodule of the peripheral left upper lobe. 2. Unchanged prominent pretracheal and subcarinal lymph nodes. 3. Redemonstrated partially calcified low-attenuation liver masses, slightly decreased in size compared to prior examination. 4. Slight interval decrease in size of a portacaval lymph node or conglomerate and retroperitoneal lymph nodes. 5. Findings are consistent with continued treatment response of nodal, pulmonary, and hepatic metastatic disease. 6. Coronary artery disease.   Aortic Atherosclerosis (ICD10-I70.0).     10/09/2020 Imaging   IMPRESSION: 1. Slight  decrease in size of dominant liver mass with smaller liver mass within 1-2 mm of prior measurement. 2. Stable appearance of celiac lymph and retroperitoneal lymph nodes. Dominant node with calcification in the gastrohepatic ligament as described. 3. Continued decrease in size of LEFT upper lobe nodule. 4. Three-vessel coronary artery calcification. 5. Aortic atherosclerosis.   03/25/2022 -  Chemotherapy   Patient is on Treatment Plan : COLORECTAL FOLFIRI + Bevacizumab q14d        INTERVAL HISTORY:  Lindsay [name] is here for a follow up of  metastatic colon cancer  She was last seen by NP Lacie on 05/07/2022 She presents to the clinic alone. Pt stating that she is doing well. Pt reports of having diarrhea everyday and 1 time a day. Pt denies having swelling and cough.    All other systems were reviewed with the patient and are negative.  MEDICAL HISTORY:  Past Medical History:  Diagnosis Date   Arthritis    Diabetes mellitus without complication ([place])    Hypertension    met colon ca to liver 01/2020    SURGICAL HISTORY: Past Surgical History:  Procedure Laterality Date   ABDOMINAL HYSTERECTOMY     ANTERIOR AND POSTERIOR REPAIR WITH SACROSPINOUS FIXATION N/A 07/16/2016   Procedure: ANTERIOR AND POSTERIOR REPAIR WITH SACROSPINOUS FIXATION;  Surgeon: Everlene Farrier, MD;  Location: [facility];  Service: Gynecology;  Laterality: N/A;   CYSTOSCOPY  07/16/2016   Procedure: CYSTOSCOPY;  Surgeon: Everlene Farrier, MD;  Location: [facility];  Service: Gynecology;;   ESOPHAGOGASTRODUODENOSCOPY (EGD) WITH PROPOFOL N/A 12/22/2019   Procedure: ESOPHAGOGASTRODUODENOSCOPY (EGD) WITH PROPOFOL;  Surgeon: [name],  MD;  Location: [facility];  Service: Endoscopy;  Laterality: N/A;   FINE NEEDLE ASPIRATION N/A 12/22/2019   Procedure: FINE NEEDLE ASPIRATION (FNA) LINEAR;  Surgeon: [name], MD;  Location: [facility];  Service: Endoscopy;  Laterality: N/A;   IR IMAGING GUIDED PORT INSERTION   01/24/2020   LAPAROSCOPIC VAGINAL HYSTERECTOMY WITH SALPINGECTOMY Bilateral 07/16/2016   Procedure: LAPAROSCOPIC ASSISTED VAGINAL HYSTERECTOMY WITH SALPINGECTOMY;  Surgeon: Everlene Farrier, MD;  Location: [facility];  Service: Gynecology;  Laterality: Bilateral;   MYOMECTOMY ABDOMINAL APPROACH     THYROID SURGERY     tyroid     UPPER ESOPHAGEAL ENDOSCOPIC ULTRASOUND (EUS) N/A 12/22/2019   Procedure: UPPER ESOPHAGEAL ENDOSCOPIC ULTRASOUND (EUS);  Surgeon: [name], MD;  Location: [facility];  Service: Endoscopy;  Laterality: N/A;    I have reviewed the social history and family history with the patient and they are unchanged from previous note.  ALLERGIES:  is allergic to oxaliplatin.  MEDICATIONS:  Current Outpatient Medications  Medication Sig Dispense Refill   aspirin EC 81 MG tablet Take 1 tablet (81 mg total) by mouth daily. 90 tablet 3   atorvastatin (LIPITOR) 20 MG tablet Take 20 mg by mouth at bedtime.   1   clindamycin (CLINDAGEL) 1 % gel Apply topically 2 (two) times daily. 60 g 2   clobetasol cream (TEMOVATE) [id number] % Apply 1 application topically daily.     hydrochlorothiazide (MICROZIDE) 12.5 MG capsule Take 12.5 mg by mouth daily.     KLOR-CON M20 20 MEQ tablet TAKE 1 TABLET TWICE A DAY 180 tablet 3   lidocaine-prilocaine (EMLA) cream Apply 1 application topically as needed. 30 g 1   magic mouthwash w/lidocaine SOLN Take 5 mLs by mouth 4 (four) times daily. 475 mL 2   magnesium oxide (MAG-OX) 400 (240 Mg) MG tablet Take 2 tablets (800 mg total) by mouth 4 (four) times daily. 240 tablet 3   MAGNESIUM-OXIDE 400 (240 Mg) MG tablet TAKE 2 TABLETS BY MOUTH THREE TIMES DAILY 180 tablet 0   omeprazole (PRILOSEC) 20 MG capsule Take 1 capsule (20 mg total) by mouth daily. 30 capsule 2   ondansetron (ZOFRAN) 8 MG tablet TAKE 1 TABLET BY MOUTH EVERY 8 HOURS AS NEEDED FOR NAUSEA OR VOMITING 20 tablet 0   prochlorperazine (COMPAZINE) 10 MG tablet Take 1 tablet (10 mg total) by mouth every  6 (six) hours as needed for nausea or vomiting. 30 tablet 2   spironolactone (ALDACTONE) 25 MG tablet Take 25 mg by mouth daily with breakfast.     urea (CARMOL) 10 % cream Apply topically 2 (two) times daily. 71 g 2   valACYclovir (VALTREX) 1000 MG tablet Take 1 tablet (1,000 mg total) by mouth 2 (two) times daily. 10 tablet 0   verapamil (CALAN) 120 MG tablet Take 120 mg by mouth 3 (three) times daily.     No current facility-administered medications for this visit.   Facility-Administered Medications Ordered in Other Visits  Medication Dose Route Frequency Provider Last Rate Last Admin   0.9 %  sodium chloride infusion   Intravenous Once [name], MD       bevacizumab-awwb (MVASI) 400 mg in sodium chloride 0.9 % 100 mL chemo infusion  5 mg/kg (Treatment Plan Recorded) Intravenous Once [name], MD       dexamethasone (DECADRON) 10 mg in sodium chloride 0.9 % 50 mL IVPB  10 mg Intravenous Once [name], MD       fluorouracil (ADRUCIL) 5,000 mg  in sodium chloride 0.9 % 150 mL chemo infusion  2,400 mg/m2 (Treatment Plan Recorded) Intravenous 1 day or 1 dose [name], MD       irinotecan (CAMPTOSAR) 360 mg in sodium chloride 0.9 % 500 mL chemo infusion  180 mg/m2 (Treatment Plan Recorded) Intravenous Once [name], MD       leucovorin 780 mg in sodium chloride 0.9 % 250 mL infusion  400 mg/m2 (Treatment Plan Recorded) Intravenous Once [name], MD       palonosetron (ALOXI) injection 0.25 mg  0.25 mg Intravenous Once [name], MD        PHYSICAL EXAMINATION: ECOG PERFORMANCE STATUS: 1 - Symptomatic but completely ambulatory  Vitals:   05/21/22 1010  BP: (!) 140/82  Pulse: 61  Resp: 17  Temp: 97.8 F (36.6 C)  SpO2: 100%   Wt Readings from Last 3 Encounters:  05/21/22 182 lb 4.8 oz (82.7 kg)  05/07/22 181 lb 8 oz (82.3 kg)  04/22/22 180 lb 8 oz (81.9 kg)     GENERAL:alert, no distress and comfortable SKIN: skin color normal, no rashes or significant lesions EYES: normal,  Conjunctiva are pink and non-injected, sclera clear  NEURO: alert & oriented x 3 with fluent speech  LABORATORY DATA:  I have reviewed the data as listed    Latest Ref Rng & Units 05/21/2022    9:38 AM 05/07/2022    8:47 AM 04/22/2022    8:39 AM  CBC  WBC 4.0 - 10.5 K/uL 3.0  5.2  4.6   Hemoglobin 12.0 - 15.0 g/dL 12.2  11.7  12.1   Hematocrit 36.0 - 46.0 % 36.8  34.0  36.0   Platelets 150 - 400 K/uL 226  202  234         Latest Ref Rng & Units 05/21/2022    9:38 AM 05/07/2022    8:47 AM 04/22/2022    8:39 AM  CMP  Glucose 70 - 99 mg/dL 116  106  110   BUN 8 - 23 mg/dL '15  11  15   '$ Creatinine 0.44 - 1.00 mg/dL 0.68  0.60  0.64   Sodium 135 - 145 mmol/L 139  137  137   Potassium 3.5 - 5.1 mmol/L 4.3  4.2  4.4   Chloride 98 - 111 mmol/L 105  105  104   CO2 22 - 32 mmol/L '28  27  28   '$ Calcium 8.9 - 10.3 mg/dL 9.4  8.7  9.6   Total Protein 6.5 - 8.1 g/dL 7.6  6.8  7.6   Total Bilirubin 0.3 - 1.2 mg/dL 0.2  0.4  0.2   Alkaline Phos 38 - 126 U/L 81  87  111   AST 15 - 41 U/L 16  77  19   ALT 0 - 44 U/L 19  88  24       RADIOGRAPHIC STUDIES: I have personally reviewed the radiological images as listed and agreed with the findings in the report. No results found.    Orders Placed This Encounter  Procedures   CT CHEST ABDOMEN PELVIS W CONTRAST    Standing Status:   Future    Standing Expiration Date:   05/21/2023    Order Specific Question:   Preferred imaging location?    Answer:   [facility]    Order Specific Question:   Release to patient    Answer:   Immediate    Order Specific Question:   Is  Oral Contrast requested for this exam?    Answer:   Yes, Per Radiology protocol   CBC with Differential ([place] Only)    Standing Status:   Future    Standing Expiration Date:   06/03/2023   CMP ([place] only)    Standing Status:   Future    Standing Expiration Date:   06/03/2023   Total Protein, Urine dipstick    Standing Status:   Future    Standing  Expiration Date:   06/03/2023   CBC with Differential ([place] Only)    Standing Status:   Future    Standing Expiration Date:   06/17/2023   CMP ([place] only)    Standing Status:   Future    Standing Expiration Date:   06/17/2023   Total Protein, Urine dipstick    Standing Status:   Future    Standing Expiration Date:   06/17/2023   CBC with Differential ([address] Only)    Standing Status:   Future    Standing Expiration Date:   07/01/2023   CMP ([place] only)    Standing Status:   Future    Standing Expiration Date:   07/01/2023   Total Protein, Urine dipstick    Standing Status:   Future    Standing Expiration Date:   07/01/2023   All questions were answered. The patient knows to call the clinic with any problems, questions or concerns. No barriers to learning was detected. The total time spent in the appointment was 30 minutes.     [name], MD 05/21/2022   Felicity Coyer, CMA, am acting as scribe for [name], MD.   I have reviewed the above documentation for accuracy and completeness, and I agree with the above.

## 2022-05-20 NOTE — Assessment & Plan Note (Addendum)
MSS, [name]/[name] wildtype -diagnosed 12/2019 by porta hepatis LN biopsy during EUS for work up of abdominal pain and liver lesions seen on [place] and MRI. Liver biopsy 01/08/20 confirmed metastasis from primary colorectal cancer. PET scan showed hypermetabolism to known liver mets, diffuse thoracic and abdominal lymphadenopathy, a 1.8 cm LUL pulmonary nodule, and splenic flexure of colon. -treated with first line FOLFOX 01/25/20 - 10/02/20, Vectibix added with C2. Oxali discontinued after C18 due to reaction. -switched to Xeloda 10/21/20, dose adjusted due to significant skin toxicity -due to cancer progression on recent staging scan, treatment has been changed to FOLFIRI and bevacizumab on 03/25/22, she is overall tolerating well -will repeat CT in 4 weeks

## 2022-05-20 NOTE — Assessment & Plan Note (Signed)
-  started after cycle 5, oxaliplatin dose reduced and eventually discontinued after reaction 09/18/20 -Persistent numbness at the feet and hands, mild overall    

## 2022-05-21 ENCOUNTER — Inpatient Hospital Stay (HOSPITAL_BASED_OUTPATIENT_CLINIC_OR_DEPARTMENT_OTHER): Payer: Medicare (Managed Care) | Admitting: Hematology

## 2022-05-21 ENCOUNTER — Inpatient Hospital Stay: Payer: Medicare (Managed Care)

## 2022-05-21 ENCOUNTER — Encounter: Payer: Self-pay | Admitting: Hematology

## 2022-05-21 VITALS — BP 140/82 | HR 61 | Temp 97.8°F | Resp 17 | Ht 66.0 in | Wt 182.3 lb

## 2022-05-21 DIAGNOSIS — Z95828 Presence of other vascular implants and grafts: Secondary | ICD-10-CM

## 2022-05-21 DIAGNOSIS — G620 Drug-induced polyneuropathy: Secondary | ICD-10-CM | POA: Diagnosis not present

## 2022-05-21 DIAGNOSIS — C221 Intrahepatic bile duct carcinoma: Secondary | ICD-10-CM

## 2022-05-21 DIAGNOSIS — T451X5A Adverse effect of antineoplastic and immunosuppressive drugs, initial encounter: Secondary | ICD-10-CM

## 2022-05-21 DIAGNOSIS — Z5112 Encounter for antineoplastic immunotherapy: Secondary | ICD-10-CM | POA: Diagnosis not present

## 2022-05-21 LAB — CMP (CANCER CENTER ONLY)
ALT: 19 U/L (ref 0–44)
AST: 16 U/L (ref 15–41)
Albumin: 4.1 g/dL (ref 3.5–5.0)
Alkaline Phosphatase: 81 U/L (ref 38–126)
Anion gap: 6 (ref 5–15)
BUN: 15 mg/dL (ref 8–23)
CO2: 28 mmol/L (ref 22–32)
Calcium: 9.4 mg/dL (ref 8.9–10.3)
Chloride: 105 mmol/L (ref 98–111)
Creatinine: 0.68 mg/dL (ref 0.44–1.00)
GFR, Estimated: >60 mL/min (ref >60)
Glucose, Bld: 116 mg/dL — ABNORMAL HIGH (ref 70–99)
Potassium: 4.3 mmol/L (ref 3.5–5.1)
Sodium: 139 mmol/L (ref 135–145)
Total Bilirubin: 0.2 mg/dL — ABNORMAL LOW (ref 0.3–1.2)
Total Protein: 7.6 g/dL (ref 6.5–8.1)

## 2022-05-21 LAB — CBC WITH DIFFERENTIAL (CANCER CENTER ONLY)
Abs Immature Granulocytes: 0 10*3/uL (ref 0.00–0.07)
Basophils Absolute: 0 10*3/uL (ref 0.0–0.1)
Basophils Relative: 1 %
Eosinophils Absolute: 0.1 10*3/uL (ref 0.0–0.5)
Eosinophils Relative: 4 %
HCT: 36.8 % (ref 36.0–46.0)
Hemoglobin: 12.2 g/dL (ref 12.0–15.0)
Immature Granulocytes: 0 %
Lymphocytes Relative: 38 %
Lymphs Abs: 1.1 10*3/uL (ref 0.7–4.0)
MCH: 30.7 pg (ref 26.0–34.0)
MCHC: 33.2 g/dL (ref 30.0–36.0)
MCV: 92.7 fL (ref 80.0–100.0)
Monocytes Absolute: 0.4 10*3/uL (ref 0.1–1.0)
Monocytes Relative: 13 %
Neutro Abs: 1.3 10*3/uL — ABNORMAL LOW (ref 1.7–7.7)
Neutrophils Relative %: 44 %
Platelet Count: 226 10*3/uL (ref 150–400)
RBC: 3.97 MIL/uL (ref 3.87–5.11)
RDW: 14.5 % (ref 11.5–15.5)
WBC Count: 3 10*3/uL — ABNORMAL LOW (ref 4.0–10.5)
nRBC: 0 % (ref 0.0–0.2)

## 2022-05-21 LAB — MAGNESIUM: Magnesium: 1.7 mg/dL (ref 1.7–2.4)

## 2022-05-21 MED ORDER — PALONOSETRON HCL INJECTION 0.25 MG/5ML
0.2500 mg | Freq: Once | INTRAVENOUS | Status: AC
  Administered 2022-05-21: 0.25 mg via INTRAVENOUS
  Filled 2022-05-21: qty 5

## 2022-05-21 MED ORDER — SODIUM CHLORIDE 0.9 % IV SOLN
10.0000 mg | Freq: Once | INTRAVENOUS | Status: AC
  Administered 2022-05-21: 10 mg via INTRAVENOUS
  Filled 2022-05-21: qty 10

## 2022-05-21 MED ORDER — SODIUM CHLORIDE 0.9 % IV SOLN
180.0000 mg/m2 | Freq: Once | INTRAVENOUS | Status: AC
  Administered 2022-05-21: 360 mg via INTRAVENOUS
  Filled 2022-05-21: qty 18

## 2022-05-21 MED ORDER — SODIUM CHLORIDE 0.9 % IV SOLN
2400.0000 mg/m2 | INTRAVENOUS | Status: DC
  Administered 2022-05-21: 5000 mg via INTRAVENOUS
  Filled 2022-05-21: qty 100

## 2022-05-21 MED ORDER — ATROPINE SULFATE 1 MG/ML IV SOLN
0.5000 mg | Freq: Once | INTRAVENOUS | Status: AC | PRN
  Administered 2022-05-21: 0.5 mg via INTRAVENOUS

## 2022-05-21 MED ORDER — SODIUM CHLORIDE 0.9% FLUSH: 10.0000 mL | Freq: Once | INTRAVENOUS | Status: DC

## 2022-05-21 MED ORDER — SODIUM CHLORIDE 0.9 % IV SOLN
5.0000 mg/kg | Freq: Once | INTRAVENOUS | Status: AC
  Administered 2022-05-21: 400 mg via INTRAVENOUS
  Filled 2022-05-21: qty 16

## 2022-05-21 MED ORDER — SODIUM CHLORIDE 0.9 % IV SOLN: Freq: Once | INTRAVENOUS | Status: AC

## 2022-05-21 MED ORDER — SODIUM CHLORIDE 0.9 % IV SOLN
400.0000 mg/m2 | Freq: Once | INTRAVENOUS | Status: AC
  Administered 2022-05-21: 780 mg via INTRAVENOUS
  Filled 2022-05-21: qty 39

## 2022-05-22 ENCOUNTER — Telehealth: Payer: Self-pay | Admitting: Hematology

## 2022-05-22 NOTE — Telephone Encounter (Signed)
Left patient a vm regarding all upcoming appointments  

## 2022-05-23 ENCOUNTER — Inpatient Hospital Stay: Payer: Medicare (Managed Care)

## 2022-05-23 VITALS — BP 143/91 | HR 58 | Temp 97.3°F | Resp 14

## 2022-05-23 DIAGNOSIS — Z5112 Encounter for antineoplastic immunotherapy: Secondary | ICD-10-CM | POA: Diagnosis not present

## 2022-05-23 DIAGNOSIS — C221 Intrahepatic bile duct carcinoma: Secondary | ICD-10-CM

## 2022-05-23 MED ORDER — SODIUM CHLORIDE 0.9% FLUSH
10.0000 mL | INTRAVENOUS | Status: DC | PRN
  Administered 2022-05-23: 10 mL

## 2022-05-23 MED ORDER — HEPARIN SOD (PORK) LOCK FLUSH 100 UNIT/ML IV SOLN
500.0000 [IU] | Freq: Once | INTRAVENOUS | Status: AC | PRN
  Administered 2022-05-23: 500 [IU]

## 2022-06-02 MED FILL — Dexamethasone Sodium Phosphate Inj 100 MG/10ML: INTRAMUSCULAR | Qty: 1 | Status: AC

## 2022-06-03 ENCOUNTER — Inpatient Hospital Stay (HOSPITAL_BASED_OUTPATIENT_CLINIC_OR_DEPARTMENT_OTHER): Payer: Medicare (Managed Care) | Admitting: Physician Assistant

## 2022-06-03 ENCOUNTER — Inpatient Hospital Stay: Payer: Medicare (Managed Care)

## 2022-06-03 VITALS — BP 137/78 | HR 75 | Resp 14

## 2022-06-03 VITALS — BP 133/87 | HR 59 | Temp 97.7°F | Resp 18 | Wt 183.4 lb

## 2022-06-03 DIAGNOSIS — Z5111 Encounter for antineoplastic chemotherapy: Secondary | ICD-10-CM

## 2022-06-03 DIAGNOSIS — C221 Intrahepatic bile duct carcinoma: Secondary | ICD-10-CM

## 2022-06-03 DIAGNOSIS — Z5112 Encounter for antineoplastic immunotherapy: Secondary | ICD-10-CM | POA: Diagnosis not present

## 2022-06-03 DIAGNOSIS — Z95828 Presence of other vascular implants and grafts: Secondary | ICD-10-CM

## 2022-06-03 LAB — CBC WITH DIFFERENTIAL (CANCER CENTER ONLY)
Abs Immature Granulocytes: 0.02 10*3/uL (ref 0.00–0.07)
Basophils Absolute: 0 10*3/uL (ref 0.0–0.1)
Basophils Relative: 1 %
Eosinophils Absolute: 0.1 10*3/uL (ref 0.0–0.5)
Eosinophils Relative: 2 %
HCT: 36.9 % (ref 36.0–46.0)
Hemoglobin: 12 g/dL (ref 12.0–15.0)
Immature Granulocytes: 1 %
Lymphocytes Relative: 26 %
Lymphs Abs: 1.1 10*3/uL (ref 0.7–4.0)
MCH: 30 pg (ref 26.0–34.0)
MCHC: 32.5 g/dL (ref 30.0–36.0)
MCV: 92.3 fL (ref 80.0–100.0)
Monocytes Absolute: 0.5 10*3/uL (ref 0.1–1.0)
Monocytes Relative: 13 %
Neutro Abs: 2.4 10*3/uL (ref 1.7–7.7)
Neutrophils Relative %: 57 %
Platelet Count: 224 10*3/uL (ref 150–400)
RBC: 4 MIL/uL (ref 3.87–5.11)
RDW: 15 % (ref 11.5–15.5)
WBC Count: 4.1 10*3/uL (ref 4.0–10.5)
nRBC: 0 % (ref 0.0–0.2)

## 2022-06-03 LAB — CMP (CANCER CENTER ONLY)
ALT: 28 U/L (ref 0–44)
AST: 19 U/L (ref 15–41)
Albumin: 4 g/dL (ref 3.5–5.0)
Alkaline Phosphatase: 79 U/L (ref 38–126)
Anion gap: 6 (ref 5–15)
BUN: 10 mg/dL (ref 8–23)
CO2: 28 mmol/L (ref 22–32)
Calcium: 9.5 mg/dL (ref 8.9–10.3)
Chloride: 104 mmol/L (ref 98–111)
Creatinine: 0.67 mg/dL (ref 0.44–1.00)
GFR, Estimated: >60 mL/min (ref >60)
Glucose, Bld: 123 mg/dL — ABNORMAL HIGH (ref 70–99)
Potassium: 4.3 mmol/L (ref 3.5–5.1)
Sodium: 138 mmol/L (ref 135–145)
Total Bilirubin: 0.3 mg/dL (ref 0.3–1.2)
Total Protein: 7.5 g/dL (ref 6.5–8.1)

## 2022-06-03 LAB — MAGNESIUM: Magnesium: 1.9 mg/dL (ref 1.7–2.4)

## 2022-06-03 LAB — TOTAL PROTEIN, URINE DIPSTICK: Protein, ur: NEGATIVE mg/dL

## 2022-06-03 MED ORDER — SODIUM CHLORIDE 0.9 % IV SOLN
400.0000 mg/m2 | Freq: Once | INTRAVENOUS | Status: AC
  Administered 2022-06-03: 780 mg via INTRAVENOUS
  Filled 2022-06-03: qty 39

## 2022-06-03 MED ORDER — SODIUM CHLORIDE 0.9% FLUSH: 10.0000 mL | INTRAVENOUS | Status: DC | PRN

## 2022-06-03 MED ORDER — SODIUM CHLORIDE 0.9 % IV SOLN
2400.0000 mg/m2 | INTRAVENOUS | Status: DC
  Administered 2022-06-03: 5000 mg via INTRAVENOUS
  Filled 2022-06-03: qty 100

## 2022-06-03 MED ORDER — SODIUM CHLORIDE 0.9% FLUSH
10.0000 mL | Freq: Once | INTRAVENOUS | Status: AC
  Administered 2022-06-03: 10 mL

## 2022-06-03 MED ORDER — PALONOSETRON HCL INJECTION 0.25 MG/5ML
0.2500 mg | Freq: Once | INTRAVENOUS | Status: AC
  Administered 2022-06-03: 0.25 mg via INTRAVENOUS
  Filled 2022-06-03: qty 5

## 2022-06-03 MED ORDER — SODIUM CHLORIDE 0.9 % IV SOLN
5.0000 mg/kg | Freq: Once | INTRAVENOUS | Status: AC
  Administered 2022-06-03: 400 mg via INTRAVENOUS
  Filled 2022-06-03: qty 16

## 2022-06-03 MED ORDER — HEPARIN SOD (PORK) LOCK FLUSH 100 UNIT/ML IV SOLN: 500.0000 [IU] | Freq: Once | INTRAVENOUS | Status: DC | PRN

## 2022-06-03 MED ORDER — ATROPINE SULFATE 1 MG/ML IV SOLN
0.5000 mg | Freq: Once | INTRAVENOUS | Status: AC | PRN
  Administered 2022-06-03: 0.5 mg via INTRAVENOUS
  Filled 2022-06-03: qty 1

## 2022-06-03 MED ORDER — SODIUM CHLORIDE 0.9 % IV SOLN
10.0000 mg | Freq: Once | INTRAVENOUS | Status: AC
  Administered 2022-06-03: 10 mg via INTRAVENOUS
  Filled 2022-06-03: qty 10

## 2022-06-03 MED ORDER — SODIUM CHLORIDE 0.9 % IV SOLN
180.0000 mg/m2 | Freq: Once | INTRAVENOUS | Status: AC
  Administered 2022-06-03: 360 mg via INTRAVENOUS
  Filled 2022-06-03: qty 15

## 2022-06-03 MED ORDER — SODIUM CHLORIDE 0.9 % IV SOLN: Freq: Once | INTRAVENOUS | Status: AC

## 2022-06-03 NOTE — Patient Instructions (Addendum)
[facility]  Discharge Instructions: Thank you for choosing [facility] to provide your oncology and hematology care.   If you have a lab appointment with the Cancer Center, please go directly to the Cancer Center and check in at the registration area.   Wear comfortable clothing and clothing appropriate for easy access to any Portacath or PICC line.   We strive to give you quality time with your provider. You may need to reschedule your appointment if you arrive late (15 or more minutes).  Arriving late affects you and other patients whose appointments are after yours.  Also, if you miss three or more appointments without notifying the office, you may be dismissed from the clinic at the provider's discretion.      For prescription refill requests, have your pharmacy contact our office and allow 72 hours for refills to be completed.    Today you received the following chemotherapy and/or immunotherapy agents : Bevacizumab, Irinotecan, Leucovorin, 5FU      To help prevent nausea and vomiting after your treatment, we encourage you to take your nausea medication as directed.  BELOW ARE SYMPTOMS THAT SHOULD BE REPORTED IMMEDIATELY: *FEVER GREATER THAN 100.4 F (38 C) OR HIGHER *CHILLS OR SWEATING *NAUSEA AND VOMITING THAT IS NOT CONTROLLED WITH YOUR NAUSEA MEDICATION *UNUSUAL SHORTNESS OF BREATH *UNUSUAL BRUISING OR BLEEDING *URINARY PROBLEMS (pain or burning when urinating, or frequent urination) *BOWEL PROBLEMS (unusual diarrhea, constipation, pain near the anus) TENDERNESS IN MOUTH AND THROAT WITH OR WITHOUT PRESENCE OF ULCERS (sore throat, sores in mouth, or a toothache) UNUSUAL RASH, SWELLING OR PAIN  UNUSUAL VAGINAL DISCHARGE OR ITCHING   Items with * indicate a potential emergency and should be followed up as soon as possible or go to the Emergency Department if any problems should occur.  Please show the CHEMOTHERAPY ALERT CARD or  IMMUNOTHERAPY ALERT CARD at check-in to the Emergency Department and triage nurse.  Should you have questions after your visit or need to cancel or reschedule your appointment, please contact [facility]  Dept: [phone]  and follow the prompts.  Office hours are 8:00 a.m. to 4:30 p.m. Monday - Friday. Please note that voicemails left after 4:00 p.m. may not be returned until the following business day.  We are closed weekends and major holidays. You have access to a nurse at all times for urgent questions. Please call the main number to the clinic Dept: [phone] and follow the prompts.   For any non-urgent questions, you may also contact your provider using MyChart. We now offer e-Visits for anyone 18 and older to request care online for non-urgent symptoms. For details visit [email or web address].   Also download the MyChart app! Go to the app store, search "MyChart", open the app, select , and log in with your MyChart username and password.  

## 2022-06-03 NOTE — Progress Notes (Signed)
[place]   [phone] [phone]   Clinic Follow up Note   Patient Care Team: [name], MD as PCP - General (Internal Medicine) [name], RN (Inactive) as Oncology Nurse Navigator [name], MD as Consulting Physician (Oncology) [name], MD as Consulting Physician (Gastroenterology)  Date of Service:  06/03/2022  CHIEF COMPLAINT: f/u of metastatic colon cancer    CURRENT THERAPY: FOLFIRI/Beva q14 days, started 03/25/22   SUMMARY OF ONCOLOGIC HISTORY: Oncology History Overview Note  Cancer Staging No matching staging information was found for the patient.    metastatic colon cancer to liver  06/20/2019 Procedure   Colonoscopy by Dr [name] 06/20/19  IMPRESSION -Seven 3 to 10 mm polyps in the sigmoid colon, in the transverse colon and in the escending colon, removed with a cold snare. Resected and retrireved.  -One 73mm polyp in the descending colon. Biopsies. Tattoes.  -Mediaum sized lipoma in the ascending colon.   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 See Comment B. Colon, Ascending, polyp, Polypectomy:  -TUBULAR ADENOMA -No high grade dysplasia or malignancy.  C. Colon, TRansverse, Polyo, polectomy:  -TUBULAR ADENOMA -No high grade dysplasia or malignancy.  D. Colon, Sigmoid, Polyp, Polypectomy:  -HYPERPLASTIC POLYP   11/07/2019 Imaging   US Abdomen 11/07/19  IMPRESSION: 1. Two solid masses in the liver are nonspecific. Recommend MRI abdomen with and without contrast for further evaluation.   12/15/2019 Imaging   MRI Abdomen 12/15/19  IMPRESSION: 1. There are two large masses in the liver with appearance favoring metastatic disease or hepatocellular carcinoma or cholangiocarcinoma. A benign etiology is highly unlikely given the enhancement pattern and associated adenopathy. 2. Considerable porta hepatis and  retroperitoneal adenopathy. Some of the confluent porta hepatis tumor is potentially infiltrative and abuts the pancreatic body along its right upper margin, making it difficult to completely exclude the possibility of pancreatic adenocarcinoma primary. Possibilities helpful in further workup might include tissue diagnosis, endoscopic ultrasound, or nuclear medicine PET-CT. 3. Pancreas divisum. 4. Lumbar spondylosis and degenerative disc disease. 5. Despite efforts by the technologist and patient, motion artifact is present on today's exam and could not be eliminated. This reduces exam sensitivity and specificity.   12/22/2019 Procedure   Upper Endoscopy by Dr Benson Norway 12/22/19  IMPRESSION - One lymph node was visualized and measured in the porta hepatis region. Fine needle aspiration performed    12/22/2019 Initial Biopsy   A. LIVER, PORTA HEPATIS MASS, FINE NEEDLE 12/22/19 ASPIRATION:  FINAL MICROSCOPIC DIAGNOSIS:  - Malignant cells consistent with metastatic adenocarcinoma   01/01/2020 Initial Diagnosis   Intrahepatic cholangiocarcinoma ([place])   01/08/2020 Initial Biopsy   FINAL MICROSCOPIC DIAGNOSIS:   A. LIVER, LEFT LOBE, BIOPSY:  - Metastatic adenocarcinoma, consistent with a colorectal primary.  See  comment      COMMENT:   Immunohistochemical stains show the tumor cells are positive for CK20  and CDX2 but negative for CK7, consistent with above interpretation.  Dr. Burr Medico was notified on 01/10/2020   01/08/2020 Genetic Testing   [name]  [name] wildtype and [name]/[name] mutations which make her eligible for target biological agent Vectibix.   01/24/2020 Procedure   PAC placed 01/24/20   01/25/2020 -  Chemotherapy   first line FOLFOX starting 01/25/2020, Vextibix  added with C2 (02/06/20)   02/06/2020 - 02/06/2022 Chemotherapy   Patient is on Treatment Plan : COLORECTAL Panitumumab q14d ([place] - Type Gene Only)  06/20/2020 Imaging   CT CAP   IMPRESSION: 1. Continued interval reduction in size and conspicuity of a subsolid nodule of the peripheral left upper lobe. 2. Unchanged prominent pretracheal and subcarinal lymph nodes. 3. Redemonstrated partially calcified low-attenuation liver masses, slightly decreased in size compared to prior examination. 4. Slight interval decrease in size of a portacaval lymph node or conglomerate and retroperitoneal lymph nodes. 5. Findings are consistent with continued treatment response of nodal, pulmonary, and hepatic metastatic disease. 6. Coronary artery disease.   Aortic Atherosclerosis (ICD10-I70.0).     10/09/2020 Imaging   IMPRESSION: 1. Slight decrease in size of dominant liver mass with smaller liver mass within 1-2 mm of prior measurement. 2. Stable appearance of celiac lymph and retroperitoneal lymph nodes. Dominant node with calcification in the gastrohepatic ligament as described. 3. Continued decrease in size of LEFT upper lobe nodule. 4. Three-vessel coronary artery calcification. 5. Aortic atherosclerosis.   03/25/2022 -  Chemotherapy   Patient is on Treatment Plan : COLORECTAL FOLFIRI + Bevacizumab q14d        INTERVAL HISTORY:  Lindsay [name] is here for a follow up of  metastatic colon cancer  She was last seen by Dr. Burr Medico on 05/21/2022 She presents to the clinic alone.   [name] reports that she is tolerating chemotherapy without significant limitations. Her energy and appetite are stable. Her weight is unchanged. She experienced one episode of nausea and vomiting last week that resolved after taking antiemetics. She denies abdominal pain. She does have intermittent episodes of diarrhea with up to 2-3 episodes per day. She takes imodium as needed with relief of symptoms. She denies easy bruising or signs of active bleeding. Her neuropathy is stable and unchanged from the last visit. She denies fevers, chills, sweats, shortness of breath, chest pain or cough. She has  no other complaints.    All other systems were reviewed with the patient and are negative.  MEDICAL HISTORY:  Past Medical History:  Diagnosis Date   Arthritis    Diabetes mellitus without complication ([place])    Hypertension    met colon ca to liver 01/2020    SURGICAL HISTORY: Past Surgical History:  Procedure Laterality Date   ABDOMINAL HYSTERECTOMY     ANTERIOR AND POSTERIOR REPAIR WITH SACROSPINOUS FIXATION N/A 07/16/2016   Procedure: ANTERIOR AND POSTERIOR REPAIR WITH SACROSPINOUS FIXATION;  Surgeon: Everlene Farrier, MD;  Location: [facility];  Service: Gynecology;  Laterality: N/A;   CYSTOSCOPY  07/16/2016   Procedure: CYSTOSCOPY;  Surgeon: Everlene Farrier, MD;  Location: [facility];  Service: Gynecology;;   ESOPHAGOGASTRODUODENOSCOPY (EGD) WITH PROPOFOL N/A 12/22/2019   Procedure: ESOPHAGOGASTRODUODENOSCOPY (EGD) WITH PROPOFOL;  Surgeon: [name], MD;  Location: [facility];  Service: Endoscopy;  Laterality: N/A;   FINE NEEDLE ASPIRATION N/A 12/22/2019   Procedure: FINE NEEDLE ASPIRATION (FNA) LINEAR;  Surgeon: [name], MD;  Location: [facility];  Service: Endoscopy;  Laterality: N/A;   IR IMAGING GUIDED PORT INSERTION  01/24/2020   LAPAROSCOPIC VAGINAL HYSTERECTOMY WITH SALPINGECTOMY Bilateral 07/16/2016   Procedure: LAPAROSCOPIC ASSISTED VAGINAL HYSTERECTOMY WITH SALPINGECTOMY;  Surgeon: Everlene Farrier, MD;  Location: [facility];  Service: Gynecology;  Laterality: Bilateral;   MYOMECTOMY ABDOMINAL APPROACH     THYROID SURGERY     tyroid     UPPER ESOPHAGEAL ENDOSCOPIC ULTRASOUND (EUS) N/A 12/22/2019   Procedure: UPPER ESOPHAGEAL ENDOSCOPIC ULTRASOUND (EUS);  Surgeon: [name], MD;  Location: [facility];  Service: Endoscopy;  Laterality: N/A;    I have reviewed the  social history and family history with the patient and they are unchanged from previous note.  ALLERGIES:  is allergic to oxaliplatin.  MEDICATIONS:  Current Outpatient Medications  Medication Sig  Dispense Refill   aspirin EC 81 MG tablet Take 1 tablet (81 mg total) by mouth daily. 90 tablet 3   atorvastatin (LIPITOR) 20 MG tablet Take 20 mg by mouth at bedtime.   1   clindamycin (CLINDAGEL) 1 % gel Apply topically 2 (two) times daily. 60 g 2   clobetasol cream (TEMOVATE) [id number] % Apply 1 application topically daily.     hydrochlorothiazide (MICROZIDE) 12.5 MG capsule Take 12.5 mg by mouth daily.     KLOR-CON M20 20 MEQ tablet TAKE 1 TABLET TWICE A DAY 180 tablet 3   lidocaine-prilocaine (EMLA) cream Apply 1 application topically as needed. 30 g 1   magic mouthwash w/lidocaine SOLN Take 5 mLs by mouth 4 (four) times daily. 475 mL 2   magnesium oxide (MAG-OX) 400 (240 Mg) MG tablet Take 2 tablets (800 mg total) by mouth 4 (four) times daily. 240 tablet 3   MAGNESIUM-OXIDE 400 (240 Mg) MG tablet TAKE 2 TABLETS BY MOUTH THREE TIMES DAILY 180 tablet 0   ondansetron (ZOFRAN) 8 MG tablet TAKE 1 TABLET BY MOUTH EVERY 8 HOURS AS NEEDED FOR NAUSEA OR VOMITING 20 tablet 0   prochlorperazine (COMPAZINE) 10 MG tablet Take 1 tablet (10 mg total) by mouth every 6 (six) hours as needed for nausea or vomiting. 30 tablet 2   spironolactone (ALDACTONE) 25 MG tablet Take 25 mg by mouth daily with breakfast.     urea (CARMOL) 10 % cream Apply topically 2 (two) times daily. 71 g 2   valACYclovir (VALTREX) 1000 MG tablet Take 1 tablet (1,000 mg total) by mouth 2 (two) times daily. 10 tablet 0   verapamil (CALAN) 120 MG tablet Take 120 mg by mouth 3 (three) times daily.     omeprazole (PRILOSEC) 20 MG capsule Take 1 capsule (20 mg total) by mouth daily. (Patient not taking: Reported on 06/03/2022) 30 capsule 2   No current facility-administered medications for this visit.   Facility-Administered Medications Ordered in Other Visits  Medication Dose Route Frequency Provider Last Rate Last Admin   atropine injection 0.5 mg  0.5 mg Intravenous Once PRN [name], MD       bevacizumab-awwb (MVASI) 400 mg in sodium  chloride 0.9 % 100 mL chemo infusion  5 mg/kg (Treatment Plan Recorded) Intravenous Once [name], MD       dexamethasone (DECADRON) 10 mg in sodium chloride 0.9 % 50 mL IVPB  10 mg Intravenous Once [name], MD 204 mL/hr at 06/03/22 1206 10 mg at 06/03/22 1206   fluorouracil (ADRUCIL) 5,000 mg in sodium chloride 0.9 % 150 mL chemo infusion  2,400 mg/m2 (Treatment Plan Recorded) Intravenous 1 day or 1 dose [name], MD       heparin lock flush 100 unit/mL  500 Units Intracatheter Once PRN [name], MD       irinotecan (CAMPTOSAR) 360 mg in sodium chloride 0.9 % 500 mL chemo infusion  180 mg/m2 (Treatment Plan Recorded) Intravenous Once [name], MD       leucovorin 780 mg in sodium chloride 0.9 % 250 mL infusion  400 mg/m2 (Treatment Plan Recorded) Intravenous Once [name], MD       sodium chloride flush (NS) 0.9 % injection 10 mL  10 mL Intracatheter PRN [name], MD  PHYSICAL EXAMINATION: ECOG PERFORMANCE STATUS: 1 - Symptomatic but completely ambulatory  Vitals:   06/03/22 1130  BP: 133/87  Pulse: (!) 59  Resp: 18  Temp: 97.7 F (36.5 C)  SpO2: 100%    Wt Readings from Last 3 Encounters:  06/03/22 183 lb 6.4 oz (83.2 kg)  05/21/22 182 lb 4.8 oz (82.7 kg)  05/07/22 181 lb 8 oz (82.3 kg)    Constitutional: Oriented to person, place, and time and well-developed, well-nourished, and in no distress.  HENT:  Head: Normocephalic and atraumatic.  Eyes: Conjunctivae are normal. Right eye exhibits no discharge. Left eye exhibits no discharge. No scleral icterus.  Cardiovascular: Normal rate, regular rhythm, normal heart sounds and intact distal pulses.   Pulmonary/Chest: Effort normal and breath sounds normal. No respiratory distress. No wheezes. No rales.  Musculoskeletal: Normal range of motion. Exhibits no edema.  Lymphadenopathy: No cervical adenopathy.  Neurological: Alert and oriented to person, place, and time. Exhibits normal muscle tone. Gait normal. Coordination  normal.  Skin: Skin is warm and dry. No rash noted. Not diaphoretic. No erythema. No pallor.  Psychiatric: Mood, memory and judgment normal.    LABORATORY DATA:  I have reviewed the data as listed    Latest Ref Rng & Units 06/03/2022   11:08 AM 05/21/2022    9:38 AM 05/07/2022    8:47 AM  CBC  WBC 4.0 - 10.5 K/uL 4.1  3.0  5.2   Hemoglobin 12.0 - 15.0 g/dL 12.0  12.2  11.7   Hematocrit 36.0 - 46.0 % 36.9  36.8  34.0   Platelets 150 - 400 K/uL 224  226  202         Latest Ref Rng & Units 06/03/2022   11:08 AM 05/21/2022    9:38 AM 05/07/2022    8:47 AM  CMP  Glucose 70 - 99 mg/dL 123  116  106   BUN 8 - 23 mg/dL 10  15  11    Creatinine 0.44 - 1.00 mg/dL 0.67  0.68  0.60   Sodium 135 - 145 mmol/L 138  139  137   Potassium 3.5 - 5.1 mmol/L 4.3  4.3  4.2   Chloride 98 - 111 mmol/L 104  105  105   CO2 22 - 32 mmol/L 28  28  27    Calcium 8.9 - 10.3 mg/dL 9.5  9.4  8.7   Total Protein 6.5 - 8.1 g/dL 7.5  7.6  6.8   Total Bilirubin 0.3 - 1.2 mg/dL 0.3  0.2  0.4   Alkaline Phos 38 - 126 U/L 79  81  87   AST 15 - 41 U/L 19  16  77   ALT 0 - 44 U/L 28  19  88       RADIOGRAPHIC STUDIES: I have personally reviewed the radiological images as listed and agreed with the findings in the report. No results found.   ASSESSMENT:  Lindsay [name] is a 67 y.o. female with metastatic colon cancer who presents for a follow up.    #Metastatic colon 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 Liver biopsy 01/08/20 confirmed metastasis from primary colorectal cancer. PET scan showed hypermetabolism to known liver mets, diffuse thoracic and abdominal lymphadenopathy, a 1.8 cm LUL pulmonary nodule, and splenic flexure of colon. -treated with first line FOLFOX 01/25/20 - 10/02/20, Vectibix added  with C2. Oxali discontinued after C18 due to reaction. -switched to Xeloda 10/21/20,  dose adjusted due to significant skin toxicity -due to cancer progression on recent staging scan, treatment has been changed to FOLFIRI and bevacizumab on 03/25/22   #Peripheral neuropathy due to chemotherapy [facility]) -started after cycle 5, oxaliplatin dose reduced and eventually discontinued after reaction 09/18/20 -Persistent numbness at the feet and hands,   #Diarrhea: -Likely secondary to irinotecan -Takes imodium as needed with relief.     PLAN: -Due for Cycle 6, Day 1 of FOLFIRI plus bevacizumab today -Labs from today were reviewed and adequate for treatment. No cytopenias. Normal creatinine and LFTs.  -Proceed with treatment today without any dose modifications -Scheduled for restaging CT CAP on 06/16/2022.  -RTC in 2 weeks with labs, follow up visit with Cira Rue NP before cycle 7  No orders of the defined types were placed in this encounter.  All questions were answered. The patient knows to call the clinic with any problems, questions or concerns. No barriers to learning was detected.  I have spent a total of 30 minutes minutes of face-to-face and non-face-to-face time, preparing to see the patient,  performing a medically appropriate examination, counseling and educating the patient, documenting clinical information in the electronic health record, and care coordination.   Dede Query PA-C Dept of Hematology and [place] [facility] Phone: [phone]

## 2022-06-05 ENCOUNTER — Other Ambulatory Visit: Payer: Self-pay

## 2022-06-05 ENCOUNTER — Inpatient Hospital Stay: Payer: Medicare (Managed Care)

## 2022-06-05 VITALS — BP 123/73 | HR 61 | Temp 98.4°F | Resp 16

## 2022-06-05 DIAGNOSIS — Z5112 Encounter for antineoplastic immunotherapy: Secondary | ICD-10-CM | POA: Diagnosis not present

## 2022-06-05 DIAGNOSIS — C221 Intrahepatic bile duct carcinoma: Secondary | ICD-10-CM

## 2022-06-05 MED ORDER — PEGFILGRASTIM-CBQV 6 MG/0.6ML ~~LOC~~ SOSY
6.0000 mg | PREFILLED_SYRINGE | Freq: Once | SUBCUTANEOUS | Status: AC
  Administered 2022-06-05: 6 mg via SUBCUTANEOUS
  Filled 2022-06-05: qty 0.6

## 2022-06-05 MED ORDER — HEPARIN SOD (PORK) LOCK FLUSH 100 UNIT/ML IV SOLN
500.0000 [IU] | Freq: Once | INTRAVENOUS | Status: AC | PRN
  Administered 2022-06-05: 500 [IU]

## 2022-06-05 MED ORDER — SODIUM CHLORIDE 0.9% FLUSH
10.0000 mL | INTRAVENOUS | Status: DC | PRN
  Administered 2022-06-05: 10 mL

## 2022-06-05 NOTE — Patient Instructions (Signed)

## 2022-06-06 IMAGING — US US ABDOMEN COMPLETE
1 series · 13 of 25 positions shown · non-contrast
Comparison: None.

CLINICAL DATA: Epigastric abdominal pain, increased alkaline
phosphatase.

EXAM:
ABDOMEN ULTRASOUND COMPLETE

[Series 1: us abdomen complete · 0.25mm/px · 13 of 109 slices shown]
[im 1/109]
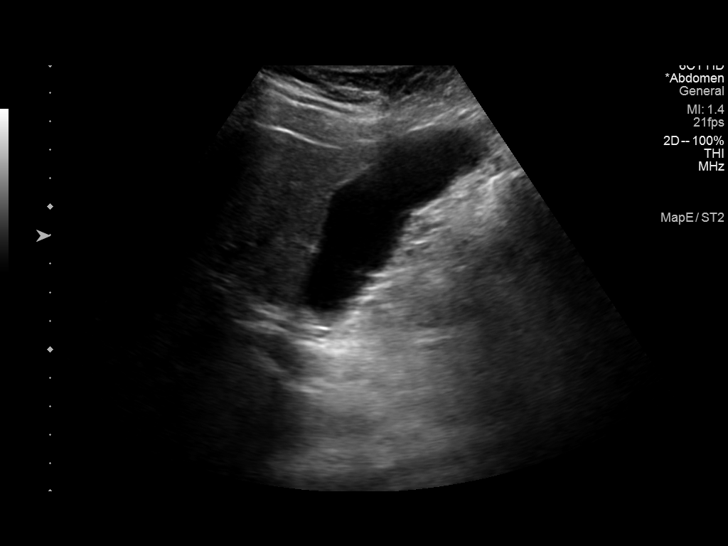
[im 10/109]
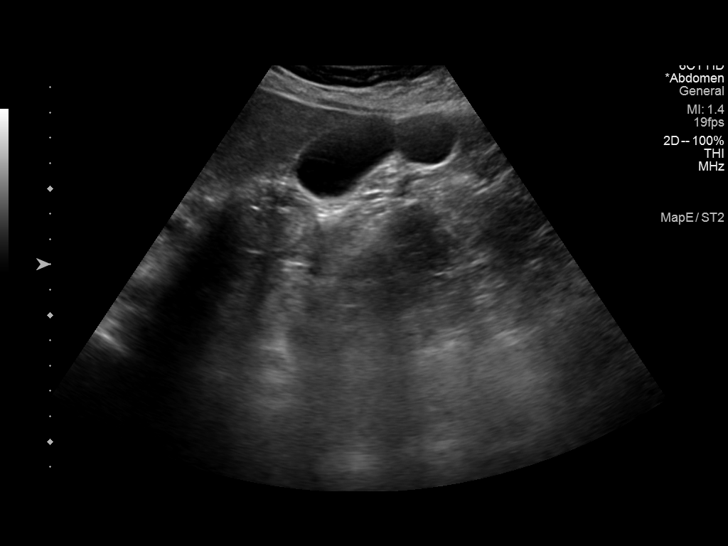
[im 19/109]
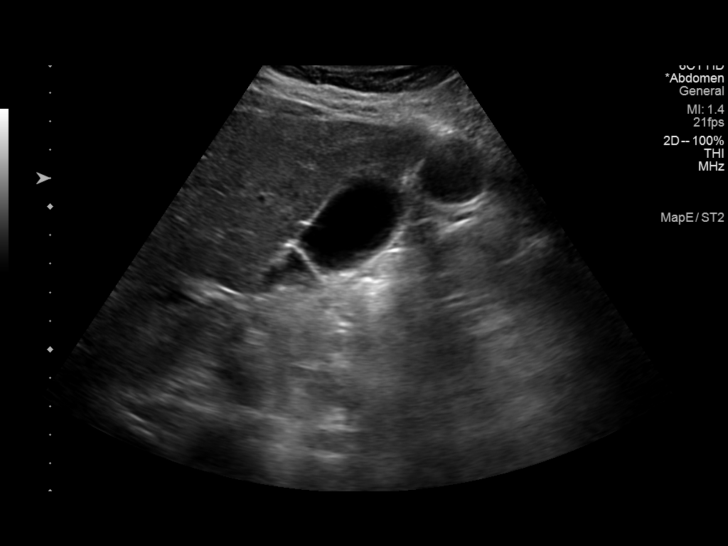
[im 28/109]
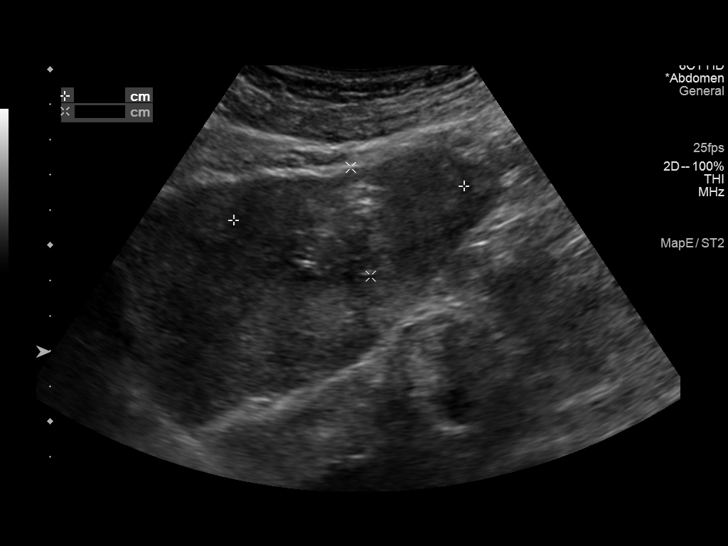
[im 37/109]
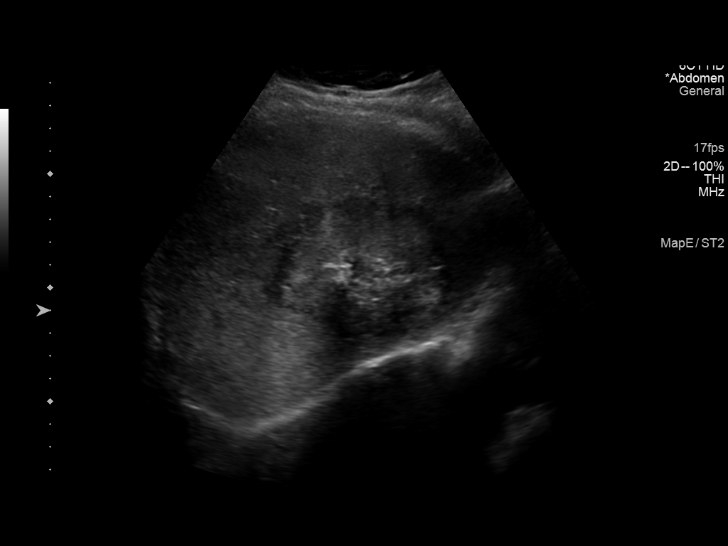
[im 46/109]
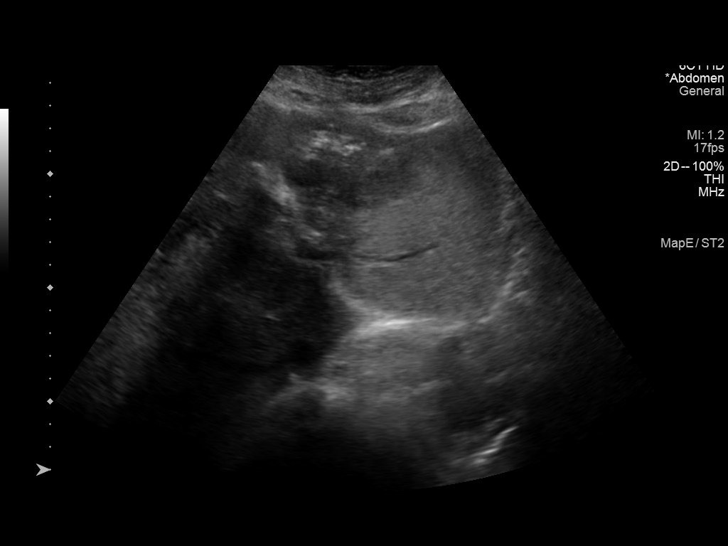
[im 55/109]
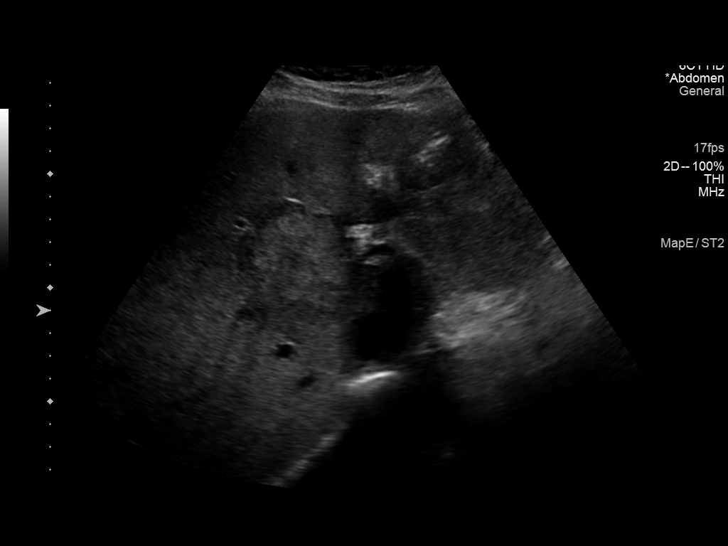
[im 64/109]
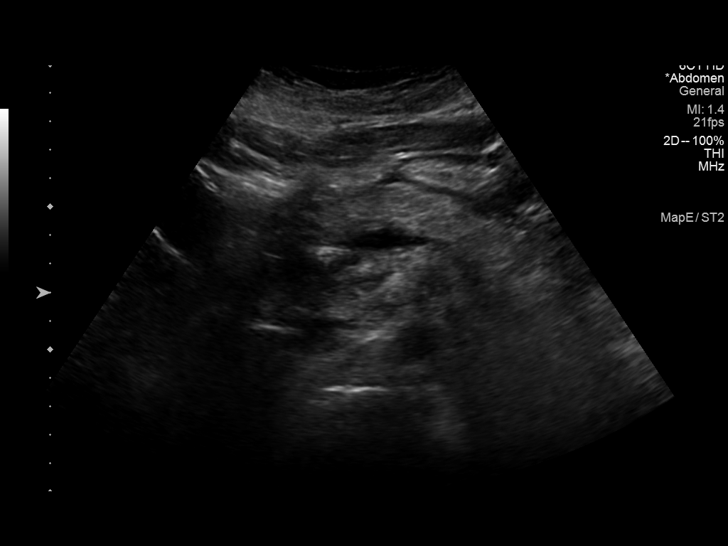
[im 73/109]
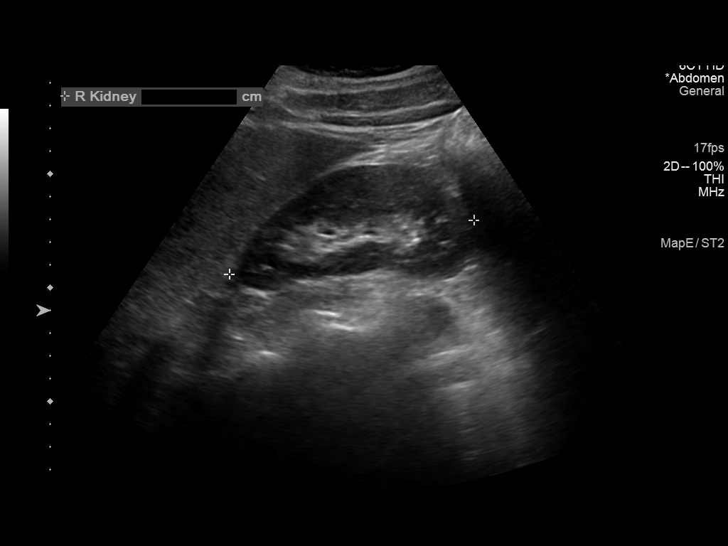
[im 82/109]
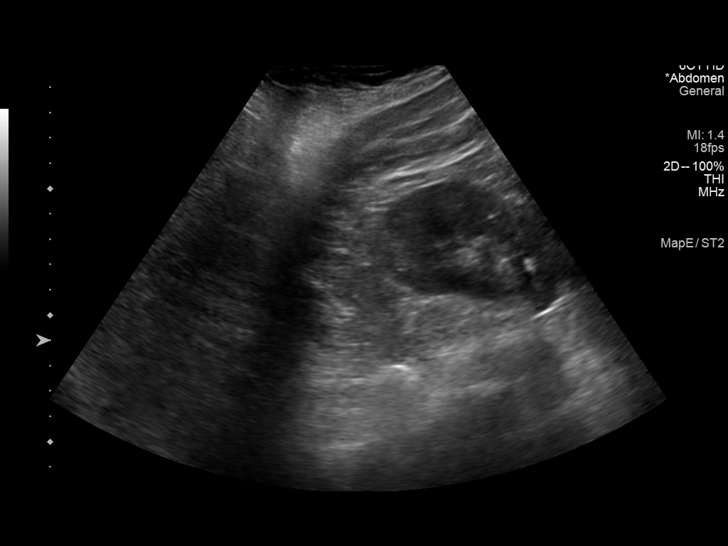
[im 91/109]
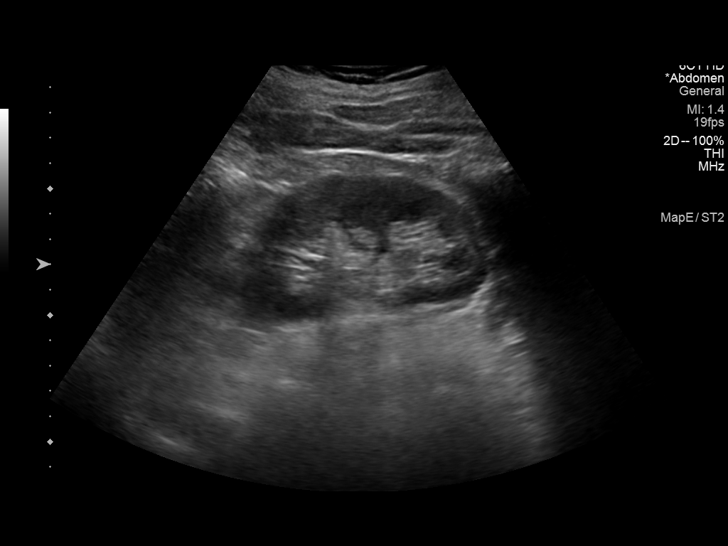
[im 100/109]
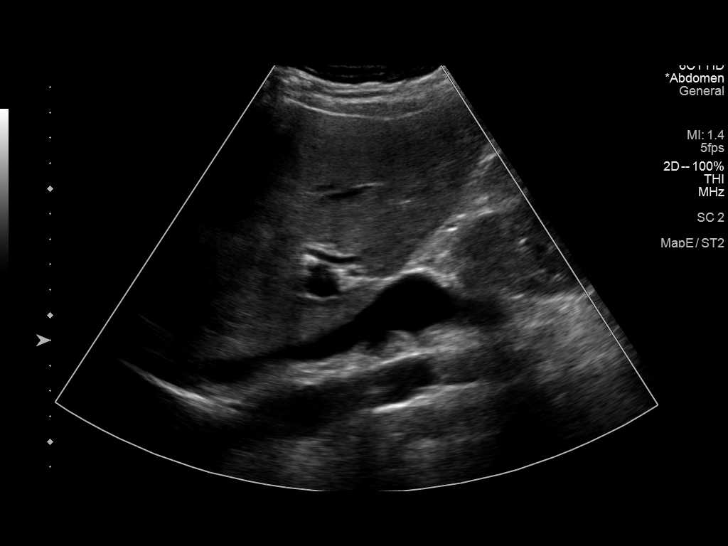
[im 109/109]
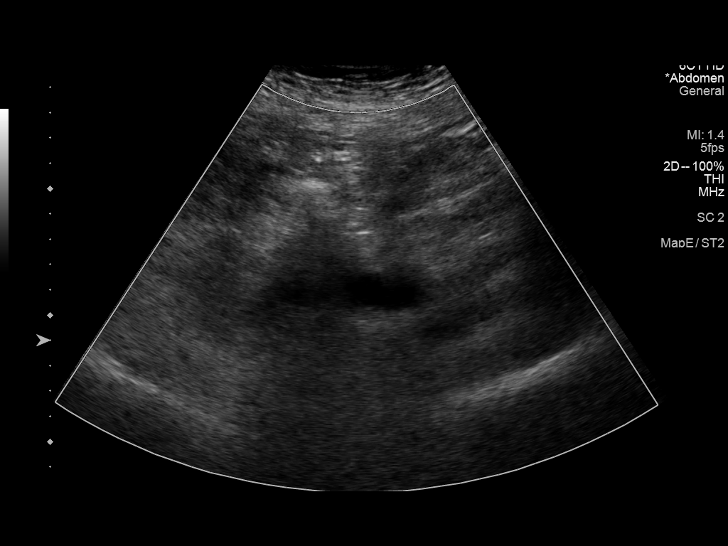

[13 of 25 positions shown; findings below may reference images not displayed]

FINDINGS: Gallbladder: No gallstones or wall thickening visualized. No
sonographic Murphy sign noted by sonographer.

Common bile duct: Diameter: 4 mm

Liver: A heterogeneous solid mass in the left hepatic lobe measures
6.7 x 3.1 x 4.3 cm. A heterogeneous solid mass in the right hepatic
lobe measures 7.1 x 6.2 x 7.7 cm. Within normal limits in
parenchymal echogenicity. Portal vein is patent on color Doppler
imaging with normal direction of blood flow towards the liver.

IVC: No abnormality visualized.

Pancreas: Visualized portion unremarkable.

Spleen: Size and appearance within normal limits.

Right Kidney: Length: 11 cm. Echogenicity within normal limits. No
mass or hydronephrosis visualized.

Left Kidney: Length: 10.5 cm. Echogenicity within normal limits. No
mass or hydronephrosis visualized.

Abdominal aorta: No aneurysm visualized. Atherosclerotic
calcifications are noted in the distal abdominal aorta.

Other findings: None.
IMPRESSION: 1. Two solid masses in the liver are nonspecific. Recommend MRI
abdomen with and without contrast for further evaluation.

## 2022-06-09 ENCOUNTER — Other Ambulatory Visit: Payer: Self-pay

## 2022-06-16 ENCOUNTER — Ambulatory Visit (HOSPITAL_COMMUNITY)
Admission: RE | Admit: 2022-06-16 | Discharge: 2022-06-16 | Disposition: A | Discharge status: Home / Self Care | Payer: Medicare (Managed Care) | Source: Ambulatory Visit | Attending: Hematology | Admitting: Hematology

## 2022-06-16 DIAGNOSIS — C221 Intrahepatic bile duct carcinoma: Secondary | ICD-10-CM | POA: Insufficient documentation

## 2022-06-16 MED ORDER — IOHEXOL 9 MG/ML PO SOLN
500.0000 mL | ORAL | Status: AC
  Administered 2022-06-16: 1000 mL via ORAL

## 2022-06-16 MED ORDER — IOHEXOL 9 MG/ML PO SOLN
ORAL | Status: AC
  Filled 2022-06-16: qty 1000

## 2022-06-16 MED ORDER — IOHEXOL 300 MG/ML  SOLN
75.0000 mL | Freq: Once | INTRAMUSCULAR | Status: AC | PRN
  Administered 2022-06-16: 75 mL via INTRAVENOUS

## 2022-06-16 MED FILL — Dexamethasone Sodium Phosphate Inj 100 MG/10ML: INTRAMUSCULAR | Qty: 1 | Status: AC

## 2022-06-17 ENCOUNTER — Inpatient Hospital Stay: Payer: Medicare (Managed Care)

## 2022-06-17 ENCOUNTER — Inpatient Hospital Stay: Payer: Medicare (Managed Care) | Attending: Hematology

## 2022-06-17 ENCOUNTER — Other Ambulatory Visit: Payer: Self-pay | Admitting: Nurse Practitioner

## 2022-06-17 ENCOUNTER — Encounter: Payer: Self-pay | Admitting: Nurse Practitioner

## 2022-06-17 ENCOUNTER — Inpatient Hospital Stay (HOSPITAL_BASED_OUTPATIENT_CLINIC_OR_DEPARTMENT_OTHER): Payer: Medicare (Managed Care) | Admitting: Nurse Practitioner

## 2022-06-17 DIAGNOSIS — Z9079 Acquired absence of other genital organ(s): Secondary | ICD-10-CM | POA: Insufficient documentation

## 2022-06-17 DIAGNOSIS — Z5111 Encounter for antineoplastic chemotherapy: Secondary | ICD-10-CM | POA: Diagnosis not present

## 2022-06-17 DIAGNOSIS — T451X5D Adverse effect of antineoplastic and immunosuppressive drugs, subsequent encounter: Secondary | ICD-10-CM | POA: Insufficient documentation

## 2022-06-17 DIAGNOSIS — R970 Elevated carcinoembryonic antigen [CEA]: Secondary | ICD-10-CM | POA: Diagnosis not present

## 2022-06-17 DIAGNOSIS — C19 Malignant neoplasm of rectosigmoid junction: Secondary | ICD-10-CM | POA: Diagnosis present

## 2022-06-17 DIAGNOSIS — C787 Secondary malignant neoplasm of liver and intrahepatic bile duct: Secondary | ICD-10-CM | POA: Diagnosis present

## 2022-06-17 DIAGNOSIS — Z79624 Long term (current) use of inhibitors of nucleotide synthesis: Secondary | ICD-10-CM | POA: Diagnosis not present

## 2022-06-17 DIAGNOSIS — E119 Type 2 diabetes mellitus without complications: Secondary | ICD-10-CM | POA: Insufficient documentation

## 2022-06-17 DIAGNOSIS — Z7982 Long term (current) use of aspirin: Secondary | ICD-10-CM | POA: Insufficient documentation

## 2022-06-17 DIAGNOSIS — C221 Intrahepatic bile duct carcinoma: Secondary | ICD-10-CM | POA: Diagnosis not present

## 2022-06-17 DIAGNOSIS — C189 Malignant neoplasm of colon, unspecified: Secondary | ICD-10-CM

## 2022-06-17 DIAGNOSIS — Z95828 Presence of other vascular implants and grafts: Secondary | ICD-10-CM

## 2022-06-17 DIAGNOSIS — Z5112 Encounter for antineoplastic immunotherapy: Secondary | ICD-10-CM | POA: Insufficient documentation

## 2022-06-17 DIAGNOSIS — Z79899 Other long term (current) drug therapy: Secondary | ICD-10-CM | POA: Diagnosis not present

## 2022-06-17 DIAGNOSIS — I10 Essential (primary) hypertension: Secondary | ICD-10-CM | POA: Diagnosis not present

## 2022-06-17 DIAGNOSIS — C7800 Secondary malignant neoplasm of unspecified lung: Secondary | ICD-10-CM | POA: Diagnosis not present

## 2022-06-17 DIAGNOSIS — D701 Agranulocytosis secondary to cancer chemotherapy: Secondary | ICD-10-CM | POA: Insufficient documentation

## 2022-06-17 DIAGNOSIS — G620 Drug-induced polyneuropathy: Secondary | ICD-10-CM | POA: Diagnosis not present

## 2022-06-17 DIAGNOSIS — Z90710 Acquired absence of both cervix and uterus: Secondary | ICD-10-CM | POA: Insufficient documentation

## 2022-06-17 LAB — CBC WITH DIFFERENTIAL (CANCER CENTER ONLY)
Abs Immature Granulocytes: 0.31 10*3/uL — ABNORMAL HIGH (ref 0.00–0.07)
Basophils Absolute: 0.1 10*3/uL (ref 0.0–0.1)
Basophils Relative: 0 %
Eosinophils Absolute: 0.1 10*3/uL (ref 0.0–0.5)
Eosinophils Relative: 0 %
HCT: 35.7 % — ABNORMAL LOW (ref 36.0–46.0)
Hemoglobin: 11.9 g/dL — ABNORMAL LOW (ref 12.0–15.0)
Immature Granulocytes: 2 %
Lymphocytes Relative: 9 %
Lymphs Abs: 1.5 10*3/uL (ref 0.7–4.0)
MCH: 30.4 pg (ref 26.0–34.0)
MCHC: 33.3 g/dL (ref 30.0–36.0)
MCV: 91.1 fL (ref 80.0–100.0)
Monocytes Absolute: 1 10*3/uL (ref 0.1–1.0)
Monocytes Relative: 6 %
Neutro Abs: 14.1 10*3/uL — ABNORMAL HIGH (ref 1.7–7.7)
Neutrophils Relative %: 83 %
Platelet Count: 201 10*3/uL (ref 150–400)
RBC: 3.92 MIL/uL (ref 3.87–5.11)
RDW: 15.9 % — ABNORMAL HIGH (ref 11.5–15.5)
WBC Count: 17 10*3/uL — ABNORMAL HIGH (ref 4.0–10.5)
nRBC: 0 % (ref 0.0–0.2)

## 2022-06-17 LAB — CMP (CANCER CENTER ONLY)
ALT: 30 U/L (ref 0–44)
AST: 17 U/L (ref 15–41)
Albumin: 4 g/dL (ref 3.5–5.0)
Alkaline Phosphatase: 120 U/L (ref 38–126)
Anion gap: 6 (ref 5–15)
BUN: 13 mg/dL (ref 8–23)
CO2: 27 mmol/L (ref 22–32)
Calcium: 9.4 mg/dL (ref 8.9–10.3)
Chloride: 101 mmol/L (ref 98–111)
Creatinine: 0.78 mg/dL (ref 0.44–1.00)
GFR, Estimated: >60 mL/min (ref >60)
Glucose, Bld: 164 mg/dL — ABNORMAL HIGH (ref 70–99)
Potassium: 4.3 mmol/L (ref 3.5–5.1)
Sodium: 134 mmol/L — ABNORMAL LOW (ref 135–145)
Total Bilirubin: 0.4 mg/dL (ref 0.3–1.2)
Total Protein: 7.9 g/dL (ref 6.5–8.1)

## 2022-06-17 LAB — CEA (ACCESS): CEA (CHCC): 2.38 ng/mL (ref 0.00–5.00)

## 2022-06-17 LAB — MAGNESIUM: Magnesium: 2.3 mg/dL (ref 1.7–2.4)

## 2022-06-17 LAB — TOTAL PROTEIN, URINE DIPSTICK: Protein, ur: NEGATIVE mg/dL

## 2022-06-17 MED ORDER — SODIUM CHLORIDE 0.9 % IV SOLN: Freq: Once | INTRAVENOUS | Status: AC

## 2022-06-17 MED ORDER — ATROPINE SULFATE 1 MG/ML IV SOLN
0.5000 mg | Freq: Once | INTRAVENOUS | Status: AC | PRN
  Administered 2022-06-17: 0.5 mg via INTRAVENOUS
  Filled 2022-06-17: qty 1

## 2022-06-17 MED ORDER — SODIUM CHLORIDE 0.9 % IV SOLN
2400.0000 mg/m2 | INTRAVENOUS | Status: DC
  Administered 2022-06-17: 5000 mg via INTRAVENOUS
  Filled 2022-06-17: qty 100

## 2022-06-17 MED ORDER — SODIUM CHLORIDE 0.9 % IV SOLN
400.0000 mg/m2 | Freq: Once | INTRAVENOUS | Status: AC
  Administered 2022-06-17: 780 mg via INTRAVENOUS
  Filled 2022-06-17: qty 39

## 2022-06-17 MED ORDER — SODIUM CHLORIDE 0.9 % IV SOLN
180.0000 mg/m2 | Freq: Once | INTRAVENOUS | Status: AC
  Administered 2022-06-17: 360 mg via INTRAVENOUS
  Filled 2022-06-17: qty 5

## 2022-06-17 MED ORDER — SODIUM CHLORIDE 0.9% FLUSH
10.0000 mL | Freq: Once | INTRAVENOUS | Status: AC
  Administered 2022-06-17: 10 mL

## 2022-06-17 MED ORDER — PALONOSETRON HCL INJECTION 0.25 MG/5ML
0.2500 mg | Freq: Once | INTRAVENOUS | Status: AC
  Administered 2022-06-17: 0.25 mg via INTRAVENOUS
  Filled 2022-06-17: qty 5

## 2022-06-17 MED ORDER — SODIUM CHLORIDE 0.9 % IV SOLN
10.0000 mg | Freq: Once | INTRAVENOUS | Status: AC
  Administered 2022-06-17: 10 mg via INTRAVENOUS
  Filled 2022-06-17: qty 10

## 2022-06-17 MED ORDER — SODIUM CHLORIDE 0.9 % IV SOLN
5.0000 mg/kg | Freq: Once | INTRAVENOUS | Status: AC
  Administered 2022-06-17: 400 mg via INTRAVENOUS
  Filled 2022-06-17: qty 16

## 2022-06-17 NOTE — Progress Notes (Signed)
Patient Care Team: Ralene Ok, MD as PCP - General (Internal Medicine) Radonna Ricker, RN (Inactive) as Oncology Nurse Navigator Malachy Mood, MD as Consulting Physician (Oncology) Jeani Hawking, MD as Consulting Physician (Gastroenterology)   CHIEF COMPLAINT: Follow-up metastatic colon cancer  Oncology History Overview Note  Cancer Staging No matching staging information was found for the patient.    metastatic colon cancer to liver  06/20/2019 Procedure   Colonoscopy by Dr Meridee Score 06/20/19  IMPRESSION -Seven 3 to 10 mm polyps in the sigmoid colon, in the transverse colon and in the escending colon, removed with a cold snare. Resected and retrireved.  -One 20mm polyp in the descending colon. Biopsies. Tattoes.  -Mediaum sized lipoma in the ascending colon.   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 See Comment B. Colon, Ascending, polyp, Polypectomy:  -TUBULAR ADENOMA -No high grade dysplasia or malignancy.  C. Colon, TRansverse, Polyo, polectomy:  -TUBULAR ADENOMA -No high grade dysplasia or malignancy.  D. Colon, Sigmoid, Polyp, Polypectomy:  -HYPERPLASTIC POLYP   11/07/2019 Imaging   US Abdomen 11/07/19  IMPRESSION: 1. Two solid masses in the liver are nonspecific. Recommend MRI abdomen with and without contrast for further evaluation.   12/15/2019 Imaging   MRI Abdomen 12/15/19  IMPRESSION: 1. There are two large masses in the liver with appearance favoring metastatic disease or hepatocellular carcinoma or cholangiocarcinoma. A benign etiology is highly unlikely given the enhancement pattern and associated adenopathy. 2. Considerable porta hepatis and retroperitoneal adenopathy. Some of the confluent porta hepatis tumor is potentially infiltrative and abuts the pancreatic body along its right upper margin, making it difficult to completely exclude the possibility of  pancreatic adenocarcinoma primary. Possibilities helpful in further workup might include tissue diagnosis, endoscopic ultrasound, or nuclear medicine PET-CT. 3. Pancreas divisum. 4. Lumbar spondylosis and degenerative disc disease. 5. Despite efforts by the technologist and patient, motion artifact is present on today's exam and could not be eliminated. This reduces exam sensitivity and specificity.   12/22/2019 Procedure   Upper Endoscopy by Dr Elnoria Howard 12/22/19  IMPRESSION - One lymph node was visualized and measured in the porta hepatis region. Fine needle aspiration performed    12/22/2019 Initial Biopsy   A. LIVER, PORTA HEPATIS MASS, FINE NEEDLE 12/22/19 ASPIRATION:  FINAL MICROSCOPIC DIAGNOSIS:  - Malignant cells consistent with metastatic adenocarcinoma   01/01/2020 Initial Diagnosis   Intrahepatic cholangiocarcinoma (HCC)   01/08/2020 Initial Biopsy   FINAL MICROSCOPIC DIAGNOSIS:   A. LIVER, LEFT LOBE, BIOPSY:  - Metastatic adenocarcinoma, consistent with a colorectal primary.  See  comment      COMMENT:   Immunohistochemical stains show the tumor cells are positive for CK20  and CDX2 but negative for CK7, consistent with above interpretation.  Dr. Mosetta Putt was notified on 01/10/2020   01/08/2020 Genetic Testing   [name]  [name] wildtype and [name]/[name] mutations which make her eligible for target biological agent Vectibix.   01/24/2020 Procedure   PAC placed 01/24/20   01/25/2020 -  Chemotherapy   first line FOLFOX starting 01/25/2020, Vextibix  added with C2 (02/06/20)   02/06/2020 - 02/06/2022 Chemotherapy   Patient is on Treatment Plan : COLORECTAL Panitumumab q14d ([name] - Type Gene Only)     06/20/2020 Imaging   CT CAP  IMPRESSION: 1. Continued interval reduction in size and conspicuity of a subsolid nodule of the peripheral left upper lobe. 2. Unchanged prominent pretracheal and subcarinal lymph nodes. 3.  Redemonstrated partially calcified  low-attenuation liver masses, slightly decreased in size compared to prior examination. 4. Slight interval decrease in size of a portacaval lymph node or conglomerate and retroperitoneal lymph nodes. 5. Findings are consistent with continued treatment response of nodal, pulmonary, and hepatic metastatic disease. 6. Coronary artery disease.   Aortic Atherosclerosis (ICD10-I70.0).     10/09/2020 Imaging   IMPRESSION: 1. Slight decrease in size of dominant liver mass with smaller liver mass within 1-2 mm of prior measurement. 2. Stable appearance of celiac lymph and retroperitoneal lymph nodes. Dominant node with calcification in the gastrohepatic ligament as described. 3. Continued decrease in size of LEFT upper lobe nodule. 4. Three-vessel coronary artery calcification. 5. Aortic atherosclerosis.   03/25/2022 -  Chemotherapy   Patient is on Treatment Plan : COLORECTAL FOLFIRI + Bevacizumab q14d        CURRENT THERAPY:  FOLFIRI/Beva q14 days, started 03/25/22   INTERVAL HISTORY Lindsay [name] returns for follow-up and treatment as scheduled, last seen by my colleague Georga Kaufmann, [place] 06/03/2022 completed cycle 6 FOLFIRI/Beva. She tolerates treatment well. Baseline diarrhea is stable. Takes medication if she has more than 3 episodes in a day. Eating and drinking well. Intermittent abdominal pain and neuropathy are stable. Denies other specific complaints.   ROS  All other systems reviewed and negative   Past Medical History:  Diagnosis Date   Arthritis    Diabetes mellitus without complication (HCC)    Hypertension    met colon ca to liver 01/2020     Past Surgical History:  Procedure Laterality Date   ABDOMINAL HYSTERECTOMY     ANTERIOR AND POSTERIOR REPAIR WITH SACROSPINOUS FIXATION N/A 07/16/2016   Procedure: ANTERIOR AND POSTERIOR REPAIR WITH SACROSPINOUS FIXATION;  Surgeon: Harold Hedge, MD;  Location: [facility];  Service: Gynecology;  Laterality: N/A;   CYSTOSCOPY  07/16/2016    Procedure: CYSTOSCOPY;  Surgeon: Harold Hedge, MD;  Location: [facility];  Service: Gynecology;;   ESOPHAGOGASTRODUODENOSCOPY (EGD) WITH PROPOFOL N/A 12/22/2019   Procedure: ESOPHAGOGASTRODUODENOSCOPY (EGD) WITH PROPOFOL;  Surgeon: Jeani Hawking, MD;  Location: [facility];  Service: Endoscopy;  Laterality: N/A;   FINE NEEDLE ASPIRATION N/A 12/22/2019   Procedure: FINE NEEDLE ASPIRATION (FNA) LINEAR;  Surgeon: Jeani Hawking, MD;  Location: [facility];  Service: Endoscopy;  Laterality: N/A;   IR IMAGING GUIDED PORT INSERTION  01/24/2020   LAPAROSCOPIC VAGINAL HYSTERECTOMY WITH SALPINGECTOMY Bilateral 07/16/2016   Procedure: LAPAROSCOPIC ASSISTED VAGINAL HYSTERECTOMY WITH SALPINGECTOMY;  Surgeon: Harold Hedge, MD;  Location: [facility];  Service: Gynecology;  Laterality: Bilateral;   MYOMECTOMY ABDOMINAL APPROACH     THYROID SURGERY     tyroid     UPPER ESOPHAGEAL ENDOSCOPIC ULTRASOUND (EUS) N/A 12/22/2019   Procedure: UPPER ESOPHAGEAL ENDOSCOPIC ULTRASOUND (EUS);  Surgeon: Jeani Hawking, MD;  Location: [facility];  Service: Endoscopy;  Laterality: N/A;     Outpatient Encounter Medications as of 06/17/2022  Medication Sig   aspirin EC 81 MG tablet Take 1 tablet (81 mg total) by mouth daily.   atorvastatin (LIPITOR) 20 MG tablet Take 20 mg by mouth at bedtime.    clindamycin (CLINDAGEL) 1 % gel Apply topically 2 (two) times daily.   clobetasol cream (TEMOVATE) 0.05 % Apply 1 application topically daily.   hydrochlorothiazide (MICROZIDE) 12.5 MG capsule Take 12.5 mg by mouth daily.   KLOR-CON M20 20 MEQ tablet TAKE 1 TABLET TWICE A DAY   lidocaine-prilocaine (EMLA) cream Apply 1 application topically as needed.   magic mouthwash w/lidocaine SOLN Take 5  mLs by mouth 4 (four) times daily.   magnesium oxide (MAG-OX) 400 (240 Mg) MG tablet Take 2 tablets (800 mg total) by mouth 4 (four) times daily.   MAGNESIUM-OXIDE 400 (240 Mg) MG tablet TAKE 2 TABLETS BY MOUTH THREE TIMES DAILY   omeprazole  (PRILOSEC) 20 MG capsule Take 1 capsule (20 mg total) by mouth daily. (Patient not taking: Reported on 06/03/2022)   ondansetron (ZOFRAN) 8 MG tablet TAKE 1 TABLET BY MOUTH EVERY 8 HOURS AS NEEDED FOR NAUSEA OR VOMITING   prochlorperazine (COMPAZINE) 10 MG tablet Take 1 tablet (10 mg total) by mouth every 6 (six) hours as needed for nausea or vomiting.   spironolactone (ALDACTONE) 25 MG tablet Take 25 mg by mouth daily with breakfast.   urea (CARMOL) 10 % cream Apply topically 2 (two) times daily.   valACYclovir (VALTREX) 1000 MG tablet Take 1 tablet (1,000 mg total) by mouth 2 (two) times daily.   verapamil (CALAN) 120 MG tablet Take 120 mg by mouth 3 (three) times daily.   Facility-Administered Encounter Medications as of 06/17/2022  Medication   [COMPLETED] sodium chloride flush (NS) 0.9 % injection 10 mL     Today's Vitals   06/17/22 1140  BP: 127/76  Pulse: 76  Resp: 18  Temp: 97.7 F (36.5 C)  TempSrc: Oral  SpO2: 100%  Weight: 184 lb 8 oz (83.7 kg)  Height: 5\' 6"  (1.676 m)   Body mass index is 29.78 kg/m.   PHYSICAL EXAM GENERAL:alert, no distress and comfortable SKIN: no rash  EYES: sclera clear LUNGS: normal breathing effort NEURO: alert & oriented x 3 with fluent speech, no focal motor deficits     CBC    Component Value Date/Time   WBC 17.0 (H) 06/17/2022 1113   WBC 5.3 03/20/2022 0748   RBC 3.92 06/17/2022 1113   HGB 11.9 (L) 06/17/2022 1113   HCT 35.7 (L) 06/17/2022 1113   PLT 201 06/17/2022 1113   MCV 91.1 06/17/2022 1113   MCH 30.4 06/17/2022 1113   MCHC 33.3 06/17/2022 1113   RDW 15.9 (H) 06/17/2022 1113   LYMPHSABS 1.5 06/17/2022 1113   MONOABS 1.0 06/17/2022 1113   EOSABS 0.1 06/17/2022 1113   BASOSABS 0.1 06/17/2022 1113     CMP     Component Value Date/Time   NA 134 (L) 06/17/2022 1113   K 4.3 06/17/2022 1113   CL 101 06/17/2022 1113   CO2 27 06/17/2022 1113   GLUCOSE 164 (H) 06/17/2022 1113   BUN 13 06/17/2022 1113   CREATININE 0.78  06/17/2022 1113   CALCIUM 9.4 06/17/2022 1113   PROT 7.9 06/17/2022 1113   ALBUMIN 4.0 06/17/2022 1113   AST 17 06/17/2022 1113   ALT 30 06/17/2022 1113   ALKPHOS 120 06/17/2022 1113   BILITOT 0.4 06/17/2022 1113   GFRNONAA >60 06/17/2022 1113   GFRAA >60 07/09/2016 0836     ASSESSMENT & PLAN:Nolan E Chesley MiresFlythe is a 67 y.o. 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  -Liver biopsy 01/08/20 confirmed metastasis from primary colorectal cancer. PET scan showed hypermetabolism to known liver mets, diffuse thoracic and abdominal lymphadenopathy, a 1.8 cm LUL pulmonary nodule, and splenic flexure of colon. -treated with first line FOLFOX 01/25/20 - 10/02/20, Vectibix added with C2. Oxali discontinued after C18 due to reaction. -switched to Xeloda  10/21/20, dose adjusted due to significant skin toxicity -Last CT CAP 03/19/22 showed progression in liver and gastrohepatic ligament LN; she began FOLFIRI/beva on 03/25/22 -Ms. Hourihan appears stable. S/p cycle 6 tolerating well without significant side effects. Dirrhea, intermittent pain, and neuropathy are stable and adequately managed. She is able to recover and function well with good PS.  -I reviewed restaging CT CAP 06/16/22 which shows slight decrease in size of new liver met from 1/11, and stable disease; no new sites or progression. 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 The patient knows to call the clinic with any problems, questions or concerns. No barriers to learning were detected. I spent 20 minutes counseling the patient face to face. The total time spent in the appointment was 30 minutes and more than 50% was on counseling, review of test results, and coordination of care.   Santiago Glad, NP-C 06/17/2022

## 2022-06-17 NOTE — Patient Instructions (Signed)
[place]  Discharge Instructions: Thank you for choosing [facility] to provide your oncology and hematology care.   If you have a lab appointment with the Cancer Center, please go directly to the Cancer Center and check in at the registration area.   Wear comfortable clothing and clothing appropriate for easy access to any Portacath or PICC line.   We strive to give you quality time with your provider. You may need to reschedule your appointment if you arrive late (15 or more minutes).  Arriving late affects you and other patients whose appointments are after yours.  Also, if you miss three or more appointments without notifying the office, you may be dismissed from the clinic at the provider's discretion.      For prescription refill requests, have your pharmacy contact our office and allow 72 hours for refills to be completed.    Today you received the following chemotherapy and/or immunotherapy agents irinotecan, leucovorin, bevacizumab      To help prevent nausea and vomiting after your treatment, we encourage you to take your nausea medication as directed.  BELOW ARE SYMPTOMS THAT SHOULD BE REPORTED IMMEDIATELY: *FEVER GREATER THAN 100.4 F (38 C) OR HIGHER *CHILLS OR SWEATING *NAUSEA AND VOMITING THAT IS NOT CONTROLLED WITH YOUR NAUSEA MEDICATION *UNUSUAL SHORTNESS OF BREATH *UNUSUAL BRUISING OR BLEEDING *URINARY PROBLEMS (pain or burning when urinating, or frequent urination) *BOWEL PROBLEMS (unusual diarrhea, constipation, pain near the anus) TENDERNESS IN MOUTH AND THROAT WITH OR WITHOUT PRESENCE OF ULCERS (sore throat, sores in mouth, or a toothache) UNUSUAL RASH, SWELLING OR PAIN  UNUSUAL VAGINAL DISCHARGE OR ITCHING   Items with * indicate a potential emergency and should be followed up as soon as possible or go to the Emergency Department if any problems should occur.  Please show the CHEMOTHERAPY ALERT CARD or  IMMUNOTHERAPY ALERT CARD at check-in to the Emergency Department and triage nurse.  Should you have questions after your visit or need to cancel or reschedule your appointment, please contact [facility] [facility]  Dept: [phone]  and follow the prompts.  Office hours are 8:00 a.m. to 4:30 p.m. Monday - Friday. Please note that voicemails left after 4:00 p.m. may not be returned until the following business day.  We are closed weekends and major holidays. You have access to a nurse at all times for urgent questions. Please call the main number to the clinic Dept: [phone] and follow the prompts.   For any non-urgent questions, you may also contact your provider using MyChart. We now offer e-Visits for anyone 83 and older to request care online for non-urgent symptoms. For details visit [email or web address].   Also download the MyChart app! Go to the app store, search "MyChart", open the app, select [facility], and log in with your MyChart username and password.

## 2022-06-19 ENCOUNTER — Inpatient Hospital Stay: Payer: Medicare (Managed Care)

## 2022-06-19 VITALS — BP 118/70 | HR 64 | Temp 98.7°F | Resp 18

## 2022-06-19 DIAGNOSIS — Z5112 Encounter for antineoplastic immunotherapy: Secondary | ICD-10-CM | POA: Diagnosis not present

## 2022-06-19 DIAGNOSIS — C221 Intrahepatic bile duct carcinoma: Secondary | ICD-10-CM

## 2022-06-19 MED ORDER — HEPARIN SOD (PORK) LOCK FLUSH 100 UNIT/ML IV SOLN
500.0000 [IU] | Freq: Once | INTRAVENOUS | Status: AC | PRN
  Administered 2022-06-19: 500 [IU]

## 2022-06-19 MED ORDER — SODIUM CHLORIDE 0.9% FLUSH
10.0000 mL | INTRAVENOUS | Status: DC | PRN
  Administered 2022-06-19: 10 mL

## 2022-06-22 ENCOUNTER — Other Ambulatory Visit: Payer: Self-pay | Admitting: Hematology

## 2022-06-30 MED FILL — Dexamethasone Sodium Phosphate Inj 100 MG/10ML: INTRAMUSCULAR | Qty: 1 | Status: AC

## 2022-06-30 NOTE — Assessment & Plan Note (Signed)
MSS, [name]/[name] wildtype -diagnosed 12/2019 by porta hepatis LN biopsy during EUS for work up of abdominal pain and liver lesions seen on [place] and MRI. Liver biopsy 01/08/20 confirmed metastasis from primary colorectal cancer. PET scan showed hypermetabolism to known liver mets, diffuse thoracic and abdominal lymphadenopathy, a 1.8 cm LUL pulmonary nodule, and splenic flexure of colon. -treated with first line FOLFOX 01/25/20 - 10/02/20, Vectibix added with C2. Oxali discontinued after C18 due to reaction. -switched to Xeloda 10/21/20, dose adjusted due to significant skin toxicity -due to cancer progression on recent staging scan, treatment has been changed to FOLFIRI and bevacizumab on 03/25/22, she is overall tolerating well -Restaging CT scan from 06/16/2022 showed stable to mild decrease in size of treated liver metastasis. No new lesions

## 2022-06-30 NOTE — Assessment & Plan Note (Signed)
-  started after cycle 5, oxaliplatin dose reduced and eventually discontinued after reaction 09/18/20 -Persistent numbness at the feet and hands, mild overall    

## 2022-07-01 ENCOUNTER — Encounter: Payer: Self-pay | Admitting: Hematology

## 2022-07-01 ENCOUNTER — Other Ambulatory Visit: Payer: Self-pay | Admitting: *Deleted

## 2022-07-01 ENCOUNTER — Inpatient Hospital Stay: Payer: Medicare (Managed Care)

## 2022-07-01 ENCOUNTER — Inpatient Hospital Stay (HOSPITAL_BASED_OUTPATIENT_CLINIC_OR_DEPARTMENT_OTHER): Payer: Medicare (Managed Care) | Admitting: Hematology

## 2022-07-01 VITALS — BP 142/77 | HR 86 | Temp 98.5°F | Resp 20 | Ht 66.0 in | Wt 183.6 lb

## 2022-07-01 DIAGNOSIS — T451X5A Adverse effect of antineoplastic and immunosuppressive drugs, initial encounter: Secondary | ICD-10-CM

## 2022-07-01 DIAGNOSIS — C221 Intrahepatic bile duct carcinoma: Secondary | ICD-10-CM

## 2022-07-01 DIAGNOSIS — Z5112 Encounter for antineoplastic immunotherapy: Secondary | ICD-10-CM | POA: Diagnosis not present

## 2022-07-01 DIAGNOSIS — G620 Drug-induced polyneuropathy: Secondary | ICD-10-CM | POA: Diagnosis not present

## 2022-07-01 LAB — CMP (CANCER CENTER ONLY)
ALT: 13 U/L (ref 0–44)
AST: 13 U/L — ABNORMAL LOW (ref 15–41)
Albumin: 3.9 g/dL (ref 3.5–5.0)
Alkaline Phosphatase: 72 U/L (ref 38–126)
Anion gap: 5 (ref 5–15)
BUN: 18 mg/dL (ref 8–23)
CO2: 30 mmol/L (ref 22–32)
Calcium: 9.8 mg/dL (ref 8.9–10.3)
Chloride: 104 mmol/L (ref 98–111)
Creatinine: 0.69 mg/dL (ref 0.44–1.00)
GFR, Estimated: >60 mL/min (ref >60)
Glucose, Bld: 121 mg/dL — ABNORMAL HIGH (ref 70–99)
Potassium: 4.3 mmol/L (ref 3.5–5.1)
Sodium: 139 mmol/L (ref 135–145)
Total Bilirubin: 0.3 mg/dL (ref 0.3–1.2)
Total Protein: 7.4 g/dL (ref 6.5–8.1)

## 2022-07-01 LAB — CBC (CANCER CENTER ONLY)
HCT: 36 % (ref 36.0–46.0)
Hemoglobin: 12 g/dL (ref 12.0–15.0)
MCH: 30.5 pg (ref 26.0–34.0)
MCHC: 33.3 g/dL (ref 30.0–36.0)
MCV: 91.6 fL (ref 80.0–100.0)
Platelet Count: 273 10*3/uL (ref 150–400)
RBC: 3.93 MIL/uL (ref 3.87–5.11)
RDW: 15.9 % — ABNORMAL HIGH (ref 11.5–15.5)
WBC Count: 4.6 10*3/uL (ref 4.0–10.5)
nRBC: 0 % (ref 0.0–0.2)

## 2022-07-01 LAB — DIFFERENTIAL
Abs Immature Granulocytes: 0.01 10*3/uL (ref 0.00–0.07)
Basophils Absolute: 0 10*3/uL (ref 0.0–0.1)
Basophils Relative: 1 %
Eosinophils Absolute: 0.1 10*3/uL (ref 0.0–0.5)
Eosinophils Relative: 2 %
Immature Granulocytes: 0 %
Lymphocytes Relative: 25 %
Lymphs Abs: 1.2 10*3/uL (ref 0.7–4.0)
Monocytes Absolute: 0.6 10*3/uL (ref 0.1–1.0)
Monocytes Relative: 13 %
Neutro Abs: 2.7 10*3/uL (ref 1.7–7.7)
Neutrophils Relative %: 59 %

## 2022-07-01 LAB — TOTAL PROTEIN, URINE DIPSTICK: Protein, ur: NEGATIVE mg/dL

## 2022-07-01 MED ORDER — SODIUM CHLORIDE 0.9 % IV SOLN
2400.0000 mg/m2 | INTRAVENOUS | Status: DC
  Administered 2022-07-01: 5000 mg via INTRAVENOUS
  Filled 2022-07-01: qty 100

## 2022-07-01 MED ORDER — SODIUM CHLORIDE 0.9 % IV SOLN
180.0000 mg/m2 | Freq: Once | INTRAVENOUS | Status: AC
  Administered 2022-07-01: 360 mg via INTRAVENOUS
  Filled 2022-07-01: qty 18

## 2022-07-01 MED ORDER — SODIUM CHLORIDE 0.9 % IV SOLN: Freq: Once | INTRAVENOUS | Status: AC

## 2022-07-01 MED ORDER — SODIUM CHLORIDE 0.9 % IV SOLN
400.0000 mg/m2 | Freq: Once | INTRAVENOUS | Status: AC
  Administered 2022-07-01: 780 mg via INTRAVENOUS
  Filled 2022-07-01: qty 25

## 2022-07-01 MED ORDER — SODIUM CHLORIDE 0.9 % IV SOLN
10.0000 mg | Freq: Once | INTRAVENOUS | Status: AC
  Administered 2022-07-01: 10 mg via INTRAVENOUS
  Filled 2022-07-01: qty 10

## 2022-07-01 MED ORDER — ATROPINE SULFATE 1 MG/ML IV SOLN
0.5000 mg | Freq: Once | INTRAVENOUS | Status: AC | PRN
  Administered 2022-07-01: 0.5 mg via INTRAVENOUS
  Filled 2022-07-01: qty 1

## 2022-07-01 MED ORDER — SODIUM CHLORIDE 0.9 % IV SOLN
5.0000 mg/kg | Freq: Once | INTRAVENOUS | Status: AC
  Administered 2022-07-01: 400 mg via INTRAVENOUS
  Filled 2022-07-01: qty 16

## 2022-07-01 MED ORDER — SODIUM CHLORIDE 0.9% FLUSH
10.0000 mL | INTRAVENOUS | Status: DC | PRN
  Administered 2022-07-01: 10 mL

## 2022-07-01 MED ORDER — PALONOSETRON HCL INJECTION 0.25 MG/5ML
0.2500 mg | Freq: Once | INTRAVENOUS | Status: AC
  Administered 2022-07-01: 0.25 mg via INTRAVENOUS
  Filled 2022-07-01: qty 5

## 2022-07-01 NOTE — Progress Notes (Signed)
[facility]   [phone] [phone]   Clinic Follow up Note   Patient Care Team: Ralene Ok, MD as PCP - General (Internal Medicine) Radonna Ricker, RN (Inactive) as Oncology Nurse Navigator Malachy Mood, MD as Consulting Physician (Oncology) Jeani Hawking, MD as Consulting Physician (Gastroenterology)  Date of Service:  07/01/2022  CHIEF COMPLAINT: f/u of metastatic colon cancer   CURRENT THERAPY:  FOLFIRI/Beva q14 days, started 03/25/22    ASSESSMENT:  Lindsay [name] is a 67 y.o. female with   metastatic colon 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 Liver biopsy 01/08/20 confirmed metastasis from primary colorectal cancer. PET scan showed hypermetabolism to known liver mets, diffuse thoracic and abdominal lymphadenopathy, a 1.8 cm LUL pulmonary nodule, and splenic flexure of colon. -treated with first line FOLFOX 01/25/20 - 10/02/20, Vectibix added with C2. Oxali discontinued after C18 due to reaction. -switched to Xeloda 10/21/20, dose adjusted due to significant skin toxicity -due to cancer progression on recent staging scan, treatment has been changed to FOLFIRI and bevacizumab on 03/25/22, she is overall tolerating well -Restaging CT scan from 06/16/2022 showed stable to mild decrease in size of treated liver metastasis. No new lesions -She is tolerating chemotherapy well overall, will continue  Peripheral neuropathy due to chemotherapy (HCC) -started after cycle 5, oxaliplatin dose reduced and eventually discontinued after reaction 09/18/20 -Persistent numbness at the feet and hands, mild overall        PLAN: - Continue C8  FOLFIRI/BEVA today if labs are adequate -- Follow-up in 2 weeks before next cycle chemo  SUMMARY OF ONCOLOGIC HISTORY: Oncology History Overview Note  Cancer Staging No matching staging  information was found for the patient.    metastatic colon cancer to liver  06/20/2019 Procedure   Colonoscopy by Dr Meridee Score 06/20/19  IMPRESSION -Seven 3 to 10 mm polyps in the sigmoid colon, in the transverse colon and in the escending colon, removed with a cold snare. Resected and retrireved.  -One 20mm polyp in the descending colon. Biopsies. Tattoes.  -Mediaum sized lipoma in the ascending colon.   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 See Comment B. Colon, Ascending, polyp, Polypectomy:  -TUBULAR ADENOMA -No high grade dysplasia or malignancy.  C. Colon, TRansverse, Polyo, polectomy:  -TUBULAR ADENOMA -No high grade dysplasia or malignancy.  D. Colon, Sigmoid, Polyp, Polypectomy:  -HYPERPLASTIC POLYP   11/07/2019 Imaging   US Abdomen 11/07/19  IMPRESSION: 1. Two solid masses in the liver are nonspecific. Recommend MRI abdomen with and without contrast for further evaluation.   12/15/2019 Imaging   MRI Abdomen 12/15/19  IMPRESSION: 1. There are two large masses in the liver with appearance favoring metastatic disease or hepatocellular carcinoma or cholangiocarcinoma. A benign etiology is highly unlikely given the enhancement pattern and associated adenopathy. 2. Considerable porta hepatis and retroperitoneal adenopathy. Some of the confluent porta hepatis tumor is potentially infiltrative and abuts the pancreatic body along its right upper margin, making it difficult to completely exclude the possibility of pancreatic adenocarcinoma primary. Possibilities helpful in further workup might include tissue diagnosis, endoscopic ultrasound, or nuclear medicine PET-CT. 3. Pancreas divisum. 4. Lumbar spondylosis and degenerative disc disease. 5. Despite efforts by the technologist and patient, motion artifact is present on today's exam and could not be eliminated. This reduces exam  sensitivity and specificity.   12/22/2019 Procedure   Upper Endoscopy by Dr Elnoria Howard 12/22/19  IMPRESSION - One lymph node was visualized and measured in the porta hepatis region. Fine needle aspiration performed    12/22/2019 Initial Biopsy   A. LIVER, PORTA HEPATIS MASS, FINE NEEDLE 12/22/19 ASPIRATION:  FINAL MICROSCOPIC DIAGNOSIS:  - Malignant cells consistent with metastatic adenocarcinoma   01/01/2020 Initial Diagnosis   Intrahepatic cholangiocarcinoma (HCC)   01/08/2020 Initial Biopsy   FINAL MICROSCOPIC DIAGNOSIS:   A. LIVER, LEFT LOBE, BIOPSY:  - Metastatic adenocarcinoma, consistent with a colorectal primary.  See  comment      COMMENT:   Immunohistochemical stains show the tumor cells are positive for CK20  and CDX2 but negative for CK7, consistent with above interpretation.  Dr. Mosetta Putt was notified on 01/10/2020   01/08/2020 Genetic Testing   [name]  [name] wildtype and [name]/[name] mutations which make her eligible for target biological agent Vectibix.   01/24/2020 Procedure   PAC placed 01/24/20   01/25/2020 -  Chemotherapy   first line FOLFOX starting 01/25/2020, Vextibix  added with C2 (02/06/20)   02/06/2020 - 02/06/2022 Chemotherapy   Patient is on Treatment Plan : COLORECTAL Panitumumab q14d ([name] Wild - Type Gene Only)     06/20/2020 Imaging   CT CAP  IMPRESSION: 1. Continued interval reduction in size and conspicuity of a subsolid nodule of the peripheral left upper lobe. 2. Unchanged prominent pretracheal and subcarinal lymph nodes. 3. Redemonstrated partially calcified low-attenuation liver masses, slightly decreased in size compared to prior examination. 4. Slight interval decrease in size of a portacaval lymph node or conglomerate and retroperitoneal lymph nodes. 5. Findings are consistent with continued treatment response of nodal, pulmonary, and hepatic metastatic disease. 6. Coronary artery disease.   Aortic Atherosclerosis  (ICD10-I70.0).     10/09/2020 Imaging   IMPRESSION: 1. Slight decrease in size of dominant liver mass with smaller liver mass within 1-2 mm of prior measurement. 2. Stable appearance of celiac lymph and retroperitoneal lymph nodes. Dominant node with calcification in the gastrohepatic ligament as described. 3. Continued decrease in size of LEFT upper lobe nodule. 4. Three-vessel coronary artery calcification. 5. Aortic atherosclerosis.   03/25/2022 -  Chemotherapy   Patient is on Treatment Plan : COLORECTAL FOLFIRI + Bevacizumab q14d        INTERVAL HISTORY:  Lindsay [name] is here for a follow up of metastatic colon cancer . She was last seen by NP Lacie on 06/17/2022. She presents to the clinic alone. Pt state that she is doing well. Pt does experience some diarrhea and she take imodium and she goes about 3x. Pt report her appetite is is good.     All other systems were reviewed with the patient and are negative.  MEDICAL HISTORY:  Past Medical History:  Diagnosis Date   Arthritis    Diabetes mellitus without complication    Hypertension    met colon ca to liver 01/2020    SURGICAL HISTORY: Past Surgical History:  Procedure Laterality Date   ABDOMINAL HYSTERECTOMY     ANTERIOR AND POSTERIOR REPAIR WITH SACROSPINOUS FIXATION N/A 07/16/2016   Procedure: ANTERIOR AND POSTERIOR REPAIR WITH SACROSPINOUS FIXATION;  Surgeon: Harold Hedge, MD;  Location: [facility];  Service: Gynecology;  Laterality: N/A;   CYSTOSCOPY  07/16/2016   Procedure: CYSTOSCOPY;  Surgeon: Harold Hedge, MD;  Location: [facility];  Service: Gynecology;;   ESOPHAGOGASTRODUODENOSCOPY (EGD) WITH PROPOFOL N/A 12/22/2019   Procedure: ESOPHAGOGASTRODUODENOSCOPY (EGD) WITH PROPOFOL;  Surgeon: Jeani Hawking, MD;  Location: [facility];  Service: Endoscopy;  Laterality: N/A;   FINE NEEDLE ASPIRATION N/A 12/22/2019   Procedure: FINE NEEDLE ASPIRATION (FNA) LINEAR;  Surgeon: Jeani Hawking, MD;  Location: [facility];   Service: Endoscopy;  Laterality: N/A;   IR IMAGING GUIDED PORT INSERTION  01/24/2020   LAPAROSCOPIC VAGINAL HYSTERECTOMY WITH SALPINGECTOMY Bilateral 07/16/2016   Procedure: LAPAROSCOPIC ASSISTED VAGINAL HYSTERECTOMY WITH SALPINGECTOMY;  Surgeon: Harold Hedge, MD;  Location: [facility];  Service: Gynecology;  Laterality: Bilateral;   MYOMECTOMY ABDOMINAL APPROACH     THYROID SURGERY     tyroid     UPPER ESOPHAGEAL ENDOSCOPIC ULTRASOUND (EUS) N/A 12/22/2019   Procedure: UPPER ESOPHAGEAL ENDOSCOPIC ULTRASOUND (EUS);  Surgeon: Jeani Hawking, MD;  Location: [facility];  Service: Endoscopy;  Laterality: N/A;    I have reviewed the social history and family history with the patient and they are unchanged from previous note.  ALLERGIES:  is allergic to oxaliplatin.  MEDICATIONS:  Current Outpatient Medications  Medication Sig Dispense Refill   aspirin EC 81 MG tablet Take 1 tablet (81 mg total) by mouth daily. 90 tablet 3   atorvastatin (LIPITOR) 20 MG tablet Take 20 mg by mouth at bedtime.   1   clindamycin (CLINDAGEL) 1 % gel Apply topically 2 (two) times daily. 60 g 2   clobetasol cream (TEMOVATE) 0.05 % Apply 1 application topically daily.     hydrochlorothiazide (MICROZIDE) 12.5 MG capsule Take 12.5 mg by mouth daily.     KLOR-CON M20 20 MEQ tablet TAKE 1 TABLET TWICE A DAY 180 tablet 3   lidocaine-prilocaine (EMLA) cream Apply 1 application topically as needed. 30 g 1   magic mouthwash w/lidocaine SOLN Take 5 mLs by mouth 4 (four) times daily. 475 mL 2   MAGNESIUM-OXIDE 400 (240 Mg) MG tablet TAKE 2 TABLETS BY MOUTH THREE TIMES DAILY 180 tablet 0   MAGNESIUM-OXIDE 400 (240 Mg) MG tablet TAKE 2 TABLETS BY MOUTH 4 TIMES DAILY 240 tablet 0   omeprazole (PRILOSEC) 20 MG capsule Take 1 capsule (20 mg total) by mouth daily. (Patient not taking: Reported on 06/03/2022) 30 capsule 2   ondansetron (ZOFRAN) 8 MG tablet TAKE 1 TABLET BY MOUTH EVERY 8 HOURS AS NEEDED FOR NAUSEA OR VOMITING 20 tablet 0    prochlorperazine (COMPAZINE) 10 MG tablet Take 1 tablet (10 mg total) by mouth every 6 (six) hours as needed for nausea or vomiting. 30 tablet 2   spironolactone (ALDACTONE) 25 MG tablet Take 25 mg by mouth daily with breakfast.     urea (CARMOL) 10 % cream Apply topically 2 (two) times daily. 71 g 2   valACYclovir (VALTREX) 1000 MG tablet Take 1 tablet (1,000 mg total) by mouth 2 (two) times daily. 10 tablet 0   verapamil (CALAN) 120 MG tablet Take 120 mg by mouth 3 (three) times daily.     No current facility-administered medications for this visit.   Facility-Administered Medications Ordered in Other Visits  Medication Dose Route Frequency Provider Last Rate Last Admin   atropine injection 0.5 mg  0.5 mg Intravenous Once PRN Malachy Mood, MD       bevacizumab-awwb (MVASI) 400 mg in sodium chloride 0.9 % 100 mL chemo infusion  5 mg/kg (Treatment Plan Recorded) Intravenous Once Malachy Mood, MD 696 mL/hr at 07/01/22 1231 400 mg at 07/01/22 1231   fluorouracil (ADRUCIL) 5,000 mg in sodium chloride 0.9 % 150 mL chemo infusion  2,400 mg/m2 (Treatment Plan Recorded) Intravenous 1  day or 1 dose Malachy Mood, MD       irinotecan (CAMPTOSAR) 360 mg in sodium chloride 0.9 % 500 mL chemo infusion  180 mg/m2 (Treatment Plan Recorded) Intravenous Once Malachy Mood, MD       leucovorin 780 mg in sodium chloride 0.9 % 250 mL infusion  400 mg/m2 (Treatment Plan Recorded) Intravenous Once Malachy Mood, MD       sodium chloride flush (NS) 0.9 % injection 10 mL  10 mL Intracatheter PRN Malachy Mood, MD        PHYSICAL EXAMINATION: ECOG PERFORMANCE STATUS: 1 - Symptomatic but completely ambulatory  Vitals:   07/01/22 1043  BP: (!) 142/77  Pulse: 86  Resp: 20  Temp: 98.5 F (36.9 C)  SpO2: 100%   Wt Readings from Last 3 Encounters:  07/01/22 183 lb 9.6 oz (83.3 kg)  06/17/22 184 lb 8 oz (83.7 kg)  06/03/22 183 lb 6.4 oz (83.2 kg)     GENERAL:alert, no distress and comfortable SKIN: skin color normal, no rashes  or significant lesions EYES: normal, Conjunctiva are pink and non-injected, sclera clear  NEURO: alert & oriented x 3 with fluent speech   LABORATORY DATA:  I have reviewed the data as listed    Latest Ref Rng & Units 07/01/2022   10:45 AM 06/17/2022   11:13 AM 06/03/2022   11:08 AM  CBC  WBC 4.0 - 10.5 K/uL 4.6  17.0  4.1   Hemoglobin 12.0 - 15.0 g/dL 16.1  09.6  04.5   Hematocrit 36.0 - 46.0 % 36.0  35.7  36.9   Platelets 150 - 400 K/uL 273  201  224         Latest Ref Rng & Units 07/01/2022   10:17 AM 06/17/2022   11:13 AM 06/03/2022   11:08 AM  CMP  Glucose 70 - 99 mg/dL 409  811  914   BUN 8 - 23 mg/dL 18  13  10    Creatinine 0.44 - 1.00 mg/dL 7.82  9.56  2.13   Sodium 135 - 145 mmol/L 139  134  138   Potassium 3.5 - 5.1 mmol/L 4.3  4.3  4.3   Chloride 98 - 111 mmol/L 104  101  104   CO2 22 - 32 mmol/L 30  27  28    Calcium 8.9 - 10.3 mg/dL 9.8  9.4  9.5   Total Protein 6.5 - 8.1 g/dL 7.4  7.9  7.5   Total Bilirubin 0.3 - 1.2 mg/dL 0.3  0.4  0.3   Alkaline Phos 38 - 126 U/L 72  120  79   AST 15 - 41 U/L 13  17  19    ALT 0 - 44 U/L 13  30  28        RADIOGRAPHIC STUDIES: I have personally reviewed the radiological images as listed and agreed with the findings in the report. No results found.    Orders Placed This Encounter  Procedures   CBC (Cancer Center Only)   Differential   Magnesium    Standing Status:   Standing    Number of Occurrences:   20    Standing Expiration Date:   07/01/2023   CBC with Differential (Cancer Center Only)    Standing Status:   Future    Standing Expiration Date:   07/15/2023   CMP (Cancer Center only)    Standing Status:   Future    Standing Expiration Date:   07/15/2023   Total Protein,  Urine dipstick    Standing Status:   Future    Standing Expiration Date:   07/15/2023   CBC with Differential (Cancer Center Only)    Standing Status:   Future    Standing Expiration Date:   07/29/2023   CMP (Cancer Center only)    Standing Status:    Future    Standing Expiration Date:   07/29/2023   Total Protein, Urine dipstick    Standing Status:   Future    Standing Expiration Date:   07/29/2023   All questions were answered. The patient knows to call the clinic with any problems, questions or concerns. No barriers to learning was detected. The total time spent in the appointment was 30 minutes.     Malachy Mood, MD 07/01/2022   Carolin Coy, CMA, am acting as scribe for Malachy Mood, MD.   I have reviewed the above documentation for accuracy and completeness, and I agree with the above.

## 2022-07-01 NOTE — Patient Instructions (Signed)
[facility]  Discharge Instructions: Thank you for choosing [facility] to provide your oncology and hematology care.   If you have a lab appointment with the Cancer Center, please go directly to the Cancer Center and check in at the registration area.   Wear comfortable clothing and clothing appropriate for easy access to any Portacath or PICC line.   We strive to give you quality time with your provider. You may need to reschedule your appointment if you arrive late (15 or more minutes).  Arriving late affects you and other patients whose appointments are after yours.  Also, if you miss three or more appointments without notifying the office, you may be dismissed from the clinic at the provider's discretion.      For prescription refill requests, have your pharmacy contact our office and allow 72 hours for refills to be completed.    Today you received the following chemotherapy and/or immunotherapy agents Mvasi, Irinotecan, Leucovorin, 5FU pump      To help prevent nausea and vomiting after your treatment, we encourage you to take your nausea medication as directed.  BELOW ARE SYMPTOMS THAT SHOULD BE REPORTED IMMEDIATELY: *FEVER GREATER THAN 100.4 F (38 C) OR HIGHER *CHILLS OR SWEATING *NAUSEA AND VOMITING THAT IS NOT CONTROLLED WITH YOUR NAUSEA MEDICATION *UNUSUAL SHORTNESS OF BREATH *UNUSUAL BRUISING OR BLEEDING *URINARY PROBLEMS (pain or burning when urinating, or frequent urination) *BOWEL PROBLEMS (unusual diarrhea, constipation, pain near the anus) TENDERNESS IN MOUTH AND THROAT WITH OR WITHOUT PRESENCE OF ULCERS (sore throat, sores in mouth, or a toothache) UNUSUAL RASH, SWELLING OR PAIN  UNUSUAL VAGINAL DISCHARGE OR ITCHING   Items with * indicate a potential emergency and should be followed up as soon as possible or go to the Emergency Department if any problems should occur.  Please show the CHEMOTHERAPY ALERT CARD or  IMMUNOTHERAPY ALERT CARD at check-in to the Emergency Department and triage nurse.  Should you have questions after your visit or need to cancel or reschedule your appointment, please contact [place]  Dept: [phone]  and follow the prompts.  Office hours are 8:00 a.m. to 4:30 p.m. Monday - Friday. Please note that voicemails left after 4:00 p.m. may not be returned until the following business day.  We are closed weekends and major holidays. You have access to a nurse at all times for urgent questions. Please call the main number to the clinic Dept: [phone] and follow the prompts.   For any non-urgent questions, you may also contact your provider using MyChart. We now offer e-Visits for anyone 26 and older to request care online for non-urgent symptoms. For details visit [email or web address].   Also download the MyChart app! Go to the app store, search "MyChart", open the app, select [place], and log in with your MyChart username and password.

## 2022-07-02 ENCOUNTER — Other Ambulatory Visit: Payer: Self-pay

## 2022-07-03 ENCOUNTER — Inpatient Hospital Stay: Payer: Medicare (Managed Care)

## 2022-07-03 VITALS — BP 122/65 | HR 80 | Resp 18

## 2022-07-03 DIAGNOSIS — C221 Intrahepatic bile duct carcinoma: Secondary | ICD-10-CM

## 2022-07-03 DIAGNOSIS — Z5112 Encounter for antineoplastic immunotherapy: Secondary | ICD-10-CM | POA: Diagnosis not present

## 2022-07-03 MED ORDER — PEGFILGRASTIM-CBQV 6 MG/0.6ML ~~LOC~~ SOSY
6.0000 mg | PREFILLED_SYRINGE | Freq: Once | SUBCUTANEOUS | Status: AC
  Administered 2022-07-03: 6 mg via SUBCUTANEOUS
  Filled 2022-07-03: qty 0.6

## 2022-07-03 MED ORDER — HEPARIN SOD (PORK) LOCK FLUSH 100 UNIT/ML IV SOLN
500.0000 [IU] | Freq: Once | INTRAVENOUS | Status: AC | PRN
  Administered 2022-07-03: 500 [IU]

## 2022-07-03 MED ORDER — SODIUM CHLORIDE 0.9% FLUSH
10.0000 mL | INTRAVENOUS | Status: DC | PRN
  Administered 2022-07-03: 10 mL

## 2022-07-09 ENCOUNTER — Telehealth: Payer: Self-pay | Admitting: Hematology

## 2022-07-09 NOTE — Telephone Encounter (Signed)
Called patient regarding May/June appointments, patient is notified.  

## 2022-07-14 MED FILL — Dexamethasone Sodium Phosphate Inj 100 MG/10ML: INTRAMUSCULAR | Qty: 1 | Status: AC

## 2022-07-14 NOTE — Progress Notes (Unsigned)
Lindsay [name]   [phone] [phone]   Clinic Follow up Note   Patient Care Team: Ralene Ok, MD as PCP - General (Internal Medicine) Radonna Ricker, RN (Inactive) as Oncology Nurse Navigator Malachy Mood, MD as Consulting Physician (Oncology) Jeani Hawking, MD as Consulting Physician (Gastroenterology)  Date of Service:  07/15/2022  CHIEF COMPLAINT: f/u of metastatic colon cancer   CURRENT THERAPY: FOLFIRI/Beva q14 days, started 03/25/22      ASSESSMENT:  Lindsay [name] is a 67 y.o. female with   Peripheral neuropathy due to chemotherapy [name]) -started after cycle 5, oxaliplatin dose reduced and eventually discontinued after reaction 09/18/20 -Persistent numbness at the feet and hands, mild overall       metastatic colon 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 Liver biopsy 01/08/20 confirmed metastasis from primary colorectal cancer. PET scan showed hypermetabolism to known liver mets, diffuse thoracic and abdominal lymphadenopathy, a 1.8 cm LUL pulmonary nodule, and splenic flexure of colon. -treated with first line FOLFOX 01/25/20 - 10/02/20, Vectibix added with C2. Oxali discontinued after C18 due to reaction. -switched to Xeloda 10/21/20, dose adjusted due to significant skin toxicity -due to cancer progression on recent staging scan, treatment has been changed to FOLFIRI and bevacizumab on 03/25/22, she is overall tolerating well -Restaging CT scan from 06/16/2022 showed stable to mild decrease in size of treated liver metastasis. No new lesions -She is tolerating chemotherapy well overall, will continue -Lab reviewed, adequate for treatment. -Next restaging scan in later July or August   PLAN: -lab reviewed -I refill magic wash -continue with Folfiri/Beva today , labs ar adequate for treatment. -lab/flush and  Folfiri/beva 5/21  SUMMARY OF ONCOLOGIC HISTORY: Oncology History  metastatic colon cancer to liver  06/20/2019 Procedure   Colonoscopy by Dr Meridee Score 06/20/19  IMPRESSION -Seven 3 to 10 mm polyps in the sigmoid colon, in the transverse colon and in the escending colon, removed with a cold snare. Resected and retrireved.  -One 20mm polyp in the descending colon. Biopsies. Tattoes.  -Mediaum sized lipoma in the ascending colon.   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 See Comment B. Colon, Ascending, polyp, Polypectomy:  -TUBULAR ADENOMA -No high grade dysplasia or malignancy.  C. Colon, TRansverse, Polyo, polectomy:  -TUBULAR ADENOMA -No high grade dysplasia or malignancy.  D. Colon, Sigmoid, Polyp, Polypectomy:  -HYPERPLASTIC POLYP   11/07/2019 Imaging   US Abdomen 11/07/19  IMPRESSION: 1. Two solid masses in the liver are nonspecific. Recommend MRI abdomen with and without contrast for further evaluation.   12/15/2019 Imaging   MRI Abdomen 12/15/19  IMPRESSION: 1. There are two large masses in the liver with appearance favoring metastatic disease or hepatocellular carcinoma or cholangiocarcinoma. A benign etiology is highly unlikely given the enhancement pattern and associated adenopathy. 2. Considerable porta hepatis and retroperitoneal adenopathy. Some of the confluent porta hepatis tumor is potentially infiltrative and abuts the pancreatic body along its right upper margin, making it difficult to completely exclude the possibility of pancreatic adenocarcinoma primary. Possibilities helpful in further workup might include tissue diagnosis, endoscopic ultrasound, or nuclear medicine PET-CT. 3. Pancreas divisum. 4. Lumbar spondylosis and degenerative disc disease. 5. Despite efforts by the technologist and patient, motion artifact is present on today's exam and could not be eliminated.  This reduces exam sensitivity  and specificity.   12/22/2019 Procedure   Upper Endoscopy by Dr Elnoria Howard 12/22/19  IMPRESSION - One lymph node was visualized and measured in the porta hepatis region. Fine needle aspiration performed    12/22/2019 Initial Biopsy   A. LIVER, PORTA HEPATIS MASS, FINE NEEDLE 12/22/19 ASPIRATION:  FINAL MICROSCOPIC DIAGNOSIS:  - Malignant cells consistent with metastatic adenocarcinoma   01/01/2020 Initial Diagnosis   Intrahepatic cholangiocarcinoma (HCC)   01/08/2020 Initial Biopsy   FINAL MICROSCOPIC DIAGNOSIS:   A. LIVER, LEFT LOBE, BIOPSY:  - Metastatic adenocarcinoma, consistent with a colorectal primary.  See  comment      COMMENT:   Immunohistochemical stains show the tumor cells are positive for CK20  and CDX2 but negative for CK7, consistent with above interpretation.  Dr. Mosetta Putt was notified on 01/10/2020   01/08/2020 Genetic Testing   [name]  [name] wildtype and [name]/[name] mutations which make her eligible for target biological agent Vectibix.   01/24/2020 Procedure   PAC placed 01/24/20   01/25/2020 -  Chemotherapy   first line FOLFOX starting 01/25/2020, Vextibix  added with C2 (02/06/20)   02/06/2020 - 02/06/2022 Chemotherapy   Patient is on Treatment Plan : COLORECTAL Panitumumab q14d ([name] - Type Gene Only)     06/20/2020 Imaging   CT CAP  IMPRESSION: 1. Continued interval reduction in size and conspicuity of a subsolid nodule of the peripheral left upper lobe. 2. Unchanged prominent pretracheal and subcarinal lymph nodes. 3. Redemonstrated partially calcified low-attenuation liver masses, slightly decreased in size compared to prior examination. 4. Slight interval decrease in size of a portacaval lymph node or conglomerate and retroperitoneal lymph nodes. 5. Findings are consistent with continued treatment response of nodal, pulmonary, and hepatic metastatic disease. 6. Coronary artery disease.   Aortic  Atherosclerosis (ICD10-I70.0).     10/09/2020 Imaging   IMPRESSION: 1. Slight decrease in size of dominant liver mass with smaller liver mass within 1-2 mm of prior measurement. 2. Stable appearance of celiac lymph and retroperitoneal lymph nodes. Dominant node with calcification in the gastrohepatic ligament as described. 3. Continued decrease in size of LEFT upper lobe nodule. 4. Three-vessel coronary artery calcification. 5. Aortic atherosclerosis.   03/25/2022 -  Chemotherapy   Patient is on Treatment Plan : COLORECTAL FOLFIRI + Bevacizumab q14d        INTERVAL HISTORY:  Lindsay [name] is here for a follow up of metastatic colon cancer. She was last seen by me on 07/01/2022. She presents to the clinic Pt state that she tolerated last treatment very well. She state she has some darkness to her hands and Neuropathy in hands and feet but is tolerable.    All other systems were reviewed with the patient and are negative.  MEDICAL HISTORY:  Past Medical History:  Diagnosis Date   Arthritis    Diabetes mellitus without complication (HCC)    Hypertension    met colon ca to liver 01/2020    SURGICAL HISTORY: Past Surgical History:  Procedure Laterality Date   ABDOMINAL HYSTERECTOMY     ANTERIOR AND POSTERIOR REPAIR WITH SACROSPINOUS FIXATION N/A 07/16/2016   Procedure: ANTERIOR AND POSTERIOR REPAIR WITH SACROSPINOUS FIXATION;  Surgeon: Harold Hedge, MD;  Location: [facility];  Service: Gynecology;  Laterality: N/A;   CYSTOSCOPY  07/16/2016   Procedure: CYSTOSCOPY;  Surgeon: Harold Hedge, MD;  Location: [facility];  Service: Gynecology;;   ESOPHAGOGASTRODUODENOSCOPY (EGD) WITH PROPOFOL N/A 12/22/2019   Procedure: ESOPHAGOGASTRODUODENOSCOPY (EGD) WITH PROPOFOL;  Surgeon: Jeani Hawking, MD;  Location: [facility];  Service: Endoscopy;  Laterality: N/A;   FINE NEEDLE ASPIRATION N/A 12/22/2019   Procedure: FINE NEEDLE ASPIRATION (FNA) LINEAR;  Surgeon: Jeani Hawking, MD;  Location: [facility];  Service: Endoscopy;  Laterality: N/A;   IR IMAGING GUIDED PORT INSERTION  01/24/2020   LAPAROSCOPIC VAGINAL HYSTERECTOMY WITH SALPINGECTOMY Bilateral 07/16/2016   Procedure: LAPAROSCOPIC ASSISTED VAGINAL HYSTERECTOMY WITH SALPINGECTOMY;  Surgeon: Harold Hedge, MD;  Location: [facility];  Service: Gynecology;  Laterality: Bilateral;   MYOMECTOMY ABDOMINAL APPROACH     THYROID SURGERY     tyroid     UPPER ESOPHAGEAL ENDOSCOPIC ULTRASOUND (EUS) N/A 12/22/2019   Procedure: UPPER ESOPHAGEAL ENDOSCOPIC ULTRASOUND (EUS);  Surgeon: Jeani Hawking, MD;  Location: [facility];  Service: Endoscopy;  Laterality: N/A;    I have reviewed the social history and family history with the patient and they are unchanged from previous note.  ALLERGIES:  is allergic to oxaliplatin.  MEDICATIONS:  Current Outpatient Medications  Medication Sig Dispense Refill   aspirin EC 81 MG tablet Take 1 tablet (81 mg total) by mouth daily. 90 tablet 3   atorvastatin (LIPITOR) 20 MG tablet Take 20 mg by mouth at bedtime.   1   clindamycin (CLINDAGEL) 1 % gel Apply topically 2 (two) times daily. 60 g 2   clobetasol cream (TEMOVATE) 0.05 % Apply 1 application topically daily.     hydrochlorothiazide (MICROZIDE) 12.5 MG capsule Take 12.5 mg by mouth daily.     KLOR-CON M20 20 MEQ tablet TAKE 1 TABLET TWICE A DAY 180 tablet 3   lidocaine-prilocaine (EMLA) cream Apply 1 application topically as needed. 30 g 1   magic mouthwash w/lidocaine SOLN Take 5 mLs by mouth 4 (four) times daily. 475 mL 2   MAGNESIUM-OXIDE 400 (240 Mg) MG tablet TAKE 2 TABLETS BY MOUTH THREE TIMES DAILY 180 tablet 0   MAGNESIUM-OXIDE 400 (240 Mg) MG tablet TAKE 2 TABLETS BY MOUTH 4 TIMES DAILY 240 tablet 0   omeprazole (PRILOSEC) 20 MG capsule Take 1 capsule (20 mg total) by mouth daily. (Patient not taking: Reported on 06/03/2022) 30 capsule 2   ondansetron (ZOFRAN) 8 MG tablet TAKE 1 TABLET BY MOUTH EVERY 8 HOURS AS NEEDED FOR NAUSEA OR VOMITING  20 tablet 0   prochlorperazine (COMPAZINE) 10 MG tablet Take 1 tablet (10 mg total) by mouth every 6 (six) hours as needed for nausea or vomiting. 30 tablet 2   spironolactone (ALDACTONE) 25 MG tablet Take 25 mg by mouth daily with breakfast.     urea (CARMOL) 10 % cream Apply topically 2 (two) times daily. 71 g 2   valACYclovir (VALTREX) 1000 MG tablet Take 1 tablet (1,000 mg total) by mouth 2 (two) times daily. 10 tablet 0   verapamil (CALAN) 120 MG tablet Take 120 mg by mouth 3 (three) times daily.     No current facility-administered medications for this visit.    PHYSICAL EXAMINATION: ECOG PERFORMANCE STATUS: 0 - Asymptomatic  Vitals:   07/15/22 0930  BP: 119/75  Pulse: 70  Resp: 16  Temp: 98.7 F (37.1 C)  SpO2: 100%   Wt Readings from Last 3 Encounters:  07/15/22 184 lb 11.2 oz (83.8 kg)  07/01/22 183 lb 9.6 oz (83.3 kg)  06/17/22 184 lb 8 oz (83.7 kg)    GENERAL:alert, no distress and comfortable SKIN: skin color normal, no rashes or significant lesions EYES: normal, Conjunctiva are pink and non-injected, sclera clear  NEURO: alert & oriented x 3 with  fluent speech NECK:(-)  supple, thyroid normal size, non-tender, without nodularity LYMPH: (-) no palpable lymphadenopathy in the cervical, axillary  LUNGS: (-) clear to auscultation and percussion with normal breathing effort HEART: regular rate & rhythm and(+)murmurs and (-) no lower extremity edema ABDOMEN:(-)abdomen soft, (-) non-tender and (-) normal bowel sounds LABORATORY DATA:  I have reviewed the data as listed    Latest Ref Rng & Units 07/15/2022    8:48 AM 07/01/2022   10:45 AM 06/17/2022   11:13 AM  CBC  WBC 4.0 - 10.5 K/uL 9.9  4.6  17.0   Hemoglobin 12.0 - 15.0 g/dL 32.4  40.1  02.7   Hematocrit 36.0 - 46.0 % 35.3  36.0  35.7   Platelets 150 - 400 K/uL 215  273  201         Latest Ref Rng & Units 07/15/2022    8:48 AM 07/01/2022   10:17 AM 06/17/2022   11:13 AM  CMP  Glucose 70 - 99 mg/dL 253  664   403   BUN 8 - 23 mg/dL 13  18  13    Creatinine 0.44 - 1.00 mg/dL 4.74  2.59  5.63   Sodium 135 - 145 mmol/L 138  139  134   Potassium 3.5 - 5.1 mmol/L 4.0  4.3  4.3   Chloride 98 - 111 mmol/L 105  104  101   CO2 22 - 32 mmol/L 29  30  27    Calcium 8.9 - 10.3 mg/dL 9.4  9.8  9.4   Total Protein 6.5 - 8.1 g/dL 7.4  7.4  7.9   Total Bilirubin 0.3 - 1.2 mg/dL 0.2  0.3  0.4   Alkaline Phos 38 - 126 U/L 104  72  120   AST 15 - 41 U/L 15  13  17    ALT 0 - 44 U/L 21  13  30        RADIOGRAPHIC STUDIES: I have personally reviewed the radiological images as listed and agreed with the findings in the report. No results found.    Orders Placed This Encounter  Procedures   CBC with Differential ([name] Only)    Standing Status:   Future    Standing Expiration Date:   08/12/2023   CMP ([name] only)    Standing Status:   Future    Standing Expiration Date:   08/12/2023   Total Protein, Urine dipstick    Standing Status:   Future    Standing Expiration Date:   08/12/2023   CBC with Differential ([name] Only)    Standing Status:   Future    Standing Expiration Date:   08/26/2023   CMP ([name])    Standing Status:   Future    Standing Expiration Date:   08/26/2023   Total Protein, Urine dipstick    Standing Status:   Future    Standing Expiration Date:   08/26/2023   CBC with Differential ([name] Only)    Standing Status:   Future    Standing Expiration Date:   09/09/2023   CMP ([name])    Standing Status:   Future    Standing Expiration Date:   09/09/2023   Total Protein, Urine dipstick    Standing Status:   Future    Standing Expiration Date:   09/09/2023   CBC with Differential ([name])    Standing Status:   Future    Standing Expiration Date:  09/23/2023   CMP ([name])    Standing Status:   Future    Standing Expiration Date:   09/23/2023   Total Protein, Urine dipstick    Standing Status:   Future    Standing  Expiration Date:   09/23/2023   All questions were answered. The patient knows to call the clinic with any problems, questions or concerns. No barriers to learning was detected. The total time spent in the appointment was 25 minutes.     Malachy Mood, MD 07/15/2022   Carolin Coy, CMA, am acting as scribe for Malachy Mood, MD.   I have reviewed the above documentation for accuracy and completeness, and I agree with the above.

## 2022-07-15 ENCOUNTER — Inpatient Hospital Stay: Payer: Medicare (Managed Care) | Attending: Hematology | Admitting: Hematology

## 2022-07-15 ENCOUNTER — Encounter: Payer: Self-pay | Admitting: Hematology

## 2022-07-15 ENCOUNTER — Inpatient Hospital Stay: Payer: Medicare (Managed Care)

## 2022-07-15 VITALS — BP 119/75 | HR 70 | Temp 98.7°F | Resp 16 | Ht 66.0 in | Wt 184.7 lb

## 2022-07-15 DIAGNOSIS — C221 Intrahepatic bile duct carcinoma: Secondary | ICD-10-CM | POA: Diagnosis not present

## 2022-07-15 DIAGNOSIS — C787 Secondary malignant neoplasm of liver and intrahepatic bile duct: Secondary | ICD-10-CM | POA: Insufficient documentation

## 2022-07-15 DIAGNOSIS — Z90710 Acquired absence of both cervix and uterus: Secondary | ICD-10-CM | POA: Insufficient documentation

## 2022-07-15 DIAGNOSIS — Z5111 Encounter for antineoplastic chemotherapy: Secondary | ICD-10-CM | POA: Insufficient documentation

## 2022-07-15 DIAGNOSIS — Z5112 Encounter for antineoplastic immunotherapy: Secondary | ICD-10-CM | POA: Diagnosis present

## 2022-07-15 DIAGNOSIS — T451X5A Adverse effect of antineoplastic and immunosuppressive drugs, initial encounter: Secondary | ICD-10-CM | POA: Diagnosis not present

## 2022-07-15 DIAGNOSIS — Z5189 Encounter for other specified aftercare: Secondary | ICD-10-CM | POA: Insufficient documentation

## 2022-07-15 DIAGNOSIS — C7800 Secondary malignant neoplasm of unspecified lung: Secondary | ICD-10-CM | POA: Diagnosis not present

## 2022-07-15 DIAGNOSIS — C19 Malignant neoplasm of rectosigmoid junction: Secondary | ICD-10-CM | POA: Diagnosis present

## 2022-07-15 DIAGNOSIS — T451X5D Adverse effect of antineoplastic and immunosuppressive drugs, subsequent encounter: Secondary | ICD-10-CM | POA: Diagnosis not present

## 2022-07-15 DIAGNOSIS — G620 Drug-induced polyneuropathy: Secondary | ICD-10-CM | POA: Diagnosis not present

## 2022-07-15 DIAGNOSIS — R197 Diarrhea, unspecified: Secondary | ICD-10-CM | POA: Diagnosis not present

## 2022-07-15 DIAGNOSIS — Z79899 Other long term (current) drug therapy: Secondary | ICD-10-CM | POA: Insufficient documentation

## 2022-07-15 LAB — CMP (CANCER CENTER ONLY)
ALT: 21 U/L (ref 0–44)
AST: 15 U/L (ref 15–41)
Albumin: 4 g/dL (ref 3.5–5.0)
Alkaline Phosphatase: 104 U/L (ref 38–126)
Anion gap: 4 — ABNORMAL LOW (ref 5–15)
BUN: 13 mg/dL (ref 8–23)
CO2: 29 mmol/L (ref 22–32)
Calcium: 9.4 mg/dL (ref 8.9–10.3)
Chloride: 105 mmol/L (ref 98–111)
Creatinine: 0.65 mg/dL (ref 0.44–1.00)
GFR, Estimated: >60 mL/min (ref >60)
Glucose, Bld: 139 mg/dL — ABNORMAL HIGH (ref 70–99)
Potassium: 4 mmol/L (ref 3.5–5.1)
Sodium: 138 mmol/L (ref 135–145)
Total Bilirubin: 0.2 mg/dL — ABNORMAL LOW (ref 0.3–1.2)
Total Protein: 7.4 g/dL (ref 6.5–8.1)

## 2022-07-15 LAB — CBC WITH DIFFERENTIAL (CANCER CENTER ONLY)
Abs Immature Granulocytes: 0.07 10*3/uL (ref 0.00–0.07)
Basophils Absolute: 0.1 10*3/uL (ref 0.0–0.1)
Basophils Relative: 1 %
Eosinophils Absolute: 0.1 10*3/uL (ref 0.0–0.5)
Eosinophils Relative: 1 %
HCT: 35.3 % — ABNORMAL LOW (ref 36.0–46.0)
Hemoglobin: 11.3 g/dL — ABNORMAL LOW (ref 12.0–15.0)
Immature Granulocytes: 1 %
Lymphocytes Relative: 15 %
Lymphs Abs: 1.5 10*3/uL (ref 0.7–4.0)
MCH: 29.9 pg (ref 26.0–34.0)
MCHC: 32 g/dL (ref 30.0–36.0)
MCV: 93.4 fL (ref 80.0–100.0)
Monocytes Absolute: 0.7 10*3/uL (ref 0.1–1.0)
Monocytes Relative: 7 %
Neutro Abs: 7.6 10*3/uL (ref 1.7–7.7)
Neutrophils Relative %: 75 %
Platelet Count: 215 10*3/uL (ref 150–400)
RBC: 3.78 MIL/uL — ABNORMAL LOW (ref 3.87–5.11)
RDW: 16.8 % — ABNORMAL HIGH (ref 11.5–15.5)
WBC Count: 9.9 10*3/uL (ref 4.0–10.5)
nRBC: 0 % (ref 0.0–0.2)

## 2022-07-15 LAB — TOTAL PROTEIN, URINE DIPSTICK: Protein, ur: NEGATIVE mg/dL

## 2022-07-15 LAB — MAGNESIUM: Magnesium: 1.9 mg/dL (ref 1.7–2.4)

## 2022-07-15 MED ORDER — ATROPINE SULFATE 1 MG/ML IV SOLN
0.5000 mg | Freq: Once | INTRAVENOUS | Status: AC | PRN
  Administered 2022-07-15: 0.5 mg via INTRAVENOUS
  Filled 2022-07-15: qty 1

## 2022-07-15 MED ORDER — SODIUM CHLORIDE 0.9 % IV SOLN
10.0000 mg | Freq: Once | INTRAVENOUS | Status: AC
  Administered 2022-07-15: 10 mg via INTRAVENOUS
  Filled 2022-07-15: qty 10

## 2022-07-15 MED ORDER — PALONOSETRON HCL INJECTION 0.25 MG/5ML
0.2500 mg | Freq: Once | INTRAVENOUS | Status: AC
  Administered 2022-07-15: 0.25 mg via INTRAVENOUS
  Filled 2022-07-15: qty 5

## 2022-07-15 MED ORDER — SODIUM CHLORIDE 0.9 % IV SOLN
2400.0000 mg/m2 | INTRAVENOUS | Status: DC
  Administered 2022-07-15: 5000 mg via INTRAVENOUS
  Filled 2022-07-15: qty 100

## 2022-07-15 MED ORDER — SODIUM CHLORIDE 0.9 % IV SOLN
180.0000 mg/m2 | Freq: Once | INTRAVENOUS | Status: AC
  Administered 2022-07-15: 360 mg via INTRAVENOUS
  Filled 2022-07-15: qty 18

## 2022-07-15 MED ORDER — SODIUM CHLORIDE 0.9 % IV SOLN
400.0000 mg/m2 | Freq: Once | INTRAVENOUS | Status: AC
  Administered 2022-07-15: 780 mg via INTRAVENOUS
  Filled 2022-07-15: qty 25

## 2022-07-15 MED ORDER — SODIUM CHLORIDE 0.9 % IV SOLN: Freq: Once | INTRAVENOUS | Status: AC

## 2022-07-15 MED ORDER — SODIUM CHLORIDE 0.9 % IV SOLN
5.0000 mg/kg | Freq: Once | INTRAVENOUS | Status: AC
  Administered 2022-07-15: 400 mg via INTRAVENOUS
  Filled 2022-07-15: qty 16

## 2022-07-15 NOTE — Assessment & Plan Note (Signed)
-  started after cycle 5, oxaliplatin dose reduced and eventually discontinued after reaction 09/18/20 -Persistent numbness at the feet and hands, mild overall    

## 2022-07-15 NOTE — Patient Instructions (Signed)
[facility]  Discharge Instructions: Thank you for choosing [facility] to provide your oncology and hematology care.   If you have a lab appointment with the Cancer Center, please go directly to the Cancer Center and check in at the registration area.   Wear comfortable clothing and clothing appropriate for easy access to any Portacath or PICC line.   We strive to give you quality time with your provider. You may need to reschedule your appointment if you arrive late (15 or more minutes).  Arriving late affects you and other patients whose appointments are after yours.  Also, if you miss three or more appointments without notifying the office, you may be dismissed from the clinic at the provider's discretion.      For prescription refill requests, have your pharmacy contact our office and allow 72 hours for refills to be completed.    Today you received the following chemotherapy and/or immunotherapy agents irinotecan, bevacizumab, fluorourcil      To help prevent nausea and vomiting after your treatment, we encourage you to take your nausea medication as directed.  BELOW ARE SYMPTOMS THAT SHOULD BE REPORTED IMMEDIATELY: *FEVER GREATER THAN 100.4 F (38 C) OR HIGHER *CHILLS OR SWEATING *NAUSEA AND VOMITING THAT IS NOT CONTROLLED WITH YOUR NAUSEA MEDICATION *UNUSUAL SHORTNESS OF BREATH *UNUSUAL BRUISING OR BLEEDING *URINARY PROBLEMS (pain or burning when urinating, or frequent urination) *BOWEL PROBLEMS (unusual diarrhea, constipation, pain near the anus) TENDERNESS IN MOUTH AND THROAT WITH OR WITHOUT PRESENCE OF ULCERS (sore throat, sores in mouth, or a toothache) UNUSUAL RASH, SWELLING OR PAIN  UNUSUAL VAGINAL DISCHARGE OR ITCHING   Items with * indicate a potential emergency and should be followed up as soon as possible or go to the Emergency Department if any problems should occur.  Please show the CHEMOTHERAPY ALERT CARD or  IMMUNOTHERAPY ALERT CARD at check-in to the Emergency Department and triage nurse.  Should you have questions after your visit or need to cancel or reschedule your appointment, please contact [facility]  Dept: [phone]  and follow the prompts.  Office hours are 8:00 a.m. to 4:30 p.m. Monday - Friday. Please note that voicemails left after 4:00 p.m. may not be returned until the following business day.  We are closed weekends and major holidays. You have access to a nurse at all times for urgent questions. Please call the main number to the clinic Dept: [phone] and follow the prompts.   For any non-urgent questions, you may also contact your provider using MyChart. We now offer e-Visits for anyone 83 and older to request care online for non-urgent symptoms. For details visit [email or web address].   Also download the MyChart app! Go to the app store, search "MyChart", open the app, select [place], and log in with your MyChart username and password.

## 2022-07-15 NOTE — Assessment & Plan Note (Signed)
MSS, [name]/[name] wildtype -diagnosed 12/2019 by porta hepatis LN biopsy during EUS for work up of abdominal pain and liver lesions seen on [place] and MRI. Liver biopsy 01/08/20 confirmed metastasis from primary colorectal cancer. PET scan showed hypermetabolism to known liver mets, diffuse thoracic and abdominal lymphadenopathy, a 1.8 cm LUL pulmonary nodule, and splenic flexure of colon. -treated with first line FOLFOX 01/25/20 - 10/02/20, Vectibix added with C2. Oxali discontinued after C18 due to reaction. -switched to Xeloda 10/21/20, dose adjusted due to significant skin toxicity -due to cancer progression on recent staging scan, treatment has been changed to FOLFIRI and bevacizumab on 03/25/22, she is overall tolerating well -Restaging CT scan from 06/16/2022 showed stable to mild decrease in size of treated liver metastasis. No new lesions -She is tolerating chemotherapy well overall, will continue

## 2022-07-16 ENCOUNTER — Other Ambulatory Visit: Payer: Self-pay

## 2022-07-17 ENCOUNTER — Inpatient Hospital Stay: Payer: Medicare (Managed Care)

## 2022-07-17 ENCOUNTER — Other Ambulatory Visit: Payer: Self-pay

## 2022-07-17 VITALS — BP 140/79 | HR 77 | Resp 18

## 2022-07-17 DIAGNOSIS — Z95828 Presence of other vascular implants and grafts: Secondary | ICD-10-CM

## 2022-07-17 DIAGNOSIS — C221 Intrahepatic bile duct carcinoma: Secondary | ICD-10-CM

## 2022-07-17 DIAGNOSIS — Z5112 Encounter for antineoplastic immunotherapy: Secondary | ICD-10-CM | POA: Diagnosis not present

## 2022-07-17 MED ORDER — PEGFILGRASTIM-CBQV 6 MG/0.6ML ~~LOC~~ SOSY
6.0000 mg | PREFILLED_SYRINGE | Freq: Once | SUBCUTANEOUS | Status: AC
  Administered 2022-07-17: 6 mg via SUBCUTANEOUS
  Filled 2022-07-17: qty 0.6

## 2022-07-17 MED ORDER — SODIUM CHLORIDE 0.9% FLUSH
10.0000 mL | Freq: Once | INTRAVENOUS | Status: AC
  Administered 2022-07-17: 10 mL

## 2022-07-17 MED ORDER — HEPARIN SOD (PORK) LOCK FLUSH 100 UNIT/ML IV SOLN: 500.0000 [IU] | Freq: Once | INTRAVENOUS | Status: DC

## 2022-07-21 ENCOUNTER — Ambulatory Visit
Admission: RE | Admit: 2022-07-21 | Discharge: 2022-07-21 | Disposition: A | Discharge status: Home / Self Care | Payer: Medicare (Managed Care) | Source: Ambulatory Visit | Attending: Internal Medicine | Admitting: Internal Medicine

## 2022-07-21 ENCOUNTER — Other Ambulatory Visit: Payer: Self-pay | Admitting: Internal Medicine

## 2022-07-21 DIAGNOSIS — R0602 Shortness of breath: Secondary | ICD-10-CM

## 2022-07-25 NOTE — Progress Notes (Unsigned)
[facility] OFFICE PROGRESS NOTE  Lindsay Ok, MD [address] [place] [address]  DIAGNOSIS:  f/u of metastatic colon cancer   Oncology History  metastatic colon cancer to liver  06/20/2019 Procedure   Colonoscopy by Dr Meridee Score 06/20/19  IMPRESSION -Seven 3 to 10 mm polyps in the sigmoid colon, in the transverse colon and in the escending colon, removed with a cold snare. Resected and retrireved.  -One 20mm polyp in the descending colon. Biopsies. Tattoes.  -Mediaum sized lipoma in the ascending colon.   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 See Comment B. Colon, Ascending, polyp, Polypectomy:  -TUBULAR ADENOMA -No high grade dysplasia or malignancy.  C. Colon, TRansverse, Polyo, polectomy:  -TUBULAR ADENOMA -No high grade dysplasia or malignancy.  D. Colon, Sigmoid, Polyp, Polypectomy:  -HYPERPLASTIC POLYP   11/07/2019 Imaging   US Abdomen 11/07/19  IMPRESSION: 1. Two solid masses in the liver are nonspecific. Recommend MRI abdomen with and without contrast for further evaluation.   12/15/2019 Imaging   MRI Abdomen 12/15/19  IMPRESSION: 1. There are two large masses in the liver with appearance favoring metastatic disease or hepatocellular carcinoma or cholangiocarcinoma. A benign etiology is highly unlikely given the enhancement pattern and associated adenopathy. 2. Considerable porta hepatis and retroperitoneal adenopathy. Some of the confluent porta hepatis tumor is potentially infiltrative and abuts the pancreatic body along its right upper margin, making it difficult to completely exclude the possibility of pancreatic adenocarcinoma primary. Possibilities helpful in further workup might include tissue diagnosis, endoscopic ultrasound, or nuclear medicine PET-CT. 3. Pancreas divisum. 4. Lumbar spondylosis and degenerative disc disease. 5. Despite efforts by  the technologist and patient, motion artifact is present on today's exam and could not be eliminated. This reduces exam sensitivity and specificity.   12/22/2019 Procedure   Upper Endoscopy by Dr Elnoria Howard 12/22/19  IMPRESSION - One lymph node was visualized and measured in the porta hepatis region. Fine needle aspiration performed    12/22/2019 Initial Biopsy   A. LIVER, PORTA HEPATIS MASS, FINE NEEDLE 12/22/19 ASPIRATION:  FINAL MICROSCOPIC DIAGNOSIS:  - Malignant cells consistent with metastatic adenocarcinoma   01/01/2020 Initial Diagnosis   Intrahepatic cholangiocarcinoma (HCC)   01/08/2020 Initial Biopsy   FINAL MICROSCOPIC DIAGNOSIS:   A. LIVER, LEFT LOBE, BIOPSY:  - Metastatic adenocarcinoma, consistent with a colorectal primary.  See  comment      COMMENT:   Immunohistochemical stains show the tumor cells are positive for CK20  and CDX2 but negative for CK7, consistent with above interpretation.  Dr. Mosetta Putt was notified on 01/10/2020   01/08/2020 Genetic Testing   [name]  [name] wildtype and [name]/[name] mutations which make her eligible for target biological agent Vectibix.   01/24/2020 Procedure   PAC placed 01/24/20   01/25/2020 -  Chemotherapy   first line FOLFOX starting 01/25/2020, Vextibix  added with C2 (02/06/20)   02/06/2020 - 02/06/2022 Chemotherapy   Patient is on Treatment Plan : COLORECTAL Panitumumab q14d ([name] Wild - Type Gene Only)     06/20/2020 Imaging   CT CAP  IMPRESSION: 1. Continued interval reduction in size and conspicuity of a subsolid nodule of the peripheral left upper lobe. 2. Unchanged prominent pretracheal and subcarinal lymph nodes. 3. Redemonstrated partially calcified low-attenuation liver masses, slightly decreased in size compared to prior examination. 4. Slight interval decrease in size of a portacaval lymph node or conglomerate and retroperitoneal lymph nodes. 5. Findings are consistent  with continued treatment response  of nodal, pulmonary, and hepatic metastatic disease. 6. Coronary artery disease.   Aortic Atherosclerosis (ICD10-I70.0).     10/09/2020 Imaging   IMPRESSION: 1. Slight decrease in size of dominant liver mass with smaller liver mass within 1-2 mm of prior measurement. 2. Stable appearance of celiac lymph and retroperitoneal lymph nodes. Dominant node with calcification in the gastrohepatic ligament as described. 3. Continued decrease in size of LEFT upper lobe nodule. 4. Three-vessel coronary artery calcification. 5. Aortic atherosclerosis.   03/25/2022 -  Chemotherapy   Patient is on Treatment Plan : COLORECTAL FOLFIRI + Bevacizumab q14d       CURRENT THERAPY: FOLFIRI/Beva q14 days, started 03/25/22   INTERVAL HISTORY: Lindsay [name] 67 y.o. female returns to the clinic today for a follow-up visit.  The patient last saw Dr. Mosetta Putt 2 weeks ago.  She is currently undergoing treatment with FOLFIRI plus bevacizumab.  She tolerates this well overall and denies changes in her health since last being seen. Specifically, she denies any fever or chills. She lost about 3 lbs but reports a strong appetite. Denies any chest pain, cough, or hemoptysis. At baseline, she sometimes has DOE and needs to rest, but she states this is unchanged. Denies any abdominal pain.  Denies any nausea, vomiting, or constipation.  She reports that she has diarrhea "on some days" but it is "not as bad as it used to" and she does not need to take any of her Imodium and Lomotil in the interval since last being seen.  She denies any blood in the stool. Denies any abnormal bleeding or bruising.  She has some baseline peripheral neuropathy from her prior chemotherapy which is overall tolerable and manageable.  She states she is able to do her activities of daily living without any restrictions. She has some skin darkening in her hands and legs but denies any pain or itching.  She is here today for evaluation and repeat blood work  before undergoing her next cycle of treatment with cycle #10  MEDICAL HISTORY: Past Medical History:  Diagnosis Date   Arthritis    Diabetes mellitus without complication (HCC)    Hypertension    met colon ca to liver 01/2020    ALLERGIES:  is allergic to oxaliplatin.  MEDICATIONS:  Current Outpatient Medications  Medication Sig Dispense Refill   aspirin EC 81 MG tablet Take 1 tablet (81 mg total) by mouth daily. 90 tablet 3   atorvastatin (LIPITOR) 20 MG tablet Take 20 mg by mouth at bedtime.   1   clindamycin (CLINDAGEL) 1 % gel Apply topically 2 (two) times daily. 60 g 2   clobetasol cream (TEMOVATE) 0.05 % Apply 1 application topically daily.     hydrochlorothiazide (MICROZIDE) 12.5 MG capsule Take 12.5 mg by mouth daily.     KLOR-CON M20 20 MEQ tablet TAKE 1 TABLET TWICE A DAY 180 tablet 3   lidocaine-prilocaine (EMLA) cream Apply 1 application topically as needed. 30 g 1   magic mouthwash w/lidocaine SOLN Take 5 mLs by mouth 4 (four) times daily. 475 mL 2   MAGNESIUM-OXIDE 400 (240 Mg) MG tablet TAKE 2 TABLETS BY MOUTH THREE TIMES DAILY 180 tablet 0   MAGNESIUM-OXIDE 400 (240 Mg) MG tablet TAKE 2 TABLETS BY MOUTH 4 TIMES DAILY 240 tablet 0   omeprazole (PRILOSEC) 20 MG capsule Take 1 capsule (20 mg total) by mouth daily. (Patient not taking: Reported on 06/03/2022) 30 capsule 2   ondansetron (ZOFRAN) 8  MG tablet TAKE 1 TABLET BY MOUTH EVERY 8 HOURS AS NEEDED FOR NAUSEA OR VOMITING 20 tablet 0   prochlorperazine (COMPAZINE) 10 MG tablet Take 1 tablet (10 mg total) by mouth every 6 (six) hours as needed for nausea or vomiting. 30 tablet 2   spironolactone (ALDACTONE) 25 MG tablet Take 25 mg by mouth daily with breakfast.     urea (CARMOL) 10 % cream Apply topically 2 (two) times daily. 71 g 2   valACYclovir (VALTREX) 1000 MG tablet Take 1 tablet (1,000 mg total) by mouth 2 (two) times daily. 10 tablet 0   verapamil (CALAN) 120 MG tablet Take 120 mg by mouth 3 (three) times daily.      No current facility-administered medications for this visit.    SURGICAL HISTORY:  Past Surgical History:  Procedure Laterality Date   ABDOMINAL HYSTERECTOMY     ANTERIOR AND POSTERIOR REPAIR WITH SACROSPINOUS FIXATION N/A 07/16/2016   Procedure: ANTERIOR AND POSTERIOR REPAIR WITH SACROSPINOUS FIXATION;  Surgeon: Harold Hedge, MD;  Location: [facility];  Service: Gynecology;  Laterality: N/A;   CYSTOSCOPY  07/16/2016   Procedure: CYSTOSCOPY;  Surgeon: Harold Hedge, MD;  Location: [facility];  Service: Gynecology;;   ESOPHAGOGASTRODUODENOSCOPY (EGD) WITH PROPOFOL N/A 12/22/2019   Procedure: ESOPHAGOGASTRODUODENOSCOPY (EGD) WITH PROPOFOL;  Surgeon: Jeani Hawking, MD;  Location: [facility];  Service: Endoscopy;  Laterality: N/A;   FINE NEEDLE ASPIRATION N/A 12/22/2019   Procedure: FINE NEEDLE ASPIRATION (FNA) LINEAR;  Surgeon: Jeani Hawking, MD;  Location: [facility];  Service: Endoscopy;  Laterality: N/A;   IR IMAGING GUIDED PORT INSERTION  01/24/2020   LAPAROSCOPIC VAGINAL HYSTERECTOMY WITH SALPINGECTOMY Bilateral 07/16/2016   Procedure: LAPAROSCOPIC ASSISTED VAGINAL HYSTERECTOMY WITH SALPINGECTOMY;  Surgeon: Harold Hedge, MD;  Location: [facility];  Service: Gynecology;  Laterality: Bilateral;   MYOMECTOMY ABDOMINAL APPROACH     THYROID SURGERY     tyroid     UPPER ESOPHAGEAL ENDOSCOPIC ULTRASOUND (EUS) N/A 12/22/2019   Procedure: UPPER ESOPHAGEAL ENDOSCOPIC ULTRASOUND (EUS);  Surgeon: Jeani Hawking, MD;  Location: [facility];  Service: Endoscopy;  Laterality: N/A;    REVIEW OF SYSTEMS:   Review of Systems  Constitutional: Negative for appetite change, chills, fatigue, fever and unexpected weight change.  HENT:   Negative for mouth sores, nosebleeds, sore throat and trouble swallowing.   Eyes: Negative for eye problems and icterus.  Respiratory: Positive for stable intermittent dyspnea on exertion.  Negative for cough, hemoptysis, and wheezing.   Cardiovascular: Negative for chest  pain and leg swelling.  Gastrointestinal: Positive for intermittent mild diarrhea.  Negative for abdominal pain, constipation, nausea and vomiting.  Genitourinary: Negative for bladder incontinence, difficulty urinating, dysuria, frequency and hematuria.   Musculoskeletal: Negative for back pain, gait problem, neck pain and neck stiffness.  Skin: Positive for skin darkening.  Negative for itching. Neurological: Positive for stable peripheral neuropathy.  Negative for dizziness, extremity weakness, gait problem, headaches, light-headedness and seizures.  Hematological: Negative for adenopathy. Does not bruise/bleed easily.  Psychiatric/Behavioral: Negative for confusion, depression and sleep disturbance. The patient is not nervous/anxious.     PHYSICAL EXAMINATION:  There were no vitals taken for this visit.  ECOG PERFORMANCE STATUS: 1  Physical Exam  Constitutional: Oriented to person, place, and time and well-developed, well-nourished, and in no distress.  HENT:  Head: Normocephalic and atraumatic.  Mouth/Throat: Oropharynx is clear and moist. No oropharyngeal exudate.  Eyes: Conjunctivae are normal. Right eye exhibits no discharge. Left eye exhibits no discharge. No scleral icterus.  Neck:  Normal range of motion. Neck supple.  Cardiovascular: Normal rate, regular rhythm, normal heart sounds and intact distal pulses.   Pulmonary/Chest: Effort normal and breath sounds normal. No respiratory distress. No wheezes. No rales.  Abdominal: Soft. Bowel sounds are normal. Exhibits no distension and no mass. There is no tenderness.  Musculoskeletal: Normal range of motion. Exhibits no edema.  Lymphadenopathy:    No cervical adenopathy.  Neurological: Alert and oriented to person, place, and time. Exhibits normal muscle tone. Gait normal. Coordination normal.  Skin: Skin is warm and dry.  Positive for skin darkening on hands and legs bilaterally.  Not diaphoretic. No erythema. No pallor.   Psychiatric: Mood, memory and judgment normal.  Vitals reviewed.  LABORATORY DATA: Lab Results  Component Value Date   WBC 9.9 07/15/2022   HGB 11.3 (L) 07/15/2022   HCT 35.3 (L) 07/15/2022   MCV 93.4 07/15/2022   PLT 215 07/15/2022      Chemistry      Component Value Date/Time   NA 138 07/15/2022 0848   K 4.0 07/15/2022 0848   CL 105 07/15/2022 0848   CO2 29 07/15/2022 0848   BUN 13 07/15/2022 0848   CREATININE 0.65 07/15/2022 0848      Component Value Date/Time   CALCIUM 9.4 07/15/2022 0848   ALKPHOS 104 07/15/2022 0848   AST 15 07/15/2022 0848   ALT 21 07/15/2022 0848   BILITOT 0.2 (L) 07/15/2022 0848       RADIOGRAPHIC STUDIES:  No results found.   ASSESSMENT/PLAN:  Lindsay [name] is a 67 y.o. female with    Peripheral neuropathy due to chemotherapy [facility]) -started after cycle 5, oxaliplatin dose reduced and eventually discontinued after reaction 09/18/20 -Persistent numbness at the feet and hands, mild overall and this does not interfere with her activities of daily living.   metastatic colon 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 Liver biopsy 01/08/20 confirmed metastasis from primary colorectal cancer. PET scan showed hypermetabolism to known liver mets, diffuse thoracic and abdominal lymphadenopathy, a 1.8 cm LUL pulmonary nodule, and splenic flexure of colon. -treated with first line FOLFOX 01/25/20 - 10/02/20, Vectibix added with C2. Oxali discontinued after C18 due to reaction. -switched to Xeloda 10/21/20, dose adjusted due to significant skin toxicity -due to cancer progression on recent staging scan, treatment has been changed to FOLFIRI and bevacizumab on 03/25/22, she is overall tolerating well -Restaging CT scan from 06/16/2022 showed stable to mild decrease in size of treated liver metastasis. No new lesions -She is tolerating  chemotherapy well overall, will continue -Lab reviewed, adequate for treatment.  -Next restaging scan in later July or August per Dr. Mosetta Putt last note     PLAN: -lab reviewed -continue with Folfiri/Beva today , labs ar adequate for treatment. -lab/flush and Folfiri/beva in 2 weeks   No orders of the defined types were placed in this encounter.    The total time spent in the appointment was 20-29 minutes  Anneta Rounds L Sammy Cassar, PA-C 07/25/22

## 2022-07-27 MED FILL — Dexamethasone Sodium Phosphate Inj 100 MG/10ML: INTRAMUSCULAR | Qty: 1 | Status: AC

## 2022-07-28 ENCOUNTER — Inpatient Hospital Stay (HOSPITAL_BASED_OUTPATIENT_CLINIC_OR_DEPARTMENT_OTHER): Payer: Medicare (Managed Care) | Admitting: Physician Assistant

## 2022-07-28 ENCOUNTER — Other Ambulatory Visit: Payer: Self-pay

## 2022-07-28 ENCOUNTER — Inpatient Hospital Stay: Payer: Medicare (Managed Care)

## 2022-07-28 VITALS — BP 125/71 | HR 76 | Resp 18

## 2022-07-28 DIAGNOSIS — K1379 Other lesions of oral mucosa: Secondary | ICD-10-CM

## 2022-07-28 DIAGNOSIS — C221 Intrahepatic bile duct carcinoma: Secondary | ICD-10-CM | POA: Diagnosis not present

## 2022-07-28 DIAGNOSIS — C189 Malignant neoplasm of colon, unspecified: Secondary | ICD-10-CM

## 2022-07-28 DIAGNOSIS — Z95828 Presence of other vascular implants and grafts: Secondary | ICD-10-CM

## 2022-07-28 DIAGNOSIS — Z5112 Encounter for antineoplastic immunotherapy: Secondary | ICD-10-CM | POA: Diagnosis not present

## 2022-07-28 LAB — CMP (CANCER CENTER ONLY)
ALT: 17 U/L (ref 0–44)
AST: 14 U/L — ABNORMAL LOW (ref 15–41)
Albumin: 4.2 g/dL (ref 3.5–5.0)
Alkaline Phosphatase: 118 U/L (ref 38–126)
Anion gap: 7 (ref 5–15)
BUN: 14 mg/dL (ref 8–23)
CO2: 29 mmol/L (ref 22–32)
Calcium: 9.8 mg/dL (ref 8.9–10.3)
Chloride: 103 mmol/L (ref 98–111)
Creatinine: 0.66 mg/dL (ref 0.44–1.00)
GFR, Estimated: >60 mL/min (ref >60)
Glucose, Bld: 119 mg/dL — ABNORMAL HIGH (ref 70–99)
Potassium: 4.1 mmol/L (ref 3.5–5.1)
Sodium: 139 mmol/L (ref 135–145)
Total Bilirubin: 0.3 mg/dL (ref 0.3–1.2)
Total Protein: 7.6 g/dL (ref 6.5–8.1)

## 2022-07-28 LAB — CEA (ACCESS): CEA (CHCC): 2.62 ng/mL (ref 0.00–5.00)

## 2022-07-28 LAB — CBC WITH DIFFERENTIAL (CANCER CENTER ONLY)
Abs Immature Granulocytes: 0.11 10*3/uL — ABNORMAL HIGH (ref 0.00–0.07)
Basophils Absolute: 0 10*3/uL (ref 0.0–0.1)
Basophils Relative: 0 %
Eosinophils Absolute: 0.2 10*3/uL (ref 0.0–0.5)
Eosinophils Relative: 2 %
HCT: 37.5 % (ref 36.0–46.0)
Hemoglobin: 12.1 g/dL (ref 12.0–15.0)
Immature Granulocytes: 1 %
Lymphocytes Relative: 15 %
Lymphs Abs: 1.6 10*3/uL (ref 0.7–4.0)
MCH: 29.9 pg (ref 26.0–34.0)
MCHC: 32.3 g/dL (ref 30.0–36.0)
MCV: 92.6 fL (ref 80.0–100.0)
Monocytes Absolute: 0.6 10*3/uL (ref 0.1–1.0)
Monocytes Relative: 6 %
Neutro Abs: 7.8 10*3/uL — ABNORMAL HIGH (ref 1.7–7.7)
Neutrophils Relative %: 76 %
Platelet Count: 262 10*3/uL (ref 150–400)
RBC: 4.05 MIL/uL (ref 3.87–5.11)
RDW: 17.5 % — ABNORMAL HIGH (ref 11.5–15.5)
WBC Count: 10.3 10*3/uL (ref 4.0–10.5)
nRBC: 0 % (ref 0.0–0.2)

## 2022-07-28 LAB — TOTAL PROTEIN, URINE DIPSTICK: Protein, ur: NEGATIVE mg/dL

## 2022-07-28 LAB — MAGNESIUM: Magnesium: 1.8 mg/dL (ref 1.7–2.4)

## 2022-07-28 MED ORDER — MAGIC MOUTHWASH W/LIDOCAINE
5.0000 mL | Freq: Four times a day (QID) | ORAL | 2 refills | Status: DC
Start: 2022-07-28 — End: 2023-05-05

## 2022-07-28 MED ORDER — PALONOSETRON HCL INJECTION 0.25 MG/5ML
0.2500 mg | Freq: Once | INTRAVENOUS | Status: AC
  Administered 2022-07-28: 0.25 mg via INTRAVENOUS
  Filled 2022-07-28: qty 5

## 2022-07-28 MED ORDER — SODIUM CHLORIDE 0.9 % IV SOLN
400.0000 mg/m2 | Freq: Once | INTRAVENOUS | Status: AC
  Administered 2022-07-28: 780 mg via INTRAVENOUS
  Filled 2022-07-28: qty 39

## 2022-07-28 MED ORDER — ATROPINE SULFATE 1 MG/ML IV SOLN
0.5000 mg | Freq: Once | INTRAVENOUS | Status: AC | PRN
  Administered 2022-07-28: 0.5 mg via INTRAVENOUS
  Filled 2022-07-28: qty 1

## 2022-07-28 MED ORDER — SODIUM CHLORIDE 0.9 % IV SOLN
10.0000 mg | Freq: Once | INTRAVENOUS | Status: AC
  Administered 2022-07-28: 10 mg via INTRAVENOUS
  Filled 2022-07-28: qty 10

## 2022-07-28 MED ORDER — SODIUM CHLORIDE 0.9 % IV SOLN
5.0000 mg/kg | Freq: Once | INTRAVENOUS | Status: AC
  Administered 2022-07-28: 400 mg via INTRAVENOUS
  Filled 2022-07-28: qty 16

## 2022-07-28 MED ORDER — SODIUM CHLORIDE 0.9 % IV SOLN: Freq: Once | INTRAVENOUS | Status: AC

## 2022-07-28 MED ORDER — LIDOCAINE-PRILOCAINE 2.5-2.5 % EX CREA
1.0000 | TOPICAL_CREAM | CUTANEOUS | 1 refills | Status: DC | PRN
Start: 2022-07-28 — End: 2023-05-05

## 2022-07-28 MED ORDER — SODIUM CHLORIDE 0.9% FLUSH: 10.0000 mL | INTRAVENOUS | Status: DC | PRN

## 2022-07-28 MED ORDER — SODIUM CHLORIDE 0.9 % IV SOLN
180.0000 mg/m2 | Freq: Once | INTRAVENOUS | Status: AC
  Administered 2022-07-28: 360 mg via INTRAVENOUS
  Filled 2022-07-28: qty 18

## 2022-07-28 MED ORDER — SODIUM CHLORIDE 0.9% FLUSH
10.0000 mL | Freq: Once | INTRAVENOUS | Status: AC
  Administered 2022-07-28: 10 mL

## 2022-07-28 MED ORDER — ONDANSETRON HCL 8 MG PO TABS: 8.0000 mg | ORAL_TABLET | Freq: Three times a day (TID) | ORAL | 0 refills | Status: DC | PRN

## 2022-07-28 MED ORDER — SODIUM CHLORIDE 0.9 % IV SOLN
2400.0000 mg/m2 | INTRAVENOUS | Status: DC
  Administered 2022-07-28: 5000 mg via INTRAVENOUS
  Filled 2022-07-28: qty 100

## 2022-07-28 NOTE — Patient Instructions (Signed)
[facility]  Discharge Instructions: Thank you for choosing [facility] to provide your oncology and hematology care.   If you have a lab appointment with the Cancer Center, please go directly to the Cancer Center and check in at the registration area.   Wear comfortable clothing and clothing appropriate for easy access to any Portacath or PICC line.   We strive to give you quality time with your provider. You may need to reschedule your appointment if you arrive late (15 or more minutes).  Arriving late affects you and other patients whose appointments are after yours.  Also, if you miss three or more appointments without notifying the office, you may be dismissed from the clinic at the provider's discretion.      For prescription refill requests, have your pharmacy contact our office and allow 72 hours for refills to be completed.    Today you received the following chemotherapy and/or immunotherapy agents irinotecan, bevacizumab, fluorourcil      To help prevent nausea and vomiting after your treatment, we encourage you to take your nausea medication as directed.  BELOW ARE SYMPTOMS THAT SHOULD BE REPORTED IMMEDIATELY: *FEVER GREATER THAN 100.4 F (38 C) OR HIGHER *CHILLS OR SWEATING *NAUSEA AND VOMITING THAT IS NOT CONTROLLED WITH YOUR NAUSEA MEDICATION *UNUSUAL SHORTNESS OF BREATH *UNUSUAL BRUISING OR BLEEDING *URINARY PROBLEMS (pain or burning when urinating, or frequent urination) *BOWEL PROBLEMS (unusual diarrhea, constipation, pain near the anus) TENDERNESS IN MOUTH AND THROAT WITH OR WITHOUT PRESENCE OF ULCERS (sore throat, sores in mouth, or a toothache) UNUSUAL RASH, SWELLING OR PAIN  UNUSUAL VAGINAL DISCHARGE OR ITCHING   Items with * indicate a potential emergency and should be followed up as soon as possible or go to the Emergency Department if any problems should occur.  Please show the CHEMOTHERAPY ALERT CARD or  IMMUNOTHERAPY ALERT CARD at check-in to the Emergency Department and triage nurse.  Should you have questions after your visit or need to cancel or reschedule your appointment, please contact [facility]  Dept: [phone]  and follow the prompts.  Office hours are 8:00 a.m. to 4:30 p.m. Monday - Friday. Please note that voicemails left after 4:00 p.m. may not be returned until the following business day.  We are closed weekends and major holidays. You have access to a nurse at all times for urgent questions. Please call the main number to the clinic Dept: [phone] and follow the prompts.   For any non-urgent questions, you may also contact your provider using MyChart. We now offer e-Visits for anyone 18 and older to request care online for non-urgent symptoms. For details visit [email or web address].   Also download the MyChart app! Go to the app store, search "MyChart", open the app, select [facility], and log in with your MyChart username and password.   

## 2022-07-30 ENCOUNTER — Inpatient Hospital Stay: Payer: Medicare (Managed Care)

## 2022-07-30 VITALS — BP 118/73 | HR 63 | Temp 98.6°F | Resp 16

## 2022-07-30 DIAGNOSIS — Z5112 Encounter for antineoplastic immunotherapy: Secondary | ICD-10-CM | POA: Diagnosis not present

## 2022-07-30 DIAGNOSIS — C221 Intrahepatic bile duct carcinoma: Secondary | ICD-10-CM

## 2022-07-30 MED ORDER — SODIUM CHLORIDE 0.9% FLUSH
10.0000 mL | INTRAVENOUS | Status: DC | PRN
  Administered 2022-07-30: 10 mL

## 2022-07-30 MED ORDER — PEGFILGRASTIM-CBQV 6 MG/0.6ML ~~LOC~~ SOSY: 6.0000 mg | PREFILLED_SYRINGE | Freq: Once | SUBCUTANEOUS | Status: DC

## 2022-07-30 MED ORDER — HEPARIN SOD (PORK) LOCK FLUSH 100 UNIT/ML IV SOLN
500.0000 [IU] | Freq: Once | INTRAVENOUS | Status: AC | PRN
  Administered 2022-07-30: 500 [IU]

## 2022-07-30 NOTE — Addendum Note (Signed)
Addended by: Elwanda Brooklyn on: 07/30/2022 01:05 PM   Modules accepted: Orders

## 2022-07-31 NOTE — Progress Notes (Signed)
Received request from Dr Mosetta Putt to remove ANC restrictions of holding Udenyca if ANC >1.3 from all future plans.  Please administer Udenyca  on day 3 with no restrictions on ANC going forward.  T.O. Dr Audie Clear, PharmD  07/31/22 @ [phone]

## 2022-08-03 ENCOUNTER — Other Ambulatory Visit: Payer: Self-pay | Admitting: Hematology

## 2022-08-03 DIAGNOSIS — C221 Intrahepatic bile duct carcinoma: Secondary | ICD-10-CM

## 2022-08-07 IMAGING — US US BIOPSY CORE LIVER
1 series · 13 of 25 positions shown · non-contrast
Comparison: none

INDICATION: Hepatic masses.

[Series 1: us biopsy core liver · 13 of 28 slices shown]
[im 1/28]
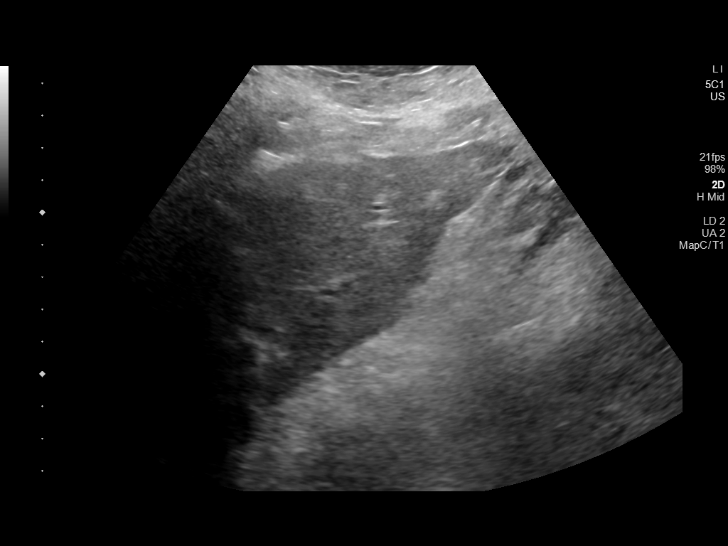
[im 3/28]
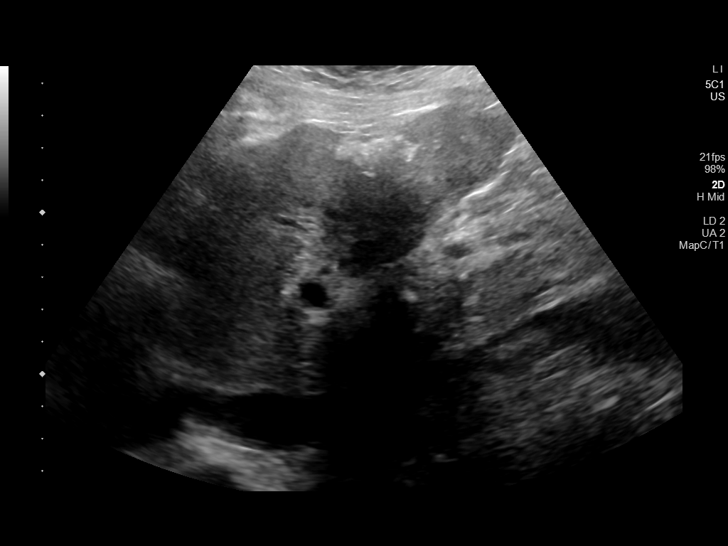
[im 5/28]
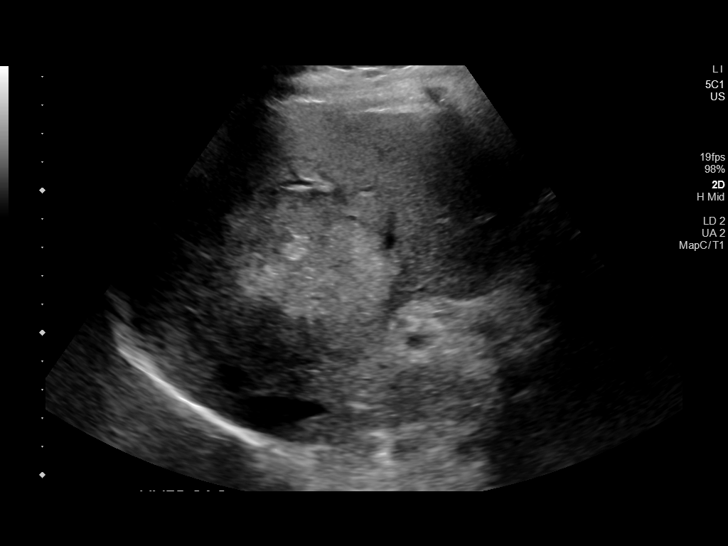
[im 7/28]
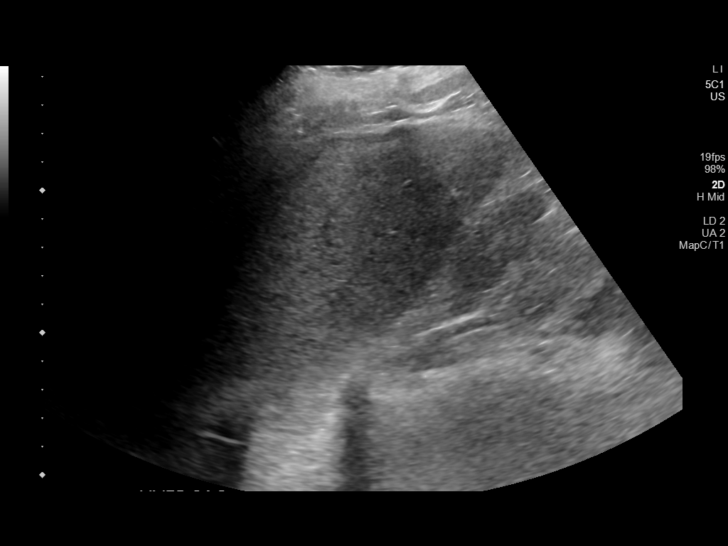
[im 10/28]
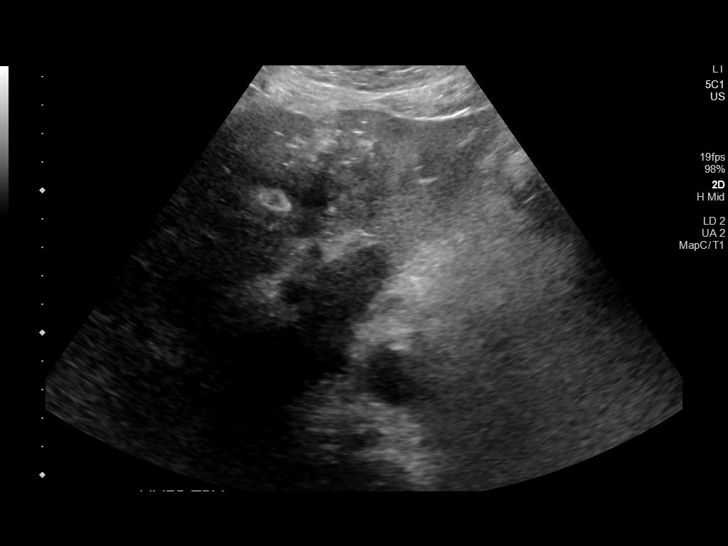
[im 12/28]
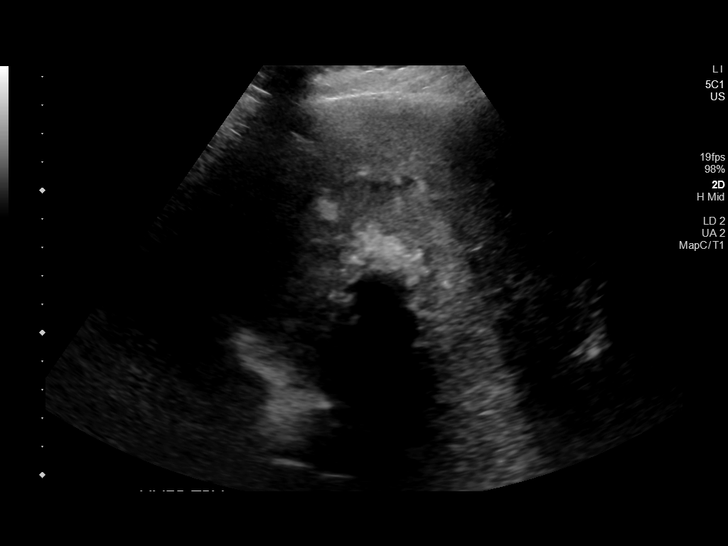
[im 14/28]
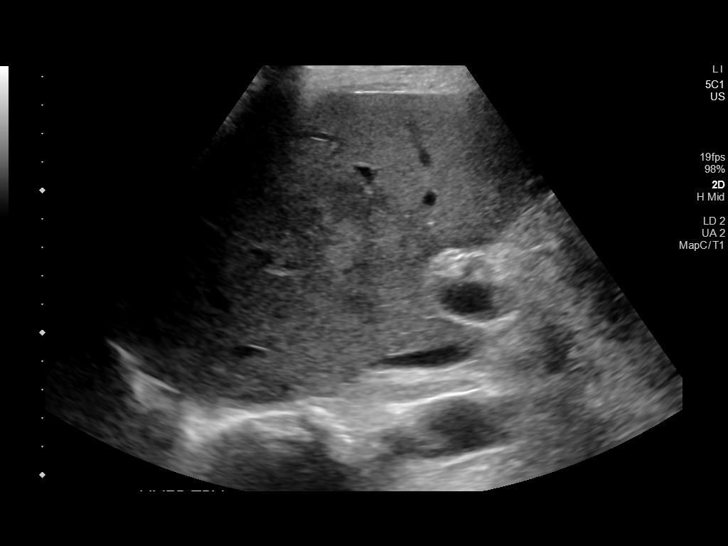
[im 16/28]
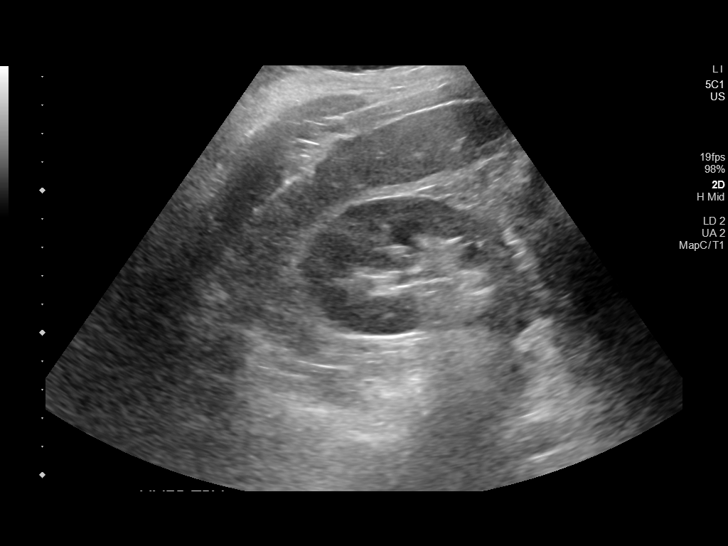
[im 19/28]
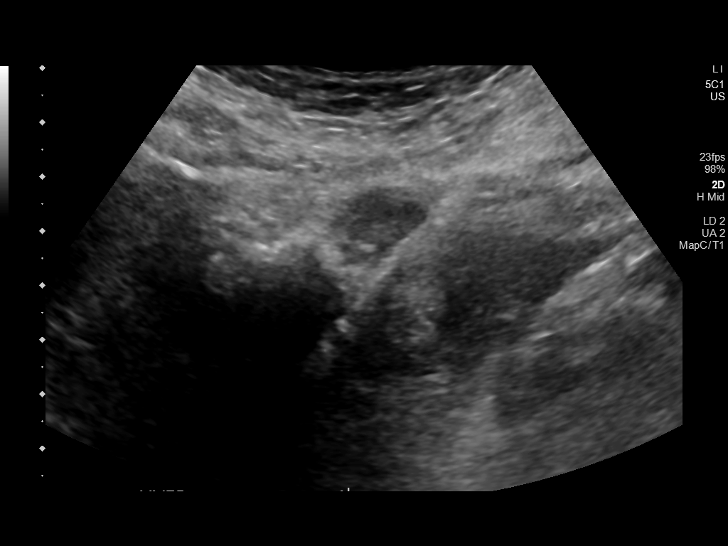
[im 21/28]
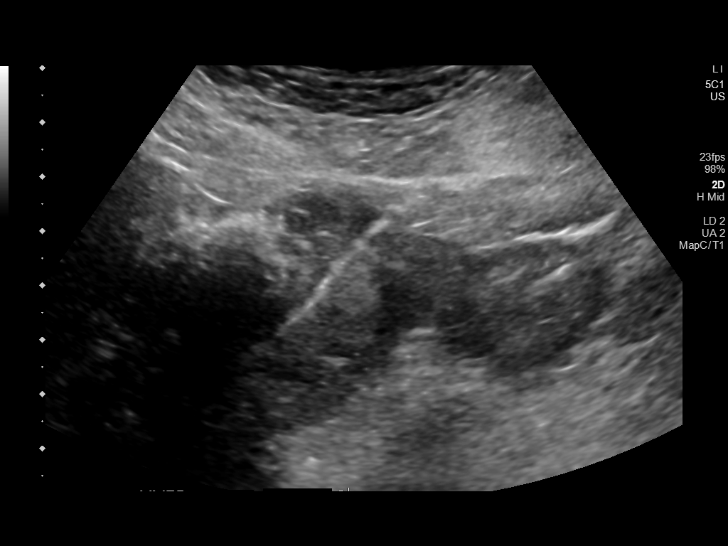
[im 23/28]
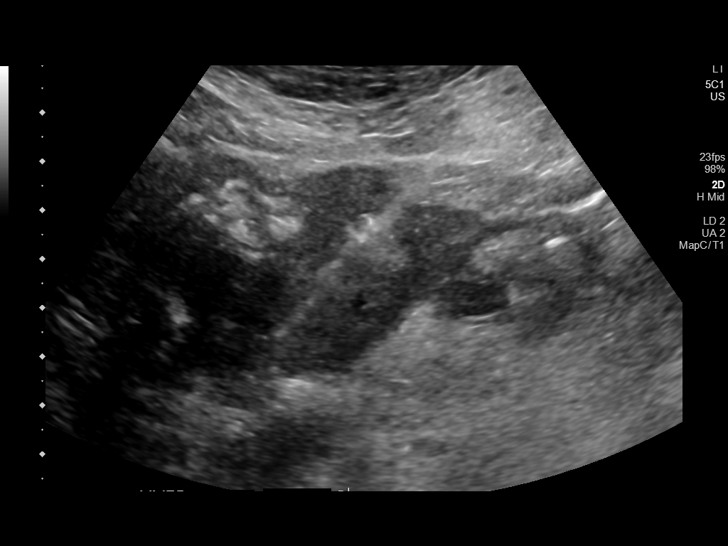
[im 25/28]
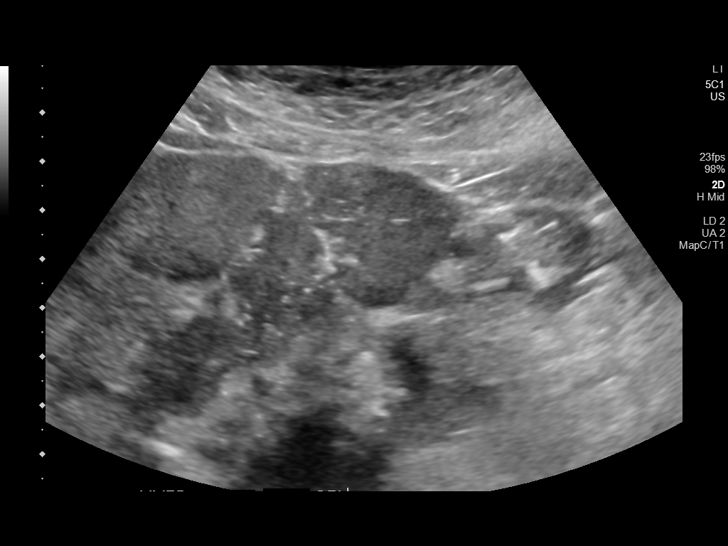
[im 28/28]
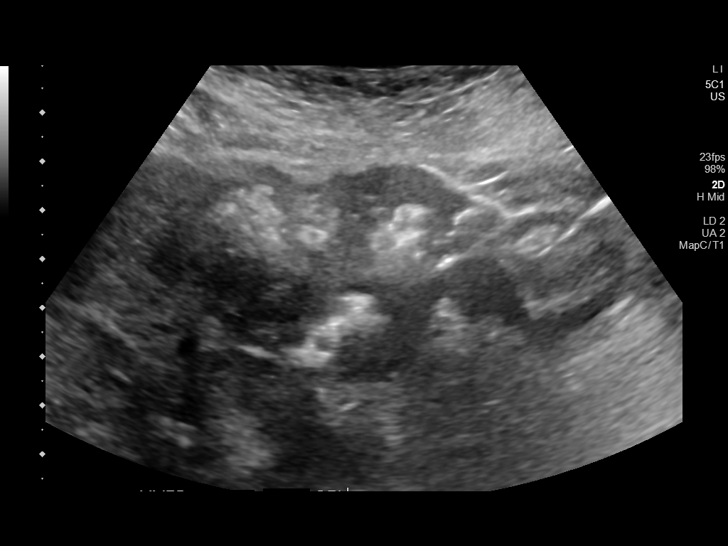

[13 of 25 positions shown; findings below may reference images not displayed]

EXAM:
ULTRASOUND GUIDED CORE BIOPSY OF LIVER MASS

MEDICATIONS:
None.

ANESTHESIA/SEDATION:
Fentanyl 100 mcg IV; Versed 2.0 mg IV

Moderate Sedation Time:  16 minutes.

The patient was continuously monitored during the procedure by the
interventional radiology nurse under my direct supervision.

PROCEDURE:
The procedure, risks, benefits, and alternatives were explained to
the patient. Questions regarding the procedure were encouraged and
answered. The patient understands and consents to the procedure. A
time-out was performed prior to initiating the procedure.

Ultrasound was used to localize hepatic masses. The abdominal wall
was prepped with chlorhexidine in a sterile fashion, and a sterile
drape was applied covering the operative field. A sterile gown and
sterile gloves were used for the procedure. Local anesthesia was
provided with 1% Lidocaine.

Under ultrasound guidance, a 17 gauge trocar needle was advanced to
the level of a left lobe hepatic mass. After confirming needle tip
position, 3 separate coaxial 18 gauge core biopsy samples were
obtained and submitted in formalin. A slurry of Gel-Foam pledgets
mixed in sterile saline was then injected via the outer needle as
the needle was removed. Additional ultrasound was performed.

COMPLICATIONS:
None immediate.
FINDINGS: Large left lobe subcapsular mass again identified measuring up to 7
cm by MRI. This mass appears to be partially calcified by ultrasound
demonstrating some areas of posterior acoustic shadowing. The
central hepatic mass occupying portions of both right and left lobes
was much deeper in location by ultrasound and therefore the left
lobe mass was sampled. Solid tissue was obtained.
IMPRESSION: Ultrasound-guided core biopsy performed of a mass within the left
lobe of the liver.

## 2022-08-11 IMAGING — PT NM PET TUM IMG INITIAL (PI) SKULL BASE T - THIGH
1 series · 11 of 11 positions shown · non-contrast
Comparison: MR abdomen 12/15/2019.

CLINICAL DATA: Subsequent treatment strategy for metastatic
adenocarcinoma.

EXAM:
NUCLEAR MEDICINE PET SKULL BASE TO THIGH
TECHNIQUE: 9.4 mCi F-18 FDG was injected intravenously. Full-ring PET imaging
was performed from the skull base to thigh after the radiotracer. CT
data was obtained and used for attenuation correction and anatomic
localization.
Fasting blood glucose: 115 mg/dl

[Series 1096: results mm oncology reading · 1.0mm · 0.89mm/px · 11 of 11 slices shown]
[im 1/11]
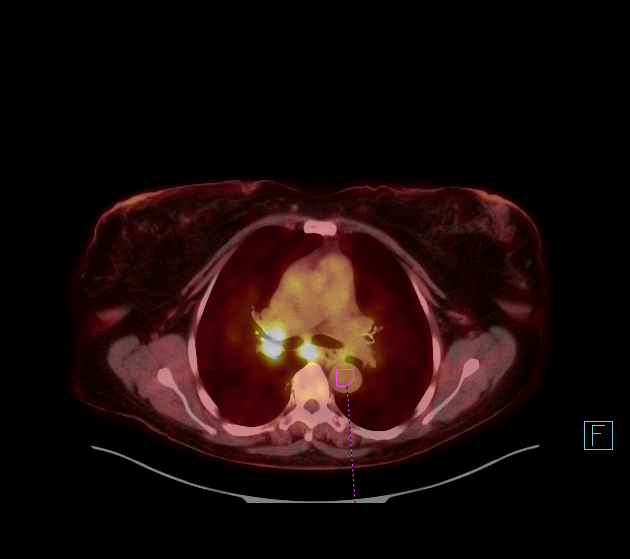
[im 2/11]
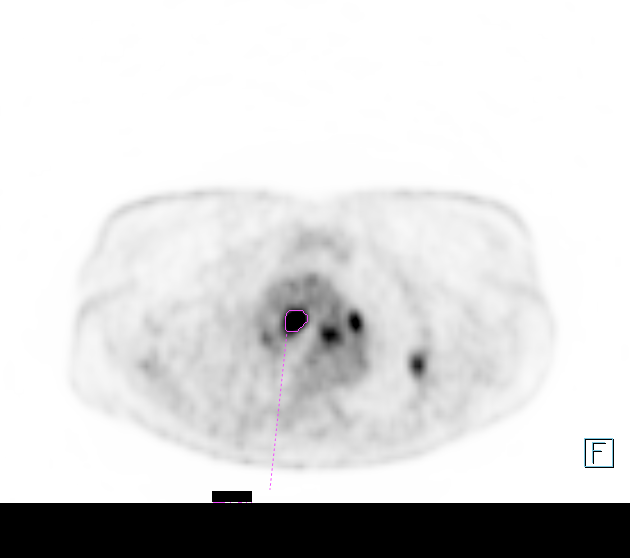
[im 3/11]
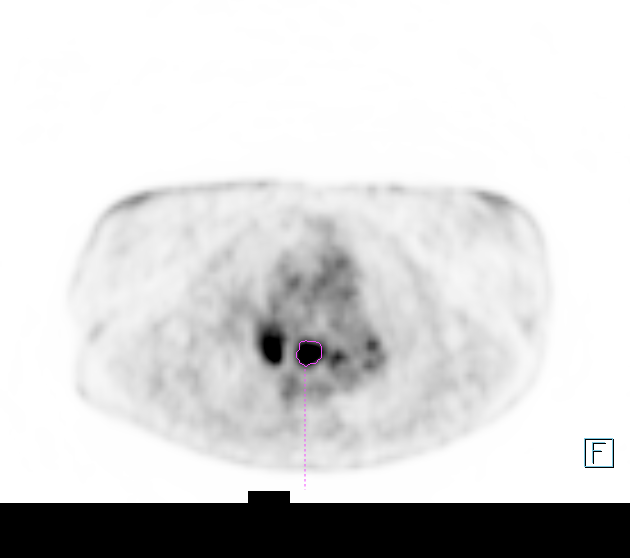
[im 4/11]
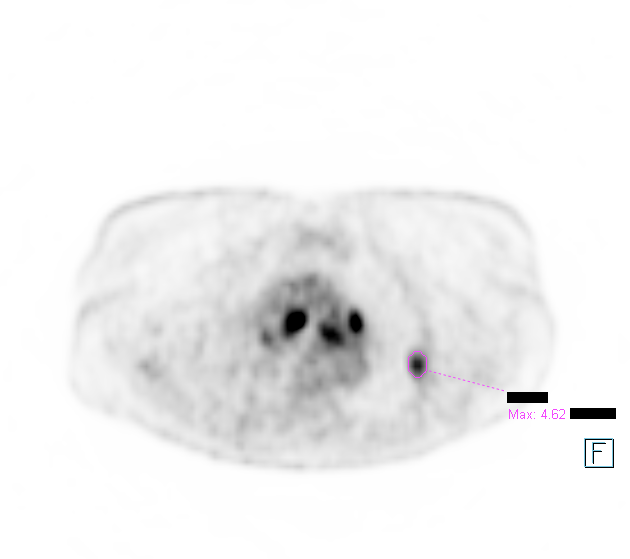
[im 5/11]
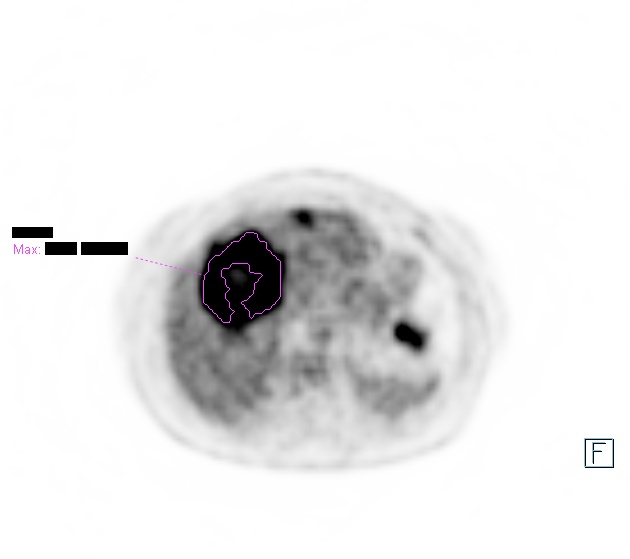
[im 6/11]
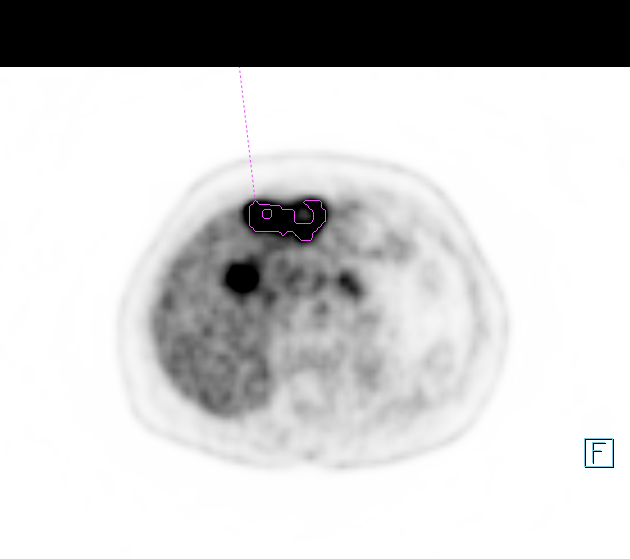
[im 7/11]
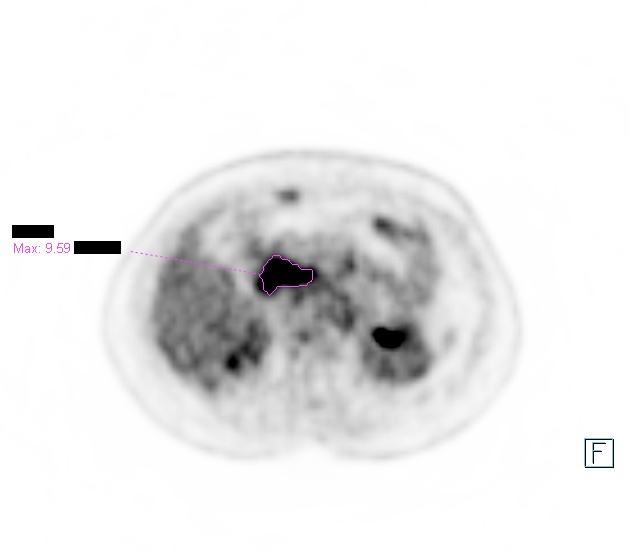
[im 8/11]
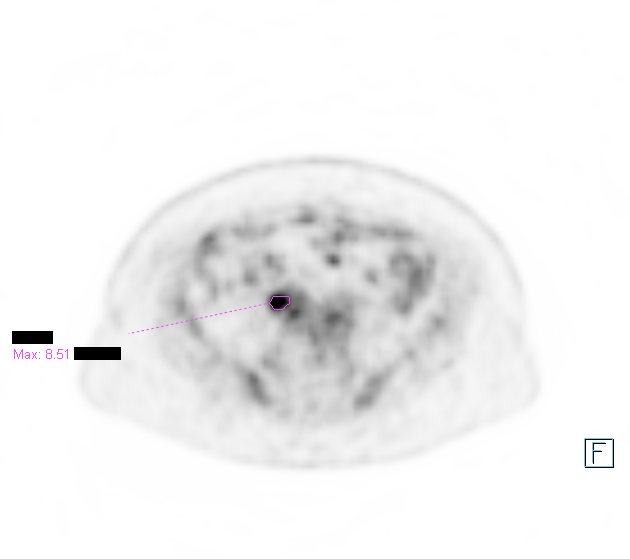
[im 9/11]
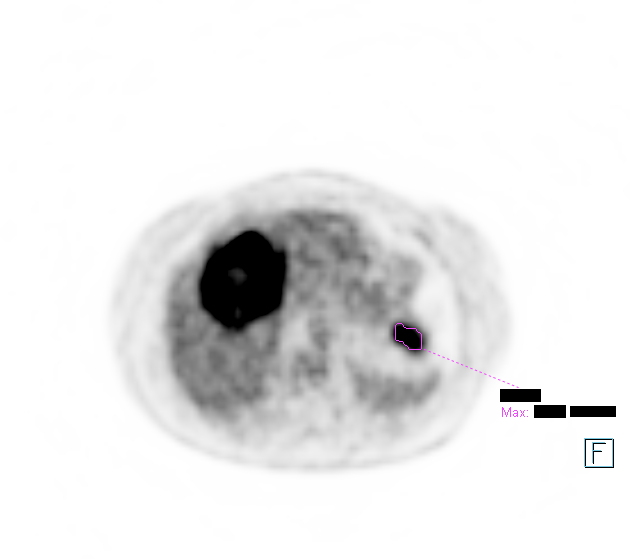
[im 10/11]
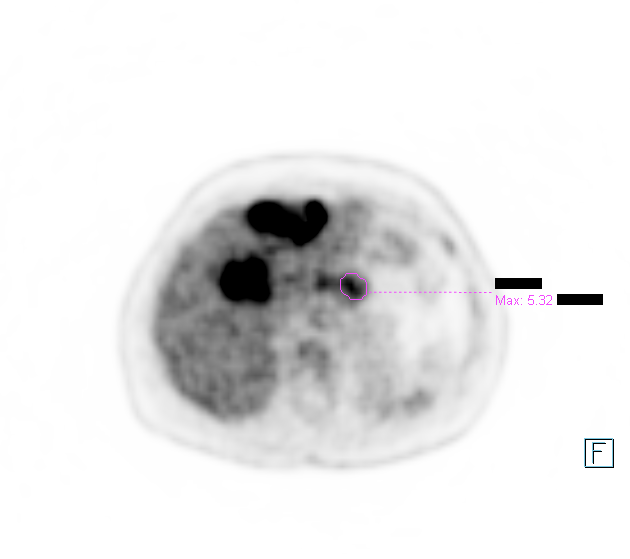
[im 11/11]
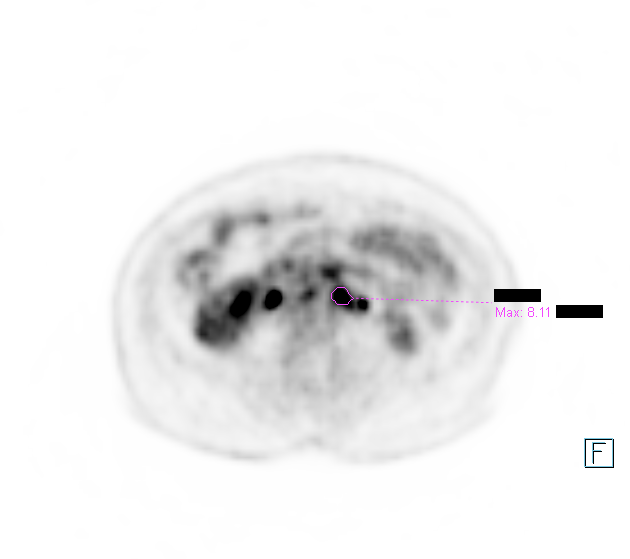

[11 of 11 positions shown; findings below may reference images not displayed]

FINDINGS: Mediastinal blood pool activity: SUV max

Liver activity: SUV max NA

NECK: No hypermetabolic lymph nodes in the neck.

Incidental CT findings: none

CHEST: Hypermetabolic right supraclavicular, mediastinal, and hilar
lymph nodes are consistent with metastatic disease. Index
hypermetabolic right paratracheal node on image 56/series 4 is 18 mm
short axis with SUV max = 9.0. 2.3 cm short axis subcarinal node on
69/4 demonstrates SUV max = 8.8.

1.8 cm left upper lobe pulmonary nodule is hypermetabolic with SUV
max = 4.6.

Incidental CT findings: Coronary artery calcification is evident.
Atherosclerotic calcification is noted in the wall of the thoracic
aorta. Patchy airspace disease is seen in the right middle lobe and
both lower lobes, potentially atelectatic although
infectious/inflammatory etiology is not excluded. Changes are not
substantially hypermetabolic do show low level FDG accumulation.

ABDOMEN/PELVIS: Multiple hypermetabolic liver lesions evident
including 8.4 x 7.0 cm lesion in the dome of the liver on image
92/series 4. This lesion has SUV max = 15.8. 6.8 cm left hepatic
lesion demonstrates SUV max = 13.2.

1.9 cm short axis gastrohepatic ligament lymph node on 100/4
demonstrates SUV max = 5.3.

Bulky lymphadenopathy in the hepatoduodenal ligament is
hypermetabolic with SUV max = 9.6. Retroperitoneal/para-aortic
lymphadenopathy is hypermetabolic with SUV max = 8.1.

14 mm short axis right common iliac node (image 43/4) shows SUV max
= 8.5.

Mild there is relatively diffuse FDG accumulation in the transverse
colon, a focal hypermetabolic region is noted in the splenic
flexure, corresponding to soft tissue density seen on image 93 of
series 4. This is somewhat difficult to assess on axial only imaging
of today's exam with SUV max = 10.3 in this region.

Hypermetabolism noted in multiple adjacent small bowel loops of the
right lower pelvis, presumably physiologic.

Incidental CT findings: There is abdominal aortic atherosclerosis
without aneurysm.

SKELETON: No focal hypermetabolic activity to suggest skeletal
metastasis.

Incidental CT findings: Status post right hip replacement.
Degenerative changes noted left acetabulum
IMPRESSION: 1. Hypermetabolic metastatic lymphadenopathy identified in the right
supraclavicular region, mediastinum, both hilar regions, abdomen,
and right common iliac chain of the pelvis.
2. Patient's known bulky liver disease is hypermetabolic consistent
with metastatic involvement.
3. Focal hypermetabolism in the splenic flexure of the colon with a
question of confluent soft tissue lesion at this location although
assessment is limited by axial imaging today. This raises the
question of a primary site of colorectal neoplasm although not
definite. Patient is noted have relatively diffuse FDG accumulation
in the transverse colon.
4. Hypermetabolic left upper lobe pulmonary nodule consistent with
metastatic disease.
5. Patchy areas of airspace opacity in both lungs with low level FDG
accumulation. Imaging features suggest infectious/inflammatory
etiology.
6.  Aortic Atherosclerois ([id number]-[id number])

## 2022-08-11 MED FILL — Dexamethasone Sodium Phosphate Inj 100 MG/10ML: INTRAMUSCULAR | Qty: 1 | Status: AC

## 2022-08-11 NOTE — Assessment & Plan Note (Addendum)
MSS, [name]/[name] wildtype -diagnosed 12/2019 by porta hepatis LN biopsy during EUS for work up of abdominal pain and liver lesions seen on [place] and MRI. Liver biopsy 01/08/20 confirmed metastasis from primary colorectal cancer. PET scan showed hypermetabolism to known liver mets, diffuse thoracic and abdominal lymphadenopathy, a 1.8 cm LUL pulmonary nodule, and splenic flexure of colon. -treated with first line FOLFOX 01/25/20 - 10/02/20, Vectibix added with C2. Oxali discontinued after C18 due to reaction. -switched to Xeloda 10/21/20, dose adjusted due to significant skin toxicity -due to cancer progression on recent staging scan, treatment has been changed to FOLFIRI and bevacizumab on 03/25/22, she is overall tolerating well -will repeat CT scan after next cycle chemo  -Restaging CT scan from 06/16/2022 showed stable to mild decrease in size of treated liver metastasis. No new lesions -She is tolerating chemotherapy well overall, will continue

## 2022-08-11 NOTE — Assessment & Plan Note (Signed)
-  stable and mild overall

## 2022-08-12 ENCOUNTER — Encounter: Payer: Self-pay | Admitting: Hematology

## 2022-08-12 ENCOUNTER — Inpatient Hospital Stay: Payer: Medicare (Managed Care)

## 2022-08-12 ENCOUNTER — Inpatient Hospital Stay: Payer: Medicare (Managed Care) | Attending: Hematology | Admitting: Hematology

## 2022-08-12 ENCOUNTER — Other Ambulatory Visit: Payer: Self-pay | Admitting: Hematology

## 2022-08-12 ENCOUNTER — Other Ambulatory Visit: Payer: Self-pay

## 2022-08-12 VITALS — BP 133/85 | HR 73 | Temp 98.2°F | Resp 18 | Ht 66.0 in | Wt 182.2 lb

## 2022-08-12 DIAGNOSIS — R197 Diarrhea, unspecified: Secondary | ICD-10-CM | POA: Diagnosis not present

## 2022-08-12 DIAGNOSIS — Z5111 Encounter for antineoplastic chemotherapy: Secondary | ICD-10-CM | POA: Diagnosis not present

## 2022-08-12 DIAGNOSIS — C7800 Secondary malignant neoplasm of unspecified lung: Secondary | ICD-10-CM | POA: Insufficient documentation

## 2022-08-12 DIAGNOSIS — T451X5A Adverse effect of antineoplastic and immunosuppressive drugs, initial encounter: Secondary | ICD-10-CM | POA: Diagnosis not present

## 2022-08-12 DIAGNOSIS — C221 Intrahepatic bile duct carcinoma: Secondary | ICD-10-CM

## 2022-08-12 DIAGNOSIS — C19 Malignant neoplasm of rectosigmoid junction: Secondary | ICD-10-CM | POA: Insufficient documentation

## 2022-08-12 DIAGNOSIS — C787 Secondary malignant neoplasm of liver and intrahepatic bile duct: Secondary | ICD-10-CM | POA: Diagnosis present

## 2022-08-12 DIAGNOSIS — G620 Drug-induced polyneuropathy: Secondary | ICD-10-CM | POA: Diagnosis not present

## 2022-08-12 DIAGNOSIS — Z90710 Acquired absence of both cervix and uterus: Secondary | ICD-10-CM | POA: Insufficient documentation

## 2022-08-12 DIAGNOSIS — Z5189 Encounter for other specified aftercare: Secondary | ICD-10-CM | POA: Diagnosis not present

## 2022-08-12 DIAGNOSIS — Z95828 Presence of other vascular implants and grafts: Secondary | ICD-10-CM

## 2022-08-12 DIAGNOSIS — Z79899 Other long term (current) drug therapy: Secondary | ICD-10-CM | POA: Insufficient documentation

## 2022-08-12 DIAGNOSIS — T451X5D Adverse effect of antineoplastic and immunosuppressive drugs, subsequent encounter: Secondary | ICD-10-CM | POA: Diagnosis not present

## 2022-08-12 DIAGNOSIS — Z5112 Encounter for antineoplastic immunotherapy: Secondary | ICD-10-CM | POA: Diagnosis present

## 2022-08-12 DIAGNOSIS — C189 Malignant neoplasm of colon, unspecified: Secondary | ICD-10-CM

## 2022-08-12 LAB — CBC WITH DIFFERENTIAL (CANCER CENTER ONLY)
Abs Immature Granulocytes: 0.01 10*3/uL (ref 0.00–0.07)
Basophils Absolute: 0 10*3/uL (ref 0.0–0.1)
Basophils Relative: 1 %
Eosinophils Absolute: 0.1 10*3/uL (ref 0.0–0.5)
Eosinophils Relative: 3 %
HCT: 35.1 % — ABNORMAL LOW (ref 36.0–46.0)
Hemoglobin: 11.5 g/dL — ABNORMAL LOW (ref 12.0–15.0)
Immature Granulocytes: 0 %
Lymphocytes Relative: 29 %
Lymphs Abs: 1.3 10*3/uL (ref 0.7–4.0)
MCH: 30 pg (ref 26.0–34.0)
MCHC: 32.8 g/dL (ref 30.0–36.0)
MCV: 91.6 fL (ref 80.0–100.0)
Monocytes Absolute: 0.6 10*3/uL (ref 0.1–1.0)
Monocytes Relative: 12 %
Neutro Abs: 2.6 10*3/uL (ref 1.7–7.7)
Neutrophils Relative %: 55 %
Platelet Count: 313 10*3/uL (ref 150–400)
RBC: 3.83 MIL/uL — ABNORMAL LOW (ref 3.87–5.11)
RDW: 17.2 % — ABNORMAL HIGH (ref 11.5–15.5)
WBC Count: 4.7 10*3/uL (ref 4.0–10.5)
nRBC: 0 % (ref 0.0–0.2)

## 2022-08-12 LAB — CMP (CANCER CENTER ONLY)
ALT: 12 U/L (ref 0–44)
AST: 13 U/L — ABNORMAL LOW (ref 15–41)
Albumin: 4.2 g/dL (ref 3.5–5.0)
Alkaline Phosphatase: 75 U/L (ref 38–126)
Anion gap: 6 (ref 5–15)
BUN: 11 mg/dL (ref 8–23)
CO2: 28 mmol/L (ref 22–32)
Calcium: 10 mg/dL (ref 8.9–10.3)
Chloride: 105 mmol/L (ref 98–111)
Creatinine: 0.66 mg/dL (ref 0.44–1.00)
GFR, Estimated: >60 mL/min (ref >60)
Glucose, Bld: 124 mg/dL — ABNORMAL HIGH (ref 70–99)
Potassium: 4 mmol/L (ref 3.5–5.1)
Sodium: 139 mmol/L (ref 135–145)
Total Bilirubin: 0.3 mg/dL (ref 0.3–1.2)
Total Protein: 7.6 g/dL (ref 6.5–8.1)

## 2022-08-12 LAB — TOTAL PROTEIN, URINE DIPSTICK: Protein, ur: NEGATIVE mg/dL

## 2022-08-12 LAB — CEA (ACCESS): CEA (CHCC): 2.44 ng/mL (ref 0.00–5.00)

## 2022-08-12 LAB — MAGNESIUM: Magnesium: 2 mg/dL (ref 1.7–2.4)

## 2022-08-12 MED ORDER — SODIUM CHLORIDE 0.9 % IV SOLN: Freq: Once | INTRAVENOUS | Status: DC

## 2022-08-12 MED ORDER — SODIUM CHLORIDE 0.9 % IV SOLN
5.0000 mg/kg | Freq: Once | INTRAVENOUS | Status: AC
  Administered 2022-08-12: 400 mg via INTRAVENOUS
  Filled 2022-08-12: qty 16

## 2022-08-12 MED ORDER — PALONOSETRON HCL INJECTION 0.25 MG/5ML
0.2500 mg | Freq: Once | INTRAVENOUS | Status: AC
  Administered 2022-08-12: 0.25 mg via INTRAVENOUS
  Filled 2022-08-12: qty 5

## 2022-08-12 MED ORDER — SODIUM CHLORIDE 0.9 % IV SOLN
2400.0000 mg/m2 | INTRAVENOUS | Status: DC
  Administered 2022-08-12: 5000 mg via INTRAVENOUS
  Filled 2022-08-12: qty 100

## 2022-08-12 MED ORDER — SODIUM CHLORIDE 0.9% FLUSH
10.0000 mL | Freq: Once | INTRAVENOUS | Status: AC
  Administered 2022-08-12: 10 mL

## 2022-08-12 MED ORDER — SODIUM CHLORIDE 0.9 % IV SOLN
10.0000 mg | Freq: Once | INTRAVENOUS | Status: AC
  Administered 2022-08-12: 10 mg via INTRAVENOUS
  Filled 2022-08-12: qty 10

## 2022-08-12 MED ORDER — SODIUM CHLORIDE 0.9 % IV SOLN
180.0000 mg/m2 | Freq: Once | INTRAVENOUS | Status: AC
  Administered 2022-08-12: 360 mg via INTRAVENOUS
  Filled 2022-08-12: qty 18

## 2022-08-12 MED ORDER — SODIUM CHLORIDE 0.9 % IV SOLN: Freq: Once | INTRAVENOUS | Status: AC

## 2022-08-12 MED ORDER — SODIUM CHLORIDE 0.9 % IV SOLN
400.0000 mg/m2 | Freq: Once | INTRAVENOUS | Status: AC
  Administered 2022-08-12: 780 mg via INTRAVENOUS
  Filled 2022-08-12: qty 25

## 2022-08-12 MED ORDER — ATROPINE SULFATE 1 MG/ML IV SOLN
0.5000 mg | Freq: Once | INTRAVENOUS | Status: AC | PRN
  Administered 2022-08-12: 0.5 mg via INTRAVENOUS
  Filled 2022-08-12: qty 1

## 2022-08-12 NOTE — Progress Notes (Signed)
[facility]   [phone] [phone]   Clinic Follow up Note   Patient Care Team: Ralene Ok, MD as PCP - General (Internal Medicine) Radonna Ricker, RN (Inactive) as Oncology Nurse Navigator Malachy Mood, MD as Consulting Physician (Oncology) Jeani Hawking, MD as Consulting Physician (Gastroenterology)  Date of Service:  08/12/2022  CHIEF COMPLAINT: f/u of metastatic colon cancer   CURRENT THERAPY: FOLFIRI/Beva q14 days, started 03/25/22      ASSESSMENT:  Lindsay [name] is a 67 y.o. 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 Liver biopsy 01/08/20 confirmed metastasis from primary colorectal cancer. PET scan showed hypermetabolism to known liver mets, diffuse thoracic and abdominal lymphadenopathy, a 1.8 cm LUL pulmonary nodule, and splenic flexure of colon. -treated with first line FOLFOX 01/25/20 - 10/02/20, Vectibix added with C2. Oxali discontinued after C18 due to reaction. -switched to Xeloda 10/21/20, dose adjusted due to significant skin toxicity -due to cancer progression on recent staging scan, treatment has been changed to FOLFIRI and bevacizumab on 03/25/22, she is overall tolerating well -will repeat CT scan after next cycle chemo  -Restaging CT scan from 06/16/2022 showed stable to mild decrease in size of treated liver metastasis. No new lesions -She is tolerating chemotherapy well overall, will continue -plan to repeat CT in 3-4 weeks      PLAN: -lab reviewed -I order CT CAP in three weeks -proceed with treatment today Avastin +Folfiri -LAB/FLUSH AND F/U 6/19  SUMMARY OF ONCOLOGIC HISTORY: Oncology History  metastatic colon cancer to liver  06/20/2019 Procedure   Colonoscopy by Dr Meridee Score 06/20/19   IMPRESSION -Seven 3 to 10 mm polyps in the sigmoid colon, in the transverse colon and in the escending colon, removed with a cold snare. Resected and retrireved.  -One 20mm polyp in the descending colon. Biopsies. Tattoes.  -Mediaum sized lipoma in the ascending colon.   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 See Comment B. Colon, Ascending, polyp, Polypectomy:  -TUBULAR ADENOMA -No high grade dysplasia or malignancy.  C. Colon, TRansverse, Polyo, polectomy:  -TUBULAR ADENOMA -No high grade dysplasia or malignancy.  D. Colon, Sigmoid, Polyp, Polypectomy:  -HYPERPLASTIC POLYP   11/07/2019 Imaging   US Abdomen 11/07/19  IMPRESSION: 1. Two solid masses in the liver are nonspecific. Recommend MRI abdomen with and without contrast for further evaluation.   12/15/2019 Imaging   MRI Abdomen 12/15/19  IMPRESSION: 1. There are two large masses in the liver with appearance favoring metastatic disease or hepatocellular carcinoma or cholangiocarcinoma. A benign etiology is highly unlikely given the enhancement pattern and associated adenopathy. 2. Considerable porta hepatis and retroperitoneal adenopathy. Some of the confluent porta hepatis tumor is potentially infiltrative and abuts the pancreatic body along its right upper margin, making it difficult to completely exclude the possibility of pancreatic adenocarcinoma primary. Possibilities helpful in further workup might include tissue diagnosis, endoscopic ultrasound, or nuclear medicine PET-CT. 3. Pancreas divisum. 4. Lumbar spondylosis and degenerative disc disease. 5. Despite efforts by the technologist and patient, motion artifact is present on today's exam and could not be eliminated. This reduces exam sensitivity and specificity.   12/22/2019 Procedure   Upper Endoscopy by Dr Elnoria Howard 12/22/19  IMPRESSION - One  lymph node was visualized and  measured in the porta hepatis region. Fine needle aspiration performed    12/22/2019 Initial Biopsy   A. LIVER, PORTA HEPATIS MASS, FINE NEEDLE 12/22/19 ASPIRATION:  FINAL MICROSCOPIC DIAGNOSIS:  - Malignant cells consistent with metastatic adenocarcinoma   01/01/2020 Initial Diagnosis   Intrahepatic cholangiocarcinoma (HCC)   01/08/2020 Initial Biopsy   FINAL MICROSCOPIC DIAGNOSIS:   A. LIVER, LEFT LOBE, BIOPSY:  - Metastatic adenocarcinoma, consistent with a colorectal primary.  See  comment      COMMENT:   Immunohistochemical stains show the tumor cells are positive for CK20  and CDX2 but negative for CK7, consistent with above interpretation.  Dr. Mosetta Putt was notified on 01/10/2020   01/08/2020 Genetic Testing   [name]  [name] wildtype and [name]/[name] mutations which make her eligible for target biological agent Vectibix.   01/24/2020 Procedure   PAC placed 01/24/20   01/25/2020 -  Chemotherapy   first line FOLFOX starting 01/25/2020, Vextibix  added with C2 (02/06/20)   02/06/2020 - 02/06/2022 Chemotherapy   Patient is on Treatment Plan : COLORECTAL Panitumumab q14d ([name] Wild - Type Gene Only)     06/20/2020 Imaging   CT CAP  IMPRESSION: 1. Continued interval reduction in size and conspicuity of a subsolid nodule of the peripheral left upper lobe. 2. Unchanged prominent pretracheal and subcarinal lymph nodes. 3. Redemonstrated partially calcified low-attenuation liver masses, slightly decreased in size compared to prior examination. 4. Slight interval decrease in size of a portacaval lymph node or conglomerate and retroperitoneal lymph nodes. 5. Findings are consistent with continued treatment response of nodal, pulmonary, and hepatic metastatic disease. 6. Coronary artery disease.   Aortic Atherosclerosis (ICD10-I70.0).     10/09/2020 Imaging   IMPRESSION: 1. Slight decrease in size of dominant liver mass with smaller liver mass within 1-2 mm of prior  measurement. 2. Stable appearance of celiac lymph and retroperitoneal lymph nodes. Dominant node with calcification in the gastrohepatic ligament as described. 3. Continued decrease in size of LEFT upper lobe nodule. 4. Three-vessel coronary artery calcification. 5. Aortic atherosclerosis.   03/25/2022 -  Chemotherapy   Patient is on Treatment Plan : COLORECTAL FOLFIRI + Bevacizumab q14d        INTERVAL HISTORY:  Lindsay [name] is here for a follow up of metastatic colon cancer. She was last seen by PA-C Cassie on 07/28/2022. She presents to the clinic alone. Pt state that she is doing well,but she still have some diarrhea. She goes about 1-2 times a day. She also reports of having some numbness ad tingling in her hands, but is tolerable.     All other systems were reviewed with the patient and are negative.  MEDICAL HISTORY:  Past Medical History:  Diagnosis Date   Arthritis    Diabetes mellitus without complication (HCC)    Hypertension    met colon ca to liver 01/2020    SURGICAL HISTORY: Past Surgical History:  Procedure Laterality Date   ABDOMINAL HYSTERECTOMY     ANTERIOR AND POSTERIOR REPAIR WITH SACROSPINOUS FIXATION N/A 07/16/2016   Procedure: ANTERIOR AND POSTERIOR REPAIR WITH SACROSPINOUS FIXATION;  Surgeon: Harold Hedge, MD;  Location: [facility];  Service: Gynecology;  Laterality: N/A;   CYSTOSCOPY  07/16/2016   Procedure: CYSTOSCOPY;  Surgeon: Harold Hedge, MD;  Location: [facility];  Service: Gynecology;;   ESOPHAGOGASTRODUODENOSCOPY (EGD) WITH PROPOFOL N/A 12/22/2019   Procedure: ESOPHAGOGASTRODUODENOSCOPY (EGD) WITH PROPOFOL;  Surgeon: Jeani Hawking, MD;  Location: [facility];  Service: Endoscopy;  Laterality: N/A;   FINE NEEDLE ASPIRATION N/A 12/22/2019   Procedure: FINE NEEDLE ASPIRATION (FNA) LINEAR;  Surgeon: Jeani Hawking, MD;  Location: [facility];  Service: Endoscopy;  Laterality: N/A;   IR IMAGING GUIDED PORT INSERTION  01/24/2020   LAPAROSCOPIC VAGINAL  HYSTERECTOMY WITH SALPINGECTOMY Bilateral 07/16/2016   Procedure: LAPAROSCOPIC ASSISTED VAGINAL HYSTERECTOMY WITH SALPINGECTOMY;  Surgeon: Harold Hedge, MD;  Location: [facility];  Service: Gynecology;  Laterality: Bilateral;   MYOMECTOMY ABDOMINAL APPROACH     THYROID SURGERY     tyroid     UPPER ESOPHAGEAL ENDOSCOPIC ULTRASOUND (EUS) N/A 12/22/2019   Procedure: UPPER ESOPHAGEAL ENDOSCOPIC ULTRASOUND (EUS);  Surgeon: Jeani Hawking, MD;  Location: [facility];  Service: Endoscopy;  Laterality: N/A;    I have reviewed the social history and family history with the patient and they are unchanged from previous note.  ALLERGIES:  is allergic to oxaliplatin.  MEDICATIONS:  Current Outpatient Medications  Medication Sig Dispense Refill   aspirin EC 81 MG tablet Take 1 tablet (81 mg total) by mouth daily. 90 tablet 3   atorvastatin (LIPITOR) 20 MG tablet Take 20 mg by mouth at bedtime.   1   clindamycin (CLINDAGEL) 1 % gel Apply topically 2 (two) times daily. 60 g 2   clobetasol cream (TEMOVATE) 0.05 % Apply 1 application topically daily.     hydrochlorothiazide (MICROZIDE) 12.5 MG capsule Take 12.5 mg by mouth daily.     KLOR-CON M20 20 MEQ tablet TAKE 1 TABLET TWICE A DAY 180 tablet 3   lidocaine-prilocaine (EMLA) cream Apply 1 Application topically as needed. 30 g 1   magic mouthwash w/lidocaine SOLN Take 5 mLs by mouth 4 (four) times daily. 475 mL 2   MAGNESIUM-OXIDE 400 (240 Mg) MG tablet TAKE 2 TABLETS BY MOUTH 4 TIMES DAILY 240 tablet 0   omeprazole (PRILOSEC) 20 MG capsule Take 1 capsule (20 mg total) by mouth daily. (Patient not taking: Reported on 06/03/2022) 30 capsule 2   ondansetron (ZOFRAN) 8 MG tablet Take 1 tablet (8 mg total) by mouth every 8 (eight) hours as needed. 20 tablet 0   prochlorperazine (COMPAZINE) 10 MG tablet Take 1 tablet (10 mg total) by mouth every 6 (six) hours as needed for nausea or vomiting. 30 tablet 2   spironolactone (ALDACTONE) 25 MG tablet Take 25 mg by  mouth daily with breakfast.     urea (CARMOL) 10 % cream Apply topically 2 (two) times daily. 71 g 2   valACYclovir (VALTREX) 1000 MG tablet Take 1 tablet (1,000 mg total) by mouth 2 (two) times daily. 10 tablet 0   verapamil (CALAN) 120 MG tablet Take 120 mg by mouth 3 (three) times daily.     No current facility-administered medications for this visit.   Facility-Administered Medications Ordered in Other Visits  Medication Dose Route Frequency Provider Last Rate Last Admin   0.9 %  sodium chloride infusion   Intravenous Once Malachy Mood, MD       fluorouracil (ADRUCIL) 5,000 mg in sodium chloride 0.9 % 150 mL chemo infusion  2,400 mg/m2 (Treatment Plan Recorded) Intravenous 1 day or 1 dose Malachy Mood, MD       irinotecan (CAMPTOSAR) 360 mg in sodium chloride 0.9 % 500 mL chemo infusion  180 mg/m2 (Treatment Plan Recorded) Intravenous Once Malachy Mood, MD 345 mL/hr at 08/12/22 1126 360 mg at 08/12/22 1126   leucovorin 780 mg in sodium chloride 0.9 % 250 mL infusion  400 mg/m2 (Treatment Plan Recorded) Intravenous  Once Malachy Mood, MD 193 mL/hr at 08/12/22 1124 780 mg at 08/12/22 1124    PHYSICAL EXAMINATION: ECOG PERFORMANCE STATUS: 0 - Asymptomatic  Vitals:   08/12/22 0919  BP: 133/85  Pulse: 73  Resp: 18  Temp: 98.2 F (36.8 C)  SpO2: 100%   Wt Readings from Last 3 Encounters:  08/12/22 182 lb 3.2 oz (82.6 kg)  07/28/22 181 lb 12.8 oz (82.5 kg)  07/15/22 184 lb 11.2 oz (83.8 kg)     GENERAL:alert, no distress and comfortable SKIN: skin color normal, no rashes or significant lesions EYES: normal, Conjunctiva are pink and non-injected, sclera clear  NEURO: alert & oriented x 3 with fluent speech  LABORATORY DATA:  I have reviewed the data as listed    Latest Ref Rng & Units 08/12/2022    8:53 AM 07/28/2022   10:02 AM 07/15/2022    8:48 AM  CBC  WBC 4.0 - 10.5 K/uL 4.7  10.3  9.9   Hemoglobin 12.0 - 15.0 g/dL 57.8  46.9  62.9   Hematocrit 36.0 - 46.0 % 35.1  37.5  35.3    Platelets 150 - 400 K/uL 313  262  215         Latest Ref Rng & Units 08/12/2022    8:53 AM 07/28/2022   10:02 AM 07/15/2022    8:48 AM  CMP  Glucose 70 - 99 mg/dL 528  413  244   BUN 8 - 23 mg/dL 11  14  13    Creatinine 0.44 - 1.00 mg/dL 0.10  2.72  5.36   Sodium 135 - 145 mmol/L 139  139  138   Potassium 3.5 - 5.1 mmol/L 4.0  4.1  4.0   Chloride 98 - 111 mmol/L 105  103  105   CO2 22 - 32 mmol/L 28  29  29    Calcium 8.9 - 10.3 mg/dL 64.4  9.8  9.4   Total Protein 6.5 - 8.1 g/dL 7.6  7.6  7.4   Total Bilirubin 0.3 - 1.2 mg/dL 0.3  0.3  0.2   Alkaline Phos 38 - 126 U/L 75  118  104   AST 15 - 41 U/L 13  14  15    ALT 0 - 44 U/L 12  17  21        RADIOGRAPHIC STUDIES: I have personally reviewed the radiological images as listed and agreed with the findings in the report. No results found.    Orders Placed This Encounter  Procedures   CT CHEST ABDOMEN PELVIS W CONTRAST    Standing Status:   Future    Standing Expiration Date:   08/12/2023    Order Specific Question:   If indicated for the ordered procedure, I authorize the administration of contrast media per Radiology protocol    Answer:   Yes    Order Specific Question:   Does the patient have a contrast media/X-ray dye allergy?    Answer:   No    Order Specific Question:   Preferred imaging location?    Answer:   [facility]    Order Specific Question:   Release to patient    Answer:   Immediate    Order Specific Question:   If indicated for the ordered procedure, I authorize the administration of oral contrast media per Radiology protocol    Answer:   Yes   CBC with Differential (Cancer Center Only)    Standing Status:   Future  Standing Expiration Date:   10/07/2023   CMP (Cancer Center only)    Standing Status:   Future    Standing Expiration Date:   10/07/2023   Total Protein, Urine dipstick    Standing Status:   Future    Standing Expiration Date:   10/07/2023   All questions were answered. The patient  knows to call the clinic with any problems, questions or concerns. No barriers to learning was detected. The total time spent in the appointment was 25 minutes.     Malachy Mood, MD 08/12/2022   Carolin Coy, CMA, am acting as scribe for Malachy Mood, MD.   I have reviewed the above documentation for accuracy and completeness, and I agree with the above.

## 2022-08-12 NOTE — Patient Instructions (Signed)
[facility]  Discharge Instructions: Thank you for choosing [facility] to provide your oncology and hematology care.   If you have a lab appointment with the Cancer Center, please go directly to the Cancer Center and check in at the registration area.   Wear comfortable clothing and clothing appropriate for easy access to any Portacath or PICC line.   We strive to give you quality time with your provider. You may need to reschedule your appointment if you arrive late (15 or more minutes).  Arriving late affects you and other patients whose appointments are after yours.  Also, if you miss three or more appointments without notifying the office, you may be dismissed from the clinic at the provider's discretion.      For prescription refill requests, have your pharmacy contact our office and allow 72 hours for refills to be completed.    Today you received the following chemotherapy and/or immunotherapy agents : Irinotecan, leucovorin, 5FU      To help prevent nausea and vomiting after your treatment, we encourage you to take your nausea medication as directed.  BELOW ARE SYMPTOMS THAT SHOULD BE REPORTED IMMEDIATELY: *FEVER GREATER THAN 100.4 F (38 C) OR HIGHER *CHILLS OR SWEATING *NAUSEA AND VOMITING THAT IS NOT CONTROLLED WITH YOUR NAUSEA MEDICATION *UNUSUAL SHORTNESS OF BREATH *UNUSUAL BRUISING OR BLEEDING *URINARY PROBLEMS (pain or burning when urinating, or frequent urination) *BOWEL PROBLEMS (unusual diarrhea, constipation, pain near the anus) TENDERNESS IN MOUTH AND THROAT WITH OR WITHOUT PRESENCE OF ULCERS (sore throat, sores in mouth, or a toothache) UNUSUAL RASH, SWELLING OR PAIN  UNUSUAL VAGINAL DISCHARGE OR ITCHING   Items with * indicate a potential emergency and should be followed up as soon as possible or go to the Emergency Department if any problems should occur.  Please show the CHEMOTHERAPY ALERT CARD or IMMUNOTHERAPY  ALERT CARD at check-in to the Emergency Department and triage nurse.  Should you have questions after your visit or need to cancel or reschedule your appointment, please contact [facility]  Dept: [phone]  and follow the prompts.  Office hours are 8:00 a.m. to 4:30 p.m. Monday - Friday. Please note that voicemails left after 4:00 p.m. may not be returned until the following business day.  We are closed weekends and major holidays. You have access to a nurse at all times for urgent questions. Please call the main number to the clinic Dept: [phone] and follow the prompts.   For any non-urgent questions, you may also contact your provider using MyChart. We now offer e-Visits for anyone 18 and older to request care online for non-urgent symptoms. For details visit [email or web address].   Also download the MyChart app! Go to the app store, search "MyChart", open the app, select [facility], and log in with your MyChart username and password.   

## 2022-08-13 ENCOUNTER — Encounter: Payer: Self-pay | Admitting: Hematology

## 2022-08-13 ENCOUNTER — Other Ambulatory Visit: Payer: Self-pay

## 2022-08-14 ENCOUNTER — Inpatient Hospital Stay: Payer: Medicare (Managed Care)

## 2022-08-14 ENCOUNTER — Other Ambulatory Visit: Payer: Self-pay

## 2022-08-14 VITALS — BP 130/74 | HR 70 | Temp 98.3°F | Resp 18

## 2022-08-14 DIAGNOSIS — Z5112 Encounter for antineoplastic immunotherapy: Secondary | ICD-10-CM | POA: Diagnosis not present

## 2022-08-14 DIAGNOSIS — C221 Intrahepatic bile duct carcinoma: Secondary | ICD-10-CM

## 2022-08-14 MED ORDER — HEPARIN SOD (PORK) LOCK FLUSH 100 UNIT/ML IV SOLN
500.0000 [IU] | Freq: Once | INTRAVENOUS | Status: AC | PRN
  Administered 2022-08-14: 500 [IU]

## 2022-08-14 MED ORDER — PEGFILGRASTIM-CBQV 6 MG/0.6ML ~~LOC~~ SOSY
6.0000 mg | PREFILLED_SYRINGE | Freq: Once | SUBCUTANEOUS | Status: AC
  Administered 2022-08-14: 6 mg via SUBCUTANEOUS
  Filled 2022-08-14: qty 0.6

## 2022-08-14 MED ORDER — SODIUM CHLORIDE 0.9% FLUSH
10.0000 mL | INTRAVENOUS | Status: DC | PRN
  Administered 2022-08-14: 10 mL

## 2022-08-25 MED FILL — Dexamethasone Sodium Phosphate Inj 100 MG/10ML: INTRAMUSCULAR | Qty: 1 | Status: AC

## 2022-08-26 ENCOUNTER — Other Ambulatory Visit: Payer: Self-pay

## 2022-08-26 ENCOUNTER — Inpatient Hospital Stay: Payer: Medicare (Managed Care) | Admitting: Hematology

## 2022-08-26 ENCOUNTER — Inpatient Hospital Stay: Payer: Medicare (Managed Care)

## 2022-08-26 ENCOUNTER — Encounter: Payer: Self-pay | Admitting: Hematology

## 2022-08-26 VITALS — BP 140/89 | HR 69 | Temp 97.7°F | Resp 18 | Ht 66.0 in | Wt 181.3 lb

## 2022-08-26 DIAGNOSIS — C221 Intrahepatic bile duct carcinoma: Secondary | ICD-10-CM | POA: Diagnosis not present

## 2022-08-26 DIAGNOSIS — Z95828 Presence of other vascular implants and grafts: Secondary | ICD-10-CM

## 2022-08-26 DIAGNOSIS — C189 Malignant neoplasm of colon, unspecified: Secondary | ICD-10-CM

## 2022-08-26 DIAGNOSIS — Z5112 Encounter for antineoplastic immunotherapy: Secondary | ICD-10-CM | POA: Diagnosis not present

## 2022-08-26 LAB — MAGNESIUM: Magnesium: 1.7 mg/dL (ref 1.7–2.4)

## 2022-08-26 LAB — CBC WITH DIFFERENTIAL (CANCER CENTER ONLY)
Abs Immature Granulocytes: 0.06 10*3/uL (ref 0.00–0.07)
Basophils Absolute: 0 10*3/uL (ref 0.0–0.1)
Basophils Relative: 1 %
Eosinophils Absolute: 0.1 10*3/uL (ref 0.0–0.5)
Eosinophils Relative: 1 %
HCT: 36.6 % (ref 36.0–46.0)
Hemoglobin: 11.7 g/dL — ABNORMAL LOW (ref 12.0–15.0)
Immature Granulocytes: 1 %
Lymphocytes Relative: 15 %
Lymphs Abs: 1.2 10*3/uL (ref 0.7–4.0)
MCH: 29.8 pg (ref 26.0–34.0)
MCHC: 32 g/dL (ref 30.0–36.0)
MCV: 93.1 fL (ref 80.0–100.0)
Monocytes Absolute: 0.5 10*3/uL (ref 0.1–1.0)
Monocytes Relative: 6 %
Neutro Abs: 6.6 10*3/uL (ref 1.7–7.7)
Neutrophils Relative %: 76 %
Platelet Count: 225 10*3/uL (ref 150–400)
RBC: 3.93 MIL/uL (ref 3.87–5.11)
RDW: 17.5 % — ABNORMAL HIGH (ref 11.5–15.5)
WBC Count: 8.6 10*3/uL (ref 4.0–10.5)
nRBC: 0 % (ref 0.0–0.2)

## 2022-08-26 LAB — CMP (CANCER CENTER ONLY)
ALT: 16 U/L (ref 0–44)
AST: 14 U/L — ABNORMAL LOW (ref 15–41)
Albumin: 3.9 g/dL (ref 3.5–5.0)
Alkaline Phosphatase: 105 U/L (ref 38–126)
Anion gap: 6 (ref 5–15)
BUN: 12 mg/dL (ref 8–23)
CO2: 27 mmol/L (ref 22–32)
Calcium: 9.7 mg/dL (ref 8.9–10.3)
Chloride: 105 mmol/L (ref 98–111)
Creatinine: 0.67 mg/dL (ref 0.44–1.00)
GFR, Estimated: >60 mL/min (ref >60)
Glucose, Bld: 140 mg/dL — ABNORMAL HIGH (ref 70–99)
Potassium: 4 mmol/L (ref 3.5–5.1)
Sodium: 138 mmol/L (ref 135–145)
Total Bilirubin: 0.3 mg/dL (ref 0.3–1.2)
Total Protein: 7.1 g/dL (ref 6.5–8.1)

## 2022-08-26 LAB — CEA (ACCESS): CEA (CHCC): 2.64 ng/mL (ref 0.00–5.00)

## 2022-08-26 MED ORDER — SODIUM CHLORIDE 0.9 % IV SOLN
5.0000 mg/kg | Freq: Once | INTRAVENOUS | Status: AC
  Administered 2022-08-26: 400 mg via INTRAVENOUS
  Filled 2022-08-26: qty 16

## 2022-08-26 MED ORDER — SODIUM CHLORIDE 0.9 % IV SOLN
180.0000 mg/m2 | Freq: Once | INTRAVENOUS | Status: AC
  Administered 2022-08-26: 360 mg via INTRAVENOUS
  Filled 2022-08-26: qty 3

## 2022-08-26 MED ORDER — SODIUM CHLORIDE 0.9% FLUSH: 10.0000 mL | INTRAVENOUS | Status: DC | PRN

## 2022-08-26 MED ORDER — HEPARIN SOD (PORK) LOCK FLUSH 100 UNIT/ML IV SOLN: 500.0000 [IU] | Freq: Once | INTRAVENOUS | Status: DC | PRN

## 2022-08-26 MED ORDER — SODIUM CHLORIDE 0.9% FLUSH
10.0000 mL | Freq: Once | INTRAVENOUS | Status: AC
  Administered 2022-08-26: 10 mL

## 2022-08-26 MED ORDER — PALONOSETRON HCL INJECTION 0.25 MG/5ML
0.2500 mg | Freq: Once | INTRAVENOUS | Status: AC
  Administered 2022-08-26: 0.25 mg via INTRAVENOUS
  Filled 2022-08-26: qty 5

## 2022-08-26 MED ORDER — SODIUM CHLORIDE 0.9 % IV SOLN: Freq: Once | INTRAVENOUS | Status: AC

## 2022-08-26 MED ORDER — ATROPINE SULFATE 1 MG/ML IV SOLN
0.5000 mg | Freq: Once | INTRAVENOUS | Status: AC | PRN
  Administered 2022-08-26: 0.5 mg via INTRAVENOUS
  Filled 2022-08-26: qty 1

## 2022-08-26 MED ORDER — SODIUM CHLORIDE 0.9 % IV SOLN
10.0000 mg | Freq: Once | INTRAVENOUS | Status: AC
  Administered 2022-08-26: 10 mg via INTRAVENOUS
  Filled 2022-08-26: qty 10

## 2022-08-26 MED ORDER — SODIUM CHLORIDE 0.9 % IV SOLN
400.0000 mg/m2 | Freq: Once | INTRAVENOUS | Status: AC
  Administered 2022-08-26: 780 mg via INTRAVENOUS
  Filled 2022-08-26: qty 25

## 2022-08-26 MED ORDER — SODIUM CHLORIDE 0.9 % IV SOLN
2400.0000 mg/m2 | INTRAVENOUS | Status: DC
  Administered 2022-08-26: 5000 mg via INTRAVENOUS
  Filled 2022-08-26: qty 100

## 2022-08-26 NOTE — Progress Notes (Signed)
Per Dr. Mosetta Putt- because patient had her urine protein done last cycle- she does not have to do it today. Last urine protein negative.

## 2022-08-26 NOTE — Progress Notes (Signed)
[facility]   [phone] [phone]   Clinic Follow up Note   Patient Care Team: Ralene Ok, MD as PCP - General (Internal Medicine) Radonna Ricker, RN (Inactive) as Oncology Nurse Navigator Malachy Mood, MD as Consulting Physician (Oncology) Jeani Hawking, MD as Consulting Physician (Gastroenterology)  Date of Service   08/26/2022  CHIEF COMPLAINT: f/u of metastatic colon cancer   CURRENT THERAPY: FOLFIRI/Beva q14 days, started 03/25/22      ASSESSMENT:  Lindsay [name] is a 67 y.o. 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 Liver biopsy 01/08/20 confirmed metastasis from primary colorectal cancer. PET scan showed hypermetabolism to known liver mets, diffuse thoracic and abdominal lymphadenopathy, a 1.8 cm LUL pulmonary nodule, and splenic flexure of colon. -treated with first line FOLFOX 01/25/20 - 10/02/20, Vectibix added with C2. Oxali discontinued after C18 due to reaction. -switched to Xeloda 10/21/20, dose adjusted due to significant skin toxicity -due to cancer progression on recent staging scan, treatment has been changed to FOLFIRI and bevacizumab on 03/25/22, she is overall tolerating well -will repeat CT scan after next cycle chemo  -Restaging CT scan from 06/16/2022 showed stable to mild decrease in size of treated liver metastasis. No new lesions -She is tolerating chemotherapy well overall, will continue -She is scheduled for restaging CT scan on August 31, 2022, will review the results on next visit in 2 weeks.      PLAN - reviewed labs, adequate for treatment, will proceed same dose chemo today - CT June 24 -Lab, flush, follow-up and chemotherapy in 2 weeks   SUMMARY OF ONCOLOGIC HISTORY: Oncology  History  metastatic colon cancer to liver  06/20/2019 Procedure   Colonoscopy by Dr Meridee Score 06/20/19  IMPRESSION -Seven 3 to 10 mm polyps in the sigmoid colon, in the transverse colon and in the escending colon, removed with a cold snare. Resected and retrireved.  -One 20mm polyp in the descending colon. Biopsies. Tattoes.  -Mediaum sized lipoma in the ascending colon.   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 See Comment B. Colon, Ascending, polyp, Polypectomy:  -TUBULAR ADENOMA -No high grade dysplasia or malignancy.  C. Colon, TRansverse, Polyo, polectomy:  -TUBULAR ADENOMA -No high grade dysplasia or malignancy.  D. Colon, Sigmoid, Polyp, Polypectomy:  -HYPERPLASTIC POLYP   11/07/2019 Imaging   US Abdomen 11/07/19  IMPRESSION: 1. Two solid masses in the liver are nonspecific. Recommend MRI abdomen with and without contrast for further evaluation.   12/15/2019 Imaging   MRI Abdomen 12/15/19  IMPRESSION: 1. There are two large masses in the liver with appearance favoring metastatic disease or hepatocellular carcinoma or cholangiocarcinoma. A benign etiology is highly unlikely given the enhancement pattern and associated adenopathy. 2. Considerable porta hepatis and retroperitoneal adenopathy. Some of the confluent porta hepatis tumor is potentially infiltrative and abuts the pancreatic body along its right upper margin, making it difficult to completely exclude the possibility of pancreatic adenocarcinoma primary. Possibilities helpful in further workup might include tissue diagnosis, endoscopic ultrasound, or nuclear medicine PET-CT. 3. Pancreas divisum. 4. Lumbar spondylosis and degenerative disc disease. 5. Despite efforts by the technologist and patient, motion artifact is present on today's exam and could not be eliminated.  This reduces exam sensitivity and specificity.   12/22/2019  Procedure   Upper Endoscopy by Dr Elnoria Howard 12/22/19  IMPRESSION - One lymph node was visualized and measured in the porta hepatis region. Fine needle aspiration performed    12/22/2019 Initial Biopsy   A. LIVER, PORTA HEPATIS MASS, FINE NEEDLE 12/22/19 ASPIRATION:  FINAL MICROSCOPIC DIAGNOSIS:  - Malignant cells consistent with metastatic adenocarcinoma   01/01/2020 Initial Diagnosis   Intrahepatic cholangiocarcinoma (HCC)   01/08/2020 Initial Biopsy   FINAL MICROSCOPIC DIAGNOSIS:   A. LIVER, LEFT LOBE, BIOPSY:  - Metastatic adenocarcinoma, consistent with a colorectal primary.  See  comment      COMMENT:   Immunohistochemical stains show the tumor cells are positive for CK20  and CDX2 but negative for CK7, consistent with above interpretation.  Dr. Mosetta Putt was notified on 01/10/2020   01/08/2020 Genetic Testing   [name]  [name] wildtype and [name]/[name] mutations which make her eligible for target biological agent Vectibix.   01/24/2020 Procedure   PAC placed 01/24/20   01/25/2020 -  Chemotherapy   first line FOLFOX starting 01/25/2020, Vextibix  added with C2 (02/06/20)   02/06/2020 - 02/06/2022 Chemotherapy   Patient is on Treatment Plan : COLORECTAL Panitumumab q14d ([name] - Type Gene Only)     06/20/2020 Imaging   CT CAP  IMPRESSION: 1. Continued interval reduction in size and conspicuity of a subsolid nodule of the peripheral left upper lobe. 2. Unchanged prominent pretracheal and subcarinal lymph nodes. 3. Redemonstrated partially calcified low-attenuation liver masses, slightly decreased in size compared to prior examination. 4. Slight interval decrease in size of a portacaval lymph node or conglomerate and retroperitoneal lymph nodes. 5. Findings are consistent with continued treatment response of nodal, pulmonary, and hepatic metastatic disease. 6. Coronary artery disease.   Aortic Atherosclerosis (ICD10-I70.0).     10/09/2020 Imaging   IMPRESSION: 1.  Slight decrease in size of dominant liver mass with smaller liver mass within 1-2 mm of prior measurement. 2. Stable appearance of celiac lymph and retroperitoneal lymph nodes. Dominant node with calcification in the gastrohepatic ligament as described. 3. Continued decrease in size of LEFT upper lobe nodule. 4. Three-vessel coronary artery calcification. 5. Aortic atherosclerosis.   03/25/2022 -  Chemotherapy   Patient is on Treatment Plan : COLORECTAL FOLFIRI + Bevacizumab q14d        INTERVAL HISTORY:  Lindsay [name] is here for a follow up of metastatic colon cancer. She was last seen by me on 08/12/2022. Patient presents to clinic alone today. Patient reports feeling well today no issues. No diarrhea, appetite is good. Rash is gone.     All other systems were reviewed with the patient and are negative.  MEDICAL HISTORY:  Past Medical History:  Diagnosis Date   Arthritis    Diabetes mellitus without complication (HCC)    Hypertension    met colon ca to liver 01/2020    SURGICAL HISTORY: Past Surgical History:  Procedure Laterality Date   ABDOMINAL HYSTERECTOMY     ANTERIOR AND POSTERIOR REPAIR WITH SACROSPINOUS FIXATION N/A 07/16/2016   Procedure: ANTERIOR AND POSTERIOR REPAIR WITH SACROSPINOUS FIXATION;  Surgeon: Harold Hedge, MD;  Location: [facility];  Service: Gynecology;  Laterality: N/A;   CYSTOSCOPY  07/16/2016   Procedure: CYSTOSCOPY;  Surgeon: Harold Hedge, MD;  Location: [facility];  Service: Gynecology;;   ESOPHAGOGASTRODUODENOSCOPY (EGD) WITH PROPOFOL N/A 12/22/2019   Procedure: ESOPHAGOGASTRODUODENOSCOPY (EGD) WITH PROPOFOL;  Surgeon: Jeani Hawking, MD;  Location: [facility];  Service:  Endoscopy;  Laterality: N/A;   FINE NEEDLE ASPIRATION N/A 12/22/2019   Procedure: FINE NEEDLE ASPIRATION (FNA) LINEAR;  Surgeon: Jeani Hawking, MD;  Location: [facility];  Service: Endoscopy;  Laterality: N/A;   IR IMAGING GUIDED PORT INSERTION  01/24/2020   LAPAROSCOPIC VAGINAL  HYSTERECTOMY WITH SALPINGECTOMY Bilateral 07/16/2016   Procedure: LAPAROSCOPIC ASSISTED VAGINAL HYSTERECTOMY WITH SALPINGECTOMY;  Surgeon: Harold Hedge, MD;  Location: [facility];  Service: Gynecology;  Laterality: Bilateral;   MYOMECTOMY ABDOMINAL APPROACH     THYROID SURGERY     tyroid     UPPER ESOPHAGEAL ENDOSCOPIC ULTRASOUND (EUS) N/A 12/22/2019   Procedure: UPPER ESOPHAGEAL ENDOSCOPIC ULTRASOUND (EUS);  Surgeon: Jeani Hawking, MD;  Location: [facility];  Service: Endoscopy;  Laterality: N/A;    I have reviewed the social history and family history with the patient and they are unchanged from previous note.  ALLERGIES:  is allergic to oxaliplatin.  MEDICATIONS:  Current Outpatient Medications  Medication Sig Dispense Refill   aspirin EC 81 MG tablet Take 1 tablet (81 mg total) by mouth daily. 90 tablet 3   atorvastatin (LIPITOR) 20 MG tablet Take 20 mg by mouth at bedtime.   1   clindamycin (CLINDAGEL) 1 % gel Apply topically 2 (two) times daily. 60 g 2   clobetasol cream (TEMOVATE) 0.05 % Apply 1 application topically daily.     hydrochlorothiazide (MICROZIDE) 12.5 MG capsule Take 12.5 mg by mouth daily.     KLOR-CON M20 20 MEQ tablet TAKE 1 TABLET TWICE A DAY 180 tablet 3   lidocaine-prilocaine (EMLA) cream Apply 1 Application topically as needed. 30 g 1   magic mouthwash w/lidocaine SOLN Take 5 mLs by mouth 4 (four) times daily. 475 mL 2   MAGNESIUM-OXIDE 400 (240 Mg) MG tablet TAKE 2 TABLETS BY MOUTH 4 TIMES DAILY 240 tablet 0   omeprazole (PRILOSEC) 20 MG capsule Take 1 capsule (20 mg total) by mouth daily. (Patient not taking: Reported on 06/03/2022) 30 capsule 2   ondansetron (ZOFRAN) 8 MG tablet Take 1 tablet (8 mg total) by mouth every 8 (eight) hours as needed. 20 tablet 0   prochlorperazine (COMPAZINE) 10 MG tablet Take 1 tablet (10 mg total) by mouth every 6 (six) hours as needed for nausea or vomiting. 30 tablet 2   spironolactone (ALDACTONE) 25 MG tablet Take 25 mg by  mouth daily with breakfast.     urea (CARMOL) 10 % cream Apply topically 2 (two) times daily. 71 g 2   valACYclovir (VALTREX) 1000 MG tablet Take 1 tablet (1,000 mg total) by mouth 2 (two) times daily. 10 tablet 0   verapamil (CALAN) 120 MG tablet Take 120 mg by mouth 3 (three) times daily.     No current facility-administered medications for this visit.   Facility-Administered Medications Ordered in Other Visits  Medication Dose Route Frequency Provider Last Rate Last Admin   bevacizumab-awwb (MVASI) 400 mg in sodium chloride 0.9 % 100 mL chemo infusion  5 mg/kg (Treatment Plan Recorded) Intravenous Once Malachy Mood, MD       dexamethasone (DECADRON) 10 mg in sodium chloride 0.9 % 50 mL IVPB  10 mg Intravenous Once Malachy Mood, MD       fluorouracil (ADRUCIL) 5,000 mg in sodium chloride 0.9 % 150 mL chemo infusion  2,400 mg/m2 (Treatment Plan Recorded) Intravenous 1 day or 1 dose Malachy Mood, MD       heparin lock flush 100 unit/mL  500 Units Intracatheter Once PRN Malachy Mood, MD  irinotecan (CAMPTOSAR) 360 mg in sodium chloride 0.9 % 500 mL chemo infusion  180 mg/m2 (Treatment Plan Recorded) Intravenous Once Malachy Mood, MD       leucovorin 780 mg in sodium chloride 0.9 % 250 mL infusion  400 mg/m2 (Treatment Plan Recorded) Intravenous Once Malachy Mood, MD       palonosetron (ALOXI) injection 0.25 mg  0.25 mg Intravenous Once Malachy Mood, MD       sodium chloride flush (NS) 0.9 % injection 10 mL  10 mL Intracatheter PRN Malachy Mood, MD        PHYSICAL EXAMINATION: ECOG PERFORMANCE STATUS: 0 - Asymptomatic  Vitals:   08/26/22 1050  BP: (!) 140/89  Pulse: 69  Resp: 18  Temp: 97.7 F (36.5 C)  SpO2: 100%   Wt Readings from Last 3 Encounters:  08/26/22 181 lb 4.8 oz (82.2 kg)  08/12/22 182 lb 3.2 oz (82.6 kg)  07/28/22 181 lb 12.8 oz (82.5 kg)     GENERAL:alert, no distress and comfortable SKIN: skin color normal, no rashes or significant lesions EYES: normal, Conjunctiva are pink and  non-injected, sclera clear  NEURO: alert & oriented x 3 with fluent speech  LABORATORY DATA:  I have reviewed the data as listed    Latest Ref Rng & Units 08/26/2022   10:17 AM 08/12/2022    8:53 AM 07/28/2022   10:02 AM  CBC  WBC 4.0 - 10.5 K/uL 8.6  4.7  10.3   Hemoglobin 12.0 - 15.0 g/dL 16.1  09.6  04.5   Hematocrit 36.0 - 46.0 % 36.6  35.1  37.5   Platelets 150 - 400 K/uL 225  313  262         Latest Ref Rng & Units 08/26/2022   10:17 AM 08/12/2022    8:53 AM 07/28/2022   10:02 AM  CMP  Glucose 70 - 99 mg/dL 409  811  914   BUN 8 - 23 mg/dL 12  11  14    Creatinine 0.44 - 1.00 mg/dL 7.82  9.56  2.13   Sodium 135 - 145 mmol/L 138  139  139   Potassium 3.5 - 5.1 mmol/L 4.0  4.0  4.1   Chloride 98 - 111 mmol/L 105  105  103   CO2 22 - 32 mmol/L 27  28  29    Calcium 8.9 - 10.3 mg/dL 9.7  08.6  9.8   Total Protein 6.5 - 8.1 g/dL 7.1  7.6  7.6   Total Bilirubin 0.3 - 1.2 mg/dL 0.3  0.3  0.3   Alkaline Phos 38 - 126 U/L 105  75  118   AST 15 - 41 U/L 14  13  14    ALT 0 - 44 U/L 16  12  17        RADIOGRAPHIC STUDIES: I have personally reviewed the radiological images as listed and agreed with the findings in the report. No results found.    No orders of the defined types were placed in this encounter.  All questions were answered. The patient knows to call the clinic with any problems, questions or concerns. No barriers to learning was detected. The total time spent in the appointment was 25 minutes.     Malachy Mood, MD 08/26/2022   I, Sharlette Dense, CMA, am acting as scribe for Malachy Mood, MD.   I have reviewed the above documentation for accuracy and completeness, and I agree with the above.

## 2022-08-26 NOTE — Patient Instructions (Signed)
[facility]  Discharge Instructions: Thank you for choosing [facility] to provide your oncology and hematology care.   If you have a lab appointment with the Cancer Center, please go directly to the Cancer Center and check in at the registration area.   Wear comfortable clothing and clothing appropriate for easy access to any Portacath or PICC line.   We strive to give you quality time with your provider. You may need to reschedule your appointment if you arrive late (15 or more minutes).  Arriving late affects you and other patients whose appointments are after yours.  Also, if you miss three or more appointments without notifying the office, you may be dismissed from the clinic at the provider's discretion.      For prescription refill requests, have your pharmacy contact our office and allow 72 hours for refills to be completed.    Today you received the following chemotherapy and/or immunotherapy agents irinotecan, bevacizumab, fluorourcil      To help prevent nausea and vomiting after your treatment, we encourage you to take your nausea medication as directed.  BELOW ARE SYMPTOMS THAT SHOULD BE REPORTED IMMEDIATELY: *FEVER GREATER THAN 100.4 F (38 C) OR HIGHER *CHILLS OR SWEATING *NAUSEA AND VOMITING THAT IS NOT CONTROLLED WITH YOUR NAUSEA MEDICATION *UNUSUAL SHORTNESS OF BREATH *UNUSUAL BRUISING OR BLEEDING *URINARY PROBLEMS (pain or burning when urinating, or frequent urination) *BOWEL PROBLEMS (unusual diarrhea, constipation, pain near the anus) TENDERNESS IN MOUTH AND THROAT WITH OR WITHOUT PRESENCE OF ULCERS (sore throat, sores in mouth, or a toothache) UNUSUAL RASH, SWELLING OR PAIN  UNUSUAL VAGINAL DISCHARGE OR ITCHING   Items with * indicate a potential emergency and should be followed up as soon as possible or go to the Emergency Department if any problems should occur.  Please show the CHEMOTHERAPY ALERT CARD or  IMMUNOTHERAPY ALERT CARD at check-in to the Emergency Department and triage nurse.  Should you have questions after your visit or need to cancel or reschedule your appointment, please contact [facility]  Dept: [phone]  and follow the prompts.  Office hours are 8:00 a.m. to 4:30 p.m. Monday - Friday. Please note that voicemails left after 4:00 p.m. may not be returned until the following business day.  We are closed weekends and major holidays. You have access to a nurse at all times for urgent questions. Please call the main number to the clinic Dept: [phone] and follow the prompts.   For any non-urgent questions, you may also contact your provider using MyChart. We now offer e-Visits for anyone 18 and older to request care online for non-urgent symptoms. For details visit [email or web address].   Also download the MyChart app! Go to the app store, search "MyChart", open the app, select [place], and log in with your MyChart username and password.   

## 2022-08-28 ENCOUNTER — Inpatient Hospital Stay: Payer: Medicare (Managed Care)

## 2022-08-28 VITALS — BP 123/74 | HR 60 | Temp 98.3°F | Resp 16

## 2022-08-28 DIAGNOSIS — Z5112 Encounter for antineoplastic immunotherapy: Secondary | ICD-10-CM | POA: Diagnosis not present

## 2022-08-28 DIAGNOSIS — Z95828 Presence of other vascular implants and grafts: Secondary | ICD-10-CM

## 2022-08-28 DIAGNOSIS — C221 Intrahepatic bile duct carcinoma: Secondary | ICD-10-CM

## 2022-08-28 MED ORDER — PEGFILGRASTIM-CBQV 6 MG/0.6ML ~~LOC~~ SOSY
6.0000 mg | PREFILLED_SYRINGE | Freq: Once | SUBCUTANEOUS | Status: AC
  Administered 2022-08-28: 6 mg via SUBCUTANEOUS
  Filled 2022-08-28: qty 0.6

## 2022-08-28 MED ORDER — SODIUM CHLORIDE 0.9% FLUSH
10.0000 mL | Freq: Once | INTRAVENOUS | Status: AC
  Administered 2022-08-28: 10 mL

## 2022-08-28 MED ORDER — HEPARIN SOD (PORK) LOCK FLUSH 100 UNIT/ML IV SOLN
500.0000 [IU] | Freq: Once | INTRAVENOUS | Status: AC
  Administered 2022-08-28: 500 [IU]

## 2022-08-31 ENCOUNTER — Ambulatory Visit (HOSPITAL_COMMUNITY)
Admission: RE | Admit: 2022-08-31 | Discharge: 2022-08-31 | Disposition: A | Discharge status: Home / Self Care | Payer: Medicare (Managed Care) | Source: Ambulatory Visit | Attending: Hematology | Admitting: Hematology

## 2022-08-31 DIAGNOSIS — C221 Intrahepatic bile duct carcinoma: Secondary | ICD-10-CM | POA: Insufficient documentation

## 2022-08-31 MED ORDER — HEPARIN SOD (PORK) LOCK FLUSH 100 UNIT/ML IV SOLN
INTRAVENOUS | Status: AC
  Filled 2022-08-31: qty 5

## 2022-08-31 MED ORDER — IOHEXOL 300 MG/ML  SOLN
100.0000 mL | Freq: Once | INTRAMUSCULAR | Status: AC | PRN
  Administered 2022-08-31: 100 mL via INTRAVENOUS

## 2022-08-31 MED ORDER — IOHEXOL 9 MG/ML PO SOLN
1000.0000 mL | ORAL | Status: AC
  Administered 2022-08-31: 1000 mL via ORAL

## 2022-08-31 MED ORDER — IOHEXOL 9 MG/ML PO SOLN
ORAL | Status: AC
  Filled 2022-08-31: qty 1000

## 2022-08-31 MED ORDER — SODIUM CHLORIDE (PF) 0.9 % IJ SOLN
INTRAMUSCULAR | Status: AC
  Filled 2022-08-31: qty 50

## 2022-09-08 MED FILL — Dexamethasone Sodium Phosphate Inj 100 MG/10ML: INTRAMUSCULAR | Qty: 1 | Status: AC

## 2022-09-08 NOTE — Assessment & Plan Note (Signed)
MSS, [name]/[name] wildtype -diagnosed 12/2019 by porta hepatis LN biopsy during EUS for work up of abdominal pain and liver lesions seen on [place] and MRI. Liver biopsy 01/08/20 confirmed metastasis from primary colorectal cancer. PET scan showed hypermetabolism to known liver mets, diffuse thoracic and abdominal lymphadenopathy, a 1.8 cm LUL pulmonary nodule, and splenic flexure of colon. -treated with first line FOLFOX 01/25/20 - 10/02/20, Vectibix added with C2. Oxali discontinued after C18 due to reaction. -switched to Xeloda 10/21/20, dose adjusted due to significant skin toxicity -due to cancer progression on recent staging scan, treatment has been changed to FOLFIRI and bevacizumab on 03/25/22, she is overall tolerating well -will repeat CT scan after next cycle chemo  -Restaging CT scan from 06/16/2022 showed stable to mild decrease in size of treated liver metastasis. No new lesions -She is tolerating chemotherapy well overall, will continue -Her restaging CT scan on August 31, 2022 showed stable liver metastasis, and a stable 7 mm left lung nodule, indeterminate.  I personally reviewed the scan images with patient -Will continue current therapy.  We discussed the option of maintenance therapy with irinotecan and bevacizumab in future.  She is tolerating current FOLFIRI and bevacizumab well, will continue.

## 2022-09-09 ENCOUNTER — Other Ambulatory Visit: Payer: Self-pay

## 2022-09-09 ENCOUNTER — Inpatient Hospital Stay: Payer: Medicare (Managed Care)

## 2022-09-09 ENCOUNTER — Inpatient Hospital Stay: Payer: Medicare (Managed Care) | Attending: Hematology | Admitting: Hematology

## 2022-09-09 ENCOUNTER — Encounter: Payer: Self-pay | Admitting: Hematology

## 2022-09-09 VITALS — BP 132/86 | HR 70 | Temp 98.2°F | Resp 18 | Ht 66.0 in | Wt 181.4 lb

## 2022-09-09 DIAGNOSIS — Z79899 Other long term (current) drug therapy: Secondary | ICD-10-CM | POA: Diagnosis not present

## 2022-09-09 DIAGNOSIS — Z5189 Encounter for other specified aftercare: Secondary | ICD-10-CM | POA: Insufficient documentation

## 2022-09-09 DIAGNOSIS — C221 Intrahepatic bile duct carcinoma: Secondary | ICD-10-CM

## 2022-09-09 DIAGNOSIS — Z5111 Encounter for antineoplastic chemotherapy: Secondary | ICD-10-CM | POA: Insufficient documentation

## 2022-09-09 DIAGNOSIS — Z90710 Acquired absence of both cervix and uterus: Secondary | ICD-10-CM | POA: Insufficient documentation

## 2022-09-09 DIAGNOSIS — Z95828 Presence of other vascular implants and grafts: Secondary | ICD-10-CM

## 2022-09-09 DIAGNOSIS — C787 Secondary malignant neoplasm of liver and intrahepatic bile duct: Secondary | ICD-10-CM | POA: Insufficient documentation

## 2022-09-09 DIAGNOSIS — G620 Drug-induced polyneuropathy: Secondary | ICD-10-CM | POA: Insufficient documentation

## 2022-09-09 DIAGNOSIS — C19 Malignant neoplasm of rectosigmoid junction: Secondary | ICD-10-CM | POA: Diagnosis present

## 2022-09-09 DIAGNOSIS — Z5112 Encounter for antineoplastic immunotherapy: Secondary | ICD-10-CM | POA: Insufficient documentation

## 2022-09-09 DIAGNOSIS — C189 Malignant neoplasm of colon, unspecified: Secondary | ICD-10-CM

## 2022-09-09 DIAGNOSIS — T451X5D Adverse effect of antineoplastic and immunosuppressive drugs, subsequent encounter: Secondary | ICD-10-CM | POA: Insufficient documentation

## 2022-09-09 DIAGNOSIS — C7800 Secondary malignant neoplasm of unspecified lung: Secondary | ICD-10-CM | POA: Diagnosis not present

## 2022-09-09 LAB — CBC WITH DIFFERENTIAL (CANCER CENTER ONLY)
Abs Immature Granulocytes: 0.05 10*3/uL (ref 0.00–0.07)
Basophils Absolute: 0 10*3/uL (ref 0.0–0.1)
Basophils Relative: 0 %
Eosinophils Absolute: 0.2 10*3/uL (ref 0.0–0.5)
Eosinophils Relative: 2 %
HCT: 37.5 % (ref 36.0–46.0)
Hemoglobin: 12.2 g/dL (ref 12.0–15.0)
Immature Granulocytes: 1 %
Lymphocytes Relative: 17 %
Lymphs Abs: 1.4 10*3/uL (ref 0.7–4.0)
MCH: 30.1 pg (ref 26.0–34.0)
MCHC: 32.5 g/dL (ref 30.0–36.0)
MCV: 92.6 fL (ref 80.0–100.0)
Monocytes Absolute: 0.6 10*3/uL (ref 0.1–1.0)
Monocytes Relative: 7 %
Neutro Abs: 6.4 10*3/uL (ref 1.7–7.7)
Neutrophils Relative %: 73 %
Platelet Count: 273 10*3/uL (ref 150–400)
RBC: 4.05 MIL/uL (ref 3.87–5.11)
RDW: 17.6 % — ABNORMAL HIGH (ref 11.5–15.5)
WBC Count: 8.7 10*3/uL (ref 4.0–10.5)
nRBC: 0 % (ref 0.0–0.2)

## 2022-09-09 LAB — CMP (CANCER CENTER ONLY)
ALT: 16 U/L (ref 0–44)
AST: 13 U/L — ABNORMAL LOW (ref 15–41)
Albumin: 3.9 g/dL (ref 3.5–5.0)
Alkaline Phosphatase: 108 U/L (ref 38–126)
Anion gap: 7 (ref 5–15)
BUN: 9 mg/dL (ref 8–23)
CO2: 28 mmol/L (ref 22–32)
Calcium: 9.9 mg/dL (ref 8.9–10.3)
Chloride: 103 mmol/L (ref 98–111)
Creatinine: 0.64 mg/dL (ref 0.44–1.00)
GFR, Estimated: >60 mL/min (ref >60)
Glucose, Bld: 121 mg/dL — ABNORMAL HIGH (ref 70–99)
Potassium: 3.9 mmol/L (ref 3.5–5.1)
Sodium: 138 mmol/L (ref 135–145)
Total Bilirubin: 0.3 mg/dL (ref 0.3–1.2)
Total Protein: 6.9 g/dL (ref 6.5–8.1)

## 2022-09-09 LAB — CEA (ACCESS): CEA (CHCC): 3.52 ng/mL (ref 0.00–5.00)

## 2022-09-09 LAB — TOTAL PROTEIN, URINE DIPSTICK: Protein, ur: NEGATIVE mg/dL

## 2022-09-09 LAB — MAGNESIUM: Magnesium: 1.7 mg/dL (ref 1.7–2.4)

## 2022-09-09 MED ORDER — SODIUM CHLORIDE 0.9 % IV SOLN
180.0000 mg/m2 | Freq: Once | INTRAVENOUS | Status: AC
  Administered 2022-09-09: 360 mg via INTRAVENOUS
  Filled 2022-09-09: qty 17.04

## 2022-09-09 MED ORDER — SODIUM CHLORIDE 0.9% FLUSH: 10.0000 mL | INTRAVENOUS | Status: DC | PRN

## 2022-09-09 MED ORDER — SODIUM CHLORIDE 0.9 % IV SOLN
2400.0000 mg/m2 | INTRAVENOUS | Status: DC
  Administered 2022-09-09: 5000 mg via INTRAVENOUS
  Filled 2022-09-09: qty 100

## 2022-09-09 MED ORDER — SODIUM CHLORIDE 0.9 % IV SOLN: Freq: Once | INTRAVENOUS | Status: AC

## 2022-09-09 MED ORDER — SODIUM CHLORIDE 0.9 % IV SOLN: Freq: Once | INTRAVENOUS | Status: DC

## 2022-09-09 MED ORDER — SODIUM CHLORIDE 0.9 % IV SOLN
400.0000 mg/m2 | Freq: Once | INTRAVENOUS | Status: AC
  Administered 2022-09-09: 780 mg via INTRAVENOUS
  Filled 2022-09-09: qty 25

## 2022-09-09 MED ORDER — HEPARIN SOD (PORK) LOCK FLUSH 100 UNIT/ML IV SOLN: 500.0000 [IU] | Freq: Once | INTRAVENOUS | Status: DC | PRN

## 2022-09-09 MED ORDER — SODIUM CHLORIDE 0.9 % IV SOLN
10.0000 mg | Freq: Once | INTRAVENOUS | Status: AC
  Administered 2022-09-09: 10 mg via INTRAVENOUS
  Filled 2022-09-09: qty 10

## 2022-09-09 MED ORDER — UREA 10 % EX CREA: TOPICAL_CREAM | Freq: Two times a day (BID) | CUTANEOUS | 2 refills | Status: DC

## 2022-09-09 MED ORDER — ATROPINE SULFATE 1 MG/ML IV SOLN
0.5000 mg | Freq: Once | INTRAVENOUS | Status: AC | PRN
  Administered 2022-09-09: 0.5 mg via INTRAVENOUS
  Filled 2022-09-09: qty 1

## 2022-09-09 MED ORDER — SODIUM CHLORIDE 0.9 % IV SOLN
5.0000 mg/kg | Freq: Once | INTRAVENOUS | Status: AC
  Administered 2022-09-09: 400 mg via INTRAVENOUS
  Filled 2022-09-09: qty 16

## 2022-09-09 MED ORDER — SODIUM CHLORIDE 0.9% FLUSH
10.0000 mL | Freq: Once | INTRAVENOUS | Status: AC
  Administered 2022-09-09: 10 mL

## 2022-09-09 MED ORDER — PALONOSETRON HCL INJECTION 0.25 MG/5ML
0.2500 mg | Freq: Once | INTRAVENOUS | Status: AC
  Administered 2022-09-09: 0.25 mg via INTRAVENOUS
  Filled 2022-09-09: qty 5

## 2022-09-09 NOTE — Patient Instructions (Addendum)
[facility]  Discharge Instructions: Thank you for choosing [facility] to provide your oncology and hematology care.   If you have a lab appointment with the Cancer Center, please go directly to the Cancer Center and check in at the registration area.   Wear comfortable clothing and clothing appropriate for easy access to any Portacath or PICC line.   We strive to give you quality time with your provider. You may need to reschedule your appointment if you arrive late (15 or more minutes).  Arriving late affects you and other patients whose appointments are after yours.  Also, if you miss three or more appointments without notifying the office, you may be dismissed from the clinic at the provider's discretion.      For prescription refill requests, have your pharmacy contact our office and allow 72 hours for refills to be completed.    Today you received the following chemotherapy and/or immunotherapy agents : Bevacizumab, Irinotecan, Leucovorin, 5FU      To help prevent nausea and vomiting after your treatment, we encourage you to take your nausea medication as directed.  BELOW ARE SYMPTOMS THAT SHOULD BE REPORTED IMMEDIATELY: *FEVER GREATER THAN 100.4 F (38 C) OR HIGHER *CHILLS OR SWEATING *NAUSEA AND VOMITING THAT IS NOT CONTROLLED WITH YOUR NAUSEA MEDICATION *UNUSUAL SHORTNESS OF BREATH *UNUSUAL BRUISING OR BLEEDING *URINARY PROBLEMS (pain or burning when urinating, or frequent urination) *BOWEL PROBLEMS (unusual diarrhea, constipation, pain near the anus) TENDERNESS IN MOUTH AND THROAT WITH OR WITHOUT PRESENCE OF ULCERS (sore throat, sores in mouth, or a toothache) UNUSUAL RASH, SWELLING OR PAIN  UNUSUAL VAGINAL DISCHARGE OR ITCHING   Items with * indicate a potential emergency and should be followed up as soon as possible or go to the Emergency Department if any problems should occur.  Please show the CHEMOTHERAPY ALERT CARD or  IMMUNOTHERAPY ALERT CARD at check-in to the Emergency Department and triage nurse.  Should you have questions after your visit or need to cancel or reschedule your appointment, please contact [facility]  Dept: [phone]  and follow the prompts.  Office hours are 8:00 a.m. to 4:30 p.m. Monday - Friday. Please note that voicemails left after 4:00 p.m. may not be returned until the following business day.  We are closed weekends and major holidays. You have access to a nurse at all times for urgent questions. Please call the main number to the clinic Dept: [phone] and follow the prompts.   For any non-urgent questions, you may also contact your provider using MyChart. We now offer e-Visits for anyone 18 and older to request care online for non-urgent symptoms. For details visit [email or web address].   Also download the MyChart app! Go to the app store, search "MyChart", open the app, select [place], and log in with your MyChart username and password.  

## 2022-09-09 NOTE — Progress Notes (Unsigned)
[facility]   [phone] [phone]   Clinic Follow up Note   Patient Care Team: Ralene Ok, MD as PCP - General (Internal Medicine) Radonna Ricker, RN (Inactive) as Oncology Nurse Navigator Malachy Mood, MD as Consulting Physician (Oncology) Jeani Hawking, MD as Consulting Physician (Gastroenterology)  Date of Service:  09/09/2022  CHIEF COMPLAINT: f/u of metastatic colon cancer   CURRENT THERAPY:  FOLFIRI/Beva q14 days, started 03/25/22     ASSESSMENT: *** Lindsay [name] is a 67 y.o. female with   metastatic colon 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 Liver biopsy 01/08/20 confirmed metastasis from primary colorectal cancer. PET scan showed hypermetabolism to known liver mets, diffuse thoracic and abdominal lymphadenopathy, a 1.8 cm LUL pulmonary nodule, and splenic flexure of colon. -treated with first line FOLFOX 01/25/20 - 10/02/20, Vectibix added with C2. Oxali discontinued after C18 due to reaction. -switched to Xeloda 10/21/20, dose adjusted due to significant skin toxicity -due to cancer progression on recent staging scan, treatment has been changed to FOLFIRI and bevacizumab on 03/25/22, she is overall tolerating well -will repeat CT scan after next cycle chemo  -Restaging CT scan from 06/16/2022 showed stable to mild decrease in size of treated liver metastasis. No new lesions -She is tolerating chemotherapy well overall, will continue -Her restaging CT scan on August 31, 2022 showed stable liver metastasis, and a stable 7 mm left lung nodule, indeterminate.  I personally reviewed the scan images with patient -Will continue current therapy.  We discussed the option of maintenance therapy with irinotecan and bevacizumab in future.  She is tolerating current FOLFIRI and bevacizumab well, will  continue.     PLAN: -reviewed CT scan- stable -lab reviewed -proceed with Folfiri today  -lab/flush and treatment 7/17  SUMMARY OF ONCOLOGIC HISTORY: Oncology History  metastatic colon cancer to liver  06/20/2019 Procedure   Colonoscopy by Dr Meridee Score 06/20/19  IMPRESSION -Seven 3 to 10 mm polyps in the sigmoid colon, in the transverse colon and in the escending colon, removed with a cold snare. Resected and retrireved.  -One 20mm polyp in the descending colon. Biopsies. Tattoes.  -Mediaum sized lipoma in the ascending colon.   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 See Comment B. Colon, Ascending, polyp, Polypectomy:  -TUBULAR ADENOMA -No high grade dysplasia or malignancy.  C. Colon, TRansverse, Polyo, polectomy:  -TUBULAR ADENOMA -No high grade dysplasia or malignancy.  D. Colon, Sigmoid, Polyp, Polypectomy:  -HYPERPLASTIC POLYP   11/07/2019 Imaging   US Abdomen 11/07/19  IMPRESSION: 1. Two solid masses in the liver are nonspecific. Recommend MRI abdomen with and without contrast for further evaluation.   12/15/2019 Imaging   MRI Abdomen 12/15/19  IMPRESSION: 1. There are two large masses in the liver with appearance favoring metastatic disease or hepatocellular carcinoma or cholangiocarcinoma. A benign etiology is highly unlikely given the enhancement pattern and associated adenopathy. 2. Considerable porta hepatis and retroperitoneal adenopathy. Some of the confluent porta hepatis tumor is potentially infiltrative and abuts the pancreatic body along its right upper margin, making it difficult to completely exclude the possibility of pancreatic adenocarcinoma primary. Possibilities helpful in further workup might include tissue diagnosis, endoscopic ultrasound, or nuclear medicine PET-CT. 3. Pancreas divisum. 4. Lumbar spondylosis and degenerative disc disease. 5. Despite efforts  by the technologist  and patient, motion artifact is present on today's exam and could not be eliminated. This reduces exam sensitivity and specificity.   12/22/2019 Procedure   Upper Endoscopy by Dr Elnoria Howard 12/22/19  IMPRESSION - One lymph node was visualized and measured in the porta hepatis region. Fine needle aspiration performed    12/22/2019 Initial Biopsy   A. LIVER, PORTA HEPATIS MASS, FINE NEEDLE 12/22/19 ASPIRATION:  FINAL MICROSCOPIC DIAGNOSIS:  - Malignant cells consistent with metastatic adenocarcinoma   01/01/2020 Initial Diagnosis   Intrahepatic cholangiocarcinoma (HCC)   01/08/2020 Initial Biopsy   FINAL MICROSCOPIC DIAGNOSIS:   A. LIVER, LEFT LOBE, BIOPSY:  - Metastatic adenocarcinoma, consistent with a colorectal primary.  See  comment      COMMENT:   Immunohistochemical stains show the tumor cells are positive for CK20  and CDX2 but negative for CK7, consistent with above interpretation.  Dr. Mosetta Putt was notified on 01/10/2020   01/08/2020 Genetic Testing   [name]  [name] wildtype and [name]/[name] mutations which make her eligible for target biological agent Vectibix.   01/24/2020 Procedure   PAC placed 01/24/20   01/25/2020 -  Chemotherapy   first line FOLFOX starting 01/25/2020, Vextibix  added with C2 (02/06/20)   02/06/2020 - 02/06/2022 Chemotherapy   Patient is on Treatment Plan : COLORECTAL Panitumumab q14d ([name] Wild - Type Gene Only)     06/20/2020 Imaging   CT CAP  IMPRESSION: 1. Continued interval reduction in size and conspicuity of a subsolid nodule of the peripheral left upper lobe. 2. Unchanged prominent pretracheal and subcarinal lymph nodes. 3. Redemonstrated partially calcified low-attenuation liver masses, slightly decreased in size compared to prior examination. 4. Slight interval decrease in size of a portacaval lymph node or conglomerate and retroperitoneal lymph nodes. 5. Findings are consistent with continued treatment  response of nodal, pulmonary, and hepatic metastatic disease. 6. Coronary artery disease.   Aortic Atherosclerosis (ICD10-I70.0).     10/09/2020 Imaging   IMPRESSION: 1. Slight decrease in size of dominant liver mass with smaller liver mass within 1-2 mm of prior measurement. 2. Stable appearance of celiac lymph and retroperitoneal lymph nodes. Dominant node with calcification in the gastrohepatic ligament as described. 3. Continued decrease in size of LEFT upper lobe nodule. 4. Three-vessel coronary artery calcification. 5. Aortic atherosclerosis.   03/25/2022 -  Chemotherapy   Patient is on Treatment Plan : COLORECTAL FOLFIRI + Bevacizumab q14d        INTERVAL HISTORY:  AVEEN FLOCK is here for a follow up of metastatic colon cancer . She was last seen by me on 08/26/2022. She presents to the clinic alone. Pt  state that  she is doing well. She also state that her BM is a lot better. Pt state that she has some discoloration on the bottom of her feet and hands. Pt state that her appetite is good and she has no fatigue. She state that she plans to take a vacation in 10/2022   All other systems were reviewed with the patient and are negative.  MEDICAL HISTORY:  Past Medical History:  Diagnosis Date   Arthritis    Diabetes mellitus without complication (HCC)    Hypertension    met colon ca to liver 01/2020    SURGICAL HISTORY: Past Surgical History:  Procedure Laterality Date   ABDOMINAL HYSTERECTOMY     ANTERIOR AND POSTERIOR REPAIR WITH SACROSPINOUS FIXATION N/A 07/16/2016   Procedure: ANTERIOR AND POSTERIOR REPAIR WITH SACROSPINOUS FIXATION;  Surgeon: Harold Hedge, MD;  Location: [facility]  ORS;  Service: Gynecology;  Laterality: N/A;   CYSTOSCOPY  07/16/2016   Procedure: CYSTOSCOPY;  Surgeon: Harold Hedge, MD;  Location: [facility] ORS;  Service: Gynecology;;   ESOPHAGOGASTRODUODENOSCOPY (EGD) WITH PROPOFOL N/A 12/22/2019   Procedure: ESOPHAGOGASTRODUODENOSCOPY (EGD) WITH PROPOFOL;   Surgeon: Jeani Hawking, MD;  Location: [facility] ENDOSCOPY;  Service: Endoscopy;  Laterality: N/A;   FINE NEEDLE ASPIRATION N/A 12/22/2019   Procedure: FINE NEEDLE ASPIRATION (FNA) LINEAR;  Surgeon: Jeani Hawking, MD;  Location: [facility] ENDOSCOPY;  Service: Endoscopy;  Laterality: N/A;   IR IMAGING GUIDED PORT INSERTION  01/24/2020   LAPAROSCOPIC VAGINAL HYSTERECTOMY WITH SALPINGECTOMY Bilateral 07/16/2016   Procedure: LAPAROSCOPIC ASSISTED VAGINAL HYSTERECTOMY WITH SALPINGECTOMY;  Surgeon: Harold Hedge, MD;  Location: [facility] ORS;  Service: Gynecology;  Laterality: Bilateral;   MYOMECTOMY ABDOMINAL APPROACH     THYROID SURGERY     tyroid     UPPER ESOPHAGEAL ENDOSCOPIC ULTRASOUND (EUS) N/A 12/22/2019   Procedure: UPPER ESOPHAGEAL ENDOSCOPIC ULTRASOUND (EUS);  Surgeon: Jeani Hawking, MD;  Location: [facility];  Service: Endoscopy;  Laterality: N/A;    I have reviewed the social history and family history with the patient and they are unchanged from previous note.  ALLERGIES:  is allergic to oxaliplatin.  MEDICATIONS:  Current Outpatient Medications  Medication Sig Dispense Refill   aspirin EC 81 MG tablet Take 1 tablet (81 mg total) by mouth daily. 90 tablet 3   atorvastatin (LIPITOR) 20 MG tablet Take 20 mg by mouth at bedtime.   1   clindamycin (CLINDAGEL) 1 % gel Apply topically 2 (two) times daily. 60 g 2   clobetasol cream (TEMOVATE) 0.05 % Apply 1 application topically daily.     hydrochlorothiazide (MICROZIDE) 12.5 MG capsule Take 12.5 mg by mouth daily.     KLOR-CON M20 20 MEQ tablet TAKE 1 TABLET TWICE A DAY 180 tablet 3   lidocaine-prilocaine (EMLA) cream Apply 1 Application topically as needed. 30 g 1   magic mouthwash w/lidocaine SOLN Take 5 mLs by mouth 4 (four) times daily. 475 mL 2   MAGNESIUM-OXIDE 400 (240 Mg) MG tablet TAKE 2 TABLETS BY MOUTH 4 TIMES DAILY 240 tablet 0   omeprazole (PRILOSEC) 20 MG capsule Take 1 capsule (20 mg total) by mouth daily. (Patient not taking: Reported on  06/03/2022) 30 capsule 2   ondansetron (ZOFRAN) 8 MG tablet Take 1 tablet (8 mg total) by mouth every 8 (eight) hours as needed. 20 tablet 0   prochlorperazine (COMPAZINE) 10 MG tablet Take 1 tablet (10 mg total) by mouth every 6 (six) hours as needed for nausea or vomiting. 30 tablet 2   spironolactone (ALDACTONE) 25 MG tablet Take 25 mg by mouth daily with breakfast.     urea (CARMOL) 10 % cream Apply topically 2 (two) times daily. 71 g 2   valACYclovir (VALTREX) 1000 MG tablet Take 1 tablet (1,000 mg total) by mouth 2 (two) times daily. 10 tablet 0   verapamil (CALAN) 120 MG tablet Take 120 mg by mouth 3 (three) times daily.     No current facility-administered medications for this visit.    PHYSICAL EXAMINATION: ECOG PERFORMANCE STATUS: {CHL ONC ECOG ZO:[id number]}  Vitals:   09/09/22 0937  BP: 132/86  Pulse: 70  Resp: 18  Temp: 98.2 F (36.8 C)  SpO2: 97%   Wt Readings from Last 3 Encounters:  09/09/22 181 lb 6.4 oz (82.3 kg)  08/26/22 181 lb 4.8 oz (82.2 kg)  08/12/22 182 lb 3.2 oz (82.6 kg)    {  Only keep what was examined. If exam not performed, can use .CEXAM } GENERAL:alert, no distress and comfortable SKIN: skin color, texture, turgor are normal, no rashes or significant lesions EYES: normal, Conjunctiva are pink and non-injected, sclera clear {OROPHARYNX:no exudate, no erythema and lips, buccal mucosa, and tongue normal}  NECK: supple, thyroid normal size, non-tender, without nodularity LYMPH:  no palpable lymphadenopathy in the cervical, axillary {or inguinal} LUNGS: clear to auscultation and percussion with normal breathing effort HEART: regular rate & rhythm and no murmurs and no lower extremity edema ABDOMEN:abdomen soft, non-tender and normal bowel sounds Musculoskeletal:no cyanosis of digits and no clubbing  NEURO: alert & oriented x 3 with fluent speech, no focal motor/sensory deficits  LABORATORY DATA:  I have reviewed the data as listed    Latest Ref Rng  & Units 09/09/2022    9:08 AM 08/26/2022   10:17 AM 08/12/2022    8:53 AM  CBC  WBC 4.0 - 10.5 K/uL 8.7  8.6  4.7   Hemoglobin 12.0 - 15.0 g/dL 16.1  09.6  04.5   Hematocrit 36.0 - 46.0 % 37.5  36.6  35.1   Platelets 150 - 400 K/uL 273  225  313         Latest Ref Rng & Units 09/09/2022    9:08 AM 08/26/2022   10:17 AM 08/12/2022    8:53 AM  CMP  Glucose 70 - 99 mg/dL 409  811  914   BUN 8 - 23 mg/dL 9  12  11    Creatinine 0.44 - 1.00 mg/dL 7.82  9.56  2.13   Sodium 135 - 145 mmol/L 138  138  139   Potassium 3.5 - 5.1 mmol/L 3.9  4.0  4.0   Chloride 98 - 111 mmol/L 103  105  105   CO2 22 - 32 mmol/L 28  27  28    Calcium 8.9 - 10.3 mg/dL 9.9  9.7  08.6   Total Protein 6.5 - 8.1 g/dL 6.9  7.1  7.6   Total Bilirubin 0.3 - 1.2 mg/dL 0.3  0.3  0.3   Alkaline Phos 38 - 126 U/L 108  105  75   AST 15 - 41 U/L 13  14  13    ALT 0 - 44 U/L 16  16  12        RADIOGRAPHIC STUDIES: I have personally reviewed the radiological images as listed and agreed with the findings in the report. No results found.    Orders Placed This Encounter  Procedures   CBC with Differential (Cancer Center Only)    Standing Status:   Future    Standing Expiration Date:   10/21/2023   CMP (Cancer Center only)    Standing Status:   Future    Standing Expiration Date:   10/21/2023   Total Protein, Urine dipstick    Standing Status:   Future    Standing Expiration Date:   10/21/2023   CBC with Differential (Cancer Center Only)    Standing Status:   Future    Standing Expiration Date:   11/04/2023   CMP (Cancer Center only)    Standing Status:   Future    Standing Expiration Date:   11/04/2023   Total Protein, Urine dipstick    Standing Status:   Future    Standing Expiration Date:   11/04/2023   All questions were answered. The patient knows to call the clinic with any problems, questions or concerns. No barriers to learning was  detected. The total time spent in the appointment was {CHL ONC TIME VISIT -  ZOXWR:[id number]}.     Salome Holmes, CMA 09/09/2022   I, Monica Martinez, CMA, am acting as scribe for Malachy Mood, MD.   {Add scribe attestation statement}

## 2022-09-10 ENCOUNTER — Encounter: Payer: Self-pay | Admitting: Hematology

## 2022-09-11 ENCOUNTER — Other Ambulatory Visit: Payer: Self-pay

## 2022-09-11 ENCOUNTER — Inpatient Hospital Stay: Payer: Medicare (Managed Care)

## 2022-09-11 DIAGNOSIS — Z5112 Encounter for antineoplastic immunotherapy: Secondary | ICD-10-CM | POA: Diagnosis not present

## 2022-09-11 DIAGNOSIS — C221 Intrahepatic bile duct carcinoma: Secondary | ICD-10-CM

## 2022-09-11 MED ORDER — HEPARIN SOD (PORK) LOCK FLUSH 100 UNIT/ML IV SOLN
500.0000 [IU] | Freq: Once | INTRAVENOUS | Status: AC | PRN
  Administered 2022-09-11: 500 [IU]

## 2022-09-11 MED ORDER — SODIUM CHLORIDE 0.9% FLUSH
10.0000 mL | INTRAVENOUS | Status: DC | PRN
  Administered 2022-09-11: 10 mL

## 2022-09-11 MED ORDER — PEGFILGRASTIM-CBQV 6 MG/0.6ML ~~LOC~~ SOSY
6.0000 mg | PREFILLED_SYRINGE | Freq: Once | SUBCUTANEOUS | Status: AC
  Administered 2022-09-11: 6 mg via SUBCUTANEOUS
  Filled 2022-09-11: qty 0.6

## 2022-09-12 ENCOUNTER — Other Ambulatory Visit: Payer: Self-pay

## 2022-09-22 NOTE — Assessment & Plan Note (Signed)
-  overall mild and stable

## 2022-09-22 NOTE — Progress Notes (Unsigned)
[facility]   [phone] Fax:([phone]   Clinic Follow up Note   Patient Care Team: Ralene Ok, MD as PCP - General (Internal Medicine) Radonna Ricker, RN (Inactive) as Oncology Nurse Navigator Malachy Mood, MD as Consulting Physician (Oncology) Jeani Hawking, MD as Consulting Physician (Gastroenterology)  Date of Service:  09/23/2022  CHIEF COMPLAINT: f/u of metastatic colon cancer   CURRENT THERAPY:  FOLFIRI/Beva q14 days, started 03/25/22   ASSESSMENT:  Lindsay [name] is a 67 y.o. female with   metastatic colon 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 Liver biopsy 01/08/20 confirmed metastasis from primary colorectal cancer. PET scan showed hypermetabolism to known liver mets, diffuse thoracic and abdominal lymphadenopathy, a 1.8 cm LUL pulmonary nodule, and splenic flexure of colon. -treated with first line FOLFOX 01/25/20 - 10/02/20, Vectibix added with C2. Oxali discontinued after C18 due to reaction. -switched to Xeloda 10/21/20, dose adjusted due to significant skin toxicity -due to cancer progression on recent staging scan, treatment has been changed to FOLFIRI and bevacizumab on 03/25/22, she is overall tolerating well -will repeat CT scan after next cycle chemo  -Restaging CT scan from 06/16/2022 showed stable to mild decrease in size of treated liver metastasis. No new lesions -She is tolerating chemotherapy well overall, will continue -Her restaging CT scan on August 31, 2022 showed stable liver metastasis, and a stable 7 mm left lung nodule, indeterminate.  I personally reviewed the scan images with patient -Will continue current therapy.  We discussed the option of maintenance therapy with irinotecan and bevacizumab in future.  She is tolerating current FOLFIRI and bevacizumab well, will continue.    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 Resected and retrireved.  -One 20mm polyp in the descending colon. Biopsies. Tattoes.  -Mediaum sized lipoma in the ascending colon.   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 See Comment B. Colon, Ascending, polyp, Polypectomy:  -TUBULAR ADENOMA -No high grade dysplasia or malignancy.  C. Colon, TRansverse, Polyo, polectomy:  -TUBULAR ADENOMA -No high grade dysplasia or malignancy.  D. Colon, Sigmoid, Polyp, Polypectomy:  -HYPERPLASTIC POLYP   11/07/2019 Imaging   US Abdomen 11/07/19  IMPRESSION: 1. Two solid masses in the liver are nonspecific. Recommend MRI abdomen with and without contrast for further evaluation.   12/15/2019 Imaging   MRI Abdomen 12/15/19  IMPRESSION: 1. There are two large masses in the liver with appearance favoring metastatic disease or hepatocellular carcinoma or cholangiocarcinoma. A benign etiology is highly unlikely given the enhancement pattern and associated adenopathy. 2. Considerable porta hepatis and retroperitoneal adenopathy. Some of the confluent porta hepatis tumor is potentially infiltrative and abuts the pancreatic body along its right upper margin, making it difficult to completely exclude the possibility of pancreatic adenocarcinoma primary. Possibilities helpful in further workup might include tissue diagnosis, endoscopic ultrasound, or nuclear medicine PET-CT. 3. Pancreas divisum. 4. Lumbar spondylosis and degenerative disc  disease. 5. Despite efforts by the technologist and patient, motion artifact is present on today's exam and could not be eliminated. This reduces exam sensitivity and specificity.   12/22/2019 Procedure   Upper Endoscopy by Dr Elnoria Howard 12/22/19  IMPRESSION - One lymph node was visualized and measured in the porta hepatis region. Fine needle aspiration performed    12/22/2019 Initial Biopsy   A. LIVER, PORTA HEPATIS MASS, FINE NEEDLE 12/22/19 ASPIRATION:  FINAL MICROSCOPIC DIAGNOSIS:  - Malignant cells consistent with metastatic adenocarcinoma   01/01/2020 Initial Diagnosis   Intrahepatic cholangiocarcinoma (HCC)   01/08/2020 Initial Biopsy   FINAL MICROSCOPIC DIAGNOSIS:   A. LIVER, LEFT LOBE, BIOPSY:  - Metastatic adenocarcinoma, consistent with a colorectal primary.  See  comment      COMMENT:   Immunohistochemical stains show the tumor cells are positive for CK20  and CDX2 but negative for CK7, consistent with above interpretation.  Dr. Mosetta Putt was notified on 01/10/2020   01/08/2020 Genetic Testing   [name]  [name] wildtype and [name]/[name] mutations which make her eligible for target biological agent Vectibix.   01/24/2020 Procedure   PAC placed 01/24/20   01/25/2020 -  Chemotherapy   first line FOLFOX starting 01/25/2020, Vextibix  added with C2 (02/06/20)   02/06/2020 - 02/06/2022 Chemotherapy   Patient is on Treatment Plan : COLORECTAL Panitumumab q14d ([name] Wild - Type Gene Only)     06/20/2020 Imaging   CT CAP  IMPRESSION: 1. Continued interval reduction in size and conspicuity of a subsolid nodule of the peripheral left upper lobe. 2. Unchanged prominent pretracheal and subcarinal lymph nodes. 3. Redemonstrated partially calcified low-attenuation liver masses, slightly decreased in size compared to prior examination. 4. Slight interval decrease in size of a portacaval lymph node or conglomerate and retroperitoneal lymph nodes. 5. Findings are consistent  with continued treatment response of nodal, pulmonary, and hepatic metastatic disease. 6. Coronary artery disease.   Aortic Atherosclerosis (ICD10-I70.0).     10/09/2020 Imaging   IMPRESSION: 1. Slight decrease in size of dominant liver mass with smaller liver mass within 1-2 mm of prior measurement. 2. Stable appearance of celiac lymph and retroperitoneal lymph nodes. Dominant node with calcification in the gastrohepatic ligament as described. 3. Continued decrease in size of LEFT upper lobe nodule. 4. Three-vessel coronary artery calcification. 5. Aortic atherosclerosis.   03/25/2022 -  Chemotherapy   Patient is on Treatment Plan : COLORECTAL FOLFIRI + Bevacizumab q14d        INTERVAL HISTORY:  Lindsay [name] is here for a follow up of metastatic colon cancer. She was last seen by me on 09/09/2022. She presents to the clinic alone. Pt state that she has no problem with treatment. Her appetite is good.  Pt has no other issues with treatment. Pt state that she has been having hot flashes.   All other systems were reviewed with the patient and are negative.  MEDICAL HISTORY:  Past Medical History:  Diagnosis Date   Arthritis    Diabetes mellitus without complication (HCC)    Hypertension    met colon ca to liver 01/2020    SURGICAL HISTORY: Past Surgical History:  Procedure Laterality Date   ABDOMINAL HYSTERECTOMY     ANTERIOR AND POSTERIOR REPAIR WITH SACROSPINOUS FIXATION N/A 07/16/2016   Procedure: ANTERIOR AND POSTERIOR REPAIR WITH SACROSPINOUS FIXATION;  Surgeon: Harold Hedge, MD;  Location: [facility];  Service: Gynecology;  Laterality: N/A;   CYSTOSCOPY  07/16/2016   Procedure: CYSTOSCOPY;  Surgeon: Harold Hedge, MD;  Location: [facility];  Service: Gynecology;;   ESOPHAGOGASTRODUODENOSCOPY (EGD) WITH PROPOFOL N/A 12/22/2019   Procedure: ESOPHAGOGASTRODUODENOSCOPY (EGD) WITH PROPOFOL;  Surgeon: Jeani Hawking, MD;  Location: [facility];  Service: Endoscopy;  Laterality:  N/A;   FINE NEEDLE ASPIRATION N/A 12/22/2019   Procedure: FINE NEEDLE ASPIRATION (FNA) LINEAR;  Surgeon: Jeani Hawking, MD;  Location: [facility];  Service: Endoscopy;  Laterality: N/A;   IR IMAGING GUIDED PORT INSERTION  01/24/2020   LAPAROSCOPIC VAGINAL HYSTERECTOMY WITH SALPINGECTOMY Bilateral 07/16/2016   Procedure: LAPAROSCOPIC ASSISTED VAGINAL HYSTERECTOMY WITH SALPINGECTOMY;  Surgeon: Harold Hedge, MD;  Location: [facility];  Service: Gynecology;  Laterality: Bilateral;   MYOMECTOMY ABDOMINAL APPROACH     THYROID SURGERY     tyroid     UPPER ESOPHAGEAL ENDOSCOPIC ULTRASOUND (EUS) N/A 12/22/2019   Procedure: UPPER ESOPHAGEAL ENDOSCOPIC ULTRASOUND (EUS);  Surgeon: Jeani Hawking, MD;  Location: [facility];  Service: Endoscopy;  Laterality: N/A;    I have reviewed the social history and family history with the patient and they are unchanged from previous note.  ALLERGIES:  is allergic to oxaliplatin.  MEDICATIONS:  Current Outpatient Medications  Medication Sig Dispense Refill   aspirin EC 81 MG tablet Take 1 tablet (81 mg total) by mouth daily. 90 tablet 3   atorvastatin (LIPITOR) 20 MG tablet Take 20 mg by mouth at bedtime.   1   clindamycin (CLINDAGEL) 1 % gel Apply topically 2 (two) times daily. 60 g 2   clobetasol cream (TEMOVATE) 0.05 % Apply 1 application topically daily.     hydrochlorothiazide (MICROZIDE) 12.5 MG capsule Take 12.5 mg by mouth daily.     KLOR-CON M20 20 MEQ tablet TAKE 1 TABLET TWICE A DAY 180 tablet 3   lidocaine-prilocaine (EMLA) cream Apply 1 Application topically as needed. 30 g 1   magic mouthwash w/lidocaine SOLN Take 5 mLs by mouth 4 (four) times daily. 475 mL 2   MAGNESIUM-OXIDE 400 (240 Mg) MG tablet TAKE 2 TABLETS BY MOUTH 4 TIMES DAILY 240 tablet 0   omeprazole (PRILOSEC) 20 MG capsule Take 1 capsule (20 mg total) by mouth daily. (Patient not taking: Reported on 06/03/2022) 30 capsule 2   ondansetron (ZOFRAN) 8 MG tablet Take 1 tablet (8 mg total)  by mouth every 8 (eight) hours as needed. 20 tablet 0   prochlorperazine (COMPAZINE) 10 MG tablet Take 1 tablet (10 mg total) by mouth every 6 (six) hours as needed for nausea or vomiting. 30 tablet 2   spironolactone (ALDACTONE) 25 MG tablet Take 25 mg by mouth daily with breakfast.     urea (CARMOL) 10 % cream Apply topically 2 (two) times daily. 71 g 2   valACYclovir (VALTREX) 1000 MG tablet Take 1 tablet (1,000 mg total) by mouth 2 (two) times daily. 10 tablet 0   verapamil (CALAN) 120 MG tablet Take 120 mg by mouth 3 (three) times daily.     No current facility-administered medications for this visit.   Facility-Administered Medications Ordered in Other Visits  Medication Dose Route Frequency Provider Last Rate Last Admin   fluorouracil (ADRUCIL) 5,000 mg in sodium chloride 0.9 % 150 mL chemo infusion  2,400 mg/m2 (Treatment Plan Recorded) Intravenous 1 day or 1 dose Malachy Mood, MD       irinotecan (CAMPTOSAR) 360 mg in sodium chloride 0.9 % 500 mL chemo infusion  180 mg/m2 (Treatment Plan Recorded) Intravenous Once Malachy Mood, MD 345 mL/hr at 09/23/22 1134 360 mg at 09/23/22 1134   leucovorin 780 mg  in sodium chloride 0.9 % 250 mL infusion  400 mg/m2 (Treatment Plan Recorded) Intravenous Once Malachy Mood, MD 193 mL/hr at 09/23/22 1135 780 mg at 09/23/22 1135   sodium chloride flush (NS) 0.9 % injection 10 mL  10 mL Intracatheter PRN Malachy Mood, MD        PHYSICAL EXAMINATION: ECOG PERFORMANCE STATUS: 1 - Symptomatic but completely ambulatory  Vitals:   09/23/22 0915  BP: (!) 153/90  Pulse: 64  Resp: 18  Temp: 98.4 F (36.9 C)  SpO2: 99%   Wt Readings from Last 3 Encounters:  09/23/22 182 lb 8 oz (82.8 kg)  09/09/22 181 lb 6.4 oz (82.3 kg)  08/26/22 181 lb 4.8 oz (82.2 kg)     GENERAL:alert, no distress and comfortable SKIN: skin color normal, no rashes or significant lesions EYES: normal, Conjunctiva are pink and non-injected, sclera clear  NEURO: alert & oriented x 3 with  fluent speech LABORATORY DATA:  I have reviewed the data as listed    Latest Ref Rng & Units 09/23/2022    8:47 AM 09/09/2022    9:08 AM 08/26/2022   10:17 AM  CBC  WBC 4.0 - 10.5 K/uL 9.1  8.7  8.6   Hemoglobin 12.0 - 15.0 g/dL 38.7  56.4  33.2   Hematocrit 36.0 - 46.0 % 35.3  37.5  36.6   Platelets 150 - 400 K/uL 288  273  225         Latest Ref Rng & Units 09/23/2022    8:47 AM 09/09/2022    9:08 AM 08/26/2022   10:17 AM  CMP  Glucose 70 - 99 mg/dL 951  884  166   BUN 8 - 23 mg/dL 10  9  12    Creatinine 0.44 - 1.00 mg/dL 0.63  0.16  0.10   Sodium 135 - 145 mmol/L 140  138  138   Potassium 3.5 - 5.1 mmol/L 3.8  3.9  4.0   Chloride 98 - 111 mmol/L 106  103  105   CO2 22 - 32 mmol/L 27  28  27    Calcium 8.9 - 10.3 mg/dL 9.3  9.9  9.7   Total Protein 6.5 - 8.1 g/dL 6.9  6.9  7.1   Total Bilirubin 0.3 - 1.2 mg/dL 0.2  0.3  0.3   Alkaline Phos 38 - 126 U/L 99  108  105   AST 15 - 41 U/L 11  13  14    ALT 0 - 44 U/L 12  16  16        RADIOGRAPHIC STUDIES: I have personally reviewed the radiological images as listed and agreed with the findings in the report. No results found.    No orders of the defined types were placed in this encounter.  All questions were answered. The patient knows to call the clinic with any problems, questions or concerns. No barriers to learning was detected. The total time spent in the appointment was 25 minutes.     Malachy Mood, MD 09/23/2022   Carolin Coy, CMA, am acting as scribe for Malachy Mood, MD.   I have reviewed the above documentation for accuracy and completeness, and I agree with the above.

## 2022-09-22 NOTE — Assessment & Plan Note (Signed)
MSS, [name]/[name] wildtype -diagnosed 12/2019 by porta hepatis LN biopsy during EUS for work up of abdominal pain and liver lesions seen on [place] and MRI. Liver biopsy 01/08/20 confirmed metastasis from primary colorectal cancer. PET scan showed hypermetabolism to known liver mets, diffuse thoracic and abdominal lymphadenopathy, a 1.8 cm LUL pulmonary nodule, and splenic flexure of colon. -treated with first line FOLFOX 01/25/20 - 10/02/20, Vectibix added with C2. Oxali discontinued after C18 due to reaction. -switched to Xeloda 10/21/20, dose adjusted due to significant skin toxicity -due to cancer progression on recent staging scan, treatment has been changed to FOLFIRI and bevacizumab on 03/25/22, she is overall tolerating well -will repeat CT scan after next cycle chemo  -Restaging CT scan from 06/16/2022 showed stable to mild decrease in size of treated liver metastasis. No new lesions -She is tolerating chemotherapy well overall, will continue -Her restaging CT scan on August 31, 2022 showed stable liver metastasis, and a stable 7 mm left lung nodule, indeterminate.  I personally reviewed the scan images with patient -Will continue current therapy.  We discussed the option of maintenance therapy with irinotecan and bevacizumab in future.  She is tolerating current FOLFIRI and bevacizumab well, will continue.

## 2022-09-23 ENCOUNTER — Inpatient Hospital Stay: Payer: Medicare (Managed Care)

## 2022-09-23 ENCOUNTER — Inpatient Hospital Stay (HOSPITAL_BASED_OUTPATIENT_CLINIC_OR_DEPARTMENT_OTHER): Payer: Medicare (Managed Care) | Admitting: Hematology

## 2022-09-23 ENCOUNTER — Other Ambulatory Visit: Payer: Self-pay

## 2022-09-23 VITALS — BP 153/90 | HR 64 | Temp 98.4°F | Resp 18 | Ht 66.0 in | Wt 182.5 lb

## 2022-09-23 DIAGNOSIS — C221 Intrahepatic bile duct carcinoma: Secondary | ICD-10-CM

## 2022-09-23 DIAGNOSIS — C787 Secondary malignant neoplasm of liver and intrahepatic bile duct: Secondary | ICD-10-CM | POA: Diagnosis not present

## 2022-09-23 DIAGNOSIS — Z95828 Presence of other vascular implants and grafts: Secondary | ICD-10-CM

## 2022-09-23 DIAGNOSIS — G620 Drug-induced polyneuropathy: Secondary | ICD-10-CM | POA: Diagnosis not present

## 2022-09-23 DIAGNOSIS — C19 Malignant neoplasm of rectosigmoid junction: Secondary | ICD-10-CM | POA: Diagnosis not present

## 2022-09-23 DIAGNOSIS — T451X5D Adverse effect of antineoplastic and immunosuppressive drugs, subsequent encounter: Secondary | ICD-10-CM | POA: Diagnosis not present

## 2022-09-23 DIAGNOSIS — Z5112 Encounter for antineoplastic immunotherapy: Secondary | ICD-10-CM | POA: Diagnosis not present

## 2022-09-23 DIAGNOSIS — C189 Malignant neoplasm of colon, unspecified: Secondary | ICD-10-CM

## 2022-09-23 LAB — CBC WITH DIFFERENTIAL (CANCER CENTER ONLY)
Abs Immature Granulocytes: 0.13 10*3/uL — ABNORMAL HIGH (ref 0.00–0.07)
Basophils Absolute: 0.1 10*3/uL (ref 0.0–0.1)
Basophils Relative: 1 %
Eosinophils Absolute: 0.2 10*3/uL (ref 0.0–0.5)
Eosinophils Relative: 2 %
HCT: 35.3 % — ABNORMAL LOW (ref 36.0–46.0)
Hemoglobin: 11.5 g/dL — ABNORMAL LOW (ref 12.0–15.0)
Immature Granulocytes: 1 %
Lymphocytes Relative: 17 %
Lymphs Abs: 1.6 10*3/uL (ref 0.7–4.0)
MCH: 30.1 pg (ref 26.0–34.0)
MCHC: 32.6 g/dL (ref 30.0–36.0)
MCV: 92.4 fL (ref 80.0–100.0)
Monocytes Absolute: 0.7 10*3/uL (ref 0.1–1.0)
Monocytes Relative: 8 %
Neutro Abs: 6.5 10*3/uL (ref 1.7–7.7)
Neutrophils Relative %: 71 %
Platelet Count: 288 10*3/uL (ref 150–400)
RBC: 3.82 MIL/uL — ABNORMAL LOW (ref 3.87–5.11)
RDW: 17.2 % — ABNORMAL HIGH (ref 11.5–15.5)
WBC Count: 9.1 10*3/uL (ref 4.0–10.5)
nRBC: 0 % (ref 0.0–0.2)

## 2022-09-23 LAB — CMP (CANCER CENTER ONLY)
ALT: 12 U/L (ref 0–44)
AST: 11 U/L — ABNORMAL LOW (ref 15–41)
Albumin: 3.9 g/dL (ref 3.5–5.0)
Alkaline Phosphatase: 99 U/L (ref 38–126)
Anion gap: 7 (ref 5–15)
BUN: 10 mg/dL (ref 8–23)
CO2: 27 mmol/L (ref 22–32)
Calcium: 9.3 mg/dL (ref 8.9–10.3)
Chloride: 106 mmol/L (ref 98–111)
Creatinine: 0.71 mg/dL (ref 0.44–1.00)
GFR, Estimated: >60 mL/min (ref >60)
Glucose, Bld: 130 mg/dL — ABNORMAL HIGH (ref 70–99)
Potassium: 3.8 mmol/L (ref 3.5–5.1)
Sodium: 140 mmol/L (ref 135–145)
Total Bilirubin: 0.2 mg/dL — ABNORMAL LOW (ref 0.3–1.2)
Total Protein: 6.9 g/dL (ref 6.5–8.1)

## 2022-09-23 LAB — MAGNESIUM: Magnesium: 1.7 mg/dL (ref 1.7–2.4)

## 2022-09-23 LAB — CEA (ACCESS): CEA (CHCC): 3.43 ng/mL (ref 0.00–5.00)

## 2022-09-23 LAB — TOTAL PROTEIN, URINE DIPSTICK: Protein, ur: NEGATIVE mg/dL

## 2022-09-23 MED ORDER — SODIUM CHLORIDE 0.9 % IV SOLN
5.0000 mg/kg | Freq: Once | INTRAVENOUS | Status: AC
  Administered 2022-09-23: 400 mg via INTRAVENOUS
  Filled 2022-09-23: qty 16

## 2022-09-23 MED ORDER — SODIUM CHLORIDE 0.9 % IV SOLN
400.0000 mg/m2 | Freq: Once | INTRAVENOUS | Status: AC
  Administered 2022-09-23: 780 mg via INTRAVENOUS
  Filled 2022-09-23: qty 17.5

## 2022-09-23 MED ORDER — PALONOSETRON HCL INJECTION 0.25 MG/5ML
0.2500 mg | Freq: Once | INTRAVENOUS | Status: AC
  Administered 2022-09-23: 0.25 mg via INTRAVENOUS
  Filled 2022-09-23: qty 5

## 2022-09-23 MED ORDER — SODIUM CHLORIDE 0.9 % IV SOLN: Freq: Once | INTRAVENOUS | Status: AC

## 2022-09-23 MED ORDER — SODIUM CHLORIDE 0.9 % IV SOLN
10.0000 mg | Freq: Once | INTRAVENOUS | Status: AC
  Administered 2022-09-23: 10 mg via INTRAVENOUS
  Filled 2022-09-23: qty 10

## 2022-09-23 MED ORDER — SODIUM CHLORIDE 0.9% FLUSH
10.0000 mL | Freq: Once | INTRAVENOUS | Status: AC
  Administered 2022-09-23: 10 mL

## 2022-09-23 MED ORDER — SODIUM CHLORIDE 0.9 % IV SOLN
180.0000 mg/m2 | Freq: Once | INTRAVENOUS | Status: AC
  Administered 2022-09-23: 360 mg via INTRAVENOUS
  Filled 2022-09-23: qty 3

## 2022-09-23 MED ORDER — SODIUM CHLORIDE 0.9 % IV SOLN
2400.0000 mg/m2 | INTRAVENOUS | Status: DC
  Administered 2022-09-23: 5000 mg via INTRAVENOUS
  Filled 2022-09-23: qty 100

## 2022-09-23 MED ORDER — ATROPINE SULFATE 1 MG/ML IV SOLN
0.5000 mg | Freq: Once | INTRAVENOUS | Status: AC | PRN
  Administered 2022-09-23: 0.5 mg via INTRAVENOUS
  Filled 2022-09-23: qty 1

## 2022-09-23 MED ORDER — SODIUM CHLORIDE 0.9% FLUSH
10.0000 mL | INTRAVENOUS | Status: DC | PRN
  Administered 2022-09-23: 10 mL

## 2022-09-23 NOTE — Patient Instructions (Signed)
[facility]  Discharge Instructions: Thank you for choosing [facility] to provide your oncology and hematology care.   If you have a lab appointment with the Cancer Center, please go directly to the Cancer Center and check in at the registration area.   Wear comfortable clothing and clothing appropriate for easy access to any Portacath or PICC line.   We strive to give you quality time with your provider. You may need to reschedule your appointment if you arrive late (15 or more minutes).  Arriving late affects you and other patients whose appointments are after yours.  Also, if you miss three or more appointments without notifying the office, you may be dismissed from the clinic at the provider's discretion.      For prescription refill requests, have your pharmacy contact our office and allow 72 hours for refills to be completed.    Today you received the following chemotherapy and/or immunotherapy agents: MVASI/Irinotecan/Leucovorin/Fluorouracil      To help prevent nausea and vomiting after your treatment, we encourage you to take your nausea medication as directed.  BELOW ARE SYMPTOMS THAT SHOULD BE REPORTED IMMEDIATELY: *FEVER GREATER THAN 100.4 F (38 C) OR HIGHER *CHILLS OR SWEATING *NAUSEA AND VOMITING THAT IS NOT CONTROLLED WITH YOUR NAUSEA MEDICATION *UNUSUAL SHORTNESS OF BREATH *UNUSUAL BRUISING OR BLEEDING *URINARY PROBLEMS (pain or burning when urinating, or frequent urination) *BOWEL PROBLEMS (unusual diarrhea, constipation, pain near the anus) TENDERNESS IN MOUTH AND THROAT WITH OR WITHOUT PRESENCE OF ULCERS (sore throat, sores in mouth, or a toothache) UNUSUAL RASH, SWELLING OR PAIN  UNUSUAL VAGINAL DISCHARGE OR ITCHING   Items with * indicate a potential emergency and should be followed up as soon as possible or go to the Emergency Department if any problems should occur.  Please show the CHEMOTHERAPY ALERT CARD or  IMMUNOTHERAPY ALERT CARD at check-in to the Emergency Department and triage nurse.  Should you have questions after your visit or need to cancel or reschedule your appointment, please contact [facility]  Dept: [phone]  and follow the prompts.  Office hours are 8:00 a.m. to 4:30 p.m. Monday - Friday. Please note that voicemails left after 4:00 p.m. may not be returned until the following business day.  We are closed weekends and major holidays. You have access to a nurse at all times for urgent questions. Please call the main number to the clinic Dept: [phone] and follow the prompts.   For any non-urgent questions, you may also contact your provider using MyChart. We now offer e-Visits for anyone 17 and older to request care online for non-urgent symptoms. For details visit [email or web address].   Also download the MyChart app! Go to the app store, search "MyChart", open the app, select [facility], and log in with your MyChart username and password.

## 2022-09-25 ENCOUNTER — Inpatient Hospital Stay: Payer: Medicare (Managed Care)

## 2022-09-25 ENCOUNTER — Other Ambulatory Visit: Payer: Self-pay

## 2022-09-25 VITALS — BP 126/56 | HR 56 | Temp 98.0°F | Resp 187

## 2022-09-25 DIAGNOSIS — C221 Intrahepatic bile duct carcinoma: Secondary | ICD-10-CM

## 2022-09-25 DIAGNOSIS — Z5112 Encounter for antineoplastic immunotherapy: Secondary | ICD-10-CM | POA: Diagnosis not present

## 2022-09-25 MED ORDER — SODIUM CHLORIDE 0.9% FLUSH
10.0000 mL | INTRAVENOUS | Status: DC | PRN
  Administered 2022-09-25: 10 mL

## 2022-09-25 MED ORDER — HEPARIN SOD (PORK) LOCK FLUSH 100 UNIT/ML IV SOLN
500.0000 [IU] | Freq: Once | INTRAVENOUS | Status: AC | PRN
  Administered 2022-09-25: 500 [IU]

## 2022-09-25 MED ORDER — PEGFILGRASTIM-CBQV 6 MG/0.6ML ~~LOC~~ SOSY
6.0000 mg | PREFILLED_SYRINGE | Freq: Once | SUBCUTANEOUS | Status: AC
  Administered 2022-09-25: 6 mg via SUBCUTANEOUS

## 2022-09-25 NOTE — Patient Instructions (Signed)

## 2022-10-06 MED FILL — Dexamethasone Sodium Phosphate Inj 100 MG/10ML: INTRAMUSCULAR | Qty: 1 | Status: AC

## 2022-10-06 NOTE — Assessment & Plan Note (Signed)
MSS, [name]/[name] wildtype -diagnosed 12/2019 by porta hepatis LN biopsy during EUS for work up of abdominal pain and liver lesions seen on [place] and MRI. Liver biopsy 01/08/20 confirmed metastasis from primary colorectal cancer. PET scan showed hypermetabolism to known liver mets, diffuse thoracic and abdominal lymphadenopathy, a 1.8 cm LUL pulmonary nodule, and splenic flexure of colon. -treated with first line FOLFOX 01/25/20 - 10/02/20, Vectibix added with C2. Oxali discontinued after C18 due to reaction. -switched to Xeloda 10/21/20, dose adjusted due to significant skin toxicity -due to cancer progression on recent staging scan, treatment has been changed to FOLFIRI and bevacizumab on 03/25/22, she is overall tolerating well -will repeat CT scan after next cycle chemo  -Restaging CT scan from 06/16/2022 showed stable to mild decrease in size of treated liver metastasis. No new lesions -She is tolerating chemotherapy well overall, will continue -Her restaging CT scan on August 31, 2022 showed stable liver metastasis, and a stable 7 mm left lung nodule, indeterminate.  I personally reviewed the scan images with patient -Will continue current therapy.  We discussed the option of maintenance therapy with irinotecan and bevacizumab in future.  She is tolerating current FOLFIRI and bevacizumab well, will continue.

## 2022-10-06 NOTE — Progress Notes (Unsigned)
[facility]   [phone] Fax:([phone]   Clinic Follow up Note   Patient Care Team: Ralene Ok, MD as PCP - General (Internal Medicine) Radonna Ricker, RN (Inactive) as Oncology Nurse Navigator Malachy Mood, MD as Consulting Physician (Oncology) Jeani Hawking, MD as Consulting Physician (Gastroenterology)  Date of Service:  10/07/2022  CHIEF COMPLAINT: f/u of metastatic colon cancer   CURRENT THERAPY:  FOLFIRI/Beva q14 days, started 03/25/22     ASSESSMENT:  Lindsay [name] is a 67 y.o. 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 Liver biopsy 01/08/20 confirmed metastasis from primary colorectal cancer. PET scan showed hypermetabolism to known liver mets, diffuse thoracic and abdominal lymphadenopathy, a 1.8 cm LUL pulmonary nodule, and splenic flexure of colon. -treated with first line FOLFOX 01/25/20 - 10/02/20, Vectibix added with C2. Oxali discontinued after C18 due to reaction. -switched to Xeloda 10/21/20, dose adjusted due to significant skin toxicity -due to cancer progression on recent staging scan, treatment has been changed to FOLFIRI and bevacizumab on 03/25/22, she is overall tolerating well -will repeat CT scan after next cycle chemo  -Restaging CT scan from 06/16/2022 showed stable to mild decrease in size of treated liver metastasis. No new lesions -She is tolerating chemotherapy well overall, will continue -Her restaging CT scan on August 31, 2022 showed stable liver metastasis, and a stable 7 mm left lung nodule, indeterminate.  I personally reviewed the scan images with patient -Will continue current therapy.  We discussed the option of maintenance therapy with irinotecan and bevacizumab in future.  She is  tolerating current FOLFIRI and bevacizumab well, will continue.       PLAN: -lab reviewed -CMP-pending -post pone  8/28 treatment and  8/30  and Sept 11 due to vacation -CT scan in Sept. - pt continue to take potassium  -proceed with FOLFIRI today -lab/flush and treatment 8/16 SUMMARY OF ONCOLOGIC HISTORY: Oncology History  metastatic colon cancer to liver  06/20/2019 Procedure   Colonoscopy by Dr Meridee Score 06/20/19  IMPRESSION -Seven 3 to 10 mm polyps in the sigmoid colon, in the transverse colon and in the escending colon, removed with a cold snare. Resected and retrireved.  -One 20mm polyp in the descending colon. Biopsies. Tattoes.  -Mediaum sized lipoma in the ascending colon.   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 See Comment B. Colon, Ascending, polyp, Polypectomy:  -TUBULAR ADENOMA -No high grade dysplasia or malignancy.  C. Colon, TRansverse, Polyo, polectomy:  -TUBULAR ADENOMA -No high grade dysplasia or malignancy.  D. Colon, Sigmoid, Polyp, Polypectomy:  -HYPERPLASTIC POLYP   11/07/2019 Imaging   US Abdomen 11/07/19  IMPRESSION: 1. Two solid masses in the liver are nonspecific. Recommend MRI abdomen with and without contrast for further evaluation.   12/15/2019 Imaging   MRI Abdomen 12/15/19  IMPRESSION: 1. There are two large masses in the liver with appearance favoring metastatic disease or hepatocellular carcinoma or cholangiocarcinoma. A benign etiology is highly unlikely given the enhancement pattern and associated adenopathy. 2. Considerable porta hepatis and retroperitoneal adenopathy. Some of the confluent porta hepatis tumor is potentially infiltrative and abuts the pancreatic body along its right upper margin, making it difficult to completely exclude the possibility of  pancreatic adenocarcinoma primary. Possibilities helpful in further workup might include  tissue diagnosis, endoscopic ultrasound, or nuclear medicine PET-CT. 3. Pancreas divisum. 4. Lumbar spondylosis and degenerative disc disease. 5. Despite efforts by the technologist and patient, motion artifact is present on today's exam and could not be eliminated. This reduces exam sensitivity and specificity.   12/22/2019 Procedure   Upper Endoscopy by Dr Elnoria Howard 12/22/19  IMPRESSION - One lymph node was visualized and measured in the porta hepatis region. Fine needle aspiration performed    12/22/2019 Initial Biopsy   A. LIVER, PORTA HEPATIS MASS, FINE NEEDLE 12/22/19 ASPIRATION:  FINAL MICROSCOPIC DIAGNOSIS:  - Malignant cells consistent with metastatic adenocarcinoma   01/01/2020 Initial Diagnosis   Intrahepatic cholangiocarcinoma (HCC)   01/08/2020 Initial Biopsy   FINAL MICROSCOPIC DIAGNOSIS:   A. LIVER, LEFT LOBE, BIOPSY:  - Metastatic adenocarcinoma, consistent with a colorectal primary.  See  comment      COMMENT:   Immunohistochemical stains show the tumor cells are positive for CK20  and CDX2 but negative for CK7, consistent with above interpretation.  Dr. Mosetta Putt was notified on 01/10/2020   01/08/2020 Genetic Testing   [name]  [name] wildtype and [name]/[name] mutations which make her eligible for target biological agent Vectibix.   01/24/2020 Procedure   PAC placed 01/24/20   01/25/2020 -  Chemotherapy   first line FOLFOX starting 01/25/2020, Vextibix  added with C2 (02/06/20)   02/06/2020 - 02/06/2022 Chemotherapy   Patient is on Treatment Plan : COLORECTAL Panitumumab q14d ([name] Wild - Type Gene Only)     06/20/2020 Imaging   CT CAP  IMPRESSION: 1. Continued interval reduction in size and conspicuity of a subsolid nodule of the peripheral left upper lobe. 2. Unchanged prominent pretracheal and subcarinal lymph nodes. 3. Redemonstrated partially calcified low-attenuation liver masses, slightly decreased in size compared to prior examination. 4.  Slight interval decrease in size of a portacaval lymph node or conglomerate and retroperitoneal lymph nodes. 5. Findings are consistent with continued treatment response of nodal, pulmonary, and hepatic metastatic disease. 6. Coronary artery disease.   Aortic Atherosclerosis (ICD10-I70.0).     10/09/2020 Imaging   IMPRESSION: 1. Slight decrease in size of dominant liver mass with smaller liver mass within 1-2 mm of prior measurement. 2. Stable appearance of celiac lymph and retroperitoneal lymph nodes. Dominant node with calcification in the gastrohepatic ligament as described. 3. Continued decrease in size of LEFT upper lobe nodule. 4. Three-vessel coronary artery calcification. 5. Aortic atherosclerosis.   03/25/2022 -  Chemotherapy   Patient is on Treatment Plan : COLORECTAL FOLFIRI + Bevacizumab q14d     08/31/2022 Imaging    IMPRESSION: 1. Unchanged, densely calcified liver lesions, consistent with treated metastatic disease. 2. Unchanged, irregular subpleural opacity of the peripheral left upper lobe. 3. Unchanged subcentimeter gastrohepatic ligament lymph node. 4. No evidence of new metastatic disease in the chest, abdomen, or pelvis. 5. Status post hysterectomy. 6. Coronary artery disease. 7. Aortic valve calcifications. Correlate for echocardiographic evidence of aortic valve dysfunction.        INTERVAL HISTORY:   Lindsay [name] is here for a follow up of metastatic colon cancer. She was last seen by  me on 09/23/2022 She presents to the clinic alone. Pt state that she is doing well she has no issues with treatment. Pt report she has a rash on her hand and still some skin darkness.      All other systems were reviewed with the patient and are negative.  MEDICAL HISTORY:  Past Medical History:  Diagnosis Date   Arthritis    Diabetes mellitus without complication (HCC)    Hypertension    met colon ca to liver 01/2020    SURGICAL HISTORY: Past Surgical  History:  Procedure Laterality Date   ABDOMINAL HYSTERECTOMY     ANTERIOR AND POSTERIOR REPAIR WITH SACROSPINOUS FIXATION N/A 07/16/2016   Procedure: ANTERIOR AND POSTERIOR REPAIR WITH SACROSPINOUS FIXATION;  Surgeon: Harold Hedge, MD;  Location: [facility];  Service: Gynecology;  Laterality: N/A;   CYSTOSCOPY  07/16/2016   Procedure: CYSTOSCOPY;  Surgeon: Harold Hedge, MD;  Location: [facility];  Service: Gynecology;;   ESOPHAGOGASTRODUODENOSCOPY (EGD) WITH PROPOFOL N/A 12/22/2019   Procedure: ESOPHAGOGASTRODUODENOSCOPY (EGD) WITH PROPOFOL;  Surgeon: Jeani Hawking, MD;  Location: [facility];  Service: Endoscopy;  Laterality: N/A;   FINE NEEDLE ASPIRATION N/A 12/22/2019   Procedure: FINE NEEDLE ASPIRATION (FNA) LINEAR;  Surgeon: Jeani Hawking, MD;  Location: [facility];  Service: Endoscopy;  Laterality: N/A;   IR IMAGING GUIDED PORT INSERTION  01/24/2020   LAPAROSCOPIC VAGINAL HYSTERECTOMY WITH SALPINGECTOMY Bilateral 07/16/2016   Procedure: LAPAROSCOPIC ASSISTED VAGINAL HYSTERECTOMY WITH SALPINGECTOMY;  Surgeon: Harold Hedge, MD;  Location: [facility];  Service: Gynecology;  Laterality: Bilateral;   MYOMECTOMY ABDOMINAL APPROACH     THYROID SURGERY     tyroid     UPPER ESOPHAGEAL ENDOSCOPIC ULTRASOUND (EUS) N/A 12/22/2019   Procedure: UPPER ESOPHAGEAL ENDOSCOPIC ULTRASOUND (EUS);  Surgeon: Jeani Hawking, MD;  Location: [facility];  Service: Endoscopy;  Laterality: N/A;    I have reviewed the social history and family history with the patient and they are unchanged from previous note.  ALLERGIES:  is allergic to oxaliplatin.  MEDICATIONS:  Current Outpatient Medications  Medication Sig Dispense Refill   aspirin EC 81 MG tablet Take 1 tablet (81 mg total) by mouth daily. 90 tablet 3   atorvastatin (LIPITOR) 20 MG tablet Take 20 mg by mouth at bedtime.   1   clindamycin (CLINDAGEL) 1 % gel Apply topically 2 (two) times daily. 60 g 2   clobetasol cream (TEMOVATE) 0.05 % Apply 1 application  topically daily.     hydrochlorothiazide (MICROZIDE) 12.5 MG capsule Take 12.5 mg by mouth daily.     KLOR-CON M20 20 MEQ tablet TAKE 1 TABLET TWICE A DAY 180 tablet 3   lidocaine-prilocaine (EMLA) cream Apply 1 Application topically as needed. 30 g 1   magic mouthwash w/lidocaine SOLN Take 5 mLs by mouth 4 (four) times daily. 475 mL 2   MAGNESIUM-OXIDE 400 (240 Mg) MG tablet TAKE 2 TABLETS BY MOUTH 4 TIMES DAILY 240 tablet 0   omeprazole (PRILOSEC) 20 MG capsule Take 1 capsule (20 mg total) by mouth daily. (Patient not taking: Reported on 06/03/2022) 30 capsule 2   ondansetron (ZOFRAN) 8 MG tablet Take 1 tablet (8 mg total) by mouth every 8 (eight) hours as needed. 20 tablet 0   prochlorperazine (COMPAZINE) 10 MG tablet Take 1 tablet (10 mg total) by mouth every 6 (six) hours as needed for nausea or vomiting. 30 tablet 2   spironolactone (ALDACTONE) 25 MG tablet Take 25 mg by mouth daily with breakfast.     urea (CARMOL) 10 % cream Apply topically 2 (two) times daily. 71 g 2   valACYclovir (VALTREX) 1000 MG tablet Take 1 tablet (1,000 mg total) by mouth 2 (two) times daily. 10 tablet 0   verapamil (CALAN) 120 MG tablet Take 120 mg by mouth 3 (three) times daily.  No current facility-administered medications for this visit.   Facility-Administered Medications Ordered in Other Visits  Medication Dose Route Frequency Provider Last Rate Last Admin   fluorouracil (ADRUCIL) 5,000 mg in sodium chloride 0.9 % 150 mL chemo infusion  2,400 mg/m2 (Treatment Plan Recorded) Intravenous 1 day or 1 dose Malachy Mood, MD        PHYSICAL EXAMINATION: ECOG PERFORMANCE STATUS: 1 - Symptomatic but completely ambulatory  Vitals:   10/07/22 0932  BP: 132/82  Pulse: 66  Resp: 16  Temp: 98.5 F (36.9 C)  SpO2: 100%   Wt Readings from Last 3 Encounters:  10/07/22 182 lb (82.6 kg)  09/23/22 182 lb 8 oz (82.8 kg)  09/09/22 181 lb 6.4 oz (82.3 kg)     GENERAL:alert, no distress and  comfortable SKIN:(+)skin color normal, (+( rashes or significant lesions EYES: normal, Conjunctiva are pink and non-injected, sclera clear  NEURO: alert & oriented x 3 with fluent speech  LABORATORY DATA:  I have reviewed the data as listed    Latest Ref Rng & Units 10/07/2022    9:10 AM 09/23/2022    8:47 AM 09/09/2022    9:08 AM  CBC  WBC 4.0 - 10.5 K/uL 11.1  9.1  8.7   Hemoglobin 12.0 - 15.0 g/dL 16.1  09.6  04.5   Hematocrit 36.0 - 46.0 % 35.4  35.3  37.5   Platelets 150 - 400 K/uL 276  288  273         Latest Ref Rng & Units 10/07/2022    9:10 AM 09/23/2022    8:47 AM 09/09/2022    9:08 AM  CMP  Glucose 70 - 99 mg/dL 409  811  914   BUN 8 - 23 mg/dL 9  10  9    Creatinine 0.44 - 1.00 mg/dL 7.82  9.56  2.13   Sodium 135 - 145 mmol/L 139  140  138   Potassium 3.5 - 5.1 mmol/L 3.9  3.8  3.9   Chloride 98 - 111 mmol/L 106  106  103   CO2 22 - 32 mmol/L 28  27  28    Calcium 8.9 - 10.3 mg/dL 9.6  9.3  9.9   Total Protein 6.5 - 8.1 g/dL 7.1  6.9  6.9   Total Bilirubin 0.3 - 1.2 mg/dL 0.2  0.2  0.3   Alkaline Phos 38 - 126 U/L 113  99  108   AST 15 - 41 U/L 13  11  13    ALT 0 - 44 U/L 14  12  16        RADIOGRAPHIC STUDIES: I have personally reviewed the radiological images as listed and agreed with the findings in the report. No results found.    No orders of the defined types were placed in this encounter.  All questions were answered. The patient knows to call the clinic with any problems, questions or concerns. No barriers to learning was detected. The total time spent in the appointment was 25 minutes.     Malachy Mood, MD 10/07/2022   Carolin Coy, CMA, am acting as scribe for Malachy Mood, MD.   I have reviewed the above documentation for accuracy and completeness, and I agree with the above.

## 2022-10-06 NOTE — Assessment & Plan Note (Signed)
-  overall mild and stable

## 2022-10-07 ENCOUNTER — Inpatient Hospital Stay: Payer: Medicare (Managed Care)

## 2022-10-07 ENCOUNTER — Other Ambulatory Visit: Payer: Self-pay

## 2022-10-07 ENCOUNTER — Inpatient Hospital Stay (HOSPITAL_BASED_OUTPATIENT_CLINIC_OR_DEPARTMENT_OTHER): Payer: Medicare (Managed Care) | Admitting: Hematology

## 2022-10-07 VITALS — BP 132/82 | HR 66 | Temp 98.5°F | Resp 16 | Wt 182.0 lb

## 2022-10-07 DIAGNOSIS — G620 Drug-induced polyneuropathy: Secondary | ICD-10-CM | POA: Diagnosis not present

## 2022-10-07 DIAGNOSIS — Z5112 Encounter for antineoplastic immunotherapy: Secondary | ICD-10-CM | POA: Diagnosis not present

## 2022-10-07 DIAGNOSIS — C221 Intrahepatic bile duct carcinoma: Secondary | ICD-10-CM | POA: Diagnosis not present

## 2022-10-07 DIAGNOSIS — Z95828 Presence of other vascular implants and grafts: Secondary | ICD-10-CM

## 2022-10-07 DIAGNOSIS — T451X5A Adverse effect of antineoplastic and immunosuppressive drugs, initial encounter: Secondary | ICD-10-CM | POA: Diagnosis not present

## 2022-10-07 DIAGNOSIS — C189 Malignant neoplasm of colon, unspecified: Secondary | ICD-10-CM

## 2022-10-07 LAB — CBC WITH DIFFERENTIAL (CANCER CENTER ONLY)
Abs Immature Granulocytes: 0.08 10*3/uL — ABNORMAL HIGH (ref 0.00–0.07)
Basophils Absolute: 0.1 10*3/uL (ref 0.0–0.1)
Basophils Relative: 1 %
Eosinophils Absolute: 0.1 10*3/uL (ref 0.0–0.5)
Eosinophils Relative: 1 %
HCT: 35.4 % — ABNORMAL LOW (ref 36.0–46.0)
Hemoglobin: 11.5 g/dL — ABNORMAL LOW (ref 12.0–15.0)
Immature Granulocytes: 1 %
Lymphocytes Relative: 13 %
Lymphs Abs: 1.4 10*3/uL (ref 0.7–4.0)
MCH: 29.9 pg (ref 26.0–34.0)
MCHC: 32.5 g/dL (ref 30.0–36.0)
MCV: 91.9 fL (ref 80.0–100.0)
Monocytes Absolute: 1 10*3/uL (ref 0.1–1.0)
Monocytes Relative: 9 %
Neutro Abs: 8.4 10*3/uL — ABNORMAL HIGH (ref 1.7–7.7)
Neutrophils Relative %: 75 %
Platelet Count: 276 10*3/uL (ref 150–400)
RBC: 3.85 MIL/uL — ABNORMAL LOW (ref 3.87–5.11)
RDW: 17.2 % — ABNORMAL HIGH (ref 11.5–15.5)
WBC Count: 11.1 10*3/uL — ABNORMAL HIGH (ref 4.0–10.5)
nRBC: 0 % (ref 0.0–0.2)

## 2022-10-07 LAB — CMP (CANCER CENTER ONLY)
ALT: 14 U/L (ref 0–44)
AST: 13 U/L — ABNORMAL LOW (ref 15–41)
Albumin: 4 g/dL (ref 3.5–5.0)
Alkaline Phosphatase: 113 U/L (ref 38–126)
Anion gap: 5 (ref 5–15)
BUN: 9 mg/dL (ref 8–23)
CO2: 28 mmol/L (ref 22–32)
Calcium: 9.6 mg/dL (ref 8.9–10.3)
Chloride: 106 mmol/L (ref 98–111)
Creatinine: 0.61 mg/dL (ref 0.44–1.00)
GFR, Estimated: >60 mL/min (ref >60)
Glucose, Bld: 127 mg/dL — ABNORMAL HIGH (ref 70–99)
Potassium: 3.9 mmol/L (ref 3.5–5.1)
Sodium: 139 mmol/L (ref 135–145)
Total Bilirubin: 0.2 mg/dL — ABNORMAL LOW (ref 0.3–1.2)
Total Protein: 7.1 g/dL (ref 6.5–8.1)

## 2022-10-07 LAB — MAGNESIUM: Magnesium: 1.7 mg/dL (ref 1.7–2.4)

## 2022-10-07 LAB — TOTAL PROTEIN, URINE DIPSTICK: Protein, ur: NEGATIVE mg/dL

## 2022-10-07 LAB — CEA (ACCESS): CEA (CHCC): 4.32 ng/mL (ref 0.00–5.00)

## 2022-10-07 MED ORDER — SODIUM CHLORIDE 0.9% FLUSH
10.0000 mL | Freq: Once | INTRAVENOUS | Status: AC
  Administered 2022-10-07: 10 mL

## 2022-10-07 MED ORDER — SODIUM CHLORIDE 0.9 % IV SOLN
10.0000 mg | Freq: Once | INTRAVENOUS | Status: AC
  Administered 2022-10-07: 10 mg via INTRAVENOUS
  Filled 2022-10-07: qty 10

## 2022-10-07 MED ORDER — SODIUM CHLORIDE 0.9 % IV SOLN
400.0000 mg/m2 | Freq: Once | INTRAVENOUS | Status: AC
  Administered 2022-10-07: 780 mg via INTRAVENOUS
  Filled 2022-10-07: qty 25

## 2022-10-07 MED ORDER — SODIUM CHLORIDE 0.9 % IV SOLN
2400.0000 mg/m2 | INTRAVENOUS | Status: DC
  Administered 2022-10-07: 5000 mg via INTRAVENOUS
  Filled 2022-10-07: qty 100

## 2022-10-07 MED ORDER — SODIUM CHLORIDE 0.9 % IV SOLN: Freq: Once | INTRAVENOUS | Status: AC

## 2022-10-07 MED ORDER — SODIUM CHLORIDE 0.9 % IV SOLN
5.0000 mg/kg | Freq: Once | INTRAVENOUS | Status: AC
  Administered 2022-10-07: 400 mg via INTRAVENOUS
  Filled 2022-10-07: qty 16

## 2022-10-07 MED ORDER — PALONOSETRON HCL INJECTION 0.25 MG/5ML
0.2500 mg | Freq: Once | INTRAVENOUS | Status: AC
  Administered 2022-10-07: 0.25 mg via INTRAVENOUS
  Filled 2022-10-07: qty 5

## 2022-10-07 MED ORDER — ATROPINE SULFATE 1 MG/ML IV SOLN
0.5000 mg | Freq: Once | INTRAVENOUS | Status: AC | PRN
  Administered 2022-10-07: 0.5 mg via INTRAVENOUS
  Filled 2022-10-07: qty 1

## 2022-10-07 MED ORDER — SODIUM CHLORIDE 0.9 % IV SOLN
180.0000 mg/m2 | Freq: Once | INTRAVENOUS | Status: AC
  Administered 2022-10-07: 360 mg via INTRAVENOUS
  Filled 2022-10-07: qty 3

## 2022-10-07 NOTE — Patient Instructions (Signed)
[facility]  Discharge Instructions: Thank you for choosing [facility] to provide your oncology and hematology care.   If you have a lab appointment with the Cancer Center, please go directly to the Cancer Center and check in at the registration area.   Wear comfortable clothing and clothing appropriate for easy access to any Portacath or PICC line.   We strive to give you quality time with your provider. You may need to reschedule your appointment if you arrive late (15 or more minutes).  Arriving late affects you and other patients whose appointments are after yours.  Also, if you miss three or more appointments without notifying the office, you may be dismissed from the clinic at the provider's discretion.      For prescription refill requests, have your pharmacy contact our office and allow 72 hours for refills to be completed.    Today you received the following chemotherapy and/or immunotherapy agents: MVASI/Irinotecan/Leucovorin/Fluorouracil      To help prevent nausea and vomiting after your treatment, we encourage you to take your nausea medication as directed.  BELOW ARE SYMPTOMS THAT SHOULD BE REPORTED IMMEDIATELY: *FEVER GREATER THAN 100.4 F (38 C) OR HIGHER *CHILLS OR SWEATING *NAUSEA AND VOMITING THAT IS NOT CONTROLLED WITH YOUR NAUSEA MEDICATION *UNUSUAL SHORTNESS OF BREATH *UNUSUAL BRUISING OR BLEEDING *URINARY PROBLEMS (pain or burning when urinating, or frequent urination) *BOWEL PROBLEMS (unusual diarrhea, constipation, pain near the anus) TENDERNESS IN MOUTH AND THROAT WITH OR WITHOUT PRESENCE OF ULCERS (sore throat, sores in mouth, or a toothache) UNUSUAL RASH, SWELLING OR PAIN  UNUSUAL VAGINAL DISCHARGE OR ITCHING   Items with * indicate a potential emergency and should be followed up as soon as possible or go to the Emergency Department if any problems should occur.  Please show the CHEMOTHERAPY ALERT CARD or  IMMUNOTHERAPY ALERT CARD at check-in to the Emergency Department and triage nurse.  Should you have questions after your visit or need to cancel or reschedule your appointment, please contact [facility]  Dept: [phone]  and follow the prompts.  Office hours are 8:00 a.m. to 4:30 p.m. Monday - Friday. Please note that voicemails left after 4:00 p.m. may not be returned until the following business day.  We are closed weekends and major holidays. You have access to a nurse at all times for urgent questions. Please call the main number to the clinic Dept: [phone] and follow the prompts.   For any non-urgent questions, you may also contact your provider using MyChart. We now offer e-Visits for anyone 17 and older to request care online for non-urgent symptoms. For details visit [email or web address].   Also download the MyChart app! Go to the app store, search "MyChart", open the app, select [facility], and log in with your MyChart username and password.

## 2022-10-08 ENCOUNTER — Other Ambulatory Visit: Payer: Self-pay

## 2022-10-09 ENCOUNTER — Other Ambulatory Visit: Payer: Self-pay

## 2022-10-09 ENCOUNTER — Inpatient Hospital Stay: Payer: Medicare (Managed Care) | Attending: Hematology

## 2022-10-09 VITALS — BP 122/66 | HR 62 | Temp 98.1°F | Resp 16

## 2022-10-09 DIAGNOSIS — Z5111 Encounter for antineoplastic chemotherapy: Secondary | ICD-10-CM | POA: Insufficient documentation

## 2022-10-09 DIAGNOSIS — T451X5D Adverse effect of antineoplastic and immunosuppressive drugs, subsequent encounter: Secondary | ICD-10-CM | POA: Insufficient documentation

## 2022-10-09 DIAGNOSIS — C787 Secondary malignant neoplasm of liver and intrahepatic bile duct: Secondary | ICD-10-CM | POA: Insufficient documentation

## 2022-10-09 DIAGNOSIS — Z5112 Encounter for antineoplastic immunotherapy: Secondary | ICD-10-CM | POA: Diagnosis present

## 2022-10-09 DIAGNOSIS — Z90710 Acquired absence of both cervix and uterus: Secondary | ICD-10-CM | POA: Insufficient documentation

## 2022-10-09 DIAGNOSIS — C221 Intrahepatic bile duct carcinoma: Secondary | ICD-10-CM

## 2022-10-09 DIAGNOSIS — G620 Drug-induced polyneuropathy: Secondary | ICD-10-CM | POA: Insufficient documentation

## 2022-10-09 DIAGNOSIS — Z79899 Other long term (current) drug therapy: Secondary | ICD-10-CM | POA: Diagnosis not present

## 2022-10-09 DIAGNOSIS — Z5189 Encounter for other specified aftercare: Secondary | ICD-10-CM | POA: Diagnosis not present

## 2022-10-09 DIAGNOSIS — C19 Malignant neoplasm of rectosigmoid junction: Secondary | ICD-10-CM | POA: Insufficient documentation

## 2022-10-09 DIAGNOSIS — C7800 Secondary malignant neoplasm of unspecified lung: Secondary | ICD-10-CM | POA: Insufficient documentation

## 2022-10-09 MED ORDER — PEGFILGRASTIM-CBQV 6 MG/0.6ML ~~LOC~~ SOSY
6.0000 mg | PREFILLED_SYRINGE | Freq: Once | SUBCUTANEOUS | Status: AC
  Administered 2022-10-09: 6 mg via SUBCUTANEOUS
  Filled 2022-10-09: qty 0.6

## 2022-10-09 MED ORDER — SODIUM CHLORIDE 0.9% FLUSH
10.0000 mL | INTRAVENOUS | Status: DC | PRN
  Administered 2022-10-09: 10 mL

## 2022-10-09 MED ORDER — HEPARIN SOD (PORK) LOCK FLUSH 100 UNIT/ML IV SOLN
500.0000 [IU] | Freq: Once | INTRAVENOUS | Status: AC | PRN
  Administered 2022-10-09: 500 [IU]

## 2022-10-20 MED FILL — Dexamethasone Sodium Phosphate Inj 100 MG/10ML: INTRAMUSCULAR | Qty: 1 | Status: AC

## 2022-10-21 ENCOUNTER — Other Ambulatory Visit: Payer: Self-pay

## 2022-10-21 ENCOUNTER — Inpatient Hospital Stay: Payer: Medicare (Managed Care) | Admitting: Hematology

## 2022-10-21 ENCOUNTER — Inpatient Hospital Stay: Payer: Medicare (Managed Care)

## 2022-10-21 ENCOUNTER — Encounter: Payer: Self-pay | Admitting: Hematology

## 2022-10-21 VITALS — BP 131/80 | HR 70 | Temp 98.5°F | Resp 18 | Ht 66.0 in | Wt 178.8 lb

## 2022-10-21 DIAGNOSIS — C221 Intrahepatic bile duct carcinoma: Secondary | ICD-10-CM

## 2022-10-21 DIAGNOSIS — G620 Drug-induced polyneuropathy: Secondary | ICD-10-CM | POA: Diagnosis not present

## 2022-10-21 DIAGNOSIS — T451X5A Adverse effect of antineoplastic and immunosuppressive drugs, initial encounter: Secondary | ICD-10-CM

## 2022-10-21 DIAGNOSIS — Z5112 Encounter for antineoplastic immunotherapy: Secondary | ICD-10-CM | POA: Diagnosis not present

## 2022-10-21 DIAGNOSIS — Z95828 Presence of other vascular implants and grafts: Secondary | ICD-10-CM

## 2022-10-21 DIAGNOSIS — C189 Malignant neoplasm of colon, unspecified: Secondary | ICD-10-CM

## 2022-10-21 LAB — CMP (CANCER CENTER ONLY)
ALT: 12 U/L (ref 0–44)
AST: 12 U/L — ABNORMAL LOW (ref 15–41)
Albumin: 4.1 g/dL (ref 3.5–5.0)
Alkaline Phosphatase: 111 U/L (ref 38–126)
Anion gap: 6 (ref 5–15)
BUN: 9 mg/dL (ref 8–23)
CO2: 29 mmol/L (ref 22–32)
Calcium: 9.4 mg/dL (ref 8.9–10.3)
Chloride: 104 mmol/L (ref 98–111)
Creatinine: 0.72 mg/dL (ref 0.44–1.00)
GFR, Estimated: >60 mL/min (ref >60)
Glucose, Bld: 128 mg/dL — ABNORMAL HIGH (ref 70–99)
Potassium: 3.8 mmol/L (ref 3.5–5.1)
Sodium: 139 mmol/L (ref 135–145)
Total Bilirubin: 0.2 mg/dL — ABNORMAL LOW (ref 0.3–1.2)
Total Protein: 7.3 g/dL (ref 6.5–8.1)

## 2022-10-21 LAB — CBC WITH DIFFERENTIAL (CANCER CENTER ONLY)
Abs Immature Granulocytes: 0.24 10*3/uL — ABNORMAL HIGH (ref 0.00–0.07)
Basophils Absolute: 0.1 10*3/uL (ref 0.0–0.1)
Basophils Relative: 1 %
Eosinophils Absolute: 0.2 10*3/uL (ref 0.0–0.5)
Eosinophils Relative: 2 %
HCT: 36.1 % (ref 36.0–46.0)
Hemoglobin: 11.8 g/dL — ABNORMAL LOW (ref 12.0–15.0)
Immature Granulocytes: 2 %
Lymphocytes Relative: 16 %
Lymphs Abs: 1.8 10*3/uL (ref 0.7–4.0)
MCH: 30.1 pg (ref 26.0–34.0)
MCHC: 32.7 g/dL (ref 30.0–36.0)
MCV: 92.1 fL (ref 80.0–100.0)
Monocytes Absolute: 0.9 10*3/uL (ref 0.1–1.0)
Monocytes Relative: 8 %
Neutro Abs: 7.7 10*3/uL (ref 1.7–7.7)
Neutrophils Relative %: 71 %
Platelet Count: 309 10*3/uL (ref 150–400)
RBC: 3.92 MIL/uL (ref 3.87–5.11)
RDW: 17.2 % — ABNORMAL HIGH (ref 11.5–15.5)
WBC Count: 10.8 10*3/uL — ABNORMAL HIGH (ref 4.0–10.5)
nRBC: 0 % (ref 0.0–0.2)

## 2022-10-21 LAB — MAGNESIUM: Magnesium: 1.8 mg/dL (ref 1.7–2.4)

## 2022-10-21 LAB — CEA (ACCESS): CEA (CHCC): 4.44 ng/mL (ref 0.00–5.00)

## 2022-10-21 LAB — TOTAL PROTEIN, URINE DIPSTICK: Protein, ur: NEGATIVE mg/dL

## 2022-10-21 MED ORDER — ATROPINE SULFATE 1 MG/ML IV SOLN
0.5000 mg | Freq: Once | INTRAVENOUS | Status: AC | PRN
  Administered 2022-10-21: 0.5 mg via INTRAVENOUS
  Filled 2022-10-21: qty 1

## 2022-10-21 MED ORDER — PALONOSETRON HCL INJECTION 0.25 MG/5ML
0.2500 mg | Freq: Once | INTRAVENOUS | Status: AC
  Administered 2022-10-21: 0.25 mg via INTRAVENOUS
  Filled 2022-10-21: qty 5

## 2022-10-21 MED ORDER — SODIUM CHLORIDE 0.9 % IV SOLN
10.0000 mg | Freq: Once | INTRAVENOUS | Status: AC
  Administered 2022-10-21: 10 mg via INTRAVENOUS
  Filled 2022-10-21: qty 10

## 2022-10-21 MED ORDER — SODIUM CHLORIDE 0.9% FLUSH
10.0000 mL | INTRAVENOUS | Status: DC | PRN
  Administered 2022-10-21: 10 mL

## 2022-10-21 MED ORDER — SODIUM CHLORIDE 0.9% FLUSH
10.0000 mL | Freq: Once | INTRAVENOUS | Status: AC
  Administered 2022-10-21: 10 mL

## 2022-10-21 MED ORDER — SODIUM CHLORIDE 0.9 % IV SOLN
180.0000 mg/m2 | Freq: Once | INTRAVENOUS | Status: AC
  Administered 2022-10-21: 360 mg via INTRAVENOUS
  Filled 2022-10-21: qty 3

## 2022-10-21 MED ORDER — SODIUM CHLORIDE 0.9 % IV SOLN: Freq: Once | INTRAVENOUS | Status: AC

## 2022-10-21 MED ORDER — SODIUM CHLORIDE 0.9 % IV SOLN
5.0000 mg/kg | Freq: Once | INTRAVENOUS | Status: AC
  Administered 2022-10-21: 400 mg via INTRAVENOUS
  Filled 2022-10-21: qty 16

## 2022-10-21 MED ORDER — SODIUM CHLORIDE 0.9 % IV SOLN
400.0000 mg/m2 | Freq: Once | INTRAVENOUS | Status: AC
  Administered 2022-10-21: 780 mg via INTRAVENOUS
  Filled 2022-10-21: qty 25

## 2022-10-21 MED ORDER — SODIUM CHLORIDE 0.9 % IV SOLN
2400.0000 mg/m2 | INTRAVENOUS | Status: DC
  Administered 2022-10-21: 5000 mg via INTRAVENOUS
  Filled 2022-10-21: qty 100

## 2022-10-21 NOTE — Progress Notes (Signed)
Bevacizumab diluted in NS118mL, Exp:  02/06/24, Lot:  [id number].  Anola Gurney [place], [place], BCPS, BCOP 10/21/2022 10:10 AM

## 2022-10-21 NOTE — Patient Instructions (Signed)
[facility]  Discharge Instructions: Thank you for choosing [facility] to provide your oncology and hematology care.   If you have a lab appointment with the Cancer Center, please go directly to the Cancer Center and check in at the registration area.   Wear comfortable clothing and clothing appropriate for easy access to any Portacath or PICC line.   We strive to give you quality time with your provider. You may need to reschedule your appointment if you arrive late (15 or more minutes).  Arriving late affects you and other patients whose appointments are after yours.  Also, if you miss three or more appointments without notifying the office, you may be dismissed from the clinic at the provider's discretion.      For prescription refill requests, have your pharmacy contact our office and allow 72 hours for refills to be completed.    Today you received the following chemotherapy and/or immunotherapy agents: MVASI/Irinotecan/Leucovorin/Fluorouracil      To help prevent nausea and vomiting after your treatment, we encourage you to take your nausea medication as directed.  BELOW ARE SYMPTOMS THAT SHOULD BE REPORTED IMMEDIATELY: *FEVER GREATER THAN 100.4 F (38 C) OR HIGHER *CHILLS OR SWEATING *NAUSEA AND VOMITING THAT IS NOT CONTROLLED WITH YOUR NAUSEA MEDICATION *UNUSUAL SHORTNESS OF BREATH *UNUSUAL BRUISING OR BLEEDING *URINARY PROBLEMS (pain or burning when urinating, or frequent urination) *BOWEL PROBLEMS (unusual diarrhea, constipation, pain near the anus) TENDERNESS IN MOUTH AND THROAT WITH OR WITHOUT PRESENCE OF ULCERS (sore throat, sores in mouth, or a toothache) UNUSUAL RASH, SWELLING OR PAIN  UNUSUAL VAGINAL DISCHARGE OR ITCHING   Items with * indicate a potential emergency and should be followed up as soon as possible or go to the Emergency Department if any problems should occur.  Please show the CHEMOTHERAPY ALERT CARD or  IMMUNOTHERAPY ALERT CARD at check-in to the Emergency Department and triage nurse.  Should you have questions after your visit or need to cancel or reschedule your appointment, please contact [facility]  Dept: [phone]  and follow the prompts.  Office hours are 8:00 a.m. to 4:30 p.m. Monday - Friday. Please note that voicemails left after 4:00 p.m. may not be returned until the following business day.  We are closed weekends and major holidays. You have access to a nurse at all times for urgent questions. Please call the main number to the clinic Dept: [phone] and follow the prompts.   For any non-urgent questions, you may also contact your provider using MyChart. We now offer e-Visits for anyone 17 and older to request care online for non-urgent symptoms. For details visit [email or web address].   Also download the MyChart app! Go to the app store, search "MyChart", open the app, select [facility], and log in with your MyChart username and password.

## 2022-10-21 NOTE — Progress Notes (Signed)
[facility]   [phone] Fax:([phone]) [phone]   Clinic Follow up Note   Patient Care Team: Ralene Ok, MD as PCP - General (Internal Medicine) Radonna Ricker, RN (Inactive) as Oncology Nurse Navigator Malachy Mood, MD as Consulting Physician (Oncology) Jeani Hawking, MD as Consulting Physician (Gastroenterology)  Date of Service:  10/21/2022  CHIEF COMPLAINT: f/u of metastatic colon cancer   CURRENT THERAPY:  FOLFIRI/Beva q14 days, started 03/25/22   ASSESSMENT:  Lindsay [name] is a 67 y.o. 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 Liver biopsy 01/08/20 confirmed metastasis from primary colorectal cancer. PET scan showed hypermetabolism to known liver mets, diffuse thoracic and abdominal lymphadenopathy, a 1.8 cm LUL pulmonary nodule, and splenic flexure of colon. -treated with first line FOLFOX 01/25/20 - 10/02/20, Vectibix added with C2. Oxali discontinued after C18 due to reaction. -switched to Xeloda 10/21/20, dose adjusted due to significant skin toxicity -due to cancer progression on recent staging scan, treatment has been changed to FOLFIRI and bevacizumab on 03/25/22, she is overall tolerating well -will repeat CT scan after next cycle chemo  -Restaging CT scan from 06/16/2022 showed stable to mild decrease in size of treated liver metastasis. No new lesions -She is tolerating chemotherapy well overall, will continue -Her restaging CT scan on August 31, 2022 showed stable liver metastasis, and a stable 7 mm left lung nodule, indeterminate.  I personally reviewed the scan images with patient -Will continue current therapy.  We discussed the option of maintenance therapy with irinotecan and bevacizumab in future.  She is tolerating  current FOLFIRI and bevacizumab well, will continue.     PLAN: -lab reviewed -CMP-pending -Repeat CT scan in OCT, will order in September -proceed with [facility] today, she will take a month of chemo break after due to her trip  -lab/flush and f/u 11/25/2022  SUMMARY OF ONCOLOGIC HISTORY: Oncology History  metastatic colon cancer to liver  06/20/2019 Procedure   Colonoscopy by Dr Meridee Score 06/20/19  IMPRESSION -Seven 3 to 10 mm polyps in the sigmoid colon, in the transverse colon and in the escending colon, removed with a cold snare. Resected and retrireved.  -One 20mm polyp in the descending colon. Biopsies. Tattoes.  -Mediaum sized lipoma in the ascending colon.   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 See Comment B. Colon, Ascending, polyp, Polypectomy:  -TUBULAR ADENOMA -No high grade dysplasia or malignancy.  C. Colon, TRansverse, Polyo, polectomy:  -TUBULAR ADENOMA -No high grade dysplasia or malignancy.  D. Colon, Sigmoid, Polyp, Polypectomy:  -HYPERPLASTIC POLYP   11/07/2019 Imaging   US Abdomen 11/07/19  IMPRESSION: 1. Two solid masses in the liver are nonspecific. Recommend MRI abdomen with and without contrast for further evaluation.   12/15/2019 Imaging   MRI Abdomen 12/15/19  IMPRESSION: 1. There are two large masses in the liver with appearance favoring metastatic disease or hepatocellular carcinoma or cholangiocarcinoma. A benign etiology is highly unlikely given the enhancement pattern and associated adenopathy. 2. Considerable porta hepatis and retroperitoneal adenopathy. Some of the confluent porta hepatis tumor is potentially infiltrative and abuts the pancreatic body along its right upper margin, making it difficult to completely exclude the possibility of pancreatic adenocarcinoma primary. Possibilities helpful in  further workup might include tissue diagnosis, endoscopic  ultrasound, or nuclear medicine PET-CT. 3. Pancreas divisum. 4. Lumbar spondylosis and degenerative disc disease. 5. Despite efforts by the technologist and patient, motion artifact is present on today's exam and could not be eliminated. This reduces exam sensitivity and specificity.   12/22/2019 Procedure   Upper Endoscopy by Dr Elnoria Howard 12/22/19  IMPRESSION - One lymph node was visualized and measured in the porta hepatis region. Fine needle aspiration performed    12/22/2019 Initial Biopsy   A. LIVER, PORTA HEPATIS MASS, FINE NEEDLE 12/22/19 ASPIRATION:  FINAL MICROSCOPIC DIAGNOSIS:  - Malignant cells consistent with metastatic adenocarcinoma   01/01/2020 Initial Diagnosis   Intrahepatic cholangiocarcinoma (HCC)   01/08/2020 Initial Biopsy   FINAL MICROSCOPIC DIAGNOSIS:   A. LIVER, LEFT LOBE, BIOPSY:  - Metastatic adenocarcinoma, consistent with a colorectal primary.  See  comment      COMMENT:   Immunohistochemical stains show the tumor cells are positive for CK20  and CDX2 but negative for CK7, consistent with above interpretation.  Dr. Mosetta Putt was notified on 01/10/2020   01/08/2020 Genetic Testing   [name]  [name] wildtype and [name]/[name] mutations which make her eligible for target biological agent Vectibix.   01/24/2020 Procedure   PAC placed 01/24/20   01/25/2020 -  Chemotherapy   first line FOLFOX starting 01/25/2020, Vextibix  added with C2 (02/06/20)   02/06/2020 - 02/06/2022 Chemotherapy   Patient is on Treatment Plan : COLORECTAL Panitumumab q14d ([name] Wild - Type Gene Only)     06/20/2020 Imaging   CT CAP  IMPRESSION: 1. Continued interval reduction in size and conspicuity of a subsolid nodule of the peripheral left upper lobe. 2. Unchanged prominent pretracheal and subcarinal lymph nodes. 3. Redemonstrated partially calcified low-attenuation liver masses, slightly decreased in size compared to prior examination. 4. Slight interval decrease in size  of a portacaval lymph node or conglomerate and retroperitoneal lymph nodes. 5. Findings are consistent with continued treatment response of nodal, pulmonary, and hepatic metastatic disease. 6. Coronary artery disease.   Aortic Atherosclerosis (ICD10-I70.0).     10/09/2020 Imaging   IMPRESSION: 1. Slight decrease in size of dominant liver mass with smaller liver mass within 1-2 mm of prior measurement. 2. Stable appearance of celiac lymph and retroperitoneal lymph nodes. Dominant node with calcification in the gastrohepatic ligament as described. 3. Continued decrease in size of LEFT upper lobe nodule. 4. Three-vessel coronary artery calcification. 5. Aortic atherosclerosis.   03/25/2022 -  Chemotherapy   Patient is on Treatment Plan : COLORECTAL FOLFIRI + Bevacizumab q14d     08/31/2022 Imaging    IMPRESSION: 1. Unchanged, densely calcified liver lesions, consistent with treated metastatic disease. 2. Unchanged, irregular subpleural opacity of the peripheral left upper lobe. 3. Unchanged subcentimeter gastrohepatic ligament lymph node. 4. No evidence of new metastatic disease in the chest, abdomen, or pelvis. 5. Status post hysterectomy. 6. Coronary artery disease. 7. Aortic valve calcifications. Correlate for echocardiographic evidence of aortic valve dysfunction.        INTERVAL HISTORY:  Lindsay [name] is here for a follow up of metastatic colon cancer. She was last seen by me on 10/07/2022. She presents to the clinic alone. Pt state she has no issues from last treatment. Pt report of some nausea, but she take the antinausea medicine that was prescribe.      All other systems were reviewed with the patient and are negative.  MEDICAL HISTORY:  Past Medical History:  Diagnosis Date  Arthritis    Diabetes mellitus without complication (HCC)    Hypertension    met colon ca to liver 01/2020    SURGICAL HISTORY: Past Surgical History:  Procedure Laterality Date    ABDOMINAL HYSTERECTOMY     ANTERIOR AND POSTERIOR REPAIR WITH SACROSPINOUS FIXATION N/A 07/16/2016   Procedure: ANTERIOR AND POSTERIOR REPAIR WITH SACROSPINOUS FIXATION;  Surgeon: Harold Hedge, MD;  Location: [facility];  Service: Gynecology;  Laterality: N/A;   CYSTOSCOPY  07/16/2016   Procedure: CYSTOSCOPY;  Surgeon: Harold Hedge, MD;  Location: [facility];  Service: Gynecology;;   ESOPHAGOGASTRODUODENOSCOPY (EGD) WITH PROPOFOL N/A 12/22/2019   Procedure: ESOPHAGOGASTRODUODENOSCOPY (EGD) WITH PROPOFOL;  Surgeon: Jeani Hawking, MD;  Location: [facility];  Service: Endoscopy;  Laterality: N/A;   FINE NEEDLE ASPIRATION N/A 12/22/2019   Procedure: FINE NEEDLE ASPIRATION (FNA) LINEAR;  Surgeon: Jeani Hawking, MD;  Location: [facility];  Service: Endoscopy;  Laterality: N/A;   IR IMAGING GUIDED PORT INSERTION  01/24/2020   LAPAROSCOPIC VAGINAL HYSTERECTOMY WITH SALPINGECTOMY Bilateral 07/16/2016   Procedure: LAPAROSCOPIC ASSISTED VAGINAL HYSTERECTOMY WITH SALPINGECTOMY;  Surgeon: Harold Hedge, MD;  Location: [facility];  Service: Gynecology;  Laterality: Bilateral;   MYOMECTOMY ABDOMINAL APPROACH     THYROID SURGERY     tyroid     UPPER ESOPHAGEAL ENDOSCOPIC ULTRASOUND (EUS) N/A 12/22/2019   Procedure: UPPER ESOPHAGEAL ENDOSCOPIC ULTRASOUND (EUS);  Surgeon: Jeani Hawking, MD;  Location: [facility];  Service: Endoscopy;  Laterality: N/A;    I have reviewed the social history and family history with the patient and they are unchanged from previous note.  ALLERGIES:  is allergic to oxaliplatin.  MEDICATIONS:  Current Outpatient Medications  Medication Sig Dispense Refill   aspirin EC 81 MG tablet Take 1 tablet (81 mg total) by mouth daily. 90 tablet 3   atorvastatin (LIPITOR) 20 MG tablet Take 20 mg by mouth at bedtime.   1   clindamycin (CLINDAGEL) 1 % gel Apply topically 2 (two) times daily. 60 g 2   clobetasol cream (TEMOVATE) 0.05 % Apply 1 application topically daily.      hydrochlorothiazide (MICROZIDE) 12.5 MG capsule Take 12.5 mg by mouth daily.     KLOR-CON M20 20 MEQ tablet TAKE 1 TABLET TWICE A DAY 180 tablet 3   lidocaine-prilocaine (EMLA) cream Apply 1 Application topically as needed. 30 g 1   magic mouthwash w/lidocaine SOLN Take 5 mLs by mouth 4 (four) times daily. 475 mL 2   MAGNESIUM-OXIDE 400 (240 Mg) MG tablet TAKE 2 TABLETS BY MOUTH 4 TIMES DAILY 240 tablet 0   omeprazole (PRILOSEC) 20 MG capsule Take 1 capsule (20 mg total) by mouth daily. (Patient not taking: Reported on 06/03/2022) 30 capsule 2   ondansetron (ZOFRAN) 8 MG tablet Take 1 tablet (8 mg total) by mouth every 8 (eight) hours as needed. 20 tablet 0   prochlorperazine (COMPAZINE) 10 MG tablet Take 1 tablet (10 mg total) by mouth every 6 (six) hours as needed for nausea or vomiting. 30 tablet 2   spironolactone (ALDACTONE) 25 MG tablet Take 25 mg by mouth daily with breakfast.     urea (CARMOL) 10 % cream Apply topically 2 (two) times daily. 71 g 2   valACYclovir (VALTREX) 1000 MG tablet Take 1 tablet (1,000 mg total) by mouth 2 (two) times daily. 10 tablet 0   verapamil (CALAN) 120 MG tablet Take 120 mg by mouth 3 (three) times daily.     No current facility-administered medications for this visit.  Facility-Administered Medications Ordered in Other Visits  Medication Dose Route Frequency Provider Last Rate Last Admin   atropine injection 0.5 mg  0.5 mg Intravenous Once PRN Malachy Mood, MD       fluorouracil (ADRUCIL) 5,000 mg in sodium chloride 0.9 % 150 mL chemo infusion  2,400 mg/m2 (Treatment Plan Recorded) Intravenous 1 day or 1 dose Malachy Mood, MD       irinotecan (CAMPTOSAR) 360 mg in sodium chloride 0.9 % 500 mL chemo infusion  180 mg/m2 (Treatment Plan Recorded) Intravenous Once Malachy Mood, MD       leucovorin 780 mg in sodium chloride 0.9 % 250 mL infusion  400 mg/m2 (Treatment Plan Recorded) Intravenous Once Malachy Mood, MD       sodium chloride flush (NS) 0.9 % injection 10 mL  10  mL Intracatheter PRN Malachy Mood, MD        PHYSICAL EXAMINATION: ECOG PERFORMANCE STATUS: 1 - Symptomatic but completely ambulatory  Vitals:   10/21/22 0916  BP: 131/80  Pulse: 70  Resp: 18  Temp: 98.5 F (36.9 C)  SpO2: 100%   Wt Readings from Last 3 Encounters:  10/21/22 178 lb 12.8 oz (81.1 kg)  10/07/22 182 lb (82.6 kg)  09/23/22 182 lb 8 oz (82.8 kg)     GENERAL:alert, no distress and comfortable SKIN: skin color normal, no rashes or significant lesions EYES: normal, Conjunctiva are pink and non-injected, sclera clear  NEURO: alert & oriented x 3 with fluent speech LABORATORY DATA:  I have reviewed the data as listed    Latest Ref Rng & Units 10/21/2022    8:55 AM 10/07/2022    9:10 AM 09/23/2022    8:47 AM  CBC  WBC 4.0 - 10.5 K/uL 10.8  11.1  9.1   Hemoglobin 12.0 - 15.0 g/dL 46.9  62.9  52.8   Hematocrit 36.0 - 46.0 % 36.1  35.4  35.3   Platelets 150 - 400 K/uL 309  276  288         Latest Ref Rng & Units 10/21/2022    8:55 AM 10/07/2022    9:10 AM 09/23/2022    8:47 AM  CMP  Glucose 70 - 99 mg/dL 413  244  010   BUN 8 - 23 mg/dL 9  9  10    Creatinine 0.44 - 1.00 mg/dL 2.72  5.36  6.44   Sodium 135 - 145 mmol/L 139  139  140   Potassium 3.5 - 5.1 mmol/L 3.8  3.9  3.8   Chloride 98 - 111 mmol/L 104  106  106   CO2 22 - 32 mmol/L 29  28  27    Calcium 8.9 - 10.3 mg/dL 9.4  9.6  9.3   Total Protein 6.5 - 8.1 g/dL 7.3  7.1  6.9   Total Bilirubin 0.3 - 1.2 mg/dL 0.2  0.2  0.2   Alkaline Phos 38 - 126 U/L 111  113  99   AST 15 - 41 U/L 12  13  11    ALT 0 - 44 U/L 12  14  12        RADIOGRAPHIC STUDIES: I have personally reviewed the radiological images as listed and agreed with the findings in the report. No results found.    Orders Placed This Encounter  Procedures   CBC with Differential (Cancer Center Only)    Standing Status:   Future    Standing Expiration Date:   12/23/2023   CMP (Cancer Center only)  Standing Status:   Future    Standing  Expiration Date:   12/23/2023   Total Protein, Urine dipstick    Standing Status:   Future    Standing Expiration Date:   12/23/2023   All questions were answered. The patient knows to call the clinic with any problems, questions or concerns. No barriers to learning was detected. The total time spent in the appointment was 25 minutes.     Malachy Mood, MD 10/21/2022   Carolin Coy, CMA, am acting as scribe for Malachy Mood, MD.   I have reviewed the above documentation for accuracy and completeness, and I agree with the above.

## 2022-10-21 NOTE — Assessment & Plan Note (Signed)
MSS, [name]/[name] wildtype -diagnosed 12/2019 by porta hepatis LN biopsy during EUS for work up of abdominal pain and liver lesions seen on [place] and MRI. Liver biopsy 01/08/20 confirmed metastasis from primary colorectal cancer. PET scan showed hypermetabolism to known liver mets, diffuse thoracic and abdominal lymphadenopathy, a 1.8 cm LUL pulmonary nodule, and splenic flexure of colon. -treated with first line FOLFOX 01/25/20 - 10/02/20, Vectibix added with C2. Oxali discontinued after C18 due to reaction. -switched to Xeloda 10/21/20, dose adjusted due to significant skin toxicity -due to cancer progression on recent staging scan, treatment has been changed to FOLFIRI and bevacizumab on 03/25/22, she is overall tolerating well -will repeat CT scan after next cycle chemo  -Restaging CT scan from 06/16/2022 showed stable to mild decrease in size of treated liver metastasis. No new lesions -She is tolerating chemotherapy well overall, will continue -Her restaging CT scan on August 31, 2022 showed stable liver metastasis, and a stable 7 mm left lung nodule, indeterminate.  I personally reviewed the scan images with patient -Will continue current therapy.  We discussed the option of maintenance therapy with irinotecan and bevacizumab in future.  She is tolerating current FOLFIRI and bevacizumab well, will continue.

## 2022-10-21 NOTE — Assessment & Plan Note (Signed)
-  overall mild and stable

## 2022-10-22 ENCOUNTER — Other Ambulatory Visit: Payer: Self-pay

## 2022-10-23 ENCOUNTER — Other Ambulatory Visit: Payer: Self-pay

## 2022-10-23 ENCOUNTER — Inpatient Hospital Stay: Payer: Medicare (Managed Care)

## 2022-10-23 VITALS — BP 148/80 | HR 56 | Temp 98.4°F | Resp 16

## 2022-10-23 DIAGNOSIS — Z5112 Encounter for antineoplastic immunotherapy: Secondary | ICD-10-CM | POA: Diagnosis not present

## 2022-10-23 DIAGNOSIS — C221 Intrahepatic bile duct carcinoma: Secondary | ICD-10-CM

## 2022-10-23 MED ORDER — PEGFILGRASTIM-CBQV 6 MG/0.6ML ~~LOC~~ SOSY
6.0000 mg | PREFILLED_SYRINGE | Freq: Once | SUBCUTANEOUS | Status: AC
  Administered 2022-10-23: 6 mg via SUBCUTANEOUS
  Filled 2022-10-23: qty 0.6

## 2022-10-23 MED ORDER — HEPARIN SOD (PORK) LOCK FLUSH 100 UNIT/ML IV SOLN
500.0000 [IU] | Freq: Once | INTRAVENOUS | Status: AC | PRN
  Administered 2022-10-23: 500 [IU]

## 2022-10-23 MED ORDER — SODIUM CHLORIDE 0.9% FLUSH
10.0000 mL | INTRAVENOUS | Status: DC | PRN
  Administered 2022-10-23: 10 mL

## 2022-11-03 ENCOUNTER — Other Ambulatory Visit: Payer: Self-pay

## 2022-11-04 ENCOUNTER — Ambulatory Visit: Payer: Medicare (Managed Care) | Admitting: Hematology

## 2022-11-04 ENCOUNTER — Other Ambulatory Visit: Payer: Medicare (Managed Care)

## 2022-11-04 ENCOUNTER — Ambulatory Visit: Payer: Medicare (Managed Care)

## 2022-11-18 ENCOUNTER — Other Ambulatory Visit: Payer: Medicare (Managed Care)

## 2022-11-18 ENCOUNTER — Ambulatory Visit: Payer: Medicare (Managed Care)

## 2022-11-18 ENCOUNTER — Ambulatory Visit: Payer: Medicare (Managed Care) | Admitting: Hematology

## 2022-11-24 MED FILL — Dexamethasone Sodium Phosphate Inj 100 MG/10ML: INTRAMUSCULAR | Qty: 1 | Status: AC

## 2022-11-24 NOTE — Assessment & Plan Note (Signed)
-  overall mild and stable

## 2022-11-24 NOTE — Assessment & Plan Note (Signed)
MSS, [name]/[name] wildtype -diagnosed 12/2019 by porta hepatis LN biopsy during EUS for work up of abdominal pain and liver lesions seen on [place] and MRI. Liver biopsy 01/08/20 confirmed metastasis from primary colorectal cancer. PET scan showed hypermetabolism to known liver mets, diffuse thoracic and abdominal lymphadenopathy, a 1.8 cm LUL pulmonary nodule, and splenic flexure of colon. -treated with first line FOLFOX 01/25/20 - 10/02/20, Vectibix added with C2. Oxali discontinued after C18 due to reaction. -switched to Xeloda 10/21/20, dose adjusted due to significant skin toxicity -due to cancer progression on recent staging scan, treatment has been changed to FOLFIRI and bevacizumab on 03/25/22, she is overall tolerating well -will repeat CT scan after next cycle chemo  -Restaging CT scan from 06/16/2022 showed stable to mild decrease in size of treated liver metastasis. No new lesions -She is tolerating chemotherapy well overall, will continue -Her restaging CT scan on August 31, 2022 showed stable liver metastasis, and a stable 7 mm left lung nodule, indeterminate.  I personally reviewed the scan images with patient -Will continue current therapy.  We discussed the option of maintenance therapy with irinotecan and bevacizumab in future.  She is tolerating current FOLFIRI and bevacizumab well, will continue.  -Will repeat a CT scan in 3 to 4 weeks

## 2022-11-25 ENCOUNTER — Encounter: Payer: Self-pay | Admitting: Hematology

## 2022-11-25 ENCOUNTER — Inpatient Hospital Stay: Payer: Medicare (Managed Care)

## 2022-11-25 ENCOUNTER — Inpatient Hospital Stay: Payer: Medicare (Managed Care) | Attending: Hematology | Admitting: Hematology

## 2022-11-25 VITALS — BP 131/82 | HR 72 | Temp 98.4°F | Resp 18 | Ht 66.0 in | Wt 181.3 lb

## 2022-11-25 DIAGNOSIS — Z5189 Encounter for other specified aftercare: Secondary | ICD-10-CM | POA: Diagnosis not present

## 2022-11-25 DIAGNOSIS — Z79899 Other long term (current) drug therapy: Secondary | ICD-10-CM | POA: Insufficient documentation

## 2022-11-25 DIAGNOSIS — Z5112 Encounter for antineoplastic immunotherapy: Secondary | ICD-10-CM | POA: Insufficient documentation

## 2022-11-25 DIAGNOSIS — C787 Secondary malignant neoplasm of liver and intrahepatic bile duct: Secondary | ICD-10-CM | POA: Insufficient documentation

## 2022-11-25 DIAGNOSIS — Z90710 Acquired absence of both cervix and uterus: Secondary | ICD-10-CM | POA: Insufficient documentation

## 2022-11-25 DIAGNOSIS — G620 Drug-induced polyneuropathy: Secondary | ICD-10-CM | POA: Insufficient documentation

## 2022-11-25 DIAGNOSIS — Z95828 Presence of other vascular implants and grafts: Secondary | ICD-10-CM

## 2022-11-25 DIAGNOSIS — Z5111 Encounter for antineoplastic chemotherapy: Secondary | ICD-10-CM | POA: Diagnosis not present

## 2022-11-25 DIAGNOSIS — T451X5A Adverse effect of antineoplastic and immunosuppressive drugs, initial encounter: Secondary | ICD-10-CM

## 2022-11-25 DIAGNOSIS — C189 Malignant neoplasm of colon, unspecified: Secondary | ICD-10-CM

## 2022-11-25 DIAGNOSIS — C221 Intrahepatic bile duct carcinoma: Secondary | ICD-10-CM

## 2022-11-25 DIAGNOSIS — T451X5D Adverse effect of antineoplastic and immunosuppressive drugs, subsequent encounter: Secondary | ICD-10-CM | POA: Diagnosis not present

## 2022-11-25 DIAGNOSIS — C19 Malignant neoplasm of rectosigmoid junction: Secondary | ICD-10-CM | POA: Diagnosis present

## 2022-11-25 DIAGNOSIS — C7800 Secondary malignant neoplasm of unspecified lung: Secondary | ICD-10-CM | POA: Insufficient documentation

## 2022-11-25 LAB — CBC WITH DIFFERENTIAL (CANCER CENTER ONLY)
Abs Immature Granulocytes: 0.01 10*3/uL (ref 0.00–0.07)
Basophils Absolute: 0.1 10*3/uL (ref 0.0–0.1)
Basophils Relative: 1 %
Eosinophils Absolute: 0.1 10*3/uL (ref 0.0–0.5)
Eosinophils Relative: 2 %
HCT: 36.8 % (ref 36.0–46.0)
Hemoglobin: 11.8 g/dL — ABNORMAL LOW (ref 12.0–15.0)
Immature Granulocytes: 0 %
Lymphocytes Relative: 20 %
Lymphs Abs: 1.1 10*3/uL (ref 0.7–4.0)
MCH: 29 pg (ref 26.0–34.0)
MCHC: 32.1 g/dL (ref 30.0–36.0)
MCV: 90.4 fL (ref 80.0–100.0)
Monocytes Absolute: 0.6 10*3/uL (ref 0.1–1.0)
Monocytes Relative: 11 %
Neutro Abs: 3.6 10*3/uL (ref 1.7–7.7)
Neutrophils Relative %: 66 %
Platelet Count: 244 10*3/uL (ref 150–400)
RBC: 4.07 MIL/uL (ref 3.87–5.11)
RDW: 16.1 % — ABNORMAL HIGH (ref 11.5–15.5)
WBC Count: 5.6 10*3/uL (ref 4.0–10.5)
nRBC: 0 % (ref 0.0–0.2)

## 2022-11-25 LAB — TOTAL PROTEIN, URINE DIPSTICK: Protein, ur: NEGATIVE mg/dL

## 2022-11-25 LAB — CMP (CANCER CENTER ONLY)
ALT: 13 U/L (ref 0–44)
AST: 15 U/L (ref 15–41)
Albumin: 4.1 g/dL (ref 3.5–5.0)
Alkaline Phosphatase: 83 U/L (ref 38–126)
Anion gap: 6 (ref 5–15)
BUN: 12 mg/dL (ref 8–23)
CO2: 28 mmol/L (ref 22–32)
Calcium: 9.5 mg/dL (ref 8.9–10.3)
Chloride: 104 mmol/L (ref 98–111)
Creatinine: 0.67 mg/dL (ref 0.44–1.00)
GFR, Estimated: >60 mL/min (ref >60)
Glucose, Bld: 90 mg/dL (ref 70–99)
Potassium: 4 mmol/L (ref 3.5–5.1)
Sodium: 138 mmol/L (ref 135–145)
Total Bilirubin: 0.4 mg/dL (ref 0.3–1.2)
Total Protein: 7.6 g/dL (ref 6.5–8.1)

## 2022-11-25 LAB — CEA (ACCESS): CEA (CHCC): 6.69 ng/mL — ABNORMAL HIGH (ref 0.00–5.00)

## 2022-11-25 LAB — MAGNESIUM: Magnesium: 1.8 mg/dL (ref 1.7–2.4)

## 2022-11-25 MED ORDER — ATROPINE SULFATE 1 MG/ML IV SOLN
0.5000 mg | Freq: Once | INTRAVENOUS | Status: AC | PRN
  Administered 2022-11-25: 0.5 mg via INTRAVENOUS
  Filled 2022-11-25: qty 1

## 2022-11-25 MED ORDER — SODIUM CHLORIDE 0.9 % IV SOLN
10.0000 mg | Freq: Once | INTRAVENOUS | Status: AC
  Administered 2022-11-25: 10 mg via INTRAVENOUS
  Filled 2022-11-25: qty 10

## 2022-11-25 MED ORDER — SODIUM CHLORIDE 0.9 % IV SOLN
2400.0000 mg/m2 | INTRAVENOUS | Status: DC
  Administered 2022-11-25: 5000 mg via INTRAVENOUS
  Filled 2022-11-25: qty 100

## 2022-11-25 MED ORDER — SODIUM CHLORIDE 0.9 % IV SOLN
5.0000 mg/kg | Freq: Once | INTRAVENOUS | Status: AC
  Administered 2022-11-25: 400 mg via INTRAVENOUS
  Filled 2022-11-25: qty 16

## 2022-11-25 MED ORDER — SODIUM CHLORIDE 0.9% FLUSH: 10.0000 mL | INTRAVENOUS | Status: DC | PRN

## 2022-11-25 MED ORDER — SODIUM CHLORIDE 0.9% FLUSH
10.0000 mL | Freq: Once | INTRAVENOUS | Status: AC
  Administered 2022-11-25: 10 mL

## 2022-11-25 MED ORDER — SODIUM CHLORIDE 0.9 % IV SOLN
400.0000 mg/m2 | Freq: Once | INTRAVENOUS | Status: AC
  Administered 2022-11-25: 780 mg via INTRAVENOUS
  Filled 2022-11-25: qty 39

## 2022-11-25 MED ORDER — SODIUM CHLORIDE 0.9 % IV SOLN
180.0000 mg/m2 | Freq: Once | INTRAVENOUS | Status: AC
  Administered 2022-11-25: 360 mg via INTRAVENOUS
  Filled 2022-11-25: qty 3

## 2022-11-25 MED ORDER — SODIUM CHLORIDE 0.9 % IV SOLN: Freq: Once | INTRAVENOUS | Status: AC

## 2022-11-25 MED ORDER — PALONOSETRON HCL INJECTION 0.25 MG/5ML
0.2500 mg | Freq: Once | INTRAVENOUS | Status: AC
  Administered 2022-11-25: 0.25 mg via INTRAVENOUS
  Filled 2022-11-25: qty 5

## 2022-11-25 MED ORDER — HEPARIN SOD (PORK) LOCK FLUSH 100 UNIT/ML IV SOLN: 500.0000 [IU] | Freq: Once | INTRAVENOUS | Status: DC | PRN

## 2022-11-25 NOTE — Progress Notes (Signed)
[facility]   Telephone:([phone] Fax:(336) [phone]   Clinic Follow up Note   Patient Care Team: Ralene Ok, MD as PCP - General (Internal Medicine) Radonna Ricker, RN (Inactive) as Oncology Nurse Navigator Malachy Mood, MD as Consulting Physician (Oncology) Jeani Hawking, MD as Consulting Physician (Gastroenterology)  Date of Service:  11/25/2022  CHIEF COMPLAINT: f/u of  metastatic colon cancer   CURRENT THERAPY:  FOLFIRI/Beva q14 days, started 03/25/22   ASSESSMENT:  Lindsay [name] is a 67 y.o. 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 Liver biopsy 01/08/20 confirmed metastasis from primary colorectal cancer. PET scan showed hypermetabolism to known liver mets, diffuse thoracic and abdominal lymphadenopathy, a 1.8 cm LUL pulmonary nodule, and splenic flexure of colon. -treated with first line FOLFOX 01/25/20 - 10/02/20, Vectibix added with C2. Oxali discontinued after C18 due to reaction. -switched to Xeloda 10/21/20, dose adjusted due to significant skin toxicity -due to cancer progression on recent staging scan, treatment has been changed to FOLFIRI and bevacizumab on 03/25/22, she is overall tolerating well -will repeat CT scan after next cycle chemo  -Restaging CT scan from 06/16/2022 showed stable to mild decrease in size of treated liver metastasis. No new lesions -She is tolerating chemotherapy well overall, will continue -Her restaging CT scan on August 31, 2022 showed stable liver metastasis, and a stable 7 mm left lung nodule, indeterminate.  I personally reviewed the scan images with patient -Will continue current therapy.  We discussed the option of maintenance therapy with irinotecan and bevacizumab in future.  She is tolerating  current FOLFIRI and bevacizumab well, will continue.  -Will repeat a CT scan in 3 to 4 weeks     PLAN: -lab reviewed -CMP-reviewed - I will order CT CAP in 3 weeks -proceed with Folfiri today -lab/flush and f/u on 10/2  SUMMARY OF ONCOLOGIC HISTORY: Oncology History  metastatic colon cancer to liver  06/20/2019 Procedure   Colonoscopy by Dr Meridee Score 06/20/19  IMPRESSION -Seven 3 to 10 mm polyps in the sigmoid colon, in the transverse colon and in the escending colon, removed with a cold snare. Resected and retrireved.  -One 20mm polyp in the descending colon. Biopsies. Tattoes.  -Mediaum sized lipoma in the ascending colon.   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 See Comment B. Colon, Ascending, polyp, Polypectomy:  -TUBULAR ADENOMA -No high grade dysplasia or malignancy.  C. Colon, TRansverse, Polyo, polectomy:  -TUBULAR ADENOMA -No high grade dysplasia or malignancy.  D. Colon, Sigmoid, Polyp, Polypectomy:  -HYPERPLASTIC POLYP   11/07/2019 Imaging   US Abdomen 11/07/19  IMPRESSION: 1. Two solid masses in the liver are nonspecific. Recommend MRI abdomen with and without contrast for further evaluation.   12/15/2019 Imaging   MRI Abdomen 12/15/19  IMPRESSION: 1. There are two large masses in the liver with appearance favoring metastatic disease or hepatocellular carcinoma or cholangiocarcinoma. A benign etiology is highly unlikely given the enhancement pattern and associated adenopathy. 2. Considerable porta hepatis and retroperitoneal adenopathy. Some of the confluent porta hepatis tumor is potentially infiltrative and abuts the pancreatic body along its right upper margin, making it difficult to completely exclude the possibility of pancreatic adenocarcinoma primary. Possibilities helpful in further  workup might include tissue diagnosis, endoscopic ultrasound, or nuclear medicine  PET-CT. 3. Pancreas divisum. 4. Lumbar spondylosis and degenerative disc disease. 5. Despite efforts by the technologist and patient, motion artifact is present on today's exam and could not be eliminated. This reduces exam sensitivity and specificity.   12/22/2019 Procedure   Upper Endoscopy by Dr Elnoria Howard 12/22/19  IMPRESSION - One lymph node was visualized and measured in the porta hepatis region. Fine needle aspiration performed    12/22/2019 Initial Biopsy   A. LIVER, PORTA HEPATIS MASS, FINE NEEDLE 12/22/19 ASPIRATION:  FINAL MICROSCOPIC DIAGNOSIS:  - Malignant cells consistent with metastatic adenocarcinoma   01/01/2020 Initial Diagnosis   Intrahepatic cholangiocarcinoma (HCC)   01/08/2020 Initial Biopsy   FINAL MICROSCOPIC DIAGNOSIS:   A. LIVER, LEFT LOBE, BIOPSY:  - Metastatic adenocarcinoma, consistent with a colorectal primary.  See  comment      COMMENT:   Immunohistochemical stains show the tumor cells are positive for CK20  and CDX2 but negative for CK7, consistent with above interpretation.  Dr. Mosetta Putt was notified on 01/10/2020   01/08/2020 Genetic Testing   [name]  [name] wildtype and [name]/[name] mutations which make her eligible for target biological agent Vectibix.   01/24/2020 Procedure   PAC placed 01/24/20   01/25/2020 -  Chemotherapy   first line FOLFOX starting 01/25/2020, Vextibix  added with C2 (02/06/20)   02/06/2020 - 02/06/2022 Chemotherapy   Patient is on Treatment Plan : COLORECTAL Panitumumab q14d ([name] Wild - Type Gene Only)     06/20/2020 Imaging   CT CAP  IMPRESSION: 1. Continued interval reduction in size and conspicuity of a subsolid nodule of the peripheral left upper lobe. 2. Unchanged prominent pretracheal and subcarinal lymph nodes. 3. Redemonstrated partially calcified low-attenuation liver masses, slightly decreased in size compared to prior examination. 4. Slight interval decrease in size of a portacaval lymph node  or conglomerate and retroperitoneal lymph nodes. 5. Findings are consistent with continued treatment response of nodal, pulmonary, and hepatic metastatic disease. 6. Coronary artery disease.   Aortic Atherosclerosis (ICD10-I70.0).     10/09/2020 Imaging   IMPRESSION: 1. Slight decrease in size of dominant liver mass with smaller liver mass within 1-2 mm of prior measurement. 2. Stable appearance of celiac lymph and retroperitoneal lymph nodes. Dominant node with calcification in the gastrohepatic ligament as described. 3. Continued decrease in size of LEFT upper lobe nodule. 4. Three-vessel coronary artery calcification. 5. Aortic atherosclerosis.   03/25/2022 -  Chemotherapy   Patient is on Treatment Plan : COLORECTAL FOLFIRI + Bevacizumab q14d     08/31/2022 Imaging    IMPRESSION: 1. Unchanged, densely calcified liver lesions, consistent with treated metastatic disease. 2. Unchanged, irregular subpleural opacity of the peripheral left upper lobe. 3. Unchanged subcentimeter gastrohepatic ligament lymph node. 4. No evidence of new metastatic disease in the chest, abdomen, or pelvis. 5. Status post hysterectomy. 6. Coronary artery disease. 7. Aortic valve calcifications. Correlate for echocardiographic evidence of aortic valve dysfunction.        INTERVAL HISTORY:  Lindsay [name] is here for a follow up of  metastatic colon cancer. She was last seen by me on 10/21/2022. She presents to the clinic alone. Pt state that she is doing well. She has no issue from last treatment. Her neuropathy is the same she has no issue picking up objects.      All other systems were reviewed with the patient and are negative.  MEDICAL HISTORY:  Past Medical History:  Diagnosis  Date   Arthritis    Diabetes mellitus without complication (HCC)    Hypertension    met colon ca to liver 01/2020    SURGICAL HISTORY: Past Surgical History:  Procedure Laterality Date   ABDOMINAL  HYSTERECTOMY     ANTERIOR AND POSTERIOR REPAIR WITH SACROSPINOUS FIXATION N/A 07/16/2016   Procedure: ANTERIOR AND POSTERIOR REPAIR WITH SACROSPINOUS FIXATION;  Surgeon: Harold Hedge, MD;  Location: [facility];  Service: Gynecology;  Laterality: N/A;   CYSTOSCOPY  07/16/2016   Procedure: CYSTOSCOPY;  Surgeon: Harold Hedge, MD;  Location: [facility];  Service: Gynecology;;   ESOPHAGOGASTRODUODENOSCOPY (EGD) WITH PROPOFOL N/A 12/22/2019   Procedure: ESOPHAGOGASTRODUODENOSCOPY (EGD) WITH PROPOFOL;  Surgeon: Jeani Hawking, MD;  Location: [facility];  Service: Endoscopy;  Laterality: N/A;   FINE NEEDLE ASPIRATION N/A 12/22/2019   Procedure: FINE NEEDLE ASPIRATION (FNA) LINEAR;  Surgeon: Jeani Hawking, MD;  Location: [facility];  Service: Endoscopy;  Laterality: N/A;   IR IMAGING GUIDED PORT INSERTION  01/24/2020   LAPAROSCOPIC VAGINAL HYSTERECTOMY WITH SALPINGECTOMY Bilateral 07/16/2016   Procedure: LAPAROSCOPIC ASSISTED VAGINAL HYSTERECTOMY WITH SALPINGECTOMY;  Surgeon: Harold Hedge, MD;  Location: [facility];  Service: Gynecology;  Laterality: Bilateral;   MYOMECTOMY ABDOMINAL APPROACH     THYROID SURGERY     tyroid     UPPER ESOPHAGEAL ENDOSCOPIC ULTRASOUND (EUS) N/A 12/22/2019   Procedure: UPPER ESOPHAGEAL ENDOSCOPIC ULTRASOUND (EUS);  Surgeon: Jeani Hawking, MD;  Location: [facility];  Service: Endoscopy;  Laterality: N/A;    I have reviewed the social history and family history with the patient and they are unchanged from previous note.  ALLERGIES:  is allergic to oxaliplatin.  MEDICATIONS:  Current Outpatient Medications  Medication Sig Dispense Refill   aspirin EC 81 MG tablet Take 1 tablet (81 mg total) by mouth daily. 90 tablet 3   atorvastatin (LIPITOR) 20 MG tablet Take 20 mg by mouth at bedtime.   1   clindamycin (CLINDAGEL) 1 % gel Apply topically 2 (two) times daily. 60 g 2   clobetasol cream (TEMOVATE) 0.05 % Apply 1 application topically daily.     hydrochlorothiazide  (MICROZIDE) 12.5 MG capsule Take 12.5 mg by mouth daily.     KLOR-CON M20 20 MEQ tablet TAKE 1 TABLET TWICE A DAY 180 tablet 3   lidocaine-prilocaine (EMLA) cream Apply 1 Application topically as needed. 30 g 1   magic mouthwash w/lidocaine SOLN Take 5 mLs by mouth 4 (four) times daily. 475 mL 2   omeprazole (PRILOSEC) 20 MG capsule Take 1 capsule (20 mg total) by mouth daily. (Patient not taking: Reported on 06/03/2022) 30 capsule 2   ondansetron (ZOFRAN) 8 MG tablet Take 1 tablet (8 mg total) by mouth every 8 (eight) hours as needed. 20 tablet 0   prochlorperazine (COMPAZINE) 10 MG tablet Take 1 tablet (10 mg total) by mouth every 6 (six) hours as needed for nausea or vomiting. 30 tablet 2   spironolactone (ALDACTONE) 25 MG tablet Take 25 mg by mouth daily with breakfast.     urea (CARMOL) 10 % cream Apply topically 2 (two) times daily. 71 g 2   valACYclovir (VALTREX) 1000 MG tablet Take 1 tablet (1,000 mg total) by mouth 2 (two) times daily. 10 tablet 0   verapamil (CALAN) 120 MG tablet Take 120 mg by mouth 3 (three) times daily.     No current facility-administered medications for this visit.   Facility-Administered Medications Ordered in Other Visits  Medication Dose Route Frequency Provider Last Rate Last Admin  fluorouracil (ADRUCIL) 5,000 mg in sodium chloride 0.9 % 150 mL chemo infusion  2,400 mg/m2 (Treatment Plan Recorded) Intravenous 1 day or 1 dose Malachy Mood, MD   Infusion Verify at 11/25/22 1518   heparin lock flush 100 unit/mL  500 Units Intracatheter Once PRN Malachy Mood, MD       sodium chloride flush (NS) 0.9 % injection 10 mL  10 mL Intracatheter PRN Malachy Mood, MD        PHYSICAL EXAMINATION: ECOG PERFORMANCE STATUS: 0 - Asymptomatic  Vitals:   11/25/22 1038  BP: 131/82  Pulse: 72  Resp: 18  Temp: 98.4 F (36.9 C)  SpO2: 100%   Wt Readings from Last 3 Encounters:  11/25/22 181 lb 4.8 oz (82.2 kg)  10/21/22 178 lb 12.8 oz (81.1 kg)  10/07/22 182 lb (82.6 kg)      GENERAL:alert, no distress and comfortable SKIN: skin color normal, no rashes or significant lesions except dark skin in her palms  EYES: normal, Conjunctiva are pink and non-injected, sclera clear  NEURO: alert & oriented x 3 with fluent speech  LABORATORY DATA:  I have reviewed the data as listed    Latest Ref Rng & Units 11/25/2022   10:00 AM 10/21/2022    8:55 AM 10/07/2022    9:10 AM  CBC  WBC 4.0 - 10.5 K/uL 5.6  10.8  11.1   Hemoglobin 12.0 - 15.0 g/dL 45.4  09.8  11.9   Hematocrit 36.0 - 46.0 % 36.8  36.1  35.4   Platelets 150 - 400 K/uL 244  309  276         Latest Ref Rng & Units 11/25/2022   10:00 AM 10/21/2022    8:55 AM 10/07/2022    9:10 AM  CMP  Glucose 70 - 99 mg/dL 90  147  829   BUN 8 - 23 mg/dL 12  9  9    Creatinine 0.44 - 1.00 mg/dL 5.62  1.30  8.65   Sodium 135 - 145 mmol/L 138  139  139   Potassium 3.5 - 5.1 mmol/L 4.0  3.8  3.9   Chloride 98 - 111 mmol/L 104  104  106   CO2 22 - 32 mmol/L 28  29  28    Calcium 8.9 - 10.3 mg/dL 9.5  9.4  9.6   Total Protein 6.5 - 8.1 g/dL 7.6  7.3  7.1   Total Bilirubin 0.3 - 1.2 mg/dL 0.4  0.2  0.2   Alkaline Phos 38 - 126 U/L 83  111  113   AST 15 - 41 U/L 15  12  13    ALT 0 - 44 U/L 13  12  14        RADIOGRAPHIC STUDIES: I have personally reviewed the radiological images as listed and agreed with the findings in the report. No results found.    Orders Placed This Encounter  Procedures   CT CHEST ABDOMEN PELVIS W CONTRAST    Standing Status:   Future    Standing Expiration Date:   11/25/2023    Order Specific Question:   If indicated for the ordered procedure, I authorize the administration of contrast media per Radiology protocol    Answer:   Yes    Order Specific Question:   Does the patient have a contrast media/X-ray dye allergy?    Answer:   No    Order Specific Question:   Preferred imaging location?    Answer:   [facility]  Order Specific Question:   Release to patient    Answer:    Immediate    Order Specific Question:   If indicated for the ordered procedure, I authorize the administration of oral contrast media per Radiology protocol    Answer:   Yes   All questions were answered. The patient knows to call the clinic with any problems, questions or concerns. No barriers to learning was detected. The total time spent in the appointment was 25 minutes.     Malachy Mood, MD 11/25/2022   Carolin Coy, CMA, am acting as scribe for Malachy Mood, MD.   I have reviewed the above documentation for accuracy and completeness, and I agree with the above.

## 2022-11-25 NOTE — Patient Instructions (Signed)
[facility]  Discharge Instructions: Thank you for choosing [facility] to provide your oncology and hematology care.   If you have a lab appointment with the Cancer Center, please go directly to the Cancer Center and check in at the registration area.   Wear comfortable clothing and clothing appropriate for easy access to any Portacath or PICC line.   We strive to give you quality time with your provider. You may need to reschedule your appointment if you arrive late (15 or more minutes).  Arriving late affects you and other patients whose appointments are after yours.  Also, if you miss three or more appointments without notifying the office, you may be dismissed from the clinic at the provider's discretion.      For prescription refill requests, have your pharmacy contact our office and allow 72 hours for refills to be completed.    Today you received the following chemotherapy and/or immunotherapy agents: MVASI/Irinotecan/Leucovorin/Fluorouracil      To help prevent nausea and vomiting after your treatment, we encourage you to take your nausea medication as directed.  BELOW ARE SYMPTOMS THAT SHOULD BE REPORTED IMMEDIATELY: *FEVER GREATER THAN 100.4 F (38 C) OR HIGHER *CHILLS OR SWEATING *NAUSEA AND VOMITING THAT IS NOT CONTROLLED WITH YOUR NAUSEA MEDICATION *UNUSUAL SHORTNESS OF BREATH *UNUSUAL BRUISING OR BLEEDING *URINARY PROBLEMS (pain or burning when urinating, or frequent urination) *BOWEL PROBLEMS (unusual diarrhea, constipation, pain near the anus) TENDERNESS IN MOUTH AND THROAT WITH OR WITHOUT PRESENCE OF ULCERS (sore throat, sores in mouth, or a toothache) UNUSUAL RASH, SWELLING OR PAIN  UNUSUAL VAGINAL DISCHARGE OR ITCHING   Items with * indicate a potential emergency and should be followed up as soon as possible or go to the Emergency Department if any problems should occur.  Please show the CHEMOTHERAPY ALERT CARD or  IMMUNOTHERAPY ALERT CARD at check-in to the Emergency Department and triage nurse.  Should you have questions after your visit or need to cancel or reschedule your appointment, please contact [place] [facility]  Dept: [phone]  and follow the prompts.  Office hours are 8:00 a.m. to 4:30 p.m. Monday - Friday. Please note that voicemails left after 4:00 p.m. may not be returned until the following business day.  We are closed weekends and major holidays. You have access to a nurse at all times for urgent questions. Please call the main number to the clinic Dept: [phone] and follow the prompts.   For any non-urgent questions, you may also contact your provider using MyChart. We now offer e-Visits for anyone 34 and older to request care online for non-urgent symptoms. For details visit [email or web address].   Also download the MyChart app! Go to the app store, search "MyChart", open the app, select , and log in with your MyChart username and password.

## 2022-11-27 ENCOUNTER — Inpatient Hospital Stay: Payer: Medicare (Managed Care)

## 2022-11-27 VITALS — BP 119/79 | HR 60 | Temp 98.5°F | Resp 17

## 2022-11-27 DIAGNOSIS — C221 Intrahepatic bile duct carcinoma: Secondary | ICD-10-CM

## 2022-11-27 DIAGNOSIS — Z5112 Encounter for antineoplastic immunotherapy: Secondary | ICD-10-CM | POA: Diagnosis not present

## 2022-11-27 MED ORDER — PEGFILGRASTIM-CBQV 6 MG/0.6ML ~~LOC~~ SOSY
6.0000 mg | PREFILLED_SYRINGE | Freq: Once | SUBCUTANEOUS | Status: AC
  Administered 2022-11-27: 6 mg via SUBCUTANEOUS
  Filled 2022-11-27: qty 0.6

## 2022-11-27 MED ORDER — HEPARIN SOD (PORK) LOCK FLUSH 100 UNIT/ML IV SOLN
500.0000 [IU] | Freq: Once | INTRAVENOUS | Status: AC | PRN
  Administered 2022-11-27: 500 [IU]

## 2022-11-27 MED ORDER — SODIUM CHLORIDE 0.9% FLUSH
10.0000 mL | INTRAVENOUS | Status: DC | PRN
  Administered 2022-11-27: 10 mL

## 2022-12-08 MED FILL — Dexamethasone Sodium Phosphate Inj 100 MG/10ML: INTRAMUSCULAR | Qty: 1 | Status: AC

## 2022-12-08 NOTE — Progress Notes (Unsigned)
Patient Care Team: Ralene Ok, MD as PCP - General (Internal Medicine) Radonna Ricker, RN (Inactive) as Oncology Nurse Navigator Malachy Mood, MD as Consulting Physician (Oncology) Jeani Hawking, MD as Consulting Physician (Gastroenterology)   CHIEF COMPLAINT: Follow up metastatic colon cancer   Oncology History  metastatic colon cancer to liver  06/20/2019 Procedure   Colonoscopy by Dr Meridee Score 06/20/19  IMPRESSION -Seven 3 to 10 mm polyps in the sigmoid colon, in the transverse colon and in the escending colon, removed with a cold snare. Resected and retrireved.  -One 20mm polyp in the descending colon. Biopsies. Tattoes.  -Mediaum sized lipoma in the ascending colon.   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 See Comment B. Colon, Ascending, polyp, Polypectomy:  -TUBULAR ADENOMA -No high grade dysplasia or malignancy.  C. Colon, TRansverse, Polyo, polectomy:  -TUBULAR ADENOMA -No high grade dysplasia or malignancy.  D. Colon, Sigmoid, Polyp, Polypectomy:  -HYPERPLASTIC POLYP   11/07/2019 Imaging   US Abdomen 11/07/19  IMPRESSION: 1. Two solid masses in the liver are nonspecific. Recommend MRI abdomen with and without contrast for further evaluation.   12/15/2019 Imaging   MRI Abdomen 12/15/19  IMPRESSION: 1. There are two large masses in the liver with appearance favoring metastatic disease or hepatocellular carcinoma or cholangiocarcinoma. A benign etiology is highly unlikely given the enhancement pattern and associated adenopathy. 2. Considerable porta hepatis and retroperitoneal adenopathy. Some of the confluent porta hepatis tumor is potentially infiltrative and abuts the pancreatic body along its right upper margin, making it difficult to completely exclude the possibility of pancreatic adenocarcinoma primary. Possibilities helpful in further workup might include tissue  diagnosis, endoscopic ultrasound, or nuclear medicine PET-CT. 3. Pancreas divisum. 4. Lumbar spondylosis and degenerative disc disease. 5. Despite efforts by the technologist and patient, motion artifact is present on today's exam and could not be eliminated. This reduces exam sensitivity and specificity.   12/22/2019 Procedure   Upper Endoscopy by Dr Elnoria Howard 12/22/19  IMPRESSION - One lymph node was visualized and measured in the porta hepatis region. Fine needle aspiration performed    12/22/2019 Initial Biopsy   A. LIVER, PORTA HEPATIS MASS, FINE NEEDLE 12/22/19 ASPIRATION:  FINAL MICROSCOPIC DIAGNOSIS:  - Malignant cells consistent with metastatic adenocarcinoma   01/01/2020 Initial Diagnosis   Intrahepatic cholangiocarcinoma (HCC)   01/08/2020 Initial Biopsy   FINAL MICROSCOPIC DIAGNOSIS:   A. LIVER, LEFT LOBE, BIOPSY:  - Metastatic adenocarcinoma, consistent with a colorectal primary.  See  comment      COMMENT:   Immunohistochemical stains show the tumor cells are positive for CK20  and CDX2 but negative for CK7, consistent with above interpretation.  Dr. Mosetta Putt was notified on 01/10/2020   01/08/2020 Genetic Testing   [name]  [name] wildtype and [name]/[name] mutations which make her eligible for target biological agent Vectibix.   01/24/2020 Procedure   PAC placed 01/24/20   01/25/2020 -  Chemotherapy   first line FOLFOX starting 01/25/2020, Vextibix  added with C2 (02/06/20)   02/06/2020 - 02/06/2022 Chemotherapy   Patient is on Treatment Plan : COLORECTAL Panitumumab q14d ([name] - Type Gene Only)     06/20/2020 Imaging   CT CAP  IMPRESSION: 1. Continued interval reduction in size and conspicuity of a subsolid nodule of the peripheral left upper lobe. 2. Unchanged prominent pretracheal and subcarinal lymph nodes. 3. Redemonstrated partially calcified low-attenuation liver masses, slightly decreased in size compared to prior examination.  4. Slight  interval decrease in size of a portacaval lymph node or conglomerate and retroperitoneal lymph nodes. 5. Findings are consistent with continued treatment response of nodal, pulmonary, and hepatic metastatic disease. 6. Coronary artery disease.   Aortic Atherosclerosis (ICD10-I70.0).     10/09/2020 Imaging   IMPRESSION: 1. Slight decrease in size of dominant liver mass with smaller liver mass within 1-2 mm of prior measurement. 2. Stable appearance of celiac lymph and retroperitoneal lymph nodes. Dominant node with calcification in the gastrohepatic ligament as described. 3. Continued decrease in size of LEFT upper lobe nodule. 4. Three-vessel coronary artery calcification. 5. Aortic atherosclerosis.   03/25/2022 -  Chemotherapy   Patient is on Treatment Plan : COLORECTAL FOLFIRI + Bevacizumab q14d     08/31/2022 Imaging    IMPRESSION: 1. Unchanged, densely calcified liver lesions, consistent with treated metastatic disease. 2. Unchanged, irregular subpleural opacity of the peripheral left upper lobe. 3. Unchanged subcentimeter gastrohepatic ligament lymph node. 4. No evidence of new metastatic disease in the chest, abdomen, or pelvis. 5. Status post hysterectomy. 6. Coronary artery disease. 7. Aortic valve calcifications. Correlate for echocardiographic evidence of aortic valve dysfunction.        CURRENT THERAPY: FOLFIRI/Beva  INTERVAL HISTORY [name] returns for follow up as scheduled. Last seen by Dr. Mosetta Putt 11/25/22 with another cycle of FOLFIRI/Beva.  ROS   Past Medical History:  Diagnosis Date   Arthritis    Diabetes mellitus without complication (HCC)    Hypertension    met colon ca to liver 01/2020     Past Surgical History:  Procedure Laterality Date   ABDOMINAL HYSTERECTOMY     ANTERIOR AND POSTERIOR REPAIR WITH SACROSPINOUS FIXATION N/A 07/16/2016   Procedure: ANTERIOR AND POSTERIOR REPAIR WITH SACROSPINOUS FIXATION;  Surgeon: Harold Hedge, MD;   Location: [facility];  Service: Gynecology;  Laterality: N/A;   CYSTOSCOPY  07/16/2016   Procedure: CYSTOSCOPY;  Surgeon: Harold Hedge, MD;  Location: [facility];  Service: Gynecology;;   ESOPHAGOGASTRODUODENOSCOPY (EGD) WITH PROPOFOL N/A 12/22/2019   Procedure: ESOPHAGOGASTRODUODENOSCOPY (EGD) WITH PROPOFOL;  Surgeon: Jeani Hawking, MD;  Location: [facility];  Service: Endoscopy;  Laterality: N/A;   FINE NEEDLE ASPIRATION N/A 12/22/2019   Procedure: FINE NEEDLE ASPIRATION (FNA) LINEAR;  Surgeon: Jeani Hawking, MD;  Location: [facility];  Service: Endoscopy;  Laterality: N/A;   IR IMAGING GUIDED PORT INSERTION  01/24/2020   LAPAROSCOPIC VAGINAL HYSTERECTOMY WITH SALPINGECTOMY Bilateral 07/16/2016   Procedure: LAPAROSCOPIC ASSISTED VAGINAL HYSTERECTOMY WITH SALPINGECTOMY;  Surgeon: Harold Hedge, MD;  Location: [facility];  Service: Gynecology;  Laterality: Bilateral;   MYOMECTOMY ABDOMINAL APPROACH     THYROID SURGERY     tyroid     UPPER ESOPHAGEAL ENDOSCOPIC ULTRASOUND (EUS) N/A 12/22/2019   Procedure: UPPER ESOPHAGEAL ENDOSCOPIC ULTRASOUND (EUS);  Surgeon: Jeani Hawking, MD;  Location: [facility];  Service: Endoscopy;  Laterality: N/A;     Outpatient Encounter Medications as of 12/09/2022  Medication Sig   aspirin EC 81 MG tablet Take 1 tablet (81 mg total) by mouth daily.   atorvastatin (LIPITOR) 20 MG tablet Take 20 mg by mouth at bedtime.    clindamycin (CLINDAGEL) 1 % gel Apply topically 2 (two) times daily.   clobetasol cream (TEMOVATE) 0.05 % Apply 1 application topically daily.   hydrochlorothiazide (MICROZIDE) 12.5 MG capsule Take 12.5 mg by mouth daily.   KLOR-CON M20 20 MEQ tablet TAKE 1 TABLET TWICE A DAY   lidocaine-prilocaine (EMLA) cream Apply 1 Application topically as needed.  magic mouthwash w/lidocaine SOLN Take 5 mLs by mouth 4 (four) times daily.   omeprazole (PRILOSEC) 20 MG capsule Take 1 capsule (20 mg total) by mouth daily. (Patient not taking: Reported on 06/03/2022)    ondansetron (ZOFRAN) 8 MG tablet Take 1 tablet (8 mg total) by mouth every 8 (eight) hours as needed.   prochlorperazine (COMPAZINE) 10 MG tablet Take 1 tablet (10 mg total) by mouth every 6 (six) hours as needed for nausea or vomiting.   spironolactone (ALDACTONE) 25 MG tablet Take 25 mg by mouth daily with breakfast.   urea (CARMOL) 10 % cream Apply topically 2 (two) times daily.   valACYclovir (VALTREX) 1000 MG tablet Take 1 tablet (1,000 mg total) by mouth 2 (two) times daily.   verapamil (CALAN) 120 MG tablet Take 120 mg by mouth 3 (three) times daily.   No facility-administered encounter medications on file as of 12/09/2022.     There were no vitals filed for this visit. There is no height or weight on file to calculate BMI.   PHYSICAL EXAM GENERAL:alert, no distress and comfortable SKIN: no rash  EYES: sclera clear NECK: without mass LYMPH:  no palpable cervical or supraclavicular lymphadenopathy  LUNGS: clear with normal breathing effort HEART: regular rate & rhythm, no lower extremity edema ABDOMEN: abdomen soft, non-tender and normal bowel sounds NEURO: alert & oriented x 3 with fluent speech, no focal motor/sensory deficits Breast exam:  PAC without erythema    CBC    Component Value Date/Time   WBC 5.6 11/25/2022 1000   WBC 5.3 03/20/2022 0748   RBC 4.07 11/25/2022 1000   HGB 11.8 (L) 11/25/2022 1000   HCT 36.8 11/25/2022 1000   PLT 244 11/25/2022 1000   MCV 90.4 11/25/2022 1000   MCH 29.0 11/25/2022 1000   MCHC 32.1 11/25/2022 1000   RDW 16.1 (H) 11/25/2022 1000   LYMPHSABS 1.1 11/25/2022 1000   MONOABS 0.6 11/25/2022 1000   EOSABS 0.1 11/25/2022 1000   BASOSABS 0.1 11/25/2022 1000     CMP     Component Value Date/Time   NA 138 11/25/2022 1000   K 4.0 11/25/2022 1000   CL 104 11/25/2022 1000   CO2 28 11/25/2022 1000   GLUCOSE 90 11/25/2022 1000   BUN 12 11/25/2022 1000   CREATININE 0.67 11/25/2022 1000   CALCIUM 9.5 11/25/2022 1000   PROT 7.6  11/25/2022 1000   ALBUMIN 4.1 11/25/2022 1000   AST 15 11/25/2022 1000   ALT 13 11/25/2022 1000   ALKPHOS 83 11/25/2022 1000   BILITOT 0.4 11/25/2022 1000   GFRNONAA >60 11/25/2022 1000   GFRAA >60 07/09/2016 0836     ASSESSMENT & PLAN:Lindsay [name] is a 67 y.o. female with    metastatic colon 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  -Liver biopsy 01/08/20 confirmed metastasis from primary colorectal cancer. PET scan showed hypermetabolism to known liver mets, diffuse thoracic and abdominal lymphadenopathy, a 1.8 cm LUL pulmonary nodule, and splenic flexure of colon. -treated with first line FOLFOX 01/25/20 - 10/02/20, Vectibix added with C2. Oxali discontinued after C18 due to reaction. -switched to Xeloda 10/21/20, dose adjusted due to significant skin toxicity -Last CT CAP 03/19/22 showed progression in liver and gastrohepatic ligament LN; she began FOLFIRI/beva on 03/25/22 -Ms. Catalfamo appears stable. S/p cycle 6 tolerating well without significant side effects. Dirrhea, intermittent pain,  and neuropathy are stable and adequately managed. She is able to recover and function well with good PS.  -I reviewed restaging CT CAP 06/16/22 which shows slight decrease in size of new liver met from 1/11, and stable disease; no new sites or progression. 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     All questions were answered. The patient knows to call the clinic with any problems, questions or concerns. No barriers to learning were detected. I  spent *** counseling the patient face to face. The total time spent in the appointment was *** and more than 50% was on counseling, review of test results, and coordination of care.   Santiago Glad, NP-C @DATE @

## 2022-12-09 ENCOUNTER — Inpatient Hospital Stay: Payer: Medicare (Managed Care) | Attending: Hematology

## 2022-12-09 ENCOUNTER — Inpatient Hospital Stay: Payer: Medicare (Managed Care)

## 2022-12-09 ENCOUNTER — Encounter: Payer: Self-pay | Admitting: Nurse Practitioner

## 2022-12-09 ENCOUNTER — Inpatient Hospital Stay (HOSPITAL_BASED_OUTPATIENT_CLINIC_OR_DEPARTMENT_OTHER): Payer: Medicare (Managed Care) | Admitting: Nurse Practitioner

## 2022-12-09 ENCOUNTER — Encounter: Payer: Self-pay | Admitting: Hematology

## 2022-12-09 VITALS — BP 128/81 | HR 65 | Temp 98.3°F | Resp 16 | Wt 184.2 lb

## 2022-12-09 VITALS — BP 134/79

## 2022-12-09 DIAGNOSIS — C221 Intrahepatic bile duct carcinoma: Secondary | ICD-10-CM

## 2022-12-09 DIAGNOSIS — G620 Drug-induced polyneuropathy: Secondary | ICD-10-CM

## 2022-12-09 DIAGNOSIS — Z90710 Acquired absence of both cervix and uterus: Secondary | ICD-10-CM | POA: Diagnosis not present

## 2022-12-09 DIAGNOSIS — C19 Malignant neoplasm of rectosigmoid junction: Secondary | ICD-10-CM | POA: Insufficient documentation

## 2022-12-09 DIAGNOSIS — R61 Generalized hyperhidrosis: Secondary | ICD-10-CM | POA: Insufficient documentation

## 2022-12-09 DIAGNOSIS — Z5189 Encounter for other specified aftercare: Secondary | ICD-10-CM | POA: Diagnosis not present

## 2022-12-09 DIAGNOSIS — C787 Secondary malignant neoplasm of liver and intrahepatic bile duct: Secondary | ICD-10-CM | POA: Insufficient documentation

## 2022-12-09 DIAGNOSIS — Z5111 Encounter for antineoplastic chemotherapy: Secondary | ICD-10-CM | POA: Diagnosis not present

## 2022-12-09 DIAGNOSIS — Z5112 Encounter for antineoplastic immunotherapy: Secondary | ICD-10-CM | POA: Diagnosis present

## 2022-12-09 DIAGNOSIS — Z79899 Other long term (current) drug therapy: Secondary | ICD-10-CM | POA: Diagnosis not present

## 2022-12-09 DIAGNOSIS — C7800 Secondary malignant neoplasm of unspecified lung: Secondary | ICD-10-CM | POA: Insufficient documentation

## 2022-12-09 DIAGNOSIS — Z95828 Presence of other vascular implants and grafts: Secondary | ICD-10-CM

## 2022-12-09 DIAGNOSIS — T451X5D Adverse effect of antineoplastic and immunosuppressive drugs, subsequent encounter: Secondary | ICD-10-CM | POA: Insufficient documentation

## 2022-12-09 DIAGNOSIS — T451X5A Adverse effect of antineoplastic and immunosuppressive drugs, initial encounter: Secondary | ICD-10-CM

## 2022-12-09 LAB — CMP (CANCER CENTER ONLY)
ALT: 13 U/L (ref 0–44)
AST: 12 U/L — ABNORMAL LOW (ref 15–41)
Albumin: 3.9 g/dL (ref 3.5–5.0)
Alkaline Phosphatase: 97 U/L (ref 38–126)
Anion gap: 6 (ref 5–15)
BUN: 10 mg/dL (ref 8–23)
CO2: 29 mmol/L (ref 22–32)
Calcium: 9.4 mg/dL (ref 8.9–10.3)
Chloride: 105 mmol/L (ref 98–111)
Creatinine: 0.66 mg/dL (ref 0.44–1.00)
GFR, Estimated: >60 mL/min (ref >60)
Glucose, Bld: 135 mg/dL — ABNORMAL HIGH (ref 70–99)
Potassium: 3.6 mmol/L (ref 3.5–5.1)
Sodium: 140 mmol/L (ref 135–145)
Total Bilirubin: 0.3 mg/dL (ref 0.3–1.2)
Total Protein: 7.2 g/dL (ref 6.5–8.1)

## 2022-12-09 LAB — CBC WITH DIFFERENTIAL (CANCER CENTER ONLY)
Abs Immature Granulocytes: 0.02 10*3/uL (ref 0.00–0.07)
Basophils Absolute: 0 10*3/uL (ref 0.0–0.1)
Basophils Relative: 0 %
Eosinophils Absolute: 0.1 10*3/uL (ref 0.0–0.5)
Eosinophils Relative: 1 %
HCT: 36.5 % (ref 36.0–46.0)
Hemoglobin: 11.9 g/dL — ABNORMAL LOW (ref 12.0–15.0)
Immature Granulocytes: 0 %
Lymphocytes Relative: 22 %
Lymphs Abs: 1.5 10*3/uL (ref 0.7–4.0)
MCH: 29.4 pg (ref 26.0–34.0)
MCHC: 32.6 g/dL (ref 30.0–36.0)
MCV: 90.1 fL (ref 80.0–100.0)
Monocytes Absolute: 0.4 10*3/uL (ref 0.1–1.0)
Monocytes Relative: 5 %
Neutro Abs: 4.8 10*3/uL (ref 1.7–7.7)
Neutrophils Relative %: 72 %
Platelet Count: 187 10*3/uL (ref 150–400)
RBC: 4.05 MIL/uL (ref 3.87–5.11)
RDW: 16.5 % — ABNORMAL HIGH (ref 11.5–15.5)
WBC Count: 6.7 10*3/uL (ref 4.0–10.5)
nRBC: 0 % (ref 0.0–0.2)

## 2022-12-09 LAB — TOTAL PROTEIN, URINE DIPSTICK: Protein, ur: NEGATIVE mg/dL

## 2022-12-09 MED ORDER — SODIUM CHLORIDE 0.9 % IV SOLN
10.0000 mg | Freq: Once | INTRAVENOUS | Status: AC
  Administered 2022-12-09: 10 mg via INTRAVENOUS
  Filled 2022-12-09: qty 10

## 2022-12-09 MED ORDER — SODIUM CHLORIDE 0.9 % IV SOLN
2400.0000 mg/m2 | INTRAVENOUS | Status: DC
  Administered 2022-12-09: 5000 mg via INTRAVENOUS
  Filled 2022-12-09: qty 100

## 2022-12-09 MED ORDER — SODIUM CHLORIDE 0.9% FLUSH
10.0000 mL | Freq: Once | INTRAVENOUS | Status: AC
  Administered 2022-12-09: 10 mL

## 2022-12-09 MED ORDER — SODIUM CHLORIDE 0.9 % IV SOLN: Freq: Once | INTRAVENOUS | Status: AC

## 2022-12-09 MED ORDER — SODIUM CHLORIDE 0.9 % IV SOLN
5.0000 mg/kg | Freq: Once | INTRAVENOUS | Status: AC
  Administered 2022-12-09: 400 mg via INTRAVENOUS
  Filled 2022-12-09: qty 16

## 2022-12-09 MED ORDER — SODIUM CHLORIDE 0.9 % IV SOLN
400.0000 mg/m2 | Freq: Once | INTRAVENOUS | Status: AC
  Administered 2022-12-09: 780 mg via INTRAVENOUS
  Filled 2022-12-09: qty 25

## 2022-12-09 MED ORDER — SODIUM CHLORIDE 0.9 % IV SOLN
180.0000 mg/m2 | Freq: Once | INTRAVENOUS | Status: AC
  Administered 2022-12-09: 360 mg via INTRAVENOUS
  Filled 2022-12-09: qty 3

## 2022-12-09 MED ORDER — SODIUM CHLORIDE 0.9% FLUSH
10.0000 mL | INTRAVENOUS | Status: DC | PRN
  Administered 2022-12-09: 10 mL

## 2022-12-09 MED ORDER — PALONOSETRON HCL INJECTION 0.25 MG/5ML
0.2500 mg | Freq: Once | INTRAVENOUS | Status: AC
  Administered 2022-12-09: 0.25 mg via INTRAVENOUS
  Filled 2022-12-09: qty 5

## 2022-12-09 MED ORDER — ATROPINE SULFATE 1 MG/ML IV SOLN
0.5000 mg | Freq: Once | INTRAVENOUS | Status: AC | PRN
  Administered 2022-12-09: 0.5 mg via INTRAVENOUS
  Filled 2022-12-09: qty 1

## 2022-12-09 NOTE — Patient Instructions (Signed)
[facility]  Discharge Instructions: Thank you for choosing [facility] to provide your oncology and hematology care.   If you have a lab appointment with the Cancer Center, please go directly to the Cancer Center and check in at the registration area.   Wear comfortable clothing and clothing appropriate for easy access to any Portacath or PICC line.   We strive to give you quality time with your provider. You may need to reschedule your appointment if you arrive late (15 or more minutes).  Arriving late affects you and other patients whose appointments are after yours.  Also, if you miss three or more appointments without notifying the office, you may be dismissed from the clinic at the provider's discretion.      For prescription refill requests, have your pharmacy contact our office and allow 72 hours for refills to be completed.    Today you received the following chemotherapy and/or immunotherapy agents : Bevacizumab, Irinotecan, Leucovorin, 5FU      To help prevent nausea and vomiting after your treatment, we encourage you to take your nausea medication as directed.  BELOW ARE SYMPTOMS THAT SHOULD BE REPORTED IMMEDIATELY: *FEVER GREATER THAN 100.4 F (38 C) OR HIGHER *CHILLS OR SWEATING *NAUSEA AND VOMITING THAT IS NOT CONTROLLED WITH YOUR NAUSEA MEDICATION *UNUSUAL SHORTNESS OF BREATH *UNUSUAL BRUISING OR BLEEDING *URINARY PROBLEMS (pain or burning when urinating, or frequent urination) *BOWEL PROBLEMS (unusual diarrhea, constipation, pain near the anus) TENDERNESS IN MOUTH AND THROAT WITH OR WITHOUT PRESENCE OF ULCERS (sore throat, sores in mouth, or a toothache) UNUSUAL RASH, SWELLING OR PAIN  UNUSUAL VAGINAL DISCHARGE OR ITCHING   Items with * indicate a potential emergency and should be followed up as soon as possible or go to the Emergency Department if any problems should occur.  Please show the CHEMOTHERAPY ALERT CARD or  IMMUNOTHERAPY ALERT CARD at check-in to the Emergency Department and triage nurse.  Should you have questions after your visit or need to cancel or reschedule your appointment, please contact [facility]  Dept: [phone]  and follow the prompts.  Office hours are 8:00 a.m. to 4:30 p.m. Monday - Friday. Please note that voicemails left after 4:00 p.m. may not be returned until the following business day.  We are closed weekends and major holidays. You have access to a nurse at all times for urgent questions. Please call the main number to the clinic Dept: [phone] and follow the prompts.   For any non-urgent questions, you may also contact your provider using MyChart. We now offer e-Visits for anyone 18 and older to request care online for non-urgent symptoms. For details visit [email or web address].   Also download the MyChart app! Go to the app store, search "MyChart", open the app, select [place], and log in with your MyChart username and password.  

## 2022-12-11 ENCOUNTER — Inpatient Hospital Stay: Payer: Medicare (Managed Care)

## 2022-12-11 ENCOUNTER — Other Ambulatory Visit: Payer: Self-pay

## 2022-12-11 VITALS — BP 108/60 | HR 71 | Temp 98.5°F | Resp 17

## 2022-12-11 DIAGNOSIS — Z95828 Presence of other vascular implants and grafts: Secondary | ICD-10-CM

## 2022-12-11 DIAGNOSIS — Z5112 Encounter for antineoplastic immunotherapy: Secondary | ICD-10-CM | POA: Diagnosis not present

## 2022-12-11 DIAGNOSIS — C221 Intrahepatic bile duct carcinoma: Secondary | ICD-10-CM

## 2022-12-11 MED ORDER — SODIUM CHLORIDE 0.9% FLUSH
10.0000 mL | Freq: Once | INTRAVENOUS | Status: AC
  Administered 2022-12-11: 10 mL

## 2022-12-11 MED ORDER — HEPARIN SOD (PORK) LOCK FLUSH 100 UNIT/ML IV SOLN
500.0000 [IU] | Freq: Once | INTRAVENOUS | Status: AC
  Administered 2022-12-11: 500 [IU]

## 2022-12-11 MED ORDER — PEGFILGRASTIM-CBQV 6 MG/0.6ML ~~LOC~~ SOSY
6.0000 mg | PREFILLED_SYRINGE | Freq: Once | SUBCUTANEOUS | Status: AC
  Administered 2022-12-11: 6 mg via SUBCUTANEOUS
  Filled 2022-12-11: qty 0.6

## 2022-12-14 ENCOUNTER — Ambulatory Visit (HOSPITAL_COMMUNITY)
Admission: RE | Admit: 2022-12-14 | Discharge: 2022-12-14 | Disposition: A | Discharge status: Home / Self Care | Payer: Medicare (Managed Care) | Source: Ambulatory Visit | Attending: Hematology | Admitting: Hematology

## 2022-12-14 ENCOUNTER — Encounter (HOSPITAL_COMMUNITY): Payer: Self-pay

## 2022-12-14 DIAGNOSIS — C221 Intrahepatic bile duct carcinoma: Secondary | ICD-10-CM | POA: Insufficient documentation

## 2022-12-14 MED ORDER — IOHEXOL 9 MG/ML PO SOLN
1000.0000 mL | ORAL | Status: AC
  Administered 2022-12-14: 1000 mL via ORAL

## 2022-12-14 MED ORDER — IOHEXOL 300 MG/ML  SOLN
100.0000 mL | Freq: Once | INTRAMUSCULAR | Status: AC | PRN
  Administered 2022-12-14: 100 mL via INTRAVENOUS

## 2022-12-14 MED ORDER — IOHEXOL 9 MG/ML PO SOLN
ORAL | Status: AC
  Filled 2022-12-14: qty 1000

## 2022-12-16 ENCOUNTER — Ambulatory Visit: Payer: Medicare (Managed Care)

## 2022-12-16 ENCOUNTER — Ambulatory Visit: Payer: Medicare (Managed Care) | Admitting: Hematology

## 2022-12-16 ENCOUNTER — Other Ambulatory Visit: Payer: Medicare (Managed Care)

## 2022-12-22 MED FILL — Dexamethasone Sodium Phosphate Inj 100 MG/10ML: INTRAMUSCULAR | Qty: 1 | Status: AC

## 2022-12-23 ENCOUNTER — Inpatient Hospital Stay: Payer: Medicare (Managed Care) | Admitting: Hematology

## 2022-12-23 ENCOUNTER — Inpatient Hospital Stay: Payer: Medicare (Managed Care)

## 2022-12-23 VITALS — BP 146/69 | HR 80 | Temp 97.9°F | Resp 18 | Ht 66.0 in | Wt 183.5 lb

## 2022-12-23 DIAGNOSIS — C221 Intrahepatic bile duct carcinoma: Secondary | ICD-10-CM

## 2022-12-23 DIAGNOSIS — Z95828 Presence of other vascular implants and grafts: Secondary | ICD-10-CM

## 2022-12-23 DIAGNOSIS — Z5112 Encounter for antineoplastic immunotherapy: Secondary | ICD-10-CM | POA: Diagnosis not present

## 2022-12-23 DIAGNOSIS — C189 Malignant neoplasm of colon, unspecified: Secondary | ICD-10-CM

## 2022-12-23 LAB — CMP (CANCER CENTER ONLY)
ALT: 13 U/L (ref 0–44)
AST: 12 U/L — ABNORMAL LOW (ref 15–41)
Albumin: 3.8 g/dL (ref 3.5–5.0)
Alkaline Phosphatase: 105 U/L (ref 38–126)
Anion gap: 8 (ref 5–15)
BUN: 10 mg/dL (ref 8–23)
CO2: 27 mmol/L (ref 22–32)
Calcium: 9.2 mg/dL (ref 8.9–10.3)
Chloride: 102 mmol/L (ref 98–111)
Creatinine: 0.71 mg/dL (ref 0.44–1.00)
GFR, Estimated: >60 mL/min (ref >60)
Glucose, Bld: 118 mg/dL — ABNORMAL HIGH (ref 70–99)
Potassium: 3.7 mmol/L (ref 3.5–5.1)
Sodium: 137 mmol/L (ref 135–145)
Total Bilirubin: 0.2 mg/dL — ABNORMAL LOW (ref 0.3–1.2)
Total Protein: 7.4 g/dL (ref 6.5–8.1)

## 2022-12-23 LAB — CBC WITH DIFFERENTIAL (CANCER CENTER ONLY)
Abs Immature Granulocytes: 0.17 10*3/uL — ABNORMAL HIGH (ref 0.00–0.07)
Basophils Absolute: 0.1 10*3/uL (ref 0.0–0.1)
Basophils Relative: 1 %
Eosinophils Absolute: 0.1 10*3/uL (ref 0.0–0.5)
Eosinophils Relative: 1 %
HCT: 37.8 % (ref 36.0–46.0)
Hemoglobin: 12 g/dL (ref 12.0–15.0)
Immature Granulocytes: 2 %
Lymphocytes Relative: 16 %
Lymphs Abs: 1.6 10*3/uL (ref 0.7–4.0)
MCH: 28.8 pg (ref 26.0–34.0)
MCHC: 31.7 g/dL (ref 30.0–36.0)
MCV: 90.6 fL (ref 80.0–100.0)
Monocytes Absolute: 0.7 10*3/uL (ref 0.1–1.0)
Monocytes Relative: 7 %
Neutro Abs: 7.3 10*3/uL (ref 1.7–7.7)
Neutrophils Relative %: 73 %
Platelet Count: 308 10*3/uL (ref 150–400)
RBC: 4.17 MIL/uL (ref 3.87–5.11)
RDW: 16.7 % — ABNORMAL HIGH (ref 11.5–15.5)
WBC Count: 9.9 10*3/uL (ref 4.0–10.5)
nRBC: 0 % (ref 0.0–0.2)

## 2022-12-23 LAB — TOTAL PROTEIN, URINE DIPSTICK: Protein, ur: NEGATIVE mg/dL

## 2022-12-23 LAB — CEA (ACCESS): CEA (CHCC): 11.04 ng/mL — ABNORMAL HIGH (ref 0.00–5.00)

## 2022-12-23 MED ORDER — ATROPINE SULFATE 1 MG/ML IV SOLN
0.5000 mg | Freq: Once | INTRAVENOUS | Status: AC | PRN
  Administered 2022-12-23: 0.5 mg via INTRAVENOUS
  Filled 2022-12-23: qty 1

## 2022-12-23 MED ORDER — PALONOSETRON HCL INJECTION 0.25 MG/5ML
0.2500 mg | Freq: Once | INTRAVENOUS | Status: AC
  Administered 2022-12-23: 0.25 mg via INTRAVENOUS

## 2022-12-23 MED ORDER — SODIUM CHLORIDE 0.9% FLUSH: 10.0000 mL | INTRAVENOUS | Status: DC | PRN

## 2022-12-23 MED ORDER — SODIUM CHLORIDE 0.9 % IV SOLN
2400.0000 mg/m2 | INTRAVENOUS | Status: DC
  Administered 2022-12-23: 5000 mg via INTRAVENOUS
  Filled 2022-12-23: qty 100

## 2022-12-23 MED ORDER — SODIUM CHLORIDE 0.9 % IV SOLN: Freq: Once | INTRAVENOUS | Status: AC

## 2022-12-23 MED ORDER — SODIUM CHLORIDE 0.9% FLUSH
10.0000 mL | Freq: Once | INTRAVENOUS | Status: AC
  Administered 2022-12-23: 10 mL

## 2022-12-23 MED ORDER — SODIUM CHLORIDE 0.9 % IV SOLN
400.0000 mg/m2 | Freq: Once | INTRAVENOUS | Status: AC
  Administered 2022-12-23: 780 mg via INTRAVENOUS
  Filled 2022-12-23: qty 25

## 2022-12-23 MED ORDER — SODIUM CHLORIDE 0.9 % IV SOLN
10.0000 mg | Freq: Once | INTRAVENOUS | Status: AC
  Administered 2022-12-23: 10 mg via INTRAVENOUS
  Filled 2022-12-23: qty 10

## 2022-12-23 MED ORDER — SODIUM CHLORIDE 0.9 % IV SOLN
180.0000 mg/m2 | Freq: Once | INTRAVENOUS | Status: AC
  Administered 2022-12-23: 360 mg via INTRAVENOUS
  Filled 2022-12-23: qty 15

## 2022-12-23 MED ORDER — HEPARIN SOD (PORK) LOCK FLUSH 100 UNIT/ML IV SOLN: 500.0000 [IU] | Freq: Once | INTRAVENOUS | Status: DC | PRN

## 2022-12-23 MED ORDER — SODIUM CHLORIDE 0.9 % IV SOLN
5.0000 mg/kg | Freq: Once | INTRAVENOUS | Status: AC
  Administered 2022-12-23: 400 mg via INTRAVENOUS
  Filled 2022-12-23: qty 16

## 2022-12-23 NOTE — Assessment & Plan Note (Signed)
MSS, [name]/[name] wildtype -diagnosed 12/2019 by porta hepatis LN biopsy during EUS for work up of abdominal pain and liver lesions seen on [place] and MRI. Liver biopsy 01/08/20 confirmed metastasis from primary colorectal cancer. PET scan showed hypermetabolism to known liver mets, diffuse thoracic and abdominal lymphadenopathy, a 1.8 cm LUL pulmonary nodule, and splenic flexure of colon. -treated with first line FOLFOX 01/25/20 - 10/02/20, Vectibix added with C2. Oxali discontinued after C18 due to reaction. -switched to Xeloda 10/21/20, dose adjusted due to significant skin toxicity -due to cancer progression on recent staging scan, treatment has been changed to FOLFIRI and bevacizumab on 03/25/22, she is overall tolerating well -will repeat CT scan after next cycle chemo  -Restaging CT scan from 06/16/2022 showed stable to mild decrease in size of treated liver metastasis. No new lesions -She is tolerating chemotherapy well overall, will continue -Her restaging CT scan on August 31, 2022 showed stable liver metastasis, and a stable 7 mm left lung nodule, indeterminate.  I personally reviewed the scan images with patient -Will continue current therapy.  We discussed the option of maintenance therapy with irinotecan and bevacizumab in future.  She is tolerating current FOLFIRI and bevacizumab well, will continue. -Restaging CT 12/17/22 showed stable disease

## 2022-12-23 NOTE — Progress Notes (Signed)
Ok to proceed with premeds prior to the CMP being resulted Per Dr. Mosetta Putt.

## 2022-12-23 NOTE — Progress Notes (Signed)
[facility]   Telephone:([phone]) [phone] Fax:(336) [phone]   Clinic Follow up Note   Patient Care Team: Ralene Ok, MD as PCP - General (Internal Medicine) Radonna Ricker, RN (Inactive) as Oncology Nurse Navigator Malachy Mood, MD as Consulting Physician (Oncology) Jeani Hawking, MD as Consulting Physician (Gastroenterology)  Date of Service:  12/23/2022  CHIEF COMPLAINT: f/u of metastatic colon cancer  CURRENT THERAPY:  Second line chemotherapy FOLFIRI and bevacizumab  Oncology History   metastatic colon 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 Liver biopsy 01/08/20 confirmed metastasis from primary colorectal cancer. PET scan showed hypermetabolism to known liver mets, diffuse thoracic and abdominal lymphadenopathy, a 1.8 cm LUL pulmonary nodule, and splenic flexure of colon. -treated with first line FOLFOX 01/25/20 - 10/02/20, Vectibix added with C2. Oxali discontinued after C18 due to reaction. -switched to Xeloda 10/21/20, dose adjusted due to significant skin toxicity -due to cancer progression on recent staging scan, treatment has been changed to FOLFIRI and bevacizumab on 03/25/22, she is overall tolerating well -will repeat CT scan after next cycle chemo  -Restaging CT scan from 06/16/2022 showed stable to mild decrease in size of treated liver metastasis. No new lesions -She is tolerating chemotherapy well overall, will continue -Her restaging CT scan on August 31, 2022 showed stable liver metastasis, and a stable 7 mm left lung nodule, indeterminate.  I personally reviewed the scan images with patient -Will continue current therapy.  We discussed the option of maintenance therapy with irinotecan and bevacizumab in future.  She is tolerating current FOLFIRI and bevacizumab well, will continue. -Restaging CT 12/17/22 showed stable disease     Assessment and Plan    Metastatic Colon Cancer Stable disease on recent CT scan. Noted intermittent rectal bleeding, possibly due to hemorrhoids or primary tumor. No pain or difficulty with bowel movements. -Continue current treatment regimen. -Repeat tumor marker today. -Consider colonoscopy if bleeding increases. -Next appointments on 01/06/2023, 01/20/2023, and 02/03/2023 postponed due to Thanksgiving.  Constipation Managed with Magnesia as needed. Miralax was ineffective. -Continue current management.  Hypertension Elevated blood pressure today, possibly due to bevacizumab. No symptoms of chest discomfort or headaches. -Check blood pressure again today. -Adjust blood pressure medication if consistently high.  Peripheral Neuropathy Numbness, possibly due to previous chemotherapy. No difficulty using hands. -Resume Vitamin B12 to help manage neuropathy. -Encouraged to exercise and keep hands warm.  Plan -Restaging CT scan reviewed, stable disease.   -Lab reviewed CBC is normal, CMP still pending, if adequate, will proceed chemo today and continue every 2 weeks -Follow-up in 2 weeks     SUMMARY OF ONCOLOGIC HISTORY: Oncology History  metastatic colon cancer to liver  06/20/2019 Procedure   Colonoscopy by Dr Meridee Score 06/20/19  IMPRESSION -Seven 3 to 10 mm polyps in the sigmoid colon, in the transverse colon and in the escending colon, removed with a cold snare. Resected and retrireved.  -One 20mm polyp in the descending colon. Biopsies. Tattoes.  -Mediaum sized lipoma in the ascending colon.   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 See Comment B. Colon, Ascending, polyp, Polypectomy:  -TUBULAR ADENOMA -No high grade dysplasia or malignancy.  C. Colon, TRansverse, Polyo, polectomy:  -TUBULAR ADENOMA -No high grade dysplasia or malignancy.  D. Colon, Sigmoid, Polyp, Polypectomy:   -HYPERPLASTIC POLYP   11/07/2019 Imaging  US Abdomen 11/07/19  IMPRESSION: 1. Two solid masses in the liver are nonspecific. Recommend MRI abdomen with and without contrast for further evaluation.   12/15/2019 Imaging   MRI Abdomen 12/15/19  IMPRESSION: 1. There are two large masses in the liver with appearance favoring metastatic disease or hepatocellular carcinoma or cholangiocarcinoma. A benign etiology is highly unlikely given the enhancement pattern and associated adenopathy. 2. Considerable porta hepatis and retroperitoneal adenopathy. Some of the confluent porta hepatis tumor is potentially infiltrative and abuts the pancreatic body along its right upper margin, making it difficult to completely exclude the possibility of pancreatic adenocarcinoma primary. Possibilities helpful in further workup might include tissue diagnosis, endoscopic ultrasound, or nuclear medicine PET-CT. 3. Pancreas divisum. 4. Lumbar spondylosis and degenerative disc disease. 5. Despite efforts by the technologist and patient, motion artifact is present on today's exam and could not be eliminated. This reduces exam sensitivity and specificity.   12/22/2019 Procedure   Upper Endoscopy by Dr Elnoria Howard 12/22/19  IMPRESSION - One lymph node was visualized and measured in the porta hepatis region. Fine needle aspiration performed    12/22/2019 Initial Biopsy   A. LIVER, PORTA HEPATIS MASS, FINE NEEDLE 12/22/19 ASPIRATION:  FINAL MICROSCOPIC DIAGNOSIS:  - Malignant cells consistent with metastatic adenocarcinoma   01/01/2020 Initial Diagnosis   Intrahepatic cholangiocarcinoma (HCC)   01/08/2020 Initial Biopsy   FINAL MICROSCOPIC DIAGNOSIS:   A. LIVER, LEFT LOBE, BIOPSY:  - Metastatic adenocarcinoma, consistent with a colorectal primary.  See  comment      COMMENT:   Immunohistochemical stains show the tumor cells are positive for CK20  and CDX2 but negative for CK7, consistent with above  interpretation.  Dr. Mosetta Putt was notified on 01/10/2020   01/08/2020 Genetic Testing   [name]  [name] wildtype and [name]/[name] mutations which make her eligible for target biological agent Vectibix.   01/24/2020 Procedure   PAC placed 01/24/20   01/25/2020 -  Chemotherapy   first line FOLFOX starting 01/25/2020, Vextibix  added with C2 (02/06/20)   02/06/2020 - 02/06/2022 Chemotherapy   Patient is on Treatment Plan : COLORECTAL Panitumumab q14d ([name] - Type Gene Only)     06/20/2020 Imaging   CT CAP  IMPRESSION: 1. Continued interval reduction in size and conspicuity of a subsolid nodule of the peripheral left upper lobe. 2. Unchanged prominent pretracheal and subcarinal lymph nodes. 3. Redemonstrated partially calcified low-attenuation liver masses, slightly decreased in size compared to prior examination. 4. Slight interval decrease in size of a portacaval lymph node or conglomerate and retroperitoneal lymph nodes. 5. Findings are consistent with continued treatment response of nodal, pulmonary, and hepatic metastatic disease. 6. Coronary artery disease.   Aortic Atherosclerosis (ICD10-I70.0).     10/09/2020 Imaging   IMPRESSION: 1. Slight decrease in size of dominant liver mass with smaller liver mass within 1-2 mm of prior measurement. 2. Stable appearance of celiac lymph and retroperitoneal lymph nodes. Dominant node with calcification in the gastrohepatic ligament as described. 3. Continued decrease in size of LEFT upper lobe nodule. 4. Three-vessel coronary artery calcification. 5. Aortic atherosclerosis.   03/25/2022 -  Chemotherapy   Patient is on Treatment Plan : COLORECTAL FOLFIRI + Bevacizumab q14d     08/31/2022 Imaging    IMPRESSION: 1. Unchanged, densely calcified liver lesions, consistent with treated metastatic disease. 2. Unchanged, irregular subpleural opacity of the peripheral left upper lobe. 3. Unchanged subcentimeter gastrohepatic ligament  lymph node. 4. No evidence of new metastatic disease in the chest, abdomen, or  pelvis. 5. Status post hysterectomy. 6. Coronary artery disease. 7. Aortic valve calcifications. Correlate for echocardiographic evidence of aortic valve dysfunction.        Discussed the use of AI scribe software for clinical note transcription with the patient, who gave verbal consent to proceed.  History of Present Illness   A 67 year old patient with a history of metastatic colon cancer presents for a routine follow-up. The patient reports occasional rectal bleeding, which she attributes to hemorrhoids. The bleeding is not consistent and occurs about half the time during bowel movements. The patient also experiences occasional constipation, which she manages with Magnesia. The patient denies any pain or difficulty with bowel movements. The patient's blood pressure was noted to be high during the visit, which she attributes to gas discomfort. The patient is currently on a regimen of bevacizumab, a biological agent, which can cause high blood pressure.         All other systems were reviewed with the patient and are negative.  MEDICAL HISTORY:  Past Medical History:  Diagnosis Date   Arthritis    Diabetes mellitus without complication (HCC)    Hypertension    met colon ca to liver 01/2020    SURGICAL HISTORY: Past Surgical History:  Procedure Laterality Date   ABDOMINAL HYSTERECTOMY     ANTERIOR AND POSTERIOR REPAIR WITH SACROSPINOUS FIXATION N/A 07/16/2016   Procedure: ANTERIOR AND POSTERIOR REPAIR WITH SACROSPINOUS FIXATION;  Surgeon: Harold Hedge, MD;  Location: [facility];  Service: Gynecology;  Laterality: N/A;   CYSTOSCOPY  07/16/2016   Procedure: CYSTOSCOPY;  Surgeon: Harold Hedge, MD;  Location: [facility];  Service: Gynecology;;   ESOPHAGOGASTRODUODENOSCOPY (EGD) WITH PROPOFOL N/A 12/22/2019   Procedure: ESOPHAGOGASTRODUODENOSCOPY (EGD) WITH PROPOFOL;  Surgeon: Jeani Hawking, MD;  Location: [facility]  ENDOSCOPY;  Service: Endoscopy;  Laterality: N/A;   FINE NEEDLE ASPIRATION N/A 12/22/2019   Procedure: FINE NEEDLE ASPIRATION (FNA) LINEAR;  Surgeon: Jeani Hawking, MD;  Location: [facility];  Service: Endoscopy;  Laterality: N/A;   IR IMAGING GUIDED PORT INSERTION  01/24/2020   LAPAROSCOPIC VAGINAL HYSTERECTOMY WITH SALPINGECTOMY Bilateral 07/16/2016   Procedure: LAPAROSCOPIC ASSISTED VAGINAL HYSTERECTOMY WITH SALPINGECTOMY;  Surgeon: Harold Hedge, MD;  Location: [facility];  Service: Gynecology;  Laterality: Bilateral;   MYOMECTOMY ABDOMINAL APPROACH     THYROID SURGERY     tyroid     UPPER ESOPHAGEAL ENDOSCOPIC ULTRASOUND (EUS) N/A 12/22/2019   Procedure: UPPER ESOPHAGEAL ENDOSCOPIC ULTRASOUND (EUS);  Surgeon: Jeani Hawking, MD;  Location: [facility];  Service: Endoscopy;  Laterality: N/A;    I have reviewed the social history and family history with the patient and they are unchanged from previous note.  ALLERGIES:  is allergic to oxaliplatin.  MEDICATIONS:  Current Outpatient Medications  Medication Sig Dispense Refill   aspirin EC 81 MG tablet Take 1 tablet (81 mg total) by mouth daily. (Patient not taking: Reported on 12/09/2022) 90 tablet 3   atorvastatin (LIPITOR) 20 MG tablet Take 20 mg by mouth at bedtime.   1   clindamycin (CLINDAGEL) 1 % gel Apply topically 2 (two) times daily. (Patient not taking: Reported on 12/09/2022) 60 g 2   clobetasol cream (TEMOVATE) 0.05 % Apply 1 application topically daily. (Patient not taking: Reported on 12/09/2022)     hydrochlorothiazide (MICROZIDE) 12.5 MG capsule Take 12.5 mg by mouth daily.     KLOR-CON M20 20 MEQ tablet TAKE 1 TABLET TWICE A DAY 180 tablet 3   lidocaine-prilocaine (EMLA) cream Apply 1 Application topically as needed. 30  g 1   magic mouthwash w/lidocaine SOLN Take 5 mLs by mouth 4 (four) times daily. 475 mL 2   omeprazole (PRILOSEC) 20 MG capsule Take 1 capsule (20 mg total) by mouth daily. (Patient not taking: Reported on  06/03/2022) 30 capsule 2   ondansetron (ZOFRAN) 8 MG tablet Take 1 tablet (8 mg total) by mouth every 8 (eight) hours as needed. 20 tablet 0   prochlorperazine (COMPAZINE) 10 MG tablet Take 1 tablet (10 mg total) by mouth every 6 (six) hours as needed for nausea or vomiting. 30 tablet 2   spironolactone (ALDACTONE) 25 MG tablet Take 25 mg by mouth daily with breakfast.     urea (CARMOL) 10 % cream Apply topically 2 (two) times daily. (Patient not taking: Reported on 12/09/2022) 71 g 2   valACYclovir (VALTREX) 1000 MG tablet Take 1 tablet (1,000 mg total) by mouth 2 (two) times daily. (Patient not taking: Reported on 12/09/2022) 10 tablet 0   verapamil (CALAN) 120 MG tablet Take 120 mg by mouth 3 (three) times daily. (Patient not taking: Reported on 12/09/2022)     No current facility-administered medications for this visit.    PHYSICAL EXAMINATION: ECOG PERFORMANCE STATUS: 1 - Symptomatic but completely ambulatory  Vitals:   12/23/22 0949 12/23/22 0950  BP: (!) 169/86 (!) 146/69  Pulse: 80   Resp: 18   Temp: 97.9 F (36.6 C)   SpO2: 100%    Wt Readings from Last 3 Encounters:  12/23/22 183 lb 8 oz (83.2 kg)  12/09/22 184 lb 3.2 oz (83.6 kg)  11/25/22 181 lb 4.8 oz (82.2 kg)     GENERAL:alert, no distress and comfortable SKIN: skin color, texture, turgor are normal, no rashes or significant lesions EYES: normal, Conjunctiva are pink and non-injected, sclera clear NECK: supple, thyroid normal size, non-tender, without nodularity LYMPH:  no palpable lymphadenopathy in the cervical, axillary  LUNGS: clear to auscultation and percussion with normal breathing effort HEART: regular rate & rhythm and no murmurs and no lower extremity edema ABDOMEN:abdomen soft, non-tender and normal bowel sounds Musculoskeletal:no cyanosis of digits and no clubbing  NEURO: alert & oriented x 3 with fluent speech, no focal motor/sensory deficits    LABORATORY DATA:  I have reviewed the data as  listed    Latest Ref Rng & Units 12/23/2022    9:26 AM 12/09/2022    9:55 AM 11/25/2022   10:00 AM  CBC  WBC 4.0 - 10.5 K/uL 9.9  6.7  5.6   Hemoglobin 12.0 - 15.0 g/dL 16.1  09.6  04.5   Hematocrit 36.0 - 46.0 % 37.8  36.5  36.8   Platelets 150 - 400 K/uL 308  187  244         Latest Ref Rng & Units 12/09/2022    9:55 AM 11/25/2022   10:00 AM 10/21/2022    8:55 AM  CMP  Glucose 70 - 99 mg/dL 409  90  811   BUN 8 - 23 mg/dL 10  12  9    Creatinine 0.44 - 1.00 mg/dL 9.14  7.82  9.56   Sodium 135 - 145 mmol/L 140  138  139   Potassium 3.5 - 5.1 mmol/L 3.6  4.0  3.8   Chloride 98 - 111 mmol/L 105  104  104   CO2 22 - 32 mmol/L 29  28  29    Calcium 8.9 - 10.3 mg/dL 9.4  9.5  9.4   Total Protein 6.5 - 8.1 g/dL 7.2  7.6  7.3   Total Bilirubin 0.3 - 1.2 mg/dL 0.3  0.4  0.2   Alkaline Phos 38 - 126 U/L 97  83  111   AST 15 - 41 U/L 12  15  12    ALT 0 - 44 U/L 13  13  12        RADIOGRAPHIC STUDIES: I have personally reviewed the radiological images as listed and agreed with the findings in the report. No results found.    Orders Placed This Encounter  Procedures   CBC with Differential (Cancer Center Only)    Standing Status:   Future    Standing Expiration Date:   01/20/2024   CMP (Cancer Center only)    Standing Status:   Future    Standing Expiration Date:   01/20/2024   Total Protein, Urine dipstick    Standing Status:   Future    Standing Expiration Date:   01/20/2024   CBC with Differential (Cancer Center Only)    Standing Status:   Future    Standing Expiration Date:   02/10/2024   CMP (Cancer Center only)    Standing Status:   Future    Standing Expiration Date:   02/10/2024   Total Protein, Urine dipstick    Standing Status:   Future    Standing Expiration Date:   02/10/2024   CBC with Differential (Cancer Center Only)    Standing Status:   Future    Standing Expiration Date:   02/24/2024   CMP (Cancer Center only)    Standing Status:   Future    Standing  Expiration Date:   02/24/2024   Total Protein, Urine dipstick    Standing Status:   Future    Standing Expiration Date:   02/24/2024   All questions were answered. The patient knows to call the clinic with any problems, questions or concerns. No barriers to learning was detected. The total time spent in the appointment was 25 minutes.     Malachy Mood, MD 12/23/2022

## 2022-12-23 NOTE — Patient Instructions (Signed)
[place] [facility]  Discharge Instructions: Thank you for choosing [facility] to provide your oncology and hematology care.   If you have a lab appointment with the Cancer Center, please go directly to the Cancer Center and check in at the registration area.   Wear comfortable clothing and clothing appropriate for easy access to any Portacath or PICC line.   We strive to give you quality time with your provider. You may need to reschedule your appointment if you arrive late (15 or more minutes).  Arriving late affects you and other patients whose appointments are after yours.  Also, if you miss three or more appointments without notifying the office, you may be dismissed from the clinic at the provider's discretion.      For prescription refill requests, have your pharmacy contact our office and allow 72 hours for refills to be completed.    Today you received the following chemotherapy and/or immunotherapy agents : Bevacizumab, Irinotecan, Leucovorin, 5FU      To help prevent nausea and vomiting after your treatment, we encourage you to take your nausea medication as directed.  BELOW ARE SYMPTOMS THAT SHOULD BE REPORTED IMMEDIATELY: *FEVER GREATER THAN 100.4 F (38 C) OR HIGHER *CHILLS OR SWEATING *NAUSEA AND VOMITING THAT IS NOT CONTROLLED WITH YOUR NAUSEA MEDICATION *UNUSUAL SHORTNESS OF BREATH *UNUSUAL BRUISING OR BLEEDING *URINARY PROBLEMS (pain or burning when urinating, or frequent urination) *BOWEL PROBLEMS (unusual diarrhea, constipation, pain near the anus) TENDERNESS IN MOUTH AND THROAT WITH OR WITHOUT PRESENCE OF ULCERS (sore throat, sores in mouth, or a toothache) UNUSUAL RASH, SWELLING OR PAIN  UNUSUAL VAGINAL DISCHARGE OR ITCHING   Items with * indicate a potential emergency and should be followed up as soon as possible or go to the Emergency Department if any problems should occur.  Please show the CHEMOTHERAPY ALERT CARD or  IMMUNOTHERAPY ALERT CARD at check-in to the Emergency Department and triage nurse.  Should you have questions after your visit or need to cancel or reschedule your appointment, please contact [place] [facility]  Dept: [phone]  and follow the prompts.  Office hours are 8:00 a.m. to 4:30 p.m. Monday - Friday. Please note that voicemails left after 4:00 p.m. may not be returned until the following business day.  We are closed weekends and major holidays. You have access to a nurse at all times for urgent questions. Please call the main number to the clinic Dept: [phone] and follow the prompts.   For any non-urgent questions, you may also contact your provider using MyChart. We now offer e-Visits for anyone 52 and older to request care online for non-urgent symptoms. For details visit [email or web address].   Also download the MyChart app! Go to the app store, search "MyChart", open the app, select [place], and log in with your MyChart username and password.

## 2022-12-25 ENCOUNTER — Encounter: Payer: Self-pay | Admitting: Hematology

## 2022-12-25 ENCOUNTER — Inpatient Hospital Stay: Payer: Medicare (Managed Care)

## 2022-12-25 DIAGNOSIS — C221 Intrahepatic bile duct carcinoma: Secondary | ICD-10-CM

## 2022-12-25 DIAGNOSIS — Z5112 Encounter for antineoplastic immunotherapy: Secondary | ICD-10-CM | POA: Diagnosis not present

## 2022-12-25 MED ORDER — SODIUM CHLORIDE 0.9% FLUSH
10.0000 mL | INTRAVENOUS | Status: DC | PRN
  Administered 2022-12-25: 10 mL

## 2022-12-25 MED ORDER — HEPARIN SOD (PORK) LOCK FLUSH 100 UNIT/ML IV SOLN
500.0000 [IU] | Freq: Once | INTRAVENOUS | Status: AC | PRN
  Administered 2022-12-25: 500 [IU]

## 2022-12-25 MED ORDER — PEGFILGRASTIM-CBQV 6 MG/0.6ML ~~LOC~~ SOSY
6.0000 mg | PREFILLED_SYRINGE | Freq: Once | SUBCUTANEOUS | Status: AC
  Administered 2022-12-25: 6 mg via SUBCUTANEOUS
  Filled 2022-12-25: qty 0.6

## 2022-12-25 NOTE — Telephone Encounter (Signed)
TC

## 2022-12-26 ENCOUNTER — Other Ambulatory Visit: Payer: Self-pay

## 2023-01-01 NOTE — Progress Notes (Signed)
Due to Udenyca being on backorder, this patient's order has been changed to Neulasta per insurance.

## 2023-01-05 NOTE — Assessment & Plan Note (Signed)
MSS, [name]/[name] wildtype -diagnosed 12/2019 by porta hepatis LN biopsy during EUS for work up of abdominal pain and liver lesions seen on [place] and MRI. Liver biopsy 01/08/20 confirmed metastasis from primary colorectal cancer. PET scan showed hypermetabolism to known liver mets, diffuse thoracic and abdominal lymphadenopathy, a 1.8 cm LUL pulmonary nodule, and splenic flexure of colon. -treated with first line FOLFOX 01/25/20 - 10/02/20, Vectibix added with C2. Oxali discontinued after C18 due to reaction. -switched to Xeloda 10/21/20, dose adjusted due to significant skin toxicity -due to cancer progression on recent staging scan, treatment has been changed to FOLFIRI and bevacizumab on 03/25/22, she is overall tolerating well -will repeat CT scan after next cycle chemo  -Restaging CT scan from 06/16/2022 showed stable to mild decrease in size of treated liver metastasis. No new lesions -She is tolerating chemotherapy well overall, will continue -Her restaging CT scan on August 31, 2022 showed stable liver metastasis, and a stable 7 mm left lung nodule, indeterminate.  I personally reviewed the scan images with patient -Will continue current therapy.  We discussed the option of maintenance therapy with irinotecan and bevacizumab in future.  She is tolerating current FOLFIRI and bevacizumab well, will continue. -Restaging CT 12/17/22 showed stable disease, will continue chemo

## 2023-01-06 ENCOUNTER — Inpatient Hospital Stay: Payer: Medicare (Managed Care)

## 2023-01-06 ENCOUNTER — Encounter: Payer: Self-pay | Admitting: Hematology

## 2023-01-06 ENCOUNTER — Inpatient Hospital Stay: Payer: Medicare (Managed Care) | Admitting: Hematology

## 2023-01-06 VITALS — BP 128/90 | HR 69 | Temp 97.9°F | Resp 18 | Ht 66.0 in | Wt 183.0 lb

## 2023-01-06 DIAGNOSIS — C221 Intrahepatic bile duct carcinoma: Secondary | ICD-10-CM | POA: Diagnosis not present

## 2023-01-06 DIAGNOSIS — Z95828 Presence of other vascular implants and grafts: Secondary | ICD-10-CM

## 2023-01-06 DIAGNOSIS — Z5112 Encounter for antineoplastic immunotherapy: Secondary | ICD-10-CM | POA: Diagnosis not present

## 2023-01-06 DIAGNOSIS — C189 Malignant neoplasm of colon, unspecified: Secondary | ICD-10-CM

## 2023-01-06 LAB — CBC WITH DIFFERENTIAL (CANCER CENTER ONLY)
Abs Immature Granulocytes: 0.09 K/uL — ABNORMAL HIGH (ref 0.00–0.07)
Basophils Absolute: 0.1 K/uL (ref 0.0–0.1)
Basophils Relative: 0 %
Eosinophils Absolute: 0.1 K/uL (ref 0.0–0.5)
Eosinophils Relative: 1 %
HCT: 37.1 % (ref 36.0–46.0)
Hemoglobin: 11.7 g/dL — ABNORMAL LOW (ref 12.0–15.0)
Immature Granulocytes: 1 %
Lymphocytes Relative: 17 %
Lymphs Abs: 2 K/uL (ref 0.7–4.0)
MCH: 28.5 pg (ref 26.0–34.0)
MCHC: 31.5 g/dL (ref 30.0–36.0)
MCV: 90.3 fL (ref 80.0–100.0)
Monocytes Absolute: 0.8 K/uL (ref 0.1–1.0)
Monocytes Relative: 7 %
Neutro Abs: 8.5 K/uL — ABNORMAL HIGH (ref 1.7–7.7)
Neutrophils Relative %: 74 %
Platelet Count: 292 K/uL (ref 150–400)
RBC: 4.11 MIL/uL (ref 3.87–5.11)
RDW: 16.5 % — ABNORMAL HIGH (ref 11.5–15.5)
WBC Count: 11.6 K/uL — ABNORMAL HIGH (ref 4.0–10.5)
nRBC: 0 % (ref 0.0–0.2)

## 2023-01-06 LAB — CMP (CANCER CENTER ONLY)
ALT: 12 U/L (ref 0–44)
AST: 12 U/L — ABNORMAL LOW (ref 15–41)
Albumin: 4 g/dL (ref 3.5–5.0)
Alkaline Phosphatase: 113 U/L (ref 38–126)
Anion gap: 7 (ref 5–15)
BUN: 14 mg/dL (ref 8–23)
CO2: 27 mmol/L (ref 22–32)
Calcium: 9.3 mg/dL (ref 8.9–10.3)
Chloride: 105 mmol/L (ref 98–111)
Creatinine: 0.72 mg/dL (ref 0.44–1.00)
GFR, Estimated: >60 mL/min (ref >60)
Glucose, Bld: 107 mg/dL — ABNORMAL HIGH (ref 70–99)
Potassium: 3.8 mmol/L (ref 3.5–5.1)
Sodium: 139 mmol/L (ref 135–145)
Total Bilirubin: 0.2 mg/dL — ABNORMAL LOW (ref 0.3–1.2)
Total Protein: 7.3 g/dL (ref 6.5–8.1)

## 2023-01-06 LAB — TOTAL PROTEIN, URINE DIPSTICK: Protein, ur: NEGATIVE mg/dL

## 2023-01-06 LAB — CEA (ACCESS): CEA (CHCC): 11.72 ng/mL — ABNORMAL HIGH (ref 0.00–5.00)

## 2023-01-06 MED ORDER — SODIUM CHLORIDE 0.9% FLUSH
10.0000 mL | Freq: Once | INTRAVENOUS | Status: AC
Start: 2023-01-06 — End: 2023-01-06
  Administered 2023-01-06: 10 mL

## 2023-01-06 MED ORDER — SODIUM CHLORIDE 0.9 % IV SOLN: Freq: Once | INTRAVENOUS | Status: AC

## 2023-01-06 MED ORDER — SODIUM CHLORIDE 0.9 % IV SOLN
400.0000 mg/m2 | Freq: Once | INTRAVENOUS | Status: AC
  Administered 2023-01-06: 780 mg via INTRAVENOUS
  Filled 2023-01-06: qty 25

## 2023-01-06 MED ORDER — SODIUM CHLORIDE 0.9 % IV SOLN
2400.0000 mg/m2 | INTRAVENOUS | Status: DC
  Administered 2023-01-06: 5000 mg via INTRAVENOUS
  Filled 2023-01-06: qty 100

## 2023-01-06 MED ORDER — HEPARIN SOD (PORK) LOCK FLUSH 100 UNIT/ML IV SOLN
500.0000 [IU] | Freq: Once | INTRAVENOUS | Status: DC | PRN
Start: 2023-01-06 — End: 2023-01-06

## 2023-01-06 MED ORDER — SODIUM CHLORIDE 0.9% FLUSH: 10.0000 mL | INTRAVENOUS | Status: DC | PRN

## 2023-01-06 MED ORDER — PALONOSETRON HCL INJECTION 0.25 MG/5ML
0.2500 mg | Freq: Once | INTRAVENOUS | Status: AC
Start: 2023-01-06 — End: 2023-01-06
  Administered 2023-01-06: 0.25 mg via INTRAVENOUS
  Filled 2023-01-06: qty 5

## 2023-01-06 MED ORDER — SODIUM CHLORIDE 0.9 % IV SOLN
180.0000 mg/m2 | Freq: Once | INTRAVENOUS | Status: AC
  Administered 2023-01-06: 360 mg via INTRAVENOUS
  Filled 2023-01-06: qty 15

## 2023-01-06 MED ORDER — BEVACIZUMAB-AWWB CHEMO INJECTION 400 MG/16ML
5.0000 mg/kg | Freq: Once | INTRAVENOUS | Status: AC
  Administered 2023-01-06: 400 mg via INTRAVENOUS
  Filled 2023-01-06: qty 16

## 2023-01-06 MED ORDER — ATROPINE SULFATE 1 MG/ML IV SOLN
0.5000 mg | Freq: Once | INTRAVENOUS | Status: AC | PRN
  Administered 2023-01-06: 0.5 mg via INTRAVENOUS
  Filled 2023-01-06: qty 1

## 2023-01-06 MED ORDER — DEXAMETHASONE SODIUM PHOSPHATE 10 MG/ML IJ SOLN
10.0000 mg | Freq: Once | INTRAMUSCULAR | Status: AC
  Administered 2023-01-06: 10 mg via INTRAVENOUS
  Filled 2023-01-06: qty 1

## 2023-01-06 NOTE — Progress Notes (Signed)
[facility]   Telephone:([phone] [phone]   Clinic Follow up Note   Patient Care Team: Ralene Ok, MD as PCP - General (Internal Medicine) Radonna Ricker, RN (Inactive) as Oncology Nurse Navigator Malachy Mood, MD as Consulting Physician (Oncology) Jeani Hawking, MD as Consulting Physician (Gastroenterology)  Date of Service:  01/06/2023  CHIEF COMPLAINT: f/u of metastatic colon cancer  CURRENT THERAPY:  FOLFIRI and bevacizumab  Oncology History   metastatic colon 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 Liver biopsy 01/08/20 confirmed metastasis from primary colorectal cancer. PET scan showed hypermetabolism to known liver mets, diffuse thoracic and abdominal lymphadenopathy, a 1.8 cm LUL pulmonary nodule, and splenic flexure of colon. -treated with first line FOLFOX 01/25/20 - 10/02/20, Vectibix added with C2. Oxali discontinued after C18 due to reaction. -switched to Xeloda 10/21/20, dose adjusted due to significant skin toxicity -due to cancer progression on recent staging scan, treatment has been changed to FOLFIRI and bevacizumab on 03/25/22, she is overall tolerating well -will repeat CT scan after next cycle chemo  -Restaging CT scan from 06/16/2022 showed stable to mild decrease in size of treated liver metastasis. No new lesions -She is tolerating chemotherapy well overall, will continue -Her restaging CT scan on August 31, 2022 showed stable liver metastasis, and a stable 7 mm left lung nodule, indeterminate.  I personally reviewed the scan images with patient -Will continue current therapy.  We discussed the option of maintenance therapy with irinotecan and bevacizumab in future.  She is tolerating current FOLFIRI and bevacizumab well, will continue. -Restaging CT 12/17/22 showed stable disease, will continue chemo  -Her  tumor marker CEA has been slightly trending up lately, will follow-up closely   Assessment and Plan    Metastatic Colon Cancer Stable on current chemotherapy regimen with occasional diarrhea likely related to diet. Tumor marker has increased slightly since last visit, but recent scan showed stable disease. Patient reports night sweats and occasional nausea, potentially related to chemotherapy. -Continue current chemotherapy regimen. -Monitor tumor marker closely; consider earlier scan or PET scan if marker continues to rise. -Encourage use of nausea medication as needed. -Next cycle of treatment scheduled for November 13 and December 4. -Review medication list and discontinue unnecessary medications.  General Health Maintenance -Continue to monitor weight, blood pressure, and blood counts. -Check kidney and liver function before next chemotherapy session.     Plan -Lab reviewed, CMP still pending, if adequate for treatment, will proceed chemotherapy today at same dose -Follow-up in 2 weeks before next cycle chemo    SUMMARY OF ONCOLOGIC HISTORY: Oncology History  metastatic colon cancer to liver  06/20/2019 Procedure   Colonoscopy by Dr Meridee Score 06/20/19  IMPRESSION -Seven 3 to 10 mm polyps in the sigmoid colon, in the transverse colon and in the escending colon, removed with a cold snare. Resected and retrireved.  -One 20mm polyp in the descending colon. Biopsies. Tattoes.  -Mediaum sized lipoma in the ascending colon.   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 See Comment B. Colon, Ascending, polyp, Polypectomy:  -TUBULAR ADENOMA -No high grade dysplasia or malignancy.  C. Colon, TRansverse, Polyo, polectomy:  -TUBULAR ADENOMA -No high grade dysplasia or malignancy.  D. Colon, Sigmoid, Polyp, Polypectomy:  -HYPERPLASTIC POLYP   11/07/2019 Imaging   US Abdomen 11/07/19  IMPRESSION:  1. Two  solid masses in the liver are nonspecific. Recommend MRI abdomen with and without contrast for further evaluation.   12/15/2019 Imaging   MRI Abdomen 12/15/19  IMPRESSION: 1. There are two large masses in the liver with appearance favoring metastatic disease or hepatocellular carcinoma or cholangiocarcinoma. A benign etiology is highly unlikely given the enhancement pattern and associated adenopathy. 2. Considerable porta hepatis and retroperitoneal adenopathy. Some of the confluent porta hepatis tumor is potentially infiltrative and abuts the pancreatic body along its right upper margin, making it difficult to completely exclude the possibility of pancreatic adenocarcinoma primary. Possibilities helpful in further workup might include tissue diagnosis, endoscopic ultrasound, or nuclear medicine PET-CT. 3. Pancreas divisum. 4. Lumbar spondylosis and degenerative disc disease. 5. Despite efforts by the technologist and patient, motion artifact is present on today's exam and could not be eliminated. This reduces exam sensitivity and specificity.   12/22/2019 Procedure   Upper Endoscopy by Dr Elnoria Howard 12/22/19  IMPRESSION - One lymph node was visualized and measured in the porta hepatis region. Fine needle aspiration performed    12/22/2019 Initial Biopsy   A. LIVER, PORTA HEPATIS MASS, FINE NEEDLE 12/22/19 ASPIRATION:  FINAL MICROSCOPIC DIAGNOSIS:  - Malignant cells consistent with metastatic adenocarcinoma   01/01/2020 Initial Diagnosis   Intrahepatic cholangiocarcinoma (HCC)   01/08/2020 Initial Biopsy   FINAL MICROSCOPIC DIAGNOSIS:   A. LIVER, LEFT LOBE, BIOPSY:  - Metastatic adenocarcinoma, consistent with a colorectal primary.  See  comment      COMMENT:   Immunohistochemical stains show the tumor cells are positive for CK20  and CDX2 but negative for CK7, consistent with above interpretation.  Dr. Mosetta Putt was notified on 01/10/2020   01/08/2020 Genetic Testing    [name]  [name] wildtype and [name]/[name] mutations which make her eligible for target biological agent Vectibix.   01/24/2020 Procedure   PAC placed 01/24/20   01/25/2020 -  Chemotherapy   first line FOLFOX starting 01/25/2020, Vextibix  added with C2 (02/06/20)   02/06/2020 - 02/06/2022 Chemotherapy   Patient is on Treatment Plan : COLORECTAL Panitumumab q14d ([name] - Type Gene Only)     06/20/2020 Imaging   CT CAP  IMPRESSION: 1. Continued interval reduction in size and conspicuity of a subsolid nodule of the peripheral left upper lobe. 2. Unchanged prominent pretracheal and subcarinal lymph nodes. 3. Redemonstrated partially calcified low-attenuation liver masses, slightly decreased in size compared to prior examination. 4. Slight interval decrease in size of a portacaval lymph node or conglomerate and retroperitoneal lymph nodes. 5. Findings are consistent with continued treatment response of nodal, pulmonary, and hepatic metastatic disease. 6. Coronary artery disease.   Aortic Atherosclerosis (ICD10-I70.0).     10/09/2020 Imaging   IMPRESSION: 1. Slight decrease in size of dominant liver mass with smaller liver mass within 1-2 mm of prior measurement. 2. Stable appearance of celiac lymph and retroperitoneal lymph nodes. Dominant node with calcification in the gastrohepatic ligament as described. 3. Continued decrease in size of LEFT upper lobe nodule. 4. Three-vessel coronary artery calcification. 5. Aortic atherosclerosis.   03/25/2022 -  Chemotherapy   Patient is on Treatment Plan : COLORECTAL FOLFIRI + Bevacizumab q14d     08/31/2022 Imaging    IMPRESSION: 1. Unchanged, densely calcified liver lesions, consistent with treated metastatic disease. 2. Unchanged, irregular subpleural opacity of the peripheral left upper lobe. 3. Unchanged subcentimeter gastrohepatic ligament lymph node. 4. No evidence of new metastatic disease in the chest, abdomen,  or pelvis. 5. Status post  hysterectomy. 6. Coronary artery disease. 7. Aortic valve calcifications. Correlate for echocardiographic evidence of aortic valve dysfunction.        Discussed the use of AI scribe software for clinical note transcription with the patient, who gave verbal consent to proceed.  History of Present Illness   The patient, a 67 year old with metastatic colon cancer, presents for a routine follow-up. She reports occasional diarrhea, which she attributes to dietary changes, such as eating apples. She denies any new symptoms or issues since her last visit. She has been receiving chemotherapy and reports no adverse effects from the treatment. She has not noticed any significant changes in her weight, and her blood pressure has remained stable. She has not required any blood transfusions, and her platelet count is within normal limits. She is scheduled for another round of chemotherapy today.         All other systems were reviewed with the patient and are negative.  MEDICAL HISTORY:  Past Medical History:  Diagnosis Date   Arthritis    Diabetes mellitus without complication (HCC)    Hypertension    met colon ca to liver 01/2020    SURGICAL HISTORY: Past Surgical History:  Procedure Laterality Date   ABDOMINAL HYSTERECTOMY     ANTERIOR AND POSTERIOR REPAIR WITH SACROSPINOUS FIXATION N/A 07/16/2016   Procedure: ANTERIOR AND POSTERIOR REPAIR WITH SACROSPINOUS FIXATION;  Surgeon: Harold Hedge, MD;  Location: [facility];  Service: Gynecology;  Laterality: N/A;   CYSTOSCOPY  07/16/2016   Procedure: CYSTOSCOPY;  Surgeon: Harold Hedge, MD;  Location: [facility];  Service: Gynecology;;   ESOPHAGOGASTRODUODENOSCOPY (EGD) WITH PROPOFOL N/A 12/22/2019   Procedure: ESOPHAGOGASTRODUODENOSCOPY (EGD) WITH PROPOFOL;  Surgeon: Jeani Hawking, MD;  Location: [facility];  Service: Endoscopy;  Laterality: N/A;   FINE NEEDLE ASPIRATION N/A 12/22/2019   Procedure: FINE NEEDLE ASPIRATION  (FNA) LINEAR;  Surgeon: Jeani Hawking, MD;  Location: [facility];  Service: Endoscopy;  Laterality: N/A;   IR IMAGING GUIDED PORT INSERTION  01/24/2020   LAPAROSCOPIC VAGINAL HYSTERECTOMY WITH SALPINGECTOMY Bilateral 07/16/2016   Procedure: LAPAROSCOPIC ASSISTED VAGINAL HYSTERECTOMY WITH SALPINGECTOMY;  Surgeon: Harold Hedge, MD;  Location: [facility];  Service: Gynecology;  Laterality: Bilateral;   MYOMECTOMY ABDOMINAL APPROACH     THYROID SURGERY     tyroid     UPPER ESOPHAGEAL ENDOSCOPIC ULTRASOUND (EUS) N/A 12/22/2019   Procedure: UPPER ESOPHAGEAL ENDOSCOPIC ULTRASOUND (EUS);  Surgeon: Jeani Hawking, MD;  Location: [facility];  Service: Endoscopy;  Laterality: N/A;    I have reviewed the social history and family history with the patient and they are unchanged from previous note.  ALLERGIES:  is allergic to oxaliplatin.  MEDICATIONS:  Current Outpatient Medications  Medication Sig Dispense Refill   atorvastatin (LIPITOR) 20 MG tablet Take 20 mg by mouth at bedtime.   1   hydrochlorothiazide (MICROZIDE) 12.5 MG capsule Take 12.5 mg by mouth daily.     KLOR-CON M20 20 MEQ tablet TAKE 1 TABLET TWICE A DAY 180 tablet 3   lidocaine-prilocaine (EMLA) cream Apply 1 Application topically as needed. 30 g 1   magic mouthwash w/lidocaine SOLN Take 5 mLs by mouth 4 (four) times daily. 475 mL 2   ondansetron (ZOFRAN) 8 MG tablet Take 1 tablet (8 mg total) by mouth every 8 (eight) hours as needed. 20 tablet 0   prochlorperazine (COMPAZINE) 10 MG tablet Take 1 tablet (10 mg total) by mouth every 6 (six) hours as needed for nausea or vomiting. 30 tablet 2   spironolactone (ALDACTONE)  25 MG tablet Take 25 mg by mouth daily with breakfast.     No current facility-administered medications for this visit.    PHYSICAL EXAMINATION: ECOG PERFORMANCE STATUS: 1 - Symptomatic but completely ambulatory  Vitals:   01/06/23 0902  BP: (!) 128/90  Pulse: 69  Resp: 18  Temp: 97.9 F (36.6 C)  SpO2: 100%    Wt Readings from Last 3 Encounters:  01/06/23 183 lb (83 kg)  12/23/22 183 lb 8 oz (83.2 kg)  12/09/22 184 lb 3.2 oz (83.6 kg)     GENERAL:alert, no distress and comfortable SKIN: skin color, texture, turgor are normal, no rashes or significant lesions EYES: normal, Conjunctiva are pink and non-injected, sclera clear NECK: supple, thyroid normal size, non-tender, without nodularity LYMPH:  no palpable lymphadenopathy in the cervical, axillary  LUNGS: clear to auscultation and percussion with normal breathing effort HEART: regular rate & rhythm and no murmurs and no lower extremity edema ABDOMEN:abdomen soft, non-tender and normal bowel sounds Musculoskeletal:no cyanosis of digits and no clubbing  NEURO: alert & oriented x 3 with fluent speech, no focal motor/sensory deficits   LABORATORY DATA:  I have reviewed the data as listed    Latest Ref Rng & Units 01/06/2023    8:34 AM 12/23/2022    9:26 AM 12/09/2022    9:55 AM  CBC  WBC 4.0 - 10.5 K/uL 11.6  9.9  6.7   Hemoglobin 12.0 - 15.0 g/dL 65.7  84.6  96.2   Hematocrit 36.0 - 46.0 % 37.1  37.8  36.5   Platelets 150 - 400 K/uL 292  308  187         Latest Ref Rng & Units 12/23/2022    9:26 AM 12/09/2022    9:55 AM 11/25/2022   10:00 AM  CMP  Glucose 70 - 99 mg/dL 952  841  90   BUN 8 - 23 mg/dL 10  10  12    Creatinine 0.44 - 1.00 mg/dL 3.24  4.01  0.27   Sodium 135 - 145 mmol/L 137  140  138   Potassium 3.5 - 5.1 mmol/L 3.7  3.6  4.0   Chloride 98 - 111 mmol/L 102  105  104   CO2 22 - 32 mmol/L 27  29  28    Calcium 8.9 - 10.3 mg/dL 9.2  9.4  9.5   Total Protein 6.5 - 8.1 g/dL 7.4  7.2  7.6   Total Bilirubin 0.3 - 1.2 mg/dL 0.2  0.3  0.4   Alkaline Phos 38 - 126 U/L 105  97  83   AST 15 - 41 U/L 12  12  15    ALT 0 - 44 U/L 13  13  13        RADIOGRAPHIC STUDIES: I have personally reviewed the radiological images as listed and agreed with the findings in the report. No results found.    No orders of the  defined types were placed in this encounter.  All questions were answered. The patient knows to call the clinic with any problems, questions or concerns. No barriers to learning was detected. The total time spent in the appointment was 25 minutes.     Malachy Mood, MD 01/06/2023

## 2023-01-08 ENCOUNTER — Inpatient Hospital Stay: Payer: Medicare (Managed Care) | Attending: Hematology

## 2023-01-08 DIAGNOSIS — T451X5D Adverse effect of antineoplastic and immunosuppressive drugs, subsequent encounter: Secondary | ICD-10-CM | POA: Insufficient documentation

## 2023-01-08 DIAGNOSIS — Z5112 Encounter for antineoplastic immunotherapy: Secondary | ICD-10-CM | POA: Insufficient documentation

## 2023-01-08 DIAGNOSIS — Z5111 Encounter for antineoplastic chemotherapy: Secondary | ICD-10-CM | POA: Insufficient documentation

## 2023-01-08 DIAGNOSIS — C221 Intrahepatic bile duct carcinoma: Secondary | ICD-10-CM

## 2023-01-08 DIAGNOSIS — Z90710 Acquired absence of both cervix and uterus: Secondary | ICD-10-CM | POA: Insufficient documentation

## 2023-01-08 DIAGNOSIS — C7800 Secondary malignant neoplasm of unspecified lung: Secondary | ICD-10-CM | POA: Insufficient documentation

## 2023-01-08 DIAGNOSIS — Z79899 Other long term (current) drug therapy: Secondary | ICD-10-CM | POA: Insufficient documentation

## 2023-01-08 DIAGNOSIS — G620 Drug-induced polyneuropathy: Secondary | ICD-10-CM | POA: Insufficient documentation

## 2023-01-08 DIAGNOSIS — C787 Secondary malignant neoplasm of liver and intrahepatic bile duct: Secondary | ICD-10-CM | POA: Insufficient documentation

## 2023-01-08 DIAGNOSIS — Z5189 Encounter for other specified aftercare: Secondary | ICD-10-CM | POA: Insufficient documentation

## 2023-01-08 DIAGNOSIS — K5900 Constipation, unspecified: Secondary | ICD-10-CM | POA: Insufficient documentation

## 2023-01-08 DIAGNOSIS — C19 Malignant neoplasm of rectosigmoid junction: Secondary | ICD-10-CM | POA: Insufficient documentation

## 2023-01-20 ENCOUNTER — Inpatient Hospital Stay: Payer: Medicare (Managed Care)

## 2023-01-20 ENCOUNTER — Inpatient Hospital Stay (HOSPITAL_BASED_OUTPATIENT_CLINIC_OR_DEPARTMENT_OTHER): Payer: Medicare (Managed Care) | Admitting: Nurse Practitioner

## 2023-01-20 ENCOUNTER — Encounter: Payer: Self-pay | Admitting: Nurse Practitioner

## 2023-01-20 VITALS — BP 147/82 | HR 55 | Temp 97.7°F | Resp 18 | Ht 66.0 in | Wt 183.5 lb

## 2023-01-20 DIAGNOSIS — K5900 Constipation, unspecified: Secondary | ICD-10-CM | POA: Diagnosis not present

## 2023-01-20 DIAGNOSIS — C7800 Secondary malignant neoplasm of unspecified lung: Secondary | ICD-10-CM | POA: Diagnosis not present

## 2023-01-20 DIAGNOSIS — Z95828 Presence of other vascular implants and grafts: Secondary | ICD-10-CM

## 2023-01-20 DIAGNOSIS — C787 Secondary malignant neoplasm of liver and intrahepatic bile duct: Secondary | ICD-10-CM | POA: Diagnosis present

## 2023-01-20 DIAGNOSIS — Z79899 Other long term (current) drug therapy: Secondary | ICD-10-CM | POA: Diagnosis not present

## 2023-01-20 DIAGNOSIS — Z5112 Encounter for antineoplastic immunotherapy: Secondary | ICD-10-CM | POA: Diagnosis present

## 2023-01-20 DIAGNOSIS — T451X5D Adverse effect of antineoplastic and immunosuppressive drugs, subsequent encounter: Secondary | ICD-10-CM | POA: Diagnosis not present

## 2023-01-20 DIAGNOSIS — G620 Drug-induced polyneuropathy: Secondary | ICD-10-CM | POA: Diagnosis not present

## 2023-01-20 DIAGNOSIS — Z90710 Acquired absence of both cervix and uterus: Secondary | ICD-10-CM | POA: Diagnosis not present

## 2023-01-20 DIAGNOSIS — Z5189 Encounter for other specified aftercare: Secondary | ICD-10-CM | POA: Diagnosis not present

## 2023-01-20 DIAGNOSIS — C221 Intrahepatic bile duct carcinoma: Secondary | ICD-10-CM

## 2023-01-20 DIAGNOSIS — Z5111 Encounter for antineoplastic chemotherapy: Secondary | ICD-10-CM | POA: Diagnosis not present

## 2023-01-20 DIAGNOSIS — C189 Malignant neoplasm of colon, unspecified: Secondary | ICD-10-CM

## 2023-01-20 DIAGNOSIS — C19 Malignant neoplasm of rectosigmoid junction: Secondary | ICD-10-CM | POA: Diagnosis present

## 2023-01-20 LAB — CBC WITH DIFFERENTIAL (CANCER CENTER ONLY)
Abs Immature Granulocytes: 0.01 10*3/uL (ref 0.00–0.07)
Basophils Absolute: 0.1 10*3/uL (ref 0.0–0.1)
Basophils Relative: 1 %
Eosinophils Absolute: 0.2 10*3/uL (ref 0.0–0.5)
Eosinophils Relative: 5 %
HCT: 35.9 % — ABNORMAL LOW (ref 36.0–46.0)
Hemoglobin: 11.1 g/dL — ABNORMAL LOW (ref 12.0–15.0)
Immature Granulocytes: 0 %
Lymphocytes Relative: 29 %
Lymphs Abs: 1.2 10*3/uL (ref 0.7–4.0)
MCH: 27.8 pg (ref 26.0–34.0)
MCHC: 30.9 g/dL (ref 30.0–36.0)
MCV: 90 fL (ref 80.0–100.0)
Monocytes Absolute: 0.4 10*3/uL (ref 0.1–1.0)
Monocytes Relative: 10 %
Neutro Abs: 2.3 10*3/uL (ref 1.7–7.7)
Neutrophils Relative %: 55 %
Platelet Count: 306 10*3/uL (ref 150–400)
RBC: 3.99 MIL/uL (ref 3.87–5.11)
RDW: 16.5 % — ABNORMAL HIGH (ref 11.5–15.5)
WBC Count: 4.2 10*3/uL (ref 4.0–10.5)
nRBC: 0 % (ref 0.0–0.2)

## 2023-01-20 LAB — CMP (CANCER CENTER ONLY)
ALT: 9 U/L (ref 0–44)
AST: 11 U/L — ABNORMAL LOW (ref 15–41)
Albumin: 3.9 g/dL (ref 3.5–5.0)
Alkaline Phosphatase: 81 U/L (ref 38–126)
Anion gap: 4 — ABNORMAL LOW (ref 5–15)
BUN: 9 mg/dL (ref 8–23)
CO2: 28 mmol/L (ref 22–32)
Calcium: 9.2 mg/dL (ref 8.9–10.3)
Chloride: 107 mmol/L (ref 98–111)
Creatinine: 0.62 mg/dL (ref 0.44–1.00)
GFR, Estimated: >60 mL/min (ref >60)
Glucose, Bld: 107 mg/dL — ABNORMAL HIGH (ref 70–99)
Potassium: 3.7 mmol/L (ref 3.5–5.1)
Sodium: 139 mmol/L (ref 135–145)
Total Bilirubin: 0.3 mg/dL (ref <1.2)
Total Protein: 7.2 g/dL (ref 6.5–8.1)

## 2023-01-20 LAB — CEA (ACCESS): CEA (CHCC): 13.75 ng/mL — ABNORMAL HIGH (ref 0.00–5.00)

## 2023-01-20 LAB — TOTAL PROTEIN, URINE DIPSTICK: Protein, ur: NEGATIVE mg/dL

## 2023-01-20 MED ORDER — ATROPINE SULFATE 1 MG/ML IV SOLN
0.5000 mg | Freq: Once | INTRAVENOUS | Status: AC | PRN
  Administered 2023-01-20: 0.5 mg via INTRAVENOUS
  Filled 2023-01-20: qty 1

## 2023-01-20 MED ORDER — FLUOROURACIL CHEMO INJECTION 5 GM/100ML
2400.0000 mg/m2 | INTRAVENOUS | Status: DC
  Administered 2023-01-20: 5000 mg via INTRAVENOUS
  Filled 2023-01-20: qty 100

## 2023-01-20 MED ORDER — HEPARIN SOD (PORK) LOCK FLUSH 100 UNIT/ML IV SOLN: 500.0000 [IU] | Freq: Once | INTRAVENOUS | Status: DC | PRN

## 2023-01-20 MED ORDER — SODIUM CHLORIDE 0.9 % IV SOLN
5.0000 mg/kg | Freq: Once | INTRAVENOUS | Status: AC
  Administered 2023-01-20: 400 mg via INTRAVENOUS
  Filled 2023-01-20: qty 16

## 2023-01-20 MED ORDER — LEUCOVORIN CALCIUM INJECTION 350 MG
400.0000 mg/m2 | Freq: Once | INTRAMUSCULAR | Status: AC
  Administered 2023-01-20: 780 mg via INTRAVENOUS
  Filled 2023-01-20: qty 25

## 2023-01-20 MED ORDER — SODIUM CHLORIDE 0.9 % IV SOLN: Freq: Once | INTRAVENOUS | Status: AC

## 2023-01-20 MED ORDER — SODIUM CHLORIDE 0.9 % IV SOLN
180.0000 mg/m2 | Freq: Once | INTRAVENOUS | Status: AC
  Administered 2023-01-20: 360 mg via INTRAVENOUS
  Filled 2023-01-20: qty 13

## 2023-01-20 MED ORDER — SODIUM CHLORIDE 0.9% FLUSH
10.0000 mL | Freq: Once | INTRAVENOUS | Status: AC
  Administered 2023-01-20: 10 mL

## 2023-01-20 MED ORDER — PALONOSETRON HCL INJECTION 0.25 MG/5ML
0.2500 mg | Freq: Once | INTRAVENOUS | Status: AC
  Administered 2023-01-20: 0.25 mg via INTRAVENOUS
  Filled 2023-01-20: qty 5

## 2023-01-20 MED ORDER — DEXAMETHASONE SODIUM PHOSPHATE 10 MG/ML IJ SOLN
10.0000 mg | Freq: Once | INTRAMUSCULAR | Status: AC
  Administered 2023-01-20: 10 mg via INTRAVENOUS
  Filled 2023-01-20: qty 1

## 2023-01-20 MED ORDER — SODIUM CHLORIDE 0.9 % IV SOLN: Freq: Once | INTRAVENOUS | Status: DC

## 2023-01-20 MED ORDER — SODIUM CHLORIDE 0.9% FLUSH: 10.0000 mL | INTRAVENOUS | Status: DC | PRN

## 2023-01-20 NOTE — Assessment & Plan Note (Addendum)
MSS, [name]/[name] wildtype -diagnosed 12/2019 by porta hepatis LN biopsy during EUS for work up of abdominal pain and liver lesions seen on [place] and MRI. Liver biopsy 01/08/20 confirmed metastasis from primary colorectal cancer. PET scan showed hypermetabolism to known liver mets, diffuse thoracic and abdominal lymphadenopathy, a 1.8 cm LUL pulmonary nodule, and splenic flexure of colon. -treated with first line FOLFOX 01/25/20 - 10/02/20, Vectibix added with C2. Oxali discontinued after C18 due to reaction. -switched to Xeloda 10/21/20, dose adjusted due to significant skin toxicity -due to cancer progression on recent staging scan, treatment has been changed to FOLFIRI and bevacizumab on 03/25/22, she is overall tolerating well -will repeat CT scan after next cycle chemo  -Restaging CT scan from 06/16/2022 showed stable to mild decrease in size of treated liver metastasis. No new lesions -She is tolerating chemotherapy well overall, will continue -Her restaging CT scan on August 31, 2022 showed stable liver metastasis, and a stable 7 mm left lung nodule, indeterminate.  -Will continue current therapy.  We discussed the option of maintenance therapy with irinotecan and bevacizumab in future.  She is tolerating current FOLFIRI and bevacizumab well, will continue. -Restaging CT 12/17/22 showed stable disease, will continue chemotherapy  -01/20/2023 - today, he presents for Cycle 21 day 1

## 2023-01-20 NOTE — Progress Notes (Signed)
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 Liver biopsy 01/08/20 confirmed metastasis from primary colorectal cancer. PET scan showed hypermetabolism to known liver mets, diffuse thoracic and abdominal lymphadenopathy, a 1.8 cm LUL pulmonary nodule, and splenic flexure of colon. -treated with first line FOLFOX 01/25/20 - 10/02/20, Vectibix added with C2. Oxali discontinued after C18 due to reaction. -switched to Xeloda 10/21/20, dose adjusted due to significant skin toxicity -due to cancer progression on recent staging scan, treatment has been changed to FOLFIRI and bevacizumab on 03/25/22, she is overall tolerating well -will repeat CT scan after next cycle chemo  -Restaging CT scan from 06/16/2022 showed stable to mild decrease in size of treated liver metastasis. No new lesions -She is tolerating chemotherapy well overall, will continue -Her restaging CT scan on August 31, 2022 showed stable liver metastasis, and a stable 7 mm left lung nodule, indeterminate.  -Will continue current therapy.  We discussed the option of maintenance therapy with irinotecan and bevacizumab in future.  She is tolerating current FOLFIRI and bevacizumab well, will continue. -Restaging CT 12/17/22 showed stable disease, will continue chemotherapy  -01/20/2023 - today, he presents for Cycle 21 day 1    Plan:  Labs reviewed  -CBC showing WBC 4.2; Hgb 11.1; Hct 35.9; Plt 3.6; Anc 2.3 -CMP - K 3.7; glucose 107; BUN 9; Creatinine 0.62; eGFR  >60; Ca 9.2; LFTs normal.   -CEA pending  She is clinically doing well.  -proceed with treatment FOLFIRI with bevacizumab.  Labs/flush, follow up, and treatment 02/10/2023 as scheduled.   The patient understands the plans discussed today and is in agreement with them.  She knows to contact our office if she develops concerns prior to her next appointment.  I provided 20 minutes of face-to-face time during this encounter and > 50% was spent counseling as documented under my assessment and plan.    Carlean Jews, NP  [facility] - [facility] [address] [place] [address] Dept: [phone] Dept Fax: [phone]   No orders of the defined types were placed in this encounter.     CHIEF COMPLAINT:  CC: f/u metastatic colon cancer   Current Treatment:  FOLFIRI and bevacizumab   INTERVAL HISTORY:  Lindsay [name] is here today for repeat clinical assessment. She reports doing well, overall. Has occasional constipation. Takes magnesium citrate if needed. This is effective. She has baseline neuropathy which predates initiation of chemotherapy. She denies chest pain, chest pressure, or shortness of breath. She denies headaches or visual disturbances. She denies abdominal pain, nausea, or vomiting. She denies fevers or chills. She denies pain. Her appetite is good. Her weight has been stable.  I have reviewed the past medical history, past surgical history, social history and family history with the patient and they are unchanged from previous note.  ALLERGIES:  is allergic to oxaliplatin.  MEDICATIONS:  Current Outpatient Medications  Medication Sig Dispense Refill   atorvastatin (LIPITOR) 20 MG tablet Take 20 mg by  mouth at bedtime.   1   hydrochlorothiazide (MICROZIDE) 12.5 MG capsule Take 12.5 mg by mouth daily.     KLOR-CON M20 20 MEQ tablet TAKE 1 TABLET TWICE A DAY 180 tablet 3   lidocaine-prilocaine  (EMLA) cream Apply 1 Application topically as needed. 30 g 1   magic mouthwash w/lidocaine SOLN Take 5 mLs by mouth 4 (four) times daily. 475 mL 2   ondansetron (ZOFRAN) 8 MG tablet Take 1 tablet (8 mg total) by mouth every 8 (eight) hours as needed. 20 tablet 0   prochlorperazine (COMPAZINE) 10 MG tablet Take 1 tablet (10 mg total) by mouth every 6 (six) hours as needed for nausea or vomiting. 30 tablet 2   spironolactone (ALDACTONE) 25 MG tablet Take 25 mg by mouth daily with breakfast.     No current facility-administered medications for this visit.   Facility-Administered Medications Ordered in Other Visits  Medication Dose Route Frequency Provider Last Rate Last Admin   0.9 %  sodium chloride infusion   Intravenous Once Malachy Mood, MD       fluorouracil (ADRUCIL) 5,000 mg in sodium chloride 0.9 % 150 mL chemo infusion  2,400 mg/m2 (Treatment Plan Recorded) Intravenous 1 day or 1 dose Malachy Mood, MD   Infusion Verify at 01/20/23 1414   heparin lock flush 100 unit/mL  500 Units Intracatheter Once PRN Malachy Mood, MD       sodium chloride flush (NS) 0.9 % injection 10 mL  10 mL Intracatheter PRN Malachy Mood, MD        HISTORY OF PRESENT ILLNESS:   Oncology History  metastatic colon cancer to liver  06/20/2019 Procedure   Colonoscopy by Dr Meridee Score 06/20/19  IMPRESSION -Seven 3 to 10 mm polyps in the sigmoid colon, in the transverse colon and in the escending colon, removed with a cold snare. Resected and retrireved.  -One 20mm polyp in the descending colon. Biopsies. Tattoes.  -Mediaum sized lipoma in the ascending colon.   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 See Comment B. Colon, Ascending, polyp, Polypectomy:  -TUBULAR ADENOMA -No high grade dysplasia or malignancy.  C. Colon, TRansverse, Polyo, polectomy:  -TUBULAR ADENOMA -No high grade dysplasia or malignancy.  D. Colon, Sigmoid, Polyp,  Polypectomy:  -HYPERPLASTIC POLYP   11/07/2019 Imaging   US Abdomen 11/07/19  IMPRESSION: 1. Two solid masses in the liver are nonspecific. Recommend MRI abdomen with and without contrast for further evaluation.   12/15/2019 Imaging   MRI Abdomen 12/15/19  IMPRESSION: 1. There are two large masses in the liver with appearance favoring metastatic disease or hepatocellular carcinoma or cholangiocarcinoma. A benign etiology is highly unlikely given the enhancement pattern and associated adenopathy. 2. Considerable porta hepatis and retroperitoneal adenopathy. Some of the confluent porta hepatis tumor is potentially infiltrative and abuts the pancreatic body along its right upper margin, making it difficult to completely exclude the possibility of pancreatic adenocarcinoma primary. Possibilities helpful in further workup might include tissue diagnosis, endoscopic ultrasound, or nuclear medicine PET-CT. 3. Pancreas divisum. 4. Lumbar spondylosis and degenerative disc disease. 5. Despite efforts by the technologist and patient, motion artifact is present on today's exam and could not be eliminated. This reduces exam sensitivity and specificity.   12/22/2019 Procedure   Upper Endoscopy by Dr Elnoria Howard 12/22/19  IMPRESSION - One lymph node was visualized and measured in the porta hepatis region. Fine needle aspiration performed    12/22/2019 Initial Biopsy  A. LIVER, PORTA HEPATIS MASS, FINE NEEDLE 12/22/19 ASPIRATION:  FINAL MICROSCOPIC DIAGNOSIS:  - Malignant cells consistent with metastatic adenocarcinoma   01/01/2020 Initial Diagnosis   Intrahepatic cholangiocarcinoma (HCC)   01/08/2020 Initial Biopsy   FINAL MICROSCOPIC DIAGNOSIS:   A. LIVER, LEFT LOBE, BIOPSY:  - Metastatic adenocarcinoma, consistent with a colorectal primary.  See  comment      COMMENT:   Immunohistochemical stains show the tumor cells are positive for CK20  and CDX2 but negative for CK7, consistent  with above interpretation.  Dr. Mosetta Putt was notified on 01/10/2020   01/08/2020 Genetic Testing   [name]  [name] wildtype and [name]/[name] mutations which make her eligible for target biological agent Vectibix.   01/24/2020 Procedure   PAC placed 01/24/20   01/25/2020 -  Chemotherapy   first line FOLFOX starting 01/25/2020, Vextibix  added with C2 (02/06/20)   02/06/2020 - 02/06/2022 Chemotherapy   Patient is on Treatment Plan : COLORECTAL Panitumumab q14d ([name] Wild - Type Gene Only)     06/20/2020 Imaging   CT CAP  IMPRESSION: 1. Continued interval reduction in size and conspicuity of a subsolid nodule of the peripheral left upper lobe. 2. Unchanged prominent pretracheal and subcarinal lymph nodes. 3. Redemonstrated partially calcified low-attenuation liver masses, slightly decreased in size compared to prior examination. 4. Slight interval decrease in size of a portacaval lymph node or conglomerate and retroperitoneal lymph nodes. 5. Findings are consistent with continued treatment response of nodal, pulmonary, and hepatic metastatic disease. 6. Coronary artery disease.   Aortic Atherosclerosis (ICD10-I70.0).     10/09/2020 Imaging   IMPRESSION: 1. Slight decrease in size of dominant liver mass with smaller liver mass within 1-2 mm of prior measurement. 2. Stable appearance of celiac lymph and retroperitoneal lymph nodes. Dominant node with calcification in the gastrohepatic ligament as described. 3. Continued decrease in size of LEFT upper lobe nodule. 4. Three-vessel coronary artery calcification. 5. Aortic atherosclerosis.   03/25/2022 -  Chemotherapy   Patient is on Treatment Plan : COLORECTAL FOLFIRI + Bevacizumab q14d     08/31/2022 Imaging    IMPRESSION: 1. Unchanged, densely calcified liver lesions, consistent with treated metastatic disease. 2. Unchanged, irregular subpleural opacity of the peripheral left upper lobe. 3. Unchanged subcentimeter gastrohepatic  ligament lymph node. 4. No evidence of new metastatic disease in the chest, abdomen, or pelvis. 5. Status post hysterectomy. 6. Coronary artery disease. 7. Aortic valve calcifications. Correlate for echocardiographic evidence of aortic valve dysfunction.         REVIEW OF SYSTEMS:   Constitutional: Denies fevers, chills or abnormal weight loss Eyes: Denies blurriness of vision Ears, nose, mouth, throat, and face: Denies mucositis or sore throat Respiratory: Denies cough, dyspnea or wheezes Cardiovascular: Denies palpitation, chest discomfort or lower extremity swelling Gastrointestinal:  Denies nausea, heartburn or change in bowel habits Skin: Denies abnormal skin rashes Lymphatics: Denies new lymphadenopathy or easy bruising Neurological:Denies numbness, tingling or new weaknesses Behavioral/Psych: Mood is stable, no new changes  All other systems were reviewed with the patient and are negative.   VITALS:   Today's Vitals   01/20/23 1038 01/20/23 1039 01/20/23 1041  BP: (!) 155/97 (!) 147/82   Pulse: (!) 55    Resp: 18    Temp: 97.7 F (36.5 C)    TempSrc: Temporal    SpO2: 97%    Weight: 183 lb 8 oz (83.2 kg)    Height: 5\' 6"  (1.676 m)    PainSc:   0-No pain  Body mass index is 29.62 kg/m.    Wt Readings from Last 3 Encounters:  01/20/23 183 lb 8 oz (83.2 kg)  01/06/23 183 lb (83 kg)  12/23/22 183 lb 8 oz (83.2 kg)    Body mass index is 29.62 kg/m.  Performance status (ECOG): 1 - Symptomatic but completely ambulatory  PHYSICAL EXAM:   GENERAL:alert, no distress and comfortable SKIN: skin color, texture, turgor are normal, no rashes or significant lesions EYES: normal, Conjunctiva are pink and non-injected, sclera clear OROPHARYNX:no exudate, no erythema and lips, buccal mucosa, and tongue normal  NECK: supple, thyroid normal size, non-tender, without nodularity LYMPH:  no palpable lymphadenopathy in the cervical, axillary or inguinal LUNGS: clear to  auscultation and percussion with normal breathing effort HEART: regular rate & rhythm and no murmurs and no lower extremity edema ABDOMEN:abdomen soft, non-tender and normal bowel sounds Musculoskeletal:no cyanosis of digits and no clubbing  NEURO: alert & oriented x 3 with fluent speech, no focal motor/sensory deficits  LABORATORY DATA:  I have reviewed the data as listed    Component Value Date/Time   NA 139 01/20/2023 0946   K 3.7 01/20/2023 0946   CL 107 01/20/2023 0946   CO2 28 01/20/2023 0946   GLUCOSE 107 (H) 01/20/2023 0946   BUN 9 01/20/2023 0946   CREATININE 0.62 01/20/2023 0946   CALCIUM 9.2 01/20/2023 0946   PROT 7.2 01/20/2023 0946   ALBUMIN 3.9 01/20/2023 0946   AST 11 (L) 01/20/2023 0946   ALT 9 01/20/2023 0946   ALKPHOS 81 01/20/2023 0946   BILITOT 0.3 01/20/2023 0946   GFRNONAA >60 01/20/2023 0946   GFRAA >60 07/09/2016 0836     Lab Results  Component Value Date   WBC 4.2 01/20/2023   NEUTROABS 2.3 01/20/2023   HGB 11.1 (L) 01/20/2023   HCT 35.9 (L) 01/20/2023   MCV 90.0 01/20/2023   PLT 306 01/20/2023

## 2023-01-20 NOTE — Patient Instructions (Signed)
[facility] - [facility] OF [facility]  Discharge Instructions: Thank you for choosing [facility] to provide your oncology and hematology care.   If you have a lab appointment with the Cancer Center, please go directly to the Cancer Center and check in at the registration area.   Wear comfortable clothing and clothing appropriate for easy access to any Portacath or PICC line.   We strive to give you quality time with your provider. You may need to reschedule your appointment if you arrive late (15 or more minutes).  Arriving late affects you and other patients whose appointments are after yours.  Also, if you miss three or more appointments without notifying the office, you may be dismissed from the clinic at the provider's discretion.      For prescription refill requests, have your pharmacy contact our office and allow 72 hours for refills to be completed.    Today you received the following chemotherapy and/or immunotherapy agents: Bevacizumab, Irinotecan, Leucovorin, Fluorouracil.       To help prevent nausea and vomiting after your treatment, we encourage you to take your nausea medication as directed.  BELOW ARE SYMPTOMS THAT SHOULD BE REPORTED IMMEDIATELY: *FEVER GREATER THAN 100.4 F (38 C) OR HIGHER *CHILLS OR SWEATING *NAUSEA AND VOMITING THAT IS NOT CONTROLLED WITH YOUR NAUSEA MEDICATION *UNUSUAL SHORTNESS OF BREATH *UNUSUAL BRUISING OR BLEEDING *URINARY PROBLEMS (pain or burning when urinating, or frequent urination) *BOWEL PROBLEMS (unusual diarrhea, constipation, pain near the anus) TENDERNESS IN MOUTH AND THROAT WITH OR WITHOUT PRESENCE OF ULCERS (sore throat, sores in mouth, or a toothache) UNUSUAL RASH, SWELLING OR PAIN  UNUSUAL VAGINAL DISCHARGE OR ITCHING   Items with * indicate a potential emergency and should be followed up as soon as possible or go to the Emergency Department if any problems should occur.  Please show  the CHEMOTHERAPY ALERT CARD or IMMUNOTHERAPY ALERT CARD at check-in to the Emergency Department and triage nurse.  Should you have questions after your visit or need to cancel or reschedule your appointment, please contact [facility] - A DEPT OF [facility]  Dept: [phone]  and follow the prompts.  Office hours are 8:00 a.m. to 4:30 p.m. Monday - Friday. Please note that voicemails left after 4:00 p.m. may not be returned until the following business day.  We are closed weekends and major holidays. You have access to a nurse at all times for urgent questions. Please call the main number to the clinic Dept: [phone] and follow the prompts.   For any non-urgent questions, you may also contact your provider using MyChart. We now offer e-Visits for anyone 2 and older to request care online for non-urgent symptoms. For details visit [email or web address].   Also download the MyChart app! Go to the app store, search "MyChart", open the app, select [facility], and log in with your MyChart username and password.

## 2023-01-22 ENCOUNTER — Inpatient Hospital Stay: Payer: Medicare (Managed Care)

## 2023-01-22 DIAGNOSIS — C221 Intrahepatic bile duct carcinoma: Secondary | ICD-10-CM

## 2023-01-22 DIAGNOSIS — Z5112 Encounter for antineoplastic immunotherapy: Secondary | ICD-10-CM | POA: Diagnosis not present

## 2023-01-22 MED ORDER — SODIUM CHLORIDE 0.9% FLUSH
10.0000 mL | INTRAVENOUS | Status: DC | PRN
  Administered 2023-01-22: 10 mL

## 2023-01-22 MED ORDER — PEGFILGRASTIM INJECTION 6 MG/0.6ML ~~LOC~~
6.0000 mg | PREFILLED_SYRINGE | Freq: Once | SUBCUTANEOUS | Status: AC
  Administered 2023-01-22: 6 mg via SUBCUTANEOUS
  Filled 2023-01-22: qty 0.6

## 2023-01-22 MED ORDER — HEPARIN SOD (PORK) LOCK FLUSH 100 UNIT/ML IV SOLN
500.0000 [IU] | Freq: Once | INTRAVENOUS | Status: AC | PRN
  Administered 2023-01-22: 500 [IU]

## 2023-02-03 ENCOUNTER — Encounter: Payer: Self-pay | Admitting: Hematology

## 2023-02-10 ENCOUNTER — Encounter: Payer: Self-pay | Admitting: Hematology

## 2023-02-10 ENCOUNTER — Inpatient Hospital Stay: Payer: Medicare (Managed Care) | Attending: Hematology

## 2023-02-10 ENCOUNTER — Inpatient Hospital Stay (HOSPITAL_BASED_OUTPATIENT_CLINIC_OR_DEPARTMENT_OTHER): Payer: Medicare (Managed Care) | Admitting: Hematology

## 2023-02-10 ENCOUNTER — Inpatient Hospital Stay: Payer: Medicare (Managed Care)

## 2023-02-10 VITALS — BP 136/82 | HR 71 | Temp 97.9°F | Resp 18 | Wt 182.7 lb

## 2023-02-10 DIAGNOSIS — Z90710 Acquired absence of both cervix and uterus: Secondary | ICD-10-CM | POA: Insufficient documentation

## 2023-02-10 DIAGNOSIS — Z5111 Encounter for antineoplastic chemotherapy: Secondary | ICD-10-CM | POA: Insufficient documentation

## 2023-02-10 DIAGNOSIS — Z5189 Encounter for other specified aftercare: Secondary | ICD-10-CM | POA: Diagnosis not present

## 2023-02-10 DIAGNOSIS — Z79899 Other long term (current) drug therapy: Secondary | ICD-10-CM | POA: Diagnosis not present

## 2023-02-10 DIAGNOSIS — C221 Intrahepatic bile duct carcinoma: Secondary | ICD-10-CM | POA: Diagnosis not present

## 2023-02-10 DIAGNOSIS — C7800 Secondary malignant neoplasm of unspecified lung: Secondary | ICD-10-CM | POA: Insufficient documentation

## 2023-02-10 DIAGNOSIS — C19 Malignant neoplasm of rectosigmoid junction: Secondary | ICD-10-CM | POA: Diagnosis present

## 2023-02-10 DIAGNOSIS — C189 Malignant neoplasm of colon, unspecified: Secondary | ICD-10-CM

## 2023-02-10 DIAGNOSIS — Z95828 Presence of other vascular implants and grafts: Secondary | ICD-10-CM

## 2023-02-10 DIAGNOSIS — C787 Secondary malignant neoplasm of liver and intrahepatic bile duct: Secondary | ICD-10-CM | POA: Insufficient documentation

## 2023-02-10 DIAGNOSIS — Z5112 Encounter for antineoplastic immunotherapy: Secondary | ICD-10-CM | POA: Diagnosis present

## 2023-02-10 LAB — CMP (CANCER CENTER ONLY)
ALT: 14 U/L (ref 0–44)
AST: 15 U/L (ref 15–41)
Albumin: 4 g/dL (ref 3.5–5.0)
Alkaline Phosphatase: 80 U/L (ref 38–126)
Anion gap: 4 — ABNORMAL LOW (ref 5–15)
BUN: 11 mg/dL (ref 8–23)
CO2: 28 mmol/L (ref 22–32)
Calcium: 9.4 mg/dL (ref 8.9–10.3)
Chloride: 105 mmol/L (ref 98–111)
Creatinine: 0.59 mg/dL (ref 0.44–1.00)
GFR, Estimated: >60 mL/min (ref >60)
Glucose, Bld: 127 mg/dL — ABNORMAL HIGH (ref 70–99)
Potassium: 3.7 mmol/L (ref 3.5–5.1)
Sodium: 137 mmol/L (ref 135–145)
Total Bilirubin: 0.3 mg/dL (ref <1.2)
Total Protein: 7 g/dL (ref 6.5–8.1)

## 2023-02-10 LAB — CBC WITH DIFFERENTIAL (CANCER CENTER ONLY)
Abs Immature Granulocytes: 0.02 10*3/uL (ref 0.00–0.07)
Basophils Absolute: 0.1 10*3/uL (ref 0.0–0.1)
Basophils Relative: 1 %
Eosinophils Absolute: 0.1 10*3/uL (ref 0.0–0.5)
Eosinophils Relative: 1 %
HCT: 37.4 % (ref 36.0–46.0)
Hemoglobin: 11.8 g/dL — ABNORMAL LOW (ref 12.0–15.0)
Immature Granulocytes: 0 %
Lymphocytes Relative: 16 %
Lymphs Abs: 1.2 10*3/uL (ref 0.7–4.0)
MCH: 28.4 pg (ref 26.0–34.0)
MCHC: 31.6 g/dL (ref 30.0–36.0)
MCV: 90.1 fL (ref 80.0–100.0)
Monocytes Absolute: 0.7 10*3/uL (ref 0.1–1.0)
Monocytes Relative: 10 %
Neutro Abs: 5.4 10*3/uL (ref 1.7–7.7)
Neutrophils Relative %: 72 %
Platelet Count: 273 10*3/uL (ref 150–400)
RBC: 4.15 MIL/uL (ref 3.87–5.11)
RDW: 17.3 % — ABNORMAL HIGH (ref 11.5–15.5)
WBC Count: 7.5 10*3/uL (ref 4.0–10.5)
nRBC: 0 % (ref 0.0–0.2)

## 2023-02-10 LAB — TOTAL PROTEIN, URINE DIPSTICK: Protein, ur: NEGATIVE mg/dL

## 2023-02-10 LAB — CEA (ACCESS): CEA (CHCC): 23.41 ng/mL — ABNORMAL HIGH (ref 0.00–5.00)

## 2023-02-10 MED ORDER — SODIUM CHLORIDE 0.9 % IV SOLN
180.0000 mg/m2 | Freq: Once | INTRAVENOUS | Status: AC
  Administered 2023-02-10: 360 mg via INTRAVENOUS
  Filled 2023-02-10: qty 15

## 2023-02-10 MED ORDER — SODIUM CHLORIDE 0.9 % IV SOLN
2400.0000 mg/m2 | INTRAVENOUS | Status: DC
  Administered 2023-02-10: 5000 mg via INTRAVENOUS
  Filled 2023-02-10: qty 100

## 2023-02-10 MED ORDER — SODIUM CHLORIDE 0.9 % IV SOLN
400.0000 mg/m2 | Freq: Once | INTRAVENOUS | Status: AC
  Administered 2023-02-10: 780 mg via INTRAVENOUS
  Filled 2023-02-10: qty 39

## 2023-02-10 MED ORDER — DEXAMETHASONE SODIUM PHOSPHATE 10 MG/ML IJ SOLN
10.0000 mg | Freq: Once | INTRAMUSCULAR | Status: AC
  Administered 2023-02-10: 10 mg via INTRAVENOUS
  Filled 2023-02-10: qty 1

## 2023-02-10 MED ORDER — ATROPINE SULFATE 1 MG/ML IV SOLN
0.5000 mg | Freq: Once | INTRAVENOUS | Status: AC | PRN
  Administered 2023-02-10: 0.5 mg via INTRAVENOUS
  Filled 2023-02-10: qty 1

## 2023-02-10 MED ORDER — SODIUM CHLORIDE 0.9% FLUSH
10.0000 mL | Freq: Once | INTRAVENOUS | Status: AC
  Administered 2023-02-10: 10 mL

## 2023-02-10 MED ORDER — BEVACIZUMAB-AWWB CHEMO INJECTION 400 MG/16ML
5.0000 mg/kg | Freq: Once | INTRAVENOUS | Status: AC
  Administered 2023-02-10: 400 mg via INTRAVENOUS
  Filled 2023-02-10: qty 16

## 2023-02-10 MED ORDER — SODIUM CHLORIDE 0.9 % IV SOLN: Freq: Once | INTRAVENOUS | Status: AC

## 2023-02-10 MED ORDER — PALONOSETRON HCL INJECTION 0.25 MG/5ML
0.2500 mg | Freq: Once | INTRAVENOUS | Status: AC
  Administered 2023-02-10: 0.25 mg via INTRAVENOUS
  Filled 2023-02-10: qty 5

## 2023-02-10 NOTE — Progress Notes (Signed)
Per Dr Mosetta Putt, ok to proceed without urine protein today.

## 2023-02-10 NOTE — Patient Instructions (Signed)
[facility] - [facility]  Discharge Instructions: Thank you for choosing [facility] to provide your oncology and hematology care.   If you have a lab appointment with the Cancer Center, please go directly to the Cancer Center and check in at the registration area.   Wear comfortable clothing and clothing appropriate for easy access to any Portacath or PICC line.   We strive to give you quality time with your provider. You may need to reschedule your appointment if you arrive late (15 or more minutes).  Arriving late affects you and other patients whose appointments are after yours.  Also, if you miss three or more appointments without notifying the office, you may be dismissed from the clinic at the provider's discretion.      For prescription refill requests, have your pharmacy contact our office and allow 72 hours for refills to be completed.    Today you received the following chemotherapy and/or immunotherapy agents: Bevacizumab, Irinotecan, & Fluorouracil      To help prevent nausea and vomiting after your treatment, we encourage you to take your nausea medication as directed.  BELOW ARE SYMPTOMS THAT SHOULD BE REPORTED IMMEDIATELY: *FEVER GREATER THAN 100.4 F (38 C) OR HIGHER *CHILLS OR SWEATING *NAUSEA AND VOMITING THAT IS NOT CONTROLLED WITH YOUR NAUSEA MEDICATION *UNUSUAL SHORTNESS OF BREATH *UNUSUAL BRUISING OR BLEEDING *URINARY PROBLEMS (pain or burning when urinating, or frequent urination) *BOWEL PROBLEMS (unusual diarrhea, constipation, pain near the anus) TENDERNESS IN MOUTH AND THROAT WITH OR WITHOUT PRESENCE OF ULCERS (sore throat, sores in mouth, or a toothache) UNUSUAL RASH, SWELLING OR PAIN  UNUSUAL VAGINAL DISCHARGE OR ITCHING   Items with * indicate a potential emergency and should be followed up as soon as possible or go to the Emergency Department if any problems should occur.  Please show the  CHEMOTHERAPY ALERT CARD or IMMUNOTHERAPY ALERT CARD at check-in to the Emergency Department and triage nurse.  Should you have questions after your visit or need to cancel or reschedule your appointment, please contact [facility]  Dept: [phone]  and follow the prompts.  Office hours are 8:00 a.m. to 4:30 p.m. Monday - Friday. Please note that voicemails left after 4:00 p.m. may not be returned until the following business day.  We are closed weekends and major holidays. You have access to a nurse at all times for urgent questions. Please call the main number to the clinic Dept: [phone] and follow the prompts.   For any non-urgent questions, you may also contact your provider using MyChart. We now offer e-Visits for anyone 66 and older to request care online for non-urgent symptoms. For details visit [email or web address].   Also download the MyChart app! Go to the app store, search "MyChart", open the app, select [facility], and log in with your MyChart username and password.

## 2023-02-10 NOTE — Progress Notes (Signed)
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 Liver biopsy 01/08/20 confirmed metastasis from primary colorectal cancer. PET scan showed hypermetabolism to known liver mets, diffuse thoracic and abdominal lymphadenopathy, a 1.8 cm LUL pulmonary nodule, and splenic flexure of colon. -treated with first line FOLFOX 01/25/20 - 10/02/20, Vectibix added with C2. Oxali discontinued after C18 due to reaction. -switched to Xeloda 10/21/20, dose adjusted due to significant skin toxicity -due to cancer progression on recent staging scan, treatment has been changed to FOLFIRI and bevacizumab on 03/25/22, she is overall tolerating well -will repeat CT scan after next cycle chemo  -Restaging CT scan from 06/16/2022 showed stable to mild decrease in size of treated liver metastasis. No new lesions -She is tolerating chemotherapy well overall, will continue -Her restaging CT scan on August 31, 2022 showed stable liver metastasis, and a stable 7 mm left lung nodule, indeterminate.  I personally reviewed the scan images with patient -Will continue current therapy.  We discussed the option of maintenance therapy with irinotecan and bevacizumab in future.  She is tolerating current FOLFIRI and bevacizumab well, will continue. -Restaging CT 12/17/22 showed stable disease, will continue chemo      Assessment and Plan    Metastatic Colon Cancer Follow-up for metastatic colon cancer. No new symptoms (pain, diarrhea, nausea). Tolerating chemotherapy well without significant side effects. Hemoglobin at 11.8, other counts within normal limits. Pending kidney and liver function tests, and tumor markers. Last tumor marker stable between 11-13. Last scan in October; next scan scheduled for January. Plans to travel for holidays; discussed scheduling flexibility to minimize stress. - Order next scan for January - Schedule next treatment for January 2nd - Proceed with today's chemotherapy infusion at 10:30 AM - Follow-up visit on December 18th with nurse practitioner Herbert Seta.      Plan -Lab reviewed, adequate for treatment, will proceed chemotherapy today and continue every 2 weeks -Ordered restaging CT chest, abdomen pelvis with contrast for mid January -Follow-up in 2 weeks   SUMMARY OF ONCOLOGIC HISTORY: Oncology History  metastatic colon cancer to liver  06/20/2019 Procedure   Colonoscopy by Dr Meridee Score 06/20/19  IMPRESSION -Seven 3 to 10 mm polyps in the sigmoid colon, in the transverse colon and in the escending colon, removed with a cold snare. Resected and retrireved.  -One 20mm polyp in the descending colon. Biopsies. Tattoes.  -Mediaum sized lipoma in the ascending colon.   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 See Comment B. Colon, Ascending, polyp, Polypectomy:  -TUBULAR ADENOMA -No high grade dysplasia or malignancy.  C. Colon, TRansverse, Polyo, polectomy:  -TUBULAR ADENOMA -No high grade dysplasia or malignancy.  D. Colon, Sigmoid, Polyp, Polypectomy:  -HYPERPLASTIC POLYP   11/07/2019 Imaging   US Abdomen 11/07/19  IMPRESSION: 1. Two solid masses in the liver are nonspecific. Recommend MRI abdomen with and without contrast for  further evaluation.   12/15/2019 Imaging   MRI  Abdomen 12/15/19  IMPRESSION: 1. There are two large masses in the liver with appearance favoring metastatic disease or hepatocellular carcinoma or cholangiocarcinoma. A benign etiology is highly unlikely given the enhancement pattern and associated adenopathy. 2. Considerable porta hepatis and retroperitoneal adenopathy. Some of the confluent porta hepatis tumor is potentially infiltrative and abuts the pancreatic body along its right upper margin, making it difficult to completely exclude the possibility of pancreatic adenocarcinoma primary. Possibilities helpful in further workup might include tissue diagnosis, endoscopic ultrasound, or nuclear medicine PET-CT. 3. Pancreas divisum. 4. Lumbar spondylosis and degenerative disc disease. 5. Despite efforts by the technologist and patient, motion artifact is present on today's exam and could not be eliminated. This reduces exam sensitivity and specificity.   12/22/2019 Procedure   Upper Endoscopy by Dr Elnoria Howard 12/22/19  IMPRESSION - One lymph node was visualized and measured in the porta hepatis region. Fine needle aspiration performed    12/22/2019 Initial Biopsy   A. LIVER, PORTA HEPATIS MASS, FINE NEEDLE 12/22/19 ASPIRATION:  FINAL MICROSCOPIC DIAGNOSIS:  - Malignant cells consistent with metastatic adenocarcinoma   01/01/2020 Initial Diagnosis   Intrahepatic cholangiocarcinoma (HCC)   01/08/2020 Initial Biopsy   FINAL MICROSCOPIC DIAGNOSIS:   A. LIVER, LEFT LOBE, BIOPSY:  - Metastatic adenocarcinoma, consistent with a colorectal primary.  See  comment      COMMENT:   Immunohistochemical stains show the tumor cells are positive for CK20  and CDX2 but negative for CK7, consistent with above interpretation.  Dr. Mosetta Putt was notified on 01/10/2020   01/08/2020 Genetic Testing   [name]  [name] wildtype and [name]/[name] mutations which make her eligible for target biological agent Vectibix.   01/24/2020 Procedure   PAC  placed 01/24/20   01/25/2020 -  Chemotherapy   first line FOLFOX starting 01/25/2020, Vextibix  added with C2 (02/06/20)   02/06/2020 - 02/06/2022 Chemotherapy   Patient is on Treatment Plan : COLORECTAL Panitumumab q14d ([name] Wild - Type Gene Only)     06/20/2020 Imaging   CT CAP  IMPRESSION: 1. Continued interval reduction in size and conspicuity of a subsolid nodule of the peripheral left upper lobe. 2. Unchanged prominent pretracheal and subcarinal lymph nodes. 3. Redemonstrated partially calcified low-attenuation liver masses, slightly decreased in size compared to prior examination. 4. Slight interval decrease in size of a portacaval lymph node or conglomerate and retroperitoneal lymph nodes. 5. Findings are consistent with continued treatment response of nodal, pulmonary, and hepatic metastatic disease. 6. Coronary artery disease.   Aortic Atherosclerosis (ICD10-I70.0).     10/09/2020 Imaging   IMPRESSION: 1. Slight decrease in size of dominant liver mass with smaller liver mass within 1-2 mm of prior measurement. 2. Stable appearance of celiac lymph and retroperitoneal lymph nodes. Dominant node with calcification in the gastrohepatic ligament as described. 3. Continued decrease in size of LEFT upper lobe nodule. 4. Three-vessel coronary artery calcification. 5. Aortic atherosclerosis.   03/25/2022 -  Chemotherapy   Patient is on Treatment Plan : COLORECTAL FOLFIRI + Bevacizumab q14d     08/31/2022 Imaging    IMPRESSION: 1. Unchanged, densely calcified liver lesions, consistent with treated metastatic disease. 2. Unchanged, irregular subpleural opacity of the peripheral left upper lobe. 3. Unchanged subcentimeter gastrohepatic ligament lymph node. 4. No evidence of new metastatic disease in the chest, abdomen, or pelvis. 5. Status post hysterectomy. 6. Coronary artery disease. 7. Aortic valve calcifications. Correlate for echocardiographic evidence of aortic  valve dysfunction.  Discussed the use of AI scribe software for clinical note transcription with the patient, who gave verbal consent to proceed.  History of Present Illness   The patient, a 67 year old with metastatic colon cancer, presents for a routine follow-up. She reports no new symptoms and is tolerating chemotherapy well, with no reported diarrhea or nausea. She denies any pain and has been eating more, with a stable weight of 82kg. Her energy levels are good and she is able to engage in desired activities. She denies any cough, pain, or swelling.         All other systems were reviewed with the patient and are negative.  MEDICAL HISTORY:  Past Medical History:  Diagnosis Date   Arthritis    Diabetes mellitus without complication (HCC)    Hypertension    met colon ca to liver 01/2020    SURGICAL HISTORY: Past Surgical History:  Procedure Laterality Date   ABDOMINAL HYSTERECTOMY     ANTERIOR AND POSTERIOR REPAIR WITH SACROSPINOUS FIXATION N/A 07/16/2016   Procedure: ANTERIOR AND POSTERIOR REPAIR WITH SACROSPINOUS FIXATION;  Surgeon: Harold Hedge, MD;  Location: [facility];  Service: Gynecology;  Laterality: N/A;   CYSTOSCOPY  07/16/2016   Procedure: CYSTOSCOPY;  Surgeon: Harold Hedge, MD;  Location: [facility];  Service: Gynecology;;   ESOPHAGOGASTRODUODENOSCOPY (EGD) WITH PROPOFOL N/A 12/22/2019   Procedure: ESOPHAGOGASTRODUODENOSCOPY (EGD) WITH PROPOFOL;  Surgeon: Jeani Hawking, MD;  Location: [facility];  Service: Endoscopy;  Laterality: N/A;   FINE NEEDLE ASPIRATION N/A 12/22/2019   Procedure: FINE NEEDLE ASPIRATION (FNA) LINEAR;  Surgeon: Jeani Hawking, MD;  Location: [facility];  Service: Endoscopy;  Laterality: N/A;   IR IMAGING GUIDED PORT INSERTION  01/24/2020   LAPAROSCOPIC VAGINAL HYSTERECTOMY WITH SALPINGECTOMY Bilateral 07/16/2016   Procedure: LAPAROSCOPIC ASSISTED VAGINAL HYSTERECTOMY WITH SALPINGECTOMY;  Surgeon: Harold Hedge, MD;  Location: [facility];   Service: Gynecology;  Laterality: Bilateral;   MYOMECTOMY ABDOMINAL APPROACH     THYROID SURGERY     tyroid     UPPER ESOPHAGEAL ENDOSCOPIC ULTRASOUND (EUS) N/A 12/22/2019   Procedure: UPPER ESOPHAGEAL ENDOSCOPIC ULTRASOUND (EUS);  Surgeon: Jeani Hawking, MD;  Location: [facility];  Service: Endoscopy;  Laterality: N/A;    I have reviewed the social history and family history with the patient and they are unchanged from previous note.  ALLERGIES:  is allergic to oxaliplatin.  MEDICATIONS:  Current Outpatient Medications  Medication Sig Dispense Refill   atorvastatin (LIPITOR) 20 MG tablet Take 20 mg by mouth at bedtime.   1   hydrochlorothiazide (MICROZIDE) 12.5 MG capsule Take 12.5 mg by mouth daily.     KLOR-CON M20 20 MEQ tablet TAKE 1 TABLET TWICE A DAY 180 tablet 3   lidocaine-prilocaine (EMLA) cream Apply 1 Application topically as needed. 30 g 1   magic mouthwash w/lidocaine SOLN Take 5 mLs by mouth 4 (four) times daily. 475 mL 2   ondansetron (ZOFRAN) 8 MG tablet Take 1 tablet (8 mg total) by mouth every 8 (eight) hours as needed. 20 tablet 0   prochlorperazine (COMPAZINE) 10 MG tablet Take 1 tablet (10 mg total) by mouth every 6 (six) hours as needed for nausea or vomiting. 30 tablet 2   spironolactone (ALDACTONE) 25 MG tablet Take 25 mg by mouth daily with breakfast.     No current facility-administered medications for this visit.    PHYSICAL EXAMINATION: ECOG PERFORMANCE STATUS: 1 - Symptomatic but completely ambulatory  Vitals:   02/10/23 1017  BP: 136/82  Pulse: 71  Resp: 18  Temp: 97.9 F (36.6 C)  SpO2: 97%   Wt Readings from Last 3 Encounters:  02/10/23 182 lb 11.2 oz (82.9 kg)  01/20/23 183 lb 8 oz (83.2 kg)  01/06/23 183 lb (83 kg)     GENERAL:alert, no distress and comfortable SKIN: skin color, texture, turgor are normal, no rashes or significant lesions EYES: normal, Conjunctiva are pink and non-injected, sclera clear NECK: supple, thyroid normal  size, non-tender, without nodularity LYMPH:  no palpable lymphadenopathy in the cervical, axillary  LUNGS: clear to auscultation and percussion with normal breathing effort HEART: regular rate & rhythm and no murmurs and no lower extremity edema ABDOMEN:abdomen soft, non-tender and normal bowel sounds Musculoskeletal:no cyanosis of digits and no clubbing  NEURO: alert & oriented x 3 with fluent speech, no focal motor/sensory deficits  LABORATORY DATA:  I have reviewed the data as listed    Latest Ref Rng & Units 02/10/2023    9:34 AM 01/20/2023    9:46 AM 01/06/2023    8:34 AM  CBC  WBC 4.0 - 10.5 K/uL 7.5  4.2  11.6   Hemoglobin 12.0 - 15.0 g/dL 16.1  09.6  04.5   Hematocrit 36.0 - 46.0 % 37.4  35.9  37.1   Platelets 150 - 400 K/uL 273  306  292         Latest Ref Rng & Units 02/10/2023    9:34 AM 01/20/2023    9:46 AM 01/06/2023    8:34 AM  CMP  Glucose 70 - 99 mg/dL 409  811  914   BUN 8 - 23 mg/dL 11  9  14    Creatinine 0.44 - 1.00 mg/dL 7.82  9.56  2.13   Sodium 135 - 145 mmol/L 137  139  139   Potassium 3.5 - 5.1 mmol/L 3.7  3.7  3.8   Chloride 98 - 111 mmol/L 105  107  105   CO2 22 - 32 mmol/L 28  28  27    Calcium 8.9 - 10.3 mg/dL 9.4  9.2  9.3   Total Protein 6.5 - 8.1 g/dL 7.0  7.2  7.3   Total Bilirubin <1.2 mg/dL 0.3  0.3  0.2   Alkaline Phos 38 - 126 U/L 80  81  113   AST 15 - 41 U/L 15  11  12    ALT 0 - 44 U/L 14  9  12        RADIOGRAPHIC STUDIES: I have personally reviewed the radiological images as listed and agreed with the findings in the report. No results found.    Orders Placed This Encounter  Procedures   CT CHEST ABDOMEN PELVIS W CONTRAST    Standing Status:   Future    Standing Expiration Date:   02/10/2024    Order Specific Question:   If indicated for the ordered procedure, I authorize the administration of contrast media per Radiology protocol    Answer:   Yes    Order Specific Question:   Does the patient have a contrast media/X-ray  dye allergy?    Answer:   Yes    Order Specific Question:   Preferred imaging location?    Answer:   [facility]    Order Specific Question:   If indicated for the ordered procedure, I authorize the administration of oral contrast media per Radiology protocol    Answer:   Yes   All questions were answered. The patient knows to call the clinic with any problems, questions  or concerns. No barriers to learning was detected. The total time spent in the appointment was 25 minutes.     Malachy Mood, MD 02/10/2023

## 2023-02-10 NOTE — Assessment & Plan Note (Signed)
MSS, [name]/[name] wildtype -diagnosed 12/2019 by porta hepatis LN biopsy during EUS for work up of abdominal pain and liver lesions seen on [place] and MRI. Liver biopsy 01/08/20 confirmed metastasis from primary colorectal cancer. PET scan showed hypermetabolism to known liver mets, diffuse thoracic and abdominal lymphadenopathy, a 1.8 cm LUL pulmonary nodule, and splenic flexure of colon. -treated with first line FOLFOX 01/25/20 - 10/02/20, Vectibix added with C2. Oxali discontinued after C18 due to reaction. -switched to Xeloda 10/21/20, dose adjusted due to significant skin toxicity -due to cancer progression on recent staging scan, treatment has been changed to FOLFIRI and bevacizumab on 03/25/22, she is overall tolerating well -will repeat CT scan after next cycle chemo  -Restaging CT scan from 06/16/2022 showed stable to mild decrease in size of treated liver metastasis. No new lesions -She is tolerating chemotherapy well overall, will continue -Her restaging CT scan on August 31, 2022 showed stable liver metastasis, and a stable 7 mm left lung nodule, indeterminate.  I personally reviewed the scan images with patient -Will continue current therapy.  We discussed the option of maintenance therapy with irinotecan and bevacizumab in future.  She is tolerating current FOLFIRI and bevacizumab well, will continue. -Restaging CT 12/17/22 showed stable disease, will continue chemo

## 2023-02-12 ENCOUNTER — Inpatient Hospital Stay: Payer: Medicare (Managed Care)

## 2023-02-12 ENCOUNTER — Telehealth: Payer: Self-pay

## 2023-02-12 VITALS — BP 115/67 | HR 67

## 2023-02-12 DIAGNOSIS — Z5112 Encounter for antineoplastic immunotherapy: Secondary | ICD-10-CM | POA: Diagnosis not present

## 2023-02-12 DIAGNOSIS — C221 Intrahepatic bile duct carcinoma: Secondary | ICD-10-CM

## 2023-02-12 DIAGNOSIS — Z95828 Presence of other vascular implants and grafts: Secondary | ICD-10-CM

## 2023-02-12 MED ORDER — HEPARIN SOD (PORK) LOCK FLUSH 100 UNIT/ML IV SOLN
500.0000 [IU] | Freq: Once | INTRAVENOUS | Status: AC
  Administered 2023-02-12: 500 [IU]

## 2023-02-12 MED ORDER — SODIUM CHLORIDE 0.9% FLUSH
10.0000 mL | Freq: Once | INTRAVENOUS | Status: AC
  Administered 2023-02-12: 10 mL

## 2023-02-12 MED ORDER — PEGFILGRASTIM INJECTION 6 MG/0.6ML ~~LOC~~
6.0000 mg | PREFILLED_SYRINGE | Freq: Once | SUBCUTANEOUS | Status: AC
  Administered 2023-02-12: 6 mg via SUBCUTANEOUS
  Filled 2023-02-12: qty 0.6

## 2023-02-12 NOTE — Telephone Encounter (Signed)
-----   Message from Pollyann Samples sent at 02/11/2023  1:56 PM EST ----- Please let pt know CEA. Dr. Mosetta Putt has ordered CT in the next few weeks.   Thanks Lacie NP

## 2023-02-12 NOTE — Telephone Encounter (Signed)
Pt advised of lab results with VU. She has agreed to proceed with CT scan to be scheduled after ins approval.

## 2023-02-23 ENCOUNTER — Other Ambulatory Visit: Payer: Self-pay | Admitting: Nurse Practitioner

## 2023-02-23 NOTE — Assessment & Plan Note (Signed)
MSS, [name]/[name] wildtype -diagnosed 12/2019 by porta hepatis LN biopsy during EUS for work up of abdominal pain and liver lesions seen on [place] and MRI. Liver biopsy 01/08/20 confirmed metastasis from primary colorectal cancer. PET scan showed hypermetabolism to known liver mets, diffuse thoracic and abdominal lymphadenopathy, a 1.8 cm LUL pulmonary nodule, and splenic flexure of colon. -treated with first line FOLFOX 01/25/20 - 10/02/20, Vectibix added with C2. Oxali discontinued after C18 due to reaction. -switched to Xeloda 10/21/20, dose adjusted due to significant skin toxicity -due to cancer progression on recent staging scan, treatment has been changed to FOLFIRI and bevacizumab on 03/25/22, she is overall tolerating well -will repeat CT scan after next cycle chemo  -Restaging CT scan from 06/16/2022 showed stable to mild decrease in size of treated liver metastasis. No new lesions -She is tolerating chemotherapy well overall, will continue -Her restaging CT scan on August 31, 2022 showed stable liver metastasis, and a stable 7 mm left lung nodule, indeterminate.  I personally reviewed the scan images with patient -Will continue current therapy.  We discussed the option of maintenance therapy with irinotecan and bevacizumab in future.  She is tolerating current FOLFIRI and bevacizumab well, will continue. -Restaging CT 12/17/22 showed stable disease, will continue chemo  -02/24/2023 -today is cycle 23 day 1

## 2023-02-23 NOTE — Progress Notes (Signed)
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 Liver biopsy 01/08/20 confirmed metastasis from primary colorectal cancer. PET scan showed hypermetabolism to known liver mets, diffuse thoracic and abdominal lymphadenopathy, a 1.8 cm LUL pulmonary nodule, and splenic flexure of colon. -treated with first line FOLFOX 01/25/20 - 10/02/20, Vectibix added with C2. Oxali discontinued after C18 due to reaction. -switched to Xeloda 10/21/20, dose adjusted due to significant skin toxicity -due to cancer progression on recent staging scan, treatment has been changed to FOLFIRI and bevacizumab on 03/25/22, she is overall tolerating well -will repeat CT scan after next cycle chemo  -Restaging CT scan from 06/16/2022 showed stable to mild decrease in size of treated liver metastasis. No new lesions -She is tolerating chemotherapy well overall, will continue -Her restaging CT scan on August 31, 2022 showed stable liver metastasis, and a stable 7 mm left lung nodule, indeterminate.  I personally reviewed the scan images with patient -Will continue current therapy.  We discussed the option of maintenance therapy with irinotecan and bevacizumab in future.  She is tolerating current FOLFIRI and bevacizumab well, will continue. -Restaging CT 12/17/22 showed stable disease, will continue chemo  -02/24/2023 -today is cycle 23 day 1   Plan: Labs reviewed  -CBC showing WBC 8.4; Hgb 12.3; Hct 38.7; Plt 256; Anc 6.4 -CMP - K 3.7; glucose 122;  BUN 10; Creatinine 0.67; eGFR > 60; Ca 9.6; LFTs normal.   -Urine is negative for protein Patient condition and labs are stable and adequate to proceed with treatment.  Today is cycle 23 day 1 Return for labs, follow-up, and treatment as scheduled in 2 weeks  The patient understands the plans discussed today and is in agreement with them.  She knows to contact our office if she develops concerns prior to her next appointment.  I provided 25 minutes of face-to-face time during this encounter and > 50% was spent counseling as documented under my assessment and plan.    Carlean Jews, NP   [facility] - A DEPT OF [facility] [address] [place] [address] Dept: [phone] Dept Fax: [phone]   No orders of the defined types were placed in this encounter.     CHIEF COMPLAINT:  CC: Metastatic colon cancer to liver  Current Treatment: FOLFIRI and bevacizumab every 14 days  INTERVAL HISTORY:  Lindsay [name] is here today for repeat clinical assessment.  Last saw Dr. Mosetta Putt on 02/10/2023.  Has been tolerating chemo well overall.  Most recent CT scan showed stable disease.  Next scan to be scheduled for January 2025.  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  This is unchanged and not getting worse.  She states she would like to begin low impact exercise. She denies chest pain, chest pressure, or shortness of breath. She denies headaches or visual disturbances. She denies abdominal pain, nausea, vomiting, or changes in bowel or bladder habits.   She denies fevers or chills. She denies pain. Her appetite is good. Her weight  has been stable.  I have reviewed the past medical history, past surgical history, social history and family history with the patient and they are unchanged from previous note.  ALLERGIES:  is allergic to oxaliplatin.  MEDICATIONS:  Current Outpatient Medications  Medication Sig Dispense Refill    atorvastatin (LIPITOR) 20 MG tablet Take 20 mg by mouth at bedtime.   1   hydrochlorothiazide (MICROZIDE) 12.5 MG capsule Take 12.5 mg by mouth daily.     KLOR-CON M20 20 MEQ tablet TAKE 1 TABLET TWICE A DAY 180 tablet 3   lidocaine-prilocaine (EMLA) cream Apply 1 Application topically as needed. 30 g 1   magic mouthwash w/lidocaine SOLN Take 5 mLs by mouth 4 (four) times daily. 475 mL 2   ondansetron (ZOFRAN) 8 MG tablet Take 1 tablet (8 mg total) by mouth every 8 (eight) hours as needed. 20 tablet 0   prochlorperazine (COMPAZINE) 10 MG tablet Take 1 tablet (10 mg total) by mouth every 6 (six) hours as needed for nausea or vomiting. 30 tablet 2   spironolactone (ALDACTONE) 25 MG tablet Take 25 mg by mouth daily with breakfast.     No current facility-administered medications for this visit.    HISTORY OF PRESENT ILLNESS:   Oncology History  metastatic colon cancer to liver  06/20/2019 Procedure   Colonoscopy by Dr Meridee Score 06/20/19  IMPRESSION -Seven 3 to 10 mm polyps in the sigmoid colon, in the transverse colon and in the escending colon, removed with a cold snare. Resected and retrireved.  -One 20mm polyp in the descending colon. Biopsies. Tattoes.  -Mediaum sized lipoma in the ascending colon.   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 See Comment B. Colon, Ascending, polyp, Polypectomy:  -TUBULAR ADENOMA -No high grade dysplasia or malignancy.  C. Colon, TRansverse, Polyo, polectomy:  -TUBULAR ADENOMA -No high grade dysplasia or malignancy.  D. Colon, Sigmoid, Polyp, Polypectomy:  -HYPERPLASTIC POLYP   11/07/2019 Imaging   US Abdomen 11/07/19  IMPRESSION: 1. Two solid masses in the liver are nonspecific. Recommend MRI abdomen with and without contrast for further evaluation.   12/15/2019 Imaging   MRI Abdomen 12/15/19  IMPRESSION: 1. There are two large masses in the liver with appearance  favoring metastatic disease or hepatocellular carcinoma or cholangiocarcinoma. A benign etiology is highly unlikely given the enhancement pattern and associated adenopathy. 2. Considerable porta hepatis and retroperitoneal adenopathy. Some of the confluent porta hepatis tumor is potentially infiltrative and abuts the pancreatic body along its right upper margin, making it difficult to completely exclude the possibility of pancreatic adenocarcinoma primary. Possibilities helpful in further workup might include tissue diagnosis, endoscopic ultrasound, or nuclear medicine PET-CT. 3. Pancreas divisum. 4. Lumbar spondylosis and degenerative disc disease. 5. Despite efforts by the technologist and patient, motion artifact is present on today's exam and could not be eliminated. This reduces exam sensitivity and specificity.   12/22/2019 Procedure   Upper Endoscopy by Dr Elnoria Howard 12/22/19  IMPRESSION - One lymph node was visualized and measured in the porta hepatis region. Fine needle aspiration performed    12/22/2019 Initial Biopsy   A. LIVER, PORTA HEPATIS MASS, FINE NEEDLE 12/22/19 ASPIRATION:  FINAL MICROSCOPIC DIAGNOSIS:  - Malignant cells consistent with metastatic adenocarcinoma   01/01/2020 Initial Diagnosis   Intrahepatic cholangiocarcinoma (HCC)   01/08/2020 Initial Biopsy   FINAL MICROSCOPIC DIAGNOSIS:   A. LIVER, LEFT LOBE, BIOPSY:  - Metastatic adenocarcinoma, consistent with a colorectal primary.  See  comment      COMMENT:   Immunohistochemical stains show the tumor cells are positive for CK20  and CDX2 but negative for CK7, consistent with above interpretation.  Dr. Mosetta Putt was notified on 01/10/2020   01/08/2020 Genetic Testing   [name]  [name] wildtype and [name]/[name] mutations which make her eligible for target biological agent Vectibix.   01/24/2020 Procedure   PAC placed 01/24/20   01/25/2020 -  Chemotherapy   first line FOLFOX starting 01/25/2020,  Vextibix  added with C2 (02/06/20)   02/06/2020 - 02/06/2022 Chemotherapy   Patient is on Treatment Plan : COLORECTAL Panitumumab q14d ([name] Wild - Type Gene Only)     06/20/2020 Imaging   CT CAP  IMPRESSION: 1. Continued interval reduction in size and conspicuity of a subsolid nodule of the peripheral left upper lobe. 2. Unchanged prominent pretracheal and subcarinal lymph nodes. 3. Redemonstrated partially calcified low-attenuation liver masses, slightly decreased in size compared to prior examination. 4. Slight interval decrease in size of a portacaval lymph node or conglomerate and retroperitoneal lymph nodes. 5. Findings are consistent with continued treatment response of nodal, pulmonary, and hepatic metastatic disease. 6. Coronary artery disease.   Aortic Atherosclerosis (ICD10-I70.0).     10/09/2020 Imaging   IMPRESSION: 1. Slight decrease in size of dominant liver mass with smaller liver mass within 1-2 mm of prior measurement. 2. Stable appearance of celiac lymph and retroperitoneal lymph nodes. Dominant node with calcification in the gastrohepatic ligament as described. 3. Continued decrease in size of LEFT upper lobe nodule. 4. Three-vessel coronary artery calcification. 5. Aortic atherosclerosis.   03/25/2022 -  Chemotherapy   Patient is on Treatment Plan : COLORECTAL FOLFIRI + Bevacizumab q14d     08/31/2022 Imaging    IMPRESSION: 1. Unchanged, densely calcified liver lesions, consistent with treated metastatic disease. 2. Unchanged, irregular subpleural opacity of the peripheral left upper lobe. 3. Unchanged subcentimeter gastrohepatic ligament lymph node. 4. No evidence of new metastatic disease in the chest, abdomen, or pelvis. 5. Status post hysterectomy. 6. Coronary artery disease. 7. Aortic valve calcifications. Correlate for echocardiographic evidence of aortic valve dysfunction.         REVIEW OF SYSTEMS:   Constitutional: Denies fevers, chills  or abnormal weight loss Eyes: Denies blurriness of vision Ears, nose, mouth, throat, and face: Denies mucositis or sore throat Respiratory: Denies cough, dyspnea or wheezes Cardiovascular: Denies palpitation, chest discomfort or lower extremity swelling Gastrointestinal:  Denies nausea, heartburn or change in bowel habits Skin: Denies abnormal skin rashes Lymphatics: Denies new lymphadenopathy or easy bruising Neurological:Denies numbness, tingling or new weaknesses Behavioral/Psych: Mood is stable, no new changes  All other systems were reviewed with the patient and are negative.   VITALS:   Today's Vitals   02/24/23 1057 02/24/23 1102  BP: (!) 127/91   Pulse: 65   Resp: 18   Temp: 97.8 F (36.6 C)   TempSrc: Tympanic   SpO2: 100%   Weight: 182 lb 1.6 oz (82.6 kg)   Height: 5\' 6"  (1.676 m)   PainSc:  0-No pain   Body mass index is 29.39 kg/m.  Wt Readings from Last 3 Encounters:  02/24/23 182 lb 1.6 oz (82.6 kg)  02/10/23 182 lb 11.2 oz (82.9 kg)  01/20/23 183 lb 8 oz (83.2 kg)    Body mass index is 29.39 kg/m.  Performance status (ECOG): 1 - Symptomatic but completely ambulatory  PHYSICAL EXAM:   GENERAL:alert, no distress and comfortable SKIN: skin color, texture,  turgor are normal, no rashes or significant lesions EYES: normal, Conjunctiva are pink and non-injected, sclera clear OROPHARYNX:no exudate, no erythema and lips, buccal mucosa, and tongue normal  NECK: supple, thyroid normal size, non-tender, without nodularity LYMPH:  no palpable lymphadenopathy in the cervical, axillary or inguinal LUNGS: clear to auscultation and percussion with normal breathing effort HEART: regular rate & rhythm and no murmurs and no lower extremity edema ABDOMEN:abdomen soft, non-tender and normal bowel sounds Musculoskeletal:no cyanosis of digits and no clubbing  NEURO: alert & oriented x 3 with fluent speech, no focal motor/sensory deficits  LABORATORY DATA:  I have  reviewed the data as listed    Component Value Date/Time   NA 138 02/24/2023 0958   K 3.7 02/24/2023 0958   CL 103 02/24/2023 0958   CO2 29 02/24/2023 0958   GLUCOSE 122 (H) 02/24/2023 0958   BUN 10 02/24/2023 0958   CREATININE 0.67 02/24/2023 0958   CALCIUM 9.6 02/24/2023 0958   PROT 7.3 02/24/2023 0958   ALBUMIN 4.2 02/24/2023 0958   AST 16 02/24/2023 0958   ALT 18 02/24/2023 0958   ALKPHOS 120 02/24/2023 0958   BILITOT 0.3 02/24/2023 0958   GFRNONAA >60 02/24/2023 0958   GFRAA >60 07/09/2016 0836     Lab Results  Component Value Date   WBC 8.4 02/24/2023   NEUTROABS 6.4 02/24/2023   HGB 12.3 02/24/2023   HCT 38.7 02/24/2023   MCV 89.0 02/24/2023   PLT 256 02/24/2023

## 2023-02-24 ENCOUNTER — Inpatient Hospital Stay (HOSPITAL_BASED_OUTPATIENT_CLINIC_OR_DEPARTMENT_OTHER): Payer: Medicare (Managed Care) | Admitting: Nurse Practitioner

## 2023-02-24 ENCOUNTER — Inpatient Hospital Stay: Payer: Medicare (Managed Care)

## 2023-02-24 ENCOUNTER — Other Ambulatory Visit: Payer: Self-pay | Admitting: Hematology

## 2023-02-24 VITALS — BP 127/91 | HR 65 | Temp 97.8°F | Resp 18 | Ht 66.0 in | Wt 182.1 lb

## 2023-02-24 DIAGNOSIS — Z5112 Encounter for antineoplastic immunotherapy: Secondary | ICD-10-CM | POA: Diagnosis not present

## 2023-02-24 DIAGNOSIS — C221 Intrahepatic bile duct carcinoma: Secondary | ICD-10-CM

## 2023-02-24 DIAGNOSIS — Z95828 Presence of other vascular implants and grafts: Secondary | ICD-10-CM

## 2023-02-24 LAB — CMP (CANCER CENTER ONLY)
ALT: 18 U/L (ref 0–44)
AST: 16 U/L (ref 15–41)
Albumin: 4.2 g/dL (ref 3.5–5.0)
Alkaline Phosphatase: 120 U/L (ref 38–126)
Anion gap: 6 (ref 5–15)
BUN: 10 mg/dL (ref 8–23)
CO2: 29 mmol/L (ref 22–32)
Calcium: 9.6 mg/dL (ref 8.9–10.3)
Chloride: 103 mmol/L (ref 98–111)
Creatinine: 0.67 mg/dL (ref 0.44–1.00)
GFR, Estimated: >60 mL/min (ref >60)
Glucose, Bld: 122 mg/dL — ABNORMAL HIGH (ref 70–99)
Potassium: 3.7 mmol/L (ref 3.5–5.1)
Sodium: 138 mmol/L (ref 135–145)
Total Bilirubin: 0.3 mg/dL (ref <1.2)
Total Protein: 7.3 g/dL (ref 6.5–8.1)

## 2023-02-24 LAB — CBC WITH DIFFERENTIAL (CANCER CENTER ONLY)
Abs Immature Granulocytes: 0.04 10*3/uL (ref 0.00–0.07)
Basophils Absolute: 0 10*3/uL (ref 0.0–0.1)
Basophils Relative: 1 %
Eosinophils Absolute: 0.1 10*3/uL (ref 0.0–0.5)
Eosinophils Relative: 1 %
HCT: 38.7 % (ref 36.0–46.0)
Hemoglobin: 12.3 g/dL (ref 12.0–15.0)
Immature Granulocytes: 1 %
Lymphocytes Relative: 15 %
Lymphs Abs: 1.3 10*3/uL (ref 0.7–4.0)
MCH: 28.3 pg (ref 26.0–34.0)
MCHC: 31.8 g/dL (ref 30.0–36.0)
MCV: 89 fL (ref 80.0–100.0)
Monocytes Absolute: 0.5 10*3/uL (ref 0.1–1.0)
Monocytes Relative: 6 %
Neutro Abs: 6.4 10*3/uL (ref 1.7–7.7)
Neutrophils Relative %: 76 %
Platelet Count: 256 10*3/uL (ref 150–400)
RBC: 4.35 MIL/uL (ref 3.87–5.11)
RDW: 17.5 % — ABNORMAL HIGH (ref 11.5–15.5)
WBC Count: 8.4 10*3/uL (ref 4.0–10.5)
nRBC: 0 % (ref 0.0–0.2)

## 2023-02-24 LAB — TOTAL PROTEIN, URINE DIPSTICK: Protein, ur: NEGATIVE mg/dL

## 2023-02-24 MED ORDER — DEXAMETHASONE SODIUM PHOSPHATE 10 MG/ML IJ SOLN
10.0000 mg | Freq: Once | INTRAMUSCULAR | Status: AC
  Administered 2023-02-24: 10 mg via INTRAVENOUS
  Filled 2023-02-24: qty 1

## 2023-02-24 MED ORDER — SODIUM CHLORIDE 0.9 % IV SOLN: Freq: Once | INTRAVENOUS | Status: AC

## 2023-02-24 MED ORDER — SODIUM CHLORIDE 0.9% FLUSH
10.0000 mL | Freq: Once | INTRAVENOUS | Status: AC
  Administered 2023-02-24: 10 mL

## 2023-02-24 MED ORDER — ATROPINE SULFATE 1 MG/ML IV SOLN
0.5000 mg | Freq: Once | INTRAVENOUS | Status: AC
  Administered 2023-02-24: 0.5 mg via INTRAVENOUS
  Filled 2023-02-24: qty 1

## 2023-02-24 MED ORDER — BEVACIZUMAB-AWWB CHEMO INJECTION 400 MG/16ML
5.0000 mg/kg | Freq: Once | INTRAVENOUS | Status: AC
  Administered 2023-02-24: 400 mg via INTRAVENOUS
  Filled 2023-02-24: qty 16

## 2023-02-24 MED ORDER — LEUCOVORIN CALCIUM INJECTION 350 MG
400.0000 mg/m2 | Freq: Once | INTRAMUSCULAR | Status: AC
  Administered 2023-02-24: 780 mg via INTRAVENOUS
  Filled 2023-02-24: qty 25

## 2023-02-24 MED ORDER — SODIUM CHLORIDE 0.9 % IV SOLN
2400.0000 mg/m2 | INTRAVENOUS | Status: DC
  Administered 2023-02-24: 5000 mg via INTRAVENOUS
  Filled 2023-02-24: qty 100

## 2023-02-24 MED ORDER — PALONOSETRON HCL INJECTION 0.25 MG/5ML
0.2500 mg | Freq: Once | INTRAVENOUS | Status: AC
  Administered 2023-02-24: 0.25 mg via INTRAVENOUS
  Filled 2023-02-24: qty 5

## 2023-02-24 MED ORDER — SODIUM CHLORIDE 0.9 % IV SOLN
180.0000 mg/m2 | Freq: Once | INTRAVENOUS | Status: AC
  Administered 2023-02-24: 360 mg via INTRAVENOUS
  Filled 2023-02-24: qty 15

## 2023-02-24 NOTE — Patient Instructions (Signed)
 [facility] - A DEPT OF [facility]  Discharge Instructions: Thank you for choosing [facility] to provide your oncology and hematology care.   If you have a lab appointment with the Cancer Center, please go directly to the Cancer Center and check in at the registration area.   Wear comfortable clothing and clothing appropriate for easy access to any Portacath or PICC line.   We strive to give you quality time with your provider. You may need to reschedule your appointment if you arrive late (15 or more minutes).  Arriving late affects you and other patients whose appointments are after yours.  Also, if you miss three or more appointments without notifying the office, you may be dismissed from the clinic at the provider's discretion.      For prescription refill requests, have your pharmacy contact our office and allow 72 hours for refills to be completed.    Today you received the following chemotherapy and/or immunotherapy agents: Bevacizumab, Irinotecan, & Fluorouracil      To help prevent nausea and vomiting after your treatment, we encourage you to take your nausea medication as directed.  BELOW ARE SYMPTOMS THAT SHOULD BE REPORTED IMMEDIATELY: *FEVER GREATER THAN 100.4 F (38 C) OR HIGHER *CHILLS OR SWEATING *NAUSEA AND VOMITING THAT IS NOT CONTROLLED WITH YOUR NAUSEA MEDICATION *UNUSUAL SHORTNESS OF BREATH *UNUSUAL BRUISING OR BLEEDING *URINARY PROBLEMS (pain or burning when urinating, or frequent urination) *BOWEL PROBLEMS (unusual diarrhea, constipation, pain near the anus) TENDERNESS IN MOUTH AND THROAT WITH OR WITHOUT PRESENCE OF ULCERS (sore throat, sores in mouth, or a toothache) UNUSUAL RASH, SWELLING OR PAIN  UNUSUAL VAGINAL DISCHARGE OR ITCHING   Items with * indicate a potential emergency and should be followed up as soon as possible or go to the Emergency Department if any problems should occur.  Please show the  CHEMOTHERAPY ALERT CARD or IMMUNOTHERAPY ALERT CARD at check-in to the Emergency Department and triage nurse.  Should you have questions after your visit or need to cancel or reschedule your appointment, please contact [facility] - [facility]  Dept: [phone]  and follow the prompts.  Office hours are 8:00 a.m. to 4:30 p.m. Monday - Friday. Please note that voicemails left after 4:00 p.m. may not be returned until the following business day.  We are closed weekends and major holidays. You have access to a nurse at all times for urgent questions. Please call the main number to the clinic Dept: [phone] and follow the prompts.   For any non-urgent questions, you may also contact your provider using MyChart. We now offer e-Visits for anyone 66 and older to request care online for non-urgent symptoms. For details visit [email or web address].   Also download the MyChart app! Go to the app store, search "MyChart", open the app, select [facility], and log in with your MyChart username and password.

## 2023-02-26 ENCOUNTER — Other Ambulatory Visit: Payer: Self-pay

## 2023-02-26 ENCOUNTER — Inpatient Hospital Stay: Payer: Medicare (Managed Care)

## 2023-02-26 VITALS — BP 109/72 | HR 88 | Temp 98.3°F | Resp 17

## 2023-02-26 DIAGNOSIS — Z95828 Presence of other vascular implants and grafts: Secondary | ICD-10-CM

## 2023-02-26 DIAGNOSIS — Z5112 Encounter for antineoplastic immunotherapy: Secondary | ICD-10-CM | POA: Diagnosis not present

## 2023-02-26 DIAGNOSIS — C221 Intrahepatic bile duct carcinoma: Secondary | ICD-10-CM

## 2023-02-26 MED ORDER — PEGFILGRASTIM INJECTION 6 MG/0.6ML ~~LOC~~
6.0000 mg | PREFILLED_SYRINGE | Freq: Once | SUBCUTANEOUS | Status: AC
Start: 2023-02-26 — End: 2023-02-26
  Administered 2023-02-26: 6 mg via SUBCUTANEOUS
  Filled 2023-02-26: qty 0.6

## 2023-02-26 MED ORDER — HEPARIN SOD (PORK) LOCK FLUSH 100 UNIT/ML IV SOLN
500.0000 [IU] | Freq: Once | INTRAVENOUS | Status: AC
  Administered 2023-02-26: 500 [IU]

## 2023-02-26 MED ORDER — SODIUM CHLORIDE 0.9% FLUSH
10.0000 mL | Freq: Once | INTRAVENOUS | Status: AC
  Administered 2023-02-26: 10 mL

## 2023-02-28 ENCOUNTER — Encounter: Payer: Self-pay | Admitting: Nurse Practitioner

## 2023-02-28 ENCOUNTER — Encounter: Payer: Self-pay | Admitting: Hematology

## 2023-03-08 NOTE — Progress Notes (Signed)
 Patient Care Team: [name], MD as PCP - General (Internal Medicine) Lenon Channing CROME, RN (Inactive) as Oncology Nurse Navigator [name], MD as Consulting Physician (Oncology) [name], MD as Consulting Physician (Gastroenterology)   CHIEF COMPLAINT: Follow up metastatic colon cancer   Oncology History  metastatic colon cancer to liver  06/20/2019 Procedure   Colonoscopy by Dr Wilhelmenia 06/20/19  IMPRESSION -Seven 3 to 10 mm polyps in the sigmoid colon, in the transverse colon and in the escending colon, removed with a cold snare. Resected and retrireved.  -One 20mm polyp in the descending colon. Biopsies. Tattoes.  -Mediaum sized lipoma in the ascending colon.   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 See Comment B. Colon, Ascending, polyp, Polypectomy:  -TUBULAR ADENOMA -No high grade dysplasia or malignancy.  C. Colon, TRansverse, Polyo, polectomy:  -TUBULAR ADENOMA -No high grade dysplasia or malignancy.  D. Colon, Sigmoid, Polyp, Polypectomy:  -HYPERPLASTIC POLYP   11/07/2019 Imaging   US  Abdomen 11/07/19  IMPRESSION: 1. Two solid masses in the liver are nonspecific. Recommend MRI abdomen with and without contrast for further evaluation.   12/15/2019 Imaging   MRI Abdomen 12/15/19  IMPRESSION: 1. There are two large masses in the liver with appearance favoring metastatic disease or hepatocellular carcinoma or cholangiocarcinoma. A benign etiology is highly unlikely given the enhancement pattern and associated adenopathy. 2. Considerable porta hepatis and retroperitoneal adenopathy. Some of the confluent porta hepatis tumor is potentially infiltrative and abuts the pancreatic body along its right upper margin, making it difficult to completely exclude the possibility of pancreatic adenocarcinoma primary. Possibilities helpful in further workup might include tissue  diagnosis, endoscopic ultrasound, or nuclear medicine PET-CT. 3. Pancreas divisum. 4. Lumbar spondylosis and degenerative disc disease. 5. Despite efforts by the technologist and patient, motion artifact is present on today's exam and could not be eliminated. This reduces exam sensitivity and specificity.   12/22/2019 Procedure   Upper Endoscopy by Dr Rollin 12/22/19  IMPRESSION - One lymph node was visualized and measured in the porta hepatis region. Fine needle aspiration performed    12/22/2019 Initial Biopsy   A. LIVER, PORTA HEPATIS MASS, FINE NEEDLE 12/22/19 ASPIRATION:  FINAL MICROSCOPIC DIAGNOSIS:  - Malignant cells consistent with metastatic adenocarcinoma   01/01/2020 Initial Diagnosis   Intrahepatic cholangiocarcinoma (HCC)   01/08/2020 Initial Biopsy   FINAL MICROSCOPIC DIAGNOSIS:   A. LIVER, LEFT LOBE, BIOPSY:  - Metastatic adenocarcinoma, consistent with a colorectal primary.  See  comment      COMMENT:   Immunohistochemical stains show the tumor cells are positive for CK20  and CDX2 but negative for CK7, consistent with above interpretation.  Dr. 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   01/24/2020 Procedure   PAC placed 01/24/20   01/25/2020 -  Chemotherapy   first line FOLFOX starting 01/25/2020, Vextibix  added with C2 (02/06/20)   02/06/2020 - 02/06/2022 Chemotherapy   Patient is on Treatment Plan : COLORECTAL Panitumumab  q14d ([name] - Type Gene Only)     06/20/2020 Imaging   CT CAP  IMPRESSION: 1. Continued interval reduction in size and conspicuity of a subsolid nodule of the peripheral left upper lobe. 2. Unchanged prominent pretracheal and subcarinal lymph nodes. 3. Redemonstrated partially calcified low-attenuation liver masses, slightly decreased in size compared to prior examination.  4. Slight  interval decrease in size of a portacaval lymph node or conglomerate and retroperitoneal lymph nodes. 5. Findings are consistent with continued treatment response of nodal, pulmonary, and hepatic metastatic disease. 6. Coronary artery disease.   Aortic Atherosclerosis (ICD10-I70.0).     10/09/2020 Imaging   IMPRESSION: 1. Slight decrease in size of dominant liver mass with smaller liver mass within 1-2 mm of prior measurement. 2. Stable appearance of celiac lymph and retroperitoneal lymph nodes. Dominant node with calcification in the gastrohepatic ligament as described. 3. Continued decrease in size of LEFT upper lobe nodule. 4. Three-vessel coronary artery calcification. 5. Aortic atherosclerosis.   03/25/2022 -  Chemotherapy   Patient is on Treatment Plan : COLORECTAL FOLFIRI + Bevacizumab  q14d     08/31/2022 Imaging    IMPRESSION: 1. Unchanged, densely calcified liver lesions, consistent with treated metastatic disease. 2. Unchanged, irregular subpleural opacity of the peripheral left upper lobe. 3. Unchanged subcentimeter gastrohepatic ligament lymph node. 4. No evidence of new metastatic disease in the chest, abdomen, or pelvis. 5. Status post hysterectomy. 6. Coronary artery disease. 7. Aortic valve calcifications. Correlate for echocardiographic evidence of aortic valve dysfunction.        CURRENT THERAPY: FOLFIRI/Beva q14 days, starting 03/25/22   INTERVAL HISTORY Ms. Wahid returns for follow up and treatment as scheduled. Last seen 02/24/23 with C23.  She tolerates treatment well, with some constipation with 5-FU pump and periodic trapped gas in the esophagus which only bothers her at night when she lies down.  Mylanta helps occasionally, but she has thrown up from this at times.  Otherwise doing well with good energy and appetite.  Denies pain, new cough, chest pain, dyspnea, or any other new or specific complaints.  ROS  All other systems reviewed and  negative  Past Medical History:  Diagnosis Date   Arthritis    Diabetes mellitus without complication (HCC)    Hypertension    met colon ca to liver 01/2020     Past Surgical History:  Procedure Laterality Date   ABDOMINAL HYSTERECTOMY     ANTERIOR AND POSTERIOR REPAIR WITH SACROSPINOUS FIXATION N/A 07/16/2016   Procedure: ANTERIOR AND POSTERIOR REPAIR WITH SACROSPINOUS FIXATION;  Surgeon: Curlene Agent, MD;  Location: [facility];  Service: Gynecology;  Laterality: N/A;   CYSTOSCOPY  07/16/2016   Procedure: CYSTOSCOPY;  Surgeon: Curlene Agent, MD;  Location: [facility];  Service: Gynecology;;   ESOPHAGOGASTRODUODENOSCOPY (EGD) WITH PROPOFOL  N/A 12/22/2019   Procedure: ESOPHAGOGASTRODUODENOSCOPY (EGD) WITH PROPOFOL ;  Surgeon: [name], MD;  Location: [facility];  Service: Endoscopy;  Laterality: N/A;   FINE NEEDLE ASPIRATION N/A 12/22/2019   Procedure: FINE NEEDLE ASPIRATION (FNA) LINEAR;  Surgeon: [name], MD;  Location: [facility];  Service: Endoscopy;  Laterality: N/A;   IR IMAGING GUIDED PORT INSERTION  01/24/2020   LAPAROSCOPIC VAGINAL HYSTERECTOMY WITH SALPINGECTOMY Bilateral 07/16/2016   Procedure: LAPAROSCOPIC ASSISTED VAGINAL HYSTERECTOMY WITH SALPINGECTOMY;  Surgeon: Curlene Agent, MD;  Location: [facility];  Service: Gynecology;  Laterality: Bilateral;   MYOMECTOMY ABDOMINAL APPROACH     THYROID SURGERY     tyroid     UPPER ESOPHAGEAL ENDOSCOPIC ULTRASOUND (EUS) N/A 12/22/2019   Procedure: UPPER ESOPHAGEAL ENDOSCOPIC ULTRASOUND (EUS);  Surgeon: [name], MD;  Location: [facility];  Service: Endoscopy;  Laterality: N/A;     Outpatient Encounter Medications as of 03/11/2023  Medication Sig   atorvastatin  (LIPITOR) 20 MG tablet Take 20 mg by mouth at bedtime.    hydrochlorothiazide  (MICROZIDE ) 12.5 MG capsule Take  12.5 mg by mouth daily.   KLOR-CON  M20 20 MEQ tablet TAKE 1 TABLET TWICE A DAY   lidocaine -prilocaine  (EMLA ) cream Apply 1 Application topically as  needed.   magic mouthwash w/lidocaine  SOLN Take 5 mLs by mouth 4 (four) times daily.   ondansetron  (ZOFRAN ) 8 MG tablet Take 1 tablet (8 mg total) by mouth every 8 (eight) hours as needed.   pantoprazole  (PROTONIX ) 20 MG tablet Take 1 tablet (20 mg total) by mouth daily.   prochlorperazine  (COMPAZINE ) 10 MG tablet Take 1 tablet (10 mg total) by mouth every 6 (six) hours as needed for nausea or vomiting.   spironolactone  (ALDACTONE ) 25 MG tablet Take 25 mg by mouth daily with breakfast.   No facility-administered encounter medications on file as of 03/11/2023.     Today's Vitals   03/11/23 0826 03/11/23 0827  BP: (!) 142/84   Pulse: (!) 58   Resp: 18   Temp: 98.3 F (36.8 C)   TempSrc: Temporal   SpO2: 100%   Weight: 182 lb (82.6 kg)   Height: 5' 6 (1.676 m)   PainSc:  0-No pain   Body mass index is 29.38 kg/m.   PHYSICAL EXAM GENERAL:alert, no distress and comfortable SKIN: no rash  EYES: sclera clear NECK: without mass LUNGS: clear with normal breathing effort HEART: regular rate & rhythm, no lower extremity edema ABDOMEN: abdomen soft, non-tender and normal bowel sounds NEURO: alert & oriented x 3 with fluent speech, no focal motor deficits PAC without erythema    CBC    Component Value Date/Time   WBC 8.9 03/11/2023 0745   WBC 5.3 03/20/2022 0748   RBC 4.27 03/11/2023 0745   HGB 12.0 03/11/2023 0745   HCT 37.9 03/11/2023 0745   PLT 268 03/11/2023 0745   MCV 88.8 03/11/2023 0745   MCH 28.1 03/11/2023 0745   MCHC 31.7 03/11/2023 0745   RDW 17.8 (H) 03/11/2023 0745   LYMPHSABS 1.8 03/11/2023 0745   MONOABS 0.6 03/11/2023 0745   EOSABS 0.2 03/11/2023 0745   BASOSABS 0.1 03/11/2023 0745     CMP     Component Value Date/Time   NA 139 03/11/2023 0745   K 3.8 03/11/2023 0745   CL 104 03/11/2023 0745   CO2 28 03/11/2023 0745   GLUCOSE 132 (H) 03/11/2023 0745   BUN 10 03/11/2023 0745   CREATININE 0.69 03/11/2023 0745   CALCIUM  9.6 03/11/2023 0745   PROT 7.4  03/11/2023 0745   ALBUMIN 4.1 03/11/2023 0745   AST 15 03/11/2023 0745   ALT 16 03/11/2023 0745   ALKPHOS 112 03/11/2023 0745   BILITOT 0.2 03/11/2023 0745   GFRNONAA >60 03/11/2023 0745   GFRAA >60 07/09/2016 0836     ASSESSMENT & PLAN:Lindsay [name] is a 67 y.o. female with    metastatic colon 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  -Liver biopsy 01/08/20 confirmed metastasis from primary colorectal cancer. PET scan showed hypermetabolism to known liver mets, diffuse thoracic and abdominal lymphadenopathy, a 1.8 cm LUL pulmonary nodule, and splenic flexure of colon. -treated with first line FOLFOX 01/25/20 - 10/02/20, Vectibix  added with C2. Oxali discontinued after C18 due to reaction. -switched to Xeloda  10/21/20, dose adjusted due to significant skin toxicity -Last CT CAP 03/19/22 showed progression in liver and gastrohepatic ligament LN; she began FOLFIRI/beva on 03/25/22 -Ms. Frentz appears stable. S/p cycle  6 tolerating well without significant side effects. Dirrhea, intermittent pain, and neuropathy are stable and adequately managed. She is able to recover and function well with good PS.  -Restaging CT's 06/16/22, 08/31/22, and 12/14/22 showed stable disease despite rising CEA -Ms. Wiederhold appears stable, s/p cycle 23 FOLFIRI/Beva, tolerating well with mild constipation and reflux.  These are partially managed with supportive care at home, will add PPI.  She is otherwise able to recover and function very well with good performance status -There is no clinical evidence of disease progression -Labs reviewed, CMP and CEA are pending.  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 The patient knows to call the clinic with any problems, questions or concerns. No barriers to learning were detected.   Florabel Faulks, NP-C 03/11/2023

## 2023-03-09 ENCOUNTER — Other Ambulatory Visit: Payer: Medicare (Managed Care)

## 2023-03-09 ENCOUNTER — Ambulatory Visit: Payer: Medicare (Managed Care) | Admitting: Nurse Practitioner

## 2023-03-11 ENCOUNTER — Other Ambulatory Visit: Payer: Self-pay

## 2023-03-11 ENCOUNTER — Encounter: Payer: Self-pay | Admitting: Nurse Practitioner

## 2023-03-11 ENCOUNTER — Inpatient Hospital Stay: Payer: Medicare (Managed Care)

## 2023-03-11 ENCOUNTER — Inpatient Hospital Stay: Payer: Medicare (Managed Care) | Attending: Hematology

## 2023-03-11 ENCOUNTER — Inpatient Hospital Stay: Payer: Medicare (Managed Care) | Admitting: Nurse Practitioner

## 2023-03-11 VITALS — BP 142/84 | HR 58 | Temp 98.3°F | Resp 18 | Ht 66.0 in | Wt 182.0 lb

## 2023-03-11 DIAGNOSIS — G620 Drug-induced polyneuropathy: Secondary | ICD-10-CM | POA: Diagnosis not present

## 2023-03-11 DIAGNOSIS — C189 Malignant neoplasm of colon, unspecified: Secondary | ICD-10-CM

## 2023-03-11 DIAGNOSIS — C787 Secondary malignant neoplasm of liver and intrahepatic bile duct: Secondary | ICD-10-CM | POA: Insufficient documentation

## 2023-03-11 DIAGNOSIS — T451X5D Adverse effect of antineoplastic and immunosuppressive drugs, subsequent encounter: Secondary | ICD-10-CM | POA: Diagnosis not present

## 2023-03-11 DIAGNOSIS — Z95828 Presence of other vascular implants and grafts: Secondary | ICD-10-CM

## 2023-03-11 DIAGNOSIS — C221 Intrahepatic bile duct carcinoma: Secondary | ICD-10-CM

## 2023-03-11 DIAGNOSIS — Z79899 Other long term (current) drug therapy: Secondary | ICD-10-CM | POA: Insufficient documentation

## 2023-03-11 DIAGNOSIS — Z90710 Acquired absence of both cervix and uterus: Secondary | ICD-10-CM | POA: Diagnosis not present

## 2023-03-11 DIAGNOSIS — C7800 Secondary malignant neoplasm of unspecified lung: Secondary | ICD-10-CM | POA: Diagnosis not present

## 2023-03-11 DIAGNOSIS — Z5111 Encounter for antineoplastic chemotherapy: Secondary | ICD-10-CM | POA: Insufficient documentation

## 2023-03-11 DIAGNOSIS — Z5189 Encounter for other specified aftercare: Secondary | ICD-10-CM | POA: Diagnosis not present

## 2023-03-11 DIAGNOSIS — Z5112 Encounter for antineoplastic immunotherapy: Secondary | ICD-10-CM | POA: Insufficient documentation

## 2023-03-11 DIAGNOSIS — C19 Malignant neoplasm of rectosigmoid junction: Secondary | ICD-10-CM | POA: Insufficient documentation

## 2023-03-11 LAB — CMP (CANCER CENTER ONLY)
ALT: 16 U/L (ref 0–44)
AST: 15 U/L (ref 15–41)
Albumin: 4.1 g/dL (ref 3.5–5.0)
Alkaline Phosphatase: 112 U/L (ref 38–126)
Anion gap: 7 (ref 5–15)
BUN: 10 mg/dL (ref 8–23)
CO2: 28 mmol/L (ref 22–32)
Calcium: 9.6 mg/dL (ref 8.9–10.3)
Chloride: 104 mmol/L (ref 98–111)
Creatinine: 0.69 mg/dL (ref 0.44–1.00)
GFR, Estimated: >60 mL/min (ref >60)
Glucose, Bld: 132 mg/dL — ABNORMAL HIGH (ref 70–99)
Potassium: 3.8 mmol/L (ref 3.5–5.1)
Sodium: 139 mmol/L (ref 135–145)
Total Bilirubin: 0.2 mg/dL (ref 0.0–1.2)
Total Protein: 7.4 g/dL (ref 6.5–8.1)

## 2023-03-11 LAB — TOTAL PROTEIN, URINE DIPSTICK: Protein, ur: NEGATIVE mg/dL

## 2023-03-11 LAB — CEA (ACCESS): CEA (CHCC): 25.15 ng/mL — ABNORMAL HIGH (ref 0.00–5.00)

## 2023-03-11 LAB — CBC WITH DIFFERENTIAL (CANCER CENTER ONLY)
Abs Immature Granulocytes: 0.06 10*3/uL (ref 0.00–0.07)
Basophils Absolute: 0.1 10*3/uL (ref 0.0–0.1)
Basophils Relative: 1 %
Eosinophils Absolute: 0.2 10*3/uL (ref 0.0–0.5)
Eosinophils Relative: 3 %
HCT: 37.9 % (ref 36.0–46.0)
Hemoglobin: 12 g/dL (ref 12.0–15.0)
Immature Granulocytes: 1 %
Lymphocytes Relative: 21 %
Lymphs Abs: 1.8 10*3/uL (ref 0.7–4.0)
MCH: 28.1 pg (ref 26.0–34.0)
MCHC: 31.7 g/dL (ref 30.0–36.0)
MCV: 88.8 fL (ref 80.0–100.0)
Monocytes Absolute: 0.6 10*3/uL (ref 0.1–1.0)
Monocytes Relative: 7 %
Neutro Abs: 6.1 10*3/uL (ref 1.7–7.7)
Neutrophils Relative %: 67 %
Platelet Count: 268 10*3/uL (ref 150–400)
RBC: 4.27 MIL/uL (ref 3.87–5.11)
RDW: 17.8 % — ABNORMAL HIGH (ref 11.5–15.5)
WBC Count: 8.9 10*3/uL (ref 4.0–10.5)
nRBC: 0 % (ref 0.0–0.2)

## 2023-03-11 MED ORDER — BEVACIZUMAB-AWWB CHEMO INJECTION 400 MG/16ML
5.0000 mg/kg | Freq: Once | INTRAVENOUS | Status: AC
  Administered 2023-03-11: 400 mg via INTRAVENOUS
  Filled 2023-03-11: qty 16

## 2023-03-11 MED ORDER — PANTOPRAZOLE SODIUM 20 MG PO TBEC: 20.0000 mg | DELAYED_RELEASE_TABLET | Freq: Every day | ORAL | 1 refills | Status: DC

## 2023-03-11 MED ORDER — ATROPINE SULFATE 1 MG/ML IV SOLN
0.5000 mg | Freq: Once | INTRAVENOUS | Status: AC | PRN
  Administered 2023-03-11: 0.5 mg via INTRAVENOUS
  Filled 2023-03-11: qty 1

## 2023-03-11 MED ORDER — SODIUM CHLORIDE 0.9 % IV SOLN
2400.0000 mg/m2 | INTRAVENOUS | Status: DC
  Administered 2023-03-11: 5000 mg via INTRAVENOUS
  Filled 2023-03-11: qty 100

## 2023-03-11 MED ORDER — SODIUM CHLORIDE 0.9 % IV SOLN
180.0000 mg/m2 | Freq: Once | INTRAVENOUS | Status: AC
  Administered 2023-03-11: 360 mg via INTRAVENOUS
  Filled 2023-03-11: qty 13

## 2023-03-11 MED ORDER — SODIUM CHLORIDE 0.9 % IV SOLN: Freq: Once | INTRAVENOUS | Status: AC

## 2023-03-11 MED ORDER — DEXAMETHASONE SODIUM PHOSPHATE 10 MG/ML IJ SOLN
10.0000 mg | Freq: Once | INTRAMUSCULAR | Status: AC
  Administered 2023-03-11: 10 mg via INTRAVENOUS
  Filled 2023-03-11: qty 1

## 2023-03-11 MED ORDER — SODIUM CHLORIDE 0.9% FLUSH
10.0000 mL | Freq: Once | INTRAVENOUS | Status: AC
  Administered 2023-03-11: 10 mL

## 2023-03-11 MED ORDER — SODIUM CHLORIDE 0.9 % IV SOLN
400.0000 mg/m2 | Freq: Once | INTRAVENOUS | Status: AC
  Administered 2023-03-11: 780 mg via INTRAVENOUS
  Filled 2023-03-11: qty 25

## 2023-03-11 MED ORDER — PALONOSETRON HCL INJECTION 0.25 MG/5ML
0.2500 mg | Freq: Once | INTRAVENOUS | Status: AC
  Administered 2023-03-11: 0.25 mg via INTRAVENOUS
  Filled 2023-03-11: qty 5

## 2023-03-11 NOTE — Patient Instructions (Signed)
 [facility] - A DEPT OF [facility]  Discharge Instructions: Thank you for choosing [facility] to provide your oncology and hematology care.   If you have a lab appointment with the Cancer Center, please go directly to the Cancer Center and check in at the registration area.   Wear comfortable clothing and clothing appropriate for easy access to any Portacath or PICC line.   We strive to give you quality time with your provider. You may need to reschedule your appointment if you arrive late (15 or more minutes).  Arriving late affects you and other patients whose appointments are after yours.  Also, if you miss three or more appointments without notifying the office, you may be dismissed from the clinic at the provider's discretion.      For prescription refill requests, have your pharmacy contact our office and allow 72 hours for refills to be completed.    Today you received the following chemotherapy and/or immunotherapy agents: Bevacizumab, Irinotecan, & Fluorouracil      To help prevent nausea and vomiting after your treatment, we encourage you to take your nausea medication as directed.  BELOW ARE SYMPTOMS THAT SHOULD BE REPORTED IMMEDIATELY: *FEVER GREATER THAN 100.4 F (38 C) OR HIGHER *CHILLS OR SWEATING *NAUSEA AND VOMITING THAT IS NOT CONTROLLED WITH YOUR NAUSEA MEDICATION *UNUSUAL SHORTNESS OF BREATH *UNUSUAL BRUISING OR BLEEDING *URINARY PROBLEMS (pain or burning when urinating, or frequent urination) *BOWEL PROBLEMS (unusual diarrhea, constipation, pain near the anus) TENDERNESS IN MOUTH AND THROAT WITH OR WITHOUT PRESENCE OF ULCERS (sore throat, sores in mouth, or a toothache) UNUSUAL RASH, SWELLING OR PAIN  UNUSUAL VAGINAL DISCHARGE OR ITCHING   Items with * indicate a potential emergency and should be followed up as soon as possible or go to the Emergency Department if any problems should occur.  Please show the  CHEMOTHERAPY ALERT CARD or IMMUNOTHERAPY ALERT CARD at check-in to the Emergency Department and triage nurse.  Should you have questions after your visit or need to cancel or reschedule your appointment, please contact [facility] - [facility]  Dept: [phone]  and follow the prompts.  Office hours are 8:00 a.m. to 4:30 p.m. Monday - Friday. Please note that voicemails left after 4:00 p.m. may not be returned until the following business day.  We are closed weekends and major holidays. You have access to a nurse at all times for urgent questions. Please call the main number to the clinic Dept: [phone] and follow the prompts.   For any non-urgent questions, you may also contact your provider using MyChart. We now offer e-Visits for anyone 66 and older to request care online for non-urgent symptoms. For details visit [email or web address].   Also download the MyChart app! Go to the app store, search "MyChart", open the app, select [facility], and log in with your MyChart username and password.

## 2023-03-13 ENCOUNTER — Inpatient Hospital Stay: Payer: Medicare (Managed Care)

## 2023-03-13 VITALS — BP 126/78 | HR 67 | Temp 97.2°F | Resp 17 | Ht 66.0 in

## 2023-03-13 DIAGNOSIS — C221 Intrahepatic bile duct carcinoma: Secondary | ICD-10-CM

## 2023-03-13 DIAGNOSIS — Z5112 Encounter for antineoplastic immunotherapy: Secondary | ICD-10-CM | POA: Diagnosis not present

## 2023-03-13 MED ORDER — HEPARIN SOD (PORK) LOCK FLUSH 100 UNIT/ML IV SOLN
500.0000 [IU] | Freq: Once | INTRAVENOUS | Status: AC | PRN
  Administered 2023-03-13: 500 [IU]

## 2023-03-13 MED ORDER — PEGFILGRASTIM INJECTION 6 MG/0.6ML ~~LOC~~
6.0000 mg | PREFILLED_SYRINGE | Freq: Once | SUBCUTANEOUS | Status: AC
  Administered 2023-03-13: 6 mg via SUBCUTANEOUS
  Filled 2023-03-13: qty 0.6

## 2023-03-13 MED ORDER — SODIUM CHLORIDE 0.9% FLUSH
10.0000 mL | INTRAVENOUS | Status: DC | PRN
  Administered 2023-03-13: 10 mL

## 2023-03-17 ENCOUNTER — Encounter: Payer: Self-pay | Admitting: Hematology

## 2023-03-22 ENCOUNTER — Encounter: Payer: Self-pay | Admitting: Hematology

## 2023-03-22 NOTE — Assessment & Plan Note (Signed)
MSS, [name]/[name] wildtype -diagnosed 12/2019 by porta hepatis LN biopsy during EUS for work up of abdominal pain and liver lesions seen on [place] and MRI. Liver biopsy 01/08/20 confirmed metastasis from primary colorectal cancer. PET scan showed hypermetabolism to known liver mets, diffuse thoracic and abdominal lymphadenopathy, a 1.8 cm LUL pulmonary nodule, and splenic flexure of colon. -treated with first line FOLFOX 01/25/20 - 10/02/20, Vectibix added with C2. Oxali discontinued after C18 due to reaction. -switched to Xeloda 10/21/20, dose adjusted due to significant skin toxicity -due to cancer progression on recent staging scan, treatment has been changed to FOLFIRI and bevacizumab on 03/25/22, she is overall tolerating well -will repeat CT scan after next cycle chemo  -Restaging CT scan from 06/16/2022 showed stable to mild decrease in size of treated liver metastasis. No new lesions -She is tolerating chemotherapy well overall, will continue -Her restaging CT scan on August 31, 2022 showed stable liver metastasis, and a stable 7 mm left lung nodule, indeterminate.  I personally reviewed the scan images with patient -Will continue current therapy.  We discussed the option of maintenance therapy with irinotecan and bevacizumab in future.  She is tolerating current FOLFIRI and bevacizumab well, will continue. -Restaging CT 12/17/22 showed stable disease, will continue chemo

## 2023-03-24 ENCOUNTER — Encounter: Payer: Self-pay | Admitting: Hematology

## 2023-03-24 ENCOUNTER — Inpatient Hospital Stay (HOSPITAL_BASED_OUTPATIENT_CLINIC_OR_DEPARTMENT_OTHER): Payer: Medicare (Managed Care) | Admitting: Hematology

## 2023-03-24 ENCOUNTER — Other Ambulatory Visit: Payer: Self-pay

## 2023-03-24 ENCOUNTER — Inpatient Hospital Stay: Payer: Medicare (Managed Care)

## 2023-03-24 ENCOUNTER — Ambulatory Visit (HOSPITAL_COMMUNITY)
Admission: RE | Admit: 2023-03-24 | Discharge: 2023-03-24 | Disposition: A | Discharge status: Home / Self Care | Payer: Medicare (Managed Care) | Source: Ambulatory Visit | Attending: Hematology | Admitting: Hematology

## 2023-03-24 VITALS — BP 139/81 | HR 76 | Temp 98.2°F | Resp 18

## 2023-03-24 DIAGNOSIS — C189 Malignant neoplasm of colon, unspecified: Secondary | ICD-10-CM

## 2023-03-24 DIAGNOSIS — C221 Intrahepatic bile duct carcinoma: Secondary | ICD-10-CM | POA: Insufficient documentation

## 2023-03-24 DIAGNOSIS — Z5112 Encounter for antineoplastic immunotherapy: Secondary | ICD-10-CM | POA: Diagnosis not present

## 2023-03-24 LAB — CBC WITH DIFFERENTIAL (CANCER CENTER ONLY)
Abs Immature Granulocytes: 0.05 10*3/uL (ref 0.00–0.07)
Basophils Absolute: 0 10*3/uL (ref 0.0–0.1)
Basophils Relative: 0 %
Eosinophils Absolute: 0.3 10*3/uL (ref 0.0–0.5)
Eosinophils Relative: 3 %
HCT: 37.4 % (ref 36.0–46.0)
Hemoglobin: 12 g/dL (ref 12.0–15.0)
Immature Granulocytes: 1 %
Lymphocytes Relative: 16 %
Lymphs Abs: 1.6 10*3/uL (ref 0.7–4.0)
MCH: 28.4 pg (ref 26.0–34.0)
MCHC: 32.1 g/dL (ref 30.0–36.0)
MCV: 88.6 fL (ref 80.0–100.0)
Monocytes Absolute: 0.7 10*3/uL (ref 0.1–1.0)
Monocytes Relative: 7 %
Neutro Abs: 7.3 10*3/uL (ref 1.7–7.7)
Neutrophils Relative %: 73 %
Platelet Count: 270 10*3/uL (ref 150–400)
RBC: 4.22 MIL/uL (ref 3.87–5.11)
RDW: 18.1 % — ABNORMAL HIGH (ref 11.5–15.5)
WBC Count: 9.9 10*3/uL (ref 4.0–10.5)
nRBC: 0 % (ref 0.0–0.2)

## 2023-03-24 LAB — CMP (CANCER CENTER ONLY)
ALT: 12 U/L (ref 0–44)
AST: 12 U/L — ABNORMAL LOW (ref 15–41)
Albumin: 4 g/dL (ref 3.5–5.0)
Alkaline Phosphatase: 116 U/L (ref 38–126)
Anion gap: 7 (ref 5–15)
BUN: 10 mg/dL (ref 8–23)
CO2: 27 mmol/L (ref 22–32)
Calcium: 9.6 mg/dL (ref 8.9–10.3)
Chloride: 105 mmol/L (ref 98–111)
Creatinine: 0.74 mg/dL (ref 0.44–1.00)
GFR, Estimated: >60 mL/min (ref >60)
Glucose, Bld: 133 mg/dL — ABNORMAL HIGH (ref 70–99)
Potassium: 3.6 mmol/L (ref 3.5–5.1)
Sodium: 139 mmol/L (ref 135–145)
Total Bilirubin: 0.3 mg/dL (ref 0.0–1.2)
Total Protein: 7.3 g/dL (ref 6.5–8.1)

## 2023-03-24 LAB — TOTAL PROTEIN, URINE DIPSTICK: Protein, ur: NEGATIVE mg/dL

## 2023-03-24 LAB — CEA (ACCESS): CEA (CHCC): 27.73 ng/mL — ABNORMAL HIGH (ref 0.00–5.00)

## 2023-03-24 MED ORDER — FLUOROURACIL CHEMO INJECTION 5 GM/100ML
2400.0000 mg/m2 | INTRAVENOUS | Status: DC
  Administered 2023-03-24: 5000 mg via INTRAVENOUS
  Filled 2023-03-24: qty 100

## 2023-03-24 MED ORDER — ATROPINE SULFATE 1 MG/ML IV SOLN
0.5000 mg | Freq: Once | INTRAVENOUS | Status: AC | PRN
  Administered 2023-03-24: 0.5 mg via INTRAVENOUS
  Filled 2023-03-24: qty 1

## 2023-03-24 MED ORDER — HEPARIN SOD (PORK) LOCK FLUSH 100 UNIT/ML IV SOLN: 500.0000 [IU] | Freq: Once | INTRAVENOUS | Status: DC | PRN

## 2023-03-24 MED ORDER — SODIUM CHLORIDE 0.9% FLUSH
10.0000 mL | INTRAVENOUS | Status: DC | PRN
Start: 2023-03-24 — End: 2023-03-24

## 2023-03-24 MED ORDER — SODIUM CHLORIDE 0.9 % IV SOLN
400.0000 mg/m2 | Freq: Once | INTRAVENOUS | Status: AC
  Administered 2023-03-24: 780 mg via INTRAVENOUS
  Filled 2023-03-24: qty 17.5

## 2023-03-24 MED ORDER — IRINOTECAN HCL CHEMO INJECTION 100 MG/5ML
180.0000 mg/m2 | Freq: Once | INTRAVENOUS | Status: AC
  Administered 2023-03-24: 360 mg via INTRAVENOUS
  Filled 2023-03-24: qty 15

## 2023-03-24 MED ORDER — HEPARIN SOD (PORK) LOCK FLUSH 100 UNIT/ML IV SOLN: 500.0000 [IU] | Freq: Once | INTRAVENOUS | Status: DC

## 2023-03-24 MED ORDER — SODIUM CHLORIDE 0.9 % IV SOLN
5.0000 mg/kg | Freq: Once | INTRAVENOUS | Status: AC
  Administered 2023-03-24: 400 mg via INTRAVENOUS
  Filled 2023-03-24: qty 16

## 2023-03-24 MED ORDER — SODIUM CHLORIDE 0.9 % IV SOLN: Freq: Once | INTRAVENOUS | Status: AC

## 2023-03-24 MED ORDER — DEXAMETHASONE SODIUM PHOSPHATE 10 MG/ML IJ SOLN
10.0000 mg | Freq: Once | INTRAMUSCULAR | Status: AC
  Administered 2023-03-24: 10 mg via INTRAVENOUS
  Filled 2023-03-24: qty 1

## 2023-03-24 MED ORDER — PALONOSETRON HCL INJECTION 0.25 MG/5ML
0.2500 mg | Freq: Once | INTRAVENOUS | Status: AC
  Administered 2023-03-24: 0.25 mg via INTRAVENOUS
  Filled 2023-03-24: qty 5

## 2023-03-24 MED ORDER — HEPARIN SOD (PORK) LOCK FLUSH 100 UNIT/ML IV SOLN
INTRAVENOUS | Status: AC
  Filled 2023-03-24: qty 5

## 2023-03-24 MED ORDER — IOHEXOL 300 MG/ML  SOLN
30.0000 mL | Freq: Once | INTRAMUSCULAR | Status: AC | PRN
  Administered 2023-03-24: 30 mL via ORAL

## 2023-03-24 MED ORDER — IOHEXOL 300 MG/ML  SOLN
100.0000 mL | Freq: Once | INTRAMUSCULAR | Status: AC | PRN
  Administered 2023-03-24: 100 mL via INTRAVENOUS

## 2023-03-24 NOTE — Patient Instructions (Signed)
 [facility] - A DEPT OF [place]  Discharge Instructions: Thank you for choosing [facility] to provide your oncology and hematology care.   If you have a lab appointment with the Cancer Center, please go directly to the Cancer Center and check in at the registration area.   Wear comfortable clothing and clothing appropriate for easy access to any Portacath or PICC line.   We strive to give you quality time with your provider. You may need to reschedule your appointment if you arrive late (15 or more minutes).  Arriving late affects you and other patients whose appointments are after yours.  Also, if you miss three or more appointments without notifying the office, you may be dismissed from the clinic at the provider's discretion.      For prescription refill requests, have your pharmacy contact our office and allow 72 hours for refills to be completed.    Today you received the following chemotherapy and/or immunotherapy agents irinotecan , leucovoring, fluorourcil, bevacizumab       To help prevent nausea and vomiting after your treatment, we encourage you to take your nausea medication as directed.  BELOW ARE SYMPTOMS THAT SHOULD BE REPORTED IMMEDIATELY: *FEVER GREATER THAN 100.4 F (38 C) OR HIGHER *CHILLS OR SWEATING *NAUSEA AND VOMITING THAT IS NOT CONTROLLED WITH YOUR NAUSEA MEDICATION *UNUSUAL SHORTNESS OF BREATH *UNUSUAL BRUISING OR BLEEDING *URINARY PROBLEMS (pain or burning when urinating, or frequent urination) *BOWEL PROBLEMS (unusual diarrhea, constipation, pain near the anus) TENDERNESS IN MOUTH AND THROAT WITH OR WITHOUT PRESENCE OF ULCERS (sore throat, sores in mouth, or a toothache) UNUSUAL RASH, SWELLING OR PAIN  UNUSUAL VAGINAL DISCHARGE OR ITCHING   Items with * indicate a potential emergency and should be followed up as soon as possible or go to the Emergency Department if any problems should occur.  Please show the  CHEMOTHERAPY ALERT CARD or IMMUNOTHERAPY ALERT CARD at check-in to the Emergency Department and triage nurse.  Should you have questions after your visit or need to cancel or reschedule your appointment, please contact [facility] - A DEPT OF [facility]  Dept: [phone]  and follow the prompts.  Office hours are 8:00 a.m. to 4:30 p.m. Monday - Friday. Please note that voicemails left after 4:00 p.m. may not be returned until the following business day.  We are closed weekends and major holidays. You have access to a nurse at all times for urgent questions. Please call the main number to the clinic Dept: [phone] and follow the prompts.   For any non-urgent questions, you may also contact your provider using MyChart. We now offer e-Visits for anyone 80 and older to request care online for non-urgent symptoms. For details visit [email or web address].   Also download the MyChart app! Go to the app store, search "MyChart", open the app, select [place], and log in with your MyChart username and password.

## 2023-03-24 NOTE — Progress Notes (Signed)
 [facility]   Telephone:([phone]) [phone] [phone]   Clinic Follow up Note   Patient Care Team: [name], MD as PCP - General (Internal Medicine) Berna Breslow, RN (Inactive) as Oncology Nurse Navigator Sonja Mason, MD as Consulting Physician (Oncology) Alvis Jourdain, MD as Consulting Physician (Gastroenterology)  Date of Service:  03/24/2023  CHIEF COMPLAINT: f/u of colon cancer  CURRENT THERAPY:  FOLFIRI and bevacizumab   Oncology History   metastatic colon 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 Liver biopsy 01/08/20 confirmed metastasis from primary colorectal cancer. PET scan showed hypermetabolism to known liver mets, diffuse thoracic and abdominal lymphadenopathy, a 1.8 cm LUL pulmonary nodule, and splenic flexure of colon. -treated with first line FOLFOX 01/25/20 - 10/02/20, Vectibix  added with C2. Oxali discontinued after C18 due to reaction. -switched to Xeloda  10/21/20, dose adjusted due to significant skin toxicity -due to cancer progression on recent staging scan, treatment has been changed to FOLFIRI and bevacizumab  on 03/25/22, she is overall tolerating well -will repeat CT scan after next cycle chemo  -Restaging CT scan from 06/16/2022 showed stable to mild decrease in size of treated liver metastasis. No new lesions -She is tolerating chemotherapy well overall, will continue -Her restaging CT scan on August 31, 2022 showed stable liver metastasis, and a stable 7 mm left lung nodule, indeterminate.  I personally reviewed the scan images with patient -Will continue current therapy.  We discussed the option of maintenance therapy with irinotecan  and bevacizumab  in future.  She is tolerating current FOLFIRI and bevacizumab  well, will continue. -Restaging CT 12/17/22 showed stable disease, will continue chemo     Assessment and  Plan    Metastatic Colon Cancer Follow-up for metastatic colon cancer. -She is clinically doing well, lab reviewed, adequate for treatment -Will proceed to chemo today -Restaging CT scan was done this morning, formal report still pending.  I reviewed the image myself, it appears to be stable disease to me.  Gastroesophageal Reflux Disease (GERD) Reports nocturnal acid reflux with chest discomfort. Partial relief with Tums and Pepcid . Discussed stronger anti-acid medications if needed. Advised Pepcid  before bed and Gas-X for bloating. - Take Pepcid  before bed - Consider Gas-X for bloating - Consider stronger anti-acid medication if symptoms persist.      Plan -Lab reviewed, adequate for treatment, will proceed chemo at the same dose today -Call her when the restaging CT scan report is back.   SUMMARY OF ONCOLOGIC HISTORY: Oncology History  metastatic colon cancer to liver  06/20/2019 Procedure   Colonoscopy by Dr Brice Campi 06/20/19  IMPRESSION -Seven 3 to 10 mm polyps in the sigmoid colon, in the transverse colon and in the escending colon, removed with a cold snare. Resected and retrireved.  -One 20mm polyp in the descending colon. Biopsies. Tattoes.  -Mediaum sized lipoma in the ascending colon.   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 See Comment B. Colon, Ascending, polyp, Polypectomy:  -TUBULAR ADENOMA -No high grade dysplasia or malignancy.  C. Colon, TRansverse, Polyo, polectomy:  -TUBULAR ADENOMA -No high grade dysplasia or malignancy.  D. Colon, Sigmoid, Polyp, Polypectomy:  -HYPERPLASTIC POLYP   11/07/2019 Imaging   US  Abdomen 11/07/19  IMPRESSION: 1. Two solid masses in the liver are nonspecific. Recommend MRI abdomen with and without contrast for further evaluation.   12/15/2019  Imaging   MRI Abdomen 12/15/19  IMPRESSION: 1. There are two large masses in the liver with  appearance favoring metastatic disease or hepatocellular carcinoma or cholangiocarcinoma. A benign etiology is highly unlikely given the enhancement pattern and associated adenopathy. 2. Considerable porta hepatis and retroperitoneal adenopathy. Some of the confluent porta hepatis tumor is potentially infiltrative and abuts the pancreatic body along its right upper margin, making it difficult to completely exclude the possibility of pancreatic adenocarcinoma primary. Possibilities helpful in further workup might include tissue diagnosis, endoscopic ultrasound, or nuclear medicine PET-CT. 3. Pancreas divisum. 4. Lumbar spondylosis and degenerative disc disease. 5. Despite efforts by the technologist and patient, motion artifact is present on today's exam and could not be eliminated. This reduces exam sensitivity and specificity.   12/22/2019 Procedure   Upper Endoscopy by Dr Nickey Barn 12/22/19  IMPRESSION - One lymph node was visualized and measured in the porta hepatis region. Fine needle aspiration performed    12/22/2019 Initial Biopsy   A. LIVER, PORTA HEPATIS MASS, FINE NEEDLE 12/22/19 ASPIRATION:  FINAL MICROSCOPIC DIAGNOSIS:  - Malignant cells consistent with metastatic adenocarcinoma   01/01/2020 Initial Diagnosis   Intrahepatic cholangiocarcinoma (HCC)   01/08/2020 Initial Biopsy   FINAL MICROSCOPIC DIAGNOSIS:   A. LIVER, LEFT LOBE, BIOPSY:  - Metastatic adenocarcinoma, consistent with a colorectal primary.  See  comment      COMMENT:   Immunohistochemical stains show the tumor cells are positive for CK20  and CDX2 but negative for CK7, consistent with above interpretation.  Dr. Maryalice Smaller was notified on 01/10/2020   01/08/2020 Genetic Testing   [name]  [name] wildtype and [name]/[name] mutations which make her eligible for target biological agent Vectibix .   01/24/2020 Procedure   PAC placed 01/24/20   01/25/2020 -  Chemotherapy   first line FOLFOX starting  01/25/2020, Vextibix  added with C2 (02/06/20)   02/06/2020 - 02/06/2022 Chemotherapy   Patient is on Treatment Plan : COLORECTAL Panitumumab  q14d ([name] - Type Gene Only)     06/20/2020 Imaging   CT CAP  IMPRESSION: 1. Continued interval reduction in size and conspicuity of a subsolid nodule of the peripheral left upper lobe. 2. Unchanged prominent pretracheal and subcarinal lymph nodes. 3. Redemonstrated partially calcified low-attenuation liver masses, slightly decreased in size compared to prior examination. 4. Slight interval decrease in size of a portacaval lymph node or conglomerate and retroperitoneal lymph nodes. 5. Findings are consistent with continued treatment response of nodal, pulmonary, and hepatic metastatic disease. 6. Coronary artery disease.   Aortic Atherosclerosis (ICD10-I70.0).     10/09/2020 Imaging   IMPRESSION: 1. Slight decrease in size of dominant liver mass with smaller liver mass within 1-2 mm of prior measurement. 2. Stable appearance of celiac lymph and retroperitoneal lymph nodes. Dominant node with calcification in the gastrohepatic ligament as described. 3. Continued decrease in size of LEFT upper lobe nodule. 4. Three-vessel coronary artery calcification. 5. Aortic atherosclerosis.   03/25/2022 -  Chemotherapy   Patient is on Treatment Plan : COLORECTAL FOLFIRI + Bevacizumab  q14d     08/31/2022 Imaging    IMPRESSION: 1. Unchanged, densely calcified liver lesions, consistent with treated metastatic disease. 2. Unchanged, irregular subpleural opacity of the peripheral left upper lobe. 3. Unchanged subcentimeter gastrohepatic ligament lymph node. 4. No evidence of new metastatic disease in the chest, abdomen, or pelvis. 5. Status post hysterectomy. 6. Coronary artery disease. 7. Aortic valve calcifications. Correlate for echocardiographic evidence of aortic valve dysfunction.  Discussed the use of AI scribe software for  clinical note transcription with the patient, who gave verbal consent to proceed.  History of Present Illness   The patient, a 68 year old with metastatic colon cancer, presents for a follow-up visit. She reports feeling well overall, with a significant improvement in symptoms since the new year. She has been able to engage in long walks, which she enjoys. However, she has been experiencing some discomfort, particularly at night, which she describes as a feeling of bloating and acid reflux. This discomfort is severe enough to prevent her from lying down to sleep, and she has been managing it with Tums and Pepcid , which provide some relief. She has also tried Gas-X for the bloating. She has not noticed any changes in her bowel movements or any other new symptoms.         All other systems were reviewed with the patient and are negative.  MEDICAL HISTORY:  Past Medical History:  Diagnosis Date   Arthritis    Diabetes mellitus without complication (HCC)    Hypertension    met colon ca to liver 01/2020    SURGICAL HISTORY: Past Surgical History:  Procedure Laterality Date   ABDOMINAL HYSTERECTOMY     ANTERIOR AND POSTERIOR REPAIR WITH SACROSPINOUS FIXATION N/A 07/16/2016   Procedure: ANTERIOR AND POSTERIOR REPAIR WITH SACROSPINOUS FIXATION;  Surgeon: Thora Flint, MD;  Location: [facility];  Service: Gynecology;  Laterality: N/A;   CYSTOSCOPY  07/16/2016   Procedure: CYSTOSCOPY;  Surgeon: Thora Flint, MD;  Location: [facility];  Service: Gynecology;;   ESOPHAGOGASTRODUODENOSCOPY (EGD) WITH PROPOFOL  N/A 12/22/2019   Procedure: ESOPHAGOGASTRODUODENOSCOPY (EGD) WITH PROPOFOL ;  Surgeon: Alvis Jourdain, MD;  Location: [facility];  Service: Endoscopy;  Laterality: N/A;   FINE NEEDLE ASPIRATION N/A 12/22/2019   Procedure: FINE NEEDLE ASPIRATION (FNA) LINEAR;  Surgeon: Alvis Jourdain, MD;  Location: [facility];  Service: Endoscopy;  Laterality: N/A;   IR IMAGING GUIDED PORT INSERTION  01/24/2020    LAPAROSCOPIC VAGINAL HYSTERECTOMY WITH SALPINGECTOMY Bilateral 07/16/2016   Procedure: LAPAROSCOPIC ASSISTED VAGINAL HYSTERECTOMY WITH SALPINGECTOMY;  Surgeon: Thora Flint, MD;  Location: [facility];  Service: Gynecology;  Laterality: Bilateral;   MYOMECTOMY ABDOMINAL APPROACH     THYROID  SURGERY     tyroid     UPPER ESOPHAGEAL ENDOSCOPIC ULTRASOUND (EUS) N/A 12/22/2019   Procedure: UPPER ESOPHAGEAL ENDOSCOPIC ULTRASOUND (EUS);  Surgeon: Alvis Jourdain, MD;  Location: [facility];  Service: Endoscopy;  Laterality: N/A;    I have reviewed the social history and family history with the patient and they are unchanged from previous note.  ALLERGIES:  is allergic to oxaliplatin .  MEDICATIONS:  Current Outpatient Medications  Medication Sig Dispense Refill   atorvastatin  (LIPITOR) 20 MG tablet Take 20 mg by mouth at bedtime.   1   hydrochlorothiazide  (MICROZIDE ) 12.5 MG capsule Take 12.5 mg by mouth daily.     KLOR-CON  M20 20 MEQ tablet TAKE 1 TABLET TWICE A DAY 180 tablet 3   lidocaine -prilocaine  (EMLA ) cream Apply 1 Application topically as needed. 30 g 1   magic mouthwash w/lidocaine  SOLN Take 5 mLs by mouth 4 (four) times daily. 475 mL 2   ondansetron  (ZOFRAN ) 8 MG tablet Take 1 tablet (8 mg total) by mouth every 8 (eight) hours as needed. 20 tablet 0   pantoprazole  (PROTONIX ) 20 MG tablet Take 1 tablet (20 mg total) by mouth daily. 30 tablet 1   prochlorperazine  (COMPAZINE ) 10 MG tablet Take 1 tablet (10 mg total) by mouth every 6 (  six) hours as needed for nausea or vomiting. 30 tablet 2   spironolactone  (ALDACTONE ) 25 MG tablet Take 25 mg by mouth daily with breakfast.     No current facility-administered medications for this visit.   Facility-Administered Medications Ordered in Other Visits  Medication Dose Route Frequency Provider Last Rate Last Admin   0.9 %  sodium chloride  infusion   Intravenous Once Sonja [place], MD       bevacizumab -awwb (MVASI ) 400 mg in sodium chloride  0.9 % 100 mL  chemo infusion  5 mg/kg (Treatment Plan Recorded) Intravenous Once Sonja [place], MD       dexamethasone  (DECADRON ) injection 10 mg  10 mg Intravenous Once Sonja [place], MD       fluorouracil  (ADRUCIL ) 5,000 mg in sodium chloride  0.9 % 150 mL chemo infusion  2,400 mg/m2 (Treatment Plan Recorded) Intravenous 1 day or 1 dose Sonja [place], MD       heparin  lock flush 100 unit/mL  500 Units Intravenous Once Sonja [place], MD       heparin  lock flush 100 unit/mL  500 Units Intracatheter Once PRN Sonja [place], MD       irinotecan  (CAMPTOSAR ) 360 mg in sodium chloride  0.9 % 500 mL chemo infusion  180 mg/m2 (Treatment Plan Recorded) Intravenous Once Sonja [place], MD       leucovorin  780 mg in sodium chloride  0.9 % 250 mL infusion  400 mg/m2 (Treatment Plan Recorded) Intravenous Once Sonja [place], MD       palonosetron  (ALOXI ) injection 0.25 mg  0.25 mg Intravenous Once Sonja [place], MD       sodium chloride  flush (NS) 0.9 % injection 10 mL  10 mL Intracatheter PRN Sonja [place], MD        PHYSICAL EXAMINATION: ECOG PERFORMANCE STATUS: 0 - Asymptomatic  There were no vitals filed for this visit. Wt Readings from Last 3 Encounters:  03/11/23 182 lb (82.6 kg)  02/24/23 182 lb 1.6 oz (82.6 kg)  02/10/23 182 lb 11.2 oz (82.9 kg)     GENERAL:alert, no distress and comfortable SKIN: skin color, texture, turgor are normal, no rashes or significant lesions EYES: normal, Conjunctiva are pink and non-injected, sclera clear NECK: supple, thyroid  normal size, non-tender, without nodularity LYMPH:  no palpable lymphadenopathy in the cervical, axillary  LUNGS: clear to auscultation and percussion with normal breathing effort HEART: regular rate & rhythm and no murmurs and no lower extremity edema ABDOMEN:abdomen soft, non-tender and normal bowel sounds Musculoskeletal:no cyanosis of digits and no clubbing  NEURO: alert & oriented x 3 with fluent speech, no focal motor/sensory deficits  LABORATORY DATA:  I have reviewed the data as  listed    Latest Ref Rng & Units 03/24/2023    7:28 AM 03/11/2023    7:45 AM 02/24/2023    9:58 AM  CBC  WBC 4.0 - 10.5 K/uL 9.9  8.9  8.4   Hemoglobin 12.0 - 15.0 g/dL 16.1  09.6  04.5   Hematocrit 36.0 - 46.0 % 37.4  37.9  38.7   Platelets 150 - 400 K/uL 270  268  256         Latest Ref Rng & Units 03/24/2023    7:28 AM 03/11/2023    7:45 AM 02/24/2023    9:58 AM  CMP  Glucose 70 - 99 mg/dL 409  811  914   BUN 8 - 23 mg/dL 10  10  10    Creatinine 0.44 - 1.00 mg/dL 7.82  9.56  2.13  Sodium 135 - 145 mmol/L 139  139  138   Potassium 3.5 - 5.1 mmol/L 3.6  3.8  3.7   Chloride 98 - 111 mmol/L 105  104  103   CO2 22 - 32 mmol/L 27  28  29    Calcium  8.9 - 10.3 mg/dL 9.6  9.6  9.6   Total Protein 6.5 - 8.1 g/dL 7.3  7.4  7.3   Total Bilirubin 0.0 - 1.2 mg/dL 0.3  0.2  0.3   Alkaline Phos 38 - 126 U/L 116  112  120   AST 15 - 41 U/L 12  15  16    ALT 0 - 44 U/L 12  16  18        RADIOGRAPHIC STUDIES: I have personally reviewed the radiological images as listed and agreed with the findings in the report. No results found.    No orders of the defined types were placed in this encounter.  All questions were answered. The patient knows to call the clinic with any problems, questions or concerns. No barriers to learning was detected. The total time spent in the appointment was 25 minutes.     Sonja Virgilina, MD 03/24/2023

## 2023-03-24 NOTE — Progress Notes (Signed)
 At the end of treatment, pt c/o chest pain, similar to the "gas pain I have been having at home over the past month." Pt rated pain 10/10. Pt was diaphoretic, BP elevated, HR elevated. Helped pt to bathroom. Pt stated pain had decreased to 7/10. When patient was seated again in chair pain was 0/10. Vital signs had returned to normal. Dr. Maryalice Smaller at chairside. EKG completed. MD referred pt to cardiology and instructed her to start on pepcid  and gasX, as well as to record when pains occurred and at what severity. Pt states understanding. Pt discharged in stable condition.

## 2023-03-26 ENCOUNTER — Inpatient Hospital Stay: Payer: Medicare (Managed Care)

## 2023-03-26 ENCOUNTER — Other Ambulatory Visit: Payer: Self-pay

## 2023-03-26 VITALS — BP 91/58 | HR 71 | Temp 98.9°F | Resp 16

## 2023-03-26 DIAGNOSIS — C221 Intrahepatic bile duct carcinoma: Secondary | ICD-10-CM

## 2023-03-26 DIAGNOSIS — Z5112 Encounter for antineoplastic immunotherapy: Secondary | ICD-10-CM | POA: Diagnosis not present

## 2023-03-26 DIAGNOSIS — C189 Malignant neoplasm of colon, unspecified: Secondary | ICD-10-CM

## 2023-03-26 DIAGNOSIS — R0789 Other chest pain: Secondary | ICD-10-CM

## 2023-03-26 MED ORDER — HEPARIN SOD (PORK) LOCK FLUSH 100 UNIT/ML IV SOLN
500.0000 [IU] | Freq: Once | INTRAVENOUS | Status: AC | PRN
Start: 2023-03-26 — End: 2023-03-26
  Administered 2023-03-26: 500 [IU]

## 2023-03-26 MED ORDER — PEGFILGRASTIM INJECTION 6 MG/0.6ML ~~LOC~~
6.0000 mg | PREFILLED_SYRINGE | Freq: Once | SUBCUTANEOUS | Status: AC
  Administered 2023-03-26: 6 mg via SUBCUTANEOUS
  Filled 2023-03-26: qty 0.6

## 2023-03-26 MED ORDER — SODIUM CHLORIDE 0.9% FLUSH
10.0000 mL | INTRAVENOUS | Status: DC | PRN
Start: 2023-03-26 — End: 2023-03-26
  Administered 2023-03-26: 10 mL

## 2023-03-26 NOTE — Progress Notes (Signed)
Sent a referral to Dr. Yates Decamp in Cardiology requesting if Dr. Jacinto Halim could see pt in clinic d/t chest pain episode while in infusion on 03/24/2023.  Pt was previously a pt of Dr. Jacinto Halim.  Sent a staff message as well to make Dr. Jacinto Halim aware of the pt's symptoms while in clinic on 03/24/2023.  Awaiting Dr. Jacinto Halim and staff's response to referral and staff message.

## 2023-04-01 ENCOUNTER — Encounter: Payer: Self-pay | Admitting: Hematology

## 2023-04-03 ENCOUNTER — Other Ambulatory Visit: Payer: Self-pay

## 2023-04-06 NOTE — Assessment & Plan Note (Signed)
MSS, [name]/[name] wildtype -diagnosed 12/2019 by porta hepatis LN biopsy during EUS for work up of abdominal pain and liver lesions seen on [place] and MRI. Liver biopsy 01/08/20 confirmed metastasis from primary colorectal cancer. PET scan showed hypermetabolism to known liver mets, diffuse thoracic and abdominal lymphadenopathy, a 1.8 cm LUL pulmonary nodule, and splenic flexure of colon. -treated with first line FOLFOX 01/25/20 - 10/02/20, Vectibix added with C2. Oxali discontinued after C18 due to reaction. -switched to Xeloda 10/21/20, dose adjusted due to significant skin toxicity -due to cancer progression, treatment has been changed to FOLFIRI and bevacizumab on 03/25/22, she is overall tolerating well -will repeat CT scan after next cycle chemo  -Restaging CT scan from 06/16/2022 showed stable to mild decrease in size of treated liver metastasis. No new lesions -She is tolerating chemotherapy well overall, will continue -Her restaging CT scan on August 31, 2022 showed stable liver metastasis, and a stable 7 mm left lung nodule, indeterminate.  I personally reviewed the scan images with patient -Will continue current therapy.  We discussed the option of maintenance therapy with irinotecan and bevacizumab in future.  She is tolerating current FOLFIRI and bevacizumab well, will continue. -Restaging CT 03/24/2023 showed stable disease, will continue chemo

## 2023-04-07 ENCOUNTER — Inpatient Hospital Stay: Payer: Medicare (Managed Care) | Admitting: Hematology

## 2023-04-07 ENCOUNTER — Inpatient Hospital Stay: Payer: Medicare (Managed Care)

## 2023-04-07 VITALS — BP 142/76 | HR 63 | Temp 97.6°F | Resp 17 | Wt 181.9 lb

## 2023-04-07 DIAGNOSIS — C221 Intrahepatic bile duct carcinoma: Secondary | ICD-10-CM

## 2023-04-07 DIAGNOSIS — Z95828 Presence of other vascular implants and grafts: Secondary | ICD-10-CM

## 2023-04-07 DIAGNOSIS — C189 Malignant neoplasm of colon, unspecified: Secondary | ICD-10-CM

## 2023-04-07 DIAGNOSIS — Z5112 Encounter for antineoplastic immunotherapy: Secondary | ICD-10-CM | POA: Diagnosis not present

## 2023-04-07 LAB — CBC WITH DIFFERENTIAL (CANCER CENTER ONLY)
Abs Immature Granulocytes: 0.42 10*3/uL — ABNORMAL HIGH (ref 0.00–0.07)
Basophils Absolute: 0.1 10*3/uL (ref 0.0–0.1)
Basophils Relative: 1 %
Eosinophils Absolute: 0.3 10*3/uL (ref 0.0–0.5)
Eosinophils Relative: 2 %
HCT: 36.9 % (ref 36.0–46.0)
Hemoglobin: 11.8 g/dL — ABNORMAL LOW (ref 12.0–15.0)
Immature Granulocytes: 3 %
Lymphocytes Relative: 11 %
Lymphs Abs: 1.7 10*3/uL (ref 0.7–4.0)
MCH: 28.1 pg (ref 26.0–34.0)
MCHC: 32 g/dL (ref 30.0–36.0)
MCV: 87.9 fL (ref 80.0–100.0)
Monocytes Absolute: 0.9 10*3/uL (ref 0.1–1.0)
Monocytes Relative: 6 %
Neutro Abs: 11.4 10*3/uL — ABNORMAL HIGH (ref 1.7–7.7)
Neutrophils Relative %: 77 %
Platelet Count: 259 10*3/uL (ref 150–400)
RBC: 4.2 MIL/uL (ref 3.87–5.11)
RDW: 18 % — ABNORMAL HIGH (ref 11.5–15.5)
WBC Count: 14.7 10*3/uL — ABNORMAL HIGH (ref 4.0–10.5)
nRBC: 0 % (ref 0.0–0.2)

## 2023-04-07 LAB — TOTAL PROTEIN, URINE DIPSTICK: Protein, ur: NEGATIVE mg/dL

## 2023-04-07 LAB — CMP (CANCER CENTER ONLY)
ALT: 10 U/L (ref 0–44)
AST: 12 U/L — ABNORMAL LOW (ref 15–41)
Albumin: 4 g/dL (ref 3.5–5.0)
Alkaline Phosphatase: 129 U/L — ABNORMAL HIGH (ref 38–126)
Anion gap: 6 (ref 5–15)
BUN: 11 mg/dL (ref 8–23)
CO2: 27 mmol/L (ref 22–32)
Calcium: 9.4 mg/dL (ref 8.9–10.3)
Chloride: 106 mmol/L (ref 98–111)
Creatinine: 0.65 mg/dL (ref 0.44–1.00)
GFR, Estimated: >60 mL/min (ref >60)
Glucose, Bld: 141 mg/dL — ABNORMAL HIGH (ref 70–99)
Potassium: 3.6 mmol/L (ref 3.5–5.1)
Sodium: 139 mmol/L (ref 135–145)
Total Bilirubin: 0.2 mg/dL (ref 0.0–1.2)
Total Protein: 7.3 g/dL (ref 6.5–8.1)

## 2023-04-07 LAB — CEA (ACCESS): CEA (CHCC): 34.35 ng/mL — ABNORMAL HIGH (ref 0.00–5.00)

## 2023-04-07 MED ORDER — SODIUM CHLORIDE 0.9 % IV SOLN
5.0000 mg/kg | Freq: Once | INTRAVENOUS | Status: AC
  Administered 2023-04-07: 400 mg via INTRAVENOUS
  Filled 2023-04-07: qty 16

## 2023-04-07 MED ORDER — SODIUM CHLORIDE 0.9% FLUSH
10.0000 mL | INTRAVENOUS | Status: DC | PRN
Start: 2023-04-07 — End: 2023-04-07

## 2023-04-07 MED ORDER — DEXAMETHASONE SODIUM PHOSPHATE 10 MG/ML IJ SOLN
10.0000 mg | Freq: Once | INTRAMUSCULAR | Status: AC
  Administered 2023-04-07: 10 mg via INTRAVENOUS
  Filled 2023-04-07: qty 1

## 2023-04-07 MED ORDER — SODIUM CHLORIDE 0.9 % IV SOLN
400.0000 mg/m2 | Freq: Once | INTRAVENOUS | Status: AC
  Administered 2023-04-07: 780 mg via INTRAVENOUS
  Filled 2023-04-07: qty 25

## 2023-04-07 MED ORDER — SODIUM CHLORIDE 0.9 % IV SOLN: Freq: Once | INTRAVENOUS | Status: AC

## 2023-04-07 MED ORDER — ATROPINE SULFATE 1 MG/ML IV SOLN
0.5000 mg | Freq: Once | INTRAVENOUS | Status: AC | PRN
  Administered 2023-04-07: 0.5 mg via INTRAVENOUS
  Filled 2023-04-07: qty 1

## 2023-04-07 MED ORDER — FLUOROURACIL CHEMO INJECTION 5 GM/100ML
2400.0000 mg/m2 | INTRAVENOUS | Status: DC
  Administered 2023-04-07: 5000 mg via INTRAVENOUS
  Filled 2023-04-07: qty 100

## 2023-04-07 MED ORDER — SODIUM CHLORIDE 0.9% FLUSH
10.0000 mL | Freq: Once | INTRAVENOUS | Status: AC
  Administered 2023-04-07: 10 mL

## 2023-04-07 MED ORDER — HEPARIN SOD (PORK) LOCK FLUSH 100 UNIT/ML IV SOLN: 500.0000 [IU] | Freq: Once | INTRAVENOUS | Status: DC | PRN

## 2023-04-07 MED ORDER — PALONOSETRON HCL INJECTION 0.25 MG/5ML
0.2500 mg | Freq: Once | INTRAVENOUS | Status: AC
Start: 2023-04-07 — End: 2023-04-07
  Administered 2023-04-07: 0.25 mg via INTRAVENOUS
  Filled 2023-04-07: qty 5

## 2023-04-07 MED ORDER — SODIUM CHLORIDE 0.9 % IV SOLN
180.0000 mg/m2 | Freq: Once | INTRAVENOUS | Status: AC
  Administered 2023-04-07: 360 mg via INTRAVENOUS
  Filled 2023-04-07: qty 13

## 2023-04-07 NOTE — Progress Notes (Signed)
[facility]   [phone] [phone]   Clinic Follow up Note   Patient Care Team: Ralene Ok, MD as PCP - General (Internal Medicine) Radonna Ricker, RN (Inactive) as Oncology Nurse Navigator Malachy Mood, MD as Consulting Physician (Oncology) Jeani Hawking, MD as Consulting Physician (Gastroenterology) Yates Decamp, MD as Consulting Physician (Cardiology)  Date of Service:  04/07/2023  CHIEF COMPLAINT: f/u of colon cancer  CURRENT THERAPY:  FOLFIRI and bevacizumab every 2 weeks  Oncology History   metastatic colon 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 Liver biopsy 01/08/20 confirmed metastasis from primary colorectal cancer. PET scan showed hypermetabolism to known liver mets, diffuse thoracic and abdominal lymphadenopathy, a 1.8 cm LUL pulmonary nodule, and splenic flexure of colon. -treated with first line FOLFOX 01/25/20 - 10/02/20, Vectibix added with C2. Oxali discontinued after C18 due to reaction. -switched to Xeloda 10/21/20, dose adjusted due to significant skin toxicity -due to cancer progression, treatment has been changed to FOLFIRI and bevacizumab on 03/25/22, she is overall tolerating well -will repeat CT scan after next cycle chemo  -Restaging CT scan from 06/16/2022 showed stable to mild decrease in size of treated liver metastasis. No new lesions -She is tolerating chemotherapy well overall, will continue -Her restaging CT scan on August 31, 2022 showed stable liver metastasis, and a stable 7 mm left lung nodule, indeterminate.  I personally reviewed the scan images with patient -Will continue current therapy.  We discussed the option of maintenance therapy with irinotecan and bevacizumab in future.  She is tolerating current FOLFIRI and bevacizumab well, will continue. -Restaging CT 03/24/2023 showed stable  disease, will continue chemo     Assessment and Plan    Metastatic Colon Cancer Follow-up for metastatic colon cancer. Symptoms well-managed with no new issues. Mild anemia (hemoglobin 11.8) consistent with previous levels. Slightly elevated white count without infection signs. Tumor markers stable. CT scan (03/24/2023) shows stable disease with calcified liver lesion and some lymph nodes, no new lesions. On chemotherapy for over three years, tolerating well with occasional nausea. No significant issues with chemotherapy pump. Better-than-average condition likely due to antibody therapy candidacy. Discussed continuation of current chemotherapy regimen due to good tolerance and stable disease. Informed about the option to discontinue the pump if side effects worsen. Explained that antibody therapy candidates tend to respond better to chemotherapy and have longer survival rates. - Continue current chemotherapy regimen every two weeks - Schedule next infusion for 10:30 AM - Request more cycles to be scheduled in the next few months - Allow for a break in treatment if plans to travel in May - Continue taking Gas X, Miralax, and Pepcid as needed  Chemotherapy-Induced Neuropathy Persistent numbness and occasional tingling without functional impairment in the hands. - Monitor symptoms and adjust treatment if neuropathy worsens  Constipation Intermittent constipation managed with magnesium supplementation. - Continue using magnesium as needed for constipation  Plan -Lab reviewed, adequate for treatment, will proceed to chemotherapy today and continue every 2 weeks -Follow-up in 2 weeks     SUMMARY OF ONCOLOGIC HISTORY: Oncology History  metastatic colon cancer to liver  06/20/2019 Procedure   Colonoscopy by Dr Meridee Score 06/20/19  IMPRESSION -Seven 3 to 10 mm polyps in the sigmoid colon, in the transverse colon and in the escending colon, removed with a cold snare. Resected and retrireved.   -One 20mm  polyp in the descending colon. Biopsies. Tattoes.  -Mediaum sized lipoma in the ascending colon.   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 See Comment B. Colon, Ascending, polyp, Polypectomy:  -TUBULAR ADENOMA -No high grade dysplasia or malignancy.  C. Colon, TRansverse, Polyo, polectomy:  -TUBULAR ADENOMA -No high grade dysplasia or malignancy.  D. Colon, Sigmoid, Polyp, Polypectomy:  -HYPERPLASTIC POLYP   11/07/2019 Imaging   US Abdomen 11/07/19  IMPRESSION: 1. Two solid masses in the liver are nonspecific. Recommend MRI abdomen with and without contrast for further evaluation.   12/15/2019 Imaging   MRI Abdomen 12/15/19  IMPRESSION: 1. There are two large masses in the liver with appearance favoring metastatic disease or hepatocellular carcinoma or cholangiocarcinoma. A benign etiology is highly unlikely given the enhancement pattern and associated adenopathy. 2. Considerable porta hepatis and retroperitoneal adenopathy. Some of the confluent porta hepatis tumor is potentially infiltrative and abuts the pancreatic body along its right upper margin, making it difficult to completely exclude the possibility of pancreatic adenocarcinoma primary. Possibilities helpful in further workup might include tissue diagnosis, endoscopic ultrasound, or nuclear medicine PET-CT. 3. Pancreas divisum. 4. Lumbar spondylosis and degenerative disc disease. 5. Despite efforts by the technologist and patient, motion artifact is present on today's exam and could not be eliminated. This reduces exam sensitivity and specificity.   12/22/2019 Procedure   Upper Endoscopy by Dr Elnoria Howard 12/22/19  IMPRESSION - One lymph node was visualized and measured in the porta hepatis region. Fine needle aspiration performed    12/22/2019 Initial Biopsy   A. LIVER, PORTA HEPATIS MASS, FINE NEEDLE 12/22/19 ASPIRATION:   FINAL MICROSCOPIC DIAGNOSIS:  - Malignant cells consistent with metastatic adenocarcinoma   01/01/2020 Initial Diagnosis   Intrahepatic cholangiocarcinoma (HCC)   01/08/2020 Initial Biopsy   FINAL MICROSCOPIC DIAGNOSIS:   A. LIVER, LEFT LOBE, BIOPSY:  - Metastatic adenocarcinoma, consistent with a colorectal primary.  See  comment      COMMENT:   Immunohistochemical stains show the tumor cells are positive for CK20  and CDX2 but negative for CK7, consistent with above interpretation.  Dr. Mosetta Putt was notified on 01/10/2020   01/08/2020 Genetic Testing   [name]  [name] wildtype and [name]/[name] mutations which make her eligible for target biological agent Vectibix.   01/24/2020 Procedure   PAC placed 01/24/20   01/25/2020 -  Chemotherapy   first line FOLFOX starting 01/25/2020, Vextibix  added with C2 (02/06/20)   02/06/2020 - 02/06/2022 Chemotherapy   Patient is on Treatment Plan : COLORECTAL Panitumumab q14d ([name] - Type Gene Only)     06/20/2020 Imaging   CT CAP  IMPRESSION: 1. Continued interval reduction in size and conspicuity of a subsolid nodule of the peripheral left upper lobe. 2. Unchanged prominent pretracheal and subcarinal lymph nodes. 3. Redemonstrated partially calcified low-attenuation liver masses, slightly decreased in size compared to prior examination. 4. Slight interval decrease in size of a portacaval lymph node or conglomerate and retroperitoneal lymph nodes. 5. Findings are consistent with continued treatment response of nodal, pulmonary, and hepatic metastatic disease. 6. Coronary artery disease.   Aortic Atherosclerosis (ICD10-I70.0).     10/09/2020 Imaging   IMPRESSION: 1. Slight decrease in size of dominant liver mass with smaller liver mass within 1-2 mm of prior measurement. 2. Stable appearance of celiac lymph and retroperitoneal lymph nodes. Dominant node with calcification in the gastrohepatic ligament as described. 3.  Continued decrease in size of LEFT upper  lobe nodule. 4. Three-vessel coronary artery calcification. 5. Aortic atherosclerosis.   03/25/2022 -  Chemotherapy   Patient is on Treatment Plan : COLORECTAL FOLFIRI + Bevacizumab q14d     08/31/2022 Imaging    IMPRESSION: 1. Unchanged, densely calcified liver lesions, consistent with treated metastatic disease. 2. Unchanged, irregular subpleural opacity of the peripheral left upper lobe. 3. Unchanged subcentimeter gastrohepatic ligament lymph node. 4. No evidence of new metastatic disease in the chest, abdomen, or pelvis. 5. Status post hysterectomy. 6. Coronary artery disease. 7. Aortic valve calcifications. Correlate for echocardiographic evidence of aortic valve dysfunction.        Discussed the use of AI scribe software for clinical note transcription with the patient, who gave verbal consent to proceed.  History of Present Illness   The patient, a 68 year old with metastatic colon cancer, presents for a routine follow-up. She reports no new symptoms and feels about the same as her last visit. She continues to experience neuropathy, characterized by numbness and occasional tingling, but denies any functional impairment. She reports good appetite and no changes in bowel movements, although she occasionally takes magnesia for constipation. She denies any other concerns or issues.         All other systems were reviewed with the patient and are negative.  MEDICAL HISTORY:  Past Medical History:  Diagnosis Date   Arthritis    Diabetes mellitus without complication (HCC)    Hypertension    met colon ca to liver 01/2020    SURGICAL HISTORY: Past Surgical History:  Procedure Laterality Date   ABDOMINAL HYSTERECTOMY     ANTERIOR AND POSTERIOR REPAIR WITH SACROSPINOUS FIXATION N/A 07/16/2016   Procedure: ANTERIOR AND POSTERIOR REPAIR WITH SACROSPINOUS FIXATION;  Surgeon: Harold Hedge, MD;  Location: [facility];  Service: Gynecology;   Laterality: N/A;   CYSTOSCOPY  07/16/2016   Procedure: CYSTOSCOPY;  Surgeon: Harold Hedge, MD;  Location: [facility];  Service: Gynecology;;   ESOPHAGOGASTRODUODENOSCOPY (EGD) WITH PROPOFOL N/A 12/22/2019   Procedure: ESOPHAGOGASTRODUODENOSCOPY (EGD) WITH PROPOFOL;  Surgeon: Jeani Hawking, MD;  Location: [facility];  Service: Endoscopy;  Laterality: N/A;   FINE NEEDLE ASPIRATION N/A 12/22/2019   Procedure: FINE NEEDLE ASPIRATION (FNA) LINEAR;  Surgeon: Jeani Hawking, MD;  Location: [facility];  Service: Endoscopy;  Laterality: N/A;   IR IMAGING GUIDED PORT INSERTION  01/24/2020   LAPAROSCOPIC VAGINAL HYSTERECTOMY WITH SALPINGECTOMY Bilateral 07/16/2016   Procedure: LAPAROSCOPIC ASSISTED VAGINAL HYSTERECTOMY WITH SALPINGECTOMY;  Surgeon: Harold Hedge, MD;  Location: [facility];  Service: Gynecology;  Laterality: Bilateral;   MYOMECTOMY ABDOMINAL APPROACH     THYROID SURGERY     tyroid     UPPER ESOPHAGEAL ENDOSCOPIC ULTRASOUND (EUS) N/A 12/22/2019   Procedure: UPPER ESOPHAGEAL ENDOSCOPIC ULTRASOUND (EUS);  Surgeon: Jeani Hawking, MD;  Location: [facility];  Service: Endoscopy;  Laterality: N/A;    I have reviewed the social history and family history with the patient and they are unchanged from previous note.  ALLERGIES:  is allergic to oxaliplatin.  MEDICATIONS:  Current Outpatient Medications  Medication Sig Dispense Refill   atorvastatin (LIPITOR) 20 MG tablet Take 20 mg by mouth at bedtime.   1   hydrochlorothiazide (MICROZIDE) 12.5 MG capsule Take 12.5 mg by mouth daily.     KLOR-CON M20 20 MEQ tablet TAKE 1 TABLET TWICE A DAY 180 tablet 3   lidocaine-prilocaine (EMLA) cream Apply 1 Application topically as needed. 30 g 1   magic mouthwash w/lidocaine SOLN Take 5 mLs by mouth 4 (four) times daily.  475 mL 2   ondansetron (ZOFRAN) 8 MG tablet Take 1 tablet (8 mg total) by mouth every 8 (eight) hours as needed. 20 tablet 0   pantoprazole (PROTONIX) 20 MG tablet Take 1 tablet (20 mg  total) by mouth daily. 30 tablet 1   prochlorperazine (COMPAZINE) 10 MG tablet Take 1 tablet (10 mg total) by mouth every 6 (six) hours as needed for nausea or vomiting. 30 tablet 2   spironolactone (ALDACTONE) 25 MG tablet Take 25 mg by mouth daily with breakfast.     No current facility-administered medications for this visit.   Facility-Administered Medications Ordered in Other Visits  Medication Dose Route Frequency Provider Last Rate Last Admin   fluorouracil (ADRUCIL) 5,000 mg in sodium chloride 0.9 % 150 mL chemo infusion  2,400 mg/m2 (Treatment Plan Recorded) Intravenous 1 day or 1 dose Malachy Mood, MD   Infusion Verify at 04/07/23 1411   heparin lock flush 100 unit/mL  500 Units Intracatheter Once PRN Malachy Mood, MD       sodium chloride flush (NS) 0.9 % injection 10 mL  10 mL Intracatheter PRN Malachy Mood, MD        PHYSICAL EXAMINATION: ECOG PERFORMANCE STATUS: 0 - Asymptomatic  Vitals:   04/07/23 0954 04/07/23 0955  BP: (!) 150/89 (!) 142/76  Pulse: 63   Resp: 17   Temp: 97.6 F (36.4 C)   SpO2: 100%    Wt Readings from Last 3 Encounters:  04/07/23 181 lb 14.4 oz (82.5 kg)  03/11/23 182 lb (82.6 kg)  02/24/23 182 lb 1.6 oz (82.6 kg)     GENERAL:alert, no distress and comfortable SKIN: skin color, texture, turgor are normal, no rashes or significant lesions EYES: normal, Conjunctiva are pink and non-injected, sclera clear NECK: supple, thyroid normal size, non-tender, without nodularity LYMPH:  no palpable lymphadenopathy in the cervical, axillary  LUNGS: clear to auscultation and percussion with normal breathing effort HEART: regular rate & rhythm and no murmurs and no lower extremity edema ABDOMEN:abdomen soft, non-tender and normal bowel sounds Musculoskeletal:no cyanosis of digits and no clubbing  NEURO: alert & oriented x 3 with fluent speech, no focal motor/sensory deficits   LABORATORY DATA:  I have reviewed the data as listed    Latest Ref Rng & Units  04/07/2023    9:23 AM 03/24/2023    7:28 AM 03/11/2023    7:45 AM  CBC  WBC 4.0 - 10.5 K/uL 14.7  9.9  8.9   Hemoglobin 12.0 - 15.0 g/dL 09.8  11.9  14.7   Hematocrit 36.0 - 46.0 % 36.9  37.4  37.9   Platelets 150 - 400 K/uL 259  270  268         Latest Ref Rng & Units 04/07/2023    9:23 AM 03/24/2023    7:28 AM 03/11/2023    7:45 AM  CMP  Glucose 70 - 99 mg/dL 829  562  130   BUN 8 - 23 mg/dL 11  10  10    Creatinine 0.44 - 1.00 mg/dL 8.65  7.84  6.96   Sodium 135 - 145 mmol/L 139  139  139   Potassium 3.5 - 5.1 mmol/L 3.6  3.6  3.8   Chloride 98 - 111 mmol/L 106  105  104   CO2 22 - 32 mmol/L 27  27  28    Calcium 8.9 - 10.3 mg/dL 9.4  9.6  9.6   Total Protein 6.5 - 8.1 g/dL 7.3  7.3  7.4   Total Bilirubin 0.0 - 1.2 mg/dL 0.2  0.3  0.2   Alkaline Phos 38 - 126 U/L 129  116  112   AST 15 - 41 U/L 12  12  15    ALT 0 - 44 U/L 10  12  16        RADIOGRAPHIC STUDIES: I have personally reviewed the radiological images as listed and agreed with the findings in the report. No results found.    Orders Placed This Encounter  Procedures   CBC with Differential (Cancer Center Only)    Standing Status:   Future    Expected Date:   05/05/2023    Expiration Date:   05/04/2024   CMP (Cancer Center only)    Standing Status:   Future    Expected Date:   05/05/2023    Expiration Date:   05/04/2024   Total Protein, Urine dipstick    Standing Status:   Future    Expected Date:   05/05/2023    Expiration Date:   05/04/2024   CBC with Differential (Cancer Center Only)    Standing Status:   Future    Expected Date:   05/19/2023    Expiration Date:   05/18/2024   CMP (Cancer Center only)    Standing Status:   Future    Expected Date:   05/19/2023    Expiration Date:   05/18/2024   Total Protein, Urine dipstick    Standing Status:   Future    Expected Date:   05/19/2023    Expiration Date:   05/18/2024   CBC with Differential (Cancer Center Only)    Standing Status:   Future    Expected Date:    06/02/2023    Expiration Date:   06/01/2024   CMP (Cancer Center only)    Standing Status:   Future    Expected Date:   06/02/2023    Expiration Date:   06/01/2024   Total Protein, Urine dipstick    Standing Status:   Future    Expected Date:   06/02/2023    Expiration Date:   06/01/2024   CBC with Differential (Cancer Center Only)    Standing Status:   Future    Expected Date:   06/16/2023    Expiration Date:   06/15/2024   CMP (Cancer Center only)    Standing Status:   Future    Expected Date:   06/16/2023    Expiration Date:   06/15/2024   Total Protein, Urine dipstick    Standing Status:   Future    Expected Date:   06/16/2023    Expiration Date:   06/15/2024   CBC with Differential (Cancer Center Only)    Standing Status:   Future    Expected Date:   06/30/2023    Expiration Date:   06/29/2024   CMP (Cancer Center only)    Standing Status:   Future    Expected Date:   06/30/2023    Expiration Date:   06/29/2024   Total Protein, Urine dipstick    Standing Status:   Future    Expected Date:   06/30/2023    Expiration Date:   06/29/2024   All questions were answered. The patient knows to call the clinic with any problems, questions or concerns. No barriers to learning was detected. The total time spent in the appointment was 25 minutes.     Malachy Mood, MD 04/07/2023

## 2023-04-07 NOTE — Patient Instructions (Signed)
[facility] - A DEPT OF [facility]  Discharge Instructions: Thank you for choosing [facility] to provide your oncology and hematology care.   If you have a lab appointment with the Cancer Center, please go directly to the Cancer Center and check in at the registration area.   Wear comfortable clothing and clothing appropriate for easy access to any Portacath or PICC line.   We strive to give you quality time with your provider. You may need to reschedule your appointment if you arrive late (15 or more minutes).  Arriving late affects you and other patients whose appointments are after yours.  Also, if you miss three or more appointments without notifying the office, you may be dismissed from the clinic at the provider's discretion.      For prescription refill requests, have your pharmacy contact our office and allow 72 hours for refills to be completed.    Today you received the following chemotherapy and/or immunotherapy agents: Mvasi, Irinotecan, Leucovorin, Fluorouracil      To help prevent nausea and vomiting after your treatment, we encourage you to take your nausea medication as directed.  BELOW ARE SYMPTOMS THAT SHOULD BE REPORTED IMMEDIATELY: *FEVER GREATER THAN 100.4 F (38 C) OR HIGHER *CHILLS OR SWEATING *NAUSEA AND VOMITING THAT IS NOT CONTROLLED WITH YOUR NAUSEA MEDICATION *UNUSUAL SHORTNESS OF BREATH *UNUSUAL BRUISING OR BLEEDING *URINARY PROBLEMS (pain or burning when urinating, or frequent urination) *BOWEL PROBLEMS (unusual diarrhea, constipation, pain near the anus) TENDERNESS IN MOUTH AND THROAT WITH OR WITHOUT PRESENCE OF ULCERS (sore throat, sores in mouth, or a toothache) UNUSUAL RASH, SWELLING OR PAIN  UNUSUAL VAGINAL DISCHARGE OR ITCHING   Items with * indicate a potential emergency and should be followed up as soon as possible or go to the Emergency Department if any problems should occur.  Please show the  CHEMOTHERAPY ALERT CARD or IMMUNOTHERAPY ALERT CARD at check-in to the Emergency Department and triage nurse.  Should you have questions after your visit or need to cancel or reschedule your appointment, please contact [facility] - A DEPT OF [facility]  Dept: [phone]  and follow the prompts.  Office hours are 8:00 a.m. to 4:30 p.m. Monday - Friday. Please note that voicemails left after 4:00 p.m. may not be returned until the following business day.  We are closed weekends and major holidays. You have access to a nurse at all times for urgent questions. Please call the main number to the clinic Dept: [phone] and follow the prompts.   For any non-urgent questions, you may also contact your provider using MyChart. We now offer e-Visits for anyone 47 and older to request care online for non-urgent symptoms. For details visit [email or web address].   Also download the MyChart app! Go to the app store, search "MyChart", open the app, select [place], and log in with your MyChart username and password.

## 2023-04-09 ENCOUNTER — Inpatient Hospital Stay: Payer: Medicare (Managed Care)

## 2023-04-09 ENCOUNTER — Other Ambulatory Visit: Payer: Self-pay

## 2023-04-09 VITALS — BP 133/79 | HR 67 | Temp 98.7°F | Resp 18

## 2023-04-09 DIAGNOSIS — Z5112 Encounter for antineoplastic immunotherapy: Secondary | ICD-10-CM | POA: Diagnosis not present

## 2023-04-09 DIAGNOSIS — C221 Intrahepatic bile duct carcinoma: Secondary | ICD-10-CM

## 2023-04-09 MED ORDER — SODIUM CHLORIDE 0.9% FLUSH
10.0000 mL | INTRAVENOUS | Status: DC | PRN
  Administered 2023-04-09: 10 mL

## 2023-04-09 MED ORDER — PEGFILGRASTIM-CBQV 6 MG/0.6ML ~~LOC~~ SOSY
6.0000 mg | PREFILLED_SYRINGE | Freq: Once | SUBCUTANEOUS | Status: AC
  Administered 2023-04-09: 6 mg via SUBCUTANEOUS
  Filled 2023-04-09: qty 0.6

## 2023-04-09 MED ORDER — HEPARIN SOD (PORK) LOCK FLUSH 100 UNIT/ML IV SOLN
500.0000 [IU] | Freq: Once | INTRAVENOUS | Status: AC | PRN
  Administered 2023-04-09: 500 [IU]

## 2023-04-21 ENCOUNTER — Inpatient Hospital Stay: Payer: Medicare (Managed Care) | Admitting: Hematology

## 2023-04-21 ENCOUNTER — Other Ambulatory Visit: Payer: Self-pay | Admitting: Nurse Practitioner

## 2023-04-21 ENCOUNTER — Telehealth: Payer: Self-pay | Admitting: Hematology

## 2023-04-21 ENCOUNTER — Telehealth: Payer: Self-pay

## 2023-04-21 ENCOUNTER — Inpatient Hospital Stay: Payer: Medicare (Managed Care)

## 2023-04-21 ENCOUNTER — Inpatient Hospital Stay: Payer: Medicare (Managed Care) | Attending: Hematology

## 2023-04-21 DIAGNOSIS — C221 Intrahepatic bile duct carcinoma: Secondary | ICD-10-CM

## 2023-04-21 NOTE — Assessment & Plan Note (Deleted)
 MSS, [name]/[name] wildtype -diagnosed 12/2019 by porta hepatis LN biopsy during EUS for work up of abdominal pain and liver lesions seen on [place] and MRI. Liver biopsy 01/08/20 confirmed metastasis from primary colorectal cancer. PET scan showed hypermetabolism to known liver mets, diffuse thoracic and abdominal lymphadenopathy, a 1.8 cm LUL pulmonary nodule, and splenic flexure of colon. -treated with first line FOLFOX 01/25/20 - 10/02/20, Vectibix added with C2. Oxali discontinued after C18 due to reaction. -switched to Xeloda 10/21/20, dose adjusted due to significant skin toxicity -due to cancer progression, treatment has been changed to FOLFIRI and bevacizumab on 03/25/22, she is overall tolerating well -will repeat CT scan after next cycle chemo  -Restaging CT scan from 06/16/2022 showed stable to mild decrease in size of treated liver metastasis. No new lesions -She is tolerating chemotherapy well overall, will continue -Her restaging CT scan on August 31, 2022 showed stable liver metastasis, and a stable 7 mm left lung nodule, indeterminate.  I personally reviewed the scan images with patient -Will continue current therapy.  We discussed the option of maintenance therapy with irinotecan and bevacizumab in future.  She is tolerating current FOLFIRI and bevacizumab well, will continue. -Restaging CT 03/24/2023 showed stable disease, will continue chemo

## 2023-04-21 NOTE — Progress Notes (Signed)
Updated treatment plan to reflect requested changes

## 2023-04-27 ENCOUNTER — Other Ambulatory Visit: Payer: Self-pay

## 2023-05-04 ENCOUNTER — Other Ambulatory Visit: Payer: Self-pay | Admitting: Nurse Practitioner

## 2023-05-04 DIAGNOSIS — C221 Intrahepatic bile duct carcinoma: Secondary | ICD-10-CM

## 2023-05-04 NOTE — Assessment & Plan Note (Signed)
 MSS, [name]/[name] wildtype -diagnosed 12/2019 by porta hepatis LN biopsy during EUS for work up of abdominal pain and liver lesions seen on [place] and MRI. Liver biopsy 01/08/20 confirmed metastasis from primary colorectal cancer. PET scan showed hypermetabolism to known liver mets, diffuse thoracic and abdominal lymphadenopathy, a 1.8 cm LUL pulmonary nodule, and splenic flexure of colon. -treated with first line FOLFOX 01/25/20 - 10/02/20, Vectibix added with C2. Oxali discontinued after C18 due to reaction. -switched to Xeloda 10/21/20, dose adjusted due to significant skin toxicity -due to cancer progression, treatment has been changed to FOLFIRI and bevacizumab on 03/25/22, she is overall tolerating well -will repeat CT scan after next cycle chemo  -Restaging CT scan from 06/16/2022 showed stable to mild decrease in size of treated liver metastasis. No new lesions -She is tolerating chemotherapy well overall, will continue -Her restaging CT scan on August 31, 2022 showed stable liver metastasis, and a stable 7 mm left lung nodule, indeterminate.  -Will continue current therapy.  We discussed the option of maintenance therapy with irinotecan and bevacizumab in future.  She is tolerating current FOLFIRI and bevacizumab well, will continue. -Restaging CT 03/24/2023 showed stable disease, will continue chemo  -05/05/2023 -proceed with chemo FOLFIRI and bevacizumab today.

## 2023-05-04 NOTE — Progress Notes (Unsigned)
 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 Liver biopsy 01/08/20 confirmed metastasis from primary colorectal cancer. PET scan showed hypermetabolism to known liver mets, diffuse thoracic and abdominal lymphadenopathy, a 1.8 cm LUL pulmonary nodule, and splenic flexure of colon. -treated with first line FOLFOX 01/25/20 - 10/02/20, Vectibix added with C2. Oxali discontinued after C18 due to reaction. -switched to Xeloda 10/21/20, dose adjusted due to significant skin toxicity -due to cancer progression, treatment has been changed to FOLFIRI and bevacizumab on 03/25/22, she is overall tolerating well -will repeat CT scan after next cycle chemo  -Restaging CT scan from 06/16/2022 showed stable to mild decrease in size of treated liver metastasis. No new lesions -She is tolerating chemotherapy well overall, will continue -Her restaging CT scan on August 31, 2022 showed stable liver metastasis, and a stable 7 mm left lung nodule, indeterminate.  -Will continue current therapy.  We discussed the option of maintenance therapy with irinotecan and bevacizumab in future.  She is tolerating current FOLFIRI and bevacizumab well, will continue. -Restaging CT 03/24/2023 showed stable disease, will continue chemo  -05/05/2023 -proceed with chemo FOLFIRI and bevacizumab today.   Chemotherapy related fatigue Stable.  Patient still able to participate in normal  activities.  Chemotherapy related neuropathy Does have some numbness in fingertips and toes. -This is baseline with no changes.  Mild constipation Will use milk of magnesia or MiraLAX which help   Plan:  Labs reviewed with patient.  Stable and mild anemia. Other labs are stable and within normal limits.  Urine protein is negative. Patient condition satisfactory for treatment today. Proceed with FOLFIRI and bevacizumab today. Labs/flush, follow-up, and treatment in 2 weeks as scheduled.    The patient understands the plans discussed today and is in agreement with them.  She knows to contact our office if she develops concerns prior to her next appointment.  I provided 25 minutes of face-to-face time during this encounter and > 50% was spent counseling as documented under my assessment and plan.    Carlean Jews, NP  [facility] - A DEPT OF [facility] [address] [place] [address] Dept: [phone] Dept Fax: [phone]   No orders of the defined types were placed in this encounter.     CHIEF COMPLAINT:  CC: Metastatic colon cancer  Current Treatment: FOLFIRI and bevacizumab every 2 weeks  INTERVAL HISTORY:  Lindsay [name] is here today for repeat clinical assessment.  She has been tolerating chemotherapy well.  Restaging CT scan from 03/24/2023 showed stable disease.  She reports tolerating chemo well.  States she is eating okay.  She states she had single episode of vomiting and diarrhea about 3 weeks ago.  Has not had problems since then.  Does have stable fatigue.  Still able to participate in normal activities.  Baseline neuropathy in fingers and toes.  Mild constipation which are controlled with milk of magnesia or MiraLAX.  She denies chest pain,  chest pressure, or shortness of breath. She denies headaches or visual disturbances. She denies abdominal pain, nausea, vomiting, or changes in bowel or  bladder habits.  She denies fevers or chills. She denies pain. Her appetite is good. Her weight has decreased 1 pounds over last 2 weeks .  I have reviewed the past medical history, past surgical history, social history and family history with the patient and they are unchanged from previous note.  ALLERGIES:  is allergic to oxaliplatin.  MEDICATIONS:  No current facility-administered medications for this visit.   Current Outpatient Medications  Medication Sig Dispense Refill   atorvastatin (LIPITOR) 20 MG tablet Take 20 mg by mouth at bedtime.   1   hydrochlorothiazide (MICROZIDE) 12.5 MG capsule Take 12.5 mg by mouth daily.     KLOR-CON M20 20 MEQ tablet TAKE 1 TABLET TWICE A DAY 180 tablet 3   lidocaine-prilocaine (EMLA) cream Apply 1 Application topically as needed. 30 g 1   magic mouthwash w/lidocaine SOLN Take 5 mLs by mouth 4 (four) times daily. 475 mL 2   ondansetron (ZOFRAN) 8 MG tablet Take 1 tablet (8 mg total) by mouth every 8 (eight) hours as needed. 20 tablet 0   pantoprazole (PROTONIX) 20 MG tablet Take 1 tablet (20 mg total) by mouth daily. 30 tablet 1   prochlorperazine (COMPAZINE) 10 MG tablet Take 1 tablet (10 mg total) by mouth every 6 (six) hours as needed for nausea or vomiting. 30 tablet 2   spironolactone (ALDACTONE) 25 MG tablet Take 25 mg by mouth daily with breakfast.     Facility-Administered Medications Ordered in Other Visits  Medication Dose Route Frequency Provider Last Rate Last Admin   fluorouracil (ADRUCIL) 5,000 mg in sodium chloride 0.9 % 150 mL chemo infusion  2,400 mg/m2 (Treatment Plan Recorded) Intravenous 1 day or 1 dose Malachy Mood, MD       heparin lock flush 100 unit/mL  500 Units Intracatheter Once PRN Malachy Mood, MD       sodium chloride flush (NS) 0.9 % injection 10 mL  10 mL Intracatheter PRN Malachy Mood, MD        HISTORY OF PRESENT ILLNESS:   Oncology History  metastatic colon cancer to liver  06/20/2019 Procedure   Colonoscopy by Dr  Meridee Score 06/20/19  IMPRESSION -Seven 3 to 10 mm polyps in the sigmoid colon, in the transverse colon and in the escending colon, removed with a cold snare. Resected and retrireved.  -One 20mm polyp in the descending colon. Biopsies. Tattoes.  -Mediaum sized lipoma in the ascending colon.   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 See Comment B. Colon, Ascending, polyp, Polypectomy:  -TUBULAR ADENOMA -No high grade dysplasia or malignancy.  C. Colon, TRansverse, Polyo, polectomy:  -TUBULAR ADENOMA -No high grade dysplasia or malignancy.  D. Colon, Sigmoid, Polyp, Polypectomy:  -HYPERPLASTIC POLYP   11/07/2019 Imaging   US Abdomen 11/07/19  IMPRESSION: 1. Two solid masses in the liver are nonspecific. Recommend MRI abdomen with and without contrast for further evaluation.   12/15/2019 Imaging   MRI Abdomen 12/15/19  IMPRESSION: 1. There are two large masses in the liver with appearance favoring metastatic disease or hepatocellular carcinoma or cholangiocarcinoma. A benign etiology is highly unlikely given the enhancement pattern and associated adenopathy. 2. Considerable porta hepatis and retroperitoneal adenopathy. Some of the confluent porta hepatis tumor is potentially infiltrative and abuts the pancreatic body along its right upper margin,  making it difficult to completely exclude the possibility of pancreatic adenocarcinoma primary. Possibilities helpful in further workup might include tissue diagnosis, endoscopic ultrasound, or nuclear medicine PET-CT. 3. Pancreas divisum. 4. Lumbar spondylosis and degenerative disc disease. 5. Despite efforts by the technologist and patient, motion artifact is present on today's exam and could not be eliminated. This reduces exam sensitivity and specificity.   12/22/2019 Procedure   Upper Endoscopy by Dr Elnoria Howard 12/22/19  IMPRESSION - One lymph node was  visualized and measured in the porta hepatis region. Fine needle aspiration performed    12/22/2019 Initial Biopsy   A. LIVER, PORTA HEPATIS MASS, FINE NEEDLE 12/22/19 ASPIRATION:  FINAL MICROSCOPIC DIAGNOSIS:  - Malignant cells consistent with metastatic adenocarcinoma   01/01/2020 Initial Diagnosis   Intrahepatic cholangiocarcinoma (HCC)   01/08/2020 Initial Biopsy   FINAL MICROSCOPIC DIAGNOSIS:   A. LIVER, LEFT LOBE, BIOPSY:  - Metastatic adenocarcinoma, consistent with a colorectal primary.  See  comment      COMMENT:   Immunohistochemical stains show the tumor cells are positive for CK20  and CDX2 but negative for CK7, consistent with above interpretation.  Dr. Mosetta Putt was notified on 01/10/2020   01/08/2020 Genetic Testing   [name]  [name] wildtype and [name]/[name] mutations which make her eligible for target biological agent Vectibix.   01/24/2020 Procedure   PAC placed 01/24/20   01/25/2020 -  Chemotherapy   first line FOLFOX starting 01/25/2020, Vextibix  added with C2 (02/06/20)   02/06/2020 - 02/06/2022 Chemotherapy   Patient is on Treatment Plan : COLORECTAL Panitumumab q14d ([name] Wild - Type Gene Only)     06/20/2020 Imaging   CT CAP  IMPRESSION: 1. Continued interval reduction in size and conspicuity of a subsolid nodule of the peripheral left upper lobe. 2. Unchanged prominent pretracheal and subcarinal lymph nodes. 3. Redemonstrated partially calcified low-attenuation liver masses, slightly decreased in size compared to prior examination. 4. Slight interval decrease in size of a portacaval lymph node or conglomerate and retroperitoneal lymph nodes. 5. Findings are consistent with continued treatment response of nodal, pulmonary, and hepatic metastatic disease. 6. Coronary artery disease.   Aortic Atherosclerosis (ICD10-I70.0).     10/09/2020 Imaging   IMPRESSION: 1. Slight decrease in size of dominant liver mass with smaller liver mass within 1-2  mm of prior measurement. 2. Stable appearance of celiac lymph and retroperitoneal lymph nodes. Dominant node with calcification in the gastrohepatic ligament as described. 3. Continued decrease in size of LEFT upper lobe nodule. 4. Three-vessel coronary artery calcification. 5. Aortic atherosclerosis.   03/25/2022 -  Chemotherapy   Patient is on Treatment Plan : COLORECTAL FOLFIRI + Bevacizumab q14d     08/31/2022 Imaging    IMPRESSION: 1. Unchanged, densely calcified liver lesions, consistent with treated metastatic disease. 2. Unchanged, irregular subpleural opacity of the peripheral left upper lobe. 3. Unchanged subcentimeter gastrohepatic ligament lymph node. 4. No evidence of new metastatic disease in the chest, abdomen, or pelvis. 5. Status post hysterectomy. 6. Coronary artery disease. 7. Aortic valve calcifications. Correlate for echocardiographic evidence of aortic valve dysfunction.         REVIEW OF SYSTEMS:   Constitutional: Denies fevers, chills or abnormal weight loss.  Mild and stable fatigue. Eyes: Denies blurriness of vision Ears, nose, mouth, throat, and face: Denies mucositis or sore throat Respiratory: Denies cough, dyspnea or wheezes Cardiovascular: Denies palpitation, chest discomfort or lower extremity swelling Gastrointestinal:  Denies nausea, heartburn or change in bowel habits Skin: Denies abnormal skin rashes Lymphatics:  Denies new lymphadenopathy or easy bruising Neurological:Denies numbness, tingling or new weaknesses Behavioral/Psych: Mood is stable, no new changes  All other systems were reviewed with the patient and are negative.   VITALS:   Today's Vitals   05/05/23 0909  BP: 118/70  Pulse: 67  Resp: (!) 21  Temp: 98.1 F (36.7 C)  TempSrc: Temporal  SpO2: 97%  Weight: 180 lb 1.6 oz (81.7 kg)   Body mass index is 29.07 kg/m.   Wt Readings from Last 3 Encounters:  05/05/23 180 lb 12.4 oz (82 kg)  05/05/23 180 lb 1.6 oz (81.7  kg)  04/07/23 181 lb 14.4 oz (82.5 kg)    Body mass index is 29.07 kg/m.  Performance status (ECOG): 1 - Symptomatic but completely ambulatory  PHYSICAL EXAM:   GENERAL:alert, no distress and comfortable SKIN: skin color, texture, turgor are normal, no rashes or significant lesions EYES: normal, Conjunctiva are pink and non-injected, sclera clear OROPHARYNX:no exudate, no erythema and lips, buccal mucosa, and tongue normal  NECK: supple, thyroid normal size, non-tender, without nodularity LYMPH:  no palpable lymphadenopathy in the cervical, axillary or inguinal LUNGS: clear to auscultation and percussion with normal breathing effort HEART: regular rate & rhythm and no murmurs and no lower extremity edema ABDOMEN:abdomen soft, non-tender and normal bowel sounds Musculoskeletal:no cyanosis of digits and no clubbing  NEURO: alert & oriented x 3 with fluent speech, no focal motor/sensory deficits  LABORATORY DATA:  I have reviewed the data as listed    Component Value Date/Time   NA 136 05/05/2023 1407   K 3.9 05/05/2023 1407   CL 103 05/05/2023 1407   CO2 23 05/05/2023 1407   GLUCOSE 155 (H) 05/05/2023 1407   BUN 12 05/05/2023 1407   CREATININE 0.62 05/05/2023 1407   CREATININE 0.63 05/05/2023 0844   CALCIUM 9.5 05/05/2023 1407   PROT 7.5 05/05/2023 0844   ALBUMIN 4.2 05/05/2023 0844   AST 14 (L) 05/05/2023 0844   ALT 13 05/05/2023 0844   ALKPHOS 109 05/05/2023 0844   BILITOT 0.3 05/05/2023 0844   GFRNONAA >60 05/05/2023 1407   GFRNONAA >60 05/05/2023 0844   GFRAA >60 07/09/2016 0836   Lab Results  Component Value Date   WBC 8.4 05/05/2023   NEUTROABS 4.1 05/05/2023   HGB 11.9 (L) 05/05/2023   HCT 38.2 05/05/2023   MCV 92.9 05/05/2023   PLT 302 05/05/2023

## 2023-05-05 ENCOUNTER — Emergency Department (HOSPITAL_COMMUNITY): Payer: Medicare (Managed Care)

## 2023-05-05 ENCOUNTER — Encounter: Payer: Self-pay | Admitting: Nurse Practitioner

## 2023-05-05 ENCOUNTER — Inpatient Hospital Stay: Payer: Medicare (Managed Care)

## 2023-05-05 ENCOUNTER — Other Ambulatory Visit: Payer: Self-pay

## 2023-05-05 ENCOUNTER — Inpatient Hospital Stay: Payer: Medicare (Managed Care) | Admitting: Nurse Practitioner

## 2023-05-05 ENCOUNTER — Encounter (HOSPITAL_COMMUNITY): Payer: Self-pay

## 2023-05-05 ENCOUNTER — Inpatient Hospital Stay (HOSPITAL_COMMUNITY)
Admission: EM | Admit: 2023-05-05 | Discharge: 2023-05-08 | DRG: 281 | Disposition: A | Discharge status: Home / Self Care | Payer: Medicare (Managed Care) | Attending: Internal Medicine | Admitting: Internal Medicine

## 2023-05-05 VITALS — BP 133/82 | HR 84 | Temp 97.7°F | Resp 18

## 2023-05-05 VITALS — BP 118/70 | HR 67 | Temp 98.1°F | Resp 21 | Wt 180.1 lb

## 2023-05-05 DIAGNOSIS — C221 Intrahepatic bile duct carcinoma: Secondary | ICD-10-CM

## 2023-05-05 DIAGNOSIS — C787 Secondary malignant neoplasm of liver and intrahepatic bile duct: Secondary | ICD-10-CM | POA: Diagnosis present

## 2023-05-05 DIAGNOSIS — I350 Nonrheumatic aortic (valve) stenosis: Secondary | ICD-10-CM | POA: Diagnosis present

## 2023-05-05 DIAGNOSIS — C19 Malignant neoplasm of rectosigmoid junction: Secondary | ICD-10-CM | POA: Insufficient documentation

## 2023-05-05 DIAGNOSIS — Q2381 Bicuspid aortic valve: Secondary | ICD-10-CM

## 2023-05-05 DIAGNOSIS — Z95828 Presence of other vascular implants and grafts: Secondary | ICD-10-CM

## 2023-05-05 DIAGNOSIS — Z79899 Other long term (current) drug therapy: Secondary | ICD-10-CM | POA: Diagnosis not present

## 2023-05-05 DIAGNOSIS — K5900 Constipation, unspecified: Secondary | ICD-10-CM | POA: Diagnosis present

## 2023-05-05 DIAGNOSIS — R079 Chest pain, unspecified: Principal | ICD-10-CM

## 2023-05-05 DIAGNOSIS — Z5111 Encounter for antineoplastic chemotherapy: Secondary | ICD-10-CM | POA: Insufficient documentation

## 2023-05-05 DIAGNOSIS — T451X5A Adverse effect of antineoplastic and immunosuppressive drugs, initial encounter: Secondary | ICD-10-CM | POA: Diagnosis present

## 2023-05-05 DIAGNOSIS — I252 Old myocardial infarction: Secondary | ICD-10-CM

## 2023-05-05 DIAGNOSIS — Z825 Family history of asthma and other chronic lower respiratory diseases: Secondary | ICD-10-CM

## 2023-05-05 DIAGNOSIS — Z7982 Long term (current) use of aspirin: Secondary | ICD-10-CM | POA: Diagnosis not present

## 2023-05-05 DIAGNOSIS — I214 Non-ST elevation (NSTEMI) myocardial infarction: Secondary | ICD-10-CM | POA: Diagnosis not present

## 2023-05-05 DIAGNOSIS — I2510 Atherosclerotic heart disease of native coronary artery without angina pectoris: Secondary | ICD-10-CM | POA: Diagnosis present

## 2023-05-05 DIAGNOSIS — G620 Drug-induced polyneuropathy: Secondary | ICD-10-CM | POA: Diagnosis present

## 2023-05-05 DIAGNOSIS — I10 Essential (primary) hypertension: Secondary | ICD-10-CM | POA: Diagnosis present

## 2023-05-05 DIAGNOSIS — Z888 Allergy status to other drugs, medicaments and biological substances status: Secondary | ICD-10-CM

## 2023-05-05 DIAGNOSIS — Z5112 Encounter for antineoplastic immunotherapy: Secondary | ICD-10-CM | POA: Insufficient documentation

## 2023-05-05 DIAGNOSIS — K219 Gastro-esophageal reflux disease without esophagitis: Secondary | ICD-10-CM | POA: Diagnosis present

## 2023-05-05 DIAGNOSIS — E785 Hyperlipidemia, unspecified: Secondary | ICD-10-CM | POA: Diagnosis present

## 2023-05-05 DIAGNOSIS — E119 Type 2 diabetes mellitus without complications: Secondary | ICD-10-CM | POA: Diagnosis present

## 2023-05-05 DIAGNOSIS — I5031 Acute diastolic (congestive) heart failure: Secondary | ICD-10-CM | POA: Diagnosis not present

## 2023-05-05 DIAGNOSIS — Z85048 Personal history of other malignant neoplasm of rectum, rectosigmoid junction, and anus: Secondary | ICD-10-CM | POA: Diagnosis not present

## 2023-05-05 DIAGNOSIS — Z87891 Personal history of nicotine dependence: Secondary | ICD-10-CM | POA: Diagnosis not present

## 2023-05-05 LAB — BASIC METABOLIC PANEL
Anion gap: 10 (ref 5–15)
BUN: 12 mg/dL (ref 8–23)
CO2: 23 mmol/L (ref 22–32)
Calcium: 9.5 mg/dL (ref 8.9–10.3)
Chloride: 103 mmol/L (ref 98–111)
Creatinine, Ser: 0.62 mg/dL (ref 0.44–1.00)
GFR, Estimated: >60 mL/min (ref >60)
Glucose, Bld: 155 mg/dL — ABNORMAL HIGH (ref 70–99)
Potassium: 3.9 mmol/L (ref 3.5–5.1)
Sodium: 136 mmol/L (ref 135–145)

## 2023-05-05 LAB — CMP (CANCER CENTER ONLY)
ALT: 13 U/L (ref 0–44)
AST: 14 U/L — ABNORMAL LOW (ref 15–41)
Albumin: 4.2 g/dL (ref 3.5–5.0)
Alkaline Phosphatase: 109 U/L (ref 38–126)
Anion gap: 6 (ref 5–15)
BUN: 13 mg/dL (ref 8–23)
CO2: 28 mmol/L (ref 22–32)
Calcium: 9.8 mg/dL (ref 8.9–10.3)
Chloride: 104 mmol/L (ref 98–111)
Creatinine: 0.63 mg/dL (ref 0.44–1.00)
GFR, Estimated: >60 mL/min (ref >60)
Glucose, Bld: 116 mg/dL — ABNORMAL HIGH (ref 70–99)
Potassium: 3.9 mmol/L (ref 3.5–5.1)
Sodium: 138 mmol/L (ref 135–145)
Total Bilirubin: 0.3 mg/dL (ref 0.0–1.2)
Total Protein: 7.5 g/dL (ref 6.5–8.1)

## 2023-05-05 LAB — CBC WITH DIFFERENTIAL (CANCER CENTER ONLY)
Abs Immature Granulocytes: 0.02 10*3/uL (ref 0.00–0.07)
Basophils Absolute: 0.1 10*3/uL (ref 0.0–0.1)
Basophils Relative: 1 %
Eosinophils Absolute: 0.3 10*3/uL (ref 0.0–0.5)
Eosinophils Relative: 4 %
HCT: 36.8 % (ref 36.0–46.0)
Hemoglobin: 11.8 g/dL — ABNORMAL LOW (ref 12.0–15.0)
Immature Granulocytes: 0 %
Lymphocytes Relative: 23 %
Lymphs Abs: 1.5 10*3/uL (ref 0.7–4.0)
MCH: 28.9 pg (ref 26.0–34.0)
MCHC: 32.1 g/dL (ref 30.0–36.0)
MCV: 90.2 fL (ref 80.0–100.0)
Monocytes Absolute: 0.8 10*3/uL (ref 0.1–1.0)
Monocytes Relative: 12 %
Neutro Abs: 4.1 10*3/uL (ref 1.7–7.7)
Neutrophils Relative %: 60 %
Platelet Count: 306 10*3/uL (ref 150–400)
RBC: 4.08 MIL/uL (ref 3.87–5.11)
RDW: 17.2 % — ABNORMAL HIGH (ref 11.5–15.5)
WBC Count: 6.8 10*3/uL (ref 4.0–10.5)
nRBC: 0 % (ref 0.0–0.2)

## 2023-05-05 LAB — CBC
HCT: 38.2 % (ref 36.0–46.0)
Hemoglobin: 11.9 g/dL — ABNORMAL LOW (ref 12.0–15.0)
MCH: 29 pg (ref 26.0–34.0)
MCHC: 31.2 g/dL (ref 30.0–36.0)
MCV: 92.9 fL (ref 80.0–100.0)
Platelets: 302 10*3/uL (ref 150–400)
RBC: 4.11 MIL/uL (ref 3.87–5.11)
RDW: 17.2 % — ABNORMAL HIGH (ref 11.5–15.5)
WBC: 8.4 10*3/uL (ref 4.0–10.5)
nRBC: 0 % (ref 0.0–0.2)

## 2023-05-05 LAB — CEA (ACCESS): CEA (CHCC): 42.03 ng/mL — ABNORMAL HIGH (ref 0.00–5.00)

## 2023-05-05 LAB — TROPONIN I (HIGH SENSITIVITY)
Troponin I (High Sensitivity): 74 ng/L — ABNORMAL HIGH (ref <18)
Troponin I (High Sensitivity): 221 ng/L (ref <18)
Troponin I (High Sensitivity): 324 ng/L (ref <18)

## 2023-05-05 LAB — D-DIMER, QUANTITATIVE: D-Dimer, Quant: 0.4 ug{FEU}/mL (ref 0.00–0.50)

## 2023-05-05 LAB — TOTAL PROTEIN, URINE DIPSTICK: Protein, ur: NEGATIVE mg/dL

## 2023-05-05 MED ORDER — NITROGLYCERIN 0.4 MG SL SUBL
SUBLINGUAL_TABLET | SUBLINGUAL | Status: AC
  Administered 2023-05-05: 0.4 mg
  Filled 2023-05-05: qty 1

## 2023-05-05 MED ORDER — ONDANSETRON HCL 4 MG/2ML IJ SOLN: 4.0000 mg | Freq: Four times a day (QID) | INTRAMUSCULAR | Status: DC | PRN

## 2023-05-05 MED ORDER — SODIUM CHLORIDE 0.9 % IV SOLN
5.0000 mg/kg | Freq: Once | INTRAVENOUS | Status: AC
  Administered 2023-05-05: 400 mg via INTRAVENOUS
  Filled 2023-05-05: qty 16

## 2023-05-05 MED ORDER — SODIUM CHLORIDE 0.9 % IV SOLN
Freq: Once | INTRAVENOUS | Status: AC
Start: 2023-05-05 — End: 2023-05-05

## 2023-05-05 MED ORDER — ACETAMINOPHEN 325 MG PO TABS: 650.0000 mg | ORAL_TABLET | Freq: Four times a day (QID) | ORAL | Status: DC | PRN

## 2023-05-05 MED ORDER — PANTOPRAZOLE SODIUM 20 MG PO TBEC
20.0000 mg | DELAYED_RELEASE_TABLET | Freq: Every day | ORAL | Status: DC
  Administered 2023-05-06 – 2023-05-08 (×3): 20 mg via ORAL
  Filled 2023-05-05 (×3): qty 1

## 2023-05-05 MED ORDER — SENNOSIDES-DOCUSATE SODIUM 8.6-50 MG PO TABS: 1.0000 | ORAL_TABLET | Freq: Every evening | ORAL | Status: DC | PRN

## 2023-05-05 MED ORDER — DEXAMETHASONE SODIUM PHOSPHATE 10 MG/ML IJ SOLN
10.0000 mg | Freq: Once | INTRAMUSCULAR | Status: AC
Start: 2023-05-05 — End: 2023-05-05
  Administered 2023-05-05: 10 mg via INTRAVENOUS
  Filled 2023-05-05: qty 1

## 2023-05-05 MED ORDER — ASPIRIN 81 MG PO CHEW
324.0000 mg | CHEWABLE_TABLET | Freq: Once | ORAL | Status: AC
  Administered 2023-05-05: 324 mg via ORAL
  Filled 2023-05-05: qty 4

## 2023-05-05 MED ORDER — FAMOTIDINE 20 MG PO TABS
20.0000 mg | ORAL_TABLET | Freq: Once | ORAL | Status: AC
  Administered 2023-05-05: 20 mg via ORAL
  Filled 2023-05-05: qty 1

## 2023-05-05 MED ORDER — FAMOTIDINE IN NACL 20-0.9 MG/50ML-% IV SOLN
20.0000 mg | Freq: Once | INTRAVENOUS | Status: AC
  Administered 2023-05-05: 20 mg via INTRAVENOUS

## 2023-05-05 MED ORDER — SODIUM CHLORIDE 0.9% FLUSH
10.0000 mL | Freq: Once | INTRAVENOUS | Status: AC
  Administered 2023-05-05: 10 mL

## 2023-05-05 MED ORDER — ASPIRIN 81 MG PO TBEC
81.0000 mg | DELAYED_RELEASE_TABLET | Freq: Every day | ORAL | Status: DC
  Administered 2023-05-06 – 2023-05-08 (×2): 81 mg via ORAL
  Filled 2023-05-05 (×3): qty 1

## 2023-05-05 MED ORDER — ONDANSETRON HCL 4 MG PO TABS: 4.0000 mg | ORAL_TABLET | Freq: Four times a day (QID) | ORAL | Status: DC | PRN

## 2023-05-05 MED ORDER — ACETAMINOPHEN 650 MG RE SUPP: 650.0000 mg | Freq: Four times a day (QID) | RECTAL | Status: DC | PRN

## 2023-05-05 MED ORDER — HEPARIN BOLUS VIA INFUSION
4000.0000 [IU] | Freq: Once | INTRAVENOUS | Status: AC
  Administered 2023-05-05: 4000 [IU] via INTRAVENOUS
  Filled 2023-05-05: qty 4000

## 2023-05-05 MED ORDER — PALONOSETRON HCL INJECTION 0.25 MG/5ML
0.2500 mg | Freq: Once | INTRAVENOUS | Status: AC
  Administered 2023-05-05: 0.25 mg via INTRAVENOUS
  Filled 2023-05-05: qty 5

## 2023-05-05 MED ORDER — SODIUM CHLORIDE 0.9 % IV SOLN: Freq: Once | INTRAVENOUS | Status: AC

## 2023-05-05 MED ORDER — SODIUM CHLORIDE 0.9 % IV SOLN
180.0000 mg/m2 | Freq: Once | INTRAVENOUS | Status: AC
  Administered 2023-05-05: 360 mg via INTRAVENOUS
  Filled 2023-05-05: qty 15

## 2023-05-05 MED ORDER — HEPARIN (PORCINE) 25000 UT/250ML-% IV SOLN
900.0000 [IU]/h | INTRAVENOUS | Status: DC
  Administered 2023-05-05 – 2023-05-06 (×2): 900 [IU]/h via INTRAVENOUS
  Filled 2023-05-05 (×2): qty 250

## 2023-05-05 MED ORDER — SODIUM CHLORIDE 0.9 % IV SOLN
400.0000 mg/m2 | Freq: Once | INTRAVENOUS | Status: AC
  Administered 2023-05-05: 780 mg via INTRAVENOUS
  Filled 2023-05-05: qty 25

## 2023-05-05 MED ORDER — SODIUM CHLORIDE 0.9% FLUSH
3.0000 mL | Freq: Two times a day (BID) | INTRAVENOUS | Status: DC
  Administered 2023-05-06 – 2023-05-07 (×3): 3 mL via INTRAVENOUS

## 2023-05-05 MED ORDER — SODIUM CHLORIDE 0.9 % IV SOLN
2400.0000 mg/m2 | INTRAVENOUS | Status: DC
  Filled 2023-05-05: qty 100

## 2023-05-05 MED ORDER — SODIUM CHLORIDE 0.9% FLUSH: 10.0000 mL | INTRAVENOUS | Status: DC | PRN

## 2023-05-05 MED ORDER — ATROPINE SULFATE 1 MG/ML IV SOLN
0.5000 mg | Freq: Once | INTRAVENOUS | Status: AC | PRN
  Administered 2023-05-05: 0.5 mg via INTRAVENOUS
  Filled 2023-05-05: qty 1

## 2023-05-05 MED ORDER — VERAPAMIL HCL ER 120 MG PO TBCR
120.0000 mg | EXTENDED_RELEASE_TABLET | Freq: Every day | ORAL | Status: DC
  Filled 2023-05-05: qty 1

## 2023-05-05 MED ORDER — HEPARIN SOD (PORK) LOCK FLUSH 100 UNIT/ML IV SOLN: 500.0000 [IU] | Freq: Once | INTRAVENOUS | Status: DC | PRN

## 2023-05-05 MED ORDER — NITROGLYCERIN 0.4 MG SL SUBL
0.4000 mg | SUBLINGUAL_TABLET | SUBLINGUAL | Status: DC | PRN
  Administered 2023-05-06: 0.4 mg via SUBLINGUAL
  Filled 2023-05-05: qty 1

## 2023-05-05 NOTE — Progress Notes (Signed)
 At 1120 Pt had c/o gas and abdominal discomfort. This RN administered IV atropine 0.5 mg as prescribed. At 1130 Pt had c/o worsened gas and new c/o indigestion. This RN notified Heather NP. Per Herbert Seta NP OK to proceed with tx and this RN received v/o for Pepcid 20 mg PO to given. At 1137 this RN administered Pepcid 20 mg PO. At 1142 Pt vomited three times in trash can/emesis bag. Pt stated "I just need to burp" and had worsened c/o chest pain, upon visual assessment Pt was very restless and refused to sit down. At this time infusion stopped, IVFs started and Pepcid 20 mg IV administered. Heather NP notified. At 1145 [facility] NP and Dr. Mosetta Putt chairside. EKG obtained. At 1153 v/o for 1 dose of nitroglycerin 0.4 SL tablet obtained. At 1155 nitroglycerin administered. At 1206 Pt's chest pain 2/10, and Pt stated "My chest is easing up, I feel fine now". Dr. Mosetta Putt recommended Pt be transferred to ED for further evaluation. Pt made aware and agreeable with plan. Pt made family aware. At 1259 this RN and Karene Fry RN transported Pt via w/c to ED. Pt's PAC remained accessed with antimicrobial disc in place at time of transfer, ED RN made aware of Pt's PAC. This RN gave handoff report to Rush Farmer RN in ED.  Pt remained A&O x 4 throughout event. See flowsheets and MAR for VS checks and mediation administrations.

## 2023-05-05 NOTE — Progress Notes (Signed)
 PHARMACY - ANTICOAGULATION CONSULT NOTE  Pharmacy Consult for heparin  Indication: chest pain/ACS  Allergies  Allergen Reactions   Oxaliplatin Nausea Only    Diaphoresis, hypotension, and bradycardia-patient was on 18th dose and had a reaction that sent her to the emergency room    Patient Measurements: Height: 5\' 6"  (167.6 cm) Weight: 82 kg (180 lb 12.4 oz) IBW/kg (Calculated) : 59.3 Heparin Dosing Weight: 76 kg  Vital Signs: Temp: 98 F (36.7 C) (02/26 1654) Temp Source: Oral (02/26 1654) BP: 165/72 (02/26 1654) Pulse Rate: 77 (02/26 1654)  Labs: Recent Labs    05/05/23 0844 05/05/23 1407 05/05/23 1733  HGB 11.8* 11.9*  --   HCT 36.8 38.2  --   PLT 306 302  --   CREATININE 0.63 0.62  --   TROPONINIHS  --  74* 221*    Estimated Creatinine Clearance: 71.7 mL/min (by C-G formula based on SCr of 0.62 mg/dL).   Medical History: Past Medical History:  Diagnosis Date   Arthritis    Diabetes mellitus without complication (HCC)    Hypertension    met colon ca to liver 01/2020    Assessment: Patient is a 68 y.o F with metastatic colon cancer who c/o CP while she was at [facility] on 05/05/23 for her chemotherapy treatment and subsequently transferred to the [facility] ED for further workup. Troponin was found to be elevated in the ED.  Pharmacy has been consulted to dose heparin drip for ACS.  Today, 05/05/2023: - cbc ok - scr <1 - troponin 74>>221   Goal of Therapy:  Heparin level 0.3-0.7 units/ml Monitor platelets by anticoagulation protocol: Yes   Plan:  - heparin 4000 units IV x1 bolus, then 900 units/hr - check 6 hr heparin level - monitor for s/sx bleeding   Hassan Blackshire P 05/05/2023,7:39 PM

## 2023-05-05 NOTE — H&P (Signed)
 History and Physical    Lindsay [name] ZOX:[id number] DOB: 07/28/1955 DOA: 05/05/2023  PCP: Ralene Ok, MD  Patient coming from: Home  I have personally briefly reviewed patient's old medical records in [facility]  Chief Complaint: Chest pain  HPI: Lindsay [name] is a 68 y.o. 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  Patient was in the cancer clinic earlier today to receive her continued chemotherapy treatment.  Shortly after her infusion started she developed significant retrosternal chest discomfort described as a sensation of gas and severe heartburn.  Patient was given IV atropine 0.5 mg and oral Pepcid 20 mg.  Afterwards patient vomited and complained of worsening chest discomfort.  She noticed that her blood pressure became significantly elevated.  She was given 1 dose of sublingual nitroglycerin with significant relief in her symptoms.  Patient states that she has had intermittent episodes of similar heartburn symptoms over the last few days.  She states that she does become short of breath with moderate exertion which has been ongoing over the last year.  Dyspnea improves with rest.  ED Course  Labs/Imaging on admission: I have personally reviewed following labs and imaging studies.  Initial vitals showed BP 144/90, pulse 82, RR 20, temp 97.9 F, SpO2 100% on room air.  Labs reveal WBC 8.4, hemoglobin 11.9, platelets 302,000, sodium 136, potassium 3.9, bicarb 23, BUN 12, creatinine 0.62, serum glucose 155, D-dimer 0.40.  Troponin 74 > 221.  Portable chest x-ray negative for focal consolidation, edema, effusion.  EDP spoke with on-call cardiology, Dr. Antoine Poche, who [facility]  Patient was given aspirin 324 mg and started on IV heparin.  The hospitalist service was consulted to admit.  Review of Systems:  All systems reviewed and are negative except as documented in history of present illness above.   Past Medical History:  Diagnosis Date   Arthritis    Diabetes mellitus without complication (HCC)    Hypertension    met colon ca to liver 01/2020    Past Surgical History:  Procedure Laterality Date   ABDOMINAL HYSTERECTOMY     ANTERIOR AND POSTERIOR REPAIR WITH SACROSPINOUS FIXATION N/A 07/16/2016   Procedure: ANTERIOR AND POSTERIOR REPAIR WITH SACROSPINOUS FIXATION;  Surgeon: Harold Hedge, MD;  Location: [facility];  Service: Gynecology;  Laterality: N/A;   CYSTOSCOPY  07/16/2016   Procedure: CYSTOSCOPY;  Surgeon: Harold Hedge, MD;  Location: [facility];  Service: Gynecology;;   ESOPHAGOGASTRODUODENOSCOPY (EGD) WITH PROPOFOL N/A 12/22/2019   Procedure: ESOPHAGOGASTRODUODENOSCOPY (EGD) WITH PROPOFOL;  Surgeon: Jeani Hawking, MD;  Location: [facility];  Service: Endoscopy;  Laterality: N/A;   FINE NEEDLE ASPIRATION N/A 12/22/2019   Procedure: FINE NEEDLE ASPIRATION (FNA) LINEAR;  Surgeon: Jeani Hawking, MD;  Location: [facility];  Service: Endoscopy;  Laterality: N/A;   IR IMAGING GUIDED PORT INSERTION  01/24/2020   LAPAROSCOPIC VAGINAL HYSTERECTOMY WITH SALPINGECTOMY Bilateral 07/16/2016   Procedure: LAPAROSCOPIC ASSISTED VAGINAL HYSTERECTOMY WITH SALPINGECTOMY;  Surgeon: Harold Hedge, MD;  Location: [facility];  Service: Gynecology;  Laterality: Bilateral;   MYOMECTOMY ABDOMINAL APPROACH     THYROID SURGERY     tyroid     UPPER ESOPHAGEAL ENDOSCOPIC ULTRASOUND (EUS) N/A 12/22/2019   Procedure: UPPER ESOPHAGEAL ENDOSCOPIC ULTRASOUND (EUS);  Surgeon: Jeani Hawking, MD;  Location: [facility];  Service: Endoscopy;  Laterality: N/A;    Social History:  reports that  she quit smoking about 4 years ago. Her smoking use included cigarettes. She started smoking about 22 years ago. She has a 4.5 pack-year smoking history. She has never used smokeless tobacco. She reports current alcohol use of  about 3.0 standard drinks of alcohol per week. She reports that she does not use drugs.  Allergies  Allergen Reactions   Oxaliplatin Nausea Only    Diaphoresis, hypotension, and bradycardia-patient was on 18th dose and had a reaction that sent her to the emergency room    Family History  Problem Relation Age of Onset   Aneurysm Mother    Asthma Father      Prior to Admission medications   Medication Sig Start Date End Date Taking? Authorizing Provider  KLOR-CON M20 20 MEQ tablet TAKE 1 TABLET TWICE A DAY 08/04/22  Yes Malachy Mood, MD  pantoprazole (PROTONIX) 20 MG tablet Take 1 tablet (20 mg total) by mouth daily. 03/11/23  Yes Pollyann Samples, NP  verapamil (CALAN-SR) 120 MG CR tablet Take 120 mg by mouth daily. 12/17/22  Yes [provider]    Physical Exam: Vitals:   05/05/23 1324 05/05/23 1630 05/05/23 1654 05/05/23 1930  BP:  (!) 132/90 (!) 165/72 (!) 153/88  Pulse:   77 84  Resp:  15 18 20   Temp:   98 F (36.7 C)   TempSrc:   Oral   SpO2:   96% 96%  Weight: 82 kg     Height: 5\' 6"  (1.676 m)      Constitutional: Resting in bed, NAD, calm, comfortable Eyes: EOMI, lids and conjunctivae normal ENMT: Mucous membranes are moist. Posterior pharynx clear of any exudate or lesions.Normal dentition.  Neck: normal, supple, no masses. Respiratory: clear to auscultation bilaterally, no wheezing, no crackles. Normal respiratory effort. No accessory muscle use.  Cardiovascular: Regular rate and rhythm, systolic murmur. No extremity edema. 2+ pedal pulses.  Port-A-Cath in place. Abdomen: no tenderness, no masses palpated.  Musculoskeletal: no clubbing / cyanosis. No joint deformity upper and lower extremities. Good ROM, no contractures. Normal muscle tone.  Skin: no rashes, lesions, ulcers. No induration Neurologic:  Sensation intact. Strength 5/5 in all 4.  Psychiatric: Normal judgment and insight. Alert and oriented x 3. Normal mood.   EKG: Personally reviewed. Sinus  rhythm, rate 84, no acute ischemic changes.  Assessment/Plan Principal Problem:   NSTEMI (non-ST elevation myocardial infarction) [facility]) Active Problems:   metastatic colon cancer to liver   Hypertension   Lindsay [name] is a 68 y.o. 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  Assessment and Plan: Chest pain/elevated troponin: Patient presenting with central chest discomfort, described as severe heartburn and gas stuck in her chest sensation.  Symptoms began shortly after start of chemotherapy infusion and relieved with SL NTG.  EKG without acute ischemic changes.  Troponin 74 >> 221. -Cardiology to consult -Started on IV heparin, continue -Continue aspirin 81 mg daily -Obtain echocardiogram -Cycle troponin -SL NTG prn  Stage IV colon cancer metastatic to liver: Follows with oncology Dr. Mosetta Putt.  On active treatment with FOLFIRI/bevacizumab.  Hypertension: Continue verapamil 120 mg daily.   DVT prophylaxis: IV heparin Code Status: Full code, confirmed with patient on admission Family Communication: Discussed with patient, she has discussed with family Disposition Plan: From home and likely discharge to home pending clinical progress Consults called: Cardiology Severity of Illness: The appropriate patient  status for this patient is INPATIENT. Inpatient status is judged to be reasonable and necessary in order to provide the required intensity of service to ensure the patient's safety. The patient's presenting symptoms, physical exam findings, and initial radiographic and laboratory data in the context of their chronic comorbidities is felt to place them at high risk for further clinical deterioration. Furthermore, it is not anticipated that the patient will be medically stable for discharge from the hospital within 2 midnights of admission.   * I  certify that at the point of admission it is my clinical judgment that the patient will require inpatient hospital care spanning beyond 2 midnights from the point of admission due to high intensity of service, high risk for further deterioration and high frequency of surveillance required.Darreld Mclean MD [facility]  If 7PM-7AM, please contact night-coverage [email or web address]  05/05/2023, 8:59 PM

## 2023-05-05 NOTE — ED Triage Notes (Signed)
 Patient sent from cancer center. Was getting chemo for stage 4 colon cancer with liver mets. While she was getting chemo infusion she began to have right sided chest pain. Pain moves to center of her chest. Felt nauseous and vomited x1 and sweaty.   Cancer center gave 20mg  oral pepcid, 0.5mg  atropine IV, 0.4mg  nitroglycerin, IV pepcid.   Port accessed on arrival.

## 2023-05-05 NOTE — Hospital Course (Addendum)
 Brief Narrative:  68 year old with history of stage IV colon cancer with metastatic disease to liver on chemotherapy, GERD, HTN presented from the cancer center for chest pain.  Patient also reporting of heartburn.  Lab work showed rising troponin therefore cardiology team consulted.  Echocardiogram showed preserved EF of 60%.  Left heart catheterization on 2/28 showed severe to small vessel disease not amenable to PCI.  Recommending DAPT, aspirin and Plavix along with GDMT. Cleared for discharge today   Assessment & Plan:  Principal Problem:   NSTEMI (non-ST elevation myocardial infarction) (HCC) Active Problems:   metastatic colon cancer to liver   Hypertension   Acute coronary syndrome/NSTEMI CAD -Chest pain appears to have subsided.  Initially on heparin drip and underwent cardiac catheterization on 2/28 showing severe small vessel disease not amenable to PCI.  Recommending DAPT with aspirin and Plavix along with GDMT.  Management per cardiology team.  Echocardiogram shows preserved EF with grade 2 DD.  LDL 69.  Metastatic colon cancer -Mets to the liver.  Currently on chemotherapy, follows Dr. Mosetta Putt  Essential hypertension - On metoprolol  GERD -PPI      Code Status: Full Code Family Communication:   Status is: Inpatient Remains inpatient appropriate because: Dc once cleared by cardiology.     Subjective: Feeling well wants to go home   Examination:  General exam: Appears calm and comfortable  Respiratory system: Clear to auscultation. Respiratory effort normal. Cardiovascular system: S1 & S2 heard, RRR. No JVD, murmurs, rubs, gallops or clicks. No pedal edema. Gastrointestinal system: Abdomen is nondistended, soft and nontender. No organomegaly or masses felt. Normal bowel sounds heard. Central nervous system: Alert and oriented. No focal neurological deficits. Extremities: Symmetric 5 x 5 power. Skin: No rashes, lesions or ulcers Psychiatry: Judgement and insight  appear normal. Mood & affect appropriate.

## 2023-05-05 NOTE — ED Provider Notes (Signed)
 [place] EMERGENCY DEPARTMENT AT [facility] Provider Note   CSN: [id number] Arrival date & time: 05/05/23  1308     History  Chief Complaint  Patient presents with   Chest Pain    Lindsay [name] is a 68 y.o. female.  68 year old female with past medical history of hypertension and hyperlipidemia presenting to the emergency department today with chest pain.  Patient states she started with some right-sided chest pain while she was waiting to get a chemotherapy treatment for her colon cancer.  Patient states the pain is on the right side.  Do not radiate.  She reports this was a pressure/sharp pain.  She reports this lasted a few minutes.  She states that she did have some associated nausea.  Denies any vomiting.  She states that she does have pain similar to this from time to time at home and will usually subside with time.  It was not today so she was brought to the ER for further evaluation.  She denies any recent fevers, chills, or cough.   Chest Pain      Home Medications Prior to Admission medications   Medication Sig Start Date End Date Taking? Authorizing Provider  atorvastatin (LIPITOR) 20 MG tablet Take 20 mg by mouth at bedtime.  07/03/16   [provider]  hydrochlorothiazide (MICROZIDE) 12.5 MG capsule Take 12.5 mg by mouth daily.    [provider]  KLOR-CON M20 20 MEQ tablet TAKE 1 TABLET TWICE A DAY 08/04/22   Malachy Mood, MD  lidocaine-prilocaine (EMLA) cream Apply 1 Application topically as needed. 07/28/22   Heilingoetter, Cassandra L, PA-C  magic mouthwash w/lidocaine SOLN Take 5 mLs by mouth 4 (four) times daily. 07/28/22   Heilingoetter, Cassandra L, PA-C  ondansetron (ZOFRAN) 8 MG tablet Take 1 tablet (8 mg total) by mouth every 8 (eight) hours as needed. 07/28/22   Heilingoetter, Cassandra L, PA-C  pantoprazole (PROTONIX) 20 MG tablet Take 1 tablet (20 mg total) by mouth daily. 03/11/23   Pollyann Samples, NP  prochlorperazine (COMPAZINE)  10 MG tablet Take 1 tablet (10 mg total) by mouth every 6 (six) hours as needed for nausea or vomiting. 01/16/22   Malachy Mood, MD  spironolactone (ALDACTONE) 25 MG tablet Take 25 mg by mouth daily with breakfast. 09/19/19   [provider]      Allergies    Oxaliplatin    Review of Systems   Review of Systems  Cardiovascular:  Positive for chest pain.  All other systems reviewed and are negative.   Physical Exam Updated Vital Signs BP (!) 144/90   Pulse 89   Temp 97.9 F (36.6 C) (Oral)   Resp 20   Ht 5\' 6"  (1.676 m)   Wt 82 kg   SpO2 100%   BMI 29.18 kg/m  Physical Exam Vitals and nursing note reviewed.   Gen: NAD Eyes: PERRL, EOMI HEENT: no oropharyngeal swelling Neck: trachea midline Resp: clear to auscultation bilaterally Card: RRR, no murmurs, rubs, or gallops Abd: nontender, nondistended Extremities: no calf tenderness, no edema Vascular: 2+ radial pulses bilaterally, 2+ DP pulses bilaterally Neuro: No focal deficits Skin: no rashes   ED Results / Procedures / Treatments   Labs (all labs ordered are listed, but only abnormal results are displayed) Labs Reviewed  BASIC METABOLIC PANEL - Abnormal; Notable for the following components:      Result Value   Glucose, Bld 155 (*)    All other components within normal  limits  CBC - Abnormal; Notable for the following components:   Hemoglobin 11.9 (*)    RDW 17.2 (*)    All other components within normal limits  TROPONIN I (HIGH SENSITIVITY) - Abnormal; Notable for the following components:   Troponin I (High Sensitivity) 74 (*)    All other components within normal limits  D-DIMER, QUANTITATIVE  TROPONIN I (HIGH SENSITIVITY)    EKG EKG Interpretation Date/Time:  Wednesday May 05 2023 13:21:31 EST Ventricular Rate:  84 PR Interval:  174 QRS Duration:  79 QT Interval:  385 QTC Calculation: 456 R Axis:   20  Text Interpretation: Sinus rhythm Abnormal R-wave progression, early transition  Probable LVH with secondary repol abnrm Confirmed by Beckey Downing [phone]) on 05/05/2023 3:06:47 PM  Radiology DG Chest Port 1 View Result Date: 05/05/2023 CLINICAL DATA:  Metastatic colon cancer, chest pain during chemotherapy EXAM: PORTABLE CHEST 1 VIEW COMPARISON:  03/24/2023, 07/21/2022 FINDINGS: Single frontal view of the chest demonstrates stable right chest wall port. The cardiac silhouette is unremarkable. No acute airspace disease, effusion, or pneumothorax. No acute displaced fractures. IMPRESSION: 1. No acute intrathoracic process. Electronically Signed   By: Sharlet Salina M.D.   On: 05/05/2023 15:28    Procedures Procedures    Medications Ordered in ED Medications - No data to display  ED Course/ Medical Decision Making/ A&P                                 Medical Decision Making 68 year old female with past medical history of hypertension hyperlipidemia presents the emergency department today with chest pain.  I will further evaluate patient here with basic labs as well as an EKG, chest x-ray, and troponin for further evaluation for ACS, pulmonary edema, pulmonary infiltrates, pneumothorax.  Also obtain a D-dimer given her cancer history to evaluate for pulmonary embolism.  Based on description of her symptoms suspicion for aortic dissection is low at this time.  The patient's EKG interpreted by me shows sinus rhythm with a rate of 84 with normal axis, normal intervals, no significant ST-T changes.  I spoke with Dr. Herbie Baltimore from cardiology as the patient's initial troponin came back mildly elevated at 74.  He felt that if the patient remained chest pain-free here and her second troponin was less than 100 then she could likely follow-up with them as an outpatient.  He did recommend that if she develop further chest pain or if her troponin did go up more that she would require admission.  Second troponin pending at the time of signout.  Amount and/or Complexity of Data  Reviewed Labs: ordered. Radiology: ordered.           Final Clinical Impression(s) / ED Diagnoses Final diagnoses:  Nonspecific chest pain    Rx / DC Orders ED Discharge Orders          Ordered    Ambulatory referral to Cardiology       Comments: If you have not heard from the Cardiology office within the next 72 hours please call [phone].   05/05/23 1621              Durwin Glaze, MD 05/05/23 [phone]

## 2023-05-05 NOTE — ED Provider Notes (Addendum)
 Patient signed out to me at 1700 by Dr. Karie Schwalbe pending repeat troponin.  In short this is a 68 year old female with past medical history of hypertension and hyperlipidemia, colon cancer and chemotherapy that presented to the emergency department at chest pain.  She reports that she was at chemo when she had sudden onset of chest pressure with associated nausea.  She states that she had similar pain that was coming and going the last few days but was more severe today.  EKG on arrival showed normal sinus rhythm without acute ischemic changes and initial troponin was minimally elevated at 74.  Patient's repeat troponin increased to 221.  The patient is currently chest pain-free.  Patient will be given aspirin and started on heparin and will require admission.  7:37 PM - I spoke with Dr. Antoine Poche with cardiology who states she is ok to be admitted at [facility] and they will seen in consultation.   CRITICAL CARE Performed by: Rexford Maus   Total critical care time: 35 minutes  Critical care time was exclusive of separately billable procedures and treating other patients.  Critical care was necessary to treat or prevent imminent or life-threatening deterioration.  Critical care was time spent personally by me on the following activities: development of treatment plan with patient and/or surrogate as well as nursing, discussions with consultants, evaluation of patient's response to treatment, examination of patient, obtaining history from patient or surrogate, ordering and performing treatments and interventions, ordering and review of laboratory studies, ordering and review of radiographic studies, pulse oximetry and re-evaluation of patient's condition.      Rexford Maus, DO 05/05/23 [phone]

## 2023-05-05 NOTE — Discharge Instructions (Signed)
 Your workup today was reassuring.  Please follow-up with the cardiologist clinic.  I have placed a referral.  You should receive a call in the next day or 2 for instructions on follow-up.  Return to the ER for worsening symptoms.

## 2023-05-06 ENCOUNTER — Inpatient Hospital Stay (HOSPITAL_COMMUNITY): Payer: Medicare (Managed Care)

## 2023-05-06 DIAGNOSIS — I214 Non-ST elevation (NSTEMI) myocardial infarction: Secondary | ICD-10-CM | POA: Diagnosis not present

## 2023-05-06 DIAGNOSIS — I5031 Acute diastolic (congestive) heart failure: Secondary | ICD-10-CM

## 2023-05-06 LAB — HEPARIN LEVEL (UNFRACTIONATED)
Heparin Unfractionated: 0.54 [IU]/mL (ref 0.30–0.70)
Heparin Unfractionated: 0.61 [IU]/mL (ref 0.30–0.70)

## 2023-05-06 LAB — ECHOCARDIOGRAM COMPLETE
AR max vel: 0.85 cm2
AV Area VTI: 0.85 cm2
AV Area mean vel: 0.78 cm2
AV Mean grad: 20 mm[Hg]
AV Peak grad: 37.7 mm[Hg]
Ao pk vel: 3.07 m/s
Area-P 1/2: 3.08 cm2
Height: 66 in
S' Lateral: 2.9 cm
Weight: 2892.44 [oz_av]

## 2023-05-06 LAB — BASIC METABOLIC PANEL
Anion gap: 9 (ref 5–15)
BUN: 17 mg/dL (ref 8–23)
CO2: 22 mmol/L (ref 22–32)
Calcium: 9.2 mg/dL (ref 8.9–10.3)
Chloride: 102 mmol/L (ref 98–111)
Creatinine, Ser: 0.64 mg/dL (ref 0.44–1.00)
GFR, Estimated: >60 mL/min (ref >60)
Glucose, Bld: 149 mg/dL — ABNORMAL HIGH (ref 70–99)
Potassium: 4 mmol/L (ref 3.5–5.1)
Sodium: 133 mmol/L — ABNORMAL LOW (ref 135–145)

## 2023-05-06 LAB — CBC
HCT: 35.8 % — ABNORMAL LOW (ref 36.0–46.0)
Hemoglobin: 11.4 g/dL — ABNORMAL LOW (ref 12.0–15.0)
MCH: 29.1 pg (ref 26.0–34.0)
MCHC: 31.8 g/dL (ref 30.0–36.0)
MCV: 91.3 fL (ref 80.0–100.0)
Platelets: 294 10*3/uL (ref 150–400)
RBC: 3.92 MIL/uL (ref 3.87–5.11)
RDW: 17.3 % — ABNORMAL HIGH (ref 11.5–15.5)
WBC: 9.6 10*3/uL (ref 4.0–10.5)
nRBC: 0 % (ref 0.0–0.2)

## 2023-05-06 LAB — HIV ANTIBODY (ROUTINE TESTING W REFLEX): HIV Screen 4th Generation wRfx: NONREACTIVE

## 2023-05-06 LAB — TROPONIN I (HIGH SENSITIVITY): Troponin I (High Sensitivity): 314 ng/L (ref <18)

## 2023-05-06 MED ORDER — ASPIRIN 81 MG PO CHEW
81.0000 mg | CHEWABLE_TABLET | ORAL | Status: AC
  Administered 2023-05-07: 81 mg via ORAL
  Filled 2023-05-06: qty 1

## 2023-05-06 MED ORDER — ATORVASTATIN CALCIUM 40 MG PO TABS
80.0000 mg | ORAL_TABLET | Freq: Every day | ORAL | Status: DC
  Administered 2023-05-06 – 2023-05-08 (×3): 80 mg via ORAL
  Filled 2023-05-06 (×3): qty 2

## 2023-05-06 MED ORDER — METOPROLOL TARTRATE 25 MG PO TABS
12.5000 mg | ORAL_TABLET | Freq: Two times a day (BID) | ORAL | Status: DC
  Administered 2023-05-06 – 2023-05-08 (×4): 12.5 mg via ORAL
  Filled 2023-05-06 (×5): qty 1

## 2023-05-06 MED ORDER — POLYETHYLENE GLYCOL 3350 17 G PO PACK
17.0000 g | PACK | Freq: Every day | ORAL | Status: DC
  Administered 2023-05-06: 17 g via ORAL
  Filled 2023-05-06 (×3): qty 1

## 2023-05-06 MED ORDER — SENNOSIDES-DOCUSATE SODIUM 8.6-50 MG PO TABS
1.0000 | ORAL_TABLET | Freq: Two times a day (BID) | ORAL | Status: DC
  Administered 2023-05-07 – 2023-05-08 (×2): 1 via ORAL
  Filled 2023-05-06 (×4): qty 1

## 2023-05-06 NOTE — Progress Notes (Signed)
 PROGRESS NOTE  Lindsay [name]  ZOX:[id number] DOB: [birth year] DOA: 05/05/2023 PCP: Ralene Ok, MD   Brief Narrative: Patient is a 68 year old female with history of stage IV colon cancer metastatic to liver, currently on chemotherapy, hypertension who presented from cancer center with complaint of chest pain .  At baseline, she has GERD with heartburn symptoms.  On presentation, she was hemodynamically stable.  Lab work showed elevated troponin with positive delta.  Echo ordered.  Cardiology consulted.  Started on aspirin, heparin.  Possible plan for cardiac cath  Assessment & Plan:  Principal Problem:   NSTEMI (non-ST elevation myocardial infarction) [facility]) Active Problems:   metastatic colon cancer to liver   Hypertension  Chest pain/elevated troponin: Presented with sudden chest discomfort before getting chemotherapy.  Patient has history of GERD/severe heartburn.  EKG did not show any ischemic changes.  Troponins elevated with positive delta.  Cardio consulted, echo pending.  Currently on IV heparin.  Started aspirin 81 mg daily. Patient completely chest pain-free during my evaluation today.  Cardiology planning for cardiac cath.  Troponins now plated in the range of 300s. CT imaging done as an outpatient on 1/15 had showed coronary calcification  Stage IV colon cancer: Mets to liver.  Currently on chemotherapy.  Follows with Dr. Mosetta Putt.  Has a chemo port  Hypertension: On Verapamil         DVT prophylaxis:iv heparin     Code Status: Full Code  Family Communication: None at bedside  Patient status:Inpatient  Patient is from :home  Anticipated discharge [id number]  Estimated DC date: After full cardiac workup   Consultants: Cardiology  Procedures: None  Antimicrobials:  Anti-infectives (From admission, onward)    None       Subjective: Patient seen and examined at bedside today.  Hemodynamically stable.  She was comfortable lying on the bed.  Eating  [employer].  Denies any chest pain or shortness of breath.  Very comfortable  Objective: Vitals:   05/06/23 0130 05/06/23 0400 05/06/23 0432 05/06/23 0700  BP: 114/73 137/85  139/75  Pulse: 70 78  79  Resp: 17 16  19   Temp: 98.5 F (36.9 C)  (!) 97.3 F (36.3 C)   TempSrc:   Oral   SpO2: 100% 100%  100%  Weight:      Height:        Intake/Output Summary (Last 24 hours) at 05/06/2023 0825 Last data filed at 05/05/2023 2054 Gross per 24 hour  Intake 480 ml  Output --  Net 480 ml   Filed Weights   05/05/23 1324  Weight: 82 kg    Examination:  General exam: Overall comfortable, not in distress HEENT: PERRL Respiratory system:  no wheezes or crackles  Cardiovascular system: S1 & S2 heard, RRR.  Chemo-Port on the right chest Gastrointestinal system: Abdomen is nondistended, soft and nontender. Central nervous system: Alert and oriented Extremities: No edema, no clubbing ,no cyanosis Skin: No rashes, no ulcers,no icterus     Data Reviewed: I have personally reviewed following labs and imaging studies  CBC: Recent Labs  Lab 05/05/23 0844 05/05/23 1407 05/06/23 0343  WBC 6.8 8.4 9.6  NEUTROABS 4.1  --   --   HGB 11.8* 11.9* 11.4*  HCT 36.8 38.2 35.8*  MCV 90.2 92.9 91.3  PLT 306 302 294   Basic Metabolic Panel: Recent Labs  Lab 05/05/23 0844 05/05/23 1407 05/06/23 0343  NA 138 136 133*  K 3.9 3.9 4.0  CL 104 103 102  CO2  28 23 22   GLUCOSE 116* 155* 149*  BUN 13 12 17   CREATININE 0.63 0.62 0.64  CALCIUM 9.8 9.5 9.2     No results found for this or any previous visit (from the past 240 hours).   Radiology Studies: DG Chest Port 1 View Result Date: 05/05/2023 CLINICAL DATA:  Metastatic colon cancer, chest pain during chemotherapy EXAM: PORTABLE CHEST 1 VIEW COMPARISON:  03/24/2023, 07/21/2022 FINDINGS: Single frontal view of the chest demonstrates stable right chest wall port. The cardiac silhouette is unremarkable. No acute airspace disease, effusion,  or pneumothorax. No acute displaced fractures. IMPRESSION: 1. No acute intrathoracic process. Electronically Signed   By: Sharlet Salina M.D.   On: 05/05/2023 15:28    Scheduled Meds:  aspirin EC  81 mg Oral Daily   pantoprazole  20 mg Oral Daily   sodium chloride flush  3 mL Intravenous Q12H   verapamil  120 mg Oral Daily   Continuous Infusions:  heparin 900 Units/hr (05/06/23 0430)     LOS: 1 day   Burnadette Pop, MD [facility] P2/27/2025, 8:25 AM

## 2023-05-06 NOTE — Progress Notes (Signed)
 PHARMACY - ANTICOAGULATION CONSULT NOTE  Pharmacy Consult for heparin  Indication: chest pain/ACS  Allergies  Allergen Reactions   Oxaliplatin Nausea Only    Diaphoresis, hypotension, and bradycardia-patient was on 18th dose and had a reaction that sent her to the emergency room    Patient Measurements: Height: 5\' 6"  (167.6 cm) Weight: 82 kg (180 lb 12.4 oz) IBW/kg (Calculated) : 59.3 Heparin Dosing Weight: 76 kg  Vital Signs: Temp: 98.5 F (36.9 C) (02/27 0130) Temp Source: Oral (02/26 1654) BP: 137/85 (02/27 0400) Pulse Rate: 78 (02/27 0400)  Labs: Recent Labs    05/05/23 0844 05/05/23 1407 05/05/23 1733 05/05/23 2133 05/06/23 0343  HGB 11.8* 11.9*  --   --  11.4*  HCT 36.8 38.2  --   --  35.8*  PLT 306 302  --   --  294  HEPARINUNFRC  --   --   --   --  0.54  CREATININE 0.63 0.62  --   --  0.64  TROPONINIHS  --  74* 221* 324*  --     Estimated Creatinine Clearance: 71.7 mL/min (by C-G formula based on SCr of 0.64 mg/dL).   Medical History: Past Medical History:  Diagnosis Date   Arthritis    Diabetes mellitus without complication (HCC)    Hypertension    met colon ca to liver 01/2020    Assessment: Patient is a 68 y.o F with metastatic colon cancer who c/o CP while she was at [facility] on 05/05/23 for her chemotherapy treatment and subsequently transferred to the [facility] ED for further workup. Troponin was found to be elevated in the ED.  Pharmacy has been consulted to dose heparin drip for ACS.  Today, 05/06/2023: - heparin level 0.54 (therapeutic) with heparin gtt @ 900 units/hr - Hgb 1.4, pltc wnl - no bleeding reported by RN   Goal of Therapy:  Heparin level 0.3-0.7 units/ml Monitor platelets by anticoagulation protocol: Yes   Plan:  -Continue heparin gtt @ 900 units/hr - check 6 hr heparin level to confirm therapeutic dose - daily CBC and heparin level - monitor for s/sx bleeding   Tiffine Henigan, Joselyn Glassman, PharmD 05/06/2023,4:29 AM

## 2023-05-06 NOTE — ED Notes (Signed)
 Pt report relief of angina after nitroglycerin was given.

## 2023-05-06 NOTE — Progress Notes (Signed)
 Lindsay [name]   DOB:01-15-1956   UJ#:[id number]   NFA#:[id number]  Medical oncology follow-up note  Subjective: Patient is well-known to me, under my care for her metastatic colon cancer.  She is on chemotherapy.  She developed severe chest pain and became diaphoretic during her chemo yesterday in our office, and was sent to emergency room.  She was diagnosed with non-STEMI.  Her symptom resolved quickly after sublingual nitroglycerin, and she had no recurrent chest pain.  She feels well today.   Objective:  Vitals:   05/06/23 1323 05/06/23 1506  BP:  (!) 145/81  Pulse:  (!) 58  Resp:  18  Temp: 97.7 F (36.5 C) 97.7 F (36.5 C)  SpO2:  100%    Body mass index is 29.18 kg/m.  Intake/Output Summary (Last 24 hours) at 05/06/2023 1705 Last data filed at 05/06/2023 1649 Gross per 24 hour  Intake 720 ml  Output --  Net 720 ml     Sclerae unicteric  Oropharynx clear  No peripheral adenopathy  Lungs clear -- no rales or rhonchi  Heart regular rate and rhythm  Abdomen benign  MSK no focal spinal tenderness, no peripheral edema  Neuro nonfocal    CBG (last 3)  No results for input(s): "GLUCAP" in the last 72 hours.   Labs:  Lab Results  Component Value Date   WBC 9.6 05/06/2023   HGB 11.4 (L) 05/06/2023   HCT 35.8 (L) 05/06/2023   MCV 91.3 05/06/2023   PLT 294 05/06/2023   NEUTROABS 4.1 05/05/2023     Urine Studies No results for input(s): "UHGB", "CRYS" in the last 72 hours.  Invalid input(s): "UACOL", "UAPR", "USPG", "UPH", "UTP", "UGL", "UKET", "UBIL", "UNIT", "UROB", "ULEU", "UEPI", "UWBC", "URBC", "UBAC", "CAST", "UCOM", "BILUA"  Basic Metabolic Panel: Recent Labs  Lab 05/05/23 0844 05/05/23 1407 05/06/23 0343  NA 138 136 133*  K 3.9 3.9 4.0  CL 104 103 102  CO2 28 23 22   GLUCOSE 116* 155* 149*  BUN 13 12 17   CREATININE 0.63 0.62 0.64  CALCIUM 9.8 9.5 9.2   GFR Estimated Creatinine Clearance: 71.7 mL/min (by C-G formula based on SCr of 0.64  mg/dL). Liver Function Tests: Recent Labs  Lab 05/05/23 0844  AST 14*  ALT 13  ALKPHOS 109  BILITOT 0.3  PROT 7.5  ALBUMIN 4.2   No results for input(s): "LIPASE", "AMYLASE" in the last 168 hours. No results for input(s): "AMMONIA" in the last 168 hours. Coagulation profile No results for input(s): "INR", "PROTIME" in the last 168 hours.  CBC: Recent Labs  Lab 05/05/23 0844 05/05/23 1407 05/06/23 0343  WBC 6.8 8.4 9.6  NEUTROABS 4.1  --   --   HGB 11.8* 11.9* 11.4*  HCT 36.8 38.2 35.8*  MCV 90.2 92.9 91.3  PLT 306 302 294   Cardiac Enzymes: No results for input(s): "CKTOTAL", "CKMB", "CKMBINDEX", "TROPONINI" in the last 168 hours. BNP: Invalid input(s): "POCBNP" CBG: No results for input(s): "GLUCAP" in the last 168 hours. D-Dimer Recent Labs    05/05/23 1407  DDIMER 0.40   Hgb A1c No results for input(s): "HGBA1C" in the last 72 hours. Lipid Profile No results for input(s): "CHOL", "HDL", "LDLCALC", "TRIG", "CHOLHDL", "LDLDIRECT" in the last 72 hours. Thyroid function studies No results for input(s): "TSH", "T4TOTAL", "T3FREE", "THYROIDAB" in the last 72 hours.  Invalid input(s): "FREET3" Anemia work up No results for input(s): "VITAMINB12", "FOLATE", "FERRITIN", "TIBC", "IRON", "RETICCTPCT" in the last 72 hours. Microbiology No results found for  this or any previous visit (from the past 240 hours).    Studies:  ECHOCARDIOGRAM COMPLETE Result Date: 05/06/2023    ECHOCARDIOGRAM REPORT   Patient Name:   Lindsay [name] Date of Exam: 05/06/2023 Medical Rec #:  [id number]      Height:       66.0 in Accession #:    [id number]     Weight:       180.8 lb Date of Birth:  [birth year]      BSA:          1.916 m Patient Age:    68 years       BP:           120/68 mmHg Patient Gender: F              HR:           64 bpm. Exam Location:  Inpatient Procedure: 2D Echo, Cardiac Doppler and Color Doppler (Both Spectral and Color            Flow Doppler were utilized during  procedure). Indications:    CHF Acute Diastolic  History:        Patient has no prior history of Echocardiogram examinations.  Sonographer:    Karma Ganja Referring Phys: [id number] VISHAL R PATEL IMPRESSIONS  1. Left ventricular ejection fraction, by estimation, is 55 to 60%. The left ventricle has normal function. The left ventricle has no regional wall motion abnormalities. Left ventricular diastolic parameters are consistent with Grade II diastolic dysfunction (pseudonormalization).  2. Right ventricular systolic function is normal. The right ventricular size is normal.  3. Left atrial size was mildly dilated.  4. The mitral valve is normal in structure. Trivial mitral valve regurgitation. No evidence of mitral stenosis.  5. Visually calcified with decreased stroke volume index and DVI of 0.30 and acceleration time greater thant 100 ms. The aortic valve is calcified. Aortic valve regurgitation is not visualized. Moderate to severe aortic valve stenosis. Aortic valve area, by VTI measures 0.85 cm. Aortic valve mean gradient measures 20.0 mmHg. Aortic valve Vmax measures 3.07 m/s. Aortic valve acceleration time measures 105 msec. Comparison(s): No prior Echocardiogram. FINDINGS  Left Ventricle: Left ventricular ejection fraction, by estimation, is 55 to 60%. The left ventricle has normal function. The left ventricle has no regional wall motion abnormalities. Strain imaging was not performed. The left ventricular internal cavity  size was normal in size. There is no left ventricular hypertrophy. Left ventricular diastolic parameters are consistent with Grade II diastolic dysfunction (pseudonormalization). Right Ventricle: The right ventricular size is normal. No increase in right ventricular wall thickness. Right ventricular systolic function is normal. Left Atrium: Left atrial size was mildly dilated. Right Atrium: Right atrial size was normal in size. Pericardium: There is no evidence of pericardial effusion.  Mitral Valve: The mitral valve is normal in structure. Trivial mitral valve regurgitation. No evidence of mitral valve stenosis. Tricuspid Valve: The tricuspid valve is normal in structure. Tricuspid valve regurgitation is not demonstrated. Aortic Valve: Visually calcified with decreased stroke volume index and DVI of 0.30 and acceleration time greater thant 100 ms. The aortic valve is calcified. Aortic valve regurgitation is not visualized. Moderate to severe aortic stenosis is present. Aortic valve mean gradient measures 20.0 mmHg. Aortic valve peak gradient measures 37.7 mmHg. Aortic valve area, by VTI measures 0.85 cm. Pulmonic Valve: The pulmonic valve was normal in structure. Pulmonic valve regurgitation is not visualized. No evidence of pulmonic stenosis. Aorta: The aortic  root, ascending aorta and aortic arch are all structurally normal, with no evidence of dilitation or obstruction. IAS/Shunts: The atrial septum is grossly normal. Additional Comments: 3D imaging was not performed.  LEFT VENTRICLE PLAX 2D LVIDd:         4.60 cm   Diastology LVIDs:         2.90 cm   LV e' medial:    6.31 cm/s LV PW:         1.00 cm   LV E/e' medial:  17.6 LV IVS:        0.80 cm   LV e' lateral:   6.96 cm/s LVOT diam:     1.90 cm   LV E/e' lateral: 15.9 LV SV:         59 LV SV Index:   31 LVOT Area:     2.84 cm  RIGHT VENTRICLE RV Basal diam:  3.90 cm RV S prime:     9.90 cm/s TAPSE (M-mode): 2.4 cm LEFT ATRIUM             Index        RIGHT ATRIUM           Index LA diam:        4.10 cm 2.14 cm/m   RA Area:     14.80 cm LA Vol (A2C):   73.1 ml 38.16 ml/m  RA Volume:   35.70 ml  18.64 ml/m LA Vol (A4C):   56.4 ml 29.44 ml/m LA Biplane Vol: 65.5 ml 34.19 ml/m  AORTIC VALVE AV Area (Vmax):    0.85 cm AV Area (Vmean):   0.78 cm AV Area (VTI):     0.85 cm AV Vmax:           307.00 cm/s AV Vmean:          209.500 cm/s AV VTI:            0.691 m AV Peak Grad:      37.7 mmHg AV Mean Grad:      20.0 mmHg LVOT Vmax:          91.80 cm/s LVOT Vmean:        57.450 cm/s LVOT VTI:          0.207 m LVOT/AV VTI ratio: 0.30  AORTA Ao Root diam: 3.20 cm MITRAL VALVE MV Area (PHT): 3.08 cm     SHUNTS MV Decel Time: 246 msec     Systemic VTI:  0.21 m MV E velocity: 111.00 cm/s  Systemic Diam: 1.90 cm MV A velocity: 102.00 cm/s MV E/A ratio:  1.09 Riley Lam MD Electronically signed by Riley Lam MD Signature Date/Time: 05/06/2023/2:02:31 PM    Final    DG Chest Port 1 View Result Date: 05/05/2023 CLINICAL DATA:  Metastatic colon cancer, chest pain during chemotherapy EXAM: PORTABLE CHEST 1 VIEW COMPARISON:  03/24/2023, 07/21/2022 FINDINGS: Single frontal view of the chest demonstrates stable right chest wall port. The cardiac silhouette is unremarkable. No acute airspace disease, effusion, or pneumothorax. No acute displaced fractures. IMPRESSION: 1. No acute intrathoracic process. Electronically Signed   By: Sharlet Salina M.D.   On: 05/05/2023 15:28    Assessment: 68 y.o. female   Non-STEMI Metastatic colon cancer, on chemotherapy and bevacizumab Hypertension    Plan:  -Patient has been seen by cardiology service, she will be transferred to [facility] for cardiac catheterization tomorrow -If cath negative, this is possible related to her chemotherapy, especially bevacizumab.  I would likely  stop it -I will follow-up after her hospital discharge.   Malachy Mood, MD 05/06/2023  5:05 PM

## 2023-05-06 NOTE — ED Notes (Signed)
 Pt reports episode of angina, 0.4 mg nitroglycerin given

## 2023-05-06 NOTE — TOC Initial Note (Signed)
 Transition of Care [facility]) - Initial/Assessment Note    Patient Details  Name: Lindsay [name] MRN: [id number] Date of Birth: 01-02-1956  Transition of Care [facility]) CM/SW Contact:    Lanier Clam, RN Phone Number: 05/06/2023, 3:19 PM  Clinical Narrative: d/c plans home.                  Expected Discharge Plan: Home/Self Care Barriers to Discharge: Continued Medical Work up   Patient Goals and CMS Choice Patient states their goals for this hospitalization and ongoing recovery are:: Home CMS Medicare.gov Compare Post Acute Care list provided to:: Patient Choice offered to / list presented to : Patient [place] ownership interest in [facility].provided to:: Patient    Expected Discharge Plan and Services                                              Prior Living Arrangements/Services                       Activities of Daily Living      Permission Sought/Granted                  Emotional Assessment              Admission diagnosis:  NSTEMI (non-ST elevation myocardial infarction) [facility]) [I21.4] NSTEMI (non-ST elevated myocardial infarction) (HCC) [I21.4] Nonspecific chest pain [R07.9] Patient Active Problem List   Diagnosis Date Noted   NSTEMI (non-ST elevation myocardial infarction) (HCC) 05/05/2023   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   Sacroiliitis, not elsewhere classified (HCC) 09/18/2020   Hypertension 09/18/2020   Port-A-Cath in place 02/21/2020   Goals of care, counseling/discussion 01/29/2020   metastatic colon cancer to liver 01/01/2020   Osteoarthritis of left hip 08/06/2017   Pain of left hip joint 06/29/2017   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 [place] [address] Phone: [phone] Fax: [phone]  [facility] - [place], [place] - [address] [place] [address] Phone: [phone] Fax: [phone]  [facility] [place], [place] - [address] [place] [address] Phone: [phone] Fax: [phone]     Social Drivers of Health (SDOH) Social History: SDOH Screenings   Tobacco Use: Medium Risk (05/05/2023)   SDOH Interventions:     Readmission Risk Interventions     No data to display

## 2023-05-06 NOTE — Progress Notes (Signed)
  Echocardiogram 2D Echocardiogram has been performed.  Lindsay [name] 05/06/2023, 1:10 PM

## 2023-05-06 NOTE — Progress Notes (Signed)
 PHARMACY - ANTICOAGULATION CONSULT NOTE  Pharmacy Consult for heparin  Indication: chest pain/ACS  Allergies  Allergen Reactions   Oxaliplatin Nausea Only    Diaphoresis, hypotension, and bradycardia-patient was on 18th dose and had a reaction that sent her to the emergency room    Patient Measurements: Height: 5\' 6"  (167.6 cm) Weight: 82 kg (180 lb 12.4 oz) IBW/kg (Calculated) : 59.3 Heparin Dosing Weight: 76 kg  Vital Signs: Temp: 98.2 F (36.8 C) (02/27 0925) Temp Source: Oral (02/27 0925) BP: 120/86 (02/27 1008) Pulse Rate: 88 (02/27 1008)  Labs: Recent Labs    05/05/23 0844 05/05/23 1407 05/05/23 1407 05/05/23 1733 05/05/23 2133 05/06/23 0343 05/06/23 1015  HGB 11.8* 11.9*  --   --   --  11.4*  --   HCT 36.8 38.2  --   --   --  35.8*  --   PLT 306 302  --   --   --  294  --   HEPARINUNFRC  --   --   --   --   --  0.54 0.61  CREATININE 0.63 0.62  --   --   --  0.64  --   TROPONINIHS  --  74*   < > 221* 324* 314*  --    < > = values in this interval not displayed.    Estimated Creatinine Clearance: 71.7 mL/min (by C-G formula based on SCr of 0.64 mg/dL).   Medical History: Past Medical History:  Diagnosis Date   Arthritis    Diabetes mellitus without complication (HCC)    Hypertension    met colon ca to liver 01/2020    Assessment: Patient is a 68 y.o F with metastatic colon cancer who c/o CP while she was at [facility] on 05/05/23 for her chemotherapy treatment and subsequently transferred to the [facility] ED for further workup. Troponin was found to be elevated in the ED.  Pharmacy has been consulted to dose heparin drip for ACS.  Today, 05/06/2023: - heparin level 0.61 (therapeutic) with heparin gtt @ 900 units/hr - Hgb 11.4, pltc wnl - no bleeding or line issues reported by RN   Goal of Therapy:  Heparin level 0.3-0.7 units/ml Monitor platelets by anticoagulation protocol: Yes   Plan:  -Continue heparin gtt @ 900 units/hr - daily CBC and heparin level -  monitor for s/sx bleeding    Adalberto Cole, PharmD, BCPS 05/06/2023 12:11 PM

## 2023-05-06 NOTE — Consult Note (Signed)
 Cardiology Consultation   Patient ID: Lindsay [name] MRN: [id number]; DOB: 28-May-1955  Admit date: 05/05/2023 Date of Consult: 05/06/2023  PCP:  Ralene Ok, MD   [facility] Providers Cardiologist:  None        Patient Profile:   Lindsay [name] is a 68 y.o. female with a hx of metastatic colon cancer, hypertension, hyperlipidemia, type 2 diabetes, history of tobacco use (17-pack-year history and quit in 2020) who is being seen 05/06/2023 for the evaluation of chest pain at the request of Burnadette Pop MD.  History of Present Illness:   Lindsay [name] was at the [facility] yesterday for chemotherapy for metastatic colon cancer when she complained of chest pain.  Was transferred to the emergency department at [facility] for further workup.  In the emergency room troponins were elevated and trended 74 -> 221 -> 324 -> 314.  BMP shows a creatinine of 0.64 with a sodium of 133, slightly elevated glucose was otherwise unremarkable.  CBC showed a slightly decreased hemoglobin of 11.4 but was otherwise unremarkable.  The D-dimer was negative at 0.40. x-ray was negative for any acute cardiopulmonary concerns.  The metastatic colon cancer was diagnosed on 12/2019 by a lymph node biopsy.  Liver biopsy on 01/2020 confirmed that the colon cancer has metastasized to the liver.  Had an adverse reactions to FOLFOX, Vectibix, oxali, and Xeloda.  Is currently being treated with Folfiri and bevacizumab.  Appears to to have some fatigue, reflux, and neuropathy related to chemotherapy.  On physical exam Lindsay [name] affirmed the above history.  She had substernal pain yesterday before she was planning to get chemotherapy. But did not to do chemotherapy due to the chest pain. Reported having peanut butter crackers and a soda before the pain started.  Was 10 out of 10 and radiated up to the neck.  After taking a sublingual nitroglycerin the pain resolved.  Has not had any chest pain since.   Reported previous shortness of breath and some chest discomfort several times over the past few years that were worse with walking and resolves when sitting down.  Also reports having "gas" that causes chest discomfort for about the past year.  After relieving this "gas" the chest discomfort typically resolves.  Yesterday she reported that was unable to relieve the chest discomfort this way.  Chest discomfort was also more severe than the typical "gas."  Did having a raspy voice following chest discomfort yesterday.  Reported taking Protonix and other antiacid medications for the acid reflux.  Prior cardiac history On 10/2019 she had an office visit with Jeanella Cara MD for chest pain.  A nuclear stress test, carotid duplex ultrasound, and echo was done as workup.  Stress test showed normal myocardial perfusion.  Carotid artery duplex showed minimal stenosis and plaque bilaterally.  Echo showed mild LVH and LVEF of 62%.  Grade 2 diastolic dysfunction, elevated left atrial pressure, the aortic valve was most likely bicuspid with mild calcification had a mean gradient of 13 mmHg and a V-max of 2.5 m/s the area was estimated at 1.3 cm CT chest abdomen and pelvis January 2025 showed coronary artery calcifications  Past Medical History:  Diagnosis Date   Arthritis    Diabetes mellitus without complication (HCC)    Hypertension    met colon ca to liver 01/2020    Past Surgical History:  Procedure Laterality Date   ABDOMINAL HYSTERECTOMY     ANTERIOR AND POSTERIOR REPAIR WITH SACROSPINOUS  FIXATION N/A 07/16/2016   Procedure: ANTERIOR AND POSTERIOR REPAIR WITH SACROSPINOUS FIXATION;  Surgeon: Harold Hedge, MD;  Location: [facility];  Service: Gynecology;  Laterality: N/A;   CYSTOSCOPY  07/16/2016   Procedure: CYSTOSCOPY;  Surgeon: Harold Hedge, MD;  Location: [facility];  Service: Gynecology;;   ESOPHAGOGASTRODUODENOSCOPY (EGD) WITH PROPOFOL N/A 12/22/2019   Procedure: ESOPHAGOGASTRODUODENOSCOPY (EGD) WITH  PROPOFOL;  Surgeon: Jeani Hawking, MD;  Location: [facility];  Service: Endoscopy;  Laterality: N/A;   FINE NEEDLE ASPIRATION N/A 12/22/2019   Procedure: FINE NEEDLE ASPIRATION (FNA) LINEAR;  Surgeon: Jeani Hawking, MD;  Location: [facility];  Service: Endoscopy;  Laterality: N/A;   IR IMAGING GUIDED PORT INSERTION  01/24/2020   LAPAROSCOPIC VAGINAL HYSTERECTOMY WITH SALPINGECTOMY Bilateral 07/16/2016   Procedure: LAPAROSCOPIC ASSISTED VAGINAL HYSTERECTOMY WITH SALPINGECTOMY;  Surgeon: Harold Hedge, MD;  Location: [facility];  Service: Gynecology;  Laterality: Bilateral;   MYOMECTOMY ABDOMINAL APPROACH     THYROID SURGERY     tyroid     UPPER ESOPHAGEAL ENDOSCOPIC ULTRASOUND (EUS) N/A 12/22/2019   Procedure: UPPER ESOPHAGEAL ENDOSCOPIC ULTRASOUND (EUS);  Surgeon: Jeani Hawking, MD;  Location: [facility];  Service: Endoscopy;  Laterality: N/A;       Inpatient Medications: Scheduled Meds:  aspirin EC  81 mg Oral Daily   pantoprazole  20 mg Oral Daily   sodium chloride flush  3 mL Intravenous Q12H   verapamil  120 mg Oral Daily   Continuous Infusions:  heparin 900 Units/hr (05/06/23 0430)   PRN Meds: acetaminophen **OR** acetaminophen, nitroGLYCERIN, senna-docusate  Allergies:    Allergies  Allergen Reactions   Oxaliplatin Nausea Only    Diaphoresis, hypotension, and bradycardia-patient was on 18th dose and had a reaction that sent her to the emergency room    Social History:   Social History   Socioeconomic History   Marital status: Married    Spouse name: Not on file   Number of children: 1   Years of education: Not on file   Highest education level: Not on file  Occupational History   Occupation: retired Location manager   Tobacco Use   Smoking status: Former    Current packs/day: 0.00    Average packs/day: 0.3 packs/day for 18.0 years (4.5 ttl pk-yrs)    Types: Cigarettes    Start date: 06/05/2000    Quit date: 06/06/2018    Years since quitting: 4.9    Smokeless tobacco: Never  Vaping Use   Vaping status: Never Used  Substance and Sexual Activity   Alcohol use: Yes    Alcohol/week: 3.0 standard drinks of alcohol    Types: 3 Shots of liquor per week    Comment: socially   Drug use: No   Sexual activity: Yes  Other Topics Concern   Not on file  Social History Narrative   Not on file   Social Drivers of Health   Financial Resource Strain: Not on file  Food Insecurity: Not on file  Transportation Needs: Not on file  Physical Activity: Not on file  Stress: Not on file  Social Connections: Not on file  Intimate Partner Violence: Not on file    Family History:    Family History  Problem Relation Age of Onset   Aneurysm Mother    Asthma Father      ROS:  Please see the history of present illness.   All other ROS reviewed and negative.     Physical Exam/Data:   Vitals:   05/06/23 0130 05/06/23 0400 05/06/23  0432 05/06/23 0700  BP: 114/73 137/85  139/75  Pulse: 70 78  79  Resp: 17 16  19   Temp: 98.5 F (36.9 C)  (!) 97.3 F (36.3 C)   TempSrc:   Oral   SpO2: 100% 100%  100%  Weight:      Height:        Intake/Output Summary (Last 24 hours) at 05/06/2023 0743 Last data filed at 05/05/2023 2054 Gross per 24 hour  Intake 480 ml  Output --  Net 480 ml      05/05/2023    1:24 PM 05/05/2023    9:09 AM 04/07/2023    9:54 AM  Last 3 Weights  Weight (lbs) 180 lb 12.4 oz 180 lb 1.6 oz 181 lb 14.4 oz  Weight (kg) 82 kg 81.693 kg 82.509 kg     Body mass index is 29.18 kg/m.  General:  Well nourished, well developed, in no acute distress HEENT: normal Neck: no JVD Vascular:  Distal pulses 2+ bilaterally Cardiac:  normal S1, S2; RRR; 3 out of 6 systolic ejection murmur Lungs:  clear to auscultation bilaterally, no wheezing, rhonchi or rales  Abd: soft, nontender, no hepatomegaly  Ext: no edema Musculoskeletal:  No deformities, reported having some discomfort when pressing on the chest. Skin: warm and dry  Neuro:   CNs 2-12 intact, no focal abnormalities noted Psych:  Normal affect   EKG:  The EKG was personally reviewed and demonstrates:  normal sinus rhythm, poor R wave progression, was negative for any acute ischemic changes. Telemetry:  Telemetry was personally reviewed and demonstrates:  Normal sinus rhythm  Relevant CV Studies:  Prior TTE on 10/2019 Echocardiogram 11/03/2019:  Left ventricle cavity is normal in size. Mild concentric hypertrophy of  the left ventricle. Normal global wall motion. Normal LV systolic function  with EF 62%. Doppler evidence of grade II (pseudonormal) diastolic  dysfunction, elevated LAP.  Left atrial cavity is mildly dilated.  Probably bicuspid aortic valve with mild calcification. Aortic valve mean  gradient of 13 mmHg, Vmax of 2.5  m/s. Calculated aortic valve area by  continuity equation is 1.3 cm. No aortic valve regurgitation.  Mild (Grade I) mitral regurgitation.  Mild tricuspid regurgitation.  No evidence of pulmonary hypertension.   Prior nuclear stress on 10/2019 Lexiscan/modified Bruce Tetrofosmin stress test 11/01/2019: Lexiscan/modified Bruce nuclear stress test performed using 1-day protocol. Stress EKG is non-diagnostic, as this is pharmacological stress test. Stress EKG is non-diagnostic, as this is pharmacological stress test. In addition, stress EKG at 78% MPHR showed sinus tachycardia, <1 mm upsloping ST depression in leads II, III, V5, V6-equivocal for ischemia.  Normal stress myocardial perfusion. STress LVEF 50%. Low risk study.  Laboratory Data:  High Sensitivity Troponin:   Recent Labs  Lab 05/05/23 1407 05/05/23 1733 05/05/23 2133 05/06/23 0343  TROPONINIHS 74* 221* 324* 314*     Chemistry Recent Labs  Lab 05/05/23 0844 05/05/23 1407 05/06/23 0343  NA 138 136 133*  K 3.9 3.9 4.0  CL 104 103 102  CO2 28 23 22   GLUCOSE 116* 155* 149*  BUN 13 12 17   CREATININE 0.63 0.62 0.64  CALCIUM 9.8 9.5 9.2  GFRNONAA >60 >60 >60   ANIONGAP 6 10 9     Recent Labs  Lab 05/05/23 0844  PROT 7.5  ALBUMIN 4.2  AST 14*  ALT 13  ALKPHOS 109  BILITOT 0.3   Lipids No results for input(s): "CHOL", "TRIG", "HDL", "LABVLDL", "LDLCALC", "CHOLHDL" in the last 168 hours.  Hematology Recent Labs  Lab 05/05/23 0844 05/05/23 1407 05/06/23 0343  WBC 6.8 8.4 9.6  RBC 4.08 4.11 3.92  HGB 11.8* 11.9* 11.4*  HCT 36.8 38.2 35.8*  MCV 90.2 92.9 91.3  MCH 28.9 29.0 29.1  MCHC 32.1 31.2 31.8  RDW 17.2* 17.2* 17.3*  PLT 306 302 294   Thyroid No results for input(s): "TSH", "FREET4" in the last 168 hours.  BNPNo results for input(s): "BNP", "PROBNP" in the last 168 hours.  DDimer  Recent Labs  Lab 05/05/23 1407  DDIMER 0.40     Radiology/Studies:  DG Chest Port 1 View Result Date: 05/05/2023 CLINICAL DATA:  Metastatic colon cancer, chest pain during chemotherapy EXAM: PORTABLE CHEST 1 VIEW COMPARISON:  03/24/2023, 07/21/2022 FINDINGS: Single frontal view of the chest demonstrates stable right chest wall port. The cardiac silhouette is unremarkable. No acute airspace disease, effusion, or pneumothorax. No acute displaced fractures. IMPRESSION: 1. No acute intrathoracic process. Electronically Signed   By: Sharlet Salina M.D.   On: 05/05/2023 15:28     Assessment and Plan:   NSTEMI Substernal chest pain that radiated to the neck. elevated troponins 74 -> 221 -> 324 -> 314.   Had some chest discomfort and shortness of breath for several years that improves with sitting.  Has not had any chest pain since yesterday.  The EKG shows no acute ischemic changes. Nuclear stress test in 2021 was negative. Has a 17 pack year history of tobacco use and quit in 2020. Coronary calcifications were found on CT on 03/2023.  - due to high risk factors elevated troponin's and prior coronary calcification on CT. Order a left heart cath.  - Continue IV heparin - Continue 81 mg aspirin - Start metoprolol tartrate 12.5 mg twice daily, hold  home verapamil 120 mg daily. 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 If no CAD on LHC will consider treating for vasospastic angina. - Echo pending  Coronary artery disease - Found on CT chest and abdomen on January 2025 - Continue aspirin 81 mg daily - See hyperlipidemia below.  Hyperlipidemia - Lipid panel and LPA ordered -Start high intensity Lipitor 80 mg daily  -Goal of LDL less than 70  Hypertension - Hold verapamil 120 mg daily. Start Metoprolol tartrate 12.5 mg twice daily. -Blood pressure today seem to be down slightly.  Probable bicuspid aortic valve Echo showed mild LVH and LVEF of 62%.  Grade 2 diastolic dysfunction, elevated left atrial pressure, the aortic valve was most likely bicuspid with mild calcification had a mean gradient of 13 mmHg and a V-max of 2.5 m/s the area was estimated at 1.3 cm - Echo pending  Prior tobacco use Reported 17-pack-year history quit in 2020  Stage IV metastatic colon cancer Manage per primary  Severe acid reflux Managed per primary  Risk Assessment/Risk Scores:       For questions or updates, please contact [facility] Please consult [email or web address] for contact info under    Signed, Arabella Merles, PA-C  05/06/2023 7:43 AM

## 2023-05-07 ENCOUNTER — Ambulatory Visit (HOSPITAL_COMMUNITY): Admission: RE | Admit: 2023-05-07 | Payer: Medicare (Managed Care) | Source: Home / Self Care | Admitting: Cardiology

## 2023-05-07 ENCOUNTER — Encounter (HOSPITAL_COMMUNITY)
Admission: EM | Disposition: A | Discharge status: Home / Self Care | Payer: Self-pay | Source: Home / Self Care | Attending: Internal Medicine

## 2023-05-07 DIAGNOSIS — I2510 Atherosclerotic heart disease of native coronary artery without angina pectoris: Secondary | ICD-10-CM

## 2023-05-07 DIAGNOSIS — I214 Non-ST elevation (NSTEMI) myocardial infarction: Secondary | ICD-10-CM | POA: Diagnosis not present

## 2023-05-07 HISTORY — PX: LEFT HEART CATH AND CORONARY ANGIOGRAPHY: CATH118249

## 2023-05-07 LAB — CBC
HCT: 35.7 % — ABNORMAL LOW (ref 36.0–46.0)
Hemoglobin: 10.8 g/dL — ABNORMAL LOW (ref 12.0–15.0)
MCH: 28.5 pg (ref 26.0–34.0)
MCHC: 30.3 g/dL (ref 30.0–36.0)
MCV: 94.2 fL (ref 80.0–100.0)
Platelets: 272 10*3/uL (ref 150–400)
RBC: 3.79 MIL/uL — ABNORMAL LOW (ref 3.87–5.11)
RDW: 17.2 % — ABNORMAL HIGH (ref 11.5–15.5)
WBC: 11.1 10*3/uL — ABNORMAL HIGH (ref 4.0–10.5)
nRBC: 0 % (ref 0.0–0.2)

## 2023-05-07 LAB — LIPID PANEL
Cholesterol: 142 mg/dL (ref 0–200)
HDL: 55 mg/dL (ref >40)
LDL Cholesterol: 69 mg/dL (ref 0–99)
Total CHOL/HDL Ratio: 2.6 {ratio}
Triglycerides: 88 mg/dL (ref <150)
VLDL: 18 mg/dL (ref 0–40)

## 2023-05-07 LAB — HEPARIN LEVEL (UNFRACTIONATED): Heparin Unfractionated: 0.52 [IU]/mL (ref 0.30–0.70)

## 2023-05-07 SURGERY — LEFT HEART CATH AND CORONARY ANGIOGRAPHY
Anesthesia: LOCAL

## 2023-05-07 MED ORDER — SODIUM CHLORIDE 0.9 % IV SOLN: INTRAVENOUS | Status: AC

## 2023-05-07 MED ORDER — LIDOCAINE HCL (PF) 1 % IJ SOLN
INTRAMUSCULAR | Status: DC | PRN
  Administered 2023-05-07: 2 mL

## 2023-05-07 MED ORDER — SODIUM CHLORIDE 0.9% FLUSH
10.0000 mL | Freq: Two times a day (BID) | INTRAVENOUS | Status: DC
Start: 2023-05-07 — End: 2023-05-08

## 2023-05-07 MED ORDER — SODIUM CHLORIDE 0.9% FLUSH: 10.0000 mL | INTRAVENOUS | Status: DC | PRN

## 2023-05-07 MED ORDER — SODIUM CHLORIDE 0.9 % WEIGHT BASED INFUSION
3.0000 mL/kg/h | INTRAVENOUS | Status: DC
  Administered 2023-05-07: 3 mL/kg/h via INTRAVENOUS

## 2023-05-07 MED ORDER — DIPHENHYDRAMINE HCL 25 MG PO CAPS: 25.0000 mg | ORAL_CAPSULE | Freq: Three times a day (TID) | ORAL | Status: DC | PRN

## 2023-05-07 MED ORDER — ONDANSETRON HCL 4 MG/2ML IJ SOLN: 4.0000 mg | Freq: Four times a day (QID) | INTRAMUSCULAR | Status: DC | PRN

## 2023-05-07 MED ORDER — SODIUM CHLORIDE 0.9 % WEIGHT BASED INFUSION: 1.0000 mL/kg/h | INTRAVENOUS | Status: DC

## 2023-05-07 MED ORDER — HEPARIN (PORCINE) IN NACL 1000-0.9 UT/500ML-% IV SOLN
INTRAVENOUS | Status: DC | PRN
  Administered 2023-05-07 (×2): 500 mL

## 2023-05-07 MED ORDER — LABETALOL HCL 5 MG/ML IV SOLN: 10.0000 mg | INTRAVENOUS | Status: AC | PRN

## 2023-05-07 MED ORDER — HEPARIN SODIUM (PORCINE) 1000 UNIT/ML IJ SOLN
INTRAMUSCULAR | Status: DC | PRN
  Administered 2023-05-07: 4000 [IU] via INTRAVENOUS

## 2023-05-07 MED ORDER — VERAPAMIL HCL 2.5 MG/ML IV SOLN
INTRAVENOUS | Status: AC
  Filled 2023-05-07: qty 2

## 2023-05-07 MED ORDER — HYDRALAZINE HCL 20 MG/ML IJ SOLN: 10.0000 mg | INTRAMUSCULAR | Status: AC | PRN

## 2023-05-07 MED ORDER — IOHEXOL 350 MG/ML SOLN
INTRAVENOUS | Status: DC | PRN
  Administered 2023-05-07: 55 mL

## 2023-05-07 MED ORDER — LIDOCAINE HCL (PF) 1 % IJ SOLN
INTRAMUSCULAR | Status: AC
  Filled 2023-05-07: qty 30

## 2023-05-07 MED ORDER — VERAPAMIL HCL 2.5 MG/ML IV SOLN
INTRAVENOUS | Status: DC | PRN
  Administered 2023-05-07: 10 mL via INTRA_ARTERIAL

## 2023-05-07 MED ORDER — SODIUM CHLORIDE 0.9% FLUSH: 3.0000 mL | Freq: Two times a day (BID) | INTRAVENOUS | Status: DC

## 2023-05-07 MED ORDER — SODIUM CHLORIDE 0.9 % WEIGHT BASED INFUSION: 3.0000 mL/kg/h | INTRAVENOUS | Status: DC

## 2023-05-07 MED ORDER — SODIUM CHLORIDE 0.9% FLUSH
10.0000 mL | Freq: Two times a day (BID) | INTRAVENOUS | Status: DC
  Administered 2023-05-08: 10 mL

## 2023-05-07 MED ORDER — SODIUM CHLORIDE 0.9 % IV SOLN: 250.0000 mL | INTRAVENOUS | Status: DC | PRN

## 2023-05-07 MED ORDER — SODIUM CHLORIDE 0.9% FLUSH: 3.0000 mL | INTRAVENOUS | Status: DC | PRN

## 2023-05-07 MED ORDER — CHLORHEXIDINE GLUCONATE CLOTH 2 % EX PADS
6.0000 | MEDICATED_PAD | Freq: Every day | CUTANEOUS | Status: DC
  Administered 2023-05-07 – 2023-05-08 (×2): 6 via TOPICAL

## 2023-05-07 MED ORDER — HEPARIN SODIUM (PORCINE) 1000 UNIT/ML IJ SOLN
INTRAMUSCULAR | Status: AC
  Filled 2023-05-07: qty 10

## 2023-05-07 MED ORDER — SODIUM CHLORIDE 0.9 % WEIGHT BASED INFUSION
1.0000 mL/kg/h | INTRAVENOUS | Status: DC
  Administered 2023-05-07: 1 mL/kg/h via INTRAVENOUS

## 2023-05-07 SURGICAL SUPPLY — 12 items
CATH 5FR JL3.5 JR4 ANG PIG MP (CATHETERS) IMPLANT
CATH INFINITI 5FR AL1 (CATHETERS) IMPLANT
CATH LAUNCHER 5F EBU3.0 (CATHETERS) IMPLANT
CATH LAUNCHER 5F JR4 (CATHETERS) IMPLANT
CATHETER LAUNCHER 5F EBU3.0 (CATHETERS) ×1 IMPLANT
DEVICE RAD COMP TR BAND LRG (VASCULAR PRODUCTS) IMPLANT
GLIDESHEATH SLEND SS 6F .021 (SHEATH) IMPLANT
GUIDEWIRE INQWIRE 1.5J.035X260 (WIRE) IMPLANT
INQWIRE 1.5J .035X260CM (WIRE) ×1 IMPLANT
PACK CARDIAC CATHETERIZATION (CUSTOM PROCEDURE TRAY) ×1 IMPLANT
SET ATX-X65L (MISCELLANEOUS) IMPLANT
SHEATH PROBE COVER 6X72 (BAG) IMPLANT

## 2023-05-07 NOTE — Interval H&P Note (Signed)
 History and Physical Interval Note:  05/07/2023 4:14 PM  Lindsay [name]  has presented today for surgery, with the diagnosis of NSTEMI.  The various methods of treatment have been discussed with the patient and family. After consideration of risks, benefits and other options for treatment, the patient has consented to  Procedure(s): LEFT HEART CATH AND CORONARY ANGIOGRAPHY (N/A)  PERCUTANEOUS CORONARY INTERVENTION  as a surgical intervention.  The patient's history has been reviewed, patient examined, no change in status, stable for surgery.  I have reviewed the patient's chart and labs.  Questions were answered to the patient's satisfaction.    Cath Lab Visit (complete for each Cath Lab visit)  Clinical Evaluation Leading to the Procedure:   ACS: Yes.    Non-ACS:    Anginal Classification: CCS III  Anti-ischemic medical therapy: Minimal Therapy (1 class of medications)  Non-Invasive Test Results: No non-invasive testing performed  Prior CABG: No previous CABG    [name]

## 2023-05-07 NOTE — Progress Notes (Signed)
 TR BAND REMOVAL  LOCATION:    Right radial  DEFLATED PER PROTOCOL:    Yes.    TIME BAND OFF / DRESSING APPLIED:    1915pm a clean dry dressing applied with gauze and tegaderm, secured with coban   SITE UPON ARRIVAL:    Level 0  SITE AFTER BAND REMOVAL:    Level 0  CIRCULATION SENSATION AND MOVEMENT:    Within Normal Limits   Yes.    COMMENTS:   Care instructions given to patient

## 2023-05-07 NOTE — Plan of Care (Signed)

## 2023-05-07 NOTE — Progress Notes (Signed)
 PHARMACY - ANTICOAGULATION CONSULT NOTE  Pharmacy Consult for heparin  Indication: chest pain/ACS  Allergies  Allergen Reactions   Oxaliplatin Nausea Only    Diaphoresis, hypotension, and bradycardia-patient was on 18th dose and had a reaction that sent her to the emergency room    Patient Measurements: Height: 5\' 6"  (167.6 cm) Weight: 82 kg (180 lb 12.4 oz) IBW/kg (Calculated) : 59.3 Heparin Dosing Weight: 76 kg  Vital Signs: Temp: 98 F (36.7 C) (02/28 0308) Temp Source: Oral (02/28 0308) BP: 124/73 (02/28 0308) Pulse Rate: 64 (02/28 0308)  Labs: Recent Labs    05/05/23 0844 05/05/23 0844 05/05/23 1407 05/05/23 1733 05/05/23 2133 05/06/23 0343 05/06/23 1015 05/07/23 0448  HGB 11.8*   < > 11.9*  --   --  11.4*  --  10.8*  HCT 36.8  --  38.2  --   --  35.8*  --  35.7*  PLT 306  --  302  --   --  294  --  272  HEPARINUNFRC  --   --   --   --   --  0.54 0.61 0.52  CREATININE 0.63  --  0.62  --   --  0.64  --   --   TROPONINIHS  --    < > 74* 221* 324* 314*  --   --    < > = values in this interval not displayed.    Estimated Creatinine Clearance: 71.7 mL/min (by C-G formula based on SCr of 0.64 mg/dL).   Medical History: Past Medical History:  Diagnosis Date   Arthritis    Diabetes mellitus without complication (HCC)    Hypertension    met colon ca to liver 01/2020    Assessment: Patient is a 67 y.o F with metastatic colon cancer who c/o CP while she was at [facility] on 05/05/23 for her chemotherapy treatment and subsequently transferred to the [facility] ED for further workup. Troponin was found to be elevated in the ED.  Pharmacy has been consulted to dose heparin drip for NSTEMI  Today, 05/07/2023: - heparin level continues to be therapeutic on current heparin gtt rate of 900 units/hr - CBC stable - no bleeding or line issues reported by RN   Goal of Therapy:  Heparin level 0.3-0.7 units/ml Monitor platelets by anticoagulation protocol: Yes   Plan:  - Continue  heparin gtt @ 900 units/hr - daily CBC and heparin level - monitor for s/sx bleeding    Hessie Knows, PharmD, BCPS Secure Chat if ?s 05/07/2023 8:41 AM

## 2023-05-07 NOTE — Progress Notes (Signed)
 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  At baseline, she has GERD with heartburn symptoms.  On presentation, she was hemodynamically stable.  Lab work showed elevated troponin with positive delta.   Cardiology consulted.  Started on aspirin, heparin.  Plan for cardiac cath  Assessment & Plan:  Principal Problem:   NSTEMI (non-ST elevation myocardial infarction) [facility]) Active Problems:   metastatic colon cancer to liver   Hypertension  Chest pain/elevated troponin: Presented with sudden chest discomfort before getting chemotherapy.  Patient has history of GERD/severe heartburn.  EKG did not show any ischemic changes.  Troponins elevated with positive delta.  Cardio consulted,.  Currently on IV heparin.  Started aspirin 81 mg daily. Patient currently chest pain-free .  Cardiology planning for cardiac cath.  Troponins now plateued in the range of 300s. CT imaging done as an outpatient on 1/15 had showed coronary calcification. Echo done here showed normal left ventricular function, no wall motion abnormality, grade 2 diastolic dysfunction. LDL of 69  Stage IV colon cancer: Mets to liver.  Currently on chemotherapy.  Follows with Dr. Mosetta Putt.  Has a chemo port  Hypertension: On Verapamil         DVT prophylaxis:iv heparin     Code Status: Full Code  Family Communication: None at bedside  Patient status:Inpatient  Patient is from :home  Anticipated discharge WJ:[id number]  Estimated DC date: After full cardiac workup   Consultants: Cardiology  Procedures: None  Antimicrobials:  Anti-infectives (From admission, onward)    None       Subjective: Patient seen and examined at bedside today.  Very  comfortable.  Sitting on the bed.  No chest pain.  Plan for cardiac cath  Objective: Vitals:   05/06/23 1858 05/06/23 2304 05/07/23 0308 05/07/23 0924  BP: 118/82 129/74 124/73 116/71  Pulse: 65 61 64 65  Resp: 11 18 18 18   Temp: 99.2 F (37.3 C) 97.9 F (36.6 C) 98 F (36.7 C) 98.1 F (36.7 C)  TempSrc: Oral Oral Oral Oral  SpO2: 100% 100% 98% 100%  Weight:      Height:        Intake/Output Summary (Last 24 hours) at 05/07/2023 1134 Last data filed at 05/07/2023 0602 Gross per 24 hour  Intake 830.06 ml  Output --  Net 830.06 ml   Filed Weights   05/05/23 1324  Weight: 82 kg    Examination:   General exam: Overall comfortable, not in distress HEENT: PERRL Respiratory system:  no wheezes or crackles  Cardiovascular system: S1 & S2 heard, RRR.  Chemo-Port on the right chest Gastrointestinal system: Abdomen is nondistended, soft and nontender. Central nervous system: Alert and oriented Extremities: No edema, no clubbing ,no cyanosis Skin: No rashes, no ulcers,no icterus     Data Reviewed: I have personally reviewed following labs and imaging studies  CBC: Recent Labs  Lab 05/05/23 0844 05/05/23 1407 05/06/23 0343 05/07/23 0448  WBC 6.8 8.4 9.6 11.1*  NEUTROABS 4.1  --   --   --   HGB 11.8* 11.9* 11.4* 10.8*  HCT 36.8 38.2 35.8* 35.7*  MCV 90.2 92.9 91.3 94.2  PLT 306 302 294 272   Basic Metabolic Panel: Recent Labs  Lab 05/05/23 0844 05/05/23 1407 05/06/23  0343  NA 138 136 133*  K 3.9 3.9 4.0  CL 104 103 102  CO2 28 23 22   GLUCOSE 116* 155* 149*  BUN 13 12 17   CREATININE 0.63 0.62 0.64  CALCIUM 9.8 9.5 9.2     No results found for this or any previous visit (from the past 240 hours).   Radiology Studies: ECHOCARDIOGRAM COMPLETE Result Date: 05/06/2023    ECHOCARDIOGRAM REPORT   Patient Name:   PAELYN SMICK Date of Exam: 05/06/2023 Medical Rec #:  [id number]      Height:       66.0 in Accession #:    [id number]     Weight:       180.8 lb Date  of Birth:  07-18-1955      BSA:          1.916 m Patient Age:    67 years       BP:           120/68 mmHg Patient Gender: F              HR:           64 bpm. Exam Location:  Inpatient Procedure: 2D Echo, Cardiac Doppler and Color Doppler (Both Spectral and Color            Flow Doppler were utilized during procedure). Indications:    CHF Acute Diastolic  History:        Patient has no prior history of Echocardiogram examinations.  Sonographer:    Karma Ganja Referring Phys: [id number] VISHAL R PATEL IMPRESSIONS  1. Left ventricular ejection fraction, by estimation, is 55 to 60%. The left ventricle has normal function. The left ventricle has no regional wall motion abnormalities. Left ventricular diastolic parameters are consistent with Grade II diastolic dysfunction (pseudonormalization).  2. Right ventricular systolic function is normal. The right ventricular size is normal.  3. Left atrial size was mildly dilated.  4. The mitral valve is normal in structure. Trivial mitral valve regurgitation. No evidence of mitral stenosis.  5. Visually calcified with decreased stroke volume index and DVI of 0.30 and acceleration time greater thant 100 ms. The aortic valve is calcified. Aortic valve regurgitation is not visualized. Moderate to severe aortic valve stenosis. Aortic valve area, by VTI measures 0.85 cm. Aortic valve mean gradient measures 20.0 mmHg. Aortic valve Vmax measures 3.07 m/s. Aortic valve acceleration time measures 105 msec. Comparison(s): No prior Echocardiogram. FINDINGS  Left Ventricle: Left ventricular ejection fraction, by estimation, is 55 to 60%. The left ventricle has normal function. The left ventricle has no regional wall motion abnormalities. Strain imaging was not performed. The left ventricular internal cavity  size was normal in size. There is no left ventricular hypertrophy. Left ventricular diastolic parameters are consistent with Grade II diastolic dysfunction (pseudonormalization). Right  Ventricle: The right ventricular size is normal. No increase in right ventricular wall thickness. Right ventricular systolic function is normal. Left Atrium: Left atrial size was mildly dilated. Right Atrium: Right atrial size was normal in size. Pericardium: There is no evidence of pericardial effusion. Mitral Valve: The mitral valve is normal in structure. Trivial mitral valve regurgitation. No evidence of mitral valve stenosis. Tricuspid Valve: The tricuspid valve is normal in structure. Tricuspid valve regurgitation is not demonstrated. Aortic Valve: Visually calcified with decreased stroke volume index and DVI of 0.30 and acceleration time greater thant 100 ms. The aortic valve is calcified. Aortic valve regurgitation is not visualized. Moderate to severe aortic  stenosis is present. Aortic valve mean gradient measures 20.0 mmHg. Aortic valve peak gradient measures 37.7 mmHg. Aortic valve area, by VTI measures 0.85 cm. Pulmonic Valve: The pulmonic valve was normal in structure. Pulmonic valve regurgitation is not visualized. No evidence of pulmonic stenosis. Aorta: The aortic root, ascending aorta and aortic arch are all structurally normal, with no evidence of dilitation or obstruction. IAS/Shunts: The atrial septum is grossly normal. Additional Comments: 3D imaging was not performed.  LEFT VENTRICLE PLAX 2D LVIDd:         4.60 cm   Diastology LVIDs:         2.90 cm   LV e' medial:    6.31 cm/s LV PW:         1.00 cm   LV E/e' medial:  17.6 LV IVS:        0.80 cm   LV e' lateral:   6.96 cm/s LVOT diam:     1.90 cm   LV E/e' lateral: 15.9 LV SV:         59 LV SV Index:   31 LVOT Area:     2.84 cm  RIGHT VENTRICLE RV Basal diam:  3.90 cm RV S prime:     9.90 cm/s TAPSE (M-mode): 2.4 cm LEFT ATRIUM             Index        RIGHT ATRIUM           Index LA diam:        4.10 cm 2.14 cm/m   RA Area:     14.80 cm LA Vol (A2C):   73.1 ml 38.16 ml/m  RA Volume:   35.70 ml  18.64 ml/m LA Vol (A4C):   56.4 ml 29.44  ml/m LA Biplane Vol: 65.5 ml 34.19 ml/m  AORTIC VALVE AV Area (Vmax):    0.85 cm AV Area (Vmean):   0.78 cm AV Area (VTI):     0.85 cm AV Vmax:           307.00 cm/s AV Vmean:          209.500 cm/s AV VTI:            0.691 m AV Peak Grad:      37.7 mmHg AV Mean Grad:      20.0 mmHg LVOT Vmax:         91.80 cm/s LVOT Vmean:        57.450 cm/s LVOT VTI:          0.207 m LVOT/AV VTI ratio: 0.30  AORTA Ao Root diam: 3.20 cm MITRAL VALVE MV Area (PHT): 3.08 cm     SHUNTS MV Decel Time: 246 msec     Systemic VTI:  0.21 m MV E velocity: 111.00 cm/s  Systemic Diam: 1.90 cm MV A velocity: 102.00 cm/s MV E/A ratio:  1.09 Riley Lam MD Electronically signed by Riley Lam MD Signature Date/Time: 05/06/2023/2:02:31 PM    Final    DG Chest Port 1 View Result Date: 05/05/2023 CLINICAL DATA:  Metastatic colon cancer, chest pain during chemotherapy EXAM: PORTABLE CHEST 1 VIEW COMPARISON:  03/24/2023, 07/21/2022 FINDINGS: Single frontal view of the chest demonstrates stable right chest wall port. The cardiac silhouette is unremarkable. No acute airspace disease, effusion, or pneumothorax. No acute displaced fractures. IMPRESSION: 1. No acute intrathoracic process. Electronically Signed   By: Sharlet Salina M.D.   On: 05/05/2023 15:28    Scheduled Meds:  aspirin EC  81 mg Oral Daily   atorvastatin  80 mg Oral Daily   Chlorhexidine Gluconate Cloth  6 each Topical Daily   metoprolol tartrate  12.5 mg Oral BID   pantoprazole  20 mg Oral Daily   polyethylene glycol  17 g Oral Daily   senna-docusate  1 tablet Oral BID   sodium chloride flush  10-40 mL Intracatheter Q12H   sodium chloride flush  10-40 mL Intracatheter Q12H   sodium chloride flush  3 mL Intravenous Q12H   Continuous Infusions:  sodium chloride 1 mL/kg/hr (05/07/23 0602)   heparin 900 Units/hr (05/07/23 0506)     LOS: 2 days   Burnadette Pop, MD [facility] P2/28/2025, 11:34 AM

## 2023-05-07 NOTE — H&P (View-Only) (Signed)
   Patient Name: Lindsay [name] Date of Encounter: 05/07/2023 [facility] Cardiologist: Yates Decamp, MD   Interval Summary  .    Patient reports feeling well this AM. No recurrence of chest pain. No shortness of breath or palpitations. She is scheduled for cath today. Denies known history of cardiac murmur   Vital Signs .    Vitals:   05/06/23 1506 05/06/23 1858 05/06/23 2304 05/07/23 0308  BP: (!) 145/81 118/82 129/74 124/73  Pulse: (!) 58 65 61 64  Resp: 18 11 18 18   Temp: 97.7 F (36.5 C) 99.2 F (37.3 C) 97.9 F (36.6 C) 98 F (36.7 C)  TempSrc: Oral Oral Oral Oral  SpO2: 100% 100% 100% 98%  Weight:      Height:        Intake/Output Summary (Last 24 hours) at 05/07/2023 0756 Last data filed at 05/07/2023 0602 Gross per 24 hour  Intake 830.06 ml  Output --  Net 830.06 ml      05/05/2023    1:24 PM 05/05/2023    9:09 AM 04/07/2023    9:54 AM  Last 3 Weights  Weight (lbs) 180 lb 12.4 oz 180 lb 1.6 oz 181 lb 14.4 oz  Weight (kg) 82 kg 81.693 kg 82.509 kg      Telemetry/ECG    Sinus rhythm, HR in the 50s-60s - Personally Reviewed  Physical Exam .   GEN: No acute distress.  Sitting upright in the bed  Neck: No JVD Cardiac:  RRR.  Grade 2-3/6 systolic murmur  Respiratory: Clear to auscultation bilaterally. Normal WOB on room air  GI: Soft, nontender  MS: No edema in BLE   Assessment & Plan .     NSTEMI  CAD  - Patient presented complaining of substernal chest pain that radiated to the neck. hsTn 74>221>324>314. EKG without acute ischemic changes  - CT chest in 03/2023 noted coronary calcifications. Patient also has a history of tobacco use, HTN, HLD - Overall, presentation concerning for NSTEMI - Continue IV heparin  - Continue ASA 81 mg daily  - Continue metoprolol tartrate 12.5 mg BID  - Continue lipitor 80 mg daily  - Scheduled for cardiac catheterization today. 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 If no obstructive disease noted on cath, will treat for vasospasm with CCB  - Echocardiogram this admission with EF 55-60%, no regional wall motion abnormalities, grade II DD, normal RV function   Aortic Stenosis / Possible Bicuspid Aortic Valve  - Previously, echocardiogram in 10/2019 showed a probably bicuspid aortic valve with mild calcification, mean gradient of 13 mmHg - Echocardiogram this admission showed that the aortic valve is visually calcified with decreased stroke volume index and DVI of 0.30. Acceleration time greater than 100 ms. Moderate to severe aortic stenosis with mean gradient 20.0 - Patient scheduled for cath today  - Consider CT to better investigate aortic valve leaflets   HLD  - Lipid panel pending  - Continue lipitor 80 mg daily. Needs LFTs, lipid panel in 8 weeks    HTN  - BP overall well controlled   Otherwise per primary  - GERD - Stage IV metastatic colon cancer   For questions or updates, please contact [facility] Please consult [email or web address] for contact info under        Signed, Jonita Albee, PA-C

## 2023-05-07 NOTE — Progress Notes (Signed)
   Patient Name: Lindsay [name] Date of Encounter: 05/07/2023 [facility] Cardiologist: Yates Decamp, MD   Interval Summary  .    Patient reports feeling well this AM. No recurrence of chest pain. No shortness of breath or palpitations. She is scheduled for cath today. Denies known history of cardiac murmur   Vital Signs .    Vitals:   05/06/23 1506 05/06/23 1858 05/06/23 2304 05/07/23 0308  BP: (!) 145/81 118/82 129/74 124/73  Pulse: (!) 58 65 61 64  Resp: 18 11 18 18   Temp: 97.7 F (36.5 C) 99.2 F (37.3 C) 97.9 F (36.6 C) 98 F (36.7 C)  TempSrc: Oral Oral Oral Oral  SpO2: 100% 100% 100% 98%  Weight:      Height:        Intake/Output Summary (Last 24 hours) at 05/07/2023 0756 Last data filed at 05/07/2023 0602 Gross per 24 hour  Intake 830.06 ml  Output --  Net 830.06 ml      05/05/2023    1:24 PM 05/05/2023    9:09 AM 04/07/2023    9:54 AM  Last 3 Weights  Weight (lbs) 180 lb 12.4 oz 180 lb 1.6 oz 181 lb 14.4 oz  Weight (kg) 82 kg 81.693 kg 82.509 kg      Telemetry/ECG    Sinus rhythm, HR in the 50s-60s - Personally Reviewed  Physical Exam .   GEN: No acute distress.  Sitting upright in the bed  Neck: No JVD Cardiac:  RRR.  Grade 2-3/6 systolic murmur  Respiratory: Clear to auscultation bilaterally. Normal WOB on room air  GI: Soft, nontender  MS: No edema in BLE   Assessment & Plan .     NSTEMI  CAD  - Patient presented complaining of substernal chest pain that radiated to the neck. hsTn 74>221>324>314. EKG without acute ischemic changes  - CT chest in 03/2023 noted coronary calcifications. Patient also has a history of tobacco use, HTN, HLD - Overall, presentation concerning for NSTEMI - Continue IV heparin  - Continue ASA 81 mg daily  - Continue metoprolol tartrate 12.5 mg BID  - Continue lipitor 80 mg daily  - Scheduled for cardiac catheterization today. 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 If no obstructive disease noted on cath, will treat for vasospasm with CCB  - Echocardiogram this admission with EF 55-60%, no regional wall motion abnormalities, grade II DD, normal RV function   Aortic Stenosis / Possible Bicuspid Aortic Valve  - Previously, echocardiogram in 10/2019 showed a probably bicuspid aortic valve with mild calcification, mean gradient of 13 mmHg - Echocardiogram this admission showed that the aortic valve is visually calcified with decreased stroke volume index and DVI of 0.30. Acceleration time greater than 100 ms. Moderate to severe aortic stenosis with mean gradient 20.0 - Patient scheduled for cath today  - Consider CT to better investigate aortic valve leaflets   HLD  - Lipid panel pending  - Continue lipitor 80 mg daily. Needs LFTs, lipid panel in 8 weeks    HTN  - BP overall well controlled   Otherwise per primary  - GERD - Stage IV metastatic colon cancer   For questions or updates, please contact [facility] Please consult [email or web address] for contact info under        Signed, Jonita Albee, PA-C

## 2023-05-08 ENCOUNTER — Other Ambulatory Visit (HOSPITAL_COMMUNITY): Payer: Self-pay

## 2023-05-08 ENCOUNTER — Encounter: Payer: Self-pay | Admitting: Hematology

## 2023-05-08 DIAGNOSIS — I214 Non-ST elevation (NSTEMI) myocardial infarction: Secondary | ICD-10-CM | POA: Diagnosis not present

## 2023-05-08 MED ORDER — HEPARIN SOD (PORK) LOCK FLUSH 100 UNIT/ML IV SOLN
500.0000 [IU] | INTRAVENOUS | Status: AC | PRN
  Administered 2023-05-08: 500 [IU]

## 2023-05-08 MED ORDER — METOPROLOL TARTRATE 25 MG PO TABS
12.5000 mg | ORAL_TABLET | Freq: Two times a day (BID) | ORAL | 0 refills | Status: DC
  Filled 2023-05-08: qty 30, 30d supply, fill #0

## 2023-05-08 MED ORDER — IPRATROPIUM-ALBUTEROL 0.5-2.5 (3) MG/3ML IN SOLN: 3.0000 mL | RESPIRATORY_TRACT | Status: DC | PRN

## 2023-05-08 MED ORDER — HYDRALAZINE HCL 20 MG/ML IJ SOLN: 10.0000 mg | INTRAMUSCULAR | Status: DC | PRN

## 2023-05-08 MED ORDER — BISACODYL 5 MG PO TBEC: 10.0000 mg | DELAYED_RELEASE_TABLET | Freq: Every day | ORAL | Status: DC | PRN

## 2023-05-08 MED ORDER — METOPROLOL TARTRATE 5 MG/5ML IV SOLN: 5.0000 mg | INTRAVENOUS | Status: DC | PRN

## 2023-05-08 MED ORDER — DOCUSATE SODIUM 100 MG PO CAPS
100.0000 mg | ORAL_CAPSULE | Freq: Two times a day (BID) | ORAL | Status: DC
  Administered 2023-05-08: 100 mg via ORAL
  Filled 2023-05-08: qty 1

## 2023-05-08 MED ORDER — MAGNESIUM HYDROXIDE 400 MG/5ML PO SUSP
15.0000 mL | Freq: Once | ORAL | Status: AC
  Administered 2023-05-08: 15 mL via ORAL
  Filled 2023-05-08: qty 30

## 2023-05-08 MED ORDER — TRAZODONE HCL 50 MG PO TABS: 50.0000 mg | ORAL_TABLET | Freq: Every evening | ORAL | Status: DC | PRN

## 2023-05-08 MED ORDER — ASPIRIN 81 MG PO TBEC
81.0000 mg | DELAYED_RELEASE_TABLET | Freq: Every day | ORAL | 0 refills | Status: AC
  Filled 2023-05-08: qty 90, 90d supply, fill #0

## 2023-05-08 MED ORDER — ATORVASTATIN CALCIUM 80 MG PO TABS
80.0000 mg | ORAL_TABLET | Freq: Every day | ORAL | 0 refills | Status: AC
  Filled 2023-05-08: qty 90, 90d supply, fill #0

## 2023-05-08 MED ORDER — CLOPIDOGREL BISULFATE 75 MG PO TABS
75.0000 mg | ORAL_TABLET | Freq: Every day | ORAL | 0 refills | Status: AC
  Filled 2023-05-08: qty 90, 90d supply, fill #0

## 2023-05-08 MED ORDER — ENOXAPARIN SODIUM 40 MG/0.4ML IJ SOSY
40.0000 mg | PREFILLED_SYRINGE | INTRAMUSCULAR | Status: DC
  Administered 2023-05-08: 40 mg via SUBCUTANEOUS
  Filled 2023-05-08: qty 0.4

## 2023-05-08 MED ORDER — CLOPIDOGREL BISULFATE 75 MG PO TABS
75.0000 mg | ORAL_TABLET | Freq: Every day | ORAL | Status: DC
  Administered 2023-05-08: 75 mg via ORAL
  Filled 2023-05-08: qty 1

## 2023-05-08 NOTE — Progress Notes (Signed)
   Patient Name: Lindsay [name] Date of Encounter: 05/08/2023 [facility] Cardiologist: Yates Decamp, MD   Interval Summary  .   She feels much better and ready to go home.  Vital Signs .    Vitals:   05/08/23 0028 05/08/23 0445 05/08/23 0500 05/08/23 0838  BP: 104/67 117/73  114/65  Pulse: 60 (!) 59  67  Resp: 18 16  14   Temp: 98.2 F (36.8 C) 98.7 F (37.1 C)  98.4 F (36.9 C)  TempSrc: Oral Oral  Oral  SpO2: 97% 100%  100%  Weight:   84.1 kg   Height:        Intake/Output Summary (Last 24 hours) at 05/08/2023 0923 Last data filed at 05/08/2023 0900 Gross per 24 hour  Intake 708.23 ml  Output --  Net 708.23 ml      05/08/2023    5:00 AM 05/05/2023    1:24 PM 05/05/2023    9:09 AM  Last 3 Weights  Weight (lbs) 185 lb 6.5 oz 180 lb 12.4 oz 180 lb 1.6 oz  Weight (kg) 84.1 kg 82 kg 81.693 kg      Telemetry/ECG    Sinus rhythm- Personally Reviewed  Physical Exam .   GEN: No acute distress.  Sitting upright in the bed  Neck: No JVD Cardiac:  RRR.  Grade 2-3/6 systolic murmur  Respiratory: Clear to auscultation bilaterally. Normal WOB on room air  GI: Soft, nontender  MS: No edema in BLE   Assessment & Plan .     NSTEMI  CAD  Patient presented complaining of substernal chest pain that radiated to the neck. hsTn 74>221>324>314. EKG without acute ischemic changes . CT chest in 03/2023 noted coronary calcifications. Patient also has a history of tobacco use, HTN, HLD -Left heart cath showed 90% stenosed lesion in her ramus, the whole vessel had significant disease.  Proximal RCA had 75% stenosis.  She was noted to have small caliber vessels and PCI was deemed high risk.  Will plan to treat medically.   - Continue DAPT - Continue metoprolol tartrate 12.5 mg BID  - Continue lipitor 80 mg daily  - nitroglycerin SL PRN  - Echocardiogram this admission with EF 55-60%, no regional wall motion abnormalities, grade II DD, normal RV function   Cardio-Onc As it relates  to her FOLFIRI, she can continue therapy and if develops persistent chest pain or medications can consider management for vasospasm as well.  No signs of this on heart cath.  Aortic Stenosis / Possible Bicuspid Aortic Valve  Stable - Previously, echocardiogram in 10/2019 showed a probably bicuspid aortic valve with mild calcification, mean gradient of 13 mmHg - Echocardiogram this admission showed that the aortic valve is visually calcified with decreased stroke volume index and DVI of 0.30. Acceleration time greater than 100 ms. Moderate to severe aortic stenosis with mean gradient 20.0 on echo/ cath showed mild-moderate.  She is asymptomatic.  Will continue surveillance as an outpatient   HTN  - BP overall well controlled   Otherwise per primary  - GERD - Stage IV metastatic colon cancer   Will continue medical management for NSTEMI.  She is stable to be discharged today.  I will plan to see her in follow-up.  For questions or updates, please contact [facility] Please consult [email or web address] for contact info under        Signed, Gable Odonohue, Alben Spittle, MD

## 2023-05-08 NOTE — Discharge Summary (Signed)
 Physician Discharge Summary  CARL BLEECKER YHC:[id number] DOB: 04-10-1955 DOA: 05/05/2023  PCP: Ralene Ok, MD  Admit date: 05/05/2023 Discharge date: 05/08/2023  Admitted From: Home Disposition: Home  Recommendations for Outpatient Follow-up:  Follow up with PCP in 1-2 weeks Please obtain BMP/CBC in one week your next doctors visit.  Dual antiplatelet treatment to be continued, metoprolol and Lipitor ordered.  Follow-up outpatient cardiology for further management Cardizem for now as we uptitrate GDMT  Home Health: None Equipment/Devices: None Discharge Condition: Stable CODE STATUS: Full code Diet recommendation: Heart healthy  Brief/Interim Summary: Brief Narrative:  68 year old with history of stage IV colon cancer with metastatic disease to liver on chemotherapy, GERD, HTN presented from the cancer center for chest pain.  Patient also reporting of heartburn.  Lab work showed rising troponin therefore cardiology team consulted.  Echocardiogram showed preserved EF of 60%.  Left heart catheterization on 2/28 showed severe to small vessel disease not amenable to PCI.  Recommending DAPT, aspirin and Plavix along with GDMT. Cleared for discharge today   Assessment & Plan:  Principal Problem:   NSTEMI (non-ST elevation myocardial infarction) (HCC) Active Problems:   metastatic colon cancer to liver   Hypertension   Acute coronary syndrome/NSTEMI CAD -Chest pain appears to have subsided.  Initially on heparin drip and underwent cardiac catheterization on 2/28 showing severe small vessel disease not amenable to PCI.  Recommending DAPT with aspirin and Plavix along with GDMT.  Management per cardiology team.  Echocardiogram shows preserved EF with grade 2 DD.  LDL 69.  Metastatic colon cancer -Mets to the liver.  Currently on chemotherapy, follows Dr. Mosetta Putt  Essential hypertension - On metoprolol  GERD -PPI      Code Status: Full Code Family Communication:   Status is:  Inpatient Remains inpatient appropriate because: Dc once cleared by cardiology.     Subjective: Feeling well wants to go home   Examination:  General exam: Appears calm and comfortable  Respiratory system: Clear to auscultation. Respiratory effort normal. Cardiovascular system: S1 & S2 heard, RRR. No JVD, murmurs, rubs, gallops or clicks. No pedal edema. Gastrointestinal system: Abdomen is nondistended, soft and nontender. No organomegaly or masses felt. Normal bowel sounds heard. Central nervous system: Alert and oriented. No focal neurological deficits. Extremities: Symmetric 5 x 5 power. Skin: No rashes, lesions or ulcers Psychiatry: Judgement and insight appear normal. Mood & affect appropriate.    Discharge Diagnoses:  Principal Problem:   NSTEMI (non-ST elevation myocardial infarction) [facility]) Active Problems:   metastatic colon cancer to liver   Hypertension      Discharge Exam: Vitals:   05/08/23 0445 05/08/23 0838  BP: 117/73 114/65  Pulse: (!) 59 67  Resp: 16 14  Temp: 98.7 F (37.1 C) 98.4 F (36.9 C)  SpO2: 100% 100%   Vitals:   05/08/23 0028 05/08/23 0445 05/08/23 0500 05/08/23 0838  BP: 104/67 117/73  114/65  Pulse: 60 (!) 59  67  Resp: 18 16  14   Temp: 98.2 F (36.8 C) 98.7 F (37.1 C)  98.4 F (36.9 C)  TempSrc: Oral Oral  Oral  SpO2: 97% 100%  100%  Weight:   84.1 kg   Height:          Discharge Instructions  Discharge Instructions     AMB referral to Phase II Cardiac Rehabilitation   Complete by: As directed    Diagnosis: NSTEMI   After initial evaluation and assessments completed: Virtual Based Care may be provided alone or  in conjunction with Phase 2 Cardiac Rehab based on patient barriers.: Yes   Intensive Cardiac Rehabilitation (ICR) [facility] location only OR Traditional Cardiac Rehabilitation (TCR) *If criteria for ICR are not met will enroll in TCR [facility] only): Yes   Ambulatory referral to Cardiology   Complete by: As directed     If you have not heard from the Cardiology office within the next 72 hours please call [phone].      Allergies as of 05/08/2023       Reactions   Oxaliplatin Nausea Only   Diaphoresis, hypotension, and bradycardia-patient was on 18th dose and had a reaction that sent her to the emergency room        Medication List     STOP taking these medications    Klor-Con M20 20 MEQ tablet Generic drug: potassium chloride SA   verapamil 120 MG CR tablet Commonly known as: CALAN-SR       TAKE these medications    aspirin EC 81 MG tablet Take 1 tablet (81 mg total) by mouth daily. Swallow whole. Start taking on: May 09, 2023   atorvastatin 80 MG tablet Commonly known as: LIPITOR Take 1 tablet (80 mg total) by mouth daily. Start taking on: May 09, 2023   clopidogrel 75 MG tablet Commonly known as: PLAVIX Take 1 tablet (75 mg total) by mouth daily. Start taking on: May 09, 2023   metoprolol tartrate 25 MG tablet Commonly known as: LOPRESSOR Take 0.5 tablets (12.5 mg total) by mouth 2 (two) times daily.   pantoprazole 20 MG tablet Commonly known as: Protonix Take 1 tablet (20 mg total) by mouth daily.        Follow-up Information     Ralene Ok, MD Follow up in 1 week(s).   Specialty: Internal Medicine Contact information: [address] [place] [address] [phone]                Allergies  Allergen Reactions   Oxaliplatin Nausea Only    Diaphoresis, hypotension, and bradycardia-patient was on 18th dose and had a reaction that sent her to the emergency room    You were cared for by a hospitalist during your hospital stay. If you have any questions about your discharge medications or the care you received while you were in the hospital after you are discharged, you can call the unit and asked to speak with the hospitalist on call if the hospitalist that took care of you is not available. Once you are discharged, your primary care physician  will handle any further medical issues. Please note that no refills for any discharge medications will be authorized once you are discharged, as it is imperative that you return to your primary care physician (or establish a relationship with a primary care physician if you do not have one) for your aftercare needs so that they can reassess your need for medications and monitor your lab values.  You were cared for by a hospitalist during your hospital stay. If you have any questions about your discharge medications or the care you received while you were in the hospital after you are discharged, you can call the unit and asked to speak with the hospitalist on call if the hospitalist that took care of you is not available. Once you are discharged, your primary care physician will handle any further medical issues. Please note that NO REFILLS for any discharge medications will be authorized once you are discharged, as it is imperative that you return  to your primary care physician (or establish a relationship with a primary care physician if you do not have one) for your aftercare needs so that they can reassess your need for medications and monitor your lab values.  Please request your Prim.MD to go over all Hospital Tests and Procedure/Radiological results at the follow up, please get all Hospital records sent to your Prim MD by signing hospital release before you go home.  Get CBC, CMP, 2 view Chest X ray checked  by Primary MD during your next visit or SNF MD in 5-7 days ( we routinely change or add medications that can affect your baseline labs and fluid status, therefore we recommend that you get the mentioned basic workup next visit with your PCP, your PCP may decide not to get them or add new tests based on their clinical decision)  On your next visit with your primary care physician please Get Medicines reviewed and adjusted.  If you experience worsening of your admission symptoms, develop shortness  of breath, life threatening emergency, suicidal or homicidal thoughts you must seek medical attention immediately by calling 911 or calling your MD immediately  if symptoms less severe.  You Must read complete instructions/literature along with all the possible adverse reactions/side effects for all the Medicines you take and that have been prescribed to you. Take any new Medicines after you have completely understood and accpet all the possible adverse reactions/side effects.   Do not drive, operate heavy machinery, perform activities at heights, swimming or participation in water activities or provide baby sitting services if your were admitted for syncope or siezures until you have seen by Primary MD or a Neurologist and advised to do so again.  Do not drive when taking Pain medications.   Procedures/Studies: CARDIAC CATHETERIZATION Result Date: 05/07/2023 Images from the original result were not included.   Ramus-1 lesion is 90% stenosed.   Ramus-2 lesion is 80% stenosed.   Prox RCA lesion is 75% stenosed.   Mid RCA lesion is 50% stenosed.   Prox LAD to Mid LAD lesion is 40% stenosed.   LV end diastolic pressure is mildly elevated.   There is (mild-) moderate aortic valve stenosis. Dominance: Right Severe two-vessel disease with diffuse disease small vessels throughout colon likely culprit is 90% followed by 80% lesions and a 1.5 mm high Diag/Ramus, along with a focal 75% stenosis and a very small caliber RCA followed by segmental 50% stenosis. Mild diffuse disease in the proximal LAD -Lesions are not amenable to PCI based on the small caliber vessels. Best suited for medical management. Relative normal LVEDP. RECOMMENDATIONS   Anticipated discharge date to be determined.  She will be returned to [facility] for ongoing care   Recommend uninterrupted dual antiplatelet therapy with Aspirin 81mg  daily and Clopidogrel 75mg  daily for a minimum of 12 months (ACS-Class I recommendation). Would be okay to  simply do Plavix monotherapy after first 3 months given the ACS presentation.   Continue to titrate GDMT for CAD.  [name], MD  ECHOCARDIOGRAM COMPLETE Result Date: 05/06/2023    ECHOCARDIOGRAM REPORT   Patient Name:   KATHLEE BARNHARDT Date of Exam: 05/06/2023 Medical Rec #:  [id number]      Height:       66.0 in Accession #:    [id number]     Weight:       180.8 lb Date of Birth:  January 31, 1956      BSA:  1.916 m Patient Age:    67 years       BP:           120/68 mmHg Patient Gender: F              HR:           64 bpm. Exam Location:  Inpatient Procedure: 2D Echo, Cardiac Doppler and Color Doppler (Both Spectral and Color            Flow Doppler were utilized during procedure). Indications:    CHF Acute Diastolic  History:        Patient has no prior history of Echocardiogram examinations.  Sonographer:    Karma Ganja Referring Phys: [id number] VISHAL R PATEL IMPRESSIONS  1. Left ventricular ejection fraction, by estimation, is 55 to 60%. The left ventricle has normal function. The left ventricle has no regional wall motion abnormalities. Left ventricular diastolic parameters are consistent with Grade II diastolic dysfunction (pseudonormalization).  2. Right ventricular systolic function is normal. The right ventricular size is normal.  3. Left atrial size was mildly dilated.  4. The mitral valve is normal in structure. Trivial mitral valve regurgitation. No evidence of mitral stenosis.  5. Visually calcified with decreased stroke volume index and DVI of 0.30 and acceleration time greater thant 100 ms. The aortic valve is calcified. Aortic valve regurgitation is not visualized. Moderate to severe aortic valve stenosis. Aortic valve area, by VTI measures 0.85 cm. Aortic valve mean gradient measures 20.0 mmHg. Aortic valve Vmax measures 3.07 m/s. Aortic valve acceleration time measures 105 msec. Comparison(s): No prior Echocardiogram. FINDINGS  Left Ventricle: Left ventricular ejection fraction, by  estimation, is 55 to 60%. The left ventricle has normal function. The left ventricle has no regional wall motion abnormalities. Strain imaging was not performed. The left ventricular internal cavity  size was normal in size. There is no left ventricular hypertrophy. Left ventricular diastolic parameters are consistent with Grade II diastolic dysfunction (pseudonormalization). Right Ventricle: The right ventricular size is normal. No increase in right ventricular wall thickness. Right ventricular systolic function is normal. Left Atrium: Left atrial size was mildly dilated. Right Atrium: Right atrial size was normal in size. Pericardium: There is no evidence of pericardial effusion. Mitral Valve: The mitral valve is normal in structure. Trivial mitral valve regurgitation. No evidence of mitral valve stenosis. Tricuspid Valve: The tricuspid valve is normal in structure. Tricuspid valve regurgitation is not demonstrated. Aortic Valve: Visually calcified with decreased stroke volume index and DVI of 0.30 and acceleration time greater thant 100 ms. The aortic valve is calcified. Aortic valve regurgitation is not visualized. Moderate to severe aortic stenosis is present. Aortic valve mean gradient measures 20.0 mmHg. Aortic valve peak gradient measures 37.7 mmHg. Aortic valve area, by VTI measures 0.85 cm. Pulmonic Valve: The pulmonic valve was normal in structure. Pulmonic valve regurgitation is not visualized. No evidence of pulmonic stenosis. Aorta: The aortic root, ascending aorta and aortic arch are all structurally normal, with no evidence of dilitation or obstruction. IAS/Shunts: The atrial septum is grossly normal. Additional Comments: 3D imaging was not performed.  LEFT VENTRICLE PLAX 2D LVIDd:         4.60 cm   Diastology LVIDs:         2.90 cm   LV e' medial:    6.31 cm/s LV PW:         1.00 cm   LV E/e' medial:  17.6 LV IVS:  0.80 cm   LV e' lateral:   6.96 cm/s LVOT diam:     1.90 cm   LV E/e' lateral:  15.9 LV SV:         59 LV SV Index:   31 LVOT Area:     2.84 cm  RIGHT VENTRICLE RV Basal diam:  3.90 cm RV S prime:     9.90 cm/s TAPSE (M-mode): 2.4 cm LEFT ATRIUM             Index        RIGHT ATRIUM           Index LA diam:        4.10 cm 2.14 cm/m   RA Area:     14.80 cm LA Vol (A2C):   73.1 ml 38.16 ml/m  RA Volume:   35.70 ml  18.64 ml/m LA Vol (A4C):   56.4 ml 29.44 ml/m LA Biplane Vol: 65.5 ml 34.19 ml/m  AORTIC VALVE AV Area (Vmax):    0.85 cm AV Area (Vmean):   0.78 cm AV Area (VTI):     0.85 cm AV Vmax:           307.00 cm/s AV Vmean:          209.500 cm/s AV VTI:            0.691 m AV Peak Grad:      37.7 mmHg AV Mean Grad:      20.0 mmHg LVOT Vmax:         91.80 cm/s LVOT Vmean:        57.450 cm/s LVOT VTI:          0.207 m LVOT/AV VTI ratio: 0.30  AORTA Ao Root diam: 3.20 cm MITRAL VALVE MV Area (PHT): 3.08 cm     SHUNTS MV Decel Time: 246 msec     Systemic VTI:  0.21 m MV E velocity: 111.00 cm/s  Systemic Diam: 1.90 cm MV A velocity: 102.00 cm/s MV E/A ratio:  1.09 Riley Lam MD Electronically signed by Riley Lam MD Signature Date/Time: 05/06/2023/2:02:31 PM    Final    DG Chest Port 1 View Result Date: 05/05/2023 CLINICAL DATA:  Metastatic colon cancer, chest pain during chemotherapy EXAM: PORTABLE CHEST 1 VIEW COMPARISON:  03/24/2023, 07/21/2022 FINDINGS: Single frontal view of the chest demonstrates stable right chest wall port. The cardiac silhouette is unremarkable. No acute airspace disease, effusion, or pneumothorax. No acute displaced fractures. IMPRESSION: 1. No acute intrathoracic process. Electronically Signed   By: Sharlet Salina M.D.   On: 05/05/2023 15:28     The results of significant diagnostics from this hospitalization (including imaging, microbiology, ancillary and laboratory) are listed below for reference.     Microbiology: No results found for this or any previous visit (from the past 240 hours).   Labs: BNP (last 3 results) No  results for input(s): "BNP" in the last 8760 hours. Basic Metabolic Panel: Recent Labs  Lab 05/05/23 0844 05/05/23 1407 05/06/23 0343  NA 138 136 133*  K 3.9 3.9 4.0  CL 104 103 102  CO2 28 23 22   GLUCOSE 116* 155* 149*  BUN 13 12 17   CREATININE 0.63 0.62 0.64  CALCIUM 9.8 9.5 9.2   Liver Function Tests: Recent Labs  Lab 05/05/23 0844  AST 14*  ALT 13  ALKPHOS 109  BILITOT 0.3  PROT 7.5  ALBUMIN 4.2   No results for input(s): "LIPASE", "AMYLASE" in the last 168 hours. No  results for input(s): "AMMONIA" in the last 168 hours. CBC: Recent Labs  Lab 05/05/23 0844 05/05/23 1407 05/06/23 0343 05/07/23 0448  WBC 6.8 8.4 9.6 11.1*  NEUTROABS 4.1  --   --   --   HGB 11.8* 11.9* 11.4* 10.8*  HCT 36.8 38.2 35.8* 35.7*  MCV 90.2 92.9 91.3 94.2  PLT 306 302 294 272   Cardiac Enzymes: No results for input(s): "CKTOTAL", "CKMB", "CKMBINDEX", "TROPONINI" in the last 168 hours. BNP: Invalid input(s): "POCBNP" CBG: No results for input(s): "GLUCAP" in the last 168 hours. D-Dimer Recent Labs    05/05/23 1407  DDIMER 0.40   Hgb A1c No results for input(s): "HGBA1C" in the last 72 hours. Lipid Profile Recent Labs    05/07/23 0913  CHOL 142  HDL 55  LDLCALC 69  TRIG 88  CHOLHDL 2.6   Thyroid function studies No results for input(s): "TSH", "T4TOTAL", "T3FREE", "THYROIDAB" in the last 72 hours.  Invalid input(s): "FREET3" Anemia work up No results for input(s): "VITAMINB12", "FOLATE", "FERRITIN", "TIBC", "IRON", "RETICCTPCT" in the last 72 hours. Urinalysis    Component Value Date/Time   PROTEINUR NEGATIVE 05/05/2023 0844   Sepsis Labs Recent Labs  Lab 05/05/23 0844 05/05/23 1407 05/06/23 0343 05/07/23 0448  WBC 6.8 8.4 9.6 11.1*   Microbiology No results found for this or any previous visit (from the past 240 hours).   Time coordinating discharge:  I have spent 35 minutes face to face with the patient and on the ward discussing the patients care,  assessment, plan and disposition with other care givers. >50% of the time was devoted counseling the patient about the risks and benefits of treatment/Discharge disposition and coordinating care.   SIGNED:   Miguel Rota, MD  [facility] 05/08/2023, 11:46 AM   If 7PM-7AM, please contact night-coverage

## 2023-05-10 ENCOUNTER — Telehealth: Payer: Self-pay | Admitting: Hematology

## 2023-05-10 ENCOUNTER — Inpatient Hospital Stay: Payer: Medicare (Managed Care) | Attending: Hematology | Admitting: Hematology

## 2023-05-10 ENCOUNTER — Encounter (HOSPITAL_COMMUNITY): Payer: Self-pay | Admitting: Cardiology

## 2023-05-10 DIAGNOSIS — G620 Drug-induced polyneuropathy: Secondary | ICD-10-CM | POA: Diagnosis not present

## 2023-05-10 DIAGNOSIS — T451X5D Adverse effect of antineoplastic and immunosuppressive drugs, subsequent encounter: Secondary | ICD-10-CM | POA: Insufficient documentation

## 2023-05-10 DIAGNOSIS — Z5189 Encounter for other specified aftercare: Secondary | ICD-10-CM | POA: Diagnosis not present

## 2023-05-10 DIAGNOSIS — C7800 Secondary malignant neoplasm of unspecified lung: Secondary | ICD-10-CM | POA: Diagnosis not present

## 2023-05-10 DIAGNOSIS — C787 Secondary malignant neoplasm of liver and intrahepatic bile duct: Secondary | ICD-10-CM | POA: Diagnosis present

## 2023-05-10 DIAGNOSIS — Z90710 Acquired absence of both cervix and uterus: Secondary | ICD-10-CM | POA: Insufficient documentation

## 2023-05-10 DIAGNOSIS — Z5111 Encounter for antineoplastic chemotherapy: Secondary | ICD-10-CM | POA: Insufficient documentation

## 2023-05-10 DIAGNOSIS — C221 Intrahepatic bile duct carcinoma: Secondary | ICD-10-CM

## 2023-05-10 DIAGNOSIS — Z79899 Other long term (current) drug therapy: Secondary | ICD-10-CM | POA: Insufficient documentation

## 2023-05-10 DIAGNOSIS — C19 Malignant neoplasm of rectosigmoid junction: Secondary | ICD-10-CM | POA: Insufficient documentation

## 2023-05-10 NOTE — Progress Notes (Signed)
 [facility]   Telephone:([phone]) [phone] Fax:([phone]) [phone]   Clinic Follow up Note   Patient Care Team: Ralene Ok, MD as PCP - General (Internal Medicine) Wyline Mood Alben Spittle, MD as PCP - Cardiology (Cardiology) Radonna Ricker, RN (Inactive) as Oncology Nurse Navigator Malachy Mood, MD as Consulting Physician (Oncology) Jeani Hawking, MD as Consulting Physician (Gastroenterology) 05/10/2023  I connected with Lindsay [name] on 05/10/23 at  9:40 AM EST by telephone and verified that I am speaking with the correct person using two identifiers.   I discussed the limitations, risks, security and privacy concerns of performing an evaluation and management service by telephone and the availability of in person appointments. I also discussed with the patient that there may be a patient responsible charge related to this service. The patient expressed understanding and agreed to proceed.   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 Liver biopsy 01/08/20 confirmed metastasis from primary colorectal cancer. PET scan showed hypermetabolism to known liver mets, diffuse thoracic and abdominal lymphadenopathy, a 1.8 cm LUL pulmonary nodule, and splenic flexure of colon. -treated with first line FOLFOX 01/25/20 - 10/02/20, Vectibix added with C2. Oxali discontinued after C18 due to reaction. -switched to Xeloda 10/21/20, dose adjusted due to significant skin toxicity -due to cancer progression, treatment has been changed to FOLFIRI and bevacizumab on 03/25/22, she is overall tolerating well -will repeat CT scan after next cycle chemo  -Restaging CT scan from 06/16/2022  showed stable to mild decrease in size of treated liver metastasis. No new lesions -She is tolerating chemotherapy well overall, will continue -Her restaging CT scan on August 31, 2022 showed stable liver metastasis, and a stable 7 mm left lung nodule, indeterminate.  I personally reviewed the scan images with patient -Will continue current therapy.  We discussed the option of maintenance therapy with irinotecan and bevacizumab in future.  She is tolerating current FOLFIRI and bevacizumab well, will continue. -Restaging CT 03/24/2023 showed stable disease.    Assessment and Plan    Metastatic Colon Cancer She has metastatic colon cancer and is currently undergoing chemotherapy. Due to a recent mild myocardial infarction, reducing chemotherapy to lessen cardiovascular stress is considered. A two-week break from chemotherapy is deemed acceptable. - Hold bevacizumab - Slightly reduce chemotherapy dose - Restart chemotherapy on June 02, 2023  Coronary Artery Disease with Recent Myocardial Infarction She has coronary artery disease and recently experienced a mild myocardial infarction. Chemotherapy stress may have contributed to the episode. She is scheduled to see the cardiologist on May 24, 2023. No surgery is planned at this time. - Allow a break from chemotherapy - Follow up with cardiologist on May 24, 2023  Constipation She reported constipation for about four days post-hospitalization, likely related to chemotherapy or other medications. - Monitor bowel movements - Consider laxatives if constipation persists.     Plan -Due to her recent non-STEMI, we will cancel her chemotherapy next week, and restart chemo on June 02, 2023.  Will cancel her bevacizumab, slightly reduce chemo dose -Follow-up on March 26    SUMMARY OF ONCOLOGIC HISTORY: Oncology History  metastatic colon cancer to liver  06/20/2019 Procedure   Colonoscopy  by Dr Meridee Score 06/20/19  IMPRESSION -Seven 3 to 10 mm  polyps in the sigmoid colon, in the transverse colon and in the escending colon, removed with a cold snare. Resected and retrireved.  -One 20mm polyp in the descending colon. Biopsies. Tattoes.  -Mediaum sized lipoma in the ascending colon.   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 See Comment B. Colon, Ascending, polyp, Polypectomy:  -TUBULAR ADENOMA -No high grade dysplasia or malignancy.  C. Colon, TRansverse, Polyo, polectomy:  -TUBULAR ADENOMA -No high grade dysplasia or malignancy.  D. Colon, Sigmoid, Polyp, Polypectomy:  -HYPERPLASTIC POLYP   11/07/2019 Imaging   US Abdomen 11/07/19  IMPRESSION: 1. Two solid masses in the liver are nonspecific. Recommend MRI abdomen with and without contrast for further evaluation.   12/15/2019 Imaging   MRI Abdomen 12/15/19  IMPRESSION: 1. There are two large masses in the liver with appearance favoring metastatic disease or hepatocellular carcinoma or cholangiocarcinoma. A benign etiology is highly unlikely given the enhancement pattern and associated adenopathy. 2. Considerable porta hepatis and retroperitoneal adenopathy. Some of the confluent porta hepatis tumor is potentially infiltrative and abuts the pancreatic body along its right upper margin, making it difficult to completely exclude the possibility of pancreatic adenocarcinoma primary. Possibilities helpful in further workup might include tissue diagnosis, endoscopic ultrasound, or nuclear medicine PET-CT. 3. Pancreas divisum. 4. Lumbar spondylosis and degenerative disc disease. 5. Despite efforts by the technologist and patient, motion artifact is present on today's exam and could not be eliminated. This reduces exam sensitivity and specificity.   12/22/2019 Procedure   Upper Endoscopy by Dr Elnoria Howard 12/22/19  IMPRESSION - One lymph node was visualized and measured in the porta hepatis region.  Fine needle aspiration performed    12/22/2019 Initial Biopsy   A. LIVER, PORTA HEPATIS MASS, FINE NEEDLE 12/22/19 ASPIRATION:  FINAL MICROSCOPIC DIAGNOSIS:  - Malignant cells consistent with metastatic adenocarcinoma   01/01/2020 Initial Diagnosis   Intrahepatic cholangiocarcinoma (HCC)   01/08/2020 Initial Biopsy   FINAL MICROSCOPIC DIAGNOSIS:   A. LIVER, LEFT LOBE, BIOPSY:  - Metastatic adenocarcinoma, consistent with a colorectal primary.  See  comment      COMMENT:   Immunohistochemical stains show the tumor cells are positive for CK20  and CDX2 but negative for CK7, consistent with above interpretation.  Dr. Mosetta Putt was notified on 01/10/2020   01/08/2020 Genetic Testing   [name]  [name] wildtype and [name]/[name] mutations which make her eligible for target biological agent Vectibix.   01/24/2020 Procedure   PAC placed 01/24/20   01/25/2020 -  Chemotherapy   first line FOLFOX starting 01/25/2020, Vextibix  added with C2 (02/06/20)   02/06/2020 - 02/06/2022 Chemotherapy   Patient is on Treatment Plan : COLORECTAL Panitumumab q14d ([name] Wild - Type Gene Only)     06/20/2020 Imaging   CT CAP  IMPRESSION: 1. Continued interval reduction in size and conspicuity of a subsolid nodule of the peripheral left upper lobe. 2. Unchanged prominent pretracheal and subcarinal lymph nodes. 3. Redemonstrated partially calcified low-attenuation liver masses, slightly decreased in size compared to prior examination. 4. Slight interval decrease in size of a portacaval lymph node or conglomerate and retroperitoneal lymph nodes. 5. Findings are consistent with continued treatment response of nodal, pulmonary, and hepatic metastatic disease. 6. Coronary artery disease.   Aortic Atherosclerosis (ICD10-I70.0).     10/09/2020 Imaging   IMPRESSION: 1. Slight decrease in size of dominant liver mass with smaller  liver mass within 1-2 mm of prior measurement. 2. Stable appearance of  celiac lymph and retroperitoneal lymph nodes. Dominant node with calcification in the gastrohepatic ligament as described. 3. Continued decrease in size of LEFT upper lobe nodule. 4. Three-vessel coronary artery calcification. 5. Aortic atherosclerosis.   03/25/2022 -  Chemotherapy   Patient is on Treatment Plan : COLORECTAL FOLFIRI + Bevacizumab q14d     08/31/2022 Imaging    IMPRESSION: 1. Unchanged, densely calcified liver lesions, consistent with treated metastatic disease. 2. Unchanged, irregular subpleural opacity of the peripheral left upper lobe. 3. Unchanged subcentimeter gastrohepatic ligament lymph node. 4. No evidence of new metastatic disease in the chest, abdomen, or pelvis. 5. Status post hysterectomy. 6. Coronary artery disease. 7. Aortic valve calcifications. Correlate for echocardiographic evidence of aortic valve dysfunction.       Discussed the use of AI scribe software for clinical note transcription with the patient, who gave verbal consent to proceed.  History of Present Illness   The patient, a 68 year old with metastatic colon cancer on chemotherapy, was recently admitted to the hospital for chest pain. Since discharge, she reports no further chest pain and has been doing well overall. However, she experienced constipation for four days after returning home.  During her hospital stay, she was seen by a cardiologist due to the chest pain. The cardiologist informed her that she has blockage in her heart blood vessels and had a mild heart attack. She was started on medication for this, as her vessels were reportedly "closed up." She is scheduled for further testing with the cardiologist to evaluate her condition.  The patient's chemotherapy treatment for metastatic colon cancer has been ongoing. However, she has been advised that the chemotherapy may be causing additional stress on her body, potentially triggering her recent chest pain episode.          REVIEW OF SYSTEMS:   Constitutional: Denies fevers, chills or abnormal weight loss Eyes: Denies blurriness of vision Ears, nose, mouth, throat, and face: Denies mucositis or sore throat Respiratory: Denies cough, dyspnea or wheezes Cardiovascular: Denies palpitation, chest discomfort or lower extremity swelling Gastrointestinal:  Denies nausea, heartburn or change in bowel habits Skin: Denies abnormal skin rashes Lymphatics: Denies new lymphadenopathy or easy bruising Neurological:Denies numbness, tingling or new weaknesses Behavioral/Psych: Mood is stable, no new changes  All other systems were reviewed with the patient and are negative.  MEDICAL HISTORY:  Past Medical History:  Diagnosis Date   Arthritis    Diabetes mellitus without complication (HCC)    Hypertension    met colon ca to liver 01/2020    SURGICAL HISTORY: Past Surgical History:  Procedure Laterality Date   ABDOMINAL HYSTERECTOMY     ANTERIOR AND POSTERIOR REPAIR WITH SACROSPINOUS FIXATION N/A 07/16/2016   Procedure: ANTERIOR AND POSTERIOR REPAIR WITH SACROSPINOUS FIXATION;  Surgeon: Harold Hedge, MD;  Location: [facility];  Service: Gynecology;  Laterality: N/A;   CYSTOSCOPY  07/16/2016   Procedure: CYSTOSCOPY;  Surgeon: Harold Hedge, MD;  Location: [facility];  Service: Gynecology;;   ESOPHAGOGASTRODUODENOSCOPY (EGD) WITH PROPOFOL N/A 12/22/2019   Procedure: ESOPHAGOGASTRODUODENOSCOPY (EGD) WITH PROPOFOL;  Surgeon: Jeani Hawking, MD;  Location: [facility];  Service: Endoscopy;  Laterality: N/A;   FINE NEEDLE ASPIRATION N/A 12/22/2019   Procedure: FINE NEEDLE ASPIRATION (FNA) LINEAR;  Surgeon: Jeani Hawking, MD;  Location: [facility];  Service: Endoscopy;  Laterality: N/A;   IR IMAGING GUIDED PORT INSERTION  01/24/2020   LAPAROSCOPIC VAGINAL HYSTERECTOMY WITH SALPINGECTOMY Bilateral 07/16/2016  Procedure: LAPAROSCOPIC ASSISTED VAGINAL HYSTERECTOMY WITH SALPINGECTOMY;  Surgeon: Harold Hedge, MD;   Location: [facility];  Service: Gynecology;  Laterality: Bilateral;   LEFT HEART CATH AND CORONARY ANGIOGRAPHY N/A 05/07/2023   Procedure: LEFT HEART CATH AND CORONARY ANGIOGRAPHY;  Surgeon: Marykay Lex, MD;  Location: [facility];  Service: Cardiovascular;  Laterality: N/A;   MYOMECTOMY ABDOMINAL APPROACH     THYROID SURGERY     tyroid     UPPER ESOPHAGEAL ENDOSCOPIC ULTRASOUND (EUS) N/A 12/22/2019   Procedure: UPPER ESOPHAGEAL ENDOSCOPIC ULTRASOUND (EUS);  Surgeon: Jeani Hawking, MD;  Location: [facility];  Service: Endoscopy;  Laterality: N/A;    I have reviewed the social history and family history with the patient and they are unchanged from previous note.  ALLERGIES:  is allergic to oxaliplatin.  MEDICATIONS:  Current Outpatient Medications  Medication Sig Dispense Refill   aspirin EC 81 MG tablet Take 1 tablet (81 mg total) by mouth daily. Swallow whole. 90 tablet 0   atorvastatin (LIPITOR) 80 MG tablet Take 1 tablet (80 mg total) by mouth daily. 90 tablet 0   clopidogrel (PLAVIX) 75 MG tablet Take 1 tablet (75 mg total) by mouth daily. 90 tablet 0   metoprolol tartrate (LOPRESSOR) 25 MG tablet Take 0.5 tablets (12.5 mg total) by mouth 2 (two) times daily. 30 tablet 0   pantoprazole (PROTONIX) 20 MG tablet Take 1 tablet (20 mg total) by mouth daily. 30 tablet 1   No current facility-administered medications for this visit.    PHYSICAL EXAMINATION: Not performed   LABORATORY DATA:  I have reviewed the data as listed    Latest Ref Rng & Units 05/07/2023    4:48 AM 05/06/2023    3:43 AM 05/05/2023    2:07 PM  CBC  WBC 4.0 - 10.5 K/uL 11.1  9.6  8.4   Hemoglobin 12.0 - 15.0 g/dL 91.4  78.2  95.6   Hematocrit 36.0 - 46.0 % 35.7  35.8  38.2   Platelets 150 - 400 K/uL 272  294  302         Latest Ref Rng & Units 05/06/2023    3:43 AM 05/05/2023    2:07 PM 05/05/2023    8:44 AM  CMP  Glucose 70 - 99 mg/dL 213  086  578   BUN 8 - 23 mg/dL 17  12  13    Creatinine  0.44 - 1.00 mg/dL 4.69  6.29  5.28   Sodium 135 - 145 mmol/L 133  136  138   Potassium 3.5 - 5.1 mmol/L 4.0  3.9  3.9   Chloride 98 - 111 mmol/L 102  103  104   CO2 22 - 32 mmol/L 22  23  28    Calcium 8.9 - 10.3 mg/dL 9.2  9.5  9.8   Total Protein 6.5 - 8.1 g/dL   7.5   Total Bilirubin 0.0 - 1.2 mg/dL   0.3   Alkaline Phos 38 - 126 U/L   109   AST 15 - 41 U/L   14   ALT 0 - 44 U/L   13       RADIOGRAPHIC STUDIES: I have personally reviewed the radiological images as listed and agreed with the findings in the report. No results found.     I discussed the assessment and treatment plan with the patient. The patient was provided an opportunity to ask questions and all were answered. The patient agreed with the plan and demonstrated an  understanding of the instructions.   The patient was advised to call back or seek an in-person evaluation if the symptoms worsen or if the condition fails to improve as anticipated.  I provided 15 minutes of non face-to-face telephone visit time during this encounter, and > 50% was spent counseling as documented under my assessment & plan.     Malachy Mood, MD 05/10/23

## 2023-05-10 NOTE — Assessment & Plan Note (Signed)
 MSS, [name]/[name] wildtype -diagnosed 12/2019 by porta hepatis LN biopsy during EUS for work up of abdominal pain and liver lesions seen on [place] and MRI. Liver biopsy 01/08/20 confirmed metastasis from primary colorectal cancer. PET scan showed hypermetabolism to known liver mets, diffuse thoracic and abdominal lymphadenopathy, a 1.8 cm LUL pulmonary nodule, and splenic flexure of colon. -treated with first line FOLFOX 01/25/20 - 10/02/20, Vectibix added with C2. Oxali discontinued after C18 due to reaction. -switched to Xeloda 10/21/20, dose adjusted due to significant skin toxicity -due to cancer progression, treatment has been changed to FOLFIRI and bevacizumab on 03/25/22, she is overall tolerating well -will repeat CT scan after next cycle chemo  -Restaging CT scan from 06/16/2022 showed stable to mild decrease in size of treated liver metastasis. No new lesions -She is tolerating chemotherapy well overall, will continue -Her restaging CT scan on August 31, 2022 showed stable liver metastasis, and a stable 7 mm left lung nodule, indeterminate.  I personally reviewed the scan images with patient -Will continue current therapy.  We discussed the option of maintenance therapy with irinotecan and bevacizumab in future.  She is tolerating current FOLFIRI and bevacizumab well, will continue. -Restaging CT 03/24/2023 showed stable disease.

## 2023-05-10 NOTE — Telephone Encounter (Signed)
 Left patient a voicemail in regards to scheduled appointment per provider request, left callback number if patient is needing to cancel or reschedule appointment

## 2023-05-11 LAB — LIPOPROTEIN A (LPA): Lipoprotein (a): 210.7 nmol/L — ABNORMAL HIGH (ref <75.0)

## 2023-05-19 ENCOUNTER — Ambulatory Visit: Payer: Medicare (Managed Care)

## 2023-05-19 ENCOUNTER — Other Ambulatory Visit: Payer: Self-pay

## 2023-05-19 ENCOUNTER — Other Ambulatory Visit: Payer: Medicare (Managed Care)

## 2023-05-19 ENCOUNTER — Ambulatory Visit: Payer: Medicare (Managed Care) | Admitting: Hematology

## 2023-05-24 ENCOUNTER — Ambulatory Visit: Payer: Medicare (Managed Care) | Attending: Nurse Practitioner | Admitting: Nurse Practitioner

## 2023-05-24 ENCOUNTER — Encounter: Payer: Self-pay | Admitting: Nurse Practitioner

## 2023-05-24 VITALS — BP 124/82 | HR 62 | Ht 66.0 in | Wt 177.2 lb

## 2023-05-24 DIAGNOSIS — E119 Type 2 diabetes mellitus without complications: Secondary | ICD-10-CM

## 2023-05-24 DIAGNOSIS — E785 Hyperlipidemia, unspecified: Secondary | ICD-10-CM

## 2023-05-24 DIAGNOSIS — I350 Nonrheumatic aortic (valve) stenosis: Secondary | ICD-10-CM | POA: Diagnosis not present

## 2023-05-24 DIAGNOSIS — I2510 Atherosclerotic heart disease of native coronary artery without angina pectoris: Secondary | ICD-10-CM | POA: Diagnosis not present

## 2023-05-24 DIAGNOSIS — I10 Essential (primary) hypertension: Secondary | ICD-10-CM | POA: Diagnosis not present

## 2023-05-24 DIAGNOSIS — C221 Intrahepatic bile duct carcinoma: Secondary | ICD-10-CM

## 2023-05-24 NOTE — Progress Notes (Unsigned)
 Office Visit    Patient Name: Lindsay [name] Date of Encounter: 05/24/2023  Primary Care Provider:  Ralene Ok, MD Primary Cardiologist:  Maisie Fus, MD  Chief Complaint    68 year old female with a history of CAD, aortic stenosis, hypertension, hyperlipidemia, type 2 diabetes, former tobacco use, GERD and metastatic colon cancer who presents for hospital follow-up related to CAD.  Past Medical History    Past Medical History:  Diagnosis Date   Arthritis    Diabetes mellitus without complication (HCC)    Hypertension    met colon ca to liver 01/2020   Past Surgical History:  Procedure Laterality Date   ABDOMINAL HYSTERECTOMY     ANTERIOR AND POSTERIOR REPAIR WITH SACROSPINOUS FIXATION N/A 07/16/2016   Procedure: ANTERIOR AND POSTERIOR REPAIR WITH SACROSPINOUS FIXATION;  Surgeon: Harold Hedge, MD;  Location: [facility];  Service: Gynecology;  Laterality: N/A;   CYSTOSCOPY  07/16/2016   Procedure: CYSTOSCOPY;  Surgeon: Harold Hedge, MD;  Location: [facility];  Service: Gynecology;;   ESOPHAGOGASTRODUODENOSCOPY (EGD) WITH PROPOFOL N/A 12/22/2019   Procedure: ESOPHAGOGASTRODUODENOSCOPY (EGD) WITH PROPOFOL;  Surgeon: Jeani Hawking, MD;  Location: [facility];  Service: Endoscopy;  Laterality: N/A;   FINE NEEDLE ASPIRATION N/A 12/22/2019   Procedure: FINE NEEDLE ASPIRATION (FNA) LINEAR;  Surgeon: Jeani Hawking, MD;  Location: [facility];  Service: Endoscopy;  Laterality: N/A;   IR IMAGING GUIDED PORT INSERTION  01/24/2020   LAPAROSCOPIC VAGINAL HYSTERECTOMY WITH SALPINGECTOMY Bilateral 07/16/2016   Procedure: LAPAROSCOPIC ASSISTED VAGINAL HYSTERECTOMY WITH SALPINGECTOMY;  Surgeon: Harold Hedge, MD;  Location: [facility];  Service: Gynecology;  Laterality: Bilateral;   LEFT HEART CATH AND CORONARY ANGIOGRAPHY N/A 05/07/2023   Procedure: LEFT HEART CATH AND CORONARY ANGIOGRAPHY;  Surgeon: Marykay Lex, MD;  Location: [facility] IN[facility]ASIVE CV [facility];  Service: Cardiovascular;  Laterality: N/A;    MYOMECTOMY ABDOMINAL APPROACH     THYROID SURGERY     tyroid     UPPER ESOPHAGEAL ENDOSCOPIC ULTRASOUND (EUS) N/A 12/22/2019   Procedure: UPPER ESOPHAGEAL ENDOSCOPIC ULTRASOUND (EUS);  Surgeon: Jeani Hawking, MD;  Location: [facility];  Service: Endoscopy;  Laterality: N/A;    Allergies  Allergies  Allergen Reactions   Oxaliplatin Nausea Only    Diaphoresis, hypotension, and bradycardia-patient was on 18th dose and had a reaction that sent her to the emergency room     Labs/Other Studies Reviewed    The following studies were reviewed today:  Cardiac Studies & Procedures   ______________________________________________________________________________________________ CARDIAC CATHETERIZATION  CARDIAC CATHETERIZATION 05/07/2023  Narrative Images from the original result were not included.    Ramus-1 lesion is 90% stenosed.   Ramus-2 lesion is 80% stenosed.   Prox RCA lesion is 75% stenosed.   Mid RCA lesion is 50% stenosed.   Prox LAD to Mid LAD lesion is 40% stenosed.   LV end diastolic pressure is mildly elevated.   There is (mild-) moderate aortic valve stenosis.  Dominance: Right  Severe two-vessel disease with diffuse disease small vessels throughout colon likely culprit is 90% followed by 80% lesions and a 1.5 mm high Diag/Ramus, along with a focal 75% stenosis and a very small caliber RCA followed by segmental 50% stenosis. Mild diffuse disease in the proximal LAD -Lesions are not amenable to PCI based on the small caliber vessels. Best suited for medical management.  Relative normal LVEDP.   RECOMMENDATIONS   Anticipated discharge date to be determined.  She will be returned to [facility] for ongoing care   Recommend uninterrupted  dual antiplatelet therapy with Aspirin 81mg  daily and Clopidogrel 75mg  daily for a minimum of 12 months (ACS-Class I recommendation). Would be okay to simply do Plavix monotherapy after first 3 months given the ACS presentation.   Continue to titrate GDMT for CAD.   [name], MD  Findings Coronary Findings Diagnostic  Dominance: Right  Left Main Vessel was injected. Vessel is small.  Left Anterior Descending There is mild diffuse disease throughout the vessel. Prox LAD to Mid LAD lesion is 40% stenosed.  Second Diagonal Branch Vessel is small in size.  Ramus Intermedius Ramus-1 lesion is 90% stenosed. The lesion is focal. The lesion is moderately calcified. 1.5 mm vessel Ramus-2 lesion is 80% stenosed.  Left Circumflex There is mild diffuse disease throughout the vessel.  First Obtuse Marginal Branch Vessel is small in size.  Second Obtuse Marginal Branch There is mild disease in the vessel.  First Left Posterolateral Branch Vessel is moderate in size.  Right Coronary Artery Vessel was injected. Vessel is small. There is moderate diffuse disease throughout the vessel. Prox RCA lesion is 75% stenosed. Mid RCA lesion is 50% stenosed.  Acute Marginal Branch Vessel is moderate in size.  Right Ventricular Branch Vessel is small in size.  Right Posterior Descending Artery Vessel is small in size.  First Right Posterolateral Branch Vessel is small in size.  Intervention  No interventions have been documented.   STRESS TESTS  PCV MYOCARDIAL PERFUSION WO LEXISCAN 11/01/2019  Narrative Lexiscan/modified Bruce Tetrofosmin stress test 11/01/2019: Lexiscan/modified Bruce nuclear stress test performed using 1-day protocol. Stress EKG is non-diagnostic, as this is pharmacological stress test. Stress EKG is non-diagnostic, as this is pharmacological stress test. In addition, stress EKG at 78% MPHR showed sinus tachycardia, <1 mm upsloping ST depression in leads II, III, V5, V6-equivocal for ischemia. Normal stress myocardial perfusion. STress LVEF 50%. Low risk study.   ECHOCARDIOGRAM  ECHOCARDIOGRAM COMPLETE 05/06/2023  Narrative ECHOCARDIOGRAM REPORT    Patient Name:   Lindsay [name] Date of Exam: 05/06/2023 Medical Rec #:  [id number]      Height:       66.0 in Accession #:    [id number]     Weight:       180.8 lb Date of Birth:  07/04/1955      BSA:          1.916 m Patient Age:    67 years       BP:           120/68 mmHg Patient Gender: F              HR:           64 bpm. Exam Location:  Inpatient  Procedure: 2D Echo, Cardiac Doppler and Color Doppler (Both Spectral and Color Flow Doppler were utilized during procedure).  Indications:    CHF Acute Diastolic  History:        Patient has no prior history of Echocardiogram examinations.  Sonographer:    Karma Ganja Referring Phys: [id number] VISHAL R PATEL  IMPRESSIONS   1. Left ventricular ejection fraction, by estimation, is 55 to 60%. The left ventricle has normal function. The left ventricle has no regional wall motion abnormalities. Left ventricular diastolic parameters are consistent with Grade II diastolic dysfunction (pseudonormalization). 2. Right ventricular systolic function is normal. The right ventricular size is normal. 3. Left atrial size was mildly dilated. 4. The mitral valve is normal in structure. Trivial mitral valve regurgitation. No evidence of  mitral stenosis. 5. Visually calcified with decreased stroke volume index and DVI of 0.30 and acceleration time greater thant 100 ms. The aortic valve is calcified. Aortic valve regurgitation is not visualized. Moderate to severe aortic valve stenosis. Aortic valve area, by VTI measures 0.85 cm. Aortic valve mean gradient measures 20.0 mmHg. Aortic valve Vmax measures 3.07 m/s. Aortic valve acceleration time measures 105 msec.  Comparison(s): No prior Echocardiogram.  FINDINGS Left Ventricle: Left ventricular ejection fraction, by estimation, is 55 to 60%. The left ventricle has normal function. The left ventricle has no regional wall motion abnormalities. Strain imaging was not performed. The left ventricular internal cavity size was normal in  size. There is no left ventricular hypertrophy. Left ventricular diastolic parameters are consistent with Grade II diastolic dysfunction (pseudonormalization).  Right Ventricle: The right ventricular size is normal. No increase in right ventricular wall thickness. Right ventricular systolic function is normal.  Left Atrium: Left atrial size was mildly dilated.  Right Atrium: Right atrial size was normal in size.  Pericardium: There is no evidence of pericardial effusion.  Mitral Valve: The mitral valve is normal in structure. Trivial mitral valve regurgitation. No evidence of mitral valve stenosis.  Tricuspid Valve: The tricuspid valve is normal in structure. Tricuspid valve regurgitation is not demonstrated.  Aortic Valve: Visually calcified with decreased stroke volume index and DVI of 0.30 and acceleration time greater thant 100 ms. The aortic valve is calcified. Aortic valve regurgitation is not visualized. Moderate to severe aortic stenosis is present. Aortic valve mean gradient measures 20.0 mmHg. Aortic valve peak gradient measures 37.7 mmHg. Aortic valve area, by VTI measures 0.85 cm.  Pulmonic Valve: The pulmonic valve was normal in structure. Pulmonic valve regurgitation is not visualized. No evidence of pulmonic stenosis.  Aorta: The aortic root, ascending aorta and aortic arch are all structurally normal, with no evidence of dilitation or obstruction.  IAS/Shunts: The atrial septum is grossly normal.  Additional Comments: 3D imaging was not performed.   LEFT VENTRICLE PLAX 2D LVIDd:         4.60 cm   Diastology LVIDs:         2.90 cm   LV e' medial:    6.31 cm/s LV PW:         1.00 cm   LV E/e' medial:  17.6 LV IVS:        0.80 cm   LV e' lateral:   6.96 cm/s LVOT diam:     1.90 cm   LV E/e' lateral: 15.9 LV SV:         59 LV SV Index:   31 LVOT Area:     2.84 cm   RIGHT VENTRICLE RV Basal diam:  3.90 cm RV S prime:     9.90 cm/s TAPSE (M-mode): 2.4 cm  LEFT  ATRIUM             Index        RIGHT ATRIUM           Index LA diam:        4.10 cm 2.14 cm/m   RA Area:     14.80 cm LA Vol (A2C):   73.1 ml 38.16 ml/m  RA Volume:   35.70 ml  18.64 ml/m LA Vol (A4C):   56.4 ml 29.44 ml/m LA Biplane Vol: 65.5 ml 34.19 ml/m AORTIC VALVE AV Area (Vmax):    0.85 cm AV Area (Vmean):   0.78 cm AV Area (VTI):  0.85 cm AV Vmax:           307.00 cm/s AV Vmean:          209.500 cm/s AV VTI:            0.691 m AV Peak Grad:      37.7 mmHg AV Mean Grad:      20.0 mmHg LVOT Vmax:         91.80 cm/s LVOT Vmean:        57.450 cm/s LVOT VTI:          0.207 m LVOT/AV VTI ratio: 0.30  AORTA Ao Root diam: 3.20 cm  MITRAL VALVE MV Area (PHT): 3.08 cm     SHUNTS MV Decel Time: 246 msec     Systemic VTI:  0.21 m MV E velocity: 111.00 cm/s  Systemic Diam: 1.90 cm MV A velocity: 102.00 cm/s MV E/A ratio:  1.09  Riley Lam MD Electronically signed by Riley Lam MD Signature Date/Time: 05/06/2023/2:02:31 PM    Final          ______________________________________________________________________________________________     Recent Labs: 11/25/2022: Magnesium 1.8 05/05/2023: ALT 13 05/06/2023: BUN 17; Creatinine, Ser 0.64; Potassium 4.0; Sodium 133 05/07/2023: Hemoglobin 10.8; Platelets 272  Recent Lipid Panel    Component Value Date/Time   CHOL 142 05/07/2023 0913   TRIG 88 05/07/2023 0913   HDL 55 05/07/2023 0913   CHOLHDL 2.6 05/07/2023 0913   VLDL 18 05/07/2023 0913   LDLCALC 69 05/07/2023 0913    History of Present Illness    68 year old female with the above past medical history including CAD, aortic stenosis, hypertension, hyperlipidemia, type 2 diabetes, former tobacco use, GERD and metastatic colon cancer.  She was previously evaluated by Dr. Jacinto Halim in 2021.  Stress test at the time showed normal myocardial perfusion.  Echocardiogram showed mild LVH, LVEF 62%, G2 DD, elevated atrial pressure, possible bicuspid  aortic valve, with mild aortic valve calcification, mean gradient 50 mmHg.  Carotid ultrasound showed minimal stenosis or plaque bilaterally.  She was hospitalized in February 2025 in the setting of NSTEMI.  Cardiology was consulted.  Echocardiogram showed EF 55 to 60%, normal LV function, no RWMA, G2 DD, moderate to severe aortic stenosis, mean gradient 20 mmHg. She underwent cardiac catheterization which revealed severe two-vessel disease with diffuse disease in the small vessels throughout with 90% ramus 1, 80% ramus 2, 75% proximal RCA, 50% mid RCA, and 40% proximal mid LAD stenoses, not amenable to PCI.  Medical management was advised.  She was discharged home in stable condition on 05/08/2023.  She presents today for follow-up.  Since her recent hospitalization she has been stable overall from a cardiac standpoint.   Chemotherapy for treatment of colon cancer with metastases to liver.  Fasting lipids, LFTs in 6 to 8 weeks.  Notes some fatigue with walking long distances, denies any specific chest pain.  She continues to note some symptoms of indigestion, improved with Gas-X.  No recent A1c on file, patient denies history of diabetes.  Advised her to research a cardiologist to transition to when Dr. Teodoro Kil.  Follow-up in 3 to 4 months.   CAD: Cardiac catheterization which revealed severe two-vessel disease with diffuse disease in the small vessels throughout with 90% ramus 1, 80% ramus 2, 75% proximal RCA, 50% mid RCA, and 40% proximal mid LAD stenoses, not amenable to PCI.  Medical management was advised. Aortic stenosis: History of possible bicuspid aortic valve. Echocardiogram in 04/2023 showed EF  55 to 60%, normal LV function, no RWMA, G2 DD, moderate to severe aortic stenosis, mean gradient 20 mmHg.  Hypertension: BP well controlled. Continue current antihypertensive regimen.  Hyperlipidemia: LDL was 69 in 04/2023.  Liptior recently increased to 80 mg daily.  Will repeat fasting lipids, LFTs  in 6-8 weeks.  Type 2 diabetes: No recent A1c on file.  Patient denies history of diabetes. Metastatic colon cancer: Following with oncology.  Disposition: Follow-up in 3-4 months.   Home Medications    Current Outpatient Medications  Medication Sig Dispense Refill   aspirin EC 81 MG tablet Take 1 tablet (81 mg total) by mouth daily. Swallow whole. 90 tablet 0   atorvastatin (LIPITOR) 80 MG tablet Take 1 tablet (80 mg total) by mouth daily. 90 tablet 0   clopidogrel (PLAVIX) 75 MG tablet Take 1 tablet (75 mg total) by mouth daily. 90 tablet 0   metoprolol tartrate (LOPRESSOR) 25 MG tablet Take 0.5 tablets (12.5 mg total) by mouth 2 (two) times daily. 30 tablet 0   pantoprazole (PROTONIX) 20 MG tablet Take 1 tablet (20 mg total) by mouth daily. 30 tablet 1   No current facility-administered medications for this visit.     Review of Systems    ***.  All other systems reviewed and are otherwise negative except as noted above.    Physical Exam    VS:  BP 124/82 (BP Location: Left Arm, Patient Position: Sitting, Cuff Size: Normal)   Pulse 62   Ht 5\' 6"  (1.676 m)   Wt 177 lb 3.2 oz (80.4 kg)   SpO2 96%   BMI 28.60 kg/m  GEN: Well nourished, well developed, in no acute distress. HEENT: normal. Neck: Supple, no JVD, carotid bruits, or masses. Cardiac: RRR, no murmurs, rubs, or gallops. No clubbing, cyanosis, edema.  Radials/DP/PT 2+ and equal bilaterally. R radial cath site with out bruising, bleeding, bruit, or hematoma.  Respiratory:  Respirations regular and unlabored, clear to auscultation bilaterally. GI: Soft, nontender, nondistended, BS + x 4. MS: no deformity or atrophy. Skin: warm and dry, no rash. Neuro:  Strength and sensation are intact. Psych: Normal affect.  Accessory Clinical Findings    ECG personally reviewed by me today - EKG Interpretation Date/Time:  Monday May 24 2023 13:45:44 EDT Ventricular Rate:  55 PR Interval:  172 QRS Duration:  66 QT  Interval:  410 QTC Calculation: 392 R Axis:   44  Text Interpretation: Sinus bradycardia with sinus arrhythmia Artifact When compared with ECG of 05-May-2023 13:21, PREVIOUS ECG IS PRESENT Confirmed by Bernadene Person ([id number]) on 05/24/2023 1:47:56 PM  - no acute changes.   Lab Results  Component Value Date   WBC 11.1 (H) 05/07/2023   HGB 10.8 (L) 05/07/2023   HCT 35.7 (L) 05/07/2023   MCV 94.2 05/07/2023   PLT 272 05/07/2023   Lab Results  Component Value Date   CREATININE 0.64 05/06/2023   BUN 17 05/06/2023   NA 133 (L) 05/06/2023   K 4.0 05/06/2023   CL 102 05/06/2023   CO2 22 05/06/2023   Lab Results  Component Value Date   ALT 13 05/05/2023   AST 14 (L) 05/05/2023   ALKPHOS 109 05/05/2023   BILITOT 0.3 05/05/2023   Lab Results  Component Value Date   CHOL 142 05/07/2023   HDL 55 05/07/2023   LDLCALC 69 05/07/2023   TRIG 88 05/07/2023   CHOLHDL 2.6 05/07/2023    No results found for: "HGBA1C"  Assessment & Plan  1.  ***      Joylene Grapes, NP 05/24/2023, 1:48 PM

## 2023-05-24 NOTE — Patient Instructions (Signed)
 Medication Instructions:  Your physician recommends that you continue on your current medications as directed. Please refer to the Current Medication list given to you today.  *If you need a refill on your cardiac medications before your next appointment, please call your pharmacy*   Lab Work: Fasting Lipid panel in 6-8 weeks  Testing/Procedures: NONE ordered at this time of appointment   Follow-Up: At [facility], you and your health needs are our priority.  As part of our continuing mission to provide you with exceptional heart care, we have created designated Provider Care Teams.  These Care Teams include your primary Cardiologist (physician) and Advanced Practice Providers (APPs -  Physician Assistants and Nurse Practitioners) who all work together to provide you with the care you need, when you need it.  We recommend signing up for the patient portal called "MyChart".  Sign up information is provided on this After Visit Summary.  MyChart is used to connect with patients for Virtual Visits (Telemedicine).  Patients are able to view lab/test results, encounter notes, upcoming appointments, etc.  Non-urgent messages can be sent to your provider as well.   To learn more about what you can do with MyChart, go to [email or web address].    Your next appointment:    3-4 months   Provider:   Maisie Fus, MD  or Bernadene Person, NP        Other Instructions Heart-Healthy Eating Plan Eating a healthy diet is important for the health of your heart. A heart-healthy eating plan includes: Eating less unhealthy fats. Eating more healthy fats. Eating less salt in your food. Salt is also called sodium. Making other changes in your diet. Talk with your doctor or a diet specialist (dietitian) to create an eating plan that is right for you. What are tips for following this plan? Cooking Avoid frying your food. Try to bake, boil, grill, or broil it instead. You can also reduce fat  by: Removing the skin from poultry. Removing all visible fats from meats. Steaming vegetables in water or broth. Meal planning  At meals, divide your plate into four equal parts: Fill one-half of your plate with vegetables and green salads. Fill one-fourth of your plate with whole grains. Fill one-fourth of your plate with lean protein foods. Eat 2-4 cups of vegetables per day. One cup of vegetables is: 1 cup (91 g) broccoli or cauliflower florets. 2 medium carrots. 1 large bell pepper. 1 large sweet potato. 1 large tomato. 1 medium white potato. 2 cups (150 g) raw leafy greens. Eat 1-2 cups of fruit per day. One cup of fruit is: 1 small apple 1 large banana 1 cup (237 g) mixed fruit, 1 large orange,  cup (82 g) dried fruit, 1 cup (240 mL) 100% fruit juice. Eat more foods that have soluble fiber. These are apples, broccoli, carrots, beans, peas, and barley. Try to get 20-30 g of fiber per day. Eat 4-5 servings of nuts, legumes, and seeds per week: 1 serving of dried beans or legumes equals  cup (90 g) cooked. 1 serving of nuts is  oz (12 almonds, 24 pistachios, or 7 walnut halves). 1 serving of seeds equals  oz (8 g). General information Eat more home-cooked food. Eat less restaurant, buffet, and fast food. Limit or avoid alcohol. Limit foods that are high in starch and sugar. Avoid fried foods. Lose weight if you are overweight. Keep track of how much salt (sodium) you eat. This is important if you have high blood  pressure. Ask your doctor to tell you more about this. Try to add vegetarian meals each week. Fats Choose healthy fats. These include olive oil and canola oil, flaxseeds, walnuts, almonds, and seeds. Eat more omega-3 fats. These include salmon, mackerel, sardines, tuna, flaxseed oil, and ground flaxseeds. Try to eat fish at least 2 times each week. Check food labels. Avoid foods with trans fats or high amounts of saturated fat. Limit saturated fats. These  are often found in animal products, such as meats, butter, and cream. These are also found in plant foods, such as palm oil, palm kernel oil, and coconut oil. Avoid foods with partially hydrogenated oils in them. These have trans fats. Examples are stick margarine, some tub margarines, cookies, crackers, and other baked goods. What foods should I eat? Fruits All fresh, canned (in natural juice), or frozen fruits. Vegetables Fresh or frozen vegetables (raw, steamed, roasted, or grilled). Green salads. Grains Most grains. Choose whole wheat and whole grains most of the time. Rice and pasta, including brown rice and pastas made with whole wheat. Meats and other proteins Lean, well-trimmed beef, veal, pork, and lamb. Chicken and [place] without skin. All fish and shellfish. Wild duck, rabbit, pheasant, and venison. Egg whites or low-cholesterol egg substitutes. Dried beans, peas, lentils, and tofu. Seeds and most nuts. Dairy Low-fat or nonfat cheeses, including ricotta and mozzarella. Skim or 1% milk that is liquid, powdered, or evaporated. Buttermilk that is made with low-fat milk. Nonfat or low-fat yogurt. Fats and oils Non-hydrogenated (trans-free) margarines. Vegetable oils, including soybean, sesame, sunflower, olive, peanut, safflower, corn, canola, and cottonseed. Salad dressings or mayonnaise made with a vegetable oil. Beverages Mineral water. Coffee and tea. Diet carbonated beverages. Sweets and desserts Sherbet, gelatin, and fruit ice. Small amounts of dark chocolate. Limit all sweets and desserts. Seasonings and condiments All seasonings and condiments. The items listed above may not be a complete list of foods and drinks you can eat. Contact a dietitian for more options. What foods should I avoid? Fruits Canned fruit in heavy syrup. Fruit in cream or butter sauce. Fried fruit. Limit coconut. Vegetables Vegetables cooked in cheese, cream, or butter sauce. Fried  vegetables. Grains Breads that are made with saturated or trans fats, oils, or whole milk. Croissants. Sweet rolls. Donuts. High-fat crackers, such as cheese crackers. Meats and other proteins Fatty meats, such as hot dogs, ribs, sausage, bacon, rib-eye roast or steak. High-fat deli meats, such as salami and bologna. Caviar. Domestic duck and goose. Organ meats, such as liver. Dairy Cream, sour cream, cream cheese, and creamed cottage cheese. Whole-milk cheeses. Whole or 2% milk that is liquid, evaporated, or condensed. Whole buttermilk. Cream sauce or high-fat cheese sauce. Yogurt that is made from whole milk. Fats and oils Meat fat, or shortening. Cocoa butter, hydrogenated oils, palm oil, coconut oil, palm kernel oil. Solid fats and shortenings, including bacon fat, salt pork, lard, and butter. Nondairy cream substitutes. Salad dressings with cheese or sour cream. Beverages Regular sodas and juice drinks with added sugar. Sweets and desserts Frosting. Pudding. Cookies. Cakes. Pies. Milk chocolate or white chocolate. Buttered syrups. Full-fat ice cream or ice cream drinks. The items listed above may not be a complete list of foods and drinks to avoid. Contact a dietitian for more information. Summary Heart-healthy meal planning includes eating less unhealthy fats, eating more healthy fats, and making other changes in your diet. Eat a balanced diet. This includes fruits and vegetables, low-fat or nonfat dairy, lean protein, nuts and legumes,  whole grains, and heart-healthy oils and fats. This information is not intended to replace advice given to you by your health care provider. Make sure you discuss any questions you have with your health care provider. Document Revised: 03/31/2021 Document Reviewed: 03/31/2021 Elsevier Patient Education  2024 [employer]    1st Floor: - Lobby - Registration  - Pharmacy  - Lab - Cafe  2nd Floor: - PV Lab - Diagnostic Testing (echo, CT, nuclear  med)  3rd Floor: - Vacant  4th Floor: - TCTS (cardiothoracic surgery) - [facility] Clinic - Structural Heart Clinic - Vascular Surgery  - Vascular Ultrasound  5th Floor: - [facility] (general and EP) - Clinical Pharmacy for coumadin, hypertension, lipid, weight-loss medications, and med management appointments    Valet parking services will be available as well.

## 2023-05-25 ENCOUNTER — Encounter: Payer: Self-pay | Admitting: Nurse Practitioner

## 2023-05-26 ENCOUNTER — Other Ambulatory Visit: Payer: Self-pay

## 2023-06-01 ENCOUNTER — Ambulatory Visit: Payer: Medicare (Managed Care) | Admitting: Cardiology

## 2023-06-01 NOTE — Progress Notes (Unsigned)
 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 Liver biopsy 01/08/20 confirmed metastasis from primary colorectal cancer. PET scan showed hypermetabolism to known liver mets, diffuse thoracic and abdominal lymphadenopathy, a 1.8 cm LUL pulmonary nodule, and splenic flexure of colon. -treated with first line FOLFOX 01/25/20 - 10/02/20, Vectibix added with C2. Oxali discontinued after C18 due to reaction. -switched to Xeloda 10/21/20, dose adjusted due to significant skin toxicity -due to cancer progression, treatment has been changed to FOLFIRI and bevacizumab on 03/25/22, she is overall tolerating well -will repeat CT scan after next cycle chemo  -Restaging CT scan from 06/16/2022 showed stable to mild decrease in size of treated liver metastasis. No new lesions -She is tolerating chemotherapy well overall, will continue -Her restaging CT scan on August 31, 2022 showed stable liver metastasis, and a stable 7 mm left lung nodule, indeterminate.  I personally reviewed the scan images with patient -Will continue current therapy.  We discussed the option of maintenance therapy with irinotecan and bevacizumab in future.  She is tolerating current FOLFIRI and bevacizumab well, will continue. -Restaging CT 03/24/2023 showed stable disease.  -Last chemotherapy treatment on 05/05/2023 resulted in ED visit and hospitalization due to coronary artery disease.  She underwent a left heart  catheterization showing severe two-vessel disease with diffuse disease in the small vessels throughout with 90% ramus 1, 80% ramus 2, 75% proximal RCA, 50% mid RCA, and 40% proximal mid LAD stenoses, not amenable to PCI.   -Chemotherapy to be restarted today, 06/02/2023.  Chemotherapy dosing to be lessened to reduce cardiovascular stress.  Bevacizumab to be held.    Coronary artery disease with recent myocardial infarction -Patient has recovered well from mild myocardial infarction.   -Currently on metoprolol 12.5 mg twice daily.  Will refill this today.  Advised her to closely follow-up with cardiology.   -Start chemotherapy today.  Chemotherapy has been dose reduced today to reduce strain on the heart.  Adrucil to be given at 2000 mg/m,  Irinotecan reduced to 150 mg/m, with same dose of leucovorin at 400 mg/m.  Bevacizumab has been discontinued.  Plan:  Labs reviewed.  Mild and stable anemia present.  No other labs stable and unremarkable. Reviewed dose reduction of chemotherapy with discontinuation of bevacizumab.  Patient voiced understanding. Renew metoprolol 12.5 mg twice daily.  Follow-up with cardiology. Proceed with chemotherapy with reduced dosing and no bevacizumab. Labs/flush, follow-up, and treatment on 06/16/2023 as scheduled.  The patient understands the plans discussed today and is in agreement with them.  She knows to contact our office if she develops concerns prior to her next appointment.  I provided 25 minutes of face-to-face time during this encounter and > 50% was spent counseling as documented under my assessment and plan.    Carlean Jews, NP  [facility] - A DEPT OF [facility] [address] [place] [address] Dept: [phone] Dept Fax: [phone]   No  orders of the defined types were placed in this encounter.     CHIEF COMPLAINT:  CC: Metastatic colon cancer  Current Treatment:  Second line FOLFIRI  INTERVAL HISTORY:  Lindsay [name] is here today for repeat clinical assessment.  Last chemotherapy treatment on 05/05/2023 resulted in ED visit and hospitalization due to coronary artery disease.  She underwent a left heart catheterization showing severe two-vessel disease with diffuse disease in the small vessels throughout with 90% ramus 1, 80% ramus 2, 75% proximal RCA, 50% mid RCA, and 40% proximal mid LAD stenoses, not amenable to PCI.  Chemotherapy to be restarted today, 06/02/2023.  Chemotherapy dosing to be lessened to reduce cardiovascular stress.  Bevacizumab to be held. She denies chest pain, chest pressure, or shortness of breath. She denies headaches or visual disturbances. He denies abdominal pain, nausea, vomiting, or changes in bowel or bladder habits.  States she did have some constipation while in the hospital for myocardial infarction.  She was prescribed MiraLAX and this has normalized.  She is consuming a low sodium diet.  Denies fevers or chills. She denies pain. Her appetite is good. Her weight has been stable.  I have reviewed the past medical history, past surgical history, social history and family history with the patient and they are unchanged from previous note.  ALLERGIES:  is allergic to oxaliplatin.  MEDICATIONS:  Current Outpatient Medications  Medication Sig Dispense Refill   aspirin EC 81 MG tablet Take 1 tablet (81 mg total) by mouth daily. Swallow whole. 90 tablet 0   atorvastatin (LIPITOR) 80 MG tablet Take 1 tablet (80 mg total) by mouth daily. 90 tablet 0   clopidogrel (PLAVIX) 75 MG tablet Take 1 tablet (75 mg total) by mouth daily. 90 tablet 0   pantoprazole (PROTONIX) 20 MG tablet Take 1 tablet (20 mg total) by mouth daily. 30 tablet 1   metoprolol tartrate (LOPRESSOR) 25 MG tablet Take 0.5 tablets (12.5 mg total) by mouth 2 (two) times daily. 30 tablet 2   No current facility-administered medications for this visit.   Facility-Administered  Medications Ordered in Other Visits  Medication Dose Route Frequency Provider Last Rate Last Admin   sodium chloride flush (NS) 0.9 % injection 10 mL  10 mL Intracatheter PRN Malachy Mood, MD   10 mL at 06/04/23 1128    HISTORY OF PRESENT ILLNESS:   Oncology History  metastatic colon cancer to liver  06/20/2019 Procedure   Colonoscopy by Dr Meridee Score 06/20/19  IMPRESSION -Seven 3 to 10 mm polyps in the sigmoid colon, in the transverse colon and in the escending colon, removed with a cold snare. Resected and retrireved.  -One 20mm polyp in the descending colon. Biopsies. Tattoes.  -Mediaum sized lipoma in the ascending colon.   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 See Comment B. Colon, Ascending, polyp, Polypectomy:  -TUBULAR ADENOMA -No high grade dysplasia or malignancy.  C. Colon, TRansverse, Polyo, polectomy:  -TUBULAR ADENOMA -No high grade dysplasia or malignancy.  D. Colon, Sigmoid, Polyp, Polypectomy:  -HYPERPLASTIC POLYP   11/07/2019 Imaging   US Abdomen 11/07/19  IMPRESSION: 1. Two solid masses in the liver are nonspecific. Recommend MRI abdomen with and without contrast for further evaluation.   12/15/2019 Imaging   MRI Abdomen 12/15/19  IMPRESSION: 1. There are two large masses in the liver with appearance favoring metastatic disease or hepatocellular carcinoma or cholangiocarcinoma. A benign etiology is highly unlikely given the enhancement pattern  and associated adenopathy. 2. Considerable porta hepatis and retroperitoneal adenopathy. Some of the confluent porta hepatis tumor is potentially infiltrative and abuts the pancreatic body along its right upper margin, making it difficult to completely exclude the possibility of pancreatic adenocarcinoma primary. Possibilities helpful in further workup might include tissue diagnosis, endoscopic ultrasound, or nuclear medicine PET-CT. 3.  Pancreas divisum. 4. Lumbar spondylosis and degenerative disc disease. 5. Despite efforts by the technologist and patient, motion artifact is present on today's exam and could not be eliminated. This reduces exam sensitivity and specificity.   12/22/2019 Procedure   Upper Endoscopy by Dr Elnoria Howard 12/22/19  IMPRESSION - One lymph node was visualized and measured in the porta hepatis region. Fine needle aspiration performed    12/22/2019 Initial Biopsy   A. LIVER, PORTA HEPATIS MASS, FINE NEEDLE 12/22/19 ASPIRATION:  FINAL MICROSCOPIC DIAGNOSIS:  - Malignant cells consistent with metastatic adenocarcinoma   01/01/2020 Initial Diagnosis   Intrahepatic cholangiocarcinoma (HCC)   01/08/2020 Initial Biopsy   FINAL MICROSCOPIC DIAGNOSIS:   A. LIVER, LEFT LOBE, BIOPSY:  - Metastatic adenocarcinoma, consistent with a colorectal primary.  See  comment      COMMENT:   Immunohistochemical stains show the tumor cells are positive for CK20  and CDX2 but negative for CK7, consistent with above interpretation.  Dr. Mosetta Putt was notified on 01/10/2020   01/08/2020 Genetic Testing   [name]  [name] wildtype and [name]/[name] mutations which make her eligible for target biological agent Vectibix.   01/24/2020 Procedure   PAC placed 01/24/20   01/25/2020 -  Chemotherapy   first line FOLFOX starting 01/25/2020, Vextibix  added with C2 (02/06/20)   02/06/2020 - 02/06/2022 Chemotherapy   Patient is on Treatment Plan : COLORECTAL Panitumumab q14d ([name] Wild - Type Gene Only)     06/20/2020 Imaging   CT CAP  IMPRESSION: 1. Continued interval reduction in size and conspicuity of a subsolid nodule of the peripheral left upper lobe. 2. Unchanged prominent pretracheal and subcarinal lymph nodes. 3. Redemonstrated partially calcified low-attenuation liver masses, slightly decreased in size compared to prior examination. 4. Slight interval decrease in size of a portacaval lymph node or conglomerate  and retroperitoneal lymph nodes. 5. Findings are consistent with continued treatment response of nodal, pulmonary, and hepatic metastatic disease. 6. Coronary artery disease.   Aortic Atherosclerosis (ICD10-I70.0).     10/09/2020 Imaging   IMPRESSION: 1. Slight decrease in size of dominant liver mass with smaller liver mass within 1-2 mm of prior measurement. 2. Stable appearance of celiac lymph and retroperitoneal lymph nodes. Dominant node with calcification in the gastrohepatic ligament as described. 3. Continued decrease in size of LEFT upper lobe nodule. 4. Three-vessel coronary artery calcification. 5. Aortic atherosclerosis.   03/25/2022 -  Chemotherapy   Patient is on Treatment Plan : COLORECTAL FOLFIRI + Bevacizumab q14d     08/31/2022 Imaging    IMPRESSION: 1. Unchanged, densely calcified liver lesions, consistent with treated metastatic disease. 2. Unchanged, irregular subpleural opacity of the peripheral left upper lobe. 3. Unchanged subcentimeter gastrohepatic ligament lymph node. 4. No evidence of new metastatic disease in the chest, abdomen, or pelvis. 5. Status post hysterectomy. 6. Coronary artery disease. 7. Aortic valve calcifications. Correlate for echocardiographic evidence of aortic valve dysfunction.         REVIEW OF SYSTEMS:   Constitutional: Denies fevers, chills or abnormal weight loss Eyes: Denies blurriness of vision Ears, nose, mouth, throat, and face: Denies mucositis or sore throat Respiratory: Denies cough, dyspnea or wheezes  Cardiovascular: Denies palpitation, chest discomfort or lower extremity swelling Gastrointestinal:  Denies nausea, heartburn or change in bowel habits Skin: Denies abnormal skin rashes Lymphatics: Denies new lymphadenopathy or easy bruising Neurological:Denies numbness, tingling or new weaknesses Behavioral/Psych: Mood is stable, no new changes  All other systems were reviewed with the patient and are  negative.   VITALS:   Today's Vitals   06/02/23 0955 06/02/23 1000  BP: 138/64   Pulse: 76   Resp: 19   Temp: 97.7 F (36.5 C)   TempSrc: Temporal   SpO2: 99%   Weight: 177 lb (80.3 kg)   Height: 5\' 6"  (1.676 m)   PainSc:  0-No pain   Body mass index is 28.57 kg/m.   Wt Readings from Last 3 Encounters:  06/02/23 177 lb (80.3 kg)  05/24/23 177 lb 3.2 oz (80.4 kg)  05/08/23 185 lb 6.5 oz (84.1 kg)    Body mass index is 28.57 kg/m.  Performance status (ECOG): 1 - Symptomatic but completely ambulatory  PHYSICAL EXAM:   GENERAL:alert, no distress and comfortable SKIN: skin color, texture, turgor are normal, no rashes or significant lesions EYES: normal, Conjunctiva are pink and non-injected, sclera clear OROPHARYNX:no exudate, no erythema and lips, buccal mucosa, and tongue normal  NECK: supple, thyroid normal size, non-tender, without nodularity LYMPH:  no palpable lymphadenopathy in the cervical, axillary or inguinal LUNGS: clear to auscultation and percussion with normal breathing effort HEART: regular rate & rhythm and no murmurs and no lower extremity edema ABDOMEN:abdomen soft, non-tender and normal bowel sounds Musculoskeletal:no cyanosis of digits and no clubbing  NEURO: alert & oriented x 3 with fluent speech, no focal motor/sensory deficits  LABORATORY DATA:  I have reviewed the data as listed    Component Value Date/Time   NA 139 06/02/2023 0934   K 3.6 06/02/2023 0934   CL 104 06/02/2023 0934   CO2 29 06/02/2023 0934   GLUCOSE 172 (H) 06/02/2023 0934   BUN 8 06/02/2023 0934   CREATININE 0.58 06/02/2023 0934   CALCIUM 9.4 06/02/2023 0934   PROT 7.4 06/02/2023 0934   ALBUMIN 4.0 06/02/2023 0934   AST 22 06/02/2023 0934   ALT 25 06/02/2023 0934   ALKPHOS 127 (H) 06/02/2023 0934   BILITOT 0.3 06/02/2023 0934   GFRNONAA >60 06/02/2023 0934   GFRAA >60 07/09/2016 0836     Lab Results  Component Value Date   WBC 6.2 06/02/2023   NEUTROABS 4.3  06/02/2023   HGB 11.6 (L) 06/02/2023   HCT 36.8 06/02/2023   MCV 90.6 06/02/2023   PLT 236 06/02/2023       RADIOGRAPHIC STUDIES: CARDIAC CATHETERIZATION Result Date: 05/07/2023 Images from the original result were not included.   Ramus-1 lesion is 90% stenosed.   Ramus-2 lesion is 80% stenosed.   Prox RCA lesion is 75% stenosed.   Mid RCA lesion is 50% stenosed.   Prox LAD to Mid LAD lesion is 40% stenosed.   LV end diastolic pressure is mildly elevated.   There is (mild-) moderate aortic valve stenosis. Dominance: Right Severe two-vessel disease with diffuse disease small vessels throughout colon likely culprit is 90% followed by 80% lesions and a 1.5 mm high Diag/Ramus, along with a focal 75% stenosis and a very small caliber RCA followed by segmental 50% stenosis. Mild diffuse disease in the proximal LAD -Lesions are not amenable to PCI based on the small caliber vessels. Best suited for medical management. Relative normal LVEDP. RECOMMENDATIONS   Anticipated discharge date to  be determined.  She will be returned to [facility] for ongoing care   Recommend uninterrupted dual antiplatelet therapy with Aspirin 81mg  daily and Clopidogrel 75mg  daily for a minimum of 12 months (ACS-Class I recommendation). Would be okay to simply do Plavix monotherapy after first 3 months given the ACS presentation.   Continue to titrate GDMT for CAD.  [name], MD  ECHOCARDIOGRAM COMPLETE Result Date: 05/06/2023    ECHOCARDIOGRAM REPORT   Patient Name:   Lindsay [name] Date of Exam: 05/06/2023 Medical Rec #:  [id number]      Height:       66.0 in Accession #:    [id number]     Weight:       180.8 lb Date of Birth:  10/17/1955      BSA:          1.916 m Patient Age:    67 years       BP:           120/68 mmHg Patient Gender: F              HR:           64 bpm. Exam Location:  Inpatient Procedure: 2D Echo, Cardiac Doppler and Color Doppler (Both Spectral and Color            Flow Doppler were utilized during  procedure). Indications:    CHF Acute Diastolic  History:        Patient has no prior history of Echocardiogram examinations.  Sonographer:    Karma Ganja Referring Phys: [id number] VISHAL R PATEL IMPRESSIONS  1. Left ventricular ejection fraction, by estimation, is 55 to 60%. The left ventricle has normal function. The left ventricle has no regional wall motion abnormalities. Left ventricular diastolic parameters are consistent with Grade II diastolic dysfunction (pseudonormalization).  2. Right ventricular systolic function is normal. The right ventricular size is normal.  3. Left atrial size was mildly dilated.  4. The mitral valve is normal in structure. Trivial mitral valve regurgitation. No evidence of mitral stenosis.  5. Visually calcified with decreased stroke volume index and DVI of 0.30 and acceleration time greater thant 100 ms. The aortic valve is calcified. Aortic valve regurgitation is not visualized. Moderate to severe aortic valve stenosis. Aortic valve area, by VTI measures 0.85 cm. Aortic valve mean gradient measures 20.0 mmHg. Aortic valve Vmax measures 3.07 m/s. Aortic valve acceleration time measures 105 msec. Comparison(s): No prior Echocardiogram. FINDINGS  Left Ventricle: Left ventricular ejection fraction, by estimation, is 55 to 60%. The left ventricle has normal function. The left ventricle has no regional wall motion abnormalities. Strain imaging was not performed. The left ventricular internal cavity  size was normal in size. There is no left ventricular hypertrophy. Left ventricular diastolic parameters are consistent with Grade II diastolic dysfunction (pseudonormalization). Right Ventricle: The right ventricular size is normal. No increase in right ventricular wall thickness. Right ventricular systolic function is normal. Left Atrium: Left atrial size was mildly dilated. Right Atrium: Right atrial size was normal in size. Pericardium: There is no evidence of pericardial effusion.  Mitral Valve: The mitral valve is normal in structure. Trivial mitral valve regurgitation. No evidence of mitral valve stenosis. Tricuspid Valve: The tricuspid valve is normal in structure. Tricuspid valve regurgitation is not demonstrated. Aortic Valve: Visually calcified with decreased stroke volume index and DVI of 0.30 and acceleration time greater thant 100 ms. The aortic valve is calcified. Aortic valve regurgitation is not visualized. Moderate  to severe aortic stenosis is present. Aortic valve mean gradient measures 20.0 mmHg. Aortic valve peak gradient measures 37.7 mmHg. Aortic valve area, by VTI measures 0.85 cm. Pulmonic Valve: The pulmonic valve was normal in structure. Pulmonic valve regurgitation is not visualized. No evidence of pulmonic stenosis. Aorta: The aortic root, ascending aorta and aortic arch are all structurally normal, with no evidence of dilitation or obstruction. IAS/Shunts: The atrial septum is grossly normal. Additional Comments: 3D imaging was not performed.  LEFT VENTRICLE PLAX 2D LVIDd:         4.60 cm   Diastology LVIDs:         2.90 cm   LV e' medial:    6.31 cm/s LV PW:         1.00 cm   LV E/e' medial:  17.6 LV IVS:        0.80 cm   LV e' lateral:   6.96 cm/s LVOT diam:     1.90 cm   LV E/e' lateral: 15.9 LV SV:         59 LV SV Index:   31 LVOT Area:     2.84 cm  RIGHT VENTRICLE RV Basal diam:  3.90 cm RV S prime:     9.90 cm/s TAPSE (M-mode): 2.4 cm LEFT ATRIUM             Index        RIGHT ATRIUM           Index LA diam:        4.10 cm 2.14 cm/m   RA Area:     14.80 cm LA Vol (A2C):   73.1 ml 38.16 ml/m  RA Volume:   35.70 ml  18.64 ml/m LA Vol (A4C):   56.4 ml 29.44 ml/m LA Biplane Vol: 65.5 ml 34.19 ml/m  AORTIC VALVE AV Area (Vmax):    0.85 cm AV Area (Vmean):   0.78 cm AV Area (VTI):     0.85 cm AV Vmax:           307.00 cm/s AV Vmean:          209.500 cm/s AV VTI:            0.691 m AV Peak Grad:      37.7 mmHg AV Mean Grad:      20.0 mmHg LVOT Vmax:          91.80 cm/s LVOT Vmean:        57.450 cm/s LVOT VTI:          0.207 m LVOT/AV VTI ratio: 0.30  AORTA Ao Root diam: 3.20 cm MITRAL VALVE MV Area (PHT): 3.08 cm     SHUNTS MV Decel Time: 246 msec     Systemic VTI:  0.21 m MV E velocity: 111.00 cm/s  Systemic Diam: 1.90 cm MV A velocity: 102.00 cm/s MV E/A ratio:  1.09 Riley Lam MD Electronically signed by Riley Lam MD Signature Date/Time: 05/06/2023/2:02:31 PM    Final    DG Chest Port 1 View Result Date: 05/05/2023 CLINICAL DATA:  Metastatic colon cancer, chest pain during chemotherapy EXAM: PORTABLE CHEST 1 VIEW COMPARISON:  03/24/2023, 07/21/2022 FINDINGS: Single frontal view of the chest demonstrates stable right chest wall port. The cardiac silhouette is unremarkable. No acute airspace disease, effusion, or pneumothorax. No acute displaced fractures. IMPRESSION: 1. No acute intrathoracic process. Electronically Signed   By: Sharlet Salina M.D.   On: 05/05/2023 15:28

## 2023-06-01 NOTE — Assessment & Plan Note (Signed)
 MSS, [name]/[name] wildtype -diagnosed 12/2019 by porta hepatis LN biopsy during EUS for work up of abdominal pain and liver lesions seen on [place] and MRI. Liver biopsy 01/08/20 confirmed metastasis from primary colorectal cancer. PET scan showed hypermetabolism to known liver mets, diffuse thoracic and abdominal lymphadenopathy, a 1.8 cm LUL pulmonary nodule, and splenic flexure of colon. -treated with first line FOLFOX 01/25/20 - 10/02/20, Vectibix added with C2. Oxali discontinued after C18 due to reaction. -switched to Xeloda 10/21/20, dose adjusted due to significant skin toxicity -due to cancer progression, treatment has been changed to FOLFIRI and bevacizumab on 03/25/22, she is overall tolerating well -will repeat CT scan after next cycle chemo  -Restaging CT scan from 06/16/2022 showed stable to mild decrease in size of treated liver metastasis. No new lesions -She is tolerating chemotherapy well overall, will continue -Her restaging CT scan on August 31, 2022 showed stable liver metastasis, and a stable 7 mm left lung nodule, indeterminate.  I personally reviewed the scan images with patient -Will continue current therapy.  We discussed the option of maintenance therapy with irinotecan and bevacizumab in future.  She is tolerating current FOLFIRI and bevacizumab well, will continue. -Restaging CT 03/24/2023 showed stable disease.  -Last chemotherapy treatment on 05/05/2023 resulted in ED visit and hospitalization due to coronary artery disease.  She underwent a left heart catheterization showing severe two-vessel disease with diffuse disease in the small vessels throughout with 90% ramus 1, 80% ramus 2, 75% proximal RCA, 50% mid RCA, and 40% proximal mid LAD stenoses, not amenable to PCI.  Chemotherapy to be restarted today, 06/02/2023.  Chemotherapy dosing to be lessened to reduce cardiovascular stress.  Bevacizumab to be held.

## 2023-06-02 ENCOUNTER — Inpatient Hospital Stay: Payer: Medicare (Managed Care)

## 2023-06-02 ENCOUNTER — Inpatient Hospital Stay: Payer: Medicare (Managed Care) | Admitting: Nurse Practitioner

## 2023-06-02 VITALS — BP 138/64 | HR 76 | Temp 97.7°F | Resp 19 | Ht 66.0 in | Wt 177.0 lb

## 2023-06-02 DIAGNOSIS — C221 Intrahepatic bile duct carcinoma: Secondary | ICD-10-CM | POA: Diagnosis not present

## 2023-06-02 DIAGNOSIS — Z5111 Encounter for antineoplastic chemotherapy: Secondary | ICD-10-CM | POA: Diagnosis not present

## 2023-06-02 DIAGNOSIS — Z95828 Presence of other vascular implants and grafts: Secondary | ICD-10-CM

## 2023-06-02 LAB — CBC WITH DIFFERENTIAL (CANCER CENTER ONLY)
Abs Immature Granulocytes: 0.02 10*3/uL (ref 0.00–0.07)
Basophils Absolute: 0 10*3/uL (ref 0.0–0.1)
Basophils Relative: 1 %
Eosinophils Absolute: 0.2 10*3/uL (ref 0.0–0.5)
Eosinophils Relative: 3 %
HCT: 36.8 % (ref 36.0–46.0)
Hemoglobin: 11.6 g/dL — ABNORMAL LOW (ref 12.0–15.0)
Immature Granulocytes: 0 %
Lymphocytes Relative: 21 %
Lymphs Abs: 1.3 10*3/uL (ref 0.7–4.0)
MCH: 28.6 pg (ref 26.0–34.0)
MCHC: 31.5 g/dL (ref 30.0–36.0)
MCV: 90.6 fL (ref 80.0–100.0)
Monocytes Absolute: 0.4 10*3/uL (ref 0.1–1.0)
Monocytes Relative: 6 %
Neutro Abs: 4.3 10*3/uL (ref 1.7–7.7)
Neutrophils Relative %: 69 %
Platelet Count: 236 10*3/uL (ref 150–400)
RBC: 4.06 MIL/uL (ref 3.87–5.11)
RDW: 15.7 % — ABNORMAL HIGH (ref 11.5–15.5)
WBC Count: 6.2 10*3/uL (ref 4.0–10.5)
nRBC: 0 % (ref 0.0–0.2)

## 2023-06-02 LAB — CMP (CANCER CENTER ONLY)
ALT: 25 U/L (ref 0–44)
AST: 22 U/L (ref 15–41)
Albumin: 4 g/dL (ref 3.5–5.0)
Alkaline Phosphatase: 127 U/L — ABNORMAL HIGH (ref 38–126)
Anion gap: 6 (ref 5–15)
BUN: 8 mg/dL (ref 8–23)
CO2: 29 mmol/L (ref 22–32)
Calcium: 9.4 mg/dL (ref 8.9–10.3)
Chloride: 104 mmol/L (ref 98–111)
Creatinine: 0.58 mg/dL (ref 0.44–1.00)
GFR, Estimated: >60 mL/min (ref >60)
Glucose, Bld: 172 mg/dL — ABNORMAL HIGH (ref 70–99)
Potassium: 3.6 mmol/L (ref 3.5–5.1)
Sodium: 139 mmol/L (ref 135–145)
Total Bilirubin: 0.3 mg/dL (ref 0.0–1.2)
Total Protein: 7.4 g/dL (ref 6.5–8.1)

## 2023-06-02 LAB — TOTAL PROTEIN, URINE DIPSTICK: Protein, ur: NEGATIVE mg/dL

## 2023-06-02 MED ORDER — SODIUM CHLORIDE 0.9 % IV SOLN
2000.0000 mg/m2 | INTRAVENOUS | Status: DC
  Administered 2023-06-02: 3900 mg via INTRAVENOUS
  Filled 2023-06-02: qty 78

## 2023-06-02 MED ORDER — SODIUM CHLORIDE 0.9 % IV SOLN
150.0000 mg/m2 | Freq: Once | INTRAVENOUS | Status: AC
  Administered 2023-06-02: 300 mg via INTRAVENOUS
  Filled 2023-06-02: qty 15

## 2023-06-02 MED ORDER — SODIUM CHLORIDE 0.9% FLUSH
10.0000 mL | INTRAVENOUS | Status: DC | PRN
  Administered 2023-06-02: 10 mL

## 2023-06-02 MED ORDER — SODIUM CHLORIDE 0.9 % IV SOLN
400.0000 mg/m2 | Freq: Once | INTRAVENOUS | Status: AC
  Administered 2023-06-02: 780 mg via INTRAVENOUS
  Filled 2023-06-02: qty 17.5

## 2023-06-02 MED ORDER — PALONOSETRON HCL INJECTION 0.25 MG/5ML
0.2500 mg | Freq: Once | INTRAVENOUS | Status: AC
  Administered 2023-06-02: 0.25 mg via INTRAVENOUS
  Filled 2023-06-02: qty 5

## 2023-06-02 MED ORDER — DEXAMETHASONE SODIUM PHOSPHATE 10 MG/ML IJ SOLN
10.0000 mg | Freq: Once | INTRAMUSCULAR | Status: AC
  Administered 2023-06-02: 10 mg via INTRAVENOUS
  Filled 2023-06-02: qty 1

## 2023-06-02 MED ORDER — SODIUM CHLORIDE 0.9% FLUSH
10.0000 mL | Freq: Once | INTRAVENOUS | Status: AC
Start: 2023-06-02 — End: 2023-06-02
  Administered 2023-06-02: 10 mL

## 2023-06-02 MED ORDER — SODIUM CHLORIDE 0.9 % IV SOLN: Freq: Once | INTRAVENOUS | Status: AC

## 2023-06-02 MED ORDER — METOPROLOL TARTRATE 25 MG PO TABS: 12.5000 mg | ORAL_TABLET | Freq: Two times a day (BID) | ORAL | 2 refills | Status: DC

## 2023-06-02 MED ORDER — ATROPINE SULFATE 1 MG/ML IV SOLN
0.5000 mg | Freq: Once | INTRAVENOUS | Status: AC | PRN
  Administered 2023-06-02: 0.5 mg via INTRAVENOUS
  Filled 2023-06-02: qty 1

## 2023-06-02 NOTE — Patient Instructions (Signed)
 [facility] - A DEPT OF [facility]  Discharge Instructions: Thank you for choosing [facility] to provide your oncology and hematology care.   If you have a lab appointment with the Cancer Center, please go directly to the Cancer Center and check in at the registration area.   Wear comfortable clothing and clothing appropriate for easy access to any Portacath or PICC line.   We strive to give you quality time with your provider. You may need to reschedule your appointment if you arrive late (15 or more minutes).  Arriving late affects you and other patients whose appointments are after yours.  Also, if you miss three or more appointments without notifying the office, you may be dismissed from the clinic at the provider's discretion.      For prescription refill requests, have your pharmacy contact our office and allow 72 hours for refills to be completed.    Today you received the following chemotherapy and/or immunotherapy agents Irinotecan/Leucovorin/5FU pump      To help prevent nausea and vomiting after your treatment, we encourage you to take your nausea medication as directed.  BELOW ARE SYMPTOMS THAT SHOULD BE REPORTED IMMEDIATELY: *FEVER GREATER THAN 100.4 F (38 C) OR HIGHER *CHILLS OR SWEATING *NAUSEA AND VOMITING THAT IS NOT CONTROLLED WITH YOUR NAUSEA MEDICATION *UNUSUAL SHORTNESS OF BREATH *UNUSUAL BRUISING OR BLEEDING *URINARY PROBLEMS (pain or burning when urinating, or frequent urination) *BOWEL PROBLEMS (unusual diarrhea, constipation, pain near the anus) TENDERNESS IN MOUTH AND THROAT WITH OR WITHOUT PRESENCE OF ULCERS (sore throat, sores in mouth, or a toothache) UNUSUAL RASH, SWELLING OR PAIN  UNUSUAL VAGINAL DISCHARGE OR ITCHING   Items with * indicate a potential emergency and should be followed up as soon as possible or go to the Emergency Department if any problems should occur.  Please show the CHEMOTHERAPY ALERT  CARD or IMMUNOTHERAPY ALERT CARD at check-in to the Emergency Department and triage nurse.  Should you have questions after your visit or need to cancel or reschedule your appointment, please contact [facility] - A DEPT OF [facility]  Dept: [phone]  and follow the prompts.  Office hours are 8:00 a.m. to 4:30 p.m. Monday - Friday. Please note that voicemails left after 4:00 p.m. may not be returned until the following business day.  We are closed weekends and major holidays. You have access to a nurse at all times for urgent questions. Please call the main number to the clinic Dept: [phone] and follow the prompts.   For any non-urgent questions, you may also contact your provider using MyChart. We now offer e-Visits for anyone 3 and older to request care online for non-urgent symptoms. For details visit [email or web address].   Also download the MyChart app! Go to the app store, search "MyChart", open the app, select [facility], and log in with your MyChart username and password.

## 2023-06-04 ENCOUNTER — Inpatient Hospital Stay: Payer: Medicare (Managed Care)

## 2023-06-04 ENCOUNTER — Encounter: Payer: Self-pay | Admitting: Hematology

## 2023-06-04 ENCOUNTER — Encounter: Payer: Self-pay | Admitting: Nurse Practitioner

## 2023-06-04 VITALS — BP 126/62 | HR 61 | Temp 98.9°F | Resp 17

## 2023-06-04 DIAGNOSIS — Z5111 Encounter for antineoplastic chemotherapy: Secondary | ICD-10-CM | POA: Diagnosis not present

## 2023-06-04 DIAGNOSIS — C221 Intrahepatic bile duct carcinoma: Secondary | ICD-10-CM

## 2023-06-04 MED ORDER — PEGFILGRASTIM-CBQV 6 MG/0.6ML ~~LOC~~ SOSY
6.0000 mg | PREFILLED_SYRINGE | Freq: Once | SUBCUTANEOUS | Status: AC
  Administered 2023-06-04: 6 mg via SUBCUTANEOUS
  Filled 2023-06-04: qty 0.6

## 2023-06-04 MED ORDER — SODIUM CHLORIDE 0.9% FLUSH
10.0000 mL | INTRAVENOUS | Status: DC | PRN
  Administered 2023-06-04: 10 mL

## 2023-06-04 MED ORDER — HEPARIN SOD (PORK) LOCK FLUSH 100 UNIT/ML IV SOLN
500.0000 [IU] | Freq: Once | INTRAVENOUS | Status: AC | PRN
  Administered 2023-06-04: 500 [IU]

## 2023-06-15 NOTE — Assessment & Plan Note (Signed)
-  overall mild and stable

## 2023-06-15 NOTE — Assessment & Plan Note (Signed)
 MSS, [name]/[name] wildtype -diagnosed 12/2019 by porta hepatis LN biopsy during EUS for work up of abdominal pain and liver lesions seen on [place] and MRI. Liver biopsy 01/08/20 confirmed metastasis from primary colorectal cancer. PET scan showed hypermetabolism to known liver mets, diffuse thoracic and abdominal lymphadenopathy, a 1.8 cm LUL pulmonary nodule, and splenic flexure of colon. -treated with first line FOLFOX 01/25/20 - 10/02/20, Vectibix added with C2. Oxali discontinued after C18 due to reaction. -switched to Xeloda 10/21/20, dose adjusted due to significant skin toxicity -due to cancer progression, treatment has been changed to FOLFIRI and bevacizumab on 03/25/22, she is overall tolerating well -will repeat CT scan after next cycle chemo  -Restaging CT scan from 06/16/2022 showed stable to mild decrease in size of treated liver metastasis. No new lesions -She is tolerating chemotherapy well overall, will continue -Her restaging CT scan on August 31, 2022 showed stable liver metastasis, and a stable 7 mm left lung nodule, indeterminate.  I personally reviewed the scan images with patient -Will continue current therapy.  We discussed the option of maintenance therapy with irinotecan and bevacizumab in future.  She is tolerating current FOLFIRI and bevacizumab well, will continue. -Restaging CT 03/24/2023 showed stable disease.

## 2023-06-16 ENCOUNTER — Inpatient Hospital Stay: Payer: Medicare (Managed Care) | Attending: Hematology

## 2023-06-16 ENCOUNTER — Inpatient Hospital Stay: Payer: Medicare (Managed Care)

## 2023-06-16 ENCOUNTER — Inpatient Hospital Stay (HOSPITAL_BASED_OUTPATIENT_CLINIC_OR_DEPARTMENT_OTHER): Payer: Medicare (Managed Care) | Admitting: Hematology

## 2023-06-16 ENCOUNTER — Encounter: Payer: Self-pay | Admitting: Hematology

## 2023-06-16 VITALS — BP 118/64 | HR 55 | Temp 97.7°F | Resp 21 | Ht 66.0 in | Wt 174.3 lb

## 2023-06-16 DIAGNOSIS — T451X5A Adverse effect of antineoplastic and immunosuppressive drugs, initial encounter: Secondary | ICD-10-CM | POA: Diagnosis not present

## 2023-06-16 DIAGNOSIS — Z5111 Encounter for antineoplastic chemotherapy: Secondary | ICD-10-CM | POA: Diagnosis present

## 2023-06-16 DIAGNOSIS — T451X5D Adverse effect of antineoplastic and immunosuppressive drugs, subsequent encounter: Secondary | ICD-10-CM | POA: Insufficient documentation

## 2023-06-16 DIAGNOSIS — Z90710 Acquired absence of both cervix and uterus: Secondary | ICD-10-CM | POA: Diagnosis not present

## 2023-06-16 DIAGNOSIS — G620 Drug-induced polyneuropathy: Secondary | ICD-10-CM | POA: Insufficient documentation

## 2023-06-16 DIAGNOSIS — C221 Intrahepatic bile duct carcinoma: Secondary | ICD-10-CM

## 2023-06-16 DIAGNOSIS — Z79899 Other long term (current) drug therapy: Secondary | ICD-10-CM | POA: Diagnosis not present

## 2023-06-16 DIAGNOSIS — C19 Malignant neoplasm of rectosigmoid junction: Secondary | ICD-10-CM | POA: Insufficient documentation

## 2023-06-16 DIAGNOSIS — C189 Malignant neoplasm of colon, unspecified: Secondary | ICD-10-CM

## 2023-06-16 DIAGNOSIS — C787 Secondary malignant neoplasm of liver and intrahepatic bile duct: Secondary | ICD-10-CM | POA: Diagnosis present

## 2023-06-16 DIAGNOSIS — Z5189 Encounter for other specified aftercare: Secondary | ICD-10-CM | POA: Insufficient documentation

## 2023-06-16 DIAGNOSIS — Z95828 Presence of other vascular implants and grafts: Secondary | ICD-10-CM

## 2023-06-16 DIAGNOSIS — C7800 Secondary malignant neoplasm of unspecified lung: Secondary | ICD-10-CM | POA: Diagnosis not present

## 2023-06-16 LAB — CMP (CANCER CENTER ONLY)
ALT: 13 U/L (ref 0–44)
AST: 13 U/L — ABNORMAL LOW (ref 15–41)
Albumin: 4 g/dL (ref 3.5–5.0)
Alkaline Phosphatase: 135 U/L — ABNORMAL HIGH (ref 38–126)
Anion gap: 8 (ref 5–15)
BUN: 7 mg/dL — ABNORMAL LOW (ref 8–23)
CO2: 30 mmol/L (ref 22–32)
Calcium: 9.7 mg/dL (ref 8.9–10.3)
Chloride: 103 mmol/L (ref 98–111)
Creatinine: 0.63 mg/dL (ref 0.44–1.00)
GFR, Estimated: >60 mL/min (ref >60)
Glucose, Bld: 130 mg/dL — ABNORMAL HIGH (ref 70–99)
Potassium: 3.8 mmol/L (ref 3.5–5.1)
Sodium: 141 mmol/L (ref 135–145)
Total Bilirubin: 0.3 mg/dL (ref 0.0–1.2)
Total Protein: 7.6 g/dL (ref 6.5–8.1)

## 2023-06-16 LAB — CBC WITH DIFFERENTIAL (CANCER CENTER ONLY)
Abs Immature Granulocytes: 0.04 10*3/uL (ref 0.00–0.07)
Basophils Absolute: 0.1 10*3/uL (ref 0.0–0.1)
Basophils Relative: 0 %
Eosinophils Absolute: 0.1 10*3/uL (ref 0.0–0.5)
Eosinophils Relative: 1 %
HCT: 36.9 % (ref 36.0–46.0)
Hemoglobin: 11.7 g/dL — ABNORMAL LOW (ref 12.0–15.0)
Immature Granulocytes: 0 %
Lymphocytes Relative: 13 %
Lymphs Abs: 1.5 10*3/uL (ref 0.7–4.0)
MCH: 28.1 pg (ref 26.0–34.0)
MCHC: 31.7 g/dL (ref 30.0–36.0)
MCV: 88.7 fL (ref 80.0–100.0)
Monocytes Absolute: 0.7 10*3/uL (ref 0.1–1.0)
Monocytes Relative: 6 %
Neutro Abs: 9.1 10*3/uL — ABNORMAL HIGH (ref 1.7–7.7)
Neutrophils Relative %: 80 %
Platelet Count: 274 10*3/uL (ref 150–400)
RBC: 4.16 MIL/uL (ref 3.87–5.11)
RDW: 15.9 % — ABNORMAL HIGH (ref 11.5–15.5)
WBC Count: 11.4 10*3/uL — ABNORMAL HIGH (ref 4.0–10.5)
nRBC: 0 % (ref 0.0–0.2)

## 2023-06-16 LAB — CEA (ACCESS): CEA (CHCC): 76.01 ng/mL — ABNORMAL HIGH (ref 0.00–5.00)

## 2023-06-16 LAB — TOTAL PROTEIN, URINE DIPSTICK: Protein, ur: NEGATIVE mg/dL

## 2023-06-16 MED ORDER — SODIUM CHLORIDE 0.9% FLUSH
10.0000 mL | Freq: Once | INTRAVENOUS | Status: AC
  Administered 2023-06-16: 10 mL

## 2023-06-16 MED ORDER — SODIUM CHLORIDE 0.9 % IV SOLN
Freq: Once | INTRAVENOUS | Status: AC
Start: 2023-06-16 — End: 2023-06-16

## 2023-06-16 MED ORDER — ATROPINE SULFATE 1 MG/ML IV SOLN
0.5000 mg | Freq: Once | INTRAVENOUS | Status: AC | PRN
  Administered 2023-06-16: 0.5 mg via INTRAVENOUS

## 2023-06-16 MED ORDER — LEUCOVORIN CALCIUM INJECTION 350 MG
400.0000 mg/m2 | Freq: Once | INTRAMUSCULAR | Status: AC
  Administered 2023-06-16: 780 mg via INTRAVENOUS
  Filled 2023-06-16: qty 39

## 2023-06-16 MED ORDER — SODIUM CHLORIDE 0.9 % IV SOLN
150.0000 mg/m2 | Freq: Once | INTRAVENOUS | Status: AC
  Administered 2023-06-16: 300 mg via INTRAVENOUS
  Filled 2023-06-16: qty 15

## 2023-06-16 MED ORDER — DEXAMETHASONE SODIUM PHOSPHATE 10 MG/ML IJ SOLN
10.0000 mg | Freq: Once | INTRAMUSCULAR | Status: AC
  Administered 2023-06-16: 10 mg via INTRAVENOUS
  Filled 2023-06-16: qty 1

## 2023-06-16 MED ORDER — PALONOSETRON HCL INJECTION 0.25 MG/5ML
0.2500 mg | Freq: Once | INTRAVENOUS | Status: AC
Start: 2023-06-16 — End: 2023-06-16
  Administered 2023-06-16: 0.25 mg via INTRAVENOUS
  Filled 2023-06-16: qty 5

## 2023-06-16 MED ORDER — SODIUM CHLORIDE 0.9 % IV SOLN
2000.0000 mg/m2 | INTRAVENOUS | Status: DC
  Administered 2023-06-16: 3900 mg via INTRAVENOUS
  Filled 2023-06-16: qty 78

## 2023-06-16 NOTE — Patient Instructions (Signed)
 [facility] - A DEPT OF [facility]  Discharge Instructions: Thank you for choosing [facility] to provide your oncology and hematology care.   If you have a lab appointment with the Cancer Center, please go directly to the Cancer Center and check in at the registration area.   Wear comfortable clothing and clothing appropriate for easy access to any Portacath or PICC line.   We strive to give you quality time with your provider. You may need to reschedule your appointment if you arrive late (15 or more minutes).  Arriving late affects you and other patients whose appointments are after yours.  Also, if you miss three or more appointments without notifying the office, you may be dismissed from the clinic at the provider's discretion.      For prescription refill requests, have your pharmacy contact our office and allow 72 hours for refills to be completed.    Today you received the following chemotherapy and/or immunotherapy agents Irinotecan/Leucovorin/5FU pump      To help prevent nausea and vomiting after your treatment, we encourage you to take your nausea medication as directed.  BELOW ARE SYMPTOMS THAT SHOULD BE REPORTED IMMEDIATELY: *FEVER GREATER THAN 100.4 F (38 C) OR HIGHER *CHILLS OR SWEATING *NAUSEA AND VOMITING THAT IS NOT CONTROLLED WITH YOUR NAUSEA MEDICATION *UNUSUAL SHORTNESS OF BREATH *UNUSUAL BRUISING OR BLEEDING *URINARY PROBLEMS (pain or burning when urinating, or frequent urination) *BOWEL PROBLEMS (unusual diarrhea, constipation, pain near the anus) TENDERNESS IN MOUTH AND THROAT WITH OR WITHOUT PRESENCE OF ULCERS (sore throat, sores in mouth, or a toothache) UNUSUAL RASH, SWELLING OR PAIN  UNUSUAL VAGINAL DISCHARGE OR ITCHING   Items with * indicate a potential emergency and should be followed up as soon as possible or go to the Emergency Department if any problems should occur.  Please show the CHEMOTHERAPY ALERT  CARD or IMMUNOTHERAPY ALERT CARD at check-in to the Emergency Department and triage nurse.  Should you have questions after your visit or need to cancel or reschedule your appointment, please contact [facility] - A DEPT OF [facility]  Dept: [phone]  and follow the prompts.  Office hours are 8:00 a.m. to 4:30 p.m. Monday - Friday. Please note that voicemails left after 4:00 p.m. may not be returned until the following business day.  We are closed weekends and major holidays. You have access to a nurse at all times for urgent questions. Please call the main number to the clinic Dept: [phone] and follow the prompts.   For any non-urgent questions, you may also contact your provider using MyChart. We now offer e-Visits for anyone 3 and older to request care online for non-urgent symptoms. For details visit [email or web address].   Also download the MyChart app! Go to the app store, search "MyChart", open the app, select [facility], and log in with your MyChart username and password.

## 2023-06-16 NOTE — Progress Notes (Signed)
 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 Liver biopsy 01/08/20 confirmed metastasis from primary colorectal cancer. PET scan showed hypermetabolism to known liver mets, diffuse thoracic and abdominal lymphadenopathy, a 1.8 cm LUL pulmonary nodule, and splenic flexure of colon. -treated with first line FOLFOX 01/25/20 - 10/02/20, Vectibix added with C2. Oxali discontinued after C18 due to reaction. -switched to Xeloda 10/21/20, dose adjusted due to significant skin toxicity -due to cancer progression, treatment has been changed to FOLFIRI and bevacizumab on 03/25/22, she is overall tolerating well -will repeat CT scan after next cycle chemo  -Restaging CT scan from 06/16/2022 showed stable to mild decrease in size of treated liver metastasis. No new lesions -She is tolerating chemotherapy well overall, will continue -Her restaging CT scan on August 31, 2022 showed stable liver metastasis, and a stable 7 mm left lung nodule, indeterminate.  I personally reviewed the scan images with patient -Will continue current therapy.  We discussed the option of maintenance therapy with irinotecan and bevacizumab in future.  She is tolerating current  FOLFIRI and bevacizumab well, will continue. -Restaging CT 03/24/2023 showed stable disease.   Assessment & Plan Metastatic colon cancer Undergoing treatment for metastatic colon cancer with mild anemia (hemoglobin 11.7). Reports one episode of vomiting, resolved spontaneously. Skin darkening and neuropathy present, with no changes in neuropathy symptoms. Constipation likely due to nausea medication associated with chemotherapy. Blood pressure remains well-controlled despite potential for treatment-induced hypertension. - Order follow-up scan - Schedule next chemotherapy infusion for April 23rd, with potential adjustment based on scan results - Monitor hemoglobin levels - Advise on managing constipation related to nausea medication - Monitor blood pressure  HTN and DM -Continue medication and follow-up with PCP  Plan -Lab reviewed, adequate for treatment, will proceed chemo today at the same dose -Follow-up in 2 weeks before next cycle chemo -CT scan before next visit or in 3 weeks     SUMMARY OF ONCOLOGIC HISTORY: Oncology History  metastatic colon cancer to liver  06/20/2019 Procedure   Colonoscopy by Dr Meridee Score 06/20/19  IMPRESSION -Seven 3 to 10 mm polyps in the sigmoid colon, in the transverse colon and in the escending colon, removed with a cold snare. Resected and retrireved.  -One 20mm polyp in the descending colon. Biopsies. Tattoes.  -Mediaum sized lipoma in the ascending colon.   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 See Comment B. Colon, Ascending, polyp, Polypectomy:  -TUBULAR ADENOMA -No high grade dysplasia or malignancy.  C. Colon, TRansverse, Polyo, polectomy:  -TUBULAR ADENOMA -No high grade dysplasia or malignancy.  D. Colon, Sigmoid, Polyp, Polypectomy:  -HYPERPLASTIC POLYP   11/07/2019 Imaging   US Abdomen 11/07/19  IMPRESSION: 1. Two solid masses in the liver  are  nonspecific. Recommend MRI abdomen with and without contrast for further evaluation.   12/15/2019 Imaging   MRI Abdomen 12/15/19  IMPRESSION: 1. There are two large masses in the liver with appearance favoring metastatic disease or hepatocellular carcinoma or cholangiocarcinoma. A benign etiology is highly unlikely given the enhancement pattern and associated adenopathy. 2. Considerable porta hepatis and retroperitoneal adenopathy. Some of the confluent porta hepatis tumor is potentially infiltrative and abuts the pancreatic body along its right upper margin, making it difficult to completely exclude the possibility of pancreatic adenocarcinoma primary. Possibilities helpful in further workup might include tissue diagnosis, endoscopic ultrasound, or nuclear medicine PET-CT. 3. Pancreas divisum. 4. Lumbar spondylosis and degenerative disc disease. 5. Despite efforts by the technologist and patient, motion artifact is present on today's exam and could not be eliminated. This reduces exam sensitivity and specificity.   12/22/2019 Procedure   Upper Endoscopy by Dr Elnoria Howard 12/22/19  IMPRESSION - One lymph node was visualized and measured in the porta hepatis region. Fine needle aspiration performed    12/22/2019 Initial Biopsy   A. LIVER, PORTA HEPATIS MASS, FINE NEEDLE 12/22/19 ASPIRATION:  FINAL MICROSCOPIC DIAGNOSIS:  - Malignant cells consistent with metastatic adenocarcinoma   01/01/2020 Initial Diagnosis   Intrahepatic cholangiocarcinoma (HCC)   01/08/2020 Initial Biopsy   FINAL MICROSCOPIC DIAGNOSIS:   A. LIVER, LEFT LOBE, BIOPSY:  - Metastatic adenocarcinoma, consistent with a colorectal primary.  See  comment      COMMENT:   Immunohistochemical stains show the tumor cells are positive for CK20  and CDX2 but negative for CK7, consistent with above interpretation.  Dr. Mosetta Putt was notified on 01/10/2020   01/08/2020 Genetic Testing   [name]  [name] wildtype and  [name]/[name] mutations which make her eligible for target biological agent Vectibix.   01/24/2020 Procedure   PAC placed 01/24/20   01/25/2020 -  Chemotherapy   first line FOLFOX starting 01/25/2020, Vextibix  added with C2 (02/06/20)   02/06/2020 - 02/06/2022 Chemotherapy   Patient is on Treatment Plan : COLORECTAL Panitumumab q14d ([name] Wild - Type Gene Only)     06/20/2020 Imaging   CT CAP  IMPRESSION: 1. Continued interval reduction in size and conspicuity of a subsolid nodule of the peripheral left upper lobe. 2. Unchanged prominent pretracheal and subcarinal lymph nodes. 3. Redemonstrated partially calcified low-attenuation liver masses, slightly decreased in size compared to prior examination. 4. Slight interval decrease in size of a portacaval lymph node or conglomerate and retroperitoneal lymph nodes. 5. Findings are consistent with continued treatment response of nodal, pulmonary, and hepatic metastatic disease. 6. Coronary artery disease.   Aortic Atherosclerosis (ICD10-I70.0).     10/09/2020 Imaging   IMPRESSION: 1. Slight decrease in size of dominant liver mass with smaller liver mass within 1-2 mm of prior measurement. 2. Stable appearance of celiac lymph and retroperitoneal lymph nodes. Dominant node with calcification in the gastrohepatic ligament as described. 3. Continued decrease in size of LEFT upper lobe nodule. 4. Three-vessel coronary artery calcification. 5. Aortic atherosclerosis.   03/25/2022 -  Chemotherapy   Patient is on Treatment Plan : COLORECTAL FOLFIRI + Bevacizumab q14d     08/31/2022 Imaging    IMPRESSION: 1. Unchanged, densely calcified liver lesions, consistent with treated metastatic disease. 2. Unchanged, irregular subpleural opacity of the peripheral left upper lobe. 3. Unchanged subcentimeter gastrohepatic ligament lymph node. 4. No evidence of new metastatic disease in the chest, abdomen, or pelvis. 5. Status post hysterectomy. 6.  Coronary artery disease. 7. Aortic  valve calcifications. Correlate for echocardiographic evidence of aortic valve dysfunction.        Discussed the use of AI scribe software for clinical note transcription with the patient, who gave verbal consent to proceed.  History of Present Illness The patient, a 68 year old with metastatic colon cancer, presents for a routine follow-up. She reports overall stability in her condition, with no significant issues during her last treatment. She experienced a single episode of vomiting, which resolved spontaneously. She also reports a slight weight loss of three pounds, which she attributes to dietary changes related to her cardiac health.  The patient also reports a change in skin coloration, particularly on her feet, which she attributes to her chemotherapy treatment. She is managing this with regular application of Vaseline. She also reports no changes in her neuropathy symptoms and is able to function at home. She compares her current chemotherapy treatment to previous ones, noting similar levels of difficulty, but with an increase in constipation, particularly when she has the pump on.  In addition to her cancer, the patient is also managing a cardiac condition, for which she is taking aspirin and clopidogrel. She reports no issues with bruising or bleeding.     All other systems were reviewed with the patient and are negative.  MEDICAL HISTORY:  Past Medical History:  Diagnosis Date   Arthritis    Diabetes mellitus without complication (HCC)    Hypertension    met colon ca to liver 01/2020    SURGICAL HISTORY: Past Surgical History:  Procedure Laterality Date   ABDOMINAL HYSTERECTOMY     ANTERIOR AND POSTERIOR REPAIR WITH SACROSPINOUS FIXATION N/A 07/16/2016   Procedure: ANTERIOR AND POSTERIOR REPAIR WITH SACROSPINOUS FIXATION;  Surgeon: Harold Hedge, MD;  Location: [facility];  Service: Gynecology;  Laterality: N/A;   CYSTOSCOPY  07/16/2016    Procedure: CYSTOSCOPY;  Surgeon: Harold Hedge, MD;  Location: [facility];  Service: Gynecology;;   ESOPHAGOGASTRODUODENOSCOPY (EGD) WITH PROPOFOL N/A 12/22/2019   Procedure: ESOPHAGOGASTRODUODENOSCOPY (EGD) WITH PROPOFOL;  Surgeon: Jeani Hawking, MD;  Location: [facility];  Service: Endoscopy;  Laterality: N/A;   FINE NEEDLE ASPIRATION N/A 12/22/2019   Procedure: FINE NEEDLE ASPIRATION (FNA) LINEAR;  Surgeon: Jeani Hawking, MD;  Location: [facility];  Service: Endoscopy;  Laterality: N/A;   IR IMAGING GUIDED PORT INSERTION  01/24/2020   LAPAROSCOPIC VAGINAL HYSTERECTOMY WITH SALPINGECTOMY Bilateral 07/16/2016   Procedure: LAPAROSCOPIC ASSISTED VAGINAL HYSTERECTOMY WITH SALPINGECTOMY;  Surgeon: Harold Hedge, MD;  Location: [facility];  Service: Gynecology;  Laterality: Bilateral;   LEFT HEART CATH AND CORONARY ANGIOGRAPHY N/A 05/07/2023   Procedure: LEFT HEART CATH AND CORONARY ANGIOGRAPHY;  Surgeon: Marykay Lex, MD;  Location: [facility] INVASIVE CV LAB;  Service: Cardiovascular;  Laterality: N/A;   MYOMECTOMY ABDOMINAL APPROACH     THYROID SURGERY     tyroid     UPPER ESOPHAGEAL ENDOSCOPIC ULTRASOUND (EUS) N/A 12/22/2019   Procedure: UPPER ESOPHAGEAL ENDOSCOPIC ULTRASOUND (EUS);  Surgeon: Jeani Hawking, MD;  Location: [facility];  Service: Endoscopy;  Laterality: N/A;    I have reviewed the social history and family history with the patient and they are unchanged from previous note.  ALLERGIES:  is allergic to oxaliplatin.  MEDICATIONS:  Current Outpatient Medications  Medication Sig Dispense Refill   aspirin EC 81 MG tablet Take 1 tablet (81 mg total) by mouth daily. Swallow whole. 90 tablet 0   atorvastatin (LIPITOR) 80 MG tablet Take 1 tablet (80 mg total) by mouth daily. 90 tablet 0  clopidogrel (PLAVIX) 75 MG tablet Take 1 tablet (75 mg total) by mouth daily. 90 tablet 0   metoprolol tartrate (LOPRESSOR) 25 MG tablet Take 0.5 tablets (12.5 mg total) by mouth 2 (two) times daily. 30  tablet 2   pantoprazole (PROTONIX) 20 MG tablet Take 1 tablet (20 mg total) by mouth daily. 30 tablet 1   No current facility-administered medications for this visit.    PHYSICAL EXAMINATION: ECOG PERFORMANCE STATUS: 1 - Symptomatic but completely ambulatory  Vitals:   06/16/23 1013  BP: 118/64  Pulse: (!) 55  Resp: (!) 21  Temp: 97.7 F (36.5 C)  SpO2: 98%   Wt Readings from Last 3 Encounters:  06/16/23 174 lb 4.8 oz (79.1 kg)  06/02/23 177 lb (80.3 kg)  05/24/23 177 lb 3.2 oz (80.4 kg)     GENERAL:alert, no distress and comfortable SKIN: skin color, texture, turgor are normal, no rashes or significant lesions except darker skin and dryness in bilateral hands EYES: normal, Conjunctiva are pink and non-injected, sclera clear NECK: supple, thyroid normal size, non-tender, without nodularity LYMPH:  no palpable lymphadenopathy in the cervical, axillary  LUNGS: clear to auscultation and percussion with normal breathing effort HEART: regular rate & rhythm and no murmurs and no lower extremity edema ABDOMEN:abdomen soft, non-tender and normal bowel sounds Musculoskeletal:no cyanosis of digits and no clubbing  NEURO: alert & oriented x 3 with fluent speech, no focal motor/sensory deficits  Physical Exam   LABORATORY DATA:  I have reviewed the data as listed    Latest Ref Rng & Units 06/16/2023    9:51 AM 06/02/2023    9:34 AM 05/07/2023    4:48 AM  CBC  WBC 4.0 - 10.5 K/uL 11.4  6.2  11.1   Hemoglobin 12.0 - 15.0 g/dL 16.1  09.6  04.5   Hematocrit 36.0 - 46.0 % 36.9  36.8  35.7   Platelets 150 - 400 K/uL 274  236  272         Latest Ref Rng & Units 06/16/2023    9:51 AM 06/02/2023    9:34 AM 05/06/2023    3:43 AM  CMP  Glucose 70 - 99 mg/dL 409  811  914   BUN 8 - 23 mg/dL 7  8  17    Creatinine 0.44 - 1.00 mg/dL 7.82  9.56  2.13   Sodium 135 - 145 mmol/L 141  139  133   Potassium 3.5 - 5.1 mmol/L 3.8  3.6  4.0   Chloride 98 - 111 mmol/L 103  104  102   CO2 22 - 32  mmol/L 30  29  22    Calcium 8.9 - 10.3 mg/dL 9.7  9.4  9.2   Total Protein 6.5 - 8.1 g/dL 7.6  7.4    Total Bilirubin 0.0 - 1.2 mg/dL 0.3  0.3    Alkaline Phos 38 - 126 U/L 135  127    AST 15 - 41 U/L 13  22    ALT 0 - 44 U/L 13  25        RADIOGRAPHIC STUDIES: I have personally reviewed the radiological images as listed and agreed with the findings in the report. No results found.    Orders Placed This Encounter  Procedures   CT CHEST ABDOMEN PELVIS W CONTRAST    Standing Status:   Future    Expected Date:   06/18/2023    Expiration Date:   06/15/2024    If indicated for the ordered  procedure, I authorize the administration of contrast media per Radiology protocol:   Yes    Does the patient have a contrast media/X-ray dye allergy?:   No    Preferred imaging location?:   [facility]    If indicated for the ordered procedure, I authorize the administration of oral contrast media per Radiology protocol:   Yes   All questions were answered. The patient knows to call the clinic with any problems, questions or concerns. No barriers to learning was detected. The total time spent in the appointment was 25 minutes.     Malachy Mood, MD 06/16/2023

## 2023-06-17 ENCOUNTER — Telehealth: Payer: Self-pay

## 2023-06-17 ENCOUNTER — Other Ambulatory Visit: Payer: Self-pay

## 2023-06-17 NOTE — Telephone Encounter (Signed)
 Reached out to the patient via telephone call per Lacie Burton,NP. Unable to reach the patient~telephone went straight to voicemail. Left patient voice message letting her know that she has a CT scan scheduled: 06/22/23 and that she needs to arrive at 3:30p to drink the contrast for the scan at 5:30p.

## 2023-06-17 NOTE — Telephone Encounter (Signed)
 Reached out to patient via telephone call to make her aware of the scheduled CT scan~06/22/23. Let patient know to arrive at 3:30p. Patient voiced understanding.

## 2023-06-18 ENCOUNTER — Inpatient Hospital Stay: Payer: Medicare (Managed Care)

## 2023-06-18 VITALS — BP 118/71 | HR 59 | Temp 98.4°F | Resp 16

## 2023-06-18 DIAGNOSIS — Z5111 Encounter for antineoplastic chemotherapy: Secondary | ICD-10-CM | POA: Diagnosis not present

## 2023-06-18 DIAGNOSIS — C221 Intrahepatic bile duct carcinoma: Secondary | ICD-10-CM

## 2023-06-18 MED ORDER — PEGFILGRASTIM-CBQV 6 MG/0.6ML ~~LOC~~ SOSY
6.0000 mg | PREFILLED_SYRINGE | Freq: Once | SUBCUTANEOUS | Status: AC
  Administered 2023-06-18: 6 mg via SUBCUTANEOUS
  Filled 2023-06-18: qty 0.6

## 2023-06-22 ENCOUNTER — Encounter (HOSPITAL_COMMUNITY): Payer: Self-pay

## 2023-06-22 ENCOUNTER — Ambulatory Visit (HOSPITAL_COMMUNITY)
Admission: RE | Admit: 2023-06-22 | Discharge: 2023-06-22 | Disposition: A | Discharge status: Home / Self Care | Payer: Medicare (Managed Care) | Source: Ambulatory Visit | Attending: Hematology | Admitting: Hematology

## 2023-06-22 DIAGNOSIS — C221 Intrahepatic bile duct carcinoma: Secondary | ICD-10-CM | POA: Diagnosis present

## 2023-06-22 MED ORDER — IOHEXOL 9 MG/ML PO SOLN
1000.0000 mL | ORAL | Status: AC
  Administered 2023-06-22: 1000 mL via ORAL

## 2023-06-22 MED ORDER — IOHEXOL 300 MG/ML  SOLN
100.0000 mL | Freq: Once | INTRAMUSCULAR | Status: AC | PRN
  Administered 2023-06-22: 100 mL via INTRAVENOUS

## 2023-06-22 MED ORDER — IOHEXOL 9 MG/ML PO SOLN
ORAL | Status: AC
  Filled 2023-06-22: qty 1000

## 2023-06-22 MED ORDER — HEPARIN SOD (PORK) LOCK FLUSH 100 UNIT/ML IV SOLN
500.0000 [IU] | Freq: Once | INTRAVENOUS | Status: AC
  Administered 2023-06-22: 500 [IU] via INTRAVENOUS

## 2023-06-29 ENCOUNTER — Other Ambulatory Visit: Payer: Self-pay | Admitting: Nurse Practitioner

## 2023-06-29 DIAGNOSIS — C221 Intrahepatic bile duct carcinoma: Secondary | ICD-10-CM

## 2023-06-29 NOTE — Assessment & Plan Note (Addendum)
 MSS, [name]/[name] wildtype -diagnosed 12/2019 by porta hepatis LN biopsy during EUS for work up of abdominal pain and liver lesions seen on [place]  and MRI. Liver biopsy 01/08/20 confirmed metastasis from primary colorectal cancer. PET scan showed hypermetabolism to known liver mets, diffuse thoracic and abdominal lymphadenopathy, a 1.8 cm LUL pulmonary nodule, and splenic flexure of colon. -treated with first line FOLFOX 01/25/20 - 10/02/20, Vectibix  added with C2. Oxali discontinued after C18 due to reaction. -switched to Xeloda  10/21/20, dose adjusted due to significant skin toxicity -due to cancer progression, treatment has been changed to FOLFIRI and bevacizumab  on 03/25/22, she is overall tolerating well -will repeat CT scan after next cycle chemo  -Restaging CT scan from 06/16/2022 showed stable to mild decrease in size of treated liver metastasis. No new lesions -She is tolerating chemotherapy well overall, will continue -Her restaging CT scan on August 31, 2022 showed stable liver metastasis, and a stable 7 mm left lung nodule, indeterminate.  I personally reviewed the scan images with patient -Will continue current therapy.  We discussed the option of maintenance therapy with irinotecan  and bevacizumab  in future.  She is tolerating current FOLFIRI and bevacizumab  well, will continue. -Restaging CT 03/24/2023 showed stable disease.  -Last chemotherapy treatment on 05/05/2023 resulted in ED visit and hospitalization due to coronary artery disease.  She underwent a left heart catheterization showing severe two-vessel disease with diffuse disease in the small vessels throughout with 90% ramus 1, 80% ramus 2, 75% proximal RCA, 50% mid RCA, and 40% proximal mid LAD stenoses, not amenable to PCI.   -Chemotherapy restarted on 06/02/2023.  Chemotherapy dosing to be lessened to reduce cardiovascular stress.  Bevacizumab  to be held.  -06/22/2023 - restaging CT CAP -showed interval enlargement of 2 liver lesions.  There is  stable nodule in the upper right lobe of the lungs.  No other evidence of recurrence or metastatic disease. --Will get guardant reveal test to evaluate for genetic mutations of tumor.  Plan to add Vectibix  based on these findings.  Continue FOLFIRI chemotherapy every 14 days.

## 2023-06-29 NOTE — Progress Notes (Unsigned)
 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 Liver biopsy 01/08/20 confirmed metastasis from primary colorectal cancer. PET scan showed hypermetabolism to known liver mets, diffuse thoracic and abdominal lymphadenopathy, a 1.8 cm LUL pulmonary nodule, and splenic flexure of colon. -treated with first line FOLFOX 01/25/20 - 10/02/20, Vectibix  added with C2. Oxali discontinued after C18 due to reaction. -switched to Xeloda  10/21/20, dose adjusted due to significant skin toxicity -due to cancer progression, treatment has been changed to FOLFIRI and bevacizumab  on 03/25/22, she is overall tolerating well -will repeat CT scan after next cycle chemo  -Restaging CT scan from 06/16/2022 showed stable to mild decrease in size of treated liver metastasis. No new lesions -She is tolerating chemotherapy well overall, will continue -Her restaging CT scan on August 31, 2022 showed stable liver metastasis, and a stable 7 mm left lung nodule, indeterminate.  I personally reviewed the scan images with patient -Will continue current therapy.  We discussed the option of maintenance therapy with irinotecan  and bevacizumab  in future.  She is tolerating current FOLFIRI and bevacizumab  well, will continue. -Restaging CT 03/24/2023 showed stable disease.  -Last chemotherapy treatment on 05/05/2023 resulted in ED visit and hospitalization due to coronary artery disease.  She underwent a left heart  catheterization showing severe two-vessel disease with diffuse disease in the small vessels throughout with 90% ramus 1, 80% ramus 2, 75% proximal RCA, 50% mid RCA, and 40% proximal mid LAD stenoses, not amenable to PCI.   -Chemotherapy restarted on 06/02/2023.  Chemotherapy dosing to be lessened to reduce cardiovascular stress.  Bevacizumab  to be held.  -06/22/2023 - restaging CT CAP -showed interval enlargement of 2 liver lesions.  There is stable nodule in the upper right lobe of the lungs.  No other evidence of recurrence or metastatic disease. --Will get guardant reveal test to evaluate for genetic mutations of tumor.  Plan to add Vectibix  based on these findings.  Continue FOLFIRI chemotherapy every 14 days.   Metastatic colon cancer  CT CAP showing slight interval increase in 2 liver lesions. Today, continue with FOLFIRI as scheduled.   Will get guardant reveal test to evaluate for genetic mutations of tumor. Will likely add Vectibix  based on results of guardant reveal test. Continue FOLFIRI chemotherapy.  CAD  Currently stable. -continue with plavix  and metoprolol  as prescribed  -follow up with cardiology as scheduled   Plan: Patient seen along with Dr. Maryalice Smaller today. Reviewed labs.  Mild and stable anemia.  Elevated WBC and ANC.  Last dose growth factor support on 06/18/2023. Reviewed CT CAP with the patient.  She has 2 liver lesions which have shown interval enlargement since last scan.  Plan to get guardant reveal test to evaluate for genetic mutations of tumor.  Adjust immunotherapy based on test results (likely change from bevacizumab  to Vectibix ). Proceed to second line chemotherapy FOLFIRI today.  Will continue every 2 weeks.   The patient understands the plans discussed today and is in agreement with them.  She knows to  contact our office if she develops concerns prior to her next appointment.  I provided 25 minutes of face-to-face time during this encounter and > 50% was spent  counseling as documented under my assessment and plan.    Sharyon Deis, NP  [facility] - A DEPT OF [facility] [address] [place] [address] Dept: [phone] Dept Fax: [phone]   No orders of the defined types were placed in this encounter.     CHIEF COMPLAINT:  CC: metastatic colon cancer   Current Treatment:  second lind chemotherapy FOLFIRI   INTERVAL HISTORY:  Lindsay [name] is here today for repeat clinical assessment. She was last seen by Dr. Maryalice Smaller on 06/16/2023. She had restaging CT CAP 06/22/2023.  This did not show interval increase in the liver lesions.  Lesion #1, previously measuring 5.4 x 4.1 cm, and is now measuring 5.7 x 4.5 cm in diameter.  Lesion #2, previously measuring 3.9 x 2.3 cm, is now measuring 4.8 x 2.3 cm in diameter.  There is a stable nodule in the upper right lobe of the lungs.  No other evidence of recurrence or metastatic disease on today.  Patient reports mild constipation first day or so after chemotherapy.  MiraLAX  does not work.  Takes milk of magnesia which is effective.  She does continue to have baseline peripheral neuropathy.  This is unchanged.  She continues to have intermittent gas in the upper abdomen.  Gas-X as helpful in relieving this symptom.  She does have some occasional chest discomfort.  She does see cardiologist and has appointment next month.  She denies chest pressure, or shortness of breath. She denies headaches or visual disturbances. She denies abdominal pain, nausea, vomiting, or changes in bowel or bladder habits.  She denies fevers or chills. She denies pain. Her appetite is fair. Her weight has been stable.  I have reviewed the past medical history, past surgical history, social history and family history with the patient and they are unchanged from previous note.  ALLERGIES:  is allergic to oxaliplatin .  MEDICATIONS:  Current Outpatient Medications   Medication Sig Dispense Refill   aspirin  EC 81 MG tablet Take 1 tablet (81 mg total) by mouth daily. Swallow whole. 90 tablet 0   atorvastatin  (LIPITOR) 80 MG tablet Take 1 tablet (80 mg total) by mouth daily. 90 tablet 0   clopidogrel  (PLAVIX ) 75 MG tablet Take 1 tablet (75 mg total) by mouth daily. 90 tablet 0   metoprolol  tartrate (LOPRESSOR ) 25 MG tablet Take 0.5 tablets (12.5 mg total) by mouth 2 (two) times daily. 30 tablet 2   pantoprazole  (PROTONIX ) 20 MG tablet Take 1 tablet (20 mg total) by mouth daily. 30 tablet 1   No current facility-administered medications for this visit.   Facility-Administered Medications Ordered in Other Visits  Medication Dose Route Frequency Provider Last Rate Last Admin   fluorouracil  (ADRUCIL ) 3,900 mg in sodium chloride  0.9 % 72 mL chemo infusion  2,000 mg/m2 (Treatment Plan Recorded) Intravenous 1 day or 1 dose Sonja Oakley, MD   Infusion Verify at 06/30/23 1339   heparin  lock flush 100 unit/mL  500 Units Intracatheter Once PRN Sonja Calais, MD       sodium chloride  flush (NS) 0.9 % injection 10 mL  10 mL Intracatheter PRN Sonja Grafton, MD        HISTORY OF PRESENT ILLNESS:   Oncology History  metastatic colon cancer to liver  06/20/2019 Procedure   Colonoscopy by Dr Brice Campi 06/20/19  IMPRESSION -Seven 3 to 10 mm polyps in the sigmoid colon, in the transverse colon and in the escending colon, removed with a cold snare. Resected and retrireved.  -One 20mm polyp in the descending colon. Biopsies. Tattoes.  -Mediaum sized lipoma in the ascending colon.   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 See Comment B. Colon, Ascending, polyp, Polypectomy:  -TUBULAR ADENOMA -No high grade dysplasia or malignancy.  C. Colon, TRansverse, Polyo, polectomy:  -TUBULAR ADENOMA -No high grade dysplasia or malignancy.  D. Colon, Sigmoid, Polyp, Polypectomy:  -HYPERPLASTIC POLYP    11/07/2019 Imaging   US  Abdomen 11/07/19  IMPRESSION: 1. Two solid masses in the liver are nonspecific. Recommend MRI abdomen with and without contrast for further evaluation.   12/15/2019 Imaging   MRI Abdomen 12/15/19  IMPRESSION: 1. There are two large masses in the liver with appearance favoring metastatic disease or hepatocellular carcinoma or cholangiocarcinoma. A benign etiology is highly unlikely given the enhancement pattern and associated adenopathy. 2. Considerable porta hepatis and retroperitoneal adenopathy. Some of the confluent porta hepatis tumor is potentially infiltrative and abuts the pancreatic body along its right upper margin, making it difficult to completely exclude the possibility of pancreatic adenocarcinoma primary. Possibilities helpful in further workup might include tissue diagnosis, endoscopic ultrasound, or nuclear medicine PET-CT. 3. Pancreas divisum. 4. Lumbar spondylosis and degenerative disc disease. 5. Despite efforts by the technologist and patient, motion artifact is present on today's exam and could not be eliminated. This reduces exam sensitivity and specificity.   12/22/2019 Procedure   Upper Endoscopy by Dr Nickey Barn 12/22/19  IMPRESSION - One lymph node was visualized and measured in the porta hepatis region. Fine needle aspiration performed    12/22/2019 Initial Biopsy   A. LIVER, PORTA HEPATIS MASS, FINE NEEDLE 12/22/19 ASPIRATION:  FINAL MICROSCOPIC DIAGNOSIS:  - Malignant cells consistent with metastatic adenocarcinoma   01/01/2020 Initial Diagnosis   Intrahepatic cholangiocarcinoma (HCC)   01/08/2020 Initial Biopsy   FINAL MICROSCOPIC DIAGNOSIS:   A. LIVER, LEFT LOBE, BIOPSY:  - Metastatic adenocarcinoma, consistent with a colorectal primary.  See  comment      COMMENT:   Immunohistochemical stains show the tumor cells are positive for CK20  and CDX2 but negative for CK7, consistent with above interpretation.  Dr. Maryalice Smaller  was notified on 01/10/2020   01/08/2020 Genetic Testing   [name]  [name] wildtype and [name]/[name] mutations which make her eligible for target biological agent Vectibix .   01/24/2020 Procedure   PAC placed 01/24/20   01/25/2020 -  Chemotherapy   first line FOLFOX starting 01/25/2020, Vextibix  added with C2 (02/06/20)   02/06/2020 - 02/06/2022 Chemotherapy   Patient is on Treatment Plan : COLORECTAL Panitumumab  q14d ([name] Wild - Type Gene Only)     06/20/2020 Imaging   CT CAP  IMPRESSION: 1. Continued interval reduction in size and conspicuity of a subsolid nodule of the peripheral left upper lobe. 2. Unchanged prominent pretracheal and subcarinal lymph nodes. 3. Redemonstrated partially calcified low-attenuation liver masses, slightly decreased in size compared to prior examination. 4. Slight interval decrease in size of a portacaval lymph node or conglomerate and retroperitoneal lymph nodes. 5. Findings are consistent with continued treatment response of nodal, pulmonary, and hepatic metastatic disease. 6. Coronary artery disease.   Aortic Atherosclerosis (ICD10-I70.0).     10/09/2020 Imaging   IMPRESSION: 1. Slight decrease in size of  dominant liver mass with smaller liver mass within 1-2 mm of prior measurement. 2. Stable appearance of celiac lymph and retroperitoneal lymph nodes. Dominant node with calcification in the gastrohepatic ligament as described. 3. Continued decrease in size of LEFT upper lobe nodule. 4. Three-vessel coronary artery calcification. 5. Aortic atherosclerosis.   03/25/2022 -  Chemotherapy   Patient is on Treatment Plan : COLORECTAL FOLFIRI + Bevacizumab  q14d     08/31/2022 Imaging    IMPRESSION: 1. Unchanged, densely calcified liver lesions, consistent with treated metastatic disease. 2. Unchanged, irregular subpleural opacity of the peripheral left upper lobe. 3. Unchanged subcentimeter gastrohepatic ligament lymph node. 4. No evidence  of new metastatic disease in the chest, abdomen, or pelvis. 5. Status post hysterectomy. 6. Coronary artery disease. 7. Aortic valve calcifications. Correlate for echocardiographic evidence of aortic valve dysfunction.         REVIEW OF SYSTEMS:   Constitutional: Denies fevers, chills or abnormal weight loss Eyes: Denies blurriness of vision Ears, nose, mouth, throat, and face: Denies mucositis or sore throat Respiratory: Denies cough, dyspnea or wheezes Cardiovascular: Denies palpitation, chest discomfort or lower extremity swelling Gastrointestinal:  Denies nausea, heartburn or change in bowel habits. Has some constipation on the day of chemotherapy. Will have to take Milk of Magnesia to relieve. Miralax  is ineffective.  Skin: Denies abnormal skin rashes Lymphatics: Denies new lymphadenopathy or easy bruising Neurological:Denies numbness, tingling or new weaknesses Behavioral/Psych: Mood is stable, no new changes  All other systems were reviewed with the patient and are negative.   VITALS:   Today's Vitals   06/30/23 1004  BP: 120/70  Pulse: (!) 50  Resp: 17  Temp: 97.8 F (36.6 C)  SpO2: 99%  Weight: 175 lb 3.2 oz (79.5 kg)  PainSc: 0-No pain   Body mass index is 28.28 kg/m.   Wt Readings from Last 3 Encounters:  06/30/23 175 lb 3.2 oz (79.5 kg)  06/16/23 174 lb 4.8 oz (79.1 kg)  06/02/23 177 lb (80.3 kg)    Body mass index is 28.28 kg/m.  Performance status (ECOG): 1 - Symptomatic but completely ambulatory  PHYSICAL EXAM:   GENERAL:alert, no distress and comfortable SKIN: skin color, texture, turgor are normal, no rashes or significant lesions EYES: normal, Conjunctiva are pink and non-injected, sclera clear OROPHARYNX:no exudate, no erythema and lips, buccal mucosa, and tongue normal  NECK: supple, thyroid normal size, non-tender, without nodularity LYMPH:  no palpable lymphadenopathy in the cervical, axillary or inguinal LUNGS: clear to auscultation  and percussion with normal breathing effort HEART: regular rate & rhythm and no murmurs and no lower extremity edema ABDOMEN:abdomen soft, non-tender and normal bowel sounds Musculoskeletal:no cyanosis of digits and no clubbing  NEURO: alert & oriented x 3 with fluent speech, no focal motor/sensory deficits  LABORATORY DATA:  I have reviewed the data as listed    Component Value Date/Time   NA 141 06/30/2023 0929   K 3.4 (L) 06/30/2023 0929   CL 106 06/30/2023 0929   CO2 30 06/30/2023 0929   GLUCOSE 131 (H) 06/30/2023 0929   BUN 8 06/30/2023 0929   CREATININE 0.63 06/30/2023 0929   CALCIUM  9.2 06/30/2023 0929   PROT 7.1 06/30/2023 0929   ALBUMIN 4.0 06/30/2023 0929   AST 13 (L) 06/30/2023 0929   ALT 14 06/30/2023 0929   ALKPHOS 168 (H) 06/30/2023 0929   BILITOT 0.3 06/30/2023 0929   GFRNONAA >60 06/30/2023 0929   GFRAA >60 07/09/2016 0836    Lab Results  Component  Value Date   WBC 17.3 (H) 06/30/2023   NEUTROABS 13.4 (H) 06/30/2023   HGB 11.5 (L) 06/30/2023   HCT 36.4 06/30/2023   MCV 89.4 06/30/2023   PLT 286 06/30/2023     RADIOGRAPHIC STUDIES:CT CHEST ABDOMEN PELVIS W CONTRAST Result Date: 06/30/2023 CLINICAL DATA:  Assess treatment response colon cancer. Intrahepatic cholangiocarcinoma. * Tracking Code: BO * EXAM: CT CHEST, ABDOMEN, AND PELVIS WITH CONTRAST TECHNIQUE: Multidetector CT imaging of the chest, abdomen and pelvis was performed following the standard protocol during bolus administration of intravenous contrast. RADIATION DOSE REDUCTION: This exam was performed according to the departmental dose-optimization program which includes automated exposure control, adjustment of the mA and/or kV according to patient size and/or use of iterative reconstruction technique. CONTRAST:  OMNIPAQUE  IOHEXOL  300 MG/ML  SOLN COMPARISON:  CT 03/24/2023 and older FINDINGS: CT CHEST FINDINGS Cardiovascular: Right IJ chest port is accessed. Tip of the catheter extends to the  right atrium. Heart is not enlarged. Trace pericardial fluid or thickening. Coronary artery calcifications are seen. Is also calcifications along the aortic valve. Bovine type aortic arch, normal variant. The thoracic aorta has some scattered partially calcified plaque Mediastinum/Nodes: Slightly patulous thoracic esophagus. Absent right thyroid lobe. There are some small less than 1 cm size in short axis mediastinal lymph nodes, not pathologic by size criteria and unchanged from previous. No abnormal hilar or axillary nodes. Lungs/Pleura: No consolidation, pneumothorax or effusion. Mild basilar atelectasis or scarring medially along the right lung base. Areas of atelectasis along the left lower lobe and dependent lingula are also noted with some areas of improvement. The branching nodular area peripherally in the posterior left upper lobe is again seen. Previous dimensions were 13 x 12 mm and today when measured in same fashion as the prior 13 x 12 mm on series 4, image 46. No new dominant lung nodule. Musculoskeletal: Mild degenerative changes in the spine once again with disc height loss, osteophytes and endplate sclerosis. Several osteophytes are bridging. Other areas of degenerative changes as well. CT ABDOMEN PELVIS FINDINGS Hepatobiliary: Liver lesions are again identified with some dystrophic central calcifications. Lesion seen in the dome liver in segment 4 measuring 5.4 x 4.1 cm previously, today measures 5.7 by 4.5 cm. Lesion seen segment 2 measuring 3.9 x 2.3 cm previously, today measures 4.8 by 2.3 cm, series 2, image 48. No new liver lesion identified. Gallbladder is nondilated. Patent portal vein. Pancreas: Unremarkable. No pancreatic ductal dilatation or surrounding inflammatory changes. Spleen: Normal in size without focal abnormality. Adrenals/Urinary Tract: Adrenal glands are unremarkable. Kidneys are normal, without renal calculi, focal lesion, or hydronephrosis. Bladder is dilated and there is  some anterior compartment prolapse. Stomach/Bowel: Stomach is nondilated. Small bowel is nondilated. There is some oral contrast in the stomach and small bowel. Large bowel has a normal course and caliber. Scattered colonic stool. Minimal diverticula. Vascular/Lymphatic: Normal caliber aorta and IVC. Scattered moderate partially calcified atherosclerotic plaque along the aorta and branch vessels. Scattered nodes in the upper retroperitoneum are again seen. Previous enlarged node along the portacaval space measuring 16 mm in short axis on the previous examination, today measures 14 mm short axis on series 2, image 59. Small nodes seen previously along the gastrohepatic ligament are again seen. Example measuring 7 mm along the gastrohepatic ligament on coronal series 5, image 64 is similar to minimally increased. Other nodes near the periceliac location are similar. Reproductive: Status post hysterectomy. No adnexal masses. Other: No free air or free fluid. Musculoskeletal: Scattered  degenerative changes of the spine and pelvis. Areas of disc bulging seen along lumbar spine with some stenosis. Right hip arthroplasty with social streak artifact. IMPRESSION: Centrally calcified liver lesions are again seen and slightly increased today. Prominent nodes in the upper retroperitoneum are overall similar. Attention on follow-up. Stable branching left upper lobe lung nodule. Electronically Signed   By: Adrianna Horde M.D.   On: 06/30/2023 09:47    Addendum I have seen the patient, examined her. I agree with the assessment and and plan and have edited the notes.   Asuna is clinically doing well.  I personally reviewed her restaging CT scan images, and discussed the findings with her.  Unfortunately she does have mild disease progression in liver, although is still in stable disease range by measurements.  I recommend [name] liquid biopsy, to see if she has targetable mutations.  If [name], [name] and BRAF are still  negative, I will change bevacizumab  to Vectibix , and continue FOLFIRI.  She agrees with the plan.  Sonja Maricopa MD 06/30/2023

## 2023-06-30 ENCOUNTER — Inpatient Hospital Stay (HOSPITAL_BASED_OUTPATIENT_CLINIC_OR_DEPARTMENT_OTHER): Payer: Medicare (Managed Care) | Admitting: Nurse Practitioner

## 2023-06-30 ENCOUNTER — Inpatient Hospital Stay: Payer: Medicare (Managed Care)

## 2023-06-30 VITALS — BP 120/70 | HR 50 | Temp 97.8°F | Resp 17 | Wt 175.2 lb

## 2023-06-30 DIAGNOSIS — C221 Intrahepatic bile duct carcinoma: Secondary | ICD-10-CM

## 2023-06-30 DIAGNOSIS — Z95828 Presence of other vascular implants and grafts: Secondary | ICD-10-CM

## 2023-06-30 DIAGNOSIS — Z5111 Encounter for antineoplastic chemotherapy: Secondary | ICD-10-CM | POA: Diagnosis not present

## 2023-06-30 LAB — CBC WITH DIFFERENTIAL (CANCER CENTER ONLY)
Abs Immature Granulocytes: 0.41 10*3/uL — ABNORMAL HIGH (ref 0.00–0.07)
Basophils Absolute: 0.1 10*3/uL (ref 0.0–0.1)
Basophils Relative: 1 %
Eosinophils Absolute: 0.2 10*3/uL (ref 0.0–0.5)
Eosinophils Relative: 1 %
HCT: 36.4 % (ref 36.0–46.0)
Hemoglobin: 11.5 g/dL — ABNORMAL LOW (ref 12.0–15.0)
Immature Granulocytes: 2 %
Lymphocytes Relative: 12 %
Lymphs Abs: 2.2 10*3/uL (ref 0.7–4.0)
MCH: 28.3 pg (ref 26.0–34.0)
MCHC: 31.6 g/dL (ref 30.0–36.0)
MCV: 89.4 fL (ref 80.0–100.0)
Monocytes Absolute: 1 10*3/uL (ref 0.1–1.0)
Monocytes Relative: 6 %
Neutro Abs: 13.4 10*3/uL — ABNORMAL HIGH (ref 1.7–7.7)
Neutrophils Relative %: 78 %
Platelet Count: 286 10*3/uL (ref 150–400)
RBC: 4.07 MIL/uL (ref 3.87–5.11)
RDW: 16.2 % — ABNORMAL HIGH (ref 11.5–15.5)
WBC Count: 17.3 10*3/uL — ABNORMAL HIGH (ref 4.0–10.5)
nRBC: 0 % (ref 0.0–0.2)

## 2023-06-30 LAB — CMP (CANCER CENTER ONLY)
ALT: 14 U/L (ref 0–44)
AST: 13 U/L — ABNORMAL LOW (ref 15–41)
Albumin: 4 g/dL (ref 3.5–5.0)
Alkaline Phosphatase: 168 U/L — ABNORMAL HIGH (ref 38–126)
Anion gap: 5 (ref 5–15)
BUN: 8 mg/dL (ref 8–23)
CO2: 30 mmol/L (ref 22–32)
Calcium: 9.2 mg/dL (ref 8.9–10.3)
Chloride: 106 mmol/L (ref 98–111)
Creatinine: 0.63 mg/dL (ref 0.44–1.00)
GFR, Estimated: >60 mL/min (ref >60)
Glucose, Bld: 131 mg/dL — ABNORMAL HIGH (ref 70–99)
Potassium: 3.4 mmol/L — ABNORMAL LOW (ref 3.5–5.1)
Sodium: 141 mmol/L (ref 135–145)
Total Bilirubin: 0.3 mg/dL (ref 0.0–1.2)
Total Protein: 7.1 g/dL (ref 6.5–8.1)

## 2023-06-30 LAB — TOTAL PROTEIN, URINE DIPSTICK: Protein, ur: NEGATIVE mg/dL

## 2023-06-30 MED ORDER — DEXAMETHASONE SODIUM PHOSPHATE 10 MG/ML IJ SOLN
10.0000 mg | Freq: Once | INTRAMUSCULAR | Status: AC
  Administered 2023-06-30: 10 mg via INTRAVENOUS
  Filled 2023-06-30: qty 1

## 2023-06-30 MED ORDER — SODIUM CHLORIDE 0.9 % IV SOLN
400.0000 mg/m2 | Freq: Once | INTRAVENOUS | Status: AC
  Administered 2023-06-30: 780 mg via INTRAVENOUS
  Filled 2023-06-30: qty 25

## 2023-06-30 MED ORDER — SODIUM CHLORIDE 0.9 % IV SOLN: Freq: Once | INTRAVENOUS | Status: AC

## 2023-06-30 MED ORDER — SODIUM CHLORIDE 0.9 % IV SOLN
150.0000 mg/m2 | Freq: Once | INTRAVENOUS | Status: AC
  Administered 2023-06-30: 300 mg via INTRAVENOUS
  Filled 2023-06-30: qty 15

## 2023-06-30 MED ORDER — SODIUM CHLORIDE 0.9% FLUSH: 10.0000 mL | INTRAVENOUS | Status: DC | PRN

## 2023-06-30 MED ORDER — HEPARIN SOD (PORK) LOCK FLUSH 100 UNIT/ML IV SOLN: 500.0000 [IU] | Freq: Once | INTRAVENOUS | Status: DC | PRN

## 2023-06-30 MED ORDER — SODIUM CHLORIDE 0.9 % IV SOLN
2000.0000 mg/m2 | INTRAVENOUS | Status: DC
  Administered 2023-06-30: 3900 mg via INTRAVENOUS
  Filled 2023-06-30: qty 78

## 2023-06-30 MED ORDER — ATROPINE SULFATE 1 MG/ML IV SOLN
0.5000 mg | Freq: Once | INTRAVENOUS | Status: AC | PRN
  Administered 2023-06-30: 0.5 mg via INTRAVENOUS
  Filled 2023-06-30: qty 1

## 2023-06-30 MED ORDER — SODIUM CHLORIDE 0.9% FLUSH
10.0000 mL | Freq: Once | INTRAVENOUS | Status: AC
  Administered 2023-06-30: 10 mL

## 2023-06-30 MED ORDER — PALONOSETRON HCL INJECTION 0.25 MG/5ML
0.2500 mg | Freq: Once | INTRAVENOUS | Status: AC
Start: 2023-06-30 — End: 2023-06-30
  Administered 2023-06-30: 0.25 mg via INTRAVENOUS
  Filled 2023-06-30: qty 5

## 2023-06-30 NOTE — Patient Instructions (Signed)
 [facility] - A DEPT OF [facility]  Discharge Instructions: Thank you for choosing [facility] to provide your oncology and hematology care.   If you have a lab appointment with the Cancer Center, please go directly to the Cancer Center and check in at the registration area.   Wear comfortable clothing and clothing appropriate for easy access to any Portacath or PICC line.   We strive to give you quality time with your provider. You may need to reschedule your appointment if you arrive late (15 or more minutes).  Arriving late affects you and other patients whose appointments are after yours.  Also, if you miss three or more appointments without notifying the office, you may be dismissed from the clinic at the provider's discretion.      For prescription refill requests, have your pharmacy contact our office and allow 72 hours for refills to be completed.    Today you received the following chemotherapy and/or immunotherapy agents: irinotecan , leucovorin , fluorouracil       To help prevent nausea and vomiting after your treatment, we encourage you to take your nausea medication as directed.  BELOW ARE SYMPTOMS THAT SHOULD BE REPORTED IMMEDIATELY: *FEVER GREATER THAN 100.4 F (38 C) OR HIGHER *CHILLS OR SWEATING *NAUSEA AND VOMITING THAT IS NOT CONTROLLED WITH YOUR NAUSEA MEDICATION *UNUSUAL SHORTNESS OF BREATH *UNUSUAL BRUISING OR BLEEDING *URINARY PROBLEMS (pain or burning when urinating, or frequent urination) *BOWEL PROBLEMS (unusual diarrhea, constipation, pain near the anus) TENDERNESS IN MOUTH AND THROAT WITH OR WITHOUT PRESENCE OF ULCERS (sore throat, sores in mouth, or a toothache) UNUSUAL RASH, SWELLING OR PAIN  UNUSUAL VAGINAL DISCHARGE OR ITCHING   Items with * indicate a potential emergency and should be followed up as soon as possible or go to the Emergency Department if any problems should occur.  Please show the CHEMOTHERAPY  ALERT CARD or IMMUNOTHERAPY ALERT CARD at check-in to the Emergency Department and triage nurse.  Should you have questions after your visit or need to cancel or reschedule your appointment, please contact [facility]  Dept: [phone]  and follow the prompts.  Office hours are 8:00 a.m. to 4:30 p.m. Monday - Friday. Please note that voicemails left after 4:00 p.m. may not be returned until the following business day.  We are closed weekends and major holidays. You have access to a nurse at all times for urgent questions. Please call the main number to the clinic Dept: [phone] and follow the prompts.   For any non-urgent questions, you may also contact your provider using MyChart. We now offer e-Visits for anyone 46 and older to request care online for non-urgent symptoms. For details visit [email or web address].   Also download the MyChart app! Go to the app store, search "MyChart", open the app, select [place], and log in with your MyChart username and password.

## 2023-07-01 ENCOUNTER — Other Ambulatory Visit: Payer: Self-pay

## 2023-07-01 DIAGNOSIS — C189 Malignant neoplasm of colon, unspecified: Secondary | ICD-10-CM

## 2023-07-01 DIAGNOSIS — C221 Intrahepatic bile duct carcinoma: Secondary | ICD-10-CM

## 2023-07-02 ENCOUNTER — Inpatient Hospital Stay: Payer: Medicare (Managed Care)

## 2023-07-02 VITALS — BP 122/78 | HR 55 | Temp 97.9°F | Resp 17

## 2023-07-02 DIAGNOSIS — C189 Malignant neoplasm of colon, unspecified: Secondary | ICD-10-CM

## 2023-07-02 DIAGNOSIS — Z5111 Encounter for antineoplastic chemotherapy: Secondary | ICD-10-CM | POA: Diagnosis not present

## 2023-07-02 DIAGNOSIS — C221 Intrahepatic bile duct carcinoma: Secondary | ICD-10-CM

## 2023-07-02 DIAGNOSIS — Z95828 Presence of other vascular implants and grafts: Secondary | ICD-10-CM

## 2023-07-02 LAB — MISCELLANEOUS TEST

## 2023-07-02 LAB — CEA (ACCESS): CEA (CHCC): 90.55 ng/mL — ABNORMAL HIGH (ref 0.00–5.00)

## 2023-07-02 MED ORDER — PEGFILGRASTIM-CBQV 6 MG/0.6ML ~~LOC~~ SOSY
6.0000 mg | PREFILLED_SYRINGE | Freq: Once | SUBCUTANEOUS | Status: AC
Start: 2023-07-02 — End: 2023-07-02
  Administered 2023-07-02: 6 mg via SUBCUTANEOUS
  Filled 2023-07-02: qty 0.6

## 2023-07-02 MED ORDER — SODIUM CHLORIDE 0.9% FLUSH
10.0000 mL | Freq: Once | INTRAVENOUS | Status: AC
Start: 2023-07-02 — End: 2023-07-02
  Administered 2023-07-02: 10 mL

## 2023-07-02 MED ORDER — HEPARIN SOD (PORK) LOCK FLUSH 100 UNIT/ML IV SOLN
500.0000 [IU] | Freq: Once | INTRAVENOUS | Status: AC
  Administered 2023-07-02: 500 [IU]

## 2023-07-05 NOTE — Progress Notes (Signed)
 Late Entry:  On 07/01/2023 FO Liquid CDX w/HER2 ordered.  Blood lab drawn on 07/02/2023. FO Requisition#[id number].

## 2023-07-10 LAB — HEPATIC FUNCTION PANEL
ALT: 14 IU/L (ref 0–32)
AST: 14 IU/L (ref 0–40)
Albumin: 4.2 g/dL (ref 3.9–4.9)
Alkaline Phosphatase: 249 IU/L — ABNORMAL HIGH (ref 44–121)
Bilirubin Total: <0.2 mg/dL (ref 0.0–1.2)
Bilirubin, Direct: 0.08 mg/dL (ref 0.00–0.40)
Total Protein: 7.2 g/dL (ref 6.0–8.5)

## 2023-07-10 LAB — LIPID PANEL
Chol/HDL Ratio: 2.8 ratio (ref 0.0–4.4)
Cholesterol, Total: 130 mg/dL (ref 100–199)
HDL: 46 mg/dL (ref >39)
LDL Chol Calc (NIH): 68 mg/dL (ref 0–99)
Triglycerides: 84 mg/dL (ref 0–149)
VLDL Cholesterol Cal: 16 mg/dL (ref 5–40)

## 2023-07-12 ENCOUNTER — Telehealth (HOSPITAL_BASED_OUTPATIENT_CLINIC_OR_DEPARTMENT_OTHER): Payer: Self-pay

## 2023-07-12 ENCOUNTER — Encounter (HOSPITAL_COMMUNITY): Payer: Self-pay | Admitting: Hematology

## 2023-07-12 NOTE — Telephone Encounter (Signed)
 Spoke with pt. Pt was notified of lab results. Pt advised to f/u with PCP regarding elevated alkaline phosphatase level.

## 2023-07-14 ENCOUNTER — Encounter (HOSPITAL_COMMUNITY): Payer: Self-pay | Admitting: Hematology

## 2023-07-14 ENCOUNTER — Encounter: Payer: Self-pay | Admitting: Nurse Practitioner

## 2023-07-14 ENCOUNTER — Inpatient Hospital Stay (HOSPITAL_BASED_OUTPATIENT_CLINIC_OR_DEPARTMENT_OTHER): Payer: Medicare (Managed Care) | Admitting: Nurse Practitioner

## 2023-07-14 ENCOUNTER — Inpatient Hospital Stay: Payer: Medicare (Managed Care)

## 2023-07-14 ENCOUNTER — Encounter: Payer: Self-pay | Admitting: Hematology

## 2023-07-14 ENCOUNTER — Inpatient Hospital Stay: Payer: Medicare (Managed Care) | Attending: Hematology

## 2023-07-14 ENCOUNTER — Other Ambulatory Visit: Payer: Self-pay

## 2023-07-14 VITALS — BP 134/74 | HR 62 | Temp 97.4°F | Resp 18 | Ht 66.0 in | Wt 171.6 lb

## 2023-07-14 DIAGNOSIS — C19 Malignant neoplasm of rectosigmoid junction: Secondary | ICD-10-CM | POA: Diagnosis present

## 2023-07-14 DIAGNOSIS — Z79899 Other long term (current) drug therapy: Secondary | ICD-10-CM | POA: Insufficient documentation

## 2023-07-14 DIAGNOSIS — C189 Malignant neoplasm of colon, unspecified: Secondary | ICD-10-CM

## 2023-07-14 DIAGNOSIS — Z90710 Acquired absence of both cervix and uterus: Secondary | ICD-10-CM | POA: Diagnosis not present

## 2023-07-14 DIAGNOSIS — C7800 Secondary malignant neoplasm of unspecified lung: Secondary | ICD-10-CM | POA: Insufficient documentation

## 2023-07-14 DIAGNOSIS — Z5189 Encounter for other specified aftercare: Secondary | ICD-10-CM | POA: Insufficient documentation

## 2023-07-14 DIAGNOSIS — C221 Intrahepatic bile duct carcinoma: Secondary | ICD-10-CM

## 2023-07-14 DIAGNOSIS — Z95828 Presence of other vascular implants and grafts: Secondary | ICD-10-CM

## 2023-07-14 DIAGNOSIS — T451X5D Adverse effect of antineoplastic and immunosuppressive drugs, subsequent encounter: Secondary | ICD-10-CM | POA: Diagnosis not present

## 2023-07-14 DIAGNOSIS — Z5111 Encounter for antineoplastic chemotherapy: Secondary | ICD-10-CM | POA: Insufficient documentation

## 2023-07-14 DIAGNOSIS — G620 Drug-induced polyneuropathy: Secondary | ICD-10-CM | POA: Diagnosis not present

## 2023-07-14 DIAGNOSIS — C787 Secondary malignant neoplasm of liver and intrahepatic bile duct: Secondary | ICD-10-CM | POA: Insufficient documentation

## 2023-07-14 DIAGNOSIS — K5900 Constipation, unspecified: Secondary | ICD-10-CM | POA: Insufficient documentation

## 2023-07-14 LAB — CMP (CANCER CENTER ONLY)
ALT: 15 U/L (ref 0–44)
AST: 18 U/L (ref 15–41)
Albumin: 4.1 g/dL (ref 3.5–5.0)
Alkaline Phosphatase: 181 U/L — ABNORMAL HIGH (ref 38–126)
Anion gap: 8 (ref 5–15)
BUN: 8 mg/dL (ref 8–23)
CO2: 29 mmol/L (ref 22–32)
Calcium: 9.4 mg/dL (ref 8.9–10.3)
Chloride: 103 mmol/L (ref 98–111)
Creatinine: 0.62 mg/dL (ref 0.44–1.00)
GFR, Estimated: >60 mL/min (ref >60)
Glucose, Bld: 115 mg/dL — ABNORMAL HIGH (ref 70–99)
Potassium: 3.7 mmol/L (ref 3.5–5.1)
Sodium: 140 mmol/L (ref 135–145)
Total Bilirubin: 0.3 mg/dL (ref 0.0–1.2)
Total Protein: 7.3 g/dL (ref 6.5–8.1)

## 2023-07-14 LAB — CBC WITH DIFFERENTIAL (CANCER CENTER ONLY)
Abs Immature Granulocytes: 0.06 10*3/uL (ref 0.00–0.07)
Basophils Absolute: 0.1 10*3/uL (ref 0.0–0.1)
Basophils Relative: 1 %
Eosinophils Absolute: 0.3 10*3/uL (ref 0.0–0.5)
Eosinophils Relative: 3 %
HCT: 35.5 % — ABNORMAL LOW (ref 36.0–46.0)
Hemoglobin: 11.7 g/dL — ABNORMAL LOW (ref 12.0–15.0)
Immature Granulocytes: 1 %
Lymphocytes Relative: 15 %
Lymphs Abs: 1.8 10*3/uL (ref 0.7–4.0)
MCH: 28.6 pg (ref 26.0–34.0)
MCHC: 33 g/dL (ref 30.0–36.0)
MCV: 86.8 fL (ref 80.0–100.0)
Monocytes Absolute: 0.9 10*3/uL (ref 0.1–1.0)
Monocytes Relative: 8 %
Neutro Abs: 8.9 10*3/uL — ABNORMAL HIGH (ref 1.7–7.7)
Neutrophils Relative %: 72 %
Platelet Count: 293 10*3/uL (ref 150–400)
RBC: 4.09 MIL/uL (ref 3.87–5.11)
RDW: 16.7 % — ABNORMAL HIGH (ref 11.5–15.5)
WBC Count: 12 10*3/uL — ABNORMAL HIGH (ref 4.0–10.5)
nRBC: 0 % (ref 0.0–0.2)

## 2023-07-14 LAB — TOTAL PROTEIN, URINE DIPSTICK: Protein, ur: NEGATIVE mg/dL

## 2023-07-14 MED ORDER — SODIUM CHLORIDE 0.9% FLUSH
10.0000 mL | Freq: Once | INTRAVENOUS | Status: AC
Start: 2023-07-14 — End: 2023-07-14
  Administered 2023-07-14: 10 mL

## 2023-07-14 MED ORDER — SODIUM CHLORIDE 0.9 % IV SOLN
150.0000 mg/m2 | Freq: Once | INTRAVENOUS | Status: AC
  Administered 2023-07-14: 300 mg via INTRAVENOUS
  Filled 2023-07-14: qty 15

## 2023-07-14 MED ORDER — SODIUM CHLORIDE 0.9 % IV SOLN
400.0000 mg/m2 | Freq: Once | INTRAVENOUS | Status: AC
  Administered 2023-07-14: 780 mg via INTRAVENOUS
  Filled 2023-07-14: qty 39

## 2023-07-14 MED ORDER — DEXAMETHASONE SODIUM PHOSPHATE 10 MG/ML IJ SOLN
10.0000 mg | Freq: Once | INTRAMUSCULAR | Status: AC
  Administered 2023-07-14: 10 mg via INTRAVENOUS
  Filled 2023-07-14: qty 1

## 2023-07-14 MED ORDER — ATROPINE SULFATE 1 MG/ML IV SOLN
0.5000 mg | Freq: Once | INTRAVENOUS | Status: AC | PRN
  Administered 2023-07-14: 0.5 mg via INTRAVENOUS
  Filled 2023-07-14: qty 1

## 2023-07-14 MED ORDER — METOPROLOL TARTRATE 25 MG PO TABS: 12.5000 mg | ORAL_TABLET | Freq: Two times a day (BID) | ORAL | 2 refills | Status: DC

## 2023-07-14 MED ORDER — PALONOSETRON HCL INJECTION 0.25 MG/5ML
0.2500 mg | Freq: Once | INTRAVENOUS | Status: AC
  Administered 2023-07-14: 0.25 mg via INTRAVENOUS
  Filled 2023-07-14: qty 5

## 2023-07-14 MED ORDER — SODIUM CHLORIDE 0.9 % IV SOLN: Freq: Once | INTRAVENOUS | Status: AC

## 2023-07-14 MED ORDER — SODIUM CHLORIDE 0.9 % IV SOLN
2000.0000 mg/m2 | INTRAVENOUS | Status: DC
  Administered 2023-07-14: 3900 mg via INTRAVENOUS
  Filled 2023-07-14: qty 78

## 2023-07-14 MED ORDER — SODIUM CHLORIDE 0.9% FLUSH: 10.0000 mL | INTRAVENOUS | Status: DC | PRN

## 2023-07-14 NOTE — Progress Notes (Signed)
 [facility]     [phone] Fax:([phone]) [phone]    Patient Care Team: [name], MD as PCP - General (Internal Medicine) Bridgette Campus, MD as PCP - Cardiology (Cardiology) Berna Breslow, RN (Inactive) as Oncology Nurse Navigator Sonja [place], MD as Consulting Physician (Oncology) Alvis Jourdain, MD as Consulting Physician (Gastroenterology)   CHIEF COMPLAINT: Follow-up metastatic colon cancer  Oncology History  metastatic colon cancer to liver  06/20/2019 Procedure   Colonoscopy by Dr Brice Campi 06/20/19  IMPRESSION -Seven 3 to 10 mm polyps in the sigmoid colon, in the transverse colon and in the escending colon, removed with a cold snare. Resected and retrireved.  -One 20mm polyp in the descending colon. Biopsies. Tattoes.  -Mediaum sized lipoma in the ascending colon.   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 See Comment B. Colon, Ascending, polyp, Polypectomy:  -TUBULAR ADENOMA -No high grade dysplasia or malignancy.  C. Colon, TRansverse, Polyo, polectomy:  -TUBULAR ADENOMA -No high grade dysplasia or malignancy.  D. Colon, Sigmoid, Polyp, Polypectomy:  -HYPERPLASTIC POLYP   11/07/2019 Imaging   US  Abdomen 11/07/19  IMPRESSION: 1. Two solid masses in the liver are nonspecific. Recommend MRI abdomen with and without contrast for further evaluation.   12/15/2019 Imaging   MRI Abdomen 12/15/19  IMPRESSION: 1. There are two large masses in the liver with appearance favoring metastatic disease or hepatocellular carcinoma or cholangiocarcinoma. A benign etiology is highly unlikely given the enhancement pattern and associated adenopathy. 2. Considerable porta hepatis and retroperitoneal adenopathy. Some of the confluent porta hepatis tumor is potentially infiltrative and abuts the pancreatic body along its right upper margin, making it difficult to  completely exclude the possibility of pancreatic adenocarcinoma primary. Possibilities helpful in further workup might include tissue diagnosis, endoscopic ultrasound, or nuclear medicine PET-CT. 3. Pancreas divisum. 4. Lumbar spondylosis and degenerative disc disease. 5. Despite efforts by the technologist and patient, motion artifact is present on today's exam and could not be eliminated. This reduces exam sensitivity and specificity.   12/22/2019 Procedure   Upper Endoscopy by Dr Nickey Barn 12/22/19  IMPRESSION - One lymph node was visualized and measured in the porta hepatis region. Fine needle aspiration performed    12/22/2019 Initial Biopsy   A. LIVER, PORTA HEPATIS MASS, FINE NEEDLE 12/22/19 ASPIRATION:  FINAL MICROSCOPIC DIAGNOSIS:  - Malignant cells consistent with metastatic adenocarcinoma   01/01/2020 Initial Diagnosis   Intrahepatic cholangiocarcinoma (HCC)   01/08/2020 Initial Biopsy   FINAL MICROSCOPIC DIAGNOSIS:   A. LIVER, LEFT LOBE, BIOPSY:  - Metastatic adenocarcinoma, consistent with a colorectal primary.  See  comment      COMMENT:   Immunohistochemical stains show the tumor cells are positive for CK20  and CDX2 but negative for CK7, consistent with above interpretation.  Dr. Maryalice Smaller was notified on 01/10/2020   01/08/2020 Genetic Testing   [name]  [name] wildtype and [name]/[name] mutations which make her eligible for target biological agent Vectibix .   01/24/2020 Procedure   PAC placed 01/24/20   01/25/2020 -  Chemotherapy   first line FOLFOX starting 01/25/2020, Vextibix  added with C2 (02/06/20)   02/06/2020 - 02/06/2022 Chemotherapy   Patient is on Treatment Plan : COLORECTAL Panitumumab  q14d ([name] - Type Gene Only)     06/20/2020 Imaging   CT CAP  IMPRESSION: 1. Continued interval reduction in size and conspicuity of a subsolid nodule of the peripheral  left upper lobe. 2. Unchanged prominent pretracheal and subcarinal lymph nodes. 3.  Redemonstrated partially calcified low-attenuation liver masses, slightly decreased in size compared to prior examination. 4. Slight interval decrease in size of a portacaval lymph node or conglomerate and retroperitoneal lymph nodes. 5. Findings are consistent with continued treatment response of nodal, pulmonary, and hepatic metastatic disease. 6. Coronary artery disease.   Aortic Atherosclerosis (ICD10-I70.0).     10/09/2020 Imaging   IMPRESSION: 1. Slight decrease in size of dominant liver mass with smaller liver mass within 1-2 mm of prior measurement. 2. Stable appearance of celiac lymph and retroperitoneal lymph nodes. Dominant node with calcification in the gastrohepatic ligament as described. 3. Continued decrease in size of LEFT upper lobe nodule. 4. Three-vessel coronary artery calcification. 5. Aortic atherosclerosis.   03/25/2022 -  Chemotherapy   Patient is on Treatment Plan : COLORECTAL FOLFIRI + Bevacizumab  q14d     08/31/2022 Imaging    IMPRESSION: 1. Unchanged, densely calcified liver lesions, consistent with treated metastatic disease. 2. Unchanged, irregular subpleural opacity of the peripheral left upper lobe. 3. Unchanged subcentimeter gastrohepatic ligament lymph node. 4. No evidence of new metastatic disease in the chest, abdomen, or pelvis. 5. Status post hysterectomy. 6. Coronary artery disease. 7. Aortic valve calcifications. Correlate for echocardiographic evidence of aortic valve dysfunction.     06/22/2023 Imaging   CT Chest, Abdomen, and pelvis with contrast  IMPRESSION: Centrally calcified liver lesions are again seen and slightly increased today. Prominent nodes in the upper retroperitoneum are overall similar. Attention on follow-up.   Stable branching left upper lobe lung nodule.      CURRENT THERAPY: Second line FOLFIRI/bevacizumab  q2 weeks, s/p cycle 30   INTERVAL HISTORY Ms. Heinrich returns for follow-up and treatment as scheduled,  completed another cycle of FOLFIRI/Beva 06/30/23.  Periodic constipation at baseline, managed with milk of mag.  Sees some blood in stool when particularly constipated, none recently.  Denies abdominal pain/bloating, nausea or vomiting.  Neuropathy is stable, she functions well.  Remains mobile and active with good appetite.  Waiting for metoprolol  refill, she has been out a week and a half or so.  ROS  All other systems reviewed and negative  Past Medical History:  Diagnosis Date   Arthritis    Diabetes mellitus without complication (HCC)    Hypertension    met colon ca to liver 01/2020     Past Surgical History:  Procedure Laterality Date   ABDOMINAL HYSTERECTOMY     ANTERIOR AND POSTERIOR REPAIR WITH SACROSPINOUS FIXATION N/A 07/16/2016   Procedure: ANTERIOR AND POSTERIOR REPAIR WITH SACROSPINOUS FIXATION;  Surgeon: Thora Flint, MD;  Location: [facility];  Service: Gynecology;  Laterality: N/A;   CYSTOSCOPY  07/16/2016   Procedure: CYSTOSCOPY;  Surgeon: Thora Flint, MD;  Location: [facility];  Service: Gynecology;;   ESOPHAGOGASTRODUODENOSCOPY (EGD) WITH PROPOFOL  N/A 12/22/2019   Procedure: ESOPHAGOGASTRODUODENOSCOPY (EGD) WITH PROPOFOL ;  Surgeon: Alvis Jourdain, MD;  Location: [facility];  Service: Endoscopy;  Laterality: N/A;   FINE NEEDLE ASPIRATION N/A 12/22/2019   Procedure: FINE NEEDLE ASPIRATION (FNA) LINEAR;  Surgeon: Alvis Jourdain, MD;  Location: [facility];  Service: Endoscopy;  Laterality: N/A;   IR IMAGING GUIDED PORT INSERTION  01/24/2020   LAPAROSCOPIC VAGINAL HYSTERECTOMY WITH SALPINGECTOMY Bilateral 07/16/2016   Procedure: LAPAROSCOPIC ASSISTED VAGINAL HYSTERECTOMY WITH SALPINGECTOMY;  Surgeon: Thora Flint, MD;  Location: [facility];  Service: Gynecology;  Laterality: Bilateral;   LEFT HEART CATH AND CORONARY ANGIOGRAPHY N/A 05/07/2023   Procedure: LEFT HEART CATH  AND CORONARY ANGIOGRAPHY;  Surgeon: Arleen Lacer, MD;  Location: [facility]ASIVE CV LAB;  Service:  Cardiovascular;  Laterality: N/A;   MYOMECTOMY ABDOMINAL APPROACH     THYROID  SURGERY     tyroid     UPPER ESOPHAGEAL ENDOSCOPIC ULTRASOUND (EUS) N/A 12/22/2019   Procedure: UPPER ESOPHAGEAL ENDOSCOPIC ULTRASOUND (EUS);  Surgeon: Alvis Jourdain, MD;  Location: [facility];  Service: Endoscopy;  Laterality: N/A;     Outpatient Encounter Medications as of 07/14/2023  Medication Sig   aspirin  EC 81 MG tablet Take 1 tablet (81 mg total) by mouth daily. Swallow whole.   atorvastatin  (LIPITOR) 80 MG tablet Take 1 tablet (80 mg total) by mouth daily.   clopidogrel  (PLAVIX ) 75 MG tablet Take 1 tablet (75 mg total) by mouth daily.   pantoprazole  (PROTONIX ) 20 MG tablet Take 1 tablet (20 mg total) by mouth daily.   [DISCONTINUED] metoprolol  tartrate (LOPRESSOR ) 25 MG tablet Take 0.5 tablets (12.5 mg total) by mouth 2 (two) times daily.   metoprolol  tartrate (LOPRESSOR ) 25 MG tablet Take 0.5 tablets (12.5 mg total) by mouth 2 (two) times daily.   Facility-Administered Encounter Medications as of 07/14/2023  Medication   [COMPLETED] sodium chloride  flush (NS) 0.9 % injection 10 mL     Today's Vitals   07/14/23 0941 07/14/23 0952  BP: 134/74   Pulse: 62   Resp: 18   Temp: (!) 97.4 F (36.3 C)   TempSrc: Tympanic   SpO2: 100%   Weight: 171 lb 9.6 oz (77.8 kg)   Height: 5\' 6"  (1.676 m)   PainSc:  0-No pain   Body mass index is 27.7 kg/m.   ECOG PERFORMANCE STATUS: 1 - Symptomatic but completely ambulatory  PHYSICAL EXAM GENERAL:alert, no distress and comfortable SKIN: no rash  EYES: sclera clear NECK: without mass LYMPH:  no palpable cervical or supraclavicular lymphadenopathy  LUNGS: clear with normal breathing effort HEART: regular rate & rhythm, no lower extremity edema ABDOMEN: abdomen soft, non-tender and normal bowel sounds NEURO: alert & oriented x 3 with fluent speech, no focal motor/sensory deficits Breast exam:  PAC without erythema    CBC    Latest Ref Rng & Units  07/14/2023    9:08 AM 06/30/2023    9:29 AM 06/16/2023    9:51 AM  CBC  WBC 4.0 - 10.5 K/uL 12.0  17.3  11.4   Hemoglobin 12.0 - 15.0 g/dL 84.6  96.2  95.2   Hematocrit 36.0 - 46.0 % 35.5  36.4  36.9   Platelets 150 - 400 K/uL 293  286  274       CMP     Latest Ref Rng & Units 07/14/2023    9:08 AM 07/09/2023   10:31 AM 06/30/2023    9:29 AM  CMP  Glucose 70 - 99 mg/dL 841   324   BUN 8 - 23 mg/dL 8   8   Creatinine 4.01 - 1.00 mg/dL 0.27   2.53   Sodium 664 - 145 mmol/L 140   141   Potassium 3.5 - 5.1 mmol/L 3.7   3.4   Chloride 98 - 111 mmol/L 103   106   CO2 22 - 32 mmol/L 29   30   Calcium  8.9 - 10.3 mg/dL 9.4   9.2   Total Protein 6.5 - 8.1 g/dL 7.3  7.2  7.1   Total Bilirubin 0.0 - 1.2 mg/dL 0.3  <4.0  0.3   Alkaline Phos 38 - 126 U/L 181  249  168   AST 15 - 41 U/L 18  14  13    ALT 0 - 44 U/L 15  14  14        ASSESSMENT & PLAN:Jocabed E Schoendorf is a 68 y.o. female with    metastatic colon cancer to liver MSS, HER2 -, BRAF negative, [name]/[name] wildtype -diagnosed 12/2019 by porta hepatis LN biopsy during EUS for work up of abdominal pain and liver lesions seen on US  and MRI.  -Liver biopsy 01/08/20 confirmed metastasis from primary colorectal cancer. PET scan showed hypermetabolism to known liver mets, diffuse thoracic and abdominal lymphadenopathy, a 1.8 cm LUL pulmonary nodule, and splenic flexure of colon. -treated with first line FOLFOX 01/25/20 - 10/02/20, Vectibix  added with C2. Oxali discontinued after C18 due to reaction. -switched to Xeloda  10/21/20, dose adjusted due to significant skin toxicity -Last CT CAP 03/19/22 showed progression in liver and gastrohepatic ligament LN; she began FOLFIRI/beva on 03/25/22 -Restaging CT's 06/16/22, 08/31/22, 12/14/22 and 03/24/23 showed stable disease despite rising CEA. CT 06/22/23 showed mild progression in central liver lesions, but within stable disease range by measurements, otherwise stable -We have ordered FO liquid biopsy and HER2 (which  is negative) to see if she has new RAS mutations, if she remains [name]/[name]/BRAF negative/wildtype, will change Beva back to vectibix  to see if this achieves better disease control.  -Ms. Mantz appears stable, tolerating FOLFIRI/Beva with mild constipation and stable neuropathy.  SEs adequately managed with supportive care at home, able to recover and function well with good PS.  -Labs reviewed, adequate to proceed with cycle 31 FOLFIRI/Beva at same dose.  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 Maryalice Smaller   All questions were answered. The patient knows to call the clinic with any problems, questions or concerns. No barriers to learning were detected.   Marga Gramajo K Dae Highley, NP 07/14/2023

## 2023-07-14 NOTE — Patient Instructions (Signed)
 [facility] - A DEPT OF [facility]  Discharge Instructions: Thank you for choosing [facility] to provide your oncology and hematology care.   If you have a lab appointment with the Cancer Center, please go directly to the Cancer Center and check in at the registration area.   Wear comfortable clothing and clothing appropriate for easy access to any Portacath or PICC line.   We strive to give you quality time with your provider. You may need to reschedule your appointment if you arrive late (15 or more minutes).  Arriving late affects you and other patients whose appointments are after yours.  Also, if you miss three or more appointments without notifying the office, you may be dismissed from the clinic at the provider's discretion.      For prescription refill requests, have your pharmacy contact our office and allow 72 hours for refills to be completed.    Today you received the following chemotherapy and/or immunotherapy agents Irinotecan/Leucovorin/5FU pump      To help prevent nausea and vomiting after your treatment, we encourage you to take your nausea medication as directed.  BELOW ARE SYMPTOMS THAT SHOULD BE REPORTED IMMEDIATELY: *FEVER GREATER THAN 100.4 F (38 C) OR HIGHER *CHILLS OR SWEATING *NAUSEA AND VOMITING THAT IS NOT CONTROLLED WITH YOUR NAUSEA MEDICATION *UNUSUAL SHORTNESS OF BREATH *UNUSUAL BRUISING OR BLEEDING *URINARY PROBLEMS (pain or burning when urinating, or frequent urination) *BOWEL PROBLEMS (unusual diarrhea, constipation, pain near the anus) TENDERNESS IN MOUTH AND THROAT WITH OR WITHOUT PRESENCE OF ULCERS (sore throat, sores in mouth, or a toothache) UNUSUAL RASH, SWELLING OR PAIN  UNUSUAL VAGINAL DISCHARGE OR ITCHING   Items with * indicate a potential emergency and should be followed up as soon as possible or go to the Emergency Department if any problems should occur.  Please show the CHEMOTHERAPY ALERT  CARD or IMMUNOTHERAPY ALERT CARD at check-in to the Emergency Department and triage nurse.  Should you have questions after your visit or need to cancel or reschedule your appointment, please contact [facility] - A DEPT OF [facility]  Dept: [phone]  and follow the prompts.  Office hours are 8:00 a.m. to 4:30 p.m. Monday - Friday. Please note that voicemails left after 4:00 p.m. may not be returned until the following business day.  We are closed weekends and major holidays. You have access to a nurse at all times for urgent questions. Please call the main number to the clinic Dept: [phone] and follow the prompts.   For any non-urgent questions, you may also contact your provider using MyChart. We now offer e-Visits for anyone 3 and older to request care online for non-urgent symptoms. For details visit [email or web address].   Also download the MyChart app! Go to the app store, search "MyChart", open the app, select [facility], and log in with your MyChart username and password.

## 2023-07-16 ENCOUNTER — Inpatient Hospital Stay: Payer: Medicare (Managed Care)

## 2023-07-16 VITALS — BP 104/66 | HR 67 | Temp 98.1°F | Resp 18

## 2023-07-16 DIAGNOSIS — Z5111 Encounter for antineoplastic chemotherapy: Secondary | ICD-10-CM | POA: Diagnosis not present

## 2023-07-16 DIAGNOSIS — C221 Intrahepatic bile duct carcinoma: Secondary | ICD-10-CM

## 2023-07-16 MED ORDER — HEPARIN SOD (PORK) LOCK FLUSH 100 UNIT/ML IV SOLN
500.0000 [IU] | Freq: Once | INTRAVENOUS | Status: AC | PRN
  Administered 2023-07-16: 500 [IU]

## 2023-07-16 MED ORDER — PEGFILGRASTIM-CBQV 6 MG/0.6ML ~~LOC~~ SOSY
6.0000 mg | PREFILLED_SYRINGE | Freq: Once | SUBCUTANEOUS | Status: AC
  Administered 2023-07-16: 6 mg via SUBCUTANEOUS

## 2023-07-16 MED ORDER — SODIUM CHLORIDE 0.9% FLUSH
10.0000 mL | INTRAVENOUS | Status: DC | PRN
  Administered 2023-07-16: 10 mL

## 2023-07-16 NOTE — Patient Instructions (Signed)

## 2023-07-27 NOTE — Assessment & Plan Note (Signed)
-  overall mild and stable

## 2023-07-27 NOTE — Assessment & Plan Note (Signed)
 MSS, [name]/[name] wildtype -diagnosed 12/2019 by porta hepatis LN biopsy during EUS for work up of abdominal pain and liver lesions seen on US  and MRI. Liver biopsy 01/08/20 confirmed metastasis from primary colorectal cancer. PET scan showed hypermetabolism to known liver mets, diffuse thoracic and abdominal lymphadenopathy, a 1.8 cm LUL pulmonary nodule, and splenic flexure of colon. -treated with first line FOLFOX 01/25/20 - 10/02/20, Vectibix  added with C2. Oxali discontinued after C18 due to reaction. -switched to Xeloda  10/21/20, dose adjusted due to significant skin toxicity -due to cancer progression, treatment has been changed to FOLFIRI and bevacizumab  on 03/25/22, she is overall tolerating well -will repeat CT scan after next cycle chemo  -Restaging CT scan from 06/16/2022 showed stable to mild decrease in size of treated liver metastasis. No new lesions -She is tolerating chemotherapy well overall, will continue -Her restaging CT scan on August 31, 2022 showed stable liver metastasis, and a stable 7 mm left lung nodule, indeterminate.  I personally reviewed the scan images with patient -Will continue current therapy.  We discussed the option of maintenance therapy with irinotecan  and bevacizumab  in future.  She is tolerating current FOLFIRI and bevacizumab  well, will continue. -Restaging CT 03/24/2023 showed stable disease.  -restaging CT in 06/2023 showed mild disease progression in liver, plan to change Beva to vectibix 

## 2023-07-28 ENCOUNTER — Inpatient Hospital Stay: Payer: Medicare (Managed Care) | Admitting: Hematology

## 2023-07-28 ENCOUNTER — Encounter: Payer: Self-pay | Admitting: Hematology

## 2023-07-28 ENCOUNTER — Inpatient Hospital Stay: Payer: Medicare (Managed Care)

## 2023-07-28 ENCOUNTER — Other Ambulatory Visit: Payer: Self-pay | Admitting: Hematology

## 2023-07-28 VITALS — BP 142/80 | HR 64 | Temp 98.3°F | Resp 19 | Ht 66.0 in | Wt 174.1 lb

## 2023-07-28 DIAGNOSIS — G620 Drug-induced polyneuropathy: Secondary | ICD-10-CM

## 2023-07-28 DIAGNOSIS — T80212A Local infection due to central venous catheter, initial encounter: Secondary | ICD-10-CM | POA: Diagnosis not present

## 2023-07-28 DIAGNOSIS — Z95828 Presence of other vascular implants and grafts: Secondary | ICD-10-CM

## 2023-07-28 DIAGNOSIS — T451X5A Adverse effect of antineoplastic and immunosuppressive drugs, initial encounter: Secondary | ICD-10-CM

## 2023-07-28 DIAGNOSIS — C221 Intrahepatic bile duct carcinoma: Secondary | ICD-10-CM

## 2023-07-28 DIAGNOSIS — Z5111 Encounter for antineoplastic chemotherapy: Secondary | ICD-10-CM | POA: Diagnosis not present

## 2023-07-28 LAB — CBC WITH DIFFERENTIAL (CANCER CENTER ONLY)
Abs Immature Granulocytes: 0.12 10*3/uL — ABNORMAL HIGH (ref 0.00–0.07)
Basophils Absolute: 0.1 10*3/uL (ref 0.0–0.1)
Basophils Relative: 1 %
Eosinophils Absolute: 0.3 10*3/uL (ref 0.0–0.5)
Eosinophils Relative: 2 %
HCT: 37 % (ref 36.0–46.0)
Hemoglobin: 11.9 g/dL — ABNORMAL LOW (ref 12.0–15.0)
Immature Granulocytes: 1 %
Lymphocytes Relative: 10 %
Lymphs Abs: 1.6 10*3/uL (ref 0.7–4.0)
MCH: 28.1 pg (ref 26.0–34.0)
MCHC: 32.2 g/dL (ref 30.0–36.0)
MCV: 87.3 fL (ref 80.0–100.0)
Monocytes Absolute: 1 10*3/uL (ref 0.1–1.0)
Monocytes Relative: 6 %
Neutro Abs: 13.1 10*3/uL — ABNORMAL HIGH (ref 1.7–7.7)
Neutrophils Relative %: 80 %
Platelet Count: 292 10*3/uL (ref 150–400)
RBC: 4.24 MIL/uL (ref 3.87–5.11)
RDW: 17.1 % — ABNORMAL HIGH (ref 11.5–15.5)
WBC Count: 16.3 10*3/uL — ABNORMAL HIGH (ref 4.0–10.5)
nRBC: 0 % (ref 0.0–0.2)

## 2023-07-28 LAB — CMP (CANCER CENTER ONLY)
ALT: 18 U/L (ref 0–44)
AST: 20 U/L (ref 15–41)
Albumin: 4.3 g/dL (ref 3.5–5.0)
Alkaline Phosphatase: 198 U/L — ABNORMAL HIGH (ref 38–126)
Anion gap: 8 (ref 5–15)
BUN: 10 mg/dL (ref 8–23)
CO2: 28 mmol/L (ref 22–32)
Calcium: 9.7 mg/dL (ref 8.9–10.3)
Chloride: 104 mmol/L (ref 98–111)
Creatinine: 0.69 mg/dL (ref 0.44–1.00)
GFR, Estimated: >60 mL/min (ref >60)
Glucose, Bld: 123 mg/dL — ABNORMAL HIGH (ref 70–99)
Potassium: 4 mmol/L (ref 3.5–5.1)
Sodium: 140 mmol/L (ref 135–145)
Total Bilirubin: 0.3 mg/dL (ref 0.0–1.2)
Total Protein: 7.6 g/dL (ref 6.5–8.1)

## 2023-07-28 LAB — TOTAL PROTEIN, URINE DIPSTICK: Protein, ur: NEGATIVE mg/dL

## 2023-07-28 MED ORDER — DOXYCYCLINE HYCLATE 100 MG PO TABS: 100.0000 mg | ORAL_TABLET | Freq: Two times a day (BID) | ORAL | 0 refills | Status: DC

## 2023-07-28 MED ORDER — SODIUM CHLORIDE 0.9% FLUSH: 10.0000 mL | Freq: Once | INTRAVENOUS | Status: DC

## 2023-07-28 NOTE — Progress Notes (Signed)
 [facility]   [phone] [phone]   Clinic Follow up Note   Patient Care Team: [name], MD as PCP - General (Internal Medicine) [name] [name], MD as PCP - Cardiology (Cardiology) Berna Breslow, RN (Inactive) as Oncology Nurse Navigator Sonja [place], MD as Consulting Physician (Oncology) Alvis Jourdain, MD as Consulting Physician (Gastroenterology)  Date of Service:  07/28/2023  CHIEF COMPLAINT: f/u of metastatic colon cancer  CURRENT THERAPY:  Chemotherapy FOLFIRI   Oncology History   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 Liver biopsy 01/08/20 confirmed metastasis from primary colorectal cancer. PET scan showed hypermetabolism to known liver mets, diffuse thoracic and abdominal lymphadenopathy, a 1.8 cm LUL pulmonary nodule, and splenic flexure of colon. -treated with first line FOLFOX 01/25/20 - 10/02/20, Vectibix  added with C2. Oxali discontinued after C18 due to reaction. -switched to Xeloda  10/21/20, dose adjusted due to significant skin toxicity -due to cancer progression, treatment has been changed to FOLFIRI and bevacizumab  on 03/25/22, she is overall tolerating well -will repeat CT scan after next cycle chemo  -Restaging CT scan from 06/16/2022 showed stable to mild decrease in size of treated liver metastasis. No new lesions -She is tolerating chemotherapy well overall, will continue -Her restaging CT scan on August 31, 2022 showed stable liver metastasis, and a stable 7 mm left lung nodule, indeterminate.  I personally reviewed the scan images with patient -Will continue current therapy.  We discussed the option of maintenance therapy with irinotecan  and bevacizumab  in future.  She is tolerating current FOLFIRI and  bevacizumab  well, will continue. -Restaging CT 03/24/2023 showed stable disease.  -restaging CT in 06/2023 showed mild disease progression in liver, plan to change Beva to vectibix    Assessment & Plan Metastatic colon cancer Metastatic colon cancer with current treatment plan involving Vectibix . Chemotherapy postponed due to port site infection. No new symptoms reported since last visit. White blood cell count slightly elevated, hemoglobin within normal range. Kidney and liver function tests pending. Vectibix  previously caused a rash but did not induce systemic illness. Chemotherapy rescheduled for next week, pending port removal and resolution of infection. - Reschedule chemotherapy for next week pending port removal and infection resolution - Include Vectibix  in chemotherapy regimen - Monitor blood counts and organ function tests  Port site infection Port site infection with bleeding and soreness, initiated last week. No fever or systemic infection signs. Possible need for port removal due to infection and weight loss. Advised to have someone drive her for port removal due to sedation. - Prescribe oral antibiotics (Augmentin or doxycycline ) for one week - Coordinate with interventional radiology for port removal, ideally today or tomorrow - Advise accompaniment for port removal due to sedation - Apply bandage if bleeding stops  Plan - Due to her port infection, will hold on chemotherapy today. - I called in doxycycline  - Port removal by IR as soon as possible.  It is scheduled for 5/23 -f/u in one week, may need to change her chemo regimen due to lack of port    SUMMARY OF ONCOLOGIC HISTORY: Oncology History  metastatic colon cancer to liver  06/20/2019 Procedure   Colonoscopy by Dr Brice Campi 06/20/19  IMPRESSION -Seven 3 to 10 mm polyps in the sigmoid colon, in the transverse colon and in  the escending colon, removed with a cold snare. Resected and retrireved.  -One 20mm polyp in the  descending colon. Biopsies. Tattoes.  -Mediaum sized lipoma in the ascending colon.   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 See Comment B. Colon, Ascending, polyp, Polypectomy:  -TUBULAR ADENOMA -No high grade dysplasia or malignancy.  C. Colon, TRansverse, Polyo, polectomy:  -TUBULAR ADENOMA -No high grade dysplasia or malignancy.  D. Colon, Sigmoid, Polyp, Polypectomy:  -HYPERPLASTIC POLYP   11/07/2019 Imaging   US  Abdomen 11/07/19  IMPRESSION: 1. Two solid masses in the liver are nonspecific. Recommend MRI abdomen with and without contrast for further evaluation.   12/15/2019 Imaging   MRI Abdomen 12/15/19  IMPRESSION: 1. There are two large masses in the liver with appearance favoring metastatic disease or hepatocellular carcinoma or cholangiocarcinoma. A benign etiology is highly unlikely given the enhancement pattern and associated adenopathy. 2. Considerable porta hepatis and retroperitoneal adenopathy. Some of the confluent porta hepatis tumor is potentially infiltrative and abuts the pancreatic body along its right upper margin, making it difficult to completely exclude the possibility of pancreatic adenocarcinoma primary. Possibilities helpful in further workup might include tissue diagnosis, endoscopic ultrasound, or nuclear medicine PET-CT. 3. Pancreas divisum. 4. Lumbar spondylosis and degenerative disc disease. 5. Despite efforts by the technologist and patient, motion artifact is present on today's exam and could not be eliminated. This reduces exam sensitivity and specificity.   12/22/2019 Procedure   Upper Endoscopy by Dr Nickey Barn 12/22/19  IMPRESSION - One lymph node was visualized and measured in the porta hepatis region. Fine needle aspiration performed    12/22/2019 Initial Biopsy   A. LIVER, PORTA HEPATIS MASS, FINE NEEDLE 12/22/19 ASPIRATION:  FINAL MICROSCOPIC  DIAGNOSIS:  - Malignant cells consistent with metastatic adenocarcinoma   01/01/2020 Initial Diagnosis   Intrahepatic cholangiocarcinoma (HCC)   01/08/2020 Initial Biopsy   FINAL MICROSCOPIC DIAGNOSIS:   A. LIVER, LEFT LOBE, BIOPSY:  - Metastatic adenocarcinoma, consistent with a colorectal primary.  See  comment      COMMENT:   Immunohistochemical stains show the tumor cells are positive for CK20  and CDX2 but negative for CK7, consistent with above interpretation.  Dr. Maryalice Smaller was notified on 01/10/2020   01/08/2020 Genetic Testing   [name]  [name] wildtype and [name]/[name] mutations which make her eligible for target biological agent Vectibix .   01/24/2020 Procedure   PAC placed 01/24/20   01/25/2020 -  Chemotherapy   first line FOLFOX starting 01/25/2020, Vextibix  added with C2 (02/06/20)   02/06/2020 - 02/06/2022 Chemotherapy   Patient is on Treatment Plan : COLORECTAL Panitumumab  q14d ([name] Wild - Type Gene Only)     06/20/2020 Imaging   CT CAP  IMPRESSION: 1. Continued interval reduction in size and conspicuity of a subsolid nodule of the peripheral left upper lobe. 2. Unchanged prominent pretracheal and subcarinal lymph nodes. 3. Redemonstrated partially calcified low-attenuation liver masses, slightly decreased in size compared to prior examination. 4. Slight interval decrease in size of a portacaval lymph node or conglomerate and retroperitoneal lymph nodes. 5. Findings are consistent with continued treatment response of nodal, pulmonary, and hepatic metastatic disease. 6. Coronary artery disease.   Aortic Atherosclerosis (ICD10-I70.0).     10/09/2020 Imaging   IMPRESSION: 1. Slight decrease in size of dominant liver mass with smaller liver mass within 1-2 mm of prior measurement. 2. Stable appearance of celiac lymph and retroperitoneal lymph nodes. Dominant node with calcification  in the gastrohepatic ligament as described. 3. Continued decrease in size  of LEFT upper lobe nodule. 4. Three-vessel coronary artery calcification. 5. Aortic atherosclerosis.   03/25/2022 -  Chemotherapy   Patient is on Treatment Plan : COLORECTAL FOLFIRI + Bevacizumab  q14d     08/31/2022 Imaging    IMPRESSION: 1. Unchanged, densely calcified liver lesions, consistent with treated metastatic disease. 2. Unchanged, irregular subpleural opacity of the peripheral left upper lobe. 3. Unchanged subcentimeter gastrohepatic ligament lymph node. 4. No evidence of new metastatic disease in the chest, abdomen, or pelvis. 5. Status post hysterectomy. 6. Coronary artery disease. 7. Aortic valve calcifications. Correlate for echocardiographic evidence of aortic valve dysfunction.     06/22/2023 Imaging   CT Chest, Abdomen, and pelvis with contrast  IMPRESSION: Centrally calcified liver lesions are again seen and slightly increased today. Prominent nodes in the upper retroperitoneum are overall similar. Attention on follow-up.   Stable branching left upper lobe lung nodule.      Discussed the use of AI scribe software for clinical note transcription with the patient, who gave verbal consent to proceed.  History of Present Illness Lindsay [name] is a 68 year old female with metastatic colon cancer who presents with issues related to her port.  Her port has been bleeding and is sore, with the bleeding starting last week. A bandage is applied to prevent it from sticking to her gown. Initially, the area started with a 'red dot' that enlarged and began bleeding. There is discharge from the port site, but no warmth in the breast area. She has not experienced any fever since the bleeding started. Her white blood cell count is slightly elevated, but her hemoglobin is stable. She is eating and drinking well. Kidney and liver function tests are pending.     All other systems were reviewed with the patient and are negative.  MEDICAL HISTORY:  Past Medical History:   Diagnosis Date   Arthritis    Diabetes mellitus without complication (HCC)    Hypertension    met colon ca to liver 01/2020    SURGICAL HISTORY: Past Surgical History:  Procedure Laterality Date   ABDOMINAL HYSTERECTOMY     ANTERIOR AND POSTERIOR REPAIR WITH SACROSPINOUS FIXATION N/A 07/16/2016   Procedure: ANTERIOR AND POSTERIOR REPAIR WITH SACROSPINOUS FIXATION;  Surgeon: Thora Flint, MD;  Location: [facility];  Service: Gynecology;  Laterality: N/A;   CYSTOSCOPY  07/16/2016   Procedure: CYSTOSCOPY;  Surgeon: Thora Flint, MD;  Location: [facility];  Service: Gynecology;;   ESOPHAGOGASTRODUODENOSCOPY (EGD) WITH PROPOFOL  N/A 12/22/2019   Procedure: ESOPHAGOGASTRODUODENOSCOPY (EGD) WITH PROPOFOL ;  Surgeon: Alvis Jourdain, MD;  Location: [facility];  Service: Endoscopy;  Laterality: N/A;   FINE NEEDLE ASPIRATION N/A 12/22/2019   Procedure: FINE NEEDLE ASPIRATION (FNA) LINEAR;  Surgeon: Alvis Jourdain, MD;  Location: [facility];  Service: Endoscopy;  Laterality: N/A;   IR IMAGING GUIDED PORT INSERTION  01/24/2020   LAPAROSCOPIC VAGINAL HYSTERECTOMY WITH SALPINGECTOMY Bilateral 07/16/2016   Procedure: LAPAROSCOPIC ASSISTED VAGINAL HYSTERECTOMY WITH SALPINGECTOMY;  Surgeon: Thora Flint, MD;  Location: [facility];  Service: Gynecology;  Laterality: Bilateral;   LEFT HEART CATH AND CORONARY ANGIOGRAPHY N/A 05/07/2023   Procedure: LEFT HEART CATH AND CORONARY ANGIOGRAPHY;  Surgeon: Arleen Lacer, MD;  Location: [facility] IN[facility]ASIVE CV LAB;  Service: Cardiovascular;  Laterality: N/A;   MYOMECTOMY ABDOMINAL APPROACH     THYROID SURGERY     tyroid     UPPER ESOPHAGEAL ENDOSCOPIC ULTRASOUND (EUS) N/A 12/22/2019   Procedure:  UPPER ESOPHAGEAL ENDOSCOPIC ULTRASOUND (EUS);  Surgeon: Alvis Jourdain, MD;  Location: [facility];  Service: Endoscopy;  Laterality: N/A;    I have reviewed the social history and family history with the patient and they are unchanged from previous note.  ALLERGIES:  is allergic to  oxaliplatin .  MEDICATIONS:  Current Outpatient Medications  Medication Sig Dispense Refill   aspirin  EC 81 MG tablet Take 1 tablet (81 mg total) by mouth daily. Swallow whole. 90 tablet 0   atorvastatin  (LIPITOR) 80 MG tablet Take 1 tablet (80 mg total) by mouth daily. 90 tablet 0   clopidogrel  (PLAVIX ) 75 MG tablet Take 1 tablet (75 mg total) by mouth daily. 90 tablet 0   doxycycline  (VIBRA -TABS) 100 MG tablet Take 1 tablet (100 mg total) by mouth 2 (two) times daily. 14 tablet 0   metoprolol  tartrate (LOPRESSOR ) 25 MG tablet Take 0.5 tablets (12.5 mg total) by mouth 2 (two) times daily. 30 tablet 2   pantoprazole  (PROTONIX ) 20 MG tablet Take 1 tablet (20 mg total) by mouth daily. 30 tablet 1   No current facility-administered medications for this visit.   Facility-Administered Medications Ordered in Other Visits  Medication Dose Route Frequency Provider Last Rate Last Admin   sodium chloride  flush (NS) 0.9 % injection 10 mL  10 mL Intracatheter Once Sonja Dyer, MD        PHYSICAL EXAMINATION: ECOG PERFORMANCE STATUS: 1 - Symptomatic but completely ambulatory  Vitals:   07/28/23 1016 07/28/23 1017  BP: (!) 172/89 (!) 142/80  Pulse: 64   Resp: 19   Temp: 98.3 F (36.8 C)   SpO2: 100%    Wt Readings from Last 3 Encounters:  07/28/23 174 lb 1.6 oz (79 kg)  07/14/23 171 lb 9.6 oz (77.8 kg)  06/30/23 175 lb 3.2 oz (79.5 kg)     GENERAL:alert, no distress and comfortable SKIN: skin color, texture, turgor are normal, no rashes. (+) Skin hyperpigmentation with a small ulcer at the port site (see picture below) EYES: normal, Conjunctiva are pink and non-injected, sclera clear NECK: supple, thyroid normal size, non-tender, without nodularity LYMPH:  no palpable lymphadenopathy in the cervical, axillary  LUNGS: clear to auscultation and percussion with normal breathing effort HEART: regular rate & rhythm and no murmurs and no lower extremity edema ABDOMEN:abdomen soft, non-tender and  normal bowel sounds Musculoskeletal:no cyanosis of digits and no clubbing  NEURO: alert & oriented x 3 with fluent speech, no focal motor/sensory deficits  Physical Exam    LABORATORY DATA:  I have reviewed the data as listed    Latest Ref Rng & Units 07/28/2023    9:47 AM 07/14/2023    9:08 AM 06/30/2023    9:29 AM  CBC  WBC 4.0 - 10.5 K/uL 16.3  12.0  17.3   Hemoglobin 12.0 - 15.0 g/dL 16.1  09.6  04.5   Hematocrit 36.0 - 46.0 % 37.0  35.5  36.4   Platelets 150 - 400 K/uL 292  293  286         Latest Ref Rng & Units 07/28/2023    9:47 AM 07/14/2023    9:08 AM 07/09/2023   10:31 AM  CMP  Glucose 70 - 99 mg/dL 409  811    BUN 8 - 23 mg/dL 10  8    Creatinine 9.14 - 1.00 mg/dL 7.82  9.56    Sodium 213 - 145 mmol/L 140  140    Potassium 3.5 - 5.1 mmol/L 4.0  3.7  Chloride 98 - 111 mmol/L 104  103    CO2 22 - 32 mmol/L 28  29    Calcium  8.9 - 10.3 mg/dL 9.7  9.4    Total Protein 6.5 - 8.1 g/dL 7.6  7.3  7.2   Total Bilirubin 0.0 - 1.2 mg/dL 0.3  0.3  <9.6   Alkaline Phos 38 - 126 U/L 198  181  249   AST 15 - 41 U/L 20  18  14    ALT 0 - 44 U/L 18  15  14        RADIOGRAPHIC STUDIES: I have personally reviewed the radiological images as listed and agreed with the findings in the report. No results found.    Orders Placed This Encounter  Procedures   IR Removal Tun Access W/ Port W/O FL    Need port removal d/t port infections and possible PICC replacement in the interim    Standing Status:   Future    Expected Date:   07/29/2023    Expiration Date:   07/27/2024    Reason for exam::   RT Chest port-a-cath removal d/t infection    Preferred Imaging Location?:   [facility]   CBC with Differential (Cancer Center Only)    Standing Status:   Future    Expected Date:   08/04/2023    Expiration Date:   08/03/2024   CMP (Cancer Center only)    Standing Status:   Future    Expected Date:   08/04/2023    Expiration Date:   08/03/2024   Total Protein, Urine dipstick     Standing Status:   Future    Expected Date:   08/04/2023    Expiration Date:   08/03/2024   CBC with Differential (Cancer Center Only)    Standing Status:   Future    Expected Date:   09/01/2023    Expiration Date:   08/31/2024   CMP (Cancer Center only)    Standing Status:   Future    Expected Date:   09/01/2023    Expiration Date:   08/31/2024   Total Protein, Urine dipstick    Standing Status:   Future    Expected Date:   09/01/2023    Expiration Date:   08/31/2024   All questions were answered. The patient knows to call the clinic with any problems, questions or concerns. No barriers to learning was detected. The total time spent in the appointment was 30 minutes, including review of chart and various tests results, discussions about plan of care and coordination of care plan     Sonja Prescott, MD 07/28/2023

## 2023-07-29 ENCOUNTER — Telehealth: Payer: Self-pay | Admitting: Hematology

## 2023-07-29 ENCOUNTER — Other Ambulatory Visit: Payer: Self-pay | Admitting: Hematology

## 2023-07-29 ENCOUNTER — Other Ambulatory Visit: Payer: Self-pay | Admitting: Radiology

## 2023-07-29 DIAGNOSIS — C221 Intrahepatic bile duct carcinoma: Secondary | ICD-10-CM

## 2023-07-30 ENCOUNTER — Other Ambulatory Visit: Payer: Self-pay

## 2023-07-30 ENCOUNTER — Ambulatory Visit (HOSPITAL_COMMUNITY)
Admission: RE | Admit: 2023-07-30 | Discharge: 2023-07-30 | Disposition: A | Discharge status: Home / Self Care | Payer: Medicare (Managed Care) | Source: Ambulatory Visit | Attending: Hematology | Admitting: Hematology

## 2023-07-30 ENCOUNTER — Other Ambulatory Visit: Payer: Self-pay | Admitting: Hematology

## 2023-07-30 DIAGNOSIS — T80212A Local infection due to central venous catheter, initial encounter: Secondary | ICD-10-CM | POA: Insufficient documentation

## 2023-07-30 DIAGNOSIS — T451X5A Adverse effect of antineoplastic and immunosuppressive drugs, initial encounter: Secondary | ICD-10-CM | POA: Diagnosis not present

## 2023-07-30 DIAGNOSIS — G620 Drug-induced polyneuropathy: Secondary | ICD-10-CM

## 2023-07-30 DIAGNOSIS — C221 Intrahepatic bile duct carcinoma: Secondary | ICD-10-CM

## 2023-07-30 HISTORY — PX: IR REMOVAL TUN ACCESS W/ PORT W/O FL MOD SED: IMG2290

## 2023-07-30 MED ORDER — HEPARIN SOD (PORK) LOCK FLUSH 100 UNIT/ML IV SOLN
INTRAVENOUS | Status: AC
  Filled 2023-07-30: qty 5

## 2023-07-30 MED ORDER — LIDOCAINE HCL 1 % IJ SOLN
INTRAMUSCULAR | Status: AC
  Filled 2023-07-30: qty 20

## 2023-07-30 MED ORDER — LIDOCAINE HCL 1 % IJ SOLN: 20.0000 mL | Freq: Once | INTRAMUSCULAR | Status: DC

## 2023-07-30 MED ORDER — LIDOCAINE-EPINEPHRINE 1 %-1:100000 IJ SOLN
INTRAMUSCULAR | Status: AC
  Filled 2023-07-30: qty 1

## 2023-07-30 NOTE — Procedures (Signed)
 PROCEDURE SUMMARY:  Successful placement of image-guided single lumen PICC line to the left brachial vein. Length 42 cm. Tip at lower SVC/RA. No complications. EBL = < 3 ml. Ready for use.  Please see imaging section of Epic for full dictation.   Fawn Hooks, NP 07/30/2023 12:02 PM

## 2023-08-03 NOTE — Assessment & Plan Note (Signed)
 MSS, [name]/[name] wildtype -diagnosed 12/2019 by porta hepatis LN biopsy during EUS for work up of abdominal pain and liver lesions seen on US  and MRI. Liver biopsy 01/08/20 confirmed metastasis from primary colorectal cancer. PET scan showed hypermetabolism to known liver mets, diffuse thoracic and abdominal lymphadenopathy, a 1.8 cm LUL pulmonary nodule, and splenic flexure of colon. -treated with first line FOLFOX 01/25/20 - 10/02/20, Vectibix  added with C2. Oxali discontinued after C18 due to reaction. -switched to Xeloda  10/21/20, dose adjusted due to significant skin toxicity -due to cancer progression, treatment has been changed to FOLFIRI and bevacizumab  on 03/25/22, she is overall tolerating well -will repeat CT scan after next cycle chemo  -Restaging CT scan from 06/16/2022 showed stable to mild decrease in size of treated liver metastasis. No new lesions -She is tolerating chemotherapy well overall, will continue -Her restaging CT scan on August 31, 2022 showed stable liver metastasis, and a stable 7 mm left lung nodule, indeterminate.  I personally reviewed the scan images with patient -Will continue current therapy.  We discussed the option of maintenance therapy with irinotecan  and bevacizumab  in future.  She is tolerating current FOLFIRI and bevacizumab  well, will continue. -Restaging CT 03/24/2023 showed stable disease.  -restaging CT in 06/2023 showed mild disease progression in liver, plan to change Beva to vectibix 

## 2023-08-04 ENCOUNTER — Other Ambulatory Visit: Payer: Self-pay

## 2023-08-04 ENCOUNTER — Inpatient Hospital Stay (HOSPITAL_BASED_OUTPATIENT_CLINIC_OR_DEPARTMENT_OTHER): Payer: Medicare (Managed Care) | Admitting: Hematology

## 2023-08-04 ENCOUNTER — Inpatient Hospital Stay: Payer: Medicare (Managed Care)

## 2023-08-04 VITALS — BP 122/64 | HR 91 | Temp 97.3°F | Resp 19 | Ht 66.0 in | Wt 174.4 lb

## 2023-08-04 DIAGNOSIS — C221 Intrahepatic bile duct carcinoma: Secondary | ICD-10-CM

## 2023-08-04 DIAGNOSIS — Z95828 Presence of other vascular implants and grafts: Secondary | ICD-10-CM

## 2023-08-04 DIAGNOSIS — C189 Malignant neoplasm of colon, unspecified: Secondary | ICD-10-CM

## 2023-08-04 DIAGNOSIS — Z5111 Encounter for antineoplastic chemotherapy: Secondary | ICD-10-CM | POA: Diagnosis not present

## 2023-08-04 LAB — CBC WITH DIFFERENTIAL (CANCER CENTER ONLY)
Abs Immature Granulocytes: 0.04 10*3/uL (ref 0.00–0.07)
Basophils Absolute: 0.1 10*3/uL (ref 0.0–0.1)
Basophils Relative: 1 %
Eosinophils Absolute: 0.4 10*3/uL (ref 0.0–0.5)
Eosinophils Relative: 3 %
HCT: 34.9 % — ABNORMAL LOW (ref 36.0–46.0)
Hemoglobin: 11.2 g/dL — ABNORMAL LOW (ref 12.0–15.0)
Immature Granulocytes: 0 %
Lymphocytes Relative: 14 %
Lymphs Abs: 1.5 10*3/uL (ref 0.7–4.0)
MCH: 28.1 pg (ref 26.0–34.0)
MCHC: 32.1 g/dL (ref 30.0–36.0)
MCV: 87.5 fL (ref 80.0–100.0)
Monocytes Absolute: 0.7 10*3/uL (ref 0.1–1.0)
Monocytes Relative: 7 %
Neutro Abs: 8.1 10*3/uL — ABNORMAL HIGH (ref 1.7–7.7)
Neutrophils Relative %: 75 %
Platelet Count: 285 10*3/uL (ref 150–400)
RBC: 3.99 MIL/uL (ref 3.87–5.11)
RDW: 16.9 % — ABNORMAL HIGH (ref 11.5–15.5)
WBC Count: 10.8 10*3/uL — ABNORMAL HIGH (ref 4.0–10.5)
nRBC: 0 % (ref 0.0–0.2)

## 2023-08-04 LAB — CMP (CANCER CENTER ONLY)
ALT: 23 U/L (ref 0–44)
AST: 21 U/L (ref 15–41)
Albumin: 3.7 g/dL (ref 3.5–5.0)
Alkaline Phosphatase: 157 U/L — ABNORMAL HIGH (ref 38–126)
Anion gap: 9 (ref 5–15)
BUN: 13 mg/dL (ref 8–23)
CO2: 27 mmol/L (ref 22–32)
Calcium: 9.2 mg/dL (ref 8.9–10.3)
Chloride: 102 mmol/L (ref 98–111)
Creatinine: 0.77 mg/dL (ref 0.44–1.00)
GFR, Estimated: >60 mL/min (ref >60)
Glucose, Bld: 134 mg/dL — ABNORMAL HIGH (ref 70–99)
Potassium: 3.7 mmol/L (ref 3.5–5.1)
Sodium: 138 mmol/L (ref 135–145)
Total Bilirubin: 0.2 mg/dL (ref 0.0–1.2)
Total Protein: 7.5 g/dL (ref 6.5–8.1)

## 2023-08-04 LAB — TOTAL PROTEIN, URINE DIPSTICK: Protein, ur: NEGATIVE mg/dL

## 2023-08-04 MED ORDER — SODIUM CHLORIDE 0.9% FLUSH
10.0000 mL | Freq: Once | INTRAVENOUS | Status: AC
  Administered 2023-08-04: 10 mL

## 2023-08-04 MED ORDER — HEPARIN SOD (PORK) LOCK FLUSH 100 UNIT/ML IV SOLN
250.0000 [IU] | Freq: Once | INTRAVENOUS | Status: AC
  Administered 2023-08-04: 250 [IU]

## 2023-08-04 NOTE — Progress Notes (Signed)
 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 Liver biopsy 01/08/20 confirmed metastasis from primary colorectal cancer. PET scan showed hypermetabolism to known liver mets, diffuse thoracic and abdominal lymphadenopathy, a 1.8 cm LUL pulmonary nodule, and splenic flexure of colon. -treated with first line FOLFOX 01/25/20 - 10/02/20, Vectibix  added with C2. Oxali discontinued after C18 due to reaction. -switched to Xeloda  10/21/20, dose adjusted due to significant skin toxicity -due to cancer progression, treatment has been changed to FOLFIRI and bevacizumab  on 03/25/22, she is overall tolerating well -will repeat CT scan after next cycle chemo  -Restaging CT scan from 06/16/2022 showed stable to mild decrease in size of treated liver metastasis. No new lesions -She is tolerating chemotherapy well overall, will continue -Her restaging CT scan on August 31, 2022 showed stable liver metastasis, and a stable 7 mm left lung nodule, indeterminate.  I personally reviewed the scan images with patient -Will continue current therapy.  We discussed the option of maintenance therapy with irinotecan  and bevacizumab  in future.  She is tolerating current FOLFIRI and bevacizumab  well, will continue. -Restaging CT 03/24/2023 showed stable disease.   -restaging CT in 06/2023 showed mild disease progression in liver, plan to change Beva to vectibix    Assessment & Plan Metastatic colon cancer Metastatic colon cancer with recent port removal. The wound is healing but not fully closed. Post-removal bleeding issues were resolved. A PICC line is in place temporarily. Plan to switch from bevacizumab  to Vectibix , previously well-tolerated. Vectibix  may cause rash and diarrhea, previously managed well. PICC line requires weekly dressing changes and flushes, less convenient than the port. - Switch from bevacizumab  to Vectibix  starting next week - Schedule PICC line flush for June 4th - Monitor wound healing and consider port re-insertion once healed - Instruct weekly PICC line dressing changes - Advise contacting primary care physician for medication refills  Port infection - Noticed on exam last week, port was removed by IR. - She has completed a course of doxycycline   Plan - Will change her chemo to FOLFIRI and Vectibix , plan to start next week - Continue PICC line dressing change weekly   SUMMARY OF ONCOLOGIC HISTORY: Oncology History  metastatic colon cancer to liver  06/20/2019 Procedure   Colonoscopy by Dr Brice Campi 06/20/19  IMPRESSION -Seven 3 to 10 mm polyps in the sigmoid colon, in the transverse colon and in the escending colon, removed with a cold snare. Resected and retrireved.  -One 20mm polyp in the descending colon. Biopsies. Tattoes.  -Mediaum sized lipoma in the ascending colon.   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 See Comment B. Colon, Ascending, polyp, Polypectomy:  -TUBULAR ADENOMA -No high grade dysplasia or malignancy.  C. Colon, TRansverse, Polyo, polectomy:  -TUBULAR ADENOMA -No high grade dysplasia or malignancy.  D. Colon, Sigmoid, Polyp,  Polypectomy:  -HYPERPLASTIC POLYP   11/07/2019 Imaging   US  Abdomen 11/07/19   IMPRESSION: 1. Two solid masses in the liver are nonspecific. Recommend MRI abdomen with and without contrast for further evaluation.   12/15/2019 Imaging   MRI Abdomen 12/15/19  IMPRESSION: 1. There are two large masses in the liver with appearance favoring metastatic disease or hepatocellular carcinoma or cholangiocarcinoma. A benign etiology is highly unlikely given the enhancement pattern and associated adenopathy. 2. Considerable porta hepatis and retroperitoneal adenopathy. Some of the confluent porta hepatis tumor is potentially infiltrative and abuts the pancreatic body along its right upper margin, making it difficult to completely exclude the possibility of pancreatic adenocarcinoma primary. Possibilities helpful in further workup might include tissue diagnosis, endoscopic ultrasound, or nuclear medicine PET-CT. 3. Pancreas divisum. 4. Lumbar spondylosis and degenerative disc disease. 5. Despite efforts by the technologist and patient, motion artifact is present on today's exam and could not be eliminated. This reduces exam sensitivity and specificity.   12/22/2019 Procedure   Upper Endoscopy by Dr Nickey Barn 12/22/19  IMPRESSION - One lymph node was visualized and measured in the porta hepatis region. Fine needle aspiration performed    12/22/2019 Initial Biopsy   A. LIVER, PORTA HEPATIS MASS, FINE NEEDLE 12/22/19 ASPIRATION:  FINAL MICROSCOPIC DIAGNOSIS:  - Malignant cells consistent with metastatic adenocarcinoma   01/01/2020 Initial Diagnosis   Intrahepatic cholangiocarcinoma (HCC)   01/08/2020 Initial Biopsy   FINAL MICROSCOPIC DIAGNOSIS:   A. LIVER, LEFT LOBE, BIOPSY:  - Metastatic adenocarcinoma, consistent with a colorectal primary.  See  comment      COMMENT:   Immunohistochemical stains show the tumor cells are positive for CK20  and CDX2 but negative for CK7, consistent with above interpretation.  Dr. Maryalice Smaller was notified on 01/10/2020   01/08/2020  Genetic Testing   [name]  [name] wildtype and [name]/[name] mutations which make her eligible for target biological agent Vectibix .   01/24/2020 Procedure   PAC placed 01/24/20   01/25/2020 -  Chemotherapy   first line FOLFOX starting 01/25/2020, Vextibix  added with C2 (02/06/20)   02/06/2020 - 02/06/2022 Chemotherapy   Patient is on Treatment Plan : COLORECTAL Panitumumab  q14d ([name] Wild - Type Gene Only)     06/20/2020 Imaging   CT CAP  IMPRESSION: 1. Continued interval reduction in size and conspicuity of a subsolid nodule of the peripheral left upper lobe. 2. Unchanged prominent pretracheal and subcarinal lymph nodes. 3. Redemonstrated partially calcified low-attenuation liver masses, slightly decreased in size compared to prior examination. 4. Slight interval decrease in size of a portacaval lymph node or conglomerate and retroperitoneal lymph nodes. 5. Findings are consistent with continued treatment response of nodal, pulmonary, and hepatic metastatic disease. 6. Coronary artery disease.   Aortic Atherosclerosis (ICD10-I70.0).     10/09/2020 Imaging   IMPRESSION: 1. Slight decrease in size of dominant liver mass with smaller liver mass within 1-2 mm of prior measurement. 2. Stable appearance of celiac lymph and retroperitoneal lymph nodes. Dominant node with calcification in the gastrohepatic ligament as described. 3. Continued decrease in size of LEFT upper lobe nodule. 4. Three-vessel coronary artery calcification. 5. Aortic atherosclerosis.   03/25/2022 -  Chemotherapy   Patient is on Treatment Plan : COLORECTAL FOLFIRI + Bevacizumab  q14d     08/31/2022 Imaging    IMPRESSION: 1. Unchanged, densely calcified liver lesions, consistent with treated metastatic disease. 2. Unchanged, irregular subpleural opacity of the peripheral left upper lobe. 3. Unchanged subcentimeter gastrohepatic ligament lymph node. 4. No  evidence of new metastatic disease in the chest,  abdomen, or pelvis. 5. Status post hysterectomy. 6. Coronary artery disease. 7. Aortic valve calcifications. Correlate for echocardiographic evidence of aortic valve dysfunction.     06/22/2023 Imaging   CT Chest, Abdomen, and pelvis with contrast  IMPRESSION: Centrally calcified liver lesions are again seen and slightly increased today. Prominent nodes in the upper retroperitoneum are overall similar. Attention on follow-up.   Stable branching left upper lobe lung nodule.      Discussed the use of AI scribe software for clinical note transcription with the patient, who gave verbal consent to proceed.  History of Present Illness Lindsay WHITELAW is a 68 year old female with metastatic colon cancer who presents for follow-up.  Her port was removed last Friday, and the wound is healing but not fully closed. There was difficulty in stopping bleeding post-removal, necessitating a return visit for intervention. She experiences occasional itching at the site but no pain.  A PICC line has been placed, which she finds less convenient due to weekly dressing changes. Her dressing was changed today, with plans for another change next Wednesday.  She is on aspirin  81 mg, a cholesterol medication, and a blood thinner for her heart. She recently completed a course of doxycycline . No fever or chills are present.     All other systems were reviewed with the patient and are negative.  MEDICAL HISTORY:  Past Medical History:  Diagnosis Date   Arthritis    Diabetes mellitus without complication (HCC)    Hypertension    met colon ca to liver 01/2020    SURGICAL HISTORY: Past Surgical History:  Procedure Laterality Date   ABDOMINAL HYSTERECTOMY     ANTERIOR AND POSTERIOR REPAIR WITH SACROSPINOUS FIXATION N/A 07/16/2016   Procedure: ANTERIOR AND POSTERIOR REPAIR WITH SACROSPINOUS FIXATION;  Surgeon: Thora Flint, MD;  Location: [facility];  Service: Gynecology;  Laterality: N/A;   CYSTOSCOPY   07/16/2016   Procedure: CYSTOSCOPY;  Surgeon: Thora Flint, MD;  Location: [facility];  Service: Gynecology;;   ESOPHAGOGASTRODUODENOSCOPY (EGD) WITH PROPOFOL  N/A 12/22/2019   Procedure: ESOPHAGOGASTRODUODENOSCOPY (EGD) WITH PROPOFOL ;  Surgeon: Alvis Jourdain, MD;  Location: [facility];  Service: Endoscopy;  Laterality: N/A;   FINE NEEDLE ASPIRATION N/A 12/22/2019   Procedure: FINE NEEDLE ASPIRATION (FNA) LINEAR;  Surgeon: Alvis Jourdain, MD;  Location: [facility];  Service: Endoscopy;  Laterality: N/A;   IR IMAGING GUIDED PORT INSERTION  01/24/2020   IR REMOVAL TUN ACCESS W/ PORT W/O FL MOD SED  07/30/2023   LAPAROSCOPIC VAGINAL HYSTERECTOMY WITH SALPINGECTOMY Bilateral 07/16/2016   Procedure: LAPAROSCOPIC ASSISTED VAGINAL HYSTERECTOMY WITH SALPINGECTOMY;  Surgeon: Thora Flint, MD;  Location: [facility];  Service: Gynecology;  Laterality: Bilateral;   LEFT HEART CATH AND CORONARY ANGIOGRAPHY N/A 05/07/2023   Procedure: LEFT HEART CATH AND CORONARY ANGIOGRAPHY;  Surgeon: Arleen Lacer, MD;  Location: [facility] CV [facility];  Service: Cardiovascular;  Laterality: N/A;   MYOMECTOMY ABDOMINAL APPROACH     THYROID SURGERY     tyroid     UPPER ESOPHAGEAL ENDOSCOPIC ULTRASOUND (EUS) N/A 12/22/2019   Procedure: UPPER ESOPHAGEAL ENDOSCOPIC ULTRASOUND (EUS);  Surgeon: Alvis Jourdain, MD;  Location: [facility];  Service: Endoscopy;  Laterality: N/A;    I have reviewed the social history and family history with the patient and they are unchanged from previous note.  ALLERGIES:  is allergic to oxaliplatin .  MEDICATIONS:  Current Outpatient Medications  Medication Sig Dispense Refill   aspirin  EC 81 MG  tablet Take 1 tablet (81 mg total) by mouth daily. Swallow whole. 90 tablet 0   atorvastatin  (LIPITOR) 80 MG tablet Take 1 tablet (80 mg total) by mouth daily. 90 tablet 0   clopidogrel  (PLAVIX ) 75 MG tablet Take 1 tablet (75 mg total) by mouth daily. 90 tablet 0   doxycycline  (VIBRA -TABS) 100 MG tablet  Take 1 tablet (100 mg total) by mouth 2 (two) times daily. 14 tablet 0   KLOR-CON  M20 20 MEQ tablet TAKE 1 TABLET TWICE A DAY 180 tablet 3   metoprolol  tartrate (LOPRESSOR ) 25 MG tablet Take 0.5 tablets (12.5 mg total) by mouth 2 (two) times daily. 30 tablet 2   pantoprazole  (PROTONIX ) 20 MG tablet Take 1 tablet (20 mg total) by mouth daily. 30 tablet 1   No current facility-administered medications for this visit.    PHYSICAL EXAMINATION: ECOG PERFORMANCE STATUS: 1 - Symptomatic but completely ambulatory  Vitals:   08/04/23 1155 08/04/23 1158  BP: (!) 150/96 122/64  Pulse: 91   Resp: 19   Temp: (!) 97.3 F (36.3 C)   SpO2: 98%    Wt Readings from Last 3 Encounters:  08/04/23 174 lb 6.4 oz (79.1 kg)  07/28/23 174 lb 1.6 oz (79 kg)  07/14/23 171 lb 9.6 oz (77.8 kg)     GENERAL:alert, no distress and comfortable SKIN: skin color, texture, turgor are normal, no rashes or significant lesions, except the wound after her port removal, is near closed, without discharge.  No tenderness. EYES: normal, Conjunctiva are pink and non-injected, sclera clear NECK: supple, thyroid normal size, non-tender, without nodularity LYMPH:  no palpable lymphadenopathy in the cervical, axillary  LUNGS: clear to auscultation and percussion with normal breathing effort HEART: regular rate & rhythm and no murmurs and no lower extremity edema ABDOMEN:abdomen soft, non-tender and normal bowel sounds Musculoskeletal:no cyanosis of digits and no clubbing  NEURO: alert & oriented x 3 with fluent speech, no focal motor/sensory deficits  Physical Exam   LABORATORY DATA:  I have reviewed the data as listed    Latest Ref Rng & Units 08/04/2023   11:13 AM 07/28/2023    9:47 AM 07/14/2023    9:08 AM  CBC  WBC 4.0 - 10.5 K/uL 10.8  16.3  12.0   Hemoglobin 12.0 - 15.0 g/dL 57.8  46.9  62.9   Hematocrit 36.0 - 46.0 % 34.9  37.0  35.5   Platelets 150 - 400 K/uL 285  292  293         Latest Ref Rng & Units  08/04/2023   11:13 AM 07/28/2023    9:47 AM 07/14/2023    9:08 AM  CMP  Glucose 70 - 99 mg/dL 528  413  244   BUN 8 - 23 mg/dL 13  10  8    Creatinine 0.44 - 1.00 mg/dL 0.10  2.72  5.36   Sodium 135 - 145 mmol/L 138  140  140   Potassium 3.5 - 5.1 mmol/L 3.7  4.0  3.7   Chloride 98 - 111 mmol/L 102  104  103   CO2 22 - 32 mmol/L 27  28  29    Calcium  8.9 - 10.3 mg/dL 9.2  9.7  9.4   Total Protein 6.5 - 8.1 g/dL 7.5  7.6  7.3   Total Bilirubin 0.0 - 1.2 mg/dL 0.2  0.3  0.3   Alkaline Phos 38 - 126 U/L 157  198  181   AST 15 - 41 U/L 21  20  18   ALT 0 - 44 U/L 23  18  15        RADIOGRAPHIC STUDIES: I have personally reviewed the radiological images as listed and agreed with the findings in the report. No results found.    No orders of the defined types were placed in this encounter.  All questions were answered. The patient knows to call the clinic with any problems, questions or concerns. No barriers to learning was detected. The total time spent in the appointment was 25 minutes, including review of chart and various tests results, discussions about plan of care and coordination of care plan     Sonja Porter, MD 08/04/2023

## 2023-08-05 ENCOUNTER — Other Ambulatory Visit: Payer: Self-pay

## 2023-08-06 ENCOUNTER — Other Ambulatory Visit: Payer: Self-pay

## 2023-08-11 ENCOUNTER — Inpatient Hospital Stay: Payer: Medicare (Managed Care)

## 2023-08-11 ENCOUNTER — Telehealth: Payer: Self-pay

## 2023-08-11 ENCOUNTER — Other Ambulatory Visit: Payer: Medicare (Managed Care)

## 2023-08-11 ENCOUNTER — Telehealth: Payer: Self-pay | Admitting: Nurse Practitioner

## 2023-08-11 ENCOUNTER — Other Ambulatory Visit: Payer: Self-pay

## 2023-08-11 ENCOUNTER — Inpatient Hospital Stay: Payer: Medicare (Managed Care) | Attending: Hematology | Admitting: Physician Assistant

## 2023-08-11 ENCOUNTER — Ambulatory Visit: Payer: Medicare (Managed Care) | Admitting: Nurse Practitioner

## 2023-08-11 ENCOUNTER — Inpatient Hospital Stay: Payer: Medicare (Managed Care) | Attending: Hematology

## 2023-08-11 VITALS — BP 154/93 | HR 84 | Resp 20

## 2023-08-11 VITALS — BP 135/78 | HR 56 | Temp 97.6°F | Resp 17 | Ht 66.0 in | Wt 172.7 lb

## 2023-08-11 DIAGNOSIS — Z79899 Other long term (current) drug therapy: Secondary | ICD-10-CM | POA: Insufficient documentation

## 2023-08-11 DIAGNOSIS — Z90710 Acquired absence of both cervix and uterus: Secondary | ICD-10-CM | POA: Insufficient documentation

## 2023-08-11 DIAGNOSIS — G620 Drug-induced polyneuropathy: Secondary | ICD-10-CM | POA: Insufficient documentation

## 2023-08-11 DIAGNOSIS — Z5111 Encounter for antineoplastic chemotherapy: Secondary | ICD-10-CM

## 2023-08-11 DIAGNOSIS — C189 Malignant neoplasm of colon, unspecified: Secondary | ICD-10-CM | POA: Diagnosis not present

## 2023-08-11 DIAGNOSIS — C7800 Secondary malignant neoplasm of unspecified lung: Secondary | ICD-10-CM | POA: Insufficient documentation

## 2023-08-11 DIAGNOSIS — C19 Malignant neoplasm of rectosigmoid junction: Secondary | ICD-10-CM | POA: Insufficient documentation

## 2023-08-11 DIAGNOSIS — Z452 Encounter for adjustment and management of vascular access device: Secondary | ICD-10-CM | POA: Diagnosis not present

## 2023-08-11 DIAGNOSIS — Z95828 Presence of other vascular implants and grafts: Secondary | ICD-10-CM

## 2023-08-11 DIAGNOSIS — C787 Secondary malignant neoplasm of liver and intrahepatic bile duct: Secondary | ICD-10-CM | POA: Insufficient documentation

## 2023-08-11 DIAGNOSIS — Z5112 Encounter for antineoplastic immunotherapy: Secondary | ICD-10-CM | POA: Insufficient documentation

## 2023-08-11 DIAGNOSIS — T451X5D Adverse effect of antineoplastic and immunosuppressive drugs, subsequent encounter: Secondary | ICD-10-CM | POA: Diagnosis not present

## 2023-08-11 DIAGNOSIS — C221 Intrahepatic bile duct carcinoma: Secondary | ICD-10-CM

## 2023-08-11 LAB — CBC WITH DIFFERENTIAL (CANCER CENTER ONLY)
Abs Immature Granulocytes: 0.03 10*3/uL (ref 0.00–0.07)
Basophils Absolute: 0.1 10*3/uL (ref 0.0–0.1)
Basophils Relative: 1 %
Eosinophils Absolute: 0.5 10*3/uL (ref 0.0–0.5)
Eosinophils Relative: 6 %
HCT: 33.7 % — ABNORMAL LOW (ref 36.0–46.0)
Hemoglobin: 10.8 g/dL — ABNORMAL LOW (ref 12.0–15.0)
Immature Granulocytes: 0 %
Lymphocytes Relative: 17 %
Lymphs Abs: 1.6 10*3/uL (ref 0.7–4.0)
MCH: 28 pg (ref 26.0–34.0)
MCHC: 32 g/dL (ref 30.0–36.0)
MCV: 87.3 fL (ref 80.0–100.0)
Monocytes Absolute: 0.9 10*3/uL (ref 0.1–1.0)
Monocytes Relative: 10 %
Neutro Abs: 6 10*3/uL (ref 1.7–7.7)
Neutrophils Relative %: 66 %
Platelet Count: 284 10*3/uL (ref 150–400)
RBC: 3.86 MIL/uL — ABNORMAL LOW (ref 3.87–5.11)
RDW: 17.3 % — ABNORMAL HIGH (ref 11.5–15.5)
WBC Count: 9.1 10*3/uL (ref 4.0–10.5)
nRBC: 0 % (ref 0.0–0.2)

## 2023-08-11 LAB — CMP (CANCER CENTER ONLY)
ALT: 30 U/L (ref 0–44)
AST: 25 U/L (ref 15–41)
Albumin: 4.1 g/dL (ref 3.5–5.0)
Alkaline Phosphatase: 149 U/L — ABNORMAL HIGH (ref 38–126)
Anion gap: 6 (ref 5–15)
BUN: 10 mg/dL (ref 8–23)
CO2: 29 mmol/L (ref 22–32)
Calcium: 9.5 mg/dL (ref 8.9–10.3)
Chloride: 103 mmol/L (ref 98–111)
Creatinine: 0.59 mg/dL (ref 0.44–1.00)
GFR, Estimated: >60 mL/min (ref >60)
Glucose, Bld: 127 mg/dL — ABNORMAL HIGH (ref 70–99)
Potassium: 4.1 mmol/L (ref 3.5–5.1)
Sodium: 138 mmol/L (ref 135–145)
Total Bilirubin: 0.4 mg/dL (ref 0.0–1.2)
Total Protein: 7.7 g/dL (ref 6.5–8.1)

## 2023-08-11 LAB — TOTAL PROTEIN, URINE DIPSTICK: Protein, ur: NEGATIVE mg/dL

## 2023-08-11 LAB — MAGNESIUM: Magnesium: 1.9 mg/dL (ref 1.7–2.4)

## 2023-08-11 MED ORDER — PALONOSETRON HCL INJECTION 0.25 MG/5ML
0.2500 mg | Freq: Once | INTRAVENOUS | Status: AC
  Administered 2023-08-11: 0.25 mg via INTRAVENOUS
  Filled 2023-08-11: qty 5

## 2023-08-11 MED ORDER — SODIUM CHLORIDE 0.9 % IV SOLN
6.0000 mg/kg | Freq: Once | INTRAVENOUS | Status: AC
  Administered 2023-08-11: 500 mg via INTRAVENOUS
  Filled 2023-08-11: qty 5

## 2023-08-11 MED ORDER — SODIUM CHLORIDE 0.9 % IV SOLN
400.0000 mg/m2 | Freq: Once | INTRAVENOUS | Status: AC
  Administered 2023-08-11: 768 mg via INTRAVENOUS
  Filled 2023-08-11: qty 25

## 2023-08-11 MED ORDER — CLINDAMYCIN PHOSPHATE 1 % EX LOTN: TOPICAL_LOTION | CUTANEOUS | 0 refills | Status: AC | PRN

## 2023-08-11 MED ORDER — HEPARIN SOD (PORK) LOCK FLUSH 100 UNIT/ML IV SOLN
250.0000 [IU] | Freq: Once | INTRAVENOUS | Status: AC
  Administered 2023-08-11: 250 [IU]

## 2023-08-11 MED ORDER — FAMOTIDINE IN NACL 20-0.9 MG/50ML-% IV SOLN
INTRAVENOUS | Status: AC
Start: 2023-08-11 — End: 2023-08-11
  Filled 2023-08-11: qty 50

## 2023-08-11 MED ORDER — ATROPINE SULFATE 1 MG/ML IV SOLN
0.5000 mg | Freq: Once | INTRAVENOUS | Status: AC | PRN
  Administered 2023-08-11: 0.5 mg via INTRAVENOUS
  Filled 2023-08-11: qty 1

## 2023-08-11 MED ORDER — SODIUM CHLORIDE 0.9 % IV SOLN
150.0000 mg/m2 | Freq: Once | INTRAVENOUS | Status: AC
  Administered 2023-08-11: 300 mg via INTRAVENOUS
  Filled 2023-08-11: qty 15

## 2023-08-11 MED ORDER — DOXYCYCLINE HYCLATE 100 MG PO TABS: 100.0000 mg | ORAL_TABLET | Freq: Two times a day (BID) | ORAL | 3 refills | Status: AC

## 2023-08-11 MED ORDER — SODIUM CHLORIDE 0.9 % IV SOLN
2000.0000 mg/m2 | INTRAVENOUS | Status: DC
  Administered 2023-08-11: 3500 mg via INTRAVENOUS
  Filled 2023-08-11: qty 70

## 2023-08-11 MED ORDER — FAMOTIDINE IN NACL 20-0.9 MG/50ML-% IV SOLN
20.0000 mg | Freq: Once | INTRAVENOUS | Status: AC | PRN
  Administered 2023-08-11: 20 mg via INTRAVENOUS

## 2023-08-11 MED ORDER — SODIUM CHLORIDE 0.9% FLUSH
10.0000 mL | Freq: Once | INTRAVENOUS | Status: AC
  Administered 2023-08-11: 10 mL

## 2023-08-11 MED ORDER — DEXAMETHASONE SODIUM PHOSPHATE 10 MG/ML IJ SOLN
10.0000 mg | Freq: Once | INTRAMUSCULAR | Status: AC
  Administered 2023-08-11: 10 mg via INTRAVENOUS
  Filled 2023-08-11: qty 1

## 2023-08-11 MED ORDER — SODIUM CHLORIDE 0.9 % IV SOLN: INTRAVENOUS | Status: DC

## 2023-08-11 NOTE — Telephone Encounter (Signed)
 The patient was seen while in infusion suite today. During her chemotherapy, she had an episode of chest/epigastric discomfort. Her blood pressure was elevated to 170/110. Her chemotherapy was paused. She was given IV pepcid , as she described the pain as gas. This did not help. We obtained an EKG which was abnormal. It showed normal sinus rhythm at 86 bpm. There is evidence of left ventricular hypertrophy and repolarization abnormality. She was able to use the bathroom and rest for a few minutes. Pain ultimately resolved and she was able to complete her chemotherapy. She does not have a prescription for nitroglycerin  to take on as needed basis for chest pain and severe hypertension. She reported that she has an appointment on 08/23/2023. A staff message was sent to her cardiologist asking if the patient could be seen sooner than scheduled appointment due to new episode of chest pain and hypertension.  Rande Bushy, NP

## 2023-08-11 NOTE — Telephone Encounter (Signed)
 Please arrange 1st available appointment with a DOD doctor. Thank you.

## 2023-08-11 NOTE — Telephone Encounter (Signed)
-----   Message from Jude Norton sent at 08/11/2023  4:19 PM EDT ----- Not sure if she can get in to see anyone sooner than her appt with me on 6/16? Thanks!

## 2023-08-11 NOTE — Progress Notes (Signed)
 Infusion paused and atropine  given for for stomach cramping. Cramping resolved but pt reports feeling like she has "to burp". Unresolved w/ pt's simethicone.Rande Bushy notified along w/ Dr. Maryalice Smaller. Pepcid  given per [facility]. Indigestion unresolved. EKG obtained. Pt began to feel better. Heather at bedside for pt assessment. Infusion restarted. Pt denies further symptoms of indigestion.  Dr. Maryalice Smaller assessed  patient at the bedside. Pt finished the duration of the infusion without incident. Pt denies pain and VSS upon discharge.

## 2023-08-11 NOTE — Progress Notes (Signed)
 [facility]   [phone] [phone]   Clinic Follow up Note   Patient Care Team: [name], MD as PCP - General (Internal Medicine) [name], [name], MD (Inactive) as PCP - Cardiology (Cardiology) Berna Breslow, RN (Inactive) as Oncology Nurse Navigator Sonja [place], MD as Consulting Physician (Oncology) Alvis Jourdain, MD as Consulting Physician (Gastroenterology)  Date of Service:  08/11/2023  CHIEF COMPLAINT: f/u of metastatic colon cancer  CURRENT THERAPY:  FOLFIRI and vectibix   SUMMARY OF ONCOLOGIC HISTORY: Oncology History  metastatic colon cancer to liver  06/20/2019 Procedure   Colonoscopy by Dr Brice Campi 06/20/19  IMPRESSION -Seven 3 to 10 mm polyps in the sigmoid colon, in the transverse colon and in the escending colon, removed with a cold snare. Resected and retrireved.  -One 20mm polyp in the descending colon. Biopsies. Tattoes.  -Mediaum sized lipoma in the ascending colon.   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 See Comment B. Colon, Ascending, polyp, Polypectomy:  -TUBULAR ADENOMA -No high grade dysplasia or malignancy.  C. Colon, TRansverse, Polyo, polectomy:  -TUBULAR ADENOMA -No high grade dysplasia or malignancy.  D. Colon, Sigmoid, Polyp, Polypectomy:  -HYPERPLASTIC POLYP   11/07/2019 Imaging   US  Abdomen 11/07/19  IMPRESSION: 1. Two solid masses in the liver are nonspecific. Recommend MRI abdomen with and without contrast for further evaluation.   12/15/2019 Imaging   MRI Abdomen 12/15/19  IMPRESSION: 1. There are two large masses in the liver with appearance favoring metastatic disease or hepatocellular carcinoma or cholangiocarcinoma. A benign etiology is highly unlikely given the enhancement pattern and associated adenopathy. 2. Considerable porta hepatis and retroperitoneal adenopathy. Some of the confluent porta hepatis  tumor is potentially infiltrative and abuts the pancreatic body along its right upper margin, making it difficult to completely exclude the possibility of pancreatic adenocarcinoma primary. Possibilities helpful in further workup might include tissue diagnosis, endoscopic ultrasound, or nuclear medicine PET-CT. 3. Pancreas divisum. 4. Lumbar spondylosis and degenerative disc disease. 5. Despite efforts by the technologist and patient, motion artifact is present on today's exam and could not be eliminated. This reduces exam sensitivity and specificity.   12/22/2019 Procedure   Upper Endoscopy by Dr Nickey Barn 12/22/19  IMPRESSION - One lymph node was visualized and measured in the porta hepatis region. Fine needle aspiration performed    12/22/2019 Initial Biopsy   A. LIVER, PORTA HEPATIS MASS, FINE NEEDLE 12/22/19 ASPIRATION:  FINAL MICROSCOPIC DIAGNOSIS:  - Malignant cells consistent with metastatic adenocarcinoma   01/01/2020 Initial Diagnosis   Intrahepatic cholangiocarcinoma (HCC)   01/08/2020 Initial Biopsy   FINAL MICROSCOPIC DIAGNOSIS:   A. LIVER, LEFT LOBE, BIOPSY:  - Metastatic adenocarcinoma, consistent with a colorectal primary.  See  comment      COMMENT:   Immunohistochemical stains show the tumor cells are positive for CK20  and CDX2 but negative for CK7, consistent with above interpretation.  Dr. Maryalice Smaller was notified on 01/10/2020   01/08/2020 Genetic Testing   [name]  [name] wildtype and [name]/[name] mutations which make her eligible for target biological agent Vectibix .   01/24/2020 Procedure   PAC placed 01/24/20   01/25/2020 -  Chemotherapy   first line FOLFOX starting 01/25/2020, Vextibix  added with C2 (02/06/20)   02/06/2020 - 02/06/2022 Chemotherapy   Patient is on Treatment Plan : COLORECTAL Panitumumab  q14d ([name] - Type Gene Only)     06/20/2020 Imaging   CT CAP  IMPRESSION:  1. Continued interval reduction in size and conspicuity of  a subsolid nodule of the peripheral left upper lobe. 2. Unchanged prominent pretracheal and subcarinal lymph nodes. 3. Redemonstrated partially calcified low-attenuation liver masses, slightly decreased in size compared to prior examination. 4. Slight interval decrease in size of a portacaval lymph node or conglomerate and retroperitoneal lymph nodes. 5. Findings are consistent with continued treatment response of nodal, pulmonary, and hepatic metastatic disease. 6. Coronary artery disease.   Aortic Atherosclerosis (ICD10-I70.0).     10/09/2020 Imaging   IMPRESSION: 1. Slight decrease in size of dominant liver mass with smaller liver mass within 1-2 mm of prior measurement. 2. Stable appearance of celiac lymph and retroperitoneal lymph nodes. Dominant node with calcification in the gastrohepatic ligament as described. 3. Continued decrease in size of LEFT upper lobe nodule. 4. Three-vessel coronary artery calcification. 5. Aortic atherosclerosis.   03/25/2022 - 07/16/2023 Chemotherapy   Patient is on Treatment Plan : COLORECTAL FOLFIRI + Bevacizumab  q14d     08/31/2022 Imaging    IMPRESSION: 1. Unchanged, densely calcified liver lesions, consistent with treated metastatic disease. 2. Unchanged, irregular subpleural opacity of the peripheral left upper lobe. 3. Unchanged subcentimeter gastrohepatic ligament lymph node. 4. No evidence of new metastatic disease in the chest, abdomen, or pelvis. 5. Status post hysterectomy. 6. Coronary artery disease. 7. Aortic valve calcifications. Correlate for echocardiographic evidence of aortic valve dysfunction.     06/22/2023 Imaging   CT Chest, Abdomen, and pelvis with contrast  IMPRESSION: Centrally calcified liver lesions are again seen and slightly increased today. Prominent nodes in the upper retroperitoneum are overall similar. Attention on follow-up.   Stable branching left upper lobe lung nodule.   08/11/2023 -  Chemotherapy    Patient is on Treatment Plan : COLORECTAL FOLFIRI + Panitumumab  q14d      INTERVAL HISTORY: Lindsay [name] is a 68 y.o. female who returns for a follow up for continued management of metastatic colon cancer. She presents today to start Cycle 1, Day 1 of FOLFIRI plus Vectibix . She is unaccompanied for this visit.   Lindsay [name] reports she is doing well without any new or concerning symptoms. She completed her course of antibiotics and feels the port incision site has healed. Her energy and appetite are overall stable. She denies nausea, vomiting or bowel habit changes. She denies easy bruising or signs of active bleeding. She denies fevers, chills, sweats, shortness of breath, chest pain or cough. She has no other complaints.    All other systems were reviewed with the patient and are negative.  MEDICAL HISTORY:  Past Medical History:  Diagnosis Date   Arthritis    Diabetes mellitus without complication (HCC)    Hypertension    met colon ca to liver 01/2020    SURGICAL HISTORY: Past Surgical History:  Procedure Laterality Date   ABDOMINAL HYSTERECTOMY     ANTERIOR AND POSTERIOR REPAIR WITH SACROSPINOUS FIXATION N/A 07/16/2016   Procedure: ANTERIOR AND POSTERIOR REPAIR WITH SACROSPINOUS FIXATION;  Surgeon: Thora Flint, MD;  Location: [facility];  Service: Gynecology;  Laterality: N/A;   CYSTOSCOPY  07/16/2016   Procedure: CYSTOSCOPY;  Surgeon: Thora Flint, MD;  Location: [facility];  Service: Gynecology;;   ESOPHAGOGASTRODUODENOSCOPY (EGD) WITH PROPOFOL  N/A 12/22/2019   Procedure: ESOPHAGOGASTRODUODENOSCOPY (EGD) WITH PROPOFOL ;  Surgeon: Alvis Jourdain, MD;  Location: [facility];  Service: Endoscopy;  Laterality: N/A;   FINE NEEDLE ASPIRATION N/A 12/22/2019   Procedure: FINE NEEDLE ASPIRATION (FNA) LINEAR;  Surgeon: Alvis Jourdain, MD;  Location: [facility];  Service: Endoscopy;  Laterality: N/A;   IR IMAGING GUIDED PORT INSERTION  01/24/2020   IR REMOVAL TUN ACCESS W/ PORT W/O FL MOD SED   07/30/2023   LAPAROSCOPIC VAGINAL HYSTERECTOMY WITH SALPINGECTOMY Bilateral 07/16/2016   Procedure: LAPAROSCOPIC ASSISTED VAGINAL HYSTERECTOMY WITH SALPINGECTOMY;  Surgeon: Thora Flint, MD;  Location: [facility];  Service: Gynecology;  Laterality: Bilateral;   LEFT HEART CATH AND CORONARY ANGIOGRAPHY N/A 05/07/2023   Procedure: LEFT HEART CATH AND CORONARY ANGIOGRAPHY;  Surgeon: Arleen Lacer, MD;  Location: [facility];  Service: Cardiovascular;  Laterality: N/A;   MYOMECTOMY ABDOMINAL APPROACH     THYROID SURGERY     tyroid     UPPER ESOPHAGEAL ENDOSCOPIC ULTRASOUND (EUS) N/A 12/22/2019   Procedure: UPPER ESOPHAGEAL ENDOSCOPIC ULTRASOUND (EUS);  Surgeon: Alvis Jourdain, MD;  Location: [facility];  Service: Endoscopy;  Laterality: N/A;    I have reviewed the social history and family history with the patient and they are unchanged from previous note.  ALLERGIES:  is allergic to oxaliplatin .  MEDICATIONS:  Current Outpatient Medications  Medication Sig Dispense Refill   aspirin  EC 81 MG tablet Take 1 tablet (81 mg total) by mouth daily. Swallow whole. 90 tablet 0   atorvastatin  (LIPITOR) 80 MG tablet Take 1 tablet (80 mg total) by mouth daily. 90 tablet 0   clindamycin  (CLEOCIN -T) 1 % lotion Apply topically as needed. 60 mL 0   clopidogrel  (PLAVIX ) 75 MG tablet Take 1 tablet (75 mg total) by mouth daily. 90 tablet 0   KLOR-CON  M20 20 MEQ tablet TAKE 1 TABLET TWICE A DAY 180 tablet 3   metoprolol  tartrate (LOPRESSOR ) 25 MG tablet Take 0.5 tablets (12.5 mg total) by mouth 2 (two) times daily. 30 tablet 2   pantoprazole  (PROTONIX ) 20 MG tablet Take 1 tablet (20 mg total) by mouth daily. 30 tablet 1   doxycycline  (VIBRA -TABS) 100 MG tablet Take 1 tablet (100 mg total) by mouth 2 (two) times daily. 60 tablet 3   No current facility-administered medications for this visit.   Facility-Administered Medications Ordered in Other Visits  Medication Dose Route Frequency Provider Last Rate  Last Admin   0.9 %  sodium chloride  infusion   Intravenous Continuous Sonja Plumwood, MD   Stopped at 08/11/23 1625   fluorouracil  (ADRUCIL ) 3,500 mg in sodium chloride  0.9 % 80 mL chemo infusion  2,000 mg/m2 (Treatment Plan Recorded) Intravenous 1 day or 1 dose Sonja Intercourse, MD   Infusion Verify at 08/11/23 1801    PHYSICAL EXAMINATION: ECOG PERFORMANCE STATUS: 1 - Symptomatic but completely ambulatory  Vitals:   08/11/23 0955  BP: 135/78  Pulse: (!) 56  Resp: 17  Temp: 97.6 F (36.4 C)  SpO2: 97%   Wt Readings from Last 3 Encounters:  08/11/23 172 lb 11.2 oz (78.3 kg)  08/04/23 174 lb 6.4 oz (79.1 kg)  07/28/23 174 lb 1.6 oz (79 kg)     GENERAL:alert, no distress and comfortable SKIN: skin color, texture, turgor are normal, no rashes or significant lesions, healed port a cath incision without discharge.  No tenderness. EYES: normal, Conjunctiva are pink and non-injected, sclera clear NECK: supple, thyroid normal size, non-tender, without nodularity LUNGS: clear to auscultation and percussion with normal breathing effort HEART: regular rate & rhythm and no murmurs and no lower extremity edema ABDOMEN:abdomen soft, non-tender and normal bowel sounds Musculoskeletal:no cyanosis of digits and no clubbing  NEURO: alert & oriented x 3 with fluent speech, no focal  motor/sensory deficits  LABORATORY DATA:  I have reviewed the data as listed    Latest Ref Rng & Units 08/11/2023    9:06 AM 08/04/2023   11:13 AM 07/28/2023    9:47 AM  CBC  WBC 4.0 - 10.5 K/uL 9.1  10.8  16.3   Hemoglobin 12.0 - 15.0 g/dL 16.1  09.6  04.5   Hematocrit 36.0 - 46.0 % 33.7  34.9  37.0   Platelets 150 - 400 K/uL 284  285  292         Latest Ref Rng & Units 08/11/2023    9:06 AM 08/04/2023   11:13 AM 07/28/2023    9:47 AM  CMP  Glucose 70 - 99 mg/dL 409  811  914   BUN 8 - 23 mg/dL 10  13  10    Creatinine 0.44 - 1.00 mg/dL 7.82  9.56  2.13   Sodium 135 - 145 mmol/L 138  138  140   Potassium 3.5 - 5.1  mmol/L 4.1  3.7  4.0   Chloride 98 - 111 mmol/L 103  102  104   CO2 22 - 32 mmol/L 29  27  28    Calcium  8.9 - 10.3 mg/dL 9.5  9.2  9.7   Total Protein 6.5 - 8.1 g/dL 7.7  7.5  7.6   Total Bilirubin 0.0 - 1.2 mg/dL 0.4  0.2  0.3   Alkaline Phos 38 - 126 U/L 149  157  198   AST 15 - 41 U/L 25  21  20    ALT 0 - 44 U/L 30  23  18        RADIOGRAPHIC STUDIES: I have personally reviewed the radiological images as listed and agreed with the findings in the report. No results found.   ASSESSMENT AND PLAN: Lindsay [name] is a 68 y.o. female who presents to the clinic for evaluation of metastatic colon cancer.   #Metastatic colon cancer: --See oncologic history as above --Recent CT scan from April 2025 showed mild disease progression in the liver, recommendation was to change FOLFIRI plus Bevacizumab  to FOLFIRI plus Vectibix .  --Due to start Cycle 1, Day 1 of FOLFIRI plus Vectibix  today --Labs from today were reviewed and adequate for treatment. WBC 9.1, Hgb 10.8, Plt 284, Creatinine and LFTs normal.  --Proceed with treatment today without any dose modifications --Sent prescription for doxycycline  and cleocin  gel to minimize severity/risk of aceiform rash that can be seen with Vectibix .  --RTC in 2 weeks with labs and follow up prior to Cycle 2, Day 1.   No orders of the defined types were placed in this encounter.  All questions were answered. The patient knows to call the clinic with any problems, questions or concerns. No barriers to learning was detected.   I have spent a total of 30 minutes minutes of face-to-face and non-face-to-face time, preparing to see the patient, performing a medically appropriate examination, counseling and educating the patient, ordering medications/tests/procedures, documenting clinical information in the electronic health record, independently interpreting results and communicating results to the patient, and care coordination.   Wyline Hearing PA-C Dept of  Hematology and Oncology [facility] Phone: [phone]

## 2023-08-11 NOTE — Patient Instructions (Signed)
 [facility] - A DEPT OF [facility]  Discharge Instructions: Thank you for choosing [facility] to provide your oncology and hematology care.   If you have a lab appointment with the Cancer Center, please go directly to the Cancer Center and check in at the registration area.   Wear comfortable clothing and clothing appropriate for easy access to any Portacath or PICC line.   We strive to give you quality time with your provider. You may need to reschedule your appointment if you arrive late (15 or more minutes).  Arriving late affects you and other patients whose appointments are after yours.  Also, if you miss three or more appointments without notifying the office, you may be dismissed from the clinic at the provider's discretion.      For prescription refill requests, have your pharmacy contact our office and allow 72 hours for refills to be completed.    Today you received the following chemotherapy and/or immunotherapy agents: Panitumumab  (Vectibix ), Irinotecan /Leucovorin /5FU pump      To help prevent nausea and vomiting after your treatment, we encourage you to take your nausea medication as directed.  BELOW ARE SYMPTOMS THAT SHOULD BE REPORTED IMMEDIATELY: *FEVER GREATER THAN 100.4 F (38 C) OR HIGHER *CHILLS OR SWEATING *NAUSEA AND VOMITING THAT IS NOT CONTROLLED WITH YOUR NAUSEA MEDICATION *UNUSUAL SHORTNESS OF BREATH *UNUSUAL BRUISING OR BLEEDING *URINARY PROBLEMS (pain or burning when urinating, or frequent urination) *BOWEL PROBLEMS (unusual diarrhea, constipation, pain near the anus) TENDERNESS IN MOUTH AND THROAT WITH OR WITHOUT PRESENCE OF ULCERS (sore throat, sores in mouth, or a toothache) UNUSUAL RASH, SWELLING OR PAIN  UNUSUAL VAGINAL DISCHARGE OR ITCHING   Items with * indicate a potential emergency and should be followed up as soon as possible or go to the Emergency Department if any problems should occur.  Please show  the CHEMOTHERAPY ALERT CARD or IMMUNOTHERAPY ALERT CARD at check-in to the Emergency Department and triage nurse.  Should you have questions after your visit or need to cancel or reschedule your appointment, please contact [facility] - A DEPT OF [facility]  Dept: [phone]  and follow the prompts.  Office hours are 8:00 a.m. to 4:30 p.m. Monday - Friday. Please note that voicemails left after 4:00 p.m. may not be returned until the following business day.  We are closed weekends and major holidays. You have access to a nurse at all times for urgent questions. Please call the main number to the clinic Dept: [phone] and follow the prompts.   For any non-urgent questions, you may also contact your provider using MyChart. We now offer e-Visits for anyone 62 and older to request care online for non-urgent symptoms. For details visit [email or web address].   Also download the MyChart app! Go to the app store, search "MyChart", open the app, select [place], and log in with your MyChart username and password.  Panitumumab  Injection What is this medication? PANITUMUMAB  (pan i TOOM ue mab) treats colorectal cancer. It works by blocking a protein that causes cancer cells to grow and multiply. This helps to slow or stop the spread of cancer cells. It is a monoclonal antibody. This medicine may be used for other purposes; ask your health care provider or pharmacist if you have questions. COMMON BRAND NAME(S): Vectibix  What should I tell my care team before I take this medication? They need to know if you have any of  these conditions: Eye disease Low levels of magnesium  in the blood Lung disease An unusual or allergic reaction to panitumumab , other medications, foods, dyes, or preservatives Pregnant or trying to get pregnant Breast-feeding How should I use this medication? This medication is injected into a vein. It is given by your care team in a hospital or  clinic setting. Talk to your care team about the use of this medication in children. Special care may be needed. Overdosage: If you think you have taken too much of this medicine contact a poison control center or emergency room at once. NOTE: This medicine is only for you. Do not share this medicine with others. What if I miss a dose? Keep appointments for follow-up doses. It is important not to miss your dose. Call your care team if you are unable to keep an appointment. What may interact with this medication? Bevacizumab  This list may not describe all possible interactions. Give your health care provider a list of all the medicines, herbs, non-prescription drugs, or dietary supplements you use. Also tell them if you smoke, drink alcohol, or use illegal drugs. Some items may interact with your medicine. What should I watch for while using this medication? Your condition will be monitored carefully while you are receiving this medication. This medication may make you feel generally unwell. This is not uncommon as chemotherapy can affect healthy cells as well as cancer cells. Report any side effects. Continue your course of treatment even though you feel ill unless your care team tells you to stop. This medication can make you more sensitive to the sun. Keep out of the sun while receiving this medication and for 2 months after stopping therapy. If you cannot avoid being in the sun, wear protective clothing and sunscreen. Do not use sun lamps, tanning beds, or tanning booths. Check with your care team if you have severe diarrhea, nausea, and vomiting or if you sweat a lot. The loss of too much body fluid may make it dangerous for you to take this medication. This medication may cause serious skin reactions. They can happen weeks to months after starting the medication. Contact your care team right away if you notice fevers or flu-like symptoms with a rash. The rash may be red or purple and then turn  into blisters or peeling of the skin. You may also notice a red rash with swelling of the face, lips, or lymph nodes in your neck or under your arms. Talk to your care team if you may be pregnant. Serious birth defects can occur if you take this medication during pregnancy and for 2 months after the last dose. Contraception is recommended while taking this medication and for 2 months after the last dose. Your care team can help you find the option that works for you. Do not breastfeed while taking this medication and for 2 months after the last dose. This medication may cause infertility. Talk to your care team if you are concerned about your fertility. What side effects may I notice from receiving this medication? Side effects that you should report to your care team as soon as possible: Allergic reactions--skin rash, itching, hives, swelling of the face, lips, tongue, or throat Dry cough, shortness of breath or trouble breathing Eye pain, redness, irritation, or discharge with blurry or decreased vision Infusion reactions--chest pain, shortness of breath or trouble breathing, feeling faint or lightheaded Low magnesium  level--muscle pain or cramps, unusual weakness or fatigue, fast or irregular heartbeat, tremors Low potassium level--muscle  pain or cramps, unusual weakness or fatigue, fast or irregular heartbeat, constipation Redness, blistering, peeling, or loosening of the skin, including inside the mouth Skin reactions on sun-exposed areas Side effects that usually do not require medical attention (report to your care team if they continue or are bothersome): Change in nail shape, thickness, or color Diarrhea Dry skin Fatigue Nausea Vomiting This list may not describe all possible side effects. Call your doctor for medical advice about side effects. You may report side effects to FDA at 1-800-FDA-1088. Where should I keep my medication? This medication is given in a hospital or clinic. It  will not be stored at home. NOTE: This sheet is a summary. It may not cover all possible information. If you have questions about this medicine, talk to your doctor, pharmacist, or health care provider.  2024 Elsevier/Gold Standard (2021-07-09 00:00:00)

## 2023-08-12 ENCOUNTER — Encounter: Payer: Self-pay | Admitting: Hematology

## 2023-08-12 NOTE — Telephone Encounter (Signed)
 Patient scheduled to see Dr. Albert Huff tomorrow at 9:20am

## 2023-08-13 ENCOUNTER — Ambulatory Visit: Payer: Medicare (Managed Care) | Attending: Cardiology | Admitting: Cardiology

## 2023-08-13 ENCOUNTER — Encounter: Payer: Self-pay | Admitting: Cardiology

## 2023-08-13 ENCOUNTER — Inpatient Hospital Stay: Payer: Medicare (Managed Care)

## 2023-08-13 ENCOUNTER — Other Ambulatory Visit (HOSPITAL_COMMUNITY): Payer: Self-pay

## 2023-08-13 ENCOUNTER — Other Ambulatory Visit: Payer: Self-pay

## 2023-08-13 VITALS — BP 113/65 | HR 68 | Resp 16 | Ht 66.0 in | Wt 170.8 lb

## 2023-08-13 VITALS — BP 122/71 | HR 75 | Resp 16

## 2023-08-13 DIAGNOSIS — C221 Intrahepatic bile duct carcinoma: Secondary | ICD-10-CM

## 2023-08-13 DIAGNOSIS — R072 Precordial pain: Secondary | ICD-10-CM

## 2023-08-13 DIAGNOSIS — Z95828 Presence of other vascular implants and grafts: Secondary | ICD-10-CM

## 2023-08-13 DIAGNOSIS — E782 Mixed hyperlipidemia: Secondary | ICD-10-CM

## 2023-08-13 DIAGNOSIS — I25118 Atherosclerotic heart disease of native coronary artery with other forms of angina pectoris: Secondary | ICD-10-CM | POA: Diagnosis not present

## 2023-08-13 DIAGNOSIS — Z5112 Encounter for antineoplastic immunotherapy: Secondary | ICD-10-CM | POA: Diagnosis not present

## 2023-08-13 DIAGNOSIS — E119 Type 2 diabetes mellitus without complications: Secondary | ICD-10-CM

## 2023-08-13 DIAGNOSIS — I10 Essential (primary) hypertension: Secondary | ICD-10-CM | POA: Diagnosis not present

## 2023-08-13 DIAGNOSIS — C19 Malignant neoplasm of rectosigmoid junction: Secondary | ICD-10-CM | POA: Diagnosis not present

## 2023-08-13 DIAGNOSIS — I350 Nonrheumatic aortic (valve) stenosis: Secondary | ICD-10-CM | POA: Diagnosis not present

## 2023-08-13 DIAGNOSIS — R0989 Other specified symptoms and signs involving the circulatory and respiratory systems: Secondary | ICD-10-CM

## 2023-08-13 MED ORDER — NITROGLYCERIN 0.4 MG SL SUBL
0.4000 mg | SUBLINGUAL_TABLET | SUBLINGUAL | 0 refills | Status: DC | PRN
  Filled 2023-08-13: qty 25, 15d supply, fill #0

## 2023-08-13 MED ORDER — RANOLAZINE ER 500 MG PO TB12
500.0000 mg | ORAL_TABLET | Freq: Two times a day (BID) | ORAL | 3 refills | Status: DC
  Filled 2023-08-13: qty 60, 30d supply, fill #0

## 2023-08-13 MED ORDER — HEPARIN SOD (PORK) LOCK FLUSH 100 UNIT/ML IV SOLN
500.0000 [IU] | Freq: Once | INTRAVENOUS | Status: AC
  Administered 2023-08-13: 500 [IU]

## 2023-08-13 MED ORDER — SODIUM CHLORIDE 0.9% FLUSH
10.0000 mL | Freq: Once | INTRAVENOUS | Status: AC
  Administered 2023-08-13: 10 mL

## 2023-08-13 MED ORDER — METOPROLOL SUCCINATE ER 25 MG PO TB24
25.0000 mg | ORAL_TABLET | Freq: Every day | ORAL | 3 refills | Status: AC
Start: 2023-08-13 — End: ?
  Filled 2023-08-13: qty 90, 90d supply, fill #0

## 2023-08-13 NOTE — Progress Notes (Signed)
 Cardiology Office Note:  .   Date:  08/13/2023  ID:  MEKLIT COTTA, DOB Mar 28, 1955, MRN [id number] PCP:  [name], MD  Former Cardiology Providers: Dr. Venetta [facility] Providers Cardiologist:  Olinda Bertrand, DO , [facility] (established care 08/13/23) Electrophysiologist:  None  Click to update primary MD,subspecialty MD or APP then REFRESH:1}    Chief Complaint  Patient presents with   Chest Pain   Follow-up    History of Present Illness: .   Lindsay [name] is a 68 y.o. African-American female whose past medical history and cardiovascular risk factors includes: Metastatic colon cancer, hypertension, diabetes, former smoker (17-pack-year history, quit in 2020), coronary calcification, coronary artery disease, questionable bicuspid aortic valve, aortic stenosis, advanced age  Formally under the care of Dr. Knox Perl who last saw Lindsay [name] back in 10/2019 and was scheduled to see Dr. Alois Arnt after recent hospitalization in February 2025.  However, due to Dr. Junita Oliva departure she is referred to me for evaluation.  I am seeing her for the first time to re-establishing care.   Saw Dr. Berry Bristol in 2021 at that time she did undergo ischemic workup.  She was hospitalized in February 2025 and ruled in for NSTEMI.  Echocardiogram noted preserved LVEF, grade 2 diastolic dysfunction, moderate to severe aortic stenosis.  She underwent left heart catheterization results reviewed and noted below but essentially recommended medical therapy.  She is currently undergoing chemotherapy for her colon cancer with metastasis to liver.  Patient was referred to the practice on an urgent basis after experiencing chest pain during her chemotherapy.  At that time she was noted to be hypertensive & EKG and noted sinus rhythm with LVH/repolarization abnormality.  Chest pain: Substernal. Duration: Chronic But on Wednesday, August 11, 2023 while she was at chemo and the symptoms were more noticeable and  intense. Took longer time for resolution. Not brought on by effort related activities.  Does not resolve with rest.   At times feels like it is indigestion She was noted to be hypertensive at that time and EKG showed sinus rhythm with ST-T changes likely secondary to repolarization abnormality. Her antiischemic agents are limited to Lopressor  12.5 mg p.o. twice daily  Overall physical endurance is limited due to ongoing chemotherapy and feeling tired.     Review of Systems: .   Review of Systems  Cardiovascular:  Positive for chest pain (see HPI). Negative for claudication, irregular heartbeat, leg swelling, near-syncope, orthopnea, palpitations, paroxysmal nocturnal dyspnea and syncope.  Respiratory:  Negative for shortness of breath.   Hematologic/Lymphatic: Negative for bleeding problem.    Studies Reviewed:   EKG: EKG Interpretation Date/Time:  Friday August 13 2023 09:19:42 EDT Ventricular Rate:  67 PR Interval:  174 QRS Duration:  70 QT Interval:  410 QTC Calculation: 433 R Axis:   19  Text Interpretation: Sinus rhythm with marked sinus arrhythmia  Left ventricular hypertrophy with strain pattern When compared with ECG of 11-Aug-2023 14:39, ST no longer depressed in Lateral leads  Confirmed by Olinda Bertrand [phone]) on 08/13/2023 9:40:20 AM  Echocardiogram: 04/2023 1. Left ventricular ejection fraction, by estimation, is 55 to 60%. The left ventricle has normal function. The left ventricle has no regional wall motion abnormalities. Left ventricular diastolic parameters are consistent with Grade II diastolic dysfunction (pseudonormalization). 2. Right ventricular systolic function is normal. The right ventricular size is normal. 3. Left atrial size was mildly dilated. 4. The mitral valve is normal in structure. Trivial mitral  valve regurgitation. No evidence of mitral stenosis. 5. Visually calcified with decreased stroke volume index and DVI of 0.30 and acceleration time  greater thant 100 ms. The aortic valve is calcified. Aortic valve regurgitation is not visualized. Moderate to severe aortic valve stenosis. Aortic valve area, by VTI measures 0.85 cm. Aortic valve mean gradient measures 20.0 mmHg. Aortic valve Vmax measures 3.07 m/s. Aortic valve acceleration time measures 105 msec.  Heart catheterization: February 2025   Ramus-1 lesion is 90% stenosed.   Ramus-2 lesion is 80% stenosed.   Prox RCA lesion is 75% stenosed.   Mid RCA lesion is 50% stenosed.   Prox LAD to Mid LAD lesion is 40% stenosed.   LV end diastolic pressure is mildly elevated.   There is (mild-) moderate aortic valve stenosis.   Dominance: Right  Severe two-vessel disease with diffuse disease small vessels throughout colon likely culprit is 90% followed by 80% lesions and a 1.5 mm high Diag/Ramus, along with a focal 75% stenosis and a very small caliber RCA followed by segmental 50% stenosis. Mild diffuse disease in the proximal LAD -Lesions are not amenable to PCI based on the small caliber vessels. Best suited for medical management.  Relative normal LVEDP.      RECOMMENDATIONS   Anticipated discharge date to be determined.  She will be returned to [facility] for ongoing care   Recommend uninterrupted dual antiplatelet therapy with Aspirin  81mg  daily and Clopidogrel  75mg  daily for a minimum of 12 months (ACS-Class I recommendation). Would be okay to simply do Plavix  monotherapy after first 3 months given the ACS presentation.   Continue to titrate GDMT for CAD.       Randene Bustard, MD   RADIOLOGY: NA  Risk Assessment/Calculations:   NA   Labs:       Latest Ref Rng & Units 08/11/2023    9:06 AM 08/04/2023   11:13 AM 07/28/2023    9:47 AM  CBC  WBC 4.0 - 10.5 K/uL 9.1  10.8  16.3   Hemoglobin 12.0 - 15.0 g/dL 40.9  81.1  91.4   Hematocrit 36.0 - 46.0 % 33.7  34.9  37.0   Platelets 150 - 400 K/uL 284  285  292        Latest Ref Rng & Units 08/11/2023    9:06 AM  08/04/2023   11:13 AM 07/28/2023    9:47 AM  BMP  Glucose 70 - 99 mg/dL 782  956  213   BUN 8 - 23 mg/dL 10  13  10    Creatinine 0.44 - 1.00 mg/dL 0.86  5.78  4.69   Sodium 135 - 145 mmol/L 138  138  140   Potassium 3.5 - 5.1 mmol/L 4.1  3.7  4.0   Chloride 98 - 111 mmol/L 103  102  104   CO2 22 - 32 mmol/L 29  27  28    Calcium  8.9 - 10.3 mg/dL 9.5  9.2  9.7       Latest Ref Rng & Units 08/11/2023    9:06 AM 08/04/2023   11:13 AM 07/28/2023    9:47 AM  CMP  Glucose 70 - 99 mg/dL 629  528  413   BUN 8 - 23 mg/dL 10  13  10    Creatinine 0.44 - 1.00 mg/dL 2.44  0.10  2.72   Sodium 135 - 145 mmol/L 138  138  140   Potassium 3.5 - 5.1 mmol/L 4.1  3.7  4.0  Chloride 98 - 111 mmol/L 103  102  104   CO2 22 - 32 mmol/L 29  27  28    Calcium  8.9 - 10.3 mg/dL 9.5  9.2  9.7   Total Protein 6.5 - 8.1 g/dL 7.7  7.5  7.6   Total Bilirubin 0.0 - 1.2 mg/dL 0.4  0.2  0.3   Alkaline Phos 38 - 126 U/L 149  157  198   AST 15 - 41 U/L 25  21  20    ALT 0 - 44 U/L 30  23  18       Lab Results  Component Value Date   CHOL 130 07/09/2023   HDL 46 07/09/2023   LDLCALC 68 07/09/2023   TRIG 84 07/09/2023   CHOLHDL 2.8 07/09/2023   Recent Labs    05/08/23 0640  LIPOA 210.7*   No components found for: "NTPROBNP" No results for input(s): "PROBNP" in the last 8760 hours. No results for input(s): "TSH" in the last 8760 hours.   Physical Exam:  Today's Vitals   08/13/23 0921  BP: 113/65  Pulse: 68  Resp: 16  SpO2: 95%  Weight: 170 lb 12.8 oz (77.5 kg)  Height: 5\' 6"  (1.676 m)   Body mass index is 27.57 kg/m. Wt Readings from Last 3 Encounters:  08/13/23 170 lb 12.8 oz (77.5 kg)  08/11/23 172 lb 11.2 oz (78.3 kg)  08/04/23 174 lb 6.4 oz (79.1 kg)    Physical Exam  Constitutional: No distress.  hemodynamically stable  Neck: No JVD present.  Cardiovascular: Normal rate, regular rhythm, S1 normal and S2 normal. Exam reveals no gallop, no S3 and no S4.  Murmur heard. Midsystolic murmur is  present with a grade of 3/6 at the upper right sternal border radiating to the neck. Pulses:      Carotid pulses are  on the right side with bruit and  on the left side with bruit. Pulmonary/Chest: Effort normal and breath sounds normal. No stridor. She has no wheezes. She has no rales.  Recent port removal  Musculoskeletal:        General: No edema.     Cervical back: Neck supple.  Skin: Skin is warm.     Impression:   ICD-10-CM   1. Precordial pain  R07.2 EKG 12-Lead    metoprolol  succinate (TOPROL  XL) 25 MG 24 hr tablet    ranolazine (RANEXA) 500 MG 12 hr tablet    nitroGLYCERIN  (NITROSTAT ) 0.4 MG SL tablet    2. Coronary artery disease involving native coronary artery of native heart with other form of angina pectoris (HCC)  I25.118 metoprolol  succinate (TOPROL  XL) 25 MG 24 hr tablet    ranolazine (RANEXA) 500 MG 12 hr tablet    nitroGLYCERIN  (NITROSTAT ) 0.4 MG SL tablet    3. Nonrheumatic aortic valve stenosis  I35.0 ECHOCARDIOGRAM COMPLETE    4. Essential hypertension  I10     5. Type 2 diabetes mellitus without complication, without long-term current use of insulin (HCC)  E11.9     6. Mixed hyperlipidemia  E78.2     7. Bilateral carotid bruits  R09.89 VAS US  CAROTID       Recommendation(s):  Precordial pain Coronary artery disease involving native coronary artery of native heart with other form of angina pectoris (HCC) Precordial pain not predominantly cardiac. Likely precipitated by uncontrolled hypertension and underlying LVH as well as known CAD recommended to be treated medically as well as aortic stenosis. Current antianginal therapy includes Lopressor  12.5 mg  p.o. twice daily Discontinue Lopressor  12.5 mg p.o. twice daily.  Transition to Toprol -XL 25 mg p.o. daily. Start Ranexa 500 mg p.o. twice daily. Sublingual nitroglycerin  tablets to use on appearing basis. Since I am seeing her for the first time to reestablish care I reviewed the echocardiogram and heart  catheterization from February 2025.  The results were reviewed with the patient at today's office visit. If her symptoms continue after taking 2 sublingual nitroglycerin  tablets patient is advised to go to the ER for further evaluation and management. Continue dual antiplatelet therapy for total of 1 year given her recent NSTEMI, as long as she remains low risk for bleeding  Nonrheumatic aortic valve stenosis Echo February 2025: LVEF 55 to 60%, findings to suggest paradoxical low-flow low gradient aortic stenosis, moderate to severe aortic stenosis (mean gradient 20, peak velocity 3.07 m/s, aortic valve area per VTI 0.85 cm). Would like to repeat an echocardiogram as a 44-month follow-up study given the moderate to severe aortic stenosis.  Will repeat an echocardiogram prior to the next office visit. Patient is advised to seek medical attention if she has ongoing chest pain, heart failure symptoms, or syncope. The valve was not crossed as part of her most recent heart catheterization in February 2025, per report  Essential hypertension Office blood pressures at today are very well-controlled. Medications reconciled and changes mentioned above  Type 2 diabetes mellitus without complication, without long-term current use of insulin (HCC) Reemphasized importance of glycemic control.  Mixed hyperlipidemia Currently on Lipitor 80 mg p.o. nightly Cardiology following peripherally, managed by primary care provider.  Bilateral carotid bruits Likely secondary to underlying aortic stenosis, would like to rule out organic carotid disease.   Orders Placed:  Orders Placed This Encounter  Procedures   EKG 12-Lead   ECHOCARDIOGRAM COMPLETE    Standing Status:   Future    Expected Date:   10/11/2023    Expiration Date:   08/12/2024    Where should this test be performed[facility]    Does the patient weigh less than or greater than 250 lbs?:   Patient weighs less than 250 lbs     Perflutren DEFINITY (image enhancing agent) should be administered unless hypersensitivity or allergy exist:   Administer Perflutren    Reason for exam-Echo:   Other-Full Diagnosis List    Full ICD-10/Reason for Exam:   Aortic stenosis [960454]     Final Medication List:    Meds ordered this encounter  Medications   metoprolol  succinate (TOPROL  XL) 25 MG 24 hr tablet    Sig: Take 1 tablet (25 mg total) by mouth daily. Hold if systolic BP (top number) is less than 100 and/or heart rate is less than 55    Dispense:  90 tablet    Refill:  3   ranolazine (RANEXA) 500 MG 12 hr tablet    Sig: Take 1 tablet (500 mg total) by mouth 2 (two) times daily.    Dispense:  60 tablet    Refill:  3   nitroGLYCERIN  (NITROSTAT ) 0.4 MG SL tablet    Sig: Place 1 tablet (0.4 mg total) under the tongue every 5 (five) minutes as needed for chest pain.    Dispense:  25 tablet    Refill:  0    Medications Discontinued During This Encounter  Medication Reason   KLOR-CON  M20 20 MEQ tablet Patient Preference   pantoprazole  (PROTONIX ) 20 MG tablet Patient Preference   metoprolol  tartrate (LOPRESSOR ) 25  MG tablet Change in therapy     Current Outpatient Medications:    aspirin  EC 81 MG tablet, Take 1 tablet (81 mg total) by mouth daily. Swallow whole., Disp: 90 tablet, Rfl: 0   atorvastatin  (LIPITOR) 80 MG tablet, Take 1 tablet (80 mg total) by mouth daily., Disp: 90 tablet, Rfl: 0   clindamycin  (CLEOCIN -T) 1 % lotion, Apply topically as needed., Disp: 60 mL, Rfl: 0   clopidogrel  (PLAVIX ) 75 MG tablet, Take 1 tablet (75 mg total) by mouth daily., Disp: 90 tablet, Rfl: 0   doxycycline  (VIBRA -TABS) 100 MG tablet, Take 1 tablet (100 mg total) by mouth 2 (two) times daily., Disp: 60 tablet, Rfl: 3   metoprolol  succinate (TOPROL  XL) 25 MG 24 hr tablet, Take 1 tablet (25 mg total) by mouth daily. Hold if systolic BP (top number) is less than 100 and/or heart rate is less than 55, Disp: 90 tablet, Rfl: 3    nitroGLYCERIN  (NITROSTAT ) 0.4 MG SL tablet, Place 1 tablet (0.4 mg total) under the tongue every 5 (five) minutes as needed for chest pain., Disp: 25 tablet, Rfl: 0   ranolazine (RANEXA) 500 MG 12 hr tablet, Take 1 tablet (500 mg total) by mouth 2 (two) times daily., Disp: 60 tablet, Rfl: 3  Consent:   NA  Disposition:   Will have diagnostic workup in August 2025 and I will see her in September 2025.  Her questions and concerns were addressed to her satisfaction. She voices understanding of the recommendations provided during this encounter.    Signed, Awilda Bogus, [facility]  A Division of [facility] [address] [place], [place] [address]  [place], [place] [address] 08/13/2023 10:57 AM

## 2023-08-13 NOTE — Progress Notes (Signed)
 Pt here for pump d/c but observed to have about 15cc of medication left in bag. Wing Hauser, RN notified and came to chairside. Pattie Borders, NP also notified and at chairside. She spoke to pharmacy who advised to waste the remaining medication. Pump d/c. PICC line flushed, saline locked.

## 2023-08-13 NOTE — Patient Instructions (Addendum)
 Medication Instructions:  Discontinue Metoprolol  Tartrate (Lopressor )  Start Metoprolol  Succinate (Toprol -XL) 25 mg once a day in the morning Hold if systolic BP (top number) is less than 100 and/or heart rate is less than 55  As needed Nitroglycerin  for chest pain, tightness, and discomfort The proper use and anticipated side effects of nitroglycerine has been carefully explained.  If a single episode of chest pain is not relieved by one tablet, the patient will try another within 5 minutes; and if this doesn't relieve the pain, the patient is instructed to call 911 for transportation to an emergency department.  *If you need a refill on your cardiac medications before your next appointment, please call your pharmacy*  Lab Work: None If you have labs (blood work) drawn today and your tests are completely normal, you will receive your results only by: MyChart Message (if you have MyChart) OR A paper copy in the mail If you have any lab test that is abnormal or we need to change your treatment, we will call you to review the results.  Testing/Procedures: Your physician has requested that you have an echocardiogram. Echocardiography is a painless test that uses sound waves to create images of your heart. It provides your doctor with information about the size and shape of your heart and how well your heart's chambers and valves are working. This procedure takes approximately one hour. There are no restrictions for this procedure. Please do NOT wear cologne, perfume, aftershave, or lotions (deodorant is allowed). Please arrive 15 minutes prior to your appointment time.  Please note: We ask at that you not bring children with you during ultrasound (echo/ vascular) testing. Due to room size and safety concerns, children are not allowed in the ultrasound rooms during exams. Our front office staff cannot provide observation of children in our lobby area while testing is being conducted. An adult  accompanying a patient to their appointment will only be allowed in the ultrasound room at the discretion of the ultrasound technician under special circumstances. We apologize for any inconvenience.   Your physician has requested that you have a carotid duplex. This test is an ultrasound of the carotid arteries in your neck. It looks at blood flow through these arteries that supply the brain with blood. Allow one hour for this exam. There are no restrictions or special instructions.   Follow-Up: At [facility], you and your health needs are our priority.  As part of our continuing mission to provide you with exceptional heart care, our providers are all part of one team.  This team includes your primary Cardiologist (physician) and Advanced Practice Providers or APPs (Physician Assistants and Nurse Practitioners) who all work together to provide you with the care you need, when you need it.  Your next appointment:   3 month(s)  Provider:   Dr. Olinda Bertrand  We recommend signing up for the patient portal called "MyChart".  Sign up information is provided on this After Visit Summary.  MyChart is used to connect with patients for Virtual Visits (Telemedicine).  Patients are able to view lab/test results, encounter notes, upcoming appointments, etc.  Non-urgent messages can be sent to your provider as well.   To learn more about what you can do with MyChart, go to [email or web address].   Other Instructions

## 2023-08-15 ENCOUNTER — Other Ambulatory Visit: Payer: Self-pay

## 2023-08-23 ENCOUNTER — Ambulatory Visit: Payer: Medicare (Managed Care) | Admitting: Nurse Practitioner

## 2023-08-24 NOTE — Progress Notes (Signed)
 [facility]   [phone] Fax:([phone]    Patient Care Team: [name], MD as PCP - General (Internal Medicine) Olinda Bertrand, DO as PCP - Cardiology (Cardiology) Sonja [place], MD as Consulting Physician (Oncology) Alvis Jourdain, MD as Consulting Physician (Gastroenterology)   CHIEF COMPLAINT: Follow up metastatic rectal cancer   Oncology History  metastatic colon cancer to liver  06/20/2019 Procedure   Colonoscopy by Dr Brice Campi 06/20/19  IMPRESSION -Seven 3 to 10 mm polyps in the sigmoid colon, in the transverse colon and in the escending colon, removed with a cold snare. Resected and retrireved.  -One 20mm polyp in the descending colon. Biopsies. Tattoes.  -Mediaum sized lipoma in the ascending colon.   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 See Comment B. Colon, Ascending, polyp, Polypectomy:  -TUBULAR ADENOMA -No high grade dysplasia or malignancy.  C. Colon, TRansverse, Polyo, polectomy:  -TUBULAR ADENOMA -No high grade dysplasia or malignancy.  D. Colon, Sigmoid, Polyp, Polypectomy:  -HYPERPLASTIC POLYP   11/07/2019 Imaging   US  Abdomen 11/07/19  IMPRESSION: 1. Two solid masses in the liver are nonspecific. Recommend MRI abdomen with and without contrast for further evaluation.   12/15/2019 Imaging   MRI Abdomen 12/15/19  IMPRESSION: 1. There are two large masses in the liver with appearance favoring metastatic disease or hepatocellular carcinoma or cholangiocarcinoma. A benign etiology is highly unlikely given the enhancement pattern and associated adenopathy. 2. Considerable porta hepatis and retroperitoneal adenopathy. Some of the confluent porta hepatis tumor is potentially infiltrative and abuts the pancreatic body along its right upper margin, making it difficult to completely exclude the possibility of pancreatic adenocarcinoma primary.  Possibilities helpful in further workup might include tissue diagnosis, endoscopic ultrasound, or nuclear medicine PET-CT. 3. Pancreas divisum. 4. Lumbar spondylosis and degenerative disc disease. 5. Despite efforts by the technologist and patient, motion artifact is present on today's exam and could not be eliminated. This reduces exam sensitivity and specificity.   12/22/2019 Procedure   Upper Endoscopy by Dr Nickey Barn 12/22/19  IMPRESSION - One lymph node was visualized and measured in the porta hepatis region. Fine needle aspiration performed    12/22/2019 Initial Biopsy   A. LIVER, PORTA HEPATIS MASS, FINE NEEDLE 12/22/19 ASPIRATION:  FINAL MICROSCOPIC DIAGNOSIS:  - Malignant cells consistent with metastatic adenocarcinoma   01/01/2020 Initial Diagnosis   Intrahepatic cholangiocarcinoma (HCC)   01/08/2020 Initial Biopsy   FINAL MICROSCOPIC DIAGNOSIS:   A. LIVER, LEFT LOBE, BIOPSY:  - Metastatic adenocarcinoma, consistent with a colorectal primary.  See  comment      COMMENT:   Immunohistochemical stains show the tumor cells are positive for CK20  and CDX2 but negative for CK7, consistent with above interpretation.  Dr. Maryalice Smaller was notified on 01/10/2020   01/08/2020 Genetic Testing   [name]  [name] wildtype and [name]/[name] mutations which make her eligible for target biological agent Vectibix .   01/24/2020 Procedure   PAC placed 01/24/20   01/25/2020 -  Chemotherapy   first line FOLFOX starting 01/25/2020, Vextibix  added with C2 (02/06/20)   02/06/2020 - 02/06/2022 Chemotherapy   Patient is on Treatment Plan : COLORECTAL Panitumumab  q14d ([name] Wild - Type Gene Only)     06/20/2020 Imaging   CT CAP  IMPRESSION: 1. Continued interval reduction in size and conspicuity of a subsolid nodule of the peripheral left upper lobe. 2. Unchanged prominent pretracheal and subcarinal lymph nodes. 3.  Redemonstrated partially calcified low-attenuation liver masses, slightly  decreased in size compared to prior examination. 4. Slight interval decrease in size of a portacaval lymph node or conglomerate and retroperitoneal lymph nodes. 5. Findings are consistent with continued treatment response of nodal, pulmonary, and hepatic metastatic disease. 6. Coronary artery disease.   Aortic Atherosclerosis (ICD10-I70.0).     10/09/2020 Imaging   IMPRESSION: 1. Slight decrease in size of dominant liver mass with smaller liver mass within 1-2 mm of prior measurement. 2. Stable appearance of celiac lymph and retroperitoneal lymph nodes. Dominant node with calcification in the gastrohepatic ligament as described. 3. Continued decrease in size of LEFT upper lobe nodule. 4. Three-vessel coronary artery calcification. 5. Aortic atherosclerosis.   03/25/2022 - 07/16/2023 Chemotherapy   Patient is on Treatment Plan : COLORECTAL FOLFIRI + Bevacizumab  q14d     08/31/2022 Imaging    IMPRESSION: 1. Unchanged, densely calcified liver lesions, consistent with treated metastatic disease. 2. Unchanged, irregular subpleural opacity of the peripheral left upper lobe. 3. Unchanged subcentimeter gastrohepatic ligament lymph node. 4. No evidence of new metastatic disease in the chest, abdomen, or pelvis. 5. Status post hysterectomy. 6. Coronary artery disease. 7. Aortic valve calcifications. Correlate for echocardiographic evidence of aortic valve dysfunction.     06/22/2023 Imaging   CT Chest, Abdomen, and pelvis with contrast  IMPRESSION: Centrally calcified liver lesions are again seen and slightly increased today. Prominent nodes in the upper retroperitoneum are overall similar. Attention on follow-up.   Stable branching left upper lobe lung nodule.   08/11/2023 -  Chemotherapy   Patient is on Treatment Plan : COLORECTAL FOLFIRI + Panitumumab  q14d        CURRENT THERAPY: FOLFIRI/panitumumab  q14 days   INTERVAL HISTORY [name] returns for follow up and treatment. She  continues FOLFIRI/vectibix .  She feels well in general, tolerating treatment except constipation managed with milk of magnesia every other day which helps.  Skin rash is mild.  She is losing weight intentionally by eating less red meat and avoids late night snacking.  Saw cardiologist recently who adjusted some meds.  She is only used nitroglycerin  once lately, denies chest pain today.  Denies abdominal pain, nausea/vomiting, fever, chills.  ROS  All other systems reviewed and negative  Past Medical History:  Diagnosis Date   Arthritis    Diabetes mellitus without complication (HCC)    Hypertension    met colon ca to liver 01/2020     Past Surgical History:  Procedure Laterality Date   ABDOMINAL HYSTERECTOMY     ANTERIOR AND POSTERIOR REPAIR WITH SACROSPINOUS FIXATION N/A 07/16/2016   Procedure: ANTERIOR AND POSTERIOR REPAIR WITH SACROSPINOUS FIXATION;  Surgeon: Thora Flint, MD;  Location: [facility];  Service: Gynecology;  Laterality: N/A;   CYSTOSCOPY  07/16/2016   Procedure: CYSTOSCOPY;  Surgeon: Thora Flint, MD;  Location: [facility];  Service: Gynecology;;   ESOPHAGOGASTRODUODENOSCOPY (EGD) WITH PROPOFOL  N/A 12/22/2019   Procedure: ESOPHAGOGASTRODUODENOSCOPY (EGD) WITH PROPOFOL ;  Surgeon: Alvis Jourdain, MD;  Location: [facility];  Service: Endoscopy;  Laterality: N/A;   FINE NEEDLE ASPIRATION N/A 12/22/2019   Procedure: FINE NEEDLE ASPIRATION (FNA) LINEAR;  Surgeon: Alvis Jourdain, MD;  Location: [facility];  Service: Endoscopy;  Laterality: N/A;   IR IMAGING GUIDED PORT INSERTION  01/24/2020   IR REMOVAL TUN ACCESS W/ PORT W/O FL MOD SED  07/30/2023   LAPAROSCOPIC VAGINAL HYSTERECTOMY WITH SALPINGECTOMY Bilateral 07/16/2016   Procedure: LAPAROSCOPIC ASSISTED VAGINAL HYSTERECTOMY WITH SALPINGECTOMY;  Surgeon: Thora Flint, MD;  Location: [facility]  ORS;  Service: Gynecology;  Laterality: Bilateral;   LEFT HEART CATH AND CORONARY ANGIOGRAPHY N/A 05/07/2023   Procedure: LEFT HEART CATH AND  CORONARY ANGIOGRAPHY;  Surgeon: Arleen Lacer, MD;  Location: [facility] INVASIVE CV LAB;  Service: Cardiovascular;  Laterality: N/A;   MYOMECTOMY ABDOMINAL APPROACH     THYROID SURGERY     tyroid     UPPER ESOPHAGEAL ENDOSCOPIC ULTRASOUND (EUS) N/A 12/22/2019   Procedure: UPPER ESOPHAGEAL ENDOSCOPIC ULTRASOUND (EUS);  Surgeon: Alvis Jourdain, MD;  Location: [facility];  Service: Endoscopy;  Laterality: N/A;     Outpatient Encounter Medications as of 08/25/2023  Medication Sig   aspirin  EC 81 MG tablet Take 1 tablet (81 mg total) by mouth daily. Swallow whole.   atorvastatin  (LIPITOR) 80 MG tablet Take 1 tablet (80 mg total) by mouth daily.   clindamycin  (CLEOCIN -T) 1 % lotion Apply topically as needed.   clopidogrel  (PLAVIX ) 75 MG tablet Take 1 tablet (75 mg total) by mouth daily.   doxycycline  (VIBRA -TABS) 100 MG tablet Take 1 tablet (100 mg total) by mouth 2 (two) times daily.   metoprolol  succinate (TOPROL  XL) 25 MG 24 hr tablet Take 1 tablet (25 mg total) by mouth daily. Hold if systolic BP (top number) is less than 100 and/or heart rate is less than 55   nitroGLYCERIN  (NITROSTAT ) 0.4 MG SL tablet Place 1 tablet (0.4 mg total) under the tongue every 5 (five) minutes as needed for chest pain.   ranolazine  (RANEXA ) 500 MG 12 hr tablet Take 1 tablet (500 mg total) by mouth 2 (two) times daily.   No facility-administered encounter medications on file as of 08/25/2023.     Today's Vitals   08/25/23 0958 08/25/23 0959 08/25/23 1001  BP: 132/70    Pulse: 77    Resp: 17    Temp: 97.7 F (36.5 C)    TempSrc: Temporal    SpO2: 99%    Weight: 169 lb 11.2 oz (77 kg)    Height: 5' 6 (1.676 m)    PainSc:  0-No pain 0-No pain   Body mass index is 27.39 kg/m.   ECOG PERFORMANCE STATUS: 1 - Symptomatic but completely ambulatory  PHYSICAL EXAM GENERAL:alert, no distress and comfortable SKIN: no rash  EYES: sclera clear LUNGS: clear with normal breathing effort HEART: regular rate & rhythm,  no lower extremity edema ABDOMEN: abdomen soft, non-tender and normal bowel sounds NEURO: alert & oriented x 3 with fluent speech, no focal motor/sensory deficits PAC without erythema, healed. LUE PICC without erythema   CBC    Latest Ref Rng & Units 08/25/2023    9:16 AM 08/11/2023    9:06 AM 08/04/2023   11:13 AM  CBC  WBC 4.0 - 10.5 K/uL 6.9  9.1  10.8   Hemoglobin 12.0 - 15.0 g/dL 78.2  95.6  21.3   Hematocrit 36.0 - 46.0 % 33.7  33.7  34.9   Platelets 150 - 400 K/uL 254  284  285       CMP     Latest Ref Rng & Units 08/25/2023    9:16 AM 08/11/2023    9:06 AM 08/04/2023   11:13 AM  CMP  Glucose 70 - 99 mg/dL 086  578  469   BUN 8 - 23 mg/dL 12  10  13    Creatinine 0.44 - 1.00 mg/dL 6.29  5.28  4.13   Sodium 135 - 145 mmol/L 139  138  138   Potassium 3.5 - 5.1 mmol/L  4.1  4.1  3.7   Chloride 98 - 111 mmol/L 104  103  102   CO2 22 - 32 mmol/L 30  29  27    Calcium  8.9 - 10.3 mg/dL 9.6  9.5  9.2   Total Protein 6.5 - 8.1 g/dL 7.5  7.7  7.5   Total Bilirubin 0.0 - 1.2 mg/dL 0.3  0.4  0.2   Alkaline Phos 38 - 126 U/L 122  149  157   AST 15 - 41 U/L 15  25  21    ALT 0 - 44 U/L 14  30  23        ASSESSMENT & PLAN::Lindsay [name] is a 68 y.o. female with    metastatic colon cancer to liver MSS, HER2 -, BRAF negative, [name]/[name] wildtype -diagnosed 12/2019 by porta hepatis LN biopsy during EUS for work up of abdominal pain and liver lesions seen on US  and MRI.  -Liver biopsy 01/08/20 confirmed metastasis from primary colorectal cancer. PET scan showed hypermetabolism to known liver mets, diffuse thoracic and abdominal lymphadenopathy, a 1.8 cm LUL pulmonary nodule, and splenic flexure of colon. -treated with first line FOLFOX 01/25/20 - 10/02/20, Vectibix  added with C2. Oxali discontinued after C18 due to reaction. -switched to Xeloda  10/21/20, dose adjusted due to significant skin toxicity -Last CT CAP 03/19/22 showed progression in liver and gastrohepatic ligament LN; she began  FOLFIRI/beva on 03/25/22 -Restaging CT's 06/16/22, 08/31/22, 12/14/22 and 03/24/23 showed stable disease despite rising CEA. CT 06/22/23 showed mild progression in central liver lesions, but within stable disease range by measurements, otherwise stable -We have ordered FO liquid biopsy and HER2 (which is negative) to see if she has new RAS mutations, if she remains [name]/[name]/BRAF negative/wildtype, will change Beva back to vectibix  to see if this achieves better disease control -Continues FOLFIRI and restarted vectibix  08/11/23 -Ms. Briney appears stable, tolerating FOLFIRI/vectibix  well overall. Mild constipation is well managed at home. Able to recover/function well with good PS. There is no clinical evidence of progression -Labs reviewed, proceed with FOLFIRI/vectibix  today without dose modification. 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 [facility]) -started after cycle 5 FOLFOX, oxaliplatin  dose reduced and eventually discontinued after reaction 09/18/20 -Persistent numbness at the feet and hands, mild overall and stable     PLAN: -Labs reviewed -Proceed with FOLFIRI/vectibix , no dose adjustments -F/up and next cycle in 2 weeks -Restage in late July   Orders Placed This Encounter  Procedures   CT CHEST ABDOMEN PELVIS W CONTRAST    Standing Status:   Future    Expected Date:   09/29/2023    Expiration Date:   08/24/2024    If indicated for the ordered procedure, I authorize the administration of contrast media per Radiology protocol:   Yes    Does the patient have a contrast media/X-ray dye allergy?:   No    Preferred imaging location?:   [facility]    If indicated for the ordered procedure, I authorize the administration of oral contrast media per Radiology protocol:   Yes   CEA (Access)-[facility] ONLY    Standing Status:   Standing    Number of Occurrences:   20    Expiration  Date:   08/24/2024      All questions were answered. The patient knows to call the clinic with any problems, questions or concerns. No barriers to learning were  detected.   Mayre Bury K Vivian Neuwirth, NP 08/25/2023

## 2023-08-25 ENCOUNTER — Encounter: Payer: Self-pay | Admitting: Nurse Practitioner

## 2023-08-25 ENCOUNTER — Inpatient Hospital Stay: Payer: Medicare (Managed Care)

## 2023-08-25 ENCOUNTER — Inpatient Hospital Stay: Payer: Medicare (Managed Care) | Admitting: Nurse Practitioner

## 2023-08-25 VITALS — BP 132/70 | HR 77 | Temp 97.7°F | Resp 17 | Ht 66.0 in | Wt 169.7 lb

## 2023-08-25 DIAGNOSIS — Z5112 Encounter for antineoplastic immunotherapy: Secondary | ICD-10-CM | POA: Diagnosis not present

## 2023-08-25 DIAGNOSIS — C221 Intrahepatic bile duct carcinoma: Secondary | ICD-10-CM | POA: Diagnosis not present

## 2023-08-25 DIAGNOSIS — C189 Malignant neoplasm of colon, unspecified: Secondary | ICD-10-CM

## 2023-08-25 DIAGNOSIS — C19 Malignant neoplasm of rectosigmoid junction: Secondary | ICD-10-CM | POA: Diagnosis not present

## 2023-08-25 DIAGNOSIS — Z95828 Presence of other vascular implants and grafts: Secondary | ICD-10-CM

## 2023-08-25 LAB — CBC WITH DIFFERENTIAL (CANCER CENTER ONLY)
Abs Immature Granulocytes: 0.01 10*3/uL (ref 0.00–0.07)
Basophils Absolute: 0.1 10*3/uL (ref 0.0–0.1)
Basophils Relative: 1 %
Eosinophils Absolute: 0.3 10*3/uL (ref 0.0–0.5)
Eosinophils Relative: 4 %
HCT: 33.7 % — ABNORMAL LOW (ref 36.0–46.0)
Hemoglobin: 11 g/dL — ABNORMAL LOW (ref 12.0–15.0)
Immature Granulocytes: 0 %
Lymphocytes Relative: 27 %
Lymphs Abs: 1.9 10*3/uL (ref 0.7–4.0)
MCH: 28.3 pg (ref 26.0–34.0)
MCHC: 32.6 g/dL (ref 30.0–36.0)
MCV: 86.6 fL (ref 80.0–100.0)
Monocytes Absolute: 0.5 10*3/uL (ref 0.1–1.0)
Monocytes Relative: 8 %
Neutro Abs: 4.2 10*3/uL (ref 1.7–7.7)
Neutrophils Relative %: 60 %
Platelet Count: 254 10*3/uL (ref 150–400)
RBC: 3.89 MIL/uL (ref 3.87–5.11)
RDW: 17.2 % — ABNORMAL HIGH (ref 11.5–15.5)
WBC Count: 6.9 10*3/uL (ref 4.0–10.5)
nRBC: 0 % (ref 0.0–0.2)

## 2023-08-25 LAB — CMP (CANCER CENTER ONLY)
ALT: 14 U/L (ref 0–44)
AST: 15 U/L (ref 15–41)
Albumin: 4.2 g/dL (ref 3.5–5.0)
Alkaline Phosphatase: 122 U/L (ref 38–126)
Anion gap: 5 (ref 5–15)
BUN: 12 mg/dL (ref 8–23)
CO2: 30 mmol/L (ref 22–32)
Calcium: 9.6 mg/dL (ref 8.9–10.3)
Chloride: 104 mmol/L (ref 98–111)
Creatinine: 0.71 mg/dL (ref 0.44–1.00)
GFR, Estimated: >60 mL/min (ref >60)
Glucose, Bld: 114 mg/dL — ABNORMAL HIGH (ref 70–99)
Potassium: 4.1 mmol/L (ref 3.5–5.1)
Sodium: 139 mmol/L (ref 135–145)
Total Bilirubin: 0.3 mg/dL (ref 0.0–1.2)
Total Protein: 7.5 g/dL (ref 6.5–8.1)

## 2023-08-25 LAB — MAGNESIUM: Magnesium: 1.9 mg/dL (ref 1.7–2.4)

## 2023-08-25 LAB — TOTAL PROTEIN, URINE DIPSTICK: Protein, ur: NEGATIVE mg/dL

## 2023-08-25 MED ORDER — ATROPINE SULFATE 1 MG/ML IV SOLN
INTRAVENOUS | Status: AC
Start: 2023-08-25 — End: 2023-08-25
  Filled 2023-08-25: qty 1

## 2023-08-25 MED ORDER — FAMOTIDINE IN NACL 20-0.9 MG/50ML-% IV SOLN
20.0000 mg | Freq: Once | INTRAVENOUS | Status: AC
  Administered 2023-08-25: 20 mg via INTRAVENOUS
  Filled 2023-08-25: qty 50

## 2023-08-25 MED ORDER — DEXAMETHASONE SODIUM PHOSPHATE 10 MG/ML IJ SOLN
10.0000 mg | Freq: Once | INTRAMUSCULAR | Status: AC
  Administered 2023-08-25: 10 mg via INTRAVENOUS
  Filled 2023-08-25: qty 1

## 2023-08-25 MED ORDER — PALONOSETRON HCL INJECTION 0.25 MG/5ML
0.2500 mg | Freq: Once | INTRAVENOUS | Status: AC
  Administered 2023-08-25: 0.25 mg via INTRAVENOUS
  Filled 2023-08-25: qty 5

## 2023-08-25 MED ORDER — HEPARIN SOD (PORK) LOCK FLUSH 100 UNIT/ML IV SOLN
500.0000 [IU] | Freq: Once | INTRAVENOUS | Status: DC | PRN
Start: 2023-08-25 — End: 2023-08-25

## 2023-08-25 MED ORDER — ATROPINE SULFATE 1 MG/ML IV SOLN
0.5000 mg | Freq: Once | INTRAVENOUS | Status: AC | PRN
  Administered 2023-08-25: 0.5 mg via INTRAVENOUS

## 2023-08-25 MED ORDER — SODIUM CHLORIDE 0.9 % IV SOLN
150.0000 mg/m2 | Freq: Once | INTRAVENOUS | Status: AC
  Administered 2023-08-25: 300 mg via INTRAVENOUS
  Filled 2023-08-25: qty 15

## 2023-08-25 MED ORDER — SODIUM CHLORIDE 0.9% FLUSH
10.0000 mL | Freq: Once | INTRAVENOUS | Status: AC
  Administered 2023-08-25: 10 mL

## 2023-08-25 MED ORDER — SODIUM CHLORIDE 0.9% FLUSH: 10.0000 mL | INTRAVENOUS | Status: DC | PRN

## 2023-08-25 MED ORDER — SODIUM CHLORIDE 0.9 % IV SOLN
6.0000 mg/kg | Freq: Once | INTRAVENOUS | Status: AC
  Administered 2023-08-25: 500 mg via INTRAVENOUS
  Filled 2023-08-25: qty 5

## 2023-08-25 MED ORDER — SODIUM CHLORIDE 0.9 % IV SOLN
400.0000 mg/m2 | Freq: Once | INTRAVENOUS | Status: AC
  Administered 2023-08-25: 768 mg via INTRAVENOUS
  Filled 2023-08-25: qty 25

## 2023-08-25 MED ORDER — SODIUM CHLORIDE 0.9 % IV SOLN
2000.0000 mg/m2 | INTRAVENOUS | Status: DC
  Administered 2023-08-25: 3500 mg via INTRAVENOUS
  Filled 2023-08-25: qty 70

## 2023-08-25 MED ORDER — SODIUM CHLORIDE 0.9 % IV SOLN: INTRAVENOUS | Status: DC

## 2023-08-25 NOTE — Patient Instructions (Signed)
 [facility] - A DEPT OF [facility]  Discharge Instructions: Thank you for choosing [facility] to provide your oncology and hematology care.   If you have a lab appointment with the Cancer Center, please go directly to the Cancer Center and check in at the registration area.   Wear comfortable clothing and clothing appropriate for easy access to any Portacath or PICC line.   We strive to give you quality time with your provider. You may need to reschedule your appointment if you arrive late (15 or more minutes).  Arriving late affects you and other patients whose appointments are after yours.  Also, if you miss three or more appointments without notifying the office, you may be dismissed from the clinic at the provider's discretion.      For prescription refill requests, have your pharmacy contact our office and allow 72 hours for refills to be completed.    Today you received the following chemotherapy and/or immunotherapy agents: Panitumumab  (Vectibix ), Irinotecan /Leucovorin /5FU pump      To help prevent nausea and vomiting after your treatment, we encourage you to take your nausea medication as directed.  BELOW ARE SYMPTOMS THAT SHOULD BE REPORTED IMMEDIATELY: *FEVER GREATER THAN 100.4 F (38 C) OR HIGHER *CHILLS OR SWEATING *NAUSEA AND VOMITING THAT IS NOT CONTROLLED WITH YOUR NAUSEA MEDICATION *UNUSUAL SHORTNESS OF BREATH *UNUSUAL BRUISING OR BLEEDING *URINARY PROBLEMS (pain or burning when urinating, or frequent urination) *BOWEL PROBLEMS (unusual diarrhea, constipation, pain near the anus) TENDERNESS IN MOUTH AND THROAT WITH OR WITHOUT PRESENCE OF ULCERS (sore throat, sores in mouth, or a toothache) UNUSUAL RASH, SWELLING OR PAIN  UNUSUAL VAGINAL DISCHARGE OR ITCHING   Items with * indicate a potential emergency and should be followed up as soon as possible or go to the Emergency Department if any problems should occur.  Please show  the CHEMOTHERAPY ALERT CARD or IMMUNOTHERAPY ALERT CARD at check-in to the Emergency Department and triage nurse.  Should you have questions after your visit or need to cancel or reschedule your appointment, please contact [facility] - A DEPT OF [facility]  Dept: [phone]  and follow the prompts.  Office hours are 8:00 a.m. to 4:30 p.m. Monday - Friday. Please note that voicemails left after 4:00 p.m. may not be returned until the following business day.  We are closed weekends and major holidays. You have access to a nurse at all times for urgent questions. Please call the main number to the clinic Dept: [phone] and follow the prompts.   For any non-urgent questions, you may also contact your provider using MyChart. We now offer e-Visits for anyone 62 and older to request care online for non-urgent symptoms. For details visit [email or web address].   Also download the MyChart app! Go to the app store, search "MyChart", open the app, select [place], and log in with your MyChart username and password.  Panitumumab  Injection What is this medication? PANITUMUMAB  (pan i TOOM ue mab) treats colorectal cancer. It works by blocking a protein that causes cancer cells to grow and multiply. This helps to slow or stop the spread of cancer cells. It is a monoclonal antibody. This medicine may be used for other purposes; ask your health care provider or pharmacist if you have questions. COMMON BRAND NAME(S): Vectibix  What should I tell my care team before I take this medication? They need to know if you have any of  these conditions: Eye disease Low levels of magnesium  in the blood Lung disease An unusual or allergic reaction to panitumumab , other medications, foods, dyes, or preservatives Pregnant or trying to get pregnant Breast-feeding How should I use this medication? This medication is injected into a vein. It is given by your care team in a hospital or  clinic setting. Talk to your care team about the use of this medication in children. Special care may be needed. Overdosage: If you think you have taken too much of this medicine contact a poison control center or emergency room at once. NOTE: This medicine is only for you. Do not share this medicine with others. What if I miss a dose? Keep appointments for follow-up doses. It is important not to miss your dose. Call your care team if you are unable to keep an appointment. What may interact with this medication? Bevacizumab  This list may not describe all possible interactions. Give your health care provider a list of all the medicines, herbs, non-prescription drugs, or dietary supplements you use. Also tell them if you smoke, drink alcohol, or use illegal drugs. Some items may interact with your medicine. What should I watch for while using this medication? Your condition will be monitored carefully while you are receiving this medication. This medication may make you feel generally unwell. This is not uncommon as chemotherapy can affect healthy cells as well as cancer cells. Report any side effects. Continue your course of treatment even though you feel ill unless your care team tells you to stop. This medication can make you more sensitive to the sun. Keep out of the sun while receiving this medication and for 2 months after stopping therapy. If you cannot avoid being in the sun, wear protective clothing and sunscreen. Do not use sun lamps, tanning beds, or tanning booths. Check with your care team if you have severe diarrhea, nausea, and vomiting or if you sweat a lot. The loss of too much body fluid may make it dangerous for you to take this medication. This medication may cause serious skin reactions. They can happen weeks to months after starting the medication. Contact your care team right away if you notice fevers or flu-like symptoms with a rash. The rash may be red or purple and then turn  into blisters or peeling of the skin. You may also notice a red rash with swelling of the face, lips, or lymph nodes in your neck or under your arms. Talk to your care team if you may be pregnant. Serious birth defects can occur if you take this medication during pregnancy and for 2 months after the last dose. Contraception is recommended while taking this medication and for 2 months after the last dose. Your care team can help you find the option that works for you. Do not breastfeed while taking this medication and for 2 months after the last dose. This medication may cause infertility. Talk to your care team if you are concerned about your fertility. What side effects may I notice from receiving this medication? Side effects that you should report to your care team as soon as possible: Allergic reactions--skin rash, itching, hives, swelling of the face, lips, tongue, or throat Dry cough, shortness of breath or trouble breathing Eye pain, redness, irritation, or discharge with blurry or decreased vision Infusion reactions--chest pain, shortness of breath or trouble breathing, feeling faint or lightheaded Low magnesium  level--muscle pain or cramps, unusual weakness or fatigue, fast or irregular heartbeat, tremors Low potassium level--muscle  pain or cramps, unusual weakness or fatigue, fast or irregular heartbeat, constipation Redness, blistering, peeling, or loosening of the skin, including inside the mouth Skin reactions on sun-exposed areas Side effects that usually do not require medical attention (report to your care team if they continue or are bothersome): Change in nail shape, thickness, or color Diarrhea Dry skin Fatigue Nausea Vomiting This list may not describe all possible side effects. Call your doctor for medical advice about side effects. You may report side effects to FDA at 1-800-FDA-1088. Where should I keep my medication? This medication is given in a hospital or clinic. It  will not be stored at home. NOTE: This sheet is a summary. It may not cover all possible information. If you have questions about this medicine, talk to your doctor, pharmacist, or health care provider.  2024 Elsevier/Gold Standard (2021-07-09 00:00:00)

## 2023-08-25 NOTE — Addendum Note (Signed)
 Encounter addended by: Karolynn Pack on: 08/25/2023 4:24 PM  Actions taken: Imaging Exam ended

## 2023-08-27 ENCOUNTER — Inpatient Hospital Stay: Payer: Medicare (Managed Care)

## 2023-08-27 VITALS — BP 113/72 | HR 69 | Resp 16

## 2023-08-27 DIAGNOSIS — C19 Malignant neoplasm of rectosigmoid junction: Secondary | ICD-10-CM | POA: Diagnosis not present

## 2023-08-27 DIAGNOSIS — Z5112 Encounter for antineoplastic immunotherapy: Secondary | ICD-10-CM | POA: Diagnosis not present

## 2023-08-27 DIAGNOSIS — C221 Intrahepatic bile duct carcinoma: Secondary | ICD-10-CM

## 2023-08-27 MED ORDER — SODIUM CHLORIDE 0.9% FLUSH
10.0000 mL | INTRAVENOUS | Status: DC | PRN
  Administered 2023-08-27: 10 mL

## 2023-08-27 MED ORDER — HEPARIN SOD (PORK) LOCK FLUSH 100 UNIT/ML IV SOLN
500.0000 [IU] | Freq: Once | INTRAVENOUS | Status: AC | PRN
  Administered 2023-08-27: 250 [IU]

## 2023-08-31 ENCOUNTER — Other Ambulatory Visit (HOSPITAL_COMMUNITY): Payer: Self-pay

## 2023-08-31 ENCOUNTER — Inpatient Hospital Stay: Payer: Medicare (Managed Care)

## 2023-08-31 DIAGNOSIS — C221 Intrahepatic bile duct carcinoma: Secondary | ICD-10-CM

## 2023-08-31 DIAGNOSIS — Z5112 Encounter for antineoplastic immunotherapy: Secondary | ICD-10-CM | POA: Diagnosis not present

## 2023-08-31 DIAGNOSIS — C19 Malignant neoplasm of rectosigmoid junction: Secondary | ICD-10-CM | POA: Diagnosis not present

## 2023-08-31 DIAGNOSIS — Z95828 Presence of other vascular implants and grafts: Secondary | ICD-10-CM

## 2023-08-31 MED ORDER — HEPARIN SOD (PORK) LOCK FLUSH 100 UNIT/ML IV SOLN
250.0000 [IU] | Freq: Once | INTRAVENOUS | Status: DC
Start: 2023-08-31 — End: 2023-08-31

## 2023-08-31 MED ORDER — HEPARIN SOD (PORK) LOCK FLUSH 100 UNIT/ML IV SOLN
250.0000 [IU] | Freq: Once | INTRAVENOUS | Status: AC
  Administered 2023-08-31: 250 [IU]

## 2023-08-31 MED ORDER — SODIUM CHLORIDE 0.9% FLUSH
10.0000 mL | Freq: Once | INTRAVENOUS | Status: AC
  Administered 2023-08-31: 10 mL

## 2023-09-03 ENCOUNTER — Telehealth: Payer: Self-pay | Admitting: Cardiology

## 2023-09-03 DIAGNOSIS — R072 Precordial pain: Secondary | ICD-10-CM

## 2023-09-03 DIAGNOSIS — I25118 Atherosclerotic heart disease of native coronary artery with other forms of angina pectoris: Secondary | ICD-10-CM

## 2023-09-03 MED ORDER — NITROGLYCERIN 0.4 MG SL SUBL: 0.4000 mg | SUBLINGUAL_TABLET | SUBLINGUAL | 11 refills | Status: DC | PRN

## 2023-09-03 NOTE — Telephone Encounter (Signed)
*  STAT* If patient is at the pharmacy, call can be transferred to refill team.   1. Which medications need to be refilled? (please list name of each medication and dose if known)   nitroGLYCERIN  (NITROSTAT ) 0.4 MG SL tablet     4. Which pharmacy/location (including street and city if local pharmacy) is medication to be sent to?   nitroGLYCERIN  (NITROSTAT ) 0.4 MG SL tablet     5. Do they need a 30 day or 90 day supply? 90

## 2023-09-03 NOTE — Telephone Encounter (Signed)
 Pt's medication was sent to pt's pharmacy as requested. Confirmation received.

## 2023-09-03 NOTE — Telephone Encounter (Signed)
   [facility] - [place] ([place]), [place] - [address]

## 2023-09-06 NOTE — Assessment & Plan Note (Signed)
 MSS, [name]/[name] wildtype -diagnosed 12/2019 by porta hepatis LN biopsy during EUS for work up of abdominal pain and liver lesions seen on US  and MRI. Liver biopsy 01/08/20 confirmed metastasis from primary colorectal cancer. PET scan showed hypermetabolism to known liver mets, diffuse thoracic and abdominal lymphadenopathy, a 1.8 cm LUL pulmonary nodule, and splenic flexure of colon. -treated with first line FOLFOX 01/25/20 - 10/02/20, Vectibix  added with C2. Oxali discontinued after C18 due to reaction. -switched to Xeloda  10/21/20, dose adjusted due to significant skin toxicity -due to cancer progression, treatment has been changed to FOLFIRI and bevacizumab  on 03/25/22, she is overall tolerating well -will repeat CT scan after next cycle chemo  -Restaging CT scan from 06/16/2022 showed stable to mild decrease in size of treated liver metastasis. No new lesions -She is tolerating chemotherapy well overall, will continue -Her restaging CT scan on August 31, 2022 showed stable liver metastasis, and a stable 7 mm left lung nodule, indeterminate.  I personally reviewed the scan images with patient -Will continue current therapy.  We discussed the option of maintenance therapy with irinotecan  and bevacizumab  in future.  She is tolerating current FOLFIRI and bevacizumab  well, will continue. -Restaging CT 03/24/2023 showed stable disease.  -restaging CT in 06/2023 showed mild disease progression in liver, I change Beva to vectibix 

## 2023-09-07 ENCOUNTER — Inpatient Hospital Stay: Payer: Medicare (Managed Care) | Admitting: Hematology

## 2023-09-07 ENCOUNTER — Inpatient Hospital Stay: Payer: Medicare (Managed Care) | Attending: Hematology

## 2023-09-07 ENCOUNTER — Encounter: Payer: Self-pay | Admitting: Hematology

## 2023-09-07 ENCOUNTER — Inpatient Hospital Stay: Payer: Medicare (Managed Care)

## 2023-09-07 VITALS — BP 128/85 | HR 70 | Temp 98.4°F | Resp 18

## 2023-09-07 VITALS — BP 136/70 | HR 76 | Temp 97.4°F | Resp 15 | Ht 66.0 in | Wt 168.8 lb

## 2023-09-07 DIAGNOSIS — R21 Rash and other nonspecific skin eruption: Secondary | ICD-10-CM | POA: Diagnosis not present

## 2023-09-07 DIAGNOSIS — C221 Intrahepatic bile duct carcinoma: Secondary | ICD-10-CM

## 2023-09-07 DIAGNOSIS — Z90710 Acquired absence of both cervix and uterus: Secondary | ICD-10-CM | POA: Insufficient documentation

## 2023-09-07 DIAGNOSIS — D649 Anemia, unspecified: Secondary | ICD-10-CM | POA: Insufficient documentation

## 2023-09-07 DIAGNOSIS — C787 Secondary malignant neoplasm of liver and intrahepatic bile duct: Secondary | ICD-10-CM | POA: Insufficient documentation

## 2023-09-07 DIAGNOSIS — Z5112 Encounter for antineoplastic immunotherapy: Secondary | ICD-10-CM | POA: Diagnosis present

## 2023-09-07 DIAGNOSIS — K5900 Constipation, unspecified: Secondary | ICD-10-CM | POA: Insufficient documentation

## 2023-09-07 DIAGNOSIS — Z79899 Other long term (current) drug therapy: Secondary | ICD-10-CM | POA: Insufficient documentation

## 2023-09-07 DIAGNOSIS — Z452 Encounter for adjustment and management of vascular access device: Secondary | ICD-10-CM | POA: Diagnosis not present

## 2023-09-07 DIAGNOSIS — T451X5D Adverse effect of antineoplastic and immunosuppressive drugs, subsequent encounter: Secondary | ICD-10-CM | POA: Diagnosis not present

## 2023-09-07 DIAGNOSIS — G620 Drug-induced polyneuropathy: Secondary | ICD-10-CM | POA: Insufficient documentation

## 2023-09-07 DIAGNOSIS — R197 Diarrhea, unspecified: Secondary | ICD-10-CM | POA: Diagnosis not present

## 2023-09-07 DIAGNOSIS — C19 Malignant neoplasm of rectosigmoid junction: Secondary | ICD-10-CM | POA: Diagnosis present

## 2023-09-07 DIAGNOSIS — Z95828 Presence of other vascular implants and grafts: Secondary | ICD-10-CM

## 2023-09-07 DIAGNOSIS — Z5111 Encounter for antineoplastic chemotherapy: Secondary | ICD-10-CM | POA: Diagnosis not present

## 2023-09-07 DIAGNOSIS — C7800 Secondary malignant neoplasm of unspecified lung: Secondary | ICD-10-CM | POA: Diagnosis not present

## 2023-09-07 DIAGNOSIS — C189 Malignant neoplasm of colon, unspecified: Secondary | ICD-10-CM

## 2023-09-07 LAB — CBC WITH DIFFERENTIAL (CANCER CENTER ONLY)
Abs Immature Granulocytes: 0.01 10*3/uL (ref 0.00–0.07)
Basophils Absolute: 0.1 10*3/uL (ref 0.0–0.1)
Basophils Relative: 1 %
Eosinophils Absolute: 0.2 10*3/uL (ref 0.0–0.5)
Eosinophils Relative: 4 %
HCT: 33.8 % — ABNORMAL LOW (ref 36.0–46.0)
Hemoglobin: 10.9 g/dL — ABNORMAL LOW (ref 12.0–15.0)
Immature Granulocytes: 0 %
Lymphocytes Relative: 27 %
Lymphs Abs: 1.3 10*3/uL (ref 0.7–4.0)
MCH: 27.9 pg (ref 26.0–34.0)
MCHC: 32.2 g/dL (ref 30.0–36.0)
MCV: 86.7 fL (ref 80.0–100.0)
Monocytes Absolute: 0.5 10*3/uL (ref 0.1–1.0)
Monocytes Relative: 10 %
Neutro Abs: 2.9 10*3/uL (ref 1.7–7.7)
Neutrophils Relative %: 58 %
Platelet Count: 269 10*3/uL (ref 150–400)
RBC: 3.9 MIL/uL (ref 3.87–5.11)
RDW: 17.1 % — ABNORMAL HIGH (ref 11.5–15.5)
WBC Count: 5 10*3/uL (ref 4.0–10.5)
nRBC: 0 % (ref 0.0–0.2)

## 2023-09-07 LAB — TOTAL PROTEIN, URINE DIPSTICK: Protein, ur: NEGATIVE mg/dL

## 2023-09-07 LAB — CMP (CANCER CENTER ONLY)
ALT: 15 U/L (ref 0–44)
AST: 15 U/L (ref 15–41)
Albumin: 4.2 g/dL (ref 3.5–5.0)
Alkaline Phosphatase: 107 U/L (ref 38–126)
Anion gap: 7 (ref 5–15)
BUN: 11 mg/dL (ref 8–23)
CO2: 28 mmol/L (ref 22–32)
Calcium: 9.6 mg/dL (ref 8.9–10.3)
Chloride: 103 mmol/L (ref 98–111)
Creatinine: 0.71 mg/dL (ref 0.44–1.00)
GFR, Estimated: >60 mL/min (ref >60)
Glucose, Bld: 124 mg/dL — ABNORMAL HIGH (ref 70–99)
Potassium: 3.8 mmol/L (ref 3.5–5.1)
Sodium: 138 mmol/L (ref 135–145)
Total Bilirubin: 0.3 mg/dL (ref 0.0–1.2)
Total Protein: 7.6 g/dL (ref 6.5–8.1)

## 2023-09-07 LAB — MAGNESIUM: Magnesium: 1.9 mg/dL (ref 1.7–2.4)

## 2023-09-07 LAB — CEA (ACCESS): CEA (CHCC): 46.88 ng/mL — ABNORMAL HIGH (ref 0.00–5.00)

## 2023-09-07 MED ORDER — SODIUM CHLORIDE 0.9 % IV SOLN
2000.0000 mg/m2 | INTRAVENOUS | Status: DC
  Administered 2023-09-07: 3500 mg via INTRAVENOUS
  Filled 2023-09-07: qty 70

## 2023-09-07 MED ORDER — SODIUM CHLORIDE 0.9% FLUSH: 10.0000 mL | INTRAVENOUS | Status: DC | PRN

## 2023-09-07 MED ORDER — SODIUM CHLORIDE 0.9 % IV SOLN: INTRAVENOUS | Status: DC

## 2023-09-07 MED ORDER — SODIUM CHLORIDE 0.9 % IV SOLN
6.0000 mg/kg | Freq: Once | INTRAVENOUS | Status: AC
  Administered 2023-09-07: 500 mg via INTRAVENOUS
  Filled 2023-09-07: qty 20

## 2023-09-07 MED ORDER — FAMOTIDINE IN NACL 20-0.9 MG/50ML-% IV SOLN
20.0000 mg | Freq: Once | INTRAVENOUS | Status: AC
  Administered 2023-09-07: 20 mg via INTRAVENOUS
  Filled 2023-09-07: qty 50

## 2023-09-07 MED ORDER — DEXAMETHASONE SODIUM PHOSPHATE 10 MG/ML IJ SOLN
10.0000 mg | Freq: Once | INTRAMUSCULAR | Status: AC
  Administered 2023-09-07: 10 mg via INTRAVENOUS
  Filled 2023-09-07: qty 1

## 2023-09-07 MED ORDER — SODIUM CHLORIDE 0.9% FLUSH
10.0000 mL | Freq: Once | INTRAVENOUS | Status: AC
  Administered 2023-09-07: 10 mL

## 2023-09-07 MED ORDER — HEPARIN SOD (PORK) LOCK FLUSH 100 UNIT/ML IV SOLN: 500.0000 [IU] | Freq: Once | INTRAVENOUS | Status: DC | PRN

## 2023-09-07 MED ORDER — SODIUM CHLORIDE 0.9 % IV SOLN
150.0000 mg/m2 | Freq: Once | INTRAVENOUS | Status: AC
  Administered 2023-09-07: 300 mg via INTRAVENOUS
  Filled 2023-09-07: qty 15

## 2023-09-07 MED ORDER — PALONOSETRON HCL INJECTION 0.25 MG/5ML
0.2500 mg | Freq: Once | INTRAVENOUS | Status: AC
  Administered 2023-09-07: 0.25 mg via INTRAVENOUS
  Filled 2023-09-07: qty 5

## 2023-09-07 MED ORDER — SODIUM CHLORIDE 0.9 % IV SOLN
400.0000 mg/m2 | Freq: Once | INTRAVENOUS | Status: AC
  Administered 2023-09-07: 768 mg via INTRAVENOUS
  Filled 2023-09-07: qty 25

## 2023-09-07 NOTE — Progress Notes (Signed)
 [facility]   [phone] [phone]   Clinic Follow up Note   Patient Care Team: [name], MD as PCP - General (Internal Medicine) [name], DO as PCP - Cardiology (Cardiology) [name], MD as Consulting Physician (Oncology) [name], MD as Consulting Physician (Gastroenterology)  Date of Service:  09/07/2023  CHIEF COMPLAINT: f/u of metastatic colon cancer  CURRENT THERAPY:  FOLFIRI and Vectibix   Oncology History   metastatic colon 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 Liver biopsy 01/08/20 confirmed metastasis from primary colorectal cancer. PET scan showed hypermetabolism to known liver mets, diffuse thoracic and abdominal lymphadenopathy, a 1.8 cm LUL pulmonary nodule, and splenic flexure of colon. -treated with first line FOLFOX 01/25/20 - 10/02/20, Vectibix  added with C2. Oxali discontinued after C18 due to reaction. -switched to Xeloda  10/21/20, dose adjusted due to significant skin toxicity -due to cancer progression, treatment has been changed to FOLFIRI and bevacizumab  on 03/25/22, she is overall tolerating well -will repeat CT scan after next cycle chemo  -Restaging CT scan from 06/16/2022 showed stable to mild decrease in size of treated liver metastasis. No new lesions -She is tolerating chemotherapy well overall, will continue -Her restaging CT scan on August 31, 2022 showed stable liver metastasis, and a stable 7 mm left lung nodule, indeterminate.  I personally reviewed the scan images with patient -Will continue current therapy.  We discussed the option of maintenance therapy with irinotecan  and bevacizumab  in future.  She is tolerating current FOLFIRI and bevacizumab  well, will continue. -Restaging CT 03/24/2023 showed stable disease.  -restaging CT in 06/2023 showed mild disease progression in liver, I  change Beva to vectibix    Assessment & Plan Metastatic colon cancer Currently undergoing treatment with Fulferi and Vectibix . She is on cycle three of Vectibix , experiencing diarrhea as a side effect. Reports weight loss and decreased appetite, but no nausea. Mild skin rash present. Blood counts show mild anemia with hemoglobin at 10.9. Kidney and liver functions are normal, allowing continuation of treatment. Plan to monitor tumor marker CEA, with the last result in April being 90. Current CEA result is pending. - Continue Fulferi and Vectibix  treatment - Monitor tumor marker CEA - Order CT scan for July 21 - Schedule next infusion for July 17 - Plan to scan after cycle five, beginning of August  Diarrhea Diarrhea likely due to Vectibix  treatment. She took Imodium, resulting in constipation. - Monitor bowel movements closely due to potential side effects of Vectibix   Constipation Intermittent constipation likely exacerbated by Imodium use following diarrhea. Constipation has resolved after treatment. - Advise to use lower dose of Imodium to prevent constipation  Mild anemia Mild anemia with hemoglobin at 10.9. No current intervention required as treatment can continue.  Plan - Lab reviewed, adequate for treatment, will proceed chemotherapy today and continue every 2 weeks - She is scheduled for restaging CT scan after next cycle chemo - Follow-up in 2 weeks   SUMMARY OF ONCOLOGIC HISTORY: Oncology History  metastatic colon cancer to liver  06/20/2019 Procedure   Colonoscopy by Dr Wilhelmenia 06/20/19  IMPRESSION -Seven 3 to 10 mm polyps in the sigmoid colon, in the transverse colon and in the escending colon, removed with a cold snare. Resected and retrireved.  -One 20mm polyp in the descending colon. Biopsies. Tattoes.  -Mediaum sized lipoma in the ascending colon.  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 See Comment B. Colon, Ascending, polyp, Polypectomy:  -TUBULAR ADENOMA -No high grade dysplasia or malignancy.  C. Colon, TRansverse, Polyo, polectomy:  -TUBULAR ADENOMA -No high grade dysplasia or malignancy.  D. Colon, Sigmoid, Polyp, Polypectomy:  -HYPERPLASTIC POLYP   11/07/2019 Imaging   US  Abdomen 11/07/19  IMPRESSION: 1. Two solid masses in the liver are nonspecific. Recommend MRI abdomen with and without contrast for further evaluation.   12/15/2019 Imaging   MRI Abdomen 12/15/19  IMPRESSION: 1. There are two large masses in the liver with appearance favoring metastatic disease or hepatocellular carcinoma or cholangiocarcinoma. A benign etiology is highly unlikely given the enhancement pattern and associated adenopathy. 2. Considerable porta hepatis and retroperitoneal adenopathy. Some of the confluent porta hepatis tumor is potentially infiltrative and abuts the pancreatic body along its right upper margin, making it difficult to completely exclude the possibility of pancreatic adenocarcinoma primary. Possibilities helpful in further workup might include tissue diagnosis, endoscopic ultrasound, or nuclear medicine PET-CT. 3. Pancreas divisum. 4. Lumbar spondylosis and degenerative disc disease. 5. Despite efforts by the technologist and patient, motion artifact is present on today's exam and could not be eliminated. This reduces exam sensitivity and specificity.   12/22/2019 Procedure   Upper Endoscopy by Dr Rollin 12/22/19  IMPRESSION - One lymph node was visualized and measured in the porta hepatis region. Fine needle aspiration performed    12/22/2019 Initial Biopsy   A. LIVER, PORTA HEPATIS MASS, FINE NEEDLE 12/22/19 ASPIRATION:  FINAL MICROSCOPIC DIAGNOSIS:  - Malignant cells consistent with metastatic adenocarcinoma   01/01/2020 Initial Diagnosis   Intrahepatic cholangiocarcinoma (HCC)   01/08/2020 Initial Biopsy   FINAL MICROSCOPIC DIAGNOSIS:    A. LIVER, LEFT LOBE, BIOPSY:  - Metastatic adenocarcinoma, consistent with a colorectal primary.  See  comment      COMMENT:   Immunohistochemical stains show the tumor cells are positive for CK20  and CDX2 but negative for CK7, consistent with above interpretation.  Dr. 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   01/24/2020 Procedure   PAC placed 01/24/20   01/25/2020 -  Chemotherapy   first line FOLFOX starting 01/25/2020, Vextibix  added with C2 (02/06/20)   02/06/2020 - 02/06/2022 Chemotherapy   Patient is on Treatment Plan : COLORECTAL Panitumumab  q14d ([name] Wild - Type Gene Only)     06/20/2020 Imaging   CT CAP  IMPRESSION: 1. Continued interval reduction in size and conspicuity of a subsolid nodule of the peripheral left upper lobe. 2. Unchanged prominent pretracheal and subcarinal lymph nodes. 3. Redemonstrated partially calcified low-attenuation liver masses, slightly decreased in size compared to prior examination. 4. Slight interval decrease in size of a portacaval lymph node or conglomerate and retroperitoneal lymph nodes. 5. Findings are consistent with continued treatment response of nodal, pulmonary, and hepatic metastatic disease. 6. Coronary artery disease.   Aortic Atherosclerosis (ICD10-I70.0).     10/09/2020 Imaging   IMPRESSION: 1. Slight decrease in size of dominant liver mass with smaller liver mass within 1-2 mm of prior measurement. 2. Stable appearance of celiac lymph and retroperitoneal lymph nodes. Dominant node with calcification in the gastrohepatic ligament as described. 3. Continued decrease in size of LEFT upper lobe nodule. 4. Three-vessel coronary artery calcification. 5. Aortic atherosclerosis.   03/25/2022 - 07/16/2023 Chemotherapy  Patient is on Treatment Plan : COLORECTAL FOLFIRI + Bevacizumab   q14d     08/31/2022 Imaging    IMPRESSION: 1. Unchanged, densely calcified liver lesions, consistent with treated metastatic disease. 2. Unchanged, irregular subpleural opacity of the peripheral left upper lobe. 3. Unchanged subcentimeter gastrohepatic ligament lymph node. 4. No evidence of new metastatic disease in the chest, abdomen, or pelvis. 5. Status post hysterectomy. 6. Coronary artery disease. 7. Aortic valve calcifications. Correlate for echocardiographic evidence of aortic valve dysfunction.     06/22/2023 Imaging   CT Chest, Abdomen, and pelvis with contrast  IMPRESSION: Centrally calcified liver lesions are again seen and slightly increased today. Prominent nodes in the upper retroperitoneum are overall similar. Attention on follow-up.   Stable branching left upper lobe lung nodule.   08/11/2023 -  Chemotherapy   Patient is on Treatment Plan : COLORECTAL FOLFIRI + Panitumumab  q14d        Discussed the use of AI scribe software for clinical note transcription with the patient, who gave verbal consent to proceed.  History of Present Illness Lindsay [name] is a 68 year old female with metastatic colon cancer who presents for follow-up.  She experiences alternating episodes of diarrhea and constipation. Imodium use for diarrhea led to constipation, which has resolved with further medication. There is a decrease in appetite and weight loss despite dietary efforts. No nausea or pain is present. A mild skin rash is noted, but no other skin issues are reported.     All other systems were reviewed with the patient and are negative.  MEDICAL HISTORY:  Past Medical History:  Diagnosis Date   Arthritis    Diabetes mellitus without complication (HCC)    Hypertension    met colon ca to liver 01/2020    SURGICAL HISTORY: Past Surgical History:  Procedure Laterality Date   ABDOMINAL HYSTERECTOMY     ANTERIOR AND POSTERIOR REPAIR WITH SACROSPINOUS FIXATION N/A 07/16/2016    Procedure: ANTERIOR AND POSTERIOR REPAIR WITH SACROSPINOUS FIXATION;  Surgeon: Curlene Agent, MD;  Location: [facility];  Service: Gynecology;  Laterality: N/A;   CYSTOSCOPY  07/16/2016   Procedure: CYSTOSCOPY;  Surgeon: Curlene Agent, MD;  Location: [facility];  Service: Gynecology;;   ESOPHAGOGASTRODUODENOSCOPY (EGD) WITH PROPOFOL  N/A 12/22/2019   Procedure: ESOPHAGOGASTRODUODENOSCOPY (EGD) WITH PROPOFOL ;  Surgeon: [name], MD;  Location: [facility];  Service: Endoscopy;  Laterality: N/A;   FINE NEEDLE ASPIRATION N/A 12/22/2019   Procedure: FINE NEEDLE ASPIRATION (FNA) LINEAR;  Surgeon: [name], MD;  Location: [facility];  Service: Endoscopy;  Laterality: N/A;   IR IMAGING GUIDED PORT INSERTION  01/24/2020   IR REMOVAL TUN ACCESS W/ PORT W/O FL MOD SED  07/30/2023   LAPAROSCOPIC VAGINAL HYSTERECTOMY WITH SALPINGECTOMY Bilateral 07/16/2016   Procedure: LAPAROSCOPIC ASSISTED VAGINAL HYSTERECTOMY WITH SALPINGECTOMY;  Surgeon: Curlene Agent, MD;  Location: [facility];  Service: Gynecology;  Laterality: Bilateral;   LEFT HEART CATH AND CORONARY ANGIOGRAPHY N/A 05/07/2023   Procedure: LEFT HEART CATH AND CORONARY ANGIOGRAPHY;  Surgeon: Anner Alm ORN, MD;  Location: [facility];  Service: Cardiovascular;  Laterality: N/A;   MYOMECTOMY ABDOMINAL APPROACH     THYROID SURGERY     tyroid     UPPER ESOPHAGEAL ENDOSCOPIC ULTRASOUND (EUS) N/A 12/22/2019   Procedure: UPPER ESOPHAGEAL ENDOSCOPIC ULTRASOUND (EUS);  Surgeon: [name], MD;  Location: [facility];  Service: Endoscopy;  Laterality: N/A;    I have reviewed the social history and family history with the patient and they are  unchanged from previous note.  ALLERGIES:  is allergic to oxaliplatin .  MEDICATIONS:  Current Outpatient Medications  Medication Sig Dispense Refill   aspirin  EC 81 MG tablet Take 1 tablet (81 mg total) by mouth daily. Swallow whole. 90 tablet 0   atorvastatin  (LIPITOR) 80 MG tablet Take 1 tablet (80  mg total) by mouth daily. 90 tablet 0   clindamycin  (CLEOCIN -T) 1 % lotion Apply topically as needed. 60 mL 0   clopidogrel  (PLAVIX ) 75 MG tablet Take 1 tablet (75 mg total) by mouth daily. 90 tablet 0   doxycycline  (VIBRA -TABS) 100 MG tablet Take 1 tablet (100 mg total) by mouth 2 (two) times daily. 60 tablet 3   metoprolol  succinate (TOPROL  XL) 25 MG 24 hr tablet Take 1 tablet (25 mg total) by mouth daily. Hold if systolic BP (top number) is less than 100 and/or heart rate is less than 55 90 tablet 3   nitroGLYCERIN  (NITROSTAT ) 0.4 MG SL tablet Place 1 tablet (0.4 mg total) under the tongue every 5 (five) minutes as needed for chest pain. 25 tablet 11   ranolazine  (RANEXA ) 500 MG 12 hr tablet Take 1 tablet (500 mg total) by mouth 2 (two) times daily. 60 tablet 3   No current facility-administered medications for this visit.   Facility-Administered Medications Ordered in Other Visits  Medication Dose Route Frequency Provider Last Rate Last Admin   0.9 %  sodium chloride  infusion   Intravenous Continuous [name], MD   Stopped at 09/07/23 1312   fluorouracil  (ADRUCIL ) 3,500 mg in sodium chloride  0.9 % 80 mL chemo infusion  2,000 mg/m2 (Treatment Plan Recorded) Intravenous 1 day or 1 dose [name], MD   Infusion Verify at 09/07/23 1428   heparin  lock flush 100 unit/mL  500 Units Intracatheter Once PRN [name], MD       sodium chloride  flush (NS) 0.9 % injection 10 mL  10 mL Intracatheter PRN [name], MD        PHYSICAL EXAMINATION: ECOG PERFORMANCE STATUS: 1 - Symptomatic but completely ambulatory  Vitals:   09/07/23 0825  BP: 136/70  Pulse: 76  Resp: 15  Temp: (!) 97.4 F (36.3 C)  SpO2: 99%   Wt Readings from Last 3 Encounters:  09/07/23 168 lb 12.8 oz (76.6 kg)  08/25/23 169 lb 11.2 oz (77 kg)  08/13/23 170 lb 12.8 oz (77.5 kg)     GENERAL:alert, no distress and comfortable SKIN: skin color, texture, turgor are normal, no rashes or significant lesions EYES: normal,  Conjunctiva are pink and non-injected, sclera clear NECK: supple, thyroid normal size, non-tender, without nodularity LYMPH:  no palpable lymphadenopathy in the cervical, axillary  LUNGS: clear to auscultation and percussion with normal breathing effort HEART: regular rate & rhythm and no murmurs and no lower extremity edema ABDOMEN:abdomen soft, non-tender and normal bowel sounds Musculoskeletal:no cyanosis of digits and no clubbing  NEURO: alert & oriented x 3 with fluent speech, no focal motor/sensory deficits  Physical Exam    LABORATORY DATA:  I have reviewed the data as listed    Latest Ref Rng & Units 09/07/2023    7:57 AM 08/25/2023    9:16 AM 08/11/2023    9:06 AM  CBC  WBC 4.0 - 10.5 K/uL 5.0  6.9  9.1   Hemoglobin 12.0 - 15.0 g/dL 89.0  88.9  89.1   Hematocrit 36.0 - 46.0 % 33.8  33.7  33.7   Platelets 150 - 400 K/uL 269  254  284         Latest Ref Rng & Units 09/07/2023    7:57 AM 08/25/2023    9:16 AM 08/11/2023    9:06 AM  CMP  Glucose 70 - 99 mg/dL 875  885  872   BUN 8 - 23 mg/dL 11  12  10    Creatinine 0.44 - 1.00 mg/dL 9.28  9.28  9.40   Sodium 135 - 145 mmol/L 138  139  138   Potassium 3.5 - 5.1 mmol/L 3.8  4.1  4.1   Chloride 98 - 111 mmol/L 103  104  103   CO2 22 - 32 mmol/L 28  30  29    Calcium  8.9 - 10.3 mg/dL 9.6  9.6  9.5   Total Protein 6.5 - 8.1 g/dL 7.6  7.5  7.7   Total Bilirubin 0.0 - 1.2 mg/dL 0.3  0.3  0.4   Alkaline Phos 38 - 126 U/L 107  122  149   AST 15 - 41 U/L 15  15  25    ALT 0 - 44 U/L 15  14  30        RADIOGRAPHIC STUDIES: I have personally reviewed the radiological images as listed and agreed with the findings in the report. No results found.    Orders Placed This Encounter  Procedures   Magnesium     Standing Status:   Future    Expected Date:   10/06/2023    Expiration Date:   10/05/2024   CBC with Differential (Cancer Center Only)    Standing Status:   Future    Expected Date:   10/20/2023    Expiration Date:   10/19/2024    CMP (Cancer Center only)    Standing Status:   Future    Expected Date:   10/20/2023    Expiration Date:   10/19/2024   Magnesium     Standing Status:   Future    Expected Date:   10/20/2023    Expiration Date:   10/19/2024   All questions were answered. The patient knows to call the clinic with any problems, questions or concerns. No barriers to learning was detected. The total time spent in the appointment was 25 minutes, including review of chart and various tests results, discussions about plan of care and coordination of care plan     Onita Mattock, MD 09/07/2023

## 2023-09-09 ENCOUNTER — Inpatient Hospital Stay: Payer: Medicare (Managed Care)

## 2023-09-09 VITALS — BP 118/68 | HR 57 | Temp 98.3°F | Resp 18

## 2023-09-09 DIAGNOSIS — Z95828 Presence of other vascular implants and grafts: Secondary | ICD-10-CM

## 2023-09-09 DIAGNOSIS — C221 Intrahepatic bile duct carcinoma: Secondary | ICD-10-CM

## 2023-09-09 DIAGNOSIS — Z5112 Encounter for antineoplastic immunotherapy: Secondary | ICD-10-CM | POA: Diagnosis not present

## 2023-09-09 MED ORDER — HEPARIN SOD (PORK) LOCK FLUSH 100 UNIT/ML IV SOLN
250.0000 [IU] | Freq: Once | INTRAVENOUS | Status: AC
  Administered 2023-09-09: 250 [IU]

## 2023-09-09 MED ORDER — SODIUM CHLORIDE 0.9% FLUSH
10.0000 mL | Freq: Once | INTRAVENOUS | Status: AC
  Administered 2023-09-09: 10 mL

## 2023-09-13 ENCOUNTER — Telehealth: Payer: Self-pay | Admitting: Cardiology

## 2023-09-13 DIAGNOSIS — R072 Precordial pain: Secondary | ICD-10-CM

## 2023-09-13 DIAGNOSIS — I25118 Atherosclerotic heart disease of native coronary artery with other forms of angina pectoris: Secondary | ICD-10-CM

## 2023-09-13 MED ORDER — RANOLAZINE ER 500 MG PO TB12
500.0000 mg | ORAL_TABLET | Freq: Two times a day (BID) | ORAL | 3 refills | Status: DC
Start: 2023-09-13 — End: 2023-10-11

## 2023-09-13 NOTE — Telephone Encounter (Signed)
*  STAT* If patient is at the pharmacy, call can be transferred to refill team.   1. Which medications need to be refilled? (please list name of each medication and dose if known)   ranolazine  (RANEXA ) 500 MG 12 hr tablet    2. Which pharmacy/location (including street and city if local pharmacy) is medication to be sent to?  [facility] - [place] ([place]),  - [address] Phone: [phone]  Fax: [phone]      3. Do they need a 30 day or 90 day supply? 90

## 2023-09-13 NOTE — Telephone Encounter (Signed)
 Pt's medication was sent to pt's pharmacy as requested. Confirmation received.

## 2023-09-14 ENCOUNTER — Inpatient Hospital Stay: Payer: Medicare (Managed Care)

## 2023-09-14 DIAGNOSIS — C221 Intrahepatic bile duct carcinoma: Secondary | ICD-10-CM

## 2023-09-14 DIAGNOSIS — Z5112 Encounter for antineoplastic immunotherapy: Secondary | ICD-10-CM | POA: Diagnosis not present

## 2023-09-14 DIAGNOSIS — Z95828 Presence of other vascular implants and grafts: Secondary | ICD-10-CM

## 2023-09-14 MED ORDER — HEPARIN SOD (PORK) LOCK FLUSH 100 UNIT/ML IV SOLN
250.0000 [IU] | Freq: Once | INTRAVENOUS | Status: AC
  Administered 2023-09-14: 250 [IU]

## 2023-09-14 MED ORDER — SODIUM CHLORIDE 0.9% FLUSH
10.0000 mL | Freq: Once | INTRAVENOUS | Status: AC
  Administered 2023-09-14: 10 mL

## 2023-09-21 NOTE — Assessment & Plan Note (Signed)
-  overall mild and stable

## 2023-09-21 NOTE — Assessment & Plan Note (Signed)
 MSS, [name]/[name] wildtype -diagnosed 12/2019 by porta hepatis LN biopsy during EUS for work up of abdominal pain and liver lesions seen on US  and MRI. Liver biopsy 01/08/20 confirmed metastasis from primary colorectal cancer. PET scan showed hypermetabolism to known liver mets, diffuse thoracic and abdominal lymphadenopathy, a 1.8 cm LUL pulmonary nodule, and splenic flexure of colon. -treated with first line FOLFOX 01/25/20 - 10/02/20, Vectibix  added with C2. Oxali discontinued after C18 due to reaction. -switched to Xeloda  10/21/20, dose adjusted due to significant skin toxicity -due to cancer progression, treatment has been changed to FOLFIRI and bevacizumab  on 03/25/22, she is overall tolerating well -will repeat CT scan after next cycle chemo  -Restaging CT scan from 06/16/2022 showed stable to mild decrease in size of treated liver metastasis. No new lesions -She is tolerating chemotherapy well overall, will continue -Her restaging CT scan on August 31, 2022 showed stable liver metastasis, and a stable 7 mm left lung nodule, indeterminate.  I personally reviewed the scan images with patient -Will continue current therapy.  We discussed the option of maintenance therapy with irinotecan  and bevacizumab  in future.  She is tolerating current FOLFIRI and bevacizumab  well, will continue. -Restaging CT 03/24/2023 showed stable disease.  -restaging CT in 06/2023 showed mild disease progression in liver, I change Beva to vectibix 

## 2023-09-22 ENCOUNTER — Encounter: Payer: Self-pay | Admitting: Hematology

## 2023-09-22 ENCOUNTER — Inpatient Hospital Stay (HOSPITAL_BASED_OUTPATIENT_CLINIC_OR_DEPARTMENT_OTHER): Payer: Medicare (Managed Care) | Admitting: Hematology

## 2023-09-22 ENCOUNTER — Inpatient Hospital Stay: Payer: Medicare (Managed Care)

## 2023-09-22 VITALS — BP 138/78 | HR 72 | Temp 97.3°F | Resp 16 | Ht 66.0 in | Wt 171.8 lb

## 2023-09-22 DIAGNOSIS — T451X5D Adverse effect of antineoplastic and immunosuppressive drugs, subsequent encounter: Secondary | ICD-10-CM

## 2023-09-22 DIAGNOSIS — R21 Rash and other nonspecific skin eruption: Secondary | ICD-10-CM

## 2023-09-22 DIAGNOSIS — Z95828 Presence of other vascular implants and grafts: Secondary | ICD-10-CM

## 2023-09-22 DIAGNOSIS — C7800 Secondary malignant neoplasm of unspecified lung: Secondary | ICD-10-CM

## 2023-09-22 DIAGNOSIS — K5900 Constipation, unspecified: Secondary | ICD-10-CM

## 2023-09-22 DIAGNOSIS — C221 Intrahepatic bile duct carcinoma: Secondary | ICD-10-CM

## 2023-09-22 DIAGNOSIS — G620 Drug-induced polyneuropathy: Secondary | ICD-10-CM

## 2023-09-22 DIAGNOSIS — Z5112 Encounter for antineoplastic immunotherapy: Secondary | ICD-10-CM | POA: Diagnosis not present

## 2023-09-22 DIAGNOSIS — C19 Malignant neoplasm of rectosigmoid junction: Secondary | ICD-10-CM

## 2023-09-22 DIAGNOSIS — C787 Secondary malignant neoplasm of liver and intrahepatic bile duct: Secondary | ICD-10-CM | POA: Diagnosis not present

## 2023-09-22 DIAGNOSIS — C189 Malignant neoplasm of colon, unspecified: Secondary | ICD-10-CM

## 2023-09-22 LAB — CBC WITH DIFFERENTIAL (CANCER CENTER ONLY)
Abs Immature Granulocytes: 0.01 K/uL (ref 0.00–0.07)
Basophils Absolute: 0.1 K/uL (ref 0.0–0.1)
Basophils Relative: 1 %
Eosinophils Absolute: 0.1 K/uL (ref 0.0–0.5)
Eosinophils Relative: 2 %
HCT: 33.8 % — ABNORMAL LOW (ref 36.0–46.0)
Hemoglobin: 11 g/dL — ABNORMAL LOW (ref 12.0–15.0)
Immature Granulocytes: 0 %
Lymphocytes Relative: 19 %
Lymphs Abs: 1.1 K/uL (ref 0.7–4.0)
MCH: 28.5 pg (ref 26.0–34.0)
MCHC: 32.5 g/dL (ref 30.0–36.0)
MCV: 87.6 fL (ref 80.0–100.0)
Monocytes Absolute: 0.4 K/uL (ref 0.1–1.0)
Monocytes Relative: 7 %
Neutro Abs: 4.2 K/uL (ref 1.7–7.7)
Neutrophils Relative %: 71 %
Platelet Count: 270 K/uL (ref 150–400)
RBC: 3.86 MIL/uL — ABNORMAL LOW (ref 3.87–5.11)
RDW: 17.7 % — ABNORMAL HIGH (ref 11.5–15.5)
WBC Count: 5.9 K/uL (ref 4.0–10.5)
nRBC: 0 % (ref 0.0–0.2)

## 2023-09-22 LAB — CEA (ACCESS): CEA (CHCC): 42.55 ng/mL — ABNORMAL HIGH (ref 0.00–5.00)

## 2023-09-22 LAB — CMP (CANCER CENTER ONLY)
ALT: 17 U/L (ref 0–44)
AST: 15 U/L (ref 15–41)
Albumin: 3.9 g/dL (ref 3.5–5.0)
Alkaline Phosphatase: 91 U/L (ref 38–126)
Anion gap: 5 (ref 5–15)
BUN: 7 mg/dL — ABNORMAL LOW (ref 8–23)
CO2: 30 mmol/L (ref 22–32)
Calcium: 9.1 mg/dL (ref 8.9–10.3)
Chloride: 106 mmol/L (ref 98–111)
Creatinine: 0.48 mg/dL (ref 0.44–1.00)
GFR, Estimated: >60 mL/min (ref >60)
Glucose, Bld: 117 mg/dL — ABNORMAL HIGH (ref 70–99)
Potassium: 3.3 mmol/L — ABNORMAL LOW (ref 3.5–5.1)
Sodium: 141 mmol/L (ref 135–145)
Total Bilirubin: 0.3 mg/dL (ref 0.0–1.2)
Total Protein: 7.1 g/dL (ref 6.5–8.1)

## 2023-09-22 LAB — MAGNESIUM: Magnesium: 1.7 mg/dL (ref 1.7–2.4)

## 2023-09-22 LAB — TOTAL PROTEIN, URINE DIPSTICK: Protein, ur: NEGATIVE mg/dL

## 2023-09-22 MED ORDER — SODIUM CHLORIDE 0.9% FLUSH
10.0000 mL | Freq: Once | INTRAVENOUS | Status: AC
Start: 2023-09-22 — End: 2023-09-22
  Administered 2023-09-22: 10 mL

## 2023-09-22 MED ORDER — SODIUM CHLORIDE 0.9 % IV SOLN
2000.0000 mg/m2 | INTRAVENOUS | Status: DC
  Administered 2023-09-22: 3500 mg via INTRAVENOUS
  Filled 2023-09-22: qty 70

## 2023-09-22 MED ORDER — PALONOSETRON HCL INJECTION 0.25 MG/5ML
0.2500 mg | Freq: Once | INTRAVENOUS | Status: AC
  Administered 2023-09-22: 0.25 mg via INTRAVENOUS
  Filled 2023-09-22: qty 5

## 2023-09-22 MED ORDER — SODIUM CHLORIDE 0.9% FLUSH: 10.0000 mL | INTRAVENOUS | Status: DC | PRN

## 2023-09-22 MED ORDER — SODIUM CHLORIDE 0.9 % IV SOLN
150.0000 mg/m2 | Freq: Once | INTRAVENOUS | Status: AC
  Administered 2023-09-22: 300 mg via INTRAVENOUS
  Filled 2023-09-22: qty 15

## 2023-09-22 MED ORDER — HEPARIN SOD (PORK) LOCK FLUSH 100 UNIT/ML IV SOLN
250.0000 [IU] | Freq: Once | INTRAVENOUS | Status: DC | PRN
Start: 2023-09-22 — End: 2023-09-22

## 2023-09-22 MED ORDER — SODIUM CHLORIDE 0.9 % IV SOLN: INTRAVENOUS | Status: DC

## 2023-09-22 MED ORDER — SODIUM CHLORIDE 0.9 % IV SOLN
6.0000 mg/kg | Freq: Once | INTRAVENOUS | Status: AC
  Administered 2023-09-22: 500 mg via INTRAVENOUS
  Filled 2023-09-22: qty 5

## 2023-09-22 MED ORDER — SODIUM CHLORIDE 0.9 % IV SOLN
400.0000 mg/m2 | Freq: Once | INTRAVENOUS | Status: AC
  Administered 2023-09-22: 768 mg via INTRAVENOUS
  Filled 2023-09-22: qty 25

## 2023-09-22 MED ORDER — DEXAMETHASONE SODIUM PHOSPHATE 10 MG/ML IJ SOLN
10.0000 mg | Freq: Once | INTRAMUSCULAR | Status: AC
  Administered 2023-09-22: 10 mg via INTRAVENOUS
  Filled 2023-09-22: qty 1

## 2023-09-22 MED ORDER — FAMOTIDINE IN NACL 20-0.9 MG/50ML-% IV SOLN
20.0000 mg | Freq: Once | INTRAVENOUS | Status: AC
  Administered 2023-09-22: 20 mg via INTRAVENOUS
  Filled 2023-09-22: qty 50

## 2023-09-22 NOTE — Patient Instructions (Signed)
 [facility] - A DEPT OF [facility]  Discharge Instructions: Thank you for choosing [facility] to provide your oncology and hematology care.   If you have a lab appointment with the Cancer Center, please go directly to the Cancer Center and check in at the registration area.   Wear comfortable clothing and clothing appropriate for easy access to any Portacath or PICC line.   We strive to give you quality time with your provider. You may need to reschedule your appointment if you arrive late (15 or more minutes).  Arriving late affects you and other patients whose appointments are after yours.  Also, if you miss three or more appointments without notifying the office, you may be dismissed from the clinic at the provider's discretion.      For prescription refill requests, have your pharmacy contact our office and allow 72 hours for refills to be completed.    Today you received the following chemotherapy and/or immunotherapy agents: Panitumumab  (Vectibix ), Irinotecan /Leucovorin /5FU pump      To help prevent nausea and vomiting after your treatment, we encourage you to take your nausea medication as directed.  BELOW ARE SYMPTOMS THAT SHOULD BE REPORTED IMMEDIATELY: *FEVER GREATER THAN 100.4 F (38 C) OR HIGHER *CHILLS OR SWEATING *NAUSEA AND VOMITING THAT IS NOT CONTROLLED WITH YOUR NAUSEA MEDICATION *UNUSUAL SHORTNESS OF BREATH *UNUSUAL BRUISING OR BLEEDING *URINARY PROBLEMS (pain or burning when urinating, or frequent urination) *BOWEL PROBLEMS (unusual diarrhea, constipation, pain near the anus) TENDERNESS IN MOUTH AND THROAT WITH OR WITHOUT PRESENCE OF ULCERS (sore throat, sores in mouth, or a toothache) UNUSUAL RASH, SWELLING OR PAIN  UNUSUAL VAGINAL DISCHARGE OR ITCHING   Items with * indicate a potential emergency and should be followed up as soon as possible or go to the Emergency Department if any problems should occur.  Please show  the CHEMOTHERAPY ALERT CARD or IMMUNOTHERAPY ALERT CARD at check-in to the Emergency Department and triage nurse.  Should you have questions after your visit or need to cancel or reschedule your appointment, please contact [facility] - A DEPT OF [facility]  Dept: [phone]  and follow the prompts.  Office hours are 8:00 a.m. to 4:30 p.m. Monday - Friday. Please note that voicemails left after 4:00 p.m. may not be returned until the following business day.  We are closed weekends and major holidays. You have access to a nurse at all times for urgent questions. Please call the main number to the clinic Dept: [phone] and follow the prompts.   For any non-urgent questions, you may also contact your provider using MyChart. We now offer e-Visits for anyone 62 and older to request care online for non-urgent symptoms. For details visit [email or web address].   Also download the MyChart app! Go to the app store, search "MyChart", open the app, select [place], and log in with your MyChart username and password.  Panitumumab  Injection What is this medication? PANITUMUMAB  (pan i TOOM ue mab) treats colorectal cancer. It works by blocking a protein that causes cancer cells to grow and multiply. This helps to slow or stop the spread of cancer cells. It is a monoclonal antibody. This medicine may be used for other purposes; ask your health care provider or pharmacist if you have questions. COMMON BRAND NAME(S): Vectibix  What should I tell my care team before I take this medication? They need to know if you have any of  these conditions: Eye disease Low levels of magnesium  in the blood Lung disease An unusual or allergic reaction to panitumumab , other medications, foods, dyes, or preservatives Pregnant or trying to get pregnant Breast-feeding How should I use this medication? This medication is injected into a vein. It is given by your care team in a hospital or  clinic setting. Talk to your care team about the use of this medication in children. Special care may be needed. Overdosage: If you think you have taken too much of this medicine contact a poison control center or emergency room at once. NOTE: This medicine is only for you. Do not share this medicine with others. What if I miss a dose? Keep appointments for follow-up doses. It is important not to miss your dose. Call your care team if you are unable to keep an appointment. What may interact with this medication? Bevacizumab  This list may not describe all possible interactions. Give your health care provider a list of all the medicines, herbs, non-prescription drugs, or dietary supplements you use. Also tell them if you smoke, drink alcohol, or use illegal drugs. Some items may interact with your medicine. What should I watch for while using this medication? Your condition will be monitored carefully while you are receiving this medication. This medication may make you feel generally unwell. This is not uncommon as chemotherapy can affect healthy cells as well as cancer cells. Report any side effects. Continue your course of treatment even though you feel ill unless your care team tells you to stop. This medication can make you more sensitive to the sun. Keep out of the sun while receiving this medication and for 2 months after stopping therapy. If you cannot avoid being in the sun, wear protective clothing and sunscreen. Do not use sun lamps, tanning beds, or tanning booths. Check with your care team if you have severe diarrhea, nausea, and vomiting or if you sweat a lot. The loss of too much body fluid may make it dangerous for you to take this medication. This medication may cause serious skin reactions. They can happen weeks to months after starting the medication. Contact your care team right away if you notice fevers or flu-like symptoms with a rash. The rash may be red or purple and then turn  into blisters or peeling of the skin. You may also notice a red rash with swelling of the face, lips, or lymph nodes in your neck or under your arms. Talk to your care team if you may be pregnant. Serious birth defects can occur if you take this medication during pregnancy and for 2 months after the last dose. Contraception is recommended while taking this medication and for 2 months after the last dose. Your care team can help you find the option that works for you. Do not breastfeed while taking this medication and for 2 months after the last dose. This medication may cause infertility. Talk to your care team if you are concerned about your fertility. What side effects may I notice from receiving this medication? Side effects that you should report to your care team as soon as possible: Allergic reactions--skin rash, itching, hives, swelling of the face, lips, tongue, or throat Dry cough, shortness of breath or trouble breathing Eye pain, redness, irritation, or discharge with blurry or decreased vision Infusion reactions--chest pain, shortness of breath or trouble breathing, feeling faint or lightheaded Low magnesium  level--muscle pain or cramps, unusual weakness or fatigue, fast or irregular heartbeat, tremors Low potassium level--muscle  pain or cramps, unusual weakness or fatigue, fast or irregular heartbeat, constipation Redness, blistering, peeling, or loosening of the skin, including inside the mouth Skin reactions on sun-exposed areas Side effects that usually do not require medical attention (report to your care team if they continue or are bothersome): Change in nail shape, thickness, or color Diarrhea Dry skin Fatigue Nausea Vomiting This list may not describe all possible side effects. Call your doctor for medical advice about side effects. You may report side effects to FDA at 1-800-FDA-1088. Where should I keep my medication? This medication is given in a hospital or clinic. It  will not be stored at home. NOTE: This sheet is a summary. It may not cover all possible information. If you have questions about this medicine, talk to your doctor, pharmacist, or health care provider.  2024 Elsevier/Gold Standard (2021-07-09 00:00:00)

## 2023-09-22 NOTE — Progress Notes (Signed)
 [name]   [phone] [phone]   Clinic Follow up Note   Patient Care Team: Lindsay Carwin, MD as PCP - General (Internal Medicine) Lindsay Richardson, DO as PCP - Cardiology (Cardiology) Lindsay Callander, MD as Consulting Physician (Oncology) Lindsay Dover, MD as Consulting Physician (Gastroenterology)  Date of Service:  09/22/2023  CHIEF COMPLAINT: f/u of colon cancer  CURRENT THERAPY:  FOLFIRI and Vectibix   Oncology History   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 Liver biopsy 01/08/20 confirmed metastasis from primary colorectal cancer. PET scan showed hypermetabolism to known liver mets, diffuse thoracic and abdominal lymphadenopathy, a 1.8 cm LUL pulmonary nodule, and splenic flexure of colon. -treated with first line FOLFOX 01/25/20 - 10/02/20, Vectibix  added with C2. Oxali discontinued after C18 due to reaction. -switched to Xeloda  10/21/20, dose adjusted due to significant skin toxicity -due to cancer progression, treatment has been changed to FOLFIRI and bevacizumab  on 03/25/22, she is overall tolerating well -will repeat CT scan after next cycle chemo  -Restaging CT scan from 06/16/2022 showed stable to mild decrease in size of treated liver metastasis. No new lesions -She is tolerating chemotherapy well overall, will continue -Her restaging CT scan on August 31, 2022 showed stable liver metastasis, and a stable 7 mm left lung nodule, indeterminate.  I personally reviewed the scan images with patient -Will continue current therapy.  We discussed the option of maintenance therapy with irinotecan  and bevacizumab  in future.  She is tolerating current FOLFIRI and bevacizumab  well, will continue. -Restaging CT 03/24/2023 showed stable disease.   -restaging CT in 06/2023 showed mild disease progression in liver, I change Beva to vectibix    Assessment & Plan Metastatic colon cancer Currently undergoing treatment with FOLFIRI and Vectibix , cycle four administered today. No reported diarrhea, but experiencing constipation. Awaiting lab results to monitor blood counts. Next scan scheduled for September 27, 2023, to assess disease progression. - Administer cycle four of FOLFIRI and Vectibix  - Monitor lab results for blood counts - Perform scan on September 27, 2023, to assess disease progression - Continue current treatment regimen if no disease progression is observed  Skin rash due to Vectibix  Experiencing skin dryness and itching, likely due to Vectibix . Symptoms are mild and not significantly bothersome.  Constipation Reports constipation, currently using Metamucil. Miralax  was ineffective at previous doses. Recent bowel movement occurred this morning. - Increase Metamucil intake - Consider higher doses of Miralax  if needed  Plan - She is tolerating chemo treatment well, will continue - Lab reviewed, adequate for treatment, will proceed to cycle 4 FOLFIRI and Vectibix  - Follow-up in 2 weeks before next cycle chemo. - Restaging CT scan scheduled for July 21   SUMMARY OF ONCOLOGIC HISTORY: Oncology History  metastatic colon cancer to liver  06/20/2019 Procedure   Colonoscopy by Dr Wilhelmenia 06/20/19  IMPRESSION -Seven 3 to 10 mm polyps in the sigmoid colon, in the transverse colon and in the escending colon, removed with a cold snare. Resected and retrireved.  -One 20mm polyp in the descending colon. Biopsies. Tattoes.  -Mediaum sized lipoma in the ascending colon.   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 See Comment B. Colon, Ascending, polyp, Polypectomy:  -TUBULAR ADENOMA -No high grade dysplasia or  malignancy.  C. Colon, TRansverse, Polyo,  polectomy:  -TUBULAR ADENOMA -No high grade dysplasia or malignancy.  D. Colon, Sigmoid, Polyp, Polypectomy:  -HYPERPLASTIC POLYP   11/07/2019 Imaging   US  Abdomen 11/07/19  IMPRESSION: 1. Two solid masses in the liver are nonspecific. Recommend MRI abdomen with and without contrast for further evaluation.   12/15/2019 Imaging   MRI Abdomen 12/15/19  IMPRESSION: 1. There are two large masses in the liver with appearance favoring metastatic disease or hepatocellular carcinoma or cholangiocarcinoma. A benign etiology is highly unlikely given the enhancement pattern and associated adenopathy. 2. Considerable porta hepatis and retroperitoneal adenopathy. Some of the confluent porta hepatis tumor is potentially infiltrative and abuts the pancreatic body along its right upper margin, making it difficult to completely exclude the possibility of pancreatic adenocarcinoma primary. Possibilities helpful in further workup might include tissue diagnosis, endoscopic ultrasound, or nuclear medicine PET-CT. 3. Pancreas divisum. 4. Lumbar spondylosis and degenerative disc disease. 5. Despite efforts by the technologist and patient, motion artifact is present on today's exam and could not be eliminated. This reduces exam sensitivity and specificity.   12/22/2019 Procedure   Upper Endoscopy by Dr Lindsay 12/22/19  IMPRESSION - One lymph node was visualized and measured in the porta hepatis region. Fine needle aspiration performed    12/22/2019 Initial Biopsy   A. LIVER, PORTA HEPATIS MASS, FINE NEEDLE 12/22/19 ASPIRATION:  FINAL MICROSCOPIC DIAGNOSIS:  - Malignant cells consistent with metastatic adenocarcinoma   01/01/2020 Initial Diagnosis   Intrahepatic cholangiocarcinoma (HCC)   01/08/2020 Initial Biopsy   FINAL MICROSCOPIC DIAGNOSIS:   A. LIVER, LEFT LOBE, BIOPSY:  - Metastatic adenocarcinoma, consistent with a colorectal primary.  See  comment      COMMENT:    Immunohistochemical stains show the tumor cells are positive for CK20  and CDX2 but negative for CK7, consistent with above interpretation.  Dr. 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   01/24/2020 Procedure   PAC placed 01/24/20   01/25/2020 -  Chemotherapy   first line FOLFOX starting 01/25/2020, Vextibix  added with C2 (02/06/20)   02/06/2020 - 02/06/2022 Chemotherapy   Patient is on Treatment Plan : COLORECTAL Panitumumab  q14d ([name] - Type Gene Only)     06/20/2020 Imaging   CT CAP  IMPRESSION: 1. Continued interval reduction in size and conspicuity of a subsolid nodule of the peripheral left upper lobe. 2. Unchanged prominent pretracheal and subcarinal lymph nodes. 3. Redemonstrated partially calcified low-attenuation liver masses, slightly decreased in size compared to prior examination. 4. Slight interval decrease in size of a portacaval lymph node or conglomerate and retroperitoneal lymph nodes. 5. Findings are consistent with continued treatment response of nodal, pulmonary, and hepatic metastatic disease. 6. Coronary artery disease.   Aortic Atherosclerosis (ICD10-I70.0).     10/09/2020 Imaging   IMPRESSION: 1. Slight decrease in size of dominant liver mass with smaller liver mass within 1-2 mm of prior measurement. 2. Stable appearance of celiac lymph and retroperitoneal lymph nodes. Dominant node with calcification in the gastrohepatic ligament as described. 3. Continued decrease in size of LEFT upper lobe nodule. 4. Three-vessel coronary artery calcification. 5. Aortic atherosclerosis.   03/25/2022 - 07/16/2023 Chemotherapy   Patient is on Treatment Plan : COLORECTAL FOLFIRI + Bevacizumab  q14d     08/31/2022 Imaging    IMPRESSION: 1. Unchanged, densely calcified liver lesions, consistent with treated metastatic  disease. 2. Unchanged, irregular subpleural opacity of the peripheral left upper lobe. 3. Unchanged subcentimeter gastrohepatic ligament lymph node. 4. No evidence of new metastatic disease in the chest, abdomen, or pelvis. 5. Status post hysterectomy. 6. Coronary artery disease. 7. Aortic valve calcifications. Correlate for echocardiographic evidence of aortic valve dysfunction.     06/22/2023 Imaging   CT Chest, Abdomen, and pelvis with contrast  IMPRESSION: Centrally calcified liver lesions are again seen and slightly increased today. Prominent nodes in the upper retroperitoneum are overall similar. Attention on follow-up.   Stable branching left upper lobe lung nodule.   08/11/2023 -  Chemotherapy   Patient is on Treatment Plan : COLORECTAL FOLFIRI + Panitumumab  q14d        Discussed the use of AI scribe software for clinical note transcription with the patient, who gave verbal consent to proceed.  History of Present Illness Lindsay [name] is a 68 year old female with metastatic colon cancer who presents for follow-up.  She is undergoing her fourth cycle of Vectibix . Lab results from today's blood draw are pending. Her next imaging scan is scheduled for September 27, 2023.  She experiences skin dryness and pruritus, described as a sensation of 'pulling of the skin,' but it is not significantly bothersome.  She experiences constipation and plans to increase her Metamucil intake. Miralax  has been ineffective, even at higher doses. She had a bowel movement this morning. No diarrhea.  She has sufficient nausea medication and does not require refills.     All other systems were reviewed with the patient and are negative.  MEDICAL HISTORY:  Past Medical History:  Diagnosis Date   Arthritis    Diabetes mellitus without complication (HCC)    Hypertension    met colon ca to liver 01/2020    SURGICAL HISTORY: Past Surgical History:  Procedure Laterality Date   ABDOMINAL  HYSTERECTOMY     ANTERIOR AND POSTERIOR REPAIR WITH SACROSPINOUS FIXATION N/A 07/16/2016   Procedure: ANTERIOR AND POSTERIOR REPAIR WITH SACROSPINOUS FIXATION;  Surgeon: Curlene Agent, MD;  Location: [facility];  Service: Gynecology;  Laterality: N/A;   CYSTOSCOPY  07/16/2016   Procedure: CYSTOSCOPY;  Surgeon: Curlene Agent, MD;  Location: [facility];  Service: Gynecology;;   ESOPHAGOGASTRODUODENOSCOPY (EGD) WITH PROPOFOL  N/A 12/22/2019   Procedure: ESOPHAGOGASTRODUODENOSCOPY (EGD) WITH PROPOFOL ;  Surgeon: Lindsay Dover, MD;  Location: [facility];  Service: Endoscopy;  Laterality: N/A;   FINE NEEDLE ASPIRATION N/A 12/22/2019   Procedure: FINE NEEDLE ASPIRATION (FNA) LINEAR;  Surgeon: Lindsay Dover, MD;  Location: [facility];  Service: Endoscopy;  Laterality: N/A;   IR IMAGING GUIDED PORT INSERTION  01/24/2020   IR REMOVAL TUN ACCESS W/ PORT W/O FL MOD SED  07/30/2023   LAPAROSCOPIC VAGINAL HYSTERECTOMY WITH SALPINGECTOMY Bilateral 07/16/2016   Procedure: LAPAROSCOPIC ASSISTED VAGINAL HYSTERECTOMY WITH SALPINGECTOMY;  Surgeon: Curlene Agent, MD;  Location: [facility];  Service: Gynecology;  Laterality: Bilateral;   LEFT HEART CATH AND CORONARY ANGIOGRAPHY N/A 05/07/2023   Procedure: LEFT HEART CATH AND CORONARY ANGIOGRAPHY;  Surgeon: Anner Alm ORN, MD;  Location: [facility];  Service: Cardiovascular;  Laterality: N/A;   MYOMECTOMY ABDOMINAL APPROACH     THYROID SURGERY     tyroid     UPPER ESOPHAGEAL ENDOSCOPIC ULTRASOUND (EUS) N/A 12/22/2019   Procedure: UPPER ESOPHAGEAL ENDOSCOPIC ULTRASOUND (EUS);  Surgeon: Lindsay Dover, MD;  Location: [facility];  Service: Endoscopy;  Laterality: N/A;    I have reviewed the social history and family history with the patient  and they are unchanged from previous note.  ALLERGIES:  is allergic to oxaliplatin .  MEDICATIONS:  Current Outpatient Medications  Medication Sig Dispense Refill   aspirin  EC 81 MG tablet Take 1 tablet (81 mg total) by mouth  daily. Swallow whole. 90 tablet 0   atorvastatin  (LIPITOR) 80 MG tablet Take 1 tablet (80 mg total) by mouth daily. 90 tablet 0   clindamycin  (CLEOCIN -T) 1 % lotion Apply topically as needed. 60 mL 0   clopidogrel  (PLAVIX ) 75 MG tablet Take 1 tablet (75 mg total) by mouth daily. 90 tablet 0   doxycycline  (VIBRA -TABS) 100 MG tablet Take 1 tablet (100 mg total) by mouth 2 (two) times daily. 60 tablet 3   metoprolol  succinate (TOPROL  XL) 25 MG 24 hr tablet Take 1 tablet (25 mg total) by mouth daily. Hold if systolic BP (top number) is less than 100 and/or heart rate is less than 55 90 tablet 3   nitroGLYCERIN  (NITROSTAT ) 0.4 MG SL tablet Place 1 tablet (0.4 mg total) under the tongue every 5 (five) minutes as needed for chest pain. 25 tablet 11   ranolazine  (RANEXA ) 500 MG 12 hr tablet Take 1 tablet (500 mg total) by mouth 2 (two) times daily. 180 tablet 3   No current facility-administered medications for this visit.   Facility-Administered Medications Ordered in Other Visits  Medication Dose Route Frequency Provider Last Rate Last Admin   0.9 %  sodium chloride  infusion   Intravenous Continuous Lindsay Callander, MD   Stopped at 09/22/23 1359   fluorouracil  (ADRUCIL ) 3,500 mg in sodium chloride  0.9 % 80 mL chemo infusion  2,000 mg/m2 (Treatment Plan Recorded) Intravenous 1 day or 1 dose Lindsay Callander, MD   Infusion Verify at 09/22/23 1504   heparin  lock flush 100 unit/mL  250 Units Intracatheter Once PRN Lindsay Callander, MD       sodium chloride  flush (NS) 0.9 % injection 10 mL  10 mL Intracatheter PRN Lindsay Callander, MD        PHYSICAL EXAMINATION: ECOG PERFORMANCE STATUS: 1 - Symptomatic but completely ambulatory  Vitals:   09/22/23 0957  BP: 138/78  Pulse: 72  Resp: 16  Temp: (!) 97.3 F (36.3 C)  SpO2: 99%   Wt Readings from Last 3 Encounters:  09/22/23 171 lb 12.8 oz (77.9 kg)  09/07/23 168 lb 12.8 oz (76.6 kg)  08/25/23 169 lb 11.2 oz (77 kg)     GENERAL:alert, no distress and comfortable SKIN:  skin color, texture, turgor are normal, no rashes or significant lesions except skin dullness on face, neck, and hands. EYES: normal, Conjunctiva are pink and non-injected, sclera clear NECK: supple, thyroid normal size, non-tender, without nodularity LYMPH:  no palpable lymphadenopathy in the cervical, axillary  LUNGS: clear to auscultation and percussion with normal breathing effort HEART: regular rate & rhythm and no murmurs and no lower extremity edema ABDOMEN:abdomen soft, non-tender and normal bowel sounds Musculoskeletal:no cyanosis of digits and no clubbing  NEURO: alert & oriented x 3 with fluent speech, no focal motor/sensory deficits  Physical Exam    LABORATORY DATA:  I have reviewed the data as listed    Latest Ref Rng & Units 09/22/2023    9:10 AM 09/07/2023    7:57 AM 08/25/2023    9:16 AM  CBC  WBC 4.0 - 10.5 K/uL 5.9  5.0  6.9   Hemoglobin 12.0 - 15.0 g/dL 88.9  89.0  88.9   Hematocrit 36.0 - 46.0 % 33.8  33.8  33.7  Platelets 150 - 400 K/uL 270  269  254         Latest Ref Rng & Units 09/22/2023    9:10 AM 09/07/2023    7:57 AM 08/25/2023    9:16 AM  CMP  Glucose 70 - 99 mg/dL 882  875  885   BUN 8 - 23 mg/dL 7  11  12    Creatinine 0.44 - 1.00 mg/dL 9.51  9.28  9.28   Sodium 135 - 145 mmol/L 141  138  139   Potassium 3.5 - 5.1 mmol/L 3.3  3.8  4.1   Chloride 98 - 111 mmol/L 106  103  104   CO2 22 - 32 mmol/L 30  28  30    Calcium  8.9 - 10.3 mg/dL 9.1  9.6  9.6   Total Protein 6.5 - 8.1 g/dL 7.1  7.6  7.5   Total Bilirubin 0.0 - 1.2 mg/dL 0.3  0.3  0.3   Alkaline Phos 38 - 126 U/L 91  107  122   AST 15 - 41 U/L 15  15  15    ALT 0 - 44 U/L 17  15  14        RADIOGRAPHIC STUDIES: I have personally reviewed the radiological images as listed and agreed with the findings in the report. No results found.    Orders Placed This Encounter  Procedures   CBC with Differential (Cancer Center Only)    Standing Status:   Future    Expected Date:   11/03/2023     Expiration Date:   11/02/2024   CMP (Cancer Center only)    Standing Status:   Future    Expected Date:   11/03/2023    Expiration Date:   11/02/2024   Magnesium     Standing Status:   Future    Expected Date:   11/03/2023    Expiration Date:   11/02/2024   CBC with Differential (Cancer Center Only)    Standing Status:   Future    Expected Date:   11/17/2023    Expiration Date:   11/16/2024   CMP (Cancer Center only)    Standing Status:   Future    Expected Date:   11/17/2023    Expiration Date:   11/16/2024   Magnesium     Standing Status:   Future    Expected Date:   11/17/2023    Expiration Date:   11/16/2024   All questions were answered. The patient knows to call the clinic with any problems, questions or concerns. No barriers to learning was detected. The total time spent in the appointment was 25 minutes, including review of chart and various tests results, discussions about plan of care and coordination of care plan     Onita Mattock, MD 09/22/2023

## 2023-09-23 ENCOUNTER — Ambulatory Visit (HOSPITAL_COMMUNITY)
Admission: RE | Admit: 2023-09-23 | Discharge: 2023-09-23 | Disposition: A | Discharge status: Home / Self Care | Payer: Medicare (Managed Care) | Source: Ambulatory Visit | Attending: Cardiology | Admitting: Cardiology

## 2023-09-23 ENCOUNTER — Ambulatory Visit (HOSPITAL_BASED_OUTPATIENT_CLINIC_OR_DEPARTMENT_OTHER)
Admission: RE | Admit: 2023-09-23 | Discharge: 2023-09-23 | Disposition: A | Discharge status: Home / Self Care | Payer: Medicare (Managed Care) | Source: Ambulatory Visit | Attending: Cardiology | Admitting: Cardiology

## 2023-09-23 DIAGNOSIS — R0609 Other forms of dyspnea: Secondary | ICD-10-CM | POA: Diagnosis not present

## 2023-09-23 DIAGNOSIS — R0989 Other specified symptoms and signs involving the circulatory and respiratory systems: Secondary | ICD-10-CM | POA: Insufficient documentation

## 2023-09-23 DIAGNOSIS — I350 Nonrheumatic aortic (valve) stenosis: Secondary | ICD-10-CM | POA: Diagnosis present

## 2023-09-23 LAB — ECHOCARDIOGRAM COMPLETE
AR max vel: 0.88 cm2
AV Area VTI: 0.8 cm2
AV Area mean vel: 0.8 cm2
AV Mean grad: 21 mmHg
AV Peak grad: 36 mmHg
Ao pk vel: 3 m/s
Area-P 1/2: 3.53 cm2
S' Lateral: 3.18 cm

## 2023-09-24 ENCOUNTER — Encounter: Payer: Self-pay | Admitting: Hematology

## 2023-09-24 ENCOUNTER — Inpatient Hospital Stay: Payer: Medicare (Managed Care)

## 2023-09-24 VITALS — BP 127/74 | HR 68 | Temp 98.7°F | Resp 17

## 2023-09-24 DIAGNOSIS — C221 Intrahepatic bile duct carcinoma: Secondary | ICD-10-CM

## 2023-09-24 DIAGNOSIS — Z5112 Encounter for antineoplastic immunotherapy: Secondary | ICD-10-CM | POA: Diagnosis not present

## 2023-09-24 DIAGNOSIS — Z95828 Presence of other vascular implants and grafts: Secondary | ICD-10-CM

## 2023-09-24 MED ORDER — HEPARIN SOD (PORK) LOCK FLUSH 100 UNIT/ML IV SOLN
500.0000 [IU] | Freq: Once | INTRAVENOUS | Status: AC
  Administered 2023-09-24: 500 [IU]

## 2023-09-24 MED ORDER — SODIUM CHLORIDE 0.9% FLUSH
10.0000 mL | Freq: Once | INTRAVENOUS | Status: AC
Start: 2023-09-24 — End: 2023-09-24
  Administered 2023-09-24: 10 mL

## 2023-09-27 ENCOUNTER — Inpatient Hospital Stay: Payer: Medicare (Managed Care)

## 2023-09-27 ENCOUNTER — Ambulatory Visit (HOSPITAL_COMMUNITY)
Admission: RE | Admit: 2023-09-27 | Discharge: 2023-09-27 | Disposition: A | Discharge status: Home / Self Care | Payer: Medicare (Managed Care) | Source: Ambulatory Visit | Attending: Nurse Practitioner | Admitting: Nurse Practitioner

## 2023-09-27 DIAGNOSIS — C189 Malignant neoplasm of colon, unspecified: Secondary | ICD-10-CM | POA: Diagnosis present

## 2023-09-27 DIAGNOSIS — Z5112 Encounter for antineoplastic immunotherapy: Secondary | ICD-10-CM | POA: Diagnosis not present

## 2023-09-27 DIAGNOSIS — C221 Intrahepatic bile duct carcinoma: Secondary | ICD-10-CM

## 2023-09-27 DIAGNOSIS — Z95828 Presence of other vascular implants and grafts: Secondary | ICD-10-CM

## 2023-09-27 MED ORDER — IOHEXOL 300 MG/ML  SOLN
100.0000 mL | Freq: Once | INTRAMUSCULAR | Status: AC | PRN
  Administered 2023-09-27: 100 mL via INTRAVENOUS

## 2023-09-27 MED ORDER — SODIUM CHLORIDE 0.9% FLUSH
10.0000 mL | Freq: Once | INTRAVENOUS | Status: AC
Start: 2023-09-27 — End: 2023-09-27
  Administered 2023-09-27: 10 mL

## 2023-09-27 MED ORDER — HEPARIN SOD (PORK) LOCK FLUSH 100 UNIT/ML IV SOLN
250.0000 [IU] | Freq: Once | INTRAVENOUS | Status: AC
Start: 2023-09-27 — End: 2023-09-27
  Administered 2023-09-27: 250 [IU]

## 2023-09-27 MED ORDER — IOHEXOL 9 MG/ML PO SOLN
500.0000 mL | ORAL | Status: AC
  Administered 2023-09-27 (×2): 500 mL via ORAL

## 2023-09-27 MED ORDER — HEPARIN SOD (PORK) LOCK FLUSH 100 UNIT/ML IV SOLN
500.0000 [IU] | Freq: Once | INTRAVENOUS | Status: AC
  Administered 2023-09-27: 500 [IU] via INTRAVENOUS

## 2023-09-27 MED ORDER — HEPARIN SOD (PORK) LOCK FLUSH 100 UNIT/ML IV SOLN
INTRAVENOUS | Status: AC
Start: 2023-09-27 — End: 2023-09-27
  Filled 2023-09-27: qty 5

## 2023-09-28 ENCOUNTER — Other Ambulatory Visit: Payer: Self-pay

## 2023-10-01 ENCOUNTER — Ambulatory Visit: Payer: Self-pay | Admitting: Nurse Practitioner

## 2023-10-01 NOTE — Telephone Encounter (Addendum)
 Contacted patient via telephone call,per Lacie Burton,NP.  Patient is aware of provider's comments from below.  ----- Message from Lacie K Burton sent at 10/01/2023  1:39 PM EDT ----- CT scan is stable, good news. Dr. Lanny will review at f/up on 7/30.  Thanks Lacie NP ----- Message ----- From: Interface, Rad Results In Sent: 09/29/2023   9:09 AM EDT To: Lacie K Burton, NP

## 2023-10-03 ENCOUNTER — Ambulatory Visit: Payer: Self-pay | Admitting: Cardiology

## 2023-10-03 DIAGNOSIS — I350 Nonrheumatic aortic (valve) stenosis: Secondary | ICD-10-CM

## 2023-10-05 NOTE — Assessment & Plan Note (Addendum)
 MSS, [name]/[name] wildtype -diagnosed 12/2019 by porta hepatis LN biopsy during EUS for work up of abdominal pain and liver lesions seen on US  and MRI. Liver biopsy 01/08/20 confirmed metastasis from primary colorectal cancer. PET scan showed hypermetabolism to known liver mets, diffuse thoracic and abdominal lymphadenopathy, a 1.8 cm LUL pulmonary nodule, and splenic flexure of colon. -treated with first line FOLFOX 01/25/20 - 10/02/20, Vectibix  added with C2. Oxali discontinued after C18 due to reaction. -switched to Xeloda  10/21/20, dose adjusted due to significant skin toxicity -due to cancer progression, treatment has been changed to FOLFIRI and bevacizumab  on 03/25/22, she is overall tolerating well -will repeat CT scan after next cycle chemo  -Restaging CT scan from 06/16/2022 showed stable to mild decrease in size of treated liver metastasis. No new lesions -She is tolerating chemotherapy well overall, will continue -Her restaging CT scan on August 31, 2022 showed stable liver metastasis, and a stable 7 mm left lung nodule, indeterminate.  I personally reviewed the scan images with patient -Will continue current therapy.  We discussed the option of maintenance therapy with irinotecan  and bevacizumab  in future.  She is tolerating current FOLFIRI and bevacizumab  well, will continue. -Restaging CT 03/24/2023 showed stable disease.  -restaging CT in 06/2023 showed mild disease progression in liver, I changed Beva to vectibix   - Restaging CT scan on September 27, 2023 showed stable disease, will continue current treatment.

## 2023-10-05 NOTE — Assessment & Plan Note (Signed)
-  overall mild and stable

## 2023-10-06 ENCOUNTER — Inpatient Hospital Stay (HOSPITAL_BASED_OUTPATIENT_CLINIC_OR_DEPARTMENT_OTHER): Payer: Medicare (Managed Care) | Admitting: Hematology

## 2023-10-06 ENCOUNTER — Inpatient Hospital Stay: Payer: Medicare (Managed Care)

## 2023-10-06 VITALS — BP 120/68 | HR 55 | Temp 97.3°F | Resp 17 | Ht 66.0 in | Wt 171.3 lb

## 2023-10-06 DIAGNOSIS — C221 Intrahepatic bile duct carcinoma: Secondary | ICD-10-CM

## 2023-10-06 DIAGNOSIS — Z5112 Encounter for antineoplastic immunotherapy: Secondary | ICD-10-CM | POA: Diagnosis not present

## 2023-10-06 DIAGNOSIS — C189 Malignant neoplasm of colon, unspecified: Secondary | ICD-10-CM

## 2023-10-06 DIAGNOSIS — G620 Drug-induced polyneuropathy: Secondary | ICD-10-CM | POA: Diagnosis not present

## 2023-10-06 DIAGNOSIS — Z95828 Presence of other vascular implants and grafts: Secondary | ICD-10-CM

## 2023-10-06 DIAGNOSIS — T451X5A Adverse effect of antineoplastic and immunosuppressive drugs, initial encounter: Secondary | ICD-10-CM

## 2023-10-06 LAB — CBC WITH DIFFERENTIAL (CANCER CENTER ONLY)
Abs Immature Granulocytes: 0.01 K/uL (ref 0.00–0.07)
Basophils Absolute: 0 K/uL (ref 0.0–0.1)
Basophils Relative: 1 %
Eosinophils Absolute: 0.3 K/uL (ref 0.0–0.5)
Eosinophils Relative: 6 %
HCT: 35.6 % — ABNORMAL LOW (ref 36.0–46.0)
Hemoglobin: 11.5 g/dL — ABNORMAL LOW (ref 12.0–15.0)
Immature Granulocytes: 0 %
Lymphocytes Relative: 28 %
Lymphs Abs: 1.5 K/uL (ref 0.7–4.0)
MCH: 28.8 pg (ref 26.0–34.0)
MCHC: 32.3 g/dL (ref 30.0–36.0)
MCV: 89 fL (ref 80.0–100.0)
Monocytes Absolute: 0.6 K/uL (ref 0.1–1.0)
Monocytes Relative: 11 %
Neutro Abs: 3 K/uL (ref 1.7–7.7)
Neutrophils Relative %: 54 %
Platelet Count: 267 K/uL (ref 150–400)
RBC: 4 MIL/uL (ref 3.87–5.11)
RDW: 17.6 % — ABNORMAL HIGH (ref 11.5–15.5)
WBC Count: 5.5 K/uL (ref 4.0–10.5)
nRBC: 0 % (ref 0.0–0.2)

## 2023-10-06 LAB — CMP (CANCER CENTER ONLY)
ALT: 16 U/L (ref 0–44)
AST: 15 U/L (ref 15–41)
Albumin: 3.9 g/dL (ref 3.5–5.0)
Alkaline Phosphatase: 101 U/L (ref 38–126)
Anion gap: 5 (ref 5–15)
BUN: 12 mg/dL (ref 8–23)
CO2: 30 mmol/L (ref 22–32)
Calcium: 9.3 mg/dL (ref 8.9–10.3)
Chloride: 106 mmol/L (ref 98–111)
Creatinine: 0.54 mg/dL (ref 0.44–1.00)
GFR, Estimated: >60 mL/min (ref >60)
Glucose, Bld: 117 mg/dL — ABNORMAL HIGH (ref 70–99)
Potassium: 3.7 mmol/L (ref 3.5–5.1)
Sodium: 141 mmol/L (ref 135–145)
Total Bilirubin: 0.3 mg/dL (ref 0.0–1.2)
Total Protein: 7.2 g/dL (ref 6.5–8.1)

## 2023-10-06 LAB — MAGNESIUM: Magnesium: 1.8 mg/dL (ref 1.7–2.4)

## 2023-10-06 LAB — TOTAL PROTEIN, URINE DIPSTICK: Protein, ur: NEGATIVE mg/dL

## 2023-10-06 LAB — CEA (ACCESS): CEA (CHCC): 40.07 ng/mL — ABNORMAL HIGH (ref 0.00–5.00)

## 2023-10-06 MED ORDER — SODIUM CHLORIDE 0.9 % IV SOLN
6.0000 mg/kg | Freq: Once | INTRAVENOUS | Status: AC
  Administered 2023-10-06: 500 mg via INTRAVENOUS
  Filled 2023-10-06: qty 5

## 2023-10-06 MED ORDER — SODIUM CHLORIDE 0.9 % IV SOLN
400.0000 mg/m2 | Freq: Once | INTRAVENOUS | Status: AC
  Administered 2023-10-06: 768 mg via INTRAVENOUS
  Filled 2023-10-06: qty 38.4

## 2023-10-06 MED ORDER — SODIUM CHLORIDE 0.9 % IV SOLN
2000.0000 mg/m2 | INTRAVENOUS | Status: DC
  Administered 2023-10-06: 3500 mg via INTRAVENOUS
  Filled 2023-10-06: qty 70

## 2023-10-06 MED ORDER — SODIUM CHLORIDE 0.9 % IV SOLN
150.0000 mg/m2 | Freq: Once | INTRAVENOUS | Status: AC
  Administered 2023-10-06: 300 mg via INTRAVENOUS
  Filled 2023-10-06: qty 15

## 2023-10-06 MED ORDER — DEXAMETHASONE SODIUM PHOSPHATE 10 MG/ML IJ SOLN
10.0000 mg | Freq: Once | INTRAMUSCULAR | Status: AC
  Administered 2023-10-06: 10 mg via INTRAVENOUS
  Filled 2023-10-06: qty 1

## 2023-10-06 MED ORDER — SODIUM CHLORIDE 0.9% FLUSH
10.0000 mL | Freq: Once | INTRAVENOUS | Status: AC
  Administered 2023-10-06: 10 mL

## 2023-10-06 MED ORDER — SODIUM CHLORIDE 0.9% FLUSH: 10.0000 mL | INTRAVENOUS | Status: DC | PRN

## 2023-10-06 MED ORDER — FAMOTIDINE IN NACL 20-0.9 MG/50ML-% IV SOLN
20.0000 mg | Freq: Once | INTRAVENOUS | Status: AC
  Administered 2023-10-06: 20 mg via INTRAVENOUS
  Filled 2023-10-06: qty 50

## 2023-10-06 MED ORDER — PALONOSETRON HCL INJECTION 0.25 MG/5ML
0.2500 mg | Freq: Once | INTRAVENOUS | Status: AC
  Administered 2023-10-06: 0.25 mg via INTRAVENOUS
  Filled 2023-10-06: qty 5

## 2023-10-06 MED ORDER — SODIUM CHLORIDE 0.9 % IV SOLN
INTRAVENOUS | Status: DC
Start: 2023-10-06 — End: 2023-10-06

## 2023-10-06 NOTE — Progress Notes (Signed)
 Verbal and Transcribed order w/readback from Dr. Lanny.  OK To tx w/C5D1 FOLFIRI + Panitumumab  - CMP is pending and Infusion nurse will notify Dr. Lanny is CMP parameters are outside tx range for further directions.

## 2023-10-06 NOTE — Patient Instructions (Signed)
 [facility] - A DEPT OF [facility]  Discharge Instructions: Thank you for choosing [facility] to provide your oncology and hematology care.   If you have a lab appointment with the Cancer Center, please go directly to the Cancer Center and check in at the registration area.   Wear comfortable clothing and clothing appropriate for easy access to any Portacath or PICC line.   We strive to give you quality time with your provider. You may need to reschedule your appointment if you arrive late (15 or more minutes).  Arriving late affects you and other patients whose appointments are after yours.  Also, if you miss three or more appointments without notifying the office, you may be dismissed from the clinic at the provider's discretion.      For prescription refill requests, have your pharmacy contact our office and allow 72 hours for refills to be completed.    Today you received the following chemotherapy and/or immunotherapy agents: Panitumumab  (Vectibix ), Irinotecan /Leucovorin /5FU pump      To help prevent nausea and vomiting after your treatment, we encourage you to take your nausea medication as directed.  BELOW ARE SYMPTOMS THAT SHOULD BE REPORTED IMMEDIATELY: *FEVER GREATER THAN 100.4 F (38 C) OR HIGHER *CHILLS OR SWEATING *NAUSEA AND VOMITING THAT IS NOT CONTROLLED WITH YOUR NAUSEA MEDICATION *UNUSUAL SHORTNESS OF BREATH *UNUSUAL BRUISING OR BLEEDING *URINARY PROBLEMS (pain or burning when urinating, or frequent urination) *BOWEL PROBLEMS (unusual diarrhea, constipation, pain near the anus) TENDERNESS IN MOUTH AND THROAT WITH OR WITHOUT PRESENCE OF ULCERS (sore throat, sores in mouth, or a toothache) UNUSUAL RASH, SWELLING OR PAIN  UNUSUAL VAGINAL DISCHARGE OR ITCHING   Items with * indicate a potential emergency and should be followed up as soon as possible or go to the Emergency Department if any problems should occur.  Please show  the CHEMOTHERAPY ALERT CARD or IMMUNOTHERAPY ALERT CARD at check-in to the Emergency Department and triage nurse.  Should you have questions after your visit or need to cancel or reschedule your appointment, please contact [facility] - A DEPT OF [facility]  Dept: [phone]  and follow the prompts.  Office hours are 8:00 a.m. to 4:30 p.m. Monday - Friday. Please note that voicemails left after 4:00 p.m. may not be returned until the following business day.  We are closed weekends and major holidays. You have access to a nurse at all times for urgent questions. Please call the main number to the clinic Dept: [phone] and follow the prompts.   For any non-urgent questions, you may also contact your provider using MyChart. We now offer e-Visits for anyone 62 and older to request care online for non-urgent symptoms. For details visit [email or web address].   Also download the MyChart app! Go to the app store, search "MyChart", open the app, select [place], and log in with your MyChart username and password.  Panitumumab  Injection What is this medication? PANITUMUMAB  (pan i TOOM ue mab) treats colorectal cancer. It works by blocking a protein that causes cancer cells to grow and multiply. This helps to slow or stop the spread of cancer cells. It is a monoclonal antibody. This medicine may be used for other purposes; ask your health care provider or pharmacist if you have questions. COMMON BRAND NAME(S): Vectibix  What should I tell my care team before I take this medication? They need to know if you have any of  these conditions: Eye disease Low levels of magnesium  in the blood Lung disease An unusual or allergic reaction to panitumumab , other medications, foods, dyes, or preservatives Pregnant or trying to get pregnant Breast-feeding How should I use this medication? This medication is injected into a vein. It is given by your care team in a hospital or  clinic setting. Talk to your care team about the use of this medication in children. Special care may be needed. Overdosage: If you think you have taken too much of this medicine contact a poison control center or emergency room at once. NOTE: This medicine is only for you. Do not share this medicine with others. What if I miss a dose? Keep appointments for follow-up doses. It is important not to miss your dose. Call your care team if you are unable to keep an appointment. What may interact with this medication? Bevacizumab  This list may not describe all possible interactions. Give your health care provider a list of all the medicines, herbs, non-prescription drugs, or dietary supplements you use. Also tell them if you smoke, drink alcohol, or use illegal drugs. Some items may interact with your medicine. What should I watch for while using this medication? Your condition will be monitored carefully while you are receiving this medication. This medication may make you feel generally unwell. This is not uncommon as chemotherapy can affect healthy cells as well as cancer cells. Report any side effects. Continue your course of treatment even though you feel ill unless your care team tells you to stop. This medication can make you more sensitive to the sun. Keep out of the sun while receiving this medication and for 2 months after stopping therapy. If you cannot avoid being in the sun, wear protective clothing and sunscreen. Do not use sun lamps, tanning beds, or tanning booths. Check with your care team if you have severe diarrhea, nausea, and vomiting or if you sweat a lot. The loss of too much body fluid may make it dangerous for you to take this medication. This medication may cause serious skin reactions. They can happen weeks to months after starting the medication. Contact your care team right away if you notice fevers or flu-like symptoms with a rash. The rash may be red or purple and then turn  into blisters or peeling of the skin. You may also notice a red rash with swelling of the face, lips, or lymph nodes in your neck or under your arms. Talk to your care team if you may be pregnant. Serious birth defects can occur if you take this medication during pregnancy and for 2 months after the last dose. Contraception is recommended while taking this medication and for 2 months after the last dose. Your care team can help you find the option that works for you. Do not breastfeed while taking this medication and for 2 months after the last dose. This medication may cause infertility. Talk to your care team if you are concerned about your fertility. What side effects may I notice from receiving this medication? Side effects that you should report to your care team as soon as possible: Allergic reactions--skin rash, itching, hives, swelling of the face, lips, tongue, or throat Dry cough, shortness of breath or trouble breathing Eye pain, redness, irritation, or discharge with blurry or decreased vision Infusion reactions--chest pain, shortness of breath or trouble breathing, feeling faint or lightheaded Low magnesium  level--muscle pain or cramps, unusual weakness or fatigue, fast or irregular heartbeat, tremors Low potassium level--muscle  pain or cramps, unusual weakness or fatigue, fast or irregular heartbeat, constipation Redness, blistering, peeling, or loosening of the skin, including inside the mouth Skin reactions on sun-exposed areas Side effects that usually do not require medical attention (report to your care team if they continue or are bothersome): Change in nail shape, thickness, or color Diarrhea Dry skin Fatigue Nausea Vomiting This list may not describe all possible side effects. Call your doctor for medical advice about side effects. You may report side effects to FDA at 1-800-FDA-1088. Where should I keep my medication? This medication is given in a hospital or clinic. It  will not be stored at home. NOTE: This sheet is a summary. It may not cover all possible information. If you have questions about this medicine, talk to your doctor, pharmacist, or health care provider.  2024 Elsevier/Gold Standard (2021-07-09 00:00:00)

## 2023-10-06 NOTE — Progress Notes (Signed)
 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 Liver biopsy 01/08/20 confirmed metastasis from primary colorectal cancer. PET scan showed hypermetabolism to known liver mets, diffuse thoracic and abdominal lymphadenopathy, a 1.8 cm LUL pulmonary nodule, and splenic flexure of colon. -treated with first line FOLFOX 01/25/20 - 10/02/20, Vectibix  added with C2. Oxali discontinued after C18 due to reaction. -switched to Xeloda  10/21/20, dose adjusted due to significant skin toxicity -due to cancer progression, treatment has been changed to FOLFIRI and bevacizumab  on 03/25/22, she is overall tolerating well -will repeat CT scan after next cycle chemo  -Restaging CT scan from 06/16/2022 showed stable to mild decrease in size of treated liver metastasis. No new lesions -She is tolerating chemotherapy well overall, will continue -Her restaging CT scan on August 31, 2022 showed stable liver metastasis, and a stable 7 mm left lung nodule, indeterminate.  I personally reviewed the scan images with patient -Will continue current therapy.  We discussed the option of maintenance therapy with irinotecan  and bevacizumab  in future.  She is tolerating current FOLFIRI and bevacizumab  well, will continue. -Restaging CT 03/24/2023 showed stable  disease.  -restaging CT in 06/2023 showed mild disease progression in liver, I changed Beva to vectibix   - Restaging CT scan on September 27, 2023 showed stable disease, will continue current treatment.  Assessment & Plan Metastatic colon cancer with stable liver and lung metastases Metastatic colon cancer with stable disease. Recent CT scan shows stable liver metastasis and a nodule in the left upper lung with no new cancer progression. Previous mild progression led to a switch to Vectibix , which appears to be stabilizing the disease. - Continue FOLFIRI and Vectibix  treatment as scheduled.  Constipation Constipation managed with Milk of Magnesia every other day. Miralax  was tried but found ineffective. - Continue Milk of Magnesia every other day as needed.  Mild anemia Mild anemia noted but not considered problematic at this time. Blood counts are otherwise normal.  Plan - I personally reviewed her restaging CT scan from September 27, 2023, which showed stable disease - Lab reviewed, adequate for treatment, will proceed chemo FOLFIRI and Vectibix  today, and continue every 2 weeks - Follow-up in 2 weeks   SUMMARY OF ONCOLOGIC HISTORY: Oncology History  metastatic colon cancer to liver  06/20/2019 Procedure   Colonoscopy by Dr Wilhelmenia 06/20/19  IMPRESSION -Seven 3 to 10 mm polyps in the sigmoid colon, in the transverse colon and in the escending colon, removed with a cold snare. Resected and retrireved.  -One 20mm polyp in the descending colon. Biopsies. Tattoes.  -Mediaum sized lipoma in the ascending colon.   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 See Comment B. Colon, Ascending, polyp, Polypectomy:  -TUBULAR ADENOMA -No high grade dysplasia or malignancy.  C. Colon, TRansverse, Polyo, polectomy:  -TUBULAR  ADENOMA -No high grade dysplasia or malignancy.  D. Colon, Sigmoid, Polyp, Polypectomy:   -HYPERPLASTIC POLYP   11/07/2019 Imaging   US  Abdomen 11/07/19  IMPRESSION: 1. Two solid masses in the liver are nonspecific. Recommend MRI abdomen with and without contrast for further evaluation.   12/15/2019 Imaging   MRI Abdomen 12/15/19  IMPRESSION: 1. There are two large masses in the liver with appearance favoring metastatic disease or hepatocellular carcinoma or cholangiocarcinoma. A benign etiology is highly unlikely given the enhancement pattern and associated adenopathy. 2. Considerable porta hepatis and retroperitoneal adenopathy. Some of the confluent porta hepatis tumor is potentially infiltrative and abuts the pancreatic body along its right upper margin, making it difficult to completely exclude the possibility of pancreatic adenocarcinoma primary. Possibilities helpful in further workup might include tissue diagnosis, endoscopic ultrasound, or nuclear medicine PET-CT. 3. Pancreas divisum. 4. Lumbar spondylosis and degenerative disc disease. 5. Despite efforts by the technologist and patient, motion artifact is present on today's exam and could not be eliminated. This reduces exam sensitivity and specificity.   12/22/2019 Procedure   Upper Endoscopy by Dr Rollin 12/22/19  IMPRESSION - One lymph node was visualized and measured in the porta hepatis region. Fine needle aspiration performed    12/22/2019 Initial Biopsy   A. LIVER, PORTA HEPATIS MASS, FINE NEEDLE 12/22/19 ASPIRATION:  FINAL MICROSCOPIC DIAGNOSIS:  - Malignant cells consistent with metastatic adenocarcinoma   01/01/2020 Initial Diagnosis   Intrahepatic cholangiocarcinoma (HCC)   01/08/2020 Initial Biopsy   FINAL MICROSCOPIC DIAGNOSIS:   A. LIVER, LEFT LOBE, BIOPSY:  - Metastatic adenocarcinoma, consistent with a colorectal primary.  See  comment      COMMENT:   Immunohistochemical stains show the tumor cells are positive for CK20  and CDX2 but negative for CK7, consistent with above  interpretation.  Dr. 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   01/24/2020 Procedure   PAC placed 01/24/20   01/25/2020 -  Chemotherapy   first line FOLFOX starting 01/25/2020, Vextibix  added with C2 (02/06/20)   02/06/2020 - 02/06/2022 Chemotherapy   Patient is on Treatment Plan : COLORECTAL Panitumumab  q14d ([name] - Type Gene Only)     06/20/2020 Imaging   CT CAP  IMPRESSION: 1. Continued interval reduction in size and conspicuity of a subsolid nodule of the peripheral left upper lobe. 2. Unchanged prominent pretracheal and subcarinal lymph nodes. 3. Redemonstrated partially calcified low-attenuation liver masses, slightly decreased in size compared to prior examination. 4. Slight interval decrease in size of a portacaval lymph node or conglomerate and retroperitoneal lymph nodes. 5. Findings are consistent with continued treatment response of nodal, pulmonary, and hepatic metastatic disease. 6. Coronary artery disease.   Aortic Atherosclerosis (ICD10-I70.0).     10/09/2020 Imaging   IMPRESSION: 1. Slight decrease in size of dominant liver mass with smaller liver mass within 1-2 mm of prior measurement. 2. Stable appearance of celiac lymph and retroperitoneal lymph nodes. Dominant node with calcification in the gastrohepatic ligament as described. 3. Continued decrease in size of LEFT upper lobe nodule. 4. Three-vessel coronary artery calcification. 5. Aortic atherosclerosis.   03/25/2022 - 07/16/2023 Chemotherapy   Patient is on Treatment Plan : COLORECTAL FOLFIRI + Bevacizumab  q14d     08/31/2022 Imaging    IMPRESSION: 1. Unchanged, densely calcified liver lesions, consistent with treated metastatic disease. 2. Unchanged, irregular subpleural opacity of the peripheral  left upper lobe. 3. Unchanged subcentimeter gastrohepatic  ligament lymph node. 4. No evidence of new metastatic disease in the chest, abdomen, or pelvis. 5. Status post hysterectomy. 6. Coronary artery disease. 7. Aortic valve calcifications. Correlate for echocardiographic evidence of aortic valve dysfunction.     06/22/2023 Imaging   CT Chest, Abdomen, and pelvis with contrast  IMPRESSION: Centrally calcified liver lesions are again seen and slightly increased today. Prominent nodes in the upper retroperitoneum are overall similar. Attention on follow-up.   Stable branching left upper lobe lung nodule.   08/11/2023 -  Chemotherapy   Patient is on Treatment Plan : COLORECTAL FOLFIRI + Panitumumab  q14d        Discussed the use of AI scribe software for clinical note transcription with the patient, who gave verbal consent to proceed.  History of Present Illness Lindsay [name] is a 68 year old female with metastatic colon cancer who presents for follow-up.  Her metastatic colon cancer is stable with no new progression on recent CT scan. She previously experienced mild progression, leading to a switch to Vectibix . She manages her PICC line by wrapping it with a plaster bag while showering and changes the dressing weekly.  Constipation is managed with Milk of Magnesia every other day, which she tolerates well without diarrhea. Her weight remains stable at 171 pounds, and she maintains a good appetite without significant weight gain. She is able to perform all desired activities at home.     All other systems were reviewed with the patient and are negative.  MEDICAL HISTORY:  Past Medical History:  Diagnosis Date   Arthritis    Diabetes mellitus without complication (HCC)    Hypertension    met colon ca to liver 01/2020    SURGICAL HISTORY: Past Surgical History:  Procedure Laterality Date   ABDOMINAL HYSTERECTOMY     ANTERIOR AND POSTERIOR REPAIR WITH SACROSPINOUS FIXATION N/A 07/16/2016   Procedure: ANTERIOR AND POSTERIOR REPAIR  WITH SACROSPINOUS FIXATION;  Surgeon: Curlene Agent, MD;  Location: [facility];  Service: Gynecology;  Laterality: N/A;   CYSTOSCOPY  07/16/2016   Procedure: CYSTOSCOPY;  Surgeon: Curlene Agent, MD;  Location: [facility];  Service: Gynecology;;   ESOPHAGOGASTRODUODENOSCOPY (EGD) WITH PROPOFOL  N/A 12/22/2019   Procedure: ESOPHAGOGASTRODUODENOSCOPY (EGD) WITH PROPOFOL ;  Surgeon: [name], MD;  Location: [facility];  Service: Endoscopy;  Laterality: N/A;   FINE NEEDLE ASPIRATION N/A 12/22/2019   Procedure: FINE NEEDLE ASPIRATION (FNA) LINEAR;  Surgeon: [name], MD;  Location: [facility];  Service: Endoscopy;  Laterality: N/A;   IR IMAGING GUIDED PORT INSERTION  01/24/2020   IR REMOVAL TUN ACCESS W/ PORT W/O FL MOD SED  07/30/2023   LAPAROSCOPIC VAGINAL HYSTERECTOMY WITH SALPINGECTOMY Bilateral 07/16/2016   Procedure: LAPAROSCOPIC ASSISTED VAGINAL HYSTERECTOMY WITH SALPINGECTOMY;  Surgeon: Curlene Agent, MD;  Location: [facility];  Service: Gynecology;  Laterality: Bilateral;   LEFT HEART CATH AND CORONARY ANGIOGRAPHY N/A 05/07/2023   Procedure: LEFT HEART CATH AND CORONARY ANGIOGRAPHY;  Surgeon: Anner Alm ORN, MD;  Location: [facility] CV [facility];  Service: Cardiovascular;  Laterality: N/A;   MYOMECTOMY ABDOMINAL APPROACH     THYROID SURGERY     tyroid     UPPER ESOPHAGEAL ENDOSCOPIC ULTRASOUND (EUS) N/A 12/22/2019   Procedure: UPPER ESOPHAGEAL ENDOSCOPIC ULTRASOUND (EUS);  Surgeon: [name], MD;  Location: [facility];  Service: Endoscopy;  Laterality: N/A;    I have reviewed the social history and family history with the patient and they are unchanged from previous note.  ALLERGIES:  is allergic to oxaliplatin .  MEDICATIONS:  Current Outpatient Medications  Medication Sig Dispense Refill   aspirin  EC 81 MG tablet Take 1 tablet (81 mg total) by mouth daily. Swallow whole. 90 tablet 0   atorvastatin  (LIPITOR) 80 MG tablet Take 1 tablet (80 mg total) by mouth daily. 90 tablet 0    clindamycin  (CLEOCIN -T) 1 % lotion Apply topically as needed. 60 mL 0   clopidogrel  (PLAVIX ) 75 MG tablet Take 1 tablet (75 mg total) by mouth daily. 90 tablet 0   doxycycline  (VIBRA -TABS) 100 MG tablet Take 1 tablet (100 mg total) by mouth 2 (two) times daily. 60 tablet 3   metoprolol  succinate (TOPROL  XL) 25 MG 24 hr tablet Take 1 tablet (25 mg total) by mouth daily. Hold if systolic BP (top number) is less than 100 and/or heart rate is less than 55 90 tablet 3   nitroGLYCERIN  (NITROSTAT ) 0.4 MG SL tablet Place 1 tablet (0.4 mg total) under the tongue every 5 (five) minutes as needed for chest pain. 25 tablet 11   ranolazine  (RANEXA ) 500 MG 12 hr tablet Take 1 tablet (500 mg total) by mouth 2 (two) times daily. 180 tablet 3   No current facility-administered medications for this visit.   Facility-Administered Medications Ordered in Other Visits  Medication Dose Route Frequency Provider Last Rate Last Admin   0.9 %  sodium chloride  infusion   Intravenous Continuous [name], MD   Stopped at 10/06/23 1517   fluorouracil  (ADRUCIL ) 3,500 mg in sodium chloride  0.9 % 80 mL chemo infusion  2,000 mg/m2 (Treatment Plan Recorded) Intravenous 1 day or 1 dose [name], MD   Infusion Verify at 10/06/23 1601   sodium chloride  flush (NS) 0.9 % injection 10 mL  10 mL Intracatheter PRN [name], MD        PHYSICAL EXAMINATION: ECOG PERFORMANCE STATUS: 1 - Symptomatic but completely ambulatory  Vitals:   10/06/23 0943  BP: 120/68  Pulse: (!) 55  Resp: 17  Temp: (!) 97.3 F (36.3 C)  SpO2: 99%   Wt Readings from Last 3 Encounters:  10/06/23 171 lb 4.8 oz (77.7 kg)  09/22/23 171 lb 12.8 oz (77.9 kg)  09/07/23 168 lb 12.8 oz (76.6 kg)     GENERAL:alert, no distress and comfortable SKIN: skin color, texture, turgor are normal, no rashes or significant lesions EYES: normal, Conjunctiva are pink and non-injected, sclera clear NECK: supple, thyroid normal size, non-tender, without nodularity LYMPH:   no palpable lymphadenopathy in the cervical, axillary  LUNGS: clear to auscultation and percussion with normal breathing effort HEART: regular rate & rhythm and no murmurs and no lower extremity edema ABDOMEN:abdomen soft, non-tender and normal bowel sounds Musculoskeletal:no cyanosis of digits and no clubbing  NEURO: alert & oriented x 3 with fluent speech, no focal motor/sensory deficits  Physical Exam MEASUREMENTS: Weight- 171.  LABORATORY DATA:  I have reviewed the data as listed    Latest Ref Rng & Units 10/06/2023    9:20 AM 09/22/2023    9:10 AM 09/07/2023    7:57 AM  CBC  WBC 4.0 - 10.5 K/uL 5.5  5.9  5.0   Hemoglobin 12.0 - 15.0 g/dL 88.4  88.9  89.0   Hematocrit 36.0 - 46.0 % 35.6  33.8  33.8   Platelets 150 - 400 K/uL 267  270  269         Latest Ref Rng & Units 10/06/2023    9:20 AM 09/22/2023  9:10 AM 09/07/2023    7:57 AM  CMP  Glucose 70 - 99 mg/dL 882  882  875   BUN 8 - 23 mg/dL 12  7  11    Creatinine 0.44 - 1.00 mg/dL 9.45  9.51  9.28   Sodium 135 - 145 mmol/L 141  141  138   Potassium 3.5 - 5.1 mmol/L 3.7  3.3  3.8   Chloride 98 - 111 mmol/L 106  106  103   CO2 22 - 32 mmol/L 30  30  28    Calcium  8.9 - 10.3 mg/dL 9.3  9.1  9.6   Total Protein 6.5 - 8.1 g/dL 7.2  7.1  7.6   Total Bilirubin 0.0 - 1.2 mg/dL 0.3  0.3  0.3   Alkaline Phos 38 - 126 U/L 101  91  107   AST 15 - 41 U/L 15  15  15    ALT 0 - 44 U/L 16  17  15        RADIOGRAPHIC STUDIES: I have personally reviewed the radiological images as listed and agreed with the findings in the report. No results found.    Orders Placed This Encounter  Procedures   CBC with Differential (Cancer Center Only)    Standing Status:   Future    Expected Date:   12/01/2023    Expiration Date:   11/30/2024   CMP (Cancer Center only)    Standing Status:   Future    Expected Date:   12/01/2023    Expiration Date:   11/30/2024   Magnesium     Standing Status:   Future    Expected Date:   12/01/2023     Expiration Date:   11/30/2024   CBC with Differential (Cancer Center Only)    Standing Status:   Future    Expected Date:   12/15/2023    Expiration Date:   12/14/2024   CMP (Cancer Center only)    Standing Status:   Future    Expected Date:   12/15/2023    Expiration Date:   12/14/2024   Magnesium     Standing Status:   Future    Expected Date:   12/15/2023    Expiration Date:   12/14/2024   All questions were answered. The patient knows to call the clinic with any problems, questions or concerns. No barriers to learning was detected. The total time spent in the appointment was 30 minutes, including review of chart and various tests results, discussions about plan of care and coordination of care plan     Onita Mattock, MD 10/06/2023

## 2023-10-08 ENCOUNTER — Inpatient Hospital Stay: Payer: Medicare (Managed Care) | Attending: Hematology

## 2023-10-08 VITALS — BP 109/70 | HR 73 | Temp 98.6°F | Resp 16

## 2023-10-08 DIAGNOSIS — Z95828 Presence of other vascular implants and grafts: Secondary | ICD-10-CM

## 2023-10-08 DIAGNOSIS — Z90710 Acquired absence of both cervix and uterus: Secondary | ICD-10-CM | POA: Insufficient documentation

## 2023-10-08 DIAGNOSIS — C7800 Secondary malignant neoplasm of unspecified lung: Secondary | ICD-10-CM | POA: Diagnosis not present

## 2023-10-08 DIAGNOSIS — C787 Secondary malignant neoplasm of liver and intrahepatic bile duct: Secondary | ICD-10-CM | POA: Diagnosis present

## 2023-10-08 DIAGNOSIS — Z79899 Other long term (current) drug therapy: Secondary | ICD-10-CM | POA: Diagnosis not present

## 2023-10-08 DIAGNOSIS — D649 Anemia, unspecified: Secondary | ICD-10-CM | POA: Insufficient documentation

## 2023-10-08 DIAGNOSIS — Z452 Encounter for adjustment and management of vascular access device: Secondary | ICD-10-CM | POA: Insufficient documentation

## 2023-10-08 DIAGNOSIS — Z5112 Encounter for antineoplastic immunotherapy: Secondary | ICD-10-CM | POA: Insufficient documentation

## 2023-10-08 DIAGNOSIS — Z5111 Encounter for antineoplastic chemotherapy: Secondary | ICD-10-CM | POA: Diagnosis not present

## 2023-10-08 DIAGNOSIS — C221 Intrahepatic bile duct carcinoma: Secondary | ICD-10-CM

## 2023-10-08 DIAGNOSIS — C19 Malignant neoplasm of rectosigmoid junction: Secondary | ICD-10-CM | POA: Diagnosis present

## 2023-10-08 MED ORDER — HEPARIN SOD (PORK) LOCK FLUSH 100 UNIT/ML IV SOLN
500.0000 [IU] | Freq: Once | INTRAVENOUS | Status: AC
Start: 2023-10-08 — End: 2023-10-08
  Administered 2023-10-08: 500 [IU]

## 2023-10-08 MED ORDER — SODIUM CHLORIDE 0.9% FLUSH
10.0000 mL | Freq: Once | INTRAVENOUS | Status: AC
Start: 2023-10-08 — End: 2023-10-08
  Administered 2023-10-08: 10 mL

## 2023-10-11 ENCOUNTER — Other Ambulatory Visit: Payer: Self-pay

## 2023-10-11 DIAGNOSIS — I25118 Atherosclerotic heart disease of native coronary artery with other forms of angina pectoris: Secondary | ICD-10-CM

## 2023-10-11 DIAGNOSIS — R072 Precordial pain: Secondary | ICD-10-CM

## 2023-10-11 MED ORDER — RANOLAZINE ER 500 MG PO TB12
500.0000 mg | ORAL_TABLET | Freq: Two times a day (BID) | ORAL | 3 refills | Status: AC
Start: 2023-10-11 — End: ?

## 2023-10-11 MED ORDER — NITROGLYCERIN 0.4 MG SL SUBL: 0.4000 mg | SUBLINGUAL_TABLET | SUBLINGUAL | 3 refills | Status: DC | PRN

## 2023-10-13 ENCOUNTER — Inpatient Hospital Stay: Payer: Medicare (Managed Care)

## 2023-10-13 DIAGNOSIS — Z5112 Encounter for antineoplastic immunotherapy: Secondary | ICD-10-CM | POA: Diagnosis not present

## 2023-10-13 DIAGNOSIS — C221 Intrahepatic bile duct carcinoma: Secondary | ICD-10-CM

## 2023-10-13 DIAGNOSIS — Z95828 Presence of other vascular implants and grafts: Secondary | ICD-10-CM

## 2023-10-13 DIAGNOSIS — C19 Malignant neoplasm of rectosigmoid junction: Secondary | ICD-10-CM | POA: Diagnosis not present

## 2023-10-13 MED ORDER — SODIUM CHLORIDE 0.9% FLUSH
10.0000 mL | Freq: Once | INTRAVENOUS | Status: AC
Start: 2023-10-13 — End: 2023-10-13
  Administered 2023-10-13: 10 mL

## 2023-10-18 ENCOUNTER — Other Ambulatory Visit: Payer: Self-pay

## 2023-10-18 DIAGNOSIS — I350 Nonrheumatic aortic (valve) stenosis: Secondary | ICD-10-CM

## 2023-10-18 NOTE — Progress Notes (Signed)
 New echo order placed with expected date November 2025.

## 2023-10-19 ENCOUNTER — Other Ambulatory Visit: Payer: Self-pay

## 2023-10-20 ENCOUNTER — Encounter: Payer: Self-pay | Admitting: Hematology

## 2023-10-20 ENCOUNTER — Inpatient Hospital Stay: Payer: Medicare (Managed Care)

## 2023-10-20 ENCOUNTER — Inpatient Hospital Stay: Payer: Medicare (Managed Care) | Admitting: Hematology

## 2023-10-20 VITALS — BP 140/70 | HR 94 | Temp 97.8°F | Resp 17 | Ht 66.0 in | Wt 173.1 lb

## 2023-10-20 DIAGNOSIS — C19 Malignant neoplasm of rectosigmoid junction: Secondary | ICD-10-CM | POA: Diagnosis not present

## 2023-10-20 DIAGNOSIS — C221 Intrahepatic bile duct carcinoma: Secondary | ICD-10-CM | POA: Diagnosis not present

## 2023-10-20 DIAGNOSIS — C189 Malignant neoplasm of colon, unspecified: Secondary | ICD-10-CM

## 2023-10-20 DIAGNOSIS — Z5112 Encounter for antineoplastic immunotherapy: Secondary | ICD-10-CM | POA: Diagnosis not present

## 2023-10-20 DIAGNOSIS — Z95828 Presence of other vascular implants and grafts: Secondary | ICD-10-CM

## 2023-10-20 LAB — CMP (CANCER CENTER ONLY)
ALT: 15 U/L (ref 0–44)
AST: 15 U/L (ref 15–41)
Albumin: 4 g/dL (ref 3.5–5.0)
Alkaline Phosphatase: 111 U/L (ref 38–126)
Anion gap: 5 (ref 5–15)
BUN: 8 mg/dL (ref 8–23)
CO2: 30 mmol/L (ref 22–32)
Calcium: 9.1 mg/dL (ref 8.9–10.3)
Chloride: 106 mmol/L (ref 98–111)
Creatinine: 0.55 mg/dL (ref 0.44–1.00)
GFR, Estimated: >60 mL/min (ref >60)
Glucose, Bld: 109 mg/dL — ABNORMAL HIGH (ref 70–99)
Potassium: 3.6 mmol/L (ref 3.5–5.1)
Sodium: 141 mmol/L (ref 135–145)
Total Bilirubin: 0.3 mg/dL (ref 0.0–1.2)
Total Protein: 7 g/dL (ref 6.5–8.1)

## 2023-10-20 LAB — CBC WITH DIFFERENTIAL (CANCER CENTER ONLY)
Abs Immature Granulocytes: 0.03 K/uL (ref 0.00–0.07)
Basophils Absolute: 0.1 K/uL (ref 0.0–0.1)
Basophils Relative: 1 %
Eosinophils Absolute: 0.2 K/uL (ref 0.0–0.5)
Eosinophils Relative: 2 %
HCT: 34.6 % — ABNORMAL LOW (ref 36.0–46.0)
Hemoglobin: 11.2 g/dL — ABNORMAL LOW (ref 12.0–15.0)
Immature Granulocytes: 0 %
Lymphocytes Relative: 15 %
Lymphs Abs: 1.2 K/uL (ref 0.7–4.0)
MCH: 28.6 pg (ref 26.0–34.0)
MCHC: 32.4 g/dL (ref 30.0–36.0)
MCV: 88.3 fL (ref 80.0–100.0)
Monocytes Absolute: 0.7 K/uL (ref 0.1–1.0)
Monocytes Relative: 9 %
Neutro Abs: 6 K/uL (ref 1.7–7.7)
Neutrophils Relative %: 73 %
Platelet Count: 256 K/uL (ref 150–400)
RBC: 3.92 MIL/uL (ref 3.87–5.11)
RDW: 16.8 % — ABNORMAL HIGH (ref 11.5–15.5)
WBC Count: 8.2 K/uL (ref 4.0–10.5)
nRBC: 0 % (ref 0.0–0.2)

## 2023-10-20 LAB — TOTAL PROTEIN, URINE DIPSTICK: Protein, ur: NEGATIVE mg/dL

## 2023-10-20 LAB — MAGNESIUM: Magnesium: 1.7 mg/dL (ref 1.7–2.4)

## 2023-10-20 LAB — CEA (ACCESS): CEA (CHCC): 36.37 ng/mL — ABNORMAL HIGH (ref 0.00–5.00)

## 2023-10-20 MED ORDER — SODIUM CHLORIDE 0.9 % IV SOLN: INTRAVENOUS | Status: DC

## 2023-10-20 MED ORDER — DEXAMETHASONE SODIUM PHOSPHATE 10 MG/ML IJ SOLN
10.0000 mg | Freq: Once | INTRAMUSCULAR | Status: AC
  Administered 2023-10-20 (×2): 10 mg via INTRAVENOUS
  Filled 2023-10-20: qty 1

## 2023-10-20 MED ORDER — FAMOTIDINE IN NACL 20-0.9 MG/50ML-% IV SOLN
20.0000 mg | Freq: Once | INTRAVENOUS | Status: AC
  Administered 2023-10-20 (×2): 20 mg via INTRAVENOUS
  Filled 2023-10-20: qty 50

## 2023-10-20 MED ORDER — ATROPINE SULFATE 1 MG/ML IV SOLN: 0.5000 mg | Freq: Once | INTRAVENOUS | Status: DC | PRN

## 2023-10-20 MED ORDER — SODIUM CHLORIDE 0.9 % IV SOLN
400.0000 mg/m2 | Freq: Once | INTRAVENOUS | Status: AC
  Administered 2023-10-20 (×2): 768 mg via INTRAVENOUS
  Filled 2023-10-20: qty 25

## 2023-10-20 MED ORDER — SODIUM CHLORIDE 0.9 % IV SOLN
6.0000 mg/kg | Freq: Once | INTRAVENOUS | Status: AC
  Administered 2023-10-20 (×2): 500 mg via INTRAVENOUS
  Filled 2023-10-20: qty 5

## 2023-10-20 MED ORDER — SODIUM CHLORIDE 0.9% FLUSH
10.0000 mL | Freq: Once | INTRAVENOUS | Status: AC
Start: 2023-10-20 — End: 2023-10-20
  Administered 2023-10-20 (×2): 10 mL

## 2023-10-20 MED ORDER — SODIUM CHLORIDE 0.9 % IV SOLN
150.0000 mg/m2 | Freq: Once | INTRAVENOUS | Status: AC
  Administered 2023-10-20 (×2): 300 mg via INTRAVENOUS
  Filled 2023-10-20: qty 15

## 2023-10-20 MED ORDER — SODIUM CHLORIDE 0.9 % IV SOLN
2000.0000 mg/m2 | INTRAVENOUS | Status: DC
  Administered 2023-10-20 (×2): 3500 mg via INTRAVENOUS
  Filled 2023-10-20: qty 70

## 2023-10-20 MED ORDER — PALONOSETRON HCL INJECTION 0.25 MG/5ML
0.2500 mg | Freq: Once | INTRAVENOUS | Status: AC
  Administered 2023-10-20 (×2): 0.25 mg via INTRAVENOUS
  Filled 2023-10-20: qty 5

## 2023-10-20 NOTE — Patient Instructions (Signed)
 [facility] - A DEPT OF [facility]  Discharge Instructions: Thank you for choosing [facility] to provide your oncology and hematology care.   If you have a lab appointment with the Cancer Center, please go directly to the Cancer Center and check in at the registration area.   Wear comfortable clothing and clothing appropriate for easy access to any Portacath or PICC line.   We strive to give you quality time with your provider. You may need to reschedule your appointment if you arrive late (15 or more minutes).  Arriving late affects you and other patients whose appointments are after yours.  Also, if you miss three or more appointments without notifying the office, you may be dismissed from the clinic at the provider's discretion.      For prescription refill requests, have your pharmacy contact our office and allow 72 hours for refills to be completed.    Today you received the following chemotherapy and/or immunotherapy agents: Vectibix , Irinotecan , Leucovorin , 5FU      To help prevent nausea and vomiting after your treatment, we encourage you to take your nausea medication as directed.  BELOW ARE SYMPTOMS THAT SHOULD BE REPORTED IMMEDIATELY: *FEVER GREATER THAN 100.4 F (38 C) OR HIGHER *CHILLS OR SWEATING *NAUSEA AND VOMITING THAT IS NOT CONTROLLED WITH YOUR NAUSEA MEDICATION *UNUSUAL SHORTNESS OF BREATH *UNUSUAL BRUISING OR BLEEDING *URINARY PROBLEMS (pain or burning when urinating, or frequent urination) *BOWEL PROBLEMS (unusual diarrhea, constipation, pain near the anus) TENDERNESS IN MOUTH AND THROAT WITH OR WITHOUT PRESENCE OF ULCERS (sore throat, sores in mouth, or a toothache) UNUSUAL RASH, SWELLING OR PAIN  UNUSUAL VAGINAL DISCHARGE OR ITCHING   Items with * indicate a potential emergency and should be followed up as soon as possible or go to the Emergency Department if any problems should occur.  Please show the CHEMOTHERAPY  ALERT CARD or IMMUNOTHERAPY ALERT CARD at check-in to the Emergency Department and triage nurse.  Should you have questions after your visit or need to cancel or reschedule your appointment, please contact [facility] - A DEPT OF [facility]  Dept: [phone]  and follow the prompts.  Office hours are 8:00 a.m. to 4:30 p.m. Monday - Friday. Please note that voicemails left after 4:00 p.m. may not be returned until the following business day.  We are closed weekends and major holidays. You have access to a nurse at all times for urgent questions. Please call the main number to the clinic Dept: [phone] and follow the prompts.   For any non-urgent questions, you may also contact your provider using MyChart. We now offer e-Visits for anyone 35 and older to request care online for non-urgent symptoms. For details visit [email or web address].   Also download the MyChart app! Go to the app store, search "MyChart", open the app, select [place], and log in with your MyChart username and password.

## 2023-10-20 NOTE — Assessment & Plan Note (Signed)
 MSS, [name]/[name] wildtype -diagnosed 12/2019 by porta hepatis LN biopsy during EUS for work up of abdominal pain and liver lesions seen on US  and MRI. Liver biopsy 01/08/20 confirmed metastasis from primary colorectal cancer. PET scan showed hypermetabolism to known liver mets, diffuse thoracic and abdominal lymphadenopathy, a 1.8 cm LUL pulmonary nodule, and splenic flexure of colon. -treated with first line FOLFOX 01/25/20 - 10/02/20, Vectibix  added with C2. Oxali discontinued after C18 due to reaction. -switched to Xeloda  10/21/20, dose adjusted due to significant skin toxicity -due to cancer progression, treatment has been changed to FOLFIRI and bevacizumab  on 03/25/22, she is overall tolerating well -will repeat CT scan after next cycle chemo  -Restaging CT scan from 06/16/2022 showed stable to mild decrease in size of treated liver metastasis. No new lesions -She is tolerating chemotherapy well overall, will continue -Her restaging CT scan on August 31, 2022 showed stable liver metastasis, and a stable 7 mm left lung nodule, indeterminate.  I personally reviewed the scan images with patient -Will continue current therapy.  We discussed the option of maintenance therapy with irinotecan  and bevacizumab  in future.  She is tolerating current FOLFIRI and bevacizumab  well, will continue. -Restaging CT 03/24/2023 showed stable disease.  -restaging CT in 06/2023 showed mild disease progression in liver, I changed Beva to vectibix   - Restaging CT scan on September 27, 2023 showed stable disease, will continue current treatment.

## 2023-10-20 NOTE — Progress Notes (Signed)
 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 Liver biopsy 01/08/20 confirmed metastasis from primary colorectal cancer. PET scan showed hypermetabolism to known liver mets, diffuse thoracic and abdominal lymphadenopathy, a 1.8 cm LUL pulmonary nodule, and splenic flexure of colon. -treated with first line FOLFOX 01/25/20 - 10/02/20, Vectibix  added with C2. Oxali discontinued after C18 due to reaction. -switched to Xeloda  10/21/20, dose adjusted due to significant skin toxicity -due to cancer progression, treatment has been changed to FOLFIRI and bevacizumab  on 03/25/22, she is overall tolerating well -will repeat CT scan after next cycle chemo  -Restaging CT scan from 06/16/2022 showed stable to mild decrease in size of treated liver metastasis. No new lesions -She is tolerating chemotherapy well overall, will continue -Her restaging CT scan on August 31, 2022 showed stable liver metastasis, and a stable 7 mm left lung nodule, indeterminate.  I personally reviewed the scan images with patient -Will continue current therapy.  We discussed the option of maintenance therapy with irinotecan  and bevacizumab  in future.  She is tolerating current FOLFIRI and bevacizumab  well, will continue. -Restaging CT 03/24/2023 showed stable disease.  -restaging CT in 06/2023 showed mild disease progression in liver, I  changed Beva to vectibix   - Restaging CT scan on September 27, 2023 showed stable disease, will continue current treatment.  Assessment & Plan Metastatic cancer under active treatment Metastatic cancer is well-managed with no new symptoms or significant side effects. Recent scan in July, next scan pending. She is tolerating treatment well. - Proceed with scheduled infusion at 11:00 AM  Mild anemia Mild anemia identified on CBC with no significant clinical concerns.  Plan - She is tolerating chemotherapy well, lab reviewed, will proceed to treatment today - Follow-up in 2 weeks before chemo   SUMMARY OF ONCOLOGIC HISTORY: Oncology History  metastatic colon cancer to liver  06/20/2019 Procedure   Colonoscopy by Dr Wilhelmenia 06/20/19  IMPRESSION -Seven 3 to 10 mm polyps in the sigmoid colon, in the transverse colon and in the escending colon, removed with a cold snare. Resected and retrireved.  -One 20mm polyp in the descending colon. Biopsies. Tattoes.  -Mediaum sized lipoma in the ascending colon.   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 See Comment B. Colon, Ascending, polyp, Polypectomy:  -TUBULAR ADENOMA -No high grade dysplasia or malignancy.  C. Colon, TRansverse, Polyo, polectomy:  -TUBULAR ADENOMA -No high grade dysplasia or malignancy.  D. Colon, Sigmoid, Polyp, Polypectomy:  -HYPERPLASTIC POLYP   11/07/2019 Imaging   US  Abdomen 11/07/19  IMPRESSION: 1. Two solid masses in the liver are nonspecific. Recommend MRI abdomen with and without contrast for further evaluation.   12/15/2019 Imaging   MRI Abdomen 12/15/19  IMPRESSION: 1. There are two large masses in the liver with appearance favoring metastatic disease or hepatocellular carcinoma or cholangiocarcinoma. A benign etiology is highly unlikely given the enhancement  pattern and associated adenopathy. 2. Considerable porta hepatis and  retroperitoneal adenopathy. Some of the confluent porta hepatis tumor is potentially infiltrative and abuts the pancreatic body along its right upper margin, making it difficult to completely exclude the possibility of pancreatic adenocarcinoma primary. Possibilities helpful in further workup might include tissue diagnosis, endoscopic ultrasound, or nuclear medicine PET-CT. 3. Pancreas divisum. 4. Lumbar spondylosis and degenerative disc disease. 5. Despite efforts by the technologist and patient, motion artifact is present on today's exam and could not be eliminated. This reduces exam sensitivity and specificity.   12/22/2019 Procedure   Upper Endoscopy by Dr Rollin 12/22/19  IMPRESSION - One lymph node was visualized and measured in the porta hepatis region. Fine needle aspiration performed    12/22/2019 Initial Biopsy   A. LIVER, PORTA HEPATIS MASS, FINE NEEDLE 12/22/19 ASPIRATION:  FINAL MICROSCOPIC DIAGNOSIS:  - Malignant cells consistent with metastatic adenocarcinoma   01/01/2020 Initial Diagnosis   Intrahepatic cholangiocarcinoma (HCC)   01/08/2020 Initial Biopsy   FINAL MICROSCOPIC DIAGNOSIS:   A. LIVER, LEFT LOBE, BIOPSY:  - Metastatic adenocarcinoma, consistent with a colorectal primary.  See  comment      COMMENT:   Immunohistochemical stains show the tumor cells are positive for CK20  and CDX2 but negative for CK7, consistent with above interpretation.  Dr. 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   01/24/2020 Procedure   PAC placed 01/24/20   01/25/2020 -  Chemotherapy   first line FOLFOX starting 01/25/2020, Vextibix  added with C2 (02/06/20)   02/06/2020 - 02/06/2022 Chemotherapy   Patient is on Treatment Plan : COLORECTAL Panitumumab  q14d ([name] Wild - Type Gene Only)     06/20/2020 Imaging   CT CAP   IMPRESSION: 1. Continued interval reduction in size and conspicuity of a subsolid nodule of the peripheral left upper lobe. 2. Unchanged prominent pretracheal and subcarinal lymph nodes. 3. Redemonstrated partially calcified low-attenuation liver masses, slightly decreased in size compared to prior examination. 4. Slight interval decrease in size of a portacaval lymph node or conglomerate and retroperitoneal lymph nodes. 5. Findings are consistent with continued treatment response of nodal, pulmonary, and hepatic metastatic disease. 6. Coronary artery disease.   Aortic Atherosclerosis (ICD10-I70.0).     10/09/2020 Imaging   IMPRESSION: 1. Slight decrease in size of dominant liver mass with smaller liver mass within 1-2 mm of prior measurement. 2. Stable appearance of celiac lymph and retroperitoneal lymph nodes. Dominant node with calcification in the gastrohepatic ligament as described. 3. Continued decrease in size of LEFT upper lobe nodule. 4. Three-vessel coronary artery calcification. 5. Aortic atherosclerosis.   03/25/2022 - 07/16/2023 Chemotherapy   Patient is on Treatment Plan : COLORECTAL FOLFIRI + Bevacizumab  q14d     08/31/2022 Imaging    IMPRESSION: 1. Unchanged, densely calcified liver lesions, consistent with treated metastatic disease. 2. Unchanged, irregular subpleural opacity of the peripheral left upper lobe. 3. Unchanged subcentimeter gastrohepatic ligament lymph node. 4. No evidence of new metastatic disease in the chest, abdomen, or pelvis. 5. Status post hysterectomy. 6. Coronary artery disease. 7. Aortic valve calcifications. Correlate for echocardiographic evidence of aortic valve dysfunction.     06/22/2023 Imaging   CT Chest, Abdomen, and pelvis with contrast  IMPRESSION: Centrally calcified liver lesions are again seen and slightly increased today. Prominent nodes in the upper retroperitoneum are overall similar. Attention on follow-up.  Stable  branching left upper lobe lung nodule.   08/11/2023 -  Chemotherapy   Patient is on Treatment Plan : COLORECTAL FOLFIRI + Panitumumab  q14d        Discussed the use of AI scribe software for clinical note transcription with the patient, who gave verbal consent to proceed.  History of Present Illness Lindsay [name] is a 68 year old female with metastatic cancer who presents for follow-up.  She has not experienced any new symptoms since her last visit and feels well. She has gained two pounds, attributed to improved nutrition. There is no discomfort, rash, or diarrhea. Her recent laboratory results, including protein, CBC, kidney, and liver function, were reviewed. She continues to take magnesium  every other day and uses prune juice to manage her magnesium  levels, occasionally taking milk of magnesia to prevent severe symptoms.     All other systems were reviewed with the patient and are negative.  MEDICAL HISTORY:  Past Medical History:  Diagnosis Date   Arthritis    Diabetes mellitus without complication (HCC)    Hypertension    met colon ca to liver 01/2020    SURGICAL HISTORY: Past Surgical History:  Procedure Laterality Date   ABDOMINAL HYSTERECTOMY     ANTERIOR AND POSTERIOR REPAIR WITH SACROSPINOUS FIXATION N/A 07/16/2016   Procedure: ANTERIOR AND POSTERIOR REPAIR WITH SACROSPINOUS FIXATION;  Surgeon: Curlene Agent, MD;  Location: [facility];  Service: Gynecology;  Laterality: N/A;   CYSTOSCOPY  07/16/2016   Procedure: CYSTOSCOPY;  Surgeon: Curlene Agent, MD;  Location: [facility];  Service: Gynecology;;   ESOPHAGOGASTRODUODENOSCOPY (EGD) WITH PROPOFOL  N/A 12/22/2019   Procedure: ESOPHAGOGASTRODUODENOSCOPY (EGD) WITH PROPOFOL ;  Surgeon: [name], MD;  Location: [facility];  Service: Endoscopy;  Laterality: N/A;   FINE NEEDLE ASPIRATION N/A 12/22/2019   Procedure: FINE NEEDLE ASPIRATION (FNA) LINEAR;  Surgeon: [name], MD;  Location: [facility];  Service: Endoscopy;   Laterality: N/A;   IR IMAGING GUIDED PORT INSERTION  01/24/2020   IR REMOVAL TUN ACCESS W/ PORT W/O FL MOD SED  07/30/2023   LAPAROSCOPIC VAGINAL HYSTERECTOMY WITH SALPINGECTOMY Bilateral 07/16/2016   Procedure: LAPAROSCOPIC ASSISTED VAGINAL HYSTERECTOMY WITH SALPINGECTOMY;  Surgeon: Curlene Agent, MD;  Location: [facility];  Service: Gynecology;  Laterality: Bilateral;   LEFT HEART CATH AND CORONARY ANGIOGRAPHY N/A 05/07/2023   Procedure: LEFT HEART CATH AND CORONARY ANGIOGRAPHY;  Surgeon: Anner Alm ORN, MD;  Location: [facility];  Service: Cardiovascular;  Laterality: N/A;   MYOMECTOMY ABDOMINAL APPROACH     THYROID SURGERY     tyroid     UPPER ESOPHAGEAL ENDOSCOPIC ULTRASOUND (EUS) N/A 12/22/2019   Procedure: UPPER ESOPHAGEAL ENDOSCOPIC ULTRASOUND (EUS);  Surgeon: [name], MD;  Location: [facility];  Service: Endoscopy;  Laterality: N/A;    I have reviewed the social history and family history with the patient and they are unchanged from previous note.  ALLERGIES:  is allergic to oxaliplatin .  MEDICATIONS:  Current Outpatient Medications  Medication Sig Dispense Refill   aspirin  EC 81 MG tablet Take 1 tablet (81 mg total) by mouth daily. Swallow whole. 90 tablet 0   atorvastatin  (LIPITOR) 80 MG tablet Take 1 tablet (80 mg total) by mouth daily. 90 tablet 0   clindamycin  (CLEOCIN -T) 1 % lotion Apply topically as needed. 60 mL 0   clopidogrel  (PLAVIX ) 75 MG tablet Take 1 tablet (75 mg total) by mouth daily. 90 tablet 0   doxycycline  (VIBRA -TABS) 100 MG tablet Take 1 tablet (100 mg total) by  mouth 2 (two) times daily. 60 tablet 3   metoprolol  succinate (TOPROL  XL) 25 MG 24 hr tablet Take 1 tablet (25 mg total) by mouth daily. Hold if systolic BP (top number) is less than 100 and/or heart rate is less than 55 90 tablet 3   nitroGLYCERIN  (NITROSTAT ) 0.4 MG SL tablet Place 1 tablet (0.4 mg total) under the tongue every 5 (five) minutes as needed for chest pain. 75 tablet 3    ranolazine  (RANEXA ) 500 MG 12 hr tablet Take 1 tablet (500 mg total) by mouth 2 (two) times daily. 180 tablet 3   No current facility-administered medications for this visit.   Facility-Administered Medications Ordered in Other Visits  Medication Dose Route Frequency Provider Last Rate Last Admin   0.9 %  sodium chloride  infusion   Intravenous Continuous [name], MD   Stopped at 10/20/23 1433   atropine  injection 0.5 mg  0.5 mg Intravenous Once PRN [name], MD       fluorouracil  (ADRUCIL ) 3,500 mg in sodium chloride  0.9 % 80 mL chemo infusion  2,000 mg/m2 (Treatment Plan Recorded) Intravenous 1 day or 1 dose [name], MD   Infusion Verify at 10/20/23 1510    PHYSICAL EXAMINATION: ECOG PERFORMANCE STATUS: 1 - Symptomatic but completely ambulatory  Vitals:   10/20/23 1053 10/20/23 1055  BP: (!) 150/80 (!) 140/70  Pulse: 94   Resp: 17   Temp: 97.8 F (36.6 C)   SpO2: 98%    Wt Readings from Last 3 Encounters:  10/20/23 173 lb 1.6 oz (78.5 kg)  10/06/23 171 lb 4.8 oz (77.7 kg)  09/22/23 171 lb 12.8 oz (77.9 kg)     GENERAL:alert, no distress and comfortable SKIN: skin color, texture, turgor are normal, no rashes or significant lesions EYES: normal, Conjunctiva are pink and non-injected, sclera clear Musculoskeletal:no cyanosis of digits and no clubbing  NEURO: alert & oriented x 3 with fluent speech, no focal motor/sensory deficits  Physical Exam    LABORATORY DATA:  I have reviewed the data as listed    Latest Ref Rng & Units 10/20/2023    9:46 AM 10/06/2023    9:20 AM 09/22/2023    9:10 AM  CBC  WBC 4.0 - 10.5 K/uL 8.2  5.5  5.9   Hemoglobin 12.0 - 15.0 g/dL 88.7  88.4  88.9   Hematocrit 36.0 - 46.0 % 34.6  35.6  33.8   Platelets 150 - 400 K/uL 256  267  270         Latest Ref Rng & Units 10/20/2023    9:46 AM 10/06/2023    9:20 AM 09/22/2023    9:10 AM  CMP  Glucose 70 - 99 mg/dL 890  882  882   BUN 8 - 23 mg/dL 8  12  7    Creatinine 0.44 - 1.00 mg/dL 9.44   9.45  9.51   Sodium 135 - 145 mmol/L 141  141  141   Potassium 3.5 - 5.1 mmol/L 3.6  3.7  3.3   Chloride 98 - 111 mmol/L 106  106  106   CO2 22 - 32 mmol/L 30  30  30    Calcium  8.9 - 10.3 mg/dL 9.1  9.3  9.1   Total Protein 6.5 - 8.1 g/dL 7.0  7.2  7.1   Total Bilirubin 0.0 - 1.2 mg/dL 0.3  0.3  0.3   Alkaline Phos 38 - 126 U/L 111  101  91   AST 15 -  41 U/L 15  15  15    ALT 0 - 44 U/L 15  16  17        RADIOGRAPHIC STUDIES: I have personally reviewed the radiological images as listed and agreed with the findings in the report. No results found.    No orders of the defined types were placed in this encounter.  All questions were answered. The patient knows to call the clinic with any problems, questions or concerns. No barriers to learning was detected. The total time spent in the appointment was 25 minutes, including review of chart and various tests results, discussions about plan of care and coordination of care plan     Onita Mattock, MD 10/20/2023

## 2023-10-22 ENCOUNTER — Inpatient Hospital Stay: Payer: Medicare (Managed Care)

## 2023-10-22 VITALS — BP 103/62 | HR 73 | Temp 98.5°F | Resp 18

## 2023-10-22 DIAGNOSIS — C221 Intrahepatic bile duct carcinoma: Secondary | ICD-10-CM

## 2023-10-22 DIAGNOSIS — Z95828 Presence of other vascular implants and grafts: Secondary | ICD-10-CM

## 2023-10-22 MED ORDER — SODIUM CHLORIDE 0.9% FLUSH
10.0000 mL | Freq: Once | INTRAVENOUS | Status: AC
  Administered 2023-10-22: 10 mL

## 2023-10-27 ENCOUNTER — Inpatient Hospital Stay: Payer: Medicare (Managed Care)

## 2023-10-27 DIAGNOSIS — Z5112 Encounter for antineoplastic immunotherapy: Secondary | ICD-10-CM | POA: Diagnosis not present

## 2023-10-27 DIAGNOSIS — Z95828 Presence of other vascular implants and grafts: Secondary | ICD-10-CM

## 2023-10-27 DIAGNOSIS — C221 Intrahepatic bile duct carcinoma: Secondary | ICD-10-CM

## 2023-10-27 DIAGNOSIS — C19 Malignant neoplasm of rectosigmoid junction: Secondary | ICD-10-CM | POA: Diagnosis not present

## 2023-10-27 MED ORDER — SODIUM CHLORIDE 0.9% FLUSH
10.0000 mL | Freq: Once | INTRAVENOUS | Status: AC
Start: 2023-10-27 — End: 2023-10-27
  Administered 2023-10-27: 10 mL

## 2023-11-03 ENCOUNTER — Inpatient Hospital Stay: Payer: Medicare (Managed Care)

## 2023-11-03 ENCOUNTER — Encounter: Payer: Self-pay | Admitting: Hematology

## 2023-11-03 ENCOUNTER — Inpatient Hospital Stay (HOSPITAL_BASED_OUTPATIENT_CLINIC_OR_DEPARTMENT_OTHER): Payer: Medicare (Managed Care) | Admitting: Hematology

## 2023-11-03 VITALS — BP 133/75 | HR 54 | Temp 97.3°F | Resp 17 | Ht 66.0 in | Wt 175.4 lb

## 2023-11-03 DIAGNOSIS — Z5112 Encounter for antineoplastic immunotherapy: Secondary | ICD-10-CM | POA: Diagnosis not present

## 2023-11-03 DIAGNOSIS — C221 Intrahepatic bile duct carcinoma: Secondary | ICD-10-CM

## 2023-11-03 DIAGNOSIS — C19 Malignant neoplasm of rectosigmoid junction: Secondary | ICD-10-CM | POA: Diagnosis not present

## 2023-11-03 DIAGNOSIS — C189 Malignant neoplasm of colon, unspecified: Secondary | ICD-10-CM

## 2023-11-03 DIAGNOSIS — Z95828 Presence of other vascular implants and grafts: Secondary | ICD-10-CM

## 2023-11-03 LAB — CMP (CANCER CENTER ONLY)
ALT: 23 U/L (ref 0–44)
AST: 18 U/L (ref 15–41)
Albumin: 3.9 g/dL (ref 3.5–5.0)
Alkaline Phosphatase: 128 U/L — ABNORMAL HIGH (ref 38–126)
Anion gap: 5 (ref 5–15)
BUN: 8 mg/dL (ref 8–23)
CO2: 30 mmol/L (ref 22–32)
Calcium: 9.3 mg/dL (ref 8.9–10.3)
Chloride: 105 mmol/L (ref 98–111)
Creatinine: 0.61 mg/dL (ref 0.44–1.00)
GFR, Estimated: >60 mL/min (ref >60)
Glucose, Bld: 117 mg/dL — ABNORMAL HIGH (ref 70–99)
Potassium: 3.8 mmol/L (ref 3.5–5.1)
Sodium: 140 mmol/L (ref 135–145)
Total Bilirubin: 0.3 mg/dL (ref 0.0–1.2)
Total Protein: 6.9 g/dL (ref 6.5–8.1)

## 2023-11-03 LAB — CBC WITH DIFFERENTIAL (CANCER CENTER ONLY)
Abs Immature Granulocytes: 0.01 K/uL (ref 0.00–0.07)
Basophils Absolute: 0 K/uL (ref 0.0–0.1)
Basophils Relative: 1 %
Eosinophils Absolute: 0.2 K/uL (ref 0.0–0.5)
Eosinophils Relative: 4 %
HCT: 36.2 % (ref 36.0–46.0)
Hemoglobin: 11.7 g/dL — ABNORMAL LOW (ref 12.0–15.0)
Immature Granulocytes: 0 %
Lymphocytes Relative: 20 %
Lymphs Abs: 1.1 K/uL (ref 0.7–4.0)
MCH: 28.8 pg (ref 26.0–34.0)
MCHC: 32.3 g/dL (ref 30.0–36.0)
MCV: 89.2 fL (ref 80.0–100.0)
Monocytes Absolute: 0.5 K/uL (ref 0.1–1.0)
Monocytes Relative: 9 %
Neutro Abs: 3.9 K/uL (ref 1.7–7.7)
Neutrophils Relative %: 66 %
Platelet Count: 262 K/uL (ref 150–400)
RBC: 4.06 MIL/uL (ref 3.87–5.11)
RDW: 17.2 % — ABNORMAL HIGH (ref 11.5–15.5)
WBC Count: 5.8 K/uL (ref 4.0–10.5)
nRBC: 0 % (ref 0.0–0.2)

## 2023-11-03 LAB — MAGNESIUM: Magnesium: 1.7 mg/dL (ref 1.7–2.4)

## 2023-11-03 LAB — TOTAL PROTEIN, URINE DIPSTICK: Protein, ur: NEGATIVE mg/dL

## 2023-11-03 LAB — CEA (ACCESS): CEA (CHCC): 42.9 ng/mL — ABNORMAL HIGH (ref 0.00–5.00)

## 2023-11-03 MED ORDER — SODIUM CHLORIDE 0.9 % IV SOLN
150.0000 mg/m2 | Freq: Once | INTRAVENOUS | Status: AC
  Administered 2023-11-03: 300 mg via INTRAVENOUS
  Filled 2023-11-03: qty 15

## 2023-11-03 MED ORDER — FAMOTIDINE IN NACL 20-0.9 MG/50ML-% IV SOLN
20.0000 mg | Freq: Once | INTRAVENOUS | Status: AC
  Administered 2023-11-03: 20 mg via INTRAVENOUS
  Filled 2023-11-03: qty 50

## 2023-11-03 MED ORDER — PALONOSETRON HCL INJECTION 0.25 MG/5ML
0.2500 mg | Freq: Once | INTRAVENOUS | Status: AC
  Administered 2023-11-03: 0.25 mg via INTRAVENOUS
  Filled 2023-11-03: qty 5

## 2023-11-03 MED ORDER — SODIUM CHLORIDE 0.9 % IV SOLN
400.0000 mg/m2 | Freq: Once | INTRAVENOUS | Status: AC
  Administered 2023-11-03: 768 mg via INTRAVENOUS
  Filled 2023-11-03: qty 38.4

## 2023-11-03 MED ORDER — DEXAMETHASONE SODIUM PHOSPHATE 10 MG/ML IJ SOLN
10.0000 mg | Freq: Once | INTRAMUSCULAR | Status: AC
  Administered 2023-11-03: 10 mg via INTRAVENOUS
  Filled 2023-11-03: qty 1

## 2023-11-03 MED ORDER — SODIUM CHLORIDE 0.9 % IV SOLN
2000.0000 mg/m2 | INTRAVENOUS | Status: DC
  Administered 2023-11-03: 3500 mg via INTRAVENOUS
  Filled 2023-11-03: qty 70

## 2023-11-03 MED ORDER — ATROPINE SULFATE 1 MG/ML IV SOLN
0.5000 mg | Freq: Once | INTRAVENOUS | Status: AC | PRN
  Administered 2023-11-03: 0.5 mg via INTRAVENOUS
  Filled 2023-11-03: qty 1

## 2023-11-03 MED ORDER — SODIUM CHLORIDE 0.9 % IV SOLN: INTRAVENOUS | Status: DC

## 2023-11-03 MED ORDER — SODIUM CHLORIDE 0.9 % IV SOLN
6.0000 mg/kg | Freq: Once | INTRAVENOUS | Status: AC
  Administered 2023-11-03: 500 mg via INTRAVENOUS
  Filled 2023-11-03: qty 5

## 2023-11-03 MED ORDER — SODIUM CHLORIDE 0.9% FLUSH
10.0000 mL | Freq: Once | INTRAVENOUS | Status: AC
  Administered 2023-11-03: 10 mL

## 2023-11-03 NOTE — Progress Notes (Signed)
 [facility]   Telephone:([phone] Fax:([phone]) [phone]   Clinic Follow up Note   Patient Care Team: [name], MD as PCP - General (Internal Medicine) [name], DO as PCP - Cardiology (Cardiology) [name], MD as Consulting Physician (Oncology) [name], MD as Consulting Physician (Gastroenterology)  Date of Service:  11/03/2023  CHIEF COMPLAINT: f/u of metastatic colon cancer  CURRENT THERAPY:  FOLFIRI and Vectibix  every 2 weeks  Oncology History   metastatic colon 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 Liver biopsy 01/08/20 confirmed metastasis from primary colorectal cancer. PET scan showed hypermetabolism to known liver mets, diffuse thoracic and abdominal lymphadenopathy, a 1.8 cm LUL pulmonary nodule, and splenic flexure of colon. -treated with first line FOLFOX 01/25/20 - 10/02/20, Vectibix  added with C2. Oxali discontinued after C18 due to reaction. -switched to Xeloda  10/21/20, dose adjusted due to significant skin toxicity -due to cancer progression, treatment has been changed to FOLFIRI and bevacizumab  on 03/25/22, she is overall tolerating well -will repeat CT scan after next cycle chemo  -Restaging CT scan from 06/16/2022 showed stable to mild decrease in size of treated liver metastasis. No new lesions -She is tolerating chemotherapy well overall, will continue -Her restaging CT scan on August 31, 2022 showed stable liver metastasis, and a stable 7 mm left lung nodule, indeterminate.  I personally reviewed the scan images with patient -Will continue current therapy.  We discussed the option of maintenance therapy with irinotecan  and bevacizumab  in future.  She is tolerating current FOLFIRI and bevacizumab  well, will continue. -Restaging CT 03/24/2023 showed stable disease.  -restaging CT in 06/2023 showed mild disease  progression in liver, I changed Beva to vectibix   - Restaging CT scan on September 27, 2023 showed stable disease, will continue current treatment.  Assessment & Plan Metastatic colon cancer Metastatic colon cancer is being managed with ongoing chemotherapy. She reports no new issues or side effects from the chemotherapy. Blood tests, including CBC, show anemia but are otherwise normal. Kidney and liver functions are normal. The current treatment regimen is effective. - Continue current chemotherapy regimen - Schedule neck scan for end of October or November  Constipation Constipation is not adequately managed with Metamucil. She finds relief with magnesium  taken every other day, which is effective in facilitating bowel movements. - Advise titration of magnesium  intake to daily or multiple times a day as needed for constipation relief  Anemia Mild anemia is present but not severe. Blood counts are stable and do not require immediate intervention.   Plan - Lab reviewed, adequate for treatment, will proceed to chemo today and continue every 2 weeks - Follow-up in 2 weeks  SUMMARY OF ONCOLOGIC HISTORY: Oncology History  metastatic colon cancer to liver  06/20/2019 Procedure   Colonoscopy by Dr Wilhelmenia 06/20/19  IMPRESSION -Seven 3 to 10 mm polyps in the sigmoid colon, in the transverse colon and in the escending colon, removed with a cold snare. Resected and retrireved.  -One 20mm polyp in the descending colon. Biopsies. Tattoes.  -Mediaum sized lipoma in the ascending colon.   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 See Comment B. Colon, Ascending, polyp, Polypectomy:  -TUBULAR ADENOMA -No high grade dysplasia or malignancy.  C. Colon, TRansverse, Polyo, polectomy:  -TUBULAR ADENOMA -No high grade dysplasia or malignancy.  D.  Colon, Sigmoid, Polyp, Polypectomy:  -HYPERPLASTIC POLYP   11/07/2019  Imaging   US  Abdomen 11/07/19  IMPRESSION: 1. Two solid masses in the liver are nonspecific. Recommend MRI abdomen with and without contrast for further evaluation.   12/15/2019 Imaging   MRI Abdomen 12/15/19  IMPRESSION: 1. There are two large masses in the liver with appearance favoring metastatic disease or hepatocellular carcinoma or cholangiocarcinoma. A benign etiology is highly unlikely given the enhancement pattern and associated adenopathy. 2. Considerable porta hepatis and retroperitoneal adenopathy. Some of the confluent porta hepatis tumor is potentially infiltrative and abuts the pancreatic body along its right upper margin, making it difficult to completely exclude the possibility of pancreatic adenocarcinoma primary. Possibilities helpful in further workup might include tissue diagnosis, endoscopic ultrasound, or nuclear medicine PET-CT. 3. Pancreas divisum. 4. Lumbar spondylosis and degenerative disc disease. 5. Despite efforts by the technologist and patient, motion artifact is present on today's exam and could not be eliminated. This reduces exam sensitivity and specificity.   12/22/2019 Procedure   Upper Endoscopy by Dr Rollin 12/22/19  IMPRESSION - One lymph node was visualized and measured in the porta hepatis region. Fine needle aspiration performed    12/22/2019 Initial Biopsy   A. LIVER, PORTA HEPATIS MASS, FINE NEEDLE 12/22/19 ASPIRATION:  FINAL MICROSCOPIC DIAGNOSIS:  - Malignant cells consistent with metastatic adenocarcinoma   01/01/2020 Initial Diagnosis   Intrahepatic cholangiocarcinoma (HCC)   01/08/2020 Initial Biopsy   FINAL MICROSCOPIC DIAGNOSIS:   A. LIVER, LEFT LOBE, BIOPSY:  - Metastatic adenocarcinoma, consistent with a colorectal primary.  See  comment      COMMENT:   Immunohistochemical stains show the tumor cells are positive for CK20  and CDX2 but negative for CK7, consistent with above interpretation.  Dr. Lanny was notified  on 01/10/2020   01/08/2020 Genetic Testing   [name]  [name] wildtype and [name]/[name] mutations which make her eligible for target biological agent Vectibix .   01/24/2020 Procedure   PAC placed 01/24/20   01/25/2020 -  Chemotherapy   first line FOLFOX starting 01/25/2020, Vextibix  added with C2 (02/06/20)   02/06/2020 - 02/06/2022 Chemotherapy   Patient is on Treatment Plan : COLORECTAL Panitumumab  q14d ([name] Wild - Type Gene Only)     06/20/2020 Imaging   CT CAP  IMPRESSION: 1. Continued interval reduction in size and conspicuity of a subsolid nodule of the peripheral left upper lobe. 2. Unchanged prominent pretracheal and subcarinal lymph nodes. 3. Redemonstrated partially calcified low-attenuation liver masses, slightly decreased in size compared to prior examination. 4. Slight interval decrease in size of a portacaval lymph node or conglomerate and retroperitoneal lymph nodes. 5. Findings are consistent with continued treatment response of nodal, pulmonary, and hepatic metastatic disease. 6. Coronary artery disease.   Aortic Atherosclerosis (ICD10-I70.0).     10/09/2020 Imaging   IMPRESSION: 1. Slight decrease in size of dominant liver mass with smaller liver mass within 1-2 mm of prior measurement. 2. Stable appearance of celiac lymph and retroperitoneal lymph nodes. Dominant node with calcification in the gastrohepatic ligament as described. 3. Continued decrease in size of LEFT upper lobe nodule. 4. Three-vessel coronary artery calcification. 5. Aortic atherosclerosis.   03/25/2022 - 07/16/2023 Chemotherapy   Patient is on Treatment Plan : COLORECTAL FOLFIRI + Bevacizumab  q14d     08/31/2022 Imaging    IMPRESSION: 1. Unchanged, densely calcified liver lesions, consistent with treated metastatic disease. 2. Unchanged, irregular subpleural opacity of the peripheral left upper lobe. 3. Unchanged subcentimeter gastrohepatic ligament lymph  node. 4. No evidence of new  metastatic disease in the chest, abdomen, or pelvis. 5. Status post hysterectomy. 6. Coronary artery disease. 7. Aortic valve calcifications. Correlate for echocardiographic evidence of aortic valve dysfunction.     06/22/2023 Imaging   CT Chest, Abdomen, and pelvis with contrast  IMPRESSION: Centrally calcified liver lesions are again seen and slightly increased today. Prominent nodes in the upper retroperitoneum are overall similar. Attention on follow-up.   Stable branching left upper lobe lung nodule.   08/11/2023 -  Chemotherapy   Patient is on Treatment Plan : COLORECTAL FOLFIRI + Panitumumab  q14d        Discussed the use of AI scribe software for clinical note transcription with the patient, who gave verbal consent to proceed.  History of Present Illness Lindsay [name] is a 68 year old female with metastatic colon cancer who presents for follow-up.  She experiences constipation as Metamucil is no longer effective and uses magnesium  every other day, which aids in bowel movements. There is no bleeding or significant rash from chemotherapy. She is not taking oral magnesium  tablets.     All other systems were reviewed with the patient and are negative.  MEDICAL HISTORY:  Past Medical History:  Diagnosis Date   Arthritis    Diabetes mellitus without complication (HCC)    Hypertension    met colon ca to liver 01/2020    SURGICAL HISTORY: Past Surgical History:  Procedure Laterality Date   ABDOMINAL HYSTERECTOMY     ANTERIOR AND POSTERIOR REPAIR WITH SACROSPINOUS FIXATION N/A 07/16/2016   Procedure: ANTERIOR AND POSTERIOR REPAIR WITH SACROSPINOUS FIXATION;  Surgeon: Curlene Agent, MD;  Location: [facility];  Service: Gynecology;  Laterality: N/A;   CYSTOSCOPY  07/16/2016   Procedure: CYSTOSCOPY;  Surgeon: Curlene Agent, MD;  Location: [facility];  Service: Gynecology;;   ESOPHAGOGASTRODUODENOSCOPY (EGD) WITH PROPOFOL  N/A 12/22/2019   Procedure: ESOPHAGOGASTRODUODENOSCOPY (EGD)  WITH PROPOFOL ;  Surgeon: [name], MD;  Location: [facility];  Service: Endoscopy;  Laterality: N/A;   FINE NEEDLE ASPIRATION N/A 12/22/2019   Procedure: FINE NEEDLE ASPIRATION (FNA) LINEAR;  Surgeon: [name], MD;  Location: [facility];  Service: Endoscopy;  Laterality: N/A;   IR IMAGING GUIDED PORT INSERTION  01/24/2020   IR REMOVAL TUN ACCESS W/ PORT W/O FL MOD SED  07/30/2023   LAPAROSCOPIC VAGINAL HYSTERECTOMY WITH SALPINGECTOMY Bilateral 07/16/2016   Procedure: LAPAROSCOPIC ASSISTED VAGINAL HYSTERECTOMY WITH SALPINGECTOMY;  Surgeon: Curlene Agent, MD;  Location: [facility];  Service: Gynecology;  Laterality: Bilateral;   LEFT HEART CATH AND CORONARY ANGIOGRAPHY N/A 05/07/2023   Procedure: LEFT HEART CATH AND CORONARY ANGIOGRAPHY;  Surgeon: Anner Alm ORN, MD;  Location: [facility];  Service: Cardiovascular;  Laterality: N/A;   MYOMECTOMY ABDOMINAL APPROACH     THYROID SURGERY     tyroid     UPPER ESOPHAGEAL ENDOSCOPIC ULTRASOUND (EUS) N/A 12/22/2019   Procedure: UPPER ESOPHAGEAL ENDOSCOPIC ULTRASOUND (EUS);  Surgeon: [name], MD;  Location: [facility];  Service: Endoscopy;  Laterality: N/A;    I have reviewed the social history and family history with the patient and they are unchanged from previous note.  ALLERGIES:  is allergic to oxaliplatin .  MEDICATIONS:  Current Outpatient Medications  Medication Sig Dispense Refill   aspirin  EC 81 MG tablet Take 1 tablet (81 mg total) by mouth daily. Swallow whole. 90 tablet 0   atorvastatin  (LIPITOR) 80 MG tablet Take 1 tablet (80 mg total) by mouth daily. 90 tablet 0   clindamycin  (  CLEOCIN -T) 1 % lotion Apply topically as needed. 60 mL 0   clopidogrel  (PLAVIX ) 75 MG tablet Take 1 tablet (75 mg total) by mouth daily. 90 tablet 0   doxycycline  (VIBRA -TABS) 100 MG tablet Take 1 tablet (100 mg total) by mouth 2 (two) times daily. 60 tablet 3   metoprolol  succinate (TOPROL  XL) 25 MG 24 hr tablet Take 1 tablet (25 mg  total) by mouth daily. Hold if systolic BP (top number) is less than 100 and/or heart rate is less than 55 90 tablet 3   nitroGLYCERIN  (NITROSTAT ) 0.4 MG SL tablet Place 1 tablet (0.4 mg total) under the tongue every 5 (five) minutes as needed for chest pain. 75 tablet 3   ranolazine  (RANEXA ) 500 MG 12 hr tablet Take 1 tablet (500 mg total) by mouth 2 (two) times daily. 180 tablet 3   No current facility-administered medications for this visit.   Facility-Administered Medications Ordered in Other Visits  Medication Dose Route Frequency Provider Last Rate Last Admin   0.9 %  sodium chloride  infusion   Intravenous Continuous [name], MD   Stopped at 11/03/23 1438   fluorouracil  (ADRUCIL ) 3,500 mg in sodium chloride  0.9 % 80 mL chemo infusion  2,000 mg/m2 (Treatment Plan Recorded) Intravenous 1 day or 1 dose [name], MD   Infusion Verify at 11/03/23 1445    PHYSICAL EXAMINATION: ECOG PERFORMANCE STATUS: 1 - Symptomatic but completely ambulatory  Vitals:   11/03/23 1045  BP: 133/75  Pulse: (!) 54  Resp: 17  Temp: (!) 97.3 F (36.3 C)  SpO2: 100%   Wt Readings from Last 3 Encounters:  11/03/23 175 lb 6.4 oz (79.6 kg)  10/20/23 173 lb 1.6 oz (78.5 kg)  10/06/23 171 lb 4.8 oz (77.7 kg)     GENERAL:alert, no distress and comfortable SKIN: skin color, texture, turgor are normal, no rashes or significant lesions EYES: normal, Conjunctiva are pink and non-injected, sclera clear NECK: supple, thyroid normal size, non-tender, without nodularity LYMPH:  no palpable lymphadenopathy in the cervical, axillary  LUNGS: clear to auscultation and percussion with normal breathing effort HEART: regular rate & rhythm and no murmurs and no lower extremity edema ABDOMEN:abdomen soft, non-tender and normal bowel sounds Musculoskeletal:no cyanosis of digits and no clubbing  NEURO: alert & oriented x 3 with fluent speech, no focal motor/sensory deficits  Physical Exam    LABORATORY DATA:  I have  reviewed the data as listed    Latest Ref Rng & Units 11/03/2023    9:50 AM 10/20/2023    9:46 AM 10/06/2023    9:20 AM  CBC  WBC 4.0 - 10.5 K/uL 5.8  8.2  5.5   Hemoglobin 12.0 - 15.0 g/dL 88.2  88.7  88.4   Hematocrit 36.0 - 46.0 % 36.2  34.6  35.6   Platelets 150 - 400 K/uL 262  256  267         Latest Ref Rng & Units 11/03/2023    9:50 AM 10/20/2023    9:46 AM 10/06/2023    9:20 AM  CMP  Glucose 70 - 99 mg/dL 882  890  882   BUN 8 - 23 mg/dL 8  8  12    Creatinine 0.44 - 1.00 mg/dL 9.38  9.44  9.45   Sodium 135 - 145 mmol/L 140  141  141   Potassium 3.5 - 5.1 mmol/L 3.8  3.6  3.7   Chloride 98 - 111 mmol/L 105  106  106  CO2 22 - 32 mmol/L 30  30  30    Calcium  8.9 - 10.3 mg/dL 9.3  9.1  9.3   Total Protein 6.5 - 8.1 g/dL 6.9  7.0  7.2   Total Bilirubin 0.0 - 1.2 mg/dL 0.3  0.3  0.3   Alkaline Phos 38 - 126 U/L 128  111  101   AST 15 - 41 U/L 18  15  15    ALT 0 - 44 U/L 23  15  16        RADIOGRAPHIC STUDIES: I have personally reviewed the radiological images as listed and agreed with the findings in the report. No results found.    No orders of the defined types were placed in this encounter.  All questions were answered. The patient knows to call the clinic with any problems, questions or concerns. No barriers to learning was detected. The total time spent in the appointment was 25 minutes, including review of chart and various tests results, discussions about plan of care and coordination of care plan     Onita Mattock, MD 11/03/2023

## 2023-11-03 NOTE — Patient Instructions (Signed)
 [facility] - A DEPT OF [facility]  Discharge Instructions: Thank you for choosing [facility] to provide your oncology and hematology care.   If you have a lab appointment with the Cancer Center, please go directly to the Cancer Center and check in at the registration area.   Wear comfortable clothing and clothing appropriate for easy access to any Portacath or PICC line.   We strive to give you quality time with your provider. You may need to reschedule your appointment if you arrive late (15 or more minutes).  Arriving late affects you and other patients whose appointments are after yours.  Also, if you miss three or more appointments without notifying the office, you may be dismissed from the clinic at the provider's discretion.      For prescription refill requests, have your pharmacy contact our office and allow 72 hours for refills to be completed.    Today you received the following chemotherapy and/or immunotherapy agents: Vectibix , Irinotecan , Leucovorin , 5FU      To help prevent nausea and vomiting after your treatment, we encourage you to take your nausea medication as directed.  BELOW ARE SYMPTOMS THAT SHOULD BE REPORTED IMMEDIATELY: *FEVER GREATER THAN 100.4 F (38 C) OR HIGHER *CHILLS OR SWEATING *NAUSEA AND VOMITING THAT IS NOT CONTROLLED WITH YOUR NAUSEA MEDICATION *UNUSUAL SHORTNESS OF BREATH *UNUSUAL BRUISING OR BLEEDING *URINARY PROBLEMS (pain or burning when urinating, or frequent urination) *BOWEL PROBLEMS (unusual diarrhea, constipation, pain near the anus) TENDERNESS IN MOUTH AND THROAT WITH OR WITHOUT PRESENCE OF ULCERS (sore throat, sores in mouth, or a toothache) UNUSUAL RASH, SWELLING OR PAIN  UNUSUAL VAGINAL DISCHARGE OR ITCHING   Items with * indicate a potential emergency and should be followed up as soon as possible or go to the Emergency Department if any problems should occur.  Please show the CHEMOTHERAPY  ALERT CARD or IMMUNOTHERAPY ALERT CARD at check-in to the Emergency Department and triage nurse.  Should you have questions after your visit or need to cancel or reschedule your appointment, please contact [facility] - A DEPT OF [facility]  Dept: [phone]  and follow the prompts.  Office hours are 8:00 a.m. to 4:30 p.m. Monday - Friday. Please note that voicemails left after 4:00 p.m. may not be returned until the following business day.  We are closed weekends and major holidays. You have access to a nurse at all times for urgent questions. Please call the main number to the clinic Dept: [phone] and follow the prompts.   For any non-urgent questions, you may also contact your provider using MyChart. We now offer e-Visits for anyone 35 and older to request care online for non-urgent symptoms. For details visit [email or web address].   Also download the MyChart app! Go to the app store, search "MyChart", open the app, select [place], and log in with your MyChart username and password.

## 2023-11-03 NOTE — Assessment & Plan Note (Signed)
 MSS, [name]/[name] wildtype -diagnosed 12/2019 by porta hepatis LN biopsy during EUS for work up of abdominal pain and liver lesions seen on US  and MRI. Liver biopsy 01/08/20 confirmed metastasis from primary colorectal cancer. PET scan showed hypermetabolism to known liver mets, diffuse thoracic and abdominal lymphadenopathy, a 1.8 cm LUL pulmonary nodule, and splenic flexure of colon. -treated with first line FOLFOX 01/25/20 - 10/02/20, Vectibix  added with C2. Oxali discontinued after C18 due to reaction. -switched to Xeloda  10/21/20, dose adjusted due to significant skin toxicity -due to cancer progression, treatment has been changed to FOLFIRI and bevacizumab  on 03/25/22, she is overall tolerating well -will repeat CT scan after next cycle chemo  -Restaging CT scan from 06/16/2022 showed stable to mild decrease in size of treated liver metastasis. No new lesions -She is tolerating chemotherapy well overall, will continue -Her restaging CT scan on August 31, 2022 showed stable liver metastasis, and a stable 7 mm left lung nodule, indeterminate.  I personally reviewed the scan images with patient -Will continue current therapy.  We discussed the option of maintenance therapy with irinotecan  and bevacizumab  in future.  She is tolerating current FOLFIRI and bevacizumab  well, will continue. -Restaging CT 03/24/2023 showed stable disease.  -restaging CT in 06/2023 showed mild disease progression in liver, I changed Beva to vectibix   - Restaging CT scan on September 27, 2023 showed stable disease, will continue current treatment.

## 2023-11-05 ENCOUNTER — Inpatient Hospital Stay: Payer: Medicare (Managed Care)

## 2023-11-05 VITALS — BP 121/72 | HR 62 | Temp 98.0°F | Resp 18

## 2023-11-05 DIAGNOSIS — C221 Intrahepatic bile duct carcinoma: Secondary | ICD-10-CM

## 2023-11-05 DIAGNOSIS — Z95828 Presence of other vascular implants and grafts: Secondary | ICD-10-CM

## 2023-11-05 MED ORDER — SODIUM CHLORIDE 0.9% FLUSH
10.0000 mL | Freq: Once | INTRAVENOUS | Status: AC
  Administered 2023-11-05: 10 mL

## 2023-11-10 ENCOUNTER — Inpatient Hospital Stay: Payer: Medicare (Managed Care) | Attending: Hematology

## 2023-11-10 DIAGNOSIS — Z90710 Acquired absence of both cervix and uterus: Secondary | ICD-10-CM | POA: Insufficient documentation

## 2023-11-10 DIAGNOSIS — Z79899 Other long term (current) drug therapy: Secondary | ICD-10-CM | POA: Insufficient documentation

## 2023-11-10 DIAGNOSIS — C787 Secondary malignant neoplasm of liver and intrahepatic bile duct: Secondary | ICD-10-CM | POA: Insufficient documentation

## 2023-11-10 DIAGNOSIS — Z5112 Encounter for antineoplastic immunotherapy: Secondary | ICD-10-CM | POA: Insufficient documentation

## 2023-11-10 DIAGNOSIS — C19 Malignant neoplasm of rectosigmoid junction: Secondary | ICD-10-CM | POA: Insufficient documentation

## 2023-11-10 DIAGNOSIS — C221 Intrahepatic bile duct carcinoma: Secondary | ICD-10-CM

## 2023-11-10 DIAGNOSIS — Z95828 Presence of other vascular implants and grafts: Secondary | ICD-10-CM

## 2023-11-10 DIAGNOSIS — C7800 Secondary malignant neoplasm of unspecified lung: Secondary | ICD-10-CM | POA: Insufficient documentation

## 2023-11-10 DIAGNOSIS — E876 Hypokalemia: Secondary | ICD-10-CM | POA: Insufficient documentation

## 2023-11-10 DIAGNOSIS — Z452 Encounter for adjustment and management of vascular access device: Secondary | ICD-10-CM | POA: Insufficient documentation

## 2023-11-10 DIAGNOSIS — Z5111 Encounter for antineoplastic chemotherapy: Secondary | ICD-10-CM | POA: Insufficient documentation

## 2023-11-10 MED ORDER — SODIUM CHLORIDE 0.9% FLUSH
10.0000 mL | Freq: Once | INTRAVENOUS | Status: AC
  Administered 2023-11-10: 10 mL

## 2023-11-16 ENCOUNTER — Ambulatory Visit: Payer: Medicare (Managed Care) | Attending: Cardiovascular Disease | Admitting: Cardiology

## 2023-11-16 ENCOUNTER — Encounter: Payer: Self-pay | Admitting: Cardiology

## 2023-11-16 VITALS — BP 145/80 | HR 61 | Resp 16 | Ht 66.0 in | Wt 175.8 lb

## 2023-11-16 DIAGNOSIS — I6523 Occlusion and stenosis of bilateral carotid arteries: Secondary | ICD-10-CM

## 2023-11-16 DIAGNOSIS — I25118 Atherosclerotic heart disease of native coronary artery with other forms of angina pectoris: Secondary | ICD-10-CM

## 2023-11-16 DIAGNOSIS — I10 Essential (primary) hypertension: Secondary | ICD-10-CM | POA: Diagnosis not present

## 2023-11-16 DIAGNOSIS — E782 Mixed hyperlipidemia: Secondary | ICD-10-CM

## 2023-11-16 DIAGNOSIS — E119 Type 2 diabetes mellitus without complications: Secondary | ICD-10-CM

## 2023-11-16 DIAGNOSIS — I350 Nonrheumatic aortic (valve) stenosis: Secondary | ICD-10-CM | POA: Diagnosis not present

## 2023-11-16 MED ORDER — EZETIMIBE 10 MG PO TABS: 10.0000 mg | ORAL_TABLET | Freq: Every day | ORAL | 3 refills | Status: AC

## 2023-11-16 MED ORDER — ISOSORBIDE MONONITRATE ER 30 MG PO TB24: 30.0000 mg | ORAL_TABLET | Freq: Every evening | ORAL | 3 refills | Status: AC

## 2023-11-16 NOTE — Patient Instructions (Signed)
 Medication Instructions:  START Zetia  10 mg daily  START Imdur  30 mg every evening *If you need a refill on your cardiac medications before your next appointment, please call your pharmacy*  Lab Work In 6 Weeks: FASTING LIPIDS CMP If you have labs (blood work) drawn today and your tests are completely normal, you will receive your results only by: MyChart Message (if you have MyChart) OR A paper copy in the mail If you have any lab test that is abnormal or we need to change your treatment, we will call you to review the results.  Testing/Procedures: NONE  Follow-Up: At [facility], you and your health needs are our priority.  As part of our continuing mission to provide you with exceptional heart care, our providers are all part of one team.  This team includes your primary Cardiologist (physician) and Advanced Practice Providers or APPs (Physician Assistants and Nurse Practitioners) who all work together to provide you with the care you need, when you need it.  Your next appointment:   April 2026  Provider:   Madonna Large, DO    We recommend signing up for the patient portal called MyChart.  Sign up information is provided on this After Visit Summary.  MyChart is used to connect with patients for Virtual Visits (Telemedicine).  Patients are able to view lab/test results, encounter notes, upcoming appointments, etc.  Non-urgent messages can be sent to your provider as well.   To learn more about what you can do with MyChart, go to [email or web address].

## 2023-11-16 NOTE — Progress Notes (Signed)
 Cardiology Office Note:  .   Date:  11/16/2023  ID:  Lindsay [name], DOB July 04, 1955, MRN [id number] PCP:  [name], MD  Former Cardiology Providers: Dr. Ladona, Dr. Alvan.   HeartCare Providers Cardiologist:  Madonna Large, DO , [facility] (established care 08/13/23) Electrophysiologist:  None  Click to update primary MD,subspecialty MD or APP then REFRESH:1}    Chief Complaint  Patient presents with   Follow-up    Re-evaluation of chest pain.     History of Present Illness: .   Lindsay [name] is a 68 y.o. African-American female whose past medical history and cardiovascular risk factors includes: Metastatic colon cancer, hypertension, diabetes, former smoker (17-pack-year history, quit in 2020), coronary calcification, coronary artery disease, questionable bicuspid aortic valve, aortic stenosis, advanced age  She was hospitalized in February 2025 and ruled in for NSTEMI.  Echocardiogram noted preserved LVEF, grade 2 diastolic dysfunction, moderate to severe aortic stenosis.  She underwent left heart catheterization results reviewed and noted below but essentially recommended medical therapy.  In June 2025, she is was undergoing chemotherapy for her colon cancer with metastasis to liver.  Patient was referred to the practice on an urgent basis after experiencing chest pain during her chemotherapy.  At that time she was noted to be hypertensive & EKG and noted sinus rhythm with LVH/repolarization abnormality.  Her precordial discomfort at that time was felt to be noncardiac and likely precipitated by uncontrolled hypertension and underlying LVH along with known CAD which was recommended to be treated medically.  Her antianginal therapy was uptitrated by increasing her Lopressor  12.5 mg twice daily to Toprol -XL 25 mg p.o. daily and starting Ranexa  500 mg p.o. twice daily.  In addition, given her aortic stenosis she had a repeat echocardiogram in July 2025 which notes preserved LVEF and  findings to suggest moderate to severe aortic stenosis.    Patient presents today for follow-up.  Since last office visit patient states that her precordial discomfort is better controlled.  She does have episodes of indigestion which she attributes to chest pain and therefore has been taking sublingual nitroglycerin  tablets several times a day which provide her relief.  She has not been evaluated for GERD/acid reflux formally.  Patient states that the Tums do not work for her.  Has not been on medication such as Protonix , Nexium , Pepcid , etc. she denies heart failure symptoms, near-syncope or syncopal events  Review of Systems: .   Review of Systems  Cardiovascular:  Positive for chest pain (see HPI, chronic and stable). Negative for claudication, irregular heartbeat, leg swelling, near-syncope, orthopnea, palpitations, paroxysmal nocturnal dyspnea and syncope.  Respiratory:  Negative for shortness of breath.   Hematologic/Lymphatic: Negative for bleeding problem.  Gastrointestinal:  Positive for heartburn.    Studies Reviewed:   Echocardiogram: 04/2023:  LVEF 55 to 60%.  Grade II diastolic dysfunction (pseudonormalization). Moderate to severe aortic valve stenosis. Aortic valve area, by VTI measures 0.85 cm. Aortic valve mean gradient measures 20.0 mmHg. Aortic valve Vmax measures 3.07 m/s. Aortic valve acceleration time measures 105 msec.  09/23/2023  1. Left ventricular ejection fraction, by estimation, is 60 to 65%. The left ventricle has normal function. The left ventricle has no regional  wall motion abnormalities. Left ventricular diastolic parameters are consistent with Grade II diastolic dysfunction (pseudonormalization). The average left ventricular global longitudinal strain is -18.1 %. The global longitudinal strain is normal.   2. Right ventricular systolic function is normal. The right ventricular size is normal. There is  normal pulmonary artery systolic pressure.   3. Left  atrial size was moderately dilated.   4. The mitral valve is normal in structure. Trivial mitral valve regurgitation. No evidence of mitral stenosis.   5. The aortic valve is calcified. There is severe calcifcation of the aortic valve. Aortic valve regurgitation is not visualized. Moderate to severe aortic valve stenosis. Aortic valve area, by VTI measures 0.80 cm. Aortic valve mean gradient measures 21.0 mmHg. Aortic valve Vmax measures 3.00 m/s.  Dimensional index 0.28, stroke-volume indexed 32  6. The inferior vena cava is normal in size with greater than 50% respiratory variability, suggesting right atrial pressure of 3 mmHg.   Comparison(s): No significant change from prior study. Stable moderate to severe aortic stenosis.   Heart catheterization: February 2025   Ramus-1 lesion is 90% stenosed.   Ramus-2 lesion is 80% stenosed.   Prox RCA lesion is 75% stenosed.   Mid RCA lesion is 50% stenosed.   Prox LAD to Mid LAD lesion is 40% stenosed.   LV end diastolic pressure is mildly elevated.   There is (mild-) moderate aortic valve stenosis.   Dominance: Right  Severe two-vessel disease with diffuse disease small vessels throughout colon likely culprit is 90% followed by 80% lesions and a 1.5 mm high Diag/Ramus, along with a focal 75% stenosis and a very small caliber RCA followed by segmental 50% stenosis. Mild diffuse disease in the proximal LAD -Lesions are not amenable to PCI based on the small caliber vessels. Best suited for medical management.  Relative normal LVEDP.      RECOMMENDATIONS   Anticipated discharge date to be determined.  She will be returned to [facility] for ongoing care   Recommend uninterrupted dual antiplatelet therapy with Aspirin  81mg  daily and Clopidogrel  75mg  daily for a minimum of 12 months (ACS-Class I recommendation). Would be okay to simply do Plavix  monotherapy after first 3 months given the ACS presentation.   Continue to titrate GDMT for CAD.        Alm Clay, MD  Carotid Duplex  09/23/2023 Right Carotid: Velocities in the right ICA are consistent with a 1-39% stenosis.  Left Carotid: Velocities in the left ICA are consistent with a 1-39% stenosis.   Vertebrals: Bilateral vertebral arteries demonstrate antegrade flow.  Subclavians: Normal flow hemodynamics were seen in bilateral subclavian arteries.   RADIOLOGY: NA  Risk Assessment/Calculations:   NA   Labs:       Latest Ref Rng & Units 11/03/2023    9:50 AM 10/20/2023    9:46 AM 10/06/2023    9:20 AM  CBC  WBC 4.0 - 10.5 K/uL 5.8  8.2  5.5   Hemoglobin 12.0 - 15.0 g/dL 88.2  88.7  88.4   Hematocrit 36.0 - 46.0 % 36.2  34.6  35.6   Platelets 150 - 400 K/uL 262  256  267        Latest Ref Rng & Units 11/03/2023    9:50 AM 10/20/2023    9:46 AM 10/06/2023    9:20 AM  BMP  Glucose 70 - 99 mg/dL 882  890  882   BUN 8 - 23 mg/dL 8  8  12    Creatinine 0.44 - 1.00 mg/dL 9.38  9.44  9.45   Sodium 135 - 145 mmol/L 140  141  141   Potassium 3.5 - 5.1 mmol/L 3.8  3.6  3.7   Chloride 98 - 111 mmol/L 105  106  106   CO2 22 -  32 mmol/L 30  30  30    Calcium  8.9 - 10.3 mg/dL 9.3  9.1  9.3       Latest Ref Rng & Units 11/03/2023    9:50 AM 10/20/2023    9:46 AM 10/06/2023    9:20 AM  CMP  Glucose 70 - 99 mg/dL 882  890  882   BUN 8 - 23 mg/dL 8  8  12    Creatinine 0.44 - 1.00 mg/dL 9.38  9.44  9.45   Sodium 135 - 145 mmol/L 140  141  141   Potassium 3.5 - 5.1 mmol/L 3.8  3.6  3.7   Chloride 98 - 111 mmol/L 105  106  106   CO2 22 - 32 mmol/L 30  30  30    Calcium  8.9 - 10.3 mg/dL 9.3  9.1  9.3   Total Protein 6.5 - 8.1 g/dL 6.9  7.0  7.2   Total Bilirubin 0.0 - 1.2 mg/dL 0.3  0.3  0.3   Alkaline Phos 38 - 126 U/L 128  111  101   AST 15 - 41 U/L 18  15  15    ALT 0 - 44 U/L 23  15  16       Lab Results  Component Value Date   CHOL 130 07/09/2023   HDL 46 07/09/2023   LDLCALC 68 07/09/2023   TRIG 84 07/09/2023   CHOLHDL 2.8 07/09/2023   Recent Labs     05/08/23 0640  LIPOA 210.7*   No components found for: NTPROBNP No results for input(s): PROBNP in the last 8760 hours. No results for input(s): TSH in the last 8760 hours.   Physical Exam:  Today's Vitals   11/16/23 0912  BP: (!) 145/80  Pulse: 61  Resp: 16  SpO2: 97%  Weight: 175 lb 12.8 oz (79.7 kg)  Height: 5' 6 (1.676 m)   Body mass index is 28.37 kg/m. Wt Readings from Last 3 Encounters:  11/16/23 175 lb 12.8 oz (79.7 kg)  11/03/23 175 lb 6.4 oz (79.6 kg)  10/20/23 173 lb 1.6 oz (78.5 kg)    Physical Exam  Constitutional: No distress.  hemodynamically stable  Neck: No JVD present.  Cardiovascular: Normal rate, regular rhythm, S1 normal and S2 normal. Exam reveals no gallop, no S3 and no S4.  Murmur heard. Midsystolic murmur is present with a grade of 3/6 at the upper right sternal border radiating to the neck. Pulses:      Carotid pulses are  on the right side with bruit and  on the left side with bruit. Pulmonary/Chest: Effort normal and breath sounds normal. No stridor. She has no wheezes. She has no rales.  Recent port removal  Musculoskeletal:        General: No edema.     Cervical back: Neck supple.  Skin: Skin is warm.     Impression:   ICD-10-CM   1. Coronary artery disease of native artery of native heart with stable angina pectoris (HCC)  I25.118     2. Nonrheumatic aortic valve stenosis  I35.0     3. Atherosclerosis of both carotid arteries  I65.23     4. Essential hypertension  I10     5. Type 2 diabetes mellitus without complication, without long-term current use of insulin (HCC)  E11.9     6. Mixed hyperlipidemia  E78.2        Recommendation(s):  Coronary artery disease of native artery of native heart with stable angina pectoris (HCC)  Symptoms consistent with stable angina. Has tolerated Ranexa  500 mg p.o. twice daily well without side effects or intolerances. Has been requiring sublingual nitroglycerin  tablet predominately for  heartburn-like symptoms and not chest pain. Recommended that she follows up with PCP and get a formal evaluation for dyspepsia. Antianginal therapy: Toprol -XL: Sublingual nitroglycerin  tablet, Ranexa  Will add Imdur  30 mg p.o. every afternoon Continue dual antiplatelet therapy as recommended by interventional cardiology, for 1 year duration Continue Lipitor 80 mg p.o. nightly. Most recent labs from May 2025 illustrate LDL at 68 mg/dL. Start Zetia  10 mg p.o. daily with fasting lipids and CMP in 6 weeks  Nonrheumatic aortic valve stenosis Hemodynamics suggestive of paradoxical low-flow low gradient AS Clinically asymptomatic. Echocardiogram from July 2025 reviewed-hemodynamics are reported above Patient is asked to seek medical attention if she has heart failure symptoms, syncope, or anginal chest pain more intense than her stable symptoms Follow-up echocardiogram in 6 months to reevaluate disease progression  Atherosclerosis of both carotid arteries Bilateral stenosis <40% Continue antiplatelets and lipid-lowering agents  Essential hypertension Office blood pressures are acceptable, not at goal Medications as discussed above  Type 2 diabetes mellitus without complication, without long-term current use of insulin (HCC) Reemphasize importance of glycemic control Cardiology following peripherally, managed by primary care provider.  Mixed hyperlipidemia Continue Lipitor 80 mg p.o. nightly. Start Zetia  10 mg p.o. daily. Fasting lipids in [redacted] weeks along with CMP.  Orders Placed:  No orders of the defined types were placed in this encounter.  Final Medication List:    No orders of the defined types were placed in this encounter.   There are no discontinued medications.    Current Outpatient Medications:    aspirin  EC 81 MG tablet, Take 1 tablet (81 mg total) by mouth daily. Swallow whole., Disp: 90 tablet, Rfl: 0   atorvastatin  (LIPITOR) 80 MG tablet, Take 1 tablet (80 mg total) by  mouth daily., Disp: 90 tablet, Rfl: 0   clindamycin  (CLEOCIN -T) 1 % lotion, Apply topically as needed., Disp: 60 mL, Rfl: 0   clopidogrel  (PLAVIX ) 75 MG tablet, Take 1 tablet (75 mg total) by mouth daily., Disp: 90 tablet, Rfl: 0   doxycycline  (VIBRA -TABS) 100 MG tablet, Take 1 tablet (100 mg total) by mouth 2 (two) times daily., Disp: 60 tablet, Rfl: 3   nitroGLYCERIN  (NITROSTAT ) 0.4 MG SL tablet, Place 1 tablet (0.4 mg total) under the tongue every 5 (five) minutes as needed for chest pain., Disp: 75 tablet, Rfl: 3   ranolazine  (RANEXA ) 500 MG 12 hr tablet, Take 1 tablet (500 mg total) by mouth 2 (two) times daily., Disp: 180 tablet, Rfl: 3   metoprolol  succinate (TOPROL  XL) 25 MG 24 hr tablet, Take 1 tablet (25 mg total) by mouth daily. Hold if systolic BP (top number) is less than 100 and/or heart rate is less than 55 (Patient not taking: Reported on 11/16/2023), Disp: 90 tablet, Rfl: 3  Consent:   NA  Disposition:   April 2026 after echocardiogram in March  Her questions and concerns were addressed to her satisfaction. She voices understanding of the recommendations provided during this encounter.    Signed, Madonna Michele HAS, [facility]  A Division of [facility] [address] [place], [place] [address]  11/16/2023 10:15 AM

## 2023-11-17 ENCOUNTER — Inpatient Hospital Stay: Payer: Medicare (Managed Care)

## 2023-11-17 ENCOUNTER — Inpatient Hospital Stay (HOSPITAL_BASED_OUTPATIENT_CLINIC_OR_DEPARTMENT_OTHER): Payer: Medicare (Managed Care) | Admitting: Hematology

## 2023-11-17 ENCOUNTER — Other Ambulatory Visit: Payer: Self-pay

## 2023-11-17 VITALS — BP 130/72 | HR 53 | Temp 97.3°F | Resp 16 | Ht 66.0 in | Wt 177.5 lb

## 2023-11-17 DIAGNOSIS — Z79899 Other long term (current) drug therapy: Secondary | ICD-10-CM | POA: Diagnosis not present

## 2023-11-17 DIAGNOSIS — C787 Secondary malignant neoplasm of liver and intrahepatic bile duct: Secondary | ICD-10-CM | POA: Diagnosis present

## 2023-11-17 DIAGNOSIS — C189 Malignant neoplasm of colon, unspecified: Secondary | ICD-10-CM

## 2023-11-17 DIAGNOSIS — C221 Intrahepatic bile duct carcinoma: Secondary | ICD-10-CM | POA: Diagnosis not present

## 2023-11-17 DIAGNOSIS — Z5112 Encounter for antineoplastic immunotherapy: Secondary | ICD-10-CM | POA: Diagnosis present

## 2023-11-17 DIAGNOSIS — C7800 Secondary malignant neoplasm of unspecified lung: Secondary | ICD-10-CM | POA: Diagnosis not present

## 2023-11-17 DIAGNOSIS — C19 Malignant neoplasm of rectosigmoid junction: Secondary | ICD-10-CM | POA: Diagnosis present

## 2023-11-17 DIAGNOSIS — Z5111 Encounter for antineoplastic chemotherapy: Secondary | ICD-10-CM | POA: Diagnosis not present

## 2023-11-17 DIAGNOSIS — E876 Hypokalemia: Secondary | ICD-10-CM | POA: Diagnosis not present

## 2023-11-17 DIAGNOSIS — Z90710 Acquired absence of both cervix and uterus: Secondary | ICD-10-CM | POA: Diagnosis not present

## 2023-11-17 DIAGNOSIS — Z452 Encounter for adjustment and management of vascular access device: Secondary | ICD-10-CM | POA: Diagnosis not present

## 2023-11-17 LAB — CBC WITH DIFFERENTIAL (CANCER CENTER ONLY)
Abs Immature Granulocytes: 0.02 K/uL (ref 0.00–0.07)
Basophils Absolute: 0 K/uL (ref 0.0–0.1)
Basophils Relative: 1 %
Eosinophils Absolute: 0.2 K/uL (ref 0.0–0.5)
Eosinophils Relative: 3 %
HCT: 34.2 % — ABNORMAL LOW (ref 36.0–46.0)
Hemoglobin: 11.2 g/dL — ABNORMAL LOW (ref 12.0–15.0)
Immature Granulocytes: 0 %
Lymphocytes Relative: 22 %
Lymphs Abs: 1.2 K/uL (ref 0.7–4.0)
MCH: 29.1 pg (ref 26.0–34.0)
MCHC: 32.7 g/dL (ref 30.0–36.0)
MCV: 88.8 fL (ref 80.0–100.0)
Monocytes Absolute: 0.5 K/uL (ref 0.1–1.0)
Monocytes Relative: 10 %
Neutro Abs: 3.4 K/uL (ref 1.7–7.7)
Neutrophils Relative %: 64 %
Platelet Count: 275 K/uL (ref 150–400)
RBC: 3.85 MIL/uL — ABNORMAL LOW (ref 3.87–5.11)
RDW: 16.9 % — ABNORMAL HIGH (ref 11.5–15.5)
WBC Count: 5.4 K/uL (ref 4.0–10.5)
nRBC: 0 % (ref 0.0–0.2)

## 2023-11-17 LAB — CMP (CANCER CENTER ONLY)
ALT: 19 U/L (ref 0–44)
AST: 16 U/L (ref 15–41)
Albumin: 4 g/dL (ref 3.5–5.0)
Alkaline Phosphatase: 140 U/L — ABNORMAL HIGH (ref 38–126)
Anion gap: 6 (ref 5–15)
BUN: 8 mg/dL (ref 8–23)
CO2: 30 mmol/L (ref 22–32)
Calcium: 9.2 mg/dL (ref 8.9–10.3)
Chloride: 104 mmol/L (ref 98–111)
Creatinine: 0.6 mg/dL (ref 0.44–1.00)
GFR, Estimated: >60 mL/min (ref >60)
Glucose, Bld: 133 mg/dL — ABNORMAL HIGH (ref 70–99)
Potassium: 3.4 mmol/L — ABNORMAL LOW (ref 3.5–5.1)
Sodium: 140 mmol/L (ref 135–145)
Total Bilirubin: 0.3 mg/dL (ref 0.0–1.2)
Total Protein: 7.2 g/dL (ref 6.5–8.1)

## 2023-11-17 LAB — TOTAL PROTEIN, URINE DIPSTICK: Protein, ur: NEGATIVE mg/dL

## 2023-11-17 LAB — CEA (ACCESS): CEA (CHCC): 45.05 ng/mL — ABNORMAL HIGH (ref 0.00–5.00)

## 2023-11-17 LAB — MAGNESIUM: Magnesium: 1.6 mg/dL — ABNORMAL LOW (ref 1.7–2.4)

## 2023-11-17 MED ORDER — MAGNESIUM OXIDE -MG SUPPLEMENT 400 (240 MG) MG PO TABS: 400.0000 mg | ORAL_TABLET | Freq: Two times a day (BID) | ORAL | 1 refills | Status: DC

## 2023-11-17 MED ORDER — SODIUM CHLORIDE 0.9 % IV SOLN
400.0000 mg/m2 | Freq: Once | INTRAVENOUS | Status: AC
  Administered 2023-11-17: 768 mg via INTRAVENOUS
  Filled 2023-11-17: qty 25

## 2023-11-17 MED ORDER — SODIUM CHLORIDE 0.9 % IV SOLN
2000.0000 mg/m2 | INTRAVENOUS | Status: DC
  Administered 2023-11-17: 3500 mg via INTRAVENOUS
  Filled 2023-11-17: qty 70

## 2023-11-17 MED ORDER — ATROPINE SULFATE 1 MG/ML IV SOLN
0.5000 mg | Freq: Once | INTRAVENOUS | Status: AC | PRN
  Administered 2023-11-17: 0.5 mg via INTRAVENOUS
  Filled 2023-11-17: qty 1

## 2023-11-17 MED ORDER — SODIUM CHLORIDE 0.9 % IV SOLN: INTRAVENOUS | Status: DC

## 2023-11-17 MED ORDER — SODIUM CHLORIDE 0.9 % IV SOLN
6.0000 mg/kg | Freq: Once | INTRAVENOUS | Status: AC
  Administered 2023-11-17: 500 mg via INTRAVENOUS
  Filled 2023-11-17: qty 5

## 2023-11-17 MED ORDER — SODIUM CHLORIDE 0.9 % IV SOLN
150.0000 mg/m2 | Freq: Once | INTRAVENOUS | Status: AC
  Administered 2023-11-17: 300 mg via INTRAVENOUS
  Filled 2023-11-17: qty 15

## 2023-11-17 MED ORDER — DEXAMETHASONE SODIUM PHOSPHATE 10 MG/ML IJ SOLN
10.0000 mg | Freq: Once | INTRAMUSCULAR | Status: AC
  Administered 2023-11-17: 10 mg via INTRAVENOUS
  Filled 2023-11-17: qty 1

## 2023-11-17 MED ORDER — MAGNESIUM SULFATE 2 GM/50ML IV SOLN
2.0000 g | Freq: Once | INTRAVENOUS | Status: AC
  Administered 2023-11-17: 2 g via INTRAVENOUS
  Filled 2023-11-17: qty 50

## 2023-11-17 MED ORDER — PALONOSETRON HCL INJECTION 0.25 MG/5ML
0.2500 mg | Freq: Once | INTRAVENOUS | Status: AC
  Administered 2023-11-17: 0.25 mg via INTRAVENOUS
  Filled 2023-11-17: qty 5

## 2023-11-17 MED ORDER — FAMOTIDINE IN NACL 20-0.9 MG/50ML-% IV SOLN
20.0000 mg | Freq: Once | INTRAVENOUS | Status: AC
  Administered 2023-11-17: 20 mg via INTRAVENOUS
  Filled 2023-11-17: qty 50

## 2023-11-17 NOTE — Addendum Note (Signed)
 Addended by: [name] on: 11/17/2023 12:57 PM   Modules accepted: Orders

## 2023-11-17 NOTE — Patient Instructions (Signed)
 [facility] - A DEPT OF [facility]  Discharge Instructions: Thank you for choosing [facility] to provide your oncology and hematology care.   If you have a lab appointment with the Cancer Center, please go directly to the Cancer Center and check in at the registration area.   Wear comfortable clothing and clothing appropriate for easy access to any Portacath or PICC line.   We strive to give you quality time with your provider. You may need to reschedule your appointment if you arrive late (15 or more minutes).  Arriving late affects you and other patients whose appointments are after yours.  Also, if you miss three or more appointments without notifying the office, you may be dismissed from the clinic at the provider's discretion.      For prescription refill requests, have your pharmacy contact our office and allow 72 hours for refills to be completed.    Today you received the following chemotherapy and/or immunotherapy agents: Vectibix , Irinotecan , Leucovorin , 5FU      To help prevent nausea and vomiting after your treatment, we encourage you to take your nausea medication as directed.  BELOW ARE SYMPTOMS THAT SHOULD BE REPORTED IMMEDIATELY: *FEVER GREATER THAN 100.4 F (38 C) OR HIGHER *CHILLS OR SWEATING *NAUSEA AND VOMITING THAT IS NOT CONTROLLED WITH YOUR NAUSEA MEDICATION *UNUSUAL SHORTNESS OF BREATH *UNUSUAL BRUISING OR BLEEDING *URINARY PROBLEMS (pain or burning when urinating, or frequent urination) *BOWEL PROBLEMS (unusual diarrhea, constipation, pain near the anus) TENDERNESS IN MOUTH AND THROAT WITH OR WITHOUT PRESENCE OF ULCERS (sore throat, sores in mouth, or a toothache) UNUSUAL RASH, SWELLING OR PAIN  UNUSUAL VAGINAL DISCHARGE OR ITCHING   Items with * indicate a potential emergency and should be followed up as soon as possible or go to the Emergency Department if any problems should occur.  Please show the CHEMOTHERAPY  ALERT CARD or IMMUNOTHERAPY ALERT CARD at check-in to the Emergency Department and triage nurse.  Should you have questions after your visit or need to cancel or reschedule your appointment, please contact [facility] - A DEPT OF [facility]  Dept: [phone]  and follow the prompts.  Office hours are 8:00 a.m. to 4:30 p.m. Monday - Friday. Please note that voicemails left after 4:00 p.m. may not be returned until the following business day.  We are closed weekends and major holidays. You have access to a nurse at all times for urgent questions. Please call the main number to the clinic Dept: [phone] and follow the prompts.   For any non-urgent questions, you may also contact your provider using MyChart. We now offer e-Visits for anyone 35 and older to request care online for non-urgent symptoms. For details visit [email or web address].   Also download the MyChart app! Go to the app store, search "MyChart", open the app, select [place], and log in with your MyChart username and password.

## 2023-11-17 NOTE — Progress Notes (Signed)
 [facility]   [phone] [phone]   Clinic Follow up Note   Patient Care Team: [name], MD as PCP - General (Internal Medicine) [name], DO as PCP - Cardiology (Cardiology) [name], MD as Consulting Physician (Oncology) [name], MD as Consulting Physician (Gastroenterology)  Date of Service:  11/17/2023  CHIEF COMPLAINT: f/u of metastatic colon cancer  CURRENT THERAPY:  FOLFIRI and Vectibix  every 2 weeks  Oncology History   metastatic colon 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 Liver biopsy 01/08/20 confirmed metastasis from primary colorectal cancer. PET scan showed hypermetabolism to known liver mets, diffuse thoracic and abdominal lymphadenopathy, a 1.8 cm LUL pulmonary nodule, and splenic flexure of colon. -treated with first line FOLFOX 01/25/20 - 10/02/20, Vectibix  added with C2. Oxali discontinued after C18 due to reaction. -switched to Xeloda  10/21/20, dose adjusted due to significant skin toxicity -due to cancer progression, treatment has been changed to FOLFIRI and bevacizumab  on 03/25/22 -will repeat CT scan after next cycle chemo  -Restaging CT scan from 06/16/2022 showed stable to mild decrease in size of treated liver metastasis. No new lesions -Her restaging CT scan on August 31, 2022 showed stable liver metastasis, and a stable 7 mm left lung nodule, indeterminate.  -restaging CT in 06/2023 showed mild disease progression in liver, I changed Beva to vectibix   - Restaging CT scan on September 27, 2023 showed stable disease, will continue current treatment.  Assessment & Plan Metastatic colon cancer Metastatic colon cancer is well-managed with no new neurological symptoms or issues with hand or foot function. No new chemotherapy-related problems. She is eating well and has gained weight. Skin is  slightly darkened without peeling. - Continue current treatment regimen - Schedule next scan for end of October  Hypomagnesemia Magnesium  levels are low, likely due to Vectibix  treatment. - Call in magnesium  prescription to [facility] pharmacy - Administer magnesium  infusion of 2 grams over one hour  Hypokalemia Potassium levels are slightly low. Previously stopped potassium supplementation. - Resume potassium supplementation once daily for a week, then every other day  Plan - Lab reviewed, adequate for treatment, will proceed chemo and Vectibix  today and continue every 2 weeks - Will add IV mag 2 g to her treatment plan, I also called in oral magnesium .  She will also restart oral potassium at home - Follow-up in 2 weeks   SUMMARY OF ONCOLOGIC HISTORY: Oncology History  metastatic colon cancer to liver  06/20/2019 Procedure   Colonoscopy by Dr Wilhelmenia 06/20/19  IMPRESSION -Seven 3 to 10 mm polyps in the sigmoid colon, in the transverse colon and in the escending colon, removed with a cold snare. Resected and retrireved.  -One 20mm polyp in the descending colon. Biopsies. Tattoes.  -Mediaum sized lipoma in the ascending colon.   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 See Comment B. Colon, Ascending, polyp, Polypectomy:  -TUBULAR ADENOMA -No high grade dysplasia or malignancy.  C. Colon, TRansverse, Polyo, polectomy:  -TUBULAR ADENOMA -No high grade dysplasia or malignancy.  D. Colon, Sigmoid, Polyp, Polypectomy:  -HYPERPLASTIC POLYP   11/07/2019 Imaging   US  Abdomen 11/07/19  IMPRESSION: 1. Two solid masses in the liver are nonspecific. Recommend MRI abdomen with and without contrast for further evaluation.   12/15/2019 Imaging   MRI Abdomen 12/15/19  IMPRESSION:  1. There are two large masses in the liver with appearance favoring metastatic disease or hepatocellular carcinoma  or cholangiocarcinoma. A benign etiology is highly unlikely given the enhancement pattern and associated adenopathy. 2. Considerable porta hepatis and retroperitoneal adenopathy. Some of the confluent porta hepatis tumor is potentially infiltrative and abuts the pancreatic body along its right upper margin, making it difficult to completely exclude the possibility of pancreatic adenocarcinoma primary. Possibilities helpful in further workup might include tissue diagnosis, endoscopic ultrasound, or nuclear medicine PET-CT. 3. Pancreas divisum. 4. Lumbar spondylosis and degenerative disc disease. 5. Despite efforts by the technologist and patient, motion artifact is present on today's exam and could not be eliminated. This reduces exam sensitivity and specificity.   12/22/2019 Procedure   Upper Endoscopy by Dr Rollin 12/22/19  IMPRESSION - One lymph node was visualized and measured in the porta hepatis region. Fine needle aspiration performed    12/22/2019 Initial Biopsy   A. LIVER, PORTA HEPATIS MASS, FINE NEEDLE 12/22/19 ASPIRATION:  FINAL MICROSCOPIC DIAGNOSIS:  - Malignant cells consistent with metastatic adenocarcinoma   01/01/2020 Initial Diagnosis   Intrahepatic cholangiocarcinoma (HCC)   01/08/2020 Initial Biopsy   FINAL MICROSCOPIC DIAGNOSIS:   A. LIVER, LEFT LOBE, BIOPSY:  - Metastatic adenocarcinoma, consistent with a colorectal primary.  See  comment      COMMENT:   Immunohistochemical stains show the tumor cells are positive for CK20  and CDX2 but negative for CK7, consistent with above interpretation.  Dr. 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   01/24/2020 Procedure   PAC placed 01/24/20   01/25/2020 -  Chemotherapy   first line FOLFOX starting 01/25/2020, Vextibix  added with C2 (02/06/20)   02/06/2020 - 02/06/2022  Chemotherapy   Patient is on Treatment Plan : COLORECTAL Panitumumab  q14d ([name] - Type Gene Only)     06/20/2020 Imaging   CT CAP  IMPRESSION: 1. Continued interval reduction in size and conspicuity of a subsolid nodule of the peripheral left upper lobe. 2. Unchanged prominent pretracheal and subcarinal lymph nodes. 3. Redemonstrated partially calcified low-attenuation liver masses, slightly decreased in size compared to prior examination. 4. Slight interval decrease in size of a portacaval lymph node or conglomerate and retroperitoneal lymph nodes. 5. Findings are consistent with continued treatment response of nodal, pulmonary, and hepatic metastatic disease. 6. Coronary artery disease.   Aortic Atherosclerosis (ICD10-I70.0).     10/09/2020 Imaging   IMPRESSION: 1. Slight decrease in size of dominant liver mass with smaller liver mass within 1-2 mm of prior measurement. 2. Stable appearance of celiac lymph and retroperitoneal lymph nodes. Dominant node with calcification in the gastrohepatic ligament as described. 3. Continued decrease in size of LEFT upper lobe nodule. 4. Three-vessel coronary artery calcification. 5. Aortic atherosclerosis.   03/25/2022 - 07/16/2023 Chemotherapy   Patient is on Treatment Plan : COLORECTAL FOLFIRI + Bevacizumab  q14d     08/31/2022 Imaging    IMPRESSION: 1. Unchanged, densely calcified liver lesions, consistent with treated metastatic disease. 2. Unchanged, irregular subpleural opacity of the peripheral left upper lobe. 3. Unchanged subcentimeter gastrohepatic ligament lymph node. 4. No evidence of new metastatic disease in the chest, abdomen, or pelvis. 5. Status post hysterectomy. 6. Coronary artery disease. 7. Aortic valve calcifications. Correlate for echocardiographic evidence of aortic valve dysfunction.     06/22/2023 Imaging   CT Chest, Abdomen, and pelvis with  contrast  IMPRESSION: Centrally calcified liver lesions are  again seen and slightly increased today. Prominent nodes in the upper retroperitoneum are overall similar. Attention on follow-up.   Stable branching left upper lobe lung nodule.   08/11/2023 -  Chemotherapy   Patient is on Treatment Plan : COLORECTAL FOLFIRI + Panitumumab  q14d        Discussed the use of AI scribe software for clinical note transcription with the patient, who gave verbal consent to proceed.  History of Present Illness Lindsay [name] is a 68 year old female with metastatic colon cancer who presents for follow-up.  Her chemotherapy tolerance is stable with no new problems. She has gained weight and maintains a good appetite. Laboratory tests show normal blood counts and kidney and liver function. Potassium level is slightly low, and she has previously stopped taking potassium supplements. She does not currently take magnesium  supplements but has in the past. She has no difficulty swallowing pills and has a supply of potassium at home.     All other systems were reviewed with the patient and are negative.  MEDICAL HISTORY:  Past Medical History:  Diagnosis Date   Arthritis    Diabetes mellitus without complication (HCC)    Hypertension    met colon ca to liver 01/2020    SURGICAL HISTORY: Past Surgical History:  Procedure Laterality Date   ABDOMINAL HYSTERECTOMY     ANTERIOR AND POSTERIOR REPAIR WITH SACROSPINOUS FIXATION N/A 07/16/2016   Procedure: ANTERIOR AND POSTERIOR REPAIR WITH SACROSPINOUS FIXATION;  Surgeon: Curlene Agent, MD;  Location: [facility];  Service: Gynecology;  Laterality: N/A;   CYSTOSCOPY  07/16/2016   Procedure: CYSTOSCOPY;  Surgeon: Curlene Agent, MD;  Location: [facility];  Service: Gynecology;;   ESOPHAGOGASTRODUODENOSCOPY (EGD) WITH PROPOFOL  N/A 12/22/2019   Procedure: ESOPHAGOGASTRODUODENOSCOPY (EGD) WITH PROPOFOL ;  Surgeon: [name], MD;  Location: [facility];  Service: Endoscopy;  Laterality: N/A;   FINE NEEDLE ASPIRATION N/A 12/22/2019    Procedure: FINE NEEDLE ASPIRATION (FNA) LINEAR;  Surgeon: [name], MD;  Location: [facility];  Service: Endoscopy;  Laterality: N/A;   IR IMAGING GUIDED PORT INSERTION  01/24/2020   IR REMOVAL TUN ACCESS W/ PORT W/O FL MOD SED  07/30/2023   LAPAROSCOPIC VAGINAL HYSTERECTOMY WITH SALPINGECTOMY Bilateral 07/16/2016   Procedure: LAPAROSCOPIC ASSISTED VAGINAL HYSTERECTOMY WITH SALPINGECTOMY;  Surgeon: Curlene Agent, MD;  Location: [facility];  Service: Gynecology;  Laterality: Bilateral;   LEFT HEART CATH AND CORONARY ANGIOGRAPHY N/A 05/07/2023   Procedure: LEFT HEART CATH AND CORONARY ANGIOGRAPHY;  Surgeon: Anner Alm ORN, MD;  Location: [facility] IN[facility] CV [facility];  Service: Cardiovascular;  Laterality: N/A;   MYOMECTOMY ABDOMINAL APPROACH     THYROID SURGERY     tyroid     UPPER ESOPHAGEAL ENDOSCOPIC ULTRASOUND (EUS) N/A 12/22/2019   Procedure: UPPER ESOPHAGEAL ENDOSCOPIC ULTRASOUND (EUS);  Surgeon: [name], MD;  Location: [facility];  Service: Endoscopy;  Laterality: N/A;    I have reviewed the social history and family history with the patient and they are unchanged from previous note.  ALLERGIES:  is allergic to oxaliplatin .  MEDICATIONS:  Current Outpatient Medications  Medication Sig Dispense Refill   magnesium  oxide (MAG-OX) 400 (240 Mg) MG tablet Take 1 tablet (400 mg total) by mouth 2 (two) times daily. 60 tablet 1   aspirin  EC 81 MG tablet Take 1 tablet (81 mg total) by mouth daily. Swallow whole. 90 tablet 0   atorvastatin  (LIPITOR) 80 MG tablet Take 1 tablet (80 mg total)  by mouth daily. 90 tablet 0   clindamycin  (CLEOCIN -T) 1 % lotion Apply topically as needed. 60 mL 0   clopidogrel  (PLAVIX ) 75 MG tablet Take 1 tablet (75 mg total) by mouth daily. 90 tablet 0   doxycycline  (VIBRA -TABS) 100 MG tablet Take 1 tablet (100 mg total) by mouth 2 (two) times daily. 60 tablet 3   ezetimibe  (ZETIA ) 10 MG tablet Take 1 tablet (10 mg total) by mouth daily. 90 tablet 3   isosorbide   mononitrate (IMDUR ) 30 MG 24 hr tablet Take 1 tablet (30 mg total) by mouth every evening. 90 tablet 3   metoprolol  succinate (TOPROL  XL) 25 MG 24 hr tablet Take 1 tablet (25 mg total) by mouth daily. Hold if systolic BP (top number) is less than 100 and/or heart rate is less than 55 (Patient not taking: Reported on 11/16/2023) 90 tablet 3   nitroGLYCERIN  (NITROSTAT ) 0.4 MG SL tablet Place 1 tablet (0.4 mg total) under the tongue every 5 (five) minutes as needed for chest pain. 75 tablet 3   ranolazine  (RANEXA ) 500 MG 12 hr tablet Take 1 tablet (500 mg total) by mouth 2 (two) times daily. 180 tablet 3   No current facility-administered medications for this visit.   Facility-Administered Medications Ordered in Other Visits  Medication Dose Route Frequency Provider Last Rate Last Admin   0.9 %  sodium chloride  infusion   Intravenous Continuous [name], MD 10 mL/hr at 11/17/23 1018 New Bag at 11/17/23 1018   atropine  injection 0.5 mg  0.5 mg Intravenous Once PRN [name], MD       fluorouracil  (ADRUCIL ) 3,500 mg in sodium chloride  0.9 % 80 mL chemo infusion  2,000 mg/m2 (Treatment Plan Recorded) Intravenous 1 day or 1 dose [name], MD       irinotecan  (CAMPTOSAR ) 300 mg in sodium chloride  0.9 % 500 mL chemo infusion  150 mg/m2 (Treatment Plan Recorded) Intravenous Once [name], MD       leucovorin  768 mg in sodium chloride  0.9 % 250 mL infusion  400 mg/m2 (Treatment Plan Recorded) Intravenous Once [name], MD       panitumumab  (VECTIBIX ) 500 mg in sodium chloride  0.9 % 100 mL chemo infusion  6 mg/kg (Treatment Plan Recorded) Intravenous Once [name], MD 250 mL/hr at 11/17/23 1227 500 mg at 11/17/23 1227    PHYSICAL EXAMINATION: ECOG PERFORMANCE STATUS: 1 - Symptomatic but completely ambulatory  Vitals:   11/17/23 0935  BP: 130/72  Pulse: (!) 53  Resp: 16  Temp: (!) 97.3 F (36.3 C)  SpO2: 97%   Wt Readings from Last 3 Encounters:  11/17/23 177 lb 8 oz (80.5 kg)  11/16/23 175 lb  12.8 oz (79.7 kg)  11/03/23 175 lb 6.4 oz (79.6 kg)     GENERAL:alert, no distress and comfortable SKIN: skin color, texture, turgor are normal, no rashes or significant lesions except hyperpigmentation in hands. EYES: normal, Conjunctiva are pink and non-injected, sclera clear NECK: supple, thyroid normal size, non-tender, without nodularity LYMPH:  no palpable lymphadenopathy in the cervical, axillary  LUNGS: clear to auscultation and percussion with normal breathing effort HEART: regular rate & rhythm and no murmurs and no lower extremity edema ABDOMEN:abdomen soft, non-tender and normal bowel sounds Musculoskeletal:no cyanosis of digits and no clubbing  NEURO: alert & oriented x 3 with fluent speech, no focal motor/sensory deficits  Physical Exam    LABORATORY DATA:  I have reviewed the data as listed    Latest Ref Rng &  Units 11/17/2023    8:54 AM 11/03/2023    9:50 AM 10/20/2023    9:46 AM  CBC  WBC 4.0 - 10.5 K/uL 5.4  5.8  8.2   Hemoglobin 12.0 - 15.0 g/dL 88.7  88.2  88.7   Hematocrit 36.0 - 46.0 % 34.2  36.2  34.6   Platelets 150 - 400 K/uL 275  262  256         Latest Ref Rng & Units 11/17/2023    8:54 AM 11/03/2023    9:50 AM 10/20/2023    9:46 AM  CMP  Glucose 70 - 99 mg/dL 866  882  890   BUN 8 - 23 mg/dL 8  8  8    Creatinine 0.44 - 1.00 mg/dL 9.39  9.38  9.44   Sodium 135 - 145 mmol/L 140  140  141   Potassium 3.5 - 5.1 mmol/L 3.4  3.8  3.6   Chloride 98 - 111 mmol/L 104  105  106   CO2 22 - 32 mmol/L 30  30  30    Calcium  8.9 - 10.3 mg/dL 9.2  9.3  9.1   Total Protein 6.5 - 8.1 g/dL 7.2  6.9  7.0   Total Bilirubin 0.0 - 1.2 mg/dL 0.3  0.3  0.3   Alkaline Phos 38 - 126 U/L 140  128  111   AST 15 - 41 U/L 16  18  15    ALT 0 - 44 U/L 19  23  15        RADIOGRAPHIC STUDIES: I have personally reviewed the radiological images as listed and agreed with the findings in the report. No results found.    Orders Placed This Encounter  Procedures   CBC with  Differential (Cancer Center Only)    Standing Status:   Future    Expected Date:   12/29/2023    Expiration Date:   12/28/2024   CMP (Cancer Center only)    Standing Status:   Future    Expected Date:   12/29/2023    Expiration Date:   12/28/2024   Magnesium     Standing Status:   Future    Expected Date:   12/29/2023    Expiration Date:   12/28/2024   All questions were answered. The patient knows to call the clinic with any problems, questions or concerns. No barriers to learning was detected. The total time spent in the appointment was 30 minutes, including review of chart and various tests results, discussions about plan of care and coordination of care plan     Onita Mattock, MD 11/17/2023

## 2023-11-17 NOTE — Assessment & Plan Note (Addendum)
 MSS, [name]/[name] wildtype -diagnosed 12/2019 by porta hepatis LN biopsy during EUS for work up of abdominal pain and liver lesions seen on US  and MRI. Liver biopsy 01/08/20 confirmed metastasis from primary colorectal cancer. PET scan showed hypermetabolism to known liver mets, diffuse thoracic and abdominal lymphadenopathy, a 1.8 cm LUL pulmonary nodule, and splenic flexure of colon. -treated with first line FOLFOX 01/25/20 - 10/02/20, Vectibix  added with C2. Oxali discontinued after C18 due to reaction. -switched to Xeloda  10/21/20, dose adjusted due to significant skin toxicity -due to cancer progression, treatment has been changed to FOLFIRI and bevacizumab  on 03/25/22 -will repeat CT scan after next cycle chemo  -Restaging CT scan from 06/16/2022 showed stable to mild decrease in size of treated liver metastasis. No new lesions -Her restaging CT scan on August 31, 2022 showed stable liver metastasis, and a stable 7 mm left lung nodule, indeterminate.  -restaging CT in 06/2023 showed mild disease progression in liver, I changed Beva to vectibix   - Restaging CT scan on September 27, 2023 showed stable disease, will continue current treatment.

## 2023-11-19 ENCOUNTER — Inpatient Hospital Stay: Payer: Medicare (Managed Care)

## 2023-11-24 ENCOUNTER — Inpatient Hospital Stay: Payer: Medicare (Managed Care)

## 2023-11-24 DIAGNOSIS — C189 Malignant neoplasm of colon, unspecified: Secondary | ICD-10-CM

## 2023-11-24 DIAGNOSIS — Z5112 Encounter for antineoplastic immunotherapy: Secondary | ICD-10-CM | POA: Diagnosis not present

## 2023-11-24 DIAGNOSIS — C221 Intrahepatic bile duct carcinoma: Secondary | ICD-10-CM

## 2023-11-24 LAB — CBC WITH DIFFERENTIAL (CANCER CENTER ONLY)
Abs Immature Granulocytes: 0.02 K/uL (ref 0.00–0.07)
Basophils Absolute: 0.1 K/uL (ref 0.0–0.1)
Basophils Relative: 1 %
Eosinophils Absolute: 0.2 K/uL (ref 0.0–0.5)
Eosinophils Relative: 4 %
HCT: 35.4 % — ABNORMAL LOW (ref 36.0–46.0)
Hemoglobin: 11.7 g/dL — ABNORMAL LOW (ref 12.0–15.0)
Immature Granulocytes: 0 %
Lymphocytes Relative: 30 %
Lymphs Abs: 1.7 K/uL (ref 0.7–4.0)
MCH: 28.9 pg (ref 26.0–34.0)
MCHC: 33.1 g/dL (ref 30.0–36.0)
MCV: 87.4 fL (ref 80.0–100.0)
Monocytes Absolute: 0.4 K/uL (ref 0.1–1.0)
Monocytes Relative: 7 %
Neutro Abs: 3.3 K/uL (ref 1.7–7.7)
Neutrophils Relative %: 58 %
Platelet Count: 287 K/uL (ref 150–400)
RBC: 4.05 MIL/uL (ref 3.87–5.11)
RDW: 16.6 % — ABNORMAL HIGH (ref 11.5–15.5)
WBC Count: 5.6 K/uL (ref 4.0–10.5)
nRBC: 0 % (ref 0.0–0.2)

## 2023-11-24 LAB — CMP (CANCER CENTER ONLY)
ALT: 29 U/L (ref 0–44)
AST: 18 U/L (ref 15–41)
Albumin: 4.2 g/dL (ref 3.5–5.0)
Alkaline Phosphatase: 153 U/L — ABNORMAL HIGH (ref 38–126)
Anion gap: 5 (ref 5–15)
BUN: 7 mg/dL — ABNORMAL LOW (ref 8–23)
CO2: 30 mmol/L (ref 22–32)
Calcium: 9.5 mg/dL (ref 8.9–10.3)
Chloride: 104 mmol/L (ref 98–111)
Creatinine: 0.68 mg/dL (ref 0.44–1.00)
GFR, Estimated: >60 mL/min (ref >60)
Glucose, Bld: 115 mg/dL — ABNORMAL HIGH (ref 70–99)
Potassium: 3.8 mmol/L (ref 3.5–5.1)
Sodium: 139 mmol/L (ref 135–145)
Total Bilirubin: 0.3 mg/dL (ref 0.0–1.2)
Total Protein: 7.6 g/dL (ref 6.5–8.1)

## 2023-12-01 ENCOUNTER — Inpatient Hospital Stay: Payer: Medicare (Managed Care) | Admitting: Physician Assistant

## 2023-12-01 ENCOUNTER — Inpatient Hospital Stay: Payer: Medicare (Managed Care)

## 2023-12-01 VITALS — BP 140/80 | HR 64 | Temp 97.5°F | Resp 13 | Wt 176.1 lb

## 2023-12-01 DIAGNOSIS — C221 Intrahepatic bile duct carcinoma: Secondary | ICD-10-CM | POA: Diagnosis not present

## 2023-12-01 DIAGNOSIS — R110 Nausea: Secondary | ICD-10-CM

## 2023-12-01 DIAGNOSIS — Z5112 Encounter for antineoplastic immunotherapy: Secondary | ICD-10-CM | POA: Diagnosis not present

## 2023-12-01 DIAGNOSIS — Z5111 Encounter for antineoplastic chemotherapy: Secondary | ICD-10-CM | POA: Diagnosis not present

## 2023-12-01 DIAGNOSIS — C189 Malignant neoplasm of colon, unspecified: Secondary | ICD-10-CM

## 2023-12-01 LAB — CMP (CANCER CENTER ONLY)
ALT: 57 U/L — ABNORMAL HIGH (ref 0–44)
AST: 37 U/L (ref 15–41)
Albumin: 4 g/dL (ref 3.5–5.0)
Alkaline Phosphatase: 154 U/L — ABNORMAL HIGH (ref 38–126)
Anion gap: 5 (ref 5–15)
BUN: 8 mg/dL (ref 8–23)
CO2: 29 mmol/L (ref 22–32)
Calcium: 9.1 mg/dL (ref 8.9–10.3)
Chloride: 105 mmol/L (ref 98–111)
Creatinine: 0.65 mg/dL (ref 0.44–1.00)
GFR, Estimated: >60 mL/min (ref >60)
Glucose, Bld: 148 mg/dL — ABNORMAL HIGH (ref 70–99)
Potassium: 3.6 mmol/L (ref 3.5–5.1)
Sodium: 139 mmol/L (ref 135–145)
Total Bilirubin: 0.3 mg/dL (ref 0.0–1.2)
Total Protein: 7.2 g/dL (ref 6.5–8.1)

## 2023-12-01 LAB — CBC WITH DIFFERENTIAL (CANCER CENTER ONLY)
Abs Immature Granulocytes: 0.02 K/uL (ref 0.00–0.07)
Basophils Absolute: 0 K/uL (ref 0.0–0.1)
Basophils Relative: 1 %
Eosinophils Absolute: 0.2 K/uL (ref 0.0–0.5)
Eosinophils Relative: 5 %
HCT: 33.3 % — ABNORMAL LOW (ref 36.0–46.0)
Hemoglobin: 10.9 g/dL — ABNORMAL LOW (ref 12.0–15.0)
Immature Granulocytes: 0 %
Lymphocytes Relative: 20 %
Lymphs Abs: 1 K/uL (ref 0.7–4.0)
MCH: 28.8 pg (ref 26.0–34.0)
MCHC: 32.7 g/dL (ref 30.0–36.0)
MCV: 88.1 fL (ref 80.0–100.0)
Monocytes Absolute: 0.6 K/uL (ref 0.1–1.0)
Monocytes Relative: 13 %
Neutro Abs: 3 K/uL (ref 1.7–7.7)
Neutrophils Relative %: 61 %
Platelet Count: 239 K/uL (ref 150–400)
RBC: 3.78 MIL/uL — ABNORMAL LOW (ref 3.87–5.11)
RDW: 17 % — ABNORMAL HIGH (ref 11.5–15.5)
WBC Count: 4.9 K/uL (ref 4.0–10.5)
nRBC: 0 % (ref 0.0–0.2)

## 2023-12-01 LAB — MAGNESIUM: Magnesium: 1.6 mg/dL — ABNORMAL LOW (ref 1.7–2.4)

## 2023-12-01 LAB — CEA (ACCESS): CEA (CHCC): 53.84 ng/mL — ABNORMAL HIGH (ref 0.00–5.00)

## 2023-12-01 MED ORDER — PROCHLORPERAZINE MALEATE 10 MG PO TABS: 10.0000 mg | ORAL_TABLET | Freq: Four times a day (QID) | ORAL | 0 refills | Status: AC | PRN

## 2023-12-01 MED ORDER — SODIUM CHLORIDE 0.9 % IV SOLN
150.0000 mg/m2 | Freq: Once | INTRAVENOUS | Status: AC
  Administered 2023-12-01: 300 mg via INTRAVENOUS
  Filled 2023-12-01: qty 15

## 2023-12-01 MED ORDER — ATROPINE SULFATE 1 MG/ML IV SOLN
0.5000 mg | Freq: Once | INTRAVENOUS | Status: AC | PRN
  Administered 2023-12-01: 0.5 mg via INTRAVENOUS
  Filled 2023-12-01: qty 1

## 2023-12-01 MED ORDER — SODIUM CHLORIDE 0.9 % IV SOLN
400.0000 mg/m2 | Freq: Once | INTRAVENOUS | Status: AC
  Administered 2023-12-01: 768 mg via INTRAVENOUS
  Filled 2023-12-01: qty 17.5

## 2023-12-01 MED ORDER — PALONOSETRON HCL INJECTION 0.25 MG/5ML
0.2500 mg | Freq: Once | INTRAVENOUS | Status: AC
  Administered 2023-12-01: 0.25 mg via INTRAVENOUS
  Filled 2023-12-01: qty 5

## 2023-12-01 MED ORDER — PROCHLORPERAZINE EDISYLATE 10 MG/2ML IJ SOLN
10.0000 mg | Freq: Once | INTRAMUSCULAR | Status: AC
  Administered 2023-12-01: 10 mg via INTRAVENOUS
  Filled 2023-12-01: qty 2

## 2023-12-01 MED ORDER — DEXAMETHASONE SODIUM PHOSPHATE 10 MG/ML IJ SOLN
10.0000 mg | Freq: Once | INTRAMUSCULAR | Status: AC
  Administered 2023-12-01: 10 mg via INTRAVENOUS
  Filled 2023-12-01: qty 1

## 2023-12-01 MED ORDER — MAGNESIUM SULFATE 2 GM/50ML IV SOLN
2.0000 g | Freq: Once | INTRAVENOUS | Status: AC
  Administered 2023-12-01: 2 g via INTRAVENOUS
  Filled 2023-12-01: qty 50

## 2023-12-01 MED ORDER — FAMOTIDINE IN NACL 20-0.9 MG/50ML-% IV SOLN
20.0000 mg | Freq: Once | INTRAVENOUS | Status: AC
  Administered 2023-12-01: 20 mg via INTRAVENOUS
  Filled 2023-12-01: qty 50

## 2023-12-01 MED ORDER — SODIUM CHLORIDE 0.9 % IV SOLN
6.0000 mg/kg | Freq: Once | INTRAVENOUS | Status: AC
  Administered 2023-12-01: 500 mg via INTRAVENOUS
  Filled 2023-12-01: qty 5

## 2023-12-01 MED ORDER — SODIUM CHLORIDE 0.9 % IV SOLN: INTRAVENOUS | Status: DC

## 2023-12-01 MED ORDER — SODIUM CHLORIDE 0.9 % IV SOLN
2000.0000 mg/m2 | INTRAVENOUS | Status: DC
  Administered 2023-12-01: 3500 mg via INTRAVENOUS
  Filled 2023-12-01: qty 70

## 2023-12-01 NOTE — Patient Instructions (Signed)
 [facility] - A DEPT OF . [facility]  Discharge Instructions: Thank you for choosing [facility] to provide your oncology and hematology care.   If you have a lab appointment with the Cancer Center, please go directly to the Cancer Center and check in at the registration area.   Wear comfortable clothing and clothing appropriate for easy access to any Portacath or PICC line.   We strive to give you quality time with your provider. You may need to reschedule your appointment if you arrive late (15 or more minutes).  Arriving late affects you and other patients whose appointments are after yours.  Also, if you miss three or more appointments without notifying the office, you may be dismissed from the clinic at the provider's discretion.      For prescription refill requests, have your pharmacy contact our office and allow 72 hours for refills to be completed.    Today you received the following chemotherapy and/or immunotherapy agents: Vectibix , Irinotecan , Leucovorin , 5FU      To help prevent nausea and vomiting after your treatment, we encourage you to take your nausea medication as directed.  BELOW ARE SYMPTOMS THAT SHOULD BE REPORTED IMMEDIATELY: *FEVER GREATER THAN 100.4 F (38 C) OR HIGHER *CHILLS OR SWEATING *NAUSEA AND VOMITING THAT IS NOT CONTROLLED WITH YOUR NAUSEA MEDICATION *UNUSUAL SHORTNESS OF BREATH *UNUSUAL BRUISING OR BLEEDING *URINARY PROBLEMS (pain or burning when urinating, or frequent urination) *BOWEL PROBLEMS (unusual diarrhea, constipation, pain near the anus) TENDERNESS IN MOUTH AND THROAT WITH OR WITHOUT PRESENCE OF ULCERS (sore throat, sores in mouth, or a toothache) UNUSUAL RASH, SWELLING OR PAIN  UNUSUAL VAGINAL DISCHARGE OR ITCHING   Items with * indicate a potential emergency and should be followed up as soon as possible or go to the Emergency Department if any problems should occur.  Please show the CHEMOTHERAPY  ALERT CARD or IMMUNOTHERAPY ALERT CARD at check-in to the Emergency Department and triage nurse.  Should you have questions after your visit or need to cancel or reschedule your appointment, please contact [facility] DE[name]: [phone]  and follow the prompts.  Office hours are 8:00 a.m. to 4:30 p.m. Monday - Friday. Please note that voicemails left after 4:00 p.m. may not be returned until the following business day.  We are closed weekends and major holidays. You have access to a nurse at all times for urgent questions. Please call the main number to the clinic Dept: [phone] and follow the prompts.   For any non-urgent questions, you may also contact your provider using MyChart. We now offer e-Visits for anyone 31 and older to request care online for non-urgent symptoms. For details visit [email or web address].   Also download the MyChart app! Go to the app store, search MyChart, open the app, select , and log in with your MyChart username and password.  Hypomagnesemia Hypomagnesemia is a condition in which the level of magnesium  in the blood is too low. Magnesium  is a mineral that is found in many foods. It is used in many different processes in the body. Hypomagnesemia can affect every organ in the body. In severe cases, it can cause life-threatening problems. What are the causes? This condition may be caused by: Not getting enough magnesium  in your diet or not having enough healthy foods to eat (malnutrition). Problems with magnesium  absorption in the intestines. Dehydration. Excessive use  of alcohol. Vomiting. Severe or long-term (chronic) diarrhea. Some medicines, including medicines that make you urinate more often (diuretics). Certain diseases, such as kidney disease, diabetes, celiac disease, and overactive thyroid. What are the signs or symptoms? Symptoms of this condition include: Loss of appetite,  nausea, and vomiting. Involuntary shaking or trembling of a body part (tremor). Muscle weakness or tingling in the arms and legs. Sudden tightening of muscles (muscle spasms). Confusion. Psychiatric issues, such as: Depression and irritability. Psychosis. A feeling of fluttering of the heart (palpitations). Seizures. These symptoms are more severe if magnesium  levels drop suddenly. How is this diagnosed? This condition may be diagnosed based on: Your symptoms and medical history. A physical exam. Blood and urine tests. How is this treated? Treatment depends on the cause and the severity of the condition. It may be treated by: Taking a magnesium  supplement. This can be taken in pill form. If the condition is severe, magnesium  is usually given through an IV. Making changes to your diet. You may be directed to eat foods that have a lot of magnesium , such as green leafy vegetables, peas, beans, and nuts. Not drinking alcohol. If you are struggling not to drink, ask your health care provider for help. Follow these instructions at home: Eating and drinking     Make sure that your diet includes foods with magnesium . Foods that have a lot of magnesium  in them include: Green leafy vegetables, such as spinach and broccoli. Beans and peas. Nuts and seeds, such as almonds and sunflower seeds. Whole grains, such as whole grain bread and fortified cereals. Drink fluids that contain salts and minerals (electrolytes), such as sports drinks, when you are active. Do not drink alcohol. General instructions Take over-the-counter and prescription medicines only as told by your health care provider. Take magnesium  supplements as directed if your health care provider tells you to take them. Have your magnesium  levels monitored as told by your health care provider. Keep all follow-up visits. This is important. Contact a health care provider if: You get worse instead of better. Your symptoms  return. Get help right away if: You develop severe muscle weakness. You have trouble breathing. You feel that your heart is racing. These symptoms may represent a serious problem that is an emergency. Do not wait to see if the symptoms will go away. Get medical help right away. Call your local emergency services (911 in the [place] Do not drive yourself to the hospital. Summary Hypomagnesemia is a condition in which the level of magnesium  in the blood is too low. Hypomagnesemia can affect every organ in the body. Treatment may include eating more foods that contain magnesium , taking magnesium  supplements, and not drinking alcohol. Have your magnesium  levels monitored as told by your health care provider. This information is not intended to replace advice given to you by your health care provider. Make sure you discuss any questions you have with your health care provider. Document Revised: 07/23/2020 Document Reviewed: 07/23/2020 Elsevier Patient Education  2024 [employer].

## 2023-12-01 NOTE — Progress Notes (Signed)
 [facility]   Telephone:([phone] [phone]) [phone]   Clinic Follow up Note   Patient Care Team: [name], MD as PCP - General (Internal Medicine) [name], DO as PCP - Cardiology (Cardiology) [name], MD as Consulting Physician (Oncology) [name], MD as Consulting Physician (Gastroenterology)  Date of Service:  12/01/2023  CHIEF COMPLAINT: f/u of metastatic colon cancer  CURRENT THERAPY:  FOLFIRI and vectibix   SUMMARY OF ONCOLOGIC HISTORY: Oncology History  metastatic colon cancer to liver  06/20/2019 Procedure   Colonoscopy by Dr Wilhelmenia 06/20/19  IMPRESSION -Seven 3 to 10 mm polyps in the sigmoid colon, in the transverse colon and in the escending colon, removed with a cold snare. Resected and retrireved.  -One 20mm polyp in the descending colon. Biopsies. Tattoes.  -Mediaum sized lipoma in the ascending colon.   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 See Comment B. Colon, Ascending, polyp, Polypectomy:  -TUBULAR ADENOMA -No high grade dysplasia or malignancy.  C. Colon, TRansverse, Polyo, polectomy:  -TUBULAR ADENOMA -No high grade dysplasia or malignancy.  D. Colon, Sigmoid, Polyp, Polypectomy:  -HYPERPLASTIC POLYP   11/07/2019 Imaging   US  Abdomen 11/07/19  IMPRESSION: 1. Two solid masses in the liver are nonspecific. Recommend MRI abdomen with and without contrast for further evaluation.   12/15/2019 Imaging   MRI Abdomen 12/15/19  IMPRESSION: 1. There are two large masses in the liver with appearance favoring metastatic disease or hepatocellular carcinoma or cholangiocarcinoma. A benign etiology is highly unlikely given the enhancement pattern and associated adenopathy. 2. Considerable porta hepatis and retroperitoneal adenopathy. Some of the confluent porta hepatis tumor is potentially infiltrative and abuts the pancreatic body along its  right upper margin, making it difficult to completely exclude the possibility of pancreatic adenocarcinoma primary. Possibilities helpful in further workup might include tissue diagnosis, endoscopic ultrasound, or nuclear medicine PET-CT. 3. Pancreas divisum. 4. Lumbar spondylosis and degenerative disc disease. 5. Despite efforts by the technologist and patient, motion artifact is present on today's exam and could not be eliminated. This reduces exam sensitivity and specificity.   12/22/2019 Procedure   Upper Endoscopy by Dr Rollin 12/22/19  IMPRESSION - One lymph node was visualized and measured in the porta hepatis region. Fine needle aspiration performed    12/22/2019 Initial Biopsy   A. LIVER, PORTA HEPATIS MASS, FINE NEEDLE 12/22/19 ASPIRATION:  FINAL MICROSCOPIC DIAGNOSIS:  - Malignant cells consistent with metastatic adenocarcinoma   01/01/2020 Initial Diagnosis   Intrahepatic cholangiocarcinoma (HCC)   01/08/2020 Initial Biopsy   FINAL MICROSCOPIC DIAGNOSIS:   A. LIVER, LEFT LOBE, BIOPSY:  - Metastatic adenocarcinoma, consistent with a colorectal primary.  See  comment      COMMENT:   Immunohistochemical stains show the tumor cells are positive for CK20  and CDX2 but negative for CK7, consistent with above interpretation.  Dr. 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   01/24/2020 Procedure   PAC placed 01/24/20   01/25/2020 -  Chemotherapy   first line FOLFOX starting 01/25/2020, Vextibix  added with C2 (02/06/20)   02/06/2020 - 02/06/2022 Chemotherapy   Patient is on Treatment Plan : COLORECTAL Panitumumab  q14d ([name] - Type Gene Only)     06/20/2020 Imaging   CT CAP  IMPRESSION: 1. Continued interval reduction in size and conspicuity of a subsolid  nodule of the peripheral left upper lobe. 2. Unchanged prominent  pretracheal and subcarinal lymph nodes. 3. Redemonstrated partially calcified low-attenuation liver masses, slightly decreased in size compared to prior examination. 4. Slight interval decrease in size of a portacaval lymph node or conglomerate and retroperitoneal lymph nodes. 5. Findings are consistent with continued treatment response of nodal, pulmonary, and hepatic metastatic disease. 6. Coronary artery disease.   Aortic Atherosclerosis (ICD10-I70.0).     10/09/2020 Imaging   IMPRESSION: 1. Slight decrease in size of dominant liver mass with smaller liver mass within 1-2 mm of prior measurement. 2. Stable appearance of celiac lymph and retroperitoneal lymph nodes. Dominant node with calcification in the gastrohepatic ligament as described. 3. Continued decrease in size of LEFT upper lobe nodule. 4. Three-vessel coronary artery calcification. 5. Aortic atherosclerosis.   03/25/2022 - 07/16/2023 Chemotherapy   Patient is on Treatment Plan : COLORECTAL FOLFIRI + Bevacizumab  q14d     08/31/2022 Imaging    IMPRESSION: 1. Unchanged, densely calcified liver lesions, consistent with treated metastatic disease. 2. Unchanged, irregular subpleural opacity of the peripheral left upper lobe. 3. Unchanged subcentimeter gastrohepatic ligament lymph node. 4. No evidence of new metastatic disease in the chest, abdomen, or pelvis. 5. Status post hysterectomy. 6. Coronary artery disease. 7. Aortic valve calcifications. Correlate for echocardiographic evidence of aortic valve dysfunction.     06/22/2023 Imaging   CT Chest, Abdomen, and pelvis with contrast  IMPRESSION: Centrally calcified liver lesions are again seen and slightly increased today. Prominent nodes in the upper retroperitoneum are overall similar. Attention on follow-up.   Stable branching left upper lobe lung nodule.   08/11/2023 -  Chemotherapy   Patient is on Treatment Plan : COLORECTAL FOLFIRI + Panitumumab  q14d       INTERVAL HISTORY: Lindsay [name] is a 68 y.o. female who returns for a follow up for continued management of metastatic colon cancer. She presents today to start Cycle 9, Day 1 of FOLFIRI plus Vectibix . She is unaccompanied for this visit.   Ms. Duskin reports she is tolerating treatment without any new symptoms. She reports her energy and appetite are overall stable. She does have some nausea without vomiting that improves with compazine . She has occasional constipation with straining. She has stable shortness of breath with exertion.  She denies any overt signs of bleeding except for occasional hematochezia when she strains.  She denies fevers, chills, sweats, chest pain or cough. She has no other complaints.    All other systems were reviewed with the patient and are negative.  MEDICAL HISTORY:  Past Medical History:  Diagnosis Date   Arthritis    Diabetes mellitus without complication (HCC)    Hypertension    met colon ca to liver 01/2020    SURGICAL HISTORY: Past Surgical History:  Procedure Laterality Date   ABDOMINAL HYSTERECTOMY     ANTERIOR AND POSTERIOR REPAIR WITH SACROSPINOUS FIXATION N/A 07/16/2016   Procedure: ANTERIOR AND POSTERIOR REPAIR WITH SACROSPINOUS FIXATION;  Surgeon: Curlene Agent, MD;  Location: [facility];  Service: Gynecology;  Laterality: N/A;   CYSTOSCOPY  07/16/2016   Procedure: CYSTOSCOPY;  Surgeon: Curlene Agent, MD;  Location: [facility];  Service: Gynecology;;   ESOPHAGOGASTRODUODENOSCOPY (EGD) WITH PROPOFOL  N/A 12/22/2019   Procedure: ESOPHAGOGASTRODUODENOSCOPY (EGD) WITH PROPOFOL ;  Surgeon: [name], MD;  Location: [facility];  Service: Endoscopy;  Laterality: N/A;   FINE NEEDLE ASPIRATION N/A 12/22/2019   Procedure: FINE NEEDLE ASPIRATION (FNA) LINEAR;  Surgeon: [name], MD;  Location: [facility];  Service: Endoscopy;  Laterality: N/A;   IR IMAGING GUIDED PORT INSERTION  01/24/2020   IR REMOVAL TUN ACCESS W/ PORT W/O FL MOD SED  07/30/2023    LAPAROSCOPIC VAGINAL HYSTERECTOMY WITH SALPINGECTOMY Bilateral 07/16/2016   Procedure: LAPAROSCOPIC ASSISTED VAGINAL HYSTERECTOMY WITH SALPINGECTOMY;  Surgeon: Curlene Agent, MD;  Location: [facility];  Service: Gynecology;  Laterality: Bilateral;   LEFT HEART CATH AND CORONARY ANGIOGRAPHY N/A 05/07/2023   Procedure: LEFT HEART CATH AND CORONARY ANGIOGRAPHY;  Surgeon: Anner Alm ORN, MD;  Location: [facility] INVASIVE CV LAB;  Service: Cardiovascular;  Laterality: N/A;   MYOMECTOMY ABDOMINAL APPROACH     THYROID SURGERY     tyroid     UPPER ESOPHAGEAL ENDOSCOPIC ULTRASOUND (EUS) N/A 12/22/2019   Procedure: UPPER ESOPHAGEAL ENDOSCOPIC ULTRASOUND (EUS);  Surgeon: [name], MD;  Location: [facility];  Service: Endoscopy;  Laterality: N/A;    I have reviewed the social history and family history with the patient and they are unchanged from previous note.  ALLERGIES:  is allergic to oxaliplatin .  MEDICATIONS:  Current Outpatient Medications  Medication Sig Dispense Refill   aspirin  EC 81 MG tablet Take 1 tablet (81 mg total) by mouth daily. Swallow whole. 90 tablet 0   atorvastatin  (LIPITOR) 80 MG tablet Take 1 tablet (80 mg total) by mouth daily. 90 tablet 0   clindamycin  (CLEOCIN -T) 1 % lotion Apply topically as needed. 60 mL 0   clopidogrel  (PLAVIX ) 75 MG tablet Take 1 tablet (75 mg total) by mouth daily. 90 tablet 0   doxycycline  (VIBRA -TABS) 100 MG tablet Take 1 tablet (100 mg total) by mouth 2 (two) times daily. 60 tablet 3   ezetimibe  (ZETIA ) 10 MG tablet Take 1 tablet (10 mg total) by mouth daily. 90 tablet 3   isosorbide  mononitrate (IMDUR ) 30 MG 24 hr tablet Take 1 tablet (30 mg total) by mouth every evening. 90 tablet 3   magnesium  oxide (MAG-OX) 400 (240 Mg) MG tablet Take 1 tablet (400 mg total) by mouth 2 (two) times daily. 60 tablet 1   metoprolol  succinate (TOPROL  XL) 25 MG 24 hr tablet Take 1 tablet (25 mg total) by mouth daily. Hold if systolic BP (top number) is less than 100  and/or heart rate is less than 55 90 tablet 3   nitroGLYCERIN  (NITROSTAT ) 0.4 MG SL tablet Place 1 tablet (0.4 mg total) under the tongue every 5 (five) minutes as needed for chest pain. 75 tablet 3   POTASSIUM CHLORIDE  PO Take by mouth every other day.     ranolazine  (RANEXA ) 500 MG 12 hr tablet Take 1 tablet (500 mg total) by mouth 2 (two) times daily. 180 tablet 3   No current facility-administered medications for this visit.    PHYSICAL EXAMINATION: ECOG PERFORMANCE STATUS: 1 - Symptomatic but completely ambulatory  Vitals:   12/01/23 0932  BP: (!) 140/80  Pulse: 64  Resp: 13  Temp: (!) 97.5 F (36.4 C)  SpO2: 100%   Wt Readings from Last 3 Encounters:  12/01/23 176 lb 1.6 oz (79.9 kg)  11/17/23 177 lb 8 oz (80.5 kg)  11/16/23 175 lb 12.8 oz (79.7 kg)     GENERAL:alert, no distress and comfortable SKIN: skin color, texture, turgor are normal, no rashes or significant lesions. EYES: normal, Conjunctiva are pink and non-injected, sclera clear LUNGS: clear to auscultation and percussion with normal breathing effort HEART: regular rate & rhythm and no murmurs and no lower extremity edema ABDOMEN:abdomen soft, non-tender and normal bowel sounds Musculoskeletal:no  cyanosis of digits and no clubbing  NEURO: alert & oriented x 3 with fluent speech, no focal motor/sensory deficits  LABORATORY DATA:  I have reviewed the data as listed    Latest Ref Rng & Units 12/01/2023    8:44 AM 11/24/2023   12:36 PM 11/17/2023    8:54 AM  CBC  WBC 4.0 - 10.5 K/uL 4.9  5.6  5.4   Hemoglobin 12.0 - 15.0 g/dL 89.0  88.2  88.7   Hematocrit 36.0 - 46.0 % 33.3  35.4  34.2   Platelets 150 - 400 K/uL 239  287  275         Latest Ref Rng & Units 12/01/2023    8:44 AM 11/24/2023   12:36 PM 11/17/2023    8:54 AM  CMP  Glucose 70 - 99 mg/dL 851  884  866   BUN 8 - 23 mg/dL 8  7  8    Creatinine 0.44 - 1.00 mg/dL 9.34  9.31  9.39   Sodium 135 - 145 mmol/L 139  139  140   Potassium 3.5 - 5.1  mmol/L 3.6  3.8  3.4   Chloride 98 - 111 mmol/L 105  104  104   CO2 22 - 32 mmol/L 29  30  30    Calcium  8.9 - 10.3 mg/dL 9.1  9.5  9.2   Total Protein 6.5 - 8.1 g/dL 7.2  7.6  7.2   Total Bilirubin 0.0 - 1.2 mg/dL 0.3  0.3  0.3   Alkaline Phos 38 - 126 U/L 154  153  140   AST 15 - 41 U/L 37  18  16   ALT 0 - 44 U/L 57  29  19       RADIOGRAPHIC STUDIES: I have personally reviewed the radiological images as listed and agreed with the findings in the report. No results found.   ASSESSMENT AND PLAN: Lindsay [name] is a 68 y.o. female who presents to the clinic for evaluation of metastatic colon cancer.   #Metastatic colon cancer: --See oncologic history as above --Recent CT scan from April 2025 showed mild disease progression in the liver, recommendation was to change FOLFIRI plus Bevacizumab  to FOLFIRI plus Vectibix .  --Started cycle 1, Day 1 of FOLFIRI plus Vectibix  on 08/11/2023.  PLAN: --Due for cycle 9, day 1 of FOLFIRI plus Vectibix  today. --Labs from today were reviewed and adequate for treatment. WBC 4.9, Hgb 10.9, Plt 239, Creatinine normal, mild ALT elevation measuring 57. CEA is rising to 53.84. Will discuss with Dr. Lanny regarding timing of CT scan with rising CEA marker.  --Proceed with treatment today without any dose modifications --RTC in 2 weeks with labs and follow up prior to Cycle 10, Day 1.   #Hypomagnesemia:  --Magnesium  level is 1.6 today --Patient will receive 2 gm of IV mag today --Continue on PO magnesium  400 mg BID  #Nausea: --Improved with compazine . Refill sent today.   No orders of the defined types were placed in this encounter.  All questions were answered. The patient knows to call the clinic with any problems, questions or concerns. No barriers to learning was detected.   I have spent a total of 30 minutes minutes of face-to-face and non-face-to-face time, preparing to see the patient, performing a medically appropriate examination, counseling  and educating the patient, ordering medications/tests/procedures, documenting clinical information in the electronic health record, independently interpreting results and communicating results to the patient, and care coordination.   Walt Geathers  PA-C Dept of Hematology and Oncology [facility] at [facility] Phone: [phone]

## 2023-12-03 ENCOUNTER — Inpatient Hospital Stay: Payer: Medicare (Managed Care)

## 2023-12-03 VITALS — BP 124/70 | HR 80 | Temp 98.9°F | Resp 18

## 2023-12-03 DIAGNOSIS — Z5112 Encounter for antineoplastic immunotherapy: Secondary | ICD-10-CM | POA: Diagnosis not present

## 2023-12-03 DIAGNOSIS — C221 Intrahepatic bile duct carcinoma: Secondary | ICD-10-CM

## 2023-12-08 ENCOUNTER — Inpatient Hospital Stay: Payer: Medicare (Managed Care) | Attending: Hematology

## 2023-12-08 DIAGNOSIS — C787 Secondary malignant neoplasm of liver and intrahepatic bile duct: Secondary | ICD-10-CM | POA: Insufficient documentation

## 2023-12-08 DIAGNOSIS — C19 Malignant neoplasm of rectosigmoid junction: Secondary | ICD-10-CM | POA: Insufficient documentation

## 2023-12-08 DIAGNOSIS — Z79899 Other long term (current) drug therapy: Secondary | ICD-10-CM | POA: Insufficient documentation

## 2023-12-08 DIAGNOSIS — Z5112 Encounter for antineoplastic immunotherapy: Secondary | ICD-10-CM | POA: Insufficient documentation

## 2023-12-08 DIAGNOSIS — Z452 Encounter for adjustment and management of vascular access device: Secondary | ICD-10-CM | POA: Insufficient documentation

## 2023-12-08 DIAGNOSIS — Z90710 Acquired absence of both cervix and uterus: Secondary | ICD-10-CM | POA: Insufficient documentation

## 2023-12-08 DIAGNOSIS — C7800 Secondary malignant neoplasm of unspecified lung: Secondary | ICD-10-CM | POA: Insufficient documentation

## 2023-12-08 DIAGNOSIS — G620 Drug-induced polyneuropathy: Secondary | ICD-10-CM | POA: Insufficient documentation

## 2023-12-08 DIAGNOSIS — Z5111 Encounter for antineoplastic chemotherapy: Secondary | ICD-10-CM | POA: Insufficient documentation

## 2023-12-08 DIAGNOSIS — T451X5A Adverse effect of antineoplastic and immunosuppressive drugs, initial encounter: Secondary | ICD-10-CM | POA: Insufficient documentation

## 2023-12-14 NOTE — Assessment & Plan Note (Signed)
-  overall mild and stable

## 2023-12-14 NOTE — Assessment & Plan Note (Signed)
 MSS, [name]/[name] wildtype -diagnosed 12/2019 by porta hepatis LN biopsy during EUS for work up of abdominal pain and liver lesions seen on US  and MRI. Liver biopsy 01/08/20 confirmed metastasis from primary colorectal cancer. PET scan showed hypermetabolism to known liver mets, diffuse thoracic and abdominal lymphadenopathy, a 1.8 cm LUL pulmonary nodule, and splenic flexure of colon. -treated with first line FOLFOX 01/25/20 - 10/02/20, Vectibix  added with C2. Oxali discontinued after C18 due to reaction. -switched to Xeloda  10/21/20, dose adjusted due to significant skin toxicity -due to cancer progression, treatment has been changed to FOLFIRI and bevacizumab  on 03/25/22 -will repeat CT scan after next cycle chemo  -Restaging CT scan from 06/16/2022 showed stable to mild decrease in size of treated liver metastasis. No new lesions -Her restaging CT scan on August 31, 2022 showed stable liver metastasis, and a stable 7 mm left lung nodule, indeterminate.  -restaging CT in 06/2023 showed mild disease progression in liver, I changed Beva to vectibix   - Restaging CT scan on September 27, 2023 showed stable disease, will continue current treatment.

## 2023-12-15 ENCOUNTER — Inpatient Hospital Stay: Payer: Medicare (Managed Care)

## 2023-12-15 ENCOUNTER — Inpatient Hospital Stay (HOSPITAL_BASED_OUTPATIENT_CLINIC_OR_DEPARTMENT_OTHER): Payer: Medicare (Managed Care) | Admitting: Hematology

## 2023-12-15 ENCOUNTER — Other Ambulatory Visit: Payer: Self-pay

## 2023-12-15 VITALS — BP 130/70 | HR 62 | Temp 97.8°F | Resp 17 | Ht 66.0 in | Wt 175.5 lb

## 2023-12-15 DIAGNOSIS — C221 Intrahepatic bile duct carcinoma: Secondary | ICD-10-CM

## 2023-12-15 DIAGNOSIS — G620 Drug-induced polyneuropathy: Secondary | ICD-10-CM | POA: Diagnosis not present

## 2023-12-15 DIAGNOSIS — C189 Malignant neoplasm of colon, unspecified: Secondary | ICD-10-CM

## 2023-12-15 DIAGNOSIS — C787 Secondary malignant neoplasm of liver and intrahepatic bile duct: Secondary | ICD-10-CM | POA: Diagnosis present

## 2023-12-15 DIAGNOSIS — Z79899 Other long term (current) drug therapy: Secondary | ICD-10-CM | POA: Diagnosis not present

## 2023-12-15 DIAGNOSIS — Z5112 Encounter for antineoplastic immunotherapy: Secondary | ICD-10-CM | POA: Diagnosis present

## 2023-12-15 DIAGNOSIS — C7800 Secondary malignant neoplasm of unspecified lung: Secondary | ICD-10-CM | POA: Diagnosis not present

## 2023-12-15 DIAGNOSIS — T451X5A Adverse effect of antineoplastic and immunosuppressive drugs, initial encounter: Secondary | ICD-10-CM | POA: Diagnosis not present

## 2023-12-15 DIAGNOSIS — Z452 Encounter for adjustment and management of vascular access device: Secondary | ICD-10-CM | POA: Diagnosis not present

## 2023-12-15 DIAGNOSIS — Z90710 Acquired absence of both cervix and uterus: Secondary | ICD-10-CM | POA: Diagnosis not present

## 2023-12-15 DIAGNOSIS — Z5111 Encounter for antineoplastic chemotherapy: Secondary | ICD-10-CM | POA: Diagnosis not present

## 2023-12-15 DIAGNOSIS — C19 Malignant neoplasm of rectosigmoid junction: Secondary | ICD-10-CM | POA: Diagnosis present

## 2023-12-15 LAB — CMP (CANCER CENTER ONLY)
ALT: 20 U/L (ref 0–44)
AST: 18 U/L (ref 15–41)
Albumin: 3.9 g/dL (ref 3.5–5.0)
Alkaline Phosphatase: 160 U/L — ABNORMAL HIGH (ref 38–126)
Anion gap: 4 — ABNORMAL LOW (ref 5–15)
BUN: 7 mg/dL — ABNORMAL LOW (ref 8–23)
CO2: 31 mmol/L (ref 22–32)
Calcium: 9.5 mg/dL (ref 8.9–10.3)
Chloride: 103 mmol/L (ref 98–111)
Creatinine: 0.62 mg/dL (ref 0.44–1.00)
GFR, Estimated: >60 mL/min (ref >60)
Glucose, Bld: 117 mg/dL — ABNORMAL HIGH (ref 70–99)
Potassium: 4 mmol/L (ref 3.5–5.1)
Sodium: 138 mmol/L (ref 135–145)
Total Bilirubin: 0.4 mg/dL (ref 0.0–1.2)
Total Protein: 7.2 g/dL (ref 6.5–8.1)

## 2023-12-15 LAB — CBC WITH DIFFERENTIAL (CANCER CENTER ONLY)
Abs Immature Granulocytes: 0.01 K/uL (ref 0.00–0.07)
Basophils Absolute: 0 K/uL (ref 0.0–0.1)
Basophils Relative: 1 %
Eosinophils Absolute: 0.2 K/uL (ref 0.0–0.5)
Eosinophils Relative: 3 %
HCT: 32.6 % — ABNORMAL LOW (ref 36.0–46.0)
Hemoglobin: 10.6 g/dL — ABNORMAL LOW (ref 12.0–15.0)
Immature Granulocytes: 0 %
Lymphocytes Relative: 21 %
Lymphs Abs: 1 K/uL (ref 0.7–4.0)
MCH: 28.7 pg (ref 26.0–34.0)
MCHC: 32.5 g/dL (ref 30.0–36.0)
MCV: 88.3 fL (ref 80.0–100.0)
Monocytes Absolute: 0.5 K/uL (ref 0.1–1.0)
Monocytes Relative: 11 %
Neutro Abs: 3.1 K/uL (ref 1.7–7.7)
Neutrophils Relative %: 64 %
Platelet Count: 288 K/uL (ref 150–400)
RBC: 3.69 MIL/uL — ABNORMAL LOW (ref 3.87–5.11)
RDW: 17.2 % — ABNORMAL HIGH (ref 11.5–15.5)
WBC Count: 4.8 K/uL (ref 4.0–10.5)
nRBC: 0 % (ref 0.0–0.2)

## 2023-12-15 LAB — CEA (ACCESS): CEA (CHCC): 77.45 ng/mL — ABNORMAL HIGH (ref 0.00–5.00)

## 2023-12-15 LAB — MAGNESIUM: Magnesium: 1.9 mg/dL (ref 1.7–2.4)

## 2023-12-15 MED ORDER — MAGNESIUM SULFATE 2 GM/50ML IV SOLN
2.0000 g | Freq: Once | INTRAVENOUS | Status: AC
  Administered 2023-12-15: 2 g via INTRAVENOUS
  Filled 2023-12-15: qty 50

## 2023-12-15 MED ORDER — SODIUM CHLORIDE 0.9% FLUSH: 10.0000 mL | INTRAVENOUS | Status: DC | PRN

## 2023-12-15 MED ORDER — SODIUM CHLORIDE 0.9 % IV SOLN
400.0000 mg/m2 | Freq: Once | INTRAVENOUS | Status: AC
  Administered 2023-12-15: 768 mg via INTRAVENOUS
  Filled 2023-12-15: qty 25

## 2023-12-15 MED ORDER — SODIUM CHLORIDE 0.9 % IV SOLN
150.0000 mg/m2 | Freq: Once | INTRAVENOUS | Status: AC
  Administered 2023-12-15: 300 mg via INTRAVENOUS
  Filled 2023-12-15: qty 15

## 2023-12-15 MED ORDER — ATROPINE SULFATE 1 MG/ML IV SOLN
0.5000 mg | Freq: Once | INTRAVENOUS | Status: AC | PRN
  Administered 2023-12-15: 0.5 mg via INTRAVENOUS
  Filled 2023-12-15: qty 1

## 2023-12-15 MED ORDER — PALONOSETRON HCL INJECTION 0.25 MG/5ML
0.2500 mg | Freq: Once | INTRAVENOUS | Status: AC
  Administered 2023-12-15: 0.25 mg via INTRAVENOUS
  Filled 2023-12-15: qty 5

## 2023-12-15 MED ORDER — DEXAMETHASONE SODIUM PHOSPHATE 10 MG/ML IJ SOLN
10.0000 mg | Freq: Once | INTRAMUSCULAR | Status: AC
  Administered 2023-12-15: 10 mg via INTRAVENOUS
  Filled 2023-12-15: qty 1

## 2023-12-15 MED ORDER — SODIUM CHLORIDE 0.9 % IV SOLN
2000.0000 mg/m2 | INTRAVENOUS | Status: DC
  Administered 2023-12-15: 3500 mg via INTRAVENOUS
  Filled 2023-12-15: qty 70

## 2023-12-15 MED ORDER — FAMOTIDINE IN NACL 20-0.9 MG/50ML-% IV SOLN
20.0000 mg | Freq: Once | INTRAVENOUS | Status: AC
  Administered 2023-12-15: 20 mg via INTRAVENOUS
  Filled 2023-12-15: qty 50

## 2023-12-15 MED ORDER — SODIUM CHLORIDE 0.9 % IV SOLN
6.0000 mg/kg | Freq: Once | INTRAVENOUS | Status: AC
  Administered 2023-12-15: 500 mg via INTRAVENOUS
  Filled 2023-12-15: qty 5

## 2023-12-15 MED ORDER — SODIUM CHLORIDE 0.9 % IV SOLN: INTRAVENOUS | Status: DC

## 2023-12-15 NOTE — Progress Notes (Signed)
 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 Liver biopsy 01/08/20 confirmed metastasis from primary colorectal cancer. PET scan showed hypermetabolism to known liver mets, diffuse thoracic and abdominal lymphadenopathy, a 1.8 cm LUL pulmonary nodule, and splenic flexure of colon. -treated with first line FOLFOX 01/25/20 - 10/02/20, Vectibix  added with C2. Oxali discontinued after C18 due to reaction. -switched to Xeloda  10/21/20, dose adjusted due to significant skin toxicity -due to cancer progression, treatment has been changed to FOLFIRI and bevacizumab  on 03/25/22 -will repeat CT scan after next cycle chemo  -Restaging CT scan from 06/16/2022 showed stable to mild decrease in size of treated liver metastasis. No new lesions -Her restaging CT scan on August 31, 2022 showed stable liver metastasis, and a stable 7 mm left lung nodule, indeterminate.  -restaging CT in 06/2023 showed mild disease progression in liver, I changed Beva to vectibix   - Restaging CT scan on September 27, 2023 showed stable disease, will continue current treatment.  Assessment & Plan Metastatic colon cancer Metastatic colon cancer is well-managed on the current treatment regimen. She has been on second-line therapy with Vectibix   since April 2025 and chemotherapy since January 2024. Tumor markers have slightly increased but remain stable. The disease is responding to treatment and is not aggressive. She is tolerating treatment well with no significant side effects, except for a skin burn around the neck, likely related to a previous port. - Order CT scan at the end of October 2025 - Continue current chemotherapy regimen - Monitor tumor markers - Discuss potential for spacing out scans to November if stable  PICC line management for chemotherapy She has a PICC line placed in May 2025 after removal of a port due to infection. The PICC line is functioning well, and she is comfortable with it. The risk of infection is similar between PICC lines and ports, provided proper care is taken. She is considering whether to switch back to a port, and the decision is left to her preference. - Continue with PICC line management - Discuss with her the option of switching back to a port if desired  Plan - Patient is clinically doing very well, and tolerating treatment well. - Lab reviewed, adequate for treatment, will proceed to chemo today and continue every 2 weeks - Follow-up in 2 weeks, plan to repeat CT scan in November - Okay to stop oral potassium, and reduce oral magnesium  to once daily   SUMMARY OF ONCOLOGIC HISTORY: Oncology History  metastatic colon cancer to liver  06/20/2019 Procedure   Colonoscopy by Dr Wilhelmenia 06/20/19  IMPRESSION -Seven 3 to 10 mm polyps in the sigmoid colon, in the transverse colon and in the escending colon, removed with a cold snare. Resected and retrireved.  -One 20mm polyp in the descending colon. Biopsies. Tattoes.  -Mediaum sized lipoma in the ascending  colon.   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 See Comment B. Colon, Ascending, polyp, Polypectomy:  -TUBULAR ADENOMA -No high grade dysplasia or  malignancy.  C. Colon, TRansverse, Polyo, polectomy:  -TUBULAR ADENOMA -No high grade dysplasia or malignancy.  D. Colon, Sigmoid, Polyp, Polypectomy:  -HYPERPLASTIC POLYP   11/07/2019 Imaging   US  Abdomen 11/07/19  IMPRESSION: 1. Two solid masses in the liver are nonspecific. Recommend MRI abdomen with and without contrast for further evaluation.   12/15/2019 Imaging   MRI Abdomen 12/15/19  IMPRESSION: 1. There are two large masses in the liver with appearance favoring metastatic disease or hepatocellular carcinoma or cholangiocarcinoma. A benign etiology is highly unlikely given the enhancement pattern and associated adenopathy. 2. Considerable porta hepatis and retroperitoneal adenopathy. Some of the confluent porta hepatis tumor is potentially infiltrative and abuts the pancreatic body along its right upper margin, making it difficult to completely exclude the possibility of pancreatic adenocarcinoma primary. Possibilities helpful in further workup might include tissue diagnosis, endoscopic ultrasound, or nuclear medicine PET-CT. 3. Pancreas divisum. 4. Lumbar spondylosis and degenerative disc disease. 5. Despite efforts by the technologist and patient, motion artifact is present on today's exam and could not be eliminated. This reduces exam sensitivity and specificity.   12/22/2019 Procedure   Upper Endoscopy by Dr Rollin 12/22/19  IMPRESSION - One lymph node was visualized and measured in the porta hepatis region. Fine needle aspiration performed    12/22/2019 Initial Biopsy   A. LIVER, PORTA HEPATIS MASS, FINE NEEDLE 12/22/19 ASPIRATION:  FINAL MICROSCOPIC DIAGNOSIS:  - Malignant cells consistent with metastatic adenocarcinoma   01/01/2020 Initial Diagnosis   Intrahepatic cholangiocarcinoma (HCC)   01/08/2020 Initial Biopsy   FINAL MICROSCOPIC DIAGNOSIS:   A. LIVER, LEFT LOBE, BIOPSY:  - Metastatic adenocarcinoma, consistent with a colorectal primary.  See   comment      COMMENT:   Immunohistochemical stains show the tumor cells are positive for CK20  and CDX2 but negative for CK7, consistent with above interpretation.  Dr. 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   01/24/2020 Procedure   PAC placed 01/24/20   01/25/2020 -  Chemotherapy   first line FOLFOX starting 01/25/2020, Vextibix  added with C2 (02/06/20)   02/06/2020 - 02/06/2022 Chemotherapy   Patient is on Treatment Plan : COLORECTAL Panitumumab  q14d ([name] Wild - Type Gene Only)     06/20/2020 Imaging   CT CAP  IMPRESSION: 1. Continued interval reduction in size and conspicuity of a subsolid nodule of the peripheral left upper lobe. 2. Unchanged prominent pretracheal and subcarinal lymph nodes. 3. Redemonstrated partially calcified low-attenuation liver masses, slightly decreased in size compared to prior examination. 4. Slight interval decrease in size of a portacaval lymph node or conglomerate and retroperitoneal lymph nodes. 5. Findings are consistent with continued treatment response of nodal, pulmonary, and hepatic metastatic disease. 6. Coronary artery disease.   Aortic Atherosclerosis (ICD10-I70.0).     10/09/2020 Imaging   IMPRESSION: 1. Slight decrease in size of dominant liver mass with smaller liver mass within 1-2 mm of prior measurement. 2. Stable appearance of celiac lymph and retroperitoneal lymph nodes. Dominant node with calcification in the gastrohepatic ligament as described. 3. Continued decrease in size of LEFT upper lobe nodule. 4. Three-vessel coronary artery calcification. 5. Aortic atherosclerosis.   03/25/2022 -  07/16/2023 Chemotherapy   Patient is on Treatment Plan : COLORECTAL FOLFIRI + Bevacizumab  q14d     08/31/2022 Imaging    IMPRESSION: 1. Unchanged, densely calcified liver lesions, consistent  with treated metastatic disease. 2. Unchanged, irregular subpleural opacity of the peripheral left upper lobe. 3. Unchanged subcentimeter gastrohepatic ligament lymph node. 4. No evidence of new metastatic disease in the chest, abdomen, or pelvis. 5. Status post hysterectomy. 6. Coronary artery disease. 7. Aortic valve calcifications. Correlate for echocardiographic evidence of aortic valve dysfunction.     06/22/2023 Imaging   CT Chest, Abdomen, and pelvis with contrast  IMPRESSION: Centrally calcified liver lesions are again seen and slightly increased today. Prominent nodes in the upper retroperitoneum are overall similar. Attention on follow-up.   Stable branching left upper lobe lung nodule.   08/11/2023 -  Chemotherapy   Patient is on Treatment Plan : COLORECTAL FOLFIRI + Panitumumab  q14d        Discussed the use of AI scribe software for clinical note transcription with the patient, who gave verbal consent to proceed.  History of Present Illness Lindsay [name] is a 68 year old female with metastatic colon cancer who presents for follow-up.  She has been on her current chemotherapy regimen since January 2024, with a switch to Vectibix  in April 2025. Her tumor marker has increased from 36 to 53. Her last CT scan was on September 27, 2023, with a total of approximately 13 CT scans performed.  She experiences no pain, discomfort, or breathing issues. There is no diarrhea, but possible constipation. Her energy level remains stable, allowing her to perform all desired activities.  She has a skin burn around her neck, which feels tight but is not painful.  A PICC line was placed in May 2025 after her port was removed due to infection. She is comfortable with the PICC line and does not express a preference for a port.  She is currently taking magnesium  twice daily and potassium every other day. Her potassium level is 4, and magnesium  is 1.9, which is an improvement.     All other  systems were reviewed with the patient and are negative.  MEDICAL HISTORY:  Past Medical History:  Diagnosis Date   Arthritis    Diabetes mellitus without complication (HCC)    Hypertension    met colon ca to liver 01/2020    SURGICAL HISTORY: Past Surgical History:  Procedure Laterality Date   ABDOMINAL HYSTERECTOMY     ANTERIOR AND POSTERIOR REPAIR WITH SACROSPINOUS FIXATION N/A 07/16/2016   Procedure: ANTERIOR AND POSTERIOR REPAIR WITH SACROSPINOUS FIXATION;  Surgeon: Curlene Agent, MD;  Location: [facility];  Service: Gynecology;  Laterality: N/A;   CYSTOSCOPY  07/16/2016   Procedure: CYSTOSCOPY;  Surgeon: Curlene Agent, MD;  Location: [facility];  Service: Gynecology;;   ESOPHAGOGASTRODUODENOSCOPY (EGD) WITH PROPOFOL  N/A 12/22/2019   Procedure: ESOPHAGOGASTRODUODENOSCOPY (EGD) WITH PROPOFOL ;  Surgeon: [name], MD;  Location: [facility];  Service: Endoscopy;  Laterality: N/A;   FINE NEEDLE ASPIRATION N/A 12/22/2019   Procedure: FINE NEEDLE ASPIRATION (FNA) LINEAR;  Surgeon: [name], MD;  Location: [facility];  Service: Endoscopy;  Laterality: N/A;   IR IMAGING GUIDED PORT INSERTION  01/24/2020   IR REMOVAL TUN ACCESS W/ PORT W/O FL MOD SED  07/30/2023   LAPAROSCOPIC VAGINAL HYSTERECTOMY WITH SALPINGECTOMY Bilateral 07/16/2016   Procedure: LAPAROSCOPIC ASSISTED VAGINAL HYSTERECTOMY WITH SALPINGECTOMY;  Surgeon: Curlene Agent, MD;  Location: [facility];  Service: Gynecology;  Laterality: Bilateral;   LEFT HEART  CATH AND CORONARY ANGIOGRAPHY N/A 05/07/2023   Procedure: LEFT HEART CATH AND CORONARY ANGIOGRAPHY;  Surgeon: Anner Alm ORN, MD;  Location: [facility] INVASIVE CV [facility];  Service: Cardiovascular;  Laterality: N/A;   MYOMECTOMY ABDOMINAL APPROACH     THYROID SURGERY     tyroid     UPPER ESOPHAGEAL ENDOSCOPIC ULTRASOUND (EUS) N/A 12/22/2019   Procedure: UPPER ESOPHAGEAL ENDOSCOPIC ULTRASOUND (EUS);  Surgeon: [name], MD;  Location: [facility];  Service: Endoscopy;   Laterality: N/A;    I have reviewed the social history and family history with the patient and they are unchanged from previous note.  ALLERGIES:  is allergic to oxaliplatin .  MEDICATIONS:  Current Outpatient Medications  Medication Sig Dispense Refill   aspirin  EC 81 MG tablet Take 1 tablet (81 mg total) by mouth daily. Swallow whole. 90 tablet 0   atorvastatin  (LIPITOR) 80 MG tablet Take 1 tablet (80 mg total) by mouth daily. 90 tablet 0   clindamycin  (CLEOCIN -T) 1 % lotion Apply topically as needed. 60 mL 0   clopidogrel  (PLAVIX ) 75 MG tablet Take 1 tablet (75 mg total) by mouth daily. 90 tablet 0   doxycycline  (VIBRA -TABS) 100 MG tablet Take 1 tablet (100 mg total) by mouth 2 (two) times daily. 60 tablet 3   ezetimibe  (ZETIA ) 10 MG tablet Take 1 tablet (10 mg total) by mouth daily. 90 tablet 3   isosorbide  mononitrate (IMDUR ) 30 MG 24 hr tablet Take 1 tablet (30 mg total) by mouth every evening. 90 tablet 3   magnesium  oxide (MAG-OX) 400 (240 Mg) MG tablet Take 1 tablet (400 mg total) by mouth 2 (two) times daily. 60 tablet 1   metoprolol  succinate (TOPROL  XL) 25 MG 24 hr tablet Take 1 tablet (25 mg total) by mouth daily. Hold if systolic BP (top number) is less than 100 and/or heart rate is less than 55 90 tablet 3   nitroGLYCERIN  (NITROSTAT ) 0.4 MG SL tablet Place 1 tablet (0.4 mg total) under the tongue every 5 (five) minutes as needed for chest pain. 75 tablet 3   POTASSIUM CHLORIDE  PO Take by mouth every other day.     prochlorperazine  (COMPAZINE ) 10 MG tablet Take 1 tablet (10 mg total) by mouth every 6 (six) hours as needed for nausea or vomiting. 90 tablet 0   ranolazine  (RANEXA ) 500 MG 12 hr tablet Take 1 tablet (500 mg total) by mouth 2 (two) times daily. 180 tablet 3   No current facility-administered medications for this visit.    PHYSICAL EXAMINATION: ECOG PERFORMANCE STATUS: 1 - Symptomatic but completely ambulatory  Vitals:   12/15/23 1140  BP: 130/70  Pulse: 62   Resp: 17  Temp: 97.8 F (36.6 C)  SpO2: 99%   Wt Readings from Last 3 Encounters:  12/15/23 175 lb 8 oz (79.6 kg)  12/01/23 176 lb 1.6 oz (79.9 kg)  11/17/23 177 lb 8 oz (80.5 kg)     GENERAL:alert, no distress and comfortable SKIN: skin color, texture, turgor are normal, no rashes or significant lesions except hyperpigmentation in right neck and hands EYES: normal, Conjunctiva are pink and non-injected, sclera clear NECK: supple, thyroid normal size, non-tender, without nodularity LYMPH:  no palpable lymphadenopathy in the cervical, axillary  LUNGS: clear to auscultation and percussion with normal breathing effort HEART: regular rate & rhythm and no murmurs and no lower extremity edema ABDOMEN:abdomen soft, non-tender and normal bowel sounds Musculoskeletal:no cyanosis of digits and no clubbing  NEURO: alert & oriented x 3 with  fluent speech, no focal motor/sensory deficits  Physical Exam   LABORATORY DATA:  I have reviewed the data as listed    Latest Ref Rng & Units 12/15/2023   11:02 AM 12/01/2023    8:44 AM 11/24/2023   12:36 PM  CBC  WBC 4.0 - 10.5 K/uL 4.8  4.9  5.6   Hemoglobin 12.0 - 15.0 g/dL 89.3  89.0  88.2   Hematocrit 36.0 - 46.0 % 32.6  33.3  35.4   Platelets 150 - 400 K/uL 288  239  287         Latest Ref Rng & Units 12/15/2023   11:02 AM 12/01/2023    8:44 AM 11/24/2023   12:36 PM  CMP  Glucose 70 - 99 mg/dL 882  851  884   BUN 8 - 23 mg/dL 7  8  7    Creatinine 0.44 - 1.00 mg/dL 9.37  9.34  9.31   Sodium 135 - 145 mmol/L 138  139  139   Potassium 3.5 - 5.1 mmol/L 4.0  3.6  3.8   Chloride 98 - 111 mmol/L 103  105  104   CO2 22 - 32 mmol/L 31  29  30    Calcium  8.9 - 10.3 mg/dL 9.5  9.1  9.5   Total Protein 6.5 - 8.1 g/dL 7.2  7.2  7.6   Total Bilirubin 0.0 - 1.2 mg/dL 0.4  0.3  0.3   Alkaline Phos 38 - 126 U/L 160  154  153   AST 15 - 41 U/L 18  37  18   ALT 0 - 44 U/L 20  57  29       RADIOGRAPHIC STUDIES: I have personally reviewed the  radiological images as listed and agreed with the findings in the report. No results found.    No orders of the defined types were placed in this encounter.  All questions were answered. The patient knows to call the clinic with any problems, questions or concerns. No barriers to learning was detected. The total time spent in the appointment was 25 minutes, including review of chart and various tests results, discussions about plan of care and coordination of care plan     Onita Mattock, MD 12/15/2023

## 2023-12-15 NOTE — Patient Instructions (Signed)
 [facility] - A DEPT OF [facility]  Discharge Instructions: Thank you for choosing [facility] to provide your oncology and hematology care.   If you have a lab appointment with the Cancer Center, please go directly to the Cancer Center and check in at the registration area.   Wear comfortable clothing and clothing appropriate for easy access to any Portacath or PICC line.   We strive to give you quality time with your provider. You may need to reschedule your appointment if you arrive late (15 or more minutes).  Arriving late affects you and other patients whose appointments are after yours.  Also, if you miss three or more appointments without notifying the office, you may be dismissed from the clinic at the provider's discretion.      For prescription refill requests, have your pharmacy contact our office and allow 72 hours for refills to be completed.    Today you received the following chemotherapy and/or immunotherapy agents vectibix , irinotecan , leucovorin , adrucil , and magnesium       To help prevent nausea and vomiting after your treatment, we encourage you to take your nausea medication as directed.  BELOW ARE SYMPTOMS THAT SHOULD BE REPORTED IMMEDIATELY: *FEVER GREATER THAN 100.4 F (38 C) OR HIGHER *CHILLS OR SWEATING *NAUSEA AND VOMITING THAT IS NOT CONTROLLED WITH YOUR NAUSEA MEDICATION *UNUSUAL SHORTNESS OF BREATH *UNUSUAL BRUISING OR BLEEDING *URINARY PROBLEMS (pain or burning when urinating, or frequent urination) *BOWEL PROBLEMS (unusual diarrhea, constipation, pain near the anus) TENDERNESS IN MOUTH AND THROAT WITH OR WITHOUT PRESENCE OF ULCERS (sore throat, sores in mouth, or a toothache) UNUSUAL RASH, SWELLING OR PAIN  UNUSUAL VAGINAL DISCHARGE OR ITCHING   Items with * indicate a potential emergency and should be followed up as soon as possible or go to the Emergency Department if any problems should occur.  Please show  the CHEMOTHERAPY ALERT CARD or IMMUNOTHERAPY ALERT CARD at check-in to the Emergency Department and triage nurse.  Should you have questions after your visit or need to cancel or reschedule your appointment, please contact [facility] - A DEPT OF [facility]  Dept: [phone]  and follow the prompts.  Office hours are 8:00 a.m. to 4:30 p.m. Monday - Friday. Please note that voicemails left after 4:00 p.m. may not be returned until the following business day.  We are closed weekends and major holidays. You have access to a nurse at all times for urgent questions. Please call the main number to the clinic Dept: [phone] and follow the prompts.   For any non-urgent questions, you may also contact your provider using MyChart. We now offer e-Visits for anyone 77 and older to request care online for non-urgent symptoms. For details visit [email or web address].   Also download the MyChart app! Go to the app store, search MyChart, open the app, select [facility], and log in with your MyChart username and password.

## 2023-12-17 ENCOUNTER — Inpatient Hospital Stay: Payer: Medicare (Managed Care)

## 2023-12-20 ENCOUNTER — Telehealth: Payer: Self-pay | Admitting: Hematology

## 2023-12-20 NOTE — Telephone Encounter (Signed)
 Lindsay [name] called and stated that she needed to be scheduled for her Picc line dressing change. She is now scheduled and aware of all appointment details.

## 2023-12-21 ENCOUNTER — Telehealth: Payer: Self-pay | Admitting: Hematology

## 2023-12-21 NOTE — Telephone Encounter (Signed)
 I contacted Lindsay [name] and she is aware of her re-scheduled appointment for tomorrow at 12pm.

## 2023-12-22 ENCOUNTER — Inpatient Hospital Stay: Payer: Medicare (Managed Care)

## 2023-12-29 ENCOUNTER — Inpatient Hospital Stay: Payer: Medicare (Managed Care)

## 2023-12-29 ENCOUNTER — Inpatient Hospital Stay (HOSPITAL_BASED_OUTPATIENT_CLINIC_OR_DEPARTMENT_OTHER): Payer: Medicare (Managed Care) | Admitting: Hematology

## 2023-12-29 VITALS — BP 122/70 | HR 80 | Temp 97.2°F | Resp 14 | Ht 66.0 in | Wt 175.2 lb

## 2023-12-29 DIAGNOSIS — C221 Intrahepatic bile duct carcinoma: Secondary | ICD-10-CM

## 2023-12-29 DIAGNOSIS — T451X5A Adverse effect of antineoplastic and immunosuppressive drugs, initial encounter: Secondary | ICD-10-CM | POA: Diagnosis not present

## 2023-12-29 DIAGNOSIS — G620 Drug-induced polyneuropathy: Secondary | ICD-10-CM | POA: Diagnosis not present

## 2023-12-29 DIAGNOSIS — Z5112 Encounter for antineoplastic immunotherapy: Secondary | ICD-10-CM | POA: Diagnosis not present

## 2023-12-29 LAB — CBC WITH DIFFERENTIAL (CANCER CENTER ONLY)
Abs Immature Granulocytes: 0.01 K/uL (ref 0.00–0.07)
Basophils Absolute: 0 K/uL (ref 0.0–0.1)
Basophils Relative: 1 %
Eosinophils Absolute: 0.2 K/uL (ref 0.0–0.5)
Eosinophils Relative: 4 %
HCT: 34.1 % — ABNORMAL LOW (ref 36.0–46.0)
Hemoglobin: 11 g/dL — ABNORMAL LOW (ref 12.0–15.0)
Immature Granulocytes: 0 %
Lymphocytes Relative: 19 %
Lymphs Abs: 1 K/uL (ref 0.7–4.0)
MCH: 28.4 pg (ref 26.0–34.0)
MCHC: 32.3 g/dL (ref 30.0–36.0)
MCV: 87.9 fL (ref 80.0–100.0)
Monocytes Absolute: 0.6 K/uL (ref 0.1–1.0)
Monocytes Relative: 12 %
Neutro Abs: 3.4 K/uL (ref 1.7–7.7)
Neutrophils Relative %: 64 %
Platelet Count: 254 K/uL (ref 150–400)
RBC: 3.88 MIL/uL (ref 3.87–5.11)
RDW: 17 % — ABNORMAL HIGH (ref 11.5–15.5)
WBC Count: 5.2 K/uL (ref 4.0–10.5)
nRBC: 0 % (ref 0.0–0.2)

## 2023-12-29 LAB — CMP (CANCER CENTER ONLY)
ALT: 15 U/L (ref 0–44)
AST: 16 U/L (ref 15–41)
Albumin: 4 g/dL (ref 3.5–5.0)
Alkaline Phosphatase: 150 U/L — ABNORMAL HIGH (ref 38–126)
Anion gap: 6 (ref 5–15)
BUN: 8 mg/dL (ref 8–23)
CO2: 30 mmol/L (ref 22–32)
Calcium: 9.6 mg/dL (ref 8.9–10.3)
Chloride: 103 mmol/L (ref 98–111)
Creatinine: 0.6 mg/dL (ref 0.44–1.00)
GFR, Estimated: >60 mL/min (ref >60)
Glucose, Bld: 126 mg/dL — ABNORMAL HIGH (ref 70–99)
Potassium: 3.6 mmol/L (ref 3.5–5.1)
Sodium: 139 mmol/L (ref 135–145)
Total Bilirubin: 0.4 mg/dL (ref 0.0–1.2)
Total Protein: 7.1 g/dL (ref 6.5–8.1)

## 2023-12-29 LAB — MAGNESIUM: Magnesium: 1.5 mg/dL — ABNORMAL LOW (ref 1.7–2.4)

## 2023-12-29 MED ORDER — SODIUM CHLORIDE 0.9 % IV SOLN
150.0000 mg/m2 | Freq: Once | INTRAVENOUS | Status: AC
  Administered 2023-12-29: 300 mg via INTRAVENOUS
  Filled 2023-12-29: qty 15

## 2023-12-29 MED ORDER — ATROPINE SULFATE 1 MG/ML IV SOLN
0.5000 mg | Freq: Once | INTRAVENOUS | Status: AC | PRN
  Administered 2023-12-29: 0.5 mg via INTRAVENOUS
  Filled 2023-12-29: qty 1

## 2023-12-29 MED ORDER — SODIUM CHLORIDE 0.9 % IV SOLN
400.0000 mg/m2 | Freq: Once | INTRAVENOUS | Status: AC
  Administered 2023-12-29: 768 mg via INTRAVENOUS
  Filled 2023-12-29: qty 38.4

## 2023-12-29 MED ORDER — PALONOSETRON HCL INJECTION 0.25 MG/5ML
0.2500 mg | Freq: Once | INTRAVENOUS | Status: AC
  Administered 2023-12-29: 0.25 mg via INTRAVENOUS
  Filled 2023-12-29: qty 5

## 2023-12-29 MED ORDER — SODIUM CHLORIDE 0.9 % IV SOLN: INTRAVENOUS | Status: DC

## 2023-12-29 MED ORDER — MAGNESIUM SULFATE 2 GM/50ML IV SOLN
2.0000 g | Freq: Once | INTRAVENOUS | Status: AC
  Administered 2023-12-29: 2 g via INTRAVENOUS
  Filled 2023-12-29: qty 50

## 2023-12-29 MED ORDER — SODIUM CHLORIDE 0.9 % IV SOLN
2000.0000 mg/m2 | INTRAVENOUS | Status: DC
  Administered 2023-12-29: 3500 mg via INTRAVENOUS
  Filled 2023-12-29: qty 70

## 2023-12-29 MED ORDER — SODIUM CHLORIDE 0.9 % IV SOLN
6.0000 mg/kg | Freq: Once | INTRAVENOUS | Status: AC
  Administered 2023-12-29: 500 mg via INTRAVENOUS
  Filled 2023-12-29: qty 5

## 2023-12-29 MED ORDER — DEXAMETHASONE SOD PHOSPHATE PF 10 MG/ML IJ SOLN
10.0000 mg | Freq: Once | INTRAMUSCULAR | Status: AC
  Administered 2023-12-29: 10 mg via INTRAVENOUS

## 2023-12-29 MED ORDER — FAMOTIDINE IN NACL 20-0.9 MG/50ML-% IV SOLN
20.0000 mg | Freq: Once | INTRAVENOUS | Status: AC
  Administered 2023-12-29: 20 mg via INTRAVENOUS
  Filled 2023-12-29: qty 50

## 2023-12-29 NOTE — Progress Notes (Signed)
 [facility]   [phone] [phone]   Clinic Follow up Note   Patient Care Team: [name], MD as PCP - General (Internal Medicine) [name], DO as PCP - Cardiology (Cardiology) [name], MD as Consulting Physician (Oncology) [name], MD as Consulting Physician (Gastroenterology)  Date of Service:  12/29/2023  CHIEF COMPLAINT: f/u of metastatic colon cancer  CURRENT THERAPY:  Chemotherapy FOLFIRI and Vectibix   Oncology History   metastatic colon 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 Liver biopsy 01/08/20 confirmed metastasis from primary colorectal cancer. PET scan showed hypermetabolism to known liver mets, diffuse thoracic and abdominal lymphadenopathy, a 1.8 cm LUL pulmonary nodule, and splenic flexure of colon. -treated with first line FOLFOX 01/25/20 - 10/02/20, Vectibix  added with C2. Oxali discontinued after C18 due to reaction. -switched to Xeloda  10/21/20, dose adjusted due to significant skin toxicity -due to cancer progression, treatment has been changed to FOLFIRI and bevacizumab  on 03/25/22 -will repeat CT scan after next cycle chemo  -Restaging CT scan from 06/16/2022 showed stable to mild decrease in size of treated liver metastasis. No new lesions -Her restaging CT scan on August 31, 2022 showed stable liver metastasis, and a stable 7 mm left lung nodule, indeterminate.  -restaging CT in 06/2023 showed mild disease progression in liver, I changed Beva to vectibix   - Restaging CT scan on September 27, 2023 showed stable disease, will continue current treatment.  Peripheral neuropathy due to chemotherapy -overall mild and stable   Assessment & Plan Metastatic colon cancer Well-managed with no new symptoms. Current treatment includes chemotherapy. A port is planned for improved chemotherapy administration,  and she agreed to the procedure, which will allow for the removal of the current PICC line. - Order CT scan in three weeks - Schedule port placement with interventional radiology - Remove PICC line on the same day as port placement - Ensure someone is available to drive her on the day of the procedure  Chemotherapy-induced peripheral neuropathy Experiencing numbness and tingling, which remains unchanged and does not impact daily activities.  Hypomagnesemia Magnesium  levels are slightly low. Currently taking magnesium  supplement once daily. - Increase magnesium  supplementation to twice daily  Skin irritation with pruritus Dark spot on the left low neck skin with itching, possibly due to a burn. Currently using Vaseline for management. - Recommend Benadryl  cream or hydrocortisone cream for itching  Plan - Patient is clinically doing very well, lab reviewed, adequate for treatment, will proceed to chemo today and continue every 2 weeks - Follow-up in 2 weeks - Repeat CT scan in 3 weeks - IR port placement in 2 to 3 weeks, and I will remove her PICC line after port is placed. - Magnesium  slightly low 1.5 today, will increase oral mag back to twice daily   SUMMARY OF ONCOLOGIC HISTORY: Oncology History  metastatic colon cancer to liver  06/20/2019 Procedure   Colonoscopy by Dr Wilhelmenia 06/20/19  IMPRESSION -Seven 3 to 10 mm polyps in the sigmoid colon, in the transverse colon and in the escending colon, removed with a cold snare. Resected and retrireved.  -One 20mm polyp in the descending colon. Biopsies. Tattoes.  -Mediaum sized lipoma in the ascending colon.   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 See Comment B. Colon, Ascending, polyp, Polypectomy:  -TUBULAR  ADENOMA -No high grade dysplasia or malignancy.  C. Colon, TRansverse, Polyo, polectomy:  -TUBULAR ADENOMA -No high grade dysplasia or  malignancy.  D. Colon, Sigmoid, Polyp, Polypectomy:  -HYPERPLASTIC POLYP   11/07/2019 Imaging   US  Abdomen 11/07/19  IMPRESSION: 1. Two solid masses in the liver are nonspecific. Recommend MRI abdomen with and without contrast for further evaluation.   12/15/2019 Imaging   MRI Abdomen 12/15/19  IMPRESSION: 1. There are two large masses in the liver with appearance favoring metastatic disease or hepatocellular carcinoma or cholangiocarcinoma. A benign etiology is highly unlikely given the enhancement pattern and associated adenopathy. 2. Considerable porta hepatis and retroperitoneal adenopathy. Some of the confluent porta hepatis tumor is potentially infiltrative and abuts the pancreatic body along its right upper margin, making it difficult to completely exclude the possibility of pancreatic adenocarcinoma primary. Possibilities helpful in further workup might include tissue diagnosis, endoscopic ultrasound, or nuclear medicine PET-CT. 3. Pancreas divisum. 4. Lumbar spondylosis and degenerative disc disease. 5. Despite efforts by the technologist and patient, motion artifact is present on today's exam and could not be eliminated. This reduces exam sensitivity and specificity.   12/22/2019 Procedure   Upper Endoscopy by Dr Rollin 12/22/19  IMPRESSION - One lymph node was visualized and measured in the porta hepatis region. Fine needle aspiration performed    12/22/2019 Initial Biopsy   A. LIVER, PORTA HEPATIS MASS, FINE NEEDLE 12/22/19 ASPIRATION:  FINAL MICROSCOPIC DIAGNOSIS:  - Malignant cells consistent with metastatic adenocarcinoma   01/01/2020 Initial Diagnosis   Intrahepatic cholangiocarcinoma (HCC)   01/08/2020 Initial Biopsy   FINAL MICROSCOPIC DIAGNOSIS:   A. LIVER, LEFT LOBE, BIOPSY:  - Metastatic adenocarcinoma, consistent with a colorectal primary.  See  comment      COMMENT:   Immunohistochemical stains show the tumor cells are positive for CK20  and  CDX2 but negative for CK7, consistent with above interpretation.  Dr. 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   01/24/2020 Procedure   PAC placed 01/24/20   01/25/2020 -  Chemotherapy   first line FOLFOX starting 01/25/2020, Vextibix  added with C2 (02/06/20)   02/06/2020 - 02/06/2022 Chemotherapy   Patient is on Treatment Plan : COLORECTAL Panitumumab  q14d ([name] Wild - Type Gene Only)     06/20/2020 Imaging   CT CAP  IMPRESSION: 1. Continued interval reduction in size and conspicuity of a subsolid nodule of the peripheral left upper lobe. 2. Unchanged prominent pretracheal and subcarinal lymph nodes. 3. Redemonstrated partially calcified low-attenuation liver masses, slightly decreased in size compared to prior examination. 4. Slight interval decrease in size of a portacaval lymph node or conglomerate and retroperitoneal lymph nodes. 5. Findings are consistent with continued treatment response of nodal, pulmonary, and hepatic metastatic disease. 6. Coronary artery disease.   Aortic Atherosclerosis (ICD10-I70.0).     10/09/2020 Imaging   IMPRESSION: 1. Slight decrease in size of dominant liver mass with smaller liver mass within 1-2 mm of prior measurement. 2. Stable appearance of celiac lymph and retroperitoneal lymph nodes. Dominant node with calcification in the gastrohepatic ligament as described. 3. Continued decrease in size of LEFT upper lobe nodule. 4. Three-vessel coronary artery calcification. 5. Aortic atherosclerosis.   03/25/2022 - 07/16/2023 Chemotherapy   Patient is on Treatment Plan : COLORECTAL FOLFIRI + Bevacizumab  q14d     08/31/2022 Imaging    IMPRESSION: 1. Unchanged, densely calcified  liver lesions, consistent with treated metastatic disease. 2. Unchanged, irregular subpleural opacity of the peripheral left upper  lobe. 3. Unchanged subcentimeter gastrohepatic ligament lymph node. 4. No evidence of new metastatic disease in the chest, abdomen, or pelvis. 5. Status post hysterectomy. 6. Coronary artery disease. 7. Aortic valve calcifications. Correlate for echocardiographic evidence of aortic valve dysfunction.     06/22/2023 Imaging   CT Chest, Abdomen, and pelvis with contrast  IMPRESSION: Centrally calcified liver lesions are again seen and slightly increased today. Prominent nodes in the upper retroperitoneum are overall similar. Attention on follow-up.   Stable branching left upper lobe lung nodule.   08/11/2023 -  Chemotherapy   Patient is on Treatment Plan : COLORECTAL FOLFIRI + Panitumumab  q14d        Discussed the use of AI scribe software for clinical note transcription with the patient, who gave verbal consent to proceed.  History of Present Illness Lindsay [name] is a 69 year old female with metastatic colon cancer who presents for follow-up.  She experiences numbness and tingling consistent with peripheral neuropathy, which are stable and do not interfere with daily activities. She remains active and performs all tasks at home.  She has a dark spot on her skin resembling a burn that sometimes itches. She applies Vaseline to the area and seeks advice on managing the itching.  She takes magnesium  once daily, reduced from twice daily as previously instructed. No pain, diarrhea, or rash. She has no issues with contrast for upcoming scans. A PICC line is in place.     All other systems were reviewed with the patient and are negative.  MEDICAL HISTORY:  Past Medical History:  Diagnosis Date   Arthritis    Diabetes mellitus without complication (HCC)    Hypertension    met colon ca to liver 01/2020    SURGICAL HISTORY: Past Surgical History:  Procedure Laterality Date   ABDOMINAL HYSTERECTOMY     ANTERIOR AND POSTERIOR REPAIR WITH SACROSPINOUS FIXATION N/A 07/16/2016    Procedure: ANTERIOR AND POSTERIOR REPAIR WITH SACROSPINOUS FIXATION;  Surgeon: Curlene Agent, MD;  Location: [facility];  Service: Gynecology;  Laterality: N/A;   CYSTOSCOPY  07/16/2016   Procedure: CYSTOSCOPY;  Surgeon: Curlene Agent, MD;  Location: [facility];  Service: Gynecology;;   ESOPHAGOGASTRODUODENOSCOPY (EGD) WITH PROPOFOL  N/A 12/22/2019   Procedure: ESOPHAGOGASTRODUODENOSCOPY (EGD) WITH PROPOFOL ;  Surgeon: [name], MD;  Location: [facility];  Service: Endoscopy;  Laterality: N/A;   FINE NEEDLE ASPIRATION N/A 12/22/2019   Procedure: FINE NEEDLE ASPIRATION (FNA) LINEAR;  Surgeon: [name], MD;  Location: [facility];  Service: Endoscopy;  Laterality: N/A;   IR IMAGING GUIDED PORT INSERTION  01/24/2020   IR REMOVAL TUN ACCESS W/ PORT W/O FL MOD SED  07/30/2023   LAPAROSCOPIC VAGINAL HYSTERECTOMY WITH SALPINGECTOMY Bilateral 07/16/2016   Procedure: LAPAROSCOPIC ASSISTED VAGINAL HYSTERECTOMY WITH SALPINGECTOMY;  Surgeon: Curlene Agent, MD;  Location: [facility];  Service: Gynecology;  Laterality: Bilateral;   LEFT HEART CATH AND CORONARY ANGIOGRAPHY N/A 05/07/2023   Procedure: LEFT HEART CATH AND CORONARY ANGIOGRAPHY;  Surgeon: Anner Alm ORN, MD;  Location: [facility];  Service: Cardiovascular;  Laterality: N/A;   MYOMECTOMY ABDOMINAL APPROACH     THYROID SURGERY     tyroid     UPPER ESOPHAGEAL ENDOSCOPIC ULTRASOUND (EUS) N/A 12/22/2019   Procedure: UPPER ESOPHAGEAL ENDOSCOPIC ULTRASOUND (EUS);  Surgeon: [name], MD;  Location: [facility];  Service: Endoscopy;  Laterality: N/A;    I have reviewed  the social history and family history with the patient and they are unchanged from previous note.  ALLERGIES:  is allergic to oxaliplatin .  MEDICATIONS:  Current Outpatient Medications  Medication Sig Dispense Refill   aspirin  EC 81 MG tablet Take 1 tablet (81 mg total) by mouth daily. Swallow whole. 90 tablet 0   atorvastatin  (LIPITOR) 80 MG tablet Take 1 tablet (80 mg  total) by mouth daily. 90 tablet 0   clindamycin  (CLEOCIN -T) 1 % lotion Apply topically as needed. 60 mL 0   clopidogrel  (PLAVIX ) 75 MG tablet Take 1 tablet (75 mg total) by mouth daily. 90 tablet 0   doxycycline  (VIBRA -TABS) 100 MG tablet Take 1 tablet (100 mg total) by mouth 2 (two) times daily. 60 tablet 3   ezetimibe  (ZETIA ) 10 MG tablet Take 1 tablet (10 mg total) by mouth daily. 90 tablet 3   isosorbide  mononitrate (IMDUR ) 30 MG 24 hr tablet Take 1 tablet (30 mg total) by mouth every evening. 90 tablet 3   magnesium  oxide (MAG-OX) 400 (240 Mg) MG tablet Take 1 tablet (400 mg total) by mouth 2 (two) times daily. 60 tablet 1   metoprolol  succinate (TOPROL  XL) 25 MG 24 hr tablet Take 1 tablet (25 mg total) by mouth daily. Hold if systolic BP (top number) is less than 100 and/or heart rate is less than 55 90 tablet 3   nitroGLYCERIN  (NITROSTAT ) 0.4 MG SL tablet Place 1 tablet (0.4 mg total) under the tongue every 5 (five) minutes as needed for chest pain. 75 tablet 3   POTASSIUM CHLORIDE  PO Take by mouth every other day.     prochlorperazine  (COMPAZINE ) 10 MG tablet Take 1 tablet (10 mg total) by mouth every 6 (six) hours as needed for nausea or vomiting. 90 tablet 0   ranolazine  (RANEXA ) 500 MG 12 hr tablet Take 1 tablet (500 mg total) by mouth 2 (two) times daily. 180 tablet 3   No current facility-administered medications for this visit.   Facility-Administered Medications Ordered in Other Visits  Medication Dose Route Frequency Provider Last Rate Last Admin   0.9 %  sodium chloride  infusion   Intravenous Continuous [name], MD 10 mL/hr at 12/29/23 1250 New Bag at 12/29/23 1250   atropine  injection 0.5 mg  0.5 mg Intravenous Once PRN [name], MD       fluorouracil  (ADRUCIL ) 3,500 mg in sodium chloride  0.9 % 80 mL chemo infusion  2,000 mg/m2 (Treatment Plan Recorded) Intravenous 1 day or 1 dose [name], MD       irinotecan  (CAMPTOSAR ) 300 mg in sodium chloride  0.9 % 500 mL chemo  infusion  150 mg/m2 (Treatment Plan Recorded) Intravenous Once [name], MD       leucovorin  768 mg in sodium chloride  0.9 % 250 mL infusion  400 mg/m2 (Treatment Plan Recorded) Intravenous Once [name], MD       magnesium  sulfate IVPB 2 g 50 mL  2 g Intravenous Once [name], MD 50 mL/hr at 12/29/23 1333 2 g at 12/29/23 1333   panitumumab  (VECTIBIX ) 500 mg in sodium chloride  0.9 % 100 mL chemo infusion  6 mg/kg (Treatment Plan Recorded) Intravenous Once [name], MD        PHYSICAL EXAMINATION: ECOG PERFORMANCE STATUS: 1 - Symptomatic but completely ambulatory  Vitals:   12/29/23 1153  BP: 122/70  Pulse: 80  Resp: 14  Temp: (!) 97.2 F (36.2 C)  SpO2: 99%   Wt Readings from Last 3 Encounters:  12/29/23 175 lb 3.2 oz (79.5 kg)  12/15/23 175 lb 8 oz (79.6 kg)  12/01/23 176 lb 1.6 oz (79.9 kg)     GENERAL:alert, no distress and comfortable SKIN: skin color, texture, turgor are normal, no rashes or significant lesions except skin hyperpigmentation in the left lower neck EYES: normal, Conjunctiva are pink and non-injected, sclera clear NECK: supple, thyroid normal size, non-tender, without nodularity LYMPH:  no palpable lymphadenopathy in the cervical, axillary  LUNGS: clear to auscultation and percussion with normal breathing effort HEART: regular rate & rhythm and no murmurs and no lower extremity edema ABDOMEN:abdomen soft, non-tender and normal bowel sounds Musculoskeletal:no cyanosis of digits and no clubbing  NEURO: alert & oriented x 3 with fluent speech, no focal motor/sensory deficits  Physical Exam    LABORATORY DATA:  I have reviewed the data as listed    Latest Ref Rng & Units 12/29/2023   10:44 AM 12/15/2023   11:02 AM 12/01/2023    8:44 AM  CBC  WBC 4.0 - 10.5 K/uL 5.2  4.8  4.9   Hemoglobin 12.0 - 15.0 g/dL 88.9  89.3  89.0   Hematocrit 36.0 - 46.0 % 34.1  32.6  33.3   Platelets 150 - 400 K/uL 254  288  239         Latest Ref Rng & Units 12/29/2023    10:44 AM 12/15/2023   11:02 AM 12/01/2023    8:44 AM  CMP  Glucose 70 - 99 mg/dL 873  882  851   BUN 8 - 23 mg/dL 8  7  8    Creatinine 0.44 - 1.00 mg/dL 9.39  9.37  9.34   Sodium 135 - 145 mmol/L 139  138  139   Potassium 3.5 - 5.1 mmol/L 3.6  4.0  3.6   Chloride 98 - 111 mmol/L 103  103  105   CO2 22 - 32 mmol/L 30  31  29    Calcium  8.9 - 10.3 mg/dL 9.6  9.5  9.1   Total Protein 6.5 - 8.1 g/dL 7.1  7.2  7.2   Total Bilirubin 0.0 - 1.2 mg/dL 0.4  0.4  0.3   Alkaline Phos 38 - 126 U/L 150  160  154   AST 15 - 41 U/L 16  18  37   ALT 0 - 44 U/L 15  20  57       RADIOGRAPHIC STUDIES: I have personally reviewed the radiological images as listed and agreed with the findings in the report. No results found.    Orders Placed This Encounter  Procedures   CT CHEST ABDOMEN PELVIS W CONTRAST    Standing Status:   Future    Expected Date:   01/19/2024    Expiration Date:   12/28/2024    If indicated for the ordered procedure, I authorize the administration of contrast media per Radiology protocol:   Yes    Does the patient have a contrast media/X-ray dye allergy?:   No    Preferred imaging location?:   [facility]    If indicated for the ordered procedure, I authorize the administration of oral contrast media per Radiology protocol:   Yes   IR IMAGING GUIDED PORT INSERTION    Standing Status:   Future    Expected Date:   01/05/2024    Expiration Date:   12/28/2024    Reason for Exam (SYMPTOM  OR DIAGNOSIS REQUIRED):   chemo    Preferred Imaging Location?:  [facility]   CBC with Differential (Cancer Center Only)    Standing Status:   Future    Expected Date:   01/26/2024    Expiration Date:   01/25/2025   CMP (Cancer Center only)    Standing Status:   Future    Expected Date:   01/26/2024    Expiration Date:   01/25/2025   Magnesium     Standing Status:   Future    Expected Date:   01/26/2024    Expiration Date:   01/25/2025   CBC with Differential (Cancer  Center Only)    Standing Status:   Future    Expected Date:   02/09/2024    Expiration Date:   02/08/2025   CMP (Cancer Center only)    Standing Status:   Future    Expected Date:   02/09/2024    Expiration Date:   02/08/2025   Magnesium     Standing Status:   Future    Expected Date:   02/09/2024    Expiration Date:   02/08/2025   All questions were answered. The patient knows to call the clinic with any problems, questions or concerns. No barriers to learning was detected. The total time spent in the appointment was 25 minutes, including review of chart and various tests results, discussions about plan of care and coordination of care plan     Onita Mattock, MD 12/29/2023

## 2023-12-29 NOTE — Assessment & Plan Note (Signed)
-  overall mild and stable

## 2023-12-29 NOTE — Patient Instructions (Signed)
 [facility] - A DEPT OF [facility]  Discharge Instructions: Thank you for choosing [facility] to provide your oncology and hematology care.   If you have a lab appointment with the Cancer Center, please go directly to the Cancer Center and check in at the registration area.   Wear comfortable clothing and clothing appropriate for easy access to any Portacath or PICC line.   We strive to give you quality time with your provider. You may need to reschedule your appointment if you arrive late (15 or more minutes).  Arriving late affects you and other patients whose appointments are after yours.  Also, if you miss three or more appointments without notifying the office, you may be dismissed from the clinic at the provider's discretion.      For prescription refill requests, have your pharmacy contact our office and allow 72 hours for refills to be completed.    Today you received the following chemotherapy and/or immunotherapy agents vectibix , irinotecan , leucovorin , adrucil , and magnesium       To help prevent nausea and vomiting after your treatment, we encourage you to take your nausea medication as directed.  BELOW ARE SYMPTOMS THAT SHOULD BE REPORTED IMMEDIATELY: *FEVER GREATER THAN 100.4 F (38 C) OR HIGHER *CHILLS OR SWEATING *NAUSEA AND VOMITING THAT IS NOT CONTROLLED WITH YOUR NAUSEA MEDICATION *UNUSUAL SHORTNESS OF BREATH *UNUSUAL BRUISING OR BLEEDING *URINARY PROBLEMS (pain or burning when urinating, or frequent urination) *BOWEL PROBLEMS (unusual diarrhea, constipation, pain near the anus) TENDERNESS IN MOUTH AND THROAT WITH OR WITHOUT PRESENCE OF ULCERS (sore throat, sores in mouth, or a toothache) UNUSUAL RASH, SWELLING OR PAIN  UNUSUAL VAGINAL DISCHARGE OR ITCHING   Items with * indicate a potential emergency and should be followed up as soon as possible or go to the Emergency Department if any problems should occur.  Please show  the CHEMOTHERAPY ALERT CARD or IMMUNOTHERAPY ALERT CARD at check-in to the Emergency Department and triage nurse.  Should you have questions after your visit or need to cancel or reschedule your appointment, please contact [facility] - A DEPT OF [facility]  Dept: [phone]  and follow the prompts.  Office hours are 8:00 a.m. to 4:30 p.m. Monday - Friday. Please note that voicemails left after 4:00 p.m. may not be returned until the following business day.  We are closed weekends and major holidays. You have access to a nurse at all times for urgent questions. Please call the main number to the clinic Dept: [phone] and follow the prompts.   For any non-urgent questions, you may also contact your provider using MyChart. We now offer e-Visits for anyone 77 and older to request care online for non-urgent symptoms. For details visit [email or web address].   Also download the MyChart app! Go to the app store, search MyChart, open the app, select [facility], and log in with your MyChart username and password.

## 2023-12-29 NOTE — Assessment & Plan Note (Signed)
 MSS, [name]/[name] wildtype -diagnosed 12/2019 by porta hepatis LN biopsy during EUS for work up of abdominal pain and liver lesions seen on US  and MRI. Liver biopsy 01/08/20 confirmed metastasis from primary colorectal cancer. PET scan showed hypermetabolism to known liver mets, diffuse thoracic and abdominal lymphadenopathy, a 1.8 cm LUL pulmonary nodule, and splenic flexure of colon. -treated with first line FOLFOX 01/25/20 - 10/02/20, Vectibix  added with C2. Oxali discontinued after C18 due to reaction. -switched to Xeloda  10/21/20, dose adjusted due to significant skin toxicity -due to cancer progression, treatment has been changed to FOLFIRI and bevacizumab  on 03/25/22 -will repeat CT scan after next cycle chemo  -Restaging CT scan from 06/16/2022 showed stable to mild decrease in size of treated liver metastasis. No new lesions -Her restaging CT scan on August 31, 2022 showed stable liver metastasis, and a stable 7 mm left lung nodule, indeterminate.  -restaging CT in 06/2023 showed mild disease progression in liver, I changed Beva to vectibix   - Restaging CT scan on September 27, 2023 showed stable disease, will continue current treatment.

## 2023-12-31 ENCOUNTER — Inpatient Hospital Stay: Payer: Medicare (Managed Care)

## 2023-12-31 VITALS — BP 107/73 | HR 66 | Temp 98.8°F | Resp 18

## 2023-12-31 DIAGNOSIS — C221 Intrahepatic bile duct carcinoma: Secondary | ICD-10-CM

## 2024-01-05 ENCOUNTER — Inpatient Hospital Stay: Payer: Medicare (Managed Care)

## 2024-01-05 DIAGNOSIS — C221 Intrahepatic bile duct carcinoma: Secondary | ICD-10-CM

## 2024-01-05 DIAGNOSIS — Z5112 Encounter for antineoplastic immunotherapy: Secondary | ICD-10-CM | POA: Diagnosis not present

## 2024-01-05 DIAGNOSIS — C189 Malignant neoplasm of colon, unspecified: Secondary | ICD-10-CM

## 2024-01-05 LAB — CMP (CANCER CENTER ONLY)
ALT: 26 U/L (ref 0–44)
AST: 19 U/L (ref 15–41)
Albumin: 4.1 g/dL (ref 3.5–5.0)
Alkaline Phosphatase: 164 U/L — ABNORMAL HIGH (ref 38–126)
Anion gap: 6 (ref 5–15)
BUN: 9 mg/dL (ref 8–23)
CO2: 29 mmol/L (ref 22–32)
Calcium: 9.3 mg/dL (ref 8.9–10.3)
Chloride: 102 mmol/L (ref 98–111)
Creatinine: 0.6 mg/dL (ref 0.44–1.00)
GFR, Estimated: >60 mL/min (ref >60)
Glucose, Bld: 175 mg/dL — ABNORMAL HIGH (ref 70–99)
Potassium: 3.4 mmol/L — ABNORMAL LOW (ref 3.5–5.1)
Sodium: 137 mmol/L (ref 135–145)
Total Bilirubin: 0.4 mg/dL (ref 0.0–1.2)
Total Protein: 7.5 g/dL (ref 6.5–8.1)

## 2024-01-05 LAB — CBC WITH DIFFERENTIAL (CANCER CENTER ONLY)
Abs Immature Granulocytes: 0.02 K/uL (ref 0.00–0.07)
Basophils Absolute: 0 K/uL (ref 0.0–0.1)
Basophils Relative: 1 %
Eosinophils Absolute: 0.2 K/uL (ref 0.0–0.5)
Eosinophils Relative: 4 %
HCT: 34.3 % — ABNORMAL LOW (ref 36.0–46.0)
Hemoglobin: 11.4 g/dL — ABNORMAL LOW (ref 12.0–15.0)
Immature Granulocytes: 0 %
Lymphocytes Relative: 22 %
Lymphs Abs: 1.2 K/uL (ref 0.7–4.0)
MCH: 28.7 pg (ref 26.0–34.0)
MCHC: 33.2 g/dL (ref 30.0–36.0)
MCV: 86.4 fL (ref 80.0–100.0)
Monocytes Absolute: 0.4 K/uL (ref 0.1–1.0)
Monocytes Relative: 7 %
Neutro Abs: 3.5 K/uL (ref 1.7–7.7)
Neutrophils Relative %: 66 %
Platelet Count: 277 K/uL (ref 150–400)
RBC: 3.97 MIL/uL (ref 3.87–5.11)
RDW: 16.5 % — ABNORMAL HIGH (ref 11.5–15.5)
WBC Count: 5.4 K/uL (ref 4.0–10.5)
nRBC: 0 % (ref 0.0–0.2)

## 2024-01-06 LAB — CEA (ACCESS): CEA (CHCC): 108.17 ng/mL — ABNORMAL HIGH (ref 0.00–5.00)

## 2024-01-11 ENCOUNTER — Inpatient Hospital Stay: Payer: Medicare (Managed Care)

## 2024-01-11 ENCOUNTER — Inpatient Hospital Stay: Payer: Medicare (Managed Care) | Attending: Hematology | Admitting: Hematology

## 2024-01-11 VITALS — BP 128/70 | HR 67 | Temp 97.5°F | Resp 16 | Ht 66.0 in | Wt 176.9 lb

## 2024-01-11 VITALS — BP 148/87 | HR 81 | Temp 98.3°F | Resp 16

## 2024-01-11 DIAGNOSIS — Z90710 Acquired absence of both cervix and uterus: Secondary | ICD-10-CM | POA: Insufficient documentation

## 2024-01-11 DIAGNOSIS — C19 Malignant neoplasm of rectosigmoid junction: Secondary | ICD-10-CM | POA: Insufficient documentation

## 2024-01-11 DIAGNOSIS — G620 Drug-induced polyneuropathy: Secondary | ICD-10-CM | POA: Insufficient documentation

## 2024-01-11 DIAGNOSIS — C787 Secondary malignant neoplasm of liver and intrahepatic bile duct: Secondary | ICD-10-CM | POA: Diagnosis present

## 2024-01-11 DIAGNOSIS — Z5111 Encounter for antineoplastic chemotherapy: Secondary | ICD-10-CM | POA: Insufficient documentation

## 2024-01-11 DIAGNOSIS — Z5112 Encounter for antineoplastic immunotherapy: Secondary | ICD-10-CM | POA: Diagnosis present

## 2024-01-11 DIAGNOSIS — C221 Intrahepatic bile duct carcinoma: Secondary | ICD-10-CM

## 2024-01-11 DIAGNOSIS — C7800 Secondary malignant neoplasm of unspecified lung: Secondary | ICD-10-CM | POA: Diagnosis not present

## 2024-01-11 DIAGNOSIS — T451X5A Adverse effect of antineoplastic and immunosuppressive drugs, initial encounter: Secondary | ICD-10-CM

## 2024-01-11 DIAGNOSIS — T451X5D Adverse effect of antineoplastic and immunosuppressive drugs, subsequent encounter: Secondary | ICD-10-CM | POA: Diagnosis not present

## 2024-01-11 DIAGNOSIS — Z79899 Other long term (current) drug therapy: Secondary | ICD-10-CM | POA: Insufficient documentation

## 2024-01-11 LAB — CBC WITH DIFFERENTIAL (CANCER CENTER ONLY)
Abs Immature Granulocytes: 0.02 K/uL (ref 0.00–0.07)
Basophils Absolute: 0 K/uL (ref 0.0–0.1)
Basophils Relative: 1 %
Eosinophils Absolute: 0.2 K/uL (ref 0.0–0.5)
Eosinophils Relative: 4 %
HCT: 33.7 % — ABNORMAL LOW (ref 36.0–46.0)
Hemoglobin: 11 g/dL — ABNORMAL LOW (ref 12.0–15.0)
Immature Granulocytes: 0 %
Lymphocytes Relative: 21 %
Lymphs Abs: 1.2 K/uL (ref 0.7–4.0)
MCH: 28.6 pg (ref 26.0–34.0)
MCHC: 32.6 g/dL (ref 30.0–36.0)
MCV: 87.5 fL (ref 80.0–100.0)
Monocytes Absolute: 0.5 K/uL (ref 0.1–1.0)
Monocytes Relative: 9 %
Neutro Abs: 3.9 K/uL (ref 1.7–7.7)
Neutrophils Relative %: 65 %
Platelet Count: 270 K/uL (ref 150–400)
RBC: 3.85 MIL/uL — ABNORMAL LOW (ref 3.87–5.11)
RDW: 17.2 % — ABNORMAL HIGH (ref 11.5–15.5)
WBC Count: 5.9 K/uL (ref 4.0–10.5)
nRBC: 0 % (ref 0.0–0.2)

## 2024-01-11 LAB — CMP (CANCER CENTER ONLY)
ALT: 27 U/L (ref 0–44)
AST: 22 U/L (ref 15–41)
Albumin: 4 g/dL (ref 3.5–5.0)
Alkaline Phosphatase: 178 U/L — ABNORMAL HIGH (ref 38–126)
Anion gap: 5 (ref 5–15)
BUN: 8 mg/dL (ref 8–23)
CO2: 30 mmol/L (ref 22–32)
Calcium: 9.3 mg/dL (ref 8.9–10.3)
Chloride: 105 mmol/L (ref 98–111)
Creatinine: 0.58 mg/dL (ref 0.44–1.00)
GFR, Estimated: >60 mL/min (ref >60)
Glucose, Bld: 120 mg/dL — ABNORMAL HIGH (ref 70–99)
Potassium: 3.6 mmol/L (ref 3.5–5.1)
Sodium: 140 mmol/L (ref 135–145)
Total Bilirubin: 0.3 mg/dL (ref 0.0–1.2)
Total Protein: 7.1 g/dL (ref 6.5–8.1)

## 2024-01-11 LAB — MAGNESIUM: Magnesium: 1.6 mg/dL — ABNORMAL LOW (ref 1.7–2.4)

## 2024-01-11 MED ORDER — SODIUM CHLORIDE 0.9 % IV SOLN: INTRAVENOUS | Status: DC

## 2024-01-11 MED ORDER — SODIUM CHLORIDE 0.9 % IV SOLN
400.0000 mg/m2 | Freq: Once | INTRAVENOUS | Status: AC
  Administered 2024-01-11: 768 mg via INTRAVENOUS
  Filled 2024-01-11: qty 25

## 2024-01-11 MED ORDER — SODIUM CHLORIDE 0.9 % IV SOLN
6.0000 mg/kg | Freq: Once | INTRAVENOUS | Status: AC
  Administered 2024-01-11: 500 mg via INTRAVENOUS
  Filled 2024-01-11: qty 5

## 2024-01-11 MED ORDER — DEXAMETHASONE SOD PHOSPHATE PF 10 MG/ML IJ SOLN
10.0000 mg | Freq: Once | INTRAMUSCULAR | Status: AC
  Administered 2024-01-11: 10 mg via INTRAVENOUS

## 2024-01-11 MED ORDER — SODIUM CHLORIDE 0.9 % IV SOLN
150.0000 mg/m2 | Freq: Once | INTRAVENOUS | Status: AC
  Administered 2024-01-11: 300 mg via INTRAVENOUS
  Filled 2024-01-11: qty 15

## 2024-01-11 MED ORDER — MAGNESIUM SULFATE 2 GM/50ML IV SOLN
2.0000 g | Freq: Once | INTRAVENOUS | Status: AC
  Administered 2024-01-11: 2 g via INTRAVENOUS
  Filled 2024-01-11: qty 50

## 2024-01-11 MED ORDER — ATROPINE SULFATE 1 MG/ML IV SOLN
0.5000 mg | Freq: Once | INTRAVENOUS | Status: AC | PRN
  Administered 2024-01-11: 0.5 mg via INTRAVENOUS
  Filled 2024-01-11: qty 1

## 2024-01-11 MED ORDER — FAMOTIDINE IN NACL 20-0.9 MG/50ML-% IV SOLN
20.0000 mg | Freq: Once | INTRAVENOUS | Status: AC
  Administered 2024-01-11: 20 mg via INTRAVENOUS
  Filled 2024-01-11: qty 50

## 2024-01-11 MED ORDER — PALONOSETRON HCL INJECTION 0.25 MG/5ML
0.2500 mg | Freq: Once | INTRAVENOUS | Status: AC
  Administered 2024-01-11: 0.25 mg via INTRAVENOUS
  Filled 2024-01-11: qty 5

## 2024-01-11 MED ORDER — SODIUM CHLORIDE 0.9 % IV SOLN
2000.0000 mg/m2 | INTRAVENOUS | Status: DC
  Administered 2024-01-11: 3500 mg via INTRAVENOUS
  Filled 2024-01-11: qty 70

## 2024-01-11 NOTE — Progress Notes (Signed)
 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 Liver biopsy 01/08/20 confirmed metastasis from primary colorectal cancer. PET scan showed hypermetabolism to known liver mets, diffuse thoracic and abdominal lymphadenopathy, a 1.8 cm LUL pulmonary nodule, and splenic flexure of colon. -treated with first line FOLFOX 01/25/20 - 10/02/20, Vectibix  added with C2. Oxali discontinued after C18 due to reaction. -switched to Xeloda  10/21/20, dose adjusted due to significant skin toxicity -due to cancer progression, treatment has been changed to FOLFIRI and bevacizumab  on 03/25/22 -will repeat CT scan after next cycle chemo  -Restaging CT scan from 06/16/2022 showed stable to mild decrease in size of treated liver metastasis. No new lesions -Her restaging CT scan on August 31, 2022 showed stable liver metastasis, and a stable 7 mm left lung nodule, indeterminate.  -restaging CT in 06/2023 showed mild disease progression in liver, I changed Beva to vectibix   - Restaging CT scan on September 27, 2023 showed stable disease, will continue current treatment.  Assessment & Plan Metastatic colon cancer Undergoing chemotherapy with good tolerance. No nausea or diarrhea. Blood counts are well-managed with normal white  count, hemoglobin at 11, and normal platelet count. Kidney and liver functions are normal. Potassium levels are normal but on the low end. Magnesium  level is 1.6. - Continue chemotherapy as scheduled. - Scheduled next chemotherapy session for November 19th, 2025. - Scheduled follow-up scan for November 12th, 2025. - Will review scan results in two weeks. - Will remove pick-a-line and place port on November 7th, 2025.  Chemotherapy induced polyneuropathy Reports numbness and tingling but no limitations in activities. Continues to stay active.  Hypokalemia, resolved Potassium levels have returned to normal. Currently taking potassium every other day. - Continue potassium supplementation every other day.  Hypomagnesemia Magnesium  level is 1.6. Currently taking magnesium  once a day. - Increase magnesium  supplementation to twice a day. - Continue to administer magnesium  during chemotherapy treatment.   Plan - She is tolerating chemotherapy very well, lab reviewed, adequate for treatment, proceed with chemotherapy today, she will receive IV magnesium  2 g during the infusion. - I increased oral magnesium  to twice a day - She is scheduled for restaging CT scan next week, will review the scan result on next visit in 2 weeks.  SUMMARY OF ONCOLOGIC HISTORY: Oncology History  metastatic colon cancer to liver  06/20/2019 Procedure   Colonoscopy by Dr Wilhelmenia 06/20/19  IMPRESSION -Seven 3 to 10 mm polyps in the sigmoid colon, in the transverse colon and in the escending colon, removed with a cold snare. Resected and retrireved.  -One 20mm polyp in the descending colon. Biopsies. Tattoes.  -Mediaum sized lipoma in the ascending colon.   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 See Comment B. Colon, Ascending, polyp, Polypectomy:  -TUBULAR ADENOMA -No  high grade dysplasia or malignancy.  C. Colon, TRansverse,  Polyo, polectomy:  -TUBULAR ADENOMA -No high grade dysplasia or malignancy.  D. Colon, Sigmoid, Polyp, Polypectomy:  -HYPERPLASTIC POLYP   11/07/2019 Imaging   US  Abdomen 11/07/19  IMPRESSION: 1. Two solid masses in the liver are nonspecific. Recommend MRI abdomen with and without contrast for further evaluation.   12/15/2019 Imaging   MRI Abdomen 12/15/19  IMPRESSION: 1. There are two large masses in the liver with appearance favoring metastatic disease or hepatocellular carcinoma or cholangiocarcinoma. A benign etiology is highly unlikely given the enhancement pattern and associated adenopathy. 2. Considerable porta hepatis and retroperitoneal adenopathy. Some of the confluent porta hepatis tumor is potentially infiltrative and abuts the pancreatic body along its right upper margin, making it difficult to completely exclude the possibility of pancreatic adenocarcinoma primary. Possibilities helpful in further workup might include tissue diagnosis, endoscopic ultrasound, or nuclear medicine PET-CT. 3. Pancreas divisum. 4. Lumbar spondylosis and degenerative disc disease. 5. Despite efforts by the technologist and patient, motion artifact is present on today's exam and could not be eliminated. This reduces exam sensitivity and specificity.   12/22/2019 Procedure   Upper Endoscopy by Dr Rollin 12/22/19  IMPRESSION - One lymph node was visualized and measured in the porta hepatis region. Fine needle aspiration performed    12/22/2019 Initial Biopsy   A. LIVER, PORTA HEPATIS MASS, FINE NEEDLE 12/22/19 ASPIRATION:  FINAL MICROSCOPIC DIAGNOSIS:  - Malignant cells consistent with metastatic adenocarcinoma   01/01/2020 Initial Diagnosis   Intrahepatic cholangiocarcinoma (HCC)   01/08/2020 Initial Biopsy   FINAL MICROSCOPIC DIAGNOSIS:   A. LIVER, LEFT LOBE, BIOPSY:  - Metastatic adenocarcinoma, consistent with a colorectal primary.  See  comment      COMMENT:    Immunohistochemical stains show the tumor cells are positive for CK20  and CDX2 but negative for CK7, consistent with above interpretation.  Dr. 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   01/24/2020 Procedure   PAC placed 01/24/20   01/25/2020 -  Chemotherapy   first line FOLFOX starting 01/25/2020, Vextibix  added with C2 (02/06/20)   02/06/2020 - 02/06/2022 Chemotherapy   Patient is on Treatment Plan : COLORECTAL Panitumumab  q14d ([name] - Type Gene Only)     06/20/2020 Imaging   CT CAP  IMPRESSION: 1. Continued interval reduction in size and conspicuity of a subsolid nodule of the peripheral left upper lobe. 2. Unchanged prominent pretracheal and subcarinal lymph nodes. 3. Redemonstrated partially calcified low-attenuation liver masses, slightly decreased in size compared to prior examination. 4. Slight interval decrease in size of a portacaval lymph node or conglomerate and retroperitoneal lymph nodes. 5. Findings are consistent with continued treatment response of nodal, pulmonary, and hepatic metastatic disease. 6. Coronary artery disease.   Aortic Atherosclerosis (ICD10-I70.0).     10/09/2020 Imaging   IMPRESSION: 1. Slight decrease in size of dominant liver mass with smaller liver mass within 1-2 mm of prior measurement. 2. Stable appearance of celiac lymph and retroperitoneal lymph nodes. Dominant node with calcification in the gastrohepatic ligament as described. 3. Continued decrease in size of LEFT upper lobe nodule. 4. Three-vessel coronary artery calcification. 5. Aortic atherosclerosis.   03/25/2022 - 07/16/2023 Chemotherapy   Patient is on Treatment Plan : COLORECTAL FOLFIRI + Bevacizumab  q14d     08/31/2022 Imaging    IMPRESSION: 1. Unchanged, densely calcified liver lesions,  consistent with treated metastatic  disease. 2. Unchanged, irregular subpleural opacity of the peripheral left upper lobe. 3. Unchanged subcentimeter gastrohepatic ligament lymph node. 4. No evidence of new metastatic disease in the chest, abdomen, or pelvis. 5. Status post hysterectomy. 6. Coronary artery disease. 7. Aortic valve calcifications. Correlate for echocardiographic evidence of aortic valve dysfunction.     06/22/2023 Imaging   CT Chest, Abdomen, and pelvis with contrast  IMPRESSION: Centrally calcified liver lesions are again seen and slightly increased today. Prominent nodes in the upper retroperitoneum are overall similar. Attention on follow-up.   Stable branching left upper lobe lung nodule.   08/11/2023 -  Chemotherapy   Patient is on Treatment Plan : COLORECTAL FOLFIRI + Panitumumab  q14d        Discussed the use of AI scribe software for clinical note transcription with the patient, who gave verbal consent to proceed.  History of Present Illness Lindsay [name] is a 68 year old female with metastatic cancer who presents for follow-up.  She experiences persistent neuropathy symptoms, including numbness and tingling, but these do not limit her activities. She remains active.  She tolerates chemotherapy well with no nausea or diarrhea. Her appetite is good.  Recent blood work shows normal white blood cell and platelet counts, with hemoglobin at 11. Kidney and liver functions are normal. Potassium level is on the low end of normal; she takes potassium every other day and magnesium  once a day.  No significant issues with a rash.  She is scheduled for chemotherapy treatments on November 4, November 18, December 2, and December 16, preferring Wednesday appointments. She plans to be out of town for Thanksgiving and Christmas, and her treatment schedule accommodates these plans.  Her last scan was a month ago, and she is scheduled for another on November 12. She currently has a PICC line, which is scheduled  to be replaced with a port on November 7.     All other systems were reviewed with the patient and are negative.  MEDICAL HISTORY:  Past Medical History:  Diagnosis Date   Arthritis    Diabetes mellitus without complication (HCC)    Hypertension    met colon ca to liver 01/2020    SURGICAL HISTORY: Past Surgical History:  Procedure Laterality Date   ABDOMINAL HYSTERECTOMY     ANTERIOR AND POSTERIOR REPAIR WITH SACROSPINOUS FIXATION N/A 07/16/2016   Procedure: ANTERIOR AND POSTERIOR REPAIR WITH SACROSPINOUS FIXATION;  Surgeon: Curlene Agent, MD;  Location: [facility];  Service: Gynecology;  Laterality: N/A;   CYSTOSCOPY  07/16/2016   Procedure: CYSTOSCOPY;  Surgeon: Curlene Agent, MD;  Location: [facility];  Service: Gynecology;;   ESOPHAGOGASTRODUODENOSCOPY (EGD) WITH PROPOFOL  N/A 12/22/2019   Procedure: ESOPHAGOGASTRODUODENOSCOPY (EGD) WITH PROPOFOL ;  Surgeon: [name], MD;  Location: [facility];  Service: Endoscopy;  Laterality: N/A;   FINE NEEDLE ASPIRATION N/A 12/22/2019   Procedure: FINE NEEDLE ASPIRATION (FNA) LINEAR;  Surgeon: [name], MD;  Location: [facility];  Service: Endoscopy;  Laterality: N/A;   IR IMAGING GUIDED PORT INSERTION  01/24/2020   IR REMOVAL TUN ACCESS W/ PORT W/O FL MOD SED  07/30/2023   LAPAROSCOPIC VAGINAL HYSTERECTOMY WITH SALPINGECTOMY Bilateral 07/16/2016   Procedure: LAPAROSCOPIC ASSISTED VAGINAL HYSTERECTOMY WITH SALPINGECTOMY;  Surgeon: Curlene Agent, MD;  Location: [facility];  Service: Gynecology;  Laterality: Bilateral;   LEFT HEART CATH AND CORONARY ANGIOGRAPHY N/A 05/07/2023   Procedure: LEFT HEART CATH AND CORONARY ANGIOGRAPHY;  Surgeon: Anner Alm ORN, MD;  Location: [facility];  Service: Cardiovascular;  Laterality: N/A;   MYOMECTOMY ABDOMINAL APPROACH     THYROID SURGERY     tyroid     UPPER ESOPHAGEAL ENDOSCOPIC ULTRASOUND (EUS) N/A 12/22/2019   Procedure: UPPER ESOPHAGEAL ENDOSCOPIC ULTRASOUND (EUS);  Surgeon: [name],  MD;  Location: [facility];  Service: Endoscopy;  Laterality: N/A;    I have reviewed the social history and family history with the patient and they are unchanged from previous note.  ALLERGIES:  is allergic to oxaliplatin .  MEDICATIONS:  Current Outpatient Medications  Medication Sig Dispense Refill   aspirin  EC 81 MG tablet Take 1 tablet (81 mg total) by mouth daily. Swallow whole. 90 tablet 0   atorvastatin  (LIPITOR) 80 MG tablet Take 1 tablet (80 mg total) by mouth daily. 90 tablet 0   clindamycin  (CLEOCIN -T) 1 % lotion Apply topically as needed. 60 mL 0   clopidogrel  (PLAVIX ) 75 MG tablet Take 1 tablet (75 mg total) by mouth daily. 90 tablet 0   doxycycline  (VIBRA -TABS) 100 MG tablet Take 1 tablet (100 mg total) by mouth 2 (two) times daily. 60 tablet 3   ezetimibe  (ZETIA ) 10 MG tablet Take 1 tablet (10 mg total) by mouth daily. 90 tablet 3   isosorbide  mononitrate (IMDUR ) 30 MG 24 hr tablet Take 1 tablet (30 mg total) by mouth every evening. 90 tablet 3   magnesium  oxide (MAG-OX) 400 (240 Mg) MG tablet Take 1 tablet (400 mg total) by mouth 2 (two) times daily. 60 tablet 1   metoprolol  succinate (TOPROL  XL) 25 MG 24 hr tablet Take 1 tablet (25 mg total) by mouth daily. Hold if systolic BP (top number) is less than 100 and/or heart rate is less than 55 90 tablet 3   nitroGLYCERIN  (NITROSTAT ) 0.4 MG SL tablet Place 1 tablet (0.4 mg total) under the tongue every 5 (five) minutes as needed for chest pain. 75 tablet 3   POTASSIUM CHLORIDE  PO Take by mouth every other day.     prochlorperazine  (COMPAZINE ) 10 MG tablet Take 1 tablet (10 mg total) by mouth every 6 (six) hours as needed for nausea or vomiting. 90 tablet 0   ranolazine  (RANEXA ) 500 MG 12 hr tablet Take 1 tablet (500 mg total) by mouth 2 (two) times daily. 180 tablet 3   No current facility-administered medications for this visit.    PHYSICAL EXAMINATION: ECOG PERFORMANCE STATUS: 1 - Symptomatic but completely  ambulatory  Vitals:   01/11/24 1104  BP: 128/70  Pulse: 67  Resp: 16  Temp: (!) 97.5 F (36.4 C)  SpO2: 98%   Wt Readings from Last 3 Encounters:  01/11/24 176 lb 14.4 oz (80.2 kg)  12/29/23 175 lb 3.2 oz (79.5 kg)  12/15/23 175 lb 8 oz (79.6 kg)     GENERAL:alert, no distress and comfortable SKIN: skin color, texture, turgor are normal, no rashes or significant lesions EYES: normal, Conjunctiva are pink and non-injected, sclera clear NECK: supple, thyroid normal size, non-tender, without nodularity LYMPH:  no palpable lymphadenopathy in the cervical, axillary  LUNGS: clear to auscultation and percussion with normal breathing effort HEART: regular rate & rhythm and no murmurs and no lower extremity edema ABDOMEN:abdomen soft, non-tender and normal bowel sounds Musculoskeletal:no cyanosis of digits and no clubbing  NEURO: alert & oriented x 3 with fluent speech, no focal motor/sensory deficits  Physical Exam    LABORATORY DATA:  I have reviewed the data as listed    Latest Ref Rng & Units 01/11/2024  10:16 AM 01/05/2024    2:54 PM 12/29/2023   10:44 AM  CBC  WBC 4.0 - 10.5 K/uL 5.9  5.4  5.2   Hemoglobin 12.0 - 15.0 g/dL 88.9  88.5  88.9   Hematocrit 36.0 - 46.0 % 33.7  34.3  34.1   Platelets 150 - 400 K/uL 270  277  254         Latest Ref Rng & Units 01/11/2024   10:16 AM 01/05/2024    2:54 PM 12/29/2023   10:44 AM  CMP  Glucose 70 - 99 mg/dL 879  824  873   BUN 8 - 23 mg/dL 8  9  8    Creatinine 0.44 - 1.00 mg/dL 9.41  9.39  9.39   Sodium 135 - 145 mmol/L 140  137  139   Potassium 3.5 - 5.1 mmol/L 3.6  3.4  3.6   Chloride 98 - 111 mmol/L 105  102  103   CO2 22 - 32 mmol/L 30  29  30    Calcium  8.9 - 10.3 mg/dL 9.3  9.3  9.6   Total Protein 6.5 - 8.1 g/dL 7.1  7.5  7.1   Total Bilirubin 0.0 - 1.2 mg/dL 0.3  0.4  0.4   Alkaline Phos 38 - 126 U/L 178  164  150   AST 15 - 41 U/L 22  19  16    ALT 0 - 44 U/L 27  26  15        RADIOGRAPHIC STUDIES: I have  personally reviewed the radiological images as listed and agreed with the findings in the report. No results found.    Orders Placed This Encounter  Procedures   CBC with Differential (Cancer Center Only)    Standing Status:   Future    Expected Date:   02/23/2024    Expiration Date:   02/22/2025   CMP (Cancer Center only)    Standing Status:   Future    Expected Date:   02/23/2024    Expiration Date:   02/22/2025   Magnesium     Standing Status:   Future    Expected Date:   02/23/2024    Expiration Date:   02/22/2025   CBC with Differential (Cancer Center Only)    Standing Status:   Future    Expected Date:   03/15/2024    Expiration Date:   03/15/2025   CMP (Cancer Center only)    Standing Status:   Future    Expected Date:   03/15/2024    Expiration Date:   03/15/2025   Magnesium     Standing Status:   Future    Expected Date:   03/15/2024    Expiration Date:   03/15/2025   All questions were answered. The patient knows to call the clinic with any problems, questions or concerns. No barriers to learning was detected. The total time spent in the appointment was 25 minutes, including review of chart and various tests results, discussions about plan of care and coordination of care plan     Onita Mattock, MD 01/11/2024

## 2024-01-11 NOTE — Progress Notes (Signed)
Treatment given per orders. Patient tolerated it well without problems. Vitals stable and discharged home from clinic ambulatory. Follow up as scheduled.  

## 2024-01-11 NOTE — Patient Instructions (Signed)
 [facility] - A DEPT OF [facility]  Discharge Instructions: Thank you for choosing [facility] to provide your oncology and hematology care.   If you have a lab appointment with the Cancer Center, please go directly to the Cancer Center and check in at the registration area.   Wear comfortable clothing and clothing appropriate for easy access to any Portacath or PICC line.   We strive to give you quality time with your provider. You may need to reschedule your appointment if you arrive late (15 or more minutes).  Arriving late affects you and other patients whose appointments are after yours.  Also, if you miss three or more appointments without notifying the office, you may be dismissed from the clinic at the provider's discretion.      For prescription refill requests, have your pharmacy contact our office and allow 72 hours for refills to be completed.    Today you received the following chemotherapy and/or immunotherapy agents vectibix , irinotecan , leucovorin , adrucil , and magnesium       To help prevent nausea and vomiting after your treatment, we encourage you to take your nausea medication as directed.  BELOW ARE SYMPTOMS THAT SHOULD BE REPORTED IMMEDIATELY: *FEVER GREATER THAN 100.4 F (38 C) OR HIGHER *CHILLS OR SWEATING *NAUSEA AND VOMITING THAT IS NOT CONTROLLED WITH YOUR NAUSEA MEDICATION *UNUSUAL SHORTNESS OF BREATH *UNUSUAL BRUISING OR BLEEDING *URINARY PROBLEMS (pain or burning when urinating, or frequent urination) *BOWEL PROBLEMS (unusual diarrhea, constipation, pain near the anus) TENDERNESS IN MOUTH AND THROAT WITH OR WITHOUT PRESENCE OF ULCERS (sore throat, sores in mouth, or a toothache) UNUSUAL RASH, SWELLING OR PAIN  UNUSUAL VAGINAL DISCHARGE OR ITCHING   Items with * indicate a potential emergency and should be followed up as soon as possible or go to the Emergency Department if any problems should occur.  Please show  the CHEMOTHERAPY ALERT CARD or IMMUNOTHERAPY ALERT CARD at check-in to the Emergency Department and triage nurse.  Should you have questions after your visit or need to cancel or reschedule your appointment, please contact [facility] - A DEPT OF [facility]  Dept: [phone]  and follow the prompts.  Office hours are 8:00 a.m. to 4:30 p.m. Monday - Friday. Please note that voicemails left after 4:00 p.m. may not be returned until the following business day.  We are closed weekends and major holidays. You have access to a nurse at all times for urgent questions. Please call the main number to the clinic Dept: [phone] and follow the prompts.   For any non-urgent questions, you may also contact your provider using MyChart. We now offer e-Visits for anyone 77 and older to request care online for non-urgent symptoms. For details visit [email or web address].   Also download the MyChart app! Go to the app store, search MyChart, open the app, select [facility], and log in with your MyChart username and password.

## 2024-01-11 NOTE — Assessment & Plan Note (Signed)
 MSS, [name]/[name] wildtype -diagnosed 12/2019 by porta hepatis LN biopsy during EUS for work up of abdominal pain and liver lesions seen on US  and MRI. Liver biopsy 01/08/20 confirmed metastasis from primary colorectal cancer. PET scan showed hypermetabolism to known liver mets, diffuse thoracic and abdominal lymphadenopathy, a 1.8 cm LUL pulmonary nodule, and splenic flexure of colon. -treated with first line FOLFOX 01/25/20 - 10/02/20, Vectibix  added with C2. Oxali discontinued after C18 due to reaction. -switched to Xeloda  10/21/20, dose adjusted due to significant skin toxicity -due to cancer progression, treatment has been changed to FOLFIRI and bevacizumab  on 03/25/22 -will repeat CT scan after next cycle chemo  -Restaging CT scan from 06/16/2022 showed stable to mild decrease in size of treated liver metastasis. No new lesions -Her restaging CT scan on August 31, 2022 showed stable liver metastasis, and a stable 7 mm left lung nodule, indeterminate.  -restaging CT in 06/2023 showed mild disease progression in liver, I changed Beva to vectibix   - Restaging CT scan on September 27, 2023 showed stable disease, will continue current treatment.

## 2024-01-11 NOTE — Assessment & Plan Note (Signed)
-  overall mild and stable

## 2024-01-12 ENCOUNTER — Telehealth: Payer: Self-pay | Admitting: Hematology

## 2024-01-12 NOTE — Telephone Encounter (Signed)
 LVM to regarding upcoming appts.

## 2024-01-13 ENCOUNTER — Other Ambulatory Visit (HOSPITAL_COMMUNITY): Payer: Medicare (Managed Care)

## 2024-01-13 ENCOUNTER — Inpatient Hospital Stay: Payer: Medicare (Managed Care)

## 2024-01-13 ENCOUNTER — Other Ambulatory Visit: Payer: Self-pay | Admitting: Radiology

## 2024-01-13 NOTE — H&P (Signed)
 Chief Complaint: Metastatic colon cancer; referred for Port-A-Cath placement to assist with treatment  Referring Provider(s): Feng,Y  Supervising Physician: Luverne Aran  Patient Status: [facility] - Out-pt  History of Present Illness: Lindsay [name] is a 68 y.o. female with past medical history significant for diabetes, arthritis, hypertension and metastatic colon cancer initially diagnosed in 2021 and currently undergoing chemotherapy.  She is known to IR team from liver lesion biopsy in 2021, Port-A-Cath placement in 2021 with removal on 07/30/2023 due to infection, PICC line placement on 07/30/2023.  She presents again today for new Port-A-Cath placement and PICC line removal.  *** Patient is Full Code  Past Medical History:  Diagnosis Date   Arthritis    Diabetes mellitus without complication (HCC)    Hypertension    met colon ca to liver 01/2020    Past Surgical History:  Procedure Laterality Date   ABDOMINAL HYSTERECTOMY     ANTERIOR AND POSTERIOR REPAIR WITH SACROSPINOUS FIXATION N/A 07/16/2016   Procedure: ANTERIOR AND POSTERIOR REPAIR WITH SACROSPINOUS FIXATION;  Surgeon: Curlene Agent, MD;  Location: [facility];  Service: Gynecology;  Laterality: N/A;   CYSTOSCOPY  07/16/2016   Procedure: CYSTOSCOPY;  Surgeon: Curlene Agent, MD;  Location: [facility];  Service: Gynecology;;   ESOPHAGOGASTRODUODENOSCOPY (EGD) WITH PROPOFOL  N/A 12/22/2019   Procedure: ESOPHAGOGASTRODUODENOSCOPY (EGD) WITH PROPOFOL ;  Surgeon: [name], MD;  Location: [facility];  Service: Endoscopy;  Laterality: N/A;   FINE NEEDLE ASPIRATION N/A 12/22/2019   Procedure: FINE NEEDLE ASPIRATION (FNA) LINEAR;  Surgeon: [name], MD;  Location: [facility];  Service: Endoscopy;  Laterality: N/A;   IR IMAGING GUIDED PORT INSERTION  01/24/2020   IR REMOVAL TUN ACCESS W/ PORT W/O FL MOD SED  07/30/2023   LAPAROSCOPIC VAGINAL HYSTERECTOMY WITH SALPINGECTOMY Bilateral 07/16/2016   Procedure: LAPAROSCOPIC ASSISTED  VAGINAL HYSTERECTOMY WITH SALPINGECTOMY;  Surgeon: Curlene Agent, MD;  Location: [facility];  Service: Gynecology;  Laterality: Bilateral;   LEFT HEART CATH AND CORONARY ANGIOGRAPHY N/A 05/07/2023   Procedure: LEFT HEART CATH AND CORONARY ANGIOGRAPHY;  Surgeon: Anner Alm ORN, MD;  Location: [facility] INVASIVE CV LAB;  Service: Cardiovascular;  Laterality: N/A;   MYOMECTOMY ABDOMINAL APPROACH     THYROID SURGERY     tyroid     UPPER ESOPHAGEAL ENDOSCOPIC ULTRASOUND (EUS) N/A 12/22/2019   Procedure: UPPER ESOPHAGEAL ENDOSCOPIC ULTRASOUND (EUS);  Surgeon: [name], MD;  Location: [facility];  Service: Endoscopy;  Laterality: N/A;    Allergies: Oxaliplatin   Medications: Prior to Admission medications   Medication Sig Start Date End Date Taking? Authorizing Provider  aspirin  EC 81 MG tablet Take 1 tablet (81 mg total) by mouth daily. Swallow whole. 05/09/23   Amin, Ankit C, MD  atorvastatin  (LIPITOR) 80 MG tablet Take 1 tablet (80 mg total) by mouth daily. 05/09/23   Amin, Ankit C, MD  clindamycin  (CLEOCIN -T) 1 % lotion Apply topically as needed. 08/11/23   Thayil, Irene T, PA-C  clopidogrel  (PLAVIX ) 75 MG tablet Take 1 tablet (75 mg total) by mouth daily. 05/09/23   Amin, Ankit C, MD  doxycycline  (VIBRA -TABS) 100 MG tablet Take 1 tablet (100 mg total) by mouth 2 (two) times daily. 08/11/23   Thayil, Irene T, PA-C  ezetimibe  (ZETIA ) 10 MG tablet Take 1 tablet (10 mg total) by mouth daily. 11/16/23   Tolia, Sunit, DO  isosorbide  mononitrate (IMDUR ) 30 MG 24 hr tablet Take 1 tablet (30 mg total) by mouth every evening. 11/16/23   Tolia, Sunit, DO  magnesium  oxide (  MAG-OX) 400 (240 Mg) MG tablet Take 1 tablet (400 mg total) by mouth 2 (two) times daily. 11/17/23   [name], MD  metoprolol  succinate (TOPROL  XL) 25 MG 24 hr tablet Take 1 tablet (25 mg total) by mouth daily. Hold if systolic BP (top number) is less than 100 and/or heart rate is less than 55 08/13/23   Tolia, Sunit, DO  nitroGLYCERIN  (NITROSTAT ) 0.4 MG  SL tablet Place 1 tablet (0.4 mg total) under the tongue every 5 (five) minutes as needed for chest pain. 10/11/23   Tolia, Sunit, DO  POTASSIUM CHLORIDE  PO Take by mouth every other day.    [provider]  prochlorperazine  (COMPAZINE ) 10 MG tablet Take 1 tablet (10 mg total) by mouth every 6 (six) hours as needed for nausea or vomiting. 12/01/23   Neomi Lis T, PA-C  ranolazine  (RANEXA ) 500 MG 12 hr tablet Take 1 tablet (500 mg total) by mouth 2 (two) times daily. 10/11/23   [name], DO     Family History  Problem Relation Age of Onset   Aneurysm Mother    Asthma Father     Social History   Socioeconomic History   Marital status: Married    Spouse name: Not on file   Number of children: 1   Years of education: Not on file   Highest education level: Not on file  Occupational History   Occupation: retired location manager   Tobacco Use   Smoking status: Former    Current packs/day: 0.00    Average packs/day: 0.3 packs/day for 18.0 years (4.5 ttl pk-yrs)    Types: Cigarettes    Start date: 06/05/2000    Quit date: 06/06/2018    Years since quitting: 5.6   Smokeless tobacco: Never  Vaping Use   Vaping status: Never Used  Substance and Sexual Activity   Alcohol use: Yes    Alcohol/week: 3.0 standard drinks of alcohol    Types: 3 Shots of liquor per week    Comment: socially   Drug use: No   Sexual activity: Yes  Other Topics Concern   Not on file  Social History Narrative   Not on file   Social Drivers of Health   Financial Resource Strain: Not on file  Food Insecurity: No Food Insecurity (09/07/2023)   Hunger Vital Sign    Worried About Running Out of Food in the Last Year: Never true    Ran Out of Food in the Last Year: Never true  Transportation Needs: No Transportation Needs (09/07/2023)   PRAPARE - Administrator, Civil Service (Medical): No    Lack of Transportation (Non-Medical): No  Physical Activity: Not on file  Stress: Not on file   Social Connections: Moderately Integrated (05/06/2023)   Social Connection and Isolation Panel    Frequency of Communication with Friends and Family: Once a week    Frequency of Social Gatherings with Friends and Family: Once a week    Attends Religious Services: 1 to 4 times per year    Active Member of [employer] or Organizations: No    Attends Engineer, Structural: 1 to 4 times per year    Marital Status: Married       Review of Systems  Vital Signs:   Advance Care Plan: No documents on file   Physical Exam  Imaging: No results found.  Labs:  CBC: Recent Labs    12/15/23 1102 12/29/23 1044 01/05/24 1454 01/11/24 1016  WBC 4.8  5.2 5.4 5.9  HGB 10.6* 11.0* 11.4* 11.0*  HCT 32.6* 34.1* 34.3* 33.7*  PLT 288 254 277 270    COAGS: No results for input(s): INR, APTT in the last 8760 hours.  BMP: Recent Labs    12/15/23 1102 12/29/23 1044 01/05/24 1454 01/11/24 1016  NA 138 139 137 140  K 4.0 3.6 3.4* 3.6  CL 103 103 102 105  CO2 31 30 29 30   GLUCOSE 117* 126* 175* 120*  BUN 7* 8 9 8   CALCIUM  9.5 9.6 9.3 9.3  CREATININE 0.62 0.60 0.60 0.58  GFRNONAA >60 >60 >60 >60    LIVER FUNCTION TESTS: Recent Labs    12/15/23 1102 12/29/23 1044 01/05/24 1454 01/11/24 1016  BILITOT 0.4 0.4 0.4 0.3  AST 18 16 19 22   ALT 20 15 26 27   ALKPHOS 160* 150* 164* 178*  PROT 7.2 7.1 7.5 7.1  ALBUMIN 3.9 4.0 4.1 4.0    TUMOR MARKERS: Recent Labs    11/17/23 0854 12/01/23 0844 12/15/23 1115 01/05/24 1454  CEA 45.05* 53.84* 77.45* 108.17*    Assessment and Plan: 68 y.o. female with past medical history significant for diabetes, arthritis, hypertension and metastatic colon cancer initially diagnosed in 2021 and currently undergoing chemotherapy.  She is known to IR team from liver lesion biopsy in 2021, Port-A-Cath placement in 2021 with removal on 07/30/2023 due to infection, PICC line placement on 07/30/2023.  She presents again today for new  Port-A-Cath placement and PICC line removal.Risks and benefits of image guided port-a-catheter placement was discussed with the patient including, but not limited to bleeding, infection, pneumothorax, or fibrin sheath development and need for additional procedures.  All of the patient's questions were answered, patient is agreeable to proceed. Consent signed and in chart.    Thank you for allowing our service to participate in YAZMYN VALBUENA 's care.  Electronically Signed: D. Franky Rakers, PA-C   01/13/2024, 6:26 PM      I spent a total of   20 minutes  in face to face in clinical consultation, greater than 50% of which was counseling/coordinating care for port a cath placement

## 2024-01-14 ENCOUNTER — Encounter (HOSPITAL_COMMUNITY): Payer: Self-pay

## 2024-01-14 ENCOUNTER — Ambulatory Visit (HOSPITAL_COMMUNITY)
Admission: RE | Admit: 2024-01-14 | Discharge: 2024-01-14 | Disposition: A | Discharge status: Home / Self Care | Payer: Medicare (Managed Care) | Source: Ambulatory Visit | Attending: Internal Medicine | Admitting: Internal Medicine

## 2024-01-14 ENCOUNTER — Ambulatory Visit (HOSPITAL_COMMUNITY)
Admission: RE | Admit: 2024-01-14 | Discharge: 2024-01-14 | Disposition: A | Discharge status: Home / Self Care | Payer: Medicare (Managed Care) | Source: Ambulatory Visit | Attending: Hematology | Admitting: Hematology

## 2024-01-14 ENCOUNTER — Other Ambulatory Visit: Payer: Self-pay

## 2024-01-14 DIAGNOSIS — C787 Secondary malignant neoplasm of liver and intrahepatic bile duct: Secondary | ICD-10-CM | POA: Diagnosis not present

## 2024-01-14 DIAGNOSIS — Z87891 Personal history of nicotine dependence: Secondary | ICD-10-CM | POA: Insufficient documentation

## 2024-01-14 DIAGNOSIS — I10 Essential (primary) hypertension: Secondary | ICD-10-CM | POA: Insufficient documentation

## 2024-01-14 DIAGNOSIS — C189 Malignant neoplasm of colon, unspecified: Secondary | ICD-10-CM | POA: Diagnosis present

## 2024-01-14 DIAGNOSIS — E119 Type 2 diabetes mellitus without complications: Secondary | ICD-10-CM | POA: Insufficient documentation

## 2024-01-14 DIAGNOSIS — Z79899 Other long term (current) drug therapy: Secondary | ICD-10-CM | POA: Insufficient documentation

## 2024-01-14 DIAGNOSIS — C221 Intrahepatic bile duct carcinoma: Secondary | ICD-10-CM

## 2024-01-14 HISTORY — PX: IR IMAGING GUIDED PORT INSERTION: IMG5740

## 2024-01-14 MED ORDER — CEFAZOLIN SODIUM-DEXTROSE 2-4 GM/100ML-% IV SOLN
INTRAVENOUS | Status: AC | PRN
Start: 2024-01-14 — End: 2024-01-14
  Administered 2024-01-14: 2 g via INTRAVENOUS

## 2024-01-14 MED ORDER — MIDAZOLAM HCL (PF) 2 MG/2ML IJ SOLN
INTRAMUSCULAR | Status: AC | PRN
  Administered 2024-01-14: 1 mg via INTRAVENOUS

## 2024-01-14 MED ORDER — FENTANYL CITRATE (PF) 100 MCG/2ML IJ SOLN
INTRAMUSCULAR | Status: AC
  Filled 2024-01-14: qty 2

## 2024-01-14 MED ORDER — HEPARIN SOD (PORK) LOCK FLUSH 100 UNIT/ML IV SOLN
INTRAVENOUS | Status: AC
  Filled 2024-01-14: qty 5

## 2024-01-14 MED ORDER — LIDOCAINE HCL 1 % IJ SOLN
INTRAMUSCULAR | Status: AC
Start: 2024-01-14 — End: 2024-01-14
  Filled 2024-01-14: qty 20

## 2024-01-14 MED ORDER — LIDOCAINE HCL 1 % IJ SOLN
20.0000 mL | Freq: Once | INTRAMUSCULAR | Status: AC
  Administered 2024-01-14: 20 mL via INTRADERMAL

## 2024-01-14 MED ORDER — SODIUM CHLORIDE 0.9 % IV SOLN: INTRAVENOUS | Status: DC

## 2024-01-14 MED ORDER — HEPARIN SOD (PORK) LOCK FLUSH 100 UNIT/ML IV SOLN
500.0000 [IU] | Freq: Once | INTRAVENOUS | Status: AC
  Administered 2024-01-14: 500 [IU] via INTRAVENOUS

## 2024-01-14 MED ORDER — CEFAZOLIN SODIUM-DEXTROSE 2-4 GM/100ML-% IV SOLN
INTRAVENOUS | Status: AC
Start: 2024-01-14 — End: 2024-01-14
  Filled 2024-01-14: qty 100

## 2024-01-14 MED ORDER — MIDAZOLAM HCL 2 MG/2ML IJ SOLN
INTRAMUSCULAR | Status: AC
  Filled 2024-01-14: qty 2

## 2024-01-14 MED ORDER — FENTANYL CITRATE (PF) 100 MCG/2ML IJ SOLN
INTRAMUSCULAR | Status: AC | PRN
  Administered 2024-01-14 (×2): 50 ug via INTRAVENOUS

## 2024-01-14 NOTE — Sedation Documentation (Signed)
 RN Rosina Essex pulled 2mg  Versed  and 100mcg Fentanyl  in IR Room 1 Pyxis. Pt. Received 2mg  Versed  and 100mcg Fentanyl  throughout the procedure.

## 2024-01-14 NOTE — Discharge Instructions (Signed)
Discharge Instructions:   Please call Interventional Radiology clinic [phone] with any questions or concerns.  You may remove your dressing and shower tomorrow.  Do not use EMLA / Lidocaine cream for 2 weeks post Port Insertion this will remove the surgical glue.  Moderate Conscious Sedation, Adult, Care After This sheet gives you information about how to care for yourself after your procedure. Your health care provider may also give you more specific instructions. If you have problems or questions, contact your health care provider. What can I expect after the procedure? After the procedure, it is common to have: Sleepiness for several hours. Impaired judgment for several hours. Difficulty with balance. Vomiting if you eat too soon. Follow these instructions at home: For the time period you were told by your health care provider: Rest. Do not participate in activities where you could fall or become injured. Do not drive or use machinery. Do not drink alcohol. Do not take sleeping pills or medicines that cause drowsiness. Do not make important decisions or sign legal documents. Do not take care of children on your own. Eating and drinking  Follow the diet recommended by your health care provider. Drink enough fluid to keep your urine pale yellow. If you vomit: Drink water, juice, or soup when you can drink without vomiting. Make sure you have little or no nausea before eating solid foods. General instructions Take over-the-counter and prescription medicines only as told by your health care provider. Have a responsible adult stay with you for the time you are told. It is important to have someone help care for you until you are awake and alert. Do not smoke. Keep all follow-up visits as told by your health care provider. This is important. Contact a health care provider if: You are still sleepy or having trouble with balance after 24 hours. You feel light-headed. You keep  feeling nauseous or you keep vomiting. You develop a rash. You have a fever. You have redness or swelling around the IV site. Get help right away if: You have trouble breathing. You have new-onset confusion at home. Summary After the procedure, it is common to feel sleepy, have impaired judgment, or feel nauseous if you eat too soon. Rest after you get home. Know the things you should not do after the procedure. Follow the diet recommended by your health care provider and drink enough fluid to keep your urine pale yellow. Get help right away if you have trouble breathing or new-onset confusion at home. This information is not intended to replace advice given to you by your health care provider. Make sure you discuss any questions you have with your health care provider. Document Revised: 06/23/2019 Document Reviewed: 01/19/2019 Elsevier Patient Education  2023 Elsevier Inc.  Implanted Port Insertion, Care After The following information offers guidance on how to care for yourself after your procedure. Your health care provider may also give you more specific instructions. If you have problems or questions, contact your health care provider. What can I expect after the procedure? After the procedure, it is common to have: Discomfort at the port insertion site. Bruising on the skin over the port. This should improve over 3-4 days. Follow these instructions at home: Port care After your port is placed, you will get a manufacturer's information card. The card has information about your port. Keep this card with you at all times. Take care of the port as told by your health care provider. Ask your health care provider if you or   a family member can get training for taking care of the port at home. A home health care nurse will be be available to help care for the port. Make sure to remember what type of port you have. Incision care     Follow instructions from your health care provider  about how to take care of your port insertion site. Make sure you: Wash your hands with soap and water for at least 20 seconds before and after you change your bandage (dressing). If soap and water are not available, use hand sanitizer. Change your dressing as told by your health care provider. Leave stitches (sutures), skin glue, or adhesive strips in place. These skin closures may need to stay in place for 2 weeks or longer. If adhesive strip edges start to loosen and curl up, you may trim the loose edges. Do not remove adhesive strips completely unless your health care provider tells you to do that. Check your port insertion site every day for signs of infection. Check for: Redness, swelling, or pain. Fluid or blood. Warmth. Pus or a bad smell. Activity Return to your normal activities as told by your health care provider. Ask your health care provider what activities are safe for you. You may have to avoid lifting. Ask your health care provider how much you can safely lift. General instructions Take over-the-counter and prescription medicines only as told by your health care provider. Do not take baths, swim, or use a hot tub until your health care provider approves. Ask your health care provider if you may take showers. You may only be allowed to take sponge baths. If you were given a sedative during the procedure, it can affect you for several hours. Do not drive or operate machinery until your health care provider says that it is safe. Wear a medical alert bracelet in case of an emergency. This will tell any health care providers that you have a port. Keep all follow-up visits. This is important. Contact a health care provider if: You cannot flush your port with saline as directed, or you cannot draw blood from the port. You have a fever or chills. You have redness, swelling, or pain around your port insertion site. You have fluid or blood coming from your port insertion site. Your port  insertion site feels warm to the touch. You have pus or a bad smell coming from the port insertion site. Get help right away if: You have chest pain or shortness of breath. You have bleeding from your port that you cannot control. These symptoms may be an emergency. Get help right away. Call 911. Do not wait to see if the symptoms will go away. Do not drive yourself to the hospital. Summary Take care of the port as told by your health care provider. Keep the manufacturer's information card with you at all times. Change your dressing as told by your health care provider. Contact a health care provider if you have a fever or chills or if you have redness, swelling, or pain around your port insertion site. Keep all follow-up visits. This information is not intended to replace advice given to you by your health care provider. Make sure you discuss any questions you have with your health care provider. Document Revised: 08/27/2020 Document Reviewed: 08/27/2020 Elsevier Patient Education  2023 Elsevier Inc.  

## 2024-01-14 NOTE — Procedures (Signed)
 Interventional Radiology Procedure Note  Procedure: Single Lumen Power Port Placement    Access:  Right IJ vein.  Findings: Catheter tip positioned at SVC/RA junction. Port is ready for immediate use.   Complications: None  EBL: < 10 mL  Recommendations:  - Ok to shower in 24 hours - Do not submerge for 7 days - Routine line care   Maxi Carreras T. Fredia Sorrow, M.D Pager:  [phone]

## 2024-01-14 NOTE — Progress Notes (Signed)
 1600 Ice bag given to use as needed for comfort to right upper neck and right upper chest as instructed.

## 2024-01-18 IMAGING — CT CT CHEST-ABD-PELV W/ CM
3 of 5 series · 14 of 36 positions shown, 16 images · IV contrast (agent unspecified)
Comparison: None.

CLINICAL DATA: Colon cancer restaging; * Tracking Code: BO *

EXAM:
CT CHEST, ABDOMEN, AND PELVIS WITH CONTRAST
TECHNIQUE: Multidetector CT imaging of the chest, abdomen and pelvis was
performed following the standard protocol during bolus
administration of intravenous contrast.

[Series 2: cap with · axial · 0.82mm/px · z∈[+1136,+1616]mm · 9 of 120 slices shown, 11 images]
[im 12/120  mediastinal]
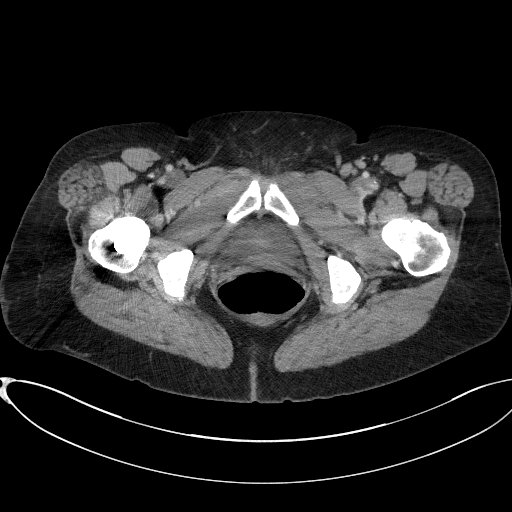
[im 12/120  bone]
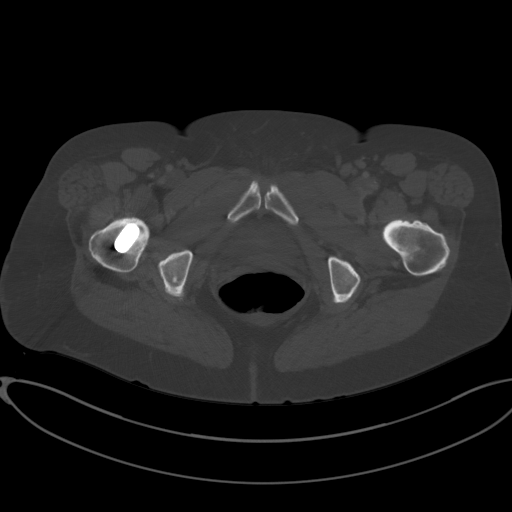
[im 24/120  mediastinal]
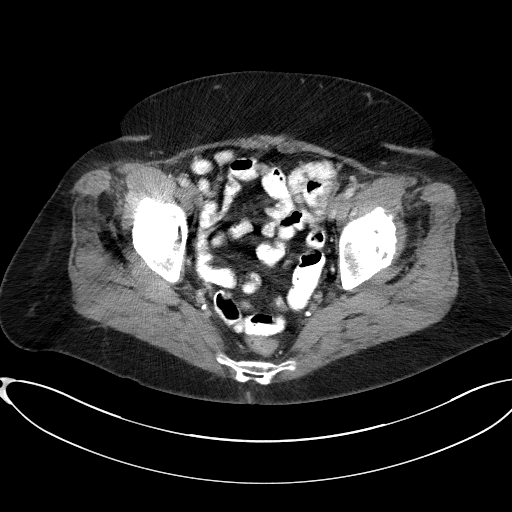
[im 36/120  mediastinal]
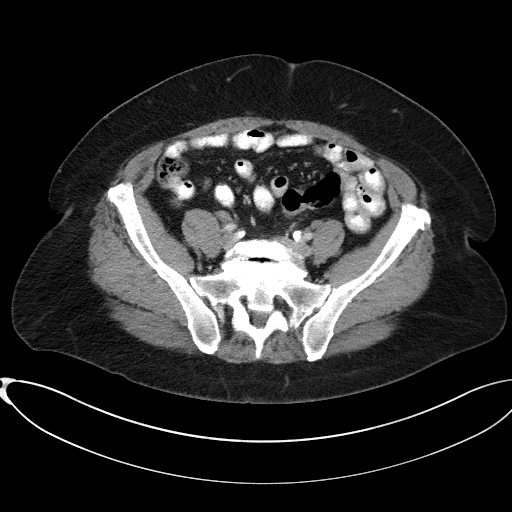
[im 48/120  mediastinal]
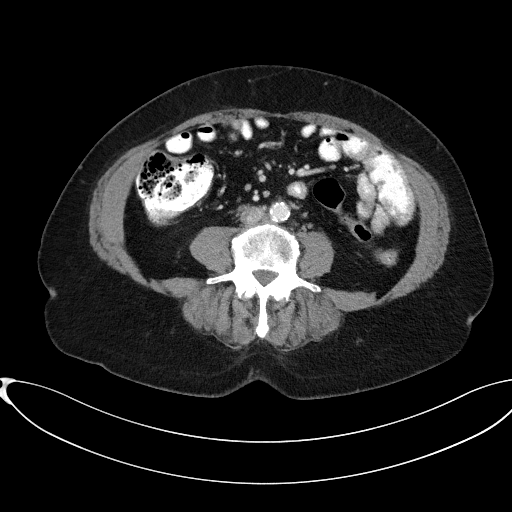
[im 60/120  mediastinal]
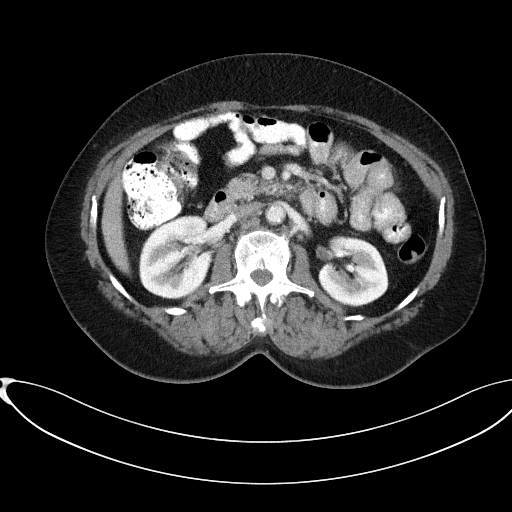
[im 72/120  mediastinal]
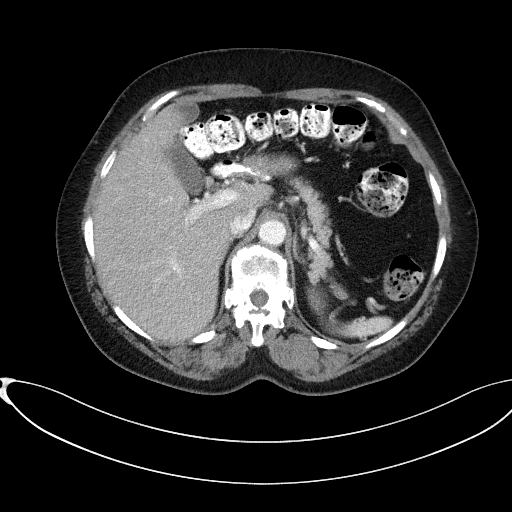
[im 84/120  mediastinal]
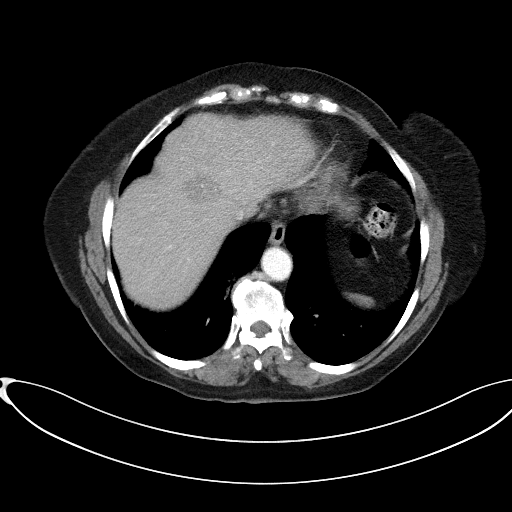
[im 96/120  mediastinal]
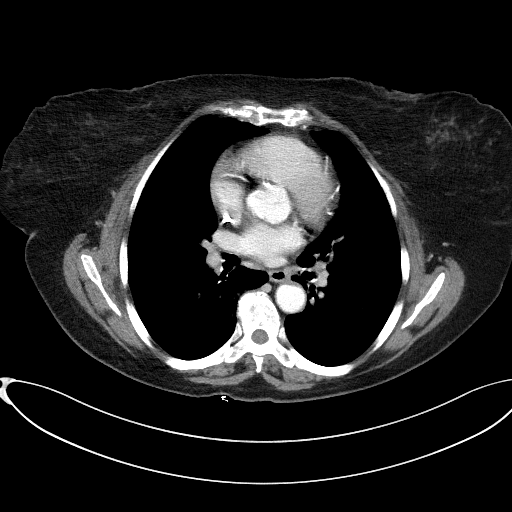
[im 108/120  mediastinal]
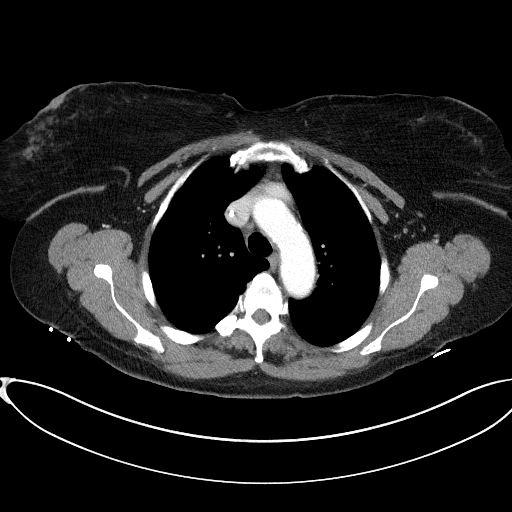
[im 108/120  bone]
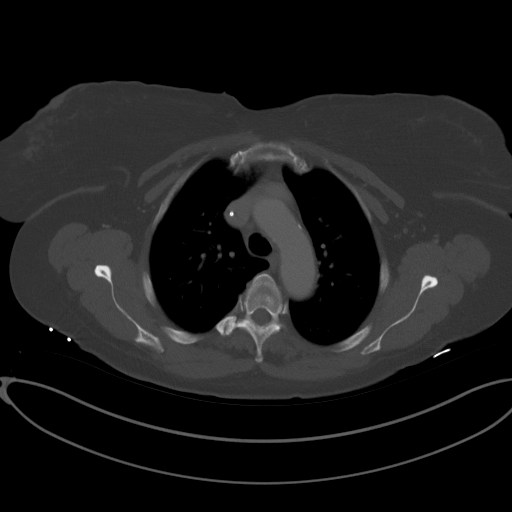

[Series 4: lung · axial · 0.82mm/px · z∈[+1432,+1476]mm · 2 of 134 slices shown]
[im 12/134  bone]
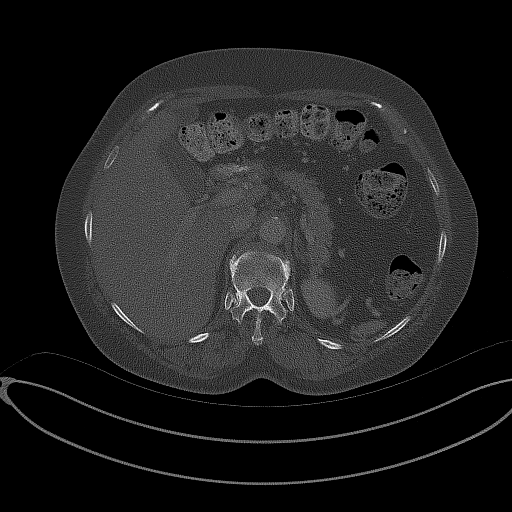
[im 34/134  bone]
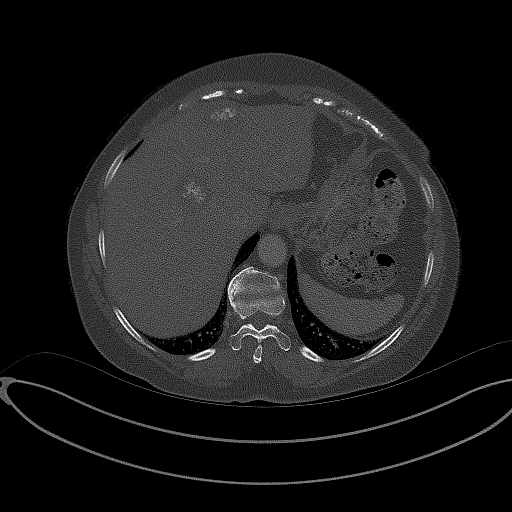

[Series 6: coronals · coronal · 0.76mm/px · 3 of 147 slices shown]
[im 30/147  mediastinal]
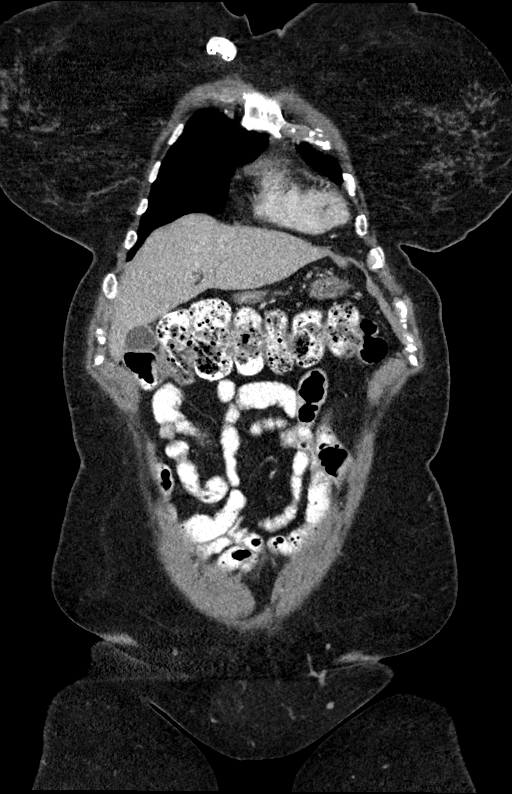
[im 59/147  mediastinal]
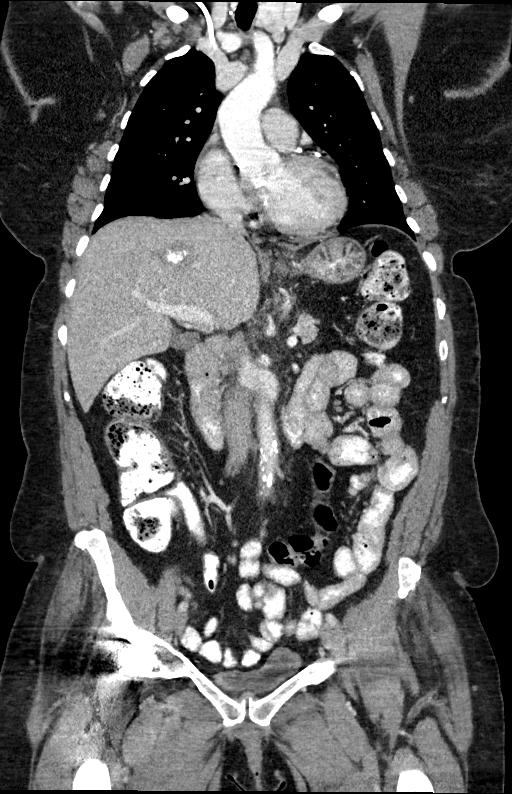
[im 88/147  mediastinal]
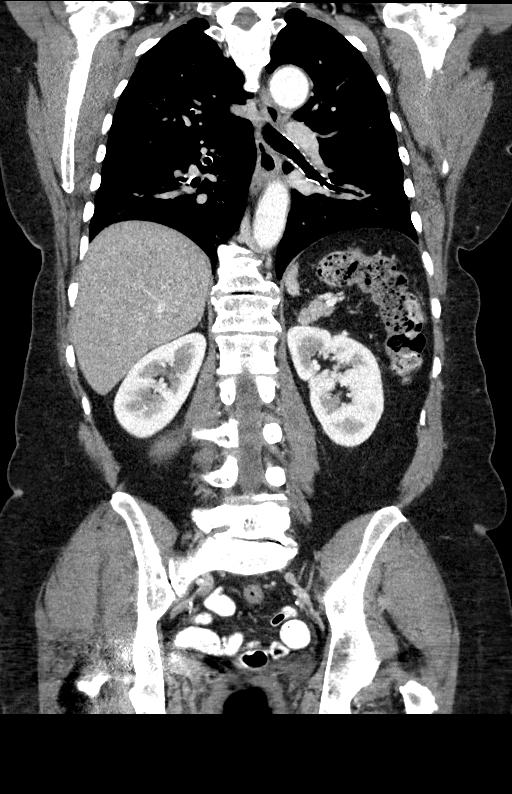

[14 of 36 positions shown; findings below may reference images not displayed]

RADIATION DOSE REDUCTION: This exam was performed according to the
departmental dose-optimization program which includes automated
exposure control, adjustment of the mA and/or kV according to
patient size and/or use of iterative reconstruction technique.

CONTRAST:  100mL OMNIPAQUE IOHEXOL 300 MG/ML  SOLN
FINDINGS: CT CHEST FINDINGS

Cardiovascular: Normal heart size. No pericardial effusion. Left
main and three-vessel coronary artery calcifications.
Atherosclerotic disease of the thoracic aorta. Right chest wall port
with tip positioned near the superior cavoatrial junction.

Mediastinum/Nodes: Esophagus is unremarkable. Prior right
thyroidectomy. No pathologically enlarged lymph nodes seen in the
chest.

Lungs/Pleura: Central airways are patent. No consolidation, pleural
effusion or pneumothorax. Subsolid nodule of the left upper lobe
measuring 9 x 5 mm with adjacent cystic change located on series 4,
image 39, unchanged in size when compared to prior exam.

Musculoskeletal: No chest wall mass or suspicious bone lesions
identified.

CT ABDOMEN PELVIS FINDINGS

Hepatobiliary: Centrally calcified liver lesions are stable in size
when compared with prior exam. Largest lesion located in the central
liver measures 4.1 x 4.3 cm on series 2, image 40, unchanged when
compared to prior exam.

Pancreas: Unremarkable. No pancreatic ductal dilatation or
surrounding inflammatory changes.

Spleen: Normal in size without focal abnormality.

Adrenals/Urinary Tract: Adrenal glands are unremarkable. Kidneys are
normal, without renal calculi, focal lesion, or hydronephrosis.
Bladder is unremarkable.

Stomach/Bowel: Stomach is within normal limits. Appendix appears
normal. No evidence of bowel wall thickening, distention, or
inflammatory changes.

Vascular/Lymphatic: Enlarged upper abdominal lymph nodes are
unchanged in size when compared with prior exam. Reference
gastrohepatic lymph node measuring 9 mm in short axis on series 2
image 39, unchanged in size when compared to prior exam. Reference
portacaval lymph node measuring 1.4 cm in short axis on series 2,
image 83, unchanged in size when compared with prior exam.
Atherosclerotic disease of the abdominal aorta.

Reproductive: Status post hysterectomy.  No adnexal masses.

Other: No abdominopelvic ascites.

Musculoskeletal: Degenerative changes of the bilateral acetabula.
Prior right total hip arthroplasty. No suspicious osseous lesions.
IMPRESSION: 1. No evidence of new metastatic disease in the chest, abdomen or
pelvis.
2. Stable subsolid nodule of the peripheral left upper lobe.
3. Stable centrally calcified liver lesions.
4. Unchanged enlarged lymph nodes in the upper abdomen.
5. Coronary artery calcifications and aortic Atherosclerosis
([id number]).

## 2024-01-19 ENCOUNTER — Ambulatory Visit (HOSPITAL_COMMUNITY)
Admission: RE | Admit: 2024-01-19 | Discharge: 2024-01-19 | Disposition: A | Discharge status: Home / Self Care | Payer: Medicare (Managed Care) | Source: Ambulatory Visit | Attending: Hematology | Admitting: Hematology

## 2024-01-19 DIAGNOSIS — C221 Intrahepatic bile duct carcinoma: Secondary | ICD-10-CM | POA: Insufficient documentation

## 2024-01-19 MED ORDER — IOHEXOL 9 MG/ML PO SOLN
500.0000 mL | ORAL | Status: AC
  Administered 2024-01-19 (×2): 500 mL via ORAL

## 2024-01-19 MED ORDER — IOHEXOL 9 MG/ML PO SOLN
ORAL | Status: AC
  Filled 2024-01-19: qty 1000

## 2024-01-19 MED ORDER — IOHEXOL 300 MG/ML  SOLN
100.0000 mL | Freq: Once | INTRAMUSCULAR | Status: AC | PRN
  Administered 2024-01-19: 100 mL via INTRAVENOUS

## 2024-01-25 NOTE — Assessment & Plan Note (Signed)
 MSS, [name]/[name] wildtype -diagnosed 12/2019 by porta hepatis LN biopsy during EUS for work up of abdominal pain and liver lesions seen on US  and MRI. Liver biopsy 01/08/20 confirmed metastasis from primary colorectal cancer. PET scan showed hypermetabolism to known liver mets, diffuse thoracic and abdominal lymphadenopathy, a 1.8 cm LUL pulmonary nodule, and splenic flexure of colon. -treated with first line FOLFOX 01/25/20 - 10/02/20, Vectibix  added with C2. Oxali discontinued after C18 due to reaction. -switched to Xeloda  10/21/20, dose adjusted due to significant skin toxicity -due to cancer progression, treatment has been changed to FOLFIRI and bevacizumab  on 03/25/22 -will repeat CT scan after next cycle chemo  -Restaging CT scan from 06/16/2022 showed stable to mild decrease in size of treated liver metastasis. No new lesions -Her restaging CT scan on August 31, 2022 showed stable liver metastasis, and a stable 7 mm left lung nodule, indeterminate.  -restaging CT in 06/2023 showed mild disease progression in liver, I changed Beva to vectibix   - Restaging CT scan on September 27, 2023 showed stable disease -unfortunately restaging CT 01/21/2024 showed cancer progression in liver, her CEA has also significantly increased lately  -plan to change tx to Lonsurf and Beva

## 2024-01-26 ENCOUNTER — Telehealth: Payer: Self-pay

## 2024-01-26 ENCOUNTER — Telehealth: Payer: Self-pay | Admitting: Pharmacist

## 2024-01-26 ENCOUNTER — Inpatient Hospital Stay: Payer: Medicare (Managed Care)

## 2024-01-26 ENCOUNTER — Other Ambulatory Visit (HOSPITAL_COMMUNITY): Payer: Self-pay

## 2024-01-26 ENCOUNTER — Inpatient Hospital Stay: Payer: Medicare (Managed Care) | Admitting: Hematology

## 2024-01-26 VITALS — BP 153/100 | HR 63 | Temp 97.5°F | Resp 17 | Ht 66.0 in | Wt 173.4 lb

## 2024-01-26 DIAGNOSIS — Z5112 Encounter for antineoplastic immunotherapy: Secondary | ICD-10-CM | POA: Diagnosis not present

## 2024-01-26 DIAGNOSIS — C221 Intrahepatic bile duct carcinoma: Secondary | ICD-10-CM

## 2024-01-26 DIAGNOSIS — Z95828 Presence of other vascular implants and grafts: Secondary | ICD-10-CM | POA: Diagnosis not present

## 2024-01-26 LAB — CBC WITH DIFFERENTIAL (CANCER CENTER ONLY)
Abs Immature Granulocytes: 0.01 K/uL (ref 0.00–0.07)
Basophils Absolute: 0.1 K/uL (ref 0.0–0.1)
Basophils Relative: 1 %
Eosinophils Absolute: 0.2 K/uL (ref 0.0–0.5)
Eosinophils Relative: 4 %
HCT: 35.1 % — ABNORMAL LOW (ref 36.0–46.0)
Hemoglobin: 11.4 g/dL — ABNORMAL LOW (ref 12.0–15.0)
Immature Granulocytes: 0 %
Lymphocytes Relative: 22 %
Lymphs Abs: 1.3 K/uL (ref 0.7–4.0)
MCH: 28.4 pg (ref 26.0–34.0)
MCHC: 32.5 g/dL (ref 30.0–36.0)
MCV: 87.3 fL (ref 80.0–100.0)
Monocytes Absolute: 0.6 K/uL (ref 0.1–1.0)
Monocytes Relative: 10 %
Neutro Abs: 3.6 K/uL (ref 1.7–7.7)
Neutrophils Relative %: 63 %
Platelet Count: 295 K/uL (ref 150–400)
RBC: 4.02 MIL/uL (ref 3.87–5.11)
RDW: 17 % — ABNORMAL HIGH (ref 11.5–15.5)
WBC Count: 5.7 K/uL (ref 4.0–10.5)
nRBC: 0 % (ref 0.0–0.2)

## 2024-01-26 LAB — CMP (CANCER CENTER ONLY)
ALT: 19 U/L (ref 0–44)
AST: 24 U/L (ref 15–41)
Albumin: 4.3 g/dL (ref 3.5–5.0)
Alkaline Phosphatase: 219 U/L — ABNORMAL HIGH (ref 38–126)
Anion gap: 12 (ref 5–15)
BUN: 10 mg/dL (ref 8–23)
CO2: 27 mmol/L (ref 22–32)
Calcium: 9.8 mg/dL (ref 8.9–10.3)
Chloride: 99 mmol/L (ref 98–111)
Creatinine: 0.63 mg/dL (ref 0.44–1.00)
GFR, Estimated: >60 mL/min (ref >60)
Glucose, Bld: 142 mg/dL — ABNORMAL HIGH (ref 70–99)
Potassium: 3.5 mmol/L (ref 3.5–5.1)
Sodium: 138 mmol/L (ref 135–145)
Total Bilirubin: 0.4 mg/dL (ref 0.0–1.2)
Total Protein: 7.7 g/dL (ref 6.5–8.1)

## 2024-01-26 LAB — MAGNESIUM: Magnesium: 1.6 mg/dL — ABNORMAL LOW (ref 1.7–2.4)

## 2024-01-26 MED ORDER — SODIUM CHLORIDE 0.9% FLUSH
10.0000 mL | Freq: Once | INTRAVENOUS | Status: AC
  Administered 2024-01-26: 10 mL via INTRAVENOUS

## 2024-01-26 MED ORDER — LONSURF 20-8.19 MG PO TABS
35.0000 mg/m2 | ORAL_TABLET | Freq: Two times a day (BID) | ORAL | 1 refills | Status: DC
  Filled 2024-02-09: qty 60, 10d supply, fill #0

## 2024-01-26 NOTE — Progress Notes (Signed)
 DISCONTINUE ON PATHWAY REGIMEN - Colorectal     A cycle is every 14 days:     Bevacizumab -xxxx      Irinotecan       Leucovorin       Fluorouracil       Fluorouracil    **Always confirm dose/schedule in your pharmacy ordering system**  PRIOR TREATMENT: [id number]: FOLFIRI + Bevacizumab  q14 Days  START ON PATHWAY REGIMEN - Colorectal     A cycle is every 28 days:     Trifluridine and tipiracil      Bevacizumab -xxxx   **Always confirm dose/schedule in your pharmacy ordering system**  Patient Characteristics: Distant Metastases, Nonsurgical Candidate, [name]/[name] Wild-Type (BRAF V600 Wild-Type/Unknown), Standard Cytotoxic Therapy, Third Line Standard Cytotoxic Therapy, Prior Anti-EGFR Therapy Tumor Location: Colon Therapeutic Status: Distant Metastases Microsatellite/Mismatch Repair Status: MSS/pMMR BRAF Mutation Status: Wild-Type (no mutation) [name]/[name] Mutation Status: Wild-Type (no mutation) Preferred Therapy Approach: Standard Cytotoxic Therapy Standard Cytotoxic Line of Therapy: Third Careers Adviser Cytotoxic Therapy Intent of Therapy: Non-Curative / Palliative Intent, Discussed with Patient

## 2024-01-26 NOTE — Progress Notes (Signed)
 [facility]   Telephone:([phone]) [phone] Fax:(336) [phone]   Clinic Follow up Note   Patient Care Team: [name], MD as PCP - General (Internal Medicine) [name], DO as PCP - Cardiology (Cardiology) [name], MD as Consulting Physician (Oncology) [name], MD as Consulting Physician (Gastroenterology)  Date of Service:  01/26/2024  CHIEF COMPLAINT: f/u of metastatic colon cancer  CURRENT THERAPY:  FOLFIRI and Vectibix   Oncology History   metastatic colon 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 Liver biopsy 01/08/20 confirmed metastasis from primary colorectal cancer. PET scan showed hypermetabolism to known liver mets, diffuse thoracic and abdominal lymphadenopathy, a 1.8 cm LUL pulmonary nodule, and splenic flexure of colon. -treated with first line FOLFOX 01/25/20 - 10/02/20, Vectibix  added with C2. Oxali discontinued after C18 due to reaction. -switched to Xeloda  10/21/20, dose adjusted due to significant skin toxicity -due to cancer progression, treatment has been changed to FOLFIRI and bevacizumab  on 03/25/22 -will repeat CT scan after next cycle chemo  -Restaging CT scan from 06/16/2022 showed stable to mild decrease in size of treated liver metastasis. No new lesions -Her restaging CT scan on August 31, 2022 showed stable liver metastasis, and a stable 7 mm left lung nodule, indeterminate.  -restaging CT in 06/2023 showed mild disease progression in liver, I changed Beva to vectibix   - Restaging CT scan on September 27, 2023 showed stable disease -unfortunately restaging CT 01/21/2024 showed cancer progression in liver, her CEA has also significantly increased lately  -plan to change tx to Lonsurf  and Beva    Assessment & Plan Metastatic colon cancer with liver disease progression Metastatic colon cancer with progression in the liver.  CA19-9 tumor marker increased from 30-40 three months ago to 108 three weeks ago. CT scan shows liver lesion increased from 6 cm to 8.4 cm. Current chemotherapy regimen is insufficient to control disease progression. - Will initiate new chemotherapy regimen with oral chemotherapy Lonsurf  five days a week, every other week, and bevacizumab  infusion.  Benefit and potential side effect reviewed with patient, she agrees to proceed - Will discuss potential future options including regorafenib or cabozantinib+IO if needed. - Will coordinate with insurance for preauthorization of oral and intravenous medications. - Will start new treatment regimen in the first week of December.  Chemotherapy-induced peripheral neuropathy Peripheral neuropathy likely secondary to previous oxaliplatin  treatment, presenting with numbness and tingling. Oxaliplatin  was discontinued due to adverse reactions including sickness and chest pain. - Avoid reinitiating oxaliplatin  unless absolutely necessary and will consider lower dose with premedications if needed.  Constipation Managed with magnesium  citrate, which is effective. Miralax  is ineffective. - Continue magnesium  citrate for constipation management. - Monitor for symptoms of constipation and bloating.  Plan - I personally reviewed her restaging CT images and discussed the findings with patient, unfortunately she has disease progression in liver. - Will cancel her infusion today, and change treatment to Lonsurf  and bevacizumab , plan to start Lonsurf  on November 1 and beva on 12/3 - Follow-up on November 3 for bevacizumab  infusion - Will obtain Guardant360 on next visit   SUMMARY OF ONCOLOGIC HISTORY: Oncology History  metastatic colon cancer to liver  06/20/2019 Procedure   Colonoscopy by Dr Wilhelmenia 06/20/19  IMPRESSION -Seven 3 to 10 mm polyps in the sigmoid colon, in the transverse colon and in the escending colon, removed with a cold snare. Resected and  retrireved.  -One 20mm polyp in the descending colon. Biopsies. Tattoes.  -Mediaum sized lipoma in the ascending colon.   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 See Comment B. Colon, Ascending, polyp, Polypectomy:  -TUBULAR ADENOMA -No high grade dysplasia or malignancy.  C. Colon, TRansverse, Polyo, polectomy:  -TUBULAR ADENOMA -No high grade dysplasia or malignancy.  D. Colon, Sigmoid, Polyp, Polypectomy:  -HYPERPLASTIC POLYP   11/07/2019 Imaging   US  Abdomen 11/07/19  IMPRESSION: 1. Two solid masses in the liver are nonspecific. Recommend MRI abdomen with and without contrast for further evaluation.   12/15/2019 Imaging   MRI Abdomen 12/15/19  IMPRESSION: 1. There are two large masses in the liver with appearance favoring metastatic disease or hepatocellular carcinoma or cholangiocarcinoma. A benign etiology is highly unlikely given the enhancement pattern and associated adenopathy. 2. Considerable porta hepatis and retroperitoneal adenopathy. Some of the confluent porta hepatis tumor is potentially infiltrative and abuts the pancreatic body along its right upper margin, making it difficult to completely exclude the possibility of pancreatic adenocarcinoma primary. Possibilities helpful in further workup might include tissue diagnosis, endoscopic ultrasound, or nuclear medicine PET-CT. 3. Pancreas divisum. 4. Lumbar spondylosis and degenerative disc disease. 5. Despite efforts by the technologist and patient, motion artifact is present on today's exam and could not be eliminated. This reduces exam sensitivity and specificity.   12/22/2019 Procedure   Upper Endoscopy by Dr Rollin 12/22/19  IMPRESSION - One lymph node was visualized and measured in the porta hepatis region. Fine needle aspiration performed    12/22/2019 Initial Biopsy   A. LIVER, PORTA HEPATIS MASS, FINE NEEDLE  12/22/19 ASPIRATION:  FINAL MICROSCOPIC DIAGNOSIS:  - Malignant cells consistent with metastatic adenocarcinoma   01/01/2020 Initial Diagnosis   Intrahepatic cholangiocarcinoma (HCC)   01/08/2020 Initial Biopsy   FINAL MICROSCOPIC DIAGNOSIS:   A. LIVER, LEFT LOBE, BIOPSY:  - Metastatic adenocarcinoma, consistent with a colorectal primary.  See  comment      COMMENT:   Immunohistochemical stains show the tumor cells are positive for CK20  and CDX2 but negative for CK7, consistent with above interpretation.  Dr. 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   01/24/2020 Procedure   PAC placed 01/24/20   01/25/2020 -  Chemotherapy   first line FOLFOX starting 01/25/2020, Vextibix  added with C2 (02/06/20)   02/06/2020 - 02/06/2022 Chemotherapy   Patient is on Treatment Plan : COLORECTAL Panitumumab  q14d ([name] Wild - Type Gene Only)     06/20/2020 Imaging   CT CAP  IMPRESSION: 1. Continued interval reduction in size and conspicuity of a subsolid nodule of the peripheral left upper lobe. 2. Unchanged prominent pretracheal and subcarinal lymph nodes. 3. Redemonstrated partially calcified low-attenuation liver masses, slightly decreased in size compared to prior examination. 4. Slight interval decrease in size of a portacaval lymph node or conglomerate and retroperitoneal lymph nodes. 5. Findings are consistent with continued treatment response of nodal, pulmonary, and hepatic metastatic disease. 6. Coronary artery disease.   Aortic Atherosclerosis (ICD10-I70.0).     10/09/2020 Imaging   IMPRESSION: 1. Slight decrease in size of dominant liver mass with smaller liver mass within 1-2 mm of prior measurement. 2. Stable appearance of celiac lymph and retroperitoneal lymph nodes. Dominant node with calcification in the gastrohepatic ligament as  described. 3. Continued decrease in  size of LEFT upper lobe nodule. 4. Three-vessel coronary artery calcification. 5. Aortic atherosclerosis.   03/25/2022 - 07/16/2023 Chemotherapy   Patient is on Treatment Plan : COLORECTAL FOLFIRI + Bevacizumab  q14d     08/31/2022 Imaging    IMPRESSION: 1. Unchanged, densely calcified liver lesions, consistent with treated metastatic disease. 2. Unchanged, irregular subpleural opacity of the peripheral left upper lobe. 3. Unchanged subcentimeter gastrohepatic ligament lymph node. 4. No evidence of new metastatic disease in the chest, abdomen, or pelvis. 5. Status post hysterectomy. 6. Coronary artery disease. 7. Aortic valve calcifications. Correlate for echocardiographic evidence of aortic valve dysfunction.     06/22/2023 Imaging   CT Chest, Abdomen, and pelvis with contrast  IMPRESSION: Centrally calcified liver lesions are again seen and slightly increased today. Prominent nodes in the upper retroperitoneum are overall similar. Attention on follow-up.   Stable branching left upper lobe lung nodule.   08/11/2023 - 01/11/2024 Chemotherapy   Patient is on Treatment Plan : COLORECTAL FOLFIRI + Panitumumab  q14d        Discussed the use of AI scribe software for clinical note transcription with the patient, who gave verbal consent to proceed.  History of Present Illness Lindsay [name] is a 68 year old female with metastatic colon cancer who presents for follow-up.  Her tumor marker levels have increased over the past three months, with CA19-9 rising from 30-40 to 108. A recent CT scan showed an increase in the size of a liver lesion from 6 cm to 8.4 cm. She experienced stomach pain earlier today, attributed to taking a nausea pill on an empty stomach, which has since resolved. Constipation is present but managed effectively with magnesium . Appetite remains good, with adequate oral intake. She recalls a past adverse reaction to oxaliplatin , causing  sickness and near syncope during IV administration, leading to its discontinuation. Residual numbness persists from the treatment. She plans to travel to [place], [place] , for Thanksgiving and will return the first week of December.     All other systems were reviewed with the patient and are negative.  MEDICAL HISTORY:  Past Medical History:  Diagnosis Date   Arthritis    Diabetes mellitus without complication (HCC)    Hypertension    met colon ca to liver 01/2020    SURGICAL HISTORY: Past Surgical History:  Procedure Laterality Date   ABDOMINAL HYSTERECTOMY     ANTERIOR AND POSTERIOR REPAIR WITH SACROSPINOUS FIXATION N/A 07/16/2016   Procedure: ANTERIOR AND POSTERIOR REPAIR WITH SACROSPINOUS FIXATION;  Surgeon: Curlene Agent, MD;  Location: [facility];  Service: Gynecology;  Laterality: N/A;   CYSTOSCOPY  07/16/2016   Procedure: CYSTOSCOPY;  Surgeon: Curlene Agent, MD;  Location: [facility];  Service: Gynecology;;   ESOPHAGOGASTRODUODENOSCOPY (EGD) WITH PROPOFOL  N/A 12/22/2019   Procedure: ESOPHAGOGASTRODUODENOSCOPY (EGD) WITH PROPOFOL ;  Surgeon: [name], MD;  Location: [facility];  Service: Endoscopy;  Laterality: N/A;   FINE NEEDLE ASPIRATION N/A 12/22/2019   Procedure: FINE NEEDLE ASPIRATION (FNA) LINEAR;  Surgeon: [name], MD;  Location: [facility];  Service: Endoscopy;  Laterality: N/A;   IR IMAGING GUIDED PORT INSERTION  01/24/2020   IR IMAGING GUIDED PORT INSERTION  01/14/2024   IR REMOVAL TUN ACCESS W/ PORT W/O FL MOD SED  07/30/2023   LAPAROSCOPIC VAGINAL HYSTERECTOMY WITH SALPINGECTOMY Bilateral 07/16/2016   Procedure: LAPAROSCOPIC ASSISTED VAGINAL HYSTERECTOMY WITH SALPINGECTOMY;  Surgeon: Curlene Agent, MD;  Location: [facility];  Service: Gynecology;  Laterality: Bilateral;   LEFT HEART CATH AND CORONARY ANGIOGRAPHY N/A 05/07/2023  Procedure: LEFT HEART CATH AND CORONARY ANGIOGRAPHY;  Surgeon: Anner Alm ORN, MD;  Location: [facility] INVASIVE CV LAB;  Service:  Cardiovascular;  Laterality: N/A;   MYOMECTOMY ABDOMINAL APPROACH     THYROID SURGERY     tyroid     UPPER ESOPHAGEAL ENDOSCOPIC ULTRASOUND (EUS) N/A 12/22/2019   Procedure: UPPER ESOPHAGEAL ENDOSCOPIC ULTRASOUND (EUS);  Surgeon: [name], MD;  Location: [facility];  Service: Endoscopy;  Laterality: N/A;    I have reviewed the social history and family history with the patient and they are unchanged from previous note.  ALLERGIES:  is allergic to oxaliplatin .  MEDICATIONS:  Current Outpatient Medications  Medication Sig Dispense Refill   aspirin  EC 81 MG tablet Take 1 tablet (81 mg total) by mouth daily. Swallow whole. 90 tablet 0   atorvastatin  (LIPITOR) 80 MG tablet Take 1 tablet (80 mg total) by mouth daily. 90 tablet 0   clindamycin  (CLEOCIN -T) 1 % lotion Apply topically as needed. 60 mL 0   clopidogrel  (PLAVIX ) 75 MG tablet Take 1 tablet (75 mg total) by mouth daily. 90 tablet 0   doxycycline  (VIBRA -TABS) 100 MG tablet Take 1 tablet (100 mg total) by mouth 2 (two) times daily. 60 tablet 3   ezetimibe  (ZETIA ) 10 MG tablet Take 1 tablet (10 mg total) by mouth daily. 90 tablet 3   isosorbide  mononitrate (IMDUR ) 30 MG 24 hr tablet Take 1 tablet (30 mg total) by mouth every evening. 90 tablet 3   magnesium  oxide (MAG-OX) 400 (240 Mg) MG tablet Take 1 tablet (400 mg total) by mouth 2 (two) times daily. 60 tablet 1   metoprolol  succinate (TOPROL  XL) 25 MG 24 hr tablet Take 1 tablet (25 mg total) by mouth daily. Hold if systolic BP (top number) is less than 100 and/or heart rate is less than 55 90 tablet 3   nitroGLYCERIN  (NITROSTAT ) 0.4 MG SL tablet Place 1 tablet (0.4 mg total) under the tongue every 5 (five) minutes as needed for chest pain. 75 tablet 3   POTASSIUM CHLORIDE  PO Take by mouth every other day.     prochlorperazine  (COMPAZINE ) 10 MG tablet Take 1 tablet (10 mg total) by mouth every 6 (six) hours as needed for nausea or vomiting. 90 tablet 0   ranolazine  (RANEXA ) 500 MG  12 hr tablet Take 1 tablet (500 mg total) by mouth 2 (two) times daily. 180 tablet 3   No current facility-administered medications for this visit.    PHYSICAL EXAMINATION: ECOG PERFORMANCE STATUS: 1 - Symptomatic but completely ambulatory  Vitals:   01/26/24 0913 01/26/24 0914  BP: (!) 153/100   Pulse: 77 63  Resp: 17   Temp: (!) 97.5 F (36.4 C)   SpO2: 97% 100%   Wt Readings from Last 3 Encounters:  01/26/24 173 lb 6.4 oz (78.7 kg)  01/14/24 173 lb 15.1 oz (78.9 kg)  01/11/24 176 lb 14.4 oz (80.2 kg)     GENERAL:alert, no distress and comfortable SKIN: skin color, texture, turgor are normal, no rashes or significant lesions EYES: normal, Conjunctiva are pink and non-injected, sclera clear NECK: supple, thyroid normal size, non-tender, without nodularity LYMPH:  no palpable lymphadenopathy in the cervical, axillary  LUNGS: clear to auscultation and percussion with normal breathing effort HEART: regular rate & rhythm and no murmurs and no lower extremity edema ABDOMEN:abdomen soft, non-tender and normal bowel sounds Musculoskeletal:no cyanosis of digits and no clubbing  NEURO: alert & oriented x 3 with fluent speech, no focal motor/sensory deficits  Physical Exam    LABORATORY DATA:  I have reviewed the data as listed    Latest Ref Rng & Units 01/26/2024    8:53 AM 01/11/2024   10:16 AM 01/05/2024    2:54 PM  CBC  WBC 4.0 - 10.5 K/uL 5.7  5.9  5.4   Hemoglobin 12.0 - 15.0 g/dL 88.5  88.9  88.5   Hematocrit 36.0 - 46.0 % 35.1  33.7  34.3   Platelets 150 - 400 K/uL 295  270  277         Latest Ref Rng & Units 01/26/2024    8:53 AM 01/11/2024   10:16 AM 01/05/2024    2:54 PM  CMP  Glucose 70 - 99 mg/dL 857  879  824   BUN 8 - 23 mg/dL 10  8  9    Creatinine 0.44 - 1.00 mg/dL 9.36  9.41  9.39   Sodium 135 - 145 mmol/L 138  140  137   Potassium 3.5 - 5.1 mmol/L 3.5  3.6  3.4   Chloride 98 - 111 mmol/L 99  105  102   CO2 22 - 32 mmol/L 27  30  29    Calcium   8.9 - 10.3 mg/dL 9.8  9.3  9.3   Total Protein 6.5 - 8.1 g/dL 7.7  7.1  7.5   Total Bilirubin 0.0 - 1.2 mg/dL 0.4  0.3  0.4   Alkaline Phos 38 - 126 U/L 219  178  164   AST 15 - 41 U/L 24  22  19    ALT 0 - 44 U/L 19  27  26        RADIOGRAPHIC STUDIES: I have personally reviewed the radiological images as listed and agreed with the findings in the report. No results found.    No orders of the defined types were placed in this encounter.  All questions were answered. The patient knows to call the clinic with any problems, questions or concerns. No barriers to learning was detected. The total time spent in the appointment was 40 minutes, including review of chart and various tests results, discussions about plan of care and coordination of care plan     Onita Mattock, MD 01/26/2024

## 2024-01-26 NOTE — Telephone Encounter (Signed)
 Oral Oncology Patient Advocate Encounter  Prior Authorization for [place] has been approved.    PA# [id number] Effective dates: 12/27/2023 through 01/25/2025  Patients co-pay is $1223.82 I will follow up to see if copay assistance is needed     Charlott Hamilton,  CPhT-Adv  she/her/hers [place]  [facility] Pharmacy Technician Patient Advocate Specialist III [facility] Phone: [phone]  Fax: [phone] [email or web address]

## 2024-01-26 NOTE — Telephone Encounter (Signed)
 Oral Oncology Pharmacist Encounter  Received new prescription for Lonsurf (trifluridine-tipiracil) for the treatment of metastatic colon cancer in conjunction with bevacizumab , planned duration until disease progression or unacceptable drug toxicity.  CBC w/ Diff and CMP from 01/26/24 assessed, no relevant lab abnormalities requiring baseline dose adjustment required at this time. Prescription dose and frequency assessed for appropriateness.  Current medication list in Epic reviewed, no relevant/significant DDIs with Lonsurf identified.  Evaluated chart and no patient barriers to medication adherence noted.   Patient agreement for treatment documented in MD note on 01/26/24.  Due to high copay, Oral Oncology Clinic will proceed with applying for patient assistance for patient at this time.   Oral Oncology Clinic will continue to follow for insurance authorization, copayment issues, initial counseling and start date.  Asberry Macintosh, PharmD, BCPS, BCOP Hematology/Oncology Clinical Pharmacist [phone] 01/26/2024 11:02 AM

## 2024-01-26 NOTE — Telephone Encounter (Signed)
 Oral Oncology Patient Advocate Encounter   Received notification that prior authorization for Lonsurf is required.   PA submitted on 01/25/24 Key [id number] Status is pending      Charlott Hamilton,  CPhT-Adv  she/her/hers [facility]  [facility] Pharmacy Technician Patient Advocate Specialist III [facility] Phone: [phone]  Fax: [phone] [email or web address]

## 2024-01-27 ENCOUNTER — Encounter: Payer: Self-pay | Admitting: Hematology

## 2024-01-27 NOTE — Addendum Note (Signed)
 Addended by: [name] on: 01/27/2024 09:35 AM   Modules accepted: Orders

## 2024-01-28 ENCOUNTER — Telehealth: Payer: Self-pay

## 2024-01-28 ENCOUNTER — Other Ambulatory Visit (HOSPITAL_COMMUNITY): Payer: Self-pay

## 2024-01-28 ENCOUNTER — Inpatient Hospital Stay: Payer: Medicare (Managed Care)

## 2024-01-28 NOTE — Telephone Encounter (Signed)
 Oral Oncology Patient Advocate Encounter  Patient has requested medication assistance for Lonsurf . There is not any grant funding available at this time for Colon Cancer. I have Sent a PAP Application through [facility] [facility] for the patient to fill out via Docusign.     Charlott Hamilton,  CPhT-Adv  she/her/hers [facility]  [facility] Pharmacy Technician Patient Advocate Specialist III [facility] Phone: [phone]  Fax: [phone] [email or web address]

## 2024-01-31 NOTE — Progress Notes (Signed)
 Pharmacist Chemotherapy Monitoring - Initial Assessment    Anticipated start date: 02/09/24   The following has been reviewed per standard work regarding the patient's treatment regimen: The patient's diagnosis, treatment plan and drug doses, and organ/hematologic function Lab orders and baseline tests specific to treatment regimen  The treatment plan start date, drug sequencing, and pre-medications Prior authorization status  Patient's documented medication list, including drug-drug interaction screen and prescriptions for anti-emetics and supportive care specific to the treatment regimen The drug concentrations, fluid compatibility, administration routes, and timing of the medications to be used The patient's access for treatment and lifetime cumulative dose history, if applicable  The patient's medication allergies and previous infusion related reactions, if applicable   Changes made to treatment plan:  N/A  Follow up needed:  N/A   Henrique Parekh, PharmD, MBA

## 2024-02-01 NOTE — Telephone Encounter (Signed)
 Oral Oncology Patient Advocate Encounter  Patient came in person to sign [employer] PAP application for Lonsurf ,   Status: Pending Dr. Demetra  signature    Charlott Hamilton,  CPhT-Adv  she/her/hers [facility]  [facility] Pharmacy Technician Patient Advocate Specialist III [facility] Phone: [phone]  Fax: [phone] [email or web address]

## 2024-02-02 ENCOUNTER — Other Ambulatory Visit: Payer: Self-pay

## 2024-02-02 DIAGNOSIS — C221 Intrahepatic bile duct carcinoma: Secondary | ICD-10-CM

## 2024-02-02 DIAGNOSIS — C189 Malignant neoplasm of colon, unspecified: Secondary | ICD-10-CM

## 2024-02-02 NOTE — Progress Notes (Signed)
 Verbal order w/readback order from Dr. Lanny for Guardant 360 to be drawn on 12/22/2023. Order placed in EPIC and in Guardant portal.  Guardant 360 kit and requisition given to [facility] Lab Receptionist.

## 2024-02-07 ENCOUNTER — Other Ambulatory Visit (HOSPITAL_COMMUNITY): Payer: Self-pay

## 2024-02-07 NOTE — Telephone Encounter (Signed)
 Oral Oncology Patient Advocate Encounter   PAP application for Lonsurf   to [facility] [facility] Patient Support has been faxed in  with both patient and Dr. Demetra signatures.   Status: Pending Approval  I will continue to follow up      Charlott Hamilton,  CPhT-Adv  she/her/hers [facility]  [facility] Services Pharmacy Technician Patient Advocate Specialist III [facility] Phone: [phone]  Fax: [phone] [email or web address]

## 2024-02-08 NOTE — Assessment & Plan Note (Signed)
 MSS, [name]/[name] wildtype -diagnosed 12/2019 by porta hepatis LN biopsy during EUS for work up of abdominal pain and liver lesions seen on US  and MRI. Liver biopsy 01/08/20 confirmed metastasis from primary colorectal cancer. PET scan showed hypermetabolism to known liver mets, diffuse thoracic and abdominal lymphadenopathy, a 1.8 cm LUL pulmonary nodule, and splenic flexure of colon. -treated with first line FOLFOX 01/25/20 - 10/02/20, Vectibix  added with C2. Oxali discontinued after C18 due to reaction. -switched to Xeloda  10/21/20, dose adjusted due to significant skin toxicity -due to cancer progression, treatment has been changed to FOLFIRI and bevacizumab  on 03/25/22 -will repeat CT scan after next cycle chemo  -Restaging CT scan from 06/16/2022 showed stable to mild decrease in size of treated liver metastasis. No new lesions -Her restaging CT scan on August 31, 2022 showed stable liver metastasis, and a stable 7 mm left lung nodule, indeterminate.  -restaging CT in 06/2023 showed mild disease progression in liver, I changed Beva to vectibix   - Restaging CT scan on September 27, 2023 showed stable disease -unfortunately restaging CT 01/21/2024 showed cancer progression in liver, her CEA has also significantly increased lately  -plan to change tx to Lonsurf  and Beva, she started on 02/07/2024

## 2024-02-08 NOTE — Telephone Encounter (Signed)
 Oral Oncology Patient Advocate Encounter   Received notification that the application for assistance for Lonsurf  through [facility] Oncology Patient Assistance program has been approved.   [facility]  phone number [phone].   Effective dates: 02/08/2024 through 03/08/2025  Medication will be filled at [facility].  I have spoken to the patient via phone she expects delivery of medication Friday Dec 5th 2025.   Charlott Hamilton,  CPhT-Adv  she/her/hers [facility]  [facility] Services Pharmacy Technician Patient Advocate Specialist III [facility] Phone: [phone]  Fax: [phone] [email or web address]

## 2024-02-08 NOTE — Assessment & Plan Note (Signed)
-  overall mild and stable

## 2024-02-09 ENCOUNTER — Other Ambulatory Visit (HOSPITAL_COMMUNITY): Payer: Self-pay

## 2024-02-09 ENCOUNTER — Inpatient Hospital Stay: Payer: Medicare (Managed Care)

## 2024-02-09 ENCOUNTER — Encounter: Payer: Self-pay | Admitting: Hematology

## 2024-02-09 ENCOUNTER — Inpatient Hospital Stay: Payer: Medicare (Managed Care) | Attending: Hematology | Admitting: Hematology

## 2024-02-09 ENCOUNTER — Other Ambulatory Visit: Payer: Self-pay

## 2024-02-09 VITALS — BP 116/83 | HR 71 | Temp 98.0°F | Resp 18

## 2024-02-09 VITALS — BP 128/79 | HR 62 | Temp 98.1°F | Resp 16 | Ht 66.0 in | Wt 175.0 lb

## 2024-02-09 DIAGNOSIS — G620 Drug-induced polyneuropathy: Secondary | ICD-10-CM | POA: Insufficient documentation

## 2024-02-09 DIAGNOSIS — Z90710 Acquired absence of both cervix and uterus: Secondary | ICD-10-CM | POA: Diagnosis not present

## 2024-02-09 DIAGNOSIS — T451X5A Adverse effect of antineoplastic and immunosuppressive drugs, initial encounter: Secondary | ICD-10-CM

## 2024-02-09 DIAGNOSIS — C221 Intrahepatic bile duct carcinoma: Secondary | ICD-10-CM

## 2024-02-09 DIAGNOSIS — Z5112 Encounter for antineoplastic immunotherapy: Secondary | ICD-10-CM | POA: Diagnosis present

## 2024-02-09 DIAGNOSIS — Z79899 Other long term (current) drug therapy: Secondary | ICD-10-CM | POA: Diagnosis not present

## 2024-02-09 DIAGNOSIS — C787 Secondary malignant neoplasm of liver and intrahepatic bile duct: Secondary | ICD-10-CM | POA: Diagnosis present

## 2024-02-09 DIAGNOSIS — C7800 Secondary malignant neoplasm of unspecified lung: Secondary | ICD-10-CM | POA: Insufficient documentation

## 2024-02-09 DIAGNOSIS — C19 Malignant neoplasm of rectosigmoid junction: Secondary | ICD-10-CM | POA: Insufficient documentation

## 2024-02-09 DIAGNOSIS — C189 Malignant neoplasm of colon, unspecified: Secondary | ICD-10-CM

## 2024-02-09 DIAGNOSIS — T451X5D Adverse effect of antineoplastic and immunosuppressive drugs, subsequent encounter: Secondary | ICD-10-CM | POA: Insufficient documentation

## 2024-02-09 LAB — CBC WITH DIFFERENTIAL (CANCER CENTER ONLY)
Abs Immature Granulocytes: 0.01 K/uL (ref 0.00–0.07)
Basophils Absolute: 0.1 K/uL (ref 0.0–0.1)
Basophils Relative: 1 %
Eosinophils Absolute: 0.2 K/uL (ref 0.0–0.5)
Eosinophils Relative: 3 %
HCT: 32.5 % — ABNORMAL LOW (ref 36.0–46.0)
Hemoglobin: 10.5 g/dL — ABNORMAL LOW (ref 12.0–15.0)
Immature Granulocytes: 0 %
Lymphocytes Relative: 21 %
Lymphs Abs: 1.2 K/uL (ref 0.7–4.0)
MCH: 28.2 pg (ref 26.0–34.0)
MCHC: 32.3 g/dL (ref 30.0–36.0)
MCV: 87.4 fL (ref 80.0–100.0)
Monocytes Absolute: 0.5 K/uL (ref 0.1–1.0)
Monocytes Relative: 10 %
Neutro Abs: 3.6 K/uL (ref 1.7–7.7)
Neutrophils Relative %: 65 %
Platelet Count: 323 K/uL (ref 150–400)
RBC: 3.72 MIL/uL — ABNORMAL LOW (ref 3.87–5.11)
RDW: 16.7 % — ABNORMAL HIGH (ref 11.5–15.5)
WBC Count: 5.6 K/uL (ref 4.0–10.5)
nRBC: 0 % (ref 0.0–0.2)

## 2024-02-09 LAB — CMP (CANCER CENTER ONLY)
ALT: 36 U/L (ref 0–44)
AST: 29 U/L (ref 15–41)
Albumin: 4.1 g/dL (ref 3.5–5.0)
Alkaline Phosphatase: 247 U/L — ABNORMAL HIGH (ref 38–126)
Anion gap: 10 (ref 5–15)
BUN: 6 mg/dL — ABNORMAL LOW (ref 8–23)
CO2: 28 mmol/L (ref 22–32)
Calcium: 9.6 mg/dL (ref 8.9–10.3)
Chloride: 101 mmol/L (ref 98–111)
Creatinine: 0.58 mg/dL (ref 0.44–1.00)
GFR, Estimated: >60 mL/min (ref >60)
Glucose, Bld: 132 mg/dL — ABNORMAL HIGH (ref 70–99)
Potassium: 3.9 mmol/L (ref 3.5–5.1)
Sodium: 139 mmol/L (ref 135–145)
Total Bilirubin: 0.4 mg/dL (ref 0.0–1.2)
Total Protein: 7.7 g/dL (ref 6.5–8.1)

## 2024-02-09 LAB — TOTAL PROTEIN, URINE DIPSTICK: Protein, ur: NEGATIVE mg/dL

## 2024-02-09 LAB — CEA (ACCESS): CEA (CHCC): 116.11 ng/mL — ABNORMAL HIGH (ref 0.00–5.00)

## 2024-02-09 MED ORDER — SODIUM CHLORIDE 0.9 % IV SOLN
5.0000 mg/kg | Freq: Once | INTRAVENOUS | Status: AC
  Administered 2024-02-09: 400 mg via INTRAVENOUS
  Filled 2024-02-09: qty 16

## 2024-02-09 MED ORDER — SODIUM CHLORIDE 0.9 % IV SOLN: INTRAVENOUS | Status: DC

## 2024-02-09 NOTE — Telephone Encounter (Signed)
 Oral Oncology Pharmacist Encounter  Medication Calendar for Cycles 1 and 2 of Lonsurf  emailed to patient at [email or web address].  Lonsurf  is due to arrive to patient on 02/11/24 from the patient assistance program. MD OK with patient starting Lonsurf  on 12/5 and utilizing this as Day 1 of the first cycle.   Due to the holidays, for cycle 1 patient will take Lonsurf  on days 1-5 and 8-12 of the cycle (12/5-12/9 and 12/12-12/16). Per staff message from Dr. Lanny, she would like patient to start Cycle 2 of Lonsurf  on 03/06/24. Starting with Cycle 2, patient will follow the directions of taking Lonsurf  on days 1-5 and 15-19 of each 28 day cycle. This has all reflected on calendar shared with patient.   I will call patient on 02/10/24 for medication education.   Asberry Macintosh, PharmD, BCPS, BCOP Hematology/Oncology Clinical Pharmacist [phone] 02/09/2024 12:53 PM

## 2024-02-09 NOTE — Progress Notes (Signed)
 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 Liver biopsy 01/08/20 confirmed metastasis from primary colorectal cancer. PET scan showed hypermetabolism to known liver mets, diffuse thoracic and abdominal lymphadenopathy, a 1.8 cm LUL pulmonary nodule, and splenic flexure of colon. -treated with first line FOLFOX 01/25/20 - 10/02/20, Vectibix  added with C2. Oxali discontinued after C18 due to reaction. -switched to Xeloda  10/21/20, dose adjusted due to significant skin toxicity -due to cancer progression, treatment has been changed to FOLFIRI and bevacizumab  on 03/25/22 -will repeat CT scan after next cycle chemo  -Restaging CT scan from 06/16/2022 showed stable to mild decrease in size of treated liver metastasis. No new lesions -Her restaging CT scan on August 31, 2022 showed stable liver metastasis, and a stable 7 mm left lung nodule, indeterminate.  -restaging CT in 06/2023 showed mild disease progression in liver, I changed Beva to vectibix   - Restaging CT scan on September 27, 2023 showed stable disease -unfortunately restaging CT 01/21/2024 showed cancer progression in liver, her CEA has also significantly increased lately  -plan to change tx to Lonsurf  and Beva, she started on 02/07/2024  Assessment & Plan Metastatic  colon cancer involving the liver Currently undergoing chemotherapy with oral medication and infusion therapy. Awaiting results from Guardian 360 test for potential new mutations in the tumor. - Will start oral medication Lonsurf  on Friday night upon receipt.  Due to the upcoming Christmas, she will take Lonsurf  on day 1-5, and 8-12 for this first cycle  - Will proceed with beva infusion today. - Scheduled next infusion for December 17th - Provided a calendar for medication schedule. - Maintain appointment on December 17th.  Chemotherapy-induced anemia Mild anemia likely secondary to chemotherapy. Hemoglobin level is 10.5, indicating very mild anemia.  Plan - Lab reviewed, adequate for treatment, she will proceed bevacizumab  today and continue every 2 weeks - She will receive Lonsurf  on December 5, and start on same day.  For this first cycle, she will take Lonsurf  on day 1-5, 8-12 - She will return in 2 weeks for bevacizumab   - Will change Lonsurf  on day 1-5 and 15-19 every 28 days from cycle 2    SUMMARY OF ONCOLOGIC HISTORY: Oncology History  metastatic colon cancer to liver  06/20/2019 Procedure   Colonoscopy by Dr Wilhelmenia 06/20/19  IMPRESSION -Seven 3 to 10 mm polyps in the sigmoid colon, in the transverse colon and in the escending colon, removed with a cold snare. Resected and retrireved.  -One 20mm polyp in the descending colon. Biopsies. Tattoes.  -Mediaum sized lipoma in the ascending colon.   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 See Comment B. Colon, Ascending, polyp, Polypectomy:  -TUBULAR ADENOMA -No high grade dysplasia or malignancy.  C. Colon, TRansverse, Polyo, polectomy:  -TUBULAR ADENOMA -No high grade dysplasia  or malignancy.  D. Colon, Sigmoid, Polyp, Polypectomy:  -HYPERPLASTIC POLYP   11/07/2019 Imaging   US  Abdomen 11/07/19  IMPRESSION: 1. Two solid masses in the  liver are nonspecific. Recommend MRI abdomen with and without contrast for further evaluation.   12/15/2019 Imaging   MRI Abdomen 12/15/19  IMPRESSION: 1. There are two large masses in the liver with appearance favoring metastatic disease or hepatocellular carcinoma or cholangiocarcinoma. A benign etiology is highly unlikely given the enhancement pattern and associated adenopathy. 2. Considerable porta hepatis and retroperitoneal adenopathy. Some of the confluent porta hepatis tumor is potentially infiltrative and abuts the pancreatic body along its right upper margin, making it difficult to completely exclude the possibility of pancreatic adenocarcinoma primary. Possibilities helpful in further workup might include tissue diagnosis, endoscopic ultrasound, or nuclear medicine PET-CT. 3. Pancreas divisum. 4. Lumbar spondylosis and degenerative disc disease. 5. Despite efforts by the technologist and patient, motion artifact is present on today's exam and could not be eliminated. This reduces exam sensitivity and specificity.   12/22/2019 Procedure   Upper Endoscopy by Dr Rollin 12/22/19  IMPRESSION - One lymph node was visualized and measured in the porta hepatis region. Fine needle aspiration performed    12/22/2019 Initial Biopsy   A. LIVER, PORTA HEPATIS MASS, FINE NEEDLE 12/22/19 ASPIRATION:  FINAL MICROSCOPIC DIAGNOSIS:  - Malignant cells consistent with metastatic adenocarcinoma   01/01/2020 Initial Diagnosis   Intrahepatic cholangiocarcinoma (HCC)   01/08/2020 Initial Biopsy   FINAL MICROSCOPIC DIAGNOSIS:   A. LIVER, LEFT LOBE, BIOPSY:  - Metastatic adenocarcinoma, consistent with a colorectal primary.  See  comment      COMMENT:   Immunohistochemical stains show the tumor cells are positive for CK20  and CDX2 but negative for CK7, consistent with above interpretation.  Dr. Lanny was notified on 01/10/2020   01/08/2020 Genetic Testing   [name]  [name]  wildtype and [name]/[name] mutations which make her eligible for target biological agent Vectibix .   01/24/2020 Procedure   PAC placed 01/24/20   01/25/2020 -  Chemotherapy   first line FOLFOX starting 01/25/2020, Vextibix  added with C2 (02/06/20)   02/06/2020 - 02/06/2022 Chemotherapy   Patient is on Treatment Plan : COLORECTAL Panitumumab  q14d ([name] Wild - Type Gene Only)     06/20/2020 Imaging   CT CAP  IMPRESSION: 1. Continued interval reduction in size and conspicuity of a subsolid nodule of the peripheral left upper lobe. 2. Unchanged prominent pretracheal and subcarinal lymph nodes. 3. Redemonstrated partially calcified low-attenuation liver masses, slightly decreased in size compared to prior examination. 4. Slight interval decrease in size of a portacaval lymph node or conglomerate and retroperitoneal lymph nodes. 5. Findings are consistent with continued treatment response of nodal, pulmonary, and hepatic metastatic disease. 6. Coronary artery disease.   Aortic Atherosclerosis (ICD10-I70.0).     10/09/2020 Imaging   IMPRESSION: 1. Slight decrease in size of dominant liver mass with smaller liver mass within 1-2 mm of prior measurement. 2. Stable appearance of celiac lymph and retroperitoneal lymph nodes. Dominant node with calcification in the gastrohepatic ligament as described. 3. Continued decrease in size of LEFT upper lobe nodule. 4. Three-vessel coronary artery calcification. 5. Aortic atherosclerosis.   03/25/2022 - 07/16/2023 Chemotherapy   Patient is on Treatment Plan : COLORECTAL FOLFIRI + Bevacizumab  q14d     08/31/2022 Imaging    IMPRESSION: 1. Unchanged, densely calcified liver lesions, consistent with treated metastatic disease. 2. Unchanged, irregular subpleural opacity of the peripheral left upper lobe. 3. Unchanged  subcentimeter gastrohepatic ligament lymph node. 4. No evidence of new metastatic disease in the chest, abdomen, or pelvis. 5. Status  post hysterectomy. 6. Coronary artery disease. 7. Aortic valve calcifications. Correlate for echocardiographic evidence of aortic valve dysfunction.     06/22/2023 Imaging   CT Chest, Abdomen, and pelvis with contrast  IMPRESSION: Centrally calcified liver lesions are again seen and slightly increased today. Prominent nodes in the upper retroperitoneum are overall similar. Attention on follow-up.   Stable branching left upper lobe lung nodule.   08/11/2023 - 01/11/2024 Chemotherapy   Patient is on Treatment Plan : COLORECTAL FOLFIRI + Panitumumab  q14d     02/09/2024 -  Chemotherapy   Patient is on Treatment Plan : COLORECTAL Bevacizumab  + Trifluridine/Tipiracil q28d        Discussed the use of AI scribe software for clinical note transcription with the patient, who gave verbal consent to proceed.  History of Present Illness Lindsay [name] is a 68 year old female with metastatic colon cancer who presents for follow-up.  She is awaiting delivery of her new oral chemotherapy, to be taken for five days followed by one week off, and plans to start as soon as it arrives.  She is scheduled for infusion today and tolerated the last cycle without issues. She has nausea medication at home and does not need refills.  Recent labs show hemoglobin 10.5 and a negative urine test. She is awaiting Guardant 360 results for assessment of new tumor mutations, expected in about a week.     All other systems were reviewed with the patient and are negative.  MEDICAL HISTORY:  Past Medical History:  Diagnosis Date   Arthritis    Diabetes mellitus without complication (HCC)    Hypertension    met colon ca to liver 01/2020    SURGICAL HISTORY: Past Surgical History:  Procedure Laterality Date   ABDOMINAL HYSTERECTOMY     ANTERIOR AND POSTERIOR REPAIR WITH SACROSPINOUS FIXATION N/A 07/16/2016   Procedure: ANTERIOR AND POSTERIOR REPAIR WITH SACROSPINOUS FIXATION;  Surgeon: Curlene Agent, MD;   Location: [facility];  Service: Gynecology;  Laterality: N/A;   CYSTOSCOPY  07/16/2016   Procedure: CYSTOSCOPY;  Surgeon: Curlene Agent, MD;  Location: [facility];  Service: Gynecology;;   ESOPHAGOGASTRODUODENOSCOPY (EGD) WITH PROPOFOL  N/A 12/22/2019   Procedure: ESOPHAGOGASTRODUODENOSCOPY (EGD) WITH PROPOFOL ;  Surgeon: [name], MD;  Location: [facility];  Service: Endoscopy;  Laterality: N/A;   FINE NEEDLE ASPIRATION N/A 12/22/2019   Procedure: FINE NEEDLE ASPIRATION (FNA) LINEAR;  Surgeon: [name], MD;  Location: [facility];  Service: Endoscopy;  Laterality: N/A;   IR IMAGING GUIDED PORT INSERTION  01/24/2020   IR IMAGING GUIDED PORT INSERTION  01/14/2024   IR REMOVAL TUN ACCESS W/ PORT W/O FL MOD SED  07/30/2023   LAPAROSCOPIC VAGINAL HYSTERECTOMY WITH SALPINGECTOMY Bilateral 07/16/2016   Procedure: LAPAROSCOPIC ASSISTED VAGINAL HYSTERECTOMY WITH SALPINGECTOMY;  Surgeon: Curlene Agent, MD;  Location: [facility];  Service: Gynecology;  Laterality: Bilateral;   LEFT HEART CATH AND CORONARY ANGIOGRAPHY N/A 05/07/2023   Procedure: LEFT HEART CATH AND CORONARY ANGIOGRAPHY;  Surgeon: Anner Alm ORN, MD;  Location: [facility] IN[facility]ASIVE CV [facility];  Service: Cardiovascular;  Laterality: N/A;   MYOMECTOMY ABDOMINAL APPROACH     THYROID SURGERY     tyroid     UPPER ESOPHAGEAL ENDOSCOPIC ULTRASOUND (EUS) N/A 12/22/2019   Procedure: UPPER ESOPHAGEAL ENDOSCOPIC ULTRASOUND (EUS);  Surgeon: [name], MD;  Location: [facility];  Service: Endoscopy;  Laterality: N/A;  I have reviewed the social history and family history with the patient and they are unchanged from previous note.  ALLERGIES:  is allergic to oxaliplatin .  MEDICATIONS:  Current Outpatient Medications  Medication Sig Dispense Refill   aspirin  EC 81 MG tablet Take 1 tablet (81 mg total) by mouth daily. Swallow whole. 90 tablet 0   atorvastatin  (LIPITOR) 80 MG tablet Take 1 tablet (80 mg total) by mouth daily. 90 tablet 0   clindamycin   (CLEOCIN -T) 1 % lotion Apply topically as needed. 60 mL 0   clopidogrel  (PLAVIX ) 75 MG tablet Take 1 tablet (75 mg total) by mouth daily. 90 tablet 0   doxycycline  (VIBRA -TABS) 100 MG tablet Take 1 tablet (100 mg total) by mouth 2 (two) times daily. 60 tablet 3   ezetimibe  (ZETIA ) 10 MG tablet Take 1 tablet (10 mg total) by mouth daily. 90 tablet 3   isosorbide  mononitrate (IMDUR ) 30 MG 24 hr tablet Take 1 tablet (30 mg total) by mouth every evening. 90 tablet 3   magnesium  oxide (MAG-OX) 400 (240 Mg) MG tablet Take 1 tablet (400 mg total) by mouth 2 (two) times daily. 60 tablet 1   metoprolol  succinate (TOPROL  XL) 25 MG 24 hr tablet Take 1 tablet (25 mg total) by mouth daily. Hold if systolic BP (top number) is less than 100 and/or heart rate is less than 55 90 tablet 3   nitroGLYCERIN  (NITROSTAT ) 0.4 MG SL tablet Place 1 tablet (0.4 mg total) under the tongue every 5 (five) minutes as needed for chest pain. 75 tablet 3   POTASSIUM CHLORIDE  PO Take by mouth every other day.     prochlorperazine  (COMPAZINE ) 10 MG tablet Take 1 tablet (10 mg total) by mouth every 6 (six) hours as needed for nausea or vomiting. 90 tablet 0   ranolazine  (RANEXA ) 500 MG 12 hr tablet Take 1 tablet (500 mg total) by mouth 2 (two) times daily. 180 tablet 3   trifluridine -tipiracil  (LONSURF ) 20-8.19 MG tablet Take 3 tablets (60 mg of trifluridine  total) by mouth 2 (two) times daily after a meal. Take within 1 hr after AM & PM meals on days 1-5, and 15-19. Repeat every 28 days. 60 tablet 1   No current facility-administered medications for this visit.    PHYSICAL EXAMINATION: ECOG PERFORMANCE STATUS: 1 - Symptomatic but completely ambulatory  Vitals:   02/09/24 1107 02/09/24 1109  BP: (!) 143/85 128/79  Pulse: 85 62  Resp: 16   Temp: 98.1 F (36.7 C)   SpO2: 99% 99%   Wt Readings from Last 3 Encounters:  02/09/24 175 lb (79.4 kg)  01/26/24 173 lb 6.4 oz (78.7 kg)  01/14/24 173 lb 15.1 oz (78.9 kg)      GENERAL:alert, no distress and comfortable SKIN: skin color, texture, turgor are normal, no rashes or significant lesions EYES: normal, Conjunctiva are pink and non-injected, sclera clear NECK: supple, thyroid normal size, non-tender, without nodularity LYMPH:  no palpable lymphadenopathy in the cervical, axillary  LUNGS: clear to auscultation and percussion with normal breathing effort HEART: regular rate & rhythm and no murmurs and no lower extremity edema ABDOMEN:abdomen soft, non-tender and normal bowel sounds Musculoskeletal:no cyanosis of digits and no clubbing  NEURO: alert & oriented x 3 with fluent speech, no focal motor/sensory deficits  Physical Exam    LABORATORY DATA:  I have reviewed the data as listed    Latest Ref Rng & Units 02/09/2024   10:26 AM 01/26/2024    8:53 AM 01/11/2024  10:16 AM  CBC  WBC 4.0 - 10.5 K/uL 5.6  5.7  5.9   Hemoglobin 12.0 - 15.0 g/dL 89.4  88.5  88.9   Hematocrit 36.0 - 46.0 % 32.5  35.1  33.7   Platelets 150 - 400 K/uL 323  295  270         Latest Ref Rng & Units 02/09/2024   10:26 AM 01/26/2024    8:53 AM 01/11/2024   10:16 AM  CMP  Glucose 70 - 99 mg/dL 867  857  879   BUN 8 - 23 mg/dL 6  10  8    Creatinine 0.44 - 1.00 mg/dL 9.41  9.36  9.41   Sodium 135 - 145 mmol/L 139  138  140   Potassium 3.5 - 5.1 mmol/L 3.9  3.5  3.6   Chloride 98 - 111 mmol/L 101  99  105   CO2 22 - 32 mmol/L 28  27  30    Calcium  8.9 - 10.3 mg/dL 9.6  9.8  9.3   Total Protein 6.5 - 8.1 g/dL 7.7  7.7  7.1   Total Bilirubin 0.0 - 1.2 mg/dL 0.4  0.4  0.3   Alkaline Phos 38 - 126 U/L 247  219  178   AST 15 - 41 U/L 29  24  22    ALT 0 - 44 U/L 36  19  27       RADIOGRAPHIC STUDIES: I have personally reviewed the radiological images as listed and agreed with the findings in the report. No results found.    No orders of the defined types were placed in this encounter.  All questions were answered. The patient knows to call the clinic with any  problems, questions or concerns. No barriers to learning was detected. The total time spent in the appointment was 30 minutes, including review of chart and various tests results, discussions about plan of care and coordination of care plan     Onita Mattock, MD 02/09/2024

## 2024-02-09 NOTE — Patient Instructions (Signed)
 [facility] - A DEPT OF [facility]  Discharge Instructions: Thank you for choosing [facility] to provide your oncology and hematology care.   If you have a lab appointment with the Cancer Center, please go directly to the Cancer Center and check in at the registration area.   Wear comfortable clothing and clothing appropriate for easy access to any Portacath or PICC line.   We strive to give you quality time with your provider. You may need to reschedule your appointment if you arrive late (15 or more minutes).  Arriving late affects you and other patients whose appointments are after yours.  Also, if you miss three or more appointments without notifying the office, you may be dismissed from the clinic at the provider's discretion.      For prescription refill requests, have your pharmacy contact our office and allow 72 hours for refills to be completed.    Today you received the following chemotherapy and/or immunotherapy agents: bevacizumab-awwb (MVASI)       To help prevent nausea and vomiting after your treatment, we encourage you to take your nausea medication as directed.  BELOW ARE SYMPTOMS THAT SHOULD BE REPORTED IMMEDIATELY: *FEVER GREATER THAN 100.4 F (38 C) OR HIGHER *CHILLS OR SWEATING *NAUSEA AND VOMITING THAT IS NOT CONTROLLED WITH YOUR NAUSEA MEDICATION *UNUSUAL SHORTNESS OF BREATH *UNUSUAL BRUISING OR BLEEDING *URINARY PROBLEMS (pain or burning when urinating, or frequent urination) *BOWEL PROBLEMS (unusual diarrhea, constipation, pain near the anus) TENDERNESS IN MOUTH AND THROAT WITH OR WITHOUT PRESENCE OF ULCERS (sore throat, sores in mouth, or a toothache) UNUSUAL RASH, SWELLING OR PAIN  UNUSUAL VAGINAL DISCHARGE OR ITCHING   Items with * indicate a potential emergency and should be followed up as soon as possible or go to the Emergency Department if any problems should occur.  Please show the CHEMOTHERAPY ALERT CARD  or IMMUNOTHERAPY ALERT CARD at check-in to the Emergency Department and triage nurse.  Should you have questions after your visit or need to cancel or reschedule your appointment, please contact [facility] - A DEPT OF [facility]  Dept: [phone]  and follow the prompts.  Office hours are 8:00 a.m. to 4:30 p.m. Monday - Friday. Please note that voicemails left after 4:00 p.m. may not be returned until the following business day.  We are closed weekends and major holidays. You have access to a nurse at all times for urgent questions. Please call the main number to the clinic Dept: [phone] and follow the prompts.   For any non-urgent questions, you may also contact your provider using MyChart. We now offer e-Visits for anyone 30 and older to request care online for non-urgent symptoms. For details visit [email or web address].   Also download the MyChart app! Go to the app store, search "MyChart", open the app, select [place], and log in with your MyChart username and password.

## 2024-02-10 NOTE — Telephone Encounter (Signed)
 Oral Chemotherapy Pharmacist Encounter    I spoke with patient for overview of: Lonsurf  (trifluridine/tipiracil) for the treatment of metastatic colon cancer, in conjunction with bevacizumab , planned duration until disease progression or unacceptable toxicity.   Treatment goal: Palliative   Counseled patient on administration, dosing, side effects, monitoring, drug-food interactions, safe handling, storage, and disposal.   Patient will take Lonsurf  20mg  (trifluridine component) tablets, 3 tablets (60mg  trifluridine) by mouth twice daily, within 1 hour of finishing AM & PM meals. For cycle 1 patient will take Lonsurf  on days 1-5 and 8-12 of the cycle (12/5-12/9 and 12/12-12/16). Per staff message from Dr. Lanny, she would like patient to start Cycle 2 of Lonsurf  on 03/06/24. Starting with Cycle 2 and thereafter, patient will follow the directions of taking Lonsurf  on days 1-5 and 15-19 of each 28 day cycle.   Lonsurf  start date: 02/11/24 PM   Adverse effects include but are not limited to: fatigue, nausea, vomiting, diarrhea, and decreased blood counts.   Nausea/Vomiting: Patient has anti-emetic on hand and knows to take it if nausea develops. We discussed that if she has nausea she can take the nausea pill 30-60 minutes prior to Lonsurf  doses if she has nausea. Diarrhea: Patient will obtain Imodium (loperamide) to have on hand if they experience diarrhea. Patient knows to alert the office of 4 or more loose stools above baseline.  Reviewed with patient importance of keeping a medication schedule and plan for any missed doses. No barriers to medication adherence identified.   Medication reconciliation performed and medication/allergy list updated.  Distress thermometer flowsheet: Distress thermometer not completed during telephone call as patient has been on previous lines of therapy.   Communication and Learning Assessment Primary learner: Patient Barriers to learning: No barriers Preferred  language: English Learning preferences: Listening Reading  All questions answered.  Ms. Birchmeier voiced understanding and appreciation.    Medication education handout placed in mail for patient. Medication calendar has been emailed to patient - we reviewed the calendar over the phone this AM. Patient knows to call the office with questions or concerns. Oral Chemotherapy Clinic phone number provided to patient.    Asberry Macintosh, PharmD, BCPS, BCOP Hematology/Oncology Clinical Pharmacist [phone] 02/10/2024 10:44 AM

## 2024-02-11 ENCOUNTER — Inpatient Hospital Stay: Payer: Medicare (Managed Care)

## 2024-02-14 NOTE — Telephone Encounter (Signed)
 Patient can expect delivery of Lonsurf  from [facility] 02/15/2024

## 2024-02-15 ENCOUNTER — Telehealth: Payer: Self-pay | Admitting: Pharmacist

## 2024-02-15 NOTE — Telephone Encounter (Signed)
 Oral Oncology Pharmacist Encounter  Called patient to check and see if Lonsurf  had been delivered. Patient informed me she received a call from the pharmacy stating they needed further clarification from MD office regarding prescription. I called the pharmacy at [phone] and clarified the days of patient's Lonsurf  cycle intentionally being 1-5 and 15-19 of every 28 day cycle. I requested that pharmacist re-mark prescription to be expedited.   Oral chemotherapy clinic will follow up regarding shipment status on 12/10. I called Lindsay [name] and updated her on the above, we discussed that when she does receive the Lonsurf , for this cycle she will only take it for 5 days, and then not resume normal dosing until 03/06/24. Patient verbalized appreciation and understanding.   Lindsay [name], PharmD, BCPS, BCOP Hematology/Oncology Clinical Pharmacist [phone] 02/15/2024 2:30 PM

## 2024-02-16 ENCOUNTER — Other Ambulatory Visit: Payer: Self-pay

## 2024-02-16 ENCOUNTER — Other Ambulatory Visit: Payer: Self-pay | Admitting: Hematology

## 2024-02-16 NOTE — Telephone Encounter (Signed)
 Oral Oncology Pharmacist Encounter  Called [facility] at [phone] to check on status of Lonsurf . Spoke with representative [employer] who stated that the Lonsurf  is in the final stages of processing and he anticipates that they will be able to call patient this afternoon for next day delivery.   I called and updated Lindsay [name] on the above - she knows to be on the look out for a phone call from [employer]. Patient knows to call me if she does not hear from the pharmacy.   Asberry Macintosh, PharmD, BCPS, BCOP Hematology/Oncology Clinical Pharmacist [phone] 02/16/2024 11:04 AM

## 2024-02-17 NOTE — Telephone Encounter (Signed)
 Oral Oncology Pharmacist Encounter  Called to follow up with Ms. Golaszewski and she confirmed that the Lonsurf  is scheduled to deliver to her home today (02/17/24). We discussed for her to start the Lonsurf  on 02/18/24 and take for 5 days (12/12-12/16) and then not resume Lonsurf  until 03/06/24. Starting on 12/29 she knows she will take Lonsurf  on days 1-5 and 15-19 of each cycle.   Patient has my contact information to call if she has any questions or concerns.   Asberry Macintosh, PharmD, BCPS, BCOP Hematology/Oncology Clinical Pharmacist [phone] 02/17/2024 10:27 AM

## 2024-02-22 ENCOUNTER — Encounter: Payer: Self-pay | Admitting: Hematology

## 2024-02-22 LAB — GUARDANT 360

## 2024-02-22 NOTE — Assessment & Plan Note (Signed)
 MSS, [name]/[name] wildtype -diagnosed 12/2019 by porta hepatis LN biopsy during EUS for work up of abdominal pain and liver lesions seen on US  and MRI. Liver biopsy 01/08/20 confirmed metastasis from primary colorectal cancer. PET scan showed hypermetabolism to known liver mets, diffuse thoracic and abdominal lymphadenopathy, a 1.8 cm LUL pulmonary nodule, and splenic flexure of colon. -treated with first line FOLFOX 01/25/20 - 10/02/20, Vectibix  added with C2. Oxali discontinued after C18 due to reaction. -switched to Xeloda  10/21/20, dose adjusted due to significant skin toxicity -due to cancer progression, treatment has been changed to FOLFIRI and bevacizumab  on 03/25/22 -will repeat CT scan after next cycle chemo  -Restaging CT scan from 06/16/2022 showed stable to mild decrease in size of treated liver metastasis. No new lesions -Her restaging CT scan on August 31, 2022 showed stable liver metastasis, and a stable 7 mm left lung nodule, indeterminate.  -restaging CT in 06/2023 showed mild disease progression in liver, I changed Beva to vectibix   - Restaging CT scan on September 27, 2023 showed stable disease -unfortunately restaging CT 01/21/2024 showed cancer progression in liver, her CEA has also significantly increased lately  -plan to change tx to Lonsurf  and Beva, she started on 02/07/2024 - Guardant360 in December 2025 showed [name]/[name]/BRAF wild-type, PTEN mutation.  No other targetable mutations.

## 2024-02-23 ENCOUNTER — Telehealth: Payer: Self-pay

## 2024-02-23 ENCOUNTER — Inpatient Hospital Stay: Payer: Medicare (Managed Care) | Admitting: Hematology

## 2024-02-23 ENCOUNTER — Inpatient Hospital Stay: Payer: Medicare (Managed Care)

## 2024-02-23 ENCOUNTER — Other Ambulatory Visit: Payer: Self-pay

## 2024-02-23 VITALS — BP 145/89 | HR 88 | Temp 97.3°F | Resp 17 | Ht 66.0 in | Wt 174.0 lb

## 2024-02-23 DIAGNOSIS — C221 Intrahepatic bile duct carcinoma: Secondary | ICD-10-CM

## 2024-02-23 DIAGNOSIS — Z5112 Encounter for antineoplastic immunotherapy: Secondary | ICD-10-CM | POA: Diagnosis not present

## 2024-02-23 DIAGNOSIS — C189 Malignant neoplasm of colon, unspecified: Secondary | ICD-10-CM

## 2024-02-23 LAB — CBC WITH DIFFERENTIAL (CANCER CENTER ONLY)
Abs Immature Granulocytes: 0.06 K/uL (ref 0.00–0.07)
Basophils Absolute: 0 K/uL (ref 0.0–0.1)
Basophils Relative: 0 %
Eosinophils Absolute: 0 K/uL (ref 0.0–0.5)
Eosinophils Relative: 0 %
HCT: 29.8 % — ABNORMAL LOW (ref 36.0–46.0)
Hemoglobin: 9.8 g/dL — ABNORMAL LOW (ref 12.0–15.0)
Immature Granulocytes: 1 %
Lymphocytes Relative: 11 %
Lymphs Abs: 1 K/uL (ref 0.7–4.0)
MCH: 27.7 pg (ref 26.0–34.0)
MCHC: 32.9 g/dL (ref 30.0–36.0)
MCV: 84.2 fL (ref 80.0–100.0)
Monocytes Absolute: 0.7 K/uL (ref 0.1–1.0)
Monocytes Relative: 8 %
Neutro Abs: 7.3 K/uL (ref 1.7–7.7)
Neutrophils Relative %: 80 %
Platelet Count: 303 K/uL (ref 150–400)
RBC: 3.54 MIL/uL — ABNORMAL LOW (ref 3.87–5.11)
RDW: 16.1 % — ABNORMAL HIGH (ref 11.5–15.5)
WBC Count: 9.1 K/uL (ref 4.0–10.5)
nRBC: 0 % (ref 0.0–0.2)

## 2024-02-23 LAB — CMP (CANCER CENTER ONLY)
ALT: 57 U/L — ABNORMAL HIGH (ref 0–44)
AST: 64 U/L — ABNORMAL HIGH (ref 15–41)
Albumin: 3.8 g/dL (ref 3.5–5.0)
Alkaline Phosphatase: 243 U/L — ABNORMAL HIGH (ref 38–126)
Anion gap: 11 (ref 5–15)
BUN: 6 mg/dL — ABNORMAL LOW (ref 8–23)
CO2: 26 mmol/L (ref 22–32)
Calcium: 9.2 mg/dL (ref 8.9–10.3)
Chloride: 97 mmol/L — ABNORMAL LOW (ref 98–111)
Creatinine: 0.56 mg/dL (ref 0.44–1.00)
GFR, Estimated: >60 mL/min (ref >60)
Glucose, Bld: 201 mg/dL — ABNORMAL HIGH (ref 70–99)
Potassium: 3.7 mmol/L (ref 3.5–5.1)
Sodium: 133 mmol/L — ABNORMAL LOW (ref 135–145)
Total Bilirubin: 0.8 mg/dL (ref 0.0–1.2)
Total Protein: 7.8 g/dL (ref 6.5–8.1)

## 2024-02-23 LAB — CEA (ACCESS): CEA (CHCC): 602.19 ng/mL — ABNORMAL HIGH (ref 0.00–5.00)

## 2024-02-23 MED ORDER — SODIUM CHLORIDE 0.9 % IV SOLN: INTRAVENOUS | Status: DC

## 2024-02-23 MED ORDER — SODIUM CHLORIDE 0.9 % IV SOLN
5.0000 mg/kg | Freq: Once | INTRAVENOUS | Status: AC
  Administered 2024-02-23: 11:00:00 400 mg via INTRAVENOUS
  Filled 2024-02-23: qty 16

## 2024-02-23 NOTE — Telephone Encounter (Signed)
 Received vmail from patient stating she vomited post chemo-tx.  Patient inquiring if she can take a nausea pill prior to next chemo tx.

## 2024-02-23 NOTE — Patient Instructions (Signed)
 [facility] - A DEPT OF [facility]  Discharge Instructions: Thank you for choosing [facility] to provide your oncology and hematology care.   If you have a lab appointment with the Cancer Center, please go directly to the Cancer Center and check in at the registration area.   Wear comfortable clothing and clothing appropriate for easy access to any Portacath or PICC line.   We strive to give you quality time with your provider. You may need to reschedule your appointment if you arrive late (15 or more minutes).  Arriving late affects you and other patients whose appointments are after yours.  Also, if you miss three or more appointments without notifying the office, you may be dismissed from the clinic at the provider's discretion.      For prescription refill requests, have your pharmacy contact our office and allow 72 hours for refills to be completed.    Today you received the following chemotherapy and/or immunotherapy agents Bevacizumab       To help prevent nausea and vomiting after your treatment, we encourage you to take your nausea medication as directed.  BELOW ARE SYMPTOMS THAT SHOULD BE REPORTED IMMEDIATELY: *FEVER GREATER THAN 100.4 F (38 C) OR HIGHER *CHILLS OR SWEATING *NAUSEA AND VOMITING THAT IS NOT CONTROLLED WITH YOUR NAUSEA MEDICATION *UNUSUAL SHORTNESS OF BREATH *UNUSUAL BRUISING OR BLEEDING *URINARY PROBLEMS (pain or burning when urinating, or frequent urination) *BOWEL PROBLEMS (unusual diarrhea, constipation, pain near the anus) TENDERNESS IN MOUTH AND THROAT WITH OR WITHOUT PRESENCE OF ULCERS (sore throat, sores in mouth, or a toothache) UNUSUAL RASH, SWELLING OR PAIN  UNUSUAL VAGINAL DISCHARGE OR ITCHING   Items with * indicate a potential emergency and should be followed up as soon as possible or go to the Emergency Department if any problems should occur.  Please show the CHEMOTHERAPY ALERT CARD or IMMUNOTHERAPY  ALERT CARD at check-in to the Emergency Department and triage nurse.  Should you have questions after your visit or need to cancel or reschedule your appointment, please contact [facility] - A DEPT OF [facility]  Dept: [phone]  and follow the prompts.  Office hours are 8:00 a.m. to 4:30 p.m. Monday - Friday. Please note that voicemails left after 4:00 p.m. may not be returned until the following business day.  We are closed weekends and major holidays. You have access to a nurse at all times for urgent questions. Please call the main number to the clinic Dept: [phone] and follow the prompts.   For any non-urgent questions, you may also contact your provider using MyChart. We now offer e-Visits for anyone 64 and older to request care online for non-urgent symptoms. For details visit [email or web address].   Also download the MyChart app! Go to the app store, search MyChart, open the app, select [facility], and log in with your MyChart username and password.

## 2024-02-23 NOTE — Progress Notes (Signed)
 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 Liver biopsy 01/08/20 confirmed metastasis from primary colorectal cancer. PET scan showed hypermetabolism to known liver mets, diffuse thoracic and abdominal lymphadenopathy, a 1.8 cm LUL pulmonary nodule, and splenic flexure of colon. -treated with first line FOLFOX 01/25/20 - 10/02/20, Vectibix  added with C2. Oxali discontinued after C18 due to reaction. -switched to Xeloda  10/21/20, dose adjusted due to significant skin toxicity -due to cancer progression, treatment has been changed to FOLFIRI and bevacizumab  on 03/25/22 -will repeat CT scan after next cycle chemo  -Restaging CT scan from 06/16/2022 showed stable to mild decrease in size of treated liver metastasis. No new lesions -Her restaging CT scan on August 31, 2022 showed stable liver metastasis, and a stable 7 mm left lung nodule, indeterminate.  -restaging CT in 06/2023 showed mild disease progression in liver, I changed Beva to vectibix   - Restaging CT scan on September 27, 2023 showed stable disease -unfortunately restaging CT 01/21/2024 showed cancer progression in liver, her CEA has also significantly increased lately  -plan to change tx to Lonsurf  and Beva, she started on 02/07/2024 - Guardant360 in December 2025 showed [name]/[name]/BRAF wild-type, PTEN mutation.  No other targetable  mutations.  Assessment & Plan Metastatic colon cancer She is undergoing ongoing chemotherapy with overall tolerability. During the most recent cycle, she experienced mild, self-limited gastrointestinal adverse effects, including intermittent abdominal pain, queasiness, brief episodes of constipation and diarrhea, without significant nausea or vomiting. No evidence of proteinuria on the most recent urinalysis. - Confirmed chemotherapy schedule with next cycle to begin December 29. - Confirmed follow-up appointments for January 13 and January 27 for biweekly monitoring. - Planned serial tumor marker assessment. - Planned CT scan approximately three months after initiation of treatment to evaluate response. - Reviewed prior negative urine protein test and deferred repeat testing at this visit.   Plan - She is clinically doing well, will proceed bevacizumab  today - She will start cycle 2 Lonsurf  on December 29 - Lab, office visit and bevacizumab  on December 30   SUMMARY OF ONCOLOGIC HISTORY: Oncology History  metastatic colon cancer to liver  06/20/2019 Procedure   Colonoscopy by Dr Wilhelmenia 06/20/19  IMPRESSION -Seven 3 to 10 mm polyps in the sigmoid colon, in the transverse colon and in the escending colon, removed with a cold snare. Resected and retrireved.  -One 20mm polyp in the descending colon. Biopsies. Tattoes.  -Mediaum sized lipoma in the ascending colon.   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 See Comment B. Colon, Ascending, polyp, Polypectomy:  -TUBULAR ADENOMA -No high grade dysplasia or malignancy.  C. Colon, TRansverse, Polyo, polectomy:  -TUBULAR ADENOMA -No high grade dysplasia or malignancy.  D. Colon, Sigmoid, Polyp, Polypectomy:  -HYPERPLASTIC POLYP   11/07/2019 Imaging   US  Abdomen 11/07/19  IMPRESSION: 1. Two solid masses in the liver are nonspecific.  Recommend  MRI abdomen with and without contrast for further evaluation.   12/15/2019 Imaging   MRI Abdomen 12/15/19  IMPRESSION: 1. There are two large masses in the liver with appearance favoring metastatic disease or hepatocellular carcinoma or cholangiocarcinoma. A benign etiology is highly unlikely given the enhancement pattern and associated adenopathy. 2. Considerable porta hepatis and retroperitoneal adenopathy. Some of the confluent porta hepatis tumor is potentially infiltrative and abuts the pancreatic body along its right upper margin, making it difficult to completely exclude the possibility of pancreatic adenocarcinoma primary. Possibilities helpful in further workup might include tissue diagnosis, endoscopic ultrasound, or nuclear medicine PET-CT. 3. Pancreas divisum. 4. Lumbar spondylosis and degenerative disc disease. 5. Despite efforts by the technologist and patient, motion artifact is present on today's exam and could not be eliminated. This reduces exam sensitivity and specificity.   12/22/2019 Procedure   Upper Endoscopy by Dr Rollin 12/22/19  IMPRESSION - One lymph node was visualized and measured in the porta hepatis region. Fine needle aspiration performed    12/22/2019 Initial Biopsy   A. LIVER, PORTA HEPATIS MASS, FINE NEEDLE 12/22/19 ASPIRATION:  FINAL MICROSCOPIC DIAGNOSIS:  - Malignant cells consistent with metastatic adenocarcinoma   01/01/2020 Initial Diagnosis   Intrahepatic cholangiocarcinoma (HCC)   01/08/2020 Initial Biopsy   FINAL MICROSCOPIC DIAGNOSIS:   A. LIVER, LEFT LOBE, BIOPSY:  - Metastatic adenocarcinoma, consistent with a colorectal primary.  See  comment      COMMENT:   Immunohistochemical stains show the tumor cells are positive for CK20  and CDX2 but negative for CK7, consistent with above interpretation.  Dr. Lanny was notified on 01/10/2020   01/08/2020 Genetic Testing   [name]  [name] wildtype and [name]/[name] mutations which  make her eligible for target biological agent Vectibix .   01/24/2020 Procedure   PAC placed 01/24/20   01/25/2020 -  Chemotherapy   first line FOLFOX starting 01/25/2020, Vextibix  added with C2 (02/06/20)   02/06/2020 - 02/06/2022 Chemotherapy   Patient is on Treatment Plan : COLORECTAL Panitumumab  q14d ([name] Wild - Type Gene Only)     06/20/2020 Imaging   CT CAP  IMPRESSION: 1. Continued interval reduction in size and conspicuity of a subsolid nodule of the peripheral left upper lobe. 2. Unchanged prominent pretracheal and subcarinal lymph nodes. 3. Redemonstrated partially calcified low-attenuation liver masses, slightly decreased in size compared to prior examination. 4. Slight interval decrease in size of a portacaval lymph node or conglomerate and retroperitoneal lymph nodes. 5. Findings are consistent with continued treatment response of nodal, pulmonary, and hepatic metastatic disease. 6. Coronary artery disease.   Aortic Atherosclerosis (ICD10-I70.0).     10/09/2020 Imaging   IMPRESSION: 1. Slight decrease in size of dominant liver mass with smaller liver mass within 1-2 mm of prior measurement. 2. Stable appearance of celiac lymph and retroperitoneal lymph nodes. Dominant node with calcification in the gastrohepatic ligament as described. 3. Continued decrease in size of LEFT upper lobe nodule. 4. Three-vessel coronary artery calcification. 5. Aortic atherosclerosis.   03/25/2022 - 07/16/2023 Chemotherapy   Patient is on Treatment Plan : COLORECTAL FOLFIRI + Bevacizumab  q14d     08/31/2022 Imaging    IMPRESSION: 1. Unchanged, densely calcified liver lesions, consistent with treated metastatic disease. 2. Unchanged, irregular subpleural opacity of the peripheral left upper lobe. 3. Unchanged subcentimeter gastrohepatic ligament lymph node. 4. No evidence of new metastatic disease in the chest, abdomen, or pelvis. 5. Status post hysterectomy. 6. Coronary artery  disease. 7. Aortic valve calcifications.  Correlate for echocardiographic evidence of aortic valve dysfunction.     06/22/2023 Imaging   CT Chest, Abdomen, and pelvis with contrast  IMPRESSION: Centrally calcified liver lesions are again seen and slightly increased today. Prominent nodes in the upper retroperitoneum are overall similar. Attention on follow-up.   Stable branching left upper lobe lung nodule.   08/11/2023 - 01/11/2024 Chemotherapy   Patient is on Treatment Plan : COLORECTAL FOLFIRI + Panitumumab  q14d     02/09/2024 -  Chemotherapy   Patient is on Treatment Plan : COLORECTAL Bevacizumab  + Trifluridine /Tipiracil  q28d        Discussed the use of AI scribe software for clinical note transcription with the patient, who gave verbal consent to proceed.  History of Present Illness Lindsay [name] is a 68 year old female with metastatic colon cancer to the liver who presents for routine follow-up during ongoing oral chemotherapy.  She recently completed her third 5-day cycle of oral chemotherapy, stopping on December 16th.  During this cycle she had intermittent abdominal pain and queasiness that resolved after stopping chemotherapy, without vomiting or persistent nausea.  She had brief episodes of constipation and three days of diarrhea during the cycle, all resolving without antidiarrheals. She used magnesium  and reports no other gastrointestinal symptoms.  She did not complete a urine protein test at this visit. The prior test was negative, and she has no genitourinary symptoms.     All other systems were reviewed with the patient and are negative.  MEDICAL HISTORY:  Past Medical History:  Diagnosis Date   Arthritis    Diabetes mellitus without complication (HCC)    Hypertension    met colon ca to liver 01/2020    SURGICAL HISTORY: Past Surgical History:  Procedure Laterality Date   ABDOMINAL HYSTERECTOMY     ANTERIOR AND POSTERIOR REPAIR WITH SACROSPINOUS FIXATION  N/A 07/16/2016   Procedure: ANTERIOR AND POSTERIOR REPAIR WITH SACROSPINOUS FIXATION;  Surgeon: Curlene Agent, MD;  Location: [facility];  Service: Gynecology;  Laterality: N/A;   CYSTOSCOPY  07/16/2016   Procedure: CYSTOSCOPY;  Surgeon: Curlene Agent, MD;  Location: [facility];  Service: Gynecology;;   ESOPHAGOGASTRODUODENOSCOPY (EGD) WITH PROPOFOL  N/A 12/22/2019   Procedure: ESOPHAGOGASTRODUODENOSCOPY (EGD) WITH PROPOFOL ;  Surgeon: [name], MD;  Location: [facility];  Service: Endoscopy;  Laterality: N/A;   FINE NEEDLE ASPIRATION N/A 12/22/2019   Procedure: FINE NEEDLE ASPIRATION (FNA) LINEAR;  Surgeon: [name], MD;  Location: [facility];  Service: Endoscopy;  Laterality: N/A;   IR IMAGING GUIDED PORT INSERTION  01/24/2020   IR IMAGING GUIDED PORT INSERTION  01/14/2024   IR REMOVAL TUN ACCESS W/ PORT W/O FL MOD SED  07/30/2023   LAPAROSCOPIC VAGINAL HYSTERECTOMY WITH SALPINGECTOMY Bilateral 07/16/2016   Procedure: LAPAROSCOPIC ASSISTED VAGINAL HYSTERECTOMY WITH SALPINGECTOMY;  Surgeon: Curlene Agent, MD;  Location: [facility];  Service: Gynecology;  Laterality: Bilateral;   LEFT HEART CATH AND CORONARY ANGIOGRAPHY N/A 05/07/2023   Procedure: LEFT HEART CATH AND CORONARY ANGIOGRAPHY;  Surgeon: Anner Alm ORN, MD;  Location: [facility];  Service: Cardiovascular;  Laterality: N/A;   MYOMECTOMY ABDOMINAL APPROACH     THYROID SURGERY     tyroid     UPPER ESOPHAGEAL ENDOSCOPIC ULTRASOUND (EUS) N/A 12/22/2019   Procedure: UPPER ESOPHAGEAL ENDOSCOPIC ULTRASOUND (EUS);  Surgeon: [name], MD;  Location: [facility];  Service: Endoscopy;  Laterality: N/A;    I have reviewed the social history and family history with the patient and they are unchanged from previous note.  ALLERGIES:  is allergic to oxaliplatin .  MEDICATIONS:  Current Outpatient Medications  Medication Sig Dispense Refill   aspirin  EC 81 MG tablet Take 1 tablet (81 mg total) by mouth daily. Swallow whole. 90  tablet 0   atorvastatin  (LIPITOR) 80 MG tablet Take 1 tablet (80 mg total) by mouth daily. 90 tablet 0   clindamycin  (CLEOCIN -T) 1 % lotion Apply topically as needed. 60 mL 0   clopidogrel  (PLAVIX ) 75 MG tablet Take 1 tablet (75 mg total) by mouth daily. 90 tablet 0   doxycycline  (VIBRA -TABS) 100 MG tablet Take 1 tablet (100 mg total) by mouth 2 (two) times daily. 60 tablet 3   ezetimibe  (ZETIA ) 10 MG tablet Take 1 tablet (10 mg total) by mouth daily. 90 tablet 3   isosorbide  mononitrate (IMDUR ) 30 MG 24 hr tablet Take 1 tablet (30 mg total) by mouth every evening. 90 tablet 3   MAGNESIUM -OXIDE 400 (240 Mg) MG tablet Take 1 tablet by mouth twice daily 60 tablet 0   metoprolol  succinate (TOPROL  XL) 25 MG 24 hr tablet Take 1 tablet (25 mg total) by mouth daily. Hold if systolic BP (top number) is less than 100 and/or heart rate is less than 55 90 tablet 3   nitroGLYCERIN  (NITROSTAT ) 0.4 MG SL tablet Place 1 tablet (0.4 mg total) under the tongue every 5 (five) minutes as needed for chest pain. 75 tablet 3   POTASSIUM CHLORIDE  PO Take by mouth every other day.     prochlorperazine  (COMPAZINE ) 10 MG tablet Take 1 tablet (10 mg total) by mouth every 6 (six) hours as needed for nausea or vomiting. 90 tablet 0   ranolazine  (RANEXA ) 500 MG 12 hr tablet Take 1 tablet (500 mg total) by mouth 2 (two) times daily. 180 tablet 3   trifluridine -tipiracil  (LONSURF ) 20-8.19 MG tablet Take 3 tablets (60 mg of trifluridine  total) by mouth 2 (two) times daily after a meal. Take within 1 hr after AM & PM meals on days 1-5, and 15-19. Repeat every 28 days. 60 tablet 1   No current facility-administered medications for this visit.   Facility-Administered Medications Ordered in Other Visits  Medication Dose Route Frequency Provider Last Rate Last Admin   0.9 %  sodium chloride  infusion   Intravenous Continuous [name], MD   Stopped at 02/23/24 1126    PHYSICAL EXAMINATION: ECOG PERFORMANCE STATUS: 1 - Symptomatic  but completely ambulatory  Vitals:   02/23/24 1013 02/23/24 1014  BP: (!) 162/92 (!) 145/89  Pulse: 90 88  Resp:    Temp:    SpO2: 100% 100%   Wt Readings from Last 3 Encounters:  02/23/24 174 lb (78.9 kg)  02/09/24 175 lb (79.4 kg)  01/26/24 173 lb 6.4 oz (78.7 kg)     GENERAL:alert, no distress and comfortable SKIN: skin color, texture, turgor are normal, no rashes or significant lesions EYES: normal, Conjunctiva are pink and non-injected, sclera clear Musculoskeletal:no cyanosis of digits and no clubbing  NEURO: alert & oriented x 3 with fluent speech, no focal motor/sensory deficits  Physical Exam    LABORATORY DATA:  I have reviewed the data as listed    Latest Ref Rng & Units 02/23/2024    9:45 AM 02/09/2024   10:26 AM 01/26/2024    8:53 AM  CBC  WBC 4.0 - 10.5 K/uL 9.1  5.6  5.7   Hemoglobin 12.0 - 15.0 g/dL 9.8  89.4  88.5   Hematocrit 36.0 - 46.0 % 29.8  32.5  35.1  Platelets 150 - 400 K/uL 303  323  295         Latest Ref Rng & Units 02/23/2024    9:45 AM 02/09/2024   10:26 AM 01/26/2024    8:53 AM  CMP  Glucose 70 - 99 mg/dL 798  867  857   BUN 8 - 23 mg/dL 6  6  10    Creatinine 0.44 - 1.00 mg/dL 9.43  9.41  9.36   Sodium 135 - 145 mmol/L 133  139  138   Potassium 3.5 - 5.1 mmol/L 3.7  3.9  3.5   Chloride 98 - 111 mmol/L 97  101  99   CO2 22 - 32 mmol/L 26  28  27    Calcium  8.9 - 10.3 mg/dL 9.2  9.6  9.8   Total Protein 6.5 - 8.1 g/dL 7.8  7.7  7.7   Total Bilirubin 0.0 - 1.2 mg/dL 0.8  0.4  0.4   Alkaline Phos 38 - 126 U/L 243  247  219   AST 15 - 41 U/L 64  29  24   ALT 0 - 44 U/L 57  36  19       RADIOGRAPHIC STUDIES: I have personally reviewed the radiological images as listed and agreed with the findings in the report. No results found.    No orders of the defined types were placed in this encounter.  All questions were answered. The patient knows to call the clinic with any problems, questions or concerns. No barriers to learning  was detected. The total time spent in the appointment was 25 minutes, including review of chart and various tests results, discussions about plan of care and coordination of care plan     Onita Mattock, MD 02/23/2024

## 2024-02-25 ENCOUNTER — Inpatient Hospital Stay: Payer: Medicare (Managed Care)

## 2024-03-01 ENCOUNTER — Other Ambulatory Visit: Payer: Self-pay | Admitting: Nurse Practitioner

## 2024-03-01 DIAGNOSIS — C189 Malignant neoplasm of colon, unspecified: Secondary | ICD-10-CM

## 2024-03-06 NOTE — Progress Notes (Signed)
 " Patient Care Team: [name], MD as PCP - General (Internal Medicine) [name], DO as PCP - Cardiology (Cardiology) [name], MD as Consulting Physician (Oncology) [name], MD as Consulting Physician (Gastroenterology)  Clinic Day:  03/07/2024  Referring physician: [name], MD  ASSESSMENT & PLAN:   Assessment & Plan: metastatic colon 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 Liver biopsy 01/08/20 confirmed metastasis from primary colorectal cancer. PET scan showed hypermetabolism to known liver mets, diffuse thoracic and abdominal lymphadenopathy, a 1.8 cm LUL pulmonary nodule, and splenic flexure of colon. -treated with first line FOLFOX 01/25/20 - 10/02/20, Vectibix  added with C2. Oxali discontinued after C18 due to reaction. -switched to Xeloda  10/21/20, dose adjusted due to significant skin toxicity -due to cancer progression, treatment has been changed to FOLFIRI and bevacizumab  on 03/25/22 -will repeat CT scan after next cycle chemo  -Restaging CT scan from 06/16/2022 showed stable to mild decrease in size of treated liver metastasis. No new lesions -Her restaging CT scan on August 31, 2022 showed stable liver metastasis, and a stable 7 mm left lung nodule, indeterminate.  -restaging CT in 06/2023 showed mild disease progression in liver, I changed Beva to vectibix   - Restaging CT scan on September 27, 2023 showed stable disease -unfortunately restaging CT 01/21/2024 showed cancer progression in liver, her CEA has also significantly increased lately  -plan to change tx to Lonsurf  and Beva, she started on 02/07/2024 - Guardant360 in December 2025 showed [name]/[name]/BRAF wild-type, PTEN mutation.  No other targetable mutations. - 03/07/2024 -managing treatment with Lonsurf  and bevacizumab  with minimal negative side effects.  Will proceed with treatment as  scheduled.  Continue current therapy.  +  Peripheral neuropathy Patient has baseline peripheral neuropathy.  No worse than previous check.  Managing with movement and stretching.  Declines addition of gabapentin for now.  Diarrhea/constipation Patient states she often switches from diarrhea to constipation.  More often constipated.  Will take MiraLAX  and/or senna constipation.  She denies belly pain.  Will get nauseated at times related to worsened constipation.  Nausea Last treatment with bevacizumab  caused nausea.  States she vomited on the way home during last increase.  Does have Compazine  with her.  Was unsure if she could take prior to treatment.  Encouraged her to take while in infusion to help prevent nausea.  Patient voiced understanding.  Colon cancer Currently on regimen of Lonsurf  and bevacizumab .  Does get nauseated.  Using antiemetics which are effective.  CEA improving.  Today 341, down from 602.13 at last check.  Continue current regimen.  Plan Labs reviewed. - With Hgb 9.2 and HCT 28.3.  No requirement for blood transfusion or iron infusion today.  Will monitor prior to every treatment. - Unremarkable CMP other than ALT of 55. - Improved CEA. Discussed proactive treatments for nausea and constipation/diarrhea. Continue Lonsurf  3 pills twice daily on days 1 through 5 and 15 through 19 every 28 days. Proceed with bevacizumab  today. Plan for labs/flush, follow-up, and subsequent treatment as scheduled.  The patient understands the plans discussed today and is in agreement with them.  She knows to contact our office if she develops concerns prior to her next appointment.  I provided 25 minutes of face-to-face time during this encounter and > 50% was spent counseling as documented under my assessment and plan.    Powell FORBES Lessen, NP  [facility] - A DEPT OF [facility] [address] [place]  [address] Dept: [phone] Dept Fax: [phone]   No orders of the defined types were placed in this encounter.     CHIEF COMPLAINT:  CC: Metastatic colon cancer  Current Treatment: Lonsurf  and bevacizumab   INTERVAL HISTORY:  Lindsay [name] is here today for repeat clinical assessment.  Last saw Dr.Feng on 02/23/2024.  She started cycle 2 of Lonsurf  on 03/06/2024.  Today, she presents for cycle 2 day 1 bevacizumab .  She is overall tolerating this well.  Continues to have baseline neuropathy without changes.  She does have occasional diarrhea.  More often, she has constipation.  Managing with MiraLAX  and senna.  Will take Imodium if needed for diarrhea.  Was to get nauseated on the day of treatment.  Will take prescribed Compazine  which is effective.  She denies chest pain, chest pressure, or shortness of breath.  She continues to see cardiology due to CAD.  She denies headaches or visual disturbances. She denies abdominal pain  She denies fevers or chills. She denies pain. Her appetite is good. Her weight has been stable.  I have reviewed the past medical history, past surgical history, social history and family history with the patient and they are unchanged from previous note.  ALLERGIES:  is allergic to oxaliplatin .  MEDICATIONS:  Current Outpatient Medications  Medication Sig Dispense Refill   aspirin  EC 81 MG tablet Take 1 tablet (81 mg total) by mouth daily. Swallow whole. 90 tablet 0   atorvastatin  (LIPITOR) 80 MG tablet Take 1 tablet (80 mg total) by mouth daily. 90 tablet 0   clindamycin  (CLEOCIN -T) 1 % lotion Apply topically as needed. 60 mL 0   clopidogrel  (PLAVIX ) 75 MG tablet Take 1 tablet (75 mg total) by mouth daily. 90 tablet 0   doxycycline  (VIBRA -TABS) 100 MG tablet Take 1 tablet (100 mg total) by mouth 2 (two) times daily. 60 tablet 3   ezetimibe  (ZETIA ) 10 MG tablet Take 1 tablet (10 mg total) by mouth daily. 90 tablet 3   isosorbide  mononitrate (IMDUR ) 30 MG 24 hr tablet Take  1 tablet (30 mg total) by mouth every evening. 90 tablet 3   MAGNESIUM -OXIDE 400 (240 Mg) MG tablet Take 1 tablet by mouth twice daily 60 tablet 0   metoprolol  succinate (TOPROL  XL) 25 MG 24 hr tablet Take 1 tablet (25 mg total) by mouth daily. Hold if systolic BP (top number) is less than 100 and/or heart rate is less than 55 90 tablet 3   nitroGLYCERIN  (NITROSTAT ) 0.4 MG SL tablet Place 1 tablet (0.4 mg total) under the tongue every 5 (five) minutes as needed for chest pain. 75 tablet 3   POTASSIUM CHLORIDE  PO Take by mouth every other day.     prochlorperazine  (COMPAZINE ) 10 MG tablet Take 1 tablet (10 mg total) by mouth every 6 (six) hours as needed for nausea or vomiting. 90 tablet 0   ranolazine  (RANEXA ) 500 MG 12 hr tablet Take 1 tablet (500 mg total) by mouth 2 (two) times daily. 180 tablet 3   cephALEXin  (KEFLEX ) 500 MG capsule Take 1 capsule (500 mg total) by mouth 4 (four) times daily. 28 capsule 0   trifluridine -tipiracil  (LONSURF ) 20-8.19 MG tablet Take 3 tablets (60 mg of trifluridine  total) by mouth 2 (two) times daily after a meal. Take within 1 hr after AM & PM meals on days 1-5, and  15-19. Repeat every 28 days. 60 tablet 1   No current facility-administered medications for this visit.    HISTORY OF PRESENT ILLNESS:   Oncology History  metastatic colon cancer to liver  06/20/2019 Procedure   Colonoscopy by Dr Wilhelmenia 06/20/19  IMPRESSION -Seven 3 to 10 mm polyps in the sigmoid colon, in the transverse colon and in the escending colon, removed with a cold snare. Resected and retrireved.  -One 20mm polyp in the descending colon. Biopsies. Tattoes.  -Mediaum sized lipoma in the ascending colon.   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 See Comment B. Colon, Ascending, polyp, Polypectomy:  -TUBULAR ADENOMA -No high grade dysplasia or malignancy.  C. Colon, TRansverse, Polyo, polectomy:   -TUBULAR ADENOMA -No high grade dysplasia or malignancy.  D. Colon, Sigmoid, Polyp, Polypectomy:  -HYPERPLASTIC POLYP   11/07/2019 Imaging   US  Abdomen 11/07/19  IMPRESSION: 1. Two solid masses in the liver are nonspecific. Recommend MRI abdomen with and without contrast for further evaluation.   12/15/2019 Imaging   MRI Abdomen 12/15/19  IMPRESSION: 1. There are two large masses in the liver with appearance favoring metastatic disease or hepatocellular carcinoma or cholangiocarcinoma. A benign etiology is highly unlikely given the enhancement pattern and associated adenopathy. 2. Considerable porta hepatis and retroperitoneal adenopathy. Some of the confluent porta hepatis tumor is potentially infiltrative and abuts the pancreatic body along its right upper margin, making it difficult to completely exclude the possibility of pancreatic adenocarcinoma primary. Possibilities helpful in further workup might include tissue diagnosis, endoscopic ultrasound, or nuclear medicine PET-CT. 3. Pancreas divisum. 4. Lumbar spondylosis and degenerative disc disease. 5. Despite efforts by the technologist and patient, motion artifact is present on today's exam and could not be eliminated. This reduces exam sensitivity and specificity.   12/22/2019 Procedure   Upper Endoscopy by Dr Rollin 12/22/19  IMPRESSION - One lymph node was visualized and measured in the porta hepatis region. Fine needle aspiration performed    12/22/2019 Initial Biopsy   A. LIVER, PORTA HEPATIS MASS, FINE NEEDLE 12/22/19 ASPIRATION:  FINAL MICROSCOPIC DIAGNOSIS:  - Malignant cells consistent with metastatic adenocarcinoma   01/01/2020 Initial Diagnosis   Intrahepatic cholangiocarcinoma (HCC)   01/08/2020 Initial Biopsy   FINAL MICROSCOPIC DIAGNOSIS:   A. LIVER, LEFT LOBE, BIOPSY:  - Metastatic adenocarcinoma, consistent with a colorectal primary.  See  comment      COMMENT:   Immunohistochemical stains  show the tumor cells are positive for CK20  and CDX2 but negative for CK7, consistent with above interpretation.  Dr. 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   01/24/2020 Procedure   PAC placed 01/24/20   01/25/2020 -  Chemotherapy   first line FOLFOX starting 01/25/2020, Vextibix  added with C2 (02/06/20)   02/06/2020 - 02/06/2022 Chemotherapy   Patient is on Treatment Plan : COLORECTAL Panitumumab  q14d ([name] Wild - Type Gene Only)     06/20/2020 Imaging   CT CAP  IMPRESSION: 1. Continued interval reduction in size and conspicuity of a subsolid nodule of the peripheral left upper lobe. 2. Unchanged prominent pretracheal and subcarinal lymph nodes. 3. Redemonstrated partially calcified low-attenuation liver masses, slightly decreased in size compared to prior examination. 4. Slight interval decrease in size of a portacaval lymph node or conglomerate and retroperitoneal lymph nodes. 5. Findings are consistent  with continued treatment response of nodal, pulmonary, and hepatic metastatic disease. 6. Coronary artery disease.   Aortic Atherosclerosis (ICD10-I70.0).     10/09/2020 Imaging   IMPRESSION: 1. Slight decrease in size of dominant liver mass with smaller liver mass within 1-2 mm of prior measurement. 2. Stable appearance of celiac lymph and retroperitoneal lymph nodes. Dominant node with calcification in the gastrohepatic ligament as described. 3. Continued decrease in size of LEFT upper lobe nodule. 4. Three-vessel coronary artery calcification. 5. Aortic atherosclerosis.   03/25/2022 - 07/16/2023 Chemotherapy   Patient is on Treatment Plan : COLORECTAL FOLFIRI + Bevacizumab  q14d     08/31/2022 Imaging    IMPRESSION: 1. Unchanged, densely calcified liver lesions, consistent with treated metastatic disease. 2. Unchanged, irregular  subpleural opacity of the peripheral left upper lobe. 3. Unchanged subcentimeter gastrohepatic ligament lymph node. 4. No evidence of new metastatic disease in the chest, abdomen, or pelvis. 5. Status post hysterectomy. 6. Coronary artery disease. 7. Aortic valve calcifications. Correlate for echocardiographic evidence of aortic valve dysfunction.     06/22/2023 Imaging   CT Chest, Abdomen, and pelvis with contrast  IMPRESSION: Centrally calcified liver lesions are again seen and slightly increased today. Prominent nodes in the upper retroperitoneum are overall similar. Attention on follow-up.   Stable branching left upper lobe lung nodule.   08/11/2023 - 01/11/2024 Chemotherapy   Patient is on Treatment Plan : COLORECTAL FOLFIRI + Panitumumab  q14d     02/09/2024 -  Chemotherapy   Patient is on Treatment Plan : COLORECTAL Bevacizumab  + Trifluridine /Tipiracil  q28d         REVIEW OF SYSTEMS:   Constitutional: Denies fevers, chills or abnormal weight loss Eyes: Denies blurriness of vision Ears, nose, mouth, throat, and face: Denies mucositis or sore throat Respiratory: Denies cough, dyspnea or wheezes Cardiovascular: Denies palpitation, chest discomfort or lower extremity swelling Gastrointestinal: Reports nausea with vomiting on day of treatment with bevacizumab .  Otherwise denies nausea.  Alternates between constipation and diarrhea.  Reports more constipation.  Managed with MiraLAX  and senna. Skin: Denies abnormal skin rashes Lymphatics: Denies new lymphadenopathy or easy bruising Neurological:Denies numbness, tingling or new weaknesses Behavioral/Psych: Mood is stable, no new changes  All other systems were reviewed with the patient and are negative.   VITALS:   Today's Vitals   03/07/24 1200  BP: 129/79  Pulse: 85  Resp: 15  Temp: 98 F (36.7 C)  TempSrc: Temporal  SpO2: 97%  Weight: 173 lb 11.2 oz (78.8 kg)  Height: 5' 6 (1.676 m)  PainSc: 0-No pain   Body mass  index is 28.04 kg/m.   Wt Readings from Last 3 Encounters:  03/21/24 176 lb 8 oz (80.1 kg)  03/07/24 173 lb 11.2 oz (78.8 kg)  02/23/24 174 lb (78.9 kg)    Body mass index is 28.04 kg/m.  Performance status (ECOG): 1 - Symptomatic but completely ambulatory  PHYSICAL EXAM:   GENERAL:alert, no distress and comfortable SKIN: skin color, texture, turgor are normal, no rashes or significant lesions EYES: normal, Conjunctiva are pink and non-injected, sclera clear OROPHARYNX:no exudate, no erythema and lips, buccal mucosa, and tongue normal  NECK: supple, thyroid normal size, non-tender, without nodularity LYMPH:  no palpable lymphadenopathy in the cervical, axillary or inguinal LUNGS: clear to auscultation and percussion with normal breathing effort HEART: regular rate & rhythm and no murmurs and no lower extremity edema ABDOMEN:abdomen soft, non-tender and normal bowel sounds Musculoskeletal:no cyanosis of digits and no clubbing  NEURO: alert & oriented x  3 with fluent speech, no focal motor/sensory deficits  LABORATORY DATA:  I have reviewed the data as listed    Component Value Date/Time   NA 141 03/21/2024 0856   K 3.8 03/21/2024 0856   CL 102 03/21/2024 0856   CO2 27 03/21/2024 0856   GLUCOSE 136 (H) 03/21/2024 0856   BUN 6 (L) 03/21/2024 0856   CREATININE 0.55 03/21/2024 0856   CALCIUM  9.1 03/21/2024 0856   PROT 7.1 03/21/2024 0856   PROT 7.2 07/09/2023 1031   ALBUMIN 3.3 (L) 03/21/2024 0856   ALBUMIN 4.2 07/09/2023 1031   AST 25 03/21/2024 0856   ALT 18 03/21/2024 0856   ALKPHOS 181 (H) 03/21/2024 0856   BILITOT 0.4 03/21/2024 0856   GFRNONAA >60 03/21/2024 0856   GFRAA >60 07/09/2016 0836     Lab Results  Component Value Date   WBC 6.5 03/21/2024   NEUTROABS 4.9 03/21/2024   HGB 8.5 (L) 03/21/2024   HCT 26.6 (L) 03/21/2024   MCV 86.4 03/21/2024   PLT 419 (H) 03/21/2024     RADIOGRAPHIC STUDIES: CT ABDOMEN PELVIS W CONTRAST Result Date:  03/10/2024 CLINICAL DATA:  Sepsis EXAM: CT ABDOMEN AND PELVIS WITH CONTRAST TECHNIQUE: Multidetector CT imaging of the abdomen and pelvis was performed using the standard protocol following bolus administration of intravenous contrast. RADIATION DOSE REDUCTION: This exam was performed according to the departmental dose-optimization program which includes automated exposure control, adjustment of the mA and/or kV according to patient size and/or use of iterative reconstruction technique. CONTRAST:  75mL OMNIPAQUE  IOHEXOL  350 MG/ML SOLN COMPARISON:  CT 01/19/2024, 06/22/2023, 09/27/2023 FINDINGS: Lower chest: See separately dictated chest CT. Hepatobiliary: Partially calcified hepatic metastatic disease again noted. Increased size of previously measured lesions. Central right hepatic lesion measures 10.7 x 9 cm compared with 8.4 x 7 cm previously. Adjacent hepatic dome lesion is also increased in size. Left hepatic mass measures 5.9 x 3.6 cm, previously 4.3 cm. No calcified gallstone. Mild dilatation of biliary radicles in the right hepatic lobe. Pancreas: Unremarkable. No pancreatic ductal dilatation or surrounding inflammatory changes. Spleen: Normal in size without focal abnormality. Adrenals/Urinary Tract: Adrenal glands are unremarkable. Fullness of the renal collecting system on the left. Bladder is markedly distended. Stomach/Bowel: Stomach is within normal limits. Appendix appears normal. No evidence of bowel wall thickening, distention, or inflammatory changes. Vascular/Lymphatic: Aortic atherosclerosis. No enlarged abdominal or pelvic lymph nodes. Reproductive: Hysterectomy.  No suspicious adnexal mass Other: No ascites or free air. Musculoskeletal: Right hip replacement. No acute finding. Mild supra-acetabular cortical thickening and lucency is chronic. IMPRESSION: 1. No CT evidence for acute intra-abdominal or pelvic abnormality. 2. Progression of hepatic metastatic disease. 3. Markedly distended urinary  bladder with fullness of the left renal collecting system. 4. Aortic atherosclerosis. Aortic Atherosclerosis (ICD10-I70.0). Electronically Signed   By: Luke Bun M.D.   On: 03/10/2024 17:35   CT Angio Chest PE W and/or Wo Contrast Result Date: 03/10/2024 EXAM: CTA CHEST 03/10/2024 04:27:45 PM TECHNIQUE: CTA of the chest was performed without and with the administration of 75 mL of iohexol  (OMNIPAQUE ) 350 MG/ML injection. Multiplanar reformatted images are provided for review. MIP images are provided for review. Automated exposure control, iterative reconstruction, and/or weight based adjustment of the mA/kV was utilized to reduce the radiation dose to as low as reasonably achievable. COMPARISON: None available. CLINICAL HISTORY: Pulmonary embolism (PE) suspected, low to intermediate prob, neg D-dimer. FINDINGS: PULMONARY ARTERIES: Pulmonary arteries are adequately opacified for evaluation. No acute pulmonary embolus. The main pulmonary  artery is normal in caliber. MEDIASTINUM: The heart demonstrates 4-vessel coronary artery calcifications, aortic valve leaflet calcification, and mild mitral annular calcification. An accessed right chest wall port-a-cath is present with its tip at the inferior cavoatrial junction; retraction by 6 cm is recommended. The pericardium demonstrates no acute abnormality. The thoracic aorta is normal in caliber with moderate atherosclerotic plaque. Status post right thyroidectomy. LYMPH NODES: Interval increase in size of mediastinal lymph nodes measuring borderline enlarged. As an example, a 1 cm right paratracheal lymph node (4:50). No hilar or axillary lymphadenopathy. LUNGS AND PLEURA: Stable subpleural left upper lobe 1.4 x 0.4 cm pulmonary nodule. Bibasilar atelectasis. No focal consolidation or pulmonary edema. No evidence of pleural effusion or pneumothorax. UPPER ABDOMEN: Limited images of the upper abdomen are unremarkable. SOFT TISSUES AND BONES: Bilateral mild-to-moderate  shoulder degenerative changes. No acute soft tissue abnormality. IMPRESSION: 1. No pulmonary embolism. 2. Right chest wall port-a-cath tip at the inferior cavoatrial junction; recommend retraction by 6 cm. 3. Stable subpleural left upper lobe 1.4 x 0.4 cm pulmonary nodule; consider non-contrast chest CT at 3 months, PET/CT, or tissue sampling as per Fleischner Society Guidelines. 4. Interval increase in size of mediastinal lymph nodes measuring borderline enlarged. No hilar or axillary lymphadenopathy. 5. Please see separately dictated CT abdomen and pelvis 03/10/2024. Electronically signed by: Morgane Naveau MD 03/10/2024 05:29 PM EST RP Workstation: [id number]   DG Chest 2 View Result Date: 03/10/2024 CLINICAL DATA:  Shortness of breath, chest pain EXAM: CHEST - 2 VIEW COMPARISON:  May 05, 2023 FINDINGS: The heart size and mediastinal contours are within normal limits. Right internal jugular Port-A-Cath is unchanged. Both lungs are clear. The visualized skeletal structures are unremarkable. IMPRESSION: No active cardiopulmonary disease. Electronically Signed   By: Lynwood Landy Raddle M.D.   On: 03/10/2024 13:14   "

## 2024-03-06 NOTE — Assessment & Plan Note (Addendum)
 MSS, [name]/[name] wildtype -diagnosed 12/2019 by porta hepatis LN biopsy during EUS for work up of abdominal pain and liver lesions seen on US  and MRI. Liver biopsy 01/08/20 confirmed metastasis from primary colorectal cancer. PET scan showed hypermetabolism to known liver mets, diffuse thoracic and abdominal lymphadenopathy, a 1.8 cm LUL pulmonary nodule, and splenic flexure of colon. -treated with first line FOLFOX 01/25/20 - 10/02/20, Vectibix  added with C2. Oxali discontinued after C18 due to reaction. -switched to Xeloda  10/21/20, dose adjusted due to significant skin toxicity -due to cancer progression, treatment has been changed to FOLFIRI and bevacizumab  on 03/25/22 -will repeat CT scan after next cycle chemo  -Restaging CT scan from 06/16/2022 showed stable to mild decrease in size of treated liver metastasis. No new lesions -Her restaging CT scan on August 31, 2022 showed stable liver metastasis, and a stable 7 mm left lung nodule, indeterminate.  -restaging CT in 06/2023 showed mild disease progression in liver, I changed Beva to vectibix   - Restaging CT scan on September 27, 2023 showed stable disease -unfortunately restaging CT 01/21/2024 showed cancer progression in liver, her CEA has also significantly increased lately  -plan to change tx to Lonsurf  and Beva, she started on 02/07/2024 - Guardant360 in December 2025 showed [name]/[name]/BRAF wild-type, PTEN mutation.  No other targetable mutations. - 03/07/2024 -managing treatment with Lonsurf  and bevacizumab  with minimal negative side effects.  Will proceed with treatment as scheduled.  Continue current therapy.

## 2024-03-07 ENCOUNTER — Inpatient Hospital Stay: Payer: Medicare (Managed Care)

## 2024-03-07 ENCOUNTER — Other Ambulatory Visit: Payer: Self-pay

## 2024-03-07 ENCOUNTER — Inpatient Hospital Stay (HOSPITAL_BASED_OUTPATIENT_CLINIC_OR_DEPARTMENT_OTHER): Payer: Medicare (Managed Care) | Admitting: Nurse Practitioner

## 2024-03-07 VITALS — BP 120/70 | HR 75 | Temp 98.6°F | Resp 18

## 2024-03-07 VITALS — BP 129/79 | HR 85 | Temp 98.0°F | Resp 15 | Ht 66.0 in | Wt 173.7 lb

## 2024-03-07 DIAGNOSIS — C221 Intrahepatic bile duct carcinoma: Secondary | ICD-10-CM

## 2024-03-07 DIAGNOSIS — C189 Malignant neoplasm of colon, unspecified: Secondary | ICD-10-CM

## 2024-03-07 DIAGNOSIS — Z5112 Encounter for antineoplastic immunotherapy: Secondary | ICD-10-CM | POA: Diagnosis not present

## 2024-03-07 LAB — CBC WITH DIFFERENTIAL (CANCER CENTER ONLY)
Abs Immature Granulocytes: 0.02 K/uL (ref 0.00–0.07)
Basophils Absolute: 0 K/uL (ref 0.0–0.1)
Basophils Relative: 0 %
Eosinophils Absolute: 0 K/uL (ref 0.0–0.5)
Eosinophils Relative: 1 %
HCT: 28.3 % — ABNORMAL LOW (ref 36.0–46.0)
Hemoglobin: 9.2 g/dL — ABNORMAL LOW (ref 12.0–15.0)
Immature Granulocytes: 0 %
Lymphocytes Relative: 15 %
Lymphs Abs: 0.8 K/uL (ref 0.7–4.0)
MCH: 27.5 pg (ref 26.0–34.0)
MCHC: 32.5 g/dL (ref 30.0–36.0)
MCV: 84.7 fL (ref 80.0–100.0)
Monocytes Absolute: 0.6 K/uL (ref 0.1–1.0)
Monocytes Relative: 11 %
Neutro Abs: 3.9 K/uL (ref 1.7–7.7)
Neutrophils Relative %: 73 %
Platelet Count: 525 K/uL — ABNORMAL HIGH (ref 150–400)
RBC: 3.34 MIL/uL — ABNORMAL LOW (ref 3.87–5.11)
RDW: 17.2 % — ABNORMAL HIGH (ref 11.5–15.5)
WBC Count: 5.3 K/uL (ref 4.0–10.5)
nRBC: 0 % (ref 0.0–0.2)

## 2024-03-07 LAB — CMP (CANCER CENTER ONLY)
ALT: 55 U/L — ABNORMAL HIGH (ref 0–44)
AST: 37 U/L (ref 15–41)
Albumin: 3.4 g/dL — ABNORMAL LOW (ref 3.5–5.0)
Alkaline Phosphatase: 237 U/L — ABNORMAL HIGH (ref 38–126)
Anion gap: 10 (ref 5–15)
BUN: 6 mg/dL — ABNORMAL LOW (ref 8–23)
CO2: 26 mmol/L (ref 22–32)
Calcium: 9.2 mg/dL (ref 8.9–10.3)
Chloride: 101 mmol/L (ref 98–111)
Creatinine: 0.47 mg/dL (ref 0.44–1.00)
GFR, Estimated: >60 mL/min
Glucose, Bld: 154 mg/dL — ABNORMAL HIGH (ref 70–99)
Potassium: 3.6 mmol/L (ref 3.5–5.1)
Sodium: 137 mmol/L (ref 135–145)
Total Bilirubin: 0.4 mg/dL (ref 0.0–1.2)
Total Protein: 7.3 g/dL (ref 6.5–8.1)

## 2024-03-07 LAB — CEA (ACCESS): CEA (CHCC): 341.51 ng/mL — ABNORMAL HIGH (ref 0.00–5.00)

## 2024-03-07 MED ORDER — SODIUM CHLORIDE 0.9 % IV SOLN: INTRAVENOUS | Status: DC

## 2024-03-07 MED ORDER — SODIUM CHLORIDE 0.9 % IV SOLN
5.0000 mg/kg | Freq: Once | INTRAVENOUS | Status: AC
  Administered 2024-03-07: 400 mg via INTRAVENOUS
  Filled 2024-03-07: qty 16

## 2024-03-07 MED ORDER — SODIUM CHLORIDE 0.9% FLUSH: 10.0000 mL | INTRAVENOUS | Status: DC | PRN

## 2024-03-10 ENCOUNTER — Emergency Department (HOSPITAL_COMMUNITY): Payer: Medicare (Managed Care)

## 2024-03-10 ENCOUNTER — Other Ambulatory Visit: Payer: Self-pay

## 2024-03-10 ENCOUNTER — Emergency Department (HOSPITAL_COMMUNITY)
Admission: EM | Admit: 2024-03-10 | Discharge: 2024-03-10 | Disposition: A | Discharge status: Home / Self Care | Payer: Medicare (Managed Care) | Attending: Emergency Medicine | Admitting: Emergency Medicine

## 2024-03-10 DIAGNOSIS — E119 Type 2 diabetes mellitus without complications: Secondary | ICD-10-CM | POA: Diagnosis not present

## 2024-03-10 DIAGNOSIS — R0609 Other forms of dyspnea: Secondary | ICD-10-CM | POA: Insufficient documentation

## 2024-03-10 DIAGNOSIS — I10 Essential (primary) hypertension: Secondary | ICD-10-CM | POA: Diagnosis not present

## 2024-03-10 DIAGNOSIS — Z7902 Long term (current) use of antithrombotics/antiplatelets: Secondary | ICD-10-CM | POA: Diagnosis not present

## 2024-03-10 DIAGNOSIS — Z7982 Long term (current) use of aspirin: Secondary | ICD-10-CM | POA: Diagnosis not present

## 2024-03-10 DIAGNOSIS — R0789 Other chest pain: Secondary | ICD-10-CM | POA: Diagnosis present

## 2024-03-10 DIAGNOSIS — R5383 Other fatigue: Secondary | ICD-10-CM | POA: Insufficient documentation

## 2024-03-10 DIAGNOSIS — Z79899 Other long term (current) drug therapy: Secondary | ICD-10-CM | POA: Insufficient documentation

## 2024-03-10 DIAGNOSIS — R5381 Other malaise: Secondary | ICD-10-CM | POA: Diagnosis not present

## 2024-03-10 DIAGNOSIS — R509 Fever, unspecified: Secondary | ICD-10-CM | POA: Insufficient documentation

## 2024-03-10 LAB — URINALYSIS, W/ REFLEX TO CULTURE (INFECTION SUSPECTED)
Bacteria, UA: NONE SEEN
Bilirubin Urine: NEGATIVE
Glucose, UA: NEGATIVE mg/dL
Hgb urine dipstick: NEGATIVE
Ketones, ur: NEGATIVE mg/dL
Nitrite: NEGATIVE
Protein, ur: NEGATIVE mg/dL
Specific Gravity, Urine: 1.026 (ref 1.005–1.030)
pH: 6 (ref 5.0–8.0)

## 2024-03-10 LAB — CBC
HCT: 26.4 % — ABNORMAL LOW (ref 36.0–46.0)
Hemoglobin: 8.6 g/dL — ABNORMAL LOW (ref 12.0–15.0)
MCH: 27.9 pg (ref 26.0–34.0)
MCHC: 32.6 g/dL (ref 30.0–36.0)
MCV: 85.7 fL (ref 80.0–100.0)
Platelets: 414 K/uL — ABNORMAL HIGH (ref 150–400)
RBC: 3.08 MIL/uL — ABNORMAL LOW (ref 3.87–5.11)
RDW: 17.5 % — ABNORMAL HIGH (ref 11.5–15.5)
WBC: 8.3 K/uL (ref 4.0–10.5)
nRBC: 0 % (ref 0.0–0.2)

## 2024-03-10 LAB — BASIC METABOLIC PANEL WITH GFR
Anion gap: 11 (ref 5–15)
BUN: 7 mg/dL — ABNORMAL LOW (ref 8–23)
CO2: 25 mmol/L (ref 22–32)
Calcium: 8.6 mg/dL — ABNORMAL LOW (ref 8.9–10.3)
Chloride: 99 mmol/L (ref 98–111)
Creatinine, Ser: 0.46 mg/dL (ref 0.44–1.00)
GFR, Estimated: >60 mL/min
Glucose, Bld: 121 mg/dL — ABNORMAL HIGH (ref 70–99)
Potassium: 3.6 mmol/L (ref 3.5–5.1)
Sodium: 134 mmol/L — ABNORMAL LOW (ref 135–145)

## 2024-03-10 LAB — RESP PANEL BY RT-PCR (RSV, FLU A&B, COVID)  RVPGX2
Influenza A by PCR: NEGATIVE
Influenza B by PCR: NEGATIVE
Resp Syncytial Virus by PCR: NEGATIVE
SARS Coronavirus 2 by RT PCR: NEGATIVE

## 2024-03-10 LAB — I-STAT CG4 LACTIC ACID, ED: Lactic Acid, Venous: 0.7 mmol/L (ref 0.5–1.9)

## 2024-03-10 LAB — TROPONIN T, HIGH SENSITIVITY
Troponin T High Sensitivity: 36 ng/L — ABNORMAL HIGH (ref 0–19)
Troponin T High Sensitivity: 31 ng/L — ABNORMAL HIGH (ref 0–19)

## 2024-03-10 MED ORDER — ACETAMINOPHEN 325 MG PO TABS
650.0000 mg | ORAL_TABLET | Freq: Once | ORAL | Status: AC
  Administered 2024-03-10: 650 mg via ORAL
  Filled 2024-03-10: qty 2

## 2024-03-10 MED ORDER — HEPARIN SOD (PORK) LOCK FLUSH 100 UNIT/ML IV SOLN
INTRAVENOUS | Status: AC
  Administered 2024-03-10: 500 [IU]
  Filled 2024-03-10: qty 5

## 2024-03-10 MED ORDER — CEPHALEXIN 500 MG PO CAPS: 500.0000 mg | ORAL_CAPSULE | Freq: Four times a day (QID) | ORAL | 0 refills | Status: AC

## 2024-03-10 MED ORDER — CEPHALEXIN 500 MG PO CAPS
500.0000 mg | ORAL_CAPSULE | Freq: Once | ORAL | Status: AC
  Administered 2024-03-10: 500 mg via ORAL
  Filled 2024-03-10: qty 1

## 2024-03-10 MED ORDER — IOHEXOL 350 MG/ML SOLN
75.0000 mL | Freq: Once | INTRAVENOUS | Status: AC | PRN
  Administered 2024-03-10: 75 mL via INTRAVENOUS

## 2024-03-10 NOTE — ED Provider Notes (Signed)
 " [place] [facility] AT [facility] Provider Note   CSN: [id number] Arrival date & time: 03/10/24  1129     Patient presents with: Chest Pain   Lindsay [name] is a 69 y.o. female.   69 y.o. female with past medical history significant for diabetes, arthritis, hypertension and metastatic colon cancer initially diagnosed in 2021 and currently undergoing chemotherapy presents with complaint of malaise, fatigue, vague chest discomfort.  Symptoms began 5 days ago.  She denies fever at home.  Patient's temperature is noted to be 101.5 in triage.  Patient denies pain or discomfort at rest.  She reports some mild dyspnea with exertion.  This appears to be ongoing issue for at least the last 1 to 2 weeks.  The history is provided by the patient and medical records.       Prior to Admission medications  Medication Sig Start Date End Date Taking? Authorizing Provider  aspirin  EC 81 MG tablet Take 1 tablet (81 mg total) by mouth daily. Swallow whole. 05/09/23   Amin, Ankit C, MD  atorvastatin  (LIPITOR) 80 MG tablet Take 1 tablet (80 mg total) by mouth daily. 05/09/23   Amin, Ankit C, MD  clindamycin  (CLEOCIN -T) 1 % lotion Apply topically as needed. 08/11/23   Thayil, Irene T, PA-C  clopidogrel  (PLAVIX ) 75 MG tablet Take 1 tablet (75 mg total) by mouth daily. 05/09/23   Amin, Ankit C, MD  doxycycline  (VIBRA -TABS) 100 MG tablet Take 1 tablet (100 mg total) by mouth 2 (two) times daily. 08/11/23   Thayil, Irene T, PA-C  ezetimibe  (ZETIA ) 10 MG tablet Take 1 tablet (10 mg total) by mouth daily. 11/16/23   Tolia, Sunit, DO  isosorbide  mononitrate (IMDUR ) 30 MG 24 hr tablet Take 1 tablet (30 mg total) by mouth every evening. 11/16/23   Tolia, Sunit, DO  MAGNESIUM -OXIDE 400 (240 Mg) MG tablet Take 1 tablet by mouth twice daily 02/16/24   [name], MD  metoprolol  succinate (TOPROL  XL) 25 MG 24 hr tablet Take 1 tablet (25 mg total) by mouth daily. Hold if systolic BP (top number) is less than 100  and/or heart rate is less than 55 08/13/23   Tolia, Sunit, DO  nitroGLYCERIN  (NITROSTAT ) 0.4 MG SL tablet Place 1 tablet (0.4 mg total) under the tongue every 5 (five) minutes as needed for chest pain. 10/11/23   Tolia, Sunit, DO  POTASSIUM CHLORIDE  PO Take by mouth every other day.    [provider]  prochlorperazine  (COMPAZINE ) 10 MG tablet Take 1 tablet (10 mg total) by mouth every 6 (six) hours as needed for nausea or vomiting. 12/01/23   Neomi Lis T, PA-C  ranolazine  (RANEXA ) 500 MG 12 hr tablet Take 1 tablet (500 mg total) by mouth 2 (two) times daily. 10/11/23   Tolia, Sunit, DO  trifluridine -tipiracil  (LONSURF ) 20-8.19 MG tablet Take 3 tablets (60 mg of trifluridine  total) by mouth 2 (two) times daily after a meal. Take within 1 hr after AM & PM meals on days 1-5, and 15-19. Repeat every 28 days. 01/26/24   [name], MD    Allergies: Oxaliplatin     Review of Systems  All other systems reviewed and are negative.   Updated Vital Signs BP (!) 127/105   Pulse 94   Temp (!) 101.5 F (38.6 C) (Oral)   Resp 18   SpO2 94%   Physical Exam Vitals and nursing note reviewed.  Constitutional:      General: She is not in  acute distress.    Appearance: She is well-developed.  HENT:     Head: Normocephalic and atraumatic.  Eyes:     Conjunctiva/sclera: Conjunctivae normal.  Cardiovascular:     Rate and Rhythm: Normal rate and regular rhythm.     Heart sounds: No murmur heard.    Comments: Port present in right anterior chest wall Pulmonary:     Effort: Pulmonary effort is normal. No respiratory distress.     Breath sounds: Normal breath sounds.  Abdominal:     Palpations: Abdomen is soft.     Tenderness: There is no abdominal tenderness.  Musculoskeletal:        General: No swelling.     Cervical back: Neck supple.  Skin:    General: Skin is warm and dry.     Capillary Refill: Capillary refill takes less than 2 seconds.  Neurological:     Mental Status: She is alert.   Psychiatric:        Mood and Affect: Mood normal.     (all labs ordered are listed, but only abnormal results are displayed) Labs Reviewed  CULTURE, BLOOD (ROUTINE X 2)  CULTURE, BLOOD (ROUTINE X 2)  RESP PANEL BY RT-PCR (RSV, FLU A&B, COVID)  RVPGX2  BASIC METABOLIC PANEL WITH GFR  CBC  URINALYSIS, W/ REFLEX TO CULTURE (INFECTION SUSPECTED)  I-STAT CG4 LACTIC ACID, ED  TROPONIN T, HIGH SENSITIVITY  TROPONIN T, HIGH SENSITIVITY    EKG: EKG Interpretation Date/Time:  Friday March 10 2024 11:36:49 EST Ventricular Rate:  89 PR Interval:  157 QRS Duration:  81 QT Interval:  364 QTC Calculation: 443 R Axis:   29  Text Interpretation: Sinus rhythm Abnormal R-wave progression, early transition Baseline wander in lead(s) III aVL Confirmed by Laurice Coy [phone]) on 03/10/2024 3:09:14 PM  Radiology: [facility] Chest 2 View Result Date: 03/10/2024 CLINICAL DATA:  Shortness of breath, chest pain EXAM: CHEST - 2 VIEW COMPARISON:  May 05, 2023 FINDINGS: The heart size and mediastinal contours are within normal limits. Right internal jugular Port-A-Cath is unchanged. Both lungs are clear. The visualized skeletal structures are unremarkable. IMPRESSION: No active cardiopulmonary disease. Electronically Signed   By: Lynwood Landy Raddle M.D.   On: 03/10/2024 13:14     Procedures   Medications Ordered in the ED  acetaminophen  (TYLENOL ) tablet 650 mg (650 mg Oral Given 03/10/24 1403)                                    Medical Decision Making Patient complains of malaise, fatigue, vague chest discomfort and associated dyspnea with exertion.  Symptoms been ongoing for at least a 1 to 2-week period.  She denies any fever at home.  Temperature of 101.5 was captured during initial triage.  No further fever was noted during evaluation.  Patient is nontoxic in appearance.  Initial screening labs are reassuringly without significant acute abnormality.  Flu and COVID testing is negative.  White count  is 8.3.  Obtained electrolytes are without significant acute abnormality.  Lactic acid is 0.7.  EKG is without evidence of acute ischemia.  Troponins are 36 and then 31.  CT imaging is without significant acute complaint noted.  No PE seen on CT.  UA is somewhat suggestive of possible early UTI.  However, patient is without any symptoms suggestive of UTI.  Patient offered admission for observation/further workup.  She prefers to go home.  I  am going to prescribe her a course of antibiotics to treat possible early UTI.  Patient understands need for close outpatient follow-up.  She understands that cultures were sent off and are pending.  CT results discussed extensively the patient.  She understands that she should discuss with her oncology team whether her port needs revision.  Importance of close follow-up stressed.  Strict return precautions given understood.  Amount and/or Complexity of Data Reviewed Labs: ordered. Radiology: ordered.  Risk OTC drugs. Prescription drug management.        Final diagnoses:  Fever, unspecified fever cause    ED Discharge Orders          Ordered    cephALEXin (KEFLEX) 500 MG capsule  4 times daily        03/10/24 1957               Laurice Maude BROCKS, MD 03/10/24 1957  "

## 2024-03-10 NOTE — ED Triage Notes (Signed)
 CP, SOB. Started a few days ago. Thought the pain was gas related, but it is persistent and worsens with movement. Denies any other symptoms.

## 2024-03-10 NOTE — Discharge Instructions (Addendum)
Return for any problem.   Take all of antibiotic as prescribed.   

## 2024-03-10 NOTE — ED Notes (Signed)
 Patient requested that port be access for blood work

## 2024-03-12 LAB — URINE CULTURE: Culture: >=10000 — AB

## 2024-03-14 ENCOUNTER — Other Ambulatory Visit: Payer: Self-pay | Admitting: Hematology

## 2024-03-14 ENCOUNTER — Encounter: Payer: Self-pay | Admitting: Hematology

## 2024-03-14 ENCOUNTER — Other Ambulatory Visit: Payer: Self-pay

## 2024-03-14 ENCOUNTER — Other Ambulatory Visit (HOSPITAL_COMMUNITY): Payer: Self-pay

## 2024-03-14 ENCOUNTER — Telehealth: Payer: Self-pay

## 2024-03-14 MED ORDER — LONSURF 20-8.19 MG PO TABS: 35.0000 mg/m2 | ORAL_TABLET | Freq: Two times a day (BID) | ORAL | 1 refills | Status: DC

## 2024-03-14 NOTE — Telephone Encounter (Signed)
 Oral Oncology Patient Advocate Encounter   Was successful in securing patient a $6000 grant from [employer] ([employer]) to provide copayment coverage for Lonsurf .  This will keep the out of pocket expense at $0.     The billing information is as follows and has been shared with [facility].   RxBin: [id number] PCN:  [id number] Member ID: [id number] Group ID: [id number] Dates of Eligibility: 09/16/23 through 03/14/25  Colorectal

## 2024-03-15 ENCOUNTER — Other Ambulatory Visit (HOSPITAL_COMMUNITY): Payer: Self-pay

## 2024-03-15 LAB — CULTURE, BLOOD (ROUTINE X 2)
Culture: NO GROWTH
Culture: NO GROWTH
Special Requests: ADEQUATE

## 2024-03-16 ENCOUNTER — Other Ambulatory Visit: Payer: Self-pay

## 2024-03-17 ENCOUNTER — Other Ambulatory Visit: Payer: Self-pay

## 2024-03-17 ENCOUNTER — Other Ambulatory Visit (HOSPITAL_COMMUNITY): Payer: Self-pay

## 2024-03-17 MED ORDER — LONSURF 20-8.19 MG PO TABS
35.0000 mg/m2 | ORAL_TABLET | Freq: Two times a day (BID) | ORAL | 1 refills | Status: AC
  Filled 2024-03-17: qty 60, 28d supply, fill #0
  Filled 2024-04-07: qty 60, 28d supply, fill #1

## 2024-03-17 NOTE — Telephone Encounter (Signed)
 Oral Oncology Patient Advocate Encounter  Patient assistance with [facility] has been canceled via phone . Due to patient filling at [facility] now.     Charlott Hamilton,  CPhT-Adv  she/her/hers [facility]  [facility] Pharmacy Technician Patient Advocate Specialist III [facility] Phone: [phone]  Fax: [phone] [email or web address] [email or web address]

## 2024-03-17 NOTE — Progress Notes (Signed)
 Oral Chemotherapy Pharmacist Encounter  Patient was counseled under telephone encounter from 02/15/24. Patient is switching pharmacies back to [facility] and will utilize grant funding to cover the cost of her Lonsurf .   Asberry Macintosh, PharmD, BCPS, BCOP Hematology/Oncology Clinical Pharmacist [phone] 03/17/2024 12:16 PM

## 2024-03-17 NOTE — Progress Notes (Signed)
 Specialty Pharmacy Initial Fill Coordination Note  Lindsay [name] is a 69 y.o. female contacted today regarding refills of specialty medication(s) Trifluridine -Tipiracil  (Lonsurf ) .  Patient requested Delivery  on 03/20/24  to verified address [address] [facility] [place] [address]   Medication will be filled on 03/17/2024.   Patient is aware of $0.00 copayment.  Use Grant on [employer]

## 2024-03-20 ENCOUNTER — Telehealth: Payer: Self-pay

## 2024-03-20 ENCOUNTER — Telehealth: Payer: Self-pay | Admitting: Pharmacist

## 2024-03-20 ENCOUNTER — Other Ambulatory Visit (HOSPITAL_COMMUNITY): Payer: Self-pay

## 2024-03-20 NOTE — Assessment & Plan Note (Signed)
 MSS, [name]/[name] wildtype -diagnosed 12/2019 by porta hepatis LN biopsy during EUS for work up of abdominal pain and liver lesions seen on US  and MRI. Liver biopsy 01/08/20 confirmed metastasis from primary colorectal cancer. PET scan showed hypermetabolism to known liver mets, diffuse thoracic and abdominal lymphadenopathy, a 1.8 cm LUL pulmonary nodule, and splenic flexure of colon. -treated with first line FOLFOX 01/25/20 - 10/02/20, Vectibix  added with C2. Oxali discontinued after C18 due to reaction. -switched to Xeloda  10/21/20, dose adjusted due to significant skin toxicity -due to cancer progression, treatment has been changed to FOLFIRI and bevacizumab  on 03/25/22 -will repeat CT scan after next cycle chemo  -Restaging CT scan from 06/16/2022 showed stable to mild decrease in size of treated liver metastasis. No new lesions -Her restaging CT scan on August 31, 2022 showed stable liver metastasis, and a stable 7 mm left lung nodule, indeterminate.  -restaging CT in 06/2023 showed mild disease progression in liver, I changed Beva to vectibix   - Restaging CT scan on September 27, 2023 showed stable disease -unfortunately restaging CT 01/21/2024 showed cancer progression in liver, her CEA has also significantly increased lately  -plan to change tx to Lonsurf  and Beva, she started on 02/07/2024 - Guardant360 in December 2025 showed [name]/[name]/BRAF wild-type, PTEN mutation.  No other targetable mutations.

## 2024-03-20 NOTE — Assessment & Plan Note (Signed)
-  overall mild and stable

## 2024-03-20 NOTE — Telephone Encounter (Signed)
 Oral Oncology Patient Advocate Encounter   Was successful in securing patient a $6000 grant from [employer] ([employer]) to provide copayment coverage for Lonsurf .  This will keep the out of pocket expense at $0.     The billing information is as follows and has been shared with [facility].   RxBin: [id number] PCN:  [id number] Member ID: [id number] Group ID: [id number] Dates of Eligibility: 09/16/2023 through 03/14/2025   Charlott Hamilton,  CPhT-Adv  she/her/hers [place]  [facility] Pharmacy Technician Patient Advocate Specialist III [facility] Phone: [phone]  Fax: [phone] [email or web address]

## 2024-03-20 NOTE — Telephone Encounter (Signed)
 Oral Oncology Pharmacist Encounter  Patient is starting next cycle of Lonsurf  on 03/21/24 AM. We discussed that patient will dose Lonsurf  this cycle on 03/21/24-03/25/24 and then on 04/04/24-04/08/24. Patient wrote down dates and understands dosing plans for this cycle. Patient has no other questions or concerns at this time.   Asberry Macintosh, PharmD, BCPS, BCOP Hematology/Oncology Clinical Pharmacist [phone] 03/20/2024 4:07 PM

## 2024-03-21 ENCOUNTER — Inpatient Hospital Stay: Payer: Medicare (Managed Care)

## 2024-03-21 ENCOUNTER — Inpatient Hospital Stay: Payer: Medicare (Managed Care) | Admitting: Hematology

## 2024-03-21 ENCOUNTER — Inpatient Hospital Stay: Payer: Medicare (Managed Care) | Attending: Hematology

## 2024-03-21 VITALS — BP 134/78 | HR 93 | Temp 97.4°F | Resp 17 | Ht 66.0 in | Wt 176.5 lb

## 2024-03-21 DIAGNOSIS — T451X5A Adverse effect of antineoplastic and immunosuppressive drugs, initial encounter: Secondary | ICD-10-CM

## 2024-03-21 DIAGNOSIS — C787 Secondary malignant neoplasm of liver and intrahepatic bile duct: Secondary | ICD-10-CM | POA: Insufficient documentation

## 2024-03-21 DIAGNOSIS — C221 Intrahepatic bile duct carcinoma: Secondary | ICD-10-CM

## 2024-03-21 DIAGNOSIS — G620 Drug-induced polyneuropathy: Secondary | ICD-10-CM

## 2024-03-21 DIAGNOSIS — Z5112 Encounter for antineoplastic immunotherapy: Secondary | ICD-10-CM | POA: Diagnosis present

## 2024-03-21 DIAGNOSIS — C19 Malignant neoplasm of rectosigmoid junction: Secondary | ICD-10-CM | POA: Diagnosis present

## 2024-03-21 DIAGNOSIS — C189 Malignant neoplasm of colon, unspecified: Secondary | ICD-10-CM

## 2024-03-21 DIAGNOSIS — Z90710 Acquired absence of both cervix and uterus: Secondary | ICD-10-CM | POA: Diagnosis not present

## 2024-03-21 DIAGNOSIS — D6481 Anemia due to antineoplastic chemotherapy: Secondary | ICD-10-CM | POA: Insufficient documentation

## 2024-03-21 DIAGNOSIS — R0609 Other forms of dyspnea: Secondary | ICD-10-CM | POA: Diagnosis not present

## 2024-03-21 DIAGNOSIS — Z79899 Other long term (current) drug therapy: Secondary | ICD-10-CM | POA: Diagnosis not present

## 2024-03-21 DIAGNOSIS — R5383 Other fatigue: Secondary | ICD-10-CM | POA: Insufficient documentation

## 2024-03-21 DIAGNOSIS — C7800 Secondary malignant neoplasm of unspecified lung: Secondary | ICD-10-CM | POA: Diagnosis not present

## 2024-03-21 DIAGNOSIS — T451X5D Adverse effect of antineoplastic and immunosuppressive drugs, subsequent encounter: Secondary | ICD-10-CM | POA: Insufficient documentation

## 2024-03-21 LAB — CBC WITH DIFFERENTIAL (CANCER CENTER ONLY)
Abs Immature Granulocytes: 0.03 K/uL (ref 0.00–0.07)
Basophils Absolute: 0 K/uL (ref 0.0–0.1)
Basophils Relative: 0 %
Eosinophils Absolute: 0 K/uL (ref 0.0–0.5)
Eosinophils Relative: 1 %
HCT: 26.6 % — ABNORMAL LOW (ref 36.0–46.0)
Hemoglobin: 8.5 g/dL — ABNORMAL LOW (ref 12.0–15.0)
Immature Granulocytes: 1 %
Lymphocytes Relative: 14 %
Lymphs Abs: 0.9 K/uL (ref 0.7–4.0)
MCH: 27.6 pg (ref 26.0–34.0)
MCHC: 32 g/dL (ref 30.0–36.0)
MCV: 86.4 fL (ref 80.0–100.0)
Monocytes Absolute: 0.6 K/uL (ref 0.1–1.0)
Monocytes Relative: 9 %
Neutro Abs: 4.9 K/uL (ref 1.7–7.7)
Neutrophils Relative %: 75 %
Platelet Count: 419 K/uL — ABNORMAL HIGH (ref 150–400)
RBC: 3.08 MIL/uL — ABNORMAL LOW (ref 3.87–5.11)
RDW: 18.9 % — ABNORMAL HIGH (ref 11.5–15.5)
WBC Count: 6.5 K/uL (ref 4.0–10.5)
nRBC: 0 % (ref 0.0–0.2)

## 2024-03-21 LAB — CMP (CANCER CENTER ONLY)
ALT: 18 U/L (ref 0–44)
AST: 25 U/L (ref 15–41)
Albumin: 3.3 g/dL — ABNORMAL LOW (ref 3.5–5.0)
Alkaline Phosphatase: 181 U/L — ABNORMAL HIGH (ref 38–126)
Anion gap: 12 (ref 5–15)
BUN: 6 mg/dL — ABNORMAL LOW (ref 8–23)
CO2: 27 mmol/L (ref 22–32)
Calcium: 9.1 mg/dL (ref 8.9–10.3)
Chloride: 102 mmol/L (ref 98–111)
Creatinine: 0.55 mg/dL (ref 0.44–1.00)
GFR, Estimated: >60 mL/min
Glucose, Bld: 136 mg/dL — ABNORMAL HIGH (ref 70–99)
Potassium: 3.8 mmol/L (ref 3.5–5.1)
Sodium: 141 mmol/L (ref 135–145)
Total Bilirubin: 0.4 mg/dL (ref 0.0–1.2)
Total Protein: 7.1 g/dL (ref 6.5–8.1)

## 2024-03-21 LAB — CEA (ACCESS): CEA (CHCC): 190.3 ng/mL — ABNORMAL HIGH (ref 0.00–5.00)

## 2024-03-21 MED ORDER — SODIUM CHLORIDE 0.9 % IV SOLN: INTRAVENOUS | Status: DC

## 2024-03-21 MED ORDER — SODIUM CHLORIDE 0.9 % IV SOLN
5.0000 mg/kg | Freq: Once | INTRAVENOUS | Status: AC
  Administered 2024-03-21: 400 mg via INTRAVENOUS
  Filled 2024-03-21: qty 16

## 2024-03-21 NOTE — Patient Instructions (Signed)
 [facility] - A DEPT OF [facility]  Discharge Instructions: Thank you for choosing [facility] to provide your oncology and hematology care.   If you have a lab appointment with the Cancer Center, please go directly to the Cancer Center and check in at the registration area.   Wear comfortable clothing and clothing appropriate for easy access to any Portacath or PICC line.   We strive to give you quality time with your provider. You may need to reschedule your appointment if you arrive late (15 or more minutes).  Arriving late affects you and other patients whose appointments are after yours.  Also, if you miss three or more appointments without notifying the office, you may be dismissed from the clinic at the provider's discretion.      For prescription refill requests, have your pharmacy contact our office and allow 72 hours for refills to be completed.    Today you received the following chemotherapy and/or immunotherapy agents: bevacizumab-awwb (MVASI)       To help prevent nausea and vomiting after your treatment, we encourage you to take your nausea medication as directed.  BELOW ARE SYMPTOMS THAT SHOULD BE REPORTED IMMEDIATELY: *FEVER GREATER THAN 100.4 F (38 C) OR HIGHER *CHILLS OR SWEATING *NAUSEA AND VOMITING THAT IS NOT CONTROLLED WITH YOUR NAUSEA MEDICATION *UNUSUAL SHORTNESS OF BREATH *UNUSUAL BRUISING OR BLEEDING *URINARY PROBLEMS (pain or burning when urinating, or frequent urination) *BOWEL PROBLEMS (unusual diarrhea, constipation, pain near the anus) TENDERNESS IN MOUTH AND THROAT WITH OR WITHOUT PRESENCE OF ULCERS (sore throat, sores in mouth, or a toothache) UNUSUAL RASH, SWELLING OR PAIN  UNUSUAL VAGINAL DISCHARGE OR ITCHING   Items with * indicate a potential emergency and should be followed up as soon as possible or go to the Emergency Department if any problems should occur.  Please show the CHEMOTHERAPY ALERT CARD  or IMMUNOTHERAPY ALERT CARD at check-in to the Emergency Department and triage nurse.  Should you have questions after your visit or need to cancel or reschedule your appointment, please contact [facility] - A DEPT OF [facility]  Dept: [phone]  and follow the prompts.  Office hours are 8:00 a.m. to 4:30 p.m. Monday - Friday. Please note that voicemails left after 4:00 p.m. may not be returned until the following business day.  We are closed weekends and major holidays. You have access to a nurse at all times for urgent questions. Please call the main number to the clinic Dept: [phone] and follow the prompts.   For any non-urgent questions, you may also contact your provider using MyChart. We now offer e-Visits for anyone 30 and older to request care online for non-urgent symptoms. For details visit [email or web address].   Also download the MyChart app! Go to the app store, search "MyChart", open the app, select [place], and log in with your MyChart username and password.

## 2024-03-21 NOTE — Progress Notes (Signed)
 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 Liver biopsy 01/08/20 confirmed metastasis from primary colorectal cancer. PET scan showed hypermetabolism to known liver mets, diffuse thoracic and abdominal lymphadenopathy, a 1.8 cm LUL pulmonary nodule, and splenic flexure of colon. -treated with first line FOLFOX 01/25/20 - 10/02/20, Vectibix  added with C2. Oxali discontinued after C18 due to reaction. -switched to Xeloda  10/21/20, dose adjusted due to significant skin toxicity -due to cancer progression, treatment has been changed to FOLFIRI and bevacizumab  on 03/25/22 -will repeat CT scan after next cycle chemo  -Restaging CT scan from 06/16/2022 showed stable to mild decrease in size of treated liver metastasis. No new lesions -Her restaging CT scan on August 31, 2022 showed stable liver metastasis, and a stable 7 mm left lung nodule, indeterminate.  -restaging CT in 06/2023 showed mild disease progression in liver, I changed Beva to vectibix   - Restaging CT scan on September 27, 2023 showed stable disease -unfortunately restaging CT 01/21/2024 showed cancer progression in liver, her CEA has also significantly increased lately  -plan to change tx to Lonsurf  and Beva, she started on 02/07/2024 - Guardant360 in December 2025  showed [name]/[name]/BRAF wild-type, PTEN mutation.  No other targetable mutations.  Assessment & Plan Metastatic colon cancer with liver metastasis Undergoing oral chemotherapy (long serve) for metastatic colon cancer with liver metastasis. Tumor marker decreased from 600 to 300, indicating response. Most recent CT scan prior to current cycle showed increased liver lesion size, which is still too early to evaluate response of Lonsurf . She is tolerating therapy without significant adverse effects. - Continue oral chemotherapy (Lonsurf ) for one additional cycle. - Repeat CT scan at end of February or early March to assess response. - Monitor tumor markers and clinical status. - Follow-up visits scheduled for January 27, February 10, and February 24. - Encouraged to remain active but avoid overexertion.  Chemotherapy-induced anemia Mild anemia (hemoglobin 8.5 g/dL) secondary to chemotherapy, with fatigue and exertional dyspnea. Hemoglobin above transfusion threshold. - Monitor hemoglobin and symptoms. - Advised to pace activities and avoid overexertion. - No transfusion indicated at this time.  Plan - Lab and the recent CT scan reviewed, plan to continue Lonsurf  and bevacizumab , tolerating well. - Lab, follow-up and next cycle bevacizumab  in 2 weeks   SUMMARY OF ONCOLOGIC HISTORY: Oncology History  metastatic colon cancer to liver  06/20/2019 Procedure   Colonoscopy by Dr Wilhelmenia 06/20/19  IMPRESSION -Seven 3 to 10 mm polyps in the sigmoid colon, in the transverse colon and in the escending colon, removed with a cold snare. Resected and retrireved.  -One 20mm polyp in the descending colon. Biopsies. Tattoes.  -Mediaum sized lipoma in the ascending colon.   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 See Comment B. Colon, Ascending, polyp, Polypectomy:  -TUBULAR ADENOMA -  No high grade dysplasia or  malignancy.  C. Colon, TRansverse, Polyo, polectomy:  -TUBULAR ADENOMA -No high grade dysplasia or malignancy.  D. Colon, Sigmoid, Polyp, Polypectomy:  -HYPERPLASTIC POLYP   11/07/2019 Imaging   US  Abdomen 11/07/19  IMPRESSION: 1. Two solid masses in the liver are nonspecific. Recommend MRI abdomen with and without contrast for further evaluation.   12/15/2019 Imaging   MRI Abdomen 12/15/19  IMPRESSION: 1. There are two large masses in the liver with appearance favoring metastatic disease or hepatocellular carcinoma or cholangiocarcinoma. A benign etiology is highly unlikely given the enhancement pattern and associated adenopathy. 2. Considerable porta hepatis and retroperitoneal adenopathy. Some of the confluent porta hepatis tumor is potentially infiltrative and abuts the pancreatic body along its right upper margin, making it difficult to completely exclude the possibility of pancreatic adenocarcinoma primary. Possibilities helpful in further workup might include tissue diagnosis, endoscopic ultrasound, or nuclear medicine PET-CT. 3. Pancreas divisum. 4. Lumbar spondylosis and degenerative disc disease. 5. Despite efforts by the technologist and patient, motion artifact is present on today's exam and could not be eliminated. This reduces exam sensitivity and specificity.   12/22/2019 Procedure   Upper Endoscopy by Dr Rollin 12/22/19  IMPRESSION - One lymph node was visualized and measured in the porta hepatis region. Fine needle aspiration performed    12/22/2019 Initial Biopsy   A. LIVER, PORTA HEPATIS MASS, FINE NEEDLE 12/22/19 ASPIRATION:  FINAL MICROSCOPIC DIAGNOSIS:  - Malignant cells consistent with metastatic adenocarcinoma   01/01/2020 Initial Diagnosis   Intrahepatic cholangiocarcinoma (HCC)   01/08/2020 Initial Biopsy   FINAL MICROSCOPIC DIAGNOSIS:   A. LIVER, LEFT LOBE, BIOPSY:  - Metastatic adenocarcinoma, consistent with a colorectal primary.  See   comment      COMMENT:   Immunohistochemical stains show the tumor cells are positive for CK20  and CDX2 but negative for CK7, consistent with above interpretation.  Dr. 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   01/24/2020 Procedure   PAC placed 01/24/20   01/25/2020 -  Chemotherapy   first line FOLFOX starting 01/25/2020, Vextibix  added with C2 (02/06/20)   02/06/2020 - 02/06/2022 Chemotherapy   Patient is on Treatment Plan : COLORECTAL Panitumumab  q14d ([name] Wild - Type Gene Only)     06/20/2020 Imaging   CT CAP  IMPRESSION: 1. Continued interval reduction in size and conspicuity of a subsolid nodule of the peripheral left upper lobe. 2. Unchanged prominent pretracheal and subcarinal lymph nodes. 3. Redemonstrated partially calcified low-attenuation liver masses, slightly decreased in size compared to prior examination. 4. Slight interval decrease in size of a portacaval lymph node or conglomerate and retroperitoneal lymph nodes. 5. Findings are consistent with continued treatment response of nodal, pulmonary, and hepatic metastatic disease. 6. Coronary artery disease.   Aortic Atherosclerosis (ICD10-I70.0).     10/09/2020 Imaging   IMPRESSION: 1. Slight decrease in size of dominant liver mass with smaller liver mass within 1-2 mm of prior measurement. 2. Stable appearance of celiac lymph and retroperitoneal lymph nodes. Dominant node with calcification in the gastrohepatic ligament as described. 3. Continued decrease in size of LEFT upper lobe nodule. 4. Three-vessel coronary artery calcification. 5. Aortic atherosclerosis.   03/25/2022 - 07/16/2023 Chemotherapy   Patient is on Treatment Plan : COLORECTAL FOLFIRI + Bevacizumab  q14d     08/31/2022 Imaging    IMPRESSION: 1. Unchanged, densely calcified liver  lesions, consistent  with treated metastatic disease. 2. Unchanged, irregular subpleural opacity of the peripheral left upper lobe. 3. Unchanged subcentimeter gastrohepatic ligament lymph node. 4. No evidence of new metastatic disease in the chest, abdomen, or pelvis. 5. Status post hysterectomy. 6. Coronary artery disease. 7. Aortic valve calcifications. Correlate for echocardiographic evidence of aortic valve dysfunction.     06/22/2023 Imaging   CT Chest, Abdomen, and pelvis with contrast  IMPRESSION: Centrally calcified liver lesions are again seen and slightly increased today. Prominent nodes in the upper retroperitoneum are overall similar. Attention on follow-up.   Stable branching left upper lobe lung nodule.   08/11/2023 - 01/11/2024 Chemotherapy   Patient is on Treatment Plan : COLORECTAL FOLFIRI + Panitumumab  q14d     02/09/2024 -  Chemotherapy   Patient is on Treatment Plan : COLORECTAL Bevacizumab  + Trifluridine /Tipiracil  q28d        Discussed the use of AI scribe software for clinical note transcription with the patient, who gave verbal consent to proceed.  History of Present Illness Lindsay [name] is a 69 year old female with metastatic colon cancer with hepatic metastases who presents for follow-up of her disease and ongoing chemotherapy.  She is in the second month of an oral chemotherapy regimen (one week on, one week off) and is tolerating treatment without significant adverse effects, noting only one brief episode of abdominal pain that resolved spontaneously. Her tumor marker decreased from 600 to 300 since the last assessment. CT obtained prior to this regimen showed interval growth of the hepatic lesion from 8.4 cm to 10.7 cm.  She reports increased fatigue and exertional dyspnea with need to rest after activity. Her most recent hemoglobin is 8.5 g/dL.  She was seen in the emergency department on March 10, 2024, for difficulty lying down and was diagnosed with a urinary tract  infection that did not require admission.     All other systems were reviewed with the patient and are negative.  MEDICAL HISTORY:  Past Medical History:  Diagnosis Date   Arthritis    Diabetes mellitus without complication (HCC)    Hypertension    met colon ca to liver 01/2020    SURGICAL HISTORY: Past Surgical History:  Procedure Laterality Date   ABDOMINAL HYSTERECTOMY     ANTERIOR AND POSTERIOR REPAIR WITH SACROSPINOUS FIXATION N/A 07/16/2016   Procedure: ANTERIOR AND POSTERIOR REPAIR WITH SACROSPINOUS FIXATION;  Surgeon: Curlene Agent, MD;  Location: [facility];  Service: Gynecology;  Laterality: N/A;   CYSTOSCOPY  07/16/2016   Procedure: CYSTOSCOPY;  Surgeon: Curlene Agent, MD;  Location: [facility];  Service: Gynecology;;   ESOPHAGOGASTRODUODENOSCOPY (EGD) WITH PROPOFOL  N/A 12/22/2019   Procedure: ESOPHAGOGASTRODUODENOSCOPY (EGD) WITH PROPOFOL ;  Surgeon: [name], MD;  Location: [facility];  Service: Endoscopy;  Laterality: N/A;   FINE NEEDLE ASPIRATION N/A 12/22/2019   Procedure: FINE NEEDLE ASPIRATION (FNA) LINEAR;  Surgeon: [name], MD;  Location: [facility];  Service: Endoscopy;  Laterality: N/A;   IR IMAGING GUIDED [name] INSERTION  01/24/2020   IR IMAGING GUIDED [name] INSERTION  01/14/2024   IR REMOVAL TUN ACCESS W/ [name] W/O FL MOD SED  07/30/2023   LAPAROSCOPIC VAGINAL HYSTERECTOMY WITH SALPINGECTOMY Bilateral 07/16/2016   Procedure: LAPAROSCOPIC ASSISTED VAGINAL HYSTERECTOMY WITH SALPINGECTOMY;  Surgeon: Curlene Agent, MD;  Location: [facility];  Service: Gynecology;  Laterality: Bilateral;   LEFT HEART CATH AND CORONARY ANGIOGRAPHY N/A 05/07/2023   Procedure: LEFT HEART CATH AND CORONARY ANGIOGRAPHY;  Surgeon: Anner Alm ORN, MD;  Location: [facility];  Service: Cardiovascular;  Laterality: N/A;   MYOMECTOMY ABDOMINAL APPROACH     THYROID SURGERY     tyroid     UPPER ESOPHAGEAL ENDOSCOPIC ULTRASOUND (EUS) N/A 12/22/2019   Procedure: UPPER ESOPHAGEAL  ENDOSCOPIC ULTRASOUND (EUS);  Surgeon: [name], MD;  Location: [facility];  Service: Endoscopy;  Laterality: N/A;    I have reviewed the social history and family history with the patient and they are unchanged from previous note.  ALLERGIES:  is allergic to oxaliplatin .  MEDICATIONS:  Current Outpatient Medications  Medication Sig Dispense Refill   aspirin  EC 81 MG tablet Take 1 tablet (81 mg total) by mouth daily. Swallow whole. 90 tablet 0   atorvastatin  (LIPITOR) 80 MG tablet Take 1 tablet (80 mg total) by mouth daily. 90 tablet 0   cephALEXin  (KEFLEX ) 500 MG capsule Take 1 capsule (500 mg total) by mouth 4 (four) times daily. 28 capsule 0   clindamycin  (CLEOCIN -T) 1 % lotion Apply topically as needed. 60 mL 0   clopidogrel  (PLAVIX ) 75 MG tablet Take 1 tablet (75 mg total) by mouth daily. 90 tablet 0   doxycycline  (VIBRA -TABS) 100 MG tablet Take 1 tablet (100 mg total) by mouth 2 (two) times daily. 60 tablet 3   ezetimibe  (ZETIA ) 10 MG tablet Take 1 tablet (10 mg total) by mouth daily. 90 tablet 3   isosorbide  mononitrate (IMDUR ) 30 MG 24 hr tablet Take 1 tablet (30 mg total) by mouth every evening. 90 tablet 3   MAGNESIUM -OXIDE 400 (240 Mg) MG tablet Take 1 tablet by mouth twice daily 60 tablet 0   metoprolol  succinate (TOPROL  XL) 25 MG 24 hr tablet Take 1 tablet (25 mg total) by mouth daily. Hold if systolic BP (top number) is less than 100 and/or heart rate is less than 55 90 tablet 3   nitroGLYCERIN  (NITROSTAT ) 0.4 MG SL tablet Place 1 tablet (0.4 mg total) under the tongue every 5 (five) minutes as needed for chest pain. 75 tablet 3   POTASSIUM CHLORIDE  PO Take by mouth every other day.     prochlorperazine  (COMPAZINE ) 10 MG tablet Take 1 tablet (10 mg total) by mouth every 6 (six) hours as needed for nausea or vomiting. 90 tablet 0   ranolazine  (RANEXA ) 500 MG 12 hr tablet Take 1 tablet (500 mg total) by mouth 2 (two) times daily. 180 tablet 3   trifluridine -tipiracil   (LONSURF ) 20-8.19 MG tablet Take 3 tablets (60 mg of trifluridine  total) by mouth 2 (two) times daily after a meal. Take within 1 hr after AM & PM meals on days 1-5, and 15-19. Repeat every 28 days. 60 tablet 1   No current facility-administered medications for this visit.    PHYSICAL EXAMINATION: ECOG PERFORMANCE STATUS: 1 - Symptomatic but completely ambulatory  Vitals:   03/21/24 0921  BP: 134/78  Pulse: 93  Resp: 17  Temp: (!) 97.4 F (36.3 C)  SpO2: 98%   Wt Readings from Last 3 Encounters:  03/21/24 176 lb 8 oz (80.1 kg)  03/07/24 173 lb 11.2 oz (78.8 kg)  02/23/24 174 lb (78.9 kg)     GENERAL:alert, no distress and comfortable SKIN: skin color, texture, turgor are normal, no rashes or significant lesions EYES: normal, Conjunctiva are pink and non-injected, sclera clear NECK: supple, thyroid normal size, non-tender, without nodularity LYMPH:  no palpable lymphadenopathy in the cervical, axillary  LUNGS: clear to auscultation and percussion with normal breathing effort HEART: regular rate & rhythm and no murmurs  and no lower extremity edema ABDOMEN:abdomen soft, non-tender and normal bowel sounds Musculoskeletal:no cyanosis of digits and no clubbing  NEURO: alert & oriented x 3 with fluent speech, no focal motor/sensory deficits  Physical Exam    LABORATORY DATA:  I have reviewed the data as listed    Latest Ref Rng & Units 03/21/2024    8:56 AM 03/10/2024    2:50 PM 03/07/2024   12:35 PM  CBC  WBC 4.0 - 10.5 K/uL 6.5  8.3  5.3   Hemoglobin 12.0 - 15.0 g/dL 8.5  8.6  9.2   Hematocrit 36.0 - 46.0 % 26.6  26.4  28.3   Platelets 150 - 400 K/uL 419  414  525         Latest Ref Rng & Units 03/21/2024    8:56 AM 03/10/2024    2:50 PM 03/07/2024   12:35 PM  CMP  Glucose 70 - 99 mg/dL 863  878  845   BUN 8 - 23 mg/dL 6  7  6    Creatinine 0.44 - 1.00 mg/dL 9.44  9.53  9.52   Sodium 135 - 145 mmol/L 141  134  137   Potassium 3.5 - 5.1 mmol/L 3.8  3.6  3.6    Chloride 98 - 111 mmol/L 102  99  101   CO2 22 - 32 mmol/L 27  25  26    Calcium  8.9 - 10.3 mg/dL 9.1  8.6  9.2   Total Protein 6.5 - 8.1 g/dL 7.1   7.3   Total Bilirubin 0.0 - 1.2 mg/dL 0.4   0.4   Alkaline Phos 38 - 126 U/L 181   237   AST 15 - 41 U/L 25   37   ALT 0 - 44 U/L 18   55       RADIOGRAPHIC STUDIES: I have personally reviewed the radiological images as listed and agreed with the findings in the report. No results found.    No orders of the defined types were placed in this encounter.  All questions were answered. The patient knows to call the clinic with any problems, questions or concerns. No barriers to learning was detected. The total time spent in the appointment was 30 minutes, including review of chart and various tests results, discussions about plan of care and coordination of care plan     Onita Mattock, MD 03/21/2024     "

## 2024-03-24 ENCOUNTER — Encounter: Payer: Self-pay | Admitting: Nurse Practitioner

## 2024-03-24 ENCOUNTER — Encounter: Payer: Self-pay | Admitting: Hematology

## 2024-03-29 ENCOUNTER — Other Ambulatory Visit (HOSPITAL_COMMUNITY): Payer: Self-pay

## 2024-04-03 ENCOUNTER — Telehealth: Payer: Self-pay | Admitting: Hematology

## 2024-04-03 NOTE — Telephone Encounter (Signed)
 Rescheduled appointments due to cancer center opening up at 10am 1/27. Called and left a VM with the changes made to her upcoming appointments.

## 2024-04-04 ENCOUNTER — Inpatient Hospital Stay: Payer: Medicare (Managed Care)

## 2024-04-04 ENCOUNTER — Inpatient Hospital Stay: Payer: Medicare (Managed Care) | Admitting: Hematology

## 2024-04-06 ENCOUNTER — Other Ambulatory Visit: Payer: Self-pay

## 2024-04-07 ENCOUNTER — Other Ambulatory Visit: Payer: Self-pay

## 2024-04-07 NOTE — Progress Notes (Signed)
 Specialty Pharmacy Refill Coordination Note  Lindsay [name] is a 69 y.o. female contacted today regarding refills of specialty medication(s) Trifluridine -Tipiracil  (Lonsurf )   Patient requested Delivery   Delivery date: 04/13/24   Verified address: [address] [place] [address]   Medication will be filled on: 04/12/24

## 2024-04-11 ENCOUNTER — Inpatient Hospital Stay: Payer: Medicare (Managed Care)

## 2024-04-11 ENCOUNTER — Other Ambulatory Visit: Payer: Self-pay | Admitting: Hematology

## 2024-04-11 ENCOUNTER — Inpatient Hospital Stay: Payer: Medicare (Managed Care) | Admitting: Nurse Practitioner

## 2024-04-11 DIAGNOSIS — C221 Intrahepatic bile duct carcinoma: Secondary | ICD-10-CM

## 2024-04-12 ENCOUNTER — Other Ambulatory Visit: Payer: Self-pay | Admitting: Cardiology

## 2024-04-12 ENCOUNTER — Other Ambulatory Visit: Payer: Self-pay

## 2024-04-12 DIAGNOSIS — R072 Precordial pain: Secondary | ICD-10-CM

## 2024-04-12 DIAGNOSIS — I25118 Atherosclerotic heart disease of native coronary artery with other forms of angina pectoris: Secondary | ICD-10-CM

## 2024-04-18 ENCOUNTER — Inpatient Hospital Stay: Payer: Medicare (Managed Care) | Admitting: Hematology

## 2024-04-18 ENCOUNTER — Inpatient Hospital Stay: Payer: Medicare (Managed Care) | Attending: Hematology

## 2024-04-18 ENCOUNTER — Inpatient Hospital Stay: Payer: Medicare (Managed Care)

## 2024-04-25 ENCOUNTER — Inpatient Hospital Stay: Payer: Medicare (Managed Care) | Admitting: Hematology

## 2024-04-25 ENCOUNTER — Inpatient Hospital Stay: Payer: Medicare (Managed Care)

## 2024-05-02 ENCOUNTER — Inpatient Hospital Stay: Payer: Medicare (Managed Care)

## 2024-05-02 ENCOUNTER — Inpatient Hospital Stay: Payer: Medicare (Managed Care) | Admitting: Hematology

## 2024-05-09 ENCOUNTER — Inpatient Hospital Stay: Payer: Medicare (Managed Care)

## 2024-05-09 ENCOUNTER — Inpatient Hospital Stay: Payer: Medicare (Managed Care) | Admitting: Hematology

## 2024-05-16 ENCOUNTER — Inpatient Hospital Stay: Payer: Medicare (Managed Care)

## 2024-05-16 ENCOUNTER — Inpatient Hospital Stay: Payer: Medicare (Managed Care) | Attending: Hematology

## 2024-05-16 ENCOUNTER — Inpatient Hospital Stay: Payer: Medicare (Managed Care) | Admitting: Hematology

## 2024-05-18 ENCOUNTER — Other Ambulatory Visit (HOSPITAL_COMMUNITY): Payer: Medicare (Managed Care)

## 2024-05-23 ENCOUNTER — Inpatient Hospital Stay: Payer: Medicare (Managed Care)

## 2024-05-23 ENCOUNTER — Inpatient Hospital Stay: Payer: Medicare (Managed Care) | Admitting: Hematology

## 2024-05-30 ENCOUNTER — Inpatient Hospital Stay: Payer: Medicare (Managed Care) | Admitting: Hematology

## 2024-05-30 ENCOUNTER — Inpatient Hospital Stay: Payer: Medicare (Managed Care)
